# Patient Record
Sex: Male | Born: 1950
Health system: Southern US, Community
[De-identification: ages and names within clinical notes are randomized; demographics above are authoritative.]

## PROBLEM LIST (undated history)

## (undated) DIAGNOSIS — F418 Other specified anxiety disorders: Secondary | ICD-10-CM

## (undated) DIAGNOSIS — K760 Fatty (change of) liver, not elsewhere classified: Secondary | ICD-10-CM

## (undated) DIAGNOSIS — M1990 Unspecified osteoarthritis, unspecified site: Secondary | ICD-10-CM

## (undated) DIAGNOSIS — Z5189 Encounter for other specified aftercare: Secondary | ICD-10-CM

## (undated) DIAGNOSIS — G4730 Sleep apnea, unspecified: Secondary | ICD-10-CM

## (undated) DIAGNOSIS — Z8719 Personal history of other diseases of the digestive system: Secondary | ICD-10-CM

## (undated) DIAGNOSIS — Z86010 Personal history of colonic polyps: Secondary | ICD-10-CM

## (undated) DIAGNOSIS — E039 Hypothyroidism, unspecified: Secondary | ICD-10-CM

## (undated) DIAGNOSIS — K922 Gastrointestinal hemorrhage, unspecified: Secondary | ICD-10-CM

## (undated) DIAGNOSIS — I219 Acute myocardial infarction, unspecified: Secondary | ICD-10-CM

## (undated) DIAGNOSIS — J45909 Unspecified asthma, uncomplicated: Secondary | ICD-10-CM

## (undated) DIAGNOSIS — R748 Abnormal levels of other serum enzymes: Secondary | ICD-10-CM

## (undated) DIAGNOSIS — Z972 Presence of dental prosthetic device (complete) (partial): Secondary | ICD-10-CM

## (undated) DIAGNOSIS — I639 Cerebral infarction, unspecified: Secondary | ICD-10-CM

## (undated) DIAGNOSIS — T7840XA Allergy, unspecified, initial encounter: Secondary | ICD-10-CM

## (undated) DIAGNOSIS — K219 Gastro-esophageal reflux disease without esophagitis: Secondary | ICD-10-CM

## (undated) DIAGNOSIS — S02400A Malar fracture unspecified, initial encounter for closed fracture: Secondary | ICD-10-CM

## (undated) DIAGNOSIS — Z8711 Personal history of peptic ulcer disease: Secondary | ICD-10-CM

## (undated) DIAGNOSIS — K08109 Complete loss of teeth, unspecified cause, unspecified class: Secondary | ICD-10-CM

## (undated) DIAGNOSIS — I4891 Unspecified atrial fibrillation: Secondary | ICD-10-CM

## (undated) DIAGNOSIS — R569 Unspecified convulsions: Secondary | ICD-10-CM

## (undated) DIAGNOSIS — L259 Unspecified contact dermatitis, unspecified cause: Secondary | ICD-10-CM

## (undated) DIAGNOSIS — F419 Anxiety disorder, unspecified: Secondary | ICD-10-CM

## (undated) DIAGNOSIS — A4902 Methicillin resistant Staphylococcus aureus infection, unspecified site: Secondary | ICD-10-CM

## (undated) DIAGNOSIS — K2980 Duodenitis without bleeding: Secondary | ICD-10-CM

## (undated) DIAGNOSIS — Q2112 Patent foramen ovale: Secondary | ICD-10-CM

## (undated) DIAGNOSIS — I509 Heart failure, unspecified: Secondary | ICD-10-CM

## (undated) DIAGNOSIS — Q211 Atrial septal defect: Secondary | ICD-10-CM

## (undated) DIAGNOSIS — J189 Pneumonia, unspecified organism: Secondary | ICD-10-CM

## (undated) DIAGNOSIS — A0472 Enterocolitis due to Clostridium difficile, not specified as recurrent: Secondary | ICD-10-CM

## (undated) DIAGNOSIS — Z973 Presence of spectacles and contact lenses: Secondary | ICD-10-CM

## (undated) DIAGNOSIS — IMO0001 Reserved for inherently not codable concepts without codable children: Secondary | ICD-10-CM

## (undated) DIAGNOSIS — S02401A Maxillary fracture, unspecified, initial encounter for closed fracture: Secondary | ICD-10-CM

## (undated) HISTORY — DX: Unspecified convulsions: R56.9

## (undated) HISTORY — DX: Maxillary fracture, unspecified side, initial encounter for closed fracture: S02.401A

## (undated) HISTORY — PX: CATARACT EXTRACTION: SUR2

## (undated) HISTORY — DX: Enterocolitis due to Clostridium difficile, not specified as recurrent: A04.72

## (undated) HISTORY — DX: Unspecified asthma, uncomplicated: J45.909

## (undated) HISTORY — DX: Anxiety disorder, unspecified: F41.9

## (undated) HISTORY — DX: Hypothyroidism, unspecified: E03.9

## (undated) HISTORY — PX: OTHER SURGICAL HISTORY: SHX169

## (undated) HISTORY — DX: Unspecified contact dermatitis, unspecified cause: L25.9

## (undated) HISTORY — DX: Acute myocardial infarction, unspecified: I21.9

## (undated) HISTORY — PX: TONSILLECTOMY: SUR1361

## (undated) HISTORY — PX: REPLACEMENT TOTAL KNEE: SUR1224

## (undated) HISTORY — DX: Other specified anxiety disorders: F41.8

## (undated) HISTORY — DX: Cerebral infarction, unspecified: I63.9

## (undated) HISTORY — PX: MULTIPLE TOOTH EXTRACTIONS: SHX2053

## (undated) HISTORY — DX: Duodenitis without bleeding: K29.80

## (undated) HISTORY — DX: Malar fracture, unspecified side, initial encounter for closed fracture: S02.400A

## (undated) HISTORY — PX: JOINT REPLACEMENT: SHX530

## (undated) HISTORY — DX: Unspecified atrial fibrillation: I48.91

## (undated) HISTORY — DX: Allergy, unspecified, initial encounter: T78.40XA

## (undated) HISTORY — DX: Heart failure, unspecified: I50.9

## (undated) HISTORY — PX: INGUINAL HERNIA REPAIR: SUR1180

## (undated) HISTORY — DX: Personal history of colonic polyps: Z86.010

---

## 1998-06-22 ENCOUNTER — Ambulatory Visit (HOSPITAL_COMMUNITY): Admission: RE | Admit: 1998-06-22 | Discharge: 1998-06-22 | Payer: Self-pay | Admitting: Family Medicine

## 1999-10-30 ENCOUNTER — Encounter: Payer: Self-pay | Admitting: Family Medicine

## 1999-10-30 ENCOUNTER — Ambulatory Visit (HOSPITAL_COMMUNITY): Admission: RE | Admit: 1999-10-30 | Discharge: 1999-10-30 | Payer: Self-pay | Admitting: Family Medicine

## 1999-11-03 ENCOUNTER — Encounter: Payer: Self-pay | Admitting: Family Medicine

## 1999-11-03 ENCOUNTER — Ambulatory Visit (HOSPITAL_COMMUNITY): Admission: RE | Admit: 1999-11-03 | Discharge: 1999-11-03 | Payer: Self-pay | Admitting: Family Medicine

## 2001-09-18 ENCOUNTER — Inpatient Hospital Stay (HOSPITAL_COMMUNITY): Admission: RE | Admit: 2001-09-18 | Discharge: 2001-09-23 | Payer: Self-pay | Admitting: Orthopedic Surgery

## 2001-09-18 ENCOUNTER — Encounter: Payer: Self-pay | Admitting: Orthopedic Surgery

## 2001-10-15 HISTORY — PX: KNEE ARTHROSCOPY: SHX127

## 2001-11-04 ENCOUNTER — Inpatient Hospital Stay (HOSPITAL_COMMUNITY): Admission: RE | Admit: 2001-11-04 | Discharge: 2001-11-06 | Payer: Self-pay | Admitting: Orthopedic Surgery

## 2001-11-04 ENCOUNTER — Encounter: Payer: Self-pay | Admitting: Orthopedic Surgery

## 2001-11-07 ENCOUNTER — Encounter: Payer: Self-pay | Admitting: Emergency Medicine

## 2001-11-07 ENCOUNTER — Emergency Department (HOSPITAL_COMMUNITY): Admission: EM | Admit: 2001-11-07 | Discharge: 2001-11-07 | Payer: Self-pay | Admitting: Emergency Medicine

## 2001-11-07 ENCOUNTER — Encounter: Payer: Self-pay | Admitting: Orthopedic Surgery

## 2003-05-10 ENCOUNTER — Ambulatory Visit (HOSPITAL_COMMUNITY): Admission: RE | Admit: 2003-05-10 | Discharge: 2003-05-10 | Payer: Self-pay | Admitting: *Deleted

## 2003-05-10 ENCOUNTER — Encounter: Payer: Self-pay | Admitting: *Deleted

## 2003-06-01 ENCOUNTER — Ambulatory Visit (HOSPITAL_COMMUNITY): Admission: RE | Admit: 2003-06-01 | Discharge: 2003-06-01 | Payer: Self-pay | Admitting: Internal Medicine

## 2003-06-01 ENCOUNTER — Encounter (INDEPENDENT_AMBULATORY_CARE_PROVIDER_SITE_OTHER): Payer: Self-pay

## 2003-06-01 DIAGNOSIS — Z860100 Personal history of colon polyps, unspecified: Secondary | ICD-10-CM

## 2003-06-01 HISTORY — DX: Personal history of colonic polyps: Z86.010

## 2003-06-01 HISTORY — DX: Personal history of colon polyps, unspecified: Z86.0100

## 2004-12-07 ENCOUNTER — Emergency Department (HOSPITAL_COMMUNITY): Admission: EM | Admit: 2004-12-07 | Discharge: 2004-12-07 | Payer: Self-pay | Admitting: Family Medicine

## 2004-12-20 ENCOUNTER — Emergency Department (HOSPITAL_COMMUNITY): Admission: EM | Admit: 2004-12-20 | Discharge: 2004-12-20 | Payer: Self-pay | Admitting: Family Medicine

## 2005-02-28 ENCOUNTER — Emergency Department (HOSPITAL_COMMUNITY): Admission: EM | Admit: 2005-02-28 | Discharge: 2005-02-28 | Payer: Self-pay | Admitting: Family Medicine

## 2005-06-11 ENCOUNTER — Emergency Department (HOSPITAL_COMMUNITY): Admission: EM | Admit: 2005-06-11 | Discharge: 2005-06-11 | Payer: Self-pay | Admitting: Emergency Medicine

## 2005-06-15 HISTORY — PX: SYNOVECTOMY: SHX133

## 2005-06-24 ENCOUNTER — Emergency Department (HOSPITAL_COMMUNITY): Admission: EM | Admit: 2005-06-24 | Discharge: 2005-06-24 | Payer: Self-pay | Admitting: Emergency Medicine

## 2005-07-03 ENCOUNTER — Ambulatory Visit: Payer: Self-pay | Admitting: Infectious Diseases

## 2005-07-03 ENCOUNTER — Inpatient Hospital Stay (HOSPITAL_COMMUNITY): Admission: AD | Admit: 2005-07-03 | Discharge: 2005-07-09 | Payer: Self-pay | Admitting: Orthopaedic Surgery

## 2005-12-06 ENCOUNTER — Encounter: Admission: RE | Admit: 2005-12-06 | Discharge: 2006-03-06 | Payer: Self-pay | Admitting: Orthopedic Surgery

## 2006-01-30 ENCOUNTER — Ambulatory Visit: Payer: Self-pay | Admitting: Internal Medicine

## 2006-01-31 ENCOUNTER — Ambulatory Visit (HOSPITAL_COMMUNITY): Admission: RE | Admit: 2006-01-31 | Discharge: 2006-01-31 | Payer: Self-pay | Admitting: Internal Medicine

## 2006-02-28 ENCOUNTER — Ambulatory Visit: Payer: Self-pay | Admitting: Internal Medicine

## 2006-04-05 ENCOUNTER — Inpatient Hospital Stay (HOSPITAL_COMMUNITY): Admission: RE | Admit: 2006-04-05 | Discharge: 2006-04-11 | Payer: Self-pay | Admitting: Orthopedic Surgery

## 2006-04-08 ENCOUNTER — Ambulatory Visit: Payer: Self-pay | Admitting: Infectious Diseases

## 2006-04-10 ENCOUNTER — Ambulatory Visit: Payer: Self-pay | Admitting: Physical Medicine & Rehabilitation

## 2006-04-11 ENCOUNTER — Inpatient Hospital Stay (HOSPITAL_COMMUNITY)
Admission: RE | Admit: 2006-04-11 | Discharge: 2006-04-16 | Payer: Self-pay | Admitting: Physical Medicine & Rehabilitation

## 2006-04-14 HISTORY — PX: HARDWARE REMOVAL: SHX979

## 2006-05-30 ENCOUNTER — Ambulatory Visit: Payer: Self-pay | Admitting: Internal Medicine

## 2006-06-25 ENCOUNTER — Encounter (INDEPENDENT_AMBULATORY_CARE_PROVIDER_SITE_OTHER): Payer: Self-pay | Admitting: *Deleted

## 2006-06-25 ENCOUNTER — Inpatient Hospital Stay (HOSPITAL_COMMUNITY): Admission: RE | Admit: 2006-06-25 | Discharge: 2006-07-01 | Payer: Self-pay | Admitting: Orthopedic Surgery

## 2006-06-26 ENCOUNTER — Ambulatory Visit: Payer: Self-pay | Admitting: Physical Medicine & Rehabilitation

## 2006-07-22 ENCOUNTER — Encounter: Admission: RE | Admit: 2006-07-22 | Discharge: 2006-10-20 | Payer: Self-pay | Admitting: Orthopedic Surgery

## 2006-10-11 ENCOUNTER — Ambulatory Visit: Payer: Self-pay | Admitting: Internal Medicine

## 2006-10-16 ENCOUNTER — Ambulatory Visit: Payer: Self-pay | Admitting: *Deleted

## 2006-11-08 ENCOUNTER — Ambulatory Visit: Payer: Self-pay | Admitting: Internal Medicine

## 2006-11-19 ENCOUNTER — Ambulatory Visit: Payer: Self-pay | Admitting: Internal Medicine

## 2007-06-09 DIAGNOSIS — M1990 Unspecified osteoarthritis, unspecified site: Secondary | ICD-10-CM | POA: Insufficient documentation

## 2007-06-09 DIAGNOSIS — M19011 Primary osteoarthritis, right shoulder: Secondary | ICD-10-CM | POA: Insufficient documentation

## 2007-07-02 ENCOUNTER — Encounter (INDEPENDENT_AMBULATORY_CARE_PROVIDER_SITE_OTHER): Payer: Self-pay | Admitting: *Deleted

## 2007-08-15 ENCOUNTER — Encounter: Admission: RE | Admit: 2007-08-15 | Discharge: 2007-08-15 | Payer: Self-pay | Admitting: Orthopaedic Surgery

## 2007-10-15 ENCOUNTER — Ambulatory Visit: Payer: Self-pay | Admitting: Internal Medicine

## 2007-10-15 LAB — CONVERTED CEMR LAB
BUN: 11 mg/dL (ref 6–23)
Basophils Relative: 1 % (ref 0–1)
Calcium: 9.3 mg/dL (ref 8.4–10.5)
Cholesterol: 150 mg/dL (ref 0–200)
Eosinophils Absolute: 0.3 10*3/uL (ref 0.0–0.7)
HDL: 49 mg/dL (ref >39)
Hgb A1c MFr Bld: 5.3 % (ref 4.6–6.1)
LDL Cholesterol: 77 mg/dL (ref 0–99)
MCHC: 33.6 g/dL (ref 30.0–36.0)
MCV: 91.2 fL (ref 78.0–100.0)
Monocytes Relative: 13 % — ABNORMAL HIGH (ref 3–12)
Neutrophils Relative %: 68 % (ref 43–77)
Platelets: 297 10*3/uL (ref 150–400)
Potassium: 4.2 meq/L (ref 3.5–5.3)
Sodium: 140 meq/L (ref 135–145)
Total CHOL/HDL Ratio: 3.1
Triglycerides: 118 mg/dL (ref <150)

## 2007-10-16 DIAGNOSIS — R569 Unspecified convulsions: Secondary | ICD-10-CM

## 2007-10-16 HISTORY — DX: Unspecified convulsions: R56.9

## 2007-11-11 ENCOUNTER — Ambulatory Visit: Payer: Self-pay | Admitting: Internal Medicine

## 2008-02-10 ENCOUNTER — Ambulatory Visit: Payer: Self-pay | Admitting: Internal Medicine

## 2008-03-02 ENCOUNTER — Ambulatory Visit: Payer: Self-pay | Admitting: Internal Medicine

## 2008-03-19 ENCOUNTER — Ambulatory Visit: Payer: Self-pay | Admitting: Internal Medicine

## 2008-04-09 ENCOUNTER — Ambulatory Visit: Payer: Self-pay | Admitting: Internal Medicine

## 2008-04-09 LAB — CONVERTED CEMR LAB
BUN: 11 mg/dL (ref 6–23)
Calcium: 8.5 mg/dL (ref 8.4–10.5)
Glucose, Bld: 80 mg/dL (ref 70–99)
Sodium: 138 meq/L (ref 135–145)

## 2008-05-11 ENCOUNTER — Ambulatory Visit: Payer: Self-pay | Admitting: Internal Medicine

## 2008-08-15 DIAGNOSIS — J189 Pneumonia, unspecified organism: Secondary | ICD-10-CM

## 2008-08-15 HISTORY — PX: ARTHROTOMY: SHX134

## 2008-08-15 HISTORY — PX: TOE AMPUTATION: SHX809

## 2008-08-15 HISTORY — DX: Pneumonia, unspecified organism: J18.9

## 2008-08-29 ENCOUNTER — Ambulatory Visit: Payer: Self-pay | Admitting: Cardiology

## 2008-08-29 ENCOUNTER — Inpatient Hospital Stay (HOSPITAL_COMMUNITY): Admission: EM | Admit: 2008-08-29 | Discharge: 2008-09-29 | Payer: Self-pay | Admitting: Emergency Medicine

## 2008-08-29 ENCOUNTER — Ambulatory Visit: Payer: Self-pay | Admitting: Pulmonary Disease

## 2008-08-30 ENCOUNTER — Encounter (INDEPENDENT_AMBULATORY_CARE_PROVIDER_SITE_OTHER): Payer: Self-pay | Admitting: Internal Medicine

## 2008-08-30 ENCOUNTER — Ambulatory Visit: Payer: Self-pay | Admitting: *Deleted

## 2008-09-02 ENCOUNTER — Encounter: Payer: Self-pay | Admitting: Cardiology

## 2008-09-05 ENCOUNTER — Encounter (INDEPENDENT_AMBULATORY_CARE_PROVIDER_SITE_OTHER): Payer: Self-pay | Admitting: Internal Medicine

## 2008-09-06 ENCOUNTER — Ambulatory Visit: Payer: Self-pay | Admitting: Internal Medicine

## 2008-09-07 ENCOUNTER — Encounter (INDEPENDENT_AMBULATORY_CARE_PROVIDER_SITE_OTHER): Payer: Self-pay | Admitting: Orthopedic Surgery

## 2008-09-18 ENCOUNTER — Encounter (INDEPENDENT_AMBULATORY_CARE_PROVIDER_SITE_OTHER): Payer: Self-pay | Admitting: Internal Medicine

## 2008-09-27 ENCOUNTER — Ambulatory Visit: Payer: Self-pay | Admitting: Physical Medicine & Rehabilitation

## 2008-10-15 DIAGNOSIS — I639 Cerebral infarction, unspecified: Secondary | ICD-10-CM

## 2008-10-15 HISTORY — DX: Cerebral infarction, unspecified: I63.9

## 2008-10-20 ENCOUNTER — Ambulatory Visit (HOSPITAL_COMMUNITY): Admission: RE | Admit: 2008-10-20 | Discharge: 2008-10-20 | Payer: Self-pay | Admitting: Internal Medicine

## 2008-11-21 ENCOUNTER — Emergency Department (HOSPITAL_COMMUNITY): Admission: EM | Admit: 2008-11-21 | Discharge: 2008-11-21 | Payer: Self-pay | Admitting: Family Medicine

## 2008-11-23 ENCOUNTER — Inpatient Hospital Stay (HOSPITAL_COMMUNITY): Admission: EM | Admit: 2008-11-23 | Discharge: 2008-11-30 | Payer: Self-pay | Admitting: Emergency Medicine

## 2008-12-07 ENCOUNTER — Inpatient Hospital Stay (HOSPITAL_COMMUNITY): Admission: RE | Admit: 2008-12-07 | Discharge: 2008-12-14 | Payer: Self-pay | Admitting: Orthopedic Surgery

## 2008-12-09 ENCOUNTER — Encounter (INDEPENDENT_AMBULATORY_CARE_PROVIDER_SITE_OTHER): Payer: Self-pay | Admitting: Gastroenterology

## 2008-12-11 ENCOUNTER — Ambulatory Visit: Payer: Self-pay | Admitting: Infectious Diseases

## 2009-03-15 HISTORY — PX: OTHER SURGICAL HISTORY: SHX169

## 2009-03-15 HISTORY — PX: KNEE FUSION: SUR623

## 2009-03-22 ENCOUNTER — Ambulatory Visit: Payer: Self-pay | Admitting: Internal Medicine

## 2009-03-29 ENCOUNTER — Ambulatory Visit: Payer: Self-pay | Admitting: Pulmonary Disease

## 2009-03-29 ENCOUNTER — Inpatient Hospital Stay (HOSPITAL_COMMUNITY): Admission: RE | Admit: 2009-03-29 | Discharge: 2009-04-03 | Payer: Self-pay | Admitting: Orthopedic Surgery

## 2009-04-21 ENCOUNTER — Ambulatory Visit: Payer: Self-pay | Admitting: Internal Medicine

## 2009-04-21 LAB — CONVERTED CEMR LAB
ALT: 8 units/L (ref 0–53)
AST: 11 units/L (ref 0–37)
Albumin: 3.9 g/dL (ref 3.5–5.2)
Alkaline Phosphatase: 81 units/L (ref 39–117)
Lymphocytes Relative: 22 % (ref 12–46)
Lymphs Abs: 1.4 10*3/uL (ref 0.7–4.0)
Neutrophils Relative %: 64 % (ref 43–77)
Platelets: 422 10*3/uL — ABNORMAL HIGH (ref 150–400)
Potassium: 4.5 meq/L (ref 3.5–5.3)
RBC: 3.77 M/uL — ABNORMAL LOW (ref 4.22–5.81)
Sed Rate: 44 mm/hr — ABNORMAL HIGH (ref 0–16)
Sodium: 139 meq/L (ref 135–145)
Total Protein: 7.3 g/dL (ref 6.0–8.3)
WBC: 6.3 10*3/uL (ref 4.0–10.5)

## 2009-04-26 ENCOUNTER — Ambulatory Visit: Payer: Self-pay | Admitting: Internal Medicine

## 2009-07-12 ENCOUNTER — Telehealth (INDEPENDENT_AMBULATORY_CARE_PROVIDER_SITE_OTHER): Payer: Self-pay | Admitting: *Deleted

## 2009-07-12 ENCOUNTER — Ambulatory Visit: Payer: Self-pay | Admitting: Internal Medicine

## 2009-07-15 HISTORY — PX: OTHER SURGICAL HISTORY: SHX169

## 2009-07-19 ENCOUNTER — Ambulatory Visit (HOSPITAL_COMMUNITY): Admission: RE | Admit: 2009-07-19 | Discharge: 2009-07-19 | Payer: Self-pay | Admitting: Orthopedic Surgery

## 2009-10-18 ENCOUNTER — Ambulatory Visit: Payer: Self-pay | Admitting: Internal Medicine

## 2009-10-18 ENCOUNTER — Ambulatory Visit (HOSPITAL_COMMUNITY): Admission: RE | Admit: 2009-10-18 | Discharge: 2009-10-18 | Payer: Self-pay | Admitting: Internal Medicine

## 2009-10-18 LAB — CONVERTED CEMR LAB
Basophils Relative: 1 % (ref 0–1)
HCT: 42.4 % (ref 39.0–52.0)
Hemoglobin: 14.1 g/dL (ref 13.0–17.0)
MCHC: 33.3 g/dL (ref 30.0–36.0)
Monocytes Absolute: 0.9 10*3/uL (ref 0.1–1.0)
Monocytes Relative: 11 % (ref 3–12)
Neutro Abs: 6 10*3/uL (ref 1.7–7.7)
RBC: 4.62 M/uL (ref 4.22–5.81)
Retic Ct Pct: 1.3 % (ref 0.4–3.1)
TIBC: 279 ug/dL (ref 215–435)

## 2010-01-17 ENCOUNTER — Encounter: Admission: RE | Admit: 2010-01-17 | Discharge: 2010-01-17 | Payer: Self-pay | Admitting: Orthopaedic Surgery

## 2010-02-06 ENCOUNTER — Encounter: Admission: RE | Admit: 2010-02-06 | Discharge: 2010-03-20 | Payer: Self-pay | Admitting: Orthopaedic Surgery

## 2010-02-22 ENCOUNTER — Ambulatory Visit: Payer: Self-pay | Admitting: Internal Medicine

## 2010-02-28 ENCOUNTER — Encounter: Admission: RE | Admit: 2010-02-28 | Discharge: 2010-02-28 | Payer: Self-pay | Admitting: Internal Medicine

## 2010-06-26 ENCOUNTER — Encounter (INDEPENDENT_AMBULATORY_CARE_PROVIDER_SITE_OTHER): Payer: Self-pay | Admitting: *Deleted

## 2010-07-04 ENCOUNTER — Ambulatory Visit: Payer: Self-pay | Admitting: Internal Medicine

## 2010-07-04 DIAGNOSIS — IMO0002 Reserved for concepts with insufficient information to code with codable children: Secondary | ICD-10-CM | POA: Insufficient documentation

## 2010-07-04 DIAGNOSIS — Z8614 Personal history of Methicillin resistant Staphylococcus aureus infection: Secondary | ICD-10-CM

## 2010-07-04 DIAGNOSIS — A4901 Methicillin susceptible Staphylococcus aureus infection, unspecified site: Secondary | ICD-10-CM | POA: Insufficient documentation

## 2010-07-04 DIAGNOSIS — M7020 Olecranon bursitis, unspecified elbow: Secondary | ICD-10-CM | POA: Insufficient documentation

## 2010-07-04 DIAGNOSIS — M9979 Connective tissue and disc stenosis of intervertebral foramina of abdomen and other regions: Secondary | ICD-10-CM | POA: Insufficient documentation

## 2010-07-04 DIAGNOSIS — G5620 Lesion of ulnar nerve, unspecified upper limb: Secondary | ICD-10-CM | POA: Insufficient documentation

## 2010-07-04 DIAGNOSIS — L259 Unspecified contact dermatitis, unspecified cause: Secondary | ICD-10-CM | POA: Insufficient documentation

## 2010-07-04 DIAGNOSIS — S02400A Malar fracture unspecified, initial encounter for closed fracture: Secondary | ICD-10-CM | POA: Insufficient documentation

## 2010-07-04 DIAGNOSIS — A0472 Enterocolitis due to Clostridium difficile, not specified as recurrent: Secondary | ICD-10-CM

## 2010-07-04 DIAGNOSIS — Z96651 Presence of right artificial knee joint: Secondary | ICD-10-CM

## 2010-07-04 DIAGNOSIS — M5136 Other intervertebral disc degeneration, lumbar region: Secondary | ICD-10-CM | POA: Insufficient documentation

## 2010-07-04 DIAGNOSIS — Z9660 Presence of unspecified orthopedic joint implant: Secondary | ICD-10-CM | POA: Insufficient documentation

## 2010-07-04 DIAGNOSIS — S02401A Maxillary fracture, unspecified, initial encounter for closed fracture: Secondary | ICD-10-CM

## 2010-07-04 HISTORY — DX: Unspecified contact dermatitis, unspecified cause: L25.9

## 2010-07-04 HISTORY — DX: Enterocolitis due to Clostridium difficile, not specified as recurrent: A04.72

## 2010-07-04 HISTORY — DX: Maxillary fracture, unspecified side, initial encounter for closed fracture: S02.401A

## 2010-07-04 HISTORY — DX: Malar fracture, unspecified side, initial encounter for closed fracture: S02.400A

## 2010-07-13 ENCOUNTER — Encounter (INDEPENDENT_AMBULATORY_CARE_PROVIDER_SITE_OTHER): Payer: Self-pay | Admitting: Internal Medicine

## 2010-07-13 LAB — CONVERTED CEMR LAB
BUN: 12 mg/dL (ref 6–23)
CO2: 23 meq/L (ref 19–32)
Creatinine, Ser: 1.38 mg/dL (ref 0.40–1.50)
Eosinophils Relative: 7 % — ABNORMAL HIGH (ref 0–5)
Glucose, Bld: 91 mg/dL (ref 70–99)
HCT: 44.2 % (ref 39.0–52.0)
Hemoglobin: 14.5 g/dL (ref 13.0–17.0)
Hgb A1c MFr Bld: 5.6 % (ref <5.7)
Lymphocytes Relative: 18 % (ref 12–46)
Lymphs Abs: 1.4 10*3/uL (ref 0.7–4.0)
Monocytes Absolute: 1 10*3/uL (ref 0.1–1.0)
RDW: 14.3 % (ref 11.5–15.5)
Total Bilirubin: 0.9 mg/dL (ref 0.3–1.2)
WBC: 7.6 10*3/uL (ref 4.0–10.5)

## 2010-11-05 ENCOUNTER — Encounter: Payer: Self-pay | Admitting: Orthopaedic Surgery

## 2010-11-05 ENCOUNTER — Encounter: Payer: Self-pay | Admitting: Internal Medicine

## 2010-11-14 NOTE — Assessment & Plan Note (Signed)
Summary: new pt history of mrsa   History of Present Illness: [name] is a 60 year old who was referred to be by Dr. Freddrick March to discuss risk of postoperative infection given his history of previous staph infections in his right knee.  He has extensive degenerative arthritis and underwent right total knee arthroplasty in 2002.  He developed arthrofibrosis and underwent manipulation under anesthesia in 2003.  It appears he developed a methicillin sensitive staph aureus joint infection in 2006.  This was initially managed with synovectomy and poly-exchange but eventually required excision arthroplasty and spacer placement.  At some point he underwent a redo arthroplasty but had persistent infection again requiring removal of the prosthesis.  In June of last year he underwent right leg effusion.  He's had no evidence of infection since that time.  He says that he is very unhappy not been able to bend his leg and has even considered above the knee amputation so that he could be fitted with a prosthesis.  He is also considering the lower back and neck surgery but is concerned about his risk of infection.   Updated Prior Medication List: No Medications Current Allergies (reviewed today): No known allergies  Vital Signs:  Patient profile:   60 year old male Height:      74 inches (187.96 cm) Weight:      255.8 pounds (116.27 kg) BMI:     32.96 Temp:     97.6 degrees F (36.44 degrees C) oral Pulse rate:   90 / minute BP sitting:   139 / 87  (left arm) Cuff size:   large  Vitals Entered By: Jennet Maduro RN (July 04, 2010 2:07 PM)  Physical Exam  General:  alert and overweight-appearing.   Mouth:  pharynx pink and moist, no erythema, and no exudates.   Lungs:  normal breath sounds, no crackles, and no wheezes.   Heart:  normal rate, regular rhythm, and no murmur.   Abdomen:  soft, non-tender, normal bowel sounds, no hepatomegaly, and no splenomegaly.   Extremities:  he has an  extensive healed surgical incision on his anterior right leg.  His knee is fused.  There is no sign of active inflammation or infection. Skin:  he has a chronic red scaly rash on his for head and nose which he says is typical of his eczema.   Impression & Recommendations:  Problem # 1:  METHICILLIN SUSCEPTIBLE STAPH INF CCE & UNS SITE (ICD-041.11) I do not think that he has any underlying immunosuppressive disorder that puts him at unusual risk for infection.  Any surgery that he might consider in the future will carry some small risk of infection.  It may be while trying to manage his eczema more aggressively as this may predispose him to staph infections.  Otherwise I would manage him just as any other perioperative patient with standard prophylaxis, skin care and surgical technique. Orders: Consultation Level III ([id number])  Patient Instructions: 1)  Please schedule a follow-up appointment as needed.

## 2010-11-14 NOTE — Miscellaneous (Signed)
Summary: Problems updated  Clinical Lists Changes  Problems: Added new problem of MRSA (ICD-041.12) - HX of

## 2010-11-14 NOTE — Consult Note (Signed)
Summary: New Pt. Referral: Dr Laqueta Linden  New Pt. Referral: Dr Laqueta Linden   Imported By: Florinda Marker 07/10/2010 11:21:37  _____________________________________________________________________  External Attachment:    Type:   Image     Comment:   External Document

## 2011-01-18 LAB — GLUCOSE, CAPILLARY: Glucose-Capillary: 77 mg/dL (ref 70–99)

## 2011-01-19 LAB — CBC
MCHC: 34.4 g/dL (ref 30.0–36.0)
Platelets: 304 10*3/uL (ref 150–400)
RBC: 4.44 MIL/uL (ref 4.22–5.81)
WBC: 8.9 10*3/uL (ref 4.0–10.5)

## 2011-01-19 LAB — URINALYSIS, ROUTINE W REFLEX MICROSCOPIC
Glucose, UA: NEGATIVE mg/dL
Nitrite: NEGATIVE
Protein, ur: NEGATIVE mg/dL
pH: 5 (ref 5.0–8.0)

## 2011-01-19 LAB — BASIC METABOLIC PANEL
Calcium: 9.8 mg/dL (ref 8.4–10.5)
Creatinine, Ser: 1.39 mg/dL (ref 0.4–1.5)
GFR calc Af Amer: >60 mL/min (ref >60)

## 2011-01-19 LAB — URINE MICROSCOPIC-ADD ON

## 2011-01-22 LAB — TYPE AND SCREEN

## 2011-01-22 LAB — D-DIMER, QUANTITATIVE: D-Dimer, Quant: 19.06 ug/mL-FEU — ABNORMAL HIGH (ref 0.00–0.48)

## 2011-01-22 LAB — GLUCOSE, CAPILLARY
Glucose-Capillary: 103 mg/dL — ABNORMAL HIGH (ref 70–99)
Glucose-Capillary: 105 mg/dL — ABNORMAL HIGH (ref 70–99)
Glucose-Capillary: 118 mg/dL — ABNORMAL HIGH (ref 70–99)
Glucose-Capillary: 201 mg/dL — ABNORMAL HIGH (ref 70–99)
Glucose-Capillary: 69 mg/dL — ABNORMAL LOW (ref 70–99)
Glucose-Capillary: 84 mg/dL (ref 70–99)
Glucose-Capillary: 88 mg/dL (ref 70–99)
Glucose-Capillary: 89 mg/dL (ref 70–99)
Glucose-Capillary: 89 mg/dL (ref 70–99)
Glucose-Capillary: 90 mg/dL (ref 70–99)
Glucose-Capillary: 94 mg/dL (ref 70–99)
Glucose-Capillary: 95 mg/dL (ref 70–99)
Glucose-Capillary: 96 mg/dL (ref 70–99)
Glucose-Capillary: 98 mg/dL (ref 70–99)
Glucose-Capillary: 99 mg/dL (ref 70–99)

## 2011-01-22 LAB — CBC
HCT: 21.1 % — ABNORMAL LOW (ref 39.0–52.0)
HCT: 21.2 % — ABNORMAL LOW (ref 39.0–52.0)
Hemoglobin: 7.2 g/dL — CL (ref 13.0–17.0)
Hemoglobin: 7.2 g/dL — CL (ref 13.0–17.0)
Hemoglobin: 7.9 g/dL — CL (ref 13.0–17.0)
MCHC: 34 g/dL (ref 30.0–36.0)
MCHC: 34.5 g/dL (ref 30.0–36.0)
MCHC: 34.5 g/dL (ref 30.0–36.0)
MCV: 90.7 fL (ref 78.0–100.0)
MCV: 91 fL (ref 78.0–100.0)
MCV: 89.8 fL (ref 78.0–100.0)
Platelets: 198 10*3/uL (ref 150–400)
Platelets: 217 10*3/uL (ref 150–400)
Platelets: 335 10*3/uL (ref 150–400)
RBC: 2.32 MIL/uL — ABNORMAL LOW (ref 4.22–5.81)
RBC: 2.55 MIL/uL — ABNORMAL LOW (ref 4.22–5.81)
RBC: 2.57 MIL/uL — ABNORMAL LOW (ref 4.22–5.81)
RDW: 15.1 % (ref 11.5–15.5)
RDW: 15.5 % (ref 11.5–15.5)
RDW: 14.9 % (ref 11.5–15.5)
RDW: 15.3 % (ref 11.5–15.5)
WBC: 6.9 10*3/uL (ref 4.0–10.5)
WBC: 7.2 10*3/uL (ref 4.0–10.5)

## 2011-01-22 LAB — BASIC METABOLIC PANEL
BUN: 24 mg/dL — ABNORMAL HIGH (ref 6–23)
CO2: 26 mEq/L (ref 19–32)
CO2: 26 mEq/L (ref 19–32)
Calcium: 7.6 mg/dL — ABNORMAL LOW (ref 8.4–10.5)
Calcium: 7.9 mg/dL — ABNORMAL LOW (ref 8.4–10.5)
Calcium: 8 mg/dL — ABNORMAL LOW (ref 8.4–10.5)
Chloride: 109 mEq/L (ref 96–112)
Chloride: 111 mEq/L (ref 96–112)
Creatinine, Ser: 1.13 mg/dL (ref 0.4–1.5)
Creatinine, Ser: 1.33 mg/dL (ref 0.4–1.5)
GFR calc Af Amer: >60 mL/min (ref >60)
GFR calc Af Amer: >60 mL/min (ref >60)
GFR calc non Af Amer: >60 mL/min (ref >60)
GFR calc non Af Amer: >60 mL/min (ref >60)
Glucose, Bld: 111 mg/dL — ABNORMAL HIGH (ref 70–99)
Glucose, Bld: 96 mg/dL (ref 70–99)
Potassium: 3.7 mEq/L (ref 3.5–5.1)
Potassium: 4.1 mEq/L (ref 3.5–5.1)
Sodium: 139 mEq/L (ref 135–145)
Sodium: 141 mEq/L (ref 135–145)

## 2011-01-22 LAB — COMPREHENSIVE METABOLIC PANEL
ALT: 12 U/L (ref 0–53)
AST: 14 U/L (ref 0–37)
Albumin: 3.5 g/dL (ref 3.5–5.2)
Alkaline Phosphatase: 61 U/L (ref 39–117)
BUN: 24 mg/dL — ABNORMAL HIGH (ref 6–23)
CO2: 27 mEq/L (ref 19–32)
Calcium: 8.4 mg/dL (ref 8.4–10.5)
Chloride: 106 mEq/L (ref 96–112)
Creatinine, Ser: 1.14 mg/dL (ref 0.4–1.5)
GFR calc Af Amer: >60 mL/min (ref >60)
GFR calc non Af Amer: >60 mL/min (ref >60)
Glucose, Bld: 163 mg/dL — ABNORMAL HIGH (ref 70–99)
Potassium: 3.9 mEq/L (ref 3.5–5.1)
Total Bilirubin: 0.8 mg/dL (ref 0.3–1.2)
Total Protein: 7 g/dL (ref 6.0–8.3)

## 2011-01-22 LAB — URINALYSIS, ROUTINE W REFLEX MICROSCOPIC
Glucose, UA: NEGATIVE mg/dL
Hgb urine dipstick: NEGATIVE
Protein, ur: NEGATIVE mg/dL
pH: 5 (ref 5.0–8.0)

## 2011-01-22 LAB — URINE CULTURE
Colony Count: NO GROWTH
Culture: NO GROWTH

## 2011-01-22 LAB — MAGNESIUM: Magnesium: 1.8 mg/dL (ref 1.5–2.5)

## 2011-01-22 LAB — PREPARE FRESH FROZEN PLASMA

## 2011-01-22 LAB — BODY FLUID CULTURE: Culture: NO GROWTH

## 2011-01-22 LAB — PREPARE PLATELETS

## 2011-01-22 LAB — HEMOGLOBIN AND HEMATOCRIT, BLOOD: HCT: 25.5 % — ABNORMAL LOW (ref 39.0–52.0)

## 2011-01-22 LAB — DIFFERENTIAL
Basophils Absolute: 0 10*3/uL (ref 0.0–0.1)
Basophils Relative: 1 % (ref 0–1)
Eosinophils Relative: 3 % (ref 0–5)
Lymphocytes Relative: 17 % (ref 12–46)
Monocytes Absolute: 0.9 10*3/uL (ref 0.1–1.0)
Monocytes Relative: 12 % (ref 3–12)

## 2011-01-22 LAB — PHOSPHORUS
Phosphorus: 3.9 mg/dL (ref 2.3–4.6)
Phosphorus: 5.2 mg/dL — ABNORMAL HIGH (ref 2.3–4.6)

## 2011-01-22 LAB — PROTIME-INR
INR: 2.6 — ABNORMAL HIGH (ref 0.00–1.49)
INR: 3.1 — ABNORMAL HIGH (ref 0.00–1.49)

## 2011-01-22 LAB — TROPONIN I: Troponin I: <0.01 ng/mL (ref 0.00–0.06)

## 2011-01-22 LAB — POCT I-STAT 4, (NA,K, GLUC, HGB,HCT)
Glucose, Bld: 122 mg/dL — ABNORMAL HIGH (ref 70–99)
Hemoglobin: 8.2 g/dL — ABNORMAL LOW (ref 13.0–17.0)
Potassium: 4.3 mEq/L (ref 3.5–5.1)
Sodium: 139 mEq/L (ref 135–145)

## 2011-01-22 LAB — BRAIN NATRIURETIC PEPTIDE: Pro B Natriuretic peptide (BNP): 44 pg/mL (ref 0.0–100.0)

## 2011-01-22 LAB — CK TOTAL AND CKMB (NOT AT ARMC): Total CK: 110 U/L (ref 7–232)

## 2011-01-22 LAB — ANAEROBIC CULTURE

## 2011-01-22 LAB — APTT: aPTT: 39 seconds — ABNORMAL HIGH (ref 24–37)

## 2011-01-25 LAB — GLUCOSE, CAPILLARY
Glucose-Capillary: 102 mg/dL — ABNORMAL HIGH (ref 70–99)
Glucose-Capillary: 76 mg/dL (ref 70–99)
Glucose-Capillary: 81 mg/dL (ref 70–99)
Glucose-Capillary: 85 mg/dL (ref 70–99)
Glucose-Capillary: 95 mg/dL (ref 70–99)

## 2011-01-25 LAB — PROTIME-INR: Prothrombin Time: 30.5 seconds — ABNORMAL HIGH (ref 11.6–15.2)

## 2011-01-30 LAB — CBC
HCT: 28.8 % — ABNORMAL LOW (ref 39.0–52.0)
HCT: 30.9 % — ABNORMAL LOW (ref 39.0–52.0)
HCT: 24.9 % — ABNORMAL LOW (ref 39.0–52.0)
Hemoglobin: 10 g/dL — ABNORMAL LOW (ref 13.0–17.0)
Hemoglobin: 9.3 g/dL — ABNORMAL LOW (ref 13.0–17.0)
Hemoglobin: 8 g/dL — ABNORMAL LOW (ref 13.0–17.0)
Hemoglobin: 9.2 g/dL — ABNORMAL LOW (ref 13.0–17.0)
Hemoglobin: 9.7 g/dL — ABNORMAL LOW (ref 13.0–17.0)
MCHC: 32.8 g/dL (ref 30.0–36.0)
MCHC: 33.2 g/dL (ref 30.0–36.0)
MCHC: 33.3 g/dL (ref 30.0–36.0)
MCHC: 33.5 g/dL (ref 30.0–36.0)
MCHC: 34 g/dL (ref 30.0–36.0)
MCV: 89.5 fL (ref 78.0–100.0)
MCV: 89.7 fL (ref 78.0–100.0)
MCV: 89.9 fL (ref 78.0–100.0)
MCV: 90.4 fL (ref 78.0–100.0)
Platelets: 570 10*3/uL — ABNORMAL HIGH (ref 150–400)
Platelets: 720 10*3/uL — ABNORMAL HIGH (ref 150–400)
Platelets: 563 10*3/uL — ABNORMAL HIGH (ref 150–400)
RBC: 3.22 MIL/uL — ABNORMAL LOW (ref 4.22–5.81)
RBC: 3.39 MIL/uL — ABNORMAL LOW (ref 4.22–5.81)
RBC: 2.54 MIL/uL — ABNORMAL LOW (ref 4.22–5.81)
RBC: 2.77 MIL/uL — ABNORMAL LOW (ref 4.22–5.81)
RBC: 3.05 MIL/uL — ABNORMAL LOW (ref 4.22–5.81)
RBC: 3.2 MIL/uL — ABNORMAL LOW (ref 4.22–5.81)
RDW: 16.4 % — ABNORMAL HIGH (ref 11.5–15.5)
RDW: 16.7 % — ABNORMAL HIGH (ref 11.5–15.5)
RDW: 16.8 % — ABNORMAL HIGH (ref 11.5–15.5)
WBC: 10.7 10*3/uL — ABNORMAL HIGH (ref 4.0–10.5)
WBC: 6.5 10*3/uL (ref 4.0–10.5)
WBC: 6.8 10*3/uL (ref 4.0–10.5)
WBC: 7.9 10*3/uL (ref 4.0–10.5)
WBC: 8.9 10*3/uL (ref 4.0–10.5)
WBC: 9.5 10*3/uL (ref 4.0–10.5)

## 2011-01-30 LAB — BASIC METABOLIC PANEL
BUN: 18 mg/dL (ref 6–23)
BUN: 20 mg/dL (ref 6–23)
BUN: 5 mg/dL — ABNORMAL LOW (ref 6–23)
CO2: 20 mEq/L (ref 19–32)
CO2: 22 mEq/L (ref 19–32)
CO2: 24 mEq/L (ref 19–32)
CO2: 22 mEq/L (ref 19–32)
CO2: 23 mEq/L (ref 19–32)
CO2: 23 mEq/L (ref 19–32)
CO2: 25 mEq/L (ref 19–32)
Calcium: 7.4 mg/dL — ABNORMAL LOW (ref 8.4–10.5)
Calcium: 7.8 mg/dL — ABNORMAL LOW (ref 8.4–10.5)
Calcium: 7.9 mg/dL — ABNORMAL LOW (ref 8.4–10.5)
Calcium: 8.2 mg/dL — ABNORMAL LOW (ref 8.4–10.5)
Chloride: 101 mEq/L (ref 96–112)
Chloride: 111 mEq/L (ref 96–112)
Chloride: 113 mEq/L — ABNORMAL HIGH (ref 96–112)
Chloride: 113 mEq/L — ABNORMAL HIGH (ref 96–112)
Chloride: 106 mEq/L (ref 96–112)
Chloride: 108 mEq/L (ref 96–112)
Chloride: 112 mEq/L (ref 96–112)
Chloride: 113 mEq/L — ABNORMAL HIGH (ref 96–112)
Creatinine, Ser: 1.19 mg/dL (ref 0.4–1.5)
Creatinine, Ser: 1.4 mg/dL (ref 0.4–1.5)
Creatinine, Ser: 1.57 mg/dL — ABNORMAL HIGH (ref 0.4–1.5)
Creatinine, Ser: 1.6 mg/dL — ABNORMAL HIGH (ref 0.4–1.5)
Creatinine, Ser: 1.1 mg/dL (ref 0.4–1.5)
Creatinine, Ser: 1.15 mg/dL (ref 0.4–1.5)
Creatinine, Ser: 1.25 mg/dL (ref 0.4–1.5)
Creatinine, Ser: 1.31 mg/dL (ref 0.4–1.5)
GFR calc Af Amer: 54 mL/min — ABNORMAL LOW (ref >60)
GFR calc Af Amer: >60 mL/min (ref >60)
GFR calc Af Amer: >60 mL/min (ref >60)
GFR calc Af Amer: >60 mL/min (ref >60)
GFR calc Af Amer: >60 mL/min (ref >60)
GFR calc Af Amer: >60 mL/min (ref >60)
GFR calc Af Amer: >60 mL/min (ref >60)
GFR calc Af Amer: >60 mL/min (ref >60)
GFR calc non Af Amer: 45 mL/min — ABNORMAL LOW (ref >60)
GFR calc non Af Amer: 45 mL/min — ABNORMAL LOW (ref >60)
GFR calc non Af Amer: 45 mL/min — ABNORMAL LOW (ref >60)
GFR calc non Af Amer: 59 mL/min — ABNORMAL LOW (ref >60)
GFR calc non Af Amer: >60 mL/min (ref >60)
Glucose, Bld: 125 mg/dL — ABNORMAL HIGH (ref 70–99)
Glucose, Bld: 150 mg/dL — ABNORMAL HIGH (ref 70–99)
Glucose, Bld: 87 mg/dL (ref 70–99)
Potassium: 3.6 mEq/L (ref 3.5–5.1)
Potassium: 3.8 mEq/L (ref 3.5–5.1)
Potassium: 4.1 mEq/L (ref 3.5–5.1)
Potassium: 4.2 mEq/L (ref 3.5–5.1)
Potassium: 4.2 mEq/L (ref 3.5–5.1)
Sodium: 134 mEq/L — ABNORMAL LOW (ref 135–145)
Sodium: 137 mEq/L (ref 135–145)
Sodium: 138 mEq/L (ref 135–145)
Sodium: 139 mEq/L (ref 135–145)
Sodium: 139 mEq/L (ref 135–145)
Sodium: 136 mEq/L (ref 135–145)
Sodium: 136 mEq/L (ref 135–145)
Sodium: 141 mEq/L (ref 135–145)

## 2011-01-30 LAB — GLUCOSE, CAPILLARY
Glucose-Capillary: 102 mg/dL — ABNORMAL HIGH (ref 70–99)
Glucose-Capillary: 111 mg/dL — ABNORMAL HIGH (ref 70–99)
Glucose-Capillary: 115 mg/dL — ABNORMAL HIGH (ref 70–99)
Glucose-Capillary: 172 mg/dL — ABNORMAL HIGH (ref 70–99)
Glucose-Capillary: 72 mg/dL (ref 70–99)
Glucose-Capillary: 73 mg/dL (ref 70–99)
Glucose-Capillary: 75 mg/dL (ref 70–99)
Glucose-Capillary: 79 mg/dL (ref 70–99)
Glucose-Capillary: 80 mg/dL (ref 70–99)
Glucose-Capillary: 84 mg/dL (ref 70–99)
Glucose-Capillary: 87 mg/dL (ref 70–99)
Glucose-Capillary: 89 mg/dL (ref 70–99)
Glucose-Capillary: 89 mg/dL (ref 70–99)
Glucose-Capillary: 92 mg/dL (ref 70–99)
Glucose-Capillary: 94 mg/dL (ref 70–99)
Glucose-Capillary: 97 mg/dL (ref 70–99)
Glucose-Capillary: 97 mg/dL (ref 70–99)
Glucose-Capillary: 99 mg/dL (ref 70–99)
Glucose-Capillary: 102 mg/dL — ABNORMAL HIGH (ref 70–99)
Glucose-Capillary: 108 mg/dL — ABNORMAL HIGH (ref 70–99)
Glucose-Capillary: 109 mg/dL — ABNORMAL HIGH (ref 70–99)
Glucose-Capillary: 109 mg/dL — ABNORMAL HIGH (ref 70–99)
Glucose-Capillary: 116 mg/dL — ABNORMAL HIGH (ref 70–99)
Glucose-Capillary: 120 mg/dL — ABNORMAL HIGH (ref 70–99)
Glucose-Capillary: 84 mg/dL (ref 70–99)
Glucose-Capillary: 85 mg/dL (ref 70–99)
Glucose-Capillary: 86 mg/dL (ref 70–99)
Glucose-Capillary: 89 mg/dL (ref 70–99)
Glucose-Capillary: 92 mg/dL (ref 70–99)
Glucose-Capillary: 93 mg/dL (ref 70–99)
Glucose-Capillary: 94 mg/dL (ref 70–99)
Glucose-Capillary: 99 mg/dL (ref 70–99)

## 2011-01-30 LAB — DIFFERENTIAL
Basophils Absolute: 0 10*3/uL (ref 0.0–0.1)
Basophils Absolute: 0 10*3/uL (ref 0.0–0.1)
Basophils Absolute: 0 10*3/uL (ref 0.0–0.1)
Basophils Relative: 0 % (ref 0–1)
Basophils Relative: 0 % (ref 0–1)
Basophils Relative: 0 % (ref 0–1)
Eosinophils Absolute: 0 10*3/uL (ref 0.0–0.7)
Eosinophils Absolute: 0 10*3/uL (ref 0.0–0.7)
Eosinophils Absolute: 0.1 10*3/uL (ref 0.0–0.7)
Eosinophils Relative: 0 % (ref 0–5)
Eosinophils Relative: 0 % (ref 0–5)
Eosinophils Relative: 2 % (ref 0–5)
Lymphocytes Relative: 5 % — ABNORMAL LOW (ref 12–46)
Lymphocytes Relative: 15 % (ref 12–46)
Lymphs Abs: 0.5 10*3/uL — ABNORMAL LOW (ref 0.7–4.0)
Monocytes Absolute: 1.7 10*3/uL — ABNORMAL HIGH (ref 0.1–1.0)
Monocytes Absolute: 0.7 10*3/uL (ref 0.1–1.0)
Monocytes Absolute: 1.4 10*3/uL — ABNORMAL HIGH (ref 0.1–1.0)
Monocytes Relative: 8 % (ref 3–12)
Monocytes Relative: 15 % — ABNORMAL HIGH (ref 3–12)
Monocytes Relative: 9 % (ref 3–12)
Neutro Abs: 12.3 10*3/uL — ABNORMAL HIGH (ref 1.7–7.7)
Neutro Abs: 17.1 10*3/uL — ABNORMAL HIGH (ref 1.7–7.7)
Neutrophils Relative %: 87 % — ABNORMAL HIGH (ref 43–77)
Neutrophils Relative %: 90 % — ABNORMAL HIGH (ref 43–77)
Neutrophils Relative %: 77 % (ref 43–77)

## 2011-01-30 LAB — URINE MICROSCOPIC-ADD ON

## 2011-01-30 LAB — COMPREHENSIVE METABOLIC PANEL
AST: 12 U/L (ref 0–37)
Albumin: 2.2 g/dL — ABNORMAL LOW (ref 3.5–5.2)
Alkaline Phosphatase: 76 U/L (ref 39–117)
Chloride: 106 mEq/L (ref 96–112)
GFR calc Af Amer: >60 mL/min (ref >60)
Potassium: 2.7 mEq/L — CL (ref 3.5–5.1)
Total Bilirubin: 0.6 mg/dL (ref 0.3–1.2)
Total Protein: 6.3 g/dL (ref 6.0–8.3)

## 2011-01-30 LAB — URINALYSIS, ROUTINE W REFLEX MICROSCOPIC
Glucose, UA: NEGATIVE mg/dL
Glucose, UA: NEGATIVE mg/dL
Ketones, ur: NEGATIVE mg/dL
Leukocytes, UA: NEGATIVE
Nitrite: NEGATIVE
Protein, ur: 30 mg/dL — AB
Specific Gravity, Urine: 1.011 (ref 1.005–1.030)
pH: 5.5 (ref 5.0–8.0)

## 2011-01-30 LAB — URINE CULTURE: Colony Count: 25000

## 2011-01-30 LAB — VANCOMYCIN, TROUGH
Vancomycin Tr: 22.5 ug/mL — ABNORMAL HIGH (ref 10.0–20.0)
Vancomycin Tr: 38.6 ug/mL (ref 10.0–20.0)

## 2011-01-30 LAB — CLOSTRIDIUM DIFFICILE EIA

## 2011-01-30 LAB — IRON AND TIBC
Iron: <10 ug/dL — ABNORMAL LOW (ref 42–135)
UIBC: 77 ug/dL

## 2011-01-30 LAB — HEMOGLOBIN A1C
Hgb A1c MFr Bld: 5.1 % (ref 4.6–6.1)
Hgb A1c MFr Bld: 5.2 % (ref 4.6–6.1)
Mean Plasma Glucose: 100 mg/dL
Mean Plasma Glucose: 103 mg/dL

## 2011-01-30 LAB — ANAEROBIC CULTURE

## 2011-01-30 LAB — TISSUE CULTURE: Culture: NO GROWTH

## 2011-01-30 LAB — CROSSMATCH: Antibody Screen: NEGATIVE

## 2011-01-30 LAB — PROTIME-INR
INR: 2.3 — ABNORMAL HIGH (ref 0.00–1.49)
Prothrombin Time: 16.8 seconds — ABNORMAL HIGH (ref 11.6–15.2)

## 2011-01-30 LAB — FERRITIN: Ferritin: 905 ng/mL — ABNORMAL HIGH (ref 22–322)

## 2011-01-30 LAB — MAGNESIUM
Magnesium: 1.7 mg/dL (ref 1.5–2.5)
Magnesium: 1.7 mg/dL (ref 1.5–2.5)
Magnesium: 2 mg/dL (ref 1.5–2.5)

## 2011-01-30 LAB — RETICULOCYTES: Retic Ct Pct: 1.2 % (ref 0.4–3.1)

## 2011-01-30 LAB — FOLATE: Folate: 4.6 ng/mL

## 2011-01-30 LAB — PHOSPHORUS: Phosphorus: 3.4 mg/dL (ref 2.3–4.6)

## 2011-01-30 LAB — WOUND CULTURE

## 2011-02-27 NOTE — Consult Note (Signed)
Nicholas [name], TORRANCE NO.:  [id number]   MEDICAL RECORD NO.:  [id number]          PATIENT TYPE:  INP   LOCATION:  2107                         FACILITY:  [facility]   PHYSICIAN:  Fayrene Fearing L. Deterding, M.D.DATE OF BIRTH:  [birth year]   DATE OF CONSULTATION:  DATE OF DISCHARGE:                                 CONSULTATION   CONSULTING PHYSICIAN:  Dr. Sherrie Mustache, [facility] team.   REASON FOR CONSULT:  Acute kidney injury.   HISTORY OF PRESENT ILLNESS:  This 60 year old gentleman with a history  of orthopedic procedures that were fairly extensive in his right knee,  at least 5 over and the last being in 2007 with an artificial knee.  He  has had right shoulder surgery, we do not know the dates of that.  Apparently he fell about three weeks ago and has not moved very much  since.  He was seen by orthopedist and  evaluated.  He was given  meloxicam and hydrocodone.  He came to the emergency room 2 days ago  with weakness, shortness of breath, and found to have pneumonia.  No  right lower lobe.  His admission creatinine was 2.7, last one we have is  0.9 in 2007.  His creatinine has steadily  risen to 4.4.  He is not  oliguric.  He has MSSA in his urine.  Family notes there is no history  of chronic kidney disease, but that his significant problem.  His only  other  medications are the Mobic and Vicodin at home.  There may be some  other medicines that he takes for headaches.   PAST MEDICAL HISTORY:  Migraines.  Initially CKD is listed in his  admission history and physical, but the family is not able to find any  evidence of this, history of osteoarthritis.  He also had knee infection  at one time in the past.   ALLERGIES:  He has an allergy to [facility].  His allergy is listed on the  front of the chart to OXYCODONE and VANCOMYCIN.   SOCIAL HISTORY:  He lives with his former son-in-law.  He is nonsmoker  and nondrinker.   FAMILY HISTORY:  Mother is alive in her 45s.   Father died of toxicity in  9 and heart disease.  He has 1 sister with breast cancer and another  one with diabetes and his sibling is healthy.  He has 2 children who are  healthy.   OBJECTIVE:  VITAL SIGNS:  On physical exam, blood pressure 102/56,  temperature 101.4.  GENERAL:  He is oriented x1 and is very confused.  HEENT:  Not cooperative to exam.  NECK:  Without masses or thyromegaly.  LUNGS:  Crackles in both bases right greater than left.  CARDIOVASCULAR:  Regular rate and rhythm grade 1-2/6 systolic ejection  murmur best heard in the upper sternal border.  PMI is 10 cm lateral to  the midsternal line, fifth intercostal space.  No significant edema.  Pulses are 2+ overall.  BREAST:  Abdomen active bowel sounds.  Soft and nontender.  It was down  3-4  cm. Abdomen is mildly distended.  SKIN:  Rash on the lower extremities with furuncles on the legs and  erosions on the right leg with evidence of some eczematoid changes.  The  right knee is severely deformed.  It has a large effusion medially.  It  is very hot, and it goes down the leg.  He screams with movement of his  toes on that side.  He is severely tender on that area.  Left has some  tenderness, but not like that.   LABORATORY DATA:  His urine output now is 1116.  His output was 2260 and  2715.  Ultrasound shows his right kidney at 12.9 cm and left kidney  being 13.7 cm.  A laboratory hepatitis B surface antigen negative.  Hepatitis C antibody negative.  Ammonia is normal at 21.  Urine sodium  24, urine creatinine is 79, urine positive for MSSA, creatinine has gone  from 2.7 to 3.2 to 3.8 to 4.4 today.  Sodium 142, potassium 4.2,  chloride 118, bicarbonate 21, glucose 134, hemoglobin 11.6, white count  of 16.1, and platelets 333,000.  Alk phos elevated at 121, SGOT elevated  at 135, bilirubin 2.8, albumin 1.5, calcium is 7.4, and CK 1530.   ASSESSMENT:  1. Acute kidney injury.  Question of chronic kidney disease.  I do  not      see that we have got evidence for that.  He has had hypotension,      sepsis, and evidence of a nonsteroidal consistent __________ which      inhibits the kidneys because of his active sepsis.  He is      nonoliguric and volume is okay.  He is mildly acidemic secondary to      his low GFR.  He has had rapid revaluation of the acute kidney      injury.  Acute interstitial disease, acute tubular necrosis, rule      out metabolic disease or hypertension as a cause.  Hopefully,  will      turn this around but  he may need dialysis very soon.  Need to      control his infection to take care of his problems.  2. Septic syndrome.  We have questioned __________ a Staph infection,      I am not sure the urine is a primary source or whether the knee      could be the primary source.  He is on vancomycin.  He may need      drainage of the knee.  3. Confusion.  Needs imaging of his brain at this time.  Apparently      this is new and we will have an extensive discussion with family.  4. Increase LFTs.  5. Nutrition.  We will need tube feeds.  6. Anemia secondary to his acute illness.  7. Pneumonia.   PLAN:  As above.           ______________________________  Llana Aliment. Deterding, M.D.     JLD/MEDQ  D:  08/31/2008  T:  09/01/2008  Job:  [id number]

## 2011-02-27 NOTE — Op Note (Signed)
NAMEMarland Kitchen  BILLAL, [name], M.D.    DATE OF BIRTH:  Apr 29, 1951   DATE OF PROCEDURE:  03/29/2009  DATE OF DISCHARGE:                               OPERATIVE REPORT   PREOPERATIVE DIAGNOSES:  Right knee infection, [name]; [name], [name]; [name], antibiotic spacers, [name], [name], [name], [name], [name], [name], [name], the cement  was difficult to extract because of the bony defect, [name], the following cement removal and extraction, [name], were  controlled using electrocautery, [name], [name], [name], [name], [name], [name], [name], this was filled with patella, [name], the incision was  irrigated around the knee.  The proximal and distal incisions were  irrigated and closed using interrupted and inverted 0 Vicryl suture, 2-0  Vicryl suture, and skin staples.  The incision was closed using #1  Vicryl suture, 0 Vicryl suture, 2-0 Vicryl suture, [name], M.D.  Electronically Signed     GSD/MEDQ  D:  03/29/2009  T:  03/30/2009  Job:  [id number]

## 2011-02-27 NOTE — Group Therapy Note (Signed)
Nicholas [name], Nicholas [name] NO.:  [id number]   MEDICAL RECORD NO.:  [id number]          PATIENT TYPE:  INP   LOCATION:  3314                         FACILITY:  [facility]   PHYSICIAN:  Elliot Cousin, M.D.    DATE OF BIRTH:  [birth year]                                 PROGRESS NOTE   PROGRESS NOTE/INTERIM DISCHARGE SUMMARY:   INTERIM DISCHARGE DIAGNOSES:  1. Systemic inflammatory response syndrome versus sepsis.  2. Methicillin-sensitive Staphylococcus aureus pyelonephritis versus      seeding from right knee abscess.  3. Methicillin-sensitive Staphylococcus aureus knee abscess, status      post right knee arthrotomy with incision and drainage and      synovectomy by Dr. August Saucer on August 31, 2008.  4. Left great toe gangrene.  5. Suspected pneumonia at the time of the initial assessment.  The      patient continues to have tachypnea.  However, his oxygen      saturations are relatively within normal limits.  6. Acute renal failure, nonoliguric.  7  Hypernatremia.  1. Hepatic transaminitis with a history of chronically elevated liver      function tests and a remote history of alcohol abuse.  Viral      hepatitis panel negative.  2. Toxic metabolic encephalopathy with evidence of a small non-      hemorrhagic infarct of the anterior right periopercular region.  3. Dysphagia  thought to be secondary to encephalopathy.  4. Hyperglycemia secondary to dextrose in the IV fluids and tube      feedings  5. Anemia, probably multifactorial.   CONSULTATIONS:  1. Burnard Bunting, M.D.  2. Fayrene Fearing L. Deterding, M.D.  3. Marca Ancona, MD.   PROCEDURE PERFORMED:  1. MRI of the brain on September 05, 2008.  The results revealed a      limited motion degraded exam.  Small non-hemorrhagic infarct in the      anterior right  periopercular region.  2. Acute abdominal series on September 04, 2008.  The results revealed      moderately limited evaluation of the abdomen without evidence of    obstruction or free intraperitoneal air.  Feeding tube at the      gastric body.  Low lung volumes with bibasilar atelectasis.  3. TEE (transesophageal echocardiogram).  Performed on September 02, 2008 by Dr. Shirlee Latch.  In essence, the results revealed an ejection      fraction of 60%, no regional wall motion abnormalities, small      patent foramen ovale, no evidence of vegetations.  Aortic root      dilated to 4.3 cm.  4. X-ray of the left great toe on September 01, 2008.  The results      revealed osteoarthritis of the left first MTP joint.  Diffuse soft      tissue swelling.  5. Status post right knee arthrotomy with incision and drainage and      synovectomy by Dr. August Saucer on August 31, 2008.  6. CT scan of the left lower extremity on August 31, 2008.  The      results revealed a large knee effusion likely with synovitis.      Large collection of fluid superficial to the medial patellar      retinaculum.  No fracture identified.  The polymer components of      the implant appear well positioned and normal in thickness.  Mild      lucency along the proximal femoral portion of the implant raising      possibility of early loosening or infection.  7. CT scan of the head on August 31, 2008.  The results revealed a      negative head CT.  8. VQ scan on August 30, 2008.  The results revealed essentially      normal perfusion lung scan.  No evidence for pulmonary emboli.  9. Bilateral lower extremity venous Doppler study on August 30, 2008.  The results revealed no evidence of DVT.  10.Renal ultrasound on August 29, 2008.  The results revealed an      unremarkable renal ultrasound.  Right kidney measures 12.9 cm and      the left kidney measures 13.7 cm.  No evidence of hydronephrosis.   HISTORY OF PRESENT ILLNESS:  The patient is a 60 year old man with a past medical history significant  for osteoarthritis and status post total right knee arthroplasty in  early 2000  with revision, who presented to the emergency department on  August 28, 2008 with a chief complaint of generalized weakness,  shortness of breath, poor appetite and right knee swelling.  Also the  patient had fallen at home.  When he was evaluated in the emergency  department, he was noted to be febrile with a temperature of 101.5.  His  blood pressure was within normal limits.  However, he was tachycardiac  with a heart rate of 127 beats per minute and mildly tachypneic with  respiratory rate of 22.[name]  His chest x-ray in the emergency department  revealed a right perihilar and lower lobe opacity concerning for  pneumonia.  The x-ray of his right knee revealed soft tissue swelling  but no acute bony abnormality.  His lab data were significant for a  urinalysis that revealed 11-20 WBCs and 7-10 RBCs, a D-dimer that was  elevated at 7.01, a WBC of 22.1, a BNP of 340, and a creatinine that was  elevated at 2.7.  The patient was admitted for further evaluation and  management.   For additional details please see the dictated history and physical.   HOSPITAL COURSE.:  1. METHICILLIN-SENSITIVE STAPH AUREUS, PYELONEPHRITIS, METHICILLIN-      SENSITIVE STAPH AREUS RIGHT KNEE ABSCESS AND SUSPECTED PNEUMONIA.      Blood cultures were ordered at the time of the initial assessment.      A urine culture was ordered as well.  The patient was started on      empiric antibiotic treatment with Rocephin and azithromycin.      Shortly after hospital admission, the antibiotic therapy was      broadened to Zosyn and vancomycin;  Rocephin was discontinued.      There was a suspicion for aspiration pneumonia.  The speech      therapist evaluated the patient and recommended that the patient be      kept n.p.o. until he was more alert.  The patient's EKG revealed      sinus tachycardia with a heart rate of 102 beats per minute and  nonspecific ST abnormalities.  Additional lab studies revealed an       ammonia level of 45 only marginally elevated, a lipase of 56, CK of      725, a troponin-I of 1.01 and a TSH of 2.204.  His troponin-I      decreased dramatically the following day to 0.14.  Because of his      respiratory symptoms, an ABG was ordered on 3 liters of nasal      cannula oxygen and it revealed a pH of 7.45, pCO2 of 29 and a pO2      of 69.  Obviously the patient was hyperventilating.  A VQ scan was      ordered to rule out PE as the patient's D-dimer was significantly      elevated.  The VQ scan was essentially normal revealing no evidence      of PE.  Because of the significantly swollen right knee, a CT scan      was ordered for further evaluation.  It revealed a very large knee      effusion likely secondary to synovitis.  Orthopedic surgeon, Dr.      August Saucer was consulted.  He evaluated the patient and performed an      incision and drainage shortly thereafter.  Over the course of the      hospitalization, the patient's blood cultures remained negative.      However, his urine culture grew out greater than 100,000 colonies      of staph aureus.  Specimens from the right knee also grew out staph      aureus.  The Staphylococcus is essentially methicillin-sensitive.      It is unclear whether or not the patient had a true staph aureus      pyelonephritis or if there was seeding from the right knee.  As of      today, the patient has completed 8 days of antibiotic therapy with      vancomycin, Zosyn, and azithromycin.  He is currently afebrile.      However, his white count is still mildly to moderately elevated at      16,000.  At times he becomes tachypneic although it does not appear      that he is in any respiratory distress.  Multiple chest x-rays have      been ordered and in essence they reveal low lung volumes with      bibasilar atelectasis.  Dr. August Saucer evaluated the patient again last      night and recommended that the implant in his right knee be      removed.   However, he wanted to remove the implant after the      patient's kidney function improves and following adequate treatment      with antibiotics.  2. LEFT GREAT TOE GANGRENE.  The patient's left great toe shows      evidence of dry gangrene.  An x-ray was ordered and it revealed      osteoarthritis of the left first MTP joint along with diffuse soft      tissue swelling.  Pedal pulses are palpable.  It is unclear why the      patient developed gangrene of the left great toe although there was      a suspicion of some type of embolic event.  ABIs for evaluation      have been ordered.  Dr. August Saucer evaluated the toe.  An amputation  may      be needed.  3. RULE OUT ENDOCARDITIS.  Nephrologist Dr. Darrick Penna was consulted      for evaluation of the patient's acute renal failure.  Because of      the staph aureus present in the urine and in the right knee, he was      concerned about endocarditis.  Therefore cardiologist, Dr. Shirlee Latch      was consulted to perform a TEE.  Prior to the TEE, a 2-D      echocardiogram was ordered and it revealed an ejection fraction of      55% and no obvious evidence of vegetations.  Dr. Shirlee Latch performed      the TEE on September 02, 2008 and in essence, it did not reveal any      obvious vegetations.  4. ACUTE RENAL FAILURE AND HYPERNATREMIA.  As indicated above, the      patient's initial creatinine was 2.7 and his BUN was 58 on      admission.  He was started on volume repletion with normal saline.      A renal ultrasound was ordered and the results were unremarkable.      His urine output has been adequate during the hospitalization.  For      further evaluation and management, nephrologist, Dr. Darrick Penna was      consulted.  He made changes in the IV fluids.  He ordered a number      of laboratory tests.  The results were essentially unremarkable      with the complement 3 being within normal limits at 145 and the      compliment 4 being within normal limits at  28.  Over the course of      the hospitalization, the patient's creatinine reached a high of      4.11.  During this time, his serum sodium increased to a high of      159.  The IV fluids were changed to D5W.  Over the past 48 hours,      his serum sodium has improved to 151.  His BUN is still mildly to      moderately elevated at 58 and his creatinine has improved only      marginally to 3.11.  His renal function will continue to be      monitored.  5. HEPATIC TRANSAMINITIS.  The patient's hepatic transaminases were      moderately elevated initially.  At time of the initial hospital      assessment, his total bilirubin was 3.6, direct bilirubin 2.2, SGOT      219, and SGPT was 66.  The family was questioned about alcohol use      and apparently the patient did have a remote history of alcohol      abuse several years ago but lately, he had not been drinking very      much alcohol at all.  His ammonia level was assessed and was only      marginally elevated at 45.  An acute viral hepatitis panel was      ordered and it was essentially unremarkable.  The renal ultrasound      did reveal that the patient had evidence of a fatty liver.  I      discussed the patient's history with his son who stated that      historically both he and his father (he patient) had a history of  elevated liver transaminases.  As of today, his total bilirubin has      been stable at 3.1.  However, his SGOT has improved to 72 and his      SGPT is currently within normal limits at 31.  6. TOXIC METABOLIC ENCEPHALOPATHY AND RECENT EVIDENCE OF A SMALL NON-      HEMORRHAGIC INFARCT IN THE RIGHT PERIOPERCULAR REGION.  For the      majority of the hospitalization, the patient was somewhat lethargic      and confused.  According to his family, his mentation during the      majority of the hospitalization was very different from his      baseline.  A CT scan of the head was ordered and it revealed no      acute  findings.  It was felt that the patient's confusion was      simply the consequence of significant infections along with      metabolic derangements including hypernatremia and acute renal      insufficiency.  Today however, an MRI of the brain was ordered and      it revealed a small non hemorrhagic infarct on the right.  It is      unclear whether or not thisl finding is contributing to the      patient's mental status changes.  I believe this is less likely.      As his infections have been treated and as his serum sodium has      improved, so has his mental status.  Over the past 24 hours, he has      become much more alert and recognizes his family.  We will continue      to monitor his mental status throughout the hospitalization.  An      aspirin at 81 mg daily has been added.  7. DYSPHAGIA.  As indicated above, the speech therapist evaluated the      patient at the time that he was very lethargic.  For nutrition, a      Panda tube was inserted and he was started on tube feedings.      Because of his improved alertness, the speech therapist was asked      to come back by to reevaluate the patient's swallowing.  8. NORMOCYTIC ANEMIA.  At the time of the initial hospital assessment,      the patient's hemoglobin was 14.1.  With IV fluid volume repletion      and with multiple venipunctures, his hemoglobin has fallen to 8.7.      Iron studies have been ordered and revealed a total ferritin of      634, vitamin B12 of 957, total iron of 12, and TIBC of 156.  More      than likely the etiology of the patient's anemia is multifactorial,      including multiple venipunctures, hemodilution with IV fluids,      acute renal failure and cannot rule out mild stress associated GI      bleeding.  9. HYPERGLYCEMIA.  After D5W and tube feedings were started, the      patient's capillary blood glucose increased.  He was therefore      started on sliding scale NovoLog along with Lantus insulin.   With      the addition of NovoLog and Lantus, the patient's capillary blood      glucose has been reasonably controlled.   DISCHARGE DISPOSITION:  The  patient is not quite ready for hospital discharge yet.  His mental  status is improving.  His renal function is stabilizing without any  significant improvement over the past 48 hours.  His hemoglobin has fallen to 8.7 and this will be watched as the patient may require packed  red blood cell transfusions.  The speech therapist reevaluation is  pending to assess ongoing dysphasia.  Aspirin therapy has been added for  the newly diagnosed right brain infarct.  Another urinalysis and culture  have been ordered.  ABIs have been ordered to assess for peripheral  vascular disease.  The tentative plan is for Dr. August Saucer to remove the  right knee implant when the patient's status has stabilized.      Elliot Cousin, M.D.     DF/MEDQ  D:  09/05/2008  T:  09/05/2008  Job:  [id number]   cc:   Fayrene Fearing L. Deterding, M.D.  Fax: [phone]   G. Dorene Grebe, M.D.  Fax: [phone]   Dineen Kid. Reche Dixon, M.D.  Fax: [phone]   Marca Ancona, MD  [address]  [place] [address]
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
## 2011-02-27 NOTE — Discharge Summary (Signed)
NAMESOU, NOHR NO.:  [id number]   MEDICAL RECORD NO.:  [id number]          PATIENT TYPE:  INP   LOCATION:  5021                         FACILITY:  [facility]   PHYSICIAN:  Burnard Bunting, M.D.    DATE OF BIRTH:  [birth year]   DATE OF ADMISSION:  12/07/2008  DATE OF DISCHARGE:  12/14/2008                               DISCHARGE SUMMARY   DISCHARGE DIAGNOSIS:  Right knee infection.   SECONDARY DIAGNOSES:  1. Clostridium difficile colitis.  2. History of Staphylococcus sepsis.  3. History of pyelonephritis.  4. History of chronic renal insufficiency.   OPERATIONS/PROCEDURES:  1. Right knee arthroplasty, antibiotic spacer exchange, December 10, 2008.  2. Flexible sigmoidoscopy performed December 09, 2008 which was      negative for pseudomembranous colitis.   HOSPITAL COURSE:  Jathan Balling is a 60 year old patient who has infected  right TKA.  He was actually admitted back in November with staph sepsis.  He had component removal and antibiotic spacer placed at that time.  He  completed a 6-week course of IV vancomycin.  During that the course of  therapy he developed C. diff colitis and is in the middle of a treatment  course for that including vancomycin and Flagyl.  He presents now for  antibiotic spacer exchange.  On the day of his proposed surgery he was  noted to be hypokalemic.  He was subsequently admitted to the hospital.  A nephro consultation was obtained and electrolyte balance was achieved.   HOSPITAL COURSE:  The patient was admitted to the orthopedic service,  December 07, 2008.  The patient had a potassium of 2.7 that was  corrected with supplementation.  Medical management was also obtained.  The patient underwent multiple C. diff stools samples which were  negative.  Flexible sigmoidoscopy on December 09, 2008, also showed no  pseudomembranes.  He was subsequently taken to surgery after  stabilization of his metabolic parameters and  underwent antibiotic liner  exchange.  Cultures from that time were negative.  The patient tolerated  the procedure well without immediate complication.  He was started on  Coumadin for DVT prophylaxis.  GI recommended a 2-week course of oral  vancomycin  following conclusion of his hospital stay.  The patient did  receive 1 unit of packed red blood cell transfusion for hemoglobin of 8  postop.  His Coumadin was therapeutic at the time of discharge of 2.4.  Incision was also intact at that time.  He was mobilized with physical  therapy, weightbearing as tolerated only in the knee immobilizer.  He is  going to be discharged back to the nursing home in good condition.  He  will be weightbearing as tolerated with the knee immobilizer on the  right-hand side.  He will continue a 2-week course of oral vancomycin to  treat any potential refractory C. diff colitis.  Sutures will be removed  in 2 weeks and we will plan for further definitive surgery in 6 weeks  time with antibiotic spacer removal.  A nutrition consult was also  obtained in order to maximize his nutritional balance and the importance  of this was also discussed with the patient.  The patient is discharged  in good condition.   DISCHARGE MEDICATIONS:  Include:  1. __________ one p.o. b.i.d.  2. Celexa 10 mg p.o. daily.  3. Protonix 40 mg p.o. q.24h.  4. Coumadin 5 mg p.o. daily until INR of 2-2.5.  5. Lantus insulin 5 units subcutaneous nightly.  6. Florastor 250 mg p.o. b.i.d.  7. Vancomycin 250 mg p.o. b.i.d. times 1 week then 125 mg p.o. b.i.d.      for 1 week then discontinue oral vancomycin.  8. Ensure 1 can p.o. t.i.d.  9. Extra-strength Vicodin one p.o. q.3-4h. p.r.n. pain.  10.Restoril 50 mg p.o. nightly p.r.n..   The patient will follow up me in 2 weeks for suture removal.      Burnard Bunting, M.D.  Electronically Signed     GSD/MEDQ  D:  12/14/2008  T:  12/14/2008  Job:  [id number]

## 2011-02-27 NOTE — Consult Note (Signed)
NAMEMarland Kitchen  RAJESH, WYSS NO.:  [id number]   MEDICAL RECORD NO.:  [id number]          PATIENT TYPE:  INP   LOCATION:  5021                         FACILITY:  [facility]   PHYSICIAN:  Graylin Shiver, M.D.   DATE OF BIRTH:  12/02/1950   DATE OF CONSULTATION:  12/08/2008  DATE OF DISCHARGE:                                 CONSULTATION   We were asked to see Mr. Doerner today in consultation for C. difficile  colitis by Dr. Brien Few of [facility].   HISTORY OF PRESENT ILLNESS:  This patient was admitted for an antibiotic  spacer change in his right knee.  However, it was noted that he was  hypokalemic and he was found to be continuing to have diarrhea.  He  initially developed C. diff in December 2009 and was again positive in  November 25, 2007.  He was started on Flagyl p.o. and vancomycin on  November 23, 2008, and is about to finish his 2-week course.  The patient  reports that his stools are greatly improved from 2 weeks ago when they  were continuous.  Now he is only having about 6 stools a day.  He denies  any vomiting.  Does admit to tender abdomen with some cramping.  Mostly  his biggest complaint is that his bottom is very irritated.   The patient reports colonoscopy about 6 years ago with [facility].  He  reports that was positive for polyps and ulcers and that he is due for  another one.  I will try to get an operative report on this colonoscopy.  PCP is Baltazar Najjar at [facility].   PAST MEDICAL HISTORY:  Significant for right total knee arthroplasty in  2002 secondary to osteoarthritis since then she is having a series of  infections including history of septic arthritis in his right knee from  which he develops MSSA and eventually SIRS with shock liver and acute  renal failure in December 2009.  He also had pyelonephritis and gangrene  in his great left toe.  He had septic embolization in his left thumb and  hand.  He has had chronic renal insufficiency and  GERD.   CURRENT MEDICATIONS:  Lantus, Protonix, Florastor, magnesium oxide,  Trinsicon, vancomycin, Flagyl, Celexa, and cholestyramine.   ALLERGIES:  He has an allergy to PERCOCET.   REVIEW OF SYSTEMS:  Per HPI.   SOCIAL HISTORY:  He lives at [facility].  Denies tobacco and  alcohol use.   FAMILY HISTORY:  Noncontributory.   PHYSICAL EXAMINATION:  GENERAL:  He is alert and oriented, in no  apparent distress.  VITAL SIGNS:  Temperature 97.9, pulse 88, respirations 16, blood  pressure is 100/66.  HEART:  Has a regular rate and rhythm.  LUNGS:  Clear to auscultation anteriorly.  ABDOMEN:  Soft, nondistended, diffusely tender particularly in the lower  quadrants.   LABORATORY DATA:  A white count 7.9, hemoglobin 9.8, platelets 463,000,  hematocrit 28.6.  PT is 17.1, INR is 1.3.  BMET is significant for  potassium of 2.7.   RADIOLOGICAL EXAM:  CT  of abdomen and pelvis done on December 07, 2008,  which shows pancolitis.   ASSESSMENT:  Dr. Herbert Moors has seen and examined the patient,  collected a history and reviewed his chart.  His impression is as  follows.  The patient likely has chronic Clostridium difficile colitis.  He is slowly improving.  He is also hypokalemic and has multiple other  medical problems surrounding his orthopedic issues.   PLAN:  Agree with medications that he has been on as an outpatient  including oral vancomycin, oral Flagyl, Questran, and a probiotic.  We  will proceed with a flexible sigmoidoscopy on December 09, 2008, to  evaluate his pseudomembranes to see if his C. diff is clearing up or if  it indeed needs a long-term treatment with vancomycin.      Stephani Police, PA    ______________________________  Graylin Shiver, M.D.    MLY/MEDQ  D:  12/08/2008  T:  12/09/2008  Job:  [id number]   cc:   Graylin Shiver, M.D.  Graylin Shiver. August Saucer, MD

## 2011-02-27 NOTE — Discharge Summary (Signed)
[name], [name] NO.:  [id number]   MEDICAL RECORD NO.:  [id number]          PATIENT TYPE:  INP   LOCATION:  1513                         FACILITY:  [facility]   PHYSICIAN:  Charlestine Massed, MDDATE OF BIRTH:  11-05-1950   DATE OF ADMISSION:  11/22/2008  DATE OF DISCHARGE:                               DISCHARGE SUMMARY   PRIMARY CARE PHYSICIAN:  Dr. Baltazar Najjar at [facility]   REASON FOR ADMISSION:  Recurrent nausea, vomiting and diarrhea.   [name] is a 60 year old male who came from [facility]  nursing home facility secondary to worsening nausea, vomiting and  diarrhea.  He had crampy abdominal bowel pain for one week.  He was seen  in the emergency department 1-day before and was diagnosed with C. diff  colitis and was started on Flagyl treatment in the nursing home.  There,  he was having recurrent nausea, vomiting and diarrhea and was  dehydrated, and so sent to the hospital for admission.  He did not have  any hematemesis, hematochezia, melena or any fevers or chills.   The patient was admitted and started on IV fluids, put on n.p.o. because  of recurrent nausea and vomiting.  Nausea and vomiting controlled with  medications.  He was started on Flagyl and vancomycin p.o.  The patient  improved gradually, though the patient initially had diffuse abdominal  tenderness due to colitis, which improved gradually with medications.  A  CT scan done on November 21, 2008, before which was showing evidence of  diffuse colonic inflammatory process, so continued antibiotics.   The patient's clinical condition improved.  Pain decreased considerably,  so the patient was continued on the medications, started on Florastor  and cholestyramine.   The patient has improved, now the diarrheal episodes are close to 1-2  per day and there is no nausea or vomiting now.  The pain has  considerably come down, so the patient is discharged back to the  nursing  home.   DISCHARGE DIAGNOSES:  1. Clostridium difficile colitis.  2. Prior history of Methicillin sensitive Staphylococcus aureus      pyelonephritis and prior history of septic arthritis, currently      stable.  3. Decreased mobility.  4. Prior history of renal insufficiency, currently stable.  Currently,      the acute renal insufficiency has stabilized.   DISCHARGE MEDICATIONS:  1. Flagyl 500 mg p.o. t.i.d. is day #6, to be continued for another 10      days.  2. Vancomycin 250 mg p.o. four times a day q.6 h., to be continued for      another 10 days.  3. Florastor 250 mg p.o. t.i.d.  4. Cholestyramine 4 grams p.o. four times daily.  5. Protonix 40 mg p.o. daily.  6. Magnesium oxide 400 mg p.o. b.i.d.  7. Potassium chloride 10 mEq p.o. daily.   ALLERGIES:  The patient has been previously labeled as allergic to  oxycodone and vancomycin due to nausea and vomiting intolerance, but  currently the patient is on p.o. vancomycin and he was not  experiencing  any nausea, vomiting or any rash, so the fact of allergy to vancomycin  needs to be removed.   FOLLOWUP:  The MD at the nursing home is to follow up the patient for  further hydration, as well as antibiotic continuation.   DISCHARGE INSTRUCTIONS:  Contact precautions to be followed in the  nursing home until patient's stool becomes C diff negative.  After a  full course of antibiotic if the diarrhea has subsided, then stools can  be checked after 1 week and it is negative, then contact precautions can  be discontinued.      Charlestine Massed, MD  Electronically Signed     UT/MEDQ  D:  11/29/2008  T:  11/29/2008  Job:  [id number]   cc:   Maxwell Caul, M.D.

## 2011-02-27 NOTE — H&P (Signed)
NAMEFREDERIC, TONES NO.:  [id number]   MEDICAL RECORD NO.:  [id number]          PATIENT TYPE:  INP   LOCATION:  2107                         FACILITY:  [facility]   PHYSICIAN:  Della Goo, M.D. DATE OF BIRTH:  27-Nov-1950   DATE OF ADMISSION:  08/29/2008  DATE OF DISCHARGE:                              HISTORY & PHYSICAL   PRIMARY CARE PHYSICIAN:  Dr. Reche Dixon of [facility]   CHIEF COMPLAINT:  Weakness, short of breath.   HISTORY OF PRESENT ILLNESS:  This is a 60 year old male who presents to  the emergency department with complaints of worsening weakness and  shortness of breath along with decreased appetite over the past 3 weeks.  The patient reports falling 3 weeks ago and being unable to move.  He  reports injuring his right knee which previously had a right total knee  replacement surgery and has been bed-bound since.  The patient reports  having cough but denies having any cough production.  He denies having  any fevers, chills.  The patient was evaluated in the emergency  department and a chest x-ray was performed which did reveal a right  perihilar and left lower lobe opacity consistent with pneumonia.  Also,  a small left-sided effusion and cardiomegaly were seen on the x-ray.  The patient was found to have multiple abnormalities and was referred  for admission.   PAST MEDICAL HISTORY:  1. History of chronic renal insufficiency.  2. History of osteoarthritis.   SURGICAL HISTORY:  Positive for total knee replacement, followed by a  right total knee revision, June 25, 2008 secondary to infection.   MEDICATIONS:  1. Vicodin.  2. Mobic.   ALLERGIES:  Percocet.   SOCIAL HISTORY:  The patient reports being a nonsmoker, nondrinker.   FAMILY HISTORY:  Noncontributory.   REVIEW OF SYSTEMS:  Pertinents are mentioned above.   PHYSICAL EXAMINATION FINDINGS:  GENERAL:  This is a 60 year old well-  nourished, well-developed male who tachypneic and  in acute distress.  VITAL SIGNS:  Temperature 101.5, blood pressure 116/70, heart rate 127,  respirations 22 and O2 saturations 93-94%.  HEENT:  Normocephalic, atraumatic.  Pupils equally round reactive to  light.  Extraocular movements are intact.  There is no scleral icterus.  Oropharynx is clear.  NECK:  Supple, full range of motion.  No thyromegaly, adenopathy,  jugular venous distention.  CARDIOVASCULAR:  Tachycardiac rate and rhythm.  No murmurs, gallops or  rubs.  LUNGS:  Decreased breath sounds bilaterally.  Positive rhonchi  diffusely.  No wheezes.  ABDOMEN:  Positive bowel sounds, soft, nontender, nondistended.  EXTREMITIES:  The right lower extremity has a midline surgical scar  across the patellar consistent with the total knee replacement surgery.  There is edema of the patellar area and tenderness along the lateral  aspects of the patella.  The patient has limited range of motion in the  right knee joint.  NEUROLOGIC:  Examination the patient has generalized weakness but there  are no focal deficits.   LABORATORY STUDIES:  Chemistry with a sodium of 136, potassium 3.9,  chloride 107, bicarb  21, BUN 58, creatinine 2.7 and glucose 163, albumin  2.0, AST 219, ALT 66, alkaline phosphatase 210, lipase 56, ammonia 45.  Urinalysis positive nitrites, small leukocyte esterase.  Chest x-ray  findings mentioned above.  The patient had x-rays of the right knee  which were negative for fracture.   ASSESSMENT:  A 60 year old male being admitted with:  1. Pneumonia.  2. Respiratory distress.  3. Urinary tract infection.  4. Sepsis.  5. Hypoalbuminemia.  6. Acute renal failure with chronic renal insufficiency.  7. Elevated liver function test.   PLAN:  The patient will be admitted to a step-down ICU area and has been  placed on IV antibiotic therapy of Rocephin and azithromycin for now.  Supplemental oxygen has been ordered and the patient will be placed on  nebulizer  treatments.  A D-dimer has been ordered along with cardiac  enzymes and an ABG.  A beta natriuretic peptide has also been ordered.  The patient's medications will be further verified and DVT and GI  prophylaxis will be ordered as well.  Pending results of his studies and  his clinical course further workup and treatments will be rendered.      Della Goo, M.D.  Electronically Signed     HJ/MEDQ  D:  08/30/2008  T:  08/30/2008  Job:  [id number]

## 2011-02-27 NOTE — Discharge Summary (Signed)
NAMEHEROLD, SALGUERO                ACCOUNT NO.:  [id number]   MEDICAL RECORD NO.:  [id number]          PATIENT TYPE:  INP   LOCATION:  3314                         FACILITY:  [facility]   PHYSICIAN:  Herbie Saxon, MDDATE OF BIRTH:  Aug 25, 1951   DATE OF ADMISSION:  08/28/2008  DATE OF DISCHARGE:                               DISCHARGE SUMMARY   ADDENDUM   This is an interim discharge summary detailing the events between  September 15, 2008, to September 20, 2008.  Please refer to the previous  discharge summary dictated by Dr. Sharon Seller on September 13, 2008.   DISCHARGE DIAGNOSES:  Remains the same, with addition of hypernatremia  and hypokalemia.   The patient did have workup for possible pulmonary embolism, but this  was negative.  The V/Q scan was done, performed on August 19, 2008,  showing low probability for PE and only some complaint of obstructive  pulmonary disease, same.  Repeat chest x-ray on August 19, 2008, showed  elevated right hemidiaphragm and bibasilar atelectasis.  His modified  barium swallow showed a history of severe dysphagia and for now speech  therapy is recommended, nil per oral and NG-tube feeding, which he is  tolerating.  Abdominal x-ray on September 16, 2008, showed successful  feeding placement using fluoroscopy.  However, his azotemia continues to  be on a gentle rise.  His diarrhea has been resolving on p.o. Flagyl.  Infectious Disease physician, Dr. Ninetta Lights, is recommending discontinuing  the Zosyn, Ancef, and Flagyl today.  Hyponatremia has been addressed  with infusion of D5 water and the hyponatremia seems to be onadownward  trend.  Leukocytosis also trending down.  Hypokalemia is gently being  repleted in view of his chronic dysfunction.   Final discharge date to be determined.  Final discharge medications will  be dictated on final discharge.   RECOMMENDATIONS:  Recommendation will be to reconsult Renal, Dr. Caryn Section and  Dr. Darrick Penna, if kidney  function continues to worsen on hydration.   PHYSICAL EXAMINATION:  GENERAL:  Today, he is an elderly man not in  acute distress.  Clinically pale, not jaundiced.  VITAL SIGNS:  Temperature 98, pulse 97, respiratory rate is 27, and  blood pressure 100/67.  NECK:  Supple.  HEART:  Heart sounds 1 and 2, regular.  CHEST:  He has reduced breath sounds bibasilarly.  ABDOMEN:  Benign.  Bowel sounds present.  NEUROLOGIC:  He is alert and oriented x3.  EXTREMITIES:  Right leg is immobilized.  Peripheral pulses present.  No  edema.   The chemistry today, sodium is 158, potassium 3.4, chloride 121,  bicarbonate 21, BUN 54, creatinine 2.2, and glucose 126.   The patient will be downgraded from step-down in the next 24-48 hours  depending on clinical improvement.      Herbie Saxon, MD  Electronically Signed     MIO/MEDQ  D:  09/20/2008  T:  09/21/2008  Job:  [id number]

## 2011-02-27 NOTE — Consult Note (Signed)
NAME:  Nicholas [name], Nicholas [name] NO.:  [id number]   MEDICAL RECORD NO.:  [id number]          PATIENT TYPE:  INP   LOCATION:                               FACILITY:  [facility]   PHYSICIAN:  Burnard Bunting, M.D.    DATE OF BIRTH:  1951-05-06   DATE OF CONSULTATION:  DATE OF DISCHARGE:                                 CONSULTATION   REQUESTING PHYSICIAN:  Della Goo, MD   CHIEF COMPLAINT:  Right knee pain.   HISTORY OF PRESENT ILLNESS:  Nicholas [name] is a 60 year old patient,  currently admitted to the hospital for weakness, shortness of breath,  pneumonia, and Staph sepsis.  The patient reports weakness and shortness  of breath along with decreased appetite over the past 3 weeks.  He fell  about 3 weeks ago and has been unable to move since that time.  Actually, the patient had right total knee replacement about 10 years  ago that became infected about 5 years ago.  He again underwent 2-stage  revision and was doing well until he fell 3 weeks.  The patient denies  having any fever and chills.  He was seen in the emergency room and  diagnosed with pneumonia.  He has since admitted to the intensive care  unit.  He has developed acute on chronic kidney failure during his  hospitalization.   Past medical history is notable for:  1. History of chronic renal insufficiency.  2. Osteoarthritis.  3. Status post total knee arthroplasty in early 2000, revised several      years ago due to infection.   CURRENT MEDICATIONS:  Vicodin and Mobic.   He is allergic to [facility].   The patient does not smoke or drink.   FAMILY HISTORY:  He does have a son who lives here in [place].  He  also has an ex-wife.   All systems reviewed, are negative.   On exam, the patient's heart rate is 101, respirations were 37, and  blood pressure is 114/74.  He is in no distress and he does respond to  questions.  His right knee demonstrates a palpable pedal pulses.  He  does have a large effusion  in the right knee.  Extensor mechanism is  intact.  Range of motion is painful.  Pedal pulses are palpable.  He  does have some rash on the distal calf.  His compartments were soft.  There is no crepitus to palpation of the tissues.  No groin pain with  internal rotation of the leg.   Plain radiographs are unremarkable.  CT scan to my review shows no  fracture in the right knee.   IMPRESSION:  Staphylococcus sepsis with right knee effusion.  The  patient has methicillin-resistant Staphylococcus in his urine.  He is  currently on IV antibiotics.  Aspiration of the knee earlier today  demonstrated a purulent gross to purulent fluid.   The plan at this time is to take the patient to the operative room for  irrigation and debridement of the right knee return with an arthrotomy.  There is a grave  situation for Nicholas [name] in terms of his overall health  but in particular for that this in addition to being a life-threatening  illness, it is certainly a limb threatening problem with recurrent  infections status post revision.  Plan at this time is for him to  debride the knee to decrease bacterial load.  He will need to have a  component removal with implant spacer placement versus a component  removal in the effusion versus amputation.  Plan tonight is for incision  and drainage.  All questions answered.  Plan was discussed with the  family.      Burnard Bunting, M.D.  Electronically Signed     GSD/MEDQ  D:  08/31/2008  T:  09/01/2008  Job:  [id number]

## 2011-02-27 NOTE — Consult Note (Signed)
NAMEMarland Kitchen  Nicholas [name], Nicholas [name] NO.:  [id number]   MEDICAL RECORD NO.:  [id number]          PATIENT TYPE:  INP   LOCATION:  5021                         FACILITY:  [facility]   PHYSICIAN:  Loma Sender, MD         DATE OF BIRTH:  21-May-1951   DATE OF CONSULTATION:  DATE OF DISCHARGE:                                 CONSULTATION   ADMITTING DIAGNOSIS:  Hypokalemia.   REASON FOR CONSULTATION:  C. diff colitis.   HISTORY OF PRESENT ILLNESS:  Nicholas [name] is a 60 year old gentleman with  past medical history significant for right total knee arthroplasty in  2002, complicated by infection with numerous operations for washout over  the last 8 years, as well as prolonged hospitalization in November 2009  for MSSA sepsis, and recent hospital admission in February 2010 for  nausea, vomiting, and diarrhea diagnosed with C. diff colitis, who  presents to the hospital for scheduled antibiotic spacer, replacement of  the right knee and was found to be hypokalemic.  The patient reports  that he was having severe abdominal pain and diarrhea in November 15, 2008, at which time, he was admitted to [facility].  During  that hospitalization, he was diagnosed with C. diff colitis and was  initiated on Flagyl 500 mg t.i.d. and vancomycin 250 mg 4 times a day.  The patient reports that with induction of the antibiotics, his diarrhea  had significantly decreased.  Despite this, he still reports significant  abdominal pain, but has not been relieved with antibiotic therapy.  The  patient denies any fevers, chills, or night sweats.  He has had  significant weight loss over the last 6 months and currently resides in  a skilled nursing facility.  He denies any pain in the right knee and  reports that he has been able to stand on it recently.   PAST MEDICAL HISTORY:  1. Osteoarthritis of the right knee, status post right knee      replacement.  2. History of multiple incision and drainages of the  right knee,      status post infected hardware.  3. History of chronic renal insufficiency.  4. Gastroesophageal reflux disease.  5. Questionable history of diabetes mellitus.  6. History of MSSA bacteremia with sepsis syndrome.  7. Recent history of septic arthritis of the right knee.   CURRENT MEDICATIONS:  1. Flagyl 500 mg p.o. t.i.d. for 2 more days.  2. Vancomycin 250 mg 4 tablets daily for another 3 days.  3. Florastor 250 mg t.i.d.  4. Cholestyramine 4 g by mouth 4 times daily.  5. Protonix 40 mg daily.  6. Mag-Ox 400 mg p.o. b.i.d.  7. Potassium chloride 10 mEq p.o. daily.   ALLERGIES:  OXYCODONE, causes itching.   FAMILY HISTORY:  The patient denies any disease running in his family.   SOCIAL HISTORY:  The patient currently resides in skilled nursing  facility.  He is not married, but has 2 children.  He does not smoke,  drink, or use illicit drugs.   PHYSICAL EXAMINATION:  VITAL  SIGNS:  Temperature 97, pulse 89,  respiratory rate 18, blood pressure 117/75, and sating 98% on room air.  GENERAL:  This is a Caucasian male, in no acute distress.  HEENT:  The patient is normocephalic and atraumatic.  Extraocular  movements are intact.  Pupils are equal and reactive.  Oropharynx is  clear without erythema or exudate.  Nares are patent bilaterally.  Hearing is grossly intact.  NECK: Supple without lymphadenopathy, jugular venous distention, or  carotid bruits.  LUNGS:  Clear to auscultation bilaterally without wheezes or rhonchi.  HEART:  Regular rate and rhythm without rubs, gallops, or murmurs.  ABDOMEN:  Soft, tender to deep palpation with rebound tenderness.  Bowel  sounds are active.  NEUROLOGIC:  Cranial nerves II through XII are intact.  Muscle strength  is symmetric.  Sensation is grossly normal.  SKIN:  No rashes or lesions were appreciated.  MUSCULOSKELETAL:  There is warmth to the right knee without any erythema  or tenderness.  There is limited range of  motion, otherwise there is no  bony abnormalities, muscle tenderness, or active synovitis.  MENTAL STATUS:  The patient is alert and oriented x4.  Memory is intact  to recent or distant events.  Mood is appropriate.  Affect is full.   LABORATORY DATA:  Sodium 137, potassium 2.7, chloride 106, bicarb 24,  glucose 91, BUN 5, and creatinine 1.10.  PT, PTT shows 35 and INR 1.3 .  UA negative for leukocytes and nitrites.  CBC:  White blood cell count  7.9, hemoglobin 9.8, and platelet count 563,000.   ASSESSMENT AND PLAN:  This is a 60 year old gentleman with a past  medical history of right knee septic arthritis, who presents for  antibiotic spacer change.  He was found to be hypokalemic with a recent  history of Clostridium difficile colitis.  1. Hypokalemia.  I agree with replacement with oral regimen at this      time.  I would recommend checking the magnesium to ensure the      patient is not hypomagnesemic since this will also lead to      hypokalemia.  2. Clostridium difficile colitis.  The patient has appropriately      responded to current regimen of Flagyl and vancomycin by mouth and      scheduled to stop this treatment in 2 days.  His white count has      improved substantially since his last hospital admission going from      the 19,000 range down to 7.9.  His diarrhea has also improved      substantially per the patient report.  My only concern is that he      still is quite tender and has peritoneal signs of rebound      tenderness.  I would recommend repeat CT scan of the abdomen to      evaluate for improvement and inflammatory changes within the colon.      If inflammatory changes persist, I might consider prolonging      antibiotic dose versus discussing possible colonoscopy with      Gastroenterology.  3. Chronic renal insufficiency.  The patient's renal function appears      stable at this point.  We will monitor at this time.  4. Septic arthritis per Orthopedic  Surgery.  5. Deep venous thrombosis prophylaxis.  Recommend heparin 5000 units      subcu q.8 h. versus SCDs if the patient will be on the surgery in  the next 12 hours.      Loma Sender, MD  Electronically Signed     TJ/MEDQ  D:  12/07/2008  T:  12/08/2008  Job:  [id number]

## 2011-02-27 NOTE — Op Note (Signed)
NAMEMarland [name]  KRISTION, HOLIFIELD NO.:  [id number]   MEDICAL RECORD NO.:  [id number]          PATIENT TYPE:  INP   LOCATION:  5021                         FACILITY:  [facility]   PHYSICIAN:  Burnard Bunting, M.D.    DATE OF BIRTH:  September 25, 1951   DATE OF PROCEDURE:  12/10/2008  DATE OF DISCHARGE:  12/14/2008                               OPERATIVE REPORT   PREOPERATIVE DIAGNOSIS:  Right knee infection.   POSTOPERATIVE DIAGNOSIS:  Right knee infection.   PROCEDURE:  Right knee antibiotic spacer exchange.   SURGEON:  Burnard Bunting, M.D.   ASSISTANT:  Wende Neighbors, P.A.   ANESTHESIA:  General endotracheal.   ESTIMATED BLOOD LOSS:  Minimal.   TOURNIQUET TIME:  1 hour 25 minutes with 300 mmHg.   INDICATIONS:  Amarii is a 60 year old patient with right total knee  arthroplasty and infection.  The patient presents for antibiotic spacer  exchange.   PROCEDURE IN DETAIL:  The patient was brought to the operating room  where general endotracheal anesthesia was induced.  Preoperative IV  antibiotics were maintained.  Right leg was prepped with DuraPrep  solution including the foot and draped in sterile manner.  Collier Flowers was  used to cover the operative field.  Anterior approach to the knee was  utilized.  Medial parapatellar arthrotomy was made.  Patella was  everted.  Polyethylene spacer was removed.  Cultures were obtained.  The  tissue in general did not appear to have repeated ventilation.  There  were no over pus pockets present but in general, the tissue appeared to  be continuing ongoing infection.  Synovectomy and debridement was  performed.  Antibiotic cement was removed.  New batch of cement with 2 g  of vancomycin and 2.4 g of tobramycin was made.  Cement was then allowed  to harden and there was fashioned with femoral mold, tibial mold, and  tibial spacer.  The patient had good stability of extension, varus to  valgus stress later placed.  The cement was allowed to  harden.  Tourniquet was released.  Bleeding points were encountered and  controlled using electrocautery.  The incision was then closed using  interrupted inverted running 0 Vicryl suture, 2-0 Vicryl suture, and  skin with staples.  The patient tolerated the procedure without  immediate complications.      Burnard Bunting, M.D.  Electronically Signed    GSD/MEDQ  D:  01/20/2009  T:  01/21/2009  Job:  [id number]

## 2011-02-27 NOTE — Discharge Summary (Signed)
NAMEVALLEN, CALABRESE                ACCOUNT NO.:  [id number]   MEDICAL RECORD NO.:  [id number]          PATIENT TYPE:  INP   LOCATION:  3314                         FACILITY:  [facility]   PHYSICIAN:  Lonia Blood, M.D.DATE OF BIRTH:  1951/06/06   DATE OF ADMISSION:  08/29/2008  DATE OF DISCHARGE:                               DISCHARGE SUMMARY   DATE OF DISCHARGE:  To be determined.   INTERIM DISCHARGE DIAGNOSES:  1. Methicillin-susceptible Staphylococcus aureus sepsis/systemic      inflammatory response syndrome (SIRS).      a.     Acute pyelonephritis.      b.     Right knee septic arthritis.      c.     Left great toe septic embolization with gangrene and       subsequent amputation.      d.     Possible pneumonia.      e.     Left thumb and hand findings consistent with septic       embolization.      f.     A splinter hemorrhage of the left great toenail consistent       with septic embolization.      g.     Ongoing antibiotic therapy with Ancef per infectious disease       service.  2. Status post right total knee arthroplasty removal with placement of      antibiotic spacer September 07, 2008.  3. Status post left toe amputation just proximal to the      interphalangeal joint September 07, 2008.  4. Acute renal failure - secondary to septic shock - actively      resolving.  5. Hepatic transaminitis - felt to be secondary to shock liver -      improving.  6. Toxic metabolic encephalopathy with evidence of small      nonhemorrhagic infarct (questionable septic emboli) with anterior      right periopercular region.  7. Subacute persistent dysphagia.  8. Hyperglycemia.  9. Hypernatremia - resolved.  10.Multifactorial anemia.   DISCHARGE MEDICATIONS:  To be determined at the actual time of  discharge.   CONSULTATIONS:  1. Dr. Dorene Grebe.  2. Dr. Deterding/Dr. Caryn Section with [facility].  3. Dr. Marca Ancona with cardiology.   ADDITIONAL PROCEDURES:  1.  Right total knee arthroplasty removal with placement of antibiotic      spacer September 07, 2008.  2. Left great toe amputation just proximal to interphalangeal joint      September 07, 2008.   ADDENDUM TO HOSPITAL COURSE:  1. Disseminated methicillin-sensitive staph aureus sepsis,      pyelonephritis, cutaneous embolization, possible CNS embolization,      septic arthritis.  The patient continues to display evidence of      clinical resolution/improvement.  Antibiotics continued to be      directed by the ID service.  Since the last interim summary, the      patient's right knee hardware has been completely removed with      placement of an antibiotic  spacer.  The wound appears to be stable.      Additionally, the patient has undergone a left great toe amputation      due to gangrene which is now clinically felt to have been the      result of probable septic embolization.  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      There is no evidence of an active vegetation at the present time as      noted previously via TEE.  Antibiotic therapy is to continue as      directed by the ID service, and wound care is being attended to by      the orthopedic service.  2. Left great toe gangrene - the patient is now status post amputation      as discussed above.  The wound is healing nicely, and wound care is      being provided by the orthopedic service.  3. Possible endocarditis - as discussed above.  Clinically there is      high suspicion that the patient was actually suffering with      endocarditis prior to his admission.  At this time, however, the      patient is clinically much improved, and follow-up TEE has failed      to reveal any evidence of active agitation presently.  Antibiotic      course is as  per the ID service.  4. Acute renal failure - the patient's creatinine has continued to      slowly improve.  We continue to assure that the patient is hydrated      and avoids nephrotoxins.  At the time of this dictation creatinine      has decreased to 1.94, and there is every indication that it will      continue to improve.  5. Hepatic transaminitis - the patient's transaminitis continues to      improve with hydration as well.  We will recheck transaminases in      the morning.  The patient's elevated transaminases are felt to be      likely the result of shock liver and sepsis related to his      methicillin-susceptible staph aureus.  We will continue to hydrate      the patient, and avoid serious hepatotoxins while we follow the      trend of his LFTs.  6. Toxic metabolic encephalopathy with recent small nonhemorrhagic      infarct - unfortunately the patient's mental status has not      recovered.  There is concern that the patient suffered multiple      showering emboli to the brain as evidenced by the small right      periopercular region CVA noted on MRI.  Other metabolic etiologies      for his altered mental status have been ruled out.  We will      continue to follow the patient's mental status, but we are hopeful      at this point that he will significantly improve from this      standpoint.  7. Dysphagia - the patient continues to exhibit significant dysphagia.      Ongoing speech and language pathology evaluation is occurring.  We  will happily advance his diet at such time that he has proven to be      safe for such, but unfortunately at this point he has not yet been      able to tolerate advancement of his diet.  8. Normocytic anemia - this is most likely related to both his severe      systemic infection as well as his acute renal insufficiency.      Lonia Blood, M.D.  Electronically Signed     JTM/MEDQ  D:  09/13/2008  T:  09/13/2008  Job:   [id number]

## 2011-02-27 NOTE — Discharge Summary (Signed)
NAMEMarland Kitchen  Nicholas, [name] NO.:  [id number]   MEDICAL RECORD NO.:  [id number]          PATIENT TYPE:  INP   LOCATION:  5034                         FACILITY:  [facility]   PHYSICIAN:  Burnard Bunting, M.D.    DATE OF BIRTH:  25-Apr-1951   DATE OF ADMISSION:  03/29/2009  DATE OF DISCHARGE:  04/03/2009                               DISCHARGE SUMMARY   ADMISSION DIAGNOSES:  1. Status post right total knee arthroplasty, treated x2 for      infection, now with chronic infection requiring right knee      effusion.  2. History of Clostridium difficile colitis, on chronic vancomycin and      Flagyl.  3. History of previous methicillin-resistant staph aureus infection,      right knee.  4. History of septic arthritis.  5. Pyelonephritis.  6. Chronic renal insufficiency.  7. History of depression.  8. Diabetes mellitus.   DISCHARGE DIAGNOSES:  1. Status post right total knee arthroplasty, treated x2 for      infection, now with chronic infection requiring right knee      effusion.  2. History of Clostridium difficile colitis, on chronic vancomycin and      Flagyl.  3. History of previous methicillin-resistant staph aureus infection,      right knee.  4. History of septic arthritis.  5. Pyelonephritis.  6. Chronic renal insufficiency.  7. History of depression.  8. Diabetes mellitus.  9. Posthemorrhagic anemia requiring multiple blood transfusions.   PROCEDURE:  On March 29, 2009, the patient underwent removal of  antibiotic spacers to the right knee and placed in right knee effusion,  utilizing intermedullary nail and allograft to one graft.  This was  performed by Dr. August Saucer assisted by Maud Deed, [facility] under general  anesthesia.   CONSULTATIONS:  Charlcie Cradle. Delford Field, MD, FCCP   BRIEF HISTORY:  The patient is a 60 year old male with recurring  infections of the right knee treated with removal of hardware and  antibiotic spacers for methicillin-resistant staph aureus.  He  is on  chronic antibiotic treatment.  It was felt he would require a right knee  effusion secondary to significant bone loss and incompetency of the  extensor mechanism and only one collateral ligament.  The patient was  admitted for this procedure.   BRIEF HOSPITAL COURSE:  The patient underwent the procedure under  general anesthesia.  He had as much as 1000 mL of blood loss.  Consult  was obtained through Dr. Delford Field to rule out coagulopathy.  He received  multiple blood products including packed red blood cells x7,  pheresed  platelets x1 unit and fresh frozen plasma x2 units.  At discharge,  hemoglobin was noted to be 7.9 with hematocrit of 23.4.  The patient was  asymptomatic from this value.  The patient was in the Step-Down Unit for  a couple of nights and then was transferred to the Orthopedic Unit where  he was started on physical therapy for ambulation and gait training.  He  was also started on Coumadin for DVT prophylaxis.  The patient was  allowed 50% partial weightbearing to the right lower extremity and  utilized a walker for ambulation.  He was able to stand and pivot and  eventually able to ambulate 30 feet prior to discharge.  His wounds were  healing during the hospital stay without signs of infection.  The  patient was comfortable on p.o. analgesics.  He was taking a regular  diet.  At discharge, INR was 2.6.  Chemistry studies were within normal  limits.  Calcium as low as 7.6 during the hospitalization.  Total  protein of 4.9, albumin 2.4, total bilirubin 2.1, and phosphorus 5.2.  Urine cultures showed no growth.  Synovial culture showed no growth.   PLAN:  The patient was discharged to his home.  Arrangements were made  for Home Health Services to assist with physical therapy, Coumadin  management, and a nurses aide.  He will continue to receive physical  therapy for ambulation and gait training and be allowed 50% partial  weightbearing on the operative extremity.   He is instructed to keep his  incision dry and clean at all times.  He is instructed to follow up with  Dr. August Saucer 2 weeks from surgery.  He will continue home medications as  taken prior to admission and is given prescription for Coumadin 5 mg  daily, Robaxin 500 mg p.o. q.8 h. p.r.n. spasm, and iron supplement  daily.  The patient was advised to call the office should he have  questions or concerns prior to his return office visit.   CONDITION ON DISCHARGE:  Stable.      Wende Neighbors, P.A.      Burnard Bunting, M.D.  Electronically Signed    SMV/MEDQ  D:  05/12/2009  T:  05/13/2009  Job:  [id number]

## 2011-02-27 NOTE — Discharge Summary (Signed)
NAMELESHAWN, STRAKA NO.:  [id number]   MEDICAL RECORD NO.:  [id number]          PATIENT TYPE:  INP   LOCATION:  6705                         FACILITY:  [facility]   PHYSICIAN:  Theodosia Paling, MD    DATE OF BIRTH:  [birth year]   DATE OF ADMISSION:  08/28/2008  DATE OF DISCHARGE:  09/29/2008                               DISCHARGE SUMMARY   ADDENDUM:  Addendum to the discharge summary dictated by Dr. Darden Palmer on September 28, 2008.  Please refer to the extensive discharge  summary previously dictated on December 7 by Dr. Christella Noa and November  30 by Dr. Shirlee Latch and progress note by interim discharge summary of Dr.  Elliot Cousin on November 22.  The patient has following discharge  diagnoses:   DISCHARGE DIAGNOSES:  1. Disseminated MSSA (methicillin sensitive Staphylococcus aureus)      sepsis SIRS (systemic inflammatory response syndrome) with acute      pyelonephritis.  2. Right knee septic arthritis status post removal of the hardware.  3. Left great toe septic embolization with gangrene and subsequent      embolization.  4. Possible pneumonia.  5. Left thumb and hand finding consistent with septic embolization.  6. Splinter hemorrhage of the left great toenail consistent with      septic embolization.  7. Ongoing antibiotic therapy with Ancef to be completed on December      13 to complete 14 more days and in total 42 days of IV antibiotics      for disseminated MSSA infection.  8. Status post right total knee arthroplasty with placement of      antibiotic spacer on September 07, 2008.  9. Status post left toe amputation proximal interphalangeal joint      September 07, 2008.  10.Acute renal failure secondary to shock which is resolved now.  11.Hepatic transaminitis again secondary to shocked liver now      resolved.  12.Toxic metabolic encephalopathy with evidence of small      nonhemorrhagic infarct questionable septic emboli.  13.Subacute dysphagia  which is now resolved.  14.Hyperglycemia.  15.Hypernatremia which is now resolved.  16.Multifactorial anemia with stable H and H.   DISCHARGE MEDICATIONS:  1. Lantus insulin 10 units subcu q.12 hours.  2. Sliding scale insulin for fingerstick glucose 101-150 one unit, 151-      200 two units, 201-250 three units, 251-300 five units, 301-350      seven units, 351-400 nine units.  3. Senokot 1 tablet p.o. q.h.s. p.r.n.  4. Lactobacillus acidophilus bulgaricus one packet p.o. q.6 hours.  5. Aspirin 81 mg enteric coated p.o. daily.  6. Flagyl 500 mg p.o. q.8 hours.  7. Protonix 40 mg p.o. q.12 hours.  8. Ensure Plus 1 can t.i.d.  9. Albuterol 2.5 mg nebs q.6 hours p.r.n.  10.Dulcolax 10 mg suppository as needed every day.  11.Daptomycin 650 mg IV q.24 hours for 14 more days, the last day will      be October 13, 2008.  12.Metronidazole 500 mg p.o. q.8 hours for 8 days, the last day will  be October 07, 2008.   DISPOSITION:  The patient is getting transferred to skilled nursing  facility for physical therapy and completion of IV antibiotics.  The  patient's CBC needs to be closely followed for anemia and  thrombocytopenia at least twice a week until the duration of  antibiotics.  Total time spent in discharge of the patient was 45  minutes.      Theodosia Paling, MD  Electronically Signed     NP/MEDQ  D:  09/29/2008  T:  09/29/2008  Job:  [id number]   cc:   Melvern Banker  Lacretia Leigh Ninetta Lights, M.D.  Dineen Kid Reche Dixon, M.D.

## 2011-02-27 NOTE — Op Note (Signed)
NAMEMarland Kitchen  Nicholas [name], Nicholas [name] NO.:  [id number]   MEDICAL RECORD NO.:  [id number]          PATIENT TYPE:  INP   LOCATION:  5021                         FACILITY:  [facility]   PHYSICIAN:  Graylin Shiver, M.D.   DATE OF BIRTH:  [birth year]   DATE OF PROCEDURE:  12/09/2008  DATE OF DISCHARGE:                               OPERATIVE REPORT   PROCEDURE:  Flexible sigmoidoscopy with biopsy.   INDICATIONS FOR PROCEDURE:  History of Clostridium difficile, recent CT  scan showed evidence of colitis.  The patient has been on therapy for  his Clostridium difficile and we are looking to see if there is evidence  of colitis or pseudomembranes on this examination.   Informed consent was obtained after explanation of the risks of  bleeding, infection, and perforation.   PREMEDICATIONS:  Fentanyl 50 mcg IV and Versed 5 mg IV.   PROCEDURE IN DETAIL:  With the patient in the left lateral decubitus  position, the Pentax colonoscope was inserted into the rectum and  advanced into the sigmoid colon.  It was advanced up into the descending  colon.  The descending colon, sigmoid, and rectum all looked possibly a  little edematous as far as the mucosa was concerned, but there was no  evidence of active colitis.  No erosions or pseudomembranes and some  biopsies were obtained for histology.  He tolerated the procedure well  without complications.   IMPRESSION:  No evidence of active colitis or pseudomembranes on this  examination.   PLAN:  We will await the Clostridium difficile toxin.           ______________________________  Graylin Shiver, M.D.     SFG/MEDQ  D:  12/09/2008  T:  12/10/2008  Job:  [id number]

## 2011-02-27 NOTE — H&P (Signed)
NAMEMarland Kitchen  [name], [name] NO.:  [id number]   MEDICAL RECORD NO.:  [id number]          PATIENT TYPE:  INP   LOCATION:  5021                         FACILITY:  [facility]   PHYSICIAN:  Burnard Bunting, M.D.    DATE OF BIRTH:  05-30-1951   DATE OF ADMISSION:  12/07/2008  DATE OF DISCHARGE:  12/14/2008                              HISTORY & PHYSICAL   CHIEF COMPLAINT:  Right knee infection.   HISTORY OF PRESENT ILLNESS:  Nicholas [name] is a 60 year old patient with  right knee infection.  He has had a long arduous postoperative course  following his knee replacement originally in early 2000s.  He had a knee  infection originally, became infected in 2005.  He had 2-stage exchange  and did well until a fall recently.  He was hospitalized with  overwhelming sepsis and had multiple organ system involvement.  He was  taken to the OR at that time for total knee arthroplasty removal and  placement of antibiotic spacer.  He presents now for second antibiotic  spacer placement after failure of infection eradication after the first  stage.  The patient's potassium was noted to be on the day of  anticipated surgery at 2.7.  Surgery was, therefore, cancelled and he is  admitted now to the Orthopedic Service for medical management and  treatment prior to surgery.   ALLERGIES:  PERCOCET.   MEDICAL HISTORY:  1. History of C. diff colitis.  He is nursing home resident.  2. Prior MRSA.  3. Septic arthritis.  4. Pyelonephritis.  5. He had a right TKA revision secondary to infection in September      2009.  6. History of left great toe amputation at that time as well.   MEDICATIONS:  Questran, Lantus insulin, Protonix, Celexa, __________.  he is currently on p.o. vancomycin and Flagyl at the facility.   PHYSICAL EXAMINATION:  GENERAL:  He is well developed.  He has lost some  weight since I have seen him last.  He is alert and oriented.  CHEST:  Clear to auscultation.  abdomen__________:   Benign.  EXTREMITIES:  Right knee demonstrates well-healed surgical incision with  no effusion.  Pedal pulses are palpable.   IMPRESSION:  Right total knee arthroplasty infection who needs  antibiotic spacer exchange.   PLAN:  For admission and medical management and later surgery.  Once his  medical condition improves, Medical Team was called on the day of  admission.      Burnard Bunting, M.D.  Electronically Signed     GSD/MEDQ  D:  01/20/2009  T:  01/21/2009  Job:  [id number]

## 2011-02-27 NOTE — Discharge Summary (Signed)
[name], Nicholas                ACCOUNT NO.:  [id number]   MEDICAL RECORD NO.:  [id number]          PATIENT TYPE:  INP   LOCATION:  6705                         FACILITY:  [facility]   PHYSICIAN:  Beckey Rutter, MD  DATE OF BIRTH:  22-Nov-1950   DATE OF ADMISSION:  08/28/2008  DATE OF DISCHARGE:                               DISCHARGE SUMMARY   ADDENDUM   PRIMARY CARE PHYSICIAN:  Unassigned.   This is an addendum to the previously dictated discharge summation on  December 7 by Dr. Christella Noa and on November 30 by Dr. Sharon Seller as well as  the discharge summary dictated as progress note by Dr. Elliot Cousin on  November 22.  For the last week I was attending to Nicholas [name] who showed  improvement in terms of feeding.  The patient was taking his NG tube for  feeding, but after discontinuation of the scopolamine the patient was  progressed to regular food currently and the patient had a modified  barium swallow which was almost normal.  Currently he is able to  tolerate pureed food because the patient does not have his dentures, but  otherwise he would be able to eat regular food.   PROBLEM:  1. Severe deconditioning which also improved.  The patient is able to      use both his arms and generally he showed more animation.  The      patient is followed by physical therapy and occupational therapy      and rehab consultation was obtained.  The patient could go to the      rehab or to the skilled nursing facility depending on his      condition.  The patient has staples removed by orthopedic service      from the right knee.  2. The patient was diagnosed with Clostridium difficile colitis and he      was started on Flagyl.  He would need to continue Flagyl for 8 more      days.   This is an interim discharge summary for the medical issue for the last  week.  This will be followed by final discharge summary once the patient  is stable enough for discharge.   The patient's condition was  discussed several times in several days with  his son Nicholas [name] at the number [phone].      Beckey Rutter, MD  Electronically Signed     EME/MEDQ  D:  09/28/2008  T:  09/28/2008  Job:  [id number]

## 2011-02-27 NOTE — Consult Note (Signed)
NAMEKALUM, MINNER NO.:  [id number]   MEDICAL RECORD NO.:  [id number]          PATIENT TYPE:  INP   LOCATION:  2309                         FACILITY:  [facility]   PHYSICIAN:  Charlcie Cradle. Delford Field, MD, FCCPDATE OF BIRTH:  1951-02-22   DATE OF CONSULTATION:  03/29/2009  DATE OF DISCHARGE:                                 CONSULTATION   CHIEF COMPLAINT:  Postop hemorrhage with chronic right knee infection.   HISTORY OF PRESENT ILLNESS:  This is a complex 60 year old male who has  had preexisting history of right total knee replacement that originated  in December 2002.  He then had removal of right knee arthroplasty with  antibiotic spacer placement in June 2007.  He had a right total knee  arthroplasty revision in September 2007.  He then had right knee  arthrotomy with I&D and synovectomy on August 31, 2008, and finally  the second right knee arthroplasty was removed with antibiotic spacer  placement and subsequent revision in November 2009.  He had an  antibiotic spacer exchange in February 2010 and now comes in today with  recurrent infection and effusion in the right knee and had a yet again  drainage of the effusion and replacement of the antibiotic spacer  device.  Intraop and postop, he has had significant drainage and  bleeding from this area.  He has required 4 units of packed cells 1 unit  platelet pack, and 2 units of fresh frozen plasma.  There is still  significant drainage exuding from the knee, previously had lost 1600 mL  estimated blood loss intraop.  This patient is now admitted to the ICU  for postoperative monitoring.  Critical Care is asked to assist in this  monitoring process.   PAST MEDICAL HISTORY:  1. History of depression.  2. History of C. diff colitis.  He is a chronic nursing home resident      and he is on chronic vancomycin and Flagyl.  3. History of previous MRSA infection of the right knee.  4. History of septic arthritis.  5.  Pyelonephritis.  6. Chronic renal insufficiency.  7. Left great toe amputation with previous total knee arthroplasty      revision in the past.  8. Tendency towards hyperglycemia and diabetes type 2 was noted.   ALLERGIES:  PERCOCET.   MEDICATIONS PRIOR TO ADMISSION:  1. Sliding scale insulin.  He is off the Lantus.  2. He also has Protonix 40 mg b.i.d.  3. Potassium daily.  4. Celexa 10 mg daily.  5. Florastor 250 mcg t.i.d.  6. Magnesium oxide 400 mg b.i.d.  7. Flagyl and vancomycin orally.  8. Cholestyramine 4 times daily.   SOCIAL HISTORY:  Otherwise, noncontributory.  He does live in a nursing  home setting.  His primary care physician had been Dr. Reche Dixon of  [facility].  He is a nonsmoker and nondrinker.   FAMILY HISTORY:  Noncontributory.   REVIEW OF SYSTEMS:  Twelve-point review of systems is taken in details,  otherwise noncontributory.   PHYSICAL EXAMINATION:  VITAL SIGNS:  Currently blood pressure 110/80,  respirations 12, saturation 100%, pulse 80 sinus rhythm.  CHEST:  Distant breath sounds.  No wheeze, rale, or rhonchi.  CARDIAC:  Regular rate and rhythm without S3.  Normal S1 and S2.  ABDOMEN:  Soft, nontender.  EXTREMITIES:  There is a dressing over the right knee.  There is a  drainage device coming out of the knee with blood coming from this.  Left extremity is unremarkable.  NEUROLOGIC:  The patient is awake and alert.  He is on nasal cannula in  no acute distress.   LABORATORY DATA:  Hemoglobin was 9.2 from i-stat in the OR.  No other  lab values are available as of yet.   IMPRESSION:  Recurrent right knee infection status post previous total  knee replacement x2 with resection of the arthroplasties and antibiotic  spacer placement on this admission with effusion and drainage, postop  hemorrhage and intraop hemorrhage, rule out coagulopathy, previous  history of pneumonia and sepsis, depression, and then type 2 diabetes as  noted.    RECOMMENDATIONS:  We will give antibiotics intravenously per Orthopedics  have been already ordered to include Ancef IV.  The patient will also  receive pain control per Orthopedics.  Consider Infectious Disease  consult.  We will check coagulopathy, routine labs, check chest x-ray,  and place on ICU hyperglycemia protocol, admit to the ICU and assist  with monitoring.      Charlcie Cradle Delford Field, MD, [facility]  Electronically Signed     PEW/MEDQ  D:  03/29/2009  T:  03/30/2009  Job:  [id number]   cc:   Rise Paganini, MD  [facility]

## 2011-02-27 NOTE — Op Note (Signed)
NAMEZECHARIAH, Nicholas [name]                ACCOUNT NO.:  [id number]   MEDICAL RECORD NO.:  [id number]          PATIENT TYPE:  INP   LOCATION:  3314                         FACILITY:  [facility]   PHYSICIAN:  Burnard Bunting, M.D.    DATE OF BIRTH:  1951-08-21   DATE OF PROCEDURE:  09/07/2008  DATE OF DISCHARGE:                               OPERATIVE REPORT   PREOPERATIVE DIAGNOSES:  1. Right knee infection.  2. Left great toe infection.   POSTOPERATIVE DIAGNOSES:  1. Right knee infection.  2. Left great toe infection.   PROCEDURE:  1. Right total knee arthroplasty removal with placement of antibiotic      spacer.  2. Left toe amputation just proximal to the interphalangeal joint.   SURGEON:  Burnard Bunting, M.D.   ASSISTANT:  None.   ANESTHESIA:  General endotracheal.   ESTIMATED BLOOD LOSS:  10 mL for the toe and 100 mL for the right knee.   SPECIMENS:  Culture x1 sent from the knee.  Toe specimen was sent from  the left great toe.   PROCEDURE IN DETAIL:  The patient was brought to the operating room  where general endotracheal anesthesia was induced.  Preoperative  antibiotic was administered.  A time-out was called.  The right leg was  prepped with yellow Hibiclens type prep and draped in the sterile  manner.  Anterior leg was elevated, but not exsanguinated.  Tourniquet  was inflated.  Total tourniquet time was approximately an hour and a  half.  The anterior approach of the knee was utilized.  Skin and  subcutaneous tissue was sharply divided.  A median parapatellar  arthrotomy was made.  Patellar tendon was partially peeled off the  tibial tubercle.  The infection still remained present.  Cultures were  obtained.  Poly was removed with collaterals and posterior neurovascular  structures protected.  The femoral component was extracted.  The cement  mantle was extracted with the prosthesis.  The tibial component was then  extracted with a little bit more difficulty.  Thorough  incisional  debridement was then performed with the synovectomy and debridement of  the medial pocket where his prior surgery was many years ago.  Following  meticulous debridement and removal of all visible cement, the knee joint  was thoroughly irrigated with 9 liters of pulsatile lavage solution.  Thrombin-coated sponge was then placed into the canal and the tourniquet  was released.  Bleeding points were encountered and controlled with  electrocautery.  Antibiotic cement was then fashioned on the femur and  tibia with a 20-mm spacer with four bags of vancomycin per four bags of  cement.  The knee had reasonable alignment and stability following  antibiotic spacer placement.  Knee was then closed over Hemovac drain,  which will be removed in 12 hours.  The incision was then closed using  interrupted inverted #1 Vicryl suture, 0 Vicryl suture, 2-0 Vicryl  suture, and skin staples.  Bulky dressing and knee immobilizer were  placed.  At this time, attention was then directed towards the toe.  Prepping was then performed  with the yellow Hibiclens type prepping  solution.  There was a dead tissue on the lateral side of the great toe.  This tissue was debrided back to bone to the side of the stump to  amputate through the proximal phalanx.  Skeletonization of the tissue  off the bone was performed and the medial flap was then turned over the  tibia lateral side.  Skin was cut back to help the healthy viable-  appearing tissue.  The specimen was sent to pathology.  At this time,  the incision was irrigated and closed using 2-0 Vicryl suture and 3-0  nylon.  Bulky dressing was applied.  The patient tolerated the procedure  well without immediate complications.  He was transferred to the  recovery room in stable condition.  It should be noted that Velna Hatchet  Vernon's assistance was required all the time during the knee removal  portion of the case.  Her assistance was a medical necessity.       Burnard Bunting, M.D.  Electronically Signed     GSD/MEDQ  D:  09/07/2008  T:  09/08/2008  Job:  [id number]

## 2011-02-27 NOTE — Op Note (Signed)
NAMEJAUAN, WOHL                ACCOUNT NO.:  [id number]   MEDICAL RECORD NO.:  [id number]          PATIENT TYPE:  INP   LOCATION:  2107                         FACILITY:  [facility]   PHYSICIAN:  Burnard Bunting, M.D.    DATE OF BIRTH:  [birth year]   DATE OF PROCEDURE:  08/31/2008  DATE OF DISCHARGE:                               OPERATIVE REPORT   PREOPERATIVE DIAGNOSIS:  Right knee infection.   POSTOPERATIVE DIAGNOSIS:  Right knee infection.   PROCEDURE:  1. Right knee arthrotomy with I and D.  2. Synovectomy.   SURGEON:  Burnard Bunting, MD   ASSISTANT:  None.   ANESTHESIA:  General endotracheal.   ESTIMATED BLOOD LOSS:  75 mL.   INDICATIONS:  Nicholas [name] is a 60 year old patient with right knee pain  and infection status post fall 3 weeks ago.  He has a history of  previously infected and treated total knee replacement.  He presents now  for proper management of overwhelming infection in the right knee after  aspiration.   PROCEDURE IN DETAIL:  The patient was brought to the operating room  where general anesthesia was induced.  Time out was called.  Right leg  was prepped with [employer], draped in a sterile manner, fluids  included.  Anterior approach to the knee was utilized through the prior  incision.  Skin and subcutaneous tissue were sharply divided.  Medial  parapatellar arthrotomy was made.  Abundant purulence was noted within  the knee joint.  Did actually expanded into the medial tissues through a  vent in the capsule on the medial side near an area of a prior incision.  This fluid was sent for culture and analysis.  A synovectomy was  performed, 9 liters of pulsatile irrigation was then irrigated through  the knee.  Although, the patient's coags were normal.  He had bleeding  following synovectomy was poorly controlled with electrocautery and  therefore Proderm spray was utilized.  Hemovac drain was placed.  Knee  was then placed using interrupted #1 Vicryl  sutures and 0 Vicryl suture  and 3-0 nylon.  The patient tolerated the procedure without immediate  complications.  It should be noted that prior to the procedure, his left  knee was noted to have significant effusion.  This was aspirated and  that fluid was clear yellow, but also sent for Gram-stain, crystal  analysis, culture, and cell count.     Burnard Bunting, M.D.  Electronically Signed    GSD/MEDQ  D:  08/31/2008  T:  09/01/2008  Job:  [id number]

## 2011-02-27 NOTE — H&P (Signed)
Nicholas [name], DESMITH NO.:  [id number]   MEDICAL RECORD NO.:  [id number]          PATIENT TYPE:  INP   LOCATION:  1513                         FACILITY:  [facility]   PHYSICIAN:  Della Goo, M.D. DATE OF BIRTH:  1951-02-21   DATE OF ADMISSION:  11/22/2008  DATE OF DISCHARGE:                              HISTORY & PHYSICAL   PRIMARY CARE PHYSICIAN:  [facility], Dr. Baltazar Najjar.   CHIEF COMPLAINT:  Nausea, vomiting and diarrhea.   HISTORY OF PRESENT ILLNESS:  This is a 60 year old male who was brought  to the emergency department from the [facility] nursing home facility  secondary to worsening nausea, vomiting, diarrhea and abdominal pain.  The patient has had symptoms of nausea, vomiting, diarrhea and crampy  abdominal pain for 1 week.  He was seen in the emergency department 1  day before, evaluated and diagnosed with C difficile colitis and started  on Flagyl treatment and returned to the nursing home.  The patient was  sent back to the emergency department the next day for further  evaluation, treatment and admission secondary to continued and worsening  symptoms.  There was no report of the patient having fevers or chills.  The patient denies having any hematemesis, hematochezia or melena  passage.   PAST MEDICAL HISTORY:  1. Right total knee replacement followed by a right total knee      revision secondary to infection on June 25, 2008.  2. Right knee arthrotomy with incision and drainage and synovectomy on      August 31, 2008.  3. Right total knee arthroplasty removal with placement of antibiotic      spacer.  4. Left great toe amputation below the PIP joint.  5. Chronic renal insufficiency/end-stage renal disease.   ALLERGIES:  PERCOCET which caused itching; however, the patient is able  to tolerate oxycodone and is currently on OxyIR.   MEDICATIONS:  1. Celexa 20 mg 1 p.o. daily.  2. Lactinex 1 tablet p.o. t.i.d.  3.  OxyIR 5 mg 1-2 tablets p.o. q.4 h., p.r.n. pain.  4. Lantus insulin 10 units subcu b.i.d.  5. Aspirin 81 mg 1 p.o. daily.  6. Mechanical soft diet with thin liquids.   SOCIAL HISTORY:  The patient is a nursing home resident.  He is in  rehabilitation therapy.  The patient is a nonsmoker, nondrinker and  denies any illicit drug usage.   FAMILY HISTORY:  Noncontributory.   PHYSICAL EXAMINATION:  GENERAL:  This is a thin 60 year old male in  discomfort but no acute distress.  VITAL SIGNS:  Temperature 98.9, blood pressure 112/76, heart rate 111,  respirations 24.  O2 saturations 96% on room air.  HEENT:  Normocephalic, atraumatic.  Pupils are reactive to light  bilaterally.  Extraocular movements are intact.  Funduscopic benign.  Oropharynx is clear.  No exudates or erythema.  NECK:  Supple.  Full range of motion.  No thyromegaly, adenopathy or  jugulovenous distention.  CARDIOVASCULAR:  Regular rate and rhythm with  mild tachycardia.  No murmurs, gallops or rubs.  LUNGS:  Clear to  auscultation bilaterally.  ABDOMEN:  Positive bowel sounds.  Soft, nontender, nondistended.  EXTREMITIES:  Without cyanosis, clubbing or edema.  NEUROLOGIC:  Alert and oriented x3.  There are no focal deficits.   LABORATORY STUDIES:  White blood cell count 19.7, hemoglobin 10.0,  hematocrit 30.5, neutrophils 87%, lymphocytes 5% and platelets 638.  Sodium 134, potassium 3.8, chloride 101, bicarb 21, BUN 21, creatinine  1.60 and glucose 125.  C difficile toxin was performed on November 21, 2008, results of which were positive.   ASSESSMENT:  A 60 year old male being admitted with;  1. Clostridium difficile colitis.  2. Nausea, vomiting and diarrhea.  3. Tachycardia.  4. Anemia.  5. Decubitus ulcers.   PLAN:  The patient will be admitted and placed on IV metronidazole  therapy.  IV fluids have been ordered for maintenance therapy and fluid  resuscitation.  The patient's regular medications will be  continued and  DVT and GI prophylaxis have been ordered.  A 3-way abdomen will also be  ordered.      Della Goo, M.D.  Electronically Signed     HJ/MEDQ  D:  11/24/2008  T:  11/24/2008  Job:  [id number]   cc:   Maxwell Caul, M.D.

## 2011-03-02 NOTE — Procedures (Signed)
. [facility]  Patient:    Nicholas [name], Nicholas [name] Visit Number: [id number] MRN: [id number]          Service Type: SUR Location: [id number] Attending Physician:  Burnard Bunting Dictated by:   Edwin Cap. Zoila Shutter, M.D. Proc. Date: 11/04/01 Admit Date:  11/04/2001   CC:         [name], M.D.  Department of Anesthesia   Procedure Report  HISTORY:  [name] is a 60 year old white male with a diagnosis of postoperative arthrofibrosis, who is scheduled for an arthroscopic debridement of his knee and for whom epidural analgesia in the postoperative period has been requested, explained, understood, and accepted by the patient in the preoperative period.  DESCRIPTION OF PROCEDURE:  At the termination of surgery while still under general anesthesia, [name] is turned to the right lateral decubitus position.  His back is prepped with Betadine and draped in the usual sterile fashion.  Using midline approach and loss of resistance technique, a peridural tap is accomplished at the L1-2 interspace with a 17-gauge Tuohy needle. After aspiration of blood and CSF was negative, a total of 10 cc of 0.5% lidocaine plus 100 mcg of fentanyl was infused with no problems.  This was followed by the passage of a peridural catheter 15 cm cephalad.  The catheter was then checked for patency and affixed to the patients back.  The patient was then returned to the supine position, awakened, and brought to the postanesthesia care unit, where his peridural catheter will be connected to an infusion with a mixture of fentanyl 5 mcg/cc and Marcaine 1/16 percent at an initial rate of 12 cc/hr. to be adjusted as needed.  There were no complications.  He did well and will be followed in the usual fashion. Dictated by:   Edwin Cap. Zoila Shutter, M.D. Attending Physician:  Burnard Bunting DD:  11/04/01 TD:  11/06/01 Job: [id number]

## 2011-03-02 NOTE — Op Note (Signed)
NAMEMarland Kitchen  DEKARI, BURES NO.:  [id number]   MEDICAL RECORD NO.:  [id number]          PATIENT TYPE:  INP   LOCATION:  5032                         FACILITY:  [facility]   PHYSICIAN:  Burnard Bunting, M.D.    DATE OF BIRTH:  [birth year]   DATE OF PROCEDURE:  DATE OF DISCHARGE:  04/11/2006                                 OPERATIVE REPORT   PREOPERATIVE DIAGNOSIS:  Right infected total knee replacement.   POSTOPERATIVE DIAGNOSIS:  Right infected total knee replacement.   PROCEDURE:  Removal of components right infected total knee with placement  of antibiotic spacer.   SURGEON:  Burnard Bunting, M.D.   ASSISTANT:  Jerolyn Shin. Tresa Res, M.D.   ANESTHESIA:  General anesthesia.   ESTIMATED BLOOD LOSS:  300 mL.   SPECIMENS:  Cultures obtained x4.   PROCEDURE INDICATIONS:  Nicholas [name] is a 60 year old patient with infected  right total knee replacement who presents now for removal.   PROCEDURE IN DETAIL:  The patient was brought to the operating room, where  general endotracheal anesthesia was induced and after proper anesthesia was  induced, the patient's leg was elevated, but not exsanguinated, and the  tourniquet was inflated.   The skin and subcutaneous tissue were sharply divided.  The patient had a  significant stiffness in the total knee.  Medial parapatellar arthrotomy was  made.  Lateral release was required.  Significant scar tissue was removed  from the total knee replacement.  Patella was inverted.  Using a combination  of oscillating saw and osteotomes, the polyethylene spacer tibia and femur  were removed.  In a similar fashion, the patella was removed.  Bony surfaces  were irrigated and debridement was performed of devitalized tissue.  A  complete synovectomy was performed with protection of the neurovascular  structures, performed by Dr. Tresa Res.  Following total synovectomy, then  the removal of all components, the knee was thoroughly irrigated using 9  liters of pulsatile lavage.  Tourniquet was released.  Bleeding points were  closed with electrocautery.  Cement spacer was fashion for the tibia and  femur with  a polyethylene articulating spacer placed in between.  Incision was closed  over a drain using running monofilament suture followed by skin staples and  reapproximated skin edges.  The patient tolerated the procedure well,  without any immediate complications.           ______________________________  G. Dorene Grebe, M.D.     GSD/MEDQ  D:  05/12/2006  T:  05/12/2006  Job:  [id number]

## 2011-03-02 NOTE — Discharge Summary (Signed)
[place]. [facility]  Patient:    Nicholas [name], Nicholas [name] Visit Number: [id number] MRN: [id number]          Service Type: SUR Location: 5000 [id number] 01 Attending Physician:  Burnard Bunting Dictated by:   [name], M.D. Admit Date:  09/18/2001 Discharge Date: 09/23/2001                             Discharge Summary  DISCHARGE DIAGNOSIS:  Right knee arthritis.  OPERATIONS:  Right total knee arthroplasty on 09/18/01.  HISTORY OF PRESENT ILLNESS:  The patient is a 60 year old patient with right knee arthritis refractory to conservative management who presents now for total knee arthroplasty.  HOSPITAL COURSE:  The patient was admitted to the orthopedic service on 09/18/01.  At that time, he underwent right total knee arthroplasty.  The patient tolerated the procedure well without complications.  Postoperatively, he was noted to have intact pedal pulses, intact dorsiflexion and plantar flexion.  The patients Hemovac was removed on postoperative day #2. Hematocrit at that time was 32.  CPM was started.  Coumadin was started for deep venous thrombosis prophylaxis.  Incision was intact at the time of discharge.  He is discharged home in good condition on postoperative day #5. DISCHARGE MEDICATIONS: 1. Preadmission medications. 2. Percocet one or two p.o. q.3-4h. p.r.n. pain. 3. Coumadin.  FOLLOWUP:  He will follow up in one week for suture removal. Dictated by:   [name], M.D. Attending Physician:  Burnard Bunting DD:  10/02/01 TD:  10/03/01 Job: [id number]

## 2011-03-02 NOTE — Op Note (Signed)
NAMEMarland Kitchen  RYNELL, CIOTTI                ACCOUNT NO.:  [id number]   MEDICAL RECORD NO.:  [id number]          PATIENT TYPE:  INP   LOCATION:  5007                         FACILITY:  [facility]   PHYSICIAN:  Burnard Bunting, M.D.    DATE OF BIRTH:  07/30/1951   DATE OF PROCEDURE:  07/02/2005  DATE OF DISCHARGE:                                 OPERATIVE REPORT   PREOPERATIVE DIAGNOSIS:  Right total knee infection.   POSTOPERATIVE DIAGNOSIS:  Right total knee infection.   OPERATION PERFORMED:  Right total knee complete synovectomy, debridement and  liner exchange.   SURGEON:  Burnard Bunting, M.D.   ASSISTANT:  None.   ANESTHESIA:  General endotracheal.   ESTIMATED BLOOD LOSS:  Minimal.   DRAINS:  Hemovac times one.   CULTURES:  Obtained times two.   OPERATIVE FINDINGS:  Serosanguineous fluid within the knee compartment with  minimal wear on the polyethylene insert.   DESCRIPTION OF PROCEDURE:  The patient was brought to the operating room  where general endotracheal anesthesia was introduced.  Preoperative  antibiotics were administered.  Right leg was elevated but not  exsanguinated.  The tourniquet was inflated to 300 mmHg.  Total tourniquet  time was approximately an hour.  A prior anterior approach to the knee was  utilized.  Skin and subcutaneous tissue were sharply divided.  Median  parapatellar arthrotomy was used.  Patella was everted, complete synovectomy  was performed in the anterior and posterior compartments.  Liner was  removed.  Cultures were obtained following a complete synovectomy, 6 L of  pulsatile irrigating solution were placed through the knee.  All inflamed  synovium which was visible was removed including down the posterior  compartment which was removed carefully with a curet and rongeur.  Care was  taken to avoid scratching the metal surface.  At this time a 12 mm liner was  then placed.  The knee was taken through a range of motion and found to be  stable.  At  this time Hemovac drain was placed.  Incision was closed over a  bolster  using a #1 Ethibond suture.  Skin was closed using interrupted inverted 2-0  Vicryl suture.  Tourniquet was released at this time.  Staples were applied.  Bulky dressing was applied.  The patient tolerated the procedure well  without immediate complications.  Foot was perfused following tourniquet  release.           ______________________________  G. Dorene Grebe, M.D.     GSD/MEDQ  D:  07/08/2005  T:  07/09/2005  Job:  [id number]

## 2011-03-02 NOTE — Op Note (Signed)
NAMEWENDALL, Nicholas [name]                ACCOUNT NO.:  [id number]   MEDICAL RECORD NO.:  [id number]          PATIENT TYPE:  INP   LOCATION:  5016                         FACILITY:  [facility]   PHYSICIAN:  Burnard Bunting, M.D.    DATE OF BIRTH:  07-09-1951   DATE OF PROCEDURE:  06/25/2006  DATE OF DISCHARGE:  07/01/2006                               OPERATIVE REPORT   PREOPERATIVE DIAGNOSIS:  Right total knee arthroplasty infection status  post antibiotic spacer placement.   POSTOPERATIVE DIAGNOSIS:  Right total knee infection status post  antibiotic spacer placement.   PROCEDURES:  Right total knee antibiotic spacer removal with  debridement, intraoperative cultures, and revision total knee  replacement using both patellar, femoral, and tibial components.   SURGEON:  Burnard Bunting, M.D.   ASSISTANT:  Wende Neighbors, P.A.   ANESTHESIA:  General endotracheal.   ESTIMATED BLOOD LOSS:  500 mL.   TOURNIQUET TIME:  2 hours at 300 mmHg with a 30-minute let down time,  followed by 15 minutes at 300 mmHg.   SPECIMENS:  Culture specimens x5 were sent with intraoperative frozen  section analysis negative for acute inflammation.   INDICATIONS:  Nicholas [name] is a 60 year old patient with right TKA  infection who presents now for antibiotic spacer removal and possible  revision.   PROCEDURE IN DETAIL:  The patient was brought to the operating room  where general endotracheal anesthesia was induced.  Preoperative  antibiotics were administered.  The right leg was prepped using DuraPrep  solution and draped in a sterile manner.  Preoperative range of motion  was about 0-30 degrees.  The leg was elevated, exsanguinated with the  Esmarch wrap, and tourniquet was inflated.  A parapatellar approach to  the knee was utilized.  A quad snip was required in order to facilitate  exposure of the knee.  The antibiotic spacers were removed.  There was  no evidence of acute inflammation or gross purulence.   Tissue was sent  and cultures were obtained.  The debridement of bone, soft tissue, and  complete synovectomy was then performed.  Significant scar tissue had  formed.  The guide pin was placed at the level of the tibial tubercle to  prevent patellar tendon avulsion.  At this time, the tibia was prepared  with reaming of the tibial shaft followed by re-cutting of the tibia.  Central defect was noted but there was no un-contained peripheral  defects in the bone.  The femur was then prepared with the wedges  required.  Intramedullary reaming was first performed by hand and then  anterior, posterior, and chamfer cuts were then performed.  The patella  was then also prepared.  About a 10-mm thickness was left after  debridement.  Holes were drilled and good patellar buttons then were  achieved.  Trial components were placed.  Pathologic analysis of the  initial tissue specimens shows no acute inflammation.  The decision was  made at that time to proceed with total knee revision.  Trial components  were removed and true components were placed using antibiotic-  impregnated  cement with Tobramycin.  The components were then cemented  into position.  Patient had excellent patellar stability and good range  of motion.   It should be noted that the tourniquet was initially released before  implantation of the components.  Bleeding points encountered were  controlled with electrocautery.  Thirty minutes elapsed while continued  debridement was performed.  Bony surfaces were irrigated.  The  tourniquet was then placed up again for a total of 15 minutes and the  components were cemented into position.  The Hemovac drain was then  placed.  Extensor mechanism was repaired using a #1 Vicryl suture.  Quad  lengthening was performed by about 1.5 cm proximally with continuity  maintained of the extensor mechanism.  The skin was then closed using  interrupted inverted 0 Vicryl suture and skin staples.  The  patient was  placed in bulky knee dressing and knee immobilizer.  He tolerated the  procedure well without immediate complication.      Burnard Bunting, M.D.  Electronically Signed     GSD/MEDQ  D:  07/08/2006  T:  07/09/2006  Job:  [id number]

## 2011-03-02 NOTE — Discharge Summary (Signed)
[name], [name]                ACCOUNT NO.:  [id number]   MEDICAL RECORD NO.:  [id number]          PATIENT TYPE:  INP   LOCATION:  5016                         FACILITY:  [facility]   PHYSICIAN:  Burnard Bunting, M.D.    DATE OF BIRTH:  12-Oct-1951   DATE OF ADMISSION:  06/25/2006  DATE OF DISCHARGE:  07/01/2006                               DISCHARGE SUMMARY   DISCHARGE DIAGNOSIS:  Right infected TKA status post revision and  implantation.   SECONDARY DIAGNOSES:  None.   OPERATIONS, PROCEDURES:  Removal of antibiotic spacers and implantation  of right total knee replacement, revision performed June 25, 2006.   HOSPITAL COURSE:  Nicholas [name] is a 60 year old patient with an infected  right total knee placement.  He is 8 weeks out from the removal of  components and placement of antibiotic spacer.  Presents now for further  management.  Patient was admitted to the orthopedic service June 25, 2006.  At that time, patient underwent removal of antibiotic spacer  and reimplantation of components.  All cultures from that surgery were  negative.  One culture Gram stain did show a rare gram-positive cocci.  Patient was maintained on IV clindamycin throughout his hospitalization.  He was immobilized with physical therapy and started CPM machine for  range of motion.  Hemoglobin was 10.2 on postop day number 1.  He was  transfused 2 units of packed red blood cells for hemoglobin of 7.8 on  postop day number 2.  Incision was intact on postop day number 3.  He  achieved about  60 degrees range of motion with ambulating in the hall  by postop day number 5.  His ultrasound was negative for DVT at that  time.  INR was 1.4 at time of discharge.  He was discharged home in good  condition on Percocet number 10 and Robaxin and doxycycline 100 mg p.o.  b.i.d. for 3 months.  He will follow up with me in 7 days for suture  removal.      Burnard Bunting, M.D.  Electronically Signed     GSD/MEDQ  D:  07/18/2006  T:  07/18/2006  Job:  [id number]
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
## 2011-03-02 NOTE — Procedures (Signed)
[place]. [facility]  Patient:    Nicholas [name], Nicholas [name] Visit Number: [id number] MRN: [id number]          Service Type: SUR Location: RCRM 2550 03 Attending Physician:  Burnard Bunting Dictated by:   Kaylyn Layer Michelle Piper, M.D. Proc. Date: 09/18/01 Admit Date:  09/18/2001                             Procedure Report  PROCEDURE:  Epidural catheter placement for postoperative pain relief.  SURGEON:  Kaylyn Layer. Michelle Piper, M.D.  INDICATIONS:  I was consulted by Dr. Pollyann Kennedy and I was consulted by Dr. Dorene Grebe to place an epidural catheter into the patient for postoperative pain relief.  Dr. Pollyann Kennedy had the opportunity to discuss the procedure with the patient of treatment, risks, and benefits, and the patient agreed to proceed.  DESCRIPTION OF PROCEDURE:  At the end of the procedure, prior to emergence from anesthesia, due to Dr. Zoila Shutter being tied up, I elected to place the epidural catheter.  The patient was turned to the right lateral decubitus position.  His back was prepped with Betadine.  A #17 Tuohy needle was injected sterilely in the L2-3 interspace in the midline using loss of resistance technique.  The catheter was inserted proximally and admitted into the epidural space and the needle removed.  The catheter was firmly affixed to the patients back.  After negative aspiration, _________ 100 mcg of fentanyl and 10 cc of 0.5% Xylocaine was injected.  The patient was then turned supine, extubated, and taken to the recovery room in stable condition.  In the PACU, a fentanyl/Marcaine infusion was begun, and he will be followed on the floor by the anesthesiology service. Dictated by:   Kaylyn Layer Michelle Piper, M.D. Attending Physician:  Burnard Bunting DD:  09/18/01 TD:  09/18/01 Job: [id number]

## 2011-03-02 NOTE — Discharge Summary (Signed)
NAMEMarland Kitchen  COVEY, BALLER NO.:  [id number]   MEDICAL RECORD NO.:  [id number]          PATIENT TYPE:  IPS   LOCATION:  4147                         FACILITY:  [facility]   PHYSICIAN:  Ellwood Dense, M.D.   DATE OF BIRTH:  02/01/1951   DATE OF ADMISSION:  04/11/2006  DATE OF DISCHARGE:  04/16/2006                                 DISCHARGE SUMMARY   DISCHARGE DIAGNOSES:  1.  Right knee infection/Methicillin-sensitive Staphylococcus aureus status      post antibiotic spacer with hardware removal on April 05, 2006.  2.  Intravenous vancomycin x6 weeks.  3.  Pain management.  4.  Coumadin for deep venous thrombosis prophylaxis.   A 60 year old male with history of right total knee replacement in 2002,  subsequent right knee infection with synovectomy, debridement and liner  exchange September 2006 for Staph aureus infection.  He completed  antibiotics and was monitored.  Synovial fluid with positive few Staph knee  infection.  Underwent removal of hardware, insertion of antibiotic spacer  June 22 per Dr. August Saucer.  Touchdown weightbearing right lower extremity with  knee immobilizer.  Coumadin for deep venous thrombosis prophylaxis.  Initially placed on intravenous Ancef for 6 weeks.   PAST MEDICAL HISTORY:  See discharge diagnoses.  No alcohol or tobacco.   ALLERGIES:  PERCOCET.   SOCIAL HISTORY:  Lives alone in a mobile home. Is divorced.   MEDICATIONS PRIOR TO ADMISSION:  None.   REHABILITATION HOSPITAL COURSE:  The patient was admitted to inpatient rehab  services with therapies initiated on a b.i.d. basis consisting of physical  therapy, occupational therapy and rehabilitation nursing.  The following  issues were addressed during the patient's rehabilitation stay.  Pertaining  to Mr. Gabor's right knee infection/MSSA, status post antibiotic spacer with  hardware removal June 22, knee immobilizer remained in place, touchdown  weightbearing.  He would follow up with Dr.  August Saucer.  He was initially on  intravenous Ancef; however, he developed a diffuse rash to the lower  extremity thus with followup per infectious disease he was placed on  vancomycin.  His Ancef was discontinued April 15, 2006.  He will continue his  vancomycin until the first week of August of 2007.  He remained on Coumadin  for deep venous thrombosis prophylaxis.  Latest INR of 1.7.  He had been on  subcutaneous Lovenox until INR greater than 2.  He will complete his  Coumadin therapy May 05, 2006.  A home health nurse had been arranged.  His  blood pressures remained well controlled without the use of antihypertensive  medication.  He was independent in his room.  Home health therapies had been  arranged.   Latest labs showed an INR of 1.7, hemoglobin 10.9, hematocrit 31.9.  Sodium  133, potassium 3.7, BUN 9, creatinine 0.8.   Discharge medications at the time of dictation included Coumadin 4 mg daily  until May 05, 2006 then stop; Vicodin 1 or 2 tablets every 4 hours as  needed for pain; Tylenol as needed; intravenous vancomycin per pharmacy  protocol to be completed May 19, 2006.  Special instructions were home health nurse for IV antibiotic care,  touchdown weightbearing right lower extremity with knee immobilizer, home  health nurse per advanced home care for Coumadin therapy.      Mariam Dollar, P.A.    ______________________________  Ellwood Dense, M.D.    DA/MEDQ  D:  04/16/2006  T:  04/16/2006  Job:  [id number]   cc:   G. Dorene Grebe, M.D.  Fax: [phone]   Rockey Situ. Flavia Shipper., M.D.  Fax: [phone]   [facility]

## 2011-03-02 NOTE — Op Note (Signed)
. [facility]  Patient:    Nicholas [name], Nicholas [name] Visit Number: [id number] MRN: [id number]          Service Type: SUR Location: 5000 5028 01 Attending Physician:  Burnard Bunting Dictated by:   [name], M.D. Proc. Date: 09/18/01 Admit Date:  09/18/2001                             Operative Report  PREOPERATIVE DIAGNOSIS:  Right knee arthritis.  POSTOPERATIVE DIAGNOSIS:  Right knee arthritis.  PROCEDURE:  Right total knee arthroplasty utilizing Osteonics posterior-stabilized cemented femur, tibia, and 12 mm polyethylene insert, size 11 patella.  SURGEON:  [name], M.D.  ASSISTANT:  Reynolds Bowl, M.D.  ANESTHESIA:  General endotracheal plus postop epidural.  ESTIMATED BLOOD LOSS:  300 cc.  DRAINS:  Hemovac x 1.  TOURNIQUET TIME:  2 hours 10 minutes at 300 mmHg, followed by release for 15 minutes, followed by reapplication for cementing for 13 more minutes at 300 mmHg.  DESCRIPTION OF PROCEDURE:  The patient was brought to the OR, where general endotracheal anesthesia was induced.  Preoperative IV antibiotics were administered.  The right leg was prepped with Duraprep solution and draped in a sterile manner.  The leg was elevated and exsanguinated with the Esmarch. The tourniquet was inflated.  The operative field was covered with Betadine-impregnated Ioban sheet.  The skin and subcutaneous tissue were sharply divided.  The median parapatellar approach to the knee was utilized. The lateral patellofemoral ligament was released.  Osteophytes on the medial side were also released.  The knee was severely arthritic and degenerative. The patellar osteophytes were removed to facilitate the patellar eversion. Periosteal sleeve was stripped posteriorly back to the semimembranosus bursa. The patella was everted and the knee was flexed.  A 5 degree valgus cut was then made with the internal alignment rods.  A 10 mm distal cut was  performed. The sizing guide was then placed on the distal end of the cut femur and aligned rotationally with the epicondylar axis, which was about 3-4 degrees. The size 11 cutting block was applied and the anterior, posterior, and chamfer cuts were then made with the collateral ligaments protected.  At this time the tibial cut was made.  The internal alignment rod was utilized with a size 11 base plate.  A 10 mm cut was made off of the least affected lateral side.  The appearance of the tibial cut was consistent with preoperative templating to achieve optimal alignment within the mechanical axis of the tibia.  A cyst was encountered within the tibia from the prior ACL tunnel.  The cyst was removed and prepared for later bone grafting.  At this time trial femur and tibia were placed and rotation was marked.  The patient achieved a full range of motion with no lift-off with good stability to varus-valgus stress at 0, 30, and 90 degrees of flexion.  This was achieved with a size 11 tibia, femur, and 12 mm insert.  At this time the patella was prepared.  The patella itself measured 24 mm.  A 10 mm resection was made with the oscillating saw.  The flatness of the cut was checked with a flat cutting block.  The trial tibial button was placed, and the patella had excellent tracking with no-thumbs technique.  At this time the cyst was filled with bone graft and the tibial tower was placed and prepared  for a cemented keel.  The bone grafting was performed with excess bone removed from the other bony _____.  The cystic cavity was well-contained in the location of the prior tibial tunnel.  At this time components were removed and cut bony surfaces were irrigated.  The tourniquet was released. Soft tissue releases also had to be performed in the back and also to remove posterior tibial osteophytes.  After the tourniquet was released, the middle geniculate artery was noted to be bleeding, and this was  controlled with electrocautery.  The tourniquet was then placed back up for cementing of the components.  The femur, tibia, and patellar components were cemented into position.  A 12 insert was placed, was tapped into position.  All range of motion parameters and stability parameters remained the same.  The patient did have full range of motion with gravity flexion on the table.  The tourniquet was re-released after 13 additional minutes at 300 mmHg.  Bleeding points encountered were controlled using electrocautery.  A Hemovac drain was placed. The excess cement was removed.  The incision was closed using 1-0 Vicryl to reapproximate the parapatellar arthrotomy, skin and subcutaneous tissue were closed using inverted 2-0 Vicryl sutures, followed by skin staples.  The patient tolerated the procedure well and had palpable pedal pulse at the conclusion of the case.  The leg was placed in a bulky dressing and a knee immobilizer.  She tolerated the procedure well without immediate complications. Dictated by:   [name], M.D. Attending Physician:  Burnard Bunting DD:  09/23/01 TD:  09/23/01 Job: [phone] [id number]

## 2011-03-02 NOTE — Op Note (Signed)
[place]. [facility]  Patient:    [name], [name] Visit Number: [id number] MRN: [id number]          Service Type: SUR Location: 5000 1027 01 Attending Physician:  Burnard Bunting Dictated by:   [name], M.D. Proc. Date: 11/04/01 Admit Date:  11/04/2001                             Operative Report  PREOPERATIVE DIAGNOSIS:  Right knee arthrofibrosis, status post total knee arthroplasty.  POSTOPERATIVE DIAGNOSIS:  Right knee arthrofibrosis, status post total knee arthroplasty.  PROCEDURE:  Right knee manipulation under anesthesia with diagnostic and operative arthroscopy and lysis of adhesions.  SURGEON:  [name], M.D.  ANESTHESIA:  General endotracheal.  ESTIMATED BLOOD LOSS:  15 cc.  DRAINS:  Hemovac x 1.  INDICATION:  Nicholas [name] is a 60 year old patient who is now six weeks out from right total knee arthroplasty.  He has been unable to gain flexion beyond 60-70 degrees and presents now for further management.  DESCRIPTION OF PROCEDURE:  Patient brought to the operating room, where general endotracheal anesthesia was induced, preoperative IV antibiotics were administered.  The right leg was prepped with Duraprep solution and draped in a sterile manner.  The topographic anatomy of the right knee was identified, including the medial and lateral margins of the patellar tendon, the inferior margin of the patella, the tibial tubercle, and the joint line both medially and laterally.  The knee was gently manipulated under anesthesia. Preoperative range of motion was from about 0-60 degrees.  Following manipulation, range of motion was about 0-90 degrees.  At this time arthroscopy was performed.  The suprapatellar pouch was first entered through superior lateral portal.  Anterior inferior lateral portal was then created, anterior inferomedial portal was created under direct visualization.  Fibrous tissue from within the  intercondylar notch was removed.  Fibrous tissue between the post and the notch was also removed with the shaver.  The suprapatellar pouch was then re-established and the scar tissue between the proximal aspect of the extensor mechanism and the anterior femur was removed using the large shaver.  No evidence of infection or synovitis was seen. Bleeding points encountered were controlled using electrocautery.  At this time the instruments were removed and an additional 20-25 degrees of flexion was achieved, giving the patient a range of motion from about 5-115 degrees of flexion.  Stability to varus and valgus stress was maintained.  Total tourniquet time was 59 minutes at 300 mmHg.  The Hemovac drain was placed through the superior lateral portal.  Portals were then closed in two layers using 3-0 Vicryl and 3-0 nylon suture.  The patient was placed in a bulky Jones-type dressing.  Postoperative epidural was then placed.  The patient tolerated the procedure well without immediate complications. Dictated by:   [name], M.D. Attending Physician:  Burnard Bunting DD:  11/04/01 TD:  11/06/01 Job: [id number]

## 2011-03-02 NOTE — Discharge Summary (Signed)
NAMEBRYKER, FLETCHALL NO.:  [id number]   MEDICAL RECORD NO.:  [id number]          PATIENT TYPE:  INP   LOCATION:  5032                         FACILITY:  [facility]   PHYSICIAN:  Burnard Bunting, M.D.    DATE OF BIRTH:  12/19/1950   DATE OF ADMISSION:  04/05/2006  DATE OF DISCHARGE:  04/11/2006                                 DISCHARGE SUMMARY   DATE OF ADMISSION:  April 05, 2006   DATE OF DISCHARGE:  April 11, 2006   DISCHARGE DIAGNOSIS:  Right total knee replacement infection.   SECONDARY DIAGNOSIS:  None.   OPERATIONS/PROCEDURES:  Right total knee arthroplasty, pin removal with  antibiotic spacer insertion, April 05, 2006.   HOSPITAL COURSE:  Nicholas [name] is a 60 year old patient who underwent right  total knee arthroplasty about 5 years ago.  He sustained spontaneous  infection 9 months ago, which we have attempted to treat with polyethylene  exchange and antibiotic suppression.  That regimen has failed once he came  off antibiotics, and he presents now with recurrent infection in the right  knee.  The patient was taken to the operating room on April 05, 2006.  At  that time pin removal was performed.  Infectious disease consultation was  obtained.  The patient was started on Coumadin for DVT prophylaxis and CPM  for range of motion, touchdown weightbearing on the right lower extremity.  He had an otherwise unremarkable recovery.  Infectious disease consultation  recommended IV antibiotics, which was arranged before his discharge.  The  patient had an otherwise unremarkable recovery.  He was started on Coumadin  for DVT prophylaxis and was therapeutic at the time of discharge.   DISCHARGE MEDICATIONS:  Include:  Vancomycin IV per pharmacy dosing,  Coumadin for DVT prophylaxis, Percocet for pain.   Will follow up with me in 7 days for suture removal.           ______________________________  G. Dorene Grebe, M.D.     GSD/MEDQ  D:  05/12/2006  T:  05/12/2006   Job:  [id number]

## 2011-03-02 NOTE — Discharge Summary (Signed)
NAMECORDERRO, KOLOSKI                ACCOUNT NO.:  [id number]   MEDICAL RECORD NO.:  [id number]          PATIENT TYPE:  INP   LOCATION:  5007                         FACILITY:  [facility]   PHYSICIAN:  Burnard Bunting, M.D.    DATE OF BIRTH:  1950/11/02   DATE OF ADMISSION:  07/03/2005  DATE OF DISCHARGE:  07/09/2005                                 DISCHARGE SUMMARY   DISCHARGE DIAGNOSIS:  Right total knee infection.   SECONDARY DIAGNOSES:  None.   OPERATION/PROCEDURE:  Right knee synovectomy and liner exchange performed  July 04, 2005.   HOSPITAL COURSE:  Nicholas [name] is a 60 year old patient who is four years  status post right total knee arthroplasty.  He presented to the clinic with  one to two-day history of malaise and knee pain with no __________ injury.  He does have a history of tooth and gum disease and several loose teeth.  Aspiration in the clinic was positive for infection.  He was admitted to the  orthopedic service, started on IV antibiotics and underwent a right knee  synovectomy and liner exchange on July 04, 2005.  He tolerated the  procedure well without immediate complications.  Infectious disease  consultation was obtained.  IV antibiotics were started via PICC line.  Organism was Staph sensitive to Ancef.  Patient had an otherwise  unremarkable recovery.  Recommendation was for Ancef and Rifampin.  Patient  had an otherwise unremarkable recovery and was discharged home on July 09, 2005.  He was afebrile at that time.  He will follow up with me in one  week for suture removal.   DISCHARGE MEDICATIONS:  1.  IV Ancef.  2.  Oral Rifampin.  3.  Percocet for pain.  4.  Robaxin as muscle relaxer.  5.  Coumadin for DVT prophylaxis.   He will follow up with me in seven days for suture removal.  He is  weightbearing as tolerated.           ______________________________  G. Dorene Grebe, M.D.     GSD/MEDQ  D:  10/11/2005  T:  10/12/2005  Job:   [id number]

## 2011-07-17 LAB — CK TOTAL AND CKMB (NOT AT ARMC)
CK, MB: 15.1 ng/mL — ABNORMAL HIGH (ref 0.3–4.0)
Relative Index: 0.4 (ref 0.0–2.5)
Relative Index: 0.9 (ref 0.0–2.5)
Total CK: 3411 U/L — ABNORMAL HIGH (ref 7–232)
Total CK: 725 U/L — ABNORMAL HIGH (ref 7–232)

## 2011-07-17 LAB — URINE CULTURE
Colony Count: >=100000
Culture: NO GROWTH
Culture: NO GROWTH
Special Requests: NEGATIVE

## 2011-07-17 LAB — CBC
HCT: 23.5 % — ABNORMAL LOW (ref 39.0–52.0)
HCT: 25.6 % — ABNORMAL LOW (ref 39.0–52.0)
HCT: 26.9 % — ABNORMAL LOW (ref 39.0–52.0)
HCT: 27.2 % — ABNORMAL LOW (ref 39.0–52.0)
HCT: 29.3 % — ABNORMAL LOW (ref 39.0–52.0)
HCT: 29.8 % — ABNORMAL LOW (ref 39.0–52.0)
HCT: 30.2 % — ABNORMAL LOW (ref 39.0–52.0)
HCT: 40.2 % (ref 39.0–52.0)
Hemoglobin: 14.1 g/dL (ref 13.0–17.0)
Hemoglobin: 10 g/dL — ABNORMAL LOW (ref 13.0–17.0)
Hemoglobin: 10.1 g/dL — ABNORMAL LOW (ref 13.0–17.0)
Hemoglobin: 10.3 g/dL — ABNORMAL LOW (ref 13.0–17.0)
Hemoglobin: 11.7 g/dL — ABNORMAL LOW (ref 13.0–17.0)
Hemoglobin: 8.4 g/dL — ABNORMAL LOW (ref 13.0–17.0)
Hemoglobin: 8.7 g/dL — ABNORMAL LOW (ref 13.0–17.0)
Hemoglobin: 8.8 g/dL — ABNORMAL LOW (ref 13.0–17.0)
Hemoglobin: 8.9 g/dL — ABNORMAL LOW (ref 13.0–17.0)
Hemoglobin: 9.1 g/dL — ABNORMAL LOW (ref 13.0–17.0)
MCHC: 33.4 g/dL (ref 30.0–36.0)
MCHC: 33.4 g/dL (ref 30.0–36.0)
MCHC: 33.6 g/dL (ref 30.0–36.0)
MCHC: 33.8 g/dL (ref 30.0–36.0)
MCHC: 34 g/dL (ref 30.0–36.0)
MCHC: 34 g/dL (ref 30.0–36.0)
MCHC: 34.1 g/dL (ref 30.0–36.0)
MCHC: 34.3 g/dL (ref 30.0–36.0)
MCHC: 34.3 g/dL (ref 30.0–36.0)
MCHC: 34.8 g/dL (ref 30.0–36.0)
MCV: 89.7 fL (ref 78.0–100.0)
MCV: 90.4 fL (ref 78.0–100.0)
MCV: 90.6 fL (ref 78.0–100.0)
MCV: 90.7 fL (ref 78.0–100.0)
MCV: 92.1 fL (ref 78.0–100.0)
MCV: 92.7 fL (ref 78.0–100.0)
Platelets: 175 10*3/uL (ref 150–400)
Platelets: 195 10*3/uL (ref 150–400)
Platelets: 333 10*3/uL (ref 150–400)
Platelets: 388 10*3/uL (ref 150–400)
Platelets: 395 10*3/uL (ref 150–400)
Platelets: 413 10*3/uL — ABNORMAL HIGH (ref 150–400)
Platelets: 415 10*3/uL — ABNORMAL HIGH (ref 150–400)
Platelets: 463 10*3/uL — ABNORMAL HIGH (ref 150–400)
Platelets: 474 10*3/uL — ABNORMAL HIGH (ref 150–400)
Platelets: 494 10*3/uL — ABNORMAL HIGH (ref 150–400)
Platelets: 523 10*3/uL — ABNORMAL HIGH (ref 150–400)
RBC: 4.68 MIL/uL (ref 4.22–5.81)
RBC: 2.67 MIL/uL — ABNORMAL LOW (ref 4.22–5.81)
RBC: 2.77 MIL/uL — ABNORMAL LOW (ref 4.22–5.81)
RBC: 2.82 MIL/uL — ABNORMAL LOW (ref 4.22–5.81)
RBC: 2.87 MIL/uL — ABNORMAL LOW (ref 4.22–5.81)
RBC: 2.96 MIL/uL — ABNORMAL LOW (ref 4.22–5.81)
RBC: 3.23 MIL/uL — ABNORMAL LOW (ref 4.22–5.81)
RBC: 3.85 MIL/uL — ABNORMAL LOW (ref 4.22–5.81)
RDW: 14.8 % (ref 11.5–15.5)
RDW: 15 % (ref 11.5–15.5)
RDW: 15.1 % (ref 11.5–15.5)
RDW: 15.2 % (ref 11.5–15.5)
RDW: 15.2 % (ref 11.5–15.5)
RDW: 15.2 % (ref 11.5–15.5)
RDW: 15.5 % (ref 11.5–15.5)
RDW: 15.6 % — ABNORMAL HIGH (ref 11.5–15.5)
WBC: 22.1 10*3/uL — ABNORMAL HIGH (ref 4.0–10.5)
WBC: 10.7 10*3/uL — ABNORMAL HIGH (ref 4.0–10.5)
WBC: 13.2 10*3/uL — ABNORMAL HIGH (ref 4.0–10.5)
WBC: 14.2 10*3/uL — ABNORMAL HIGH (ref 4.0–10.5)
WBC: 15.3 10*3/uL — ABNORMAL HIGH (ref 4.0–10.5)
WBC: 15.9 10*3/uL — ABNORMAL HIGH (ref 4.0–10.5)
WBC: 16.2 10*3/uL — ABNORMAL HIGH (ref 4.0–10.5)
WBC: 16.4 10*3/uL — ABNORMAL HIGH (ref 4.0–10.5)

## 2011-07-17 LAB — GLUCOSE, CAPILLARY
Glucose-Capillary: 102 mg/dL — ABNORMAL HIGH (ref 70–99)
Glucose-Capillary: 108 mg/dL — ABNORMAL HIGH (ref 70–99)
Glucose-Capillary: 109 mg/dL — ABNORMAL HIGH (ref 70–99)
Glucose-Capillary: 110 mg/dL — ABNORMAL HIGH (ref 70–99)
Glucose-Capillary: 112 mg/dL — ABNORMAL HIGH (ref 70–99)
Glucose-Capillary: 112 mg/dL — ABNORMAL HIGH (ref 70–99)
Glucose-Capillary: 119 mg/dL — ABNORMAL HIGH (ref 70–99)
Glucose-Capillary: 120 mg/dL — ABNORMAL HIGH (ref 70–99)
Glucose-Capillary: 124 mg/dL — ABNORMAL HIGH (ref 70–99)
Glucose-Capillary: 126 mg/dL — ABNORMAL HIGH (ref 70–99)
Glucose-Capillary: 127 mg/dL — ABNORMAL HIGH (ref 70–99)
Glucose-Capillary: 127 mg/dL — ABNORMAL HIGH (ref 70–99)
Glucose-Capillary: 129 mg/dL — ABNORMAL HIGH (ref 70–99)
Glucose-Capillary: 130 mg/dL — ABNORMAL HIGH (ref 70–99)
Glucose-Capillary: 130 mg/dL — ABNORMAL HIGH (ref 70–99)
Glucose-Capillary: 136 mg/dL — ABNORMAL HIGH (ref 70–99)
Glucose-Capillary: 136 mg/dL — ABNORMAL HIGH (ref 70–99)
Glucose-Capillary: 136 mg/dL — ABNORMAL HIGH (ref 70–99)
Glucose-Capillary: 137 mg/dL — ABNORMAL HIGH (ref 70–99)
Glucose-Capillary: 139 mg/dL — ABNORMAL HIGH (ref 70–99)
Glucose-Capillary: 140 mg/dL — ABNORMAL HIGH (ref 70–99)
Glucose-Capillary: 141 mg/dL — ABNORMAL HIGH (ref 70–99)
Glucose-Capillary: 144 mg/dL — ABNORMAL HIGH (ref 70–99)
Glucose-Capillary: 147 mg/dL — ABNORMAL HIGH (ref 70–99)
Glucose-Capillary: 149 mg/dL — ABNORMAL HIGH (ref 70–99)
Glucose-Capillary: 152 mg/dL — ABNORMAL HIGH (ref 70–99)
Glucose-Capillary: 152 mg/dL — ABNORMAL HIGH (ref 70–99)
Glucose-Capillary: 156 mg/dL — ABNORMAL HIGH (ref 70–99)
Glucose-Capillary: 157 mg/dL — ABNORMAL HIGH (ref 70–99)
Glucose-Capillary: 171 mg/dL — ABNORMAL HIGH (ref 70–99)
Glucose-Capillary: 191 mg/dL — ABNORMAL HIGH (ref 70–99)
Glucose-Capillary: 196 mg/dL — ABNORMAL HIGH (ref 70–99)
Glucose-Capillary: 200 mg/dL — ABNORMAL HIGH (ref 70–99)
Glucose-Capillary: 228 mg/dL — ABNORMAL HIGH (ref 70–99)
Glucose-Capillary: 98 mg/dL (ref 70–99)

## 2011-07-17 LAB — COMPREHENSIVE METABOLIC PANEL
ALT: 31 U/L (ref 0–53)
ALT: 31 U/L (ref 0–53)
ALT: 42 U/L (ref 0–53)
ALT: 46 U/L (ref 0–53)
ALT: 55 U/L — ABNORMAL HIGH (ref 0–53)
ALT: 57 U/L — ABNORMAL HIGH (ref 0–53)
ALT: 64 U/L — ABNORMAL HIGH (ref 0–53)
ALT: 75 U/L — ABNORMAL HIGH (ref 0–53)
AST: 107 U/L — ABNORMAL HIGH (ref 0–37)
AST: 135 U/L — ABNORMAL HIGH (ref 0–37)
AST: 192 U/L — ABNORMAL HIGH (ref 0–37)
AST: 213 U/L — ABNORMAL HIGH (ref 0–37)
AST: 72 U/L — ABNORMAL HIGH (ref 0–37)
AST: 85 U/L — ABNORMAL HIGH (ref 0–37)
Albumin: 1.4 g/dL — ABNORMAL LOW (ref 3.5–5.2)
Albumin: 1.5 g/dL — ABNORMAL LOW (ref 3.5–5.2)
Albumin: 1.7 g/dL — ABNORMAL LOW (ref 3.5–5.2)
Albumin: 1.8 g/dL — ABNORMAL LOW (ref 3.5–5.2)
Albumin: 1.8 g/dL — ABNORMAL LOW (ref 3.5–5.2)
Alkaline Phosphatase: 143 U/L — ABNORMAL HIGH (ref 39–117)
Alkaline Phosphatase: 145 U/L — ABNORMAL HIGH (ref 39–117)
Alkaline Phosphatase: 150 U/L — ABNORMAL HIGH (ref 39–117)
Alkaline Phosphatase: 158 U/L — ABNORMAL HIGH (ref 39–117)
Alkaline Phosphatase: 94 U/L (ref 39–117)
BUN: 108 mg/dL — ABNORMAL HIGH (ref 6–23)
BUN: 60 mg/dL — ABNORMAL HIGH (ref 6–23)
BUN: 70 mg/dL — ABNORMAL HIGH (ref 6–23)
BUN: 94 mg/dL — ABNORMAL HIGH (ref 6–23)
CO2: 19 mEq/L (ref 19–32)
CO2: 20 mEq/L (ref 19–32)
CO2: 21 mEq/L (ref 19–32)
CO2: 24 mEq/L (ref 19–32)
CO2: 24 mEq/L (ref 19–32)
Calcium: 5.4 mg/dL — CL (ref 8.4–10.5)
Calcium: 7.1 mg/dL — ABNORMAL LOW (ref 8.4–10.5)
Calcium: 7.1 mg/dL — ABNORMAL LOW (ref 8.4–10.5)
Calcium: 7.6 mg/dL — ABNORMAL LOW (ref 8.4–10.5)
Calcium: 8 mg/dL — ABNORMAL LOW (ref 8.4–10.5)
Calcium: 8 mg/dL — ABNORMAL LOW (ref 8.4–10.5)
Chloride: 115 mEq/L — ABNORMAL HIGH (ref 96–112)
Chloride: 118 mEq/L — ABNORMAL HIGH (ref 96–112)
Chloride: 121 mEq/L — ABNORMAL HIGH (ref 96–112)
Chloride: 123 mEq/L — ABNORMAL HIGH (ref 96–112)
Chloride: 124 mEq/L — ABNORMAL HIGH (ref 96–112)
Creatinine, Ser: 3.11 mg/dL — ABNORMAL HIGH (ref 0.4–1.5)
Creatinine, Ser: 3.43 mg/dL — ABNORMAL HIGH (ref 0.4–1.5)
Creatinine, Ser: 4.11 mg/dL — ABNORMAL HIGH (ref 0.4–1.5)
Creatinine, Ser: 4.35 mg/dL — ABNORMAL HIGH (ref 0.4–1.5)
GFR calc Af Amer: 17 mL/min — ABNORMAL LOW (ref >60)
GFR calc Af Amer: 20 mL/min — ABNORMAL LOW (ref >60)
GFR calc Af Amer: 22 mL/min — ABNORMAL LOW (ref >60)
GFR calc Af Amer: 25 mL/min — ABNORMAL LOW (ref >60)
GFR calc Af Amer: 27 mL/min — ABNORMAL LOW (ref >60)
GFR calc Af Amer: 31 mL/min — ABNORMAL LOW (ref >60)
GFR calc non Af Amer: 14 mL/min — ABNORMAL LOW (ref >60)
GFR calc non Af Amer: 14 mL/min — ABNORMAL LOW (ref >60)
GFR calc non Af Amer: 19 mL/min — ABNORMAL LOW (ref >60)
GFR calc non Af Amer: 20 mL/min — ABNORMAL LOW (ref >60)
GFR calc non Af Amer: 21 mL/min — ABNORMAL LOW (ref >60)
GFR calc non Af Amer: 21 mL/min — ABNORMAL LOW (ref >60)
Glucose, Bld: 121 mg/dL — ABNORMAL HIGH (ref 70–99)
Glucose, Bld: 130 mg/dL — ABNORMAL HIGH (ref 70–99)
Glucose, Bld: 143 mg/dL — ABNORMAL HIGH (ref 70–99)
Glucose, Bld: 178 mg/dL — ABNORMAL HIGH (ref 70–99)
Glucose, Bld: 182 mg/dL — ABNORMAL HIGH (ref 70–99)
Glucose, Bld: 212 mg/dL — ABNORMAL HIGH (ref 70–99)
Potassium: 3.2 mEq/L — ABNORMAL LOW (ref 3.5–5.1)
Potassium: 3.6 mEq/L (ref 3.5–5.1)
Potassium: 3.9 mEq/L (ref 3.5–5.1)
Potassium: 4 mEq/L (ref 3.5–5.1)
Potassium: 4.1 mEq/L (ref 3.5–5.1)
Sodium: 142 mEq/L (ref 135–145)
Sodium: 144 mEq/L (ref 135–145)
Sodium: 146 mEq/L — ABNORMAL HIGH (ref 135–145)
Sodium: 151 mEq/L — ABNORMAL HIGH (ref 135–145)
Sodium: 155 mEq/L — ABNORMAL HIGH (ref 135–145)
Sodium: 158 mEq/L — ABNORMAL HIGH (ref 135–145)
Total Bilirubin: 1.2 mg/dL (ref 0.3–1.2)
Total Bilirubin: 1.5 mg/dL — ABNORMAL HIGH (ref 0.3–1.2)
Total Bilirubin: 2.8 mg/dL — ABNORMAL HIGH (ref 0.3–1.2)
Total Bilirubin: 3.1 mg/dL — ABNORMAL HIGH (ref 0.3–1.2)
Total Bilirubin: 3.6 mg/dL — ABNORMAL HIGH (ref 0.3–1.2)
Total Protein: 5.6 g/dL — ABNORMAL LOW (ref 6.0–8.3)
Total Protein: 5.7 g/dL — ABNORMAL LOW (ref 6.0–8.3)
Total Protein: 5.9 g/dL — ABNORMAL LOW (ref 6.0–8.3)
Total Protein: 5.9 g/dL — ABNORMAL LOW (ref 6.0–8.3)
Total Protein: 6.9 g/dL (ref 6.0–8.3)

## 2011-07-17 LAB — POCT I-STAT 3, ART BLOOD GAS (G3+)
Acid-base deficit: 2 mmol/L (ref 0.0–2.0)
Bicarbonate: 21.6 mEq/L (ref 20.0–24.0)
Bicarbonate: 23.9 mEq/L (ref 20.0–24.0)
O2 Saturation: 100 %
Patient temperature: 101.2
Patient temperature: 98.4
TCO2: 23 mmol/L (ref 0–100)
TCO2: 25 mmol/L (ref 0–100)
pH, Arterial: 7.425 (ref 7.350–7.450)

## 2011-07-17 LAB — DIFFERENTIAL
Basophils Absolute: 0 10*3/uL (ref 0.0–0.1)
Basophils Absolute: 0 10*3/uL (ref 0.0–0.1)
Basophils Relative: 0 % (ref 0–1)
Basophils Relative: 1 % (ref 0–1)
Blasts: 0 %
Eosinophils Absolute: 0 10*3/uL (ref 0.0–0.7)
Eosinophils Absolute: 0 10*3/uL (ref 0.0–0.7)
Eosinophils Absolute: 0.1 10*3/uL (ref 0.0–0.7)
Eosinophils Relative: 0 % (ref 0–5)
Eosinophils Relative: 1 % (ref 0–5)
Lymphocytes Relative: 3 % — ABNORMAL LOW (ref 12–46)
Lymphocytes Relative: 4 % — ABNORMAL LOW (ref 12–46)
Lymphocytes Relative: 8 % — ABNORMAL LOW (ref 12–46)
Lymphs Abs: 0.8 10*3/uL (ref 0.7–4.0)
Lymphs Abs: 1 10*3/uL (ref 0.7–4.0)
Monocytes Absolute: 0.7 10*3/uL (ref 0.1–1.0)
Monocytes Absolute: 0.7 10*3/uL (ref 0.1–1.0)
Monocytes Absolute: 1.9 10*3/uL — ABNORMAL HIGH (ref 0.1–1.0)
Monocytes Relative: 13 % — ABNORMAL HIGH (ref 3–12)
Neutro Abs: 20 10*3/uL — ABNORMAL HIGH (ref 1.7–7.7)
Neutro Abs: 10.6 10*3/uL — ABNORMAL HIGH (ref 1.7–7.7)
Neutrophils Relative %: 90 % — ABNORMAL HIGH (ref 43–77)
Neutrophils Relative %: 88 % — ABNORMAL HIGH (ref 43–77)
Promyelocytes Absolute: 0 %
nRBC: 0 /100 WBC

## 2011-07-17 LAB — CULTURE, BLOOD (ROUTINE X 2)
Culture: NO GROWTH
Culture: NO GROWTH
Culture: NO GROWTH

## 2011-07-17 LAB — BODY FLUID CULTURE: Culture: NO GROWTH

## 2011-07-17 LAB — CROSSMATCH
ABO/RH(D): O POS
ABO/RH(D): O POS
Antibody Screen: NEGATIVE

## 2011-07-17 LAB — HEPATIC FUNCTION PANEL
ALT: 35 U/L (ref 0–53)
ALT: 66 U/L — ABNORMAL HIGH (ref 0–53)
AST: 55 U/L — ABNORMAL HIGH (ref 0–37)
Alkaline Phosphatase: 210 U/L — ABNORMAL HIGH (ref 39–117)
Bilirubin, Direct: 2.2 mg/dL — ABNORMAL HIGH (ref 0.0–0.3)
Indirect Bilirubin: 1.4 mg/dL — ABNORMAL HIGH (ref 0.3–0.9)
Total Protein: 6.8 g/dL (ref 6.0–8.3)

## 2011-07-17 LAB — RENAL FUNCTION PANEL
Albumin: 1.4 g/dL — ABNORMAL LOW (ref 3.5–5.2)
BUN: 79 mg/dL — ABNORMAL HIGH (ref 6–23)
CO2: 19 mEq/L (ref 19–32)
CO2: 19 mEq/L (ref 19–32)
CO2: 21 mEq/L (ref 19–32)
Calcium: 7.7 mg/dL — ABNORMAL LOW (ref 8.4–10.5)
Chloride: 130 mEq/L — ABNORMAL HIGH (ref 96–112)
Creatinine, Ser: 2.81 mg/dL — ABNORMAL HIGH (ref 0.4–1.5)
Creatinine, Ser: 3.72 mg/dL — ABNORMAL HIGH (ref 0.4–1.5)
GFR calc Af Amer: 20 mL/min — ABNORMAL LOW (ref >60)
GFR calc Af Amer: 28 mL/min — ABNORMAL LOW (ref >60)
GFR calc non Af Amer: 17 mL/min — ABNORMAL LOW (ref >60)
GFR calc non Af Amer: 23 mL/min — ABNORMAL LOW (ref >60)
Glucose, Bld: 150 mg/dL — ABNORMAL HIGH (ref 70–99)
Glucose, Bld: 164 mg/dL — ABNORMAL HIGH (ref 70–99)
Phosphorus: 3.8 mg/dL (ref 2.3–4.6)
Potassium: 4 mEq/L (ref 3.5–5.1)
Sodium: 149 mEq/L — ABNORMAL HIGH (ref 135–145)
Sodium: 150 mEq/L — ABNORMAL HIGH (ref 135–145)

## 2011-07-17 LAB — URINALYSIS, ROUTINE W REFLEX MICROSCOPIC
Ketones, ur: NEGATIVE mg/dL
Ketones, ur: NEGATIVE mg/dL
Leukocytes, UA: NEGATIVE
Nitrite: POSITIVE — AB
Nitrite: NEGATIVE
Protein, ur: 100 mg/dL — AB
Protein, ur: 30 mg/dL — AB
Specific Gravity, Urine: 1.02 (ref 1.005–1.030)
Urobilinogen, UA: 1 mg/dL (ref 0.0–1.0)
Urobilinogen, UA: 4 mg/dL — ABNORMAL HIGH (ref 0.0–1.0)
Urobilinogen, UA: >8 mg/dL — ABNORMAL HIGH (ref 0.0–1.0)
pH: 5.5 (ref 5.0–8.0)

## 2011-07-17 LAB — ANAEROBIC CULTURE

## 2011-07-17 LAB — MAGNESIUM
Magnesium: 2.7 mg/dL — ABNORMAL HIGH (ref 1.5–2.5)
Magnesium: 3.2 mg/dL — ABNORMAL HIGH (ref 1.5–2.5)

## 2011-07-17 LAB — BASIC METABOLIC PANEL
BUN: 50 mg/dL — ABNORMAL HIGH (ref 6–23)
CO2: 19 mEq/L (ref 19–32)
Calcium: 7.3 mg/dL — ABNORMAL LOW (ref 8.4–10.5)
Chloride: 114 mEq/L — ABNORMAL HIGH (ref 96–112)
Chloride: 116 mEq/L — ABNORMAL HIGH (ref 96–112)
Creatinine, Ser: 1.94 mg/dL — ABNORMAL HIGH (ref 0.4–1.5)
Creatinine, Ser: 2.29 mg/dL — ABNORMAL HIGH (ref 0.4–1.5)
Creatinine, Ser: 2.86 mg/dL — ABNORMAL HIGH (ref 0.4–1.5)
GFR calc Af Amer: 24 mL/min — ABNORMAL LOW (ref >60)
GFR calc Af Amer: 28 mL/min — ABNORMAL LOW (ref >60)
GFR calc Af Amer: 36 mL/min — ABNORMAL LOW (ref >60)
GFR calc Af Amer: 43 mL/min — ABNORMAL LOW (ref >60)
GFR calc non Af Amer: 20 mL/min — ABNORMAL LOW (ref >60)
GFR calc non Af Amer: 30 mL/min — ABNORMAL LOW (ref >60)
Potassium: 3.3 mEq/L — ABNORMAL LOW (ref 3.5–5.1)
Potassium: 3.7 mEq/L (ref 3.5–5.1)
Sodium: 135 mEq/L (ref 135–145)
Sodium: 144 mEq/L (ref 135–145)

## 2011-07-17 LAB — WOUND CULTURE: Gram Stain: NONE SEEN

## 2011-07-17 LAB — PHOSPHORUS
Phosphorus: 3.8 mg/dL (ref 2.3–4.6)
Phosphorus: 4.2 mg/dL (ref 2.3–4.6)
Phosphorus: 4.7 mg/dL — ABNORMAL HIGH (ref 2.3–4.6)
Phosphorus: 5.1 mg/dL — ABNORMAL HIGH (ref 2.3–4.6)
Phosphorus: 6.1 mg/dL — ABNORMAL HIGH (ref 2.3–4.6)
Phosphorus: 6.2 mg/dL — ABNORMAL HIGH (ref 2.3–4.6)

## 2011-07-17 LAB — SYNOVIAL CELL COUNT + DIFF, W/ CRYSTALS
Crystals, Fluid: NONE SEEN
Lymphocytes-Synovial Fld: 6 % (ref 0–20)
Monocyte-Macrophage-Synovial Fluid: 6 % — ABNORMAL LOW (ref 50–90)
Monocyte-Macrophage-Synovial Fluid: 86 % (ref 50–90)
Neutrophil, Synovial: 8 % (ref 0–25)
WBC, Synovial: 8495 /mm3 — ABNORMAL HIGH (ref 0–200)

## 2011-07-17 LAB — BLOOD GAS, ARTERIAL
Acid-Base Excess: 0.5 mmol/L (ref 0.0–2.0)
Bicarbonate: 17.6 mEq/L — ABNORMAL LOW (ref 20.0–24.0)
Bicarbonate: 19.8 mEq/L — ABNORMAL LOW (ref 20.0–24.0)
Bicarbonate: 23.2 mEq/L (ref 20.0–24.0)
Drawn by: 276051
FIO2: 0.21 %
FIO2: 0.32 %
O2 Content: 2 L/min
O2 Saturation: 94 %
O2 Saturation: 94.5 %
O2 Saturation: 96.8 %
Patient temperature: 100.6
Patient temperature: 98.6
Patient temperature: 98.6
Patient temperature: 98.6
TCO2: 20.7 mmol/L (ref 0–100)
pH, Arterial: 7.498 — ABNORMAL HIGH (ref 7.350–7.450)
pO2, Arterial: 88.6 mmHg (ref 80.0–100.0)

## 2011-07-17 LAB — CULTURE, ROUTINE-ABSCESS

## 2011-07-17 LAB — POCT I-STAT, CHEM 8
Calcium, Ion: 0.93 mmol/L — ABNORMAL LOW (ref 1.12–1.32)
Chloride: 107 mEq/L (ref 96–112)
Glucose, Bld: 163 mg/dL — ABNORMAL HIGH (ref 70–99)
HCT: 44 % (ref 39.0–52.0)

## 2011-07-17 LAB — APTT: aPTT: 28 seconds (ref 24–37)

## 2011-07-17 LAB — CREATININE, URINE, RANDOM: Creatinine, Urine: 79.2 mg/dL

## 2011-07-17 LAB — PROTIME-INR
Prothrombin Time: 16.3 seconds — ABNORMAL HIGH (ref 11.6–15.2)
Prothrombin Time: 18.6 seconds — ABNORMAL HIGH (ref 11.6–15.2)

## 2011-07-17 LAB — VITAMIN B12: Vitamin B-12: 957 pg/mL — ABNORMAL HIGH (ref 211–911)

## 2011-07-17 LAB — URINE MICROSCOPIC-ADD ON

## 2011-07-17 LAB — HEPATITIS PANEL, ACUTE
Hep B C IgM: NEGATIVE
Hepatitis B Surface Ag: NEGATIVE

## 2011-07-17 LAB — CARDIAC PANEL(CRET KIN+CKTOT+MB+TROPI): Troponin I: 0.07 ng/mL — ABNORMAL HIGH (ref 0.00–0.06)

## 2011-07-17 LAB — IRON AND TIBC: Iron: 12 ug/dL — ABNORMAL LOW (ref 42–135)

## 2011-07-17 LAB — TROPONIN I: Troponin I: 0.17 ng/mL — ABNORMAL HIGH (ref 0.00–0.06)

## 2011-07-19 LAB — BASIC METABOLIC PANEL
BUN: 13 mg/dL (ref 6–23)
BUN: 16 mg/dL (ref 6–23)
BUN: 25 mg/dL — ABNORMAL HIGH (ref 6–23)
BUN: 43 mg/dL — ABNORMAL HIGH (ref 6–23)
BUN: 53 mg/dL — ABNORMAL HIGH (ref 6–23)
BUN: 54 mg/dL — ABNORMAL HIGH (ref 6–23)
BUN: 54 mg/dL — ABNORMAL HIGH (ref 6–23)
CO2: 19 mEq/L (ref 19–32)
CO2: 24 mEq/L (ref 19–32)
CO2: 25 mEq/L (ref 19–32)
Calcium: 7.3 mg/dL — ABNORMAL LOW (ref 8.4–10.5)
Calcium: 7.5 mg/dL — ABNORMAL LOW (ref 8.4–10.5)
Calcium: 7.7 mg/dL — ABNORMAL LOW (ref 8.4–10.5)
Calcium: 8 mg/dL — ABNORMAL LOW (ref 8.4–10.5)
Chloride: 105 mEq/L (ref 96–112)
Chloride: 108 mEq/L (ref 96–112)
Chloride: 110 mEq/L (ref 96–112)
Chloride: 114 mEq/L — ABNORMAL HIGH (ref 96–112)
Chloride: 117 mEq/L — ABNORMAL HIGH (ref 96–112)
Chloride: 118 mEq/L — ABNORMAL HIGH (ref 96–112)
Creatinine, Ser: 1.4 mg/dL (ref 0.4–1.5)
Creatinine, Ser: 1.83 mg/dL — ABNORMAL HIGH (ref 0.4–1.5)
Creatinine, Ser: 2.19 mg/dL — ABNORMAL HIGH (ref 0.4–1.5)
Creatinine, Ser: 2.25 mg/dL — ABNORMAL HIGH (ref 0.4–1.5)
GFR calc Af Amer: 48 mL/min — ABNORMAL LOW (ref >60)
GFR calc Af Amer: 58 mL/min — ABNORMAL LOW (ref >60)
GFR calc non Af Amer: 29 mL/min — ABNORMAL LOW (ref >60)
GFR calc non Af Amer: 30 mL/min — ABNORMAL LOW (ref >60)
GFR calc non Af Amer: 31 mL/min — ABNORMAL LOW (ref >60)
GFR calc non Af Amer: 40 mL/min — ABNORMAL LOW (ref >60)
GFR calc non Af Amer: 50 mL/min — ABNORMAL LOW (ref >60)
GFR calc non Af Amer: 52 mL/min — ABNORMAL LOW (ref >60)
Glucose, Bld: 102 mg/dL — ABNORMAL HIGH (ref 70–99)
Glucose, Bld: 106 mg/dL — ABNORMAL HIGH (ref 70–99)
Glucose, Bld: 126 mg/dL — ABNORMAL HIGH (ref 70–99)
Glucose, Bld: 142 mg/dL — ABNORMAL HIGH (ref 70–99)
Glucose, Bld: 57 mg/dL — ABNORMAL LOW (ref 70–99)
Glucose, Bld: 78 mg/dL (ref 70–99)
Potassium: 2.9 mEq/L — ABNORMAL LOW (ref 3.5–5.1)
Potassium: 3.3 mEq/L — ABNORMAL LOW (ref 3.5–5.1)
Potassium: 3.9 mEq/L (ref 3.5–5.1)
Potassium: 4.2 mEq/L (ref 3.5–5.1)
Sodium: 137 mEq/L (ref 135–145)
Sodium: 138 mEq/L (ref 135–145)
Sodium: 144 mEq/L (ref 135–145)
Sodium: 145 mEq/L (ref 135–145)

## 2011-07-19 LAB — BLOOD GAS, ARTERIAL
Bicarbonate: 16.5 mEq/L — ABNORMAL LOW (ref 20.0–24.0)
Bicarbonate: 19.1 mEq/L — ABNORMAL LOW (ref 20.0–24.0)
O2 Saturation: 95.5 %
O2 Saturation: 97.4 %
Patient temperature: 98.6
Patient temperature: 98.9
TCO2: 17.2 mmol/L (ref 0–100)
TCO2: 19.9 mmol/L (ref 0–100)
pO2, Arterial: 72.1 mmHg — ABNORMAL LOW (ref 80.0–100.0)
pO2, Arterial: 87.2 mmHg (ref 80.0–100.0)

## 2011-07-19 LAB — GLUCOSE, CAPILLARY
Glucose-Capillary: 100 mg/dL — ABNORMAL HIGH (ref 70–99)
Glucose-Capillary: 100 mg/dL — ABNORMAL HIGH (ref 70–99)
Glucose-Capillary: 102 mg/dL — ABNORMAL HIGH (ref 70–99)
Glucose-Capillary: 103 mg/dL — ABNORMAL HIGH (ref 70–99)
Glucose-Capillary: 105 mg/dL — ABNORMAL HIGH (ref 70–99)
Glucose-Capillary: 106 mg/dL — ABNORMAL HIGH (ref 70–99)
Glucose-Capillary: 106 mg/dL — ABNORMAL HIGH (ref 70–99)
Glucose-Capillary: 107 mg/dL — ABNORMAL HIGH (ref 70–99)
Glucose-Capillary: 109 mg/dL — ABNORMAL HIGH (ref 70–99)
Glucose-Capillary: 111 mg/dL — ABNORMAL HIGH (ref 70–99)
Glucose-Capillary: 114 mg/dL — ABNORMAL HIGH (ref 70–99)
Glucose-Capillary: 114 mg/dL — ABNORMAL HIGH (ref 70–99)
Glucose-Capillary: 115 mg/dL — ABNORMAL HIGH (ref 70–99)
Glucose-Capillary: 116 mg/dL — ABNORMAL HIGH (ref 70–99)
Glucose-Capillary: 117 mg/dL — ABNORMAL HIGH (ref 70–99)
Glucose-Capillary: 122 mg/dL — ABNORMAL HIGH (ref 70–99)
Glucose-Capillary: 122 mg/dL — ABNORMAL HIGH (ref 70–99)
Glucose-Capillary: 123 mg/dL — ABNORMAL HIGH (ref 70–99)
Glucose-Capillary: 123 mg/dL — ABNORMAL HIGH (ref 70–99)
Glucose-Capillary: 123 mg/dL — ABNORMAL HIGH (ref 70–99)
Glucose-Capillary: 125 mg/dL — ABNORMAL HIGH (ref 70–99)
Glucose-Capillary: 127 mg/dL — ABNORMAL HIGH (ref 70–99)
Glucose-Capillary: 129 mg/dL — ABNORMAL HIGH (ref 70–99)
Glucose-Capillary: 129 mg/dL — ABNORMAL HIGH (ref 70–99)
Glucose-Capillary: 131 mg/dL — ABNORMAL HIGH (ref 70–99)
Glucose-Capillary: 132 mg/dL — ABNORMAL HIGH (ref 70–99)
Glucose-Capillary: 133 mg/dL — ABNORMAL HIGH (ref 70–99)
Glucose-Capillary: 136 mg/dL — ABNORMAL HIGH (ref 70–99)
Glucose-Capillary: 138 mg/dL — ABNORMAL HIGH (ref 70–99)
Glucose-Capillary: 138 mg/dL — ABNORMAL HIGH (ref 70–99)
Glucose-Capillary: 139 mg/dL — ABNORMAL HIGH (ref 70–99)
Glucose-Capillary: 141 mg/dL — ABNORMAL HIGH (ref 70–99)
Glucose-Capillary: 141 mg/dL — ABNORMAL HIGH (ref 70–99)
Glucose-Capillary: 145 mg/dL — ABNORMAL HIGH (ref 70–99)
Glucose-Capillary: 146 mg/dL — ABNORMAL HIGH (ref 70–99)
Glucose-Capillary: 148 mg/dL — ABNORMAL HIGH (ref 70–99)
Glucose-Capillary: 149 mg/dL — ABNORMAL HIGH (ref 70–99)
Glucose-Capillary: 150 mg/dL — ABNORMAL HIGH (ref 70–99)
Glucose-Capillary: 155 mg/dL — ABNORMAL HIGH (ref 70–99)
Glucose-Capillary: 245 mg/dL — ABNORMAL HIGH (ref 70–99)
Glucose-Capillary: 70 mg/dL (ref 70–99)
Glucose-Capillary: 79 mg/dL (ref 70–99)
Glucose-Capillary: 80 mg/dL (ref 70–99)
Glucose-Capillary: 80 mg/dL (ref 70–99)
Glucose-Capillary: 86 mg/dL (ref 70–99)
Glucose-Capillary: 87 mg/dL (ref 70–99)
Glucose-Capillary: 92 mg/dL (ref 70–99)

## 2011-07-19 LAB — CARDIAC PANEL(CRET KIN+CKTOT+MB+TROPI)
CK, MB: 2.4 ng/mL (ref 0.3–4.0)
CK, MB: 2.9 ng/mL (ref 0.3–4.0)
CK, MB: 3.2 ng/mL (ref 0.3–4.0)
Relative Index: INVALID (ref 0.0–2.5)
Relative Index: INVALID (ref 0.0–2.5)
Total CK: 57 U/L (ref 7–232)
Total CK: 64 U/L (ref 7–232)
Total CK: 70 U/L (ref 7–232)
Troponin I: 0.01 ng/mL (ref 0.00–0.06)
Troponin I: 0.01 ng/mL (ref 0.00–0.06)

## 2011-07-19 LAB — CBC
HCT: 24.8 % — ABNORMAL LOW (ref 39.0–52.0)
HCT: 26.3 % — ABNORMAL LOW (ref 39.0–52.0)
HCT: 27.5 % — ABNORMAL LOW (ref 39.0–52.0)
HCT: 29.8 % — ABNORMAL LOW (ref 39.0–52.0)
HCT: 30.3 % — ABNORMAL LOW (ref 39.0–52.0)
Hemoglobin: 10.4 g/dL — ABNORMAL LOW (ref 13.0–17.0)
Hemoglobin: 10.4 g/dL — ABNORMAL LOW (ref 13.0–17.0)
Hemoglobin: 8.4 g/dL — ABNORMAL LOW (ref 13.0–17.0)
Hemoglobin: 8.4 g/dL — ABNORMAL LOW (ref 13.0–17.0)
Hemoglobin: 8.8 g/dL — ABNORMAL LOW (ref 13.0–17.0)
Hemoglobin: 9.6 g/dL — ABNORMAL LOW (ref 13.0–17.0)
Hemoglobin: 9.6 g/dL — ABNORMAL LOW (ref 13.0–17.0)
MCHC: 33.3 g/dL (ref 30.0–36.0)
MCHC: 33.3 g/dL (ref 30.0–36.0)
MCHC: 33.5 g/dL (ref 30.0–36.0)
MCHC: 34.5 g/dL (ref 30.0–36.0)
MCHC: 34.5 g/dL (ref 30.0–36.0)
MCV: 90.2 fL (ref 78.0–100.0)
MCV: 90.9 fL (ref 78.0–100.0)
MCV: 90.9 fL (ref 78.0–100.0)
MCV: 91.4 fL (ref 78.0–100.0)
MCV: 91.6 fL (ref 78.0–100.0)
MCV: 92.2 fL (ref 78.0–100.0)
MCV: 92.2 fL (ref 78.0–100.0)
Platelets: 298 10*3/uL (ref 150–400)
Platelets: 313 10*3/uL (ref 150–400)
Platelets: 315 10*3/uL (ref 150–400)
Platelets: 334 10*3/uL (ref 150–400)
Platelets: 339 10*3/uL (ref 150–400)
Platelets: 352 10*3/uL (ref 150–400)
RBC: 2.7 MIL/uL — ABNORMAL LOW (ref 4.22–5.81)
RBC: 3.05 MIL/uL — ABNORMAL LOW (ref 4.22–5.81)
RBC: 3.11 MIL/uL — ABNORMAL LOW (ref 4.22–5.81)
RBC: 3.12 MIL/uL — ABNORMAL LOW (ref 4.22–5.81)
RBC: 3.3 MIL/uL — ABNORMAL LOW (ref 4.22–5.81)
RBC: 3.3 MIL/uL — ABNORMAL LOW (ref 4.22–5.81)
RBC: 3.35 MIL/uL — ABNORMAL LOW (ref 4.22–5.81)
RDW: 14.1 % (ref 11.5–15.5)
RDW: 14.4 % (ref 11.5–15.5)
RDW: 14.5 % (ref 11.5–15.5)
RDW: 14.6 % (ref 11.5–15.5)
RDW: 15.2 % (ref 11.5–15.5)
WBC: 12.6 10*3/uL — ABNORMAL HIGH (ref 4.0–10.5)
WBC: 12.8 10*3/uL — ABNORMAL HIGH (ref 4.0–10.5)
WBC: 13.4 10*3/uL — ABNORMAL HIGH (ref 4.0–10.5)
WBC: 13.7 10*3/uL — ABNORMAL HIGH (ref 4.0–10.5)
WBC: 15.4 10*3/uL — ABNORMAL HIGH (ref 4.0–10.5)
WBC: 9.3 10*3/uL (ref 4.0–10.5)
WBC: 9.4 10*3/uL (ref 4.0–10.5)

## 2011-07-19 LAB — COMPREHENSIVE METABOLIC PANEL
ALT: 15 U/L (ref 0–53)
ALT: 17 U/L (ref 0–53)
AST: 56 U/L — ABNORMAL HIGH (ref 0–37)
AST: 76 U/L — ABNORMAL HIGH (ref 0–37)
Albumin: 1.5 g/dL — ABNORMAL LOW (ref 3.5–5.2)
Albumin: 1.6 g/dL — ABNORMAL LOW (ref 3.5–5.2)
Alkaline Phosphatase: 148 U/L — ABNORMAL HIGH (ref 39–117)
Alkaline Phosphatase: 88 U/L (ref 39–117)
Alkaline Phosphatase: 98 U/L (ref 39–117)
BUN: 47 mg/dL — ABNORMAL HIGH (ref 6–23)
BUN: 50 mg/dL — ABNORMAL HIGH (ref 6–23)
CO2: 17 mEq/L — ABNORMAL LOW (ref 19–32)
CO2: 21 mEq/L (ref 19–32)
CO2: 25 mEq/L (ref 19–32)
Calcium: 7.5 mg/dL — ABNORMAL LOW (ref 8.4–10.5)
Calcium: 7.9 mg/dL — ABNORMAL LOW (ref 8.4–10.5)
Chloride: 112 mEq/L (ref 96–112)
Chloride: 114 mEq/L — ABNORMAL HIGH (ref 96–112)
Chloride: 117 mEq/L — ABNORMAL HIGH (ref 96–112)
Chloride: 120 mEq/L — ABNORMAL HIGH (ref 96–112)
Chloride: 121 mEq/L — ABNORMAL HIGH (ref 96–112)
Creatinine, Ser: 1.74 mg/dL — ABNORMAL HIGH (ref 0.4–1.5)
Creatinine, Ser: 1.87 mg/dL — ABNORMAL HIGH (ref 0.4–1.5)
GFR calc Af Amer: 45 mL/min — ABNORMAL LOW (ref >60)
GFR calc non Af Amer: 41 mL/min — ABNORMAL LOW (ref >60)
Glucose, Bld: 112 mg/dL — ABNORMAL HIGH (ref 70–99)
Glucose, Bld: 115 mg/dL — ABNORMAL HIGH (ref 70–99)
Potassium: 3.3 mEq/L — ABNORMAL LOW (ref 3.5–5.1)
Potassium: 4.4 mEq/L (ref 3.5–5.1)
Sodium: 139 mEq/L (ref 135–145)
Sodium: 153 mEq/L — ABNORMAL HIGH (ref 135–145)
Total Bilirubin: 0.6 mg/dL (ref 0.3–1.2)
Total Bilirubin: 0.7 mg/dL (ref 0.3–1.2)
Total Bilirubin: 1.1 mg/dL (ref 0.3–1.2)
Total Protein: 7.1 g/dL (ref 6.0–8.3)

## 2011-07-19 LAB — FECAL LACTOFERRIN, QUANT

## 2011-07-19 LAB — RENAL FUNCTION PANEL
Albumin: 1.6 g/dL — ABNORMAL LOW (ref 3.5–5.2)
BUN: 45 mg/dL — ABNORMAL HIGH (ref 6–23)
CO2: 22 mEq/L (ref 19–32)
Chloride: 123 mEq/L — ABNORMAL HIGH (ref 96–112)
Creatinine, Ser: 1.63 mg/dL — ABNORMAL HIGH (ref 0.4–1.5)
Creatinine, Ser: 2.32 mg/dL — ABNORMAL HIGH (ref 0.4–1.5)
GFR calc Af Amer: 35 mL/min — ABNORMAL LOW (ref >60)
GFR calc non Af Amer: 29 mL/min — ABNORMAL LOW (ref >60)
Glucose, Bld: 103 mg/dL — ABNORMAL HIGH (ref 70–99)
Phosphorus: 4.2 mg/dL (ref 2.3–4.6)
Potassium: 3.7 mEq/L (ref 3.5–5.1)
Potassium: 3.8 mEq/L (ref 3.5–5.1)
Sodium: 153 mEq/L — ABNORMAL HIGH (ref 135–145)

## 2011-07-19 LAB — STOOL CULTURE

## 2011-07-19 LAB — CROSSMATCH: ABO/RH(D): O POS

## 2011-07-19 LAB — CLOSTRIDIUM DIFFICILE EIA

## 2011-07-19 LAB — URINALYSIS, ROUTINE W REFLEX MICROSCOPIC
Glucose, UA: NEGATIVE mg/dL
Ketones, ur: NEGATIVE mg/dL
Nitrite: NEGATIVE
pH: 5.5 (ref 5.0–8.0)

## 2011-07-19 LAB — DIFFERENTIAL
Basophils Absolute: 0 10*3/uL (ref 0.0–0.1)
Basophils Relative: 0 % (ref 0–1)
Eosinophils Relative: 7 % — ABNORMAL HIGH (ref 0–5)
Monocytes Absolute: 0.6 10*3/uL (ref 0.1–1.0)
Neutro Abs: 6.8 10*3/uL (ref 1.7–7.7)

## 2011-07-19 LAB — CK TOTAL AND CKMB (NOT AT ARMC): Total CK: 46 U/L (ref 7–232)

## 2011-07-19 LAB — URINE MICROSCOPIC-ADD ON

## 2011-10-16 HISTORY — PX: CARDIOVASCULAR STRESS TEST: SHX262

## 2012-01-10 ENCOUNTER — Encounter (HOSPITAL_COMMUNITY): Payer: Self-pay | Admitting: *Deleted

## 2012-01-10 ENCOUNTER — Emergency Department (HOSPITAL_COMMUNITY): Payer: Medicare Other

## 2012-01-10 ENCOUNTER — Observation Stay (HOSPITAL_COMMUNITY)
Admission: EM | Admit: 2012-01-10 | Discharge: 2012-01-11 | Disposition: A | Discharge status: Home / Self Care | Payer: Medicare Other | Attending: Internal Medicine | Admitting: Internal Medicine

## 2012-01-10 ENCOUNTER — Other Ambulatory Visit: Payer: Self-pay

## 2012-01-10 DIAGNOSIS — R51 Headache: Secondary | ICD-10-CM | POA: Insufficient documentation

## 2012-01-10 DIAGNOSIS — I200 Unstable angina: Secondary | ICD-10-CM

## 2012-01-10 DIAGNOSIS — G43909 Migraine, unspecified, not intractable, without status migrainosus: Secondary | ICD-10-CM | POA: Diagnosis present

## 2012-01-10 DIAGNOSIS — R0602 Shortness of breath: Secondary | ICD-10-CM | POA: Insufficient documentation

## 2012-01-10 DIAGNOSIS — R079 Chest pain, unspecified: Secondary | ICD-10-CM | POA: Diagnosis present

## 2012-01-10 DIAGNOSIS — R0789 Other chest pain: Principal | ICD-10-CM | POA: Insufficient documentation

## 2012-01-10 HISTORY — DX: Gastrointestinal hemorrhage, unspecified: K92.2

## 2012-01-10 HISTORY — DX: Encounter for other specified aftercare: Z51.89

## 2012-01-10 HISTORY — DX: Reserved for inherently not codable concepts without codable children: IMO0001

## 2012-01-10 HISTORY — DX: Pneumonia, unspecified organism: J18.9

## 2012-01-10 HISTORY — DX: Personal history of peptic ulcer disease: Z87.11

## 2012-01-10 HISTORY — DX: Gastro-esophageal reflux disease without esophagitis: K21.9

## 2012-01-10 HISTORY — DX: Personal history of other diseases of the digestive system: Z87.19

## 2012-01-10 LAB — POCT I-STAT, CHEM 8
BUN: 20 mg/dL (ref 6–23)
Chloride: 105 mEq/L (ref 96–112)
HCT: 48 % (ref 39.0–52.0)
Potassium: 4.2 mEq/L (ref 3.5–5.1)
Sodium: 139 mEq/L (ref 135–145)

## 2012-01-10 LAB — CBC
Platelets: 228 10*3/uL (ref 150–400)
RBC: 5.03 MIL/uL (ref 4.22–5.81)
RDW: 13.4 % (ref 11.5–15.5)
WBC: 11.4 10*3/uL — ABNORMAL HIGH (ref 4.0–10.5)

## 2012-01-10 MED ORDER — NITROGLYCERIN IN D5W 200-5 MCG/ML-% IV SOLN
2.0000 ug/min | Freq: Once | INTRAVENOUS | Status: AC
  Administered 2012-01-10: 5 ug/min via INTRAVENOUS
  Filled 2012-01-10: qty 250

## 2012-01-10 MED ORDER — SODIUM CHLORIDE 0.9 % IV SOLN
INTRAVENOUS | Status: DC
  Administered 2012-01-11: via INTRAVENOUS

## 2012-01-10 MED ORDER — ASPIRIN 81 MG PO CHEW
324.0000 mg | CHEWABLE_TABLET | Freq: Once | ORAL | Status: AC
  Administered 2012-01-10: 324 mg via ORAL
  Filled 2012-01-10: qty 4

## 2012-01-10 MED ORDER — ZOLPIDEM TARTRATE 5 MG PO TABS
5.0000 mg | ORAL_TABLET | Freq: Once | ORAL | Status: AC
  Administered 2012-01-11: 5 mg via ORAL
  Filled 2012-01-10: qty 1

## 2012-01-10 MED ORDER — MORPHINE SULFATE 4 MG/ML IJ SOLN
4.0000 mg | Freq: Once | INTRAMUSCULAR | Status: AC
  Administered 2012-01-10: 4 mg via INTRAVENOUS
  Filled 2012-01-10: qty 1

## 2012-01-10 MED ORDER — ONDANSETRON HCL 4 MG/2ML IJ SOLN
4.0000 mg | Freq: Once | INTRAMUSCULAR | Status: AC
  Administered 2012-01-10: 4 mg via INTRAVENOUS
  Filled 2012-01-10: qty 2

## 2012-01-10 NOTE — ED Provider Notes (Addendum)
History     CSN: [id number]  Arrival date & time 01/10/12  1836   First MD Initiated Contact with Patient 01/10/12 2145      Chief Complaint  Patient presents with  . Chest Pain    (Consider location/radiation/quality/duration/timing/severity/associated sxs/prior treatment) HPI Comments: Patient is a 61 year old man who started with chest pain this afternoon. Felt a pain right of center of his chest. His arms felt numb. He is some radiation of the pain to the lateral chest. There is also some headache. He's had no prior episode of chest pain. He took no medicine for this pain. He has no history of heart disease, though) severe coronary artery disease his mother has had peripheral artery disease with iliac artery stenting. He has a past history of a severe staph infection, requiring surgery on his right leg, for which he has a intramedullary rod to stabilize the area that was infected. He does not smoke or drink.  Patient is a 61 y.o. male presenting with chest pain. The history is provided by the patient and medical records. No language interpreter was used.  Chest Pain The chest pain began 6 - 12 hours ago. Episode Length: Pain is a constant pain felt in the precordial region. At its most intense, the pain is at 8/10. The pain is currently at 6/10. The severity of the pain is moderate. The quality of the pain is described as pressure-like. Radiates to: Both arms felt numb. He has some radiation of the pain from the center of his chest to the sides of his chest. Exacerbated by: Nothing. Primary symptoms include shortness of breath and nausea. Pertinent negatives for primary symptoms include no fever and no dizziness.  Associated symptoms include diaphoresis. He tried nothing for the symptoms. Risk factors include being elderly, lack of exercise, male gender, obesity and sedentary lifestyle. Past medical history comments: MRSA sepsis.  His family medical history is significant for CAD in family.       History reviewed. No pertinent past medical history.  History reviewed. No pertinent past surgical history.  History reviewed. No pertinent family history.  History  Substance Use Topics  . Smoking status: Never Smoker   . Smokeless tobacco: Not on file  . Alcohol Use: No      Review of Systems  Constitutional: Positive for diaphoresis. Negative for fever and chills.  HENT: Negative.   Eyes: Negative.   Respiratory: Positive for shortness of breath.   Cardiovascular: Positive for chest pain. Negative for leg swelling.  Gastrointestinal: Positive for nausea.  Genitourinary: Negative.   Musculoskeletal: Negative.   Neurological: Negative.  Negative for dizziness.  Psychiatric/Behavioral: Negative.     Allergies  Peanut-containing drug products; Nsaids; and Percocet  Home Medications   Current Outpatient Rx  Name Route Sig Dispense Refill  . LORATADINE 10 MG PO TABS Oral Take 10 mg by mouth daily as needed. For allergies    . NAPROXEN SODIUM 220 MG PO TABS Oral Take 220 mg by mouth 2 (two) times daily as needed. For  pain      BP 144/79  Pulse 84  Temp(Src) 97.4 F (36.3 C) (Oral)  Resp 20  SpO2 95%  Physical Exam  Nursing note and vitals reviewed. Constitutional: He is oriented to person, place, and time. He appears well-developed and well-nourished.       In mild to moderate distress complaining of chest pain.  HENT:  Head: Normocephalic and atraumatic.  Right Ear: External ear normal.  Left Ear:  External ear normal.  Mouth/Throat: Oropharynx is clear and moist.  Eyes: Conjunctivae and EOM are normal. Pupils are equal, round, and reactive to light.  Neck: Normal range of motion. Neck supple.  Cardiovascular: Normal rate, regular rhythm and normal heart sounds.   Pulmonary/Chest: Effort normal and breath sounds normal.  Abdominal: Soft. Bowel sounds are normal.  Musculoskeletal: Normal range of motion.  Neurological: He is alert and oriented to  person, place, and time.       No sensory or motor deficits.  Skin: Skin is warm and dry.  Psychiatric: He has a normal mood and affect. His behavior is normal.    ED Course  CRITICAL CARE Performed by: Osvaldo Human Authorized by: Osvaldo Human Total critical care time: 30 minutes Critical care was necessary to treat or prevent imminent or life-threatening deterioration of the following conditions: evaluation of pt with persistent chest pain, Dx unstable angina.  Critical care was time spent personally by me on the following activities: discussions with consultants, evaluation of patient's response to treatment, examination of patient, obtaining history from patient or surrogate, ordering and review of laboratory studies, ordering and review of radiographic studies, re-evaluation of patient's condition and review of old charts.   (including critical care time)  Labs Reviewed  CBC - Abnormal; Notable for the following:    WBC 11.4 (*)    All other components within normal limits  POCT I-STAT, CHEM 8 - Abnormal; Notable for the following:    Creatinine, Ser 1.50 (*)    All other components within normal limits  POCT I-STAT TROPONIN I   Dg Chest 2 View  01/10/2012  *RADIOLOGY REPORT*  Clinical Data: Chest pain.  Right arm pain.  Cough.  Shortness of breath.  Ex-smoker.  CHEST - 2 VIEW  Comparison: 03/29/2009.  Findings: Normal sized heart with a mild decrease in size. Improved inspiration.  Interval small amount of increased density at the right lateral lung base.  Clear left lung.  Right shoulder degenerative changes are again demonstrated as well as thoracic spine degenerative changes.  IMPRESSION: Interval small amount of atelectasis or scarring at the right lateral lung base.  Original Report Authenticated By: Darrol Angel, M.D.    Date: 01/10/2012  Rate:84  Rhythm: normal sinus rhythm  QRS Axis: normal  Intervals: normal QRS:  Poor R wave progression in precordial  leads suggests possible old anterior myocardial infarction.  ST/T Wave abnormalities: normal  Conduction Disutrbances:none  Narrative Interpretation: Normal EKG  Old EKG Reviewed: unchanged  Results for orders placed during the hospital encounter of 01/10/12  CBC      Component Value Range   WBC 11.4 (*) 4.0 - 10.5 (K/uL)   RBC 5.03  4.22 - 5.81 (MIL/uL)   Hemoglobin 15.5  13.0 - 17.0 (g/dL)   HCT 42.7  06.2 - 37.6 (%)   MCV 88.3  78.0 - 100.0 (fL)   MCH 30.8  26.0 - 34.0 (pg)   MCHC 34.9  30.0 - 36.0 (g/dL)   RDW 28.3  15.1 - 76.1 (%)   Platelets 228  150 - 400 (K/uL)  POCT I-STAT, CHEM 8      Component Value Range   Sodium 139  135 - 145 (mEq/L)   Potassium 4.2  3.5 - 5.1 (mEq/L)   Chloride 105  96 - 112 (mEq/L)   BUN 20  6 - 23 (mg/dL)   Creatinine, Ser 6.07 (*) 0.50 - 1.35 (mg/dL)   Glucose, Bld 87  70 - 99 (mg/dL)   Calcium, Ion 1.61  0.96 - 1.32 (mmol/L)   TCO2 24  0 - 100 (mmol/L)   Hemoglobin 16.3  13.0 - 17.0 (g/dL)   HCT 04.5  40.9 - 81.1 (%)  POCT I-STAT TROPONIN I      Component Value Range   Troponin i, poc 0.00  0.00 - 0.08 (ng/mL)   Comment 3            Dg Chest 2 View  01/10/2012  *RADIOLOGY REPORT*  Clinical Data: Chest pain.  Right arm pain.  Cough.  Shortness of breath.  Ex-smoker.  CHEST - 2 VIEW  Comparison: 03/29/2009.  Findings: Normal sized heart with a mild decrease in size. Improved inspiration.  Interval small amount of increased density at the right lateral lung base.  Clear left lung.  Right shoulder degenerative changes are again demonstrated as well as thoracic spine degenerative changes.  IMPRESSION: Interval small amount of atelectasis or scarring at the right lateral lung base.  Original Report Authenticated By: Darrol Angel, M.D.    Pt seen --> physical exam performed.  Lab workup showed a negative TNI, EKG non-acute, chest x-ray essentially negative.  He continues to have substernal chest pain, rated now at a 4.  Rx with IV NTG, IV morphine  and Zofran.  Will need admission for chest pain ruleout.   1. Unstable angina      11:09 PM Pt's case discussed with Dr. Toniann Fail.  Admit to stepdown unit to [facility], Team 1, Dr. Sharon Seller.       Carleene Cooper III, MD 01/10/12 2310  Carleene Cooper III, MD 01/10/12 [phone]

## 2012-01-10 NOTE — ED Notes (Signed)
Pt reports central generalized chest discomfort that started this afternoon associated with headache, nausea, diaphoresis, and felling like he was going to pass out. Rates pain 7/10.

## 2012-01-11 ENCOUNTER — Encounter (HOSPITAL_COMMUNITY): Payer: Self-pay | Admitting: Internal Medicine

## 2012-01-11 ENCOUNTER — Emergency Department (HOSPITAL_COMMUNITY): Payer: Medicare Other

## 2012-01-11 DIAGNOSIS — R079 Chest pain, unspecified: Secondary | ICD-10-CM | POA: Diagnosis present

## 2012-01-11 DIAGNOSIS — G43909 Migraine, unspecified, not intractable, without status migrainosus: Secondary | ICD-10-CM | POA: Diagnosis present

## 2012-01-11 LAB — COMPREHENSIVE METABOLIC PANEL
AST: 15 U/L (ref 0–37)
Albumin: 3.5 g/dL (ref 3.5–5.2)
Alkaline Phosphatase: 77 U/L (ref 39–117)
BUN: 20 mg/dL (ref 6–23)
CO2: 26 mEq/L (ref 19–32)
Chloride: 100 mEq/L (ref 96–112)
Creatinine, Ser: 1.28 mg/dL (ref 0.50–1.35)
GFR calc non Af Amer: 59 mL/min — ABNORMAL LOW (ref >90)
Potassium: 3.9 mEq/L (ref 3.5–5.1)
Total Bilirubin: 1.7 mg/dL — ABNORMAL HIGH (ref 0.3–1.2)

## 2012-01-11 LAB — TSH: TSH: 11.258 u[IU]/mL — ABNORMAL HIGH (ref 0.350–4.500)

## 2012-01-11 LAB — CARDIAC PANEL(CRET KIN+CKTOT+MB+TROPI)
Relative Index: INVALID (ref 0.0–2.5)
Total CK: 98 U/L (ref 7–232)
Troponin I: <0.3 ng/mL (ref <0.30)
Troponin I: <0.3 ng/mL (ref <0.30)

## 2012-01-11 LAB — CBC
MCH: 29.8 pg (ref 26.0–34.0)
MCV: 89.2 fL (ref 78.0–100.0)
Platelets: 205 10*3/uL (ref 150–400)
RDW: 13.3 % (ref 11.5–15.5)
WBC: 9.3 10*3/uL (ref 4.0–10.5)

## 2012-01-11 LAB — GLUCOSE, CAPILLARY: Glucose-Capillary: 106 mg/dL — ABNORMAL HIGH (ref 70–99)

## 2012-01-11 LAB — MRSA PCR SCREENING: MRSA by PCR: NEGATIVE

## 2012-01-11 LAB — LIPID PANEL
LDL Cholesterol: 79 mg/dL (ref 0–99)
Total CHOL/HDL Ratio: 3.3 RATIO
VLDL: 19 mg/dL (ref 0–40)

## 2012-01-11 MED ORDER — ACETAMINOPHEN 325 MG PO TABS
650.0000 mg | ORAL_TABLET | Freq: Four times a day (QID) | ORAL | Status: DC | PRN
  Administered 2012-01-11: 650 mg via ORAL
  Filled 2012-01-11: qty 2

## 2012-01-11 MED ORDER — ONDANSETRON HCL 4 MG/2ML IJ SOLN: 4.0000 mg | Freq: Four times a day (QID) | INTRAMUSCULAR | Status: DC | PRN

## 2012-01-11 MED ORDER — NITROGLYCERIN IN D5W 200-5 MCG/ML-% IV SOLN: 2.0000 ug/min | INTRAVENOUS | Status: DC

## 2012-01-11 MED ORDER — ENOXAPARIN SODIUM 40 MG/0.4ML ~~LOC~~ SOLN
40.0000 mg | SUBCUTANEOUS | Status: DC
  Administered 2012-01-11: 40 mg via SUBCUTANEOUS
  Filled 2012-01-11: qty 0.4

## 2012-01-11 MED ORDER — MORPHINE SULFATE 2 MG/ML IJ SOLN: 1.0000 mg | INTRAMUSCULAR | Status: DC | PRN

## 2012-01-11 MED ORDER — PROMETHAZINE HCL 25 MG/ML IJ SOLN
12.5000 mg | Freq: Four times a day (QID) | INTRAMUSCULAR | Status: DC | PRN
  Administered 2012-01-11: 12.5 mg via INTRAVENOUS
  Filled 2012-01-11 (×2): qty 1

## 2012-01-11 MED ORDER — ONDANSETRON HCL 4 MG PO TABS: 4.0000 mg | ORAL_TABLET | Freq: Four times a day (QID) | ORAL | Status: DC | PRN

## 2012-01-11 MED ORDER — SODIUM CHLORIDE 0.9 % IV SOLN
INTRAVENOUS | Status: DC
  Administered 2012-01-11: 02:00:00 via INTRAVENOUS

## 2012-01-11 MED ORDER — ASPIRIN EC 325 MG PO TBEC
325.0000 mg | DELAYED_RELEASE_TABLET | Freq: Every day | ORAL | Status: DC
  Administered 2012-01-11: 325 mg via ORAL
  Filled 2012-01-11: qty 1

## 2012-01-11 MED ORDER — SODIUM CHLORIDE 0.9 % IJ SOLN
3.0000 mL | Freq: Two times a day (BID) | INTRAMUSCULAR | Status: DC
  Administered 2012-01-11 (×2): 3 mL via INTRAVENOUS

## 2012-01-11 NOTE — Progress Notes (Signed)
INITIAL ADULT NUTRITION ASSESSMENT Date: 01/11/2012   Time: 9:22 AM Reason for Assessment: Patient screened at nutrition risk for dysphagia  ASSESSMENT: Male 61 y.o.  Dx: Chest pain  Hx:  Past Medical History  Diagnosis Date  . Blood transfusion   . GERD (gastroesophageal reflux disease)   . History of stomach ulcers   . Lower GI bleeding   . Migraine     "definitely"  . Depression   . Acute kidney failure 08/2008  . Pneumonia 08/2008    "while in ICU"    Related Meds:  Scheduled Meds:   . aspirin  324 mg Oral Once  . aspirin EC  325 mg Oral Daily  . enoxaparin  40 mg Subcutaneous Q24H  .  morphine injection  4 mg Intravenous Once  . nitroGLYCERIN  2-200 mcg/min Intravenous Once  . ondansetron (ZOFRAN) IV  4 mg Intravenous Once  . sodium chloride  3 mL Intravenous Q12H  . zolpidem  5 mg Oral Once  . DISCONTD: sodium chloride   Intravenous STAT   Continuous Infusions:   . sodium chloride 50 mL/hr at 01/11/12 0145  . DISCONTD: nitroGLYCERIN Stopped (01/11/12 0700)   PRN Meds:.acetaminophen, morphine, ondansetron (ZOFRAN) IV, ondansetron, promethazine   Ht: 6' (182.9 cm)  Wt: 251 lb 1.7 oz (113.9 kg)  Ideal Wt: 80.9 kg % Ideal Wt: 140.4%  Usual Wt: 251 lb.  % Usual Wt: 100%   Body mass index is 34.06 kg/(m^2). (Obesity class 1)  Food/Nutrition Related Hx: Patient reported good appetite. PO intake documented 100% at meals. Patient stated he ate all of breakfast meal. Patient stated he is without chewing or swallowing problems but he did break his top dentures. Patient denies need to receive softer foods. Patient's son present at time of RD visit. His son expressed his concern that his father skips meals sometimes and is alone. Patient is a good candidate for some form of congregate meal program.   Labs:  CMP     Component Value Date/Time   NA 135 01/11/2012 0200   K 3.9 01/11/2012 0200   CL 100 01/11/2012 0200   CO2 26 01/11/2012 0200   GLUCOSE 96 01/11/2012  0200   BUN 20 01/11/2012 0200   CREATININE 1.28 01/11/2012 0200   CALCIUM 8.7 01/11/2012 0200   PROT 6.7 01/11/2012 0200   ALBUMIN 3.5 01/11/2012 0200   AST 15 01/11/2012 0200   ALT 11 01/11/2012 0200   ALKPHOS 77 01/11/2012 0200   BILITOT 1.7* 01/11/2012 0200   GFRNONAA 59* 01/11/2012 0200   GFRAA 68* 01/11/2012 0200    Intake/Output Summary (Last 24 hours) at 01/11/12 0924 Last data filed at 01/11/12 0800  Gross per 24 hour  Intake 350.35 ml  Output      0 ml  Net 350.35 ml     Diet Order: Cardiac  Supplements/Tube Feeding: none at this time  IVF:    sodium chloride Last Rate: 50 mL/hr at 01/11/12 0145  DISCONTD: nitroGLYCERIN Last Rate: Stopped (01/11/12 0700)    Estimated Nutritional Needs:   Kcal: 1610-9604 Protein: 91.2-136.9 grams Fluid: 500cc plus urine out-put  NUTRITION DIAGNOSIS: No nutrition diagnosis at this time   EDUCATION NEEDS: -No education needs identified at this time  INTERVENTION: 1. Recommend social work consult to set up meal assistance program for patient.  2. RD to monitor for nutrition needs PRN.   Dietitian [phone]  DOCUMENTATION CODES Per approved criteria  -Obesity Unspecified    Iven Finn [facility] 01/11/2012,  9:22 AM

## 2012-01-11 NOTE — Progress Notes (Signed)
01/11/2012 Kylan Liberati SPARKS Case Management Note [phone]  Utilization review completed.  

## 2012-01-11 NOTE — H&P (Signed)
Nicholas [name] is an 61 y.o. male.   PCP - Used to be [employer]. Chief Complaint: Chest pain. HPI: 61 year-old male with history of migraines and previous history of MRSA septic shock presented to the ER because of chest pain. Patient started to develop chest pain last afternoon which was retrosternal present even at rest, pressure-like sometimes radiating the back and the neck which progressively got worse and was also associated with exertion and there was shortness of breath. Denies any nausea vomiting abdominal pain diaphoresis palpitation or dizziness. Patient had headache which is bitemporal along with this chest pain. Patient came to the ER he had EKG cardiac enzymes chest x-rays which all did not show any acute. Since the pain was persistent he was started on IV nitroglycerin and that helped improve his chest pain. Patient states he usually gets headaches off and on but this is different from his chronic headaches. Denies any focal deficit.  Past Medical History  Diagnosis Date  . Migraine     Past Surgical History  Procedure Date  . Joint replacement     Family History  Problem Relation Age of Onset  . Coronary artery disease Father    Social History:  reports that he has never smoked. He does not have any smokeless tobacco history on file. He reports that he does not drink alcohol or use illicit drugs.  Allergies:  Allergies  Allergen Reactions  . Peanut-Containing Drug Products Anaphylaxis  . Nsaids     Disagrees with system  . Percocet (Oxycodone-Acetaminophen) Hives    Medications Prior to Admission  Medication Dose Route Frequency Provider Last Rate Last Dose  . 0.9 %  sodium chloride infusion   Intravenous STAT Nicholas Cooper III, MD 125 mL/hr at 01/11/12 0005    . aspirin chewable tablet 324 mg  324 mg Oral Once Nicholas Cooper III, MD   324 mg at 01/10/12 2243  . morphine 4 MG/ML injection 4 mg  4 mg Intravenous Once Nicholas Cooper III, MD   4 mg at 01/10/12 2246  .  nitroGLYCERIN 0.2 mg/mL in dextrose 5 % infusion  2-200 mcg/min Intravenous Once Nicholas Cooper III, MD 4.5 mL/hr at 01/11/12 0003 15 mcg/min at 01/11/12 0003  . ondansetron (ZOFRAN) injection 4 mg  4 mg Intravenous Once Nicholas Cooper III, MD   4 mg at 01/10/12 2244  . zolpidem (AMBIEN) tablet 5 mg  5 mg Oral Once Nicholas Clos, MD       No current outpatient prescriptions on file as of 01/10/2012.    Results for orders placed during the hospital encounter of 01/10/12 (from the past 48 hour(s))  CBC     Status: Abnormal   Collection Time   01/10/12  7:12 PM      Component Value Range Comment   WBC 11.4 (*) 4.0 - 10.5 (K/uL)    RBC 5.03  4.22 - 5.81 (MIL/uL)    Hemoglobin 15.5  13.0 - 17.0 (g/dL)    HCT 16.1  09.6 - 04.5 (%)    MCV 88.3  78.0 - 100.0 (fL)    MCH 30.8  26.0 - 34.0 (pg)    MCHC 34.9  30.0 - 36.0 (g/dL)    RDW 40.9  81.1 - 91.4 (%)    Platelets 228  150 - 400 (K/uL)   POCT I-STAT TROPONIN I     Status: Normal   Collection Time   01/10/12  7:38 PM      Component Value  Range Comment   Troponin i, poc 0.00  0.00 - 0.08 (ng/mL)    Comment 3            POCT I-STAT, CHEM 8     Status: Abnormal   Collection Time   01/10/12  7:39 PM      Component Value Range Comment   Sodium 139  135 - 145 (mEq/L)    Potassium 4.2  3.5 - 5.1 (mEq/L)    Chloride 105  96 - 112 (mEq/L)    BUN 20  6 - 23 (mg/dL)    Creatinine, Ser 9.56 (*) 0.50 - 1.35 (mg/dL)    Glucose, Bld 87  70 - 99 (mg/dL)    Calcium, Ion 2.13  1.12 - 1.32 (mmol/L)    TCO2 24  0 - 100 (mmol/L)    Hemoglobin 16.3  13.0 - 17.0 (g/dL)    HCT 08.6  57.8 - 46.9 (%)    Dg Chest 2 View  01/10/2012  *RADIOLOGY REPORT*  Clinical Data: Chest pain.  Right arm pain.  Cough.  Shortness of breath.  Ex-smoker.  CHEST - 2 VIEW  Comparison: 03/29/2009.  Findings: Normal sized heart with a mild decrease in size. Improved inspiration.  Interval small amount of increased density at the right lateral lung base.  Clear left lung.  Right  shoulder degenerative changes are again demonstrated as well as thoracic spine degenerative changes.  IMPRESSION: Interval small amount of atelectasis or scarring at the right lateral lung base.  Original Report Authenticated By: Nicholas [name], M.D.    Review of Systems  Constitutional: Negative.   Eyes: Negative.   Respiratory: Positive for shortness of breath.   Cardiovascular: Positive for chest pain.  Gastrointestinal: Negative.   Genitourinary: Negative.   Musculoskeletal: Negative.   Skin: Negative.   Neurological: Positive for headaches.  Endo/Heme/Allergies: Negative.   Psychiatric/Behavioral: Negative.     Blood pressure 131/86, pulse 93, temperature 97.4 F (36.3 C), temperature source Oral, resp. rate 18, SpO2 96.00%. Physical Exam  Constitutional: He appears well-developed and well-nourished. No distress.  HENT:  Head: Normocephalic and atraumatic.  Eyes: Conjunctivae are normal. Pupils are equal, round, and reactive to light. Right eye exhibits no discharge. Left eye exhibits no discharge. No scleral icterus.  Neck: Normal range of motion. Neck supple.  Cardiovascular: Normal rate and regular rhythm.   Respiratory: Effort normal and breath sounds normal. No respiratory distress. He has no wheezes.  GI: Soft. Bowel sounds are normal. He exhibits no distension. There is no tenderness.  Skin: He is not diaphoretic.     Assessment/Plan #1. Chest pain to rule out ACS - we will continue to cycle cardiac markers, continue IV nitroglycerin, aspirin and we will keep patient n.p.o. for now. Patient would like Dr. Jacinto Halim cardiologist as consult. Check d-dimer. #2. Headache - patient usually has chronic headaches but feels this is different. Get CT head and check sedimentation rate.  CODE STATUS - full code.  Nicholas [name] N. 01/11/2012, 12:11 AM

## 2012-01-11 NOTE — Progress Notes (Signed)
Pt education complete. Discharge instructions provided and understood by patient. IV removed, pt escorted out by RN.

## 2012-01-11 NOTE — Discharge Summary (Signed)
DISCHARGE SUMMARY  Nicholas [name]  MR#: [id number]  DOB:06-20-1951  Date of Admission: 01/10/2012 Date of Discharge: 01/11/2012  Attending Physician:Isadora Delorey  Patient's PCP: He has a new PCP - cannot recall name.   Consults:Treatment Team:  Pamella Pert, MD- cardiology  Presenting Complaint: Chest pain  Discharge Diagnoses: Principal Problem:  *Chest pain- musculoskeletal Active Problems:  Chronic headache    Discharge Medications: Medication List  As of 01/11/2012  3:52 PM   STOP taking these medications         naproxen sodium 220 MG tablet         TAKE these medications         loratadine 10 MG tablet   Commonly known as: CLARITIN   Take 10 mg by mouth daily as needed. For allergies             Procedures: Dg Chest 2 View  01/10/2012  *RADIOLOGY REPORT*  Clinical Data: Chest pain.  Right arm pain.  Cough.  Shortness of breath.  Ex-smoker.  CHEST - 2 VIEW  Comparison: 03/29/2009.  Findings: Normal sized heart with a mild decrease in size. Improved inspiration.  Interval small amount of increased density at the right lateral lung base.  Clear left lung.  Right shoulder degenerative changes are again demonstrated as well as thoracic spine degenerative changes.  IMPRESSION: Interval small amount of atelectasis or scarring at the right lateral lung base.  Original Report Authenticated By: Darrol Angel, M.D.   Ct Head Wo Contrast  01/11/2012  *RADIOLOGY REPORT*  Clinical Data: 61 year old male with headache  CT HEAD WITHOUT CONTRAST  Technique:  Contiguous axial images were obtained from the base of the skull through the vertex without contrast.  Comparison: 09/09/2008  Findings: No acute intracranial abnormalities are identified, including mass lesion or mass effect, hydrocephalus, extra-axial fluid collection, midline shift, hemorrhage, or acute infarction.  The visualized bony calvarium is unremarkable.  IMPRESSION: No evidence of acute intracranial  abnormality.  Original Report Authenticated By: Rosendo Gros, M.D.    echocardiogram  Hospital Course: Principal Problem:  *Chest pain 61 year-old male with history of headachess and previous history of MRSA septic shock presented to the ER because of chest pain. Further eval with 3 sets of cardiac enzymes reveals negative enzymes. A cardiology consult with Dr Jacinto Halim has been obtained and based on his judgement, the pain is likely musculoskeletal. I agree with this as it is reproducible on exam.   Elevated TSH Will need further Thyroid function tests as outpt which I have discussed with him.    Chronic headache He states he has a daily headache and points to his frontal area. He was taking Naprosyn daily for this which I have advised him not to do. I suspect he may either have sinus disease or allergies and recommend further outpt work up. CT of the head is negative.    Day of Discharge Physical Exam: BP 117/47  Pulse 68  Temp(Src) 97.4 F (36.3 C) (Oral)  Resp 21  Ht 6' (1.829 m)  Wt 113.9 kg (251 lb 1.7 oz)  BMI 34.06 kg/m2  SpO2 96%   Head: Normocephalic and atraumatic.  Eyes: Conjunctivae are normal. Pupils are equal, round, and reactive to light. Neck: Normal range of motion.  Cardiovascular: Normal rate and regular rhythm.  Respiratory: Effort normal and breath sounds normal. No respiratory distress. He has no wheezes.  GI: Soft. Bowel sounds are normal. He exhibits no distension. There is no tenderness Chest-  tenderness present on left lower chest Ext- No cyanosis, clubbing or edema  Results for orders placed during the hospital encounter of 01/10/12 (from the past 24 hour(s))  CBC     Status: Abnormal   Collection Time   01/10/12  7:12 PM      Component Value Range   WBC 11.4 (*) 4.0 - 10.5 (K/uL)   RBC 5.03  4.22 - 5.81 (MIL/uL)   Hemoglobin 15.5  13.0 - 17.0 (g/dL)   HCT 96.0  45.4 - 09.8 (%)   MCV 88.3  78.0 - 100.0 (fL)   MCH 30.8  26.0 - 34.0 (pg)   MCHC  34.9  30.0 - 36.0 (g/dL)   RDW 11.9  14.7 - 82.9 (%)   Platelets 228  150 - 400 (K/uL)  POCT I-STAT TROPONIN I     Status: Normal   Collection Time   01/10/12  7:38 PM      Component Value Range   Troponin i, poc 0.00  0.00 - 0.08 (ng/mL)   Comment 3           POCT I-STAT, CHEM 8     Status: Abnormal   Collection Time   01/10/12  7:39 PM      Component Value Range   Sodium 139  135 - 145 (mEq/L)   Potassium 4.2  3.5 - 5.1 (mEq/L)   Chloride 105  96 - 112 (mEq/L)   BUN 20  6 - 23 (mg/dL)   Creatinine, Ser 5.62 (*) 0.50 - 1.35 (mg/dL)   Glucose, Bld 87  70 - 99 (mg/dL)   Calcium, Ion 1.30  8.65 - 1.32 (mmol/L)   TCO2 24  0 - 100 (mmol/L)   Hemoglobin 16.3  13.0 - 17.0 (g/dL)   HCT 78.4  69.6 - 29.5 (%)  MRSA PCR SCREENING     Status: Normal   Collection Time   01/11/12  1:37 AM      Component Value Range   MRSA by PCR NEGATIVE  NEGATIVE   CARDIAC PANEL(CRET KIN+CKTOT+MB+TROPI)     Status: Normal   Collection Time   01/11/12  1:59 AM      Component Value Range   Total CK 98  7 - 232 (U/L)   CK, MB 1.8  0.3 - 4.0 (ng/mL)   Troponin I <0.30  <0.30 (ng/mL)   Relative Index RELATIVE INDEX IS INVALID  0.0 - 2.5   COMPREHENSIVE METABOLIC PANEL     Status: Abnormal   Collection Time   01/11/12  2:00 AM      Component Value Range   Sodium 135  135 - 145 (mEq/L)   Potassium 3.9  3.5 - 5.1 (mEq/L)   Chloride 100  96 - 112 (mEq/L)   CO2 26  19 - 32 (mEq/L)   Glucose, Bld 96  70 - 99 (mg/dL)   BUN 20  6 - 23 (mg/dL)   Creatinine, Ser 2.84  0.50 - 1.35 (mg/dL)   Calcium 8.7  8.4 - 13.2 (mg/dL)   Total Protein 6.7  6.0 - 8.3 (g/dL)   Albumin 3.5  3.5 - 5.2 (g/dL)   AST 15  0 - 37 (U/L)   ALT 11  0 - 53 (U/L)   Alkaline Phosphatase 77  39 - 117 (U/L)   Total Bilirubin 1.7 (*) 0.3 - 1.2 (mg/dL)   GFR calc non Af Amer 59 (*) >90 (mL/min)   GFR calc Af Amer 68 (*) >90 (mL/min)  CBC  Status: Normal   Collection Time   01/11/12  2:00 AM      Component Value Range   WBC 9.3  4.0 -  10.5 (K/uL)   RBC 4.80  4.22 - 5.81 (MIL/uL)   Hemoglobin 14.3  13.0 - 17.0 (g/dL)   HCT 65.7  84.6 - 96.2 (%)   MCV 89.2  78.0 - 100.0 (fL)   MCH 29.8  26.0 - 34.0 (pg)   MCHC 33.4  30.0 - 36.0 (g/dL)   RDW 95.2  84.1 - 32.4 (%)   Platelets 205  150 - 400 (K/uL)  TSH     Status: Abnormal   Collection Time   01/11/12  2:00 AM      Component Value Range   TSH 11.258 (*) 0.350 - 4.500 (uIU/mL)  SEDIMENTATION RATE     Status: Abnormal   Collection Time   01/11/12  2:00 AM      Component Value Range   Sed Rate 22 (*) 0 - 16 (mm/hr)  D-DIMER, QUANTITATIVE     Status: Abnormal   Collection Time   01/11/12  2:00 AM      Component Value Range   D-Dimer, Quant 0.55 (*) 0.00 - 0.48 (ug/mL-FEU)  LIPID PANEL     Status: Normal   Collection Time   01/11/12  2:00 AM      Component Value Range   Cholesterol 140  0 - 200 (mg/dL)   Triglycerides 93  <401 (mg/dL)   HDL 42  >02 (mg/dL)   Total CHOL/HDL Ratio 3.3     VLDL 19  0 - 40 (mg/dL)   LDL Cholesterol 79  0 - 99 (mg/dL)  GLUCOSE, CAPILLARY     Status: Abnormal   Collection Time   01/11/12  7:47 AM      Component Value Range   Glucose-Capillary 106 (*) 70 - 99 (mg/dL)  CARDIAC PANEL(CRET KIN+CKTOT+MB+TROPI)     Status: Normal   Collection Time   01/11/12  9:53 AM      Component Value Range   Total CK 96  7 - 232 (U/L)   CK, MB 1.8  0.3 - 4.0 (ng/mL)   Troponin I <0.30  <0.30 (ng/mL)   Relative Index RELATIVE INDEX IS INVALID  0.0 - 2.5     Disposition: stable   Follow-up Appts: Discharge Orders    Future Orders Please Complete By Expires   Diet - low sodium heart healthy      Increase activity slowly      Discharge instructions      Comments:   TSH- 11.258. You need thyroid function tests.      Follow-up with you PCP next week   Time on Discharge: >45 min  Signed: Barnard Sharps 01/11/2012, 3:52 PM

## 2012-01-11 NOTE — Consult Note (Addendum)
CARDIOLOGY CONSULT NOTE  Patient ID: KOHEN REITHER MRN: [id number] DOB/AGE: 1951/09/09 61 y.o.  Admit date: 01/10/2012 Referring Physician: Archie Balboa MD Primary Physician: Lillia Carmel, MD, MD Reason for Consultation Chest pain.  HPI: Patient is a 61 year old male with a history of MRSA infection, anemia following right knee replacement in 2009 and status post left big toe patient due to spread of infection to his great toe. He has otherwise no other significant past medical history. He was admitted to the hospital with chest pain that started yesterday afternoon 1 shopping at the grocery store. He points his finger to the left lower chest area and state that it is continuous worse with deep inspiration and he also felt some tingling numbness and mild diaphoresis. He rested whole afternoon get better. Over because of persistence he presented to the emergency department. He was sublingual nitroglycerin without any significant change in the intensity of the pain however states that after receiving morphine sulfate he felt much better and states that his pain is now pretty much gone. He did not have any other associated shortness of breath although taking deep breath would hurt on the left discharged. He denied any dizziness, syncope, palpitations, PND or orthopnea. No prior episode prior to this. Past Medical History  Diagnosis Date  . Blood transfusion   . GERD (gastroesophageal reflux disease)   . History of stomach ulcers   . Lower GI bleeding   . Migraine     "definitely"  . Depression   . Acute kidney failure 08/2008  . Pneumonia 08/2008    "while in ICU"     Past Surgical History  Procedure Date  . Joint replacement   . Knee fusion 03/2009    right knee removal; antibiotic spacers;   . Anterior  nerve transposition 07/2009    left ulnar nerve  . Toe amputation 08/2008    left great toe  . Antibiotic spacer exchange 11/2008; 08/2006    right knee  . Replacement total knee  08/2008; 09/2001    right  . Arthrotomy 08/2008    right knee w/I&D  . Hardware removal 04/2006    right knee w/antibiotic spacers placed  . Synovectomy 06/2005    debridement, liner exchange right knee  . Knee arthroscopy 10/2001    right  . Inguinal hernia repair early 1990's    bilateral     Family History  Problem Relation Age of Onset  . Coronary artery disease Father     Social History: History   Social History  . Marital Status: Divorced    Spouse Name: N/A    Number of Children: N/A  . Years of Education: N/A   Occupational History  . Not on file.   Social History Main Topics  . Smoking status: Never Smoker   . Smokeless tobacco: Never Used  . Alcohol Use: Yes     "I abused alcohol; last drink  ~ 2003"  . Drug Use: No  . Sexually Active: No   Other Topics Concern  . Not on file   Social History Narrative  . No narrative on file     Prescriptions prior to admission  Medication Sig Dispense Refill  . loratadine (CLARITIN) 10 MG tablet Take 10 mg by mouth daily as needed. For allergies      . naproxen sodium (ANAPROX) 220 MG tablet Take 220 mg by mouth 2 (two) times daily as needed. For  pain        ROS: General:  no fevers/chills/night sweats Eyes: no blurry vision, diplopia, or amaurosis ENT: no sore throat or hearing loss Resp: no cough, wheezing, or hemoptysis CV: no edema or palpitations GI: no abdominal pain, nausea, vomiting, diarrhea, or constipation GU: no dysuria, frequency, or hematuria Skin: no rash Neuro: no headache, numbness, tingling, or weakness of extremities Musculoskeletal: no joint pain or swelling Heme: no bleeding, DVT, or easy bruising Endo: no polydipsia or polyuria    Physical Exam: Blood pressure 83/43, pulse 55, temperature 97.4 F (36.3 C), temperature source Oral, resp. rate 17, height 6' (1.829 m), weight 113.9 kg (251 lb 1.7 oz), SpO2 94.00%.  Pt is alert and oriented, WD, WN, in no distress. HEENT: normal Neck:  JVP normal. Carotid upstrokes normal without bruits. No thyromegaly. Lungs: equal expansion, clear bilaterally CV: Apex is discrete and nondisplaced, RRR without murmur or gallop. There is tenderness present in the left seventh eighth ninth costochondral junction. The chest pain is easily reproducible. Abd: soft, NT, +BS, no bruit, no hepatosplenomegaly Back: no CVA tenderness Ext: no C/C/E. left big toe amputed previously        Femoral pulses 2+= without bruits        DP/PT pulses intact and = Skin: warm and dry without rash Neuro: CNII-XII intact             Strength intact = bilaterally  Labs:   Lab Results  Component Value Date   WBC 9.3 01/11/2012   HGB 14.3 01/11/2012   HCT 42.8 01/11/2012   MCV 89.2 01/11/2012   PLT 205 01/11/2012    Lab 01/11/12 0200  NA 135  K 3.9  CL 100  CO2 26  BUN 20  CREATININE 1.28  CALCIUM 8.7  PROT 6.7  BILITOT 1.7*  ALKPHOS 77  ALT 11  AST 15  GLUCOSE 96   Lab Results  Component Value Date   CKTOTAL 98 01/11/2012   CKMB 1.8 01/11/2012   TROPONINI <0.30 01/11/2012    Lab Results  Component Value Date   CHOL 140 01/11/2012   CHOL 150 10/15/2007   Lab Results  Component Value Date   HDL 42 01/11/2012   HDL 49 16/07/9603   Lab Results  Component Value Date   LDLCALC 79 01/11/2012   LDLCALC 77 10/15/2007   Lab Results  Component Value Date   TRIG 93 01/11/2012   TRIG 118 10/15/2007   Lab Results  Component Value Date   CHOLHDL 3.3 01/11/2012   CHOLHDL 3.1 Ratio 10/15/2007   No results found for this basename: LDLDIRECT      Radiology: Dg Chest 2 View  01/10/2012  *RADIOLOGY REPORT*  Clinical Data: Chest pain.  Right arm pain.  Cough.  Shortness of breath.  Ex-smoker.  CHEST - 2 VIEW  Comparison: 03/29/2009.  Findings: Normal sized heart with a mild decrease in size. Improved inspiration.  Interval small amount of increased density at the right lateral lung base.  Clear left lung.  Right shoulder degenerative changes are again  demonstrated as well as thoracic spine degenerative changes.  IMPRESSION: Interval small amount of atelectasis or scarring at the right lateral lung base.  Original Report Authenticated By: Darrol Angel, M.D.   Ct Head Wo Contrast  01/11/2012  *RADIOLOGY REPORT*  Clinical Data: 61 year old male with headache  CT HEAD WITHOUT CONTRAST  Technique:  Contiguous axial images were obtained from the base of the skull through the vertex without contrast.  Comparison: 09/09/2008  Findings: No acute intracranial abnormalities are  identified, including mass lesion or mass effect, hydrocephalus, extra-axial fluid collection, midline shift, hemorrhage, or acute infarction.  The visualized bony calvarium is unremarkable.  IMPRESSION: No evidence of acute intracranial abnormality.  Original Report Authenticated By: Rosendo Gros, M.D.    EKG: Normal sinus rhythm, normal intervals, no ischemia.  ASSESSMENT AND PLAN:  1. Chest pain most probably musculoskeletal. Tietze's disease.  normal echocardiogram with normal LV systolic function in 2009. I have canceled his echocardiogram.  2. Overweight Recommendation: I will stop his IV nitroglycerin and also amplification. We'll check one more set of cardiac markers and if they're negative he can be discharged home with outpatient evaluation. I will see him back in the office in 3-4 days or sooner if problems. His chest pain was continuous and by this time he had enzyme leak or EKG changes to suggest acute oral syndrome. Also he does not have any significant or lasting this factors. His lipid is also optimal. Would discharge him home on a TRAMADOL aspirin 81 mg by mouth daily.  Pamella Pert, MD 01/11/2012, 8:34 AM

## 2012-01-11 NOTE — ED Notes (Signed)
Patient transported to CT 

## 2012-01-11 NOTE — Progress Notes (Signed)
Clinical Social Worker received referral.  CSW attempted to meet with pt.  Pt currently with RN.  CSW will attempt to meet with pt again when pt available.   Angelia Mould, MSW, [place] [phone]

## 2012-08-26 ENCOUNTER — Emergency Department (HOSPITAL_COMMUNITY): Payer: Medicare Other

## 2012-08-26 ENCOUNTER — Emergency Department (HOSPITAL_COMMUNITY)
Admission: EM | Admit: 2012-08-26 | Discharge: 2012-08-27 | Disposition: A | Discharge status: Home / Self Care | Payer: Medicare Other | Attending: Emergency Medicine | Admitting: Emergency Medicine

## 2012-08-26 DIAGNOSIS — F329 Major depressive disorder, single episode, unspecified: Secondary | ICD-10-CM | POA: Insufficient documentation

## 2012-08-26 DIAGNOSIS — M549 Dorsalgia, unspecified: Secondary | ICD-10-CM

## 2012-08-26 DIAGNOSIS — Z8701 Personal history of pneumonia (recurrent): Secondary | ICD-10-CM | POA: Insufficient documentation

## 2012-08-26 DIAGNOSIS — R0789 Other chest pain: Secondary | ICD-10-CM | POA: Insufficient documentation

## 2012-08-26 DIAGNOSIS — Z87448 Personal history of other diseases of urinary system: Secondary | ICD-10-CM | POA: Insufficient documentation

## 2012-08-26 DIAGNOSIS — Z8669 Personal history of other diseases of the nervous system and sense organs: Secondary | ICD-10-CM | POA: Insufficient documentation

## 2012-08-26 DIAGNOSIS — Z8719 Personal history of other diseases of the digestive system: Secondary | ICD-10-CM | POA: Insufficient documentation

## 2012-08-26 DIAGNOSIS — F3289 Other specified depressive episodes: Secondary | ICD-10-CM | POA: Insufficient documentation

## 2012-08-26 LAB — CBC WITH DIFFERENTIAL/PLATELET
Basophils Absolute: 0.1 10*3/uL (ref 0.0–0.1)
Basophils Relative: 1 % (ref 0–1)
HCT: 41.4 % (ref 39.0–52.0)
Lymphocytes Relative: 19 % (ref 12–46)
MCHC: 34.8 g/dL (ref 30.0–36.0)
Monocytes Absolute: 1.1 10*3/uL — ABNORMAL HIGH (ref 0.1–1.0)
Neutro Abs: 6.9 10*3/uL (ref 1.7–7.7)
Neutrophils Relative %: 65 % (ref 43–77)
RDW: 13.7 % (ref 11.5–15.5)
WBC: 10.5 10*3/uL (ref 4.0–10.5)

## 2012-08-26 MED ORDER — FENTANYL CITRATE 0.05 MG/ML IJ SOLN
50.0000 ug | Freq: Once | INTRAMUSCULAR | Status: AC
  Administered 2012-08-26: 50 ug via INTRAVENOUS
  Filled 2012-08-26: qty 2

## 2012-08-26 NOTE — ED Notes (Signed)
Chest pain started about an hr ago shortly after eating; pt describes pain as sharp and radiating to back; worse with deep breathing and exertion; EMS gave pt 2 nitro; pain initially 9/10, then 2/10; pain better when lying flat; EKG clear; allergic to ASA; 20 g to upper left forearm;

## 2012-08-26 NOTE — ED Provider Notes (Addendum)
History     CSN: [id number]  Arrival date & time 08/26/12  2310   First MD Initiated Contact with Patient 08/26/12 2315      Chief Complaint  Patient presents with  . Back Pain    (Consider location/radiation/quality/duration/timing/severity/associated sxs/prior treatment) HPI Pt reports he has been feeling 'swimmy headed' during the day today. About 2 hours prior to arrival he reports onset of severe sharp mid back pain, worse with movement and deep breath, radiating to bilateral axilla. He denies having had any chest pain to me. Reports 'shallow breathing', but no shortness of breath, diaphoresis or nausea. He called EMS who gave him 2 NTG and laid him flat which helped his pain. Cannot take NSAIDs including ASA due to prior GI bleed and renal failure. Has had similar symptoms before, worked up by Dr. Jacinto Halim, including stress test about 8 months ago which was normal, per patient.   Past Medical History  Diagnosis Date  . Blood transfusion   . GERD (gastroesophageal reflux disease)   . History of stomach ulcers   . Lower GI bleeding   . Migraine     "definitely"  . Depression   . Acute kidney failure 08/2008  . Pneumonia 08/2008    "while in ICU"    Past Surgical History  Procedure Date  . Joint replacement   . Knee fusion 03/2009    right knee removal; antibiotic spacers;   . Anterior  nerve transposition 07/2009    left ulnar nerve  . Toe amputation 08/2008    left great toe  . Antibiotic spacer exchange 11/2008; 08/2006    right knee  . Replacement total knee 08/2008; 09/2001    right  . Arthrotomy 08/2008    right knee w/I&D  . Hardware removal 04/2006    right knee w/antibiotic spacers placed  . Synovectomy 06/2005    debridement, liner exchange right knee  . Knee arthroscopy 10/2001    right  . Inguinal hernia repair early 1990's    bilateral    Family History  Problem Relation Age of Onset  . Coronary artery disease Father     History  Substance Use  Topics  . Smoking status: Never Smoker   . Smokeless tobacco: Never Used  . Alcohol Use: Yes     Comment: "I abused alcohol; last drink  ~ 2003"      Review of Systems All other systems reviewed and are negative except as noted in HPI.   Allergies  Peanut-containing drug products; Percocet; and Nsaids  Home Medications   Current Outpatient Rx  Name  Route  Sig  Dispense  Refill  . LORATADINE 10 MG PO TABS   Oral   Take 10 mg by mouth daily as needed. For allergies           SpO2 94%  Physical Exam  Nursing note and vitals reviewed. Constitutional: He is oriented to person, place, and time. He appears well-developed and well-nourished.  HENT:  Head: Normocephalic and atraumatic.  Eyes: EOM are normal. Pupils are equal, round, and reactive to light.  Neck: Normal range of motion. Neck supple.  Cardiovascular: Normal rate, normal heart sounds and intact distal pulses.   Pulmonary/Chest: Effort normal and breath sounds normal. He exhibits tenderness.  Abdominal: Bowel sounds are normal. He exhibits no distension. There is no tenderness.  Musculoskeletal: Normal range of motion. He exhibits tenderness (mid thoracic tenderness mostly on R paraspinal muscles). He exhibits no edema.  Neurological: He is  alert and oriented to person, place, and time. He has normal strength. No cranial nerve deficit or sensory deficit.  Skin: Skin is warm and dry. No rash noted.  Psychiatric: He has a normal mood and affect.    ED Course  Procedures (including critical care time)  Labs Reviewed  CBC WITH DIFFERENTIAL - Abnormal; Notable for the following:    Monocytes Absolute 1.1 (*)     All other components within normal limits  BASIC METABOLIC PANEL - Abnormal; Notable for the following:    Glucose, Bld 106 (*)     BUN 26 (*)     GFR calc non Af Amer 61 (*)     GFR calc Af Amer 71 (*)     All other components within normal limits  TROPONIN I   Dg Chest 2 View  08/27/2012   *RADIOLOGY REPORT*  Clinical Data: Chest and back pain  CHEST - 2 VIEW  Comparison: 01/10/2012  Findings: Normal heart size.  No pleural effusion or edema.  No airspace consolidation.  The review of the visualized osseous structures is significant for mild spondylosis.  IMPRESSION:  1.  No acute cardiopulmonary abnormalities.   Original Report Authenticated By: Signa Kell, M.D.      No diagnosis found.    MDM  Doubt this is cardiac in etiology, will check labs, imaging, fentanyl for pain and reassess. Anticipate second cardiac markers.    Date: 08/27/2012  Rate: 63  Rhythm: normal sinus rhythm  QRS Axis: normal  Intervals: normal  ST/T Wave abnormalities: normal  Conduction Disutrbances: none  Narrative Interpretation: unremarkable   1:09 AM Pain improved. Will check second troponin.   2:19 AM Second troponin neg. Back pain with atypical musculoskeletal chest pain and recent negative cardiac workup. Doubt ACS. Will d/c with pain meds and PCP followup.   Kennedie Pardoe B. Bernette Mayers, MD 08/27/12 1610

## 2012-08-26 NOTE — ED Notes (Signed)
Patient transported to X-ray 

## 2012-08-27 LAB — TROPONIN I
Troponin I: <0.3 ng/mL (ref <0.30)
Troponin I: <0.3 ng/mL (ref <0.30)

## 2012-08-27 LAB — BASIC METABOLIC PANEL
CO2: 26 mEq/L (ref 19–32)
Chloride: 105 mEq/L (ref 96–112)
GFR calc Af Amer: 71 mL/min — ABNORMAL LOW (ref >90)
Potassium: 4.8 mEq/L (ref 3.5–5.1)
Sodium: 140 mEq/L (ref 135–145)

## 2012-08-27 MED ORDER — HYDROCODONE-ACETAMINOPHEN 5-325 MG PO TABS
2.0000 | ORAL_TABLET | Freq: Four times a day (QID) | ORAL | Status: DC | PRN
Start: 2012-08-27 — End: 2014-01-26

## 2012-08-27 NOTE — ED Notes (Signed)
Will inform MD that patient reports itching now/at discharge.

## 2012-10-12 ENCOUNTER — Ambulatory Visit (INDEPENDENT_AMBULATORY_CARE_PROVIDER_SITE_OTHER): Payer: Medicare Other | Admitting: Emergency Medicine

## 2012-10-12 VITALS — BP 133/84 | HR 79 | Temp 97.9°F | Resp 20 | Ht 70.0 in | Wt 237.6 lb

## 2012-10-12 DIAGNOSIS — J209 Acute bronchitis, unspecified: Secondary | ICD-10-CM

## 2012-10-12 DIAGNOSIS — J018 Other acute sinusitis: Secondary | ICD-10-CM

## 2012-10-12 MED ORDER — HYDROCOD POLST-CHLORPHEN POLST 10-8 MG/5ML PO LQCR: 5.0000 mL | Freq: Two times a day (BID) | ORAL | Status: DC | PRN

## 2012-10-12 MED ORDER — PSEUDOEPHEDRINE-GUAIFENESIN ER 60-600 MG PO TB12: 1.0000 | ORAL_TABLET | Freq: Two times a day (BID) | ORAL | Status: DC

## 2012-10-12 MED ORDER — AMOXICILLIN-POT CLAVULANATE 875-125 MG PO TABS: 1.0000 | ORAL_TABLET | Freq: Two times a day (BID) | ORAL | Status: DC

## 2012-10-12 NOTE — Patient Instructions (Signed)

## 2012-10-12 NOTE — Progress Notes (Signed)
[facility] [address], [place] [address] [phone]  Date:  10/12/2012   Name:  Nicholas [name]   DOB:  [birth year]   MRN:  [id number]  PCP:  Jackie Plum, MD    Chief Complaint: Headache, Cough and Nasal Congestion   History of Present Illness:  Nicholas [name] is a 61 y.o. very pleasant male patient who presents with the following:  Ill with sinus congestion and drainage.  Says that it started 10 days ago worse over past week.  No improvement with OTC medication. Green nasal drainage and post nasal drip.  Cough productive green sputum.  No fever or chills. No wheezing or shortness of breath.  Pain and pressure in maxillary and frontal region.  Headache.  Patient Active Problem List  Diagnosis  . CLOSTRIDIUM DIFFICILE COLITIS  . METHICILLIN SUSCEPTIBLE STAPH INF CCE & UNS SITE  . ULNAR NERVE ENTRAPMENT, LEFT  . ECZEMA  . OSTEOARTHRITIS  . DEGENERATIVE DISC DISEASE  . OLECRANON BURSITIS, LEFT  . MALAR AND MAXILLARY BONES CLOSED FRACTURE  . KNEE REPLACEMENT, HX OF  . Chest pain  . Chronic headache    Past Medical History  Diagnosis Date  . Blood transfusion   . GERD (gastroesophageal reflux disease)   . History of stomach ulcers   . Lower GI bleeding   . Migraine     "definitely"  . Depression   . Acute kidney failure 08/2008  . Pneumonia 08/2008    "while in ICU"    Past Surgical History  Procedure Date  . Joint replacement   . Knee fusion 03/2009    right knee removal; antibiotic spacers;   . Anterior  nerve transposition 07/2009    left ulnar nerve  . Toe amputation 08/2008    left great toe  . Antibiotic spacer exchange 11/2008; 08/2006    right knee  . Replacement total knee 08/2008; 09/2001    right  . Arthrotomy 08/2008    right knee w/I&D  . Hardware removal 04/2006    right knee w/antibiotic spacers placed  . Synovectomy 06/2005    debridement, liner exchange right knee  . Knee arthroscopy 10/2001    right  .  Inguinal hernia repair early 1990's    bilateral    History  Substance Use Topics  . Smoking status: Never Smoker   . Smokeless tobacco: Never Used  . Alcohol Use: Yes     Comment: "I abused alcohol; last drink  ~ 2003"    Family History  Problem Relation Age of Onset  . Coronary artery disease Father   . Heart disease Mother     Allergies  Allergen Reactions  . Peanut-Containing Drug Products Anaphylaxis and Dermatitis  . Percocet (Oxycodone-Acetaminophen) Hives  . Nsaids     Disagrees with system    Medication list has been reviewed and updated.  Current Outpatient Prescriptions on File Prior to Visit  Medication Sig Dispense Refill  . diphenhydrAMINE (SOMINEX) 25 MG tablet Take 25 mg by mouth at bedtime as needed.      [name] HYDROcodone-acetaminophen (NORCO/VICODIN) 5-325 MG per tablet Take 2 tablets by mouth every 6 (six) hours as needed for pain.  30 tablet  0  . loratadine (CLARITIN) 10 MG tablet Take 10 mg by mouth daily as needed. For allergies        Review of Systems:  As per HPI, otherwise negative.    Physical Examination: Filed Vitals:   10/12/12 1514  BP:  133/84  Pulse: 79  Temp: 97.9 F (36.6 C)  Resp: 20   Filed Vitals:   10/12/12 1514  Height: 5\' 10"  (1.778 m)  Weight: 237 lb 9.6 oz (107.775 kg)   Body mass index is 34.09 kg/(m^2). Ideal Body Weight: Weight in (lb) to have BMI = 25: 173.9   GEN: WDWN, NAD, Non-toxic, A & O x 3 HEENT: Atraumatic, Normocephalic. Neck supple. No masses, No LAD. Ears and Nose: No external deformity. CV: RRR, No M/G/R. No JVD. No thrill. No extra heart sounds. PULM: CTA B, no wheezes, crackles, rhonchi. No retractions. No resp. distress. No accessory muscle use. ABD: S, NT, ND, +BS. No rebound. No HSM. EXTR: No c/c/e NEURO  Abnormal gait due to fused right knee.  PSYCH: Normally interactive. Conversant. Not depressed or anxious appearing.  Calm demeanor.    Assessment and  Plan: Bronchitis Sinusitis augmentin mucinex d tussionex  Carmelina Dane, MD

## 2012-11-15 HISTORY — PX: COLONOSCOPY: SHX174

## 2012-11-19 ENCOUNTER — Encounter: Payer: Self-pay | Admitting: Internal Medicine

## 2012-12-03 ENCOUNTER — Ambulatory Visit (AMBULATORY_SURGERY_CENTER): Payer: Medicare Other | Admitting: *Deleted

## 2012-12-03 VITALS — Ht 72.0 in | Wt 245.0 lb

## 2012-12-03 DIAGNOSIS — Z1211 Encounter for screening for malignant neoplasm of colon: Secondary | ICD-10-CM

## 2012-12-03 MED ORDER — NA SULFATE-K SULFATE-MG SULF 17.5-3.13-1.6 GM/177ML PO SOLN: ORAL | Status: DC

## 2012-12-11 ENCOUNTER — Encounter: Payer: Self-pay | Admitting: Internal Medicine

## 2012-12-11 ENCOUNTER — Ambulatory Visit (AMBULATORY_SURGERY_CENTER): Payer: Medicare Other | Admitting: Internal Medicine

## 2012-12-11 VITALS — BP 120/74 | HR 65 | Temp 97.9°F | Resp 20 | Ht 72.0 in | Wt 245.0 lb

## 2012-12-11 DIAGNOSIS — Z86010 Personal history of colonic polyps: Secondary | ICD-10-CM

## 2012-12-11 MED ORDER — SODIUM CHLORIDE 0.9 % IV SOLN: 500.0000 mL | INTRAVENOUS | Status: DC

## 2012-12-11 NOTE — Patient Instructions (Addendum)
You have diverticulosis but no signs of polyps or cancer. The prep was excellent!  There was a very mild rash in the anal area - likely from this prep and should resolve soon. You did describe some chronic intermittent itching and i recommend finding a cream or lotion with pramoxine in it. This is available in pharmacies, over the counter.  Next routine colonoscopy in 2024.  Thank you for choosing me and [facility].  Iva Boop, MD, FACG  YOU HAD AN ENDOSCOPIC PROCEDURE TODAY AT THE [facility]: Refer to the procedure report that was given to you for any specific questions about what was found during the examination.  If the procedure report does not answer your questions, please call your gastroenterologist to clarify.  If you requested that your care partner not be given the details of your procedure findings, then the procedure report has been included in a sealed envelope for you to review at your convenience later.  YOU SHOULD EXPECT: Some feelings of bloating in the abdomen. Passage of more gas than usual.  Walking can help get rid of the air that was put into your GI tract during the procedure and reduce the bloating. If you had a lower endoscopy (such as a colonoscopy or flexible sigmoidoscopy) you may notice spotting of blood in your stool or on the toilet paper. If you underwent a bowel prep for your procedure, then you may not have a normal bowel movement for a few days.  DIET: Your first meal following the procedure should be a light meal and then it is ok to progress to your normal diet.  A half-sandwich or bowl of soup is an example of a good first meal.  Heavy or fried foods are harder to digest and may make you feel nauseous or bloated.  Likewise meals heavy in dairy and vegetables can cause extra gas to form and this can also increase the bloating.  Drink plenty of fluids but you should avoid alcoholic beverages for 24 hours.  ACTIVITY: Your care  partner should take you home directly after the procedure.  You should plan to take it easy, moving slowly for the rest of the day.  You can resume normal activity the day after the procedure however you should NOT DRIVE or use heavy machinery for 24 hours (because of the sedation medicines used during the test).    SYMPTOMS TO REPORT IMMEDIATELY: A gastroenterologist can be reached at any hour.  During normal business hours, 8:30 AM to 5:00 PM Monday through Friday, call [phone].  After hours and on weekends, please call the GI answering service at [phone] who will take a message and have the physician on call contact you.   Following lower endoscopy (colonoscopy or flexible sigmoidoscopy):  Excessive amounts of blood in the stool  Significant tenderness or worsening of abdominal pains  Swelling of the abdomen that is new, acute  Fever of 100F or higher  FOLLOW UP: If any biopsies were taken you will be contacted by phone or by letter within the next 1-3 weeks.  Call your gastroenterologist if you have not heard about the biopsies in 3 weeks.  Our staff will call the home number listed on your records the next business day following your procedure to check on you and address any questions or concerns that you may have at that time regarding the information given to you following your procedure. This is a courtesy call and so if there is  no answer at the home number and we have not heard from you through the emergency physician on call, we will assume that you have returned to your regular daily activities without incident.  SIGNATURES/CONFIDENTIALITY: You and/or your care partner have signed paperwork which will be entered into your electronic medical record.  These signatures attest to the fact that that the information above on your After Visit Summary has been reviewed and is understood.  Full responsibility of the confidentiality of this discharge information lies with you and/or  your care-partner.  Diverticulosis-handout given  Repeat colonoscopy in 10 years

## 2012-12-11 NOTE — Op Note (Signed)
[facility] [address] [place], [address]   COLONOSCOPY PROCEDURE REPORT  PATIENT: Nicholas, [name]  MR#: [id number] BIRTHDATE: 12/19/1950 , 62  yrs. old GENDER: Male ENDOSCOPIST: Iva Boop, MD, [facility] PROCEDURE DATE:  12/11/2012 PROCEDURE:   Colonoscopy, screening/ surveillance ASA CLASS:   Class II INDICATIONS:Screening and surveillance,personal history of colonic polyps.   adenoma removed 2004 MEDICATIONS: propofol (Diprivan) 300mg  IV, MAC sedation, administered by CRNA, and These medications were titrated to patient response per physician's verbal order  DESCRIPTION OF PROCEDURE:   After the risks benefits and alternatives of the procedure were thoroughly explained, informed consent was obtained.  A digital rectal exam revealed no abnormalities of the rectum, A digital rectal exam revealed no prostatic nodules, and A digital rectal exam revealed the prostate was not enlarged.   The LB [id number]  endoscope was introduced through the anus and advanced to the cecum, which was identified by both the appendix and ileocecal valve. No adverse events experienced.   The quality of the prep was Suprep excellent The instrument was then slowly withdrawn as the colon was fully examined.      COLON FINDINGS: Moderate diverticulosis was noted in the sigmoid colon.   The colon mucosa was otherwise normal.   A right colon retroflexion was performed.  Retroflexed views revealed no abnormalities. The time to cecum=2 minutes 21 seconds.  Withdrawal time=9 minutes 33 seconds.  The scope was withdrawn and the procedure completed. COMPLICATIONS: There were no complications.  ENDOSCOPIC IMPRESSION: 1.   Moderate diverticulosis was noted in the sigmoid colon 2.   The colon mucosa was otherwise normal w/ excellent prep in patient w/ hx of adenoma in 2004 - none since.  RECOMMENDATIONS: Repeat Colonscopy in 10 years.   eSigned:  Iva Boop, MD, [facility]  12/11/2012 11:55 AM   cc: Jackie Plum MD and The Patient

## 2012-12-11 NOTE — Progress Notes (Signed)
Patient did not experience any of the following events: a burn prior to discharge; a fall within the facility; wrong site/side/patient/procedure/implant event; or a hospital transfer or hospital admission upon discharge from the facility. (G8907) Patient did not have preoperative order for IV antibiotic SSI prophylaxis. (G8918)  

## 2012-12-12 ENCOUNTER — Telehealth: Payer: Self-pay | Admitting: *Deleted

## 2012-12-12 NOTE — Telephone Encounter (Signed)
  Follow up Call-  Call back number 12/11/2012  Post procedure Call Back phone  # [phone]  Permission to leave phone message Yes     Patient questions:  Do you have a fever, pain , or abdominal swelling? no Pain Score  0 *  Have you tolerated food without any problems? yes  Have you been able to return to your normal activities? yes  Do you have any questions about your discharge instructions: Diet   no Medications  no Follow up visit  no  Do you have questions or concerns about your Care? no  Actions: * If pain score is 4 or above: No action needed, pain <4.

## 2013-04-26 ENCOUNTER — Emergency Department (HOSPITAL_COMMUNITY)
Admission: EM | Admit: 2013-04-26 | Discharge: 2013-04-26 | Disposition: A | Discharge status: Home / Self Care | Payer: Medicare Other | Attending: Emergency Medicine | Admitting: Emergency Medicine

## 2013-04-26 ENCOUNTER — Emergency Department (HOSPITAL_COMMUNITY): Payer: Medicare Other

## 2013-04-26 ENCOUNTER — Encounter (HOSPITAL_COMMUNITY): Payer: Self-pay | Admitting: *Deleted

## 2013-04-26 ENCOUNTER — Ambulatory Visit (INDEPENDENT_AMBULATORY_CARE_PROVIDER_SITE_OTHER): Payer: Medicare Other | Admitting: Emergency Medicine

## 2013-04-26 ENCOUNTER — Other Ambulatory Visit (HOSPITAL_COMMUNITY): Payer: Medicare Other

## 2013-04-26 VITALS — BP 138/78 | HR 59 | Temp 97.9°F | Resp 18 | Ht 69.75 in | Wt 240.0 lb

## 2013-04-26 DIAGNOSIS — Y929 Unspecified place or not applicable: Secondary | ICD-10-CM | POA: Insufficient documentation

## 2013-04-26 DIAGNOSIS — R42 Dizziness and giddiness: Secondary | ICD-10-CM

## 2013-04-26 DIAGNOSIS — Z8619 Personal history of other infectious and parasitic diseases: Secondary | ICD-10-CM | POA: Insufficient documentation

## 2013-04-26 DIAGNOSIS — H53149 Visual discomfort, unspecified: Secondary | ICD-10-CM | POA: Insufficient documentation

## 2013-04-26 DIAGNOSIS — Z8701 Personal history of pneumonia (recurrent): Secondary | ICD-10-CM | POA: Insufficient documentation

## 2013-04-26 DIAGNOSIS — Z8673 Personal history of transient ischemic attack (TIA), and cerebral infarction without residual deficits: Secondary | ICD-10-CM | POA: Insufficient documentation

## 2013-04-26 DIAGNOSIS — W1809XA Striking against other object with subsequent fall, initial encounter: Secondary | ICD-10-CM | POA: Insufficient documentation

## 2013-04-26 DIAGNOSIS — R51 Headache: Secondary | ICD-10-CM

## 2013-04-26 DIAGNOSIS — H538 Other visual disturbances: Secondary | ICD-10-CM | POA: Insufficient documentation

## 2013-04-26 DIAGNOSIS — J45909 Unspecified asthma, uncomplicated: Secondary | ICD-10-CM | POA: Insufficient documentation

## 2013-04-26 DIAGNOSIS — Z8659 Personal history of other mental and behavioral disorders: Secondary | ICD-10-CM | POA: Insufficient documentation

## 2013-04-26 DIAGNOSIS — S0990XA Unspecified injury of head, initial encounter: Secondary | ICD-10-CM

## 2013-04-26 DIAGNOSIS — Y939 Activity, unspecified: Secondary | ICD-10-CM | POA: Insufficient documentation

## 2013-04-26 DIAGNOSIS — Z87448 Personal history of other diseases of urinary system: Secondary | ICD-10-CM | POA: Insufficient documentation

## 2013-04-26 DIAGNOSIS — S8990XA Unspecified injury of unspecified lower leg, initial encounter: Secondary | ICD-10-CM | POA: Insufficient documentation

## 2013-04-26 DIAGNOSIS — Z8679 Personal history of other diseases of the circulatory system: Secondary | ICD-10-CM | POA: Insufficient documentation

## 2013-04-26 DIAGNOSIS — I509 Heart failure, unspecified: Secondary | ICD-10-CM | POA: Insufficient documentation

## 2013-04-26 DIAGNOSIS — Z8719 Personal history of other diseases of the digestive system: Secondary | ICD-10-CM | POA: Insufficient documentation

## 2013-04-26 MED ORDER — HYDROCODONE-ACETAMINOPHEN 5-325 MG PO TABS: 2.0000 | ORAL_TABLET | Freq: Four times a day (QID) | ORAL | Status: DC | PRN

## 2013-04-26 NOTE — ED Provider Notes (Signed)
History    CSN: [id number] Arrival date & time 04/26/13  1552  First MD Initiated Contact with Patient 04/26/13 1611     Chief Complaint  Patient presents with  . Dizziness   (Consider location/radiation/quality/duration/timing/severity/associated sxs/prior Treatment) HPI Comments: Patient presents today with a chief complaint of dizziness, headache and photophobia.  He reports that he has been feeling dizzy after falling and hitting his head yesterday.  He reports that he tripped over a stepping stone and hit his head on concrete.  He denies LOC.  He denies nausea or vomiting.  He does report that his vision has been blurry occasionally and he has also had some photophobia since the fall.  He denies any focal weakness, new numbness or tingling, visual field defects, facial drooping, or difficulty speaking.  He is also complaining of pain in the right knee extending down into the tibia and up into the femur area.  He has had surgery of his right leg in the past and has a rod in place.  He has taken Tylenol for the pain without relief.  He is currently not on any blood thinning medications.   The history is provided by the patient.   Past Medical History  Diagnosis Date  . Blood transfusion   . GERD (gastroesophageal reflux disease)   . History of stomach ulcers   . Lower GI bleeding   . Migraine     "definitely"  . Depression   . Acute kidney failure 08/2008  . Pneumonia 08/2008    "while in ICU"  . CLOSTRIDIUM DIFFICILE COLITIS 07/04/2010    Annotation: 12/09, 2/10 Qualifier: Diagnosis of  By: [name], [name]    . Personal history of colonic adenoma 06/01/2003  . CHF (congestive heart failure)   . Seizures   . Asthma   . Heart attack     2009 personal history of 3  . Stroke    Past Surgical History  Procedure Laterality Date  . Joint replacement    . Knee fusion  03/2009    right knee removal; antibiotic spacers;   . Anterior  nerve transposition  07/2009    left ulnar  nerve  . Toe amputation  08/2008    left great toe  . Antibiotic spacer exchange  11/2008; 08/2006    right knee  . Replacement total knee  08/2008; 09/2001    right  . Arthrotomy  08/2008    right knee w/I&D  . Hardware removal  04/2006    right knee w/antibiotic spacers placed  . Synovectomy  06/2005    debridement, liner exchange right knee  . Knee arthroscopy  10/2001    right  . Inguinal hernia repair  early 1990's    bilateral   Family History  Problem Relation Age of Onset  . Coronary artery disease Father   . Heart disease Mother   . Colon cancer Neg Hx   . Stomach cancer Neg Hx   . Cancer Sister     breast  . Diabetes Brother   . Diabetes Sister    History  Substance Use Topics  . Smoking status: Never Smoker   . Smokeless tobacco: Never Used  . Alcohol Use: Yes     Comment: "I abused alcohol; last drink  ~ 2005"    Review of Systems  Eyes: Positive for photophobia.  Neurological: Positive for dizziness and headaches.  All other systems reviewed and are negative.    Allergies  Peanut-containing drug products; Percocet;  and Nsaids  Home Medications   Current Outpatient Rx  Name  Route  Sig  Dispense  Refill  . diphenhydrAMINE (BENADRYL) 25 mg capsule   Oral   Take 25 mg by mouth every 6 (six) hours as needed for itching.         [name] HYDROcodone-acetaminophen (NORCO/VICODIN) 5-325 MG per tablet   Oral   Take 2 tablets by mouth every 6 (six) hours as needed for pain.   30 tablet   0    BP 142/83  Pulse 59  Temp(Src) 98.2 F (36.8 C) (Oral)  Resp 18  SpO2 97% Physical Exam  Nursing note and vitals reviewed. Constitutional: He appears well-developed and well-nourished.  HENT:  Head: Normocephalic and atraumatic.  Eyes: EOM are normal. Pupils are equal, round, and reactive to light.  Neck: Normal range of motion. Neck supple. No spinous process tenderness present.  Cardiovascular: Normal rate, regular rhythm and normal heart sounds.    Pulses:      Dorsalis pedis pulses are 2+ on the right side, and 2+ on the left side.  Pulmonary/Chest: Effort normal and breath sounds normal.  Musculoskeletal: Normal range of motion.  Neurological: He is alert. He has normal strength. No cranial nerve deficit. Coordination and gait normal.  Decreased sensation of the right foot, which patient reports is baseline  Skin: Skin is warm and dry.  Psychiatric: He has a normal mood and affect.    ED Course  Procedures (including critical care time) Labs Reviewed - No data to display Dg Femur Right  04/26/2013   *RADIOLOGY REPORT*  Clinical Data: Fall, pain.  RIGHT FEMUR - 2 VIEW  Comparison: Plain films 03/29/2009.  Findings: Again seen is an IM nail with a single proximal interlocking screw for fusion of the right knee.  Hardware is intact.  There is no fracture or dislocation.  Bridging bone is seen across the knee.  IMPRESSION: No acute finding.  Status post right knee fusion.   Original Report Authenticated By: Holley Dexter, M.D.   Dg Tibia/fibula Right  04/26/2013   *RADIOLOGY REPORT*  Clinical Data: Status post fall.  Pain.  Right leg pain.  RIGHT TIBIA AND FIBULA - 2 VIEW  Comparison: Plain films right leg 03/29/2009.  Findings: Again seen is an IM nail for fusion of the knee.  There is solid bridging bone across the joint.  Hardware is intact.  No fracture is identified.  IMPRESSION: No acute finding.  Status post fusion of the right knee.   Original Report Authenticated By: Holley Dexter, M.D.   Ct Head Wo Contrast  04/26/2013   *RADIOLOGY REPORT*  Clinical Data: Fall yesterday with dizziness.  Right hand numbness and tingling.  Headache and blurred vision.  CT HEAD WITHOUT CONTRAST  Technique:  Contiguous axial images were obtained from the base of the skull through the vertex without contrast.  Comparison: 01/11/2012.  Findings:  Calvarium: No acute osseous abnormality.  No lytic or blastic lesion.  Orbits: No acute abnormality.   Brain: No evidence of acute abnormality, such as acute infarction, hemorrhage, hydrocephalus, or mass lesion/mass effect.  IMPRESSION: No evidence of acute intracranial abnormality.   Original Report Authenticated By: Tiburcio Pea   No diagnosis found.  MDM  Patient presents with dizziness, HA, and photophobia that has been present since falling and hitting his head yesterday.  He denies any LOC with the fall.  He has a normal neurological exam.  Head CT negative.  Patient also complaining of pain  of his right leg.  Xrays negative.  Patient is ambulatory.  Feel that the patient is stable for discharge.  Return precautions given.  Pascal Lux [place], PA-C 04/27/13 [phone]

## 2013-04-26 NOTE — ED Notes (Signed)
Pt [facility].  Pt states that he fell yesterday afternoon on his right side of his head, no LOC, and now with dizziness and lightheadedness.  Pt states the fall was accidental.

## 2013-04-26 NOTE — ED Provider Notes (Signed)
4:34 PM  Date: 04/26/2013  Rate: 64  Rhythm: normal sinus rhythm  QRS Axis: left  Intervals: normal QRS:  Low voltage QRS;  Poor R wave progression in precordial leads suggests old anterior myocardial infarction.  ST/T Wave abnormalities: normal  Conduction Disutrbances:left anterior fascicular block  Narrative Interpretation: Abnormal EKG  Old EKG Reviewed: changes noted--QRS axis was normal on tracing done on 08/27/2012.    Carleene Cooper III, MD 04/26/13 [phone]

## 2013-04-26 NOTE — Progress Notes (Addendum)
[facility] [address], [place] [address] [phone]  Date:  04/26/2013   Name:  [name]   DOB:  04/16/1951   MRN:  [id number]  PCP:  Jackie Plum, MD    Chief Complaint: Patient fell in yard yesterday.   History of Present Illness:  Nicholas [name] is a 62 y.o. very pleasant male patient who presents with the following:  Out in yard yesterday using a weed wacker and tripped on a cobblestone and fell, injuring his right side.  He struck his right cheek on the ground.  Had no LOC.  Has no visual symptoms.  Now has persistent headache and some dizziness.  No nausea or vomiting. No neck pain.  Denies other injury.  Denies other complaint or health concern today.   Patient Active Problem List   Diagnosis Date Noted  . Chest pain 01/11/2012  . Chronic headache 01/11/2012  . METHICILLIN SUSCEPTIBLE STAPH INF CCE & UNS SITE 07/04/2010  . ULNAR NERVE ENTRAPMENT, LEFT 07/04/2010  . ECZEMA 07/04/2010  . DEGENERATIVE DISC DISEASE 07/04/2010  . OLECRANON BURSITIS, LEFT 07/04/2010  . MALAR AND MAXILLARY BONES CLOSED FRACTURE 07/04/2010  . KNEE REPLACEMENT, HX OF 07/04/2010  . OSTEOARTHRITIS 06/09/2007  . Personal history of colonic adenoma 06/01/2003    Past Medical History  Diagnosis Date  . Blood transfusion   . GERD (gastroesophageal reflux disease)   . History of stomach ulcers   . Lower GI bleeding   . Migraine     "definitely"  . Depression   . Acute kidney failure 08/2008  . Pneumonia 08/2008    "while in ICU"  . CLOSTRIDIUM DIFFICILE COLITIS 07/04/2010    Annotation: 12/09, 2/10 Qualifier: Diagnosis of  By: [name], [name]    . Personal history of colonic adenoma 06/01/2003  . CHF (congestive heart failure)   . Seizures   . Asthma   . Heart attack     2009 personal history of 3  . Stroke     Past Surgical History  Procedure Laterality Date  . Joint replacement    . Knee fusion  03/2009    right knee removal; antibiotic  spacers;   . Anterior  nerve transposition  07/2009    left ulnar nerve  . Toe amputation  08/2008    left great toe  . Antibiotic spacer exchange  11/2008; 08/2006    right knee  . Replacement total knee  08/2008; 09/2001    right  . Arthrotomy  08/2008    right knee w/I&D  . Hardware removal  04/2006    right knee w/antibiotic spacers placed  . Synovectomy  06/2005    debridement, liner exchange right knee  . Knee arthroscopy  10/2001    right  . Inguinal hernia repair  early 1990's    bilateral    History  Substance Use Topics  . Smoking status: Never Smoker   . Smokeless tobacco: Never Used  . Alcohol Use: Yes     Comment: "I abused alcohol; last drink  ~ 2005"    Family History  Problem Relation Age of Onset  . Coronary artery disease Father   . Heart disease Mother   . Colon cancer Neg Hx   . Stomach cancer Neg Hx   . Cancer Sister     breast  . Diabetes Brother   . Diabetes Sister     Allergies  Allergen Reactions  . Peanut-Containing Drug Products Anaphylaxis and Dermatitis  .  Percocet (Oxycodone-Acetaminophen) Hives  . Nsaids     Disagrees with system    Medication list has been reviewed and updated.  Current Outpatient Prescriptions on File Prior to Visit  Medication Sig Dispense Refill  . diphenhydrAMINE (BENADRYL) 25 mg capsule Take 25 mg by mouth every 6 (six) hours as needed for itching.      [name] HYDROcodone-acetaminophen (NORCO/VICODIN) 5-325 MG per tablet Take 2 tablets by mouth every 6 (six) hours as needed for pain.  30 tablet  0   No current facility-administered medications on file prior to visit.    Review of Systems:  As per HPI, otherwise negative.    Physical Examination: Filed Vitals:   04/26/13 1440  BP: 138/78  Pulse: 59  Temp: 97.9 F (36.6 C)  Resp: 18   Filed Vitals:   04/26/13 1440  Height: 5' 9.75" (1.772 m)  Weight: 240 lb (108.863 kg)   Body mass index is 34.67 kg/(m^2). Ideal Body Weight: Weight in (lb) to  have BMI = 25: 172.6  GEN: WDWN, NAD, Non-toxic, A & O x 3 HEENT: Atraumatic, Normocephalic. Neck supple. No masses, No LAD. Ears and Nose: No external deformity. CV: RRR, No M/G/R. No JVD. No thrill. No extra heart sounds. PULM: CTA B, no wheezes, crackles, rhonchi. No retractions. No resp. distress. No accessory muscle use. ABD: S, NT, ND, +BS. No rebound. No HSM. EXTR: No c/c/e NEURO Normal gait. PRRERLA EOMI CN 2-12 intact. PSYCH: Normally interactive. Conversant. Not depressed or anxious appearing.  Calm demeanor.    Assessment and Plan: Closed head injury Vertigo CT HEAD   Signed,  Phillips Odor, MD   Patient failed to report for CT scan as he was instructed, instead he went to the ER to be re examined.

## 2013-04-26 NOTE — ED Notes (Signed)
Pt states he was sent here by Dr Dareen Piano for a CT of his head. Pt was suppose to tell registration that he was here for only CT and was no to be and ER pt. Pt asked if he still wanted to stay and be seen by ER MD. Pt states he does want to stay and wants to be on the safe side and be evaluated by ER physician to make sure that everything is ok. Pt states that he fell yesterday and became dizzy today. Pt states his right hand is little numb and tingling. Pt states he has a headache with occasional blurred vision. Pt denies n/v/d. Pt denies chest pain. Pt alert and mentating appropriately. NAD noted at this time.

## 2013-04-26 NOTE — ED Notes (Signed)
Pt alert and mentating appropriately. Pt given d/c teaching and follow up care instructions. NAD noted upon d/c. Pt leaving with significant other. Pt verbalizes understanding of d/c teaching and has no further questions upon d/c. Pt ambulatory leaving.

## 2013-04-27 ENCOUNTER — Telehealth: Payer: Self-pay

## 2013-04-27 NOTE — Telephone Encounter (Signed)
Noted, to you FYI

## 2013-04-27 NOTE — ED Provider Notes (Signed)
Medical screening examination/treatment/procedure(s) were performed by non-physician practitioner and as supervising physician I was immediately available for consultation/collaboration.   Carleene Cooper III, MD 04/27/13 [phone]

## 2013-04-27 NOTE — Telephone Encounter (Signed)
Pt wanted [place] to know that he did have a concussion, was prescribed Vicodin and plans to return next week to recheck with Dr. Dareen Piano.  [phone]

## 2013-05-05 ENCOUNTER — Ambulatory Visit (HOSPITAL_BASED_OUTPATIENT_CLINIC_OR_DEPARTMENT_OTHER)
Admission: RE | Admit: 2013-05-05 | Discharge: 2013-05-05 | Disposition: A | Discharge status: Home / Self Care | Payer: Medicare Other | Source: Ambulatory Visit | Attending: Emergency Medicine | Admitting: Emergency Medicine

## 2013-05-05 ENCOUNTER — Ambulatory Visit (INDEPENDENT_AMBULATORY_CARE_PROVIDER_SITE_OTHER): Payer: Medicare Other | Admitting: Emergency Medicine

## 2013-05-05 VITALS — BP 106/68 | HR 65 | Temp 97.8°F | Resp 18 | Ht 71.0 in | Wt 242.0 lb

## 2013-05-05 DIAGNOSIS — R209 Unspecified disturbances of skin sensation: Secondary | ICD-10-CM | POA: Insufficient documentation

## 2013-05-05 DIAGNOSIS — M25569 Pain in unspecified knee: Secondary | ICD-10-CM

## 2013-05-05 DIAGNOSIS — M79609 Pain in unspecified limb: Secondary | ICD-10-CM | POA: Insufficient documentation

## 2013-05-05 DIAGNOSIS — M25561 Pain in right knee: Secondary | ICD-10-CM

## 2013-05-05 NOTE — Progress Notes (Signed)
[facility] [address], [place] [address] [phone]  Date:  05/05/2013   Name:  Nicholas [name]   DOB:  02-26-1951   MRN:  [id number]  PCP:  Jackie Plum, MD    Chief Complaint: Follow-up   History of Present Illness:  Nicholas [name] is a 62 y.o. very pleasant male patient who presents with the following:  Seen in office and referred for a CT scan and was instead seen in the ER.  Now says that he is nearly pain free with vicodin and wants to have ir prescribed chronically.  He is having persistent pain in his leg following a knee joint fusion.    Patient Active Problem List   Diagnosis Date Noted  . Chest pain 01/11/2012  . Chronic headache 01/11/2012  . METHICILLIN SUSCEPTIBLE STAPH INF CCE & UNS SITE 07/04/2010  . ULNAR NERVE ENTRAPMENT, LEFT 07/04/2010  . ECZEMA 07/04/2010  . DEGENERATIVE DISC DISEASE 07/04/2010  . OLECRANON BURSITIS, LEFT 07/04/2010  . MALAR AND MAXILLARY BONES CLOSED FRACTURE 07/04/2010  . KNEE REPLACEMENT, HX OF 07/04/2010  . OSTEOARTHRITIS 06/09/2007  . Personal history of colonic adenoma 06/01/2003    Past Medical History  Diagnosis Date  . Blood transfusion   . GERD (gastroesophageal reflux disease)   . History of stomach ulcers   . Lower GI bleeding   . Migraine     "definitely"  . Depression   . Acute kidney failure 08/2008  . Pneumonia 08/2008    "while in ICU"  . CLOSTRIDIUM DIFFICILE COLITIS 07/04/2010    Annotation: 12/09, 2/10 Qualifier: Diagnosis of  By: [name], [name]    . Personal history of colonic adenoma 06/01/2003  . CHF (congestive heart failure)   . Seizures   . Asthma   . Heart attack     2009 personal history of 3  . Stroke     Past Surgical History  Procedure Laterality Date  . Joint replacement    . Knee fusion  03/2009    right knee removal; antibiotic spacers;   . Anterior  nerve transposition  07/2009    left ulnar nerve  . Toe amputation  08/2008    left great toe  .  Antibiotic spacer exchange  11/2008; 08/2006    right knee  . Replacement total knee  08/2008; 09/2001    right  . Arthrotomy  08/2008    right knee w/I&D  . Hardware removal  04/2006    right knee w/antibiotic spacers placed  . Synovectomy  06/2005    debridement, liner exchange right knee  . Knee arthroscopy  10/2001    right  . Inguinal hernia repair  early 1990's    bilateral    History  Substance Use Topics  . Smoking status: Never Smoker   . Smokeless tobacco: Never Used  . Alcohol Use: Yes     Comment: "I abused alcohol; last drink  ~ 2005"    Family History  Problem Relation Age of Onset  . Coronary artery disease Father   . Heart disease Mother   . Colon cancer Neg Hx   . Stomach cancer Neg Hx   . Cancer Sister     breast  . Diabetes Brother   . Diabetes Sister     Allergies  Allergen Reactions  . Peanut-Containing Drug Products Anaphylaxis and Dermatitis  . Percocet (Oxycodone-Acetaminophen) Hives  . Nsaids     Disagrees with system    Medication list  has been reviewed and updated.  Current Outpatient Prescriptions on File Prior to Visit  Medication Sig Dispense Refill  . HYDROcodone-acetaminophen (NORCO/VICODIN) 5-325 MG per tablet Take 2 tablets by mouth every 6 (six) hours as needed for pain.  30 tablet  0  . HYDROcodone-acetaminophen (NORCO/VICODIN) 5-325 MG per tablet Take 2 tablets by mouth every 6 (six) hours as needed for pain.  30 tablet  0  . naproxen (NAPROSYN) 250 MG tablet Take 250 mg by mouth daily as needed.      . diphenhydrAMINE (BENADRYL) 25 mg capsule Take 25 mg by mouth every 6 (six) hours as needed for itching.       No current facility-administered medications on file prior to visit.    Review of Systems:  As per HPI, otherwise negative.    Physical Examination: Filed Vitals:   05/05/13 1613  BP: 106/68  Pulse: 65  Temp: 97.8 F (36.6 C)  Resp: 18   Filed Vitals:   05/05/13 1613  Height: 5\' 11"  (1.803 m)  Weight: 242  lb (109.77 kg)   Body mass index is 33.77 kg/(m^2). Ideal Body Weight: Weight in (lb) to have BMI = 25: 178.9   GEN: WDWN, NAD, Non-toxic, Alert & Oriented x 3 HEENT: Atraumatic, Normocephalic.  Ears and Nose: No external deformity. EXTR: No clubbing/cyanosis/edema NEURO: Normal gait.  PSYCH: Normally interactive. Conversant. Not depressed or anxious appearing.  Calm demeanor.  No change in his lower extremity examination.  Assessment and Plan: Pain in right leg. Pain referral   Signed,  Phillips Odor, MD

## 2013-05-19 ENCOUNTER — Ambulatory Visit (INDEPENDENT_AMBULATORY_CARE_PROVIDER_SITE_OTHER): Payer: Medicare Other | Admitting: Emergency Medicine

## 2013-05-19 VITALS — BP 132/86 | HR 80 | Temp 98.2°F | Resp 18 | Ht 71.0 in | Wt 241.0 lb

## 2013-05-19 DIAGNOSIS — L259 Unspecified contact dermatitis, unspecified cause: Secondary | ICD-10-CM

## 2013-05-19 MED ORDER — TRIAMCINOLONE ACETONIDE 0.1 % EX CREA: TOPICAL_CREAM | Freq: Three times a day (TID) | CUTANEOUS | Status: DC

## 2013-05-19 NOTE — Progress Notes (Signed)
[facility] [address] [place] [address] [phone]  Date:  05/19/2013   Name:  Nicholas [name]   DOB:  1951/07/02   MRN:  [id number]  PCP:  Jackie Plum, MD    Chief Complaint: Rash   History of Present Illness:  Nicholas [name] is a 62 y.o. very pleasant male patient who presents with the following:  Doing yardwork and eating peanuts.  Now has a rash on his left elbow, behind the right knee and across the shoulders and neck. He says the rash is very pruritic and he has been scratching it extensively.  No fever or chills.  No known allergen exposure.  No improvement with topical benadryl.  Denies other complaint or health concern today.   Patient Active Problem List   Diagnosis Date Noted  . Chest pain 01/11/2012  . Chronic headache 01/11/2012  . METHICILLIN SUSCEPTIBLE STAPH INF CCE & UNS SITE 07/04/2010  . ULNAR NERVE ENTRAPMENT, LEFT 07/04/2010  . ECZEMA 07/04/2010  . DEGENERATIVE DISC DISEASE 07/04/2010  . OLECRANON BURSITIS, LEFT 07/04/2010  . MALAR AND MAXILLARY BONES CLOSED FRACTURE 07/04/2010  . KNEE REPLACEMENT, HX OF 07/04/2010  . OSTEOARTHRITIS 06/09/2007  . Personal history of colonic adenoma 06/01/2003    Past Medical History  Diagnosis Date  . Blood transfusion   . GERD (gastroesophageal reflux disease)   . History of stomach ulcers   . Lower GI bleeding   . Migraine     "definitely"  . Depression   . Acute kidney failure 08/2008  . Pneumonia 08/2008    "while in ICU"  . CLOSTRIDIUM DIFFICILE COLITIS 07/04/2010    Annotation: 12/09, 2/10 Qualifier: Diagnosis of  By: [name], [name]    . Personal history of colonic adenoma 06/01/2003  . CHF (congestive heart failure)   . Seizures   . Asthma   . Heart attack     2009 personal history of 3  . Stroke     Past Surgical History  Procedure Laterality Date  . Joint replacement    . Knee fusion  03/2009    right knee removal; antibiotic spacers;   . Anterior  nerve  transposition  07/2009    left ulnar nerve  . Toe amputation  08/2008    left great toe  . Antibiotic spacer exchange  11/2008; 08/2006    right knee  . Replacement total knee  08/2008; 09/2001    right  . Arthrotomy  08/2008    right knee w/I&D  . Hardware removal  04/2006    right knee w/antibiotic spacers placed  . Synovectomy  06/2005    debridement, liner exchange right knee  . Knee arthroscopy  10/2001    right  . Inguinal hernia repair  early 1990's    bilateral    History  Substance Use Topics  . Smoking status: Never Smoker   . Smokeless tobacco: Never Used  . Alcohol Use: Yes     Comment: "I abused alcohol; last drink  ~ 2005"    Family History  Problem Relation Age of Onset  . Coronary artery disease Father   . Heart disease Mother   . Colon cancer Neg Hx   . Stomach cancer Neg Hx   . Cancer Sister     breast  . Diabetes Brother   . Diabetes Sister     Allergies  Allergen Reactions  . Peanut-Containing Drug Products Anaphylaxis and Dermatitis  . Percocet (Oxycodone-Acetaminophen) Hives  .  Nsaids     Disagrees with system    Medication list has been reviewed and updated.  Current Outpatient Prescriptions on File Prior to Visit  Medication Sig Dispense Refill  . diphenhydrAMINE (BENADRYL) 25 mg capsule Take 25 mg by mouth every 6 (six) hours as needed for itching.      [name] HYDROcodone-acetaminophen (NORCO/VICODIN) 5-325 MG per tablet Take 2 tablets by mouth every 6 (six) hours as needed for pain.  30 tablet  0  . HYDROcodone-acetaminophen (NORCO/VICODIN) 5-325 MG per tablet Take 2 tablets by mouth every 6 (six) hours as needed for pain.  30 tablet  0  . naproxen (NAPROSYN) 250 MG tablet Take 250 mg by mouth daily as needed.       No current facility-administered medications on file prior to visit.    Review of Systems:  As per HPI, otherwise negative.    Physical Examination: Filed Vitals:   05/19/13 1152  BP: 132/86  Pulse: 80  Temp: 98.2 F  (36.8 C)  Resp: 18   Filed Vitals:   05/19/13 1152  Height: 5\' 11"  (1.803 m)  Weight: 241 lb (109.317 kg)   Body mass index is 33.63 kg/(m^2). Ideal Body Weight: Weight in (lb) to have BMI = 25: 178.9   GEN: WDWN, NAD, Non-toxic, Alert & Oriented x 3 HEENT: Atraumatic, Normocephalic.  Ears and Nose: No external deformity. EXTR: No clubbing/cyanosis/edema NEURO: Normal gait.  PSYCH: Normally interactive. Conversant. Not depressed or anxious appearing.  Calm demeanor.  SKIN:  Excoriated erythematous eruption.    Assessment and Plan: Contact dermatitis NO PEANUTS TAC Benadryl   Signed,  Phillips Odor, MD

## 2013-05-19 NOTE — Patient Instructions (Addendum)

## 2013-05-21 ENCOUNTER — Encounter: Payer: Self-pay | Admitting: Physical Medicine & Rehabilitation

## 2013-06-23 ENCOUNTER — Ambulatory Visit: Payer: Medicare Other | Admitting: Physical Medicine & Rehabilitation

## 2013-07-01 ENCOUNTER — Ambulatory Visit (INDEPENDENT_AMBULATORY_CARE_PROVIDER_SITE_OTHER): Payer: Medicare Other | Admitting: Radiology

## 2013-07-01 DIAGNOSIS — Z23 Encounter for immunization: Secondary | ICD-10-CM

## 2013-08-02 ENCOUNTER — Ambulatory Visit (INDEPENDENT_AMBULATORY_CARE_PROVIDER_SITE_OTHER): Payer: Medicare Other | Admitting: Emergency Medicine

## 2013-08-02 VITALS — BP 118/72 | HR 69 | Temp 97.8°F | Resp 18 | Ht 71.0 in | Wt 239.0 lb

## 2013-08-02 DIAGNOSIS — J209 Acute bronchitis, unspecified: Secondary | ICD-10-CM

## 2013-08-02 DIAGNOSIS — J018 Other acute sinusitis: Secondary | ICD-10-CM

## 2013-08-02 MED ORDER — PSEUDOEPHEDRINE-GUAIFENESIN ER 60-600 MG PO TB12: 1.0000 | ORAL_TABLET | Freq: Two times a day (BID) | ORAL | Status: DC

## 2013-08-02 MED ORDER — FLUTICASONE PROPIONATE 50 MCG/ACT NA SUSP: 2.0000 | Freq: Every day | NASAL | Status: DC

## 2013-08-02 MED ORDER — HYDROCOD POLST-CHLORPHEN POLST 10-8 MG/5ML PO LQCR: 5.0000 mL | Freq: Two times a day (BID) | ORAL | Status: DC | PRN

## 2013-08-02 MED ORDER — AMOXICILLIN-POT CLAVULANATE 875-125 MG PO TABS: 1.0000 | ORAL_TABLET | Freq: Two times a day (BID) | ORAL | Status: DC

## 2013-08-02 NOTE — Patient Instructions (Signed)

## 2013-08-02 NOTE — Progress Notes (Signed)
[facility] [address] [place] [address] [phone]  Date:  08/02/2013   Name:  Nicholas [name]   DOB:  Sep 15, 1951   MRN:  [id number]  PCP:  Jackie Plum, MD    Chief Complaint: Sore Throat, Cough and Headache   History of Present Illness:  Nicholas [name] is a 62 y.o. very pleasant male patient who presents with the following:  Ill 3 days with purulent nasal drainage and post nasal drip.  Sore throat, cough productive green sputum with no wheezing or shortness of breath.  Thinks he may have had a fever.  No nausea or vomiting. Has a frontal headache and fatigue.  No improvement with over the counter medications or other home remedies. Denies other complaint or health concern today.   Patient Active Problem List   Diagnosis Date Noted  . Chest pain 01/11/2012  . Chronic headache 01/11/2012  . METHICILLIN SUSCEPTIBLE STAPH INF CCE & UNS SITE 07/04/2010  . ULNAR NERVE ENTRAPMENT, LEFT 07/04/2010  . ECZEMA 07/04/2010  . DEGENERATIVE DISC DISEASE 07/04/2010  . OLECRANON BURSITIS, LEFT 07/04/2010  . MALAR AND MAXILLARY BONES CLOSED FRACTURE 07/04/2010  . KNEE REPLACEMENT, HX OF 07/04/2010  . OSTEOARTHRITIS 06/09/2007  . Personal history of colonic adenoma 06/01/2003    Past Medical History  Diagnosis Date  . Blood transfusion   . GERD (gastroesophageal reflux disease)   . History of stomach ulcers   . Lower GI bleeding   . Migraine     "definitely"  . Depression   . Acute kidney failure 08/2008  . Pneumonia 08/2008    "while in ICU"  . CLOSTRIDIUM DIFFICILE COLITIS 07/04/2010    Annotation: 12/09, 2/10 Qualifier: Diagnosis of  By: [name], [name]    . Personal history of colonic adenoma 06/01/2003  . CHF (congestive heart failure)   . Seizures   . Asthma   . Heart attack     2009 personal history of 3  . Stroke     Past Surgical History  Procedure Laterality Date  . Joint replacement    . Knee fusion  03/2009    right knee  removal; antibiotic spacers;   . Anterior  nerve transposition  07/2009    left ulnar nerve  . Toe amputation  08/2008    left great toe  . Antibiotic spacer exchange  11/2008; 08/2006    right knee  . Replacement total knee  08/2008; 09/2001    right  . Arthrotomy  08/2008    right knee w/I&D  . Hardware removal  04/2006    right knee w/antibiotic spacers placed  . Synovectomy  06/2005    debridement, liner exchange right knee  . Knee arthroscopy  10/2001    right  . Inguinal hernia repair  early 1990's    bilateral    History  Substance Use Topics  . Smoking status: Never Smoker   . Smokeless tobacco: Never Used  . Alcohol Use: Yes     Comment: "I abused alcohol; last drink  ~ 2005"    Family History  Problem Relation Age of Onset  . Coronary artery disease Father   . Heart disease Mother   . Colon cancer Neg Hx   . Stomach cancer Neg Hx   . Cancer Sister     breast  . Diabetes Brother   . Diabetes Sister     Allergies  Allergen Reactions  . Peanut-Containing Drug Products Anaphylaxis and Dermatitis  .  Percocet [Oxycodone-Acetaminophen] Hives  . Nsaids     Disagrees with system    Medication list has been reviewed and updated.  Current Outpatient Prescriptions on File Prior to Visit  Medication Sig Dispense Refill  . naproxen (NAPROSYN) 250 MG tablet Take 250 mg by mouth daily as needed.      . triamcinolone cream (KENALOG) 0.1 % Apply topically 3 (three) times daily.  45 g  0  . HYDROcodone-acetaminophen (NORCO/VICODIN) 5-325 MG per tablet Take 2 tablets by mouth every 6 (six) hours as needed for pain.  30 tablet  0  . HYDROcodone-acetaminophen (NORCO/VICODIN) 5-325 MG per tablet Take 2 tablets by mouth every 6 (six) hours as needed for pain.  30 tablet  0   No current facility-administered medications on file prior to visit.    Review of Systems:  As per HPI, otherwise negative.    Physical Examination: Filed Vitals:   08/02/13 1120  BP: 118/72   Pulse: 69  Temp: 97.8 F (36.6 C)  Resp: 18   Filed Vitals:   08/02/13 1120  Height: 5\' 11"  (1.803 m)  Weight: 239 lb (108.41 kg)   Body mass index is 33.35 kg/(m^2). Ideal Body Weight: Weight in (lb) to have BMI = 25: 178.9  GEN: WDWN, NAD, Non-toxic, A & O x 3 HEENT: Atraumatic, Normocephalic. Neck supple. No masses, No LAD. Ears and Nose: No external deformity. CV: RRR, No M/G/R. No JVD. No thrill. No extra heart sounds. PULM: CTA B, no wheezes, crackles, rhonchi. No retractions. No resp. distress. No accessory muscle use. ABD: S, NT, ND, +BS. No rebound. No HSM. EXTR: No c/c/e NEURO Normal gait.  PSYCH: Normally interactive. Conversant. Not depressed or anxious appearing.  Calm demeanor.    Assessment and Plan: Sinusitis Bronchitis augmentin mucinex tussionex   Signed,  Phillips Odor, MD

## 2014-01-26 ENCOUNTER — Ambulatory Visit (INDEPENDENT_AMBULATORY_CARE_PROVIDER_SITE_OTHER): Payer: Medicare Other | Admitting: Family Medicine

## 2014-01-26 ENCOUNTER — Emergency Department (HOSPITAL_COMMUNITY)
Admission: EM | Admit: 2014-01-26 | Discharge: 2014-01-26 | Disposition: A | Discharge status: Home / Self Care | Payer: Medicare Other | Attending: Emergency Medicine | Admitting: Emergency Medicine

## 2014-01-26 ENCOUNTER — Emergency Department (HOSPITAL_COMMUNITY): Payer: Medicare Other

## 2014-01-26 ENCOUNTER — Encounter: Payer: Self-pay | Admitting: Family Medicine

## 2014-01-26 ENCOUNTER — Encounter (HOSPITAL_COMMUNITY): Payer: Self-pay | Admitting: Emergency Medicine

## 2014-01-26 VITALS — BP 128/80 | HR 66 | Temp 97.6°F | Resp 18

## 2014-01-26 DIAGNOSIS — M79609 Pain in unspecified limb: Secondary | ICD-10-CM | POA: Insufficient documentation

## 2014-01-26 DIAGNOSIS — G43909 Migraine, unspecified, not intractable, without status migrainosus: Secondary | ICD-10-CM | POA: Insufficient documentation

## 2014-01-26 DIAGNOSIS — Z8701 Personal history of pneumonia (recurrent): Secondary | ICD-10-CM | POA: Insufficient documentation

## 2014-01-26 DIAGNOSIS — I509 Heart failure, unspecified: Secondary | ICD-10-CM | POA: Insufficient documentation

## 2014-01-26 DIAGNOSIS — Z9889 Other specified postprocedural states: Secondary | ICD-10-CM | POA: Insufficient documentation

## 2014-01-26 DIAGNOSIS — Z8711 Personal history of peptic ulcer disease: Secondary | ICD-10-CM | POA: Insufficient documentation

## 2014-01-26 DIAGNOSIS — M5412 Radiculopathy, cervical region: Secondary | ICD-10-CM | POA: Insufficient documentation

## 2014-01-26 DIAGNOSIS — M25529 Pain in unspecified elbow: Secondary | ICD-10-CM

## 2014-01-26 DIAGNOSIS — R609 Edema, unspecified: Secondary | ICD-10-CM | POA: Insufficient documentation

## 2014-01-26 DIAGNOSIS — F3289 Other specified depressive episodes: Secondary | ICD-10-CM | POA: Insufficient documentation

## 2014-01-26 DIAGNOSIS — Z860100 Personal history of colon polyps, unspecified: Secondary | ICD-10-CM | POA: Insufficient documentation

## 2014-01-26 DIAGNOSIS — Z791 Long term (current) use of non-steroidal anti-inflammatories (NSAID): Secondary | ICD-10-CM | POA: Insufficient documentation

## 2014-01-26 DIAGNOSIS — Z8619 Personal history of other infectious and parasitic diseases: Secondary | ICD-10-CM | POA: Insufficient documentation

## 2014-01-26 DIAGNOSIS — R0789 Other chest pain: Secondary | ICD-10-CM

## 2014-01-26 DIAGNOSIS — Z8719 Personal history of other diseases of the digestive system: Secondary | ICD-10-CM | POA: Insufficient documentation

## 2014-01-26 DIAGNOSIS — I252 Old myocardial infarction: Secondary | ICD-10-CM | POA: Insufficient documentation

## 2014-01-26 DIAGNOSIS — J45909 Unspecified asthma, uncomplicated: Secondary | ICD-10-CM | POA: Insufficient documentation

## 2014-01-26 DIAGNOSIS — Z8673 Personal history of transient ischemic attack (TIA), and cerebral infarction without residual deficits: Secondary | ICD-10-CM | POA: Insufficient documentation

## 2014-01-26 DIAGNOSIS — Z79899 Other long term (current) drug therapy: Secondary | ICD-10-CM | POA: Insufficient documentation

## 2014-01-26 DIAGNOSIS — IMO0002 Reserved for concepts with insufficient information to code with codable children: Secondary | ICD-10-CM | POA: Insufficient documentation

## 2014-01-26 DIAGNOSIS — F329 Major depressive disorder, single episode, unspecified: Secondary | ICD-10-CM | POA: Insufficient documentation

## 2014-01-26 DIAGNOSIS — Z87448 Personal history of other diseases of urinary system: Secondary | ICD-10-CM | POA: Insufficient documentation

## 2014-01-26 DIAGNOSIS — M79603 Pain in arm, unspecified: Secondary | ICD-10-CM

## 2014-01-26 DIAGNOSIS — R079 Chest pain, unspecified: Secondary | ICD-10-CM

## 2014-01-26 DIAGNOSIS — Z96659 Presence of unspecified artificial knee joint: Secondary | ICD-10-CM | POA: Insufficient documentation

## 2014-01-26 DIAGNOSIS — G40909 Epilepsy, unspecified, not intractable, without status epilepticus: Secondary | ICD-10-CM | POA: Insufficient documentation

## 2014-01-26 LAB — I-STAT TROPONIN, ED
Troponin i, poc: 0.01 ng/mL (ref 0.00–0.08)
Troponin i, poc: 0 ng/mL (ref 0.00–0.08)

## 2014-01-26 LAB — CBC
HEMATOCRIT: 44.2 % (ref 39.0–52.0)
Hemoglobin: 15.3 g/dL (ref 13.0–17.0)
MCH: 31.6 pg (ref 26.0–34.0)
MCHC: 34.6 g/dL (ref 30.0–36.0)
MCV: 91.3 fL (ref 78.0–100.0)
Platelets: 185 10*3/uL (ref 150–400)
RBC: 4.84 MIL/uL (ref 4.22–5.81)
RDW: 13.3 % (ref 11.5–15.5)
WBC: 8.8 10*3/uL (ref 4.0–10.5)

## 2014-01-26 LAB — BASIC METABOLIC PANEL
BUN: 20 mg/dL (ref 6–23)
CHLORIDE: 104 meq/L (ref 96–112)
CO2: 19 meq/L (ref 19–32)
CREATININE: 1.12 mg/dL (ref 0.50–1.35)
Calcium: 8.5 mg/dL (ref 8.4–10.5)
GFR calc Af Amer: 79 mL/min — ABNORMAL LOW (ref >90)
GFR calc non Af Amer: 68 mL/min — ABNORMAL LOW (ref >90)
Glucose, Bld: 88 mg/dL (ref 70–99)
Potassium: 4.2 mEq/L (ref 3.7–5.3)
Sodium: 138 mEq/L (ref 137–147)

## 2014-01-26 LAB — PRO B NATRIURETIC PEPTIDE: Pro B Natriuretic peptide (BNP): 65.6 pg/mL (ref 0–125)

## 2014-01-26 MED ORDER — MORPHINE SULFATE 4 MG/ML IJ SOLN
4.0000 mg | Freq: Once | INTRAMUSCULAR | Status: AC
  Administered 2014-01-26: 4 mg via INTRAVENOUS
  Filled 2014-01-26: qty 1

## 2014-01-26 MED ORDER — ONDANSETRON HCL 4 MG/2ML IJ SOLN
4.0000 mg | Freq: Once | INTRAMUSCULAR | Status: AC
  Administered 2014-01-26: 4 mg via INTRAVENOUS
  Filled 2014-01-26: qty 2

## 2014-01-26 NOTE — ED Provider Notes (Signed)
CSN: [id number]     Arrival date & time 01/26/14  1301 History   First MD Initiated Contact with Patient 01/26/14 1309     Chief Complaint  Patient presents with  . Chest Pain     (Consider location/radiation/quality/duration/timing/severity/associated sxs/prior Treatment) HPI Comments: Patient with ?h/o MI, CHF -- presents with complaint of chest pain. Patient awoke at approximately 3 AM with right arm pain that was worse with movement. This is different than his normal extremity pain. Patient went to the urgent care this morning. Approximately 10 AM patient developed mid sternal chest pain described as a tightness. He was sent to the emergency department for further evaluation. Patient denies shortness of breath, diaphoresis, palpitations, nausea. Nothing made the chest pain better or worse. Patient was given baby aspirin and one sublingual NTG without improvement. He denies lower extremity swelling. Onset of symptoms acute. Course is constant.  The history is provided by the patient and medical records.    Past Medical History  Diagnosis Date  . Blood transfusion   . GERD (gastroesophageal reflux disease)   . History of stomach ulcers   . Lower GI bleeding   . Migraine     "definitely"  . Depression   . Acute kidney failure 08/2008  . Pneumonia 08/2008    "while in ICU"  . CLOSTRIDIUM DIFFICILE COLITIS 07/04/2010    Annotation: 12/09, 2/10 Qualifier: Diagnosis of  By: [name], [name]    . Personal history of colonic adenoma 06/01/2003  . CHF (congestive heart failure)   . Seizures   . Asthma   . Heart attack     2009 personal history of 3  . Stroke    Past Surgical History  Procedure Laterality Date  . Joint replacement    . Knee fusion  03/2009    right knee removal; antibiotic spacers;   . Anterior  nerve transposition  07/2009    left ulnar nerve  . Toe amputation  08/2008    left great toe  . Antibiotic spacer exchange  11/2008; 08/2006    right knee  .  Replacement total knee  08/2008; 09/2001    right  . Arthrotomy  08/2008    right knee w/I&D  . Hardware removal  04/2006    right knee w/antibiotic spacers placed  . Synovectomy  06/2005    debridement, liner exchange right knee  . Knee arthroscopy  10/2001    right  . Inguinal hernia repair  early 1990's    bilateral   Family History  Problem Relation Age of Onset  . Coronary artery disease Father   . Heart disease Mother   . Colon cancer Neg Hx   . Stomach cancer Neg Hx   . Cancer Sister     breast  . Diabetes Brother   . Diabetes Sister    History  Substance Use Topics  . Smoking status: Never Smoker   . Smokeless tobacco: Never Used  . Alcohol Use: Yes     Comment: "I abused alcohol; last drink  ~ 2005"    Review of Systems  Constitutional: Negative for fever and diaphoresis.  Eyes: Negative for redness.  Respiratory: Negative for cough and shortness of breath.   Cardiovascular: Positive for chest pain. Negative for palpitations and leg swelling.  Gastrointestinal: Negative for nausea, vomiting and abdominal pain.  Genitourinary: Negative for dysuria.  Musculoskeletal: Positive for myalgias. Negative for back pain and neck pain.  Skin: Negative for rash.  Neurological: Negative for syncope  and light-headedness.    Allergies  Peanut-containing drug products; Percocet; and Nsaids  Home Medications   Prior to Admission medications   Medication Sig Start Date End Date Taking? Authorizing Provider  Celecoxib (CELEBREX PO) Take 1 capsule by mouth daily.    Historical Provider, MD  fluticasone (FLONASE) 50 MCG/ACT nasal spray Place 2 sprays into the nose daily. 08/02/13   Ellison Carwin, MD  GABAPENTIN, PHN, PO Take 1 capsule by mouth daily.    Historical Provider, MD  Topiramate (TOPAMAX PO) Take 1 tablet by mouth daily.    Historical Provider, MD   BP 121/69  Pulse 66  Temp(Src) 98.3 F (36.8 C) (Oral)  SpO2 97%  Physical Exam  Nursing note and vitals  reviewed. Constitutional: He appears well-developed and well-nourished.  HENT:  Head: Normocephalic and atraumatic.  Mouth/Throat: Mucous membranes are normal. Mucous membranes are not dry.  Eyes: Conjunctivae are normal.  Neck: Trachea normal and normal range of motion. Neck supple. Normal carotid pulses and no JVD present. No muscular tenderness present. Carotid bruit is not present. No tracheal deviation present.  Cardiovascular: Normal rate, regular rhythm, S1 normal, S2 normal, normal heart sounds and intact distal pulses.  Exam reveals no distant heart sounds and no decreased pulses.   No murmur heard. Pulses:      Radial pulses are 2+ on the right side, and 2+ on the left side.  Pulmonary/Chest: Effort normal and breath sounds normal. No respiratory distress. He has no wheezes. He exhibits no tenderness.  Abdominal: Soft. Normal aorta and bowel sounds are normal. There is no tenderness. There is no rebound and no guarding.  Musculoskeletal: He exhibits edema.       Right shoulder: He exhibits decreased range of motion (pain with movement) and tenderness. He exhibits no spasm, normal pulse and normal strength.       Cervical back: Normal.  1+ edema bilateral lower extremities  Neurological: He is alert.  Skin: Skin is warm and dry. He is not diaphoretic. No cyanosis. No pallor.  Psychiatric: He has a normal mood and affect.    ED Course  Procedures (including critical care time) Labs Review Labs Reviewed  BASIC METABOLIC PANEL - Abnormal; Notable for the following:    GFR calc non Af Amer 68 (*)    GFR calc Af Amer 79 (*)    All other components within normal limits  CBC  PRO B NATRIURETIC PEPTIDE  I-STAT TROPOININ, ED  I-STAT TROPOININ, ED    Imaging Review Dg Chest Port 1 View  01/26/2014   CLINICAL DATA:  Data chest pain and chest tightness with no known cardiopulmonary disease  EXAM: PORTABLE CHEST - 1 VIEW  COMPARISON:  DG CHEST 2 VIEW dated 08/26/2012  FINDINGS: The  lungs are adequately inflated. There is no focal infiltrate. The cardiac silhouette is top-normal in size. The pulmonary vascularity is not engorged. The mediastinum is normal in width. There is no significant pleural effusion. There is no pneumothorax. The observed portions of the bony thorax appear normal.  IMPRESSION: There is no evidence of pneumonia nor CHF. Minimal prominence of lung markings on the right just above the hemidiaphragm is noted but these are stable.   Electronically Signed   By: David  Martinique   On: 01/26/2014 14:20     EKG Interpretation   Date/Time:  Tuesday January 26 2014 13:11:40 EDT Ventricular Rate:  57 PR Interval:  154 QRS Duration: 92 QT Interval:  425 QTC Calculation: 414 R Axis:  80 Text Interpretation:  Sinus rhythm Low voltage, precordial leads Consider  anterior infarct      Patient seen and examined. Work-up initiated. Medications ordered.   Vital signs reviewed and are as follows: Filed Vitals:   01/26/14 1315  BP: 121/69  Pulse: 66  Temp: 98.3 F (36.8 C)   4:08 PM D/w Dr. Eulis Foster. Pain worsening. Additional morphine ordered. 2nd marker pending 4:45PM.   2nd marker was neg. Pt informed. Pain controlled at discharge. Patient seen by Dr. Eulis Foster. Exam and history is atypical for ACS. Pt is low risk for ACS. Patient has PCP appt tomorrow. Feel safe for d/c to home.   Patient was counseled to return with severe chest pain, especially if the pain is crushing or pressure-like and spreads to the arms, back, neck, or jaw, or if they have sweating, nausea, or shortness of breath with the pain. They were encouraged to call 911 with these symptoms.   They were also told to return if their chest pain gets worse and does not go away with rest, they have an attack of chest pain lasting longer than usual despite rest and treatment with the medications their caregiver has prescribed, if they wake from sleep with chest pain or shortness of breath, if they feel dizzy  or faint, if they have chest pain not typical of their usual pain, or if they have any other emergent concerns regarding their health.  The patient verbalized understanding and agreed.   MDM   Final diagnoses:  Chest pain  Arm pain   Patient with chest tightness. Feel patient is low risk for ACS given history (poor story for ACS/MI), negative troponin(s), normal/unchanged EKG. No history concerning for PE and do not feel chest CT or d-dimer is indicated. Do not suspect aortic aneurysm or dissection given this presentation. Equal UE pulses. Patient appears well, comfortable. Pain controlled.   Arm pain: right pain is worse with movement. He has history of cervical radiculopathy. This is likely flare of same. Arm is neurovascularly intact.   No dangerous or life-threatening conditions suspected or identified by history, physical exam, and by work-up. No indications for hospitalization identified.    Carlisle Cater, PA-C 01/26/14 [phone]

## 2014-01-26 NOTE — ED Notes (Signed)
Pt presents to ED from [facility] via [facility] with central chest pain radiating down Right arm/hand starting this am. Pt states he may have slept on his Right arm wrong last night and was moving heavy boxes all day yesterday. States he has a hx of nerve damage to bilateral arms. Pt denies SOB, diaphoresis, cough, congestion, abd/back pain

## 2014-01-26 NOTE — ED Provider Notes (Addendum)
  Face-to-face evaluation   History: He has had right neck, right arm, and right upper chest pain, today. No recent trauma. He has a history of degenerative joint disease of the cervical spine  Physical exam: Alert, calm, cooperative. He is limited, right arm motion, secondary to pain in the shoulder with movement. Respiratory, normal chest region with inspiration   EKG Interpretation  Date/Time:  Tuesday January 26 2014 16:19:35 EDT Ventricular Rate:  71 PR Interval:  146 QRS Duration: 93 QT Interval:  404 QTC Calculation: 439 R Axis:   116 Text Interpretation:  Sinus rhythm Left posterior fascicular block Low voltage, precordial leads Consider anterior infarct since last tracing no significant change Confirmed by Eulis Foster  MD, Vira Agar ([id number]) on 01/26/2014 8:56:33 PM        Medical screening examination/treatment/procedure(s) were conducted as a shared visit with non-physician practitioner(s) and myself.  I personally evaluated the patient during the encounter  Richarda Blade, MD 01/26/14 [place], MD 01/26/14 [phone]

## 2014-01-26 NOTE — ED Notes (Signed)
Awoke at 3am with chest discomfort went to Urgent care and transported her per [facility].

## 2014-01-26 NOTE — Progress Notes (Signed)
° °  Subjective:  This chart was scribed for Nicholas Haber, MD  by Stacy Gardner, [facility] Scribe. The patient was seen in room and the patient's care was started at 12:08 PM.   Patient ID: Nicholas [name], male    DOB: [birth year], 63 y.o.   MRN: [id number] Chief Complaint  Patient presents with   Chest Pain    chest tightness, tingling down right arm and hand    Chest Pain  Pertinent negatives include no diaphoresis, nausea, shortness of breath or vomiting.   HPI Comments: [name] is a 63 y.o. male who arrives to the [facility] complaining of severe arm pain onset this morning after waking.He thinks he may have slept on his right arm abnormally. Pt has associated chest tightness. Denies new VI  (visual impairment), nausea, diaphoresis and SOB. Denies abdominal pain. He has a R leg swelling but reports this is due to having a steel rod.  Pt has a hx of VI from a prior injury. Pt explains having a prior nerve damage and having arm pain in the past. Denies smoking tobacco. Denies hx of HTN, hypercholesteremia, and DM. Pt was working outside and carrying heavy boxes yesterday. Denies doing any strenuous work today. Pt has minor ecchymosis  to his R forearm. He does not take Asprin.  Pt's father passed from a massive MI.   Review of Systems  Constitutional: Negative for diaphoresis.  Eyes: Positive for visual disturbance.  Respiratory: Negative for shortness of breath.   Cardiovascular: Positive for chest pain and leg swelling.  Gastrointestinal: Negative for nausea and vomiting.       Objective:   Physical Exam  Constitutional: He is oriented to person, place, and time. He appears well-developed and well-nourished. No distress.  HENT:  Head: Normocephalic and atraumatic.  Neck: Neck supple. No JVD present. No thyromegaly present.  Cardiovascular: Normal rate, regular rhythm and normal heart sounds.   No murmur heard. Pulmonary/Chest:  Effort normal and breath sounds normal. He has no wheezes. He has no rales.  Chest line oscillation    Abdominal: Soft. There is no tenderness.  Musculoskeletal: He exhibits edema (trace) and tenderness.  Tenderness to right shoulder joint line Unable to raise R arm due to pain  Neurological: He is alert and oriented to person, place, and time.  Skin: Skin is warm. No rash noted. He is not diaphoretic.  ecchymosis of right forearm Clear of rash or suspicious lesion   Psychiatric: He has a normal mood and affect. His behavior is normal. Thought content normal.        Assessment & Plan:  Pt was given three asprins to chew and was put on a oxygen around 3:20.   IV started.  Emergency transfer to cardiology at [facility]  Chest tightness - Plan: EKG 12-Lead  Pain in joint, upper arm  Signed, Nicholas Haber, MD

## 2014-01-26 NOTE — ED Notes (Signed)
EDPA Gieple at bedside.

## 2014-01-26 NOTE — Discharge Instructions (Signed)
Please read and follow all provided instructions.  Your diagnoses today include:  1. Chest pain   2. Arm pain     Tests performed today include:  An EKG of your heart  A chest x-ray  Cardiac enzymes (2) - a blood test for heart muscle damage, both were normal  Blood counts and electrolytes  Vital signs. See below for your results today.   Medications prescribed:   None  Take any prescribed medications only as directed.  Follow-up instructions: Please follow-up with your primary care provider as soon as you can for further evaluation of your symptoms. If you do not have a primary care doctor -- see below for referral information.   Return instructions:  SEEK IMMEDIATE MEDICAL ATTENTION IF:  You have severe chest pain, especially if the pain is crushing or pressure-like and spreads to the arms, back, neck, or jaw, or if you have sweating, nausea (feeling sick to your stomach), or shortness of breath. THIS IS AN EMERGENCY. Don't wait to see if the pain will go away. Get medical help at once. Call 911 or 0 (operator). DO NOT drive yourself to the hospital.   Your chest pain gets worse and does not go away with rest.   You have an attack of chest pain lasting longer than usual, despite rest and treatment with the medications your caregiver has prescribed.   You wake from sleep with chest pain or shortness of breath.  You feel dizzy or faint.  You have chest pain not typical of your usual pain for which you originally saw your caregiver.   You have any other emergent concerns regarding your health.  Additional Information: Chest pain comes from many different causes. Your caregiver has diagnosed you as having chest pain that is not specific for one problem, but does not require admission.  You are at low risk for an acute heart condition or other serious illness.   Your vital signs today were: BP 119/67   Pulse 70   Temp(Src) 98.3 F (36.8 C) (Oral)   Resp 18   SpO2  98% If your blood pressure (BP) was elevated above 135/85 this visit, please have this repeated by your doctor within one month. --------------

## 2014-01-26 NOTE — ED Notes (Signed)
Received 4 baby aspirin and one sl nitro

## 2015-04-29 ENCOUNTER — Emergency Department (HOSPITAL_COMMUNITY)
Admission: EM | Admit: 2015-04-29 | Discharge: 2015-04-29 | Disposition: A | Discharge status: Home / Self Care | Payer: Medicare Other | Attending: Emergency Medicine | Admitting: Emergency Medicine

## 2015-04-29 ENCOUNTER — Encounter (HOSPITAL_COMMUNITY): Payer: Self-pay | Admitting: Emergency Medicine

## 2015-04-29 DIAGNOSIS — Z8619 Personal history of other infectious and parasitic diseases: Secondary | ICD-10-CM | POA: Diagnosis not present

## 2015-04-29 DIAGNOSIS — R51 Headache: Secondary | ICD-10-CM | POA: Diagnosis present

## 2015-04-29 DIAGNOSIS — J45909 Unspecified asthma, uncomplicated: Secondary | ICD-10-CM | POA: Insufficient documentation

## 2015-04-29 DIAGNOSIS — F329 Major depressive disorder, single episode, unspecified: Secondary | ICD-10-CM | POA: Diagnosis not present

## 2015-04-29 DIAGNOSIS — G40909 Epilepsy, unspecified, not intractable, without status epilepticus: Secondary | ICD-10-CM | POA: Diagnosis not present

## 2015-04-29 DIAGNOSIS — H53149 Visual discomfort, unspecified: Secondary | ICD-10-CM | POA: Diagnosis not present

## 2015-04-29 DIAGNOSIS — I252 Old myocardial infarction: Secondary | ICD-10-CM | POA: Diagnosis not present

## 2015-04-29 DIAGNOSIS — Z79899 Other long term (current) drug therapy: Secondary | ICD-10-CM | POA: Diagnosis not present

## 2015-04-29 DIAGNOSIS — Z8701 Personal history of pneumonia (recurrent): Secondary | ICD-10-CM | POA: Diagnosis not present

## 2015-04-29 DIAGNOSIS — Z87448 Personal history of other diseases of urinary system: Secondary | ICD-10-CM | POA: Diagnosis not present

## 2015-04-29 DIAGNOSIS — G43109 Migraine with aura, not intractable, without status migrainosus: Secondary | ICD-10-CM | POA: Insufficient documentation

## 2015-04-29 DIAGNOSIS — Z86010 Personal history of colonic polyps: Secondary | ICD-10-CM | POA: Diagnosis not present

## 2015-04-29 DIAGNOSIS — F439 Reaction to severe stress, unspecified: Secondary | ICD-10-CM

## 2015-04-29 DIAGNOSIS — G43009 Migraine without aura, not intractable, without status migrainosus: Secondary | ICD-10-CM

## 2015-04-29 DIAGNOSIS — Z8719 Personal history of other diseases of the digestive system: Secondary | ICD-10-CM | POA: Diagnosis not present

## 2015-04-29 DIAGNOSIS — I509 Heart failure, unspecified: Secondary | ICD-10-CM | POA: Diagnosis not present

## 2015-04-29 DIAGNOSIS — Z8673 Personal history of transient ischemic attack (TIA), and cerebral infarction without residual deficits: Secondary | ICD-10-CM | POA: Diagnosis not present

## 2015-04-29 MED ORDER — DIPHENHYDRAMINE HCL 50 MG/ML IJ SOLN
25.0000 mg | Freq: Once | INTRAMUSCULAR | Status: AC
  Administered 2015-04-29: 25 mg via INTRAVENOUS
  Filled 2015-04-29: qty 1

## 2015-04-29 MED ORDER — KETOROLAC TROMETHAMINE 30 MG/ML IJ SOLN
30.0000 mg | Freq: Once | INTRAMUSCULAR | Status: AC
  Administered 2015-04-29: 30 mg via INTRAVENOUS
  Filled 2015-04-29: qty 1

## 2015-04-29 MED ORDER — METOCLOPRAMIDE HCL 5 MG/ML IJ SOLN
10.0000 mg | Freq: Once | INTRAMUSCULAR | Status: AC
  Administered 2015-04-29: 10 mg via INTRAVENOUS
  Filled 2015-04-29: qty 2

## 2015-04-29 MED ORDER — SODIUM CHLORIDE 0.9 % IV BOLUS (SEPSIS)
1000.0000 mL | Freq: Once | INTRAVENOUS | Status: AC
  Administered 2015-04-29: 1000 mL via INTRAVENOUS

## 2015-04-29 NOTE — Discharge Instructions (Signed)

## 2015-04-29 NOTE — ED Notes (Signed)
Pt. reports migraine ( left temporal) headache for 2 days , denies nausea or vomitting / no photophobia or fever .

## 2015-04-29 NOTE — ED Provider Notes (Signed)
CSN: [id number]     Arrival date & time 04/29/15  1837 History   First MD Initiated Contact with Patient 04/29/15 2002     Chief Complaint  Patient presents with  . Headache   (Consider location/radiation/quality/duration/timing/severity/associated sxs/prior Treatment) Patient is a 64 y.o. male presenting with headaches. The history is provided by the patient. No language interpreter was used.  Headache Pain location:  Frontal Quality:  Stabbing Radiates to:  Does not radiate Severity currently:  7/10 Onset quality:  Gradual Timing:  Constant Progression:  Unchanged Chronicity:  Recurrent Similar to prior headaches: yes   Context: emotional stress   Relieved by:  Nothing Worsened by:  Nothing Ineffective treatments:  NSAIDs Associated symptoms: photophobia   Associated symptoms: no abdominal pain, no back pain, no blurred vision, no congestion, no cough, no diarrhea, no fatigue, no fever, no focal weakness, no loss of balance, no myalgias, no nausea, no near-syncope, no neck pain, no neck stiffness, no numbness, no syncope, no URI, no visual change, no vomiting and no weakness     Past Medical History  Diagnosis Date  . Blood transfusion   . GERD (gastroesophageal reflux disease)   . History of stomach ulcers   . Lower GI bleeding   . Migraine     "definitely"  . Depression   . Acute kidney failure 08/2008  . Pneumonia 08/2008    "while in ICU"  . CLOSTRIDIUM DIFFICILE COLITIS 07/04/2010    Annotation: 12/09, 2/10 Qualifier: Diagnosis of  By: [name], [name]    . Personal history of colonic adenoma 06/01/2003  . CHF (congestive heart failure)   . Seizures   . Asthma   . Heart attack     2009 personal history of 3  . Stroke    Past Surgical History  Procedure Laterality Date  . Joint replacement    . Knee fusion  03/2009    right knee removal; antibiotic spacers;   . Anterior  nerve transposition  07/2009    left ulnar nerve  . Toe amputation  08/2008    left  great toe  . Antibiotic spacer exchange  11/2008; 08/2006    right knee  . Replacement total knee  08/2008; 09/2001    right  . Arthrotomy  08/2008    right knee w/I&D  . Hardware removal  04/2006    right knee w/antibiotic spacers placed  . Synovectomy  06/2005    debridement, liner exchange right knee  . Knee arthroscopy  10/2001    right  . Inguinal hernia repair  early 1990's    bilateral   Family History  Problem Relation Age of Onset  . Coronary artery disease Father   . Heart disease Mother   . Colon cancer Neg Hx   . Stomach cancer Neg Hx   . Cancer Sister     breast  . Diabetes Brother   . Diabetes Sister    History  Substance Use Topics  . Smoking status: Never Smoker   . Smokeless tobacco: Never Used  . Alcohol Use: Yes     Comment: "I abused alcohol; last drink  ~ 2005"    Review of Systems  Constitutional: Negative for fever and fatigue.  HENT: Negative for congestion.   Eyes: Positive for photophobia. Negative for blurred vision.  Respiratory: Negative for cough, chest tightness and shortness of breath.   Cardiovascular: Negative for chest pain, syncope and near-syncope.  Gastrointestinal: Negative for nausea, vomiting, abdominal pain and diarrhea.  Musculoskeletal: Negative for myalgias, back pain, neck pain and neck stiffness.  Neurological: Positive for headaches. Negative for focal weakness, facial asymmetry, speech difficulty, weakness, light-headedness, numbness and loss of balance.  Psychiatric/Behavioral: Negative for confusion.  All other systems reviewed and are negative.     Allergies  Peanut-containing drug products; Percocet; and Nsaids  Home Medications   Prior to Admission medications   Medication Sig Start Date End Date Taking? Authorizing Provider  celecoxib (CELEBREX) 100 MG capsule Take 100 mg by mouth daily.    Historical Provider, MD  furosemide (LASIX) 20 MG tablet Take 20 mg by mouth daily.    Historical Provider, MD   gabapentin (NEURONTIN) 300 MG capsule Take 300 mg by mouth daily.    Historical Provider, MD  naproxen sodium (ANAPROX) 220 MG tablet Take 220 mg by mouth 2 (two) times daily as needed (for pain).     Historical Provider, MD  topiramate (TOPAMAX) 25 MG tablet Take 25 mg by mouth daily.    Historical Provider, MD   BP 125/78 mmHg  Pulse 66  Temp(Src) 98.1 F (36.7 C) (Oral)  Resp 16  Ht 6' (1.829 m)  Wt 218 lb (98.884 kg)  BMI 29.56 kg/m2  SpO2 95%   Physical Exam  Constitutional: He is oriented to person, place, and time. He appears well-developed and well-nourished. No distress.  HENT:  Head: Normocephalic and atraumatic.  Nose: Nose normal.  Mouth/Throat: Oropharynx is clear and moist. No oropharyngeal exudate.  Tender to palpation at left temporal area, right frontal sinus, and over right eyebrow.  No gross deformities, no skin changes  Eyes: EOM are normal. Pupils are equal, round, and reactive to light.  EOMI  Neck: Normal range of motion. Neck supple.  Cardiovascular: Normal rate, regular rhythm, normal heart sounds and intact distal pulses.   No murmur heard. Pulmonary/Chest: Effort normal and breath sounds normal. No respiratory distress. He has no wheezes. He exhibits no tenderness.  Abdominal: Soft. He exhibits no distension. There is no tenderness. There is no guarding.  Musculoskeletal: Normal range of motion. He exhibits no tenderness.  Neurological: He is alert and oriented to person, place, and time. No cranial nerve deficit. Coordination normal.  Skin: Skin is warm and dry. He is not diaphoretic. No pallor.  Psychiatric: He has a normal mood and affect. His behavior is normal. Judgment and thought content normal.  Nursing note and vitals reviewed.   ED Course  Procedures (including critical care time) Labs Review Labs Reviewed - No data to display  Imaging Review No results found.   EKG Interpretation None      MDM   Final diagnoses:  Migraine  without aura and without status migrainosus, not intractable  Stress at home   Pt is a 64 yo M with hx of chronic pain and migraines who presents with a headache.  Complains of 2 days of gradual throbbing headache behind bilateral eyes.  Has a hx of right face trauma several years ago with a maxillary plate, with chronic pain at that area.  Also has chronic migraines.  Previously took triptans, now just on topamax and advil.  Has had a bilateral frontal headache that radiates down behind both eyes for 2 days now, not improved with advil this morning.  Reports that his headaches usually increase with stress and he has been very emotionally stressed recently with her wife being diagnosed with ESRD and starting dialysis recently.   Denies fever, neck pain, or other illness sx.  No recent falls.  No weakness, numbness, confusion, or speech deficits.   Likely migraine, so will treat with cocktail.  Given toradol, reglan, benadryl, and NS bolus.    Pain improved significantly with pain control.  Considered appropriate for dc home.  All questions were answered and ED return precautions were discussed prior to dc home.    Patient was seen with ED Attending, Dr. Elisabeth Most, MD    Tori Milks, MD 04/30/15 0001  Malvin Johns, MD 04/30/15 [phone]

## 2015-04-29 NOTE — ED Notes (Addendum)
Patient reports that he is experiencing sharp, constant pain behind both eyes for which he has taken Tylenol 1000 mg without relief. Reports pain began 2 days ago, and also reports that he experiences migraine headaches. Denies any recent injury or LOC. Patient speaks of stress that he is undergoing in his life, and believes it is related.

## 2015-11-14 DIAGNOSIS — F3341 Major depressive disorder, recurrent, in partial remission: Secondary | ICD-10-CM | POA: Diagnosis not present

## 2015-12-05 DIAGNOSIS — F339 Major depressive disorder, recurrent, unspecified: Secondary | ICD-10-CM | POA: Diagnosis not present

## 2015-12-05 DIAGNOSIS — M25551 Pain in right hip: Secondary | ICD-10-CM | POA: Diagnosis not present

## 2015-12-05 DIAGNOSIS — R42 Dizziness and giddiness: Secondary | ICD-10-CM | POA: Diagnosis not present

## 2015-12-05 DIAGNOSIS — D649 Anemia, unspecified: Secondary | ICD-10-CM | POA: Diagnosis not present

## 2015-12-05 DIAGNOSIS — F4320 Adjustment disorder, unspecified: Secondary | ICD-10-CM | POA: Diagnosis not present

## 2015-12-05 DIAGNOSIS — E785 Hyperlipidemia, unspecified: Secondary | ICD-10-CM | POA: Diagnosis not present

## 2015-12-06 DIAGNOSIS — D649 Anemia, unspecified: Secondary | ICD-10-CM | POA: Diagnosis not present

## 2015-12-08 DIAGNOSIS — G5783 Other specified mononeuropathies of bilateral lower limbs: Secondary | ICD-10-CM | POA: Diagnosis not present

## 2015-12-08 DIAGNOSIS — G43919 Migraine, unspecified, intractable, without status migrainosus: Secondary | ICD-10-CM | POA: Diagnosis not present

## 2015-12-08 DIAGNOSIS — Z0000 Encounter for general adult medical examination without abnormal findings: Secondary | ICD-10-CM | POA: Diagnosis not present

## 2015-12-08 DIAGNOSIS — Z6829 Body mass index (BMI) 29.0-29.9, adult: Secondary | ICD-10-CM | POA: Diagnosis not present

## 2015-12-08 DIAGNOSIS — F3341 Major depressive disorder, recurrent, in partial remission: Secondary | ICD-10-CM | POA: Diagnosis not present

## 2015-12-08 DIAGNOSIS — G4089 Other seizures: Secondary | ICD-10-CM | POA: Diagnosis not present

## 2015-12-13 DIAGNOSIS — R21 Rash and other nonspecific skin eruption: Secondary | ICD-10-CM | POA: Diagnosis not present

## 2015-12-13 DIAGNOSIS — M79604 Pain in right leg: Secondary | ICD-10-CM | POA: Diagnosis not present

## 2015-12-13 DIAGNOSIS — F4321 Adjustment disorder with depressed mood: Secondary | ICD-10-CM | POA: Diagnosis not present

## 2016-01-10 DIAGNOSIS — F4321 Adjustment disorder with depressed mood: Secondary | ICD-10-CM | POA: Insufficient documentation

## 2016-01-10 DIAGNOSIS — M1611 Unilateral primary osteoarthritis, right hip: Secondary | ICD-10-CM | POA: Diagnosis not present

## 2016-01-10 DIAGNOSIS — F4320 Adjustment disorder, unspecified: Secondary | ICD-10-CM | POA: Diagnosis not present

## 2016-01-29 ENCOUNTER — Emergency Department (HOSPITAL_COMMUNITY)
Admission: EM | Admit: 2016-01-29 | Discharge: 2016-01-29 | Disposition: A | Discharge status: Home / Self Care | Payer: PPO | Attending: Emergency Medicine | Admitting: Emergency Medicine

## 2016-01-29 ENCOUNTER — Encounter (HOSPITAL_COMMUNITY): Payer: Self-pay | Admitting: Emergency Medicine

## 2016-01-29 ENCOUNTER — Emergency Department (HOSPITAL_COMMUNITY): Payer: PPO

## 2016-01-29 DIAGNOSIS — Z79899 Other long term (current) drug therapy: Secondary | ICD-10-CM | POA: Diagnosis not present

## 2016-01-29 DIAGNOSIS — K921 Melena: Secondary | ICD-10-CM | POA: Insufficient documentation

## 2016-01-29 DIAGNOSIS — Z8619 Personal history of other infectious and parasitic diseases: Secondary | ICD-10-CM | POA: Insufficient documentation

## 2016-01-29 DIAGNOSIS — I252 Old myocardial infarction: Secondary | ICD-10-CM | POA: Diagnosis not present

## 2016-01-29 DIAGNOSIS — Z8701 Personal history of pneumonia (recurrent): Secondary | ICD-10-CM | POA: Diagnosis not present

## 2016-01-29 DIAGNOSIS — Z91040 Latex allergy status: Secondary | ICD-10-CM | POA: Diagnosis not present

## 2016-01-29 DIAGNOSIS — R1011 Right upper quadrant pain: Secondary | ICD-10-CM | POA: Insufficient documentation

## 2016-01-29 DIAGNOSIS — Z86010 Personal history of colonic polyps: Secondary | ICD-10-CM | POA: Insufficient documentation

## 2016-01-29 DIAGNOSIS — I509 Heart failure, unspecified: Secondary | ICD-10-CM | POA: Diagnosis not present

## 2016-01-29 DIAGNOSIS — Z8673 Personal history of transient ischemic attack (TIA), and cerebral infarction without residual deficits: Secondary | ICD-10-CM | POA: Insufficient documentation

## 2016-01-29 DIAGNOSIS — F329 Major depressive disorder, single episode, unspecified: Secondary | ICD-10-CM | POA: Insufficient documentation

## 2016-01-29 DIAGNOSIS — J45909 Unspecified asthma, uncomplicated: Secondary | ICD-10-CM | POA: Diagnosis not present

## 2016-01-29 DIAGNOSIS — R109 Unspecified abdominal pain: Secondary | ICD-10-CM | POA: Diagnosis not present

## 2016-01-29 LAB — COMPREHENSIVE METABOLIC PANEL
ALT: 11 U/L — AB (ref 17–63)
AST: 20 U/L (ref 15–41)
Albumin: 3.7 g/dL (ref 3.5–5.0)
Alkaline Phosphatase: 72 U/L (ref 38–126)
Anion gap: 11 (ref 5–15)
BUN: 26 mg/dL — ABNORMAL HIGH (ref 6–20)
CO2: 20 mmol/L — AB (ref 22–32)
Calcium: 8.6 mg/dL — ABNORMAL LOW (ref 8.9–10.3)
Chloride: 106 mmol/L (ref 101–111)
Creatinine, Ser: 1.32 mg/dL — ABNORMAL HIGH (ref 0.61–1.24)
GFR, EST NON AFRICAN AMERICAN: 55 mL/min — AB (ref >60)
Glucose, Bld: 116 mg/dL — ABNORMAL HIGH (ref 65–99)
POTASSIUM: 3.6 mmol/L (ref 3.5–5.1)
SODIUM: 137 mmol/L (ref 135–145)
Total Bilirubin: 1.3 mg/dL — ABNORMAL HIGH (ref 0.3–1.2)
Total Protein: 6.8 g/dL (ref 6.5–8.1)

## 2016-01-29 LAB — URINALYSIS, ROUTINE W REFLEX MICROSCOPIC
Bilirubin Urine: NEGATIVE
Glucose, UA: NEGATIVE mg/dL
Hgb urine dipstick: NEGATIVE
Ketones, ur: NEGATIVE mg/dL
LEUKOCYTES UA: NEGATIVE
Nitrite: NEGATIVE
PROTEIN: NEGATIVE mg/dL
Specific Gravity, Urine: 1.02 (ref 1.005–1.030)
pH: 6 (ref 5.0–8.0)

## 2016-01-29 LAB — CBC WITH DIFFERENTIAL/PLATELET
BASOS ABS: 0 10*3/uL (ref 0.0–0.1)
Basophils Relative: 0 %
EOS ABS: 0.3 10*3/uL (ref 0.0–0.7)
Eosinophils Relative: 3 %
HCT: 37.7 % — ABNORMAL LOW (ref 39.0–52.0)
Hemoglobin: 12.4 g/dL — ABNORMAL LOW (ref 13.0–17.0)
Lymphocytes Relative: 9 %
Lymphs Abs: 1 10*3/uL (ref 0.7–4.0)
MCH: 29.3 pg (ref 26.0–34.0)
MCHC: 32.9 g/dL (ref 30.0–36.0)
MCV: 89.1 fL (ref 78.0–100.0)
MONO ABS: 1.6 10*3/uL — AB (ref 0.1–1.0)
Monocytes Relative: 15 %
Neutro Abs: 8 10*3/uL — ABNORMAL HIGH (ref 1.7–7.7)
Neutrophils Relative %: 73 %
Platelets: 288 10*3/uL (ref 150–400)
RBC: 4.23 MIL/uL (ref 4.22–5.81)
RDW: 13.5 % (ref 11.5–15.5)
WBC: 10.9 10*3/uL — AB (ref 4.0–10.5)

## 2016-01-29 LAB — LIPASE, BLOOD: LIPASE: 47 U/L (ref 11–51)

## 2016-01-29 LAB — POC OCCULT BLOOD, ED: FECAL OCCULT BLD: NEGATIVE

## 2016-01-29 MED ORDER — HYOSCYAMINE SULFATE 0.125 MG PO TABS
0.1250 mg | ORAL_TABLET | Freq: Once | ORAL | Status: AC
  Administered 2016-01-29: 0.125 mg via ORAL
  Filled 2016-01-29: qty 1

## 2016-01-29 MED ORDER — PANTOPRAZOLE SODIUM 20 MG PO TBEC: 20.0000 mg | DELAYED_RELEASE_TABLET | Freq: Two times a day (BID) | ORAL | Status: DC

## 2016-01-29 MED ORDER — TRAMADOL HCL 50 MG PO TABS: 50.0000 mg | ORAL_TABLET | Freq: Four times a day (QID) | ORAL | Status: DC | PRN

## 2016-01-29 MED ORDER — ONDANSETRON HCL 4 MG/2ML IJ SOLN
4.0000 mg | Freq: Once | INTRAMUSCULAR | Status: AC
  Administered 2016-01-29: 4 mg via INTRAVENOUS
  Filled 2016-01-29: qty 2

## 2016-01-29 MED ORDER — HYDROMORPHONE HCL 1 MG/ML IJ SOLN
1.0000 mg | Freq: Once | INTRAMUSCULAR | Status: AC
  Administered 2016-01-29: 1 mg via INTRAVENOUS
  Filled 2016-01-29: qty 1

## 2016-01-29 MED ORDER — IOPAMIDOL (ISOVUE-300) INJECTION 61%
INTRAVENOUS | Status: AC
Start: 2016-01-29 — End: 2016-01-29
  Administered 2016-01-29: 100 mL
  Filled 2016-01-29: qty 100

## 2016-01-29 MED ORDER — SODIUM CHLORIDE 0.9 % IV BOLUS (SEPSIS)
1000.0000 mL | Freq: Once | INTRAVENOUS | Status: AC
  Administered 2016-01-29: 1000 mL via INTRAVENOUS

## 2016-01-29 NOTE — ED Notes (Signed)
Pt taken to CT.

## 2016-01-29 NOTE — ED Notes (Signed)
Pt taken to US

## 2016-01-29 NOTE — ED Notes (Signed)
Pt in via [facility] with c/o sharp R sided abd pain and black, tarry stools x3 days. Pt began same s/s 1 mo ago and stopped taking Celebrex, bleeding stopped until this past Wednesday.

## 2016-01-29 NOTE — Discharge Instructions (Signed)

## 2016-01-29 NOTE — ED Provider Notes (Signed)
CSN: [id number]     Arrival date & time 01/29/16  [id number] History   First MD Initiated Contact with Patient 01/29/16 [phone]     Chief Complaint  Patient presents with  . Melena  . Abdominal Pain     Patient is a 65 y.o. male presenting with abdominal pain. The history is provided by the patient. No language interpreter was used.  Abdominal Pain  Nicholas [name] is a 65 y.o. male who presents to the Emergency Department complaining of abdominal pain.  He reports 3 days of severe right lower quadrant pain. Pain is sharp and stabbing in nature. He has associated nausea, no vomiting. No fevers or chills. He has black and tarry stools, last BM was 3 days ago. He has intermittent black and tarry stools since being on Celebrex. He also reports dysuria with orange colored urine. Symptoms are severe, constant, worsening.  Past Medical History  Diagnosis Date  . Blood transfusion   . GERD (gastroesophageal reflux disease)   . History of stomach ulcers   . Lower GI bleeding   . Migraine     "definitely"  . Depression   . Acute kidney failure 08/2008  . Pneumonia 08/2008    "while in ICU"  . CLOSTRIDIUM DIFFICILE COLITIS 07/04/2010    Annotation: 12/09, 2/10 Qualifier: Diagnosis of  By: [name], [name]    . Personal history of colonic adenoma 06/01/2003  . CHF (congestive heart failure) ([place])   . Seizures ([place])   . Asthma   . Heart attack [facility])     2009 personal history of 3  . Stroke [facility])    Past Surgical History  Procedure Laterality Date  . Joint replacement    . Knee fusion  03/2009    right knee removal; antibiotic spacers;   . Anterior  nerve transposition  07/2009    left ulnar nerve  . Toe amputation  08/2008    left great toe  . Antibiotic spacer exchange  11/2008; 08/2006    right knee  . Replacement total knee  08/2008; 09/2001    right  . Arthrotomy  08/2008    right knee w/I&D  . Hardware removal  04/2006    right knee w/antibiotic spacers placed  . Synovectomy  06/2005     debridement, liner exchange right knee  . Knee arthroscopy  10/2001    right  . Inguinal hernia repair  early 1990's    bilateral   Family History  Problem Relation Age of Onset  . Coronary artery disease Father   . Heart disease Mother   . Colon cancer Neg Hx   . Stomach cancer Neg Hx   . Cancer Sister     breast  . Diabetes Brother   . Diabetes Sister    Social History  Substance Use Topics  . Smoking status: Never Smoker   . Smokeless tobacco: Never Used  . Alcohol Use: Yes     Comment: "I abused alcohol; last drink  ~ 2005"    Review of Systems  Gastrointestinal: Positive for abdominal pain.  All other systems reviewed and are negative.     Allergies  Bee venom; Oxycodone-acetaminophen; Peanut-containing drug products; Percocet; Celebrex; Latex; and Nsaids  Home Medications   Prior to Admission medications   Medication Sig Start Date End Date Taking? Authorizing Provider  EPINEPHrine (EPIPEN JR) 0.15 MG/0.3ML injection Inject 0.15 mg into the muscle. 01/10/16 02/09/16 Yes Historical Provider, MD  FLUoxetine (PROZAC) 40 MG capsule Take 40  mg by mouth daily. 04/14/15  Yes Historical Provider, MD  gabapentin (NEURONTIN) 300 MG capsule Take 300 mg by mouth 2 (two) times daily.    Yes Historical Provider, MD  hydrocortisone 2.5 % cream Apply 1 application topically at bedtime as needed (for rash).  12/13/15  Yes Historical Provider, MD  naproxen sodium (ANAPROX) 220 MG tablet Take 220 mg by mouth 2 (two) times daily as needed (for pain).    Yes Historical Provider, MD  Probiotic Product (PROBIOTIC PO) Take 1 tablet by mouth daily.   Yes Historical Provider, MD  topiramate (TOPAMAX) 25 MG tablet Take 25 mg by mouth 2 (two) times daily.    Yes Historical Provider, MD  pantoprazole (PROTONIX) 20 MG tablet Take 1 tablet (20 mg total) by mouth 2 (two) times daily before a meal. 01/29/16   Quintella Reichert, MD  traMADol (ULTRAM) 50 MG tablet Take 1 tablet (50 mg total) by mouth  every 6 (six) hours as needed. 01/29/16   Quintella Reichert, MD   BP 121/75 mmHg  Pulse 58  Temp(Src) 97.7 F (36.5 C) (Oral)  Resp 22  SpO2 97% Physical Exam  Constitutional: He is oriented to person, place, and time. He appears well-developed and well-nourished.  HENT:  Head: Normocephalic and atraumatic.  Cardiovascular: Normal rate and regular rhythm.   No murmur heard. Pulmonary/Chest: Effort normal and breath sounds normal. No respiratory distress.  Abdominal: Soft.  Moderate right upper quadrant tenderness with voluntary guarding  Genitourinary:  Nontender rectal exam with empty rectal vault  Musculoskeletal: He exhibits no edema or tenderness.  Neurological: He is alert and oriented to person, place, and time.  Skin: Skin is warm and dry.  Psychiatric: He has a normal mood and affect. His behavior is normal.  Nursing note and vitals reviewed.   ED Course  Procedures (including critical care time) Labs Review Labs Reviewed  CBC WITH DIFFERENTIAL/PLATELET - Abnormal; Notable for the following:    WBC 10.9 (*)    Hemoglobin 12.4 (*)    HCT 37.7 (*)    Neutro Abs 8.0 (*)    Monocytes Absolute 1.6 (*)    All other components within normal limits  COMPREHENSIVE METABOLIC PANEL - Abnormal; Notable for the following:    CO2 20 (*)    Glucose, Bld 116 (*)    BUN 26 (*)    Creatinine, Ser 1.32 (*)    Calcium 8.6 (*)    ALT 11 (*)    Total Bilirubin 1.3 (*)    GFR calc non Af Amer 55 (*)    All other components within normal limits  URINE CULTURE  LIPASE, BLOOD  URINALYSIS, ROUTINE W REFLEX MICROSCOPIC (NOT AT [facility])  POC OCCULT BLOOD, ED    Imaging Review Ct Abdomen Pelvis W Contrast  01/29/2016  CLINICAL DATA:  Right abdominal pain and black, tarry stools for the past 3 days. EXAM: CT ABDOMEN AND PELVIS WITH CONTRAST TECHNIQUE: Multidetector CT imaging of the abdomen and pelvis was performed using the standard protocol following bolus administration of intravenous  contrast. CONTRAST:  185mL ISOVUE-300 IOPAMIDOL (ISOVUE-300) INJECTION 61% COMPARISON:  12/07/2008. FINDINGS: Lower chest: Mild left basilar atelectasis or scarring and minimal right basilar atelectasis or scarring with improvement. Hepatobiliary: Stable small calcification at the anterior aspect of the liver on the right. Interval visualization of a 4 mm oval area of low density in the superior aspect of the liver on the right, on image 16. The remainder the liver has a normal appearance.  Normal appearing gallbladder. Pancreas: No mass, inflammatory changes, or other significant abnormality. Spleen: Within normal limits in size and appearance. Adrenals/Urinary Tract: Bilateral renal cysts. Normal appearing urinary bladder and ureters. No urinary tract calculi or hydronephrosis. Normal appearing adrenal glands. Stomach/Bowel: No gastrointestinal abnormalities. Normal appearing appendix. Vascular/Lymphatic: Mild atheromatous arterial calcifications. No enlarged lymph nodes. Reproductive: Minimally enlarged prostate gland containing a small amount of coarse calcification. Other: Tiny umbilical hernia containing fat. Musculoskeletal: Lumbar and lower thoracic spine degenerative changes. Mild left hip degenerative changes. Right femur fixation hardware. IMPRESSION: 1. Interval 4 mm probable cyst in the liver. 2. No acute abnormality. Electronically Signed   By: Claudie Revering M.D.   On: 01/29/2016 11:16   US Abdomen Limited Ruq  01/29/2016  CLINICAL DATA:  Right upper quadrant abdominal pain. EXAM: US ABDOMEN LIMITED - RIGHT UPPER QUADRANT COMPARISON:  Abdomen CT obtained earlier today. FINDINGS: Gallbladder: No gallstones or wall thickening visualized. No sonographic Murphy sign noted by sonographer. Common bile duct: Diameter: 5.9 mm Liver: No focal lesion identified. Within normal limits in parenchymal echogenicity. IMPRESSION: Normal examination. Electronically Signed   By: Claudie Revering M.D.   On: 01/29/2016 15:31    I have personally reviewed and evaluated these images and lab results as part of my medical decision-making.   EKG Interpretation None      MDM   Final diagnoses:  RUQ abdominal pain   Pt here for evaluation of abdominal pain and black stools.  No evidence of GI bleed on exam, last BM three days ago - major GI bleed unlikely.  CT abd obtained given his significant tenderness - no acute abnormalities.  RUQ [place] also negative for acute abnormalities.  Presentation is not c/w acute appendicitis, cholecystitis, diverticulitis, ischemic colitis.  D/w pt home care for pain, outpatient follow up, return precautions.        Quintella Reichert, MD 01/29/16 1731

## 2016-01-30 ENCOUNTER — Telehealth: Payer: Self-pay | Admitting: Internal Medicine

## 2016-01-30 LAB — URINE CULTURE: CULTURE: NO GROWTH

## 2016-01-30 NOTE — Telephone Encounter (Signed)
Patient with black stools and upper abdominal pain that doubles him over for the last few days.  He was taking celebrex, but has stopped it recently.  He had a negative [place] and CT yesterday in the ED.  He will come in and see Nicoletta Ba PA tomorrow at 2:00

## 2016-01-31 ENCOUNTER — Ambulatory Visit (INDEPENDENT_AMBULATORY_CARE_PROVIDER_SITE_OTHER): Payer: PPO | Admitting: Physician Assistant

## 2016-01-31 ENCOUNTER — Other Ambulatory Visit (INDEPENDENT_AMBULATORY_CARE_PROVIDER_SITE_OTHER): Payer: PPO

## 2016-01-31 ENCOUNTER — Encounter: Payer: Self-pay | Admitting: Physician Assistant

## 2016-01-31 VITALS — BP 110/70 | HR 80 | Ht 71.5 in | Wt 223.4 lb

## 2016-01-31 DIAGNOSIS — K5730 Diverticulosis of large intestine without perforation or abscess without bleeding: Secondary | ICD-10-CM

## 2016-01-31 DIAGNOSIS — R1011 Right upper quadrant pain: Secondary | ICD-10-CM

## 2016-01-31 DIAGNOSIS — R195 Other fecal abnormalities: Secondary | ICD-10-CM

## 2016-01-31 LAB — CBC WITH DIFFERENTIAL/PLATELET
Basophils Absolute: 0 10*3/uL (ref 0.0–0.1)
Basophils Relative: 0.4 % (ref 0.0–3.0)
EOS ABS: 0.3 10*3/uL (ref 0.0–0.7)
EOS PCT: 2.4 % (ref 0.0–5.0)
HEMATOCRIT: 37.6 % — AB (ref 39.0–52.0)
HEMOGLOBIN: 12.6 g/dL — AB (ref 13.0–17.0)
LYMPHS PCT: 7.4 % — AB (ref 12.0–46.0)
Lymphs Abs: 0.8 10*3/uL (ref 0.7–4.0)
MCHC: 33.5 g/dL (ref 30.0–36.0)
MCV: 90.5 fl (ref 78.0–100.0)
MONO ABS: 1.5 10*3/uL — AB (ref 0.1–1.0)
Monocytes Relative: 13 % — ABNORMAL HIGH (ref 3.0–12.0)
Neutro Abs: 8.9 10*3/uL — ABNORMAL HIGH (ref 1.4–7.7)
Neutrophils Relative %: 76.9 % (ref 43.0–77.0)
Platelets: 379 10*3/uL (ref 150.0–400.0)
RBC: 4.15 Mil/uL — AB (ref 4.22–5.81)
RDW: 14.6 % (ref 11.5–15.5)
WBC: 11.5 10*3/uL — ABNORMAL HIGH (ref 4.0–10.5)

## 2016-01-31 NOTE — Progress Notes (Signed)
Patient ID: Nicholas [name], male   DOB: Aug 02, 1951, 65 y.o.   MRN: [id number]   Subjective:    Patient ID: Nicholas [name], male    DOB: 1951-05-06, 65 y.o.   MRN: [id number]  HPI Nicholas [name] is a pleasant 65 year old white male known to Dr. [name]. He has history of diverticulosis and adenomatous colon polyps. He last underwent colonoscopy in 2014 at which time no polyps were found and he was noted to have moderate left-sided diverticulosis. He is to have 10 year interval follow-up. Patient has problems with degenerative disc disease, osteoarthritis, chronic headaches and chronic left lower extremity pain for which she has been on Ultram and Topamax as well as Neurontin. Patient says he had been taking Celebrex regularly for the past year or so. He said he had an episode of black stool about a month ago, stopped taking Celebrex and the black stool resolved then recurred asked week. He says he noted a very dark blackish bowel movement for several days last week but has not had a bowel movement now over the past 5 days. He had not been taking any Pepto-Bismol but after he stopped the Celebrex started taking Aleve on a fairly regular basis. He developed right upper abdominal pain and pressure last weekend and then had an ER visit on 01/29/2016 for the abdominal pain. He describes it as a constant pressure type feeling with some nausea but no vomiting. Has been able to eat feels best after eating yogurt. Upper abdominal ultrasound on 01/29/2016 was normal and CT of the abdomen and pelvis same day showed a tiny umbilical hernia containing fat and a 4 mm probable cyst in the liver otherwise negative exam. Stool was documented to be Hemoccult negative WBC 10.9 hemoglobin 12.4 hematocrit of 37.7 creatinine slightly elevated at 1.3 to lipase normal. LFTs normal with the exception of a T bili of 1.3. He was started on Protonix 20 mg twice a day and asked to stop NSAIDs.  Review of Systems Pertinent positive and negative  review of systems were noted in the above HPI section.  All other review of systems was otherwise negative.  Outpatient Encounter Prescriptions as of 01/31/2016  Medication Sig  . EPINEPHrine (EPIPEN JR) 0.15 MG/0.3ML injection Inject 0.15 mg into the muscle.  [name] FLUoxetine (PROZAC) 40 MG capsule Take 40 mg by mouth daily.  [name] gabapentin (NEURONTIN) 300 MG capsule Take 300 mg by mouth 2 (two) times daily.   . hydrocortisone 2.5 % cream Apply 1 application topically at bedtime as needed (for rash).   . pantoprazole (PROTONIX) 20 MG tablet Take 1 tablet (20 mg total) by mouth 2 (two) times daily before a meal.  . Probiotic Product (PROBIOTIC PO) Take 1 tablet by mouth daily.  [name] topiramate (TOPAMAX) 25 MG tablet Take 25 mg by mouth 2 (two) times daily.   . traMADol (ULTRAM) 50 MG tablet Take 1 tablet (50 mg total) by mouth every 6 (six) hours as needed.  . naproxen sodium (ANAPROX) 220 MG tablet Take 220 mg by mouth 2 (two) times daily as needed (for pain).    No facility-administered encounter medications on file as of 01/31/2016.   Allergies  Allergen Reactions  . Bee Venom Anaphylaxis    Respiratory Distress  . Oxycodone-Acetaminophen Hives  . Peanut-Containing Drug Products Anaphylaxis and Dermatitis  . Percocet [Oxycodone-Acetaminophen] Hives  . Celebrex [Celecoxib] Other (See Comments)    Black stool  . Latex Rash  . Nsaids Other (See Comments)  Disagrees with system   Patient Active Problem List   Diagnosis Date Noted  . Diverticulosis of colon without hemorrhage 01/31/2016  . Chest pain 01/11/2012  . Chronic headache 01/11/2012  . METHICILLIN SUSCEPTIBLE STAPH INF CCE & UNS SITE 07/04/2010  . ULNAR NERVE ENTRAPMENT, LEFT 07/04/2010  . ECZEMA 07/04/2010  . DEGENERATIVE DISC DISEASE 07/04/2010  . OLECRANON BURSITIS, LEFT 07/04/2010  . MALAR AND MAXILLARY BONES CLOSED FRACTURE 07/04/2010  . KNEE REPLACEMENT, HX OF 07/04/2010  . OSTEOARTHRITIS 06/09/2007  . Personal history  of colonic adenoma 06/01/2003   Social History   Social History  . Marital Status: Married    Spouse Name: N/A  . Number of Children: 2  . Years of Education: 13   Occupational History  . retired    Social History Main Topics  . Smoking status: Never Smoker   . Smokeless tobacco: Never Used  . Alcohol Use: Yes     Comment: "I abused alcohol; last drink  ~ 2005"  . Drug Use: No  . Sexual Activity: No   Other Topics Concern  . Not on file   Social History Narrative   Exercise: Walking.    Nicholas [name] family history includes Cancer in his sister; Coronary artery disease in his father; Diabetes in his brother and sister; Heart disease in his mother. There is no history of Colon cancer or Stomach cancer.      Objective:    Filed Vitals:   01/31/16 1422  BP: 110/70  Pulse: 80    Physical Exam  well-developed older white male in no acute distress, pleasant blood pressure 110/70 pulse 80 height 5 foot 11 weight 223. HEENT; nontraumatic, cephalic EOMI PERRLA sclera anicteric, Cardiovascular; regular rate and rhythm with S1-S2 no murmur or gallop, Pulmonary; clear bilaterally, Abdomen ;soft he's tender in the right mid quadrant and epigastrium there is no guarding or rebound no palpable mass or hepatosplenomegaly bowel sounds are present, Rectal ;exam firm stool dark brown and heme positive, Extremities;, he has a brace on the left lower extremity, Neuropsych ;mood and affect appropriate     Assessment & Plan:   #51 65 year old white male male with complaints of black stool last week, no bowel movement over the past 4 days. He was documented Hemoccult negative in the ER but is heme positive today without overt melena. #2  4-5 day history of upper and right mid quadrant abdominal pain. Negative ultrasound and CT on 01/29/2016. I suspect he has an NSAID-induced peptic ulcer disease or gastropathy #3 chronic left lower extremity pain/neuropathy #4 generative this disease #5  diverticulosis  Plan; Will increase Protonix to 40 mg by mouth twice a day 1 month then once daily thereafter  Patient was asked to stop all NSAID use Soft bland diet Will repeat CBC today to assure stable Schedule for upper endoscopy with Dr. [name] next week GERD Procedure discussed in detail with patient and he is agreeable to proceed He is aware he should seek medical care should he have any more overt melena or bright red blood per rectum.  Makela Niehoff S Corran Lalone PA-C 01/31/2016   Cc: Mia Creek, MD

## 2016-01-31 NOTE — Patient Instructions (Addendum)
Please go to the basement level to have your labs drawn.   Take no Advil, Aleve or Motrin , or Celebrex.   Take the Pantoprazole sodium 40 mg , take 1 tablet twice daily for one month, then go to once daily.   You have been scheduled for an endoscopy. Please follow written instructions given to you at your visit today. If you use inhalers (even only as needed), please bring them with you on the day of your procedure. Your physician has requested that you go to www.startemmi.com and enter the access code given to you at your visit today. This web site gives a general overview about your procedure. However, you should still follow specific instructions given to you by our office regarding your preparation for the procedure.

## 2016-02-03 NOTE — Progress Notes (Signed)
Agree with Ms. Esterwood's assessment and plan. Dyamond Tolosa E. Xianna Siverling, MD, FACG   

## 2016-02-08 ENCOUNTER — Ambulatory Visit (INDEPENDENT_AMBULATORY_CARE_PROVIDER_SITE_OTHER): Payer: PPO

## 2016-02-08 ENCOUNTER — Ambulatory Visit (INDEPENDENT_AMBULATORY_CARE_PROVIDER_SITE_OTHER): Payer: PPO | Admitting: Emergency Medicine

## 2016-02-08 ENCOUNTER — Other Ambulatory Visit: Payer: Self-pay | Admitting: Orthopedic Surgery

## 2016-02-08 VITALS — BP 110/70 | HR 90 | Temp 98.0°F | Resp 18 | Ht 71.5 in | Wt 211.0 lb

## 2016-02-08 DIAGNOSIS — M542 Cervicalgia: Secondary | ICD-10-CM

## 2016-02-08 DIAGNOSIS — S6991XA Unspecified injury of right wrist, hand and finger(s), initial encounter: Secondary | ICD-10-CM | POA: Diagnosis not present

## 2016-02-08 DIAGNOSIS — M25531 Pain in right wrist: Secondary | ICD-10-CM

## 2016-02-08 LAB — POCT CBC
Granulocyte percent: 87.4 %G — AB (ref 37–80)
HCT, POC: 39 % — AB (ref 43.5–53.7)
Hemoglobin: 13.6 g/dL — AB (ref 14.1–18.1)
LYMPH, POC: 1.1 (ref 0.6–3.4)
MCH: 30.2 pg (ref 27–31.2)
MCHC: 34.9 g/dL (ref 31.8–35.4)
MCV: 86.6 fL (ref 80–97)
MID (CBC): 0.4 (ref 0–0.9)
MPV: 6.1 fL (ref 0–99.8)
PLATELET COUNT, POC: 484 10*3/uL — AB (ref 142–424)
POC Granulocyte: 10.1 — AB (ref 2–6.9)
POC LYMPH PERCENT: 9.4 %L — AB (ref 10–50)
POC MID %: 3.2 %M (ref 0–12)
RBC: 4.5 M/uL — AB (ref 4.69–6.13)
RDW, POC: 14.1 %
WBC: 11.6 10*3/uL — AB (ref 4.6–10.2)

## 2016-02-08 LAB — COMPREHENSIVE METABOLIC PANEL
ALK PHOS: 213 U/L — AB (ref 40–115)
ALT: 32 U/L (ref 9–46)
AST: 40 U/L — ABNORMAL HIGH (ref 10–35)
Albumin: 3.6 g/dL (ref 3.6–5.1)
BILIRUBIN TOTAL: 0.9 mg/dL (ref 0.2–1.2)
BUN: 33 mg/dL — ABNORMAL HIGH (ref 7–25)
CALCIUM: 9.4 mg/dL (ref 8.6–10.3)
CO2: 19 mmol/L — ABNORMAL LOW (ref 20–31)
Chloride: 104 mmol/L (ref 98–110)
Creat: 1.2 mg/dL (ref 0.70–1.25)
Glucose, Bld: 100 mg/dL — ABNORMAL HIGH (ref 65–99)
Potassium: 4.4 mmol/L (ref 3.5–5.3)
Sodium: 137 mmol/L (ref 135–146)
Total Protein: 7.3 g/dL (ref 6.1–8.1)

## 2016-02-08 LAB — URIC ACID: URIC ACID, SERUM: 5 mg/dL (ref 4.0–7.8)

## 2016-02-08 LAB — POCT SEDIMENTATION RATE: POCT SED RATE: 112 mm/hr — AB (ref 0–22)

## 2016-02-08 NOTE — Patient Instructions (Addendum)
[place], [place], [place] [address] [phone]    IF you received an x-ray today, you will receive an invoice from [facility]. Please contact [facility] at [phone] with questions or concerns regarding your invoice.   IF you received labwork today, you will receive an invoice from [employer]. Please contact [facility] at [phone] with questions or concerns regarding your invoice.   Our billing staff will not be able to assist you with questions regarding bills from these companies.  You will be contacted with the lab results as soon as they are available. The fastest way to get your results is to activate your My Chart account. Instructions are located on the last page of this paperwork. If you have not heard from [place] regarding the results in 2 weeks, please contact this office.

## 2016-02-08 NOTE — Progress Notes (Signed)
By signing my name below, I, Raven Small, attest that this documentation has been prepared under the direction and in the presence of Nena Jordan, MD.  Electronically Signed: Thea Alken, ED Scribe. 02/08/2016. 10:13 AM.  Chief Complaint:  Chief Complaint  Patient presents with  . Arm Pain    right arm and hand since last Sunday     HPI: Nicholas [name] is a 65 y.o. male who reports to [facility] today complaining of right arm pain that began 4 days ago. Pt reports pain starts at right wrist and goes up to right shoulder along with some neck pain. He\'s also noticed swelling of right wrist and has had associated. He initially suspected that he slept on right arm wrong but pain persisted. He has pain with movement of arm as well as some pain with movement of right fingers. Pt is having in rectum endoscopy at [facility] for suspected diverticulitis or ulcer colitis in 5 days. Pt has not noticed blood in stool but reports having very dark stools.   Past Medical History  Diagnosis Date  . Blood transfusion   . GERD (gastroesophageal reflux disease)   . History of stomach ulcers   . Lower GI bleeding   . Migraine     "definitely"  . Depression   . Acute kidney failure 08/2008  . Pneumonia 08/2008    "while in ICU"  . CLOSTRIDIUM DIFFICILE COLITIS 07/04/2010    Annotation: 12/09, 2/10 Qualifier: Diagnosis of  By: Campbell MD, [name]    . Personal history of colonic adenoma 06/01/2003  . CHF (congestive heart failure) (HCC)   . Seizures (HCC)   . Asthma   . Heart attack (HCC)     20 09 personal history of 3  . Stroke [facility])    Past Surgical History  Procedure Laterality Date  . Joint replacement    . Knee fusion  03/2009    right knee removal; antibiotic spacers;   . Anterior  nerve transposition  07/2009    left ulnar nerve  . Toe amputation  08/2008    left great toe  . Antibiotic spacer exchange  11/2008; 08/2006    right knee  . Replacement total knee  08/2008; 09/2001    right  .  Arthrotomy  08/2008    right knee w/I&D  . Hardware removal  04/2006    right knee w/antibiotic spacers placed  . Synovectomy  06/2005    debridement, liner exchange right knee  . Knee arthroscopy  10/2001    right  . Inguinal hernia repair  early 1990's    bilateral   Social History   Social History  . Marital Status: Married    Spouse Name: N/A  . Number of Children: 2  . Years of Education: 13   Occupational History  . retired    Social History Main Topics  . Smoking status: Never Smoker   . Smokeless tobacco: Never Used  . Alcohol Use: Yes     Comment: "I abused alcohol; last drink  ~ 2005"  . Drug Use: No  . Sexual Activity: No   Other Topics Concern  . None   Social History Narrative   Exercise: Walking.   Family History  Problem Relation Age of Onset  . Coronary artery disease Father   . Heart disease Mother   . Colon cancer Neg Hx   . Stomach cancer Neg Hx   . Cancer Sister     breast  .  Diabetes Brother   . Diabetes Sister    Allergies  Allergen Reactions  . Bee Venom Anaphylaxis    Respiratory Distress  . Oxycodone-Acetaminophen Hives  . Peanut-Containing Drug Products Anaphylaxis and Dermatitis  . Percocet [Oxycodone-Acetaminophen] Hives  . Celebrex [Celecoxib] Other (See Comments)    Black stool  . Latex Rash  . Nsaids Other (See Comments)    Disagrees with system   Prior to Admission medications   Medication Sig Start Date End Date Taking? Authorizing Provider  EPINEPHrine (EPIPEN JR) 0.15 MG/0.3ML injection Inject 0.15 mg into the muscle. 01/10/16 02/09/16 Yes Historical Provider, MD  FLUoxetine (PROZAC) 40 MG capsule Take 40 mg by mouth daily. 04/14/15  Yes Historical Provider, MD  gabapentin (NEURONTIN) 300 MG capsule Take 300 mg by mouth 2 (two) times daily.    Yes Historical Provider, MD  hydrocortisone 2.5 % cream Apply 1 application topically at bedtime as needed (for rash).  12/13/15  Yes Historical Provider, MD  naproxen sodium  (ANAPROX) 220 MG tablet Take 220 mg by mouth 2 (two) times daily as needed (for pain).    Yes Historical Provider, MD  pantoprazole (PROTONIX) 20 MG tablet Take 1 tablet (20 mg total) by mouth 2 (two) times daily before a meal. 01/29/16  Yes Quintella Reichert, MD  Probiotic Product (PROBIOTIC PO) Take 1 tablet by mouth daily.   Yes Historical Provider, MD  topiramate (TOPAMAX) 25 MG tablet Take 25 mg by mouth 2 (two) times daily.    Yes Historical Provider, MD  traMADol (ULTRAM) 50 MG tablet Take 1 tablet (50 mg total) by mouth every 6 (six) hours as needed. 01/29/16  Yes Quintella Reichert, MD     ROS: The patient denies fevers, chills, night sweats, unintentional weight loss, chest pain, palpitations, wheezing, dyspnea on exertion, nausea, vomiting, abdominal pain, dysuria, hematuria, melena, numbness, weakness, or tingling.   All other systems have been reviewed and were otherwise negative with the exception of those mentioned in the HPI and as above.    PHYSICAL EXAM: Filed Vitals:   02/08/16 1003  BP: 110/70  Pulse: 90  Temp: 98 F (36.7 C)  Resp: 18   Body mass index is 29.02 kg/(m^2).   General: Alert, no acute distress HEENT:  Normocephalic, atraumatic, oropharynx patent. Eye: Juliette Mangle [facility] Cardiovascular:  Regular rate and rhythm, no rubs murmurs or gallops.  No Carotid bruits, radial pulse intact. No pedal edema.  Respiratory: Clear to auscultation bilaterally.  No wheezes, rales, or rhonchi.  No cyanosis, no use of accessory musculature Abdominal: No organomegaly, abdomen is soft and non-tender, positive bowel sounds.  No masses. Musculoskeletal: . Good ROM of neck. No swelling of the right upper arm or forearm. Significant swelling over right wrist. Mild redness. Increased warmth and pain with flexion and extension.  Skin: No rashes. Neurologic: Facial musculature symmetric. Psychiatric: Patient acts appropriately throughout our interaction. Lymphatic: No cervical or  submandibular lymphadenopathy    LABS: Results for orders placed or performed in visit on 02/08/16  POCT CBC  Result Value Ref Range   WBC 11.6 (A) 4.6 - 10.2 K/uL   Lymph, poc 1.1 0.6 - 3.4   POC LYMPH PERCENT 9.4 (A) 10 - 50 %L   MID (cbc) 0.4 0 - 0.9   POC MID % 3.2 0 - 12 %M   POC Granulocyte 10.1 (A) 2 - 6.9   Granulocyte percent 87.4 (A) 37 - 80 %G   RBC 4.50 (A) 4.69 - 6.13 M/uL   Hemoglobin  13.6 (A) 14.1 - 18.1 g/dL   HCT, POC 39.0 (A) 43.5 - 53.7 %   MCV 86.6 80 - 97 fL   MCH, POC 30.2 27 - 31.2 pg   MCHC 34.9 31.8 - 35.4 g/dL   RDW, POC 14.1 %   Platelet Count, POC 484 (A) 142 - 424 K/uL   MPV 6.1 0 - 99.8 fL   XRAY: Dg Cervical Spine 2 Or 3 Views  02/08/2016  CLINICAL DATA:  Right-sided neck pain. EXAM: CERVICAL SPINE - 2-3 VIEW COMPARISON:  MRI of the cervical spine dated 01/17/2010 FINDINGS: There is no fracture or prevertebral soft tissue swelling. There is fairly severe degenerative disc disease at C5-6 and C6-7. There is auto fusion of the left lateral masses at C4-5. Slight narrowing of the C4-5 disc space. Moderate severe facet arthritis at C3-4. Slight cervicothoracic scoliosis. IMPRESSION: No acute abnormalities. Multilevel degenerative disc and joint disease as described. No significant change since the prior exam. Electronically Signed   By: Lorriane Shire M.D.   On: 02/08/2016 10:55   Dg Wrist Complete Right  02/08/2016  CLINICAL DATA:  Status post fall, follow-up study. EXAM: RIGHT WRIST - COMPLETE 3+ VIEW COMPARISON:  None in PACs FINDINGS: The bones of the wrist are adequately mineralized. The distal radius and ulna are intact. No healing fracture is observed. The radiocarpal and ulnocarpal joint spaces are preserved. The scaphoid is intact where visualized. The other carpal bones also appear intact. However, there is high-grade narrowing of the articulation of the distal pole of the scaphoid with the trapezium and trapezoid. There is moderate degenerative  change of the first carpometacarpal joint. The metacarpals are intact where visualized. There is soft tissue swelling over the dorsum of the wrist. IMPRESSION: No acute fracture or dislocation is observed. There is moderate to severe degenerative change involving the intercarpal joints and first carpometacarpal joint as described. There is soft tissue swelling dorsally. Electronically Signed   By: David  Martinique M.D.   On: 02/08/2016 10:54   ASSESSMENT/PLAN: Patient presents with an acute right wrist mono arthropathy. I have concerns whether this is infectious versus gout. His orthopedist will be called and we will send to their office for evaluation for joint aspiration.I personally performed the services described in this documentation, which was scribed in my presence. The recorded information has been reviewed and is accurate.   Gross sideeffects, risk and benefits, and alternatives of medications d/w patient. Patient is aware that all medications have potential sideeffects and we are unable to predict every sideeffect or drug-drug interaction that may occur.  Arlyss Queen MD 02/08/2016 10:13 AM

## 2016-02-09 ENCOUNTER — Ambulatory Visit
Admission: RE | Admit: 2016-02-09 | Discharge: 2016-02-09 | Disposition: A | Discharge status: Home / Self Care | Payer: PPO | Source: Ambulatory Visit | Attending: Orthopedic Surgery | Admitting: Orthopedic Surgery

## 2016-02-09 ENCOUNTER — Telehealth: Payer: Self-pay | Admitting: Emergency Medicine

## 2016-02-09 DIAGNOSIS — M25531 Pain in right wrist: Secondary | ICD-10-CM

## 2016-02-09 DIAGNOSIS — S63511A Sprain of carpal joint of right wrist, initial encounter: Secondary | ICD-10-CM | POA: Diagnosis not present

## 2016-02-09 LAB — ANA: ANA: NEGATIVE

## 2016-02-09 LAB — CYCLIC CITRUL PEPTIDE ANTIBODY, IGG

## 2016-02-09 MED ORDER — GADOBENATE DIMEGLUMINE 529 MG/ML IV SOLN
20.0000 mL | Freq: Once | INTRAVENOUS | Status: AC | PRN
Start: 2016-02-09 — End: 2016-02-09
  Administered 2016-02-09: 20 mL via INTRAVENOUS

## 2016-02-09 NOTE — Telephone Encounter (Signed)
I called and spoke with patient. He had an MRI today. He has not heard from Dr. [name] Sa yet. He is due to have endoscopy by Dr. [name] on Monday. I will attempt to call Dr. [name] tomorrow regarding his abnormal blood work. He will wait to hear from Dr. [name] Sa regarding treatment of his painful right upper extremity.

## 2016-02-09 NOTE — Telephone Encounter (Signed)
  Dr. Everlene Farrier spoke with pt please see previous message

## 2016-02-10 ENCOUNTER — Telehealth: Payer: Self-pay | Admitting: Internal Medicine

## 2016-02-10 NOTE — Telephone Encounter (Signed)
Discussed with Dr. [name] and the patient.  Will cancel the EGD for now. He will call back once his arm has improved.  He meets with Dr. [name] Sa from ortho next week.

## 2016-02-13 ENCOUNTER — Encounter: Payer: PPO | Admitting: Internal Medicine

## 2016-02-13 DIAGNOSIS — K2980 Duodenitis without bleeding: Secondary | ICD-10-CM

## 2016-02-13 HISTORY — PX: ESOPHAGOGASTRODUODENOSCOPY: SHX1529

## 2016-02-13 HISTORY — DX: Duodenitis without bleeding: K29.80

## 2016-02-15 DIAGNOSIS — M25531 Pain in right wrist: Secondary | ICD-10-CM | POA: Diagnosis not present

## 2016-02-20 ENCOUNTER — Other Ambulatory Visit: Payer: PPO

## 2016-02-21 ENCOUNTER — Telehealth: Payer: Self-pay

## 2016-02-21 NOTE — Telephone Encounter (Signed)
Please verify that this indeed topomax.  I do not see it anywhere that we have given him this medication, ever.  He will need to come in here

## 2016-02-21 NOTE — Telephone Encounter (Signed)
Patient needs his topiramate (TOPAMAX) 25 MG tablet refilled, stated his pharmacy told him he had to come back here to have them filled. Patient states that he is going to start going to [place] at [facility] but he can not get in there until august which it is scheduled in [place]. [facility] [address].  His call back number is [phone]

## 2016-02-21 NOTE — Telephone Encounter (Signed)
Can i refill until August?

## 2016-02-22 DIAGNOSIS — M25531 Pain in right wrist: Secondary | ICD-10-CM | POA: Diagnosis not present

## 2016-02-25 ENCOUNTER — Ambulatory Visit (INDEPENDENT_AMBULATORY_CARE_PROVIDER_SITE_OTHER): Payer: PPO | Admitting: Family Medicine

## 2016-02-25 VITALS — BP 122/80 | HR 71 | Temp 97.8°F | Resp 16 | Ht 71.5 in | Wt 216.2 lb

## 2016-02-25 DIAGNOSIS — G894 Chronic pain syndrome: Secondary | ICD-10-CM | POA: Diagnosis not present

## 2016-02-25 MED ORDER — GABAPENTIN 300 MG PO CAPS: 300.0000 mg | ORAL_CAPSULE | Freq: Four times a day (QID) | ORAL | Status: DC

## 2016-02-25 MED ORDER — FLUOXETINE HCL 40 MG PO CAPS
40.0000 mg | ORAL_CAPSULE | Freq: Every day | ORAL | Status: DC
Start: 2016-02-25 — End: 2016-10-05

## 2016-02-25 MED ORDER — TRAMADOL HCL 50 MG PO TABS: 50.0000 mg | ORAL_TABLET | Freq: Four times a day (QID) | ORAL | Status: DC | PRN

## 2016-02-25 MED ORDER — TOPIRAMATE 25 MG PO TABS: 25.0000 mg | ORAL_TABLET | Freq: Two times a day (BID) | ORAL | Status: DC

## 2016-02-25 NOTE — Patient Instructions (Addendum)
     IF you received an x-ray today, you will receive an invoice from [facility]. Please contact [facility] at [phone] with questions or concerns regarding your invoice.   IF you received labwork today, you will receive an invoice from [employer]. Please contact [facility] at [phone] with questions or concerns regarding your invoice.   Our billing staff will not be able to assist you with questions regarding bills from these companies.  You will be contacted with the lab results as soon as they are available. The fastest way to get your results is to activate your My Chart account. Instructions are located on the last page of this paperwork. If you have not heard from [place] regarding the results in 2 weeks, please contact this office.    I have referred you to Dr. Marciano Sequin at [place] for Prescribing Controlled Substances (Revised 08/2012) 1. Prescriptions for controlled substances will be filled by ONE provider at [facility] with whom you have established and developed a plan for your care, including follow-up. 2. You are encouraged to schedule an appointment with your prescriber at our appointment center for follow-up visits whenever possible. 3. If you request a prescription for the controlled substance while at [facility] for an acute problem (with someone other than your regular prescriber), you MAY be given a ONE-TIME prescription for a 30-day supply of the controlled substance, to allow time for you to return to see your regular prescriber for additional prescriptions.

## 2016-02-25 NOTE — Telephone Encounter (Signed)
Pt in office now for refill

## 2016-02-25 NOTE — Progress Notes (Addendum)
Subjective:  By signing my name below, I, Nicholas [name], attest that this documentation has been prepared under the direction and in the presence of Delman Cheadle, MD. Electronically Signed: Moises [name], [place]. 02/25/2016 , 4:07 PM .  Patient was seen in Room 5 .   Patient ID: Nicholas [name], male    DOB: 1951-03-27, 65 y.o.   MRN: [id number] Chief Complaint  Patient presents with  . Medication Refill    gabapentin, tramadol, and topamax   HPI Nicholas [name] is a 65 y.o. male who presents to [facility] for medication refill.  He takes topamax and tramadol for pain. He almost lost his right leg due to staph infection and has a steel rod placed in 2010.  He's been taking prozac due to loss of his wife (married for 3 years) and seeing 2 counselors. He denies any suicidal ideations.    He's scheduled to have hemorrhoid surgery soon.   His PCP was previously Dr. Jobe Igo at [facility] in [facility]. However, he has retired.  His new PCP won't pick up until August at [place].   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   Past Medical History  Diagnosis Date  . [name] transfusion   . GERD (gastroesophageal reflux disease)   . History of stomach ulcers   . Lower GI bleeding   . Migraine     "definitely"  . Depression   . Acute kidney failure 08/2008  . Pneumonia 08/2008    "while in ICU"  . CLOSTRIDIUM DIFFICILE COLITIS 07/04/2010    Annotation: 12/09, 2/10 Qualifier: Diagnosis of  By: [name], [name]    . Personal history of colonic adenoma 06/01/2003  . CHF (congestive heart failure) ([place])   . Seizures ([place])   . Asthma   . Heart attack [facility])     2009 personal history of 3  . Stroke [facility])    Prior to Admission medications   Medication Sig Start Date End Date Taking? Authorizing Provider  FLUoxetine (PROZAC) 40 MG capsule Take 40 mg by mouth daily. 04/14/15  Yes Historical Provider, MD  gabapentin (NEURONTIN) 300 MG capsule Take 300 mg  by mouth 2 (two) times daily.    Yes Historical Provider, MD  hydrocortisone 2.5 % cream Apply 1 application topically at bedtime as needed (for rash).  12/13/15  Yes Historical Provider, MD  Probiotic Product (PROBIOTIC PO) Take 1 tablet by mouth daily.   Yes Historical Provider, MD  topiramate (TOPAMAX) 25 MG tablet Take 25 mg by mouth 2 (two) times daily.    Yes Historical Provider, MD  traMADol (ULTRAM) 50 MG tablet Take 1 tablet (50 mg total) by mouth every 6 (six) hours as needed. 01/29/16  Yes Quintella Reichert, MD  naproxen sodium (ANAPROX) 220 MG tablet Take 220 mg by mouth 2 (two) times daily as needed (for pain). Reported on 02/25/2016    Historical Provider, MD  pantoprazole (PROTONIX) 20 MG tablet Take 1 tablet (20 mg total) by mouth 2 (two) times daily before a meal. Patient not taking: Reported on 02/25/2016 01/29/16   Quintella Reichert, MD   Allergies  Allergen Reactions  . Bee Venom Anaphylaxis    Respiratory Distress  . Oxycodone-Acetaminophen Hives  . Peanut-Containing Drug Products Anaphylaxis and Dermatitis  . Percocet [Oxycodone-Acetaminophen] Hives  . Celebrex [Celecoxib] Other (See Comments)    Black stool  . Latex Rash  . Nsaids Other (See Comments)    Disagrees with system  Review of Systems  Constitutional: Negative for fatigue and unexpected weight change.  Respiratory: Negative for cough, chest tightness and shortness of breath.   Cardiovascular: Negative for chest pain, palpitations and leg swelling.  Gastrointestinal: Negative for abdominal pain and [name] in stool.  Musculoskeletal: Positive for myalgias and gait problem.  Neurological: Negative for dizziness, light-headedness and headaches.  Psychiatric/Behavioral: Positive for dysphoric mood.       Objective:   Physical Exam  Constitutional: He is oriented to person, place, and time. He appears well-developed and well-nourished. No distress.  HENT:  Head: Normocephalic and atraumatic.  Eyes: EOM are  normal. Pupils are equal, round, and reactive to light.  Neck: Neck supple. Thyromegaly (question enlargement of right thyroid) present.  Cardiovascular: Normal rate, regular rhythm and normal heart sounds.   Pulmonary/Chest: Effort normal and breath sounds normal. No respiratory distress.  Musculoskeletal: Normal range of motion.  Neurological: He is alert and oriented to person, place, and time.  Skin: Skin is warm and dry.  Psychiatric: He has a normal mood and affect. His behavior is normal.  Nursing note and vitals reviewed.   BP 122/80 mmHg  Pulse 71  Temp(Src) 97.8 F (36.6 C) (Oral)  Resp 16  Ht 5' 11.5" (1.816 m)  Wt 216 lb 3.2 oz (98.068 kg)  BMI 29.74 kg/m2  SpO2 96%     Assessment & Plan:   1. Chronic pain syndrome   Pt's PCP retired and his appt w/ new PCP is not for 3 mos so is here today for refills to bridge.  Pt has not tolerated hydrocodone and oxycodone prior but tramadol is providing minimal relief so pt requests referral to pain management clinic - he did respond very well to demerol prior so would like to try other medication. Needs f/u OV if he needs any additional refills.  Today I have utilized the [place] Controlled Substance Registry's online query to confirm compliance regarding the patient's narcotic pain medications. My review reveals appropriate prescription fills and that [facility] is the sole provider of these medications. Rechecks will occur regularly and the patient is aware of our use of the system.   Orders Placed This Encounter  Procedures  . Ambulatory referral to Pain Clinic    Referral Priority:  Routine    Referral Type:  Consultation    Referral Reason:  Specialty Services Required    Requested Specialty:  Pain Medicine    Number of Visits Requested:  1    Meds ordered this encounter  Medications  . FLUoxetine (PROZAC) 40 MG capsule    Sig: Take 1 capsule (40 mg total) by mouth daily.    Dispense:  90 capsule     Refill:  1  . gabapentin (NEURONTIN) 300 MG capsule    Sig: Take 1 capsule (300 mg total) by mouth 4 (four) times daily.    Dispense:  360 capsule    Refill:  0  . topiramate (TOPAMAX) 25 MG tablet    Sig: Take 1 tablet (25 mg total) by mouth 2 (two) times daily.    Dispense:  180 tablet    Refill:  0  . traMADol (ULTRAM) 50 MG tablet    Sig: Take 1 tablet (50 mg total) by mouth every 6 (six) hours as needed.    Dispense:  120 tablet    Refill:  2    May fill no sooner than every 25 days    I personally performed the services described  in this documentation, which was scribed in my presence. The recorded information has been reviewed and considered, and addended by me as needed.  Delman Cheadle, MD MPH

## 2016-02-27 ENCOUNTER — Ambulatory Visit (AMBULATORY_SURGERY_CENTER): Payer: PPO | Admitting: Internal Medicine

## 2016-02-27 ENCOUNTER — Encounter: Payer: Self-pay | Admitting: Internal Medicine

## 2016-02-27 VITALS — BP 113/70 | HR 68 | Temp 98.0°F | Resp 11 | Ht 71.0 in | Wt 223.0 lb

## 2016-02-27 DIAGNOSIS — K2900 Acute gastritis without bleeding: Secondary | ICD-10-CM

## 2016-02-27 DIAGNOSIS — Z8673 Personal history of transient ischemic attack (TIA), and cerebral infarction without residual deficits: Secondary | ICD-10-CM | POA: Diagnosis not present

## 2016-02-27 DIAGNOSIS — K269 Duodenal ulcer, unspecified as acute or chronic, without hemorrhage or perforation: Secondary | ICD-10-CM

## 2016-02-27 DIAGNOSIS — R1011 Right upper quadrant pain: Secondary | ICD-10-CM | POA: Diagnosis not present

## 2016-02-27 DIAGNOSIS — I509 Heart failure, unspecified: Secondary | ICD-10-CM | POA: Diagnosis not present

## 2016-02-27 DIAGNOSIS — R109 Unspecified abdominal pain: Secondary | ICD-10-CM | POA: Diagnosis not present

## 2016-02-27 DIAGNOSIS — I252 Old myocardial infarction: Secondary | ICD-10-CM | POA: Diagnosis not present

## 2016-02-27 DIAGNOSIS — K922 Gastrointestinal hemorrhage, unspecified: Secondary | ICD-10-CM | POA: Diagnosis not present

## 2016-02-27 DIAGNOSIS — K2960 Other gastritis without bleeding: Secondary | ICD-10-CM

## 2016-02-27 DIAGNOSIS — N179 Acute kidney failure, unspecified: Secondary | ICD-10-CM | POA: Diagnosis not present

## 2016-02-27 MED ORDER — PANTOPRAZOLE SODIUM 40 MG PO TBEC: 40.0000 mg | DELAYED_RELEASE_TABLET | Freq: Every day | ORAL | Status: DC

## 2016-02-27 MED ORDER — SODIUM CHLORIDE 0.9 % IV SOLN: 500.0000 mL | INTRAVENOUS | Status: DC

## 2016-02-27 NOTE — Op Note (Signed)
[place] Patient Name: Nicholas [name] Procedure Date: 02/27/2016 12:58 PM MRN: [id number] Endoscopist: Gatha Mayer , MD Age: 65 Referring MD:  Date of Birth: [birth year] Gender: Male Procedure:                Upper GI endoscopy Indications:              Abdominal pain in the right upper quadrant, Heme                            positive stool, Melena Medicines:                Propofol per Anesthesia, Monitored Anesthesia Care Procedure:                Pre-Anesthesia Assessment:                           - Prior to the procedure, a History and Physical                            was performed, and patient medications and                            allergies were reviewed. The patient's tolerance of                            previous anesthesia was also reviewed. The risks                            and benefits of the procedure and the sedation                            options and risks were discussed with the patient.                            All questions were answered, and informed consent                            was obtained. Prior Anticoagulants: The patient                            last took naproxen 1 day prior to the procedure.                            ASA Grade Assessment: III - A patient with severe                            systemic disease. After reviewing the risks and                            benefits, the patient was deemed in satisfactory                            condition to undergo the procedure.  After obtaining informed consent, the endoscope was                            passed under direct vision. Throughout the                            procedure, the patient's blood pressure, pulse, and                            oxygen saturations were monitored continuously. The                            Model GIF-HQ190 [id number]) scope was introduced                            through the mouth, and advanced to the  second part                            of duodenum. The upper GI endoscopy was                            accomplished without difficulty. The patient                            tolerated the procedure well. Scope In: Scope Out: Findings:                 A few dispersed, non-bleeding erosions were found                            in the gastric antrum. There were no stigmata of                            recent bleeding. Biopsies were taken with a cold                            forceps for histology. Verification of patient                            identification for the specimen was done. Estimated                            blood loss was minimal.                           Multiple diffuse erosions without bleeding were                            found in the duodenal bulb and in the second                            portion of the duodenum. Biopsies were taken with a  cold forceps for histology. Verification of patient                            identification for the specimen was done. Estimated                            blood loss was minimal.                           A mild [Appearance] deformity was found in the                            duodenal bulb and in the second portion of the                            duodenum.                           The exam was otherwise without abnormality.                           The cardia and gastric fundus were normal on                            retroflexion. Complications:            No immediate complications. Estimated Blood Loss:     Estimated blood loss: none. Impression:               - Non-bleeding erosive gastropathy. Biopsied.                           - Duodenal erosions without bleeding. Biopsied.                           - Duodenal deformity. Recommendation:           - Patient has a contact number available for                            emergencies. The signs and symptoms of potential                             delayed complications were discussed with the                            patient. Return to normal activities tomorrow.                            Written discharge instructions were provided to the                            patient.                           - Resume previous diet.                           -  Continue present medications. Stay on PPI                           - Discontinue NSAIDs. Naproxen - ALEVE Gatha Mayer, MD 02/27/2016 2:01:28 PM This report has been signed electronically.

## 2016-02-27 NOTE — Progress Notes (Signed)
Called to room to assist during endoscopic procedure.  Patient ID and intended procedure confirmed with present staff. Received instructions for my participation in the procedure from the performing physician.  

## 2016-02-27 NOTE — Patient Instructions (Addendum)
I found inflammation in the stomach (minimal) and duodenum (upper intestine). Not sure why but wonder about aleve (naproxen) causing this.  It would be best to stop that for now and be sure to take your pantoprazole.  We will call results and plans  I appreciate the opportunity to care for you. Gatha Mayer, MD, FACG  YOU HAD AN ENDOSCOPIC PROCEDURE TODAY AT [facility]:   Refer to the procedure report that was given to you for any specific questions about what was found during the examination.  If the procedure report does not answer your questions, please call your gastroenterologist to clarify.  If you requested that your care partner not be given the details of your procedure findings, then the procedure report has been included in a sealed envelope for you to review at your convenience later.  YOU SHOULD EXPECT: Some feelings of bloating in the abdomen. Passage of more gas than usual.  Walking can help get rid of the air that was put into your GI tract during the procedure and reduce the bloating. If you had a lower endoscopy (such as a colonoscopy or flexible sigmoidoscopy) you may notice spotting of blood in your stool or on the toilet paper. If you underwent a bowel prep for your procedure, you may not have a normal bowel movement for a few days.  Please Note:  You might notice some irritation and congestion in your nose or some drainage.  This is from the oxygen used during your procedure.  There is no need for concern and it should clear up in a day or so.  SYMPTOMS TO REPORT IMMEDIATELY:    Following upper endoscopy (EGD)  Vomiting of blood or coffee ground material  New chest pain or pain under the shoulder blades  Painful or persistently difficult swallowing  New shortness of breath  Fever of 100F or higher  Black, tarry-looking stools  For urgent or emergent issues, a gastroenterologist can be reached at any hour by calling [phone]  [phone].   DIET: Your first meal following the procedure should be a small meal and then it is ok to progress to your normal diet. Heavy or fried foods are harder to digest and may make you feel nauseous or bloated.  Likewise, meals heavy in dairy and vegetables can increase bloating.  Drink plenty of fluids but you should avoid alcoholic beverages for 24 hours.  ACTIVITY:  You should plan to take it easy for the rest of today and you should NOT DRIVE or use heavy machinery until tomorrow (because of the sedation medicines used during the test).    FOLLOW UP: Our staff will call the number listed on your records the next business day following your procedure to check on you and address any questions or concerns that you may have regarding the information given to you following your procedure. If we do not reach you, we will leave a message.  However, if you are feeling well and you are not experiencing any problems, there is no need to return our call.  We will assume that you have returned to your regular daily activities without incident.  If any biopsies were taken you will be contacted by phone or by letter within the next 1-3 weeks.  Please call us at [phone] if you have not heard about the biopsies in 3 weeks.    SIGNATURES/CONFIDENTIALITY: You and/or your care partner have signed paperwork which will be entered into your electronic  medical record.  These signatures attest to the fact that that the information above on your After Visit Summary has been reviewed and is understood.  Full responsibility of the confidentiality of this discharge information lies with you and/or your care-partner.  No NSAIDs, naproxen, aleve, ibuprofen, motrin  Continue protonix daily, 30 mins before breakfast.

## 2016-02-28 ENCOUNTER — Encounter: Payer: Self-pay | Admitting: *Deleted

## 2016-02-28 ENCOUNTER — Telehealth: Payer: Self-pay | Admitting: *Deleted

## 2016-02-28 NOTE — Telephone Encounter (Signed)
Erroneous

## 2016-02-28 NOTE — Telephone Encounter (Signed)
  Follow up Call-  Call back number 02/27/2016  Post procedure Call Back phone  # [phone]  Permission to leave phone message Yes     Patient questions:  Do you have a fever, pain , or abdominal swelling? No. Pain Score  0 *  Have you tolerated food without any problems? Yes.    Have you been able to return to your normal activities? Yes.    Do you have any questions about your discharge instructions: Diet   No. Medications  No. Follow up visit  No.  Do you have questions or concerns about your Care? No.  Actions: * If pain score is 4 or above: No action needed, pain <4.

## 2016-02-29 ENCOUNTER — Encounter: Payer: Self-pay | Admitting: Family Medicine

## 2016-02-29 DIAGNOSIS — M25531 Pain in right wrist: Secondary | ICD-10-CM | POA: Diagnosis not present

## 2016-03-02 NOTE — Progress Notes (Signed)
Quick Note:  Biopsies show inflammation likely due to Aleve  Needs to avoid that and stay on acid-blocking medication (PPI)  He can see me if not better by July  [facility] no letter/recall ______

## 2016-03-06 DIAGNOSIS — M25531 Pain in right wrist: Secondary | ICD-10-CM | POA: Diagnosis not present

## 2016-03-08 ENCOUNTER — Encounter: Payer: Self-pay | Admitting: Family Medicine

## 2016-03-12 DIAGNOSIS — G8929 Other chronic pain: Secondary | ICD-10-CM | POA: Diagnosis not present

## 2016-03-12 DIAGNOSIS — M79609 Pain in unspecified limb: Secondary | ICD-10-CM | POA: Diagnosis not present

## 2016-03-12 DIAGNOSIS — M791 Myalgia: Secondary | ICD-10-CM | POA: Diagnosis not present

## 2016-03-20 ENCOUNTER — Telehealth: Payer: Self-pay | Admitting: Internal Medicine

## 2016-03-20 NOTE — Telephone Encounter (Signed)
Patient misspoke.  He is asking about protonix.  He is advised that he should stay on protonix until he sees Dr. [name] in [place]

## 2016-03-26 DIAGNOSIS — M2042 Other hammer toe(s) (acquired), left foot: Secondary | ICD-10-CM | POA: Diagnosis not present

## 2016-03-26 DIAGNOSIS — L97521 Non-pressure chronic ulcer of other part of left foot limited to breakdown of skin: Secondary | ICD-10-CM | POA: Diagnosis not present

## 2016-03-26 DIAGNOSIS — L84 Corns and callosities: Secondary | ICD-10-CM | POA: Diagnosis not present

## 2016-03-26 DIAGNOSIS — L97529 Non-pressure chronic ulcer of other part of left foot with unspecified severity: Secondary | ICD-10-CM | POA: Diagnosis not present

## 2016-04-11 DIAGNOSIS — R5381 Other malaise: Secondary | ICD-10-CM | POA: Diagnosis not present

## 2016-04-11 DIAGNOSIS — M25561 Pain in right knee: Secondary | ICD-10-CM | POA: Diagnosis not present

## 2016-04-11 DIAGNOSIS — Z79899 Other long term (current) drug therapy: Secondary | ICD-10-CM | POA: Diagnosis not present

## 2016-04-11 DIAGNOSIS — M79642 Pain in left hand: Secondary | ICD-10-CM | POA: Diagnosis not present

## 2016-04-11 DIAGNOSIS — M79641 Pain in right hand: Secondary | ICD-10-CM | POA: Diagnosis not present

## 2016-04-11 DIAGNOSIS — M25511 Pain in right shoulder: Secondary | ICD-10-CM | POA: Diagnosis not present

## 2016-04-11 DIAGNOSIS — M2550 Pain in unspecified joint: Secondary | ICD-10-CM | POA: Diagnosis not present

## 2016-04-11 LAB — COMPLETE METABOLIC PANEL WITH GFR
ALK PHOS: 88 U/L
ALT: 13
AST: 16 U/L
Bilirubin, Total: 0.6

## 2016-04-11 LAB — CREATININE: Creat: 1.2

## 2016-04-12 LAB — FERRITIN: FERRITIN: 27

## 2016-04-12 LAB — HEPATITIS PANEL, ACUTE
HEP A IGM: NEGATIVE
Hep B C IgM: NEGATIVE
Hepatitis B Surface Ag: NEGATIVE
Hepatitis C Ab: NEGATIVE

## 2016-04-12 LAB — CYCLIC CITRUL PEPTIDE ANTIBODY, IGG

## 2016-04-12 LAB — MAGNESIUM: MAGNESIUM: 2.1

## 2016-04-12 LAB — IRON AND TIBC
%SAT: 8
Iron: 27

## 2016-04-12 LAB — RHEUMATOID FACTOR

## 2016-04-14 HISTORY — PX: TOE AMPUTATION: SHX809

## 2016-04-18 ENCOUNTER — Encounter: Payer: Self-pay | Admitting: Family Medicine

## 2016-04-18 ENCOUNTER — Telehealth: Payer: Self-pay | Admitting: Family Medicine

## 2016-04-18 NOTE — Telephone Encounter (Signed)
Pt called stating he saw his orthopedic dr  Last week they stated he labs were abnormal and they wanted him to be see before his new patient appointment 8/1.  He said they were going to fax over the lab results to you.  His iron  Was 22.  He wanted to know if he could schedule an appointment sooner than 8/1

## 2016-04-18 NOTE — Telephone Encounter (Signed)
May schedule in earlier 15 min slot.  I also see he has f/u with Dr [name] next week - keep that appointment. Low iron could be from blood loss from known gastritis/duodenitis on recent EGD.

## 2016-04-19 DIAGNOSIS — M25531 Pain in right wrist: Secondary | ICD-10-CM | POA: Diagnosis not present

## 2016-04-19 NOTE — Telephone Encounter (Signed)
Message left for patient to return my call.  

## 2016-04-20 NOTE — Telephone Encounter (Signed)
Pt cb, I scheduled 15 min appt for Mon 7/10 at 2pm.

## 2016-04-23 ENCOUNTER — Encounter: Payer: Self-pay | Admitting: Family Medicine

## 2016-04-23 ENCOUNTER — Ambulatory Visit (INDEPENDENT_AMBULATORY_CARE_PROVIDER_SITE_OTHER): Payer: PPO | Admitting: Family Medicine

## 2016-04-23 VITALS — BP 112/64 | HR 66 | Temp 98.4°F | Ht 71.5 in | Wt 218.5 lb

## 2016-04-23 DIAGNOSIS — K219 Gastro-esophageal reflux disease without esophagitis: Secondary | ICD-10-CM | POA: Insufficient documentation

## 2016-04-23 DIAGNOSIS — M1990 Unspecified osteoarthritis, unspecified site: Secondary | ICD-10-CM

## 2016-04-23 DIAGNOSIS — M79606 Pain in leg, unspecified: Secondary | ICD-10-CM

## 2016-04-23 DIAGNOSIS — M150 Primary generalized (osteo)arthritis: Secondary | ICD-10-CM | POA: Diagnosis not present

## 2016-04-23 DIAGNOSIS — R519 Headache, unspecified: Secondary | ICD-10-CM

## 2016-04-23 DIAGNOSIS — M159 Polyosteoarthritis, unspecified: Secondary | ICD-10-CM

## 2016-04-23 DIAGNOSIS — K2100 Gastro-esophageal reflux disease with esophagitis, without bleeding: Secondary | ICD-10-CM

## 2016-04-23 DIAGNOSIS — R51 Headache: Secondary | ICD-10-CM

## 2016-04-23 DIAGNOSIS — Z96651 Presence of right artificial knee joint: Secondary | ICD-10-CM | POA: Diagnosis not present

## 2016-04-23 DIAGNOSIS — D509 Iron deficiency anemia, unspecified: Secondary | ICD-10-CM

## 2016-04-23 DIAGNOSIS — G894 Chronic pain syndrome: Secondary | ICD-10-CM

## 2016-04-23 DIAGNOSIS — F4321 Adjustment disorder with depressed mood: Secondary | ICD-10-CM

## 2016-04-23 DIAGNOSIS — G8929 Other chronic pain: Secondary | ICD-10-CM | POA: Insufficient documentation

## 2016-04-23 DIAGNOSIS — M79604 Pain in right leg: Secondary | ICD-10-CM

## 2016-04-23 DIAGNOSIS — M79601 Pain in right arm: Secondary | ICD-10-CM

## 2016-04-23 DIAGNOSIS — E611 Iron deficiency: Secondary | ICD-10-CM

## 2016-04-23 DIAGNOSIS — K1379 Other lesions of oral mucosa: Secondary | ICD-10-CM

## 2016-04-23 DIAGNOSIS — K2980 Duodenitis without bleeding: Secondary | ICD-10-CM

## 2016-04-23 NOTE — Assessment & Plan Note (Signed)
Complicated by multiple surgeries due to infection, hardware removal, finally knee fusion and rod placement

## 2016-04-23 NOTE — Assessment & Plan Note (Signed)
No records yet available. Discussed with patient anticipated source of low iron - anticipate slow GI loss in setting of recent NSAID induced gastropathy. Will mail iron rich foods in diet handout.

## 2016-04-23 NOTE — Progress Notes (Signed)
Pre visit review using our clinic review tool, if applicable. No additional management support is needed unless otherwise documented below in the visit note. 

## 2016-04-23 NOTE — Assessment & Plan Note (Addendum)
H/o/ remote trauma s/p multiple knee surgeries, infections and revisions latest knee fusion and rod placement.  Poor pain control. States has not tolerated oxycodone before, demerol worked well. requests  Pain clinic referral to help manage chronic conditions.

## 2016-04-23 NOTE — Patient Instructions (Addendum)
Iron rich diet handout provided today.  I will review ortho records when they arrive.  We will recheck spot in back of mouth in August - if unchanged may refer you to ENT. We will refer you to new pain clinic.  Keep follow up appointment next month.

## 2016-04-23 NOTE — Assessment & Plan Note (Signed)
Prolonged grieving. Appreciate support provided by counselors. Continue prozac, no SI/HI.

## 2016-04-23 NOTE — Progress Notes (Signed)
BP 112/64 mmHg  Pulse 66  Temp(Src) 98.4 F (36.9 C) (Oral)  Ht 5' 11.5" (1.816 m)  Wt 218 lb 8 oz (99.111 kg)  BMI 30.05 kg/m2   CC: new pt - discuss iron levels  Subjective:    Patient ID: Nicholas [name], male    DOB: Mar 14, 1951, 65 y.o.   MRN: [id number]  HPI: Nicholas [name] is a 65 y.o. 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 Comes in today as acute visit to discuss iron levels - recently found to have low levels by ortho (iron 25). Endorses some IDA. I have not received records of this yet.  He states he was told by ortho there is progression of degenerative arthritis - started at wrists, now affecting forearms/arms. Describes [facility] arthritis pain considering [place] surgery. ?gout and lupus. Saw Dr Estanislado Pandy 7/5. Has seen Dr Grandville Silos Copy at [facility]. Also sees Dr [name] Sa orthopedist.   Has not taken iron pill. Endorses some constipation trouble.   Known gastritis/duodenitis on recent EGD. On protonix daily for this, has f/u with GI later this week. He has stopped anti inflammatories, does not drink alcohol (>70yrs).   On tramadol for chronic diffuse pain. H/o R leg staph infection 2009, steel rod 2010 throughout leg - from hip to ankle. Referred by [facility] to pain management 02/2016. Saw [facility] - prefers not to return. Bad experience. H/o intolerance to oxycodone, hydrocodone.   On topamax for headaches.   Some mild sore throat present for months. Mild mucous drainage as well. Complete dentures.   Lost wife (Dr Marliss Coots patient) 10/2015 ESRD - pt now on prozac, sees grief counselors. No SI/HI.  EGD 02/2016 [name]): - Non-bleeding erosive gastropathy. Biopsied. - Duodenal erosions without bleeding. Biopsied. - Duodenal deformity.  Relevant past medical, surgical, family and social history reviewed and updated as indicated. Interim medical history since our last visit reviewed. Allergies and medications  reviewed and updated. Current Outpatient Prescriptions on File Prior to Visit  Medication Sig  . EPINEPHrine (EPIPEN JR) 0.15 MG/0.3ML injection Inject 0.15 mg into the muscle.  [name] FLUoxetine (PROZAC) 40 MG capsule Take 1 capsule (40 mg total) by mouth daily.  [name] gabapentin (NEURONTIN) 300 MG capsule Take 1 capsule (300 mg total) by mouth 4 (four) times daily.  . hydrocortisone 2.5 % cream Apply 1 application topically at bedtime as needed (for rash).   . pantoprazole (PROTONIX) 40 MG tablet Take 1 tablet (40 mg total) by mouth daily before breakfast.  . Probiotic Product (PROBIOTIC PO) Take 1 tablet by mouth daily.  [name] topiramate (TOPAMAX) 25 MG tablet Take 1 tablet (25 mg total) by mouth 2 (two) times daily.  . Colchicine 0.6 MG CAPS Reported on 04/23/2016  . traMADol (ULTRAM) 50 MG tablet Take 1 tablet (50 mg total) by mouth every 6 (six) hours as needed. (Patient not taking: Reported on 04/23/2016)   No current facility-administered medications on file prior to visit.    Review of Systems Per HPI unless specifically indicated in ROS section     Objective:    BP 112/64 mmHg  Pulse 66  Temp(Src) 98.4 F (36.9 C) (Oral)  Ht 5' 11.5" (1.816 m)  Wt 218 lb 8 oz (99.111 kg)  BMI 30.05 kg/m2  Wt Readings from Last 3 Encounters:  04/23/16 218 lb 8 oz (99.111 kg)  02/27/16 223 lb (101.152 kg)  02/25/16 216 lb 3.2 oz (98.068 kg)  Physical Exam  Constitutional: He appears well-developed and well-nourished. No distress.  HENT:  Mouth/Throat: Uvula is midline and oropharynx is clear and moist. No oropharyngeal exudate, posterior oropharyngeal edema or posterior oropharyngeal erythema.  Nodule noted L tonsillar pillar without significant erythema  Eyes: Conjunctivae and EOM are normal. Pupils are equal, round, and reactive to light.  Neck: Normal range of motion. Neck supple. No thyromegaly present.  Cardiovascular: Normal rate, regular rhythm, normal heart sounds and intact distal pulses.    No murmur heard. Pulmonary/Chest: Effort normal and breath sounds normal. No respiratory distress. He has no wheezes. He has no rales.  Musculoskeletal: He exhibits no edema.  Completely limited ROM R knee due to fusion and rod present. Walks with antalgic cait  Lymphadenopathy:    He has no cervical adenopathy.  Skin: Skin is warm and dry. No rash noted.  Nursing note and vitals reviewed.  Results for orders placed or performed in visit on 02/08/16  Uric acid  Result Value Ref Range   Uric Acid, Serum 5.0 4.0 - 7.8 mg/dL  Cyclic citrul peptide antibody, IgG  Result Value Ref Range   Cyclic Citrullin Peptide Ab <16 Units  ANA  Result Value Ref Range   Anit Nuclear Antibody(ANA) NEG NEGATIVE  Comprehensive metabolic panel  Result Value Ref Range   Sodium 137 135 - 146 mmol/L   Potassium 4.4 3.5 - 5.3 mmol/L   Chloride 104 98 - 110 mmol/L   CO2 19 (L) 20 - 31 mmol/L   Glucose, Bld 100 (H) 65 - 99 mg/dL   BUN 33 (H) 7 - 25 mg/dL   Creat 1.20 0.70 - 1.25 mg/dL   Total Bilirubin 0.9 0.2 - 1.2 mg/dL   Alkaline Phosphatase 213 (H) 40 - 115 U/L   AST 40 (H) 10 - 35 U/L   ALT 32 9 - 46 U/L   Total Protein 7.3 6.1 - 8.1 g/dL   Albumin 3.6 3.6 - 5.1 g/dL   Calcium 9.4 8.6 - 10.3 mg/dL  POCT CBC  Result Value Ref Range   WBC 11.6 (A) 4.6 - 10.2 K/uL   Lymph, poc 1.1 0.6 - 3.4   POC LYMPH PERCENT 9.4 (A) 10 - 50 %L   MID (cbc) 0.4 0 - 0.9   POC MID % 3.2 0 - 12 %M   POC Granulocyte 10.1 (A) 2 - 6.9   Granulocyte percent 87.4 (A) 37 - 80 %G   RBC 4.50 (A) 4.69 - 6.13 M/uL   Hemoglobin 13.6 (A) 14.1 - 18.1 g/dL   HCT, POC 39.0 (A) 43.5 - 53.7 %   MCV 86.6 80 - 97 fL   MCH, POC 30.2 27 - 31.2 pg   MCHC 34.9 31.8 - 35.4 g/dL   RDW, POC 14.1 %   Platelet Count, POC 484 (A) 142 - 424 K/uL   MPV 6.1 0 - 99.8 fL  POCT SEDIMENTATION RATE  Result Value Ref Range   POCT SED RATE 112 (A) 0 - 22 mm/hr      Assessment & Plan:   Problem List Items Addressed This Visit     Osteoarthritis    Followed by ortho and rheum ([facility]).       Relevant Orders   Ambulatory referral to Pain Clinic   History of total right knee replacement    Complicated by multiple surgeries due to infection, hardware removal, finally knee fusion and rod placement      Relevant Orders   Ambulatory referral to  Pain Clinic   Chronic headache    On topamax for chronic pain and headaches. Prior saw [facility] wellness center.      GERD (gastroesophageal reflux disease)   Duodenitis determined by biopsy    Aware to avoid EtOH, NSAIDs, spicy foods.  Continue protonix until f/u with GI.       Low serum iron    No records yet available. Discussed with patient anticipated source of low iron - anticipate slow GI loss in setting of recent NSAID induced gastropathy. Will mail iron rich foods in diet handout.       Chronic pain syndrome    [facility] to Dr [facility] Blanco at [facility]. Pt agrees with plan.       Relevant Orders   Ambulatory referral to Pain Clinic   Inflammatory arthritis [facility])    Will await records from Dr [facility] rheumatology.       Chronic leg pain - Primary    H/o/ remote trauma s/p multiple knee surgeries, infections and revisions latest knee fusion and rod placement.  Poor pain control. States has not tolerated oxycodone before, demerol worked well. requests  Pain clinic referral to help manage chronic conditions.      Relevant Orders   Ambulatory referral to Pain Clinic   Lesion of palate    New - of unsure duration. ?large nodule L soft palate at tonsillar pillars without inflammation/rythema.. Pt states present for last few months at least, endorses mild sore throat but no localizing pain. I asked him to monitor lesion, in enlarging or growing, low threshold to refer to ENT for evaluation. Wears dentures throughout.       Grieving    Prolonged grieving. Appreciate support provided by counselors. Continue prozac, no SI/HI.           Follow up  plan: Return for follow up visit.  [name], MD

## 2016-04-23 NOTE — Assessment & Plan Note (Signed)
refer to Dr [facility] Blanco at [facility]. Pt agrees with plan.

## 2016-04-23 NOTE — Assessment & Plan Note (Signed)
New - of unsure duration. ?large nodule L soft palate at tonsillar pillars without inflammation/rythema.. Pt states present for last few months at least, endorses mild sore throat but no localizing pain. I asked him to monitor lesion, in enlarging or growing, low threshold to refer to ENT for evaluation. Wears dentures throughout.

## 2016-04-23 NOTE — Assessment & Plan Note (Signed)
On topamax for chronic pain and headaches. Prior saw [facility] center.

## 2016-04-23 NOTE — Assessment & Plan Note (Signed)
Aware to avoid EtOH, NSAIDs, spicy foods.  Continue protonix until f/u with GI.

## 2016-04-23 NOTE — Assessment & Plan Note (Signed)
Will await records from Dr [facility] rheumatology.

## 2016-04-23 NOTE — Assessment & Plan Note (Signed)
Followed by ortho and rheum ([facility]).

## 2016-04-24 ENCOUNTER — Encounter: Payer: Self-pay | Admitting: Family Medicine

## 2016-04-27 ENCOUNTER — Encounter: Payer: Self-pay | Admitting: Internal Medicine

## 2016-04-27 ENCOUNTER — Ambulatory Visit (INDEPENDENT_AMBULATORY_CARE_PROVIDER_SITE_OTHER): Payer: PPO | Admitting: Internal Medicine

## 2016-04-27 ENCOUNTER — Other Ambulatory Visit (INDEPENDENT_AMBULATORY_CARE_PROVIDER_SITE_OTHER): Payer: PPO

## 2016-04-27 VITALS — BP 110/62 | HR 73 | Ht 69.0 in | Wt 224.2 lb

## 2016-04-27 DIAGNOSIS — K2981 Duodenitis with bleeding: Secondary | ICD-10-CM | POA: Diagnosis not present

## 2016-04-27 DIAGNOSIS — D62 Acute posthemorrhagic anemia: Secondary | ICD-10-CM

## 2016-04-27 LAB — CBC WITH DIFFERENTIAL/PLATELET
BASOS PCT: 0.4 % (ref 0.0–3.0)
Basophils Absolute: 0 10*3/uL (ref 0.0–0.1)
EOS PCT: 5.2 % — AB (ref 0.0–5.0)
Eosinophils Absolute: 0.4 10*3/uL (ref 0.0–0.7)
HCT: 39.3 % (ref 39.0–52.0)
HEMOGLOBIN: 12.6 g/dL — AB (ref 13.0–17.0)
LYMPHS ABS: 1.3 10*3/uL (ref 0.7–4.0)
Lymphocytes Relative: 15.3 % (ref 12.0–46.0)
MCHC: 32.2 g/dL (ref 30.0–36.0)
MCV: 82.4 fl (ref 78.0–100.0)
MONOS PCT: 12.5 % — AB (ref 3.0–12.0)
Monocytes Absolute: 1 10*3/uL (ref 0.1–1.0)
NEUTROS PCT: 66.6 % (ref 43.0–77.0)
Neutro Abs: 5.5 10*3/uL (ref 1.4–7.7)
Platelets: 262 10*3/uL (ref 150.0–400.0)
RBC: 4.77 Mil/uL (ref 4.22–5.81)
RDW: 15.8 % — AB (ref 11.5–15.5)
WBC: 8.2 10*3/uL (ref 4.0–10.5)

## 2016-04-27 LAB — FERRITIN: Ferritin: 14.8 ng/mL — ABNORMAL LOW (ref 22.0–322.0)

## 2016-04-27 NOTE — Patient Instructions (Signed)
   Please go to the basement and have a complete blood count and a ferritin level checked. We will call results and plans.  Stay on pantoprazole - Rx sent to pharmacy for refills.  I appreciate the opportunity to care for you. Gatha Mayer, MD, [facility]

## 2016-04-27 NOTE — Progress Notes (Signed)
   Subjective:    Patient ID: Nicholas [name], male    DOB: [birth year], 65 y.o.   MRN: [id number] Cc: f/u melena/GOI bleed duodenitis by EGD HPI Doing well from GI standpoint  - no melena - no NSAID Having RLE knee pains -  Has some hoarseness - ?'s GERD Medications, allergies, past medical history, past surgical history, family history and social history are reviewed and updated in the EMR.  Review of Systems As above    Objective:   Physical Exam BP 110/62 mmHg  Pulse 73  Ht 5\' 9"  (1.753 m)  Wt 224 lb 4 oz (101.719 kg)  BMI 33.10 kg/m2     Assessment & Plan:   Encounter Diagnoses  Name Primary?  . Duodenitis with bleeding Yes  . Acute blood loss anemia    Lab Results  Component Value Date   WBC 8.2 04/27/2016   HGB 12.6* 04/27/2016   HCT 39.3 04/27/2016   MCV 82.4 04/27/2016   PLT 262.0 04/27/2016   Lab Results  Component Value Date   FERRITIN 14.8* 04/27/2016   Hoarseness could be allergies - doubt GERD  Labs checked as above  Needs to take ferrous sulfate qd x 6 mos I think F/u PCP re: addl lab tests and stopping ferrous sulfate Stay on PPI See me prn  15 minutes time spent with patient > half in counseling coordination of care  I appreciate the opportunity to care for this patient. [id number] Danise Mina, MD

## 2016-05-02 ENCOUNTER — Encounter: Payer: Self-pay | Admitting: *Deleted

## 2016-05-02 NOTE — Progress Notes (Signed)
Quick Note:  Iron is low Ferrous sulfate 325 mg qd x 6 months F/u pcp as planned See me prn ______

## 2016-05-08 ENCOUNTER — Encounter: Payer: Self-pay | Admitting: Rheumatology

## 2016-05-08 DIAGNOSIS — D509 Iron deficiency anemia, unspecified: Secondary | ICD-10-CM | POA: Diagnosis not present

## 2016-05-08 DIAGNOSIS — Z125 Encounter for screening for malignant neoplasm of prostate: Secondary | ICD-10-CM | POA: Diagnosis not present

## 2016-05-08 DIAGNOSIS — K219 Gastro-esophageal reflux disease without esophagitis: Secondary | ICD-10-CM | POA: Diagnosis not present

## 2016-05-08 DIAGNOSIS — F329 Major depressive disorder, single episode, unspecified: Secondary | ICD-10-CM | POA: Diagnosis not present

## 2016-05-14 DIAGNOSIS — M25512 Pain in left shoulder: Secondary | ICD-10-CM | POA: Diagnosis not present

## 2016-05-14 DIAGNOSIS — M79641 Pain in right hand: Secondary | ICD-10-CM | POA: Diagnosis not present

## 2016-05-14 DIAGNOSIS — M25511 Pain in right shoulder: Secondary | ICD-10-CM | POA: Diagnosis not present

## 2016-05-15 ENCOUNTER — Encounter: Payer: Self-pay | Admitting: *Deleted

## 2016-05-15 ENCOUNTER — Encounter: Payer: Self-pay | Admitting: Family Medicine

## 2016-05-15 ENCOUNTER — Ambulatory Visit (INDEPENDENT_AMBULATORY_CARE_PROVIDER_SITE_OTHER): Payer: PPO | Admitting: Family Medicine

## 2016-05-15 DIAGNOSIS — M1990 Unspecified osteoarthritis, unspecified site: Secondary | ICD-10-CM | POA: Diagnosis not present

## 2016-05-15 DIAGNOSIS — M79601 Pain in right arm: Secondary | ICD-10-CM

## 2016-05-15 DIAGNOSIS — G894 Chronic pain syndrome: Secondary | ICD-10-CM

## 2016-05-15 DIAGNOSIS — K1379 Other lesions of oral mucosa: Secondary | ICD-10-CM

## 2016-05-15 DIAGNOSIS — D509 Iron deficiency anemia, unspecified: Secondary | ICD-10-CM | POA: Diagnosis not present

## 2016-05-15 DIAGNOSIS — G8929 Other chronic pain: Secondary | ICD-10-CM

## 2016-05-15 DIAGNOSIS — M79604 Pain in right leg: Secondary | ICD-10-CM

## 2016-05-15 NOTE — Patient Instructions (Addendum)
Try to find probiotic with 5 billion colony forming units.  I recommend starting iron tablet (ferrous sulfate 325mg  65FE) once daily. Watch for constipation. You are doing well today.  Return as needed or in 3 months for physical.

## 2016-05-15 NOTE — Assessment & Plan Note (Addendum)
Anticipated from h/o duodenitis - will finish current 27mg  ferrous gluconate, then start ferrous sulfate 325mg  (65FE) daily. Recheck levels in 3 months.

## 2016-05-15 NOTE — Progress Notes (Signed)
BP 140/80   Pulse 76   Temp 98.2 F (36.8 C) (Oral)   Ht 5' 11.5" (1.816 m)   Wt 225 lb 4 oz (102.2 kg)   BMI 30.98 kg/m    CC: new pt to establish Subjective:    Patient ID: Nicholas [name], male    DOB: 01/02/1951, 65 y.o.   MRN: [id number]  HPI: Nicholas [name] is a 65 y.o. male presenting on 05/15/2016 for Establish Care   Seen earlier in the month, [facility]. Pending appointment. She also sees Dr Estanislado Pandy who suggested avoiding DMARD at this time. He continues to see Dr Grandville Silos and Dr [name] Sa (Ortho).   Saw GI - recommended iron tablet daily x 6 months then reassess iron levels. Pt to continue PPI daily. He also takes probiotic daily.   Palatine lesion - persistent lesion, mild throat discomfort present as well. Never smoker. Last alcoholic drink was [id number] yrs ago. No fmhx throat cancer.   Recent reassuring stress test.  Relevant past medical, surgical, family and social history reviewed and updated as indicated. Interim medical history since our last visit reviewed. Allergies and medications reviewed and updated. Current Outpatient Prescriptions on File Prior to Visit  Medication Sig  . FLUoxetine (PROZAC) 40 MG capsule Take 1 capsule (40 mg total) by mouth daily.  [name] gabapentin (NEURONTIN) 300 MG capsule Take 1 capsule (300 mg total) by mouth 4 (four) times daily.  . hydrocortisone 2.5 % cream Apply 1 application topically at bedtime as needed (for rash).   . pantoprazole (PROTONIX) 40 MG tablet Take 1 tablet (40 mg total) by mouth daily before breakfast.  . Probiotic Product (PROBIOTIC PO) Take 1 tablet by mouth daily.  [name] topiramate (TOPAMAX) 25 MG tablet Take 1 tablet (25 mg total) by mouth 2 (two) times daily.  . traMADol (ULTRAM) 50 MG tablet Take 1 tablet (50 mg total) by mouth every 6 (six) hours as needed.  . Colchicine 0.6 MG CAPS Reported on 04/23/2016  . EPINEPHrine (EPIPEN JR) 0.15 MG/0.3ML injection Inject 0.15 mg into the  muscle.   No current facility-administered medications on file prior to visit.     Review of Systems Per HPI unless specifically indicated in ROS section     Objective:    BP 140/80   Pulse 76   Temp 98.2 F (36.8 C) (Oral)   Ht 5' 11.5" (1.816 m)   Wt 225 lb 4 oz (102.2 kg)   BMI 30.98 kg/m   Wt Readings from Last 3 Encounters:  05/15/16 225 lb 4 oz (102.2 kg)  04/27/16 224 lb 4 oz (101.7 kg)  04/23/16 218 lb 8 oz (99.1 kg)    Physical Exam  Constitutional: He appears well-developed and well-nourished. No distress.  HENT:  Mouth/Throat: Oropharynx is clear and moist. No oropharyngeal exudate.  Prior nodule at L tonsillar pillar seems to be resolving  Cardiovascular: Normal rate, regular rhythm, normal heart sounds and intact distal pulses.   No murmur heard. Pulmonary/Chest: Effort normal and breath sounds normal. No respiratory distress. He has no wheezes. He has no rales.  Skin: Skin is warm and dry.  Psychiatric: He has a normal mood and affect.  Nursing note and vitals reviewed.  Results for orders placed or performed in visit on 05/02/16  Iron and TIBC  Result Value Ref Range   Iron 27    %SAT 8   Hepatitis panel, acute  Result Value Ref Range  Hepatitis B Surface Ag Negative    Hepatitis C Ab Negative    Hep B C IgM Negative    Hep A IgM Negative   Magnesium  Result Value Ref Range   Magnesium 2.1   Ferritin  Result Value Ref Range   Ferritin 27   Rheumatoid factor  Result Value Ref Range   Rheumatoid Factor (IgA) [id number]   Cyclic citrul peptide antibody, IgG  Result Value Ref Range   CCP Antibodies IgG/IgA <16    Lab Results  Component Value Date   WBC 8.2 04/27/2016   HGB 12.6 (L) 04/27/2016   HCT 39.3 04/27/2016   MCV 82.4 04/27/2016   PLT 262.0 04/27/2016       Assessment & Plan:   Problem List Items Addressed This Visit    Chronic leg pain   Chronic pain syndrome    P[facility] app[facility].       IDA (iron  deficiency anemia)    Anticipated from h/o duodenitis - will finish current 27mg  ferrous gluconate, then start ferrous sulfate 325mg  (65FE) daily. Recheck levels in 3 months.       Relevant Medications   ferrous sulfate 325 (65 FE) MG tablet   Inflammatory arthritis ([place])    Continue f/u with rheum, ortho.       Lesion of palate    Seems to be resolving. Ok to continue to monitor for now.        Other Visit Diagnoses   None.      Follow up plan: Return in about 3 months (around 08/15/2016) for medicare wellness visit.  Nicholas Bush, MD

## 2016-05-15 NOTE — Assessment & Plan Note (Addendum)
Seems to be resolving. Ok to continue to monitor for now.

## 2016-05-15 NOTE — Assessment & Plan Note (Signed)
Pending appt with [facility].

## 2016-05-15 NOTE — Assessment & Plan Note (Signed)
Continue f/u with rheum, ortho.

## 2016-05-17 ENCOUNTER — Telehealth: Payer: Self-pay | Admitting: Family Medicine

## 2016-05-17 NOTE — Telephone Encounter (Signed)
Will await [facility] eval.  

## 2016-05-17 NOTE — Telephone Encounter (Signed)
Patient Name: Nicholas [name]  DOB: Mar 22, 1951    Initial Comment caller states he has vomiting and has urinated   Nurse Assessment  Nurse: Mallie Mussel, RN, Alveta Heimlich Date/Time Eilene Ghazi Time): 05/17/2016 2:37:24 PM  Confirm and document reason for call. If symptomatic, describe symptoms. You must click the next button to save text entered. ---Caller states that he began vomiting this morning. He has vomited x 4-5. He has a headache which began today. He rates his headache pain as 8 on 0-10 scale. It worked its way up to 8. Denies fever and diarrhea. He has been urinating.  Has the patient traveled out of the country within the last 30 days? ---No  Does the patient have any new or worsening symptoms? ---Yes  Will a triage be completed? ---Yes  Related visit to physician within the last 2 weeks? ---No  Does the PT have any chronic conditions? (i.e. diabetes, asthma, etc.) ---Yes  List chronic conditions. ---Neuropathy, Migraines,  Is this a behavioral health or substance abuse call? ---No     Guidelines    Guideline Title Affirmed Question Affirmed Notes  Headache [1] Vomiting AND [2] 2 or more times (Exception: similar to previous migraines)    Final Disposition User   See Physician within 4 Hours (or PCP triage) Mallie Mussel, RN, Alveta Heimlich    Comments  This is different from his previous migraines.   Referrals  [facility]   Disagree/Comply: Comply

## 2016-05-18 NOTE — Telephone Encounter (Signed)
I don't see where he went to [place].  Would offer appt today if not feeling better.

## 2016-05-18 NOTE — Telephone Encounter (Signed)
Lm on pts vm and advised to contact office and schedule an OV if openings are still available

## 2016-05-21 ENCOUNTER — Telehealth: Payer: Self-pay

## 2016-05-21 NOTE — Telephone Encounter (Signed)
Pt left v/m; pharmacist advised pt that diclofenac might help pts pain; pain is all over body and rt leg pain is worse now. Pt wants to know if can take diclofenac and tramadol. [facility] [address]

## 2016-05-22 ENCOUNTER — Other Ambulatory Visit: Payer: Self-pay | Admitting: Family Medicine

## 2016-05-22 NOTE — Telephone Encounter (Signed)
Diclofenac is an NSAID.  Unfortunately with his history of NSAID induced gastropathy I dont' recommend NSAID at this time.  Is he due for tramadol refill?  May add tylenol 500mg  TID scheduled.

## 2016-05-23 NOTE — Telephone Encounter (Signed)
Pt left VM on triage phone requesting call back from clinical staff on Tramadol.  Previously prescibed on 02/25/16 with 2 refills.  Should have tramadol until 05/27/16.  Best number to call pt is [phone]

## 2016-05-24 ENCOUNTER — Other Ambulatory Visit: Payer: Self-pay

## 2016-05-24 ENCOUNTER — Other Ambulatory Visit: Payer: Self-pay | Admitting: Family Medicine

## 2016-05-24 DIAGNOSIS — K269 Duodenal ulcer, unspecified as acute or chronic, without hemorrhage or perforation: Secondary | ICD-10-CM

## 2016-05-24 DIAGNOSIS — K2960 Other gastritis without bleeding: Secondary | ICD-10-CM

## 2016-05-24 MED ORDER — TOPIRAMATE 25 MG PO TABS: 25.0000 mg | ORAL_TABLET | Freq: Two times a day (BID) | ORAL | 0 refills | Status: DC

## 2016-05-24 MED ORDER — GABAPENTIN 300 MG PO CAPS: 300.0000 mg | ORAL_CAPSULE | Freq: Four times a day (QID) | ORAL | 0 refills | Status: DC

## 2016-05-24 MED ORDER — PANTOPRAZOLE SODIUM 40 MG PO TBEC
40.0000 mg | DELAYED_RELEASE_TABLET | Freq: Every day | ORAL | 11 refills | Status: DC
Start: 2016-05-24 — End: 2017-07-12

## 2016-05-24 NOTE — Telephone Encounter (Signed)
Spoke with patient. He needed RF on gabapentin and topamax. Sent in. Also notified him about diclofenac.

## 2016-05-24 NOTE — Telephone Encounter (Signed)
Pt left note requesting status of gabapentin and topamax; spoke with Olivia at [facility] and  They are working on meds now. Pt voiced understanding and will ck with pharmacy later today for pick up.

## 2016-06-07 DIAGNOSIS — M79671 Pain in right foot: Secondary | ICD-10-CM | POA: Diagnosis not present

## 2016-06-19 DIAGNOSIS — D509 Iron deficiency anemia, unspecified: Secondary | ICD-10-CM | POA: Diagnosis not present

## 2016-06-19 DIAGNOSIS — E039 Hypothyroidism, unspecified: Secondary | ICD-10-CM | POA: Diagnosis not present

## 2016-07-11 ENCOUNTER — Ambulatory Visit (INDEPENDENT_AMBULATORY_CARE_PROVIDER_SITE_OTHER): Payer: PPO

## 2016-07-11 ENCOUNTER — Ambulatory Visit (HOSPITAL_COMMUNITY)
Admission: EM | Admit: 2016-07-11 | Discharge: 2016-07-11 | Disposition: A | Discharge status: Home / Self Care | Payer: PPO | Attending: Internal Medicine | Admitting: Internal Medicine

## 2016-07-11 ENCOUNTER — Encounter (HOSPITAL_COMMUNITY): Payer: Self-pay | Admitting: Emergency Medicine

## 2016-07-11 DIAGNOSIS — M7532 Calcific tendinitis of left shoulder: Secondary | ICD-10-CM

## 2016-07-11 DIAGNOSIS — M19012 Primary osteoarthritis, left shoulder: Secondary | ICD-10-CM | POA: Diagnosis not present

## 2016-07-11 MED ORDER — PREDNISONE 10 MG PO TABS: 20.0000 mg | ORAL_TABLET | Freq: Every day | ORAL | 0 refills | Status: DC

## 2016-07-11 NOTE — ED Notes (Signed)
Report from [place], [place]

## 2016-07-11 NOTE — ED Provider Notes (Signed)
CSN: [id number]     Arrival date & time 07/11/16  1518 History   First MD Initiated Contact with Patient 07/11/16 1644     Chief Complaint  Patient presents with  . Arm Pain   (Consider location/radiation/quality/duration/timing/severity/associated sxs/prior Treatment) HPI 65 y/o male with pain in the left arm, states any movement worsens pain, currently sees Dr. [name] Sa. Has been told he needs a "fusion" of his arm. Pt states he is afraid of an operation. States Dr. [name] Sa has been treating his arm pain.  Past Medical History:  Diagnosis Date  . Acute kidney failure 08/2008  . Allergy   . Asthma    ?of this  . Blood transfusion   . CHF (congestive heart failure) ([place])    ?of this  . CLOSTRIDIUM DIFFICILE COLITIS 07/04/2010   Annotation: 12/09, 2/10 Qualifier: Diagnosis of  By: [name], [name]    . Depression with anxiety   . Duodenitis determined by biopsy 02/2016   peptic likely due to aleve (erosive gastropathy with duodenal erosions)  . ECZEMA 07/04/2010   Qualifier: Diagnosis of  By: [name], [name]    . GERD (gastroesophageal reflux disease)   . Heart attack [facility])    2009 personal history of 3  . History of stomach ulcers   . Lower GI bleeding   . MALAR AND MAXILLARY BONES CLOSED FRACTURE 07/04/2010   Annotation: ORIF Qualifier: Diagnosis of  By: [name], [name]    . Migraine    "definitely"  . Personal history of colonic adenoma 06/01/2003  . Pneumonia 08/2008   "while in ICU"  . Seizures ([place])    ?of this  . Stroke [facility])    Past Surgical History:  Procedure Laterality Date  . anterior  nerve transposition  07/2009   left ulnar nerve  . antibiotic spacer exchange  11/2008; 08/2006   right knee  . ARTHROTOMY  08/2008   right knee w/I&D  . COLONOSCOPY  11/2012   diverticulosis, rpt 10 yrs [name])  . ESOPHAGOGASTRODUODENOSCOPY  02/2016   erosive gastropathy with duodenal erosions [name])  . HARDWARE REMOVAL  04/2006   right knee w/antibiotic spacers placed  .  INGUINAL HERNIA REPAIR  early 1990's   bilateral  . JOINT REPLACEMENT    . KNEE ARTHROSCOPY  10/2001   right  . KNEE FUSION  03/2009   right knee removal; antibiotic spacers;   . REPLACEMENT TOTAL KNEE  08/2008; 09/2001   right  . SYNOVECTOMY  06/2005   debridement, liner exchange right knee  . TOE AMPUTATION Left 08/2008   great toe  . TOE AMPUTATION Left 04/2016   2nd toe [name]   Family History  Problem Relation Age of Onset  . Coronary artery disease Father   . Heart disease Mother   . Breast cancer Sister   . Diabetes Brother   . Diabetes Sister   . Colon cancer Neg Hx   . Stomach cancer Neg Hx    Social History  Substance Use Topics  . Smoking status: Never Smoker  . Smokeless tobacco: Never Used  . Alcohol use No     Comment: "I abused alcohol; last drink  ~ 2005"    Review of Systems  Denies: HEADACHE, NAUSEA, ABDOMINAL PAIN, CHEST PAIN, CONGESTION, DYSURIA, SHORTNESS OF BREATH  Allergies  Bee venom; Peanut-containing drug products; Percocet [oxycodone-acetaminophen]; Celebrex [celecoxib]; Latex; and Nsaids  Home Medications   Prior to Admission medications   Medication Sig Start Date End Date Taking? Authorizing Provider  Colchicine 0.6 MG CAPS Reported on 04/23/2016 02/15/16   Historical Provider, MD  doxycycline (VIBRA-TABS) 100 MG tablet Take 100 mg by mouth 2 (two) times daily.    Historical Provider, MD  EPINEPHrine (EPIPEN JR) 0.15 MG/0.3ML injection Inject 0.15 mg into the muscle. 01/10/16   Historical Provider, MD  ferrous sulfate 325 (65 FE) MG tablet Take 325 mg by mouth daily with breakfast.    Historical Provider, MD  FLUoxetine (PROZAC) 40 MG capsule Take 1 capsule (40 mg total) by mouth daily. 02/25/16   Shawnee Knapp, MD  gabapentin (NEURONTIN) 300 MG capsule Take 1 capsule (300 mg total) by mouth 4 (four) times daily. 05/24/16   [name], MD  hydrocortisone 2.5 % cream Apply 1 application topically at bedtime as needed (for rash).  12/13/15    Historical Provider, MD  Multiple Vitamin (MULTIVITAMIN) tablet Take 1 tablet by mouth daily.    Historical Provider, MD  pantoprazole (PROTONIX) 40 MG tablet Take 1 tablet (40 mg total) by mouth daily before breakfast. 05/24/16   Gatha Mayer, MD  Probiotic Product (PROBIOTIC PO) Take 1 tablet by mouth daily.    Historical Provider, MD  topiramate (TOPAMAX) 25 MG tablet Take 1 tablet (25 mg total) by mouth 2 (two) times daily. 05/24/16   [name], MD  traMADol (ULTRAM) 50 MG tablet Take 1 tablet (50 mg total) by mouth every 6 (six) hours as needed. 02/25/16   Shawnee Knapp, MD   Meds Ordered and Administered this Visit  Medications - No data to display  BP 124/75 (BP Location: Right Arm)   Pulse 70   Temp 98.7 F (37.1 C) (Oral)   Resp 18   SpO2 98%  No data found.   Physical Exam NURSES NOTES AND VITAL SIGNS REVIEWED. CONSTITUTIONAL: Well developed, well nourished, no acute distress HEENT: normocephalic, atraumatic EYES: Conjunctiva normal NECK:normal ROM, supple, no adenopathy PULMONARY:No respiratory distress, normal effort ABDOMINAL: Soft, ND, NT BS+, No CVAT MUSCULOSKELETAL: Normal ROM of all extremities, left shoulder has anterior shoulder tenderness. No palpable dislocation.   SKIN: warm and dry without rash PSYCHIATRIC: Mood and affect, behavior are normal  Urgent Care Course   Clinical Course    Procedures (including critical care time)  Labs Review Labs Reviewed - No data to display  Imaging Review Dg Shoulder Left  Result Date: 07/11/2016 CLINICAL DATA:  65 year old who was unable to raise the left arm earlier today. No known injuries. Anterior shoulder pain. EXAM: LEFT SHOULDER - 2+ VIEW COMPARISON:  None. FINDINGS: No evidence of acute or subacute fracture or dislocation. Severe narrowing of the glenohumeral joint space with a large spur arising from the inferior glenoid and the inferior humeral head. Narrowed subacromial space with a subacromial spur.  Acromioclavicular joint intact. IMPRESSION: 1. Severe osteoarthritis involving the glenohumeral joint. 2. Narrowed subacromial space and subacromial spur may indicate chronic supraspinatus tendon disease. Electronically Signed   By: Evangeline Dakin M.D.   On: 07/11/2016 18:25    Discussed with patient prior to discharge.  Visual Acuity Review  Right Eye Distance:   Left Eye Distance:   Bilateral Distance:    Right Eye Near:   Left Eye Near:    Bilateral Near:         MDM   1. Tendonitis, calcific, shoulder, left     Patient is reassured that there are no issues that require transfer to higher level of care at this time or additional tests. Patient is advised to  continue home symptomatic treatment. Patient is advised that if there are new or worsening symptoms to attend the emergency department, contact primary care provider, or return to UC. Instructions of care provided discharged home in stable condition.    THIS NOTE WAS GENERATED USING A VOICE RECOGNITION SOFTWARE PROGRAM. ALL REASONABLE EFFORTS  WERE MADE TO PROOFREAD THIS DOCUMENT FOR ACCURACY.  I have verbally reviewed the discharge instructions with the patient. A printed AVS was given to the patient.  All questions were answered prior to discharge.      Konrad Felix, [place] 07/11/16 [phone]

## 2016-07-11 NOTE — ED Triage Notes (Signed)
The patient presented to the [facility] with a complaint of left arm pain and loss of mobility that started this morning. The patient denied any known injury.

## 2016-07-12 DIAGNOSIS — M25512 Pain in left shoulder: Secondary | ICD-10-CM | POA: Diagnosis not present

## 2016-07-13 DIAGNOSIS — M25512 Pain in left shoulder: Secondary | ICD-10-CM | POA: Diagnosis not present

## 2016-07-17 ENCOUNTER — Other Ambulatory Visit: Payer: Self-pay | Admitting: Family Medicine

## 2016-07-17 NOTE — Telephone Encounter (Signed)
Ok to refill? Last filled 02/25/16 #120 2 RF

## 2016-07-19 ENCOUNTER — Ambulatory Visit (INDEPENDENT_AMBULATORY_CARE_PROVIDER_SITE_OTHER): Payer: PPO | Admitting: Family Medicine

## 2016-07-19 ENCOUNTER — Encounter: Payer: Self-pay | Admitting: Family Medicine

## 2016-07-19 VITALS — BP 136/74 | HR 66 | Temp 98.3°F | Wt 226.5 lb

## 2016-07-19 DIAGNOSIS — M150 Primary generalized (osteo)arthritis: Secondary | ICD-10-CM | POA: Diagnosis not present

## 2016-07-19 DIAGNOSIS — Z96651 Presence of right artificial knee joint: Secondary | ICD-10-CM | POA: Diagnosis not present

## 2016-07-19 DIAGNOSIS — M79604 Pain in right leg: Secondary | ICD-10-CM

## 2016-07-19 DIAGNOSIS — M1990 Unspecified osteoarthritis, unspecified site: Secondary | ICD-10-CM

## 2016-07-19 DIAGNOSIS — E039 Hypothyroidism, unspecified: Secondary | ICD-10-CM

## 2016-07-19 DIAGNOSIS — G894 Chronic pain syndrome: Secondary | ICD-10-CM | POA: Diagnosis not present

## 2016-07-19 DIAGNOSIS — G8929 Other chronic pain: Secondary | ICD-10-CM

## 2016-07-19 DIAGNOSIS — E669 Obesity, unspecified: Secondary | ICD-10-CM | POA: Insufficient documentation

## 2016-07-19 DIAGNOSIS — M159 Polyosteoarthritis, unspecified: Secondary | ICD-10-CM

## 2016-07-19 DIAGNOSIS — D509 Iron deficiency anemia, unspecified: Secondary | ICD-10-CM

## 2016-07-19 LAB — LIPID PANEL
CHOLESTEROL: 109 mg/dL (ref 0–200)
HDL: 42.7 mg/dL (ref >39.00)
LDL Cholesterol: 59 mg/dL (ref 0–99)
NONHDL: 66.5
Total CHOL/HDL Ratio: 3
Triglycerides: 38 mg/dL (ref 0.0–149.0)
VLDL: 7.6 mg/dL (ref 0.0–40.0)

## 2016-07-19 LAB — BASIC METABOLIC PANEL
BUN: 21 mg/dL (ref 6–23)
CHLORIDE: 108 meq/L (ref 96–112)
CO2: 25 meq/L (ref 19–32)
Calcium: 8.3 mg/dL — ABNORMAL LOW (ref 8.4–10.5)
Creatinine, Ser: 1.07 mg/dL (ref 0.40–1.50)
GFR: 73.57 mL/min (ref >60.00)
Glucose, Bld: 96 mg/dL (ref 70–99)
POTASSIUM: 4.1 meq/L (ref 3.5–5.1)
Sodium: 139 mEq/L (ref 135–145)

## 2016-07-19 LAB — TSH: TSH: 0.09 u[IU]/mL — AB (ref 0.35–4.50)

## 2016-07-19 MED ORDER — TRAMADOL HCL 50 MG PO TABS: 50.0000 mg | ORAL_TABLET | Freq: Four times a day (QID) | ORAL | 0 refills | Status: DC | PRN

## 2016-07-19 NOTE — Assessment & Plan Note (Signed)
Regular with iron daily for last 2 months. Will recheck iron panel at CPE.

## 2016-07-19 NOTE — Assessment & Plan Note (Signed)
Update TSH today. 

## 2016-07-19 NOTE — Progress Notes (Signed)
BP 136/74   Pulse 66   Temp 98.3 F (36.8 C) (Oral)   Wt 226 lb 8 oz (102.7 kg)   BMI 31.15 kg/m    CC: med refill visit Subjective:    Patient ID: Nicholas [name], male    DOB: 1951/06/16, 65 y.o.   MRN: [id number]  HPI: Nicholas [name] is a 65 y.o. male presenting on 07/19/2016 for Medication Refill   IDA - on ferrous sulfate 325mg  daily.   Chronic pain syndrome - referred to preferred pain. Saw [facility] last week with calcific tendonitis of L shoulder. Followed by Dr [name] Sa ortho. Pending L shoulder surgery.   Saw Dr Katheren Shams over last few weeks (another PCP) - has appt with new pain clinic 07/30/2016. States he did not receive pain meds from Dr Katheren Shams.   Has been taking tamadol 50mg  QID PRN.   Relevant past medical, surgical, family and social history reviewed and updated as indicated. Interim medical history since our last visit reviewed. Allergies and medications reviewed and updated. Current Outpatient Prescriptions on File Prior to Visit  Medication Sig  . EPINEPHrine (EPIPEN JR) 0.15 MG/0.3ML injection Inject 0.15 mg into the muscle.  . ferrous sulfate 325 (65 FE) MG tablet Take 325 mg by mouth daily with breakfast.  . FLUoxetine (PROZAC) 40 MG capsule Take 1 capsule (40 mg total) by mouth daily.  Nicholas [name] Kitchen gabapentin (NEURONTIN) 300 MG capsule Take 1 capsule (300 mg total) by mouth 4 (four) times daily.  . hydrocortisone 2.5 % cream Apply 1 application topically at bedtime as needed (for rash).   . Multiple Vitamin (MULTIVITAMIN) tablet Take 1 tablet by mouth daily.  . pantoprazole (PROTONIX) 40 MG tablet Take 1 tablet (40 mg total) by mouth daily before breakfast.  . Probiotic Product (PROBIOTIC PO) Take 1 tablet by mouth daily.  Nicholas [name] Kitchen topiramate (TOPAMAX) 25 MG tablet Take 1 tablet (25 mg total) by mouth 2 (two) times daily.   No current facility-administered medications on file prior to visit.     Review of Systems Per HPI unless specifically indicated in ROS section       Objective:    BP 136/74   Pulse 66   Temp 98.3 F (36.8 C) (Oral)   Wt 226 lb 8 oz (102.7 kg)   BMI 31.15 kg/m   Wt Readings from Last 3 Encounters:  07/19/16 226 lb 8 oz (102.7 kg)  05/15/16 225 lb 4 oz (102.2 kg)  04/27/16 224 lb 4 oz (101.7 kg)    Physical Exam  Constitutional: He appears well-developed and well-nourished. No distress.  HENT:  Mouth/Throat: Oropharynx is clear and moist. No oropharyngeal exudate.  Eyes: Conjunctivae and EOM are normal. Pupils are equal, round, and reactive to light. No scleral icterus.  Neck: Normal range of motion. Neck supple.  Cardiovascular: Normal rate, regular rhythm, normal heart sounds and intact distal pulses.   No murmur heard. Pulmonary/Chest: Effort normal and breath sounds normal. No respiratory distress. He has no wheezes. He has no rales.  Musculoskeletal: He exhibits no edema.  Stiff movements  Skin: Skin is warm and dry.  Nursing note and vitals reviewed.  Results for orders placed or performed in visit on 05/15/16  Creatinine  Result Value Ref Range   Creat 1.20   COMPLETE METABOLIC PANEL WITH GFR  Result Value Ref Range   Bilirubin, Total 0.6    Alkaline Phosphatase 88 U/L   AST 16 U/L   ALT 13    Lab Results  Component Value Date   WBC 8.2 04/27/2016   HGB 12.6 (L) 04/27/2016   HCT 39.3 04/27/2016   MCV 82.4 04/27/2016   PLT 262.0 04/27/2016    Lab Results  Component Value Date   TSH 11.258 (H) 01/11/2012       Assessment & Plan:  Pt states he has been seeing 2 PCPs. Advised he needs to choose one - will let us know who he decides to continue seeing.  Problem List Items Addressed This Visit    Chronic leg pain    Awaiting pain clinic referral. He states he has appt with [place] office mid 07/2016.       Chronic pain syndrome - Primary    NCCSRS reviewed - pt not in system.  Tramadol refilled.  Pt states he has been referred to [facility] and has appt mid October.       History of  total right knee replacement   Hypothyroidism    Update TSH today.       Relevant Medications   levothyroxine (SYNTHROID, LEVOTHROID) 75 MCG tablet   Other Relevant Orders   TSH   IDA (iron deficiency anemia)    Regular with iron daily for last 2 months. Will recheck iron panel at CPE.       Inflammatory arthritis    Continue f/u with rheum/ortho      Relevant Medications   traMADol (ULTRAM) 50 MG tablet   Obesity, Class I, BMI 30-34.9    Check labs today.       Relevant Orders   Lipid panel   Basic metabolic panel   Osteoarthritis   Relevant Medications   traMADol (ULTRAM) 50 MG tablet    Other Visit Diagnoses   None.      Follow up plan: No Follow-up on file.  [name], MD

## 2016-07-19 NOTE — Assessment & Plan Note (Signed)
Continue f/u with rheum/ortho

## 2016-07-19 NOTE — Assessment & Plan Note (Signed)
Awaiting pain clinic referral. He states he has appt with [place] office mid 07/2016.

## 2016-07-19 NOTE — Progress Notes (Signed)
Pre visit review using our clinic review tool, if applicable. No additional management support is needed unless otherwise documented below in the visit note. 

## 2016-07-19 NOTE — Assessment & Plan Note (Signed)
Check labs today.

## 2016-07-19 NOTE — Telephone Encounter (Signed)
Discussed at OV today.

## 2016-07-19 NOTE — Assessment & Plan Note (Addendum)
NCCSRS reviewed - pt not in system.  Tramadol refilled.  Pt states he has been referred to [facility] pain clinic and has appt mid October.

## 2016-07-19 NOTE — Patient Instructions (Addendum)
Last hemoglobin was 12.6. Continue iron for now.  I have refilled tramadol. Labs today.  I recommend choosing one primary care doctor - let us know who you decide.  Keep appointment in November.

## 2016-07-21 ENCOUNTER — Other Ambulatory Visit: Payer: Self-pay | Admitting: Family Medicine

## 2016-07-21 MED ORDER — LEVOTHYROXINE SODIUM 50 MCG PO TABS: 50.0000 ug | ORAL_TABLET | Freq: Every day | ORAL | 6 refills | Status: DC

## 2016-07-23 ENCOUNTER — Ambulatory Visit (INDEPENDENT_AMBULATORY_CARE_PROVIDER_SITE_OTHER): Payer: PPO | Admitting: Orthopedic Surgery

## 2016-07-23 DIAGNOSIS — M25512 Pain in left shoulder: Secondary | ICD-10-CM

## 2016-07-23 DIAGNOSIS — M19012 Primary osteoarthritis, left shoulder: Secondary | ICD-10-CM | POA: Diagnosis not present

## 2016-07-24 ENCOUNTER — Other Ambulatory Visit (INDEPENDENT_AMBULATORY_CARE_PROVIDER_SITE_OTHER): Payer: Self-pay | Admitting: Orthopedic Surgery

## 2016-07-24 DIAGNOSIS — M25512 Pain in left shoulder: Secondary | ICD-10-CM

## 2016-07-24 DIAGNOSIS — M19012 Primary osteoarthritis, left shoulder: Secondary | ICD-10-CM

## 2016-07-24 DIAGNOSIS — E039 Hypothyroidism, unspecified: Secondary | ICD-10-CM | POA: Diagnosis not present

## 2016-07-27 ENCOUNTER — Ambulatory Visit
Admission: RE | Admit: 2016-07-27 | Discharge: 2016-07-27 | Disposition: A | Discharge status: Home / Self Care | Payer: PPO | Source: Ambulatory Visit | Attending: Orthopedic Surgery | Admitting: Orthopedic Surgery

## 2016-07-27 DIAGNOSIS — M25512 Pain in left shoulder: Secondary | ICD-10-CM

## 2016-07-27 DIAGNOSIS — M19012 Primary osteoarthritis, left shoulder: Secondary | ICD-10-CM | POA: Diagnosis not present

## 2016-07-30 ENCOUNTER — Other Ambulatory Visit: Payer: Self-pay | Admitting: Orthopedic Surgery

## 2016-07-30 DIAGNOSIS — G8928 Other chronic postprocedural pain: Secondary | ICD-10-CM | POA: Diagnosis not present

## 2016-07-30 DIAGNOSIS — G8929 Other chronic pain: Secondary | ICD-10-CM | POA: Diagnosis not present

## 2016-07-30 DIAGNOSIS — R51 Headache: Secondary | ICD-10-CM | POA: Diagnosis not present

## 2016-07-30 DIAGNOSIS — G894 Chronic pain syndrome: Secondary | ICD-10-CM | POA: Diagnosis not present

## 2016-07-30 DIAGNOSIS — M25511 Pain in right shoulder: Secondary | ICD-10-CM | POA: Diagnosis not present

## 2016-07-30 DIAGNOSIS — F1120 Opioid dependence, uncomplicated: Secondary | ICD-10-CM | POA: Diagnosis not present

## 2016-07-30 DIAGNOSIS — M25569 Pain in unspecified knee: Secondary | ICD-10-CM | POA: Diagnosis not present

## 2016-07-30 DIAGNOSIS — M25512 Pain in left shoulder: Secondary | ICD-10-CM | POA: Diagnosis not present

## 2016-08-12 ENCOUNTER — Other Ambulatory Visit: Payer: Self-pay | Admitting: Family Medicine

## 2016-08-12 DIAGNOSIS — D509 Iron deficiency anemia, unspecified: Secondary | ICD-10-CM

## 2016-08-12 DIAGNOSIS — Z125 Encounter for screening for malignant neoplasm of prostate: Secondary | ICD-10-CM

## 2016-08-13 DIAGNOSIS — R51 Headache: Secondary | ICD-10-CM | POA: Diagnosis not present

## 2016-08-13 DIAGNOSIS — G894 Chronic pain syndrome: Secondary | ICD-10-CM | POA: Diagnosis not present

## 2016-08-13 DIAGNOSIS — F1120 Opioid dependence, uncomplicated: Secondary | ICD-10-CM | POA: Diagnosis not present

## 2016-08-13 DIAGNOSIS — M25511 Pain in right shoulder: Secondary | ICD-10-CM | POA: Diagnosis not present

## 2016-08-13 DIAGNOSIS — G8929 Other chronic pain: Secondary | ICD-10-CM | POA: Diagnosis not present

## 2016-08-13 DIAGNOSIS — M25569 Pain in unspecified knee: Secondary | ICD-10-CM | POA: Diagnosis not present

## 2016-08-13 DIAGNOSIS — G8928 Other chronic postprocedural pain: Secondary | ICD-10-CM | POA: Diagnosis not present

## 2016-08-13 DIAGNOSIS — M25512 Pain in left shoulder: Secondary | ICD-10-CM | POA: Diagnosis not present

## 2016-08-14 NOTE — Pre-Procedure Instructions (Addendum)
MEET DAMIAN  08/14/2016      [id number] - [place], [place] - [address] [address] Phone: [phone] Fax: [phone]  [id number] [place], [address] [place] [address] Phone: [phone] Fax: [phone]    Your procedure is scheduled on Tuesday November 7.  Report to [facility] Admitting at 5:30 A.M.  Call this number if you have problems the morning of surgery:  [phone]   Remember:  Do not eat food or drink liquids after midnight.  Take these medicines the morning of surgery with A SIP OF WATER: fluoxetine (Prozac), gabapentin (neurontin), levothyroxine (Synthroid), morphine (MSIR), Benadryl if needed, pantoprazole (protonix), tramadol (ultram) if needed, topiramate (Topamax)  7 days prior to surgery STOP taking any Aspirin, Aleve, Naproxen, Ibuprofen, Motrin, Advil, Goody's, BC's, all herbal medications, fish oil, and all vitamins    Do not wear jewelry  Do not wear lotions, powders, or colognes, or deoderant.  Men may shave face and neck.  Do not bring valuables to the hospital.  [facility] is not responsible for any belongings or valuables.  Contacts, dentures or bridgework may not be worn into surgery.  Leave your suitcase in the car.  After surgery it may be brought to your room.  For patients admitted to the hospital, discharge time will be determined by your treatment team.  Patients discharged the day of surgery will not be allowed to drive home.    Special instructions:     [place]- Preparing For Surgery  Before surgery, you can play an important role. Because skin is not sterile, your skin needs to be as free of germs as possible. You can reduce the number of germs on your skin by washing with CHG (chlorahexidine gluconate) Soap before surgery.  CHG is an antiseptic cleaner which kills  germs and bonds with the skin to continue killing germs even after washing.  Please do not use if you have an allergy to CHG or antibacterial soaps. If your skin becomes reddened/irritated stop using the CHG.  Do not shave (including legs and underarms) for at least 48 hours prior to first CHG shower. It is OK to shave your face.  Please follow these instructions carefully.   1. Shower the NIGHT BEFORE SURGERY and the MORNING OF SURGERY with CHG.   2. If you chose to wash your hair, wash your hair first as usual with your normal shampoo.  3. After you shampoo, rinse your hair and body thoroughly to remove the shampoo.  4. Use CHG as you would any other liquid soap. You can apply CHG directly to the skin and wash gently with a scrungie or a clean washcloth.   5. Apply the CHG Soap to your body ONLY FROM THE NECK DOWN.  Do not use on open wounds or open sores. Avoid contact with your eyes, ears, mouth and genitals (private parts). Wash genitals (private parts) with your normal soap.  6. Wash thoroughly, paying special attention to the area where your surgery will be performed.  7. Thoroughly rinse your body with warm water from the neck down.  8. DO NOT shower/wash with your normal soap after using and rinsing off the CHG Soap.  9. Pat yourself dry with a CLEAN TOWEL.   10. Wear CLEAN PAJAMAS   11. Place CLEAN SHEETS on your bed the night of  your first shower and DO NOT SLEEP WITH PETS.    Day of Surgery: Do not apply any deodorants/lotions. Please wear clean clothes to the hospital/surgery center.      Please read over the following fact sheets that you were given. MRSA Information

## 2016-08-15 ENCOUNTER — Ambulatory Visit (HOSPITAL_COMMUNITY)
Admission: RE | Admit: 2016-08-15 | Discharge: 2016-08-15 | Disposition: A | Discharge status: Home / Self Care | Payer: PPO | Source: Ambulatory Visit | Attending: Orthopedic Surgery | Admitting: Orthopedic Surgery

## 2016-08-15 ENCOUNTER — Encounter (HOSPITAL_COMMUNITY): Payer: Self-pay

## 2016-08-15 ENCOUNTER — Encounter (HOSPITAL_COMMUNITY)
Admission: RE | Admit: 2016-08-15 | Discharge: 2016-08-15 | Disposition: A | Discharge status: Home / Self Care | Payer: PPO | Source: Ambulatory Visit | Attending: Orthopedic Surgery | Admitting: Orthopedic Surgery

## 2016-08-15 DIAGNOSIS — R918 Other nonspecific abnormal finding of lung field: Secondary | ICD-10-CM | POA: Diagnosis not present

## 2016-08-15 DIAGNOSIS — M19012 Primary osteoarthritis, left shoulder: Secondary | ICD-10-CM | POA: Insufficient documentation

## 2016-08-15 DIAGNOSIS — Z01818 Encounter for other preprocedural examination: Secondary | ICD-10-CM | POA: Diagnosis not present

## 2016-08-15 HISTORY — DX: Methicillin resistant Staphylococcus aureus infection, unspecified site: A49.02

## 2016-08-15 HISTORY — DX: Patent foramen ovale: Q21.12

## 2016-08-15 HISTORY — DX: Atrial septal defect: Q21.1

## 2016-08-15 LAB — CBC
HEMATOCRIT: 41 % (ref 39.0–52.0)
Hemoglobin: 13.4 g/dL (ref 13.0–17.0)
MCH: 28.2 pg (ref 26.0–34.0)
MCHC: 32.7 g/dL (ref 30.0–36.0)
MCV: 86.1 fL (ref 78.0–100.0)
Platelets: 186 10*3/uL (ref 150–400)
RBC: 4.76 MIL/uL (ref 4.22–5.81)
RDW: 14.3 % (ref 11.5–15.5)
WBC: 7 10*3/uL (ref 4.0–10.5)

## 2016-08-15 LAB — BASIC METABOLIC PANEL
Anion gap: 6 (ref 5–15)
BUN: 19 mg/dL (ref 6–20)
CHLORIDE: 110 mmol/L (ref 101–111)
CO2: 23 mmol/L (ref 22–32)
Calcium: 8.8 mg/dL — ABNORMAL LOW (ref 8.9–10.3)
Creatinine, Ser: 1.19 mg/dL (ref 0.61–1.24)
GFR calc Af Amer: >60 mL/min (ref >60)
GFR calc non Af Amer: >60 mL/min (ref >60)
GLUCOSE: 87 mg/dL (ref 65–99)
POTASSIUM: 4 mmol/L (ref 3.5–5.1)
Sodium: 139 mmol/L (ref 135–145)

## 2016-08-15 LAB — TYPE AND SCREEN
ABO/RH(D): O POS
Antibody Screen: NEGATIVE

## 2016-08-15 LAB — SURGICAL PCR SCREEN
MRSA, PCR: NEGATIVE
STAPHYLOCOCCUS AUREUS: NEGATIVE

## 2016-08-15 LAB — URINALYSIS, ROUTINE W REFLEX MICROSCOPIC
Bilirubin Urine: NEGATIVE
Glucose, UA: NEGATIVE mg/dL
Hgb urine dipstick: NEGATIVE
KETONES UR: NEGATIVE mg/dL
LEUKOCYTES UA: NEGATIVE
NITRITE: NEGATIVE
PH: 6 (ref 5.0–8.0)
Protein, ur: NEGATIVE mg/dL
SPECIFIC GRAVITY, URINE: 1.018 (ref 1.005–1.030)

## 2016-08-15 NOTE — Progress Notes (Signed)
PCP: Dr. Danise Mina Cardiologist Dr. Einar Gip Last echo and stress test done at [facility] per pt in 2009  Pt unsure whether he has had a heart cath but states he did have a heart attack in 2009.   Pt also reports having a stroke in the past with speech problems, "unable to finish sentence"  Pt with no complaints of chest pain or SOB at this time at PAT appointment.

## 2016-08-16 ENCOUNTER — Other Ambulatory Visit (INDEPENDENT_AMBULATORY_CARE_PROVIDER_SITE_OTHER): Payer: PPO

## 2016-08-16 ENCOUNTER — Encounter (HOSPITAL_COMMUNITY): Payer: Self-pay | Admitting: Vascular Surgery

## 2016-08-16 ENCOUNTER — Encounter (HOSPITAL_COMMUNITY): Payer: Self-pay

## 2016-08-16 DIAGNOSIS — Z125 Encounter for screening for malignant neoplasm of prostate: Secondary | ICD-10-CM

## 2016-08-16 DIAGNOSIS — D509 Iron deficiency anemia, unspecified: Secondary | ICD-10-CM

## 2016-08-16 LAB — PSA: PSA: 1.61 ng/mL (ref 0.10–4.00)

## 2016-08-16 LAB — URINE CULTURE

## 2016-08-16 LAB — IBC PANEL
IRON: 35 ug/dL — AB (ref 42–165)
Saturation Ratios: 12.2 % — ABNORMAL LOW (ref 20.0–50.0)
TRANSFERRIN: 205 mg/dL — AB (ref 212.0–360.0)

## 2016-08-16 LAB — CBC WITH DIFFERENTIAL/PLATELET
Basophils Absolute: 0 10*3/uL (ref 0.0–0.1)
Basophils Relative: 0.5 % (ref 0.0–3.0)
EOS ABS: 0.4 10*3/uL (ref 0.0–0.7)
Eosinophils Relative: 5.4 % — ABNORMAL HIGH (ref 0.0–5.0)
HCT: 40.5 % (ref 39.0–52.0)
HEMOGLOBIN: 13.4 g/dL (ref 13.0–17.0)
Lymphocytes Relative: 24 % (ref 12.0–46.0)
Lymphs Abs: 1.6 10*3/uL (ref 0.7–4.0)
MCHC: 33 g/dL (ref 30.0–36.0)
MCV: 85.7 fl (ref 78.0–100.0)
MONO ABS: 1 10*3/uL (ref 0.1–1.0)
Monocytes Relative: 14.5 % — ABNORMAL HIGH (ref 3.0–12.0)
Neutro Abs: 3.7 10*3/uL (ref 1.4–7.7)
Neutrophils Relative %: 55.6 % (ref 43.0–77.0)
Platelets: 236 10*3/uL (ref 150.0–400.0)
RBC: 4.73 Mil/uL (ref 4.22–5.81)
RDW: 14.9 % (ref 11.5–15.5)
WBC: 6.7 10*3/uL (ref 4.0–10.5)

## 2016-08-16 LAB — FERRITIN: FERRITIN: 27.3 ng/mL (ref 22.0–322.0)

## 2016-08-16 NOTE — Progress Notes (Signed)
Anesthesia chart review: Patient is a 65 year old male scheduled for left total shoulder arthroplasty on 08/21/2016 by Dr. [name] Sa.  History includes nonsmoker, MI 08/2008 (possible demand ischemia), CHF (patient denied; I couldn't find definitive evidence in Epic to support this either and also not listed in Dr. Irven Shelling records), GERD, depression, anxiety, migraines, asthma, hypothyroidism, C. difficile colitis '09 and '10, acute kidney failure (in the setting of right knee infection/septic syndrome) 08/2008, GI bleed/peptic ulcer, asthma (?), CVA (anterior right periopercular region) 08/2008, seizure (remote), right TKA 09/18/01 s/p removal 04/11/06 (MSSA infecftion) with revision 06/25/06 s/p removal and left great toe amputation 09/07/08 s/p right knee removal and knee fusion 03/29/09.   PCP is Dr. [name], last visit 07/19/16. He was aware of patient's upcoming shoulder surgery.  Cardiologist is Dr. Einar Gip with [facility]. First seen 01/10/12 for inpatient evaluation for chest pain that was felt likely musculoskeletal. Out-patient follow-up planned. Last visit 06/01/15 with Neldon Labella, AGNP-C for evaluation of chest pain felt "clearly suggestive of musculoskeletal etiology". PRN cardiology follow-up recommended. (He also saw Dr. Loralie Champagne on 09/01/08 as inpatient consult for elevated troponin (in the setting of MSSA sepsis), likely demand ischemia and for TEE to evaluate for endocarditis [no vegetations, small PFO]).   Meds include ferrous sulfate, Prozac, gabapentin, levothyroxine, MSIR, Protonix, probiotic, Topamax.  BP 137/68   Pulse 61   Temp 36.7 C (Oral)   Resp 18   Ht 5\' 9"  (1.753 m)   Wt 225 lb 4 oz (102.2 kg)   SpO2 99%   BMI 33.26 kg/m   08/15/16 EKG: SB at 51 bpm, low voltage QRS. He denied chest pain and SOB at PAT.  According to [facility] records: - 03/05/12 (outside echo): "normal LVEF. Grade 1 diastolic dysfunction. Moderate LVH."  -  02/19/12 Stress EKG: "Negative for ischemia. 4 min 7.3 METs. Normal BP."  09/18/08 Echo (done during admission for disseminated MSSA sepsis with acute pyelonephritis, septic emboli): SUMMARY - Overall left ventricular systolic function was normal. Left    ventricular ejection fraction was estimated to be 55 %. There    was possible hypokinesis of the basal-mid septal wall.    Regional myocardial contractile function was otherwise    normal. - There was mild aortic root dilatation (40 mm).  09/02/08 TEE (in the setting of right knee infection/septic syndrome): SUMMARY - Overall left ventricular systolic function was normal. Left    ventricular ejection fraction was estimated , range being 60    % to 65 %. There were no left ventricular regional wall    motion abnormalities. - There was trivial aortic valvular regurgitation. There was no    evidence for aortic valve vegetation. - There was mild aortic root dilatation. - There was mild mitral valvular regurgitation. There was no    evidence for mitral valvular vegetation. - There was no left atrial appendage thrombus identified. There was    a patent foramen ovale. - There was no evidence for pulmonic valve vegetation. - There was no evidence for tricuspid valve vegetation. - The central venous catheter tip does not enter the right atrium. - There was mild right-to-left shunt with cough by contrast study    with agitated saline. IMPRESSIONS - Normal LV and RV size and systolic function. No valvular    vegetations are noted. The central venous catheter tip was    not visualized (it does not enter the right atrium). - There is a very small PFO.  08/15/16 CXR: IMPRESSION: 1.  Low lung volumes with mild bibasilar infiltrates. 2. Cardiomegaly.  No pulmonary venous congestion.  Preoperative labs noted. Urine culture showed insignificant growth (less than 10,000  colonies).  Reviewed above with anesthesiologist Dr. Conrad [place]. Patient had cardiology follow-up in 2016 with PRN follow-up recommended. [facility] records indicate that he had unremarkable stress and echo in 2013. Patient denied CV symptoms at PAT. If no acute changes then it is anticipated that he can proceed as planned. Of note, he had a small PFO by TEE in 2009.  George Hugh [facility]/Anesthesiology Phone [phone] 08/16/2016 3:25 PM

## 2016-08-20 ENCOUNTER — Telehealth (INDEPENDENT_AMBULATORY_CARE_PROVIDER_SITE_OTHER): Payer: Self-pay | Admitting: Orthopedic Surgery

## 2016-08-20 ENCOUNTER — Other Ambulatory Visit: Payer: Self-pay | Admitting: Family Medicine

## 2016-08-20 NOTE — Telephone Encounter (Signed)
Pt. Called about message below again.

## 2016-08-20 NOTE — Telephone Encounter (Signed)
Patient is having diarrhea and is concerned about having surgery tomorrow.  He is ok right now, but had bad issues this morning.  His toilet is also backed up at his home and he has someone coming to look at it.  He was having to go downstairs in his basement to use the toilet.  He wants to know if you think it's ok to wait for the morning and see if he is better and can do this surgery tomorrow?

## 2016-08-20 NOTE — Telephone Encounter (Signed)
Patient left message requesting a call back regarding a medical matter asap.

## 2016-08-20 NOTE — Telephone Encounter (Signed)
Can you call pt and cancel surgery?

## 2016-08-20 NOTE — Telephone Encounter (Signed)
Cancel surgery for now.

## 2016-08-21 ENCOUNTER — Encounter: Payer: Self-pay | Admitting: Family Medicine

## 2016-08-21 ENCOUNTER — Emergency Department (HOSPITAL_COMMUNITY): Payer: PPO

## 2016-08-21 ENCOUNTER — Inpatient Hospital Stay (HOSPITAL_COMMUNITY): Admission: RE | Admit: 2016-08-21 | Payer: PPO | Source: Ambulatory Visit | Admitting: Orthopedic Surgery

## 2016-08-21 ENCOUNTER — Ambulatory Visit (INDEPENDENT_AMBULATORY_CARE_PROVIDER_SITE_OTHER): Payer: PPO | Admitting: Family Medicine

## 2016-08-21 ENCOUNTER — Encounter (HOSPITAL_COMMUNITY): Admission: RE | Payer: Self-pay | Source: Ambulatory Visit

## 2016-08-21 ENCOUNTER — Telehealth: Payer: Self-pay | Admitting: Family Medicine

## 2016-08-21 ENCOUNTER — Encounter: Payer: PPO | Admitting: Family Medicine

## 2016-08-21 ENCOUNTER — Ambulatory Visit: Payer: PPO | Admitting: Primary Care

## 2016-08-21 ENCOUNTER — Encounter (HOSPITAL_COMMUNITY): Payer: Self-pay | Admitting: Emergency Medicine

## 2016-08-21 ENCOUNTER — Emergency Department (HOSPITAL_COMMUNITY)
Admission: EM | Admit: 2016-08-21 | Discharge: 2016-08-21 | Disposition: A | Discharge status: Home / Self Care | Payer: PPO | Attending: Emergency Medicine | Admitting: Emergency Medicine

## 2016-08-21 DIAGNOSIS — R1084 Generalized abdominal pain: Secondary | ICD-10-CM | POA: Diagnosis not present

## 2016-08-21 DIAGNOSIS — Z96651 Presence of right artificial knee joint: Secondary | ICD-10-CM | POA: Insufficient documentation

## 2016-08-21 DIAGNOSIS — Z91040 Latex allergy status: Secondary | ICD-10-CM | POA: Diagnosis not present

## 2016-08-21 DIAGNOSIS — J45909 Unspecified asthma, uncomplicated: Secondary | ICD-10-CM | POA: Diagnosis not present

## 2016-08-21 DIAGNOSIS — Z8673 Personal history of transient ischemic attack (TIA), and cerebral infarction without residual deficits: Secondary | ICD-10-CM | POA: Diagnosis not present

## 2016-08-21 DIAGNOSIS — E039 Hypothyroidism, unspecified: Secondary | ICD-10-CM | POA: Insufficient documentation

## 2016-08-21 DIAGNOSIS — K5730 Diverticulosis of large intestine without perforation or abscess without bleeding: Secondary | ICD-10-CM | POA: Diagnosis not present

## 2016-08-21 DIAGNOSIS — A09 Infectious gastroenteritis and colitis, unspecified: Secondary | ICD-10-CM

## 2016-08-21 DIAGNOSIS — R1011 Right upper quadrant pain: Secondary | ICD-10-CM | POA: Diagnosis not present

## 2016-08-21 DIAGNOSIS — Z91010 Allergy to peanuts: Secondary | ICD-10-CM | POA: Insufficient documentation

## 2016-08-21 DIAGNOSIS — R197 Diarrhea, unspecified: Secondary | ICD-10-CM | POA: Insufficient documentation

## 2016-08-21 DIAGNOSIS — I509 Heart failure, unspecified: Secondary | ICD-10-CM | POA: Insufficient documentation

## 2016-08-21 LAB — URINALYSIS, ROUTINE W REFLEX MICROSCOPIC
Bilirubin Urine: NEGATIVE
GLUCOSE, UA: NEGATIVE mg/dL
HGB URINE DIPSTICK: NEGATIVE
Ketones, ur: 15 mg/dL — AB
Leukocytes, UA: NEGATIVE
Nitrite: NEGATIVE
Protein, ur: NEGATIVE mg/dL
SPECIFIC GRAVITY, URINE: 1.024 (ref 1.005–1.030)
pH: 6 (ref 5.0–8.0)

## 2016-08-21 LAB — COMPREHENSIVE METABOLIC PANEL
ALBUMIN: 4.1 g/dL (ref 3.5–5.0)
ALT: 15 U/L — AB (ref 17–63)
AST: 22 U/L (ref 15–41)
Alkaline Phosphatase: 90 U/L (ref 38–126)
Anion gap: 6 (ref 5–15)
BUN: 18 mg/dL (ref 6–20)
CHLORIDE: 112 mmol/L — AB (ref 101–111)
CO2: 22 mmol/L (ref 22–32)
CREATININE: 1.16 mg/dL (ref 0.61–1.24)
Calcium: 9.5 mg/dL (ref 8.9–10.3)
GFR calc non Af Amer: >60 mL/min (ref >60)
GLUCOSE: 103 mg/dL — AB (ref 65–99)
Potassium: 4.3 mmol/L (ref 3.5–5.1)
SODIUM: 140 mmol/L (ref 135–145)
Total Bilirubin: 2.2 mg/dL — ABNORMAL HIGH (ref 0.3–1.2)
Total Protein: 7.5 g/dL (ref 6.5–8.1)

## 2016-08-21 LAB — C DIFFICILE QUICK SCREEN W PCR REFLEX
C Diff antigen: NEGATIVE
C Diff interpretation: NOT DETECTED
C Diff toxin: NEGATIVE

## 2016-08-21 LAB — CBC
HCT: 43.2 % (ref 39.0–52.0)
HEMOGLOBIN: 14.6 g/dL (ref 13.0–17.0)
MCH: 28.6 pg (ref 26.0–34.0)
MCHC: 33.8 g/dL (ref 30.0–36.0)
MCV: 84.7 fL (ref 78.0–100.0)
PLATELETS: 246 10*3/uL (ref 150–400)
RBC: 5.1 MIL/uL (ref 4.22–5.81)
RDW: 14 % (ref 11.5–15.5)
WBC: 8.8 10*3/uL (ref 4.0–10.5)

## 2016-08-21 LAB — LIPASE, BLOOD: LIPASE: 60 U/L — AB (ref 11–51)

## 2016-08-21 SURGERY — ARTHROPLASTY, SHOULDER, TOTAL
Anesthesia: General | Site: Shoulder | Laterality: Left

## 2016-08-21 MED ORDER — IOPAMIDOL (ISOVUE-300) INJECTION 61%
INTRAVENOUS | Status: AC
  Administered 2016-08-21: 100 mL
  Filled 2016-08-21: qty 100

## 2016-08-21 MED ORDER — MORPHINE SULFATE (PF) 4 MG/ML IV SOLN
4.0000 mg | Freq: Once | INTRAVENOUS | Status: AC
  Administered 2016-08-21: 4 mg via INTRAVENOUS
  Filled 2016-08-21: qty 1

## 2016-08-21 MED ORDER — SODIUM CHLORIDE 0.9 % IV BOLUS (SEPSIS)
1000.0000 mL | Freq: Once | INTRAVENOUS | Status: AC
  Administered 2016-08-21: 1000 mL via INTRAVENOUS

## 2016-08-21 MED ORDER — DICYCLOMINE HCL 10 MG PO CAPS
10.0000 mg | ORAL_CAPSULE | Freq: Once | ORAL | Status: AC
  Administered 2016-08-21: 10 mg via ORAL
  Filled 2016-08-21: qty 1

## 2016-08-21 MED ORDER — MORPHINE SULFATE (PF) 4 MG/ML IV SOLN
4.0000 mg | Freq: Once | INTRAVENOUS | Status: AC
Start: 2016-08-21 — End: 2016-08-21
  Administered 2016-08-21: 4 mg via INTRAVENOUS
  Filled 2016-08-21: qty 1

## 2016-08-21 NOTE — ED Notes (Addendum)
Pt has a Hx of CDiff. Pt has had diarrhea since Sunday  11-5, Stool at first was dark brown and well formed. But since yesterday the stool has been yellow and watery. Bedside commode in the room.

## 2016-08-21 NOTE — Progress Notes (Signed)
Subjective:  Patient ID: Nicholas [name], male    DOB: 1951-06-21  Age: 65 y.o. MRN: [id number]  CC: Diarrhea  HPI:  65 year old male presents with complaints of diarrhea.  Patient states that he developed diarrhea on Sunday. He states that he's having yellow/loose, and foul-smelling stool. Diarrhea has been worsening. No new exposures or new foods. He has visited someone in the hospital recently. Patient is felt feverish but did not have a fever at home. He reports that he feels poorly and weak. He's been drinking water and ginger ale to try and increase his volume intake. He reports associated nausea and lower abdominal pain. No recent antibiotics. Patient states that he does have a history of C. difficile and this feels similar to that. No vomiting. No other associated symptoms.  Social Hx   Social History   Social History  . Marital status: Married    Spouse name: N/A  . Number of children: 2  . Years of education: 13   Occupational History  . retired    Social History Main Topics  . Smoking status: Never Smoker  . Smokeless tobacco: Never Used  . Alcohol use No     Comment: "I abused alcohol; last drink  ~ 2005"  . Drug use: No  . Sexual activity: No   Other Topics Concern  . None   Social History Narrative   Widower 2016 - wife passed from ESRD related illness   Occ: landscaping   Edu: HS    Review of Systems  Constitutional: Negative for fever.  Gastrointestinal: Positive for abdominal pain, diarrhea and nausea. Negative for vomiting.  Neurological: Positive for weakness.   Objective:  BP 116/80 (BP Location: Right Arm, Patient Position: Sitting, Cuff Size: Normal)   Pulse 75   Temp 98.1 F (36.7 C) (Oral)   Resp 14   Wt 218 lb 2 oz (98.9 kg)   SpO2 95%   BMI 32.21 kg/m   BP/Weight 08/21/2016 08/15/2016 [id number]  Systolic BP [id number]  Diastolic BP 80 68 74  Wt. (Lbs) 218.13 225.25 226.5  BMI 32.21 33.26 31.15   Physical Exam  Constitutional:    Appears ill.   Cardiovascular: Normal rate and regular rhythm.   Pulmonary/Chest: Effort normal and breath sounds normal.  Abdominal: Soft.  Markedly tender to palpation in the lower abdomen, particularly the right lower quadrant.  Neurological: He is alert.  Psychiatric: He has a normal mood and affect.  Vitals reviewed.  Lab Results  Component Value Date   WBC 6.7 08/16/2016   HGB 13.4 08/16/2016   HCT 40.5 08/16/2016   PLT 236.0 08/16/2016   GLUCOSE 87 08/15/2016   CHOL 109 07/19/2016   TRIG 38.0 07/19/2016   HDL 42.70 07/19/2016   LDLCALC 59 07/19/2016   ALT 13 04/11/2016   AST 16 04/11/2016   NA 139 08/15/2016   K 4.0 08/15/2016   CL 110 08/15/2016   CREATININE 1.19 08/15/2016   BUN 19 08/15/2016   CO2 23 08/15/2016   TSH 0.09 (L) 07/19/2016   PSA 1.61 08/16/2016   INR 2.6 (H) 04/03/2009   HGBA1C 5.6 07/13/2010    Assessment & Plan:   Problem List Items Addressed This Visit    Diarrhea    New acute problem. Has a history of C diff. Appears ill and dry/dehydrated.  Additionally, is having significant lower abdominal pain. We discussed outpatient workup versus going to the hospital for a quicker evaluation including C. difficile PCR. Patient  elected the latter. Patient going by private vehicle to the hospital [facility])        Follow-up: PRN  [place]

## 2016-08-21 NOTE — ED Notes (Signed)
Patient transported to US 

## 2016-08-21 NOTE — ED Notes (Signed)
Patient returned from CT and placed back on monitor.  

## 2016-08-21 NOTE — Assessment & Plan Note (Signed)
New acute problem. Has a history of C diff. Appears ill and dry/dehydrated.  Additionally, is having significant lower abdominal pain. We discussed outpatient workup versus going to the hospital for a quicker evaluation including C. difficile PCR. Patient elected the latter. Patient going by private vehicle to the hospital [facility])

## 2016-08-21 NOTE — ED Notes (Signed)
MD at bedside. 

## 2016-08-21 NOTE — Telephone Encounter (Signed)
[facility]  Patient Name: Nicholas [name]  DOB: Jan 16, 1951    Initial Comment pt is going to need a script called in for dehydration-- he has been having diarhea   Nurse Assessment  Nurse: Wayne Sever, RN, Tillie Rung Date/Time (Eastern Time): 08/21/2016 10:56:19 AM  Confirm and document reason for call. If symptomatic, describe symptoms. You must click the next button to save text entered. ---Caller states he has been having diarrhea. Caller states he feels dehydrated. Caller states he was suppose to have shoulder reconstruction this morning and he had to cancel it because of the diarrhea. Caller is c/o dizziness.  Has the patient traveled out of the country within the last 30 days? ---No  Does the patient have any new or worsening symptoms? ---Yes  Will a triage be completed? ---Yes  Related visit to physician within the last 2 weeks? ---No  Does the PT have any chronic conditions? (i.e. diabetes, asthma, etc.) ---Yes  List chronic conditions. ---Pain Issues  Is this a behavioral health or substance abuse call? ---No     Guidelines    Guideline Title Affirmed Question Affirmed Notes  Diarrhea [1] SEVERE diarrhea (e.g., 7 or more times / day more than normal) AND [2] age > 49 years    Final Disposition User   See Physician within 4 Hours (or PCP triage) Wayne Sever, RN, Tillie Rung    Referrals  [place] UNDECIDED   Disagree/Comply: Comply

## 2016-08-21 NOTE — Telephone Encounter (Signed)
Pt scheduled appt with Allie Bossier NP 08/21/16 at 12:30.

## 2016-08-21 NOTE — Telephone Encounter (Signed)
I talked to the pt. And cancelled his surgery for now.  He wants to wait until after the first of the year to reschedule his surgery.  He wants to make sure his sickness is gone.  He will call us when he is ready to reschedule.

## 2016-08-21 NOTE — Patient Instructions (Signed)
Patient with lower abdominal pain, diarrhea and history of C diff.  Does not appear well and is dry.  Needs labs, IV fluids, C diff PCR and possible CT.  Thank you   Dr. Lacinda Axon

## 2016-08-21 NOTE — Progress Notes (Signed)
Pre visit review using our clinic review tool, if applicable. No additional management support is needed unless otherwise documented below in the visit note. 

## 2016-08-21 NOTE — ED Triage Notes (Signed)
Pt states he was supposed to have shoulder surgery today but they canceled it due to pt being sick. Pt c/o diarrhea twice, states hes had C diff before. Pt states its very watery. Also endorses abdominal pain since Sunday. Denies N/V. Pt in NAD.

## 2016-08-21 NOTE — Telephone Encounter (Signed)
Noted. Patient did not show up for appointment.

## 2016-08-21 NOTE — ED Provider Notes (Signed)
[facility] DEPT Provider Note   CSN: [id number] Arrival date & time: 08/21/16  1453     History   Chief Complaint Chief Complaint  Patient presents with  . Abdominal Pain    HPI Nicholas [name] is a 65 y.o. male.  Pateint presents with generalized dull cramping abdominal pain that has been constant since Saturday. Since yesterday it has been associated with near-hourly non-bloody diarrhea, reports it is yellow in color. He has had nausea but no vomiting. He denies fevers. No recent travel, no known sick contacts. No recent antibiotic use. Reports he had c diff several years ago while hospitalized in the ICU (that was associated with antibiotic use) and that this feels similar.   The history is provided by the patient and medical records. No language interpreter was used.  Abdominal Pain   This is a new problem. The current episode started more than 2 days ago. The problem occurs constantly. The problem has not changed since onset.Associated with: Unknown. The pain is located in the generalized abdominal region. The quality of the pain is dull and cramping. The pain is at a severity of 8/10. The pain is moderate. Associated symptoms include diarrhea and nausea. Pertinent negatives include fever, flatus, hematochezia, melena, vomiting and dysuria.    Past Medical History:  Diagnosis Date  . Acute kidney failure 08/2008  . Allergy   . Asthma    ?of this  . Blood transfusion   . CHF (congestive heart failure) ([place])    ?of this, pt denies  . CLOSTRIDIUM DIFFICILE COLITIS 07/04/2010   Annotation: 12/09, 2/10 Qualifier: Diagnosis of  By: [name], [name]    . Depression with anxiety   . Duodenitis determined by biopsy 02/2016   peptic likely due to aleve (erosive gastropathy with duodenal erosions)  . ECZEMA 07/04/2010   Qualifier: Diagnosis of  By: [name], [name]    . GERD (gastroesophageal reflux disease)   . Heart attack    08/2008 (likely demand ischemia in the setting of  MSSA sepsis/R TKA infection)  . History of stomach ulcers   . Hypothyroidism   . Lower GI bleeding   . MALAR AND MAXILLARY BONES CLOSED FRACTURE 07/04/2010   Annotation: ORIF Qualifier: Diagnosis of  By: [name], [name]    . Migraine    "definitely"  . MRSA (methicillin resistant Staphylococcus aureus)    in leg, had to place steel rod in leg  . Personal history of colonic adenoma 06/01/2003  . PFO (patent foramen ovale)    small PFO by 08/2008 TEE  . Pneumonia 08/2008   "while in ICU"  . Seizures ([place])    "long time ago"  . Stroke [facility])    unable to complete sentences at times    Patient Active Problem List   Diagnosis Date Noted  . Diarrhea 08/21/2016  . Obesity, Class I, BMI 30-34.9 07/19/2016  . Hypothyroidism   . IDA (iron deficiency anemia) 04/23/2016  . Chronic pain syndrome 04/23/2016  . Inflammatory arthritis 04/23/2016  . Chronic leg pain 04/23/2016  . Lesion of palate 04/23/2016  . Grieving 04/23/2016  . GERD (gastroesophageal reflux disease)   . Duodenitis determined by biopsy 02/13/2016  . Diverticulosis of colon without hemorrhage 01/31/2016  . Chronic headache 01/11/2012  . MRSA (methicillin resistant Staphylococcus aureus) infection 07/04/2010  . DEGENERATIVE DISC DISEASE 07/04/2010  . History of total right knee replacement 07/04/2010  . Osteoarthritis 06/09/2007  . Personal history of colonic adenoma 06/01/2003  Past Surgical History:  Procedure Laterality Date  . anterior  nerve transposition  07/2009   left ulnar nerve  . antibiotic spacer exchange  11/2008; 08/2006   right knee  . ARTHROTOMY  08/2008   right knee w/I&D  . CATARACT EXTRACTION    . COLONOSCOPY  11/2012   diverticulosis, rpt 10 yrs [name])  . ESOPHAGOGASTRODUODENOSCOPY  02/2016   erosive gastropathy with duodenal erosions [name])  . HARDWARE REMOVAL  04/2006   right knee w/antibiotic spacers placed  . INGUINAL HERNIA REPAIR  early 1990's   bilateral  . JOINT REPLACEMENT     . KNEE ARTHROSCOPY  10/2001   right  . KNEE FUSION  03/2009   right knee removal; antibiotic spacers;   . REPLACEMENT TOTAL KNEE  08/2008; 09/2001   right  . SYNOVECTOMY  06/2005   debridement, liner exchange right knee  . TOE AMPUTATION Left 08/2008   great toe  . TOE AMPUTATION Left 04/2016   2nd toe [name]  . TONSILLECTOMY         Home Medications    Prior to Admission medications   Medication Sig Start Date End Date Taking? Authorizing Provider  EPINEPHrine (EPIPEN 2-PAK) 0.3 mg/0.3 mL IJ SOAJ injection Inject 0.3 mg into the muscle once.   Yes Historical Provider, MD  ferrous sulfate 325 (65 FE) MG tablet Take 325 mg by mouth daily with breakfast.   Yes Historical Provider, MD  FLUoxetine (PROZAC) 40 MG capsule Take 1 capsule (40 mg total) by mouth daily. 02/25/16  Yes Shawnee Knapp, MD  gabapentin (NEURONTIN) 300 MG capsule TAKE ONE CAPSULE BY MOUTH 4 TIMES A DAY 08/20/16  Yes [name], MD  hydrocortisone 2.5 % cream Apply 1 application topically at bedtime as needed (for rash).  12/13/15  Yes Historical Provider, MD  levothyroxine (SYNTHROID, LEVOTHROID) 50 MCG tablet Take 1 tablet (50 mcg total) by mouth daily before breakfast. 07/21/16  Yes [name], MD  Multiple Vitamin (MULTIVITAMIN WITH MINERALS) TABS tablet Take 1 tablet by mouth daily. Centrum silver.   Yes Historical Provider, MD  pantoprazole (PROTONIX) 40 MG tablet Take 1 tablet (40 mg total) by mouth daily before breakfast. 05/24/16  Yes Gatha Mayer, MD  Probiotic Product (PROBIOTIC PO) Take 1 capsule by mouth daily.    Yes Historical Provider, MD  topiramate (TOPAMAX) 25 MG tablet TAKE 1 TABLET BY MOUTH TWICE A DAY 08/20/16  Yes [name], MD  traMADol (ULTRAM) 50 MG tablet Take 1 tablet (50 mg total) by mouth every 6 (six) hours as needed. Patient taking differently: Take 50 mg by mouth every 6 (six) hours as needed for moderate pain.  07/19/16   [name], MD    Family History Family  History  Problem Relation Age of Onset  . Coronary artery disease Father   . Heart disease Mother   . Breast cancer Sister   . Diabetes Brother   . Diabetes Sister   . Colon cancer Neg Hx   . Stomach cancer Neg Hx     Social History Social History  Substance Use Topics  . Smoking status: Never Smoker  . Smokeless tobacco: Never Used  . Alcohol use No     Comment: "I abused alcohol; last drink  ~ 2005"     Allergies   Bee venom; Peanut-containing drug products; Celebrex [celecoxib]; Percocet [oxycodone-acetaminophen]; Latex; and Nsaids   Review of Systems Review of Systems  Constitutional: Negative for fever.  HENT: Negative.   Respiratory:  Negative for shortness of breath.   Cardiovascular: Negative for chest pain.  Gastrointestinal: Positive for abdominal pain, diarrhea and nausea. Negative for blood in stool, flatus, hematochezia, melena and vomiting.  Genitourinary: Negative for dysuria.  Musculoskeletal: Negative.   Skin: Negative.   Neurological: Negative.   Hematological: Does not bruise/bleed easily.  Psychiatric/Behavioral: Negative.      Physical Exam Updated Vital Signs BP 132/76   Pulse (!) 59   Temp 98.4 F (36.9 C) (Oral)   Resp 16   SpO2 98%   Physical Exam  Constitutional: He is oriented to person, place, and time. He appears well-developed and well-nourished. No distress.  HENT:  Head: Normocephalic and atraumatic.  Eyes: Conjunctivae and EOM are normal. No scleral icterus.  Neck: Normal range of motion. Neck supple.  Cardiovascular: Normal rate, regular rhythm and normal heart sounds.  Exam reveals no gallop and no friction rub.   No murmur heard. Pulmonary/Chest: Effort normal and breath sounds normal. No respiratory distress. He has no wheezes. He has no rales.  Abdominal: Normal appearance. There is generalized tenderness. There is no rigidity, no rebound, no guarding, no CVA tenderness, no tenderness at McBurney's point and negative  Murphy's sign.  Neurological: He is alert and oriented to person, place, and time.  Skin: Skin is warm and dry. He is not diaphoretic.  Psychiatric: He has a normal mood and affect. His behavior is normal. Judgment and thought content normal.     ED Treatments / Results  Labs (all labs ordered are listed, but only abnormal results are displayed) Labs Reviewed  LIPASE, BLOOD - Abnormal; Notable for the following:       Result Value   Lipase 60 (*)    All other components within normal limits  COMPREHENSIVE METABOLIC PANEL - Abnormal; Notable for the following:    Chloride 112 (*)    Glucose, Bld 103 (*)    ALT 15 (*)    Total Bilirubin 2.2 (*)    All other components within normal limits  URINALYSIS, ROUTINE W REFLEX MICROSCOPIC (NOT AT [facility]) - Abnormal; Notable for the following:    Color, Urine AMBER (*)    Ketones, ur 15 (*)    All other components within normal limits  C DIFFICILE QUICK SCREEN W PCR REFLEX  CBC    EKG  EKG Interpretation None       Radiology Ct Abdomen Pelvis W Contrast  Result Date: 08/21/2016 CLINICAL DATA:  Bilateral lower abdominal pain since Sunday diarrhea EXAM: CT ABDOMEN AND PELVIS WITH CONTRAST TECHNIQUE: Multidetector CT imaging of the abdomen and pelvis was performed using the standard protocol following bolus administration of intravenous contrast. CONTRAST:  119mL ISOVUE-300 IOPAMIDOL (ISOVUE-300) INJECTION 61% COMPARISON:  01/29/2016 FINDINGS: Lower chest: Lung bases demonstrate no acute consolidation or pleural effusion. Mild dependent atelectasis. Borderline enlargement of the heart size. Hepatobiliary: No focal hepatic abnormalities. Previously noted small hypodensity within the anterior liver is not clearly identified on the current exam. No calcified gallstones. No biliary dilatation. Pancreas: Unremarkable. No pancreatic ductal dilatation or surrounding inflammatory changes. Spleen: Normal in size without focal abnormality.  Adrenals/Urinary Tract: Adrenal glands within normal limits. Stable bilateral renal cysts. Other sub cm hypodense lesions within both kidneys, too small to further characterize. Bladder is normal Stomach/Bowel: Stomach is within normal limits. Appendix appears normal. No evidence of bowel wall thickening, distention, or inflammatory changes. Diverticular disease of the sigmoid colon without bowel inflammation. Vascular/Lymphatic: Atherosclerosis of the aorta. Ectatic common iliac artery is left  greater than right. No pathologically enlarged abdominal or pelvic lymph nodes. Reproductive: Prostate is unremarkable. Other: No free air or free fluid Musculoskeletal: No acute osseous abnormality. Multilevel degenerative changes of the spine. IMPRESSION: 1. No CT evidence for acute intra-abdominal or pelvic pathology 2. Sigmoid diverticulosis without evidence for diverticulitis. Electronically Signed   By: Donavan Foil M.D.   On: 08/21/2016 20:19   US Abdomen Limited Ruq  Result Date: 08/21/2016 CLINICAL DATA:  Initial evaluation for acute right upper quadrant pain. EXAM: US ABDOMEN LIMITED - RIGHT UPPER QUADRANT COMPARISON:  Prior CT from earlier the same day. FINDINGS: Gallbladder: No gallstones or wall thickening visualized. No sonographic Murphy sign noted by sonographer. Common bile duct: Diameter: 3.9 mm. Liver: No focal lesion identified. Within normal limits in parenchymal echogenicity. IMPRESSION: Normal right upper quadrant ultrasound. Electronically Signed   By: Jeannine Boga M.D.   On: 08/21/2016 22:44    Procedures Procedures (including critical care time)  Medications Ordered in ED Medications  dicyclomine (BENTYL) capsule 10 mg (10 mg Oral Given 08/21/16 1809)  sodium chloride 0.9 % bolus 1,000 mL (0 mLs Intravenous Stopped 08/21/16 1926)  morphine 4 MG/ML injection 4 mg (4 mg Intravenous Given 08/21/16 1809)  iopamidol (ISOVUE-300) 61 % injection (100 mLs  Contrast Given 08/21/16 1938)    morphine 4 MG/ML injection 4 mg (4 mg Intravenous Given 08/21/16 2107)     Initial Impression / Assessment and Plan / ED Course  I have reviewed the triage vital signs and the nursing notes.  Pertinent labs & imaging results that were available during my care of the patient were reviewed by me and considered in my medical decision making (see chart for details).  Clinical Course     Patient presents with several days of generalized abdominal pain associated with multiple daily episodes of watery diarrhea. He is not immunosuppressed and has not recently been on antibiotics. No recent travel or known sick contacts. He is overall well-appearing with normal vital signs. Does have generalized abdominal tenderness. He is afebrile with no leukocytosis. Labs reveal elevated bilirubin but normal LFTs and no biliary obstruction pattern. UA negative for UTI/pyelonephritis. Stool sample negative for acute C. Diff infection. CT scan revealed diverticulosis but no signs of obstruction, appendicitis, or diverticulitis. Given his elevated bilirubin and RUQ tenderness, ultrasound also obtained which was negative for cholecystitis or cholelithiasis. Suspect symptoms due to nonspecific infectious colitis. His diarrhea had improved while in the ED, symptoms improved after pain control and fluid resuscitation. Was given return precautions for worsening symptoms and expressed understanding. He is in good condition for discharge home.  Final Clinical Impressions(s) / ED Diagnoses   Final diagnoses:  RUQ pain  Generalized abdominal pain  Diarrhea, unspecified type    New Prescriptions Discharge Medication List as of 08/21/2016 10:56 PM       Harlin Heys, MD 08/22/16 1622    Varney Biles, MD 08/23/16 1300

## 2016-08-21 NOTE — ED Notes (Signed)
Patient transported to CT 

## 2016-08-21 NOTE — ED Notes (Signed)
Pt verbalized understanding discharge instructions and denies any further needs or questions at this time. VS stable, ambulatory and steady gait.   

## 2016-08-23 ENCOUNTER — Encounter: Payer: Self-pay | Admitting: *Deleted

## 2016-08-24 ENCOUNTER — Telehealth: Payer: Self-pay

## 2016-08-24 NOTE — Telephone Encounter (Signed)
Recommend tylenol 500-1000mg  BID with meals PRN pain.

## 2016-08-24 NOTE — Telephone Encounter (Signed)
Patient notified. He asked if there was anything he could take for pain. I advised him that he was given #180 tramadol on 07/19/16, which should leave him ~40 tablets. He said he has been so "compromised" that he didn't know where they were in his house. I told him I would check with you about what you recommend.

## 2016-08-24 NOTE — Telephone Encounter (Signed)
Pt was seen at [facility] ED on 08/21/16 with abdominal pain, diarrhea. Pt was checked for c diff which was neg. Pt has been drinking water,gatorade,gingerale and eating bread and applesauce. Pt not having diarrhea now but still has lower abd pain all the way across abd. After the ED visit pt stopped all of his meds (doctor did not tell pt to stop meds pt did this on his own). Pt wants to know since diarrhea has stopped  But still having abd pain should pt resume his routine meds. Pt request cb.

## 2016-08-24 NOTE — Telephone Encounter (Signed)
Recommend restart all meds except for iron tablet for now.  Once abd pain has improved may restart iron tablet.

## 2016-08-27 DIAGNOSIS — M25511 Pain in right shoulder: Secondary | ICD-10-CM | POA: Diagnosis not present

## 2016-08-27 DIAGNOSIS — R51 Headache: Secondary | ICD-10-CM | POA: Diagnosis not present

## 2016-08-27 DIAGNOSIS — G8929 Other chronic pain: Secondary | ICD-10-CM | POA: Diagnosis not present

## 2016-08-27 DIAGNOSIS — M25569 Pain in unspecified knee: Secondary | ICD-10-CM | POA: Diagnosis not present

## 2016-08-27 DIAGNOSIS — F1120 Opioid dependence, uncomplicated: Secondary | ICD-10-CM | POA: Diagnosis not present

## 2016-08-27 DIAGNOSIS — G894 Chronic pain syndrome: Secondary | ICD-10-CM | POA: Diagnosis not present

## 2016-08-27 DIAGNOSIS — G8928 Other chronic postprocedural pain: Secondary | ICD-10-CM | POA: Diagnosis not present

## 2016-08-27 DIAGNOSIS — M25512 Pain in left shoulder: Secondary | ICD-10-CM | POA: Diagnosis not present

## 2016-08-27 NOTE — Telephone Encounter (Signed)
Pt was notified of nstructions, pt verbalized understanding.

## 2016-09-05 ENCOUNTER — Ambulatory Visit (INDEPENDENT_AMBULATORY_CARE_PROVIDER_SITE_OTHER): Payer: PPO | Admitting: Orthopedic Surgery

## 2016-09-05 ENCOUNTER — Encounter (INDEPENDENT_AMBULATORY_CARE_PROVIDER_SITE_OTHER): Payer: Self-pay | Admitting: Orthopedic Surgery

## 2016-09-05 DIAGNOSIS — M19011 Primary osteoarthritis, right shoulder: Secondary | ICD-10-CM

## 2016-09-05 DIAGNOSIS — M19012 Primary osteoarthritis, left shoulder: Secondary | ICD-10-CM

## 2016-09-05 NOTE — Progress Notes (Signed)
I saw Nicholas [name] today today.  He wants to reschedule his shoulder replacement.  Surgery had been canceled because of diarrhea.  Went to the emergency room C. difficile was negative he's doing better from this GI issue    shoulders been continually painful.  Plan at this time is to reschedule total shoulder no charge visit today

## 2016-09-10 DIAGNOSIS — M25569 Pain in unspecified knee: Secondary | ICD-10-CM | POA: Diagnosis not present

## 2016-09-10 DIAGNOSIS — G894 Chronic pain syndrome: Secondary | ICD-10-CM | POA: Diagnosis not present

## 2016-09-10 DIAGNOSIS — M25512 Pain in left shoulder: Secondary | ICD-10-CM | POA: Diagnosis not present

## 2016-09-10 DIAGNOSIS — F1120 Opioid dependence, uncomplicated: Secondary | ICD-10-CM | POA: Diagnosis not present

## 2016-09-10 DIAGNOSIS — R51 Headache: Secondary | ICD-10-CM | POA: Diagnosis not present

## 2016-09-10 DIAGNOSIS — E032 Hypothyroidism due to medicaments and other exogenous substances: Secondary | ICD-10-CM | POA: Diagnosis not present

## 2016-09-10 DIAGNOSIS — M25511 Pain in right shoulder: Secondary | ICD-10-CM | POA: Diagnosis not present

## 2016-09-10 DIAGNOSIS — M199 Osteoarthritis, unspecified site: Secondary | ICD-10-CM | POA: Diagnosis not present

## 2016-09-10 DIAGNOSIS — G8928 Other chronic postprocedural pain: Secondary | ICD-10-CM | POA: Diagnosis not present

## 2016-09-10 DIAGNOSIS — G8929 Other chronic pain: Secondary | ICD-10-CM | POA: Diagnosis not present

## 2016-09-11 DIAGNOSIS — E039 Hypothyroidism, unspecified: Secondary | ICD-10-CM | POA: Diagnosis not present

## 2016-09-11 DIAGNOSIS — K219 Gastro-esophageal reflux disease without esophagitis: Secondary | ICD-10-CM | POA: Diagnosis not present

## 2016-09-11 DIAGNOSIS — F329 Major depressive disorder, single episode, unspecified: Secondary | ICD-10-CM | POA: Diagnosis not present

## 2016-09-11 DIAGNOSIS — D509 Iron deficiency anemia, unspecified: Secondary | ICD-10-CM | POA: Diagnosis not present

## 2016-09-12 ENCOUNTER — Other Ambulatory Visit: Payer: Self-pay | Admitting: Family Medicine

## 2016-09-14 ENCOUNTER — Other Ambulatory Visit: Payer: Self-pay

## 2016-09-14 NOTE — Telephone Encounter (Signed)
Last ov 02/2016 Seeing mds at [place] since then. I called pharmacy to let them know he needs to establish with a pcp for refills.  [facility] 90 day supply of fluoxetine

## 2016-09-18 ENCOUNTER — Other Ambulatory Visit: Payer: Self-pay | Admitting: Family Medicine

## 2016-09-18 DIAGNOSIS — E039 Hypothyroidism, unspecified: Secondary | ICD-10-CM

## 2016-09-19 DIAGNOSIS — E032 Hypothyroidism due to medicaments and other exogenous substances: Secondary | ICD-10-CM | POA: Diagnosis not present

## 2016-09-19 DIAGNOSIS — K219 Gastro-esophageal reflux disease without esophagitis: Secondary | ICD-10-CM | POA: Diagnosis not present

## 2016-09-19 DIAGNOSIS — D509 Iron deficiency anemia, unspecified: Secondary | ICD-10-CM | POA: Diagnosis not present

## 2016-09-19 DIAGNOSIS — F329 Major depressive disorder, single episode, unspecified: Secondary | ICD-10-CM | POA: Diagnosis not present

## 2016-09-26 ENCOUNTER — Other Ambulatory Visit (INDEPENDENT_AMBULATORY_CARE_PROVIDER_SITE_OTHER): Payer: PPO

## 2016-09-26 ENCOUNTER — Telehealth (INDEPENDENT_AMBULATORY_CARE_PROVIDER_SITE_OTHER): Payer: Self-pay | Admitting: Orthopedic Surgery

## 2016-09-26 DIAGNOSIS — E039 Hypothyroidism, unspecified: Secondary | ICD-10-CM

## 2016-09-26 LAB — TSH: TSH: 0.04 u[IU]/mL — AB (ref 0.35–4.50)

## 2016-09-26 LAB — T4, FREE: Free T4: 1 ng/dL (ref 0.60–1.60)

## 2016-09-26 NOTE — Telephone Encounter (Signed)
FYI, patient came into office today to speak with Taren and I. He stated that he wants to cancel everything when it comes to surgery due to his poor health. He is concerned that it will make him worse than he already is feeling now. States his body aches everyday. Does not want to reschedule surgery.

## 2016-09-27 LAB — T3: T3, Total: 141 ng/dL (ref 76–181)

## 2016-10-05 ENCOUNTER — Ambulatory Visit (INDEPENDENT_AMBULATORY_CARE_PROVIDER_SITE_OTHER): Payer: PPO | Admitting: Family Medicine

## 2016-10-05 ENCOUNTER — Encounter: Payer: Self-pay | Admitting: Family Medicine

## 2016-10-05 VITALS — BP 118/78 | HR 87 | Ht 69.5 in | Wt 229.0 lb

## 2016-10-05 DIAGNOSIS — Z7189 Other specified counseling: Secondary | ICD-10-CM | POA: Diagnosis not present

## 2016-10-05 DIAGNOSIS — E039 Hypothyroidism, unspecified: Secondary | ICD-10-CM | POA: Diagnosis not present

## 2016-10-05 DIAGNOSIS — K1379 Other lesions of oral mucosa: Secondary | ICD-10-CM

## 2016-10-05 DIAGNOSIS — G43009 Migraine without aura, not intractable, without status migrainosus: Secondary | ICD-10-CM

## 2016-10-05 DIAGNOSIS — G894 Chronic pain syndrome: Secondary | ICD-10-CM

## 2016-10-05 DIAGNOSIS — Z0000 Encounter for general adult medical examination without abnormal findings: Secondary | ICD-10-CM | POA: Insufficient documentation

## 2016-10-05 DIAGNOSIS — Z23 Encounter for immunization: Secondary | ICD-10-CM

## 2016-10-05 DIAGNOSIS — D509 Iron deficiency anemia, unspecified: Secondary | ICD-10-CM

## 2016-10-05 DIAGNOSIS — J3489 Other specified disorders of nose and nasal sinuses: Secondary | ICD-10-CM

## 2016-10-05 DIAGNOSIS — K2100 Gastro-esophageal reflux disease with esophagitis, without bleeding: Secondary | ICD-10-CM

## 2016-10-05 MED ORDER — SUMATRIPTAN SUCCINATE 6 MG/0.5ML ~~LOC~~ SOLN
6.0000 mg | Freq: Once | SUBCUTANEOUS | Status: AC
  Administered 2016-10-05: 6 mg via SUBCUTANEOUS

## 2016-10-05 MED ORDER — SUMATRIPTAN SUCCINATE 50 MG PO TABS: 50.0000 mg | ORAL_TABLET | Freq: Once | ORAL | 1 refills | Status: DC

## 2016-10-05 MED ORDER — MUPIROCIN 2 % EX OINT: 1.0000 "application " | TOPICAL_OINTMENT | Freq: Two times a day (BID) | CUTANEOUS | 0 refills | Status: DC

## 2016-10-05 MED ORDER — AMITRIPTYLINE HCL 25 MG PO TABS: 25.0000 mg | ORAL_TABLET | Freq: Every day | ORAL | 3 refills | Status: DC

## 2016-10-05 MED ORDER — LEVOTHYROXINE SODIUM 25 MCG PO TABS: 25.0000 ug | ORAL_TABLET | Freq: Every day | ORAL | 6 refills | Status: DC

## 2016-10-05 NOTE — Assessment & Plan Note (Signed)
Pt desires to come off pain medication at this time.  Taking hydrocodone by pain management.

## 2016-10-05 NOTE — Assessment & Plan Note (Addendum)
Acute migraine today, similar to prior episodes, nonfocal neurological exam today, treated successfully with imitrex injection today. Discussed imitrex PRN - sent to pharmacy. If recurrent will increase topamax dosing.

## 2016-10-05 NOTE — Patient Instructions (Addendum)
Imitrex shot today.  prevnar today.  Try topical antibiotic ointment for nose - let me know if persistent.  Advanced directive packet provided today Decrease levothyroxine to 40mcg daily.  Sign release for records from bone density scan at [facility].  Return in 2 months for follow up visit - we will recheck labs at that time.  Health Maintenance, Male A healthy lifestyle and preventative care can promote health and wellness.  Maintain regular health, dental, and eye exams.  Eat a healthy diet. Foods like vegetables, fruits, whole grains, low-fat dairy products, and lean protein foods contain the nutrients you need and are low in calories. Decrease your intake of foods high in solid fats, added sugars, and salt. Get information about a proper diet from your health care provider, if necessary.  Regular physical exercise is one of the most important things you can do for your health. Most adults should get at least 150 minutes of moderate-intensity exercise (any activity that increases your heart rate and causes you to sweat) each week. In addition, most adults need muscle-strengthening exercises on 2 or more days a week.   Maintain a healthy weight. The body mass index (BMI) is a screening tool to identify possible weight problems. It provides an estimate of body fat based on height and weight. Your health care provider can find your BMI and can help you achieve or maintain a healthy weight. For males 20 years and older:  A BMI below 18.5 is considered underweight.  A BMI of 18.5 to 24.9 is normal.  A BMI of 25 to 29.9 is considered overweight.  A BMI of 30 and above is considered obese.  Maintain normal blood lipids and cholesterol by exercising and minimizing your intake of saturated fat. Eat a balanced diet with plenty of fruits and vegetables. Blood tests for lipids and cholesterol should begin at age 31 and be repeated every 5 years. If your lipid or cholesterol levels are high,  you are over age 53, or you are at high risk for heart disease, you may need your cholesterol levels checked more frequently.Ongoing high lipid and cholesterol levels should be treated with medicines if diet and exercise are not working.  If you smoke, find out from your health care provider how to quit. If you do not use tobacco, do not start.  Lung cancer screening is recommended for adults aged 55-80 years who are at high risk for developing lung cancer because of a history of smoking. A yearly low-dose CT scan of the lungs is recommended for people who have at least a 30-pack-year history of smoking and are current smokers or have quit within the past 15 years. A pack year of smoking is smoking an average of 1 pack of cigarettes a day for 1 year (for example, a 30-pack-year history of smoking could mean smoking 1 pack a day for 30 years or 2 packs a day for 15 years). Yearly screening should continue until the smoker has stopped smoking for at least 15 years. Yearly screening should be stopped for people who develop a health problem that would prevent them from having lung cancer treatment.  If you choose to drink alcohol, do not have more than 2 drinks per day. One drink is considered to be 12 oz (360 mL) of beer, 5 oz (150 mL) of wine, or 1.5 oz (45 mL) of liquor.  Avoid the use of street drugs. Do not share needles with anyone. Ask for help if you need support  or instructions about stopping the use of drugs.  High blood pressure causes heart disease and increases the risk of stroke. High blood pressure is more likely to develop in:  People who have blood pressure in the end of the normal range (100-139/85-89 mm Hg).  People who are overweight or obese.  People who are African American.  If you are 48-45 years of age, have your blood pressure checked every 3-5 years. If you are 53 years of age or older, have your blood pressure checked every year. You should have your blood pressure measured  twice-once when you are at a hospital or clinic, and once when you are not at a hospital or clinic. Record the average of the two measurements. To check your blood pressure when you are not at a hospital or clinic, you can use:  An automated blood pressure machine at a pharmacy.  A home blood pressure monitor.  If you are 78-65 years old, ask your health care provider if you should take aspirin to prevent heart disease.  Diabetes screening involves taking a blood sample to check your fasting blood sugar level. This should be done once every 3 years after age 40 if you are at a normal weight and without risk factors for diabetes. Testing should be considered at a younger age or be carried out more frequently if you are overweight and have at least 1 risk factor for diabetes.  Colorectal cancer can be detected and often prevented. Most routine colorectal cancer screening begins at the age of 59 and continues through age 62. However, your health care provider may recommend screening at an earlier age if you have risk factors for colon cancer. On a yearly basis, your health care provider may provide home test kits to check for hidden blood in the stool. A small camera at the end of a tube may be used to directly examine the colon (sigmoidoscopy or colonoscopy) to detect the earliest forms of colorectal cancer. Talk to your health care provider about this at age 73 when routine screening begins. A direct exam of the colon should be repeated every 5-10 years through age 65, unless early forms of precancerous polyps or small growths are found.  People who are at an increased risk for hepatitis B should be screened for this virus. You are considered at high risk for hepatitis B if:  You were born in a country where hepatitis B occurs often. Talk with your health care provider about which countries are considered high risk.  Your parents were born in a high-risk country and you have not received a shot to  protect against hepatitis B (hepatitis B vaccine).  You have HIV or AIDS.  You use needles to inject street drugs.  You live with, or have sex with, someone who has hepatitis B.  You are a man who has sex with other men (MSM).  You get hemodialysis treatment.  You take certain medicines for conditions like cancer, organ transplantation, and autoimmune conditions.  Hepatitis C blood testing is recommended for all people born from [birth year] through 1965 and any individual with known risk factors for hepatitis C.  Healthy men should no longer receive prostate-specific antigen (PSA) blood tests as part of routine cancer screening. Talk to your health care provider about prostate cancer screening.  Testicular cancer screening is not recommended for adolescents or adult males who have no symptoms. Screening includes self-exam, a health care provider exam, and other screening tests. Consult with your health  care provider about any symptoms you have or any concerns you have about testicular cancer.  Practice safe sex. Use condoms and avoid high-risk sexual practices to reduce the spread of sexually transmitted infections (STIs).  You should be screened for STIs, including gonorrhea and chlamydia if:  You are sexually active and are younger than 24 years.  You are older than 24 years, and your health care provider tells you that you are at risk for this type of infection.  Your sexual activity has changed since you were last screened, and you are at an increased risk for chlamydia or gonorrhea. Ask your health care provider if you are at risk.  If you are at risk of being infected with HIV, it is recommended that you take a prescription medicine daily to prevent HIV infection. This is called pre-exposure prophylaxis (PrEP). You are considered at risk if:  You are a man who has sex with other men (MSM).  You are a heterosexual man who is sexually active with multiple partners.  You take drugs by  injection.  You are sexually active with a partner who has HIV.  Talk with your health care provider about whether you are at high risk of being infected with HIV. If you choose to begin PrEP, you should first be tested for HIV. You should then be tested every 3 months for as long as you are taking PrEP.  Use sunscreen. Apply sunscreen liberally and repeatedly throughout the day. You should seek shade when your shadow is shorter than you. Protect yourself by wearing long sleeves, pants, a wide-brimmed hat, and sunglasses year round whenever you are outdoors.  Tell your health care provider of new moles or changes in moles, especially if there is a change in shape or color. Also, tell your health care provider if a mole is larger than the size of a pencil eraser.  A one-time screening for abdominal aortic aneurysm (AAA) and surgical repair of large AAAs by ultrasound is recommended for men aged 8-75 years who are current or former smokers.  Stay current with your vaccines (immunizations). This information is not intended to replace advice given to you by your health care provider. Make sure you discuss any questions you have with your health care provider. Document Released: 03/29/2008 Document Revised: 10/22/2014 Document Reviewed: 07/05/2015 Elsevier Interactive Patient Education  2017 [employer].

## 2016-10-05 NOTE — Progress Notes (Signed)
BP 118/78   Pulse 87   Ht 5' 9.5" (1.765 m)   Wt 229 lb (103.9 kg)   SpO2 97%   BMI 33.33 kg/m    CC: medicare wellness visit Subjective:    Patient ID: Nicholas [name], male    DOB: 05-Dec-1950, 65 y.o.   MRN: [id number]  HPI: Nicholas [name] is a 65 y.o. male presenting on 10/05/2016 for Medicare Wellness (has had a HA since yesterday about 5pm, "nose sore has been using a hot towel")   Recent ER visit for abd pain with diarrhea - found to have elevated LFTs. C diff negative. Ultrasound was normal.   1d h/o severe pain behind eyes that feels like previous migraine. This is despite topamax 25mg  bid and gabapentin 300mg  QID. Photo/phonophobia. No nausea. Increasing migraines attributed to increased stress as approaching 1 yr anniversary of wife's death.   Tired dealing with severe chronic pain  Has decided he doesn't want any more orthopedic surgeries.  Has decided he wants to wean off all his pain medications.  He's also stopped prozac.  Tried morphine - didn't help. Currently on hydrocodone - not helping. Tramadol not helping.   New bed recently - sleeping better.  Sore in nose over last few days.   Hearing screen - passed Vision screen - passed Fall risk screen - passed Depression screen - PQH9 = 8  Preventative: COLONOSCOPY 11/2012 diverticulosis, rpt 10 yrs [name]) Prostate cancer screening - declines  Lung cancer screening - not eligible DEXA - pt states he had this done at [place] Flu shot yearly Td 2007 Pneumovax 2012, prevnar today.  Shingles shot - endorses he had this ~2014.  Advanced directive discussion - has not set up. Son is HCPOA. Packet provided today. Seat belt use discussed  Sunscreen use discussed. No changing moles on skin.  Non smoker  Alcohol - none   Widower 2016 - wife passed from ESRD related illness Occ: landscaping Edu: HS  Relevant past medical, surgical, family and social history reviewed and updated as indicated. Interim  medical history since our last visit reviewed. Allergies and medications reviewed and updated. Current Outpatient Prescriptions on File Prior to Visit  Medication Sig  . EPINEPHrine (EPIPEN 2-PAK) 0.3 mg/0.3 mL IJ SOAJ injection Inject 0.3 mg into the muscle once.  . ferrous sulfate 325 (65 FE) MG tablet Take 325 mg by mouth daily with breakfast.  . gabapentin (NEURONTIN) 300 MG capsule TAKE ONE CAPSULE BY MOUTH 4 TIMES A DAY  . hydrocortisone 2.5 % cream Apply 1 application topically at bedtime as needed (for rash).   . Multiple Vitamin (MULTIVITAMIN WITH MINERALS) TABS tablet Take 1 tablet by mouth daily. Centrum silver.  . pantoprazole (PROTONIX) 40 MG tablet Take 1 tablet (40 mg total) by mouth daily before breakfast.  . Probiotic Product (PROBIOTIC PO) Take 1 capsule by mouth daily.   [name] topiramate (TOPAMAX) 25 MG tablet TAKE 1 TABLET BY MOUTH TWICE A DAY   No current facility-administered medications on file prior to visit.     Review of Systems Per HPI unless specifically indicated in ROS section     Objective:    BP 118/78   Pulse 87   Ht 5' 9.5" (1.765 m)   Wt 229 lb (103.9 kg)   SpO2 97%   BMI 33.33 kg/m   Wt Readings from Last 3 Encounters:  10/05/16 229 lb (103.9 kg)  08/21/16 218 lb 2 oz (98.9 kg)  08/15/16 225 lb 4  oz (102.2 kg)    Physical Exam  Constitutional: He is oriented to person, place, and time. He appears well-developed and well-nourished. No distress.  HENT:  Head: Normocephalic and atraumatic.  Right Ear: Hearing, tympanic membrane, external ear and ear canal normal.  Left Ear: Hearing, tympanic membrane, external ear and ear canal normal.  Nose: Mucosal edema present.  Mouth/Throat: Uvula is midline, oropharynx is clear and moist and mucous membranes are normal. No oropharyngeal exudate, posterior oropharyngeal edema or posterior oropharyngeal erythema.  Dry sore anterior lateral left inner nare Nodule at L tonsillar pillar  Eyes: Conjunctivae and  EOM are normal. Pupils are equal, round, and reactive to light. No scleral icterus.  Neck: Normal range of motion. Neck supple. Carotid bruit is not present. No thyromegaly present.  Cardiovascular: Normal rate, regular rhythm, normal heart sounds and intact distal pulses.   No murmur heard. Pulses:      Radial pulses are 2+ on the right side, and 2+ on the left side.  Pulmonary/Chest: Effort normal and breath sounds normal. No respiratory distress. He has no wheezes. He has no rales.  Abdominal: Soft. Bowel sounds are normal. He exhibits no distension and no mass. There is no tenderness. There is no rebound and no guarding.  Musculoskeletal: Normal range of motion. He exhibits no edema.  Lymphadenopathy:    He has no cervical adenopathy.  Neurological: He is alert and oriented to person, place, and time.  CN grossly intact, station and gait intact Recall 3/3 Calculation 5/5 serial 3s  Skin: Skin is warm and dry. No rash noted.  Psychiatric: He has a normal mood and affect. His behavior is normal. Judgment and thought content normal.  Nursing note and vitals reviewed.  Results for orders placed or performed in visit on 09/26/16  TSH  Result Value Ref Range   TSH 0.04 (L) 0.35 - 4.50 uIU/mL  T3  Result Value Ref Range   T3, Total 141.0 76 - 181 ng/dL  T4, free  Result Value Ref Range   Free T4 1.00 0.60 - 1.60 ng/dL   Lab Results  Component Value Date   PSA 1.61 08/16/2016   PSA 1.11 10/15/2007       Assessment & Plan:   Problem List Items Addressed This Visit    Advanced care planning/counseling discussion    Advanced directive discussion - has not set up. Son is HCPOA. Packet provided today.      Chronic pain syndrome    Pt desires to come off pain medication at this time.  Taking hydrocodone by pain management.       GERD (gastroesophageal reflux disease)    Doing well on protonix daily.       Hypothyroidism    TSH low today - will decrease levothyroxine to  76mcg daily. Reassess at f/u visit in 2 months.       Relevant Medications   levothyroxine (SYNTHROID, LEVOTHROID) 25 MCG tablet   IDA (iron deficiency anemia)    Continues ferrous sulfate daily, with concommitant darkening of stool.      Lesion of palate    L tonsillar pillar nodule noted today - reassess next visit and if persistent, refer to ENT.       Medicare annual wellness visit, initial - Primary    I have personally reviewed the Medicare Annual Wellness questionnaire and have noted 1. The patient's medical and social history 2. Their use of alcohol, tobacco or illicit drugs 3. Their current medications and supplements 4.  The patient's functional ability including ADL's, fall risks, home safety risks and hearing or visual impairment. Cognitive function has been assessed and addressed as indicated.  5. Diet and physical activity 6. Evidence for depression or mood disorders The patients weight, height, BMI have been recorded in the chart. I have made referrals, counseling and provided education to the patient based on review of the above and I have provided the pt with a written personalized care plan for preventive services. Provider list updated.. See scanned questionairre as needed for further documentation. Reviewed preventative protocols and updated unless pt declined.       Migraine    Acute migraine today, similar to prior episodes, nonfocal neurological exam today, treated successfully with imitrex injection today. Discussed imitrex PRN - sent to pharmacy. If recurrent will increase topamax dosing.       Relevant Medications   HYDROcodone-acetaminophen (NORCO) 10-325 MG tablet   SUMAtriptan (IMITREX) injection 6 mg (Completed)   amitriptyline (ELAVIL) 25 MG tablet   SUMAtriptan (IMITREX) 50 MG tablet   Nasal sore    In h/o MRSA, sent mupirocin ointment to pharmacy.        Other Visit Diagnoses    Need for prophylactic vaccination against Streptococcus  pneumoniae (pneumococcus)       Relevant Orders   Pneumococcal conjugate vaccine 13-valent (Completed)       Follow up plan: Return in about 2 months (around 12/06/2016) for follow up visit.  [name], MD

## 2016-10-05 NOTE — Assessment & Plan Note (Signed)
Advanced directive discussion - has not set up. Son is HCPOA. Packet provided today.

## 2016-10-05 NOTE — Assessment & Plan Note (Signed)

## 2016-10-05 NOTE — Assessment & Plan Note (Signed)
In h/o MRSA, sent mupirocin ointment to pharmacy.

## 2016-10-05 NOTE — Assessment & Plan Note (Signed)
Doing well on protonix daily.

## 2016-10-05 NOTE — Assessment & Plan Note (Signed)
TSH low today - will decrease levothyroxine to 11mcg daily. Reassess at f/u visit in 2 months.

## 2016-10-05 NOTE — Assessment & Plan Note (Addendum)
Continues ferrous sulfate daily, with concommitant darkening of stool.

## 2016-10-05 NOTE — Assessment & Plan Note (Signed)
L tonsillar pillar nodule noted today - reassess next visit and if persistent, refer to ENT.

## 2016-10-16 DIAGNOSIS — G894 Chronic pain syndrome: Secondary | ICD-10-CM | POA: Diagnosis not present

## 2016-10-16 DIAGNOSIS — M25512 Pain in left shoulder: Secondary | ICD-10-CM | POA: Diagnosis not present

## 2016-10-16 DIAGNOSIS — G8929 Other chronic pain: Secondary | ICD-10-CM | POA: Diagnosis not present

## 2016-10-16 DIAGNOSIS — M25511 Pain in right shoulder: Secondary | ICD-10-CM | POA: Diagnosis not present

## 2016-10-16 DIAGNOSIS — R51 Headache: Secondary | ICD-10-CM | POA: Diagnosis not present

## 2016-10-16 DIAGNOSIS — F1120 Opioid dependence, uncomplicated: Secondary | ICD-10-CM | POA: Diagnosis not present

## 2016-10-16 DIAGNOSIS — M25569 Pain in unspecified knee: Secondary | ICD-10-CM | POA: Diagnosis not present

## 2016-10-16 DIAGNOSIS — G8928 Other chronic postprocedural pain: Secondary | ICD-10-CM | POA: Diagnosis not present

## 2016-10-16 DIAGNOSIS — M2556 Pain in knee: Secondary | ICD-10-CM | POA: Diagnosis not present

## 2016-11-01 ENCOUNTER — Encounter (HOSPITAL_COMMUNITY): Payer: Self-pay | Admitting: Radiology

## 2016-11-01 ENCOUNTER — Emergency Department (HOSPITAL_COMMUNITY): Payer: PPO

## 2016-11-01 ENCOUNTER — Emergency Department (HOSPITAL_COMMUNITY)
Admission: EM | Admit: 2016-11-01 | Discharge: 2016-11-02 | Disposition: A | Discharge status: Home / Self Care | Payer: PPO | Attending: Emergency Medicine | Admitting: Emergency Medicine

## 2016-11-01 DIAGNOSIS — Z96651 Presence of right artificial knee joint: Secondary | ICD-10-CM | POA: Insufficient documentation

## 2016-11-01 DIAGNOSIS — Z79899 Other long term (current) drug therapy: Secondary | ICD-10-CM | POA: Diagnosis not present

## 2016-11-01 DIAGNOSIS — I509 Heart failure, unspecified: Secondary | ICD-10-CM | POA: Insufficient documentation

## 2016-11-01 DIAGNOSIS — R509 Fever, unspecified: Secondary | ICD-10-CM | POA: Diagnosis not present

## 2016-11-01 DIAGNOSIS — E039 Hypothyroidism, unspecified: Secondary | ICD-10-CM | POA: Diagnosis not present

## 2016-11-01 DIAGNOSIS — I110 Hypertensive heart disease with heart failure: Secondary | ICD-10-CM | POA: Diagnosis not present

## 2016-11-01 DIAGNOSIS — R05 Cough: Secondary | ICD-10-CM | POA: Diagnosis not present

## 2016-11-01 DIAGNOSIS — Z8673 Personal history of transient ischemic attack (TIA), and cerebral infarction without residual deficits: Secondary | ICD-10-CM | POA: Diagnosis not present

## 2016-11-01 DIAGNOSIS — R059 Cough, unspecified: Secondary | ICD-10-CM

## 2016-11-01 DIAGNOSIS — Z91010 Allergy to peanuts: Secondary | ICD-10-CM | POA: Diagnosis not present

## 2016-11-01 DIAGNOSIS — Z91040 Latex allergy status: Secondary | ICD-10-CM | POA: Insufficient documentation

## 2016-11-01 DIAGNOSIS — J40 Bronchitis, not specified as acute or chronic: Secondary | ICD-10-CM | POA: Diagnosis not present

## 2016-11-01 DIAGNOSIS — R079 Chest pain, unspecified: Secondary | ICD-10-CM | POA: Diagnosis not present

## 2016-11-01 LAB — CBC WITH DIFFERENTIAL/PLATELET
BASOS PCT: 1 %
Basophils Absolute: 0.1 10*3/uL (ref 0.0–0.1)
EOS ABS: 0.4 10*3/uL (ref 0.0–0.7)
EOS PCT: 4 %
HCT: 42.1 % (ref 39.0–52.0)
HEMOGLOBIN: 13.9 g/dL (ref 13.0–17.0)
LYMPHS ABS: 1.3 10*3/uL (ref 0.7–4.0)
Lymphocytes Relative: 15 %
MCH: 29.5 pg (ref 26.0–34.0)
MCHC: 33 g/dL (ref 30.0–36.0)
MCV: 89.4 fL (ref 78.0–100.0)
MONO ABS: 1.6 10*3/uL — AB (ref 0.1–1.0)
Monocytes Relative: 18 %
NEUTROS PCT: 62 %
Neutro Abs: 5.7 10*3/uL (ref 1.7–7.7)
PLATELETS: 191 10*3/uL (ref 150–400)
RBC: 4.71 MIL/uL (ref 4.22–5.81)
RDW: 15.8 % — AB (ref 11.5–15.5)
WBC: 9.1 10*3/uL (ref 4.0–10.5)

## 2016-11-01 LAB — I-STAT TROPONIN, ED: TROPONIN I, POC: 0 ng/mL (ref 0.00–0.08)

## 2016-11-01 LAB — COMPREHENSIVE METABOLIC PANEL
ALBUMIN: 3.9 g/dL (ref 3.5–5.0)
ALK PHOS: 75 U/L (ref 38–126)
ALT: 17 U/L (ref 17–63)
ANION GAP: 13 (ref 5–15)
AST: 22 U/L (ref 15–41)
BUN: 16 mg/dL (ref 6–20)
CALCIUM: 9 mg/dL (ref 8.9–10.3)
CHLORIDE: 105 mmol/L (ref 101–111)
CO2: 21 mmol/L — ABNORMAL LOW (ref 22–32)
Creatinine, Ser: 1.22 mg/dL (ref 0.61–1.24)
GFR calc non Af Amer: >60 mL/min (ref >60)
Glucose, Bld: 94 mg/dL (ref 65–99)
POTASSIUM: 4.4 mmol/L (ref 3.5–5.1)
SODIUM: 139 mmol/L (ref 135–145)
Total Bilirubin: 1.7 mg/dL — ABNORMAL HIGH (ref 0.3–1.2)
Total Protein: 6.9 g/dL (ref 6.5–8.1)

## 2016-11-01 LAB — D-DIMER, QUANTITATIVE (NOT AT ARMC): D DIMER QUANT: 1 ug{FEU}/mL — AB (ref 0.00–0.50)

## 2016-11-01 LAB — I-STAT CG4 LACTIC ACID, ED: Lactic Acid, Venous: 1.66 mmol/L (ref 0.5–1.9)

## 2016-11-01 LAB — INFLUENZA PANEL BY PCR (TYPE A & B)
INFLAPCR: NEGATIVE
Influenza B By PCR: NEGATIVE

## 2016-11-01 MED ORDER — IOPAMIDOL (ISOVUE-370) INJECTION 76%
INTRAVENOUS | Status: AC
  Administered 2016-11-01: 100 mL
  Filled 2016-11-01: qty 100

## 2016-11-01 MED ORDER — LIDOCAINE 5 % EX PTCH
1.0000 | MEDICATED_PATCH | Freq: Once | CUTANEOUS | Status: DC
  Administered 2016-11-01: 1 via TRANSDERMAL
  Filled 2016-11-01: qty 1

## 2016-11-01 MED ORDER — ACETAMINOPHEN 500 MG PO TABS
1000.0000 mg | ORAL_TABLET | Freq: Once | ORAL | Status: AC
  Administered 2016-11-01: 1000 mg via ORAL
  Filled 2016-11-01: qty 2

## 2016-11-01 MED ORDER — BENZONATATE 100 MG PO CAPS
100.0000 mg | ORAL_CAPSULE | Freq: Once | ORAL | Status: AC
  Administered 2016-11-01: 100 mg via ORAL
  Filled 2016-11-01: qty 1

## 2016-11-01 MED ORDER — ALBUTEROL SULFATE HFA 108 (90 BASE) MCG/ACT IN AERS
2.0000 | INHALATION_SPRAY | Freq: Once | RESPIRATORY_TRACT | Status: AC
  Administered 2016-11-01: 2 via RESPIRATORY_TRACT
  Filled 2016-11-01: qty 6.7

## 2016-11-01 NOTE — ED Provider Notes (Signed)
[facility] DEPT Provider Note  CSN: [id number] Arrival date & time: 11/01/16  1424  History   Chief Complaint Chief Complaint  Patient presents with  . URI   HPI Nicholas [name] is a 66 y.o. male.  The history is provided by the patient and medical records. No language interpreter was used.  Illness  This is a new problem. The current episode started more than 1 week ago. The problem occurs constantly. The problem has been gradually worsening. Associated symptoms include chest pain (left lateral) and shortness of breath. Exacerbated by: Movement, palpation, cough. Nothing relieves the symptoms.    Past Medical History:  Diagnosis Date  . Acute kidney failure 08/2008  . Allergy   . Asthma    ?of this  . Blood transfusion   . CHF (congestive heart failure) ([place])    ?of this, pt denies  . CLOSTRIDIUM DIFFICILE COLITIS 07/04/2010   Annotation: 12/09, 2/10 Qualifier: Diagnosis of  By: [name], [name]    . Depression with anxiety   . Duodenitis determined by biopsy 02/2016   peptic likely due to aleve (erosive gastropathy with duodenal erosions)  . ECZEMA 07/04/2010   Qualifier: Diagnosis of  By: [name], [name]    . GERD (gastroesophageal reflux disease)   . Heart attack    08/2008 (likely demand ischemia in the setting of MSSA sepsis/R TKA infection)  . History of stomach ulcers   . Hypothyroidism   . Lower GI bleeding   . MALAR AND MAXILLARY BONES CLOSED FRACTURE 07/04/2010   Annotation: ORIF Qualifier: Diagnosis of  By: [name], [name]    . Migraine    "definitely"  . MRSA (methicillin resistant Staphylococcus aureus)    in leg, had to place steel rod in leg  . Personal history of colonic adenoma 06/01/2003  . PFO (patent foramen ovale)    small PFO by 08/2008 TEE  . Pneumonia 08/2008   "while in ICU"  . Seizures ([place])    "long time ago"  . Stroke [facility])    unable to complete sentences at times   Patient Active Problem List   Diagnosis Date Noted  . Medicare  annual wellness visit, initial 10/05/2016  . Advanced care planning/counseling discussion 10/05/2016  . Nasal sore 10/05/2016  . Diarrhea 08/21/2016  . Obesity, Class I, BMI 30-34.9 07/19/2016  . Hypothyroidism   . IDA (iron deficiency anemia) 04/23/2016  . Chronic pain syndrome 04/23/2016  . Inflammatory arthritis 04/23/2016  . Chronic leg pain 04/23/2016  . Lesion of palate 04/23/2016  . Grieving 04/23/2016  . GERD (gastroesophageal reflux disease)   . Duodenitis determined by biopsy 02/13/2016  . Diverticulosis of colon without hemorrhage 01/31/2016  . Migraine 01/11/2012  . MRSA (methicillin resistant Staphylococcus aureus) infection 07/04/2010  . DEGENERATIVE DISC DISEASE 07/04/2010  . History of total right knee replacement 07/04/2010  . Osteoarthritis 06/09/2007  . Personal history of colonic adenoma 06/01/2003   Past Surgical History:  Procedure Laterality Date  . anterior  nerve transposition  07/2009   left ulnar nerve  . antibiotic spacer exchange  11/2008; 08/2006   right knee  . ARTHROTOMY  08/2008   right knee w/I&D  . CATARACT EXTRACTION    . COLONOSCOPY  11/2012   diverticulosis, rpt 10 yrs [name])  . ESOPHAGOGASTRODUODENOSCOPY  02/2016   erosive gastropathy with duodenal erosions [name])  . HARDWARE REMOVAL  04/2006   right knee w/antibiotic spacers placed  . INGUINAL HERNIA REPAIR  early 1990's   bilateral  .  JOINT REPLACEMENT    . KNEE ARTHROSCOPY  10/2001   right  . KNEE FUSION  03/2009   right knee removal; antibiotic spacers;   . REPLACEMENT TOTAL KNEE  08/2008; 09/2001   right  . SYNOVECTOMY  06/2005   debridement, liner exchange right knee  . TOE AMPUTATION Left 08/2008   great toe  . TOE AMPUTATION Left 04/2016   2nd toe [name]  . TONSILLECTOMY      Home Medications    Prior to Admission medications   Medication Sig Start Date End Date Taking? Authorizing Provider  amitriptyline (ELAVIL) 25 MG tablet Take 1 tablet (25 mg total) by mouth  at bedtime. 10/05/16   [name], MD  azithromycin (ZITHROMAX Z-PAK) 250 MG tablet Please take 2 tablets tonight then 1 tablet per day for 4 days 11/02/16   Mayer Camel, MD  benzonatate (TESSALON PERLES) 100 MG capsule Take 1 capsule (100 mg total) by mouth 3 (three) times daily as needed for cough. 11/02/16   Mayer Camel, MD  EPINEPHrine (EPIPEN 2-PAK) 0.3 mg/0.3 mL IJ SOAJ injection Inject 0.3 mg into the muscle once.    Historical Provider, MD  ferrous sulfate 325 (65 FE) MG tablet Take 325 mg by mouth daily with breakfast.    Historical Provider, MD  gabapentin (NEURONTIN) 300 MG capsule TAKE ONE CAPSULE BY MOUTH 4 TIMES A DAY 08/20/16   [name], MD  HYDROcodone-acetaminophen [facility] 10-325 MG tablet Take 1 tablet by mouth as needed. Takes 1 in am and 1/2 at night 10/05/16   [name], MD  hydrocortisone 2.5 % cream Apply 1 application topically at bedtime as needed (for rash).  12/13/15   Historical Provider, MD  levothyroxine (SYNTHROID, LEVOTHROID) 25 MCG tablet Take 1 tablet (25 mcg total) by mouth daily before breakfast. 10/05/16   [name], MD  Multiple Vitamin (MULTIVITAMIN WITH MINERALS) TABS tablet Take 1 tablet by mouth daily. Centrum silver.    Historical Provider, MD  mupirocin ointment (BACTROBAN) 2 % Place 1 application into the nose 2 (two) times daily. 10/05/16   [name], MD  oxymetazoline (AFRIN NASAL SPRAY) 0.05 % nasal spray Place 1 spray into both nostrils 2 (two) times daily. Do NOT use more than 3 days 11/02/16   Mayer Camel, MD  pantoprazole (PROTONIX) 40 MG tablet Take 1 tablet (40 mg total) by mouth daily before breakfast. 05/24/16   Gatha Mayer, MD  Probiotic Product (PROBIOTIC PO) Take 1 capsule by mouth daily.     Historical Provider, MD  SUMAtriptan (IMITREX) 50 MG tablet Take 1 tablet (50 mg total) by mouth once. May repeat in 2 hours if headache persists or recurs. 10/05/16 10/05/16  [name], MD  topiramate  (TOPAMAX) 25 MG tablet TAKE 1 TABLET BY MOUTH TWICE A DAY 08/20/16   [name], MD   Family History Family History  Problem Relation Age of Onset  . Coronary artery disease Father   . Heart disease Mother   . Breast cancer Sister   . Diabetes Brother   . Diabetes Sister   . Colon cancer Neg Hx   . Stomach cancer Neg Hx    Social History Social History  Substance Use Topics  . Smoking status: Never Smoker  . Smokeless tobacco: Never Used  . Alcohol use No     Comment: "I abused alcohol; last drink  ~ 2005"    Allergies   Bee venom; Peanut-containing drug products; Celebrex [celecoxib]; Percocet [oxycodone-acetaminophen]; Latex; and Nsaids  Review of Systems Review of Systems  Constitutional: Positive for chills and fever.  Respiratory: Positive for cough and shortness of breath.   Cardiovascular: Positive for chest pain (left lateral).  All other systems reviewed and are negative.   Physical Exam Updated Vital Signs BP 132/71   Pulse 68   Temp 98.1 F (36.7 C) (Oral)   Resp 16   SpO2 96%   Physical Exam  Constitutional: He is oriented to person, place, and time. No distress.  Elderly Caucasian male  HENT:  Head: Normocephalic and atraumatic.  Eyes: EOM are normal. Pupils are equal, round, and reactive to light.  Neck: Normal range of motion. Neck supple.  Cardiovascular: Normal rate, regular rhythm and normal heart sounds.   Pulmonary/Chest: Effort normal and breath sounds normal. He exhibits tenderness (left lateral chest).  No tachypnea, no increased work of breathing, maintaining sats on room air  Abdominal: Soft. Bowel sounds are normal. He exhibits no distension. There is no tenderness.  Musculoskeletal: Normal range of motion.  Neurological: He is alert and oriented to person, place, and time.  Skin: Skin is warm and dry. Capillary refill takes less than 2 seconds. He is not diaphoretic.  Nursing note and vitals reviewed.   ED Treatments / Results   Labs (all labs ordered are listed, but only abnormal results are displayed) Labs Reviewed  CBC WITH DIFFERENTIAL/PLATELET - Abnormal; Notable for the following:       Result Value   RDW 15.8 (*)    Monocytes Absolute 1.6 (*)    All other components within normal limits  COMPREHENSIVE METABOLIC PANEL - Abnormal; Notable for the following:    CO2 21 (*)    Total Bilirubin 1.7 (*)    All other components within normal limits  D-DIMER, QUANTITATIVE (NOT AT [facility]) - Abnormal; Notable for the following:    D-Dimer, Quant 1.00 (*)    All other components within normal limits  INFLUENZA PANEL BY PCR (TYPE A & B)  I-STAT CG4 LACTIC ACID, ED  I-STAT TROPOININ, ED  I-STAT CG4 LACTIC ACID, ED   EKG  EKG Interpretation  Date/Time:  Thursday November 01 2016 14:36:05 EST Ventricular Rate:  79 PR Interval:  146 QRS Duration: 80 QT Interval:  382 QTC Calculation: 438 R Axis:   6 Text Interpretation:  Normal sinus rhythm Normal ECG No significant change since last tracing Confirmed by [facility] MD, ERIN ([id number]) on 11/01/2016 6:53:19 PM      Radiology Dg Chest 2 View  Result Date: 11/01/2016 CLINICAL DATA:  Productive cough congestion and wheezing EXAM: CHEST  2 VIEW COMPARISON:  08/15/2016 FINDINGS: Coarse interstitial opacities within the bilateral lung bases could reflect mild inflammation on chronic change. No consolidation. No pleural effusion. Stable borderline enlarged heart size. No pneumothorax. IMPRESSION: Coarse interstitial opacities in the bilateral lung bases could reflect mild interstitial inflammation on chronic change. There is no focal infiltrate identified. Electronically Signed   By: Donavan Foil M.D.   On: 11/01/2016 19:04   Ct Angio Chest Pe W And/or Wo Contrast  Result Date: 11/02/2016 CLINICAL DATA:  Productive cough for 5 days. Congestion and headache. Body aches. Small volume hemoptysis. Left pleuritic chest pain. EXAM: CT ANGIOGRAPHY CHEST WITH CONTRAST TECHNIQUE:  Multidetector CT imaging of the chest was performed using the standard protocol during bolus administration of intravenous contrast. Multiplanar CT image reconstructions and MIPs were obtained to evaluate the vascular anatomy. CONTRAST:  100 cc Isovue 370 IV COMPARISON:  Chest radiographs earlier this  day FINDINGS: Cardiovascular: There are no filling defects within the pulmonary arteries to the segmental level to suggest pulmonary embolus. Subsegmental branches are suboptimally assessed due to contrast bolus timing. No atherosclerosis of normal caliber thoracic aorta. No dissection. The left vertebral artery arises directly from the thoracic aorta, a normal variant. Heart size is upper normal. No pericardial effusion. Mediastinum/Nodes: No mediastinal or hilar adenopathy. No axillary adenopathy. Visualized thyroid gland is normal. The esophagus is decompressed. Lungs/Pleura: No dependent lower lobe opacities suggesting atelectasis. No evidence of pneumonia. No pulmonary edema. Mild motion artifact limits evaluation. No pleural fluid. Upper Abdomen: No acute abnormality. Right renal cyst is again seen, unchanged from CT abdomen 08/21/2016. Musculoskeletal: Multilevel degenerative change throughout spine. Advanced degenerative change in the right greater than left shoulder. Review of the MIP images confirms the above findings. IMPRESSION: 1. No pulmonary embolus.  No evidence of pneumonia. 2. Mild thoracic aortic atherosclerosis. 3. Dependent densities consistent with hypoventilatory atelectasis. Electronically Signed   By: Jeb Levering M.D.   On: 11/02/2016 00:04   Procedures Procedures (including critical care time)  Medications Ordered in ED Medications  lidocaine (LIDODERM) 5 % 1 patch (1 patch Transdermal Patch Applied 11/01/16 2010)  acetaminophen (TYLENOL) tablet 1,000 mg (1,000 mg Oral Given 11/01/16 1931)  benzonatate (TESSALON) capsule 100 mg (100 mg Oral Given 11/01/16 1931)  albuterol  (PROVENTIL HFA;VENTOLIN HFA) 108 (90 Base) MCG/ACT inhaler 2 puff (2 puffs Inhalation Given 11/01/16 2114)  iopamidol (ISOVUE-370) 76 % injection (100 mLs  Contrast Given 11/01/16 2209)    Initial Impression / Assessment and Plan / ED Course  I have reviewed the triage vital signs and the nursing notes.  66 y.o. male with above stated PMHx, HPI, and physical. PMHx of CAD, CHF, & hypothyroidism. Recent cough x1 week. Now with small volume hemoptysis and left lateral pinpoint pleuritic chest pain.  EKG showing no ST elevation/depression or T wave inversions. Troponin undetectable. LA wnl. Flu (-). CXR showing interstitial opacities but no consolidation. Dimer elevated. CTA chest showing no PE or PNA. Pt given Tessalon Perles, tylenol, afrin, and z-pak for presumed bronchitis.  Laboratory and imaging results were personally reviewed by myself and used in the medical decision making of this patient's treatment and disposition.  Pt discharged home in stable condition. Strict ED return precautions dicussed. Pt understands and agrees with the plan and has no further questions or concerns.   Pt care discussed with and followed by my attending, Dr. Dollene Cleveland, MD Pager [phone]  Final Clinical Impressions(s) / ED Diagnoses   Final diagnoses:  Bronchitis  Fever in adult  Cough   New Prescriptions New Prescriptions   AZITHROMYCIN (ZITHROMAX Z-PAK) 250 MG TABLET    Please take 2 tablets tonight then 1 tablet per day for 4 days   BENZONATATE (TESSALON PERLES) 100 MG CAPSULE    Take 1 capsule (100 mg total) by mouth 3 (three) times daily as needed for cough.   OXYMETAZOLINE (AFRIN NASAL SPRAY) 0.05 % NASAL SPRAY    Place 1 spray into both nostrils 2 (two) times daily. Do NOT use more than 3 days     Mayer Camel, MD 11/02/16 [id number]    Gareth Morgan, MD 11/06/16 [phone]

## 2016-11-01 NOTE — ED Triage Notes (Signed)
Cough X3 days, chest wall pain. Believes he may have caught a cold here. Pain is left anterior rib cage. Pain is reproducible only when coughing. Pt ambulatory with EMS.

## 2016-11-02 MED ORDER — OXYMETAZOLINE HCL 0.05 % NA SOLN: 1.0000 | Freq: Two times a day (BID) | NASAL | 0 refills | Status: DC

## 2016-11-02 MED ORDER — AZITHROMYCIN 250 MG PO TABS: ORAL_TABLET | ORAL | 0 refills | Status: DC

## 2016-11-02 MED ORDER — BENZONATATE 100 MG PO CAPS: 100.0000 mg | ORAL_CAPSULE | Freq: Three times a day (TID) | ORAL | 0 refills | Status: DC | PRN

## 2016-11-02 NOTE — ED Notes (Signed)
Pt stable, ambulatory, states understanding of discharge instructions 

## 2016-11-07 ENCOUNTER — Ambulatory Visit (INDEPENDENT_AMBULATORY_CARE_PROVIDER_SITE_OTHER): Payer: PPO | Admitting: Family Medicine

## 2016-11-07 ENCOUNTER — Encounter (INDEPENDENT_AMBULATORY_CARE_PROVIDER_SITE_OTHER): Payer: Self-pay

## 2016-11-07 ENCOUNTER — Encounter: Payer: Self-pay | Admitting: Family Medicine

## 2016-11-07 VITALS — BP 116/70 | HR 66 | Temp 98.3°F | Wt 243.2 lb

## 2016-11-07 DIAGNOSIS — J209 Acute bronchitis, unspecified: Secondary | ICD-10-CM | POA: Diagnosis not present

## 2016-11-07 DIAGNOSIS — G43109 Migraine with aura, not intractable, without status migrainosus: Secondary | ICD-10-CM | POA: Diagnosis not present

## 2016-11-07 DIAGNOSIS — M79605 Pain in left leg: Secondary | ICD-10-CM | POA: Diagnosis not present

## 2016-11-07 DIAGNOSIS — M7122 Synovial cyst of popliteal space [Baker], left knee: Secondary | ICD-10-CM | POA: Insufficient documentation

## 2016-11-07 MED ORDER — BENZONATATE 100 MG PO CAPS: 100.0000 mg | ORAL_CAPSULE | Freq: Three times a day (TID) | ORAL | 0 refills | Status: DC | PRN

## 2016-11-07 MED ORDER — SUMATRIPTAN SUCCINATE 50 MG PO TABS: 50.0000 mg | ORAL_TABLET | Freq: Once | ORAL | 3 refills | Status: DC

## 2016-11-07 MED ORDER — AMITRIPTYLINE HCL 25 MG PO TABS: 25.0000 mg | ORAL_TABLET | Freq: Every day | ORAL | 1 refills | Status: DC

## 2016-11-07 NOTE — Progress Notes (Signed)
BP 116/70   Pulse 66   Temp 98.3 F (36.8 C) (Oral)   Wt 243 lb 4 oz (110.3 kg)   SpO2 95%   BMI 35.41 kg/m    CC: ER f/u visit Subjective:    Patient ID: Nicholas [name], male    DOB: [birth year], 66 y.o.   MRN: [id number]  HPI: Nicholas [name] is a 66 y.o. male presenting on 11/07/2016 for Follow-up (ER)   Recent ER evaluation 11/01/2016 for productive cough, pleuritic chest pain, diagnosed with acute bronchitis. CTA negative for PE (d dimer was elevated). EKG and cardiac enzymes non acute. Treated with zpack, tessalon perls, tylenol, afrin. Tessalon perls are helping.   No fevers. Cough improved. Persistent nasal congestion.  Would like imitrex refill and tessalon refill.   Ongoing migraines - 3 migraines per week. Typical migraine with aura, phono/photophobia, nausea. Headaches described as sharp and throbbing frontal pain bilaterally. Unsure of triggers. Doesn't remember what preventative med he's taken in the past. Abortive therapy imitrex works well.   Ongoing L knee pain posterior knee worse with getting into and out of car, ongoing for the past month.   Relevant past medical, surgical, family and social history reviewed and updated as indicated. Interim medical history since our last visit reviewed. Allergies and medications reviewed and updated. Current Outpatient Prescriptions on File Prior to Visit  Medication Sig  . EPINEPHrine (EPIPEN 2-PAK) 0.3 mg/0.3 mL IJ SOAJ injection Inject 0.3 mg into the muscle once.  . ferrous sulfate 325 (65 FE) MG tablet Take 325 mg by mouth daily with breakfast.  . gabapentin (NEURONTIN) 300 MG capsule TAKE ONE CAPSULE BY MOUTH 4 TIMES A DAY  . HYDROcodone-acetaminophen (NORCO) 10-325 MG tablet Take 1 tablet by mouth as needed. Takes 1 in am and 1/2 at night  . hydrocortisone 2.5 % cream Apply 1 application topically at bedtime as needed (for rash).   [name] levothyroxine (SYNTHROID, LEVOTHROID) 25 MCG tablet Take 1 tablet (25 mcg total) by mouth  daily before breakfast.  . Multiple Vitamin (MULTIVITAMIN WITH MINERALS) TABS tablet Take 1 tablet by mouth daily. Centrum silver.  . mupirocin ointment (BACTROBAN) 2 % Place 1 application into the nose 2 (two) times daily.  [name] oxymetazoline (AFRIN NASAL SPRAY) 0.05 % nasal spray Place 1 spray into both nostrils 2 (two) times daily. Do NOT use more than 3 days  . pantoprazole (PROTONIX) 40 MG tablet Take 1 tablet (40 mg total) by mouth daily before breakfast.  . Probiotic Product (PROBIOTIC PO) Take 1 capsule by mouth daily.    No current facility-administered medications on file prior to visit.     Review of Systems Per HPI unless specifically indicated in ROS section     Objective:    BP 116/70   Pulse 66   Temp 98.3 F (36.8 C) (Oral)   Wt 243 lb 4 oz (110.3 kg)   SpO2 95%   BMI 35.41 kg/m   Wt Readings from Last 3 Encounters:  11/07/16 243 lb 4 oz (110.3 kg)  10/05/16 229 lb (103.9 kg)  08/21/16 218 lb 2 oz (98.9 kg)    Physical Exam  Constitutional: He appears well-developed and well-nourished. No distress.  HENT:  Head: Normocephalic and atraumatic.  Right Ear: Hearing normal.  Left Ear: Hearing normal.  Nose: No mucosal edema (nasal mucosal injection) or rhinorrhea. Right sinus exhibits no maxillary sinus tenderness and no frontal sinus tenderness. Left sinus exhibits no maxillary sinus tenderness and no frontal sinus  tenderness.  Mouth/Throat: Uvula is midline, oropharynx is clear and moist and mucous membranes are normal. No oropharyngeal exudate, posterior oropharyngeal edema, posterior oropharyngeal erythema or tonsillar abscesses.  Eyes: Conjunctivae and EOM are normal. Pupils are equal, round, and reactive to light. No scleral icterus.  Neck: Normal range of motion. Neck supple.  Cardiovascular: Normal rate, regular rhythm, normal heart sounds and intact distal pulses.   No murmur heard. Pulmonary/Chest: Effort normal and breath sounds normal. No respiratory  distress. He has no wheezes. He has no rales.  Musculoskeletal: He exhibits no edema.  No appreciable fullness of L popliteal area or palpable cords Tender to palpation at L lateral anterior knee  Lymphadenopathy:    He has no cervical adenopathy.  Skin: Skin is warm and dry. No rash noted.  Nursing note and vitals reviewed.  Results for orders placed or performed during the hospital encounter of 11/01/16  Influenza panel by PCR (type A & B)  Result Value Ref Range   Influenza A By PCR NEGATIVE NEGATIVE   Influenza B By PCR NEGATIVE NEGATIVE  CBC with Differential  Result Value Ref Range   WBC 9.1 4.0 - 10.5 K/uL   RBC 4.71 4.22 - 5.81 MIL/uL   Hemoglobin 13.9 13.0 - 17.0 g/dL   HCT 42.1 39.0 - 52.0 %   MCV 89.4 78.0 - 100.0 fL   MCH 29.5 26.0 - 34.0 pg   MCHC 33.0 30.0 - 36.0 g/dL   RDW 15.8 (H) 11.5 - 15.5 %   Platelets 191 150 - 400 K/uL   Neutrophils Relative % 62 %   Neutro Abs 5.7 1.7 - 7.7 K/uL   Lymphocytes Relative 15 %   Lymphs Abs 1.3 0.7 - 4.0 K/uL   Monocytes Relative 18 %   Monocytes Absolute 1.6 (H) 0.1 - 1.0 K/uL   Eosinophils Relative 4 %   Eosinophils Absolute 0.4 0.0 - 0.7 K/uL   Basophils Relative 1 %   Basophils Absolute 0.1 0.0 - 0.1 K/uL  Comprehensive metabolic panel  Result Value Ref Range   Sodium 139 135 - 145 mmol/L   Potassium 4.4 3.5 - 5.1 mmol/L   Chloride 105 101 - 111 mmol/L   CO2 21 (L) 22 - 32 mmol/L   Glucose, Bld 94 65 - 99 mg/dL   BUN 16 6 - 20 mg/dL   Creatinine, Ser 1.22 0.61 - 1.24 mg/dL   Calcium 9.0 8.9 - 10.3 mg/dL   Total Protein 6.9 6.5 - 8.1 g/dL   Albumin 3.9 3.5 - 5.0 g/dL   AST 22 15 - 41 U/L   ALT 17 17 - 63 U/L   Alkaline Phosphatase 75 38 - 126 U/L   Total Bilirubin 1.7 (H) 0.3 - 1.2 mg/dL   GFR calc non Af Amer >60 >60 mL/min   GFR calc Af Amer >60 >60 mL/min   Anion gap 13 5 - 15  D-dimer, quantitative (not at [facility])  Result Value Ref Range   D-Dimer, Quant 1.00 (H) 0.00 - 0.50 ug/mL-FEU  I-Stat CG4 Lactic  Acid, ED  Result Value Ref Range   Lactic Acid, Venous 1.66 0.5 - 1.9 mmol/L  I-stat troponin, ED  Result Value Ref Range   Troponin i, poc 0.00 0.00 - 0.08 ng/mL   Comment 3           Lab Results  Component Value Date   TSH 0.04 (L) 09/26/2016       Assessment & Plan:   Problem List  Items Addressed This Visit    Acute bronchitis - Primary    Recent ER evaluation for this - treated with zpack. Overall improving, ongoing nasal congestion.  Discussed further supportive care at home as per instructions - stop afrin, start plain mucinex. Refilled tessalon perls.        Left leg pain    Acute on chronic L knee pain of unclear cause. No palpable baker's cyst noted today, not consistent with DVT - no asymmetric swelling.  rec knee sleeve.  Update if no better for further evaluation.  In h/o recent elevated D dimer, will check venous US to r/o DVT.       Relevant Orders   US Venous Img Lower Unilateral Left   Migraine    Ongoing, not well controlled on current regimen of topamax 25mg  bid as evidenced by multiple migraines per week. Has not been taking amitriptyline. Will start 25mg  amitriptyline nightly, decrease topamax to 25mg  nightly (taper off). Refilled imitrex. RTC 1 mo f/u visit.       Relevant Medications   SUMAtriptan (IMITREX) 50 MG tablet   amitriptyline (ELAVIL) 25 MG tablet   topiramate (TOPAMAX) 25 MG tablet       Follow up plan: Return in about 4 weeks (around 12/05/2016) for follow up visit.  [name], MD

## 2016-11-07 NOTE — Assessment & Plan Note (Signed)
Ongoing, not well controlled on current regimen of topamax 25mg  bid as evidenced by multiple migraines per week. Has not been taking amitriptyline. Will start 25mg  amitriptyline nightly, decrease topamax to 25mg  nightly (taper off). Refilled imitrex. RTC 1 mo f/u visit.

## 2016-11-07 NOTE — Assessment & Plan Note (Addendum)
Acute on chronic L knee pain of unclear cause. No palpable baker's cyst noted today, not consistent with DVT - no asymmetric swelling.  rec knee sleeve.  Update if no better for further evaluation.  In h/o recent elevated D dimer, will check venous US to r/o DVT.

## 2016-11-07 NOTE — Progress Notes (Signed)
Pre visit review using our clinic review tool, if applicable. No additional management support is needed unless otherwise documented below in the visit note. 

## 2016-11-07 NOTE — Assessment & Plan Note (Signed)
Recent ER evaluation for this - treated with zpack. Overall improving, ongoing nasal congestion.  Discussed further supportive care at home as per instructions - stop afrin, start plain mucinex. Refilled tessalon perls.

## 2016-11-07 NOTE — Patient Instructions (Addendum)
Stop afrin. Take plain mucinex over the counter (no mucinex D) with glass of water to help with congestion. Tessalon and imitrex refilled.  Decrease topamax to 25mg  at bedtime only. Start amitriptyline 25mg  at bedtime - sent to pharmacy. This should help headache, sleep, and pain.  Return in 1 month for follow up visit.

## 2016-11-08 ENCOUNTER — Ambulatory Visit
Admission: RE | Admit: 2016-11-08 | Discharge: 2016-11-08 | Disposition: A | Discharge status: Home / Self Care | Payer: PPO | Source: Ambulatory Visit | Attending: Family Medicine | Admitting: Family Medicine

## 2016-11-08 ENCOUNTER — Telehealth: Payer: Self-pay

## 2016-11-08 DIAGNOSIS — M79605 Pain in left leg: Secondary | ICD-10-CM

## 2016-11-08 NOTE — Telephone Encounter (Signed)
Amber with [facility] imaging calls to report that patient's ultrasound to r/o DVT to left leg was negative; however, they do note a 5.7cm Baker's cyst behind the left knee.  Dr. Darnell Level made aware and okay for patient to leave.  Amber will communicate results with patient and that we will be following up.

## 2016-11-08 NOTE — Telephone Encounter (Signed)
Patient notified

## 2016-11-08 NOTE — Telephone Encounter (Signed)
plz notify patient - ultrasound showed large baker's cyst, no blood clot. rec treatment as discussed - use knee brace, elevate leg.

## 2016-11-12 DIAGNOSIS — F1120 Opioid dependence, uncomplicated: Secondary | ICD-10-CM | POA: Diagnosis not present

## 2016-11-12 DIAGNOSIS — M25561 Pain in right knee: Secondary | ICD-10-CM | POA: Diagnosis not present

## 2016-11-12 DIAGNOSIS — R51 Headache: Secondary | ICD-10-CM | POA: Diagnosis not present

## 2016-11-12 DIAGNOSIS — G8929 Other chronic pain: Secondary | ICD-10-CM | POA: Diagnosis not present

## 2016-11-12 DIAGNOSIS — M25511 Pain in right shoulder: Secondary | ICD-10-CM | POA: Diagnosis not present

## 2016-11-12 DIAGNOSIS — M25512 Pain in left shoulder: Secondary | ICD-10-CM | POA: Diagnosis not present

## 2016-11-12 DIAGNOSIS — G8928 Other chronic postprocedural pain: Secondary | ICD-10-CM | POA: Diagnosis not present

## 2016-11-12 DIAGNOSIS — G894 Chronic pain syndrome: Secondary | ICD-10-CM | POA: Diagnosis not present

## 2016-11-18 ENCOUNTER — Other Ambulatory Visit: Payer: Self-pay | Admitting: Family Medicine

## 2016-12-10 ENCOUNTER — Encounter: Payer: Self-pay | Admitting: Family Medicine

## 2016-12-10 ENCOUNTER — Ambulatory Visit (INDEPENDENT_AMBULATORY_CARE_PROVIDER_SITE_OTHER): Payer: PPO | Admitting: Family Medicine

## 2016-12-10 VITALS — BP 126/80 | HR 80 | Temp 98.3°F | Wt 257.8 lb

## 2016-12-10 DIAGNOSIS — M7122 Synovial cyst of popliteal space [Baker], left knee: Secondary | ICD-10-CM

## 2016-12-10 DIAGNOSIS — G8929 Other chronic pain: Secondary | ICD-10-CM | POA: Diagnosis not present

## 2016-12-10 DIAGNOSIS — R21 Rash and other nonspecific skin eruption: Secondary | ICD-10-CM | POA: Diagnosis not present

## 2016-12-10 DIAGNOSIS — E039 Hypothyroidism, unspecified: Secondary | ICD-10-CM | POA: Diagnosis not present

## 2016-12-10 DIAGNOSIS — G43109 Migraine with aura, not intractable, without status migrainosus: Secondary | ICD-10-CM | POA: Diagnosis not present

## 2016-12-10 DIAGNOSIS — G894 Chronic pain syndrome: Secondary | ICD-10-CM

## 2016-12-10 DIAGNOSIS — IMO0001 Reserved for inherently not codable concepts without codable children: Secondary | ICD-10-CM

## 2016-12-10 DIAGNOSIS — Z96651 Presence of right artificial knee joint: Secondary | ICD-10-CM

## 2016-12-10 DIAGNOSIS — E669 Obesity, unspecified: Secondary | ICD-10-CM

## 2016-12-10 DIAGNOSIS — M79604 Pain in right leg: Secondary | ICD-10-CM

## 2016-12-10 MED ORDER — AMITRIPTYLINE HCL 50 MG PO TABS: 50.0000 mg | ORAL_TABLET | Freq: Every day | ORAL | 1 refills | Status: DC

## 2016-12-10 MED ORDER — TOPIRAMATE 25 MG PO TABS
25.0000 mg | ORAL_TABLET | Freq: Every day | ORAL | 3 refills | Status: DC
Start: 2016-12-10 — End: 2017-01-07

## 2016-12-10 NOTE — Assessment & Plan Note (Signed)
Ongoing, not well controlled. Will continue cross taper from topamax to amitriptyline - decrease topamax to 25mg  daily, increase amitriptyline to 50mg  nightly. Pt agrees with plan. RTC 1 mo f/u visit.

## 2016-12-10 NOTE — Progress Notes (Signed)
Pre visit review using our clinic review tool, if applicable. No additional management support is needed unless otherwise documented below in the visit note. 

## 2016-12-10 NOTE — Assessment & Plan Note (Addendum)
Vasculitic vs petechial rash - ?med related. CBC WNL last month. Will continue to monitor.

## 2016-12-10 NOTE — Assessment & Plan Note (Signed)
Discussed fall 2 wks ago. Pt will look into life alert system through his home alert system.

## 2016-12-10 NOTE — Assessment & Plan Note (Addendum)
Tries to avoid narcotics, treating with PRN hydrocodone.

## 2016-12-10 NOTE — Assessment & Plan Note (Signed)
Doing well on 43mcg daily - will need TSH repeated at next visit.

## 2016-12-10 NOTE — Assessment & Plan Note (Addendum)
Acute on chronic L knee pain not improving despite brace and hydrocodone chronic pain regimen. Presumed due to baker's cyst found by [place].  Will refer back to ortho per pt request.

## 2016-12-10 NOTE — Patient Instructions (Addendum)
Decrease topamax to 25mg  in the morning.  Increase amitriptyline to 50mg  at bedtime.  We will monitor effect on migraines. imitrex refilled today.  We will refer you back to Dr [name] Sa to discuss left knee pain and baker's cyst.

## 2016-12-10 NOTE — Assessment & Plan Note (Signed)
Weight gain noted today 

## 2016-12-10 NOTE — Progress Notes (Signed)
BP 126/80   Pulse 80   Temp 98.3 F (36.8 C) (Oral)   Wt 257 lb 12 oz (116.9 kg)   BMI 37.52 kg/m    CC: 1 mo f/u visit Subjective:    Patient ID: Nicholas [name], male    DOB: Dec 07, 1950, 66 y.o.   MRN: [id number]  HPI: Nicholas [name] is a 66 y.o. male presenting on 12/10/2016 for Follow-up and Check leg   See prior note for details.   Seen last month with ongoing L knee pain and swelling, venous US negative for DVT but did show large baker's cyst.  Ongoing leg pains - chronic but acutely worsening.  Now with increased swelling of legs over the past month.   Several month history of rash on legs. Not itchy or tender. Not related to any new med. Denies change in detergent/soap/shampoo/lotions.  He had a fall 2 weeks ago where he was unable to get up. Deputy sherriff had to come to house to help him get up.   Migraines - last visit we started cross taper off topamax onto amitriptyline. He continued topamax 25mg  bid. He has tolerated amitriptyline 25mg  nightly. Continues imitrex abortively - has used 9 in the past month.   Hypothyroidism - compliant with levothyroxine 1mcg daily in am on empty stomach.   Relevant past medical, surgical, family and social history reviewed and updated as indicated. Interim medical history since our last visit reviewed. Allergies and medications reviewed and updated. Outpatient Medications Prior to Visit  Medication Sig Dispense Refill  . EPINEPHrine (EPIPEN 2-PAK) 0.3 mg/0.3 mL IJ SOAJ injection Inject 0.3 mg into the muscle once.    . ferrous sulfate 325 (65 FE) MG tablet Take 325 mg by mouth daily with breakfast.    . gabapentin (NEURONTIN) 300 MG capsule TAKE ONE CAPSULE BY MOUTH 4 TIMES A DAY 360 capsule 0  . HYDROcodone-acetaminophen (NORCO) 10-325 MG tablet Take 1 tablet by mouth as needed. Takes 1 in am and 1/2 at night    . hydrocortisone 2.5 % cream Apply 1 application topically at bedtime as needed (for rash).     [name] levothyroxine  (SYNTHROID, LEVOTHROID) 25 MCG tablet Take 1 tablet (25 mcg total) by mouth daily before breakfast. 30 tablet 6  . Multiple Vitamin (MULTIVITAMIN WITH MINERALS) TABS tablet Take 1 tablet by mouth daily. Centrum silver.    . mupirocin ointment (BACTROBAN) 2 % Place 1 application into the nose 2 (two) times daily. 22 g 0  . pantoprazole (PROTONIX) 40 MG tablet Take 1 tablet (40 mg total) by mouth daily before breakfast. 30 tablet 11  . Probiotic Product (PROBIOTIC PO) Take 1 capsule by mouth daily.     . SUMAtriptan (IMITREX) 50 MG tablet Take 1 tablet (50 mg total) by mouth once. May repeat in 2 hours if headache persists or recurs. 10 tablet 3  . amitriptyline (ELAVIL) 25 MG tablet Take 1 tablet (25 mg total) by mouth at bedtime. 90 tablet 1  . topiramate (TOPAMAX) 25 MG tablet TAKE 1 TABLET BY MOUTH TWICE A DAY (Patient taking differently: take one tablet daily) 180 tablet 0  . benzonatate (TESSALON PERLES) 100 MG capsule Take 1 capsule (100 mg total) by mouth 3 (three) times daily as needed for cough. 30 capsule 0  . oxymetazoline (AFRIN NASAL SPRAY) 0.05 % nasal spray Place 1 spray into both nostrils 2 (two) times daily. Do NOT use more than 3 days 30 mL 0  . topiramate (TOPAMAX) 25  MG tablet Take 1 tablet (25 mg total) by mouth at bedtime.     No facility-administered medications prior to visit.     Past Medical History:  Diagnosis Date  . Acute kidney failure 08/2008  . Allergy   . Asthma    ?of this  . Blood transfusion   . CHF (congestive heart failure) ([place])    ?of this, pt denies  . CLOSTRIDIUM DIFFICILE COLITIS 07/04/2010   Annotation: 12/09, 2/10 Qualifier: Diagnosis of  By: [name], [name]    . Depression with anxiety   . Duodenitis determined by biopsy 02/2016   peptic likely due to aleve (erosive gastropathy with duodenal erosions)  . ECZEMA 07/04/2010   Qualifier: Diagnosis of  By: [name], [name]    . GERD (gastroesophageal reflux disease)   . Heart attack    08/2008  (likely demand ischemia in the setting of MSSA sepsis/R TKA infection)  . History of stomach ulcers   . Hypothyroidism   . Lower GI bleeding   . MALAR AND MAXILLARY BONES CLOSED FRACTURE 07/04/2010   Annotation: ORIF Qualifier: Diagnosis of  By: [name], [name]    . Migraine    "definitely"  . MRSA (methicillin resistant Staphylococcus aureus)    in leg, had to place steel rod in leg  . Personal history of colonic adenoma 06/01/2003  . PFO (patent foramen ovale)    small PFO by 08/2008 TEE  . Pneumonia 08/2008   "while in ICU"  . Seizures ([place])    "long time ago"  . Stroke [facility])    unable to complete sentences at times    Past Surgical History:  Procedure Laterality Date  . anterior  nerve transposition  07/2009   left ulnar nerve  . antibiotic spacer exchange  11/2008; 08/2006   right knee  . ARTHROTOMY  08/2008   right knee w/I&D  . CATARACT EXTRACTION    . COLONOSCOPY  11/2012   diverticulosis, rpt 10 yrs [name])  . ESOPHAGOGASTRODUODENOSCOPY  02/2016   erosive gastropathy with duodenal erosions [name])  . HARDWARE REMOVAL  04/2006   right knee w/antibiotic spacers placed  . INGUINAL HERNIA REPAIR  early 1990's   bilateral  . JOINT REPLACEMENT    . KNEE ARTHROSCOPY  10/2001   right  . KNEE FUSION  03/2009   right knee removal; antibiotic spacers;   . REPLACEMENT TOTAL KNEE  08/2008; 09/2001   right  . SYNOVECTOMY  06/2005   debridement, liner exchange right knee  . TOE AMPUTATION Left 08/2008   great toe  . TOE AMPUTATION Left 04/2016   2nd toe [name]  . TONSILLECTOMY      Per HPI unless specifically indicated in ROS section below Review of Systems     Objective:    BP 126/80   Pulse 80   Temp 98.3 F (36.8 C) (Oral)   Wt 257 lb 12 oz (116.9 kg)   BMI 37.52 kg/m   Wt Readings from Last 3 Encounters:  12/10/16 257 lb 12 oz (116.9 kg)  11/07/16 243 lb 4 oz (110.3 kg)  10/05/16 229 lb (103.9 kg)    Physical Exam  Constitutional: He appears  well-developed and well-nourished. No distress.  Musculoskeletal: He exhibits no edema.  Diminished DP bilaterally S/p R knee fusion L knee tender to palpation with popliteal fullness  Ankle deformity present, tender to palpation medial ankle, with chronic swelling present  Skin: Skin is warm and dry. Rash noted.  nonblanching petechial rash  on legs  Nursing note and vitals reviewed.  Results for orders placed or performed during the hospital encounter of 11/01/16  Influenza panel by PCR (type A & B)  Result Value Ref Range   Influenza A By PCR NEGATIVE NEGATIVE   Influenza B By PCR NEGATIVE NEGATIVE  CBC with Differential  Result Value Ref Range   WBC 9.1 4.0 - 10.5 K/uL   RBC 4.71 4.22 - 5.81 MIL/uL   Hemoglobin 13.9 13.0 - 17.0 g/dL   HCT 42.1 39.0 - 52.0 %   MCV 89.4 78.0 - 100.0 fL   MCH 29.5 26.0 - 34.0 pg   MCHC 33.0 30.0 - 36.0 g/dL   RDW 15.8 (H) 11.5 - 15.5 %   Platelets 191 150 - 400 K/uL   Neutrophils Relative % 62 %   Neutro Abs 5.7 1.7 - 7.7 K/uL   Lymphocytes Relative 15 %   Lymphs Abs 1.3 0.7 - 4.0 K/uL   Monocytes Relative 18 %   Monocytes Absolute 1.6 (H) 0.1 - 1.0 K/uL   Eosinophils Relative 4 %   Eosinophils Absolute 0.4 0.0 - 0.7 K/uL   Basophils Relative 1 %   Basophils Absolute 0.1 0.0 - 0.1 K/uL  Comprehensive metabolic panel  Result Value Ref Range   Sodium 139 135 - 145 mmol/L   Potassium 4.4 3.5 - 5.1 mmol/L   Chloride 105 101 - 111 mmol/L   CO2 21 (L) 22 - 32 mmol/L   Glucose, Bld 94 65 - 99 mg/dL   BUN 16 6 - 20 mg/dL   Creatinine, Ser 1.22 0.61 - 1.24 mg/dL   Calcium 9.0 8.9 - 10.3 mg/dL   Total Protein 6.9 6.5 - 8.1 g/dL   Albumin 3.9 3.5 - 5.0 g/dL   AST 22 15 - 41 U/L   ALT 17 17 - 63 U/L   Alkaline Phosphatase 75 38 - 126 U/L   Total Bilirubin 1.7 (H) 0.3 - 1.2 mg/dL   GFR calc non Af Amer >60 >60 mL/min   GFR calc Af Amer >60 >60 mL/min   Anion gap 13 5 - 15  D-dimer, quantitative (not at [facility])  Result Value Ref Range    D-Dimer, Quant 1.00 (H) 0.00 - 0.50 ug/mL-FEU  I-Stat CG4 Lactic Acid, ED  Result Value Ref Range   Lactic Acid, Venous 1.66 0.5 - 1.9 mmol/L  I-stat troponin, ED  Result Value Ref Range   Troponin i, poc 0.00 0.00 - 0.08 ng/mL   Comment 3           Lab Results  Component Value Date   TSH 0.04 (L) 09/26/2016    LEFT LOWER EXTREMITY VENOUS DOPPLER ULTRASOUND Other Findings: Anechoic structure in the left popliteal region measures 5.7 cm x 1.6 cm x 4.1 cm. IMPRESSION: Sonographic survey of the left lower extremity negative for DVT. Likely left-sided Baker cyst. Signed, Dulcy Fanny. Earleen Newport, DO [facility] Electronically Signed   By: Corrie Mckusick D.O.   On: 11/08/2016 11:22    Assessment & Plan:   Problem List Items Addressed This Visit    Baker's cyst of knee, left - Primary    Acute on chronic L knee pain not improving despite brace and hydrocodone chronic pain regimen. Presumed due to baker's cyst found by [place].  Will refer back to ortho per pt request.       Relevant Orders   Ambulatory referral to Orthopedic Surgery   Chronic leg pain  Discussed fall 2 wks ago. Pt will look into life alert system through his home alert system.      Chronic pain syndrome    Tries to avoid narcotics, treating with PRN hydrocodone.       History of total right knee replacement   Hypothyroidism    Doing well on 81mcg daily - will need TSH repeated at next visit.      Migraine    Ongoing, not well controlled. Will continue cross taper from topamax to amitriptyline - decrease topamax to 25mg  daily, increase amitriptyline to 50mg  nightly. Pt agrees with plan. RTC 1 mo f/u visit.       Relevant Medications   topiramate (TOPAMAX) 25 MG tablet   amitriptyline (ELAVIL) 50 MG tablet   Obesity, Class II, BMI 35-39.9, with comorbidity    Weight gain noted today.       Skin rash    Vasculitic vs petechial rash - ?med related.  CBC WNL last month. Will continue to monitor.           Follow up plan: Return in about 4 weeks (around 01/07/2017), or if symptoms worsen or fail to improve, for follow up visit.  [name], MD

## 2016-12-11 ENCOUNTER — Ambulatory Visit (INDEPENDENT_AMBULATORY_CARE_PROVIDER_SITE_OTHER): Payer: PPO | Admitting: Orthopedic Surgery

## 2016-12-11 ENCOUNTER — Encounter (INDEPENDENT_AMBULATORY_CARE_PROVIDER_SITE_OTHER): Payer: Self-pay | Admitting: Orthopedic Surgery

## 2016-12-11 ENCOUNTER — Ambulatory Visit (INDEPENDENT_AMBULATORY_CARE_PROVIDER_SITE_OTHER): Payer: PPO

## 2016-12-11 DIAGNOSIS — M1712 Unilateral primary osteoarthritis, left knee: Secondary | ICD-10-CM

## 2016-12-11 NOTE — Progress Notes (Signed)
Office Visit Note   Patient: Nicholas [name]           Date of Birth: Mar 28, 1951           MRN: [id number] Visit Date: 12/11/2016 Requested by: [name], MD [place], [place] [address] PCP: [name], MD  Subjective: No chief complaint on file.   HPI Nicholas [name] is a 66 year old patient with left knee pain.  Denies any history of injury.  Has a right knee fusion from infected total knee replacement.  Denies any history of injury to the left knee.  Reports a pain and tearing sensation in the back of the knee with some radiation down to the calf.  He's doing well from his second toe amputation on the left-hand side.              Review of Systems All systems reviewed are negative as they relate to the chief complaint within the history of present illness.  Patient denies  fevers or chills.    Assessment & Plan: Visit Diagnoses: No diagnosis found.  Plan: Impression is left knee pain with component of arthritis present.  Mild effusion is present but there is no warmth.  His range of motion is reasonable collateral crucial ligaments are stable.  He also has some issues with the left ankle with posterior tib tendon deficiency.  He also has shoulder arthritis.  All of these ailments arc and she being too generally decreased quality of life.  I think [name] needs to decide when he wants to start getting some injections and/or surgery on these multiply affected joints.  I'll see him back as needed  Follow-Up Instructions: No Follow-up on file.   Orders:  No orders of the defined types were placed in this encounter.  No orders of the defined types were placed in this encounter.     Procedures: No procedures performed   Clinical Data: No additional findings.  Objective: Vital Signs: There were no vitals taken for this visit.  Physical Exam   Constitutional: Patient appears well-developed HEENT:  Head: Normocephalic Eyes:EOM are normal Neck:  Normal range of motion Cardiovascular: Normal rate Pulmonary/chest: Effort normal Neurologic: Patient is alert Skin: Skin is warm Psychiatric: Patient has normal mood and affect    Ortho Exam orthopedic exam demonstrates slightly antalgic gait to the left.  Both feet are perfused.  Left knee has an effusion but no warmth.  Extensor mechanism is intact on the left.  Stable collateral and cruciate ligaments.  No groin pain with internal rotation leg.  The paresthesias L1 S1 bilaterally.  No other masses lymph adenopathy or skin changes in the left knee region.  There is some planovalgus collapse of the left foot with standing.  Specialty Comments:  No specialty comments available.  Imaging: No results found.   PMFS History: Patient Active Problem List   Diagnosis Date Noted  . Skin rash 12/10/2016  . Acute bronchitis 11/07/2016  . Baker's cyst of knee, left 11/07/2016  . Medicare annual wellness visit, initial 10/05/2016  . Advanced care planning/counseling discussion 10/05/2016  . Nasal sore 10/05/2016  . Diarrhea 08/21/2016  . Obesity, Class II, BMI 35-39.9, with comorbidity 07/19/2016  . Hypothyroidism   . IDA (iron deficiency anemia) 04/23/2016  . Chronic pain syndrome 04/23/2016  . Inflammatory arthritis 04/23/2016  . Chronic leg pain 04/23/2016  . Lesion of palate 04/23/2016  . Grieving 04/23/2016  . GERD (gastroesophageal reflux disease)   . Duodenitis determined  by biopsy 02/13/2016  . Diverticulosis of colon without hemorrhage 01/31/2016  . Migraine 01/11/2012  . MRSA (methicillin resistant Staphylococcus aureus) infection 07/04/2010  . DEGENERATIVE DISC DISEASE 07/04/2010  . History of total right knee replacement 07/04/2010  . Osteoarthritis 06/09/2007  . Personal history of colonic adenoma 06/01/2003   Past Medical History:  Diagnosis Date  . Acute kidney failure 08/2008  . Allergy   . Asthma    ?of this  . Blood transfusion   . CHF (congestive heart  failure) ([place])    ?of this, pt denies  . CLOSTRIDIUM DIFFICILE COLITIS 07/04/2010   Annotation: 12/09, 2/10 Qualifier: Diagnosis of  By: [name], [name]    . Depression with anxiety   . Duodenitis determined by biopsy 02/2016   peptic likely due to aleve (erosive gastropathy with duodenal erosions)  . ECZEMA 07/04/2010   Qualifier: Diagnosis of  By: [name], [name]    . GERD (gastroesophageal reflux disease)   . Heart attack    08/2008 (likely demand ischemia in the setting of MSSA sepsis/R TKA infection)  . History of stomach ulcers   . Hypothyroidism   . Lower GI bleeding   . MALAR AND MAXILLARY BONES CLOSED FRACTURE 07/04/2010   Annotation: ORIF Qualifier: Diagnosis of  By: [name], [name]    . Migraine    "definitely"  . MRSA (methicillin resistant Staphylococcus aureus)    in leg, had to place steel rod in leg  . Personal history of colonic adenoma 06/01/2003  . PFO (patent foramen ovale)    small PFO by 08/2008 TEE  . Pneumonia 08/2008   "while in ICU"  . Seizures ([place])    "long time ago"  . Stroke [facility])    unable to complete sentences at times    Family History  Problem Relation Age of Onset  . Coronary artery disease Father   . Heart disease Mother   . Breast cancer Sister   . Diabetes Brother   . Diabetes Sister   . Colon cancer Neg Hx   . Stomach cancer Neg Hx     Past Surgical History:  Procedure Laterality Date  . anterior  nerve transposition  07/2009   left ulnar nerve  . antibiotic spacer exchange  11/2008; 08/2006   right knee  . ARTHROTOMY  08/2008   right knee w/I&D  . CATARACT EXTRACTION    . COLONOSCOPY  11/2012   diverticulosis, rpt 10 yrs [name])  . ESOPHAGOGASTRODUODENOSCOPY  02/2016   erosive gastropathy with duodenal erosions [name])  . HARDWARE REMOVAL  04/2006   right knee w/antibiotic spacers placed  . INGUINAL HERNIA REPAIR  early 1990's   bilateral  . JOINT REPLACEMENT    . KNEE ARTHROSCOPY  10/2001   right  . KNEE FUSION   03/2009   right knee removal; antibiotic spacers;   . REPLACEMENT TOTAL KNEE  08/2008; 09/2001   right  . SYNOVECTOMY  06/2005   debridement, liner exchange right knee  . TOE AMPUTATION Left 08/2008   great toe  . TOE AMPUTATION Left 04/2016   2nd toe [name]  . TONSILLECTOMY     Social History   Occupational History  . retired    Social History Main Topics  . Smoking status: Never Smoker  . Smokeless tobacco: Never Used  . Alcohol use No     Comment: "I abused alcohol; last drink  ~ 2005"  . Drug use: No  . Sexual activity: No

## 2016-12-14 ENCOUNTER — Telehealth: Payer: Self-pay | Admitting: *Deleted

## 2016-12-14 NOTE — Telephone Encounter (Signed)
Amitriptyline required PA. Completed and approved via Cover my meds. Pharmacy notified.

## 2016-12-17 DIAGNOSIS — G894 Chronic pain syndrome: Secondary | ICD-10-CM | POA: Diagnosis not present

## 2016-12-17 DIAGNOSIS — R51 Headache: Secondary | ICD-10-CM | POA: Diagnosis not present

## 2016-12-17 DIAGNOSIS — G8928 Other chronic postprocedural pain: Secondary | ICD-10-CM | POA: Diagnosis not present

## 2016-12-17 DIAGNOSIS — G8929 Other chronic pain: Secondary | ICD-10-CM | POA: Diagnosis not present

## 2016-12-17 DIAGNOSIS — F1120 Opioid dependence, uncomplicated: Secondary | ICD-10-CM | POA: Diagnosis not present

## 2016-12-17 DIAGNOSIS — M25561 Pain in right knee: Secondary | ICD-10-CM | POA: Diagnosis not present

## 2016-12-17 DIAGNOSIS — M25511 Pain in right shoulder: Secondary | ICD-10-CM | POA: Diagnosis not present

## 2016-12-17 DIAGNOSIS — M25512 Pain in left shoulder: Secondary | ICD-10-CM | POA: Diagnosis not present

## 2016-12-20 ENCOUNTER — Other Ambulatory Visit (INDEPENDENT_AMBULATORY_CARE_PROVIDER_SITE_OTHER): Payer: Self-pay | Admitting: Orthopedic Surgery

## 2016-12-20 DIAGNOSIS — M1712 Unilateral primary osteoarthritis, left knee: Secondary | ICD-10-CM

## 2016-12-21 DIAGNOSIS — D509 Iron deficiency anemia, unspecified: Secondary | ICD-10-CM | POA: Diagnosis not present

## 2016-12-21 DIAGNOSIS — F329 Major depressive disorder, single episode, unspecified: Secondary | ICD-10-CM | POA: Diagnosis not present

## 2016-12-21 DIAGNOSIS — K219 Gastro-esophageal reflux disease without esophagitis: Secondary | ICD-10-CM | POA: Diagnosis not present

## 2016-12-21 DIAGNOSIS — E032 Hypothyroidism due to medicaments and other exogenous substances: Secondary | ICD-10-CM | POA: Diagnosis not present

## 2016-12-21 DIAGNOSIS — E039 Hypothyroidism, unspecified: Secondary | ICD-10-CM | POA: Diagnosis not present

## 2016-12-26 DIAGNOSIS — E032 Hypothyroidism due to medicaments and other exogenous substances: Secondary | ICD-10-CM | POA: Diagnosis not present

## 2017-01-01 ENCOUNTER — Telehealth (INDEPENDENT_AMBULATORY_CARE_PROVIDER_SITE_OTHER): Payer: Self-pay | Admitting: Orthopedic Surgery

## 2017-01-01 NOTE — Telephone Encounter (Signed)
Baker Janus from [facility] called stating pts orders needed to be signed. Has pre-op with them on the 23rd. Thank you!

## 2017-01-04 ENCOUNTER — Ambulatory Visit (HOSPITAL_COMMUNITY)
Admission: RE | Admit: 2017-01-04 | Discharge: 2017-01-04 | Disposition: A | Discharge status: Home / Self Care | Payer: PPO | Source: Ambulatory Visit | Attending: Orthopedic Surgery | Admitting: Orthopedic Surgery

## 2017-01-04 ENCOUNTER — Telehealth (INDEPENDENT_AMBULATORY_CARE_PROVIDER_SITE_OTHER): Payer: Self-pay | Admitting: Orthopedic Surgery

## 2017-01-04 ENCOUNTER — Encounter (HOSPITAL_COMMUNITY): Payer: Self-pay

## 2017-01-04 ENCOUNTER — Encounter (HOSPITAL_COMMUNITY)
Admission: RE | Admit: 2017-01-04 | Discharge: 2017-01-04 | Disposition: A | Discharge status: Home / Self Care | Payer: PPO | Source: Ambulatory Visit | Attending: Orthopedic Surgery | Admitting: Orthopedic Surgery

## 2017-01-04 DIAGNOSIS — M1712 Unilateral primary osteoarthritis, left knee: Secondary | ICD-10-CM | POA: Insufficient documentation

## 2017-01-04 DIAGNOSIS — Z01818 Encounter for other preprocedural examination: Secondary | ICD-10-CM | POA: Insufficient documentation

## 2017-01-04 DIAGNOSIS — Z419 Encounter for procedure for purposes other than remedying health state, unspecified: Secondary | ICD-10-CM

## 2017-01-04 HISTORY — DX: Unspecified osteoarthritis, unspecified site: M19.90

## 2017-01-04 HISTORY — DX: Personal history of other diseases of the digestive system: Z87.19

## 2017-01-04 LAB — CBC
HCT: 44.3 % (ref 39.0–52.0)
Hemoglobin: 14.6 g/dL (ref 13.0–17.0)
MCH: 30 pg (ref 26.0–34.0)
MCHC: 33 g/dL (ref 30.0–36.0)
MCV: 91.2 fL (ref 78.0–100.0)
PLATELETS: 178 10*3/uL (ref 150–400)
RBC: 4.86 MIL/uL (ref 4.22–5.81)
RDW: 14.1 % (ref 11.5–15.5)
WBC: 8.2 10*3/uL (ref 4.0–10.5)

## 2017-01-04 LAB — BASIC METABOLIC PANEL
Anion gap: 9 (ref 5–15)
BUN: 16 mg/dL (ref 6–20)
CALCIUM: 8.8 mg/dL — AB (ref 8.9–10.3)
CO2: 25 mmol/L (ref 22–32)
CREATININE: 1.2 mg/dL (ref 0.61–1.24)
Chloride: 104 mmol/L (ref 101–111)
Glucose, Bld: 101 mg/dL — ABNORMAL HIGH (ref 65–99)
Potassium: 4.3 mmol/L (ref 3.5–5.1)
SODIUM: 138 mmol/L (ref 135–145)

## 2017-01-04 LAB — SURGICAL PCR SCREEN
MRSA, PCR: NEGATIVE
STAPHYLOCOCCUS AUREUS: NEGATIVE

## 2017-01-04 NOTE — Telephone Encounter (Signed)
Please review note from [facility] Pre-op admissions.

## 2017-01-04 NOTE — Telephone Encounter (Signed)
Pre-admissions called from [facility] today and stated that the pt was unable to give a urine sample at pre op visit. Wanted to know if the pt needs to come back in and have this done prior to surgery? Also they did send the pt home with a urine specimen cup to give a sample and bring with him day of surgery from my understanding. If pt needs to come back in to give a UA prior to surgery the hospital stated for [place] to call the pt.

## 2017-01-04 NOTE — Telephone Encounter (Signed)
Notified patient.

## 2017-01-04 NOTE — Progress Notes (Signed)
Patient unable to obtain ua/culture while at preadm visit. Called office and taren stated would inform dt dean

## 2017-01-04 NOTE — Telephone Encounter (Signed)
Need ua sample pls clal htx

## 2017-01-04 NOTE — Pre-Procedure Instructions (Addendum)
Nicholas [name]  01/04/2017      [id number] - [place], [place] - [address] [place] [address] Phone: [phone] Fax: [phone]  [id number] - , [address] [place] [address] Phone: [phone] Fax: [phone]    Your procedure is scheduled on 01/15/17.  Report to [facility] at [phone].  Call this number if you have problems the morning of surgery:  [phone]   Remember:  Do not eat food or drink liquids after midnight.  Take these medicines the morning of surgery with A SIP OF WATER     Gabapentin, levothyroxine,pantoprazole(protonix),hydrocodone if needed imitrex if needed  STOP all herbel meds, nsaids (aleve,naproxen,advil,ibuprofen) 7 days prior to surgery starting 01/08/17 including all vitamins/supplements, aspirin, probiotic    Do not wear jewelry, make-up or nail polish.  Do not wear lotions, powders, or perfumes, or deoderant.  Do not shave 48 hours prior to surgery.  Men may shave face and neck.  Do not bring valuables to the hospital.  [facility] is not responsible for any belongings or valuables.  Contacts, dentures or bridgework may not be worn into surgery.  Leave your suitcase in the car.  After surgery it may be brought to your room.  For patients admitted to the hospital, discharge time will be determined by your treatment team.  Patients discharged the day of surgery will not be allowed to drive home.   Special instructions:   Special Instructions: [facility] - Preparing for Surgery  Before surgery, you can play an important role.  Because skin is not sterile, your skin needs to be as free of germs as possible.  You can reduce the number of germs on you skin by washing with CHG (chlorahexidine gluconate) soap before surgery.  CHG is an antiseptic cleaner which kills germs and bonds with the  skin to continue killing germs even after washing.  Please DO NOT use if you have an allergy to CHG or antibacterial soaps.  If your skin becomes reddened/irritated stop using the CHG and inform your nurse when you arrive at Short Stay.  Do not shave (including legs and underarms) for at least 48 hours prior to the first CHG shower.  You may shave your face.  Please follow these instructions carefully:   1.  Shower with CHG Soap the night before surgery and the morning of Surgery.  2.  If you choose to wash your hair, wash your hair first as usual with your normal shampoo.  3.  After you shampoo, rinse your hair and body thoroughly to remove the Shampoo.  4.  Use CHG as you would any other liquid soap.  You can apply chg directly  to the skin and wash gently with scrungie or a clean washcloth.  5.  Apply the CHG Soap to your body ONLY FROM THE NECK DOWN.  Do not use on open wounds or open sores.  Avoid contact with your eyes ears, mouth and genitals (private parts).  Wash genitals (private parts)       with your normal soap.  6.  Wash thoroughly, paying special attention to the area where your surgery will be performed.  7.  Thoroughly rinse your body with warm water from the neck down.  8.  DO NOT shower/wash with your normal soap after using and rinsing off the CHG Soap.  9.  Pat yourself dry with a clean towel.            10.  Wear clean pajamas.            11.  Place clean sheets on your bed the night of your first shower and do not sleep with pets.  Day of Surgery  Do not apply any lotions/deodorants the morning of surgery.  Please wear clean clothes to the hospital/surgery center.  Please read over the  fact sheets that you were given.

## 2017-01-07 ENCOUNTER — Ambulatory Visit (INDEPENDENT_AMBULATORY_CARE_PROVIDER_SITE_OTHER): Payer: PPO | Admitting: Family Medicine

## 2017-01-07 ENCOUNTER — Encounter: Payer: Self-pay | Admitting: Family Medicine

## 2017-01-07 ENCOUNTER — Encounter (HOSPITAL_COMMUNITY)
Admission: RE | Admit: 2017-01-07 | Discharge: 2017-01-07 | Disposition: A | Discharge status: Home / Self Care | Payer: PPO | Source: Ambulatory Visit | Attending: Orthopedic Surgery | Admitting: Orthopedic Surgery

## 2017-01-07 VITALS — BP 122/78 | HR 96 | Temp 98.3°F | Wt 266.2 lb

## 2017-01-07 DIAGNOSIS — M150 Primary generalized (osteo)arthritis: Secondary | ICD-10-CM

## 2017-01-07 DIAGNOSIS — M79604 Pain in right leg: Secondary | ICD-10-CM | POA: Diagnosis not present

## 2017-01-07 DIAGNOSIS — Z01812 Encounter for preprocedural laboratory examination: Secondary | ICD-10-CM | POA: Diagnosis not present

## 2017-01-07 DIAGNOSIS — G894 Chronic pain syndrome: Secondary | ICD-10-CM | POA: Diagnosis not present

## 2017-01-07 DIAGNOSIS — E039 Hypothyroidism, unspecified: Secondary | ICD-10-CM

## 2017-01-07 DIAGNOSIS — M1990 Unspecified osteoarthritis, unspecified site: Secondary | ICD-10-CM | POA: Diagnosis not present

## 2017-01-07 DIAGNOSIS — E669 Obesity, unspecified: Secondary | ICD-10-CM | POA: Diagnosis not present

## 2017-01-07 DIAGNOSIS — IMO0001 Reserved for inherently not codable concepts without codable children: Secondary | ICD-10-CM

## 2017-01-07 DIAGNOSIS — M159 Polyosteoarthritis, unspecified: Secondary | ICD-10-CM

## 2017-01-07 DIAGNOSIS — G43109 Migraine with aura, not intractable, without status migrainosus: Secondary | ICD-10-CM | POA: Diagnosis not present

## 2017-01-07 DIAGNOSIS — G8929 Other chronic pain: Secondary | ICD-10-CM

## 2017-01-07 LAB — URINALYSIS, ROUTINE W REFLEX MICROSCOPIC
BILIRUBIN URINE: NEGATIVE
Glucose, UA: NEGATIVE mg/dL
Hgb urine dipstick: NEGATIVE
Ketones, ur: NEGATIVE mg/dL
Leukocytes, UA: NEGATIVE
Nitrite: NEGATIVE
PROTEIN: NEGATIVE mg/dL
SPECIFIC GRAVITY, URINE: 1.023 (ref 1.005–1.030)
pH: 6 (ref 5.0–8.0)

## 2017-01-07 MED ORDER — SUMATRIPTAN SUCCINATE 50 MG PO TABS: 50.0000 mg | ORAL_TABLET | Freq: Once | ORAL | 3 refills | Status: DC

## 2017-01-07 MED ORDER — AMITRIPTYLINE HCL 25 MG PO TABS: 75.0000 mg | ORAL_TABLET | Freq: Every day | ORAL | 1 refills | Status: DC

## 2017-01-07 NOTE — Patient Instructions (Addendum)
Increase amitriptyline to 75mg  at bedtime. Sign release for latest thyroid from ortho (Dr [name] Continue levothyroxine 27mcg daily.  Return in 2 months for follow up Good luck with upcoming surgery!

## 2017-01-07 NOTE — Assessment & Plan Note (Signed)
Persistent. Now off topamax without deterioration in control. Continues 10+ imitrex per month. Will increase amitriptyline to 75mg  nightly.

## 2017-01-07 NOTE — Progress Notes (Signed)
Pre visit review using our clinic review tool, if applicable. No additional management support is needed unless otherwise documented below in the visit note. 

## 2017-01-07 NOTE — Assessment & Plan Note (Signed)
Chronic, severe. Sees ortho Therapist, occupational) and rheum ([place])

## 2017-01-07 NOTE — Progress Notes (Signed)
BP 122/78   Pulse 96   Temp 98.3 F (36.8 C) (Oral)   Wt 266 lb 4 oz (120.8 kg)   BMI 39.32 kg/m    CC: f/u visit Subjective:    Patient ID: Nicholas [name], male    DOB: [birth year], 66 y.o.   MRN: [id number]  HPI: MEDHANSH BRINKMEIER is a 66 y.o. male presenting on 01/07/2017 for Follow-up   See prior note for details. We referred back to ortho for L baker's cyst with worsening pain. saw Dr [name] Sa - upcoming surgery next Tuesday with Dr [name] Sa for L knee replacement. Predominant concern is repeat of what happened with R knee - infection then multiple revisions resulting in complete loss ROM.   Migraines - last visit we started cross taper off topamax onto amitriptyline. Current dose is topamax 25mg  nightly, amitriptyline 50mg  nightly. Continues imitrex abortively - has used 9 in the past month.   Hypothyroidism - compliant with levothyroxine 47mcg daily - was told TSH was 84 with ortho ? so he increased levothyroxine to 49mcg daily - taking for last 3 weeks.   Relevant past medical, surgical, family and social history reviewed and updated as indicated. Interim medical history since our last visit reviewed. Allergies and medications reviewed and updated. Outpatient Medications Prior to Visit  Medication Sig Dispense Refill  . ferrous gluconate (IRON 27) 240 (27 FE) MG tablet Take 240 mg by mouth daily.    [name] gabapentin (NEURONTIN) 300 MG capsule TAKE ONE CAPSULE BY MOUTH 4 TIMES A DAY 360 capsule 0  . HYDROcodone-acetaminophen (NORCO) 10-325 MG tablet Take 0.5 tablet in the morning and 1 tablet at bedtime    . Multiple Vitamin (MULTIVITAMIN WITH MINERALS) TABS tablet Take 1 tablet by mouth daily. Centrum silver.    . pantoprazole (PROTONIX) 40 MG tablet Take 1 tablet (40 mg total) by mouth daily before breakfast. 30 tablet 11  . Probiotic Product (PROBIOTIC PO) Take 1 capsule by mouth daily.     [name] amitriptyline (ELAVIL) 25 MG tablet Take 50 mg by mouth at bedtime.  1  . levothyroxine  (SYNTHROID, LEVOTHROID) 25 MCG tablet Take 1 tablet (25 mcg total) by mouth daily before breakfast. (Patient taking differently: Take 50 mcg by mouth daily before breakfast. ) 30 tablet 6  . SUMAtriptan (IMITREX) 50 MG tablet Take 1 tablet (50 mg total) by mouth once. May repeat in 2 hours if headache persists or recurs. 10 tablet 3  . EPINEPHrine (EPIPEN 2-PAK) 0.3 mg/0.3 mL IJ SOAJ injection Inject 0.3 mg into the muscle as directed.     . hydrocortisone 2.5 % cream Apply 1 application topically at bedtime as needed (for rash).     . mupirocin ointment (BACTROBAN) 2 % Place 1 application into the nose 2 (two) times daily. (Patient not taking: Reported on 01/01/2017) 22 g 0  . amitriptyline (ELAVIL) 50 MG tablet Take 1 tablet (50 mg total) by mouth at bedtime. (Patient not taking: Reported on 01/01/2017) 90 tablet 1  . topiramate (TOPAMAX) 25 MG tablet Take 1 tablet (25 mg total) by mouth daily. (Patient not taking: Reported on 01/01/2017) 30 tablet 3   No facility-administered medications prior to visit.      Per HPI unless specifically indicated in ROS section below Review of Systems     Objective:    BP 122/78   Pulse 96   Temp 98.3 F (36.8 C) (Oral)   Wt 266 lb 4 oz (120.8 kg)   BMI 39.32  kg/m   Wt Readings from Last 3 Encounters:  01/07/17 266 lb 4 oz (120.8 kg)  01/04/17 261 lb 12.8 oz (118.8 kg)  12/10/16 257 lb 12 oz (116.9 kg)    Physical Exam  Constitutional: He appears well-developed and well-nourished. No distress.  HENT:  Mouth/Throat: Oropharynx is clear and moist. No oropharyngeal exudate.  Cardiovascular: Normal rate, regular rhythm, normal heart sounds and intact distal pulses.   No murmur heard. Pulmonary/Chest: Effort normal and breath sounds normal. No respiratory distress. He has no wheezes. He has no rales.  Musculoskeletal: He exhibits no edema.  Skin: Skin is warm and dry. No rash noted.  Psychiatric: He has a normal mood and affect.  Nursing note and  vitals reviewed.  Results for orders placed or performed during the hospital encounter of 01/07/17  Urinalysis, Routine w reflex microscopic  Result Value Ref Range   Color, Urine YELLOW YELLOW   APPearance CLEAR CLEAR   Specific Gravity, Urine 1.023 1.005 - 1.030   pH 6.0 5.0 - 8.0   Glucose, UA NEGATIVE NEGATIVE mg/dL   Hgb urine dipstick NEGATIVE NEGATIVE   Bilirubin Urine NEGATIVE NEGATIVE   Ketones, ur NEGATIVE NEGATIVE mg/dL   Protein, ur NEGATIVE NEGATIVE mg/dL   Nitrite NEGATIVE NEGATIVE   Leukocytes, UA NEGATIVE NEGATIVE   Lab Results  Component Value Date   TSH 0.04 (L) 09/26/2016      Assessment & Plan:   Problem List Items Addressed This Visit    Chronic leg pain   Chronic pain syndrome   Hypothyroidism    Pt endorses elevated TSH reading to 84 at recent ortho check. He has increased levothyroxine to 30mcg since then. My last check, TSH 0.04 (09/2016). I have requested latest labs from ortho. For now, continue 33mcg dose.       Relevant Medications   levothyroxine (SYNTHROID, LEVOTHROID) 25 MCG tablet   Inflammatory arthritis   Migraine - Primary    Persistent. Now off topamax without deterioration in control. Continues 10+ imitrex per month. Will increase amitriptyline to 75mg  nightly.       Relevant Medications   SUMAtriptan (IMITREX) 50 MG tablet   amitriptyline (ELAVIL) 25 MG tablet   Obesity, Class II, BMI 35-39.9, with comorbidity    Discussed diet changes to affect sustainable weight loss, esp in setting of upcoming surgery.       Osteoarthritis    Chronic, severe. Sees ortho [name] and rheum ([place])          Follow up plan: Return in about 2 months (around 03/09/2017).  [name], MD

## 2017-01-07 NOTE — Assessment & Plan Note (Signed)
Pt endorses elevated TSH reading to 84 at recent ortho check. He has increased levothyroxine to 25mcg since then. My last check, TSH 0.04 (09/2016). I have requested latest labs from ortho. For now, continue 67mcg dose.

## 2017-01-07 NOTE — Assessment & Plan Note (Signed)
Discussed diet changes to affect sustainable weight loss, esp in setting of upcoming surgery.

## 2017-01-08 DIAGNOSIS — E032 Hypothyroidism due to medicaments and other exogenous substances: Secondary | ICD-10-CM | POA: Diagnosis not present

## 2017-01-08 DIAGNOSIS — E039 Hypothyroidism, unspecified: Secondary | ICD-10-CM | POA: Diagnosis not present

## 2017-01-08 LAB — URINE CULTURE: Culture: <10000 — AB

## 2017-01-08 NOTE — Progress Notes (Signed)
Anesthesia Chart Review:  Pt is a 66 year old male scheduled for L total knee arthroplasty on 01/15/2017 with Meredith Pel, M.D.  - PCP is [name], M.D. who is aware of upcoming surgery. - Has been seen at [facility] on and off over last several years for atypical chest pain, most recently on 06/01/15, f/u prn recommended.   PMH includes: CHF, PFO (seen on TEE 09/02/08, documented as "very small"), asthma, hypothyroidism, remote hx seizures, GERD. Never smoker. BMI 39. S/p R TKA 09/18/01.   Medications include: iron, protonix.   Preoperative labs reviewed.    CXR 01/04/17: No active cardiopulmonary disease. Mild degenerative changes mid thoracic spine.  EKG 11/01/16: NSR.   If no changes, I anticipate pt can proceed with surgery as scheduled.   Willeen Cass, FNP-BC [facility]/Anesthesiology Phone: [phone] 01/08/2017 2:38 PM

## 2017-01-14 DIAGNOSIS — F1120 Opioid dependence, uncomplicated: Secondary | ICD-10-CM | POA: Diagnosis not present

## 2017-01-14 DIAGNOSIS — G8929 Other chronic pain: Secondary | ICD-10-CM | POA: Diagnosis not present

## 2017-01-14 DIAGNOSIS — M25512 Pain in left shoulder: Secondary | ICD-10-CM | POA: Diagnosis not present

## 2017-01-14 DIAGNOSIS — M25569 Pain in unspecified knee: Secondary | ICD-10-CM | POA: Diagnosis not present

## 2017-01-14 DIAGNOSIS — M25511 Pain in right shoulder: Secondary | ICD-10-CM | POA: Diagnosis not present

## 2017-01-14 DIAGNOSIS — R51 Headache: Secondary | ICD-10-CM | POA: Diagnosis not present

## 2017-01-14 DIAGNOSIS — G894 Chronic pain syndrome: Secondary | ICD-10-CM | POA: Diagnosis not present

## 2017-01-14 DIAGNOSIS — G8928 Other chronic postprocedural pain: Secondary | ICD-10-CM | POA: Diagnosis not present

## 2017-01-14 DIAGNOSIS — Z79899 Other long term (current) drug therapy: Secondary | ICD-10-CM | POA: Diagnosis not present

## 2017-01-14 MED ORDER — DEXTROSE 5 % IV SOLN
3.0000 g | INTRAVENOUS | Status: DC
  Filled 2017-01-14: qty 3000

## 2017-01-14 NOTE — H&P (Signed)
TOTAL KNEE ADMISSION H&P  Patient is being admitted for left total knee arthroplasty.  Subjective:  Chief Complaint:left knee pain.  HPI: Nicholas [name], 66 y.o. male, has a history of pain and functional disability in the left knee due to arthritis and has failed non-surgical conservative treatments for greater than 12 weeks to includeNSAID's and/or analgesics, corticosteriod injections, viscosupplementation injections and activity modification.  Onset of symptoms was gradual, starting >10 years ago with gradually worsening course since that time. The patient noted no past surgery on the left knee(s).  Patient currently rates pain in the left knee(s) at 9 out of 10 with activity. Patient has night pain, worsening of pain with activity and weight bearing, pain that interferes with activities of daily living, pain with passive range of motion, crepitus and joint swelling.  Patient has evidence of subchondral cysts, subchondral sclerosis and joint space narrowing by imaging studies. This patient has had a knee fusion on the right-hand side but currently has no evidence of active infection.. There is no active infection.  Patient Active Problem List   Diagnosis Date Noted  . Skin rash 12/10/2016  . Acute bronchitis 11/07/2016  . Baker's cyst of knee, left 11/07/2016  . Medicare annual wellness visit, initial 10/05/2016  . Advanced care planning/counseling discussion 10/05/2016  . Nasal sore 10/05/2016  . Diarrhea 08/21/2016  . Obesity, Class II, BMI 35-39.9, with comorbidity 07/19/2016  . Hypothyroidism   . IDA (iron deficiency anemia) 04/23/2016  . Chronic pain syndrome 04/23/2016  . Inflammatory arthritis 04/23/2016  . Chronic leg pain 04/23/2016  . Lesion of palate 04/23/2016  . Grieving 04/23/2016  . GERD (gastroesophageal reflux disease)   . Duodenitis determined by biopsy 02/13/2016  . Diverticulosis of colon without hemorrhage 01/31/2016  . Migraine 01/11/2012  . MRSA (methicillin  resistant Staphylococcus aureus) infection 07/04/2010  . DEGENERATIVE DISC DISEASE 07/04/2010  . History of total right knee replacement 07/04/2010  . Osteoarthritis 06/09/2007  . Personal history of colonic adenoma 06/01/2003   Past Medical History:  Diagnosis Date  . Acute kidney failure 08/2008   "cleared up"  no problems since  . Allergy   . Arthritis   . Asthma    ?of this no inhaler  . Blood transfusion   . CHF (congestive heart failure) ([place])    ?of this, pt denies  . CLOSTRIDIUM DIFFICILE COLITIS 07/04/2010   Annotation: 12/09, 2/10 Qualifier: Diagnosis of  By: [name], [name]    . Depression with anxiety   . Duodenitis determined by biopsy 02/2016   peptic likely due to aleve (erosive gastropathy with duodenal erosions)  . ECZEMA 07/04/2010   Qualifier: Diagnosis of  By: [name], [name]    . GERD (gastroesophageal reflux disease)   . Heart attack    08/2008 (likely demand ischemia in the setting of MSSA sepsis/R TKA infection)  . History of hiatal hernia   . History of stomach ulcers   . Hypothyroidism   . Lower GI bleeding   . MALAR AND MAXILLARY BONES CLOSED FRACTURE 07/04/2010   Annotation: ORIF Qualifier: Diagnosis of  By: [name], [name]    . Migraine    "definitely"  . MRSA (methicillin resistant Staphylococcus aureus)    in leg, had to place steel rod in leg  . Personal history of colonic adenoma 06/01/2003  . PFO (patent foramen ovale)    small PFO by 08/2008 TEE  . Pneumonia 08/2008   "while in ICU"  . Seizures ([place]) 2009   "  long time ago"    . Stroke [facility] 2010   unable to complete sentences at times    Past Surgical History:  Procedure Laterality Date  . anterior  nerve transposition  07/2009   left ulnar nerve  . antibiotic spacer exchange  11/2008; 08/2006   right knee  . ARTHROTOMY  08/2008   right knee w/I&D  . CATARACT EXTRACTION    . COLONOSCOPY  11/2012   diverticulosis, rpt 10 yrs [name])  . ESOPHAGOGASTRODUODENOSCOPY  02/2016    erosive gastropathy with duodenal erosions [name])  . HARDWARE REMOVAL  04/2006   right knee w/antibiotic spacers placed  . INGUINAL HERNIA REPAIR  early 1990's   bilateral  . JOINT REPLACEMENT Right    rt knee  . KNEE ARTHROSCOPY  10/2001   right  . KNEE FUSION  03/2009   right knee removal; antibiotic spacers;   . REPLACEMENT TOTAL KNEE  08/2008; 09/2001   right  . rod placement Right   . SYNOVECTOMY  06/2005   debridement, liner exchange right knee  . TOE AMPUTATION Left 08/2008   great toe  . TOE AMPUTATION Left 04/2016   2nd toe [name]  . TONSILLECTOMY      No prescriptions prior to admission.   Allergies  Allergen Reactions  . Bee Venom Anaphylaxis    Respiratory Distress  . Peanut-Containing Drug Products Anaphylaxis and Dermatitis  . Celebrex [Celecoxib] Other (See Comments)    BLACK STOOL?MELENA?  [name] Percocet [Oxycodone-Acetaminophen] Hives and Swelling  . Latex Rash  . Nsaids Other (See Comments)    Disagrees with system    Social History  Substance Use Topics  . Smoking status: Never Smoker  . Smokeless tobacco: Never Used  . Alcohol use No     Comment: "I abused alcohol; last drink  ~ 2005"    Family History  Problem Relation Age of Onset  . Coronary artery disease Father   . Heart disease Mother   . Breast cancer Sister   . Diabetes Brother   . Diabetes Sister   . Colon cancer Neg Hx   . Stomach cancer Neg Hx      Review of Systems  Constitutional: Negative.   HENT: Negative.   Eyes: Negative.   Respiratory: Negative.   Cardiovascular: Negative.   Gastrointestinal: Negative.   Genitourinary: Negative.   Musculoskeletal: Positive for joint pain.  Skin: Negative.   Neurological: Negative.   Endo/Heme/Allergies: Negative.   Psychiatric/Behavioral: Negative.     Objective:  Physical Exam  Constitutional: He appears well-developed.  HENT:  Head: Normocephalic.  Eyes: Pupils are equal, round, and reactive to light.  Neck: Normal range of  motion.  Cardiovascular: Normal rate.   Respiratory: Effort normal.  Neurological: He is alert.  Skin: Skin is warm.  Psychiatric: He has a normal mood and affect.   left knee demonstrates medial and lateral joint line tenderness intact extensor mechanism trace effusion palpable pedal pulses about 5-10 flexion contracture with flexion past 90 collaterals are stable.  No groin pain or lymphadenopathy on the left-hand side.  Vital signs in last 24 hours:    Labs:   Estimated body mass index is 39.32 kg/m as calculated from the following:   Height as of 01/04/17: 5\' 9"  (1.753 m).   Weight as of 01/07/17: 266 lb 4 oz (120.8 kg).   Imaging Review Plain radiographs demonstrate moderate degenerative joint disease of the left knee(s). The overall alignment ismild varus. The bone quality appears to be good  for age and reported activity level.  Assessment/Plan:  End stage arthritis, left knee   The patient history, physical examination, clinical judgment of the provider and imaging studies are consistent with end stage degenerative joint disease of the left knee(s) and total knee arthroplasty is deemed medically necessary. The treatment options including medical management, injection therapy arthroscopy and arthroplasty were discussed at length. The risks and benefits of total knee arthroplasty were presented and reviewed. The risks due to aseptic loosening, infection, stiffness, patella tracking problems, thromboembolic complications and other imponderables were discussed. The patient acknowledged the explanation, agreed to proceed with the plan and consent was signed. Patient is being admitted for inpatient treatment for surgery, pain control, PT, OT, prophylactic antibiotics, VTE prophylaxis, progressive ambulation and ADL's and discharge planning. The patient is planning to be discharged to skilled nursing facility patient understands the risks and benefits of knee replacement on the left-hand  side.  His knee pain is pretty severe and he wants to get some relief.  His quality of life is diminished to the point that he is willing to undergo the risk of the knee replacement in order to achieve a better quality of life.

## 2017-01-15 ENCOUNTER — Inpatient Hospital Stay (HOSPITAL_COMMUNITY): Payer: PPO | Admitting: Anesthesiology

## 2017-01-15 ENCOUNTER — Encounter (HOSPITAL_COMMUNITY): Payer: Self-pay | Admitting: *Deleted

## 2017-01-15 ENCOUNTER — Encounter (HOSPITAL_COMMUNITY)
Admission: RE | Disposition: A | Discharge status: Skilled Nursing Facility | Payer: Self-pay | Source: Ambulatory Visit | Attending: Orthopedic Surgery

## 2017-01-15 ENCOUNTER — Inpatient Hospital Stay (HOSPITAL_COMMUNITY): Payer: PPO | Admitting: Emergency Medicine

## 2017-01-15 ENCOUNTER — Inpatient Hospital Stay (HOSPITAL_COMMUNITY)
Admission: RE | Admit: 2017-01-15 | Discharge: 2017-01-21 | DRG: 470 | Disposition: A | Discharge status: Skilled Nursing Facility | Payer: PPO | Source: Ambulatory Visit | Attending: Orthopedic Surgery | Admitting: Orthopedic Surgery

## 2017-01-15 DIAGNOSIS — Z803 Family history of malignant neoplasm of breast: Secondary | ICD-10-CM

## 2017-01-15 DIAGNOSIS — K219 Gastro-esophageal reflux disease without esophagitis: Secondary | ICD-10-CM | POA: Diagnosis present

## 2017-01-15 DIAGNOSIS — D62 Acute posthemorrhagic anemia: Secondary | ICD-10-CM | POA: Diagnosis not present

## 2017-01-15 DIAGNOSIS — S8990XA Unspecified injury of unspecified lower leg, initial encounter: Secondary | ICD-10-CM | POA: Diagnosis not present

## 2017-01-15 DIAGNOSIS — Z96651 Presence of right artificial knee joint: Secondary | ICD-10-CM | POA: Diagnosis not present

## 2017-01-15 DIAGNOSIS — M25762 Osteophyte, left knee: Secondary | ICD-10-CM | POA: Diagnosis present

## 2017-01-15 DIAGNOSIS — Z8249 Family history of ischemic heart disease and other diseases of the circulatory system: Secondary | ICD-10-CM | POA: Diagnosis not present

## 2017-01-15 DIAGNOSIS — Z89412 Acquired absence of left great toe: Secondary | ICD-10-CM | POA: Diagnosis not present

## 2017-01-15 DIAGNOSIS — Z91040 Latex allergy status: Secondary | ICD-10-CM

## 2017-01-15 DIAGNOSIS — I252 Old myocardial infarction: Secondary | ICD-10-CM

## 2017-01-15 DIAGNOSIS — M1710 Unilateral primary osteoarthritis, unspecified knee: Secondary | ICD-10-CM | POA: Diagnosis present

## 2017-01-15 DIAGNOSIS — M1711 Unilateral primary osteoarthritis, right knee: Secondary | ICD-10-CM | POA: Diagnosis not present

## 2017-01-15 DIAGNOSIS — G8929 Other chronic pain: Secondary | ICD-10-CM | POA: Diagnosis not present

## 2017-01-15 DIAGNOSIS — Z888 Allergy status to other drugs, medicaments and biological substances status: Secondary | ICD-10-CM

## 2017-01-15 DIAGNOSIS — R2689 Other abnormalities of gait and mobility: Secondary | ICD-10-CM | POA: Diagnosis not present

## 2017-01-15 DIAGNOSIS — E669 Obesity, unspecified: Secondary | ICD-10-CM | POA: Diagnosis not present

## 2017-01-15 DIAGNOSIS — I69398 Other sequelae of cerebral infarction: Secondary | ICD-10-CM | POA: Diagnosis not present

## 2017-01-15 DIAGNOSIS — Z885 Allergy status to narcotic agent status: Secondary | ICD-10-CM | POA: Diagnosis not present

## 2017-01-15 DIAGNOSIS — Z886 Allergy status to analgesic agent status: Secondary | ICD-10-CM | POA: Diagnosis not present

## 2017-01-15 DIAGNOSIS — Z8711 Personal history of peptic ulcer disease: Secondary | ICD-10-CM | POA: Diagnosis not present

## 2017-01-15 DIAGNOSIS — M7120 Synovial cyst of popliteal space [Baker], unspecified knee: Secondary | ICD-10-CM | POA: Diagnosis not present

## 2017-01-15 DIAGNOSIS — E039 Hypothyroidism, unspecified: Secondary | ICD-10-CM | POA: Diagnosis present

## 2017-01-15 DIAGNOSIS — Z8614 Personal history of Methicillin resistant Staphylococcus aureus infection: Secondary | ICD-10-CM | POA: Diagnosis not present

## 2017-01-15 DIAGNOSIS — Z6839 Body mass index (BMI) 39.0-39.9, adult: Secondary | ICD-10-CM

## 2017-01-15 DIAGNOSIS — Z4789 Encounter for other orthopedic aftercare: Secondary | ICD-10-CM | POA: Diagnosis not present

## 2017-01-15 DIAGNOSIS — Z89422 Acquired absence of other left toe(s): Secondary | ICD-10-CM | POA: Diagnosis not present

## 2017-01-15 DIAGNOSIS — Z972 Presence of dental prosthetic device (complete) (partial): Secondary | ICD-10-CM

## 2017-01-15 DIAGNOSIS — Z833 Family history of diabetes mellitus: Secondary | ICD-10-CM

## 2017-01-15 DIAGNOSIS — Z9849 Cataract extraction status, unspecified eye: Secondary | ICD-10-CM | POA: Diagnosis not present

## 2017-01-15 DIAGNOSIS — Z91030 Bee allergy status: Secondary | ICD-10-CM

## 2017-01-15 DIAGNOSIS — R569 Unspecified convulsions: Secondary | ICD-10-CM | POA: Diagnosis present

## 2017-01-15 DIAGNOSIS — M170 Bilateral primary osteoarthritis of knee: Secondary | ICD-10-CM | POA: Diagnosis present

## 2017-01-15 DIAGNOSIS — G8918 Other acute postprocedural pain: Secondary | ICD-10-CM | POA: Diagnosis not present

## 2017-01-15 DIAGNOSIS — M1990 Unspecified osteoarthritis, unspecified site: Secondary | ICD-10-CM | POA: Diagnosis not present

## 2017-01-15 DIAGNOSIS — M1712 Unilateral primary osteoarthritis, left knee: Secondary | ICD-10-CM | POA: Diagnosis not present

## 2017-01-15 DIAGNOSIS — Z91018 Allergy to other foods: Secondary | ICD-10-CM

## 2017-01-15 DIAGNOSIS — M6281 Muscle weakness (generalized): Secondary | ICD-10-CM | POA: Diagnosis not present

## 2017-01-15 DIAGNOSIS — I2510 Atherosclerotic heart disease of native coronary artery without angina pectoris: Secondary | ICD-10-CM | POA: Diagnosis present

## 2017-01-15 DIAGNOSIS — G8911 Acute pain due to trauma: Secondary | ICD-10-CM | POA: Diagnosis not present

## 2017-01-15 HISTORY — PX: TOTAL KNEE ARTHROPLASTY: SHX125

## 2017-01-15 SURGERY — ARTHROPLASTY, KNEE, TOTAL
Anesthesia: General | Site: Knee | Laterality: Left

## 2017-01-15 MED ORDER — VANCOMYCIN HCL IN DEXTROSE 1-5 GM/200ML-% IV SOLN
1000.0000 mg | Freq: Once | INTRAVENOUS | Status: DC
Start: 2017-01-15 — End: 2017-01-15

## 2017-01-15 MED ORDER — ONDANSETRON HCL 4 MG PO TABS: 4.0000 mg | ORAL_TABLET | Freq: Four times a day (QID) | ORAL | Status: DC | PRN

## 2017-01-15 MED ORDER — ROCURONIUM BROMIDE 50 MG/5ML IV SOSY
PREFILLED_SYRINGE | INTRAVENOUS | Status: AC
  Filled 2017-01-15: qty 5

## 2017-01-15 MED ORDER — FENTANYL CITRATE (PF) 100 MCG/2ML IJ SOLN
INTRAMUSCULAR | Status: DC | PRN
  Administered 2017-01-15 (×2): 50 ug via INTRAVENOUS
  Administered 2017-01-15: 100 ug via INTRAVENOUS
  Administered 2017-01-15: 50 ug via INTRAVENOUS
  Administered 2017-01-15: 150 ug via INTRAVENOUS

## 2017-01-15 MED ORDER — PHENOL 1.4 % MT LIQD: 1.0000 | OROMUCOSAL | Status: DC | PRN

## 2017-01-15 MED ORDER — SODIUM CHLORIDE 0.9 % IR SOLN
Status: DC | PRN
  Administered 2017-01-15: 3000 mL

## 2017-01-15 MED ORDER — PROPOFOL 10 MG/ML IV BOLUS
INTRAVENOUS | Status: DC | PRN
  Administered 2017-01-15: 50 mg via INTRAVENOUS
  Administered 2017-01-15: 150 mg via INTRAVENOUS

## 2017-01-15 MED ORDER — TRANEXAMIC ACID 1000 MG/10ML IV SOLN
1000.0000 mg | INTRAVENOUS | Status: AC
  Administered 2017-01-15: 1000 mg via INTRAVENOUS
  Filled 2017-01-15: qty 10

## 2017-01-15 MED ORDER — CLONIDINE HCL (ANALGESIA) 100 MCG/ML EP SOLN
150.0000 ug | Freq: Once | EPIDURAL | Status: AC
  Administered 2017-01-15: 100 ug via INTRA_ARTICULAR
  Filled 2017-01-15: qty 1.5

## 2017-01-15 MED ORDER — VANCOMYCIN HCL 10 G IV SOLR
1500.0000 mg | INTRAVENOUS | Status: AC
  Administered 2017-01-15: 1.5 g via INTRAVENOUS
  Filled 2017-01-15: qty 1500

## 2017-01-15 MED ORDER — VANCOMYCIN HCL IN DEXTROSE 1-5 GM/200ML-% IV SOLN
1000.0000 mg | Freq: Two times a day (BID) | INTRAVENOUS | Status: AC
  Administered 2017-01-15: 1000 mg via INTRAVENOUS
  Filled 2017-01-15: qty 200

## 2017-01-15 MED ORDER — METHOCARBAMOL 1000 MG/10ML IJ SOLN
500.0000 mg | Freq: Four times a day (QID) | INTRAVENOUS | Status: DC | PRN
  Filled 2017-01-15: qty 5

## 2017-01-15 MED ORDER — ONDANSETRON HCL 4 MG/2ML IJ SOLN: 4.0000 mg | Freq: Four times a day (QID) | INTRAMUSCULAR | Status: DC | PRN

## 2017-01-15 MED ORDER — PANTOPRAZOLE SODIUM 40 MG PO TBEC
40.0000 mg | DELAYED_RELEASE_TABLET | Freq: Every day | ORAL | Status: DC
  Administered 2017-01-16 – 2017-01-21 (×6): 40 mg via ORAL
  Filled 2017-01-15 (×6): qty 1

## 2017-01-15 MED ORDER — CHLORHEXIDINE GLUCONATE 4 % EX LIQD: 60.0000 mL | Freq: Once | CUTANEOUS | Status: DC

## 2017-01-15 MED ORDER — EPINEPHRINE 0.3 MG/0.3ML IJ SOAJ: 0.3000 mg | INTRAMUSCULAR | Status: DC

## 2017-01-15 MED ORDER — TRANEXAMIC ACID 1000 MG/10ML IV SOLN
2000.0000 mg | INTRAVENOUS | Status: AC
  Administered 2017-01-15: 2000 mg via TOPICAL
  Filled 2017-01-15: qty 20

## 2017-01-15 MED ORDER — LACTATED RINGERS IV SOLN
INTRAVENOUS | Status: DC
  Administered 2017-01-15 (×2): via INTRAVENOUS

## 2017-01-15 MED ORDER — ACETAMINOPHEN 650 MG RE SUPP: 650.0000 mg | Freq: Four times a day (QID) | RECTAL | Status: DC | PRN

## 2017-01-15 MED ORDER — SODIUM CHLORIDE 0.9% FLUSH
INTRAVENOUS | Status: DC | PRN
  Administered 2017-01-15 (×2): 10 mL

## 2017-01-15 MED ORDER — PHENYLEPHRINE HCL 10 MG/ML IJ SOLN
INTRAMUSCULAR | Status: DC | PRN
  Administered 2017-01-15 (×5): 80 ug via INTRAVENOUS
  Administered 2017-01-15: 40 ug via INTRAVENOUS
  Administered 2017-01-15 (×6): 80 ug via INTRAVENOUS

## 2017-01-15 MED ORDER — RIVAROXABAN 10 MG PO TABS
10.0000 mg | ORAL_TABLET | Freq: Every day | ORAL | Status: DC
  Administered 2017-01-16 – 2017-01-21 (×6): 10 mg via ORAL
  Filled 2017-01-15 (×6): qty 1

## 2017-01-15 MED ORDER — BUPIVACAINE-EPINEPHRINE (PF) 0.5% -1:200000 IJ SOLN
INTRAMUSCULAR | Status: DC | PRN
  Administered 2017-01-15: 20 mL via PERINEURAL

## 2017-01-15 MED ORDER — AMITRIPTYLINE HCL 50 MG PO TABS
75.0000 mg | ORAL_TABLET | Freq: Every day | ORAL | Status: DC
  Administered 2017-01-15 – 2017-01-21 (×7): 75 mg via ORAL
  Filled 2017-01-15 (×7): qty 1

## 2017-01-15 MED ORDER — TRANEXAMIC ACID 1000 MG/10ML IV SOLN: 1000.0000 mg | INTRAVENOUS | Status: DC

## 2017-01-15 MED ORDER — LEVOTHYROXINE SODIUM 50 MCG PO TABS
50.0000 ug | ORAL_TABLET | Freq: Every day | ORAL | Status: DC
  Administered 2017-01-16 – 2017-01-21 (×6): 50 ug via ORAL
  Filled 2017-01-15 (×6): qty 1

## 2017-01-15 MED ORDER — METOCLOPRAMIDE HCL 5 MG PO TABS: 5.0000 mg | ORAL_TABLET | Freq: Three times a day (TID) | ORAL | Status: DC | PRN

## 2017-01-15 MED ORDER — MIDAZOLAM HCL 2 MG/2ML IJ SOLN
INTRAMUSCULAR | Status: DC | PRN
  Administered 2017-01-15 (×2): 1 mg via INTRAVENOUS

## 2017-01-15 MED ORDER — MIDAZOLAM HCL 2 MG/2ML IJ SOLN
INTRAMUSCULAR | Status: AC
  Filled 2017-01-15: qty 2

## 2017-01-15 MED ORDER — LIDOCAINE HCL (CARDIAC) 20 MG/ML IV SOLN
INTRAVENOUS | Status: DC | PRN
Start: 2017-01-15 — End: 2017-01-15
  Administered 2017-01-15: 100 mg via INTRAVENOUS

## 2017-01-15 MED ORDER — VANCOMYCIN HCL 500 MG IV SOLR
INTRAVENOUS | Status: AC
  Filled 2017-01-15: qty 500

## 2017-01-15 MED ORDER — HYDROMORPHONE HCL 1 MG/ML IJ SOLN
0.5000 mg | INTRAMUSCULAR | Status: DC | PRN
  Administered 2017-01-15 – 2017-01-17 (×2): 0.5 mg via INTRAVENOUS
  Filled 2017-01-15 (×3): qty 1

## 2017-01-15 MED ORDER — METHOCARBAMOL 500 MG PO TABS
500.0000 mg | ORAL_TABLET | Freq: Four times a day (QID) | ORAL | Status: DC | PRN
  Administered 2017-01-15 – 2017-01-18 (×8): 500 mg via ORAL
  Filled 2017-01-15 (×9): qty 1

## 2017-01-15 MED ORDER — METOCLOPRAMIDE HCL 5 MG/ML IJ SOLN: 5.0000 mg | Freq: Three times a day (TID) | INTRAMUSCULAR | Status: DC | PRN

## 2017-01-15 MED ORDER — BUPIVACAINE HCL (PF) 0.25 % IJ SOLN
INTRAMUSCULAR | Status: DC | PRN
  Administered 2017-01-15 (×2): 20 mL

## 2017-01-15 MED ORDER — ALBUMIN HUMAN 5 % IV SOLN
INTRAVENOUS | Status: DC | PRN
  Administered 2017-01-15: 10:00:00 via INTRAVENOUS

## 2017-01-15 MED ORDER — BUPIVACAINE HCL (PF) 0.25 % IJ SOLN
INTRAMUSCULAR | Status: AC
  Filled 2017-01-15: qty 30

## 2017-01-15 MED ORDER — EPINEPHRINE PF 1 MG/ML IJ SOLN
INTRAMUSCULAR | Status: AC
  Filled 2017-01-15: qty 1

## 2017-01-15 MED ORDER — MORPHINE SULFATE (PF) 4 MG/ML IV SOLN
INTRAVENOUS | Status: AC
  Filled 2017-01-15: qty 2

## 2017-01-15 MED ORDER — BUPIVACAINE LIPOSOME 1.3 % IJ SUSP
20.0000 mL | INTRAMUSCULAR | Status: AC
  Administered 2017-01-15: 20 mL
  Filled 2017-01-15: qty 20

## 2017-01-15 MED ORDER — FENTANYL CITRATE (PF) 250 MCG/5ML IJ SOLN
INTRAMUSCULAR | Status: AC
  Filled 2017-01-15: qty 5

## 2017-01-15 MED ORDER — ACETAMINOPHEN 325 MG PO TABS: 650.0000 mg | ORAL_TABLET | Freq: Four times a day (QID) | ORAL | Status: DC | PRN

## 2017-01-15 MED ORDER — GABAPENTIN 300 MG PO CAPS
300.0000 mg | ORAL_CAPSULE | Freq: Three times a day (TID) | ORAL | Status: DC
  Administered 2017-01-15 – 2017-01-21 (×20): 300 mg via ORAL
  Filled 2017-01-15 (×20): qty 1

## 2017-01-15 MED ORDER — HYDROCODONE-ACETAMINOPHEN 10-325 MG PO TABS
1.0000 | ORAL_TABLET | ORAL | Status: DC | PRN
  Administered 2017-01-15 (×2): 1 via ORAL
  Administered 2017-01-16 – 2017-01-17 (×9): 2 via ORAL
  Administered 2017-01-18: 1 via ORAL
  Administered 2017-01-18: 2 via ORAL
  Administered 2017-01-18: 1 via ORAL
  Administered 2017-01-18 – 2017-01-21 (×10): 2 via ORAL
  Filled 2017-01-15 (×4): qty 2
  Filled 2017-01-15: qty 1
  Filled 2017-01-15 (×3): qty 2
  Filled 2017-01-15: qty 1
  Filled 2017-01-15 (×6): qty 2
  Filled 2017-01-15: qty 1
  Filled 2017-01-15 (×2): qty 2
  Filled 2017-01-15: qty 1
  Filled 2017-01-15 (×8): qty 2

## 2017-01-15 MED ORDER — MENTHOL 3 MG MT LOZG
1.0000 | LOZENGE | OROMUCOSAL | Status: DC | PRN
  Administered 2017-01-20: 3 mg via ORAL
  Filled 2017-01-15: qty 9

## 2017-01-15 MED ORDER — CEFAZOLIN SODIUM 10 G IJ SOLR
3.0000 g | INTRAMUSCULAR | Status: DC
  Filled 2017-01-15: qty 3000

## 2017-01-15 MED ORDER — 0.9 % SODIUM CHLORIDE (POUR BTL) OPTIME
TOPICAL | Status: DC | PRN
  Administered 2017-01-15 (×3): 1000 mL

## 2017-01-15 MED ORDER — VANCOMYCIN HCL 500 MG IV SOLR
INTRAVENOUS | Status: DC | PRN
  Administered 2017-01-15: 500 mg

## 2017-01-15 MED ORDER — ONDANSETRON HCL 4 MG/2ML IJ SOLN
INTRAMUSCULAR | Status: DC | PRN
  Administered 2017-01-15: 4 mg via INTRAVENOUS

## 2017-01-15 MED ORDER — PROPOFOL 10 MG/ML IV BOLUS
INTRAVENOUS | Status: AC
  Filled 2017-01-15: qty 20

## 2017-01-15 MED ORDER — ROCURONIUM BROMIDE 100 MG/10ML IV SOLN
INTRAVENOUS | Status: DC | PRN
  Administered 2017-01-15: 50 mg via INTRAVENOUS

## 2017-01-15 MED ORDER — FERROUS GLUCONATE 324 (38 FE) MG PO TABS
324.0000 mg | ORAL_TABLET | Freq: Every day | ORAL | Status: DC
  Administered 2017-01-15 – 2017-01-21 (×7): 324 mg via ORAL
  Filled 2017-01-15 (×7): qty 1

## 2017-01-15 MED ORDER — LIDOCAINE 2% (20 MG/ML) 5 ML SYRINGE
INTRAMUSCULAR | Status: AC
  Filled 2017-01-15: qty 10

## 2017-01-15 MED ORDER — SUGAMMADEX SODIUM 200 MG/2ML IV SOLN
INTRAVENOUS | Status: DC | PRN
  Administered 2017-01-15: 350 mg via INTRAVENOUS

## 2017-01-15 MED ORDER — BUPIVACAINE HCL (PF) 0.5 % IJ SOLN
INTRAMUSCULAR | Status: AC
  Filled 2017-01-15: qty 30

## 2017-01-15 MED ORDER — POTASSIUM CHLORIDE IN NACL 20-0.9 MEQ/L-% IV SOLN
INTRAVENOUS | Status: AC
  Administered 2017-01-15: 13:00:00 via INTRAVENOUS
  Filled 2017-01-15: qty 1000

## 2017-01-15 MED ORDER — ADULT MULTIVITAMIN W/MINERALS CH
1.0000 | ORAL_TABLET | Freq: Every day | ORAL | Status: DC
  Administered 2017-01-15 – 2017-01-21 (×7): 1 via ORAL
  Filled 2017-01-15 (×7): qty 1

## 2017-01-15 SURGICAL SUPPLY — 79 items
BANDAGE ACE 4X5 VEL STRL LF (GAUZE/BANDAGES/DRESSINGS) ×3 IMPLANT
BANDAGE ELASTIC 6 VELCRO ST LF (GAUZE/BANDAGES/DRESSINGS) ×2 IMPLANT
BANDAGE ESMARK 6X9 LF (GAUZE/BANDAGES/DRESSINGS) ×1 IMPLANT
BLADE SAG 18X100X1.27 (BLADE) ×3 IMPLANT
BLADE SAW SGTL 13.0X1.19X90.0M (BLADE) ×2 IMPLANT
BLADE SURG 10 STRL SS (BLADE) ×2 IMPLANT
BNDG CMPR 9X6 STRL LF SNTH (GAUZE/BANDAGES/DRESSINGS) ×1
BNDG CMPR MED 15X6 ELC VLCR LF (GAUZE/BANDAGES/DRESSINGS) ×3
BNDG COHESIVE 6X5 TAN STRL LF (GAUZE/BANDAGES/DRESSINGS) ×3 IMPLANT
BNDG ELASTIC 6X15 VLCR STRL LF (GAUZE/BANDAGES/DRESSINGS) ×9 IMPLANT
BNDG ESMARK 6X9 LF (GAUZE/BANDAGES/DRESSINGS) ×3
BOWL SMART MIX CTS (DISPOSABLE) IMPLANT
CAPT KNEE TRIATH TK-4 ×2 IMPLANT
CLOSURE WOUND 1/2 X4 (GAUZE/BANDAGES/DRESSINGS) ×2
COVER SURGICAL LIGHT HANDLE (MISCELLANEOUS) ×3 IMPLANT
CUFF TOURNIQUET SINGLE 34IN LL (TOURNIQUET CUFF) ×3 IMPLANT
DECANTER SPIKE VIAL GLASS SM (MISCELLANEOUS) ×3 IMPLANT
DRAPE INCISE IOBAN 66X45 STRL (DRAPES) ×2 IMPLANT
DRAPE ORTHO SPLIT 77X108 STRL (DRAPES) ×9
DRAPE SURG ORHT 6 SPLT 77X108 (DRAPES) ×3 IMPLANT
DRAPE U-SHAPE 47X51 STRL (DRAPES) ×3 IMPLANT
DRSG AQUACEL AG ADV 3.5X10 (GAUZE/BANDAGES/DRESSINGS) ×3 IMPLANT
DRSG AQUACEL AG ADV 3.5X14 (GAUZE/BANDAGES/DRESSINGS) IMPLANT
DRSG PAD ABDOMINAL 8X10 ST (GAUZE/BANDAGES/DRESSINGS) ×6 IMPLANT
DURAPREP 26ML APPLICATOR (WOUND CARE) ×6 IMPLANT
ELECT CAUTERY BLADE 6.4 (BLADE) ×3 IMPLANT
ELECT REM PT RETURN 9FT ADLT (ELECTROSURGICAL) ×3
ELECTRODE REM PT RTRN 9FT ADLT (ELECTROSURGICAL) ×1 IMPLANT
GAUZE SPONGE 4X4 12PLY STRL (GAUZE/BANDAGES/DRESSINGS) ×3 IMPLANT
GAUZE XEROFORM 1X8 LF (GAUZE/BANDAGES/DRESSINGS) ×2 IMPLANT
GLOVE BIO SURGEON STRL SZ7 (GLOVE) ×2 IMPLANT
GLOVE BIOGEL PI IND STRL 6.5 (GLOVE) IMPLANT
GLOVE BIOGEL PI IND STRL 7.5 (GLOVE) ×1 IMPLANT
GLOVE BIOGEL PI IND STRL 8 (GLOVE) ×1 IMPLANT
GLOVE BIOGEL PI INDICATOR 6.5 (GLOVE) ×4
GLOVE BIOGEL PI INDICATOR 7.5 (GLOVE) ×4
GLOVE BIOGEL PI INDICATOR 8 (GLOVE) ×2
GLOVE ECLIPSE 7.0 STRL STRAW (GLOVE) ×3 IMPLANT
GLOVE SURG ORTHO 8.0 STRL STRW (GLOVE) ×3 IMPLANT
GLOVE SURG SS PI 6.5 STRL IVOR (GLOVE) ×6 IMPLANT
GOWN STRL REUS W/ TWL LRG LVL3 (GOWN DISPOSABLE) ×3 IMPLANT
GOWN STRL REUS W/TWL LRG LVL3 (GOWN DISPOSABLE) ×9
HANDPIECE INTERPULSE COAX TIP (DISPOSABLE) ×3
HOOD PEEL AWAY FLYTE STAYCOOL (MISCELLANEOUS) ×9 IMPLANT
IMMOBILIZER KNEE 20 (SOFTGOODS) IMPLANT
IMMOBILIZER KNEE 22 (SOFTGOODS) ×2 IMPLANT
IMMOBILIZER KNEE 22 UNIV (SOFTGOODS) ×2 IMPLANT
KIT BASIN OR (CUSTOM PROCEDURE TRAY) ×3 IMPLANT
KIT ROOM TURNOVER OR (KITS) ×3 IMPLANT
MANIFOLD NEPTUNE II (INSTRUMENTS) ×3 IMPLANT
NDL SAFETY ECLIPSE 18X1.5 (NEEDLE) ×1 IMPLANT
NDL SPNL 18GX3.5 QUINCKE PK (NEEDLE) ×1 IMPLANT
NEEDLE HYPO 18GX1.5 SHARP (NEEDLE) ×3
NEEDLE HYPO 22GX1.5 SAFETY (NEEDLE) ×3 IMPLANT
NEEDLE SPNL 18GX3.5 QUINCKE PK (NEEDLE) ×3 IMPLANT
NS IRRIG 1000ML POUR BTL (IV SOLUTION) ×8 IMPLANT
PACK TOTAL JOINT (CUSTOM PROCEDURE TRAY) ×3 IMPLANT
PAD ARMBOARD 7.5X6 YLW CONV (MISCELLANEOUS) ×6 IMPLANT
PAD CAST 4YDX4 CTTN HI CHSV (CAST SUPPLIES) ×1 IMPLANT
PADDING CAST COTTON 4X4 STRL (CAST SUPPLIES) ×3
PADDING CAST COTTON 6X4 STRL (CAST SUPPLIES) ×3 IMPLANT
SET HNDPC FAN SPRY TIP SCT (DISPOSABLE) IMPLANT
SPONGE LAP 18X18 X RAY DECT (DISPOSABLE) IMPLANT
STRIP CLOSURE SKIN 1/2X4 (GAUZE/BANDAGES/DRESSINGS) ×4 IMPLANT
SUCTION FRAZIER HANDLE 10FR (MISCELLANEOUS) ×2
SUCTION TUBE FRAZIER 10FR DISP (MISCELLANEOUS) ×1 IMPLANT
SUT MNCRL AB 3-0 PS2 18 (SUTURE) ×3 IMPLANT
SUT VIC AB 0 CT1 27 (SUTURE) ×9
SUT VIC AB 0 CT1 27XBRD ANBCTR (SUTURE) ×3 IMPLANT
SUT VIC AB 1 CT1 27 (SUTURE) ×15
SUT VIC AB 1 CT1 27XBRD ANBCTR (SUTURE) ×5 IMPLANT
SUT VIC AB 2-0 CT1 27 (SUTURE) ×12
SUT VIC AB 2-0 CT1 TAPERPNT 27 (SUTURE) ×4 IMPLANT
SYR 30ML LL (SYRINGE) ×9 IMPLANT
SYR TB 1ML LUER SLIP (SYRINGE) ×3 IMPLANT
TOWEL OR 17X24 6PK STRL BLUE (TOWEL DISPOSABLE) ×6 IMPLANT
TOWEL OR 17X26 10 PK STRL BLUE (TOWEL DISPOSABLE) ×6 IMPLANT
TRAY CATH 16FR W/PLASTIC CATH (SET/KITS/TRAYS/PACK) IMPLANT
WATER STERILE IRR 1000ML POUR (IV SOLUTION) IMPLANT

## 2017-01-15 NOTE — Evaluation (Signed)
Physical Therapy Evaluation Patient Details Name: Nicholas [name] MRN: [id number] DOB: [birth year] Today's Date: 01/15/2017   History of Present Illness  pt is a 66 y/o male with h/o DDD OA, past R TKA with complication admitted with arthritic L knee which has failed conservative management, s/p LTKA.  Clinical Impression  Pt admitted with/for L TKA. Given pt's fused R knee, pt having difficulty with sit to stand without elevated seating surfaces with armrests.  Otherwise, pt is at a min guard level.  Pt currently limited functionally due to the problems listed below.  (see problems list.)  Pt will benefit from PT to maximize function and safety to be able to get home safely with available assist .     Follow Up Recommendations Home health PT;Supervision - Intermittent    Equipment Recommendations  3in1 (PT)    Recommendations for Other Services       Precautions / Restrictions Precautions Precautions: Fall      Mobility  Bed Mobility Overal bed mobility: Needs Assistance Bed Mobility: Supine to Sit     Supine to sit: Min guard     General bed mobility comments: with both knee in full extension, bridging was more effortful.  minor assist to EOB  Transfers Overall transfer level: Needs assistance Equipment used: Rolling walker (2 wheeled) Transfers: Sit to/from Stand Sit to Stand: From elevated surface;Mod assist;+2 physical assistance (from bed and min guard from elevated recliner with armrests)         General transfer comment: took immobilizer off L LE due to pt having difficulty sit to stand with both knee in extension.  Ambulation/Gait Ambulation/Gait assistance: Min guard Ambulation Distance (Feet): 190 Feet Assistive device: Rolling walker (2 wheeled) Gait Pattern/deviations: Step-to pattern Gait velocity: slower Gait velocity interpretation: Below normal speed for age/gender General Gait Details: step to with progression toward step through with stable Left  knee without immobilizer.  SpO2 on RA 95% and HR 110's  Stairs            Wheelchair Mobility    Modified Rankin (Stroke Patients Only)       Balance                                             Pertinent Vitals/Pain Pain Assessment: 0-10 Pain Score: 8  Pain Location: knee Pain Descriptors / Indicators: Sore;Guarding;Grimacing Pain Intervention(s): Monitored during session;Premedicated before session    Home Living Family/patient expects to be discharged to:: Private residence Living Arrangements: Alone Available Help at Discharge: Available PRN/intermittently (sister taking care of his Mom, which takes all her time) Type of Home: House Home Access: Stairs to enter     Home Layout: Two level Home Equipment: Environmental consultant - 2 wheels;Cane - single point;Wheelchair - manual Additional Comments: toilet space is very narrow, will not accept a BSC    Prior Function Level of Independence: Independent               Hand Dominance        Extremity/Trunk Assessment        Lower Extremity Assessment Lower Extremity Assessment: RLE deficits/detail;LLE deficits/detail RLE Deficits / Details: knee fused in full extension RLE Coordination: decreased fine motor LLE Deficits / Details: mildly weak with pain, aa knee flexion to 75*, -5* ext LLE Coordination: decreased fine motor       Communication   Communication:  No difficulties  Cognition Arousal/Alertness: Awake/alert Behavior During Therapy: WFL for tasks assessed/performed Overall Cognitive Status: Within Functional Limits for tasks assessed                                        General Comments      Exercises Total Joint Exercises Ankle Circles/Pumps: AROM;Both;20 reps;Supine Quad Sets: AROM;Both;10 reps;Supine Heel Slides: AAROM;AROM;Left;10 reps;Supine Knee Flexion: AROM;5 reps;Seated Goniometric ROM: 75*   Assessment/Plan    PT Assessment Patient needs  continued PT services  PT Problem List Decreased strength;Decreased activity tolerance;Decreased range of motion;Decreased mobility;Decreased knowledge of use of DME;Pain       PT Treatment Interventions DME instruction;Gait training;Stair training;Functional mobility training;Therapeutic activities;Patient/family education    PT Goals (Current goals can be found in the Care Plan section)  Acute Rehab PT Goals Patient Stated Goal: Home independently PT Goal Formulation: With patient Time For Goal Achievement: 2017-01-18 Potential to Achieve Goals: Good    Frequency 7X/week   Barriers to discharge Decreased caregiver support      Co-evaluation               End of Session   Activity Tolerance: Patient tolerated treatment well Patient left: in chair;with call bell/phone within reach Nurse Communication: Mobility status PT Visit Diagnosis: Pain;Difficulty in walking, not elsewhere classified (R26.2) Pain - Right/Left: Left Pain - part of body: Knee    Time: 4098-1191 PT Time Calculation (min) (ACUTE ONLY): 35 min   Charges:   PT Evaluation $PT Eval Moderate Complexity: 1 Procedure PT Treatments $Gait Training: 8-22 mins   PT G Codes:        January 18, 2017  Soledad Bing, PT [phone]  (pager)   Eliseo Gum Caison Hearn 2017-01-18, 2:44 PM 01/18/2017  Gooding Bing, PT [phone]  (pager)

## 2017-01-15 NOTE — Op Note (Deleted)
  The note originally documented on this encounter has been moved the the encounter in which it belongs.  

## 2017-01-15 NOTE — Anesthesia Procedure Notes (Signed)
Procedure Name: Intubation Date/Time: 01/15/2017 7:52 AM Performed by: Ollen Bowl Pre-anesthesia Checklist: Patient identified, Emergency Drugs available, Suction available, Patient being monitored and Timeout performed Patient Re-evaluated:Patient Re-evaluated prior to inductionOxygen Delivery Method: Circle system utilized and Simple face mask Preoxygenation: Pre-oxygenation with 100% oxygen Intubation Type: IV induction Ventilation: Mask ventilation with difficulty and Oral airway inserted - appropriate to patient size Laryngoscope Size: Sabra Heck and 3 Grade View: Grade I Tube type: Oral Tube size: 7.5 mm Number of attempts: 1 Airway Equipment and Method: Patient positioned with wedge pillow and Stylet Placement Confirmation: ETT inserted through vocal cords under direct vision,  positive ETCO2 and breath sounds checked- equal and bilateral Secured at: 22 cm Tube secured with: Tape Dental Injury: Teeth and Oropharynx as per pre-operative assessment

## 2017-01-15 NOTE — Op Note (Signed)
NAMEMarland Kitchen  [name], Nicholas NO.:  [name]  MEDICAL RECORD NO.:  [id number]  LOCATION:                               FACILITY:  [facility]  PHYSICIAN:  Anderson Malta, M.D.    DATE OF BIRTH:  10/28/1950  DATE OF PROCEDURE: DATE OF DISCHARGE:                              OPERATIVE REPORT   PREOPERATIVE DIAGNOSIS:  Left knee arthritis.  POSTOPERATIVE DIAGNOSIS:  Left knee arthritis.  PROCEDURE:  Left total knee replacement using Stryker components 38 mm press-fit patella, 3-peg with size 7 cruciate retaining femur, size 7 tibia with 11 mm polyethylene insert.  SURGEON:  Anderson Malta, M.D.  ASSISTANT:  April Green, RNFA.  INDICATIONS:  Nicholas [name] is a 66 year old patient with left knee arthritis refractory to nonoperative management, who presents for operative management after explanation of risks and benefits.  PROCEDURE IN DETAIL:  The patient was brought to the operating room where general endotracheal anesthesia was induced.  Preoperative IV antibiotics were administered.  Tranexamic acid was administered.  Time- out was called.  Left leg was prescrubbed with alcohol and Betadine, allowed to air dry, prepped with DuraPrep solution, and draped in sterile manner.  Nicholas [name] was used to cover the operative field.  Leg was elevated and exsanguinated with the Esmarch wrap.  Tourniquet time 81 minutes at 300 mmHg.  Anterior approach to knee was made.  Skin and subcutaneous tissue were sharply divided.  Median parapatellar approach was made, marked with a #1 Vicryl suture.  Fat pad was partially excised.  Soft tissue from the anterior distal femur was excised.  The lateral patellofemoral ligament was released.  Minimal soft tissue dissection medially was performed but enough in order to place retractors.  The patella was everted.  Osteophytes were removed.  The tibial cut was then made and an 11 mm resection off the least affected lateral tibial plateau using  intramedullary alignment.  This was done in order to achieve nonsclerotic bony surface for the press-fit tibia.  In a similar manner, a 10 mm cut was made off the distal femur 5 degrees of valgus.  The anterior, posterior, and chamber cuts were made.  Tibia was keel punched.  Patella was trimmed from a 28 down to a 16 and a 38 trial patella was placed.  With trial components in position, the patient achieved full extension, full flexion, excellent patellar tracking using no thumbs technique.  Trial components were removed.  Tibia was keel punched and prepared for the press-fit.  A small cyst in the posterior aspect of the tibia was bone grafted.  Bone plug placed in the femur. Thorough irrigation with 3 liters of irrigating solution was performed. Exparel and Marcaine saline solution then injected into the capsule. Tranexamic acid was then allowed to sit for 3 minutes within the knee. This was removed and then the true components were placed.  Thorough irrigation again performed.  Tourniquet was released.  Bleeding points encountered and controlled using electrocautery.  Thorough irrigation was again performed.  Vancomycin powder was placed into the knee.  The incision was then closed using #1 Vicryl suture, 0 Vicryl suture, 2-0 Vicryl suture, and 3-0 Monocryl. Steri-Strips and  Aquacel dressing applied.  Ace wrap and knee immobilizer applied.  The patient tolerated the procedure well without immediate complication.  Transferred to the recovery room in stable condition.     Anderson Malta, M.D.   ______________________________ Nicholas [name], M.D.    GSD/MEDQ  D:  01/15/2017  T:  01/15/2017  Job:  [id number]

## 2017-01-15 NOTE — Interval H&P Note (Signed)
History and Physical Interval Note:  01/15/2017 7:32 AM  Nicholas [name]  has presented today for surgery, with the diagnosis of LEFT KNEE OSTEOARTHRITIS  The various methods of treatment have been discussed with the patient and family. After consideration of risks, benefits and other options for treatment, the patient has consented to  Procedure(s): TOTAL KNEE ARTHROPLASTY (Left) as a surgical intervention .  The patient's history has been reviewed, patient examined, no change in status, stable for surgery.  I have reviewed the patient's chart and labs.  Questions were answered to the patient's satisfaction.     Anderson Malta

## 2017-01-15 NOTE — Brief Op Note (Signed)
01/15/2017  10:29 AM  PATIENT:  [name]  66 y.o. male  PRE-OPERATIVE DIAGNOSIS:  LEFT KNEE OSTEOARTHRITIS  POST-OPERATIVE DIAGNOSIS:  LEFT KNEE OSTEOARTHRITIS  PROCEDURE:  Procedure(s): TOTAL KNEE ARTHROPLASTY  SURGEON:  Surgeon(s): Meredith Pel, MD  ASSISTANT: April Green rnfa  ANESTHESIA:   general  EBL: 250 ml    Total I/O In: 1250 [I.V.:1000; IV Piggyback:250] Out: 300 [Blood:300]  BLOOD ADMINISTERED: none  DRAINS: none   LOCAL MEDICATIONS USED:  Marcaine clonidine exparel  SPECIMEN:  No Specimen  COUNTS:  YES  TOURNIQUET:   Total Tourniquet Time Documented: Thigh (Left) - 81 minutes Total: Thigh (Left) - 81 minutes   DICTATION: .Other Dictation: Dictation Number [id number]  PLAN OF CARE: Admit to inpatient   PATIENT DISPOSITION:  PACU - hemodynamically stable

## 2017-01-15 NOTE — Anesthesia Preprocedure Evaluation (Addendum)
Anesthesia Evaluation  Patient identified by MRN, date of birth, ID band Patient awake    Reviewed: Allergy & Precautions, NPO status , Patient's Chart, lab work & pertinent test results  Airway Mallampati: II  TM Distance: >3 FB Neck ROM: Full    Dental  (+) Dental Advisory Given, Lower Dentures, Upper Dentures   Pulmonary asthma ,    Pulmonary exam normal breath sounds clear to auscultation       Cardiovascular + CAD, + Past MI and +CHF  Normal cardiovascular exam Rhythm:Regular Rate:Normal     Neuro/Psych  Headaches, Seizures -, Well Controlled,  PSYCHIATRIC DISORDERS Anxiety Depression CVA, Residual Symptoms    GI/Hepatic Neg liver ROS, PUD, GERD  Medicated and Controlled,  Endo/Other  Hypothyroidism Obesity   Renal/GU negative Renal ROS     Musculoskeletal  (+) Arthritis , Osteoarthritis,    Abdominal   Peds  Hematology  (+) Blood dyscrasia, anemia , Plt 178k   Anesthesia Other Findings Day of surgery medications reviewed with the patient.  Reproductive/Obstetrics                            Anesthesia Physical Anesthesia Plan  ASA: III  Anesthesia Plan: General   Post-op Pain Management:  Regional for Post-op pain   Induction: Intravenous  Airway Management Planned: Oral ETT  Additional Equipment:   Intra-op Plan:   Post-operative Plan: Extubation in OR  Informed Consent: I have reviewed the patients History and Physical, chart, labs and discussed the procedure including the risks, benefits and alternatives for the proposed anesthesia with the patient or authorized representative who has indicated his/her understanding and acceptance.   Dental advisory given  Plan Discussed with: CRNA, Anesthesiologist and Surgeon  Anesthesia Plan Comments: (Risks/benefits of general anesthesia discussed with patient including risk of damage to teeth, lips, gum, and tongue,  nausea/vomiting, allergic reactions to medications, and the possibility of heart attack, stroke and death.  All patient questions answered.  Patient wishes to proceed.)       Anesthesia Quick Evaluation

## 2017-01-15 NOTE — Transfer of Care (Signed)
Immediate Anesthesia Transfer of Care Note  Patient: Nicholas [name]  Procedure(s) Performed: Procedure(s): TOTAL KNEE ARTHROPLASTY (Left)  Patient Location: PACU  Anesthesia Type:GA combined with regional for post-op pain  Level of Consciousness: awake and alert   Airway & Oxygen Therapy: Patient Spontanous Breathing and Patient connected to nasal cannula oxygen  Post-op Assessment: Report given to RN and Post -op Vital signs reviewed and stable  Post vital signs: Reviewed and stable  Last Vitals:  Vitals:   01/15/17 0552 01/15/17 1035  BP: (!) 179/95   Pulse: 92   Resp: 20   Temp: 36.9 C (P) 36.9 C    Last Pain:  Vitals:   01/15/17 0558  PainSc: 6       Patients Stated Pain Goal: 4 ([birth year] 8841)  Complications: No apparent anesthesia complications

## 2017-01-15 NOTE — Anesthesia Procedure Notes (Signed)
Anesthesia Regional Block: Adductor canal block   Pre-Anesthetic Checklist: ,, timeout performed, Correct Patient, Correct Site, Correct Laterality, Correct Procedure, Correct Position, site marked, Risks and benefits discussed,  Surgical consent,  Pre-op evaluation,  At surgeon's request and post-op pain management  Laterality: Left  Prep: chloraprep       Needles:  Injection technique: Single-shot  Needle Type: Echogenic Needle     Needle Length: 9cm  Needle Gauge: 21     Additional Needles:   Procedures: ultrasound guided,,,,,,,,  Narrative:  Start time: 01/15/2017 7:00 AM End time: 01/15/2017 7:05 AM Injection made incrementally with aspirations every 5 mL.  Performed by: Personally  Anesthesiologist: Catalina Gravel  Additional Notes: No pain on injection. No increased resistance to injection. Injection made in 5cc increments.  Good needle visualization.  Patient tolerated procedure well.

## 2017-01-16 ENCOUNTER — Encounter (HOSPITAL_COMMUNITY): Payer: Self-pay | Admitting: Orthopedic Surgery

## 2017-01-16 LAB — CBC
HCT: 39.1 % (ref 39.0–52.0)
Hemoglobin: 12.6 g/dL — ABNORMAL LOW (ref 13.0–17.0)
MCH: 29.1 pg (ref 26.0–34.0)
MCHC: 32.2 g/dL (ref 30.0–36.0)
MCV: 90.3 fL (ref 78.0–100.0)
PLATELETS: 181 10*3/uL (ref 150–400)
RBC: 4.33 MIL/uL (ref 4.22–5.81)
RDW: 13.8 % (ref 11.5–15.5)
WBC: 16 10*3/uL — ABNORMAL HIGH (ref 4.0–10.5)

## 2017-01-16 MED ORDER — SUMATRIPTAN SUCCINATE 25 MG PO TABS: 25.0000 mg | ORAL_TABLET | Freq: Two times a day (BID) | ORAL | Status: DC | PRN

## 2017-01-16 NOTE — Progress Notes (Signed)
Subjective:    Objective: Vital signs in last 24 hours: Temp:  [98.2 F (36.8 C)-99.8 F (37.7 C)] 98.7 F (37.1 C) (04/04 0600) Pulse Rate:  [95-115] 95 (04/04 0600) Resp:  [18] 18 (04/04 0600) BP: (129-146)/(64-93) 129/64 (04/04 0600) SpO2:  [95 %-96 %] 96 % (04/04 0600)  Intake/Output from previous day: 04/03 0701 - 04/04 0700 In: 2500 [P.O.:480; I.V.:1770; IV Piggyback:250] Out: 1650 [Urine:1350; Blood:300] Intake/Output this shift: Total I/O In: 240 [P.O.:240] Out: -   Exam:  Dorsiflexion/Plantar flexion intact  Labs:  Recent Labs  01/16/17 0329  HGB 12.6*    Recent Labs  01/16/17 0329  WBC 16.0*  RBC 4.33  HCT 39.1  PLT 181   No results for input(s): NA, K, CL, CO2, BUN, CREATININE, GLUCOSE, CALCIUM in the last 72 hours. No results for input(s): LABPT, INR in the last 72 hours.  Assessment/Plan: Plan at this time is for continued therapy.  We will need to see if he is going to be more of the patient and he can go home or if he will need skilled nursing facility temporary stay.   Landry Dyke Leyla Soliz 01/16/2017, 12:37 PM

## 2017-01-16 NOTE — Progress Notes (Signed)
Orthopedic Tech Progress Note Patient Details:  Nicholas [name] [birth year] [id number]  CPM Left Knee CPM Left Knee: On Left Knee Flexion (Degrees): 40 Left Knee Extension (Degrees): 0  Ortho Devices Ortho Device/Splint Location: footsie roll Ortho Device/Splint Interventions: Ordered, Application, Adjustment   Braulio Bosch 01/16/2017, 3:28 PM

## 2017-01-16 NOTE — Progress Notes (Signed)
   01/16/17 1320  Clinical Encounter Type  Visited With Patient  Visit Type Other (Comment) ([place] consult)  Spiritual Encounters  Spiritual Needs Literature  Stress Factors  Patient Stress Factors Health changes  Introduction to Pt. Offered emotional support and Bible.

## 2017-01-16 NOTE — Anesthesia Postprocedure Evaluation (Signed)
Anesthesia Post Note  Patient: Nicholas [name]  Procedure(s) Performed: Procedure(s) (LRB): TOTAL KNEE ARTHROPLASTY (Left)  Patient location during evaluation: PACU Anesthesia Type: General Level of consciousness: awake and alert Pain management: pain level controlled Vital Signs Assessment: post-procedure vital signs reviewed and stable Respiratory status: spontaneous breathing, nonlabored ventilation, respiratory function stable and patient connected to nasal cannula oxygen Cardiovascular status: blood pressure returned to baseline and stable Postop Assessment: no signs of nausea or vomiting Anesthetic complications: no       Last Vitals:  Vitals:   01/15/17 2033 01/16/17 0600  BP: (!) 137/93 129/64  Pulse: (!) 115 95  Resp: 18 18  Temp: 37.7 C 37.1 C    Last Pain:  Vitals:   01/16/17 1020  TempSrc:   PainSc: [name]

## 2017-01-16 NOTE — Discharge Instructions (Signed)

## 2017-01-16 NOTE — Progress Notes (Signed)
qPhysical Therapy Treatment Patient Details Name: Nicholas [name] MRN: [id number] DOB: Feb 25, 1951 Today's Date: 01/16/2017    History of Present Illness pt is a 66 y/o male with h/o DDD OA, past R TKA with complication admitted with arthritic L knee which has failed conservative management, s/p LTKA.    PT Comments    Patient is progressing toward mobility goals.  Patient needs to practice stairs next session.   Continue to progress as tolerated.   Follow Up Recommendations  Home health PT;Supervision - Intermittent     Equipment Recommendations  3in1 (PT)    Recommendations for Other Services       Precautions / Restrictions Precautions Precautions: Fall Restrictions Weight Bearing Restrictions: Yes LLE Weight Bearing: Weight bearing as tolerated    Mobility  Bed Mobility Overal bed mobility: Needs Assistance Bed Mobility: Supine to Sit     Supine to sit: Min assist (To mobilize LLE)     General bed mobility comments: pt OOB in chair upon arrival  Transfers Overall transfer level: Needs assistance Equipment used: Rolling walker (2 wheeled) Transfers: Sit to/from Stand Sit to Stand: Mod assist         General transfer comment: assist for balance when standing and to power up into standing fully upright; cues for sequencing and technique; pt with difficulty standing and descending slowly back to sitting due to R knee fusion in extension   Ambulation/Gait Ambulation/Gait assistance: Min guard Ambulation Distance (Feet): 175 Feet Assistive device: Rolling walker (2 wheeled) Gait Pattern/deviations: Step-through pattern;Decreased stance time - left;Decreased step length - right;Decreased weight shift to left;Trunk flexed Gait velocity: decreased   General Gait Details: cues for posture, sequencing, and proximity of RW   Stairs            Wheelchair Mobility    Modified Rankin (Stroke Patients Only)       Balance Overall balance assessment: Needs  assistance Sitting-balance support: Feet supported;No upper extremity supported Sitting balance-Leahy Scale: Good Sitting balance - Comments: Maintained good sitting posture at EOB   Standing balance support: Bilateral upper extremity supported;During functional activity Standing balance-Leahy Scale: Fair Standing balance comment: Able to perform groomign and toileting with min guard for safety                            Cognition Arousal/Alertness: Awake/alert Behavior During Therapy: WFL for tasks assessed/performed Overall Cognitive Status: Within Functional Limits for tasks assessed                                        Exercises Total Joint Exercises Quad Sets: AROM;10 reps;Left Short Arc Quad: AROM;Left;10 reps Heel Slides: AAROM;AROM;Left;10 reps Long Arc Quad: AROM;Left;10 reps;Seated Goniometric ROM: approx 5-80    General Comments        Pertinent Vitals/Pain Pain Assessment: 0-10 Pain Score: 7  Pain Location: knee Pain Descriptors / Indicators: Sore;Guarding;Grimacing;Tightness Pain Intervention(s): Limited activity within patient's tolerance;Monitored during session;Premedicated before session;Repositioned    Home Living Family/patient expects to be discharged to:: Private residence Living Arrangements: Alone Available Help at Discharge: Available PRN/intermittently Type of Home: House Home Access: Level entry   Home Layout: Two level;Able to live on main level with bedroom/bathroom Home Equipment: Gilford Rile - 2 wheels;Cane - single point;Shower seat - built in;Wheelchair - manual Additional Comments: toilet space is very narrow, will not accept a BSC  Prior Function Level of Independence: Independent          PT Goals (current goals can now be found in the care plan section) Acute Rehab PT Goals Patient Stated Goal: Home independently PT Goal Formulation: With patient Time For Goal Achievement: 01/15/17 Potential to  Achieve Goals: Good Progress towards PT goals: Progressing toward goals    Frequency    7X/week      PT Plan Current plan remains appropriate    Co-evaluation             End of Session Equipment Utilized During Treatment: Gait belt Activity Tolerance: Patient tolerated treatment well Patient left: in chair;with call bell/phone within reach Nurse Communication: Mobility status PT Visit Diagnosis: Pain;Difficulty in walking, not elsewhere classified (R26.2) Pain - Right/Left: Left Pain - part of body: Knee     Time: 1019-1101 PT Time Calculation (min) (ACUTE ONLY): 42 min  Charges:  $Gait Training: 8-22 mins $Therapeutic Exercise: 8-22 mins $Therapeutic Activity: 8-22 mins                    G Codes:       Earney Navy, PTA Pager: [phone]     Darliss Cheney 01/16/2017, 12:25 PM

## 2017-01-16 NOTE — Plan of Care (Signed)
Problem: Physical Regulation: Goal: Postoperative complications will be avoided or minimized Outcome: Progressing No complications noted  Problem: Pain Management: Goal: Pain level will decrease with appropriate interventions Outcome: Progressing Medicated once for pain tonight

## 2017-01-16 NOTE — Evaluation (Signed)
Occupational Therapy Evaluation Patient Details Name: Nicholas [name] MRN: [id number] DOB: 11/30/1950 Today's Date: 01/16/2017    History of Present Illness pt is a 66 y/o male with h/o DDD OA, past R TKA with complication admitted with arthritic L knee which has failed conservative management, s/p LTKA.   Clinical Impression   PTA, pt was living alone and independent. Pt had previous knee surgery with rod placed and knee positioned in extension. Pt currently Max A for LB ADLs and Min guard for UB ADLs and grooming at sink. Pt required Mod-Max A +2 for sit<>stand due to limited ROM in B knees. Feel pt will progress with transfer once he can tolerance increased weight in surgical knee.  Pt would benefit from skilled OT for LB ADLs and functional transfers. Recommend dc home with HHOT to increase safety and independence in ADLs.     Follow Up Recommendations  Home health OT;Supervision - Intermittent (Pending pt progress to decide if SNF rehab is needed)    Equipment Recommendations  3 in 1 bedside commode    Recommendations for Other Services       Precautions / Restrictions Precautions Precautions: Fall Restrictions Weight Bearing Restrictions: Yes LLE Weight Bearing: Weight bearing as tolerated      Mobility Bed Mobility Overal bed mobility: Needs Assistance Bed Mobility: Supine to Sit     Supine to sit: Min assist (To mobilize LLE)        Transfers Overall transfer level: Needs assistance Equipment used: Rolling walker (2 wheeled) Transfers: Sit to/from Stand Sit to Stand: From elevated surface;Max assist;+2 physical assistance;Mod assist         General transfer comment: Pt Max A +2 for sit>stand due to limited ROM in B knees. Mod A to descend to recliner seat    Balance Overall balance assessment: Needs assistance Sitting-balance support: Feet supported;No upper extremity supported Sitting balance-Leahy Scale: Fair Sitting balance - Comments: Maintained good  sitting posture at EOB   Standing balance support: Bilateral upper extremity supported;During functional activity Standing balance-Leahy Scale: Fair Standing balance comment: Able to perform groomign and toileting with min guard for safety                           ADL either performed or assessed with clinical judgement   ADL Overall ADL's : Needs assistance/impaired Eating/Feeding: Set up;Sitting   Grooming: Oral care;Min guard;Standing   Upper Body Bathing: Minimal assistance;Sitting   Lower Body Bathing: Maximal assistance;Sit to/from stand   Upper Body Dressing : Minimal assistance;Sitting   Lower Body Dressing: Maximal assistance;Sit to/from stand Lower Body Dressing Details (indicate cue type and reason): Pt would benefit from AE education for LB ADLs. Toilet Transfer: Ambulation;RW;Moderate assistance (Sumilated at [employer]) Armed forces technical officer Details (indicate cue type and reason): Simulated to recliner. Required Mod A to lower down to seat.  Toileting- Water quality scientist and Hygiene: Min guard Toileting - Clothing Manipulation Details (indicate cue type and reason): Standing at toilet for urination with Min guard A     Functional mobility during ADLs: Minimal assistance;Rolling walker General ADL Comments: Pt maintained good standing posture and balance to perform grooming and toileting (did not urinate but tried). However, pt required Mod-Max+2 A for transfers due to limited ROM in non-surgical knee     Vision         Perception     Praxis      Pertinent Vitals/Pain Pain Assessment: 0-10 Pain Score: 5  Pain  Location: knee Pain Descriptors / Indicators: Sore;Guarding;Grimacing Pain Intervention(s): Monitored during session     Hand Dominance Right   Extremity/Trunk Assessment Upper Extremity Assessment Upper Extremity Assessment: Overall WFL for tasks assessed   Lower Extremity Assessment Lower Extremity Assessment: RLE deficits/detail;LLE  deficits/detail;Defer to PT evaluation RLE Deficits / Details: knee fused in full extension LLE Deficits / Details: limited flexion due to pain   Cervical / Trunk Assessment Cervical / Trunk Assessment: Normal   Communication Communication Communication: No difficulties   Cognition Arousal/Alertness: Awake/alert Behavior During Therapy: WFL for tasks assessed/performed Overall Cognitive Status: Within Functional Limits for tasks assessed                                     General Comments   Educated pt on donning/doffing knee immobilizer; pt verbalized understanding.     Exercises     Shoulder Instructions      Home Living Family/patient expects to be discharged to:: Private residence Living Arrangements: Alone Available Help at Discharge: Available PRN/intermittently Type of Home: House Home Access: Level entry     Home Layout: Two level;Able to live on main level with bedroom/bathroom     Bathroom Shower/Tub: Occupational psychologist: Standard     Home Equipment: Environmental consultant - 2 wheels;Cane - single point;Shower seat - built in;Wheelchair - manual   Additional Comments: toilet space is very narrow, will not accept a BSC      Prior Functioning/Environment Level of Independence: Independent                 OT Problem List: Decreased strength;Decreased activity tolerance;Impaired balance (sitting and/or standing);Pain;Decreased knowledge of use of DME or AE      OT Treatment/Interventions: Self-care/ADL training;Therapeutic exercise;Energy conservation;DME and/or AE instruction;Therapeutic activities;Patient/family education    OT Goals(Current goals can be found in the care plan section) Acute Rehab OT Goals Patient Stated Goal: Home independently OT Goal Formulation: With patient Time For Goal Achievement: 01/30/17 Potential to Achieve Goals: Good ADL Goals Pt Will Perform Grooming: with supervision;with set-up;standing Pt Will  Perform Lower Body Dressing: with adaptive equipment;sit to/from stand;with min assist Pt Will Transfer to Toilet: with min assist;bedside commode;ambulating  OT Frequency: Min 2X/week   Barriers to D/C: Decreased caregiver support          Co-evaluation              End of Session Equipment Utilized During Treatment: Gait belt;Rolling walker;Left knee immobilizer Nurse Communication: Mobility status  Activity Tolerance: Patient tolerated treatment well Patient left: in chair;with call bell/phone within reach  OT Visit Diagnosis: Unsteadiness on feet (R26.81);Muscle weakness (generalized) (M62.81);Pain Pain - Right/Left: Left Pain - part of body: Knee                Time: 2778-2423 OT Time Calculation (min): 39 min Charges:  OT General Charges $OT Visit: 1 Procedure OT Evaluation $OT Eval Moderate Complexity: 1 Procedure OT Treatments $Self Care/Home Management : 8-22 mins G-Codes:     [employer], OTR/L Levy 01/16/2017, 12:02 PM

## 2017-01-16 NOTE — Progress Notes (Signed)
qPhysical Therapy Treatment Patient Details Name: Nicholas [name] MRN: [id number] DOB: Jun 15, 1951 Today's Date: 01/16/2017    History of Present Illness pt is a 66 y/o male with h/o DDD OA, past R TKA with complication admitted with arthritic L knee which has failed conservative management, s/p LTKA.    PT Comments    This session focused on stair negotiation and transfers. Patient continues to require mod A for sit to stand transfers and mod cues for technique. Pt also required mod A and max cues for negotiation of 2 steps with bilat hand rails (R hand rail only on 17 stairs at home). Pt is unable to use downstairs bathroom due to inability to fit RW in room and upstairs toilet is currently not functioning as reported by pt this session. Given pt's current mobility level and limited assistance available recommending [facility] for further skilled PT services to maximize independence and safety with mobility prior to d/c home.    Follow Up Recommendations  SNF;Supervision/Assistance - 24 hour     Equipment Recommendations  3in1 (PT)    Recommendations for Other Services       Precautions / Restrictions Precautions Precautions: Fall Restrictions Weight Bearing Restrictions: Yes LLE Weight Bearing: Weight bearing as tolerated    Mobility  Bed Mobility               General bed mobility comments: pt OOB in chair upon arrival  Transfers Overall transfer level: Needs assistance Equipment used: Rolling walker (2 wheeled) Transfers: Sit to/from Stand Sit to Stand: Mod assist         General transfer comment: assist for balance when standing and to power up into standing fully upright; cues for sequencing and technique; pt with difficulty standing and descending slowly back to sitting due to R knee fusion in extension   Ambulation/Gait Ambulation/Gait assistance: Min guard Ambulation Distance (Feet): 20 Feet Assistive device: Rolling walker (2 wheeled) Gait  Pattern/deviations: Step-through pattern;Decreased stance time - left;Decreased step length - right;Decreased weight shift to left;Trunk flexed Gait velocity: decreased   General Gait Details: cues for posture, sequencing, and proximity of RW   Stairs Stairs: Yes   Stair Management: Two rails;Step to pattern;Forwards;With cane Number of Stairs: 2 General stair comments: cues for sequencing and technique; pt unable to ascend leading with R LE and unable to descend leading with L LE due to R knee fusion; SPC and R hand rail used to ascend and pt unable to descend without bilat handrails; pt with only R hand rail at home; mod A required  Wheelchair Mobility    Modified Rankin (Stroke Patients Only)       Balance     Sitting balance-Leahy Scale: Good       Standing balance-Leahy Scale: Poor                              Cognition Arousal/Alertness: Awake/alert Behavior During Therapy: WFL for tasks assessed/performed Overall Cognitive Status: Within Functional Limits for tasks assessed                                        Exercises Total Joint Exercises Quad Sets: AROM;10 reps;Left Short Arc Quad: AROM;Left;10 reps Heel Slides: AAROM;AROM;Left;10 reps Long Arc Quad: AROM;Left;10 reps;Seated Goniometric ROM: approx 5-80    General Comments  Pertinent Vitals/Pain Pain Assessment: Faces Pain Score: 7  Faces Pain Scale: Hurts whole lot Pain Location: L knee with stair negotiation Pain Descriptors / Indicators: Sore;Guarding;Grimacing;Moaning;Sharp Pain Intervention(s): Limited activity within patient's tolerance;Monitored during session;Premedicated before session;Repositioned    Home Living                      Prior Function            PT Goals (current goals can now be found in the care plan section) Acute Rehab PT Goals Patient Stated Goal: Home independently PT Goal Formulation: With patient Time For Goal  Achievement: 01/15/17 Potential to Achieve Goals: Good Progress towards PT goals: Progressing toward goals    Frequency    7X/week      PT Plan Discharge plan needs to be updated    Co-evaluation             End of Session Equipment Utilized During Treatment: Gait belt Activity Tolerance: Patient tolerated treatment well Patient left: in chair;with call bell/phone within reach Nurse Communication: Mobility status PT Visit Diagnosis: Pain;Difficulty in walking, not elsewhere classified (R26.2) Pain - Right/Left: Left Pain - part of body: Knee     Time: 1340-1417 PT Time Calculation (min) (ACUTE ONLY): 37 min  Charges:  $Gait Training: 8-22 mins $Therapeutic Activity: 8-22 mins                    G Codes:       Earney Navy, PTA Pager: [phone]     Darliss Cheney 01/16/2017, 2:48 PM

## 2017-01-16 NOTE — Progress Notes (Signed)
Orthopedic Tech Progress Note Patient Details:  [name] 11/12/1950 [id number] On cpm at [facility] Patient ID: Nicholas [name], male   DOB: 19-Oct-1950, 66 y.o.   MRN: [id number]   Braulio Bosch 01/16/2017, 6:30 PM

## 2017-01-17 NOTE — Progress Notes (Addendum)
OT Treatment 01/17/2017   Provided pt with education on LB ADLs with AE. Pt demonstrated understanding by donning socks and underwear with supervision. Pt reports severe pain with LLE movement. Pt continues to have difficulty with transfers due to pain in surgical leg and extension of right leg. Pt would benefit from further OT at SNF to facilitate safe transition home and return to PLOF. Will continue to follow acutely.     01/17/17 1200  OT Visit Information  Last OT Received On 01/17/17  Assistance Needed +1  History of Present Illness pt is a 67 y/o male with h/o DDD OA, past R TKA with complication admitted with arthritic L knee which has failed conservative management, s/p LTKA.  Precautions  Precautions Fall  Pain Assessment  Pain Assessment 0-10  Pain Score 8  Faces Pain Scale 8  Pain Location L knee during movement  Pain Descriptors / Indicators Sore;Guarding;Grimacing;Moaning;Sharp  Pain Intervention(s) Monitored during session;Limited activity within patient's tolerance  Cognition  Arousal/Alertness Awake/alert  Behavior During Therapy WFL for tasks assessed/performed  Overall Cognitive Status Within Functional Limits for tasks assessed  ADL  Overall ADL's  Needs assistance/impaired  Lower Body Bathing Set up;Supervison/ safety;Sitting/lateral leans;With adaptive equipment  Lower Body Dressing Supervision/safety;Set up;Cueing for compensatory techniques;Sitting/lateral leans;With adaptive equipment  Lower Body Dressing Details (indicate cue type and reason) Pt demosntrated understanding of AE for LB ADLs.   General ADL Comments Pt continues to have difficulty with sit<>Stand transfer and declined to transfer at this time since he just completed PT.   Bed Mobility  General bed mobility comments In recliner upon arrival  OT - End of Session  Equipment Utilized During Treatment Other (comment) (AE)  Activity Tolerance Patient limited by pain  Patient left in chair;with call  bell/phone within reach  Nurse Communication Mobility status  OT Assessment/Plan  OT Plan Discharge plan needs to be updated  OT Visit Diagnosis Unsteadiness on feet (R26.81);Muscle weakness (generalized) (M62.81);Pain  Pain - Right/Left Left  Pain - part of body Knee  OT Frequency (ACUTE ONLY) Min 2X/week  Recommendations for Other Services PT consult  Follow Up Recommendations SNF;Supervision/Assistance - 24 hour  OT Equipment Other (comment) (Defer to next venue)  AM-PAC OT "6 Clicks" Daily Activity Outcome Measure  Help from another person eating meals? 4  Help from another person taking care of personal grooming? 3  Help from another person toileting, which includes using toliet, bedpan, or urinal? 2  Help from another person bathing (including washing, rinsing, drying)? 2  Help from another person to put on and taking off regular upper body clothing? 3  Help from another person to put on and taking off regular lower body clothing? 2  6 Click Score 16  ADL G Code Conversion CK  OT Goal Progression  Progress towards OT goals Progressing toward goals  Acute Rehab OT Goals  Patient Stated Goal Home independently  OT Goal Formulation With patient  Time For Goal Achievement 01/30/17  Potential to Achieve Goals Good  ADL Goals  Pt Will Perform Grooming with supervision;with set-up;standing  Pt Will Perform Lower Body Dressing with adaptive equipment;sit to/from stand;with min assist  Pt Will Transfer to Toilet with min assist;bedside commode;ambulating  OT Time Calculation  OT Start Time (ACUTE ONLY) 1202  OT Stop Time (ACUTE ONLY) 1220  OT Time Calculation (min) 18 min  OT Treatments  $Self Care/Home Management  8-22 mins    [place], OTR/L [phone] 01/16/2017

## 2017-01-17 NOTE — Progress Notes (Signed)
Orthopedic Tech Progress Note Patient Details:  Nicholas [name] 1951-07-04 [id number]  CPM Left Knee CPM Left Knee: On Left Knee Flexion (Degrees): 65 Left Knee Extension (Degrees): 0   [place] Pink 01/17/2017, 9:10 PM

## 2017-01-17 NOTE — Progress Notes (Addendum)
qPhysical Therapy Treatment Patient Details Name: Nicholas [name] MRN: [id number] DOB: November 24, 1950 Today's Date: 01/17/2017    History of Present Illness pt is a 66 y/o male with h/o DDD OA, past R TKA with complication admitted with arthritic L knee which has failed conservative management, s/p LTKA.    PT Comments    Continue to recommend SNF for further skilled PT services as pt requires max/total A +2 to get EOB and sit/stand from EOB and +2 to sit/stand from recliner with armrests. HR up to 131 and SpO2 91% on RA with mobility. Continue to progress as tolerated with anticipated d/c to SNF for further skilled PT services.      Follow Up Recommendations  SNF;Supervision/Assistance - 24 hour     Equipment Recommendations  3in1 (PT)    Recommendations for Other Services       Precautions / Restrictions Precautions Precautions: Fall Restrictions Weight Bearing Restrictions: Yes LLE Weight Bearing: Weight bearing as tolerated    Mobility  Bed Mobility Overal bed mobility: Needs Assistance Bed Mobility: Supine to Sit     Supine to sit: Max assist     General bed mobility comments: assist to bring bilat LE to EOB and to elevate trunk into sitting while bringing hips toward EOB with use of bed pad; HOB flat; pt is unable to sit up without max assist  Transfers Overall transfer level: Needs assistance Equipment used: Rolling walker (2 wheeled) Transfers: Sit to/from Stand Sit to Stand: Max assist;Total assist;+2 physical assistance;From elevated surface         General transfer comment: from EOB; first trial unsuccessful with bed at height simulating home; pt required max to total A +2 with use of gait belt and bed pad to come into standing with cues for technique and hand placement; pt also required assist to safely descend to recliner   Ambulation/Gait Ambulation/Gait assistance: Min assist;+2 safety/equipment Ambulation Distance (Feet): 90 Feet Assistive device:  Rolling walker (2 wheeled) Gait Pattern/deviations: Decreased stance time - left;Decreased weight shift to left;Trunk flexed;Step-through pattern;Decreased step length - right;Decreased stride length Gait velocity: decreased   General Gait Details: cues for posture, sequencing, and proximity of RW; pt with increased HR up to 131 and SpO2 down to 91% on RA with mobility   Stairs            Wheelchair Mobility    Modified Rankin (Stroke Patients Only)       Balance   Sitting-balance support: Bilateral upper extremity supported Sitting balance-Leahy Scale: Fair       Standing balance-Leahy Scale: Poor                              Cognition Arousal/Alertness: Awake/alert Behavior During Therapy: WFL for tasks assessed/performed Overall Cognitive Status: Within Functional Limits for tasks assessed                                        Exercises      General Comments        Pertinent Vitals/Pain Pain Assessment: Faces Pain Score: 8  Faces Pain Scale: Hurts even more Pain Location: L knee with mobility--especially transitional movements Pain Descriptors / Indicators: Sore;Guarding;Grimacing;Moaning;Sharp Pain Intervention(s): Limited activity within patient's tolerance;Monitored during session;Premedicated before session;Repositioned    Home Living  Prior Function            PT Goals (current goals can now be found in the care plan section) Acute Rehab PT Goals PT Goal Formulation: With patient Time For Goal Achievement: 01/15/17 Potential to Achieve Goals: Good Progress towards PT goals: Progressing toward goals    Frequency    7X/week      PT Plan Current plan remains appropriate    Co-evaluation             End of Session Equipment Utilized During Treatment: Gait belt Activity Tolerance: Patient tolerated treatment well Patient left: in chair;with call bell/phone within  reach Nurse Communication: Mobility status PT Visit Diagnosis: Pain;Difficulty in walking, not elsewhere classified (R26.2) Pain - Right/Left: Left Pain - part of body: Knee     Time: 1135-1205 PT Time Calculation (min) (ACUTE ONLY): 30 min  Charges:  $Gait Training: 8-22 mins $Therapeutic Activity: 8-22 mins                    G Codes:       Earney Navy, PTA Pager: [phone]     Darliss Cheney 01/17/2017, 3:25 PM

## 2017-01-17 NOTE — Progress Notes (Signed)
qPhysical Therapy Treatment Patient Details Name: Nicholas [name] MRN: [id number] DOB: Apr 09, 1951 Today's Date: 01/17/2017    History of Present Illness pt is a 66 y/o male with h/o DDD OA, past R TKA with complication admitted with arthritic L knee which has failed conservative management, s/p LTKA.    PT Comments    Patient able to complete most of HEP this session with assistance. Pt with improved ability to perform SLR. Continue to progress as tolerated.    Follow Up Recommendations  SNF;Supervision/Assistance - 24 hour     Equipment Recommendations  3in1 (PT)    Recommendations for Other Services       Precautions / Restrictions Precautions Precautions: Fall Restrictions Weight Bearing Restrictions: Yes LLE Weight Bearing: Weight bearing as tolerated    Mobility  Bed Mobility Overal bed mobility: Needs Assistance Bed Mobility: Supine to Sit     Supine to sit: Max assist     General bed mobility comments: assist to bring bilat LE to EOB and to elevate trunk into sitting while bringing hips toward EOB with use of bed pad; HOB flat; pt is unable to sit up without max assist  Transfers Overall transfer level: Needs assistance Equipment used: Rolling walker (2 wheeled) Transfers: Sit to/from Stand Sit to Stand: Max assist;Total assist;+2 physical assistance;From elevated surface         General transfer comment: from EOB; first trial unsuccessful with bed at height simulating home; pt required max to total A +2 with use of gait belt and bed pad to come into standing with cues for technique and hand placement; pt also required assist to safely descend to recliner   Ambulation/Gait Ambulation/Gait assistance: Min assist;+2 safety/equipment Ambulation Distance (Feet): 90 Feet Assistive device: Rolling walker (2 wheeled) Gait Pattern/deviations: Decreased stance time - left;Decreased weight shift to left;Trunk flexed;Step-through pattern;Decreased step length -  right;Decreased stride length Gait velocity: decreased   General Gait Details: cues for posture, sequencing, and proximity of RW; pt with increased HR up to 131 and SpO2 down to 91% on RA with mobility   Stairs            Wheelchair Mobility    Modified Rankin (Stroke Patients Only)       Balance   Sitting-balance support: Bilateral upper extremity supported Sitting balance-Leahy Scale: Fair       Standing balance-Leahy Scale: Poor                              Cognition Arousal/Alertness: Awake/alert Behavior During Therapy: WFL for tasks assessed/performed Overall Cognitive Status: Within Functional Limits for tasks assessed                                        Exercises Total Joint Exercises Quad Sets: AROM;Both;10 reps Towel Squeeze: AROM;10 reps Short Arc Quad: AAROM;Left;10 reps Heel Slides: AAROM;Left;10 reps Hip ABduction/ADduction: AAROM;Left;10 reps Straight Leg Raises: AAROM;Left;5 reps Goniometric ROM: approx 5-75     General Comments        Pertinent Vitals/Pain Pain Assessment: Faces Faces Pain Scale: Hurts even more Pain Location: L knee  Pain Descriptors / Indicators: Sore;Guarding;Grimacing;Moaning Pain Intervention(s): Limited activity within patient's tolerance;Monitored during session;Premedicated before session;Repositioned    Home Living  Prior Function            PT Goals (current goals can now be found in the care plan section) Acute Rehab PT Goals PT Goal Formulation: With patient Time For Goal Achievement: 01/15/17 Potential to Achieve Goals: Good Progress towards PT goals: Progressing toward goals    Frequency    7X/week      PT Plan Current plan remains appropriate    Co-evaluation             End of Session Equipment Utilized During Treatment: Gait belt Activity Tolerance: Patient tolerated treatment well Patient left: with call bell/phone  within reach;in bed;in CPM Nurse Communication: Mobility status PT Visit Diagnosis: Pain;Difficulty in walking, not elsewhere classified (R26.2) Pain - Right/Left: Left Pain - part of body: Knee     Time: 3220-2542 PT Time Calculation (min) (ACUTE ONLY): 40 min  Charges:   $Therapeutic Exercise: 38-52 mins                     G Codes:       Earney Navy, PTA Pager: [phone]     Darliss Cheney 01/17/2017, 4:29 PM

## 2017-01-17 NOTE — Plan of Care (Signed)
Problem: Safety: Goal: Ability to remain free from injury will improve Outcome: Progressing Safety precautions maintained, no fall or injury noted  Problem: Tissue Perfusion: Goal: Risk factors for ineffective tissue perfusion will decrease Outcome: Progressing Denies signs of DVT  Problem: Bowel/Gastric: Goal: Will not experience complications related to bowel motility Outcome: Progressing Denies gastric and bowel issues  Problem: Physical Regulation: Goal: Postoperative complications will be avoided or minimized Outcome: Progressing No post op complications noted  Problem: Pain Management: Goal: Pain level will decrease with appropriate interventions Outcome: Progressing Denied needs for pain medications

## 2017-01-18 MED ORDER — MAGNESIUM CITRATE PO SOLN
1.0000 | Freq: Once | ORAL | Status: AC
  Administered 2017-01-18: 1 via ORAL
  Filled 2017-01-18: qty 296

## 2017-01-18 NOTE — Progress Notes (Signed)
Physical Therapy Treatment Patient Details Name: Nicholas [name] MRN: [id number] DOB: 06-Mar-1951 Today's Date: 01/18/2017    History of Present Illness pt is a 66 y/o male with h/o DDD OA, past R TKA with complication admitted with arthritic L knee which has failed conservative management, s/p LTKA.    PT Comments    Patient very limited by pain this session. AP transfer performed from recliner to bed with +3 assist after several attempts to stand from recliner with +2 assist. Continue to progress as tolerated with anticipated d/c to SNF for further skilled PT services.     Follow Up Recommendations  SNF;Supervision/Assistance - 24 hour     Equipment Recommendations  3in1 (PT)    Recommendations for Other Services       Precautions / Restrictions Precautions Precautions: Fall Restrictions Weight Bearing Restrictions: Yes LLE Weight Bearing: Weight bearing as tolerated    Mobility  Bed Mobility Overal bed mobility: Needs Assistance Bed Mobility: Rolling;Sit to Supine Rolling: Min guard;Min assist   Supine to sit: Max assist;+2 for physical assistance;HOB elevated Sit to supine: Mod assist;+2 for physical assistance   General bed mobility comments: AP transfer back to bed; mod A +2 to position bilat LE and trunk into supine; min guard to roll toward R side and min A to roll toward L side; cues for sequencing and technique to use bilat UE   Transfers Overall transfer level: Needs assistance Equipment used: Rolling walker (2 wheeled) Transfers: Radiographer, therapeutic Sit to Stand: Max assist;Total assist;+2 physical assistance;From elevated surface     Anterior-Posterior transfers: Max assist (+3)   General transfer comment: several attempts of sit to stand from recliner with use of gait belt and bed pad with +2 total assist; pt unable to flex L knee and weightbear on L LE enough to get buttocks from surface due to pain; AP transfer performed from recliner to bed with  +3 assist and cues for sequencing/technique   Ambulation/Gait Ambulation/Gait assistance: Min assist Ambulation Distance (Feet): 100 Feet Assistive device: Rolling walker (2 wheeled) Gait Pattern/deviations: Decreased stance time - left;Decreased step length - right;Decreased stride length;Antalgic;Trunk flexed Gait velocity: decreased   General Gait Details: cues for posture and sequencing; worked on step Education officer, museum Rankin (Stroke Patients Only)       Balance                                            Cognition Arousal/Alertness: Awake/alert Behavior During Therapy: WFL for tasks assessed/performed Overall Cognitive Status: Within Functional Limits for tasks assessed                                        Exercises      General Comments        Pertinent Vitals/Pain Pain Assessment: Faces Pain Score: 6  Faces Pain Scale: Hurts whole lot Pain Location: L knee with mobility  Pain Descriptors / Indicators: Sore;Guarding;Grimacing;Moaning;Sharp Pain Intervention(s): Limited activity within patient's tolerance;Monitored during session;Repositioned;Patient requesting pain meds-RN notified    Home Living                      Prior Function  PT Goals (current goals can now be found in the care plan section) Acute Rehab PT Goals PT Goal Formulation: With patient Time For Goal Achievement: 01/15/17 Potential to Achieve Goals: Good Progress towards PT goals: Not progressing toward goals - comment    Frequency    7X/week      PT Plan Current plan remains appropriate    Co-evaluation             End of Session Equipment Utilized During Treatment: Gait belt Activity Tolerance: Patient limited by pain Patient left: with call bell/phone within reach;in bed Nurse Communication: Mobility status;Patient requests pain meds PT Visit Diagnosis:  Pain;Difficulty in walking, not elsewhere classified (R26.2) Pain - Right/Left: Left Pain - part of body: Knee     Time: 1450-1515 PT Time Calculation (min) (ACUTE ONLY): 25 min  Charges:  $Gait Training: 8-22 mins $Therapeutic Activity: 23-37 mins                    G Codes:       Earney Navy, PTA Pager: [phone]     Darliss Cheney 01/18/2017, 4:35 PM

## 2017-01-18 NOTE — Progress Notes (Signed)
qPhysical Therapy Treatment Patient Details Name: Nicholas [name] MRN: [id number] DOB: 07-Aug-1951 Today's Date: 01/18/2017    History of Present Illness pt is a 66 y/o male with h/o DDD OA, past R TKA with complication admitted with arthritic L knee which has failed conservative management, s/p LTKA.    PT Comments    Patient continues to require max/total A +2 for bed mobility and OOB transfers. Pt is improving with gait. Continue to progress as tolerated with anticipated d/c to SNF for further skilled PT services.     Follow Up Recommendations  SNF;Supervision/Assistance - 24 hour     Equipment Recommendations  3in1 (PT)    Recommendations for Other Services       Precautions / Restrictions Precautions Precautions: Fall Restrictions Weight Bearing Restrictions: Yes LLE Weight Bearing: Weight bearing as tolerated    Mobility  Bed Mobility Overal bed mobility: Needs Assistance Bed Mobility: Supine to Sit     Supine to sit: Max assist;+2 for physical assistance;HOB elevated     General bed mobility comments: assist for all aspects of bed mobility; HOB elevated, use of rails and bed pad; cues for sequencing  Transfers Overall transfer level: Needs assistance Equipment used: Rolling walker (2 wheeled) Transfers: Sit to/from Stand Sit to Stand: Max assist;Total assist;+2 physical assistance;From elevated surface         General transfer comment: from EOB with use of gait belt and bed pad with +2 assist; cues for hand placement and technique; mod A +2 to sit in recliner with cues for hand/foot positioning and technique; pt with less difficulty descending to surface if legs are not against edge of chair, bed, etc...  Ambulation/Gait Ambulation/Gait assistance: Min assist Ambulation Distance (Feet): 100 Feet Assistive device: Rolling walker (2 wheeled) Gait Pattern/deviations: Decreased stance time - left;Decreased step length - right;Decreased stride  length;Antalgic;Trunk flexed Gait velocity: decreased   General Gait Details: cues for posture and sequencing; worked on step Education officer, museum Rankin (Stroke Patients Only)       Balance                                            Cognition Arousal/Alertness: Awake/alert Behavior During Therapy: WFL for tasks assessed/performed Overall Cognitive Status: Within Functional Limits for tasks assessed                                        Exercises      General Comments        Pertinent Vitals/Pain Pain Assessment: 0-10 Pain Score: 6  Pain Location: L knee with mobility  Pain Descriptors / Indicators: Sore;Guarding;Grimacing Pain Intervention(s): Limited activity within patient's tolerance;Monitored during session;Premedicated before session;Repositioned    Home Living                      Prior Function            PT Goals (current goals can now be found in the care plan section) Acute Rehab PT Goals PT Goal Formulation: With patient Time For Goal Achievement: 01/15/17 Potential to Achieve Goals: Good Progress towards PT goals: Progressing toward goals    Frequency  7X/week      PT Plan Current plan remains appropriate    Co-evaluation             End of Session Equipment Utilized During Treatment: Gait belt Activity Tolerance: Patient tolerated treatment well Patient left: with call bell/phone within reach;in chair;with nursing/sitter in room Nurse Communication: Mobility status PT Visit Diagnosis: Pain;Difficulty in walking, not elsewhere classified (R26.2) Pain - Right/Left: Left Pain - part of body: Knee     Time: 8016-5537 PT Time Calculation (min) (ACUTE ONLY): 49 min  Charges:  $Gait Training: 8-22 mins $Therapeutic Activity: 23-37 mins                    G Codes:       Earney Navy, PTA Pager: [phone]     Darliss Cheney 01/18/2017, 1:28 PM

## 2017-01-18 NOTE — Care Management Note (Signed)
Case Management Note  Patient Details  Name: Nicholas [name] MRN: [id number] Date of Birth: [birth year]  Subjective/Objective:    66 yr old gentleman s/p left total knee arthroplasty                Action/Plan: Patient was preoperatively setup with [facility] for therapy. CM is waiting for definitive discharge plan. Dr. [name] Sa has said patient needs shortterm rehab at [facility] and therapist feel that he does as well. CM will continue to monitor. Patient has all DME should he discharge home. Social worker is also aware of SNF need.     Expected Discharge Date:                  Expected Discharge Plan:  Skilled Nursing Facility  In-House Referral:  NA, Clinical Social Work  Discharge planning Services  CM Consult  Post Acute Care Choice:  Home Health, Durable Medical Equipment Choice offered to:  Patient  DME Arranged:  CPM DME Agency:  [employer]  HH Arranged:  PT [place] Agency:  [facility] [employer]  Status of Service:  In process, will continue to follow  If discussed at Long Length of Stay Meetings, dates discussed:    Additional Comments:  Ninfa Meeker, RN 01/18/2017, 8:46 AM

## 2017-01-18 NOTE — Care Management Important Message (Signed)
Important Message  Patient Details  Name: Nicholas [name] MRN: [id number] Date of Birth: Sep 23, 1951   Medicare Important Message Given:  Yes    [name] Montine Circle 01/18/2017, 3:54 PM

## 2017-01-18 NOTE — Progress Notes (Signed)
Subjective: Patient stable.  Pain control.  CPM at 64.  He is requiring maximal assistance to ambulate.  It's looking like skilled nursing is his best option.   Objective: Vital signs in last 24 hours: Temp:  [98.1 F (36.7 C)-98.8 F (37.1 C)] 98.8 F (37.1 C) (04/06 1405) Pulse Rate:  [100-115] 109 (04/06 1405) Resp:  [16] 16 (04/06 1405) BP: (122-143)/(66-71) 129/66 (04/06 1405) SpO2:  [94 %-96 %] 96 % (04/06 1405)  Intake/Output from previous day: 04/05 0701 - 04/06 0700 In: 960 [P.O.:960] Out: 3200 [Urine:3200] Intake/Output this shift: Total I/O In: 720 [P.O.:720] Out: 600 [Urine:600]  Exam:  Dorsiflexion/Plantar flexion intact  Labs:  Recent Labs  01/16/17 0329  HGB 12.6*    Recent Labs  01/16/17 0329  WBC 16.0*  RBC 4.33  HCT 39.1  PLT 181   No results for input(s): NA, K, CL, CO2, BUN, CREATININE, GLUCOSE, CALCIUM in the last 72 hours. No results for input(s): LABPT, INR in the last 72 hours.  Assessment/Plan: Plan at this time is to continue with CPM machine 0 bone foam and physical therapy.  Anticipate discharge to skilled nursing facility likely Monday when a bed is available.  I think it's a good idea to start that process today   Anderson Malta 01/18/2017, 3:50 PM

## 2017-01-19 NOTE — Progress Notes (Signed)
Pt stable cpm 80 Ready for snf monday

## 2017-01-19 NOTE — Progress Notes (Signed)
qPhysical Therapy Treatment Patient Details Name: Nicholas [name] MRN: [id number] DOB: [birth year] Today's Date: 01/19/2017    History of Present Illness pt is a 66 y/o male with h/o DDD OA, past R TKA with complication admitted with arthritic L knee which has failed conservative management, s/p LTKA.    PT Comments    Patient continues to have difficulty standing. His left leg and shoulders are weakend and he has difficulty using his right leg for the transfer 2nd to the fusion. Plan remain appropriate for continued rehab at a SNF.  He increased his gait distance today.   Follow Up Recommendations  SNF;Supervision/Assistance - 24 hour     Equipment Recommendations  3in1 (PT)    Recommendations for Other Services       Precautions / Restrictions Precautions Precautions: Fall Restrictions Weight Bearing Restrictions: Yes LLE Weight Bearing: Weight bearing as tolerated    Mobility  Bed Mobility Overal bed mobility: Needs Assistance Bed Mobility: Supine to Sit Rolling: Min guard;Min assist   Supine to sit: Max assist;+2 for physical assistance     General bed mobility comments: Max a to move LE out of bed and Max a to sit up at the edge. Once to the edge of the bed the patient was able to maintain seated position   Transfers Overall transfer level: Needs assistance Equipment used: Rolling walker (2 wheeled) Transfers: Sit to/from Stand Sit to Stand: Max assist;+2 physical assistance;From elevated surface         General transfer comment: MAx a to stand. Once standing Min guard for balance. Diffuclty getting right LE on the ground in a poistion he can use it 2nd to dfusion   Ambulation/Gait Ambulation/Gait assistance: Min assist Ambulation Distance (Feet): 110 Feet Assistive device: Rolling walker (2 wheeled) Gait Pattern/deviations: Decreased stance time - left;Decreased step length - right;Decreased stride length;Antalgic;Trunk flexed Gait velocity: decreased Gait  velocity interpretation: Below normal speed for age/gender General Gait Details: cues to stand tall. Leans forward onto his arms. Advised that he will have less stress on his shoulders if he stands straight.    Stairs            Wheelchair Mobility    Modified Rankin (Stroke Patients Only)       Balance Overall balance assessment: Needs assistance Sitting-balance support: Feet supported;Bilateral upper extremity supported Sitting balance-Leahy Scale: Fair     Standing balance support: Bilateral upper extremity supported Standing balance-Leahy Scale: Poor Standing balance comment: RW for support                            Cognition Arousal/Alertness: Awake/alert Behavior During Therapy: WFL for tasks assessed/performed Overall Cognitive Status: Within Functional Limits for tasks assessed                                        Exercises Total Joint Exercises Goniometric ROM: PROM perfomed for 3 miniutes with cuing for fguarding. PROM 10-64    General Comments        Pertinent Vitals/Pain Pain Assessment: Faces Faces Pain Scale: Hurts little more Pain Location: L knee Pain Descriptors / Indicators: Aching;Sore Pain Intervention(s): Monitored during session;Limited activity within patient's tolerance;Premedicated before session;Repositioned;Utilized relaxation techniques    Home Living  Prior Function            PT Goals (current goals can now be found in the care plan section) Acute Rehab PT Goals Patient Stated Goal: rehab then home PT Goal Formulation: With patient Time For Goal Achievement: 01/15/17 Potential to Achieve Goals: Good Progress towards PT goals: Progressing toward goals    Frequency    7X/week      PT Plan Current plan remains appropriate    Co-evaluation PT/OT/SLP Co-Evaluation/Treatment: Yes Reason for Co-Treatment: For patient/therapist safety PT goals addressed  during session: Mobility/safety with mobility;Proper use of DME;Strengthening/ROM OT goals addressed during session: ADL's and self-care     End of Session Equipment Utilized During Treatment: Gait belt Activity Tolerance: Patient limited by pain Patient left: with call bell/phone within reach;in chair Nurse Communication: Mobility status PT Visit Diagnosis: Pain;Difficulty in walking, not elsewhere classified (R26.2) Pain - Right/Left: Left Pain - part of body: Knee     Time: 1287-8676 PT Time Calculation (min) (ACUTE ONLY): 24 min  Charges:  $Gait Training: 8-22 mins                    G Codes:        Carney Living PT DPT  01/19/2017, 3:13 PM

## 2017-01-19 NOTE — Progress Notes (Signed)
Orthopedic Tech Progress Note Patient Details:  Nicholas [name] 01-30-1951 [id number]  CPM Left Knee CPM Left Knee: On Left Knee Flexion (Degrees): 65 Left Knee Extension (Degrees): 0   [place] Pink 01/19/2017, 5:13 PM

## 2017-01-19 NOTE — Progress Notes (Signed)
Occupational Therapy Treatment Patient Details Name: Nicholas [name] MRN: [id number] DOB: 07-04-1951 Today's Date: 01/19/2017    History of present illness pt is a 66 y/o male with h/o DDD OA, past R TKA with complication admitted with arthritic L knee which has failed conservative management, s/p LTKA.   OT comments  Pt required max assist +2 for bed mobility and sit to stand from elevated EOB. Once in standing pt able to perform functional mobility with min assist, close to min guard. Pt required max assist for donning sock to L foot. Continue to recommend SNF prior to return home. Will continue to follow acutely.   Follow Up Recommendations  SNF;Supervision/Assistance - 24 hour    Equipment Recommendations  Other (comment) (TBD at next venue)    Recommendations for Other Services      Precautions / Restrictions Precautions Precautions: Fall Restrictions Weight Bearing Restrictions: Yes LLE Weight Bearing: Weight bearing as tolerated       Mobility Bed Mobility Overal bed mobility: Needs Assistance Bed Mobility: Supine to Sit     Supine to sit: Max assist;+2 for physical assistance     General bed mobility comments: Assist for LEs to EOB, trunk elevation to sitting, and scooting hips out to EOB.   Transfers Overall transfer level: Needs assistance Equipment used: Rolling walker (2 wheeled) Transfers: Sit to/from Stand Sit to Stand: Max assist;+2 physical assistance;From elevated surface         General transfer comment: Elevated HOB; increased time and max assist to boost up to standing. RLE blocked and cues throughout for technique    Balance Overall balance assessment: Needs assistance Sitting-balance support: Feet supported;Bilateral upper extremity supported Sitting balance-Leahy Scale: Fair     Standing balance support: Bilateral upper extremity supported Standing balance-Leahy Scale: Poor Standing balance comment: RW for support                           ADL either performed or assessed with clinical judgement   ADL Overall ADL's : Needs assistance/impaired                     Lower Body Dressing: Maximal assistance Lower Body Dressing Details (indicate cue type and reason): to adjust socks Toilet Transfer: Minimal assistance;+2 for safety/equipment;Ambulation;RW Toilet Transfer Details (indicate cue type and reason): Max +2 for sit to stand, min assist for mobility. Simulated by sit to stand from elevated EOB with functional mobility.         Functional mobility during ADLs: Minimal assistance;Rolling walker       Vision       Perception     Praxis      Cognition Arousal/Alertness: Awake/alert Behavior During Therapy: WFL for tasks assessed/performed Overall Cognitive Status: Within Functional Limits for tasks assessed                                          Exercises     Shoulder Instructions       General Comments      Pertinent Vitals/ Pain       Pain Assessment: Faces Faces Pain Scale: Hurts little more Pain Location: L knee Pain Descriptors / Indicators: Aching;Sore Pain Intervention(s): Monitored during session;Repositioned  Home Living  Prior Functioning/Environment              Frequency  Min 2X/week        Progress Toward Goals  OT Goals(current goals can now be found in the care plan section)  Progress towards OT goals: Progressing toward goals  Acute Rehab OT Goals Patient Stated Goal: rehab then home OT Goal Formulation: With patient  Plan Discharge plan remains appropriate    Co-evaluation    PT/OT/SLP Co-Evaluation/Treatment: Yes Reason for Co-Treatment: For patient/therapist safety   OT goals addressed during session: ADL's and self-care      End of Session Equipment Utilized During Treatment: Gait belt;Rolling walker  OT Visit Diagnosis: Unsteadiness on feet  (R26.81);Muscle weakness (generalized) (M62.81);Pain Pain - Right/Left: Left Pain - part of body: Knee   Activity Tolerance Patient tolerated treatment well   Patient Left in chair;with call bell/phone within reach   Nurse Communication Mobility status        Time: 1610-9604 OT Time Calculation (min): 24 min  Charges: OT General Charges $OT Visit: 1 Procedure OT Treatments $Therapeutic Activity: 8-22 mins  Dominque Marlin A. Brett Albino, M.S., OTR/L Pager: [phone]   Gaye Alken 01/19/2017, 1:29 PM

## 2017-01-19 NOTE — NC FL2 (Signed)
[place] LEVEL OF CARE SCREENING TOOL     IDENTIFICATION  Patient Name: Nicholas [name] Birthdate: [birth year] Sex: male Admission Date (Current Location): 01/15/2017  [facility] and [place] Number:  Herbalist and Address:  [facility], [place] [address] [place], [place] [address]      Provider Number: [id number]  Attending Physician Name and Address:  Meredith Pel, MD  Relative Name and Phone Number:       Current Level of Care: Hospital Recommended Level of Care: [place] Prior Approval Number:    Date Approved/Denied:   PASRR Number: [id number]  Discharge Plan: SNF    Current Diagnoses: Patient Active Problem List   Diagnosis Date Noted  . Arthritis of knee 01/15/2017  . Skin rash 12/10/2016  . Acute bronchitis 11/07/2016  . Baker's cyst of knee, left 11/07/2016  . Medicare annual wellness visit, initial 10/05/2016  . Advanced care planning/counseling discussion 10/05/2016  . Nasal sore 10/05/2016  . Diarrhea 08/21/2016  . Obesity, Class II, BMI 35-39.9, with comorbidity 07/19/2016  . Hypothyroidism   . IDA (iron deficiency anemia) 04/23/2016  . Chronic pain syndrome 04/23/2016  . Inflammatory arthritis 04/23/2016  . Chronic leg pain 04/23/2016  . Lesion of palate 04/23/2016  . Grieving 04/23/2016  . GERD (gastroesophageal reflux disease)   . Duodenitis determined by biopsy 02/13/2016  . Diverticulosis of colon without hemorrhage 01/31/2016  . Migraine 01/11/2012  . MRSA (methicillin resistant Staphylococcus aureus) infection 07/04/2010  . DEGENERATIVE DISC DISEASE 07/04/2010  . History of total right knee replacement 07/04/2010  . Osteoarthritis 06/09/2007  . Personal history of colonic adenoma 06/01/2003    Orientation RESPIRATION BLADDER Height & Weight     Self, Time, Situation, Place  Normal Continent Weight:   Height:     BEHAVIORAL SYMPTOMS/MOOD NEUROLOGICAL BOWEL NUTRITION STATUS    (None)  (None) Continent Diet (Regular)  AMBULATORY STATUS COMMUNICATION OF NEEDS Skin   Limited Assist Verbally Surgical wounds, Other (Comment) (Amputation)                       Personal Care Assistance Level of Assistance  Bathing, Feeding, Dressing Bathing Assistance: Limited assistance Feeding assistance: Independent Dressing Assistance: Limited assistance     Functional Limitations Info  Sight, Hearing, Speech Sight Info: Adequate Hearing Info: Adequate Speech Info: Adequate    SPECIAL CARE FACTORS FREQUENCY  PT (By licensed PT), OT (By licensed OT)     PT Frequency: 5 x week OT Frequency: 5 x week            Contractures Contractures Info: Not present    Additional Factors Info  Code Status, Allergies Code Status Info: Full Allergies Info: Bee venom, Peanut-containing drug products, Celebrex (Celecoxib), Percoset (Oxycodone-Acetaminophen), Latex, Nsaids           Current Medications (01/19/2017):  This is the current hospital active medication list Current Facility-Administered Medications  Medication Dose Route Frequency Provider Last Rate Last Dose  . acetaminophen (TYLENOL) tablet 650 mg  650 mg Oral Q6H PRN Meredith Pel, MD       Or  . acetaminophen (TYLENOL) suppository 650 mg  650 mg Rectal Q6H PRN Meredith Pel, MD      . amitriptyline (ELAVIL) tablet 75 mg  75 mg Oral QHS Meredith Pel, MD   75 mg at 01/18/17 2205  . EPINEPHrine (EPI-PEN) injection 0.3 mg  0.3 mg Intramuscular UD [place]  [name] Sa, MD      . ferrous gluconate [facility]) tablet 324 mg  324 mg Oral Daily Meredith Pel, MD   324 mg at 01/18/17 1020  . gabapentin (NEURONTIN) capsule 300 mg  300 mg Oral TID Meredith Pel, MD   300 mg at 01/19/17 1056  . HYDROcodone-acetaminophen (NORCO) 10-325 MG per tablet 1-2 tablet  1-2 tablet Oral Q4H PRN Meredith Pel, MD   2 tablet at 01/19/17 1055  . HYDROmorphone (DILAUDID) injection 0.5 mg  0.5 mg Intravenous  Q2H PRN Meredith Pel, MD   0.5 mg at 01/17/17 1316  . levothyroxine (SYNTHROID, LEVOTHROID) tablet 50 mcg  50 mcg Oral QAC breakfast Meredith Pel, MD   50 mcg at 01/19/17 1056  . menthol-cetylpyridinium (CEPACOL) lozenge 3 mg  1 lozenge Oral PRN Meredith Pel, MD       Or  . phenol (CHLORASEPTIC) mouth spray 1 spray  1 spray Mouth/Throat PRN Meredith Pel, MD      . methocarbamol (ROBAXIN) tablet 500 mg  500 mg Oral Q6H PRN Meredith Pel, MD   500 mg at 01/18/17 1732   Or  . methocarbamol (ROBAXIN) 500 mg in dextrose 5 % 50 mL IVPB  500 mg Intravenous Q6H PRN Meredith Pel, MD      . metoCLOPramide (REGLAN) tablet 5-10 mg  5-10 mg Oral Q8H PRN Meredith Pel, MD       Or  . metoCLOPramide (REGLAN) injection 5-10 mg  5-10 mg Intravenous Q8H PRN Meredith Pel, MD      . multivitamin with minerals tablet 1 tablet  1 tablet Oral Daily Meredith Pel, MD   1 tablet at 01/19/17 1055  . ondansetron (ZOFRAN) tablet 4 mg  4 mg Oral Q6H PRN Meredith Pel, MD       Or  . ondansetron [facility]) injection 4 mg  4 mg Intravenous Q6H PRN Meredith Pel, MD      . pantoprazole (PROTONIX) EC tablet 40 mg  40 mg Oral QAC breakfast Meredith Pel, MD   40 mg at 01/19/17 1055  . rivaroxaban (XARELTO) tablet 10 mg  10 mg Oral Q breakfast Meredith Pel, MD   10 mg at 01/19/17 1056  . SUMAtriptan (IMITREX) tablet 25 mg  25 mg Oral Q12H PRN Meredith Pel, MD         Discharge Medications: Please see discharge summary for a list of discharge medications.  Relevant Imaging Results:  Relevant Lab Results:   Additional Information SS#: [id number]  Candie Chroman, LCSW

## 2017-01-19 NOTE — Clinical Social Work Placement (Addendum)
   CLINICAL SOCIAL WORK PLACEMENT  NOTE  Date:  01/19/2017  Patient Details  Name: Nicholas [name] MRN: [id number] Date of Birth: July 21, 1951  Clinical Social Work is seeking post-discharge placement for this patient at the [place] level of care (*CSW will initial, date and re-position this form in  chart as items are completed):  Yes   Patient/family provided with [place] Work Department's list of facilities offering this level of care within the geographic area requested by the patient (or if unable, by the patient's family).  Yes   Patient/family informed of their freedom to choose among providers that offer the needed level of care, that participate in Medicare, Medicaid or managed care program needed by the patient, have an available bed and are willing to accept the patient.  Yes   Patient/family informed of [facility]'s ownership interest in [facility] and [facility], as well as of the fact that they are under no obligation to receive care at these facilities.  PASRR submitted to EDS on 01/19/17     PASRR number received on       Existing PASRR number confirmed on 01/19/17     FL2 transmitted to all facilities in geographic area requested by pt/family on 01/19/17     FL2 transmitted to all facilities within larger geographic area on       Patient informed that his/her managed care company has contracts with or will negotiate with certain facilities, including the following:            Patient/family informed of bed offers received.  Patient [facility] bed [facility]     Physician recommends and patient chooses bed at      Patient to be transferred to [facility] on 01/21/17.  Patient to be transferred to facility by [facility]     Patient family notified on   of transfer.  Name of family member notified:        PHYSICIAN Please sign FL2     Additional Comment:     _______________________________________________ Candie Chroman, LCSW 01/19/2017, 12:55 PM   Loletha Grayer, [place]

## 2017-01-19 NOTE — Clinical Social Work Note (Signed)
Clinical Social Work Assessment  Patient Details  Name: Nicholas [name] MRN: [id number] Date of Birth: [birth year]  Date of referral:  01/19/17               Reason for consult:  Facility Placement, Discharge Planning                Permission sought to share information with:  Chartered certified accountant granted to share information::  Yes, Verbal Permission Granted  Name::        Agency::  SNF's  Relationship::     Contact Information:     Housing/Transportation Living arrangements for the past 2 months:  Single Family Home Source of Information:  Patient, Medical Team Patient Interpreter Needed:  None Criminal Activity/Legal Involvement Pertinent to Current Situation/Hospitalization:  No - Comment as needed Significant Relationships:  Adult Children, Siblings, Parents Lives with:  Self Do you feel safe going back to the place where you live?  Yes Need for family participation in patient care:  Yes (Comment)  Care giving concerns:  PT recommending SNF once medically stable.   Social Worker assessment / plan:  CSW met with patient. No supports at bedside. CSW introduced role and explained that PT recommendations would be discussed. Patient agreeable to SNF placement. First preference is [facility] because that is where his wife was prior to her death. No further concerns. CSW encouraged patient to contact CSW as needed. CSW will continue to follow patient for support and facilitate discharge to SNF once medically stable.  Employment status:  Retired Forensic scientist:    PT Recommendations:  [place] / Referral to community resources:  [place]  Patient/Family's Response to care:  Patient agreeable to SNF placement. Patient's mother and son supportive and involved in patient's care. Patient appreciated social work intervention.  Patient/Family's Understanding of and Emotional Response to Diagnosis, Current  Treatment, and Prognosis:  Patient has a good understanding of the reason for admission and PT recommendations. Patient appears happy with hospital care.  Emotional Assessment Appearance:  Appears stated age Attitude/Demeanor/Rapport:  Other (Pleasant) Affect (typically observed):  Accepting, Appropriate, Calm, Pleasant Orientation:  Oriented to Self, Oriented to Place, Oriented to  Time, Oriented to Situation Alcohol / Substance use:  Never Used Psych involvement (Current and /or in the community):  No (Comment)  Discharge Needs  Concerns to be addressed:  Care Coordination Readmission within the last 30 days:  No Current discharge risk:  Dependent with Mobility, Lives alone Barriers to Discharge:  Continued Medical Work up, [name], LCSW 01/19/2017, 12:53 PM

## 2017-01-19 NOTE — Progress Notes (Signed)
   Subjective:  Patient reports pain as moderate.  No events.  Objective:   VITALS:   Vitals:   01/18/17 0503 01/18/17 1405 01/18/17 2100 01/19/17 0536  BP: 122/66 129/66 117/71 119/70  Pulse: 100 (!) 109 (!) 105 (!) 106  Resp: 16 16 16 16   Temp: 98.1 F (36.7 C) 98.8 F (37.1 C) 99.6 F (37.6 C) 99.7 F (37.6 C)  TempSrc: Oral Oral Oral Oral  SpO2: 94% 96% 95% 95%    Neurologically intact Neurovascular intact Sensation intact distally Intact pulses distally Dorsiflexion/Plantar flexion intact Incision: dressing C/D/I and no drainage No cellulitis present Compartment soft   Lab Results  Component Value Date   WBC 16.0 (H) 01/16/2017   HGB 12.6 (L) 01/16/2017   HCT 39.1 01/16/2017   MCV 90.3 01/16/2017   PLT 181 01/16/2017     Assessment/Plan:  4 Days Post-Op   - Expected postop acute blood loss anemia - will monitor for symptoms - Up with PT/OT - DVT ppx - SCDs, ambulation, xarelto - WBAT operative extremity - Pain control - Discharge planning - to SNF Kelli Hope 01/19/2017, 8:37 AM [phone]

## 2017-01-19 NOTE — Progress Notes (Signed)
PT Cancellation Note  Patient Details Name: Nicholas [name] MRN: [id number] DOB: [birth year]   Cancelled Treatment:      Attempted to see patient in the afternoon for BID treatment. Patient had recently gotten back to bed and had CPM placed. Patient declined 2nd visit. Therapy to follow up on 01/19/2017 in the morning.    Carney Living 01/19/2017, 5:06 PM

## 2017-01-20 MED ORDER — ARTIFICIAL TEARS OP OINT
TOPICAL_OINTMENT | Freq: Once | OPHTHALMIC | Status: AC
  Administered 2017-01-20: 18:00:00 via OPHTHALMIC
  Filled 2017-01-20: qty 3.5

## 2017-01-20 MED ORDER — BISACODYL 10 MG RE SUPP
10.0000 mg | Freq: Every day | RECTAL | Status: DC | PRN
  Administered 2017-01-21: 10 mg via RECTAL
  Filled 2017-01-20: qty 1

## 2017-01-20 MED ORDER — EPINEPHRINE 0.3 MG/0.3ML IJ SOAJ: 0.3000 mg | INTRAMUSCULAR | Status: DC | PRN

## 2017-01-20 MED ORDER — MAGNESIUM CITRATE PO SOLN
1.0000 | Freq: Once | ORAL | Status: AC
  Administered 2017-01-20: 1 via ORAL

## 2017-01-20 NOTE — Progress Notes (Signed)
Physical Therapy Treatment Patient Details Name: Nicholas [name] MRN: [id number] DOB: 1951-02-16 Today's Date: 01/20/2017    History of Present Illness pt is a 66 y/o male with h/o DDD OA, past R TKA with complication admitted with arthritic L knee which has failed conservative management, s/p LTKA.    PT Comments    Pt presents with modest improvements in mobility but still requires inordinate amount of assistance and is appropriate for SNF level care at d/c.    Follow Up Recommendations  SNF;Supervision/Assistance - 24 hour     Equipment Recommendations  3in1 (PT)    Recommendations for Other Services       Precautions / Restrictions Precautions Precautions: Fall Precaution Comments: pt's RIGHT (non-op) knee: extension contracture/fusion, does not flex Restrictions LLE Weight Bearing: Weight bearing as tolerated    Mobility  Bed Mobility Overal bed mobility: Needs Assistance Bed Mobility: Supine to Sit     Supine to sit: Mod assist     General bed mobility comments: cues and encouragement to move to Left EOB; assist to operated leg with assist to trunk and instructional cues to use rails to lift torso; draw pad to assist hips to EOB  Transfers Overall transfer level: Needs assistance Equipment used: Rolling walker (2 wheeled) Transfers: Sit to/from Stand Sit to Stand: Max assist;From elevated surface         General transfer comment: very effortful (need +2) to come to stand as non-op right leg remains extended and left operated leg is painful to bend or bear weight; required bed surface elevation and physical assist to achieve full standing with some adjustment of foot positioning for max support with RW  Ambulation/Gait Ambulation/Gait assistance: Min guard Ambulation Distance (Feet): 175 Feet Assistive device: Rolling walker (2 wheeled) Gait Pattern/deviations: Step-to pattern;Decreased stride length;Antalgic;Trunk flexed;Narrow base of support Gait  velocity: 0.34 ft per second...  Gait velocity interpretation: <1.8 ft/sec, indicative of risk for recurrent falls General Gait Details: once up pt moves relatively well, needs only standby or hands on security and encouragement.  slow stride (see velocity) and every shortening stride with minimal step through on right.  heavily dependent of RW for support and can't/won't stand upright   Stairs            Wheelchair Mobility    Modified Rankin (Stroke Patients Only)       Balance Overall balance assessment: Needs assistance Sitting-balance support: No upper extremity supported Sitting balance-Leahy Scale: Fair     Standing balance support: Bilateral upper extremity supported;During functional activity Standing balance-Leahy Scale: Fair Standing balance comment: RW for support and able to stand with minimal support to attempt using urinal                            Cognition Arousal/Alertness: Awake/alert Behavior During Therapy: WFL for tasks assessed/performed Overall Cognitive Status: Within Functional Limits for tasks assessed                                 General Comments: almost child-like conversationalist, with limited answers to questions but friendly and cooperative      Exercises      General Comments        Pertinent Vitals/Pain Pain Assessment: 0-10 Pain Score: 4  Pain Location: L knee (4/10 pre-mob; 8/10 during mob) Pain Intervention(s): Limited activity within patient's tolerance;Monitored during session;Repositioned;Ice applied  Home Living                      Prior Function            PT Goals (current goals can now be found in the care plan section) Acute Rehab PT Goals Patient Stated Goal: rehab then home PT Goal Formulation: With patient Time For Goal Achievement: 01/26/17 Potential to Achieve Goals: Good Progress towards PT goals: Progressing toward goals;Goals met and updated - see care plan  (goals expired 4/3, updated 4/8)    Frequency    7X/week      PT Plan Current plan remains appropriate    Co-evaluation             End of Session Equipment Utilized During Treatment: Gait belt Activity Tolerance: Patient tolerated treatment well;Patient limited by fatigue Patient left: in chair;with call bell/phone within reach Nurse Communication: Mobility status PT Visit Diagnosis: Pain;Difficulty in walking, not elsewhere classified (R26.2) Pain - Right/Left: Left Pain - part of body: Knee     Time: 0211-1552 PT Time Calculation (min) (ACUTE ONLY): 45 min  Charges:  $Gait Training: 8-22 mins $Therapeutic Activity: 23-37 mins                    G Codes:          Herbie Drape 01/20/2017, 1:28 PM

## 2017-01-20 NOTE — Progress Notes (Signed)
Tolerating CPM Plan to dc to SNF Monday. Stable from ortho stand point

## 2017-01-20 NOTE — Progress Notes (Signed)
Orthopedic Tech Progress Note Patient Details:  Nicholas [name] [birth year] [id number]  Patient ID: [name], male   DOB: 09-Jul-1951, 66 y.o.   MRN: [id number] Pt wants to wait till after breakfast to get on cpm.  Karolee Stamps 01/20/2017, 6:23 AM

## 2017-01-21 DIAGNOSIS — R2689 Other abnormalities of gait and mobility: Secondary | ICD-10-CM | POA: Diagnosis not present

## 2017-01-21 DIAGNOSIS — Z4789 Encounter for other orthopedic aftercare: Secondary | ICD-10-CM | POA: Diagnosis not present

## 2017-01-21 DIAGNOSIS — M25562 Pain in left knee: Secondary | ICD-10-CM | POA: Diagnosis not present

## 2017-01-21 DIAGNOSIS — K219 Gastro-esophageal reflux disease without esophagitis: Secondary | ICD-10-CM | POA: Diagnosis not present

## 2017-01-21 DIAGNOSIS — G4700 Insomnia, unspecified: Secondary | ICD-10-CM | POA: Diagnosis not present

## 2017-01-21 DIAGNOSIS — E039 Hypothyroidism, unspecified: Secondary | ICD-10-CM | POA: Diagnosis not present

## 2017-01-21 DIAGNOSIS — Z96652 Presence of left artificial knee joint: Secondary | ICD-10-CM | POA: Diagnosis not present

## 2017-01-21 DIAGNOSIS — M1990 Unspecified osteoarthritis, unspecified site: Secondary | ICD-10-CM | POA: Diagnosis not present

## 2017-01-21 DIAGNOSIS — S8990XA Unspecified injury of unspecified lower leg, initial encounter: Secondary | ICD-10-CM | POA: Diagnosis not present

## 2017-01-21 DIAGNOSIS — M6281 Muscle weakness (generalized): Secondary | ICD-10-CM | POA: Diagnosis not present

## 2017-01-21 DIAGNOSIS — G8911 Acute pain due to trauma: Secondary | ICD-10-CM | POA: Diagnosis not present

## 2017-01-21 DIAGNOSIS — F339 Major depressive disorder, recurrent, unspecified: Secondary | ICD-10-CM | POA: Diagnosis not present

## 2017-01-21 DIAGNOSIS — M545 Low back pain: Secondary | ICD-10-CM | POA: Diagnosis not present

## 2017-01-21 DIAGNOSIS — R262 Difficulty in walking, not elsewhere classified: Secondary | ICD-10-CM | POA: Diagnosis not present

## 2017-01-21 DIAGNOSIS — G8929 Other chronic pain: Secondary | ICD-10-CM | POA: Diagnosis not present

## 2017-01-21 MED ORDER — HYDROCODONE-ACETAMINOPHEN 10-325 MG PO TABS: 1.0000 | ORAL_TABLET | ORAL | 0 refills | Status: DC | PRN

## 2017-01-21 MED ORDER — METHOCARBAMOL 500 MG PO TABS: 500.0000 mg | ORAL_TABLET | Freq: Four times a day (QID) | ORAL | 0 refills | Status: DC | PRN

## 2017-01-21 MED ORDER — RIVAROXABAN 10 MG PO TABS: 10.0000 mg | ORAL_TABLET | Freq: Every day | ORAL | 0 refills | Status: DC

## 2017-01-21 NOTE — Discharge Summary (Signed)
Physician Discharge Summary  Patient ID: Nicholas [name] MRN: [id number] DOB/AGE: 1951/10/14 66 y.o.  Admit date: 01/15/2017 Discharge date: 01/21/2017  Admission Diagnoses:  Active Problems:   Arthritis of knee   Discharge Diagnoses:  Same  Surgeries: Procedure(s): TOTAL KNEE ARTHROPLASTY on 01/15/2017   Consultants:   Discharged Condition: Stable  Hospital Course: Nicholas [name] is an 66 y.o. male who was admitted 01/15/2017 with a chief complaint of Left knee pain, and found to have a diagnosis of left knee arthritis.  They were brought to the operating room on 01/15/2017 and underwent the above named procedures.  Patient was started on CPM machine and physical therapy on postop day #1.  He was somewhat slow to mobilize possibly due to his previous right knee surgery.  He made good progress on the CPM machine.  He was placed on Xarelto for DVT prophylaxis.  Discharge to skilled nursing for continued rehabilitation.  He is discharged in good condition.  He will take Norco for pain and Robaxin for muscle relaxation  Antibiotics given:  Anti-infectives    Start     Dose/Rate Route Frequency Ordered Stop   01/15/17 1900  vancomycin (VANCOCIN) IVPB 1000 mg/200 mL premix     1,000 mg 200 mL/hr over 60 Minutes Intravenous Every 12 hours 01/15/17 1210 01/15/17 1946   01/15/17 0820  vancomycin (VANCOCIN) powder  Status:  Discontinued       As needed 01/15/17 0821 01/15/17 1030   01/15/17 0645  vancomycin (VANCOCIN) IVPB 1000 mg/200 mL premix  Status:  Discontinued     1,000 mg 200 mL/hr over 60 Minutes Intravenous  Once 01/15/17 0635 01/15/17 0643   01/15/17 0645  vancomycin (VANCOCIN) 1,500 mg in sodium chloride 0.9 % 500 mL IVPB     1,500 mg 250 mL/hr over 120 Minutes Intravenous To [facility] 01/15/17 0640 01/15/17 0700   01/15/17 0600  ceFAZolin (ANCEF) 3 g in dextrose 5 % 50 mL IVPB  Status:  Discontinued     3 g 130 mL/hr over 30 Minutes Intravenous On call to O.R. 01/14/17 1342  01/15/17 0635   01/15/17 0548  ceFAZolin (ANCEF) 3 g in dextrose 5 % 50 mL IVPB  Status:  Discontinued     3 g 130 mL/hr over 30 Minutes Intravenous On call to O.R. 01/15/17 6160 01/15/17 0550    .  Recent vital signs:  Vitals:   01/20/17 1500 01/20/17 2150  BP: (!) 141/86 (!) 150/73  Pulse: 99 98  Resp: 16   Temp: 98 F (36.7 C) 98.9 F (37.2 C)    Recent laboratory studies:  Results for orders placed or performed during the hospital encounter of 01/15/17  CBC  Result Value Ref Range   WBC 16.0 (H) 4.0 - 10.5 K/uL   RBC 4.33 4.22 - 5.81 MIL/uL   Hemoglobin 12.6 (L) 13.0 - 17.0 g/dL   HCT 39.1 39.0 - 52.0 %   MCV 90.3 78.0 - 100.0 fL   MCH 29.1 26.0 - 34.0 pg   MCHC 32.2 30.0 - 36.0 g/dL   RDW 13.8 11.5 - 15.5 %   Platelets 181 150 - 400 K/uL    Discharge Medications:   Allergies as of 01/21/2017      Reactions   Bee Venom Anaphylaxis   Respiratory Distress   Peanut-containing Drug Products Anaphylaxis, Dermatitis   Celebrex [celecoxib] Other (See Comments)   BLACK STOOL?MELENA?   Percocet [oxycodone-acetaminophen] Hives, Swelling   Latex Rash   Nsaids Other (  See Comments)   Disagrees with system      Medication List    STOP taking these medications   mupirocin ointment 2 % Commonly known as:  BACTROBAN   PROBIOTIC PO     TAKE these medications   amitriptyline 25 MG tablet Commonly known as:  ELAVIL Take 3 tablets (75 mg total) by mouth at bedtime.   EPIPEN 2-PAK 0.3 mg/0.3 mL Soaj injection Generic drug:  EPINEPHrine Inject 0.3 mg into the muscle as directed.   gabapentin 300 MG capsule Commonly known as:  NEURONTIN TAKE ONE CAPSULE BY MOUTH 4 TIMES A DAY   HYDROcodone-acetaminophen 10-325 MG tablet Commonly known as:  NORCO Take 1 tablet by mouth every 4 (four) hours as needed (breakthrough pain). What changed:  how much to take  how to take this  when to take this  reasons to take this  additional instructions   hydrocortisone 2.5 %  cream Apply 1 application topically at bedtime as needed (for rash).   IRON 27 240 (27 FE) MG tablet Generic drug:  ferrous gluconate Take 240 mg by mouth daily.   levothyroxine 25 MCG tablet Commonly known as:  SYNTHROID, LEVOTHROID Take 2 tablets (50 mcg total) by mouth daily before breakfast.   methocarbamol 500 MG tablet Commonly known as:  ROBAXIN Take 1 tablet (500 mg total) by mouth every 6 (six) hours as needed for muscle spasms.   multivitamin with minerals Tabs tablet Take 1 tablet by mouth daily. Centrum silver.   pantoprazole 40 MG tablet Commonly known as:  PROTONIX Take 1 tablet (40 mg total) by mouth daily before breakfast.   rivaroxaban 10 MG Tabs tablet Commonly known as:  XARELTO Take 1 tablet (10 mg total) by mouth daily with breakfast. Start taking on:  01/22/2017   SUMAtriptan 50 MG tablet Commonly known as:  IMITREX Take 1 tablet (50 mg total) by mouth once. May repeat in 2 hours if headache persists or recurs. What changed:  Another medication with the same name was removed. Continue taking this medication, and follow the directions you see here.       Diagnostic Studies: Dg Chest 2 View  Result Date: 01/04/2017 CLINICAL DATA:  Pre-admission for lefttotal knee replacement EXAM: CHEST  2 VIEW COMPARISON:  11/01/16 FINDINGS: Cardiomediastinal silhouette is stable. No infiltrate or pleural effusion. Stable mild streaky atelectasis or scarring lung bases. Mild degenerative changes mid thoracic spine. IMPRESSION: No active cardiopulmonary disease. Mild degenerative changes mid thoracic spine. Electronically Signed   By: Lahoma Crocker M.D.   On: 01/04/2017 10:37    Disposition: 01-Home or Self Care  Discharge Instructions    Call MD / Call 911    Complete by:  As directed    If you experience chest pain or shortness of breath, CALL 911 and be transported to the hospital emergency room.  If you develope a fever above 101 F, pus (white drainage) or increased  drainage or redness at the wound, or calf pain, call your surgeon's office.   Constipation Prevention    Complete by:  As directed    Drink plenty of fluids.  Prune juice may be helpful.  You may use a stool softener, such as Colace (over the counter) 100 mg twice a day.  Use MiraLax (over the counter) for constipation as needed.   Diet - low sodium heart healthy    Complete by:  As directed    Discharge instructions    Complete by:  As directed  CPM and physical therapy daily Okay to shower with dressing in place I will remove dressing on his clinic visit appointment next week   Increase activity slowly as tolerated    Complete by:  As directed       Follow-up Information    [facility] Follow up.   Specialty:  [place] Why:  A representative from [facility] will contact you to arrange start date and time for your therapy. Contact information: [address] [place] [address] [phone]            Signed: Anderson Malta 01/21/2017, 12:50 PM

## 2017-01-21 NOTE — Progress Notes (Signed)
qPhysical Therapy Treatment Patient Details Name: Nicholas [name] MRN: [id number] DOB: 28-Aug-1951 Today's Date: 01/21/2017    History of Present Illness pt is a 66 y/o male with h/o DDD OA, past R TKA with complication admitted with arthritic L knee which has failed conservative management, s/p LTKA.    PT Comments    Patient continues to require +2 assist for transitional movements. Pt reported feeling particularly fatigued today. Continue to progress as tolerated with anticipated d/c to SNF for further skilled PT services.     Follow Up Recommendations  SNF;Supervision/Assistance - 24 hour     Equipment Recommendations  3in1 (PT)    Recommendations for Other Services       Precautions / Restrictions Precautions Precautions: Fall Precaution Comments: extension fusion R knee Restrictions Weight Bearing Restrictions: Yes LLE Weight Bearing: Weight bearing as tolerated    Mobility  Bed Mobility Overal bed mobility: Needs Assistance Bed Mobility: Supine to Sit     Supine to sit: Mod assist;HOB elevated;Max assist     General bed mobility comments: max A to bring hips and bilat LE toward L side of bed and mod A to elevate trunk into sitting and bring hips to EOB;  use of bed rails and bed pad  Transfers Overall transfer level: Needs assistance Equipment used: Rolling walker (2 wheeled) Transfers: Sit to/from Stand Sit to Stand: Max assist;From elevated surface;+2 physical assistance         General transfer comment: assist to power up into standing, to stabilize RW, and for balance upon standing for transition of hand placement; cues for technique and hand placement as well as breathing technique to catch breath when returned to sitting  Ambulation/Gait Ambulation/Gait assistance: Min guard Ambulation Distance (Feet): 100 Feet Assistive device: Rolling walker (2 wheeled) Gait Pattern/deviations: Step-to pattern;Decreased stride length;Antalgic     General Gait  Details: pt reported feeling very fatigued this session and with pallor however no c/o dizziness/nausea; cues for posture and breating as pt tends to take very shallow breaths with mobility   Stairs            Wheelchair Mobility    Modified Rankin (Stroke Patients Only)       Balance Overall balance assessment: Needs assistance Sitting-balance support: No upper extremity supported Sitting balance-Leahy Scale: Fair     Standing balance support: Bilateral upper extremity supported;During functional activity Standing balance-Leahy Scale: Fair Standing balance comment: RW for support and able to stand with minimal support to attempt using urinal                            Cognition Arousal/Alertness: Awake/alert Behavior During Therapy: WFL for tasks assessed/performed Overall Cognitive Status: Within Functional Limits for tasks assessed                                        Exercises      General Comments General comments (skin integrity, edema, etc.): BP end of session 150/88, SpO2 96% on RA and HR 126      Pertinent Vitals/Pain Pain Assessment: Faces Faces Pain Scale: Hurts whole lot Pain Location: L knee with flexion (4/10 pre-mob; 8/10 during mob) Pain Descriptors / Indicators: Grimacing;Guarding;Moaning;Sharp Pain Intervention(s): Limited activity within patient's tolerance;Monitored during session;Repositioned;Patient requesting pain meds-RN notified    Home Living  Prior Function            PT Goals (current goals can now be found in the care plan section) Acute Rehab PT Goals PT Goal Formulation: With patient Time For Goal Achievement: 01/26/17 Potential to Achieve Goals: Good Progress towards PT goals: Progressing toward goals    Frequency    7X/week      PT Plan Current plan remains appropriate    Co-evaluation             End of Session Equipment Utilized During Treatment:  Gait belt Activity Tolerance: Patient limited by fatigue;Patient limited by pain Patient left: in chair;with call bell/phone within reach Nurse Communication: Mobility status PT Visit Diagnosis: Pain;Difficulty in walking, not elsewhere classified (R26.2) Pain - Right/Left: Left Pain - part of body: Knee     Time: 1050-1131 PT Time Calculation (min) (ACUTE ONLY): 41 min  Charges:  $Gait Training: 8-22 mins $Therapeutic Activity: 23-37 mins                    G Codes:       Earney Navy, PTA Pager: [phone]     Darliss Cheney 01/21/2017, 2:05 PM

## 2017-01-21 NOTE — Progress Notes (Signed)
Stopped by to visit w/ pt, but he was involved in medical ministrations. Chaplain available for f/u.   01/21/17 1600  Clinical Encounter Type  Visited With Patient not available  Visit Type Initial;Psychological support;Spiritual support;Social support  Referral From [place] [name], Chaplain

## 2017-01-21 NOTE — Progress Notes (Signed)
Subjective: Pt stable - pain ok   Objective: Vital signs in last 24 hours: Temp:  [98 F (36.7 C)-98.9 F (37.2 C)] 98.9 F (37.2 C) (04/08 2150) Pulse Rate:  [98-99] 98 (04/08 2150) Resp:  [16] 16 (04/08 1500) BP: (141-150)/(73-86) 150/73 (04/08 2150) SpO2:  [97 %] 97 % (04/08 2150)  Intake/Output from previous day: 04/08 0701 - 04/09 0700 In: 1180 [P.O.:1180] Out: 2100 [Urine:2100] Intake/Output this shift: Total I/O In: 240 [P.O.:240] Out: 400 [Urine:400]  Exam:  Dorsiflexion/Plantar flexion intact  Labs: No results for input(s): HGB in the last 72 hours. No results for input(s): WBC, RBC, HCT, PLT in the last 72 hours. No results for input(s): NA, K, CL, CO2, BUN, CREATININE, GLUCOSE, CALCIUM in the last 72 hours. No results for input(s): LABPT, INR in the last 72 hours.  Assessment/Plan: Plan snf summary done - ready   Anderson Malta 01/21/2017, 12:31 PM

## 2017-01-21 NOTE — Clinical Social Work Note (Signed)
Clinical Social Worker facilitated patient discharge including contacting patient family and facility to confirm patient discharge plans.  Clinical information faxed to facility and family agreeable with plan.  CSW arranged ambulance transport via [facility] to [facility] .  RN to call [phone]  for report prior to discharge. Patient will go to room 125A.   Clinical Social Worker will sign off for now as social work intervention is no longer needed. Please consult [place] again if new need arises.  [address], [place]

## 2017-01-21 NOTE — Progress Notes (Signed)
Patient to be discharged to [facility]. IV removed. Patient is waiting for [facility] to pick him up. Nurse called and give report to Nat Christen

## 2017-01-22 DIAGNOSIS — M6281 Muscle weakness (generalized): Secondary | ICD-10-CM | POA: Diagnosis not present

## 2017-01-22 DIAGNOSIS — R262 Difficulty in walking, not elsewhere classified: Secondary | ICD-10-CM | POA: Diagnosis not present

## 2017-01-22 DIAGNOSIS — M25562 Pain in left knee: Secondary | ICD-10-CM | POA: Diagnosis not present

## 2017-01-28 ENCOUNTER — Inpatient Hospital Stay (INDEPENDENT_AMBULATORY_CARE_PROVIDER_SITE_OTHER): Payer: PPO | Admitting: Orthopedic Surgery

## 2017-01-30 ENCOUNTER — Ambulatory Visit (INDEPENDENT_AMBULATORY_CARE_PROVIDER_SITE_OTHER): Payer: PPO | Admitting: Orthopedic Surgery

## 2017-01-30 ENCOUNTER — Encounter (INDEPENDENT_AMBULATORY_CARE_PROVIDER_SITE_OTHER): Payer: Self-pay | Admitting: Orthopedic Surgery

## 2017-01-30 ENCOUNTER — Ambulatory Visit (INDEPENDENT_AMBULATORY_CARE_PROVIDER_SITE_OTHER): Payer: PPO

## 2017-01-30 DIAGNOSIS — Z96652 Presence of left artificial knee joint: Secondary | ICD-10-CM | POA: Diagnosis not present

## 2017-02-01 DIAGNOSIS — F339 Major depressive disorder, recurrent, unspecified: Secondary | ICD-10-CM | POA: Diagnosis not present

## 2017-02-01 DIAGNOSIS — M545 Low back pain: Secondary | ICD-10-CM | POA: Diagnosis not present

## 2017-02-01 DIAGNOSIS — G8929 Other chronic pain: Secondary | ICD-10-CM | POA: Diagnosis not present

## 2017-02-01 DIAGNOSIS — G4700 Insomnia, unspecified: Secondary | ICD-10-CM | POA: Diagnosis not present

## 2017-02-01 NOTE — Progress Notes (Signed)
Post-Op Visit Note   Patient: Nicholas [name]           Date of Birth: 10-29-1950           MRN: [id number] Visit Date: 01/30/2017 PCP: [name], MD   Assessment & Plan:  Chief Complaint:  Chief Complaint  Patient presents with  . Left Knee - Routine Post Op   Visit Diagnoses:  1. S/P total knee arthroplasty, left     Plan is for Nicholas [name] to continue with physical therapy.  He is doing well with his left total knee replacement.  Flexion is 0-93.  He is at physical therapy in a skilled nursing facility.  Underwriter prescription for outpatient physical therapy 2 times a week for 6 weeks for right knee range of motion and strengthening.  I'll see him back in 4 weeks for clinical recheck  Follow-Up Instructions: No Follow-up on file.   Orders:  Orders Placed This Encounter  Procedures  . XR Knee 1-2 Views Left  . Ambulatory referral to Physical Therapy   No orders of the defined types were placed in this encounter.   Imaging: No results found.  PMFS History: Patient Active Problem List   Diagnosis Date Noted  . S/P total knee arthroplasty, left 01/30/2017  . Arthritis of knee 01/15/2017  . Skin rash 12/10/2016  . Acute bronchitis 11/07/2016  . Baker's cyst of knee, left 11/07/2016  . Medicare annual wellness visit, initial 10/05/2016  . Advanced care planning/counseling discussion 10/05/2016  . Nasal sore 10/05/2016  . Diarrhea 08/21/2016  . Obesity, Class II, BMI 35-39.9, with comorbidity 07/19/2016  . Hypothyroidism   . IDA (iron deficiency anemia) 04/23/2016  . Chronic pain syndrome 04/23/2016  . Inflammatory arthritis 04/23/2016  . Chronic leg pain 04/23/2016  . Lesion of palate 04/23/2016  . Grieving 04/23/2016  . GERD (gastroesophageal reflux disease)   . Duodenitis determined by biopsy 02/13/2016  . Diverticulosis of colon without hemorrhage 01/31/2016  . Migraine 01/11/2012  . MRSA (methicillin resistant Staphylococcus aureus) infection  07/04/2010  . DEGENERATIVE DISC DISEASE 07/04/2010  . History of total right knee replacement 07/04/2010  . Osteoarthritis 06/09/2007  . Personal history of colonic adenoma 06/01/2003   Past Medical History:  Diagnosis Date  . Acute kidney failure 08/2008   "cleared up"  no problems since  . Allergy   . Anxiety   . Arthritis   . Asthma    ?of this no inhaler  . Blood transfusion   . CHF (congestive heart failure) ([place])    ?of this, pt denies  . CLOSTRIDIUM DIFFICILE COLITIS 07/04/2010   Annotation: 12/09, 2/10 Qualifier: Diagnosis of  By: [name], [name]    . Depression   . Depression with anxiety   . Duodenitis determined by biopsy 02/2016   peptic likely due to aleve (erosive gastropathy with duodenal erosions)  . ECZEMA 07/04/2010   Qualifier: Diagnosis of  By: [name], [name]    . GERD (gastroesophageal reflux disease)   . Heart attack ([place])    08/2008 (likely demand ischemia in the setting of MSSA sepsis/R TKA infection)  . History of hiatal hernia   . History of stomach ulcers   . Hypothyroidism   . Lower GI bleeding   . MALAR AND MAXILLARY BONES CLOSED FRACTURE 07/04/2010   Annotation: ORIF Qualifier: Diagnosis of  By: [name], [name]    . Migraine    "definitely"  . MRSA (methicillin resistant Staphylococcus aureus)    in leg,  had to place steel rod in leg  . Personal history of colonic adenoma 06/01/2003  . PFO (patent foramen ovale)    small PFO by 08/2008 TEE  . Pneumonia 08/2008   "while in ICU"  . Seizures ([place]) 2009   "long time ago"    . Stroke [facility]) 2010   unable to complete sentences at times    Family History  Problem Relation Age of Onset  . Coronary artery disease Father   . Heart disease Mother   . Breast cancer Sister   . Diabetes Brother   . Diabetes Sister   . Colon cancer Neg Hx   . Stomach cancer Neg Hx     Past Surgical History:  Procedure Laterality Date  . anterior  nerve transposition  07/2009   left ulnar nerve  .  antibiotic spacer exchange  11/2008; 08/2006   right knee  . ARTHROTOMY  08/2008   right knee w/I&D  . CATARACT EXTRACTION    . COLONOSCOPY  11/2012   diverticulosis, rpt 10 yrs [name])  . ESOPHAGOGASTRODUODENOSCOPY  02/2016   erosive gastropathy with duodenal erosions [name])  . HARDWARE REMOVAL  04/2006   right knee w/antibiotic spacers placed  . INGUINAL HERNIA REPAIR  early 1990's   bilateral  . JOINT REPLACEMENT Right    rt knee  . KNEE ARTHROSCOPY  10/2001   right  . KNEE FUSION  03/2009   right knee removal; antibiotic spacers;   . REPLACEMENT TOTAL KNEE  08/2008; 09/2001   right  . rod placement Right   . SYNOVECTOMY  06/2005   debridement, liner exchange right knee  . TOE AMPUTATION Left 08/2008   great toe  . TOE AMPUTATION Left 04/2016   2nd toe [name]  . TONSILLECTOMY    . TOTAL KNEE ARTHROPLASTY Left 01/15/2017  . TOTAL KNEE ARTHROPLASTY Left 01/15/2017   Procedure: TOTAL KNEE ARTHROPLASTY;  Surgeon: Meredith Pel, MD;  Location: [facility];  Service: Orthopedics;  Laterality: Left;   Social History   Occupational History  . retired    Social History [name] Topics  . Smoking status: Never Smoker  . Smokeless tobacco: Never Used  . Alcohol use No     Comment: "I abused alcohol; last drink  ~ 2005"  . Drug use: No  . Sexual activity: No

## 2017-02-08 ENCOUNTER — Telehealth (INDEPENDENT_AMBULATORY_CARE_PROVIDER_SITE_OTHER): Payer: Self-pay | Admitting: Orthopedic Surgery

## 2017-02-08 DIAGNOSIS — M545 Low back pain: Secondary | ICD-10-CM | POA: Diagnosis not present

## 2017-02-08 DIAGNOSIS — M1990 Unspecified osteoarthritis, unspecified site: Secondary | ICD-10-CM | POA: Diagnosis not present

## 2017-02-08 DIAGNOSIS — G8929 Other chronic pain: Secondary | ICD-10-CM | POA: Diagnosis not present

## 2017-02-08 DIAGNOSIS — Z4789 Encounter for other orthopedic aftercare: Secondary | ICD-10-CM | POA: Diagnosis not present

## 2017-02-08 NOTE — Telephone Encounter (Signed)
Pt requested a call back. He stated he has a couple of questions to ask you please.  [phone]

## 2017-02-11 NOTE — Telephone Encounter (Signed)
IC pt. He was questioning when he should begin outpatient physical therapy. I advised as soon as he left facility ok to begin. He asked if he could begin driving. I advised not a good idea until he is completely off pain medications.

## 2017-02-11 NOTE — Telephone Encounter (Signed)
Pt called again requesting a call back as soon as possible please.

## 2017-02-14 ENCOUNTER — Ambulatory Visit: Payer: PPO | Attending: Orthopedic Surgery | Admitting: Physical Therapy

## 2017-02-14 DIAGNOSIS — M25562 Pain in left knee: Secondary | ICD-10-CM | POA: Insufficient documentation

## 2017-02-14 DIAGNOSIS — M6281 Muscle weakness (generalized): Secondary | ICD-10-CM | POA: Diagnosis not present

## 2017-02-14 DIAGNOSIS — M25661 Stiffness of right knee, not elsewhere classified: Secondary | ICD-10-CM | POA: Insufficient documentation

## 2017-02-14 DIAGNOSIS — R262 Difficulty in walking, not elsewhere classified: Secondary | ICD-10-CM | POA: Diagnosis not present

## 2017-02-14 NOTE — Patient Instructions (Signed)
  Sent to pt via text message due to printer being offline.

## 2017-02-14 NOTE — Therapy (Signed)
[place], [place], [address] Phone: [phone]   Fax:  [phone]  Physical Therapy Evaluation  Patient Details  Name: Nicholas [name] MRN: [id number] Date of Birth: 05-29-1951 Referring Provider: Meredith Pel, MD  Encounter Date: 02/14/2017      PT End of Session - 02/14/17 1011    Visit Number 1   Number of Visits 9   Date for PT Re-Evaluation 03/15/17   Authorization Type MCR advantage   PT Start Time 1010   PT Stop Time 1059   PT Time Calculation (min) 49 min   Activity Tolerance Patient tolerated treatment well   Behavior During Therapy [facility] for tasks assessed/performed      Past Medical History:  Diagnosis Date  . Acute kidney failure 08/2008   "cleared up"  no problems since  . Allergy   . Anxiety   . Arthritis   . Asthma    ?of this no inhaler  . Blood transfusion   . CHF (congestive heart failure) ([place])    ?of this, pt denies  . CLOSTRIDIUM DIFFICILE COLITIS 07/04/2010   Annotation: 12/09, 2/10 Qualifier: Diagnosis of  By: [name], [name]    . Depression   . Depression with anxiety   . Duodenitis determined by biopsy 02/2016   peptic likely due to aleve (erosive gastropathy with duodenal erosions)  . ECZEMA 07/04/2010   Qualifier: Diagnosis of  By: [name], [name]    . GERD (gastroesophageal reflux disease)   . Heart attack ([place])    08/2008 (likely demand ischemia in the setting of MSSA sepsis/R TKA infection)  . History of hiatal hernia   . History of stomach ulcers   . Hypothyroidism   . Lower GI bleeding   . MALAR AND MAXILLARY BONES CLOSED FRACTURE 07/04/2010   Annotation: ORIF Qualifier: Diagnosis of  By: [name], [name]    . Migraine    "definitely"  . MRSA (methicillin resistant Staphylococcus aureus)    in leg, had to place steel rod in leg  . Personal history of colonic adenoma 06/01/2003  . PFO (patent foramen ovale)    small PFO by 08/2008 TEE  .  Pneumonia 08/2008   "while in ICU"  . Seizures ([place]) 2009   "long time ago"    . Stroke [facility]) 2010   unable to complete sentences at times    Past Surgical History:  Procedure Laterality Date  . anterior  nerve transposition  07/2009   left ulnar nerve  . antibiotic spacer exchange  11/2008; 08/2006   right knee  . ARTHROTOMY  08/2008   right knee w/I&D  . CATARACT EXTRACTION    . COLONOSCOPY  11/2012   diverticulosis, rpt 10 yrs [name])  . ESOPHAGOGASTRODUODENOSCOPY  02/2016   erosive gastropathy with duodenal erosions [name])  . HARDWARE REMOVAL  04/2006   right knee w/antibiotic spacers placed  . INGUINAL HERNIA REPAIR  early 1990's   bilateral  . JOINT REPLACEMENT Right    rt knee  . KNEE ARTHROSCOPY  10/2001   right  . KNEE FUSION  03/2009   right knee removal; antibiotic spacers;   . REPLACEMENT TOTAL KNEE  08/2008; 09/2001   right  . rod placement Right   . SYNOVECTOMY  06/2005   debridement, liner exchange right knee  . TOE AMPUTATION Left 08/2008   great toe  . TOE AMPUTATION Left 04/2016   2nd toe [name]  . TONSILLECTOMY    .  [place], [place], [address] Phone: [phone]   Fax:  [phone]  Physical Therapy Evaluation  Patient Details  Name: Nicholas [name] MRN: [id number] Date of Birth: 05-29-1951 Referring Provider: Meredith Pel, MD  Encounter Date: 02/14/2017      PT End of Session - 02/14/17 1011    Visit Number 1   Number of Visits 9   Date for PT Re-Evaluation 03/15/17   Authorization Type MCR advantage   PT Start Time 1010   PT Stop Time 1059   PT Time Calculation (min) 49 min   Activity Tolerance Patient tolerated treatment well   Behavior During Therapy [facility] for tasks assessed/performed      Past Medical History:  Diagnosis Date  . Acute kidney failure 08/2008   "cleared up"  no problems since  . Allergy   . Anxiety   . Arthritis   . Asthma    ?of this no inhaler  . Blood transfusion   . CHF (congestive heart failure) ([place])    ?of this, pt denies  . CLOSTRIDIUM DIFFICILE COLITIS 07/04/2010   Annotation: 12/09, 2/10 Qualifier: Diagnosis of  By: [name], [name]    . Depression   . Depression with anxiety   . Duodenitis determined by biopsy 02/2016   peptic likely due to aleve (erosive gastropathy with duodenal erosions)  . ECZEMA 07/04/2010   Qualifier: Diagnosis of  By: [name], [name]    . GERD (gastroesophageal reflux disease)   . Heart attack ([place])    08/2008 (likely demand ischemia in the setting of MSSA sepsis/R TKA infection)  . History of hiatal hernia   . History of stomach ulcers   . Hypothyroidism   . Lower GI bleeding   . MALAR AND MAXILLARY BONES CLOSED FRACTURE 07/04/2010   Annotation: ORIF Qualifier: Diagnosis of  By: [name], [name]    . Migraine    "definitely"  . MRSA (methicillin resistant Staphylococcus aureus)    in leg, had to place steel rod in leg  . Personal history of colonic adenoma 06/01/2003  . PFO (patent foramen ovale)    small PFO by 08/2008 TEE  .  Pneumonia 08/2008   "while in ICU"  . Seizures ([place]) 2009   "long time ago"    . Stroke [facility]) 2010   unable to complete sentences at times    Past Surgical History:  Procedure Laterality Date  . anterior  nerve transposition  07/2009   left ulnar nerve  . antibiotic spacer exchange  11/2008; 08/2006   right knee  . ARTHROTOMY  08/2008   right knee w/I&D  . CATARACT EXTRACTION    . COLONOSCOPY  11/2012   diverticulosis, rpt 10 yrs [name])  . ESOPHAGOGASTRODUODENOSCOPY  02/2016   erosive gastropathy with duodenal erosions [name])  . HARDWARE REMOVAL  04/2006   right knee w/antibiotic spacers placed  . INGUINAL HERNIA REPAIR  early 1990's   bilateral  . JOINT REPLACEMENT Right    rt knee  . KNEE ARTHROSCOPY  10/2001   right  . KNEE FUSION  03/2009   right knee removal; antibiotic spacers;   . REPLACEMENT TOTAL KNEE  08/2008; 09/2001   right  . rod placement Right   . SYNOVECTOMY  06/2005   debridement, liner exchange right knee  . TOE AMPUTATION Left 08/2008   great toe  . TOE AMPUTATION Left 04/2016   2nd toe [name]  . TONSILLECTOMY    .  Muscle weakness (generalized)  Stiffness of right knee, not elsewhere classified      G-Codes - 03-08-2017 1311    Functional Assessment Tool Used (Outpatient Only) FOTO 46% ability (goal 60%), clinical judgement   Functional Limitation Mobility: Walking and moving around   Mobility: Walking and Moving Around Current Status (T6226) At least 40 percent but less than 60 percent impaired, limited or restricted   Mobility: Walking and Moving Around Goal Status [phone]) At least 60 percent but less than 80 percent impaired,  limited or restricted       Problem List Patient Active Problem List   Diagnosis Date Noted  . S/P total knee arthroplasty, left 01/30/2017  . Arthritis of knee 01/15/2017  . Skin rash 12/10/2016  . Acute bronchitis 11/07/2016  . Baker's cyst of knee, left 11/07/2016  . Medicare annual wellness visit, initial 10/05/2016  . Advanced care planning/counseling discussion 10/05/2016  . Nasal sore 10/05/2016  . Diarrhea 08/21/2016  . Obesity, Class II, BMI 35-39.9, with comorbidity 07/19/2016  . Hypothyroidism   . IDA (iron deficiency anemia) 04/23/2016  . Chronic pain syndrome 04/23/2016  . Inflammatory arthritis 04/23/2016  . Chronic leg pain 04/23/2016  . Lesion of palate 04/23/2016  . Grieving 04/23/2016  . GERD (gastroesophageal reflux disease)   . Duodenitis determined by biopsy 02/13/2016  . Diverticulosis of colon without hemorrhage 01/31/2016  . Migraine 01/11/2012  . MRSA (methicillin resistant Staphylococcus aureus) infection 07/04/2010  . DEGENERATIVE DISC DISEASE 07/04/2010  . History of total right knee replacement 07/04/2010  . Osteoarthritis 06/09/2007  . Personal history of colonic adenoma 06/01/2003   Emilyanne Mcgough C. Reka Wist PT, DPT 03/08/17 1:13 PM   [facility] [address] [place], [place], [address] Phone: [phone]   Fax:  [phone]  Name: Nicholas [name] MRN: [id number] Date of Birth: 31-Dec-1950  Muscle weakness (generalized)  Stiffness of right knee, not elsewhere classified      G-Codes - 03-08-2017 1311    Functional Assessment Tool Used (Outpatient Only) FOTO 46% ability (goal 60%), clinical judgement   Functional Limitation Mobility: Walking and moving around   Mobility: Walking and Moving Around Current Status (T6226) At least 40 percent but less than 60 percent impaired, limited or restricted   Mobility: Walking and Moving Around Goal Status [phone]) At least 60 percent but less than 80 percent impaired,  limited or restricted       Problem List Patient Active Problem List   Diagnosis Date Noted  . S/P total knee arthroplasty, left 01/30/2017  . Arthritis of knee 01/15/2017  . Skin rash 12/10/2016  . Acute bronchitis 11/07/2016  . Baker's cyst of knee, left 11/07/2016  . Medicare annual wellness visit, initial 10/05/2016  . Advanced care planning/counseling discussion 10/05/2016  . Nasal sore 10/05/2016  . Diarrhea 08/21/2016  . Obesity, Class II, BMI 35-39.9, with comorbidity 07/19/2016  . Hypothyroidism   . IDA (iron deficiency anemia) 04/23/2016  . Chronic pain syndrome 04/23/2016  . Inflammatory arthritis 04/23/2016  . Chronic leg pain 04/23/2016  . Lesion of palate 04/23/2016  . Grieving 04/23/2016  . GERD (gastroesophageal reflux disease)   . Duodenitis determined by biopsy 02/13/2016  . Diverticulosis of colon without hemorrhage 01/31/2016  . Migraine 01/11/2012  . MRSA (methicillin resistant Staphylococcus aureus) infection 07/04/2010  . DEGENERATIVE DISC DISEASE 07/04/2010  . History of total right knee replacement 07/04/2010  . Osteoarthritis 06/09/2007  . Personal history of colonic adenoma 06/01/2003   Emilyanne Mcgough C. Reka Wist PT, DPT 03/08/17 1:13 PM   [facility] [address] [place], [place], [address] Phone: [phone]   Fax:  [phone]  Name: Nicholas [name] MRN: [id number] Date of Birth: 31-Dec-1950

## 2017-02-15 ENCOUNTER — Other Ambulatory Visit (INDEPENDENT_AMBULATORY_CARE_PROVIDER_SITE_OTHER): Payer: Self-pay | Admitting: Orthopedic Surgery

## 2017-02-19 ENCOUNTER — Ambulatory Visit: Payer: PPO

## 2017-02-19 DIAGNOSIS — M25562 Pain in left knee: Secondary | ICD-10-CM

## 2017-02-19 DIAGNOSIS — M6281 Muscle weakness (generalized): Secondary | ICD-10-CM

## 2017-02-19 DIAGNOSIS — M25661 Stiffness of right knee, not elsewhere classified: Secondary | ICD-10-CM

## 2017-02-19 DIAGNOSIS — R262 Difficulty in walking, not elsewhere classified: Secondary | ICD-10-CM

## 2017-02-19 NOTE — Therapy (Signed)
[place], [place], [address] Phone: [phone]   Fax:  [phone]  Physical Therapy Treatment  Patient Details  Name: Nicholas [name] MRN: [id number] Date of Birth: Jan 31, 1951 Referring Provider: Meredith Pel, MD  Encounter Date: 02/19/2017      PT End of Session - 02/19/17 1016    Visit Number 2   Number of Visits 9   Date for PT Re-Evaluation 03/15/17   Authorization Type MCR advantage   PT Start Time 1015   PT Stop Time 1108   PT Time Calculation (min) 53 min   Activity Tolerance Patient tolerated treatment well   Behavior During Therapy [facility] for tasks assessed/performed      Past Medical History:  Diagnosis Date  . Acute kidney failure 08/2008   "cleared up"  no problems since  . Allergy   . Anxiety   . Arthritis   . Asthma    ?of this no inhaler  . Blood transfusion   . CHF (congestive heart failure) ([place])    ?of this, pt denies  . CLOSTRIDIUM DIFFICILE COLITIS 07/04/2010   Annotation: 12/09, 2/10 Qualifier: Diagnosis of  By: [name], [name]    . Depression   . Depression with anxiety   . Duodenitis determined by biopsy 02/2016   peptic likely due to aleve (erosive gastropathy with duodenal erosions)  . ECZEMA 07/04/2010   Qualifier: Diagnosis of  By: [name], [name]    . GERD (gastroesophageal reflux disease)   . Heart attack ([place])    08/2008 (likely demand ischemia in the setting of MSSA sepsis/R TKA infection)  . History of hiatal hernia   . History of stomach ulcers   . Hypothyroidism   . Lower GI bleeding   . MALAR AND MAXILLARY BONES CLOSED FRACTURE 07/04/2010   Annotation: ORIF Qualifier: Diagnosis of  By: [name], [name]    . Migraine    "definitely"  . MRSA (methicillin resistant Staphylococcus aureus)    in leg, had to place steel rod in leg  . Personal history of colonic adenoma 06/01/2003  . PFO (patent foramen ovale)    small PFO by 08/2008 TEE  . Pneumonia  08/2008   "while in ICU"  . Seizures ([place]) 2009   "long time ago"    . Stroke [facility]) 2010   unable to complete sentences at times    Past Surgical History:  Procedure Laterality Date  . anterior  nerve transposition  07/2009   left ulnar nerve  . antibiotic spacer exchange  11/2008; 08/2006   right knee  . ARTHROTOMY  08/2008   right knee w/I&D  . CATARACT EXTRACTION    . COLONOSCOPY  11/2012   diverticulosis, rpt 10 yrs [name])  . ESOPHAGOGASTRODUODENOSCOPY  02/2016   erosive gastropathy with duodenal erosions [name])  . HARDWARE REMOVAL  04/2006   right knee w/antibiotic spacers placed  . INGUINAL HERNIA REPAIR  early 1990's   bilateral  . JOINT REPLACEMENT Right    rt knee  . KNEE ARTHROSCOPY  10/2001   right  . KNEE FUSION  03/2009   right knee removal; antibiotic spacers;   . REPLACEMENT TOTAL KNEE  08/2008; 09/2001   right  . rod placement Right   . SYNOVECTOMY  06/2005   debridement, liner exchange right knee  . TOE AMPUTATION Left 08/2008   great toe  . TOE AMPUTATION Left 04/2016   2nd toe [name]  . TONSILLECTOMY    .  TOTAL KNEE ARTHROPLASTY Left 01/15/2017  . TOTAL KNEE ARTHROPLASTY Left 01/15/2017   Procedure: TOTAL KNEE ARTHROPLASTY;  Surgeon: Meredith Pel, MD;  Location: [place];  Service: Orthopedics;  Laterality: Left;    There were no vitals filed for this visit.      Subjective Assessment - 02/19/17 1015    Subjective Medial LT knee pain    Currently in Pain? Yes   Pain Score 3    Pain Location Knee   Pain Orientation Left;Medial   Pain Descriptors / Indicators Aching   Pain Type Surgical pain   Pain Onset More than a month ago   Pain Frequency Intermittent   Aggravating Factors  activity   Pain Relieving Factors meds , cold   Multiple Pain Sites No            [facility] PT Assessment - 02/19/17 0001      ROM / Strength   AROM / PROM / Strength AROM     AROM   AROM Assessment Site Knee   Right/Left Knee Left   Left Knee Extension  0   Left Knee Flexion 112                     [facility] Adult PT Treatment/Exercise - 02/19/17 0001      Ambulation/Gait   Ambulation Distance (Feet) 300 Feet   Assistive device Straight cane   Gait Pattern Step-through pattern   Ambulation Surface Level;Indoor   Stairs Yes   Stairs Assistance 6: Modified independent (Device/Increase time)   Stair Management Technique One rail Right   Number of Stairs 12   Height of Stairs 6   Gait Comments descends sideway due to decreased ROM LT knee     Knee/Hip Exercises: Stretches   Knee: Self-Stretch to increase Flexion Left;60 seconds;5 reps   Knee: Self-Stretch Limitations supine with knee to chest gravity assisted     Knee/Hip Exercises: Standing   Other Standing Knee Exercises standing glut sets with reach as able t at wall . Shoulder stiffness limits range so did  reps with hands on wall with RT leg lift and PF on Lt  single leg standing .  x 10-12 rps each     Knee/Hip Exercises: Seated   Long Arc Quad Left;20 reps   Long Arc Quad Weight 5 lbs.     Knee/Hip Exercises: Supine   Single Leg Bridge Left  8 reps RT leg 80 degrees at hip   Straight Leg Raises Left;20 reps     Knee/Hip Exercises: Sidelying   Hip ABduction Left;15 reps                PT Education - 02/19/17 1050    Education provided Yes   Education Details flexion stretching wiht gravity in supine , benefits of stretching at home regular basis   Person(s) Educated Patient   Methods Explanation;Demonstration;Handout;Verbal cues   Comprehension Verbalized understanding;Returned demonstration          PT Short Term Goals - 02/14/17 1308      PT SHORT TERM GOAL #1   Title 0-120 deg ROM to allow for necessary ROM to stand from std height chairs and toilets by 6/1   Baseline see flowsheet   Time 4   Period Weeks   Status New     PT SHORT TERM GOAL #2   Title hip abduction and extension to 4+/5 to indicate proper proximal support for distal  control of knee joint  Baseline see flowsheet   Time 4   Period Weeks   Status New     PT SHORT TERM GOAL #3   Title FOTO to 70% ability to indicate significant improvement in functional ability   Baseline 46% at eval   Time 4   Period Weeks   Status New     PT SHORT TERM GOAL #4   Title Pt will be able to safely and confidently ambulate community distances without use of walker   Baseline uses walker outside of home at eval   Time 4   Period Weeks   Status New                  Plan - 02/19/17 1053    Clinical Impression Statement Improved ROM flexion , no incr pain. Gait with SPC with good balance and stability ,needs to practice at thome.      PT Treatment/Interventions ADLs/Self Care Home Management;Cryotherapy;Electrical Stimulation;Functional mobility training;Stair training;Gait training;Ultrasound;Moist Heat;Therapeutic activities;Therapeutic exercise;Balance training;Neuromuscular re-education;Patient/family education;Passive range of motion;Scar mobilization;Manual techniques;Dry needling;Taping;Vasopneumatic Device   PT Next Visit Plan flexion & extension stretch, gait without AD/SPC as appropriate, Strength   PT Home Exercise Plan standing glut sets, sidelying hip abduction, heel slide, gravity knee flexion   Consulted and Agree with Plan of Care Patient      Patient will benefit from skilled therapeutic intervention in order to improve the following deficits and impairments:  Abnormal gait, Decreased range of motion, Difficulty walking, Decreased activity tolerance, Pain, Improper body mechanics, Decreased balance, Decreased strength  Visit Diagnosis: Acute pain of left knee  Difficulty in walking, not elsewhere classified  Muscle weakness (generalized)  Stiffness of right knee, not elsewhere classified     Problem List Patient Active Problem List   Diagnosis Date Noted  . S/P total knee arthroplasty, left 01/30/2017  . Arthritis of knee  01/15/2017  . Skin rash 12/10/2016  . Acute bronchitis 11/07/2016  . Baker's cyst of knee, left 11/07/2016  . Medicare annual wellness visit, initial 10/05/2016  . Advanced care planning/counseling discussion 10/05/2016  . Nasal sore 10/05/2016  . Diarrhea 08/21/2016  . Obesity, Class II, BMI 35-39.9, with comorbidity 07/19/2016  . Hypothyroidism   . IDA (iron deficiency anemia) 04/23/2016  . Chronic pain syndrome 04/23/2016  . Inflammatory arthritis 04/23/2016  . Chronic leg pain 04/23/2016  . Lesion of palate 04/23/2016  . Grieving 04/23/2016  . GERD (gastroesophageal reflux disease)   . Duodenitis determined by biopsy 02/13/2016  . Diverticulosis of colon without hemorrhage 01/31/2016  . Migraine 01/11/2012  . MRSA (methicillin resistant Staphylococcus aureus) infection 07/04/2010  . DEGENERATIVE DISC DISEASE 07/04/2010  . History of total right knee replacement 07/04/2010  . Osteoarthritis 06/09/2007  . Personal history of colonic adenoma 06/01/2003    Darrel Hoover  PT 02/19/2017, 11:02 AM  [facility] [address] [place], [place], [address] Phone: [phone]   Fax:  [phone]  Name: Nicholas [name] MRN: [id number] Date of Birth: 07/23/1951

## 2017-02-19 NOTE — Patient Instructions (Signed)
Knee to chest with gravity flexion stretching 30-60 sec 2-3 reps 3x /day

## 2017-02-20 ENCOUNTER — Ambulatory Visit: Payer: PPO | Admitting: Physical Therapy

## 2017-02-20 ENCOUNTER — Encounter: Payer: Self-pay | Admitting: Physical Therapy

## 2017-02-20 DIAGNOSIS — M6281 Muscle weakness (generalized): Secondary | ICD-10-CM

## 2017-02-20 DIAGNOSIS — M25562 Pain in left knee: Secondary | ICD-10-CM

## 2017-02-20 DIAGNOSIS — R262 Difficulty in walking, not elsewhere classified: Secondary | ICD-10-CM

## 2017-02-20 NOTE — Therapy (Signed)
rports he has an appointment with his PCP tomorrow and would like to wait until then. I asked that he call PCP today to let them know.    PT Next Visit Plan flexion & extension stretch, gait without AD/SPC as appropriate, Strength   PT Home Exercise Plan standing glut sets, sidelying hip abduction, heel slide, gravity knee flexion; active hamstring stretch, gastroc stretch, SLR   Consulted and Agree with Plan of Care Patient      Patient will benefit from skilled therapeutic intervention in order to improve the following deficits and impairments:     Visit Diagnosis: Acute pain of left knee  Difficulty in walking, not elsewhere classified  Muscle weakness (generalized)     Problem List Patient Active Problem List   Diagnosis Date Noted  . S/P total knee arthroplasty, left 01/30/2017  . Arthritis of knee 01/15/2017  . Skin rash 12/10/2016  . Acute bronchitis 11/07/2016  . Baker's cyst of knee, left 11/07/2016  . Medicare annual wellness visit, initial 10/05/2016  . Advanced care planning/counseling discussion 10/05/2016  . Nasal sore 10/05/2016  . Diarrhea 08/21/2016  . Obesity, Class II, BMI 35-39.9, with comorbidity 07/19/2016  . Hypothyroidism   . IDA (iron deficiency anemia) 04/23/2016  . Chronic pain syndrome 04/23/2016  . Inflammatory arthritis 04/23/2016  . Chronic leg pain 04/23/2016  . Lesion of palate 04/23/2016  . Grieving 04/23/2016  . GERD (gastroesophageal reflux disease)   . Duodenitis determined by biopsy 02/13/2016  . Diverticulosis of colon without hemorrhage 01/31/2016  . Migraine 01/11/2012  . MRSA (methicillin resistant Staphylococcus aureus) infection 07/04/2010  . DEGENERATIVE DISC DISEASE 07/04/2010  . History of total right knee replacement 07/04/2010  . Osteoarthritis 06/09/2007  . Personal history of colonic adenoma 06/01/2003    Nicholas [name] C. Nicholas [name] PT, DPT 02/20/17 3:01  PM   [place], [place], [address] Phone: [phone]   Fax:  [phone]  Name: Nicholas [name] MRN: [id number] Date of Birth: Feb 02, 1951  rports he has an appointment with his PCP tomorrow and would like to wait until then. I asked that he call PCP today to let them know.    PT Next Visit Plan flexion & extension stretch, gait without AD/SPC as appropriate, Strength   PT Home Exercise Plan standing glut sets, sidelying hip abduction, heel slide, gravity knee flexion; active hamstring stretch, gastroc stretch, SLR   Consulted and Agree with Plan of Care Patient      Patient will benefit from skilled therapeutic intervention in order to improve the following deficits and impairments:     Visit Diagnosis: Acute pain of left knee  Difficulty in walking, not elsewhere classified  Muscle weakness (generalized)     Problem List Patient Active Problem List   Diagnosis Date Noted  . S/P total knee arthroplasty, left 01/30/2017  . Arthritis of knee 01/15/2017  . Skin rash 12/10/2016  . Acute bronchitis 11/07/2016  . Baker's cyst of knee, left 11/07/2016  . Medicare annual wellness visit, initial 10/05/2016  . Advanced care planning/counseling discussion 10/05/2016  . Nasal sore 10/05/2016  . Diarrhea 08/21/2016  . Obesity, Class II, BMI 35-39.9, with comorbidity 07/19/2016  . Hypothyroidism   . IDA (iron deficiency anemia) 04/23/2016  . Chronic pain syndrome 04/23/2016  . Inflammatory arthritis 04/23/2016  . Chronic leg pain 04/23/2016  . Lesion of palate 04/23/2016  . Grieving 04/23/2016  . GERD (gastroesophageal reflux disease)   . Duodenitis determined by biopsy 02/13/2016  . Diverticulosis of colon without hemorrhage 01/31/2016  . Migraine 01/11/2012  . MRSA (methicillin resistant Staphylococcus aureus) infection 07/04/2010  . DEGENERATIVE DISC DISEASE 07/04/2010  . History of total right knee replacement 07/04/2010  . Osteoarthritis 06/09/2007  . Personal history of colonic adenoma 06/01/2003    Nicholas [name] C. Nicholas [name] PT, DPT 02/20/17 3:01  PM   [place], [place], [address] Phone: [phone]   Fax:  [phone]  Name: Nicholas [name] MRN: [id number] Date of Birth: Feb 02, 1951  [place], [place], [address] Phone: [phone]   Fax:  [phone]  Physical Therapy Treatment  Patient Details  Name: Nicholas [name] MRN: [id number] Date of Birth: [birth year] Referring Provider: Meredith Pel, MD  Encounter Date: 02/20/2017      PT End of Session - 02/20/17 1419    Visit Number 3   Number of Visits 9   Date for PT Re-Evaluation 03/15/17   Authorization Type MCR advantage   PT Start Time 1419   PT Stop Time 1455   PT Time Calculation (min) 36 min   Activity Tolerance Patient limited by pain   Behavior During Therapy [facility] for tasks assessed/performed      Past Medical History:  Diagnosis Date  . Acute kidney failure 08/2008   "cleared up"  no problems since  . Allergy   . Anxiety   . Arthritis   . Asthma    ?of this no inhaler  . Blood transfusion   . CHF (congestive heart failure) ([place])    ?of this, pt denies  . CLOSTRIDIUM DIFFICILE COLITIS 07/04/2010   Annotation: 12/09, 2/10 Qualifier: Diagnosis of  By: [name], [name]    . Depression   . Depression with anxiety   . Duodenitis determined by biopsy 02/2016   peptic likely due to aleve (erosive gastropathy with duodenal erosions)  . ECZEMA 07/04/2010   Qualifier: Diagnosis of  By: [name], [name]    . GERD (gastroesophageal reflux disease)   . Heart attack ([place])    08/2008 (likely demand ischemia in the setting of MSSA sepsis/R TKA infection)  . History of hiatal hernia   . History of stomach ulcers   . Hypothyroidism   . Lower GI bleeding   . MALAR AND MAXILLARY BONES CLOSED FRACTURE 07/04/2010   Annotation: ORIF Qualifier: Diagnosis of  By: [name], [name]    . Migraine    "definitely"  . MRSA (methicillin resistant Staphylococcus aureus)    in leg, had to place steel rod in leg  . Personal history of colonic adenoma 06/01/2003  . PFO (patent foramen ovale)    small PFO by 08/2008 TEE  . Pneumonia 08/2008    "while in ICU"  . Seizures ([place]) 2009   "long time ago"    . Stroke [facility]) 2010   unable to complete sentences at times    Past Surgical History:  Procedure Laterality Date  . anterior  nerve transposition  07/2009   left ulnar nerve  . antibiotic spacer exchange  11/2008; 08/2006   right knee  . ARTHROTOMY  08/2008   right knee w/I&D  . CATARACT EXTRACTION    . COLONOSCOPY  11/2012   diverticulosis, rpt 10 yrs [name])  . ESOPHAGOGASTRODUODENOSCOPY  02/2016   erosive gastropathy with duodenal erosions [name])  . HARDWARE REMOVAL  04/2006   right knee w/antibiotic spacers placed  . INGUINAL HERNIA REPAIR  early 1990's   bilateral  . JOINT REPLACEMENT Right    rt knee  . KNEE ARTHROSCOPY  10/2001   right  . KNEE FUSION  03/2009   right knee removal; antibiotic spacers;   . REPLACEMENT TOTAL KNEE  08/2008; 09/2001   right  . rod placement Right   . SYNOVECTOMY  06/2005   debridement, liner exchange right knee  . TOE AMPUTATION Left 08/2008   great toe  . TOE AMPUTATION Left 04/2016   2nd toe [name]  . TONSILLECTOMY    .

## 2017-02-21 ENCOUNTER — Ambulatory Visit (INDEPENDENT_AMBULATORY_CARE_PROVIDER_SITE_OTHER): Payer: PPO | Admitting: Family Medicine

## 2017-02-21 ENCOUNTER — Encounter: Payer: Self-pay | Admitting: Family Medicine

## 2017-02-21 VITALS — BP 120/82 | HR 90 | Temp 98.1°F | Ht 69.0 in | Wt 248.8 lb

## 2017-02-21 DIAGNOSIS — Z96651 Presence of right artificial knee joint: Secondary | ICD-10-CM | POA: Diagnosis not present

## 2017-02-21 DIAGNOSIS — E669 Obesity, unspecified: Secondary | ICD-10-CM

## 2017-02-21 DIAGNOSIS — M150 Primary generalized (osteo)arthritis: Secondary | ICD-10-CM | POA: Diagnosis not present

## 2017-02-21 DIAGNOSIS — M1990 Unspecified osteoarthritis, unspecified site: Secondary | ICD-10-CM

## 2017-02-21 DIAGNOSIS — G8929 Other chronic pain: Secondary | ICD-10-CM

## 2017-02-21 DIAGNOSIS — G894 Chronic pain syndrome: Secondary | ICD-10-CM

## 2017-02-21 DIAGNOSIS — M159 Polyosteoarthritis, unspecified: Secondary | ICD-10-CM

## 2017-02-21 DIAGNOSIS — IMO0001 Reserved for inherently not codable concepts without codable children: Secondary | ICD-10-CM

## 2017-02-21 DIAGNOSIS — M79604 Pain in right leg: Secondary | ICD-10-CM

## 2017-02-21 DIAGNOSIS — Z96652 Presence of left artificial knee joint: Secondary | ICD-10-CM | POA: Diagnosis not present

## 2017-02-21 NOTE — Progress Notes (Signed)
BP 120/82   Pulse 90   Temp 98.1 F (36.7 C) (Oral)   Ht 5\' 9"  (1.753 m)   Wt 248 lb 12.8 oz (112.9 kg)   SpO2 94%   BMI 36.74 kg/m    CC: f/u visit Subjective:    Patient ID: Nicholas [name], male    DOB: February 01, 1951, 66 y.o.   MRN: [id number]  HPI: Nicholas [name] is a 66 y.o. male presenting on 02/21/2017 for Follow-up and Leg Pain (right x couple weeks)   Recent L total knee arthroplasty by Dr [name] Sa last month, recovered well from this. Hospitalized 4/3-06/2017. Placed on xarelto short term for DVT ppx - pt unsure if he received this at SNF. Now off any blood thinner. Discharged to [facility] 4/9-02/16/2017. He is currently undergoing outpatient physical therapy.   He currently uses hydrocodone 10/325mg , received #60 upon discharge from hospital, received #10 from [facility] 02/16/2017. Currently out. He is also taking gabapentin and acetaminophen.   Chronic R leg pain, acutely worsened over last several week. Denies fevers/chills, streaking redness. Followed by Dr Humphrey Rolls. Currently under pain contract with Dr Humphrey Rolls.   Avoids NSAIDs due to GI upset.   Relevant past medical, surgical, family and social history reviewed and updated as indicated. Interim medical history since our last visit reviewed. Allergies and medications reviewed and updated. Outpatient Medications Prior to Visit  Medication Sig Dispense Refill  . amitriptyline (ELAVIL) 25 MG tablet Take 3 tablets (75 mg total) by mouth at bedtime. 90 tablet 1  . EPINEPHrine (EPIPEN 2-PAK) 0.3 mg/0.3 mL IJ SOAJ injection Inject 0.3 mg into the muscle as directed.     . ferrous gluconate (IRON 27) 240 (27 FE) MG tablet Take 240 mg by mouth daily.    [name] gabapentin (NEURONTIN) 300 MG capsule TAKE ONE CAPSULE BY MOUTH 4 TIMES A DAY 360 capsule 0  . HYDROcodone-acetaminophen (NORCO) 10-325 MG tablet Take 1 tablet by mouth every 4 (four) hours as needed (breakthrough pain). 60 tablet 0  . hydrocortisone 2.5 % cream Apply 1 application  topically at bedtime as needed (for rash).     [name] levothyroxine (SYNTHROID, LEVOTHROID) 25 MCG tablet Take 2 tablets (50 mcg total) by mouth daily before breakfast.    . Multiple Vitamin (MULTIVITAMIN WITH MINERALS) TABS tablet Take 1 tablet by mouth daily. Centrum silver.    . pantoprazole (PROTONIX) 40 MG tablet Take 1 tablet (40 mg total) by mouth daily before breakfast. 30 tablet 11  . methocarbamol (ROBAXIN) 500 MG tablet Take 1 tablet (500 mg total) by mouth every 6 (six) hours as needed for muscle spasms. 30 tablet 0  . rivaroxaban (XARELTO) 10 MG TABS tablet Take 1 tablet (10 mg total) by mouth daily with breakfast. 30 tablet 0  . SUMAtriptan (IMITREX) 50 MG tablet Take 1 tablet (50 mg total) by mouth once. May repeat in 2 hours if headache persists or recurs. 12 tablet 3   No facility-administered medications prior to visit.      Per HPI unless specifically indicated in ROS section below Review of Systems     Objective:    BP 120/82   Pulse 90   Temp 98.1 F (36.7 C) (Oral)   Ht 5\' 9"  (1.753 m)   Wt 248 lb 12.8 oz (112.9 kg)   SpO2 94%   BMI 36.74 kg/m   Wt Readings from Last 3 Encounters:  02/21/17 248 lb 12.8 oz (112.9 kg)  01/07/17 266 lb 4 oz (120.8  kg)  01/04/17 261 lb 12.8 oz (118.8 kg)    Physical Exam  Constitutional: He appears well-developed and well-nourished. No distress.  Musculoskeletal: He exhibits edema (expected postop).  R knee incision without induration, erythema No erythema or induration L knee chronically fused in extension without significant edema  Skin: Skin is warm and dry. No rash noted. No erythema.  Psychiatric: He has a normal mood and affect.  Nursing note and vitals reviewed.     Assessment & Plan:   Problem List Items Addressed This Visit    Chronic leg pain    No signs of DVT. He did not take xarelto post op. I suggested start aspirin 81mg  daily. I recommended f/u with ortho as scheduled.       Relevant Medications   aspirin  EC 81 MG tablet   Chronic pain syndrome    States he is followed by pain clinic Dr Humphrey Rolls. Recent narcotics have been prescribed by ortho in setting of recent knee replacement. I suggested he contact ortho for refill hydrocodone vs schedule f/u with pain management. Pt agrees with plan.      History of total right knee replacement   Inflammatory arthritis   Relevant Medications   aspirin EC 81 MG tablet   Obesity, Class II, BMI 35-39.9, with comorbidity    Weight loss noted.       Osteoarthritis    Discussed tylenol dosing.       Relevant Medications   aspirin EC 81 MG tablet   S/P total knee arthroplasty, left - Primary    Recovering wonderfully after recent L knee replacement surgery last month [name]. Now attending outpatient physical therapy 2-3 times per week. Reviewed clinical course to date. He has f/u planned with ortho next week.           Follow up plan: Return in about 3 months (around 05/24/2017) for follow up visit.  [name], MD

## 2017-02-21 NOTE — Patient Instructions (Addendum)
You are doing great after knee surgery!  i'm glad recovery is going so well.  Start aspirin 81mg  daily, enteric coated.  Keep follow up with Dr [name] Sa. Touch base with Dr Humphrey Rolls about narcotic plan. Continue tylenol and gabapentin for now.  Return in 2-3 months for follow up visit.

## 2017-02-21 NOTE — Assessment & Plan Note (Signed)
Weight loss noted  

## 2017-02-21 NOTE — Assessment & Plan Note (Signed)
Recovering wonderfully after recent L knee replacement surgery last month [name]. Now attending outpatient physical therapy 2-3 times per week. Reviewed clinical course to date. He has f/u planned with ortho next week.

## 2017-02-21 NOTE — Assessment & Plan Note (Signed)
No signs of DVT. He did not take xarelto post op. I suggested start aspirin 81mg  daily. I recommended f/u with ortho as scheduled.

## 2017-02-21 NOTE — Assessment & Plan Note (Signed)
States he is followed by pain clinic Dr Humphrey Rolls. Recent narcotics have been prescribed by ortho in setting of recent knee replacement. I suggested he contact ortho for refill hydrocodone vs schedule f/u with pain management. Pt agrees with plan.

## 2017-02-21 NOTE — Assessment & Plan Note (Signed)
Discussed tylenol dosing.

## 2017-02-25 ENCOUNTER — Ambulatory Visit: Payer: PPO | Admitting: Physical Therapy

## 2017-02-25 DIAGNOSIS — M25562 Pain in left knee: Secondary | ICD-10-CM

## 2017-02-25 DIAGNOSIS — M6281 Muscle weakness (generalized): Secondary | ICD-10-CM

## 2017-02-25 DIAGNOSIS — R262 Difficulty in walking, not elsewhere classified: Secondary | ICD-10-CM

## 2017-02-25 NOTE — Therapy (Signed)
[facility] [address] [place], [place], [address] Phone: [phone]   Fax:  [phone]  Physical Therapy Treatment  Patient Details  Name: Nicholas [name] MRN: [id number] Date of Birth: 1951/06/20 Referring Provider: [name], MD  Encounter Date: 02/25/2017      PT End of Session - 02/25/17 1101    Visit Number 4   Number of Visits 9   Date for PT Re-Evaluation 03/15/17   Authorization Type MCR advantage   PT Start Time 1102   PT Stop Time 1152   PT Time Calculation (min) 50 min   Activity Tolerance Patient tolerated treatment well   Behavior During Therapy [facility] for tasks assessed/performed      Past Medical History:  Diagnosis Date  . Acute kidney failure 08/2008   "cleared up"  no problems since  . Allergy   . Anxiety   . Arthritis   . Asthma    ?of this no inhaler  . Blood transfusion   . CHF (congestive heart failure) (HCC)    ?of this, pt denies  . CLOSTRIDIUM DIFFICILE COLITIS 07/04/2010   Annotation: 12/09, 2/10 Qualifier: Diagnosis of  By: [name], [name]    . Depression   . Depression with anxiety   . Duodenitis determined by biopsy 02/2016   peptic likely due to aleve (erosive gastropathy with duodenal erosions)  . ECZEMA 07/04/2010   Qualifier: Diagnosis of  By: [name], [name]    . GERD (gastroesophageal reflux disease)   . Heart attack (HCC)    08/2008 (likely demand ischemia in the setting of MSSA sepsis/R TKA infection)  . History of hiatal hernia   . History of stomach ulcers   . Hypothyroidism   . Lower GI bleeding   . MALAR AND MAXILLARY BONES CLOSED FRACTURE 07/04/2010   Annotation: ORIF Qualifier: Diagnosis of  By: [name], [name]    . Migraine    "definitely"  . MRSA (methicillin resistant Staphylococcus aureus)    in leg, had to place steel rod in leg  . Personal history of colonic adenoma 06/01/2003  . PFO (patent foramen ovale)    small PFO by 08/2008 TEE  .  Pneumonia 08/2008   "while in ICU"  . Seizures (HCC) 2009   "long time ago"    . Stroke [facility]) 2010   unable to complete sentences at times    Past Surgical History:  Procedure Laterality Date  . anterior  nerve transposition  07/2009   left ulnar nerve  . antibiotic spacer exchange  11/2008; 08/2006   right knee  . ARTHROTOMY  08/2008   right knee w/I&D  . CATARACT EXTRACTION    . COLONOSCOPY  11/2012   diverticulosis, rpt 10 yrs [name])  . ESOPHAGOGASTRODUODENOSCOPY  02/2016   erosive gastropathy with duodenal erosions [name])  . HARDWARE REMOVAL  04/2006   right knee w/antibiotic spacers placed  . INGUINAL HERNIA REPAIR  early 1990's   bilateral  . JOINT REPLACEMENT Right    rt knee  . KNEE ARTHROSCOPY  10/2001   right  . KNEE FUSION  03/2009   right knee removal; antibiotic spacers;   . REPLACEMENT TOTAL KNEE  08/2008; 09/2001   right  . rod placement Right   . SYNOVECTOMY  06/2005   debridement, liner exchange right knee  . TOE AMPUTATION Left 08/2008   great toe  . TOE AMPUTATION Left 04/2016   2nd toe August Saucer)  . TONSILLECTOMY    .  TOTAL KNEE ARTHROPLASTY Left 01/15/2017  . TOTAL KNEE ARTHROPLASTY Left 01/15/2017   Procedure: TOTAL KNEE ARTHROPLASTY;  Surgeon: [name], MD;  Location: [facility] OR;  Service: Orthopedics;  Laterality: Left;    There were no vitals filed for this visit.      Subjective Assessment - 02/25/17 1117    Subjective Feels sore today, was walking around a lot this weekend   Patient Stated Goals 0-120, decrease pain, likes to do yard work, ambulate without AD   Currently in Pain? Yes   Pain Score 8    Pain Location Knee   Pain Orientation Left   Pain Descriptors / Indicators Sore                         [facility] Adult PT Treatment/Exercise - 02/25/17 0001      Knee/Hip Exercises: Stretches   Gastroc Stretch 2 reps;30 seconds   Gastroc Stretch Limitations slant board     Knee/Hip Exercises: Standing   Heel Raises  2 sets;10 reps     Knee/Hip Exercises: Supine   Quad Sets 10 reps   Quad Sets Limitations with leg elevated following manual   Short Arc [employer] 10 reps   Short Arc Quad Sets Limitations 3s holds   Straight Leg Raises Left;10 reps     Knee/Hip Exercises: Sidelying   Hip ABduction 20 reps;Both     Modalities   Modalities Vasopneumatic     Vasopneumatic   Number Minutes Vasopneumatic  15 minutes   Vasopnuematic Location  Knee   Vasopneumatic Pressure Low   Vasopneumatic Temperature  34      Manual Therapy   Manual Therapy Edema management   Edema Management lower leg to above knee                  PT Short Term Goals - 02/14/17 1308      PT SHORT TERM GOAL #1   Title 0-120 deg ROM to allow for necessary ROM to stand from std height chairs and toilets by 6/1   Baseline see flowsheet   Time 4   Period Weeks   Status New     PT SHORT TERM GOAL #2   Title hip abduction and extension to 4+/5 to indicate proper proximal support for distal control of knee joint   Baseline see flowsheet   Time 4   Period Weeks   Status New     PT SHORT TERM GOAL #3   Title FOTO to 70% ability to indicate significant improvement in functional ability   Baseline 46% at eval   Time 4   Period Weeks   Status New     PT SHORT TERM GOAL #4   Title Pt will be able to safely and confidently ambulate community distances without use of walker   Baseline uses walker outside of home at eval   Time 4   Period Weeks   Status New                  Plan - 02/25/17 1120    Clinical Impression Statement Significant edema noted in leg from knee down after warm weekend and a lot of walking. Decreased soreness after manual edema mobilization, knee ROM increased from 107 prior to treatment to 116 post. Utilized vaso with leg elevated following treatment to continue decreasing LE edema.    PT Next Visit Plan consider edema taping prn, ROM, hip strengthening   PT  Home Exercise Plan  standing glut sets, sidelying hip abduction, heel slide, gravity knee flexion; active hamstring stretch, gastroc stretch, SLR   Consulted and Agree with Plan of Care Patient      Patient will benefit from skilled therapeutic intervention in order to improve the following deficits and impairments:     Visit Diagnosis: Acute pain of left knee  Difficulty in walking, not elsewhere classified  Muscle weakness (generalized)     Problem List Patient Active Problem List   Diagnosis Date Noted  . S/P total knee arthroplasty, left 01/30/2017  . Arthritis of knee 01/15/2017  . Skin rash 12/10/2016  . Medicare annual wellness visit, initial 10/05/2016  . Advanced care planning/counseling discussion 10/05/2016  . Diarrhea 08/21/2016  . Obesity, Class II, BMI 35-39.9, with comorbidity 07/19/2016  . Hypothyroidism   . IDA (iron deficiency anemia) 04/23/2016  . Chronic pain syndrome 04/23/2016  . Inflammatory arthritis 04/23/2016  . Chronic leg pain 04/23/2016  . Lesion of palate 04/23/2016  . Grieving 04/23/2016  . GERD (gastroesophageal reflux disease)   . Duodenitis determined by biopsy 02/13/2016  . Diverticulosis of colon without hemorrhage 01/31/2016  . Migraine 01/11/2012  . History of methicillin resistant staphylococcus aureus (MRSA) 07/04/2010  . DEGENERATIVE DISC DISEASE 07/04/2010  . History of total right knee replacement 07/04/2010  . Osteoarthritis 06/09/2007  . Personal history of colonic adenoma 06/01/2003   Franciso Dierks C. Juwana Thoreson PT, DPT 02/25/17 11:46 AM   [facility] [address] [place], [place], [address] Phone: [phone]   Fax:  [phone]  Name: Nicholas [name] MRN: [id number] Date of Birth: 12-Apr-1951

## 2017-02-27 ENCOUNTER — Encounter (INDEPENDENT_AMBULATORY_CARE_PROVIDER_SITE_OTHER): Payer: Self-pay | Admitting: Orthopedic Surgery

## 2017-02-27 ENCOUNTER — Telehealth: Payer: Self-pay

## 2017-02-27 ENCOUNTER — Ambulatory Visit (INDEPENDENT_AMBULATORY_CARE_PROVIDER_SITE_OTHER): Payer: PPO | Admitting: Orthopedic Surgery

## 2017-02-27 DIAGNOSIS — Z96652 Presence of left artificial knee joint: Secondary | ICD-10-CM

## 2017-02-27 MED ORDER — HYDROCODONE-ACETAMINOPHEN 5-325 MG PO TABS: 1.0000 | ORAL_TABLET | Freq: Two times a day (BID) | ORAL | 0 refills | Status: DC | PRN

## 2017-02-27 NOTE — Telephone Encounter (Signed)
Nicholas [name] with [facility] left VM to let you know that physical therapist will be going to see pt today to start home health services.

## 2017-02-27 NOTE — Progress Notes (Signed)
Post-Op Visit Note   Patient: Nicholas [name]           Date of Birth: 1951-08-11           MRN: [id number] Visit Date: 02/27/2017 PCP: [name], MD   Assessment & Plan:  Chief Complaint:  Chief Complaint  Patient presents with  . Left Knee - Follow-up, Routine Post Op   Visit Diagnoses:  1. Presence of left artificial knee joint     Plan: Nicholas [name] is a 66 year old patient who is now about a month out from left total knee replacement doing well on exam he has flexion 116 full extension trace effusion appropriate swelling no erythema plan is to refill Norco 1   8 week return for clinical recheck at that time.  Follow-Up Instructions: Return in about 8 weeks (around 04/24/2017).   Orders:  No orders of the defined types were placed in this encounter.  Meds ordered this encounter  Medications  . HYDROcodone-acetaminophen (NORCO/VICODIN) 5-325 MG tablet    Sig: Take 1 tablet by mouth every 12 (twelve) hours as needed for moderate pain.    Dispense:  30 tablet    Refill:  0    Imaging: No results found.  PMFS History: Patient Active Problem List   Diagnosis Date Noted  . S/P total knee arthroplasty, left 01/30/2017  . Arthritis of knee 01/15/2017  . Skin rash 12/10/2016  . Medicare annual wellness visit, initial 10/05/2016  . Advanced care planning/counseling discussion 10/05/2016  . Diarrhea 08/21/2016  . Obesity, Class II, BMI 35-39.9, with comorbidity 07/19/2016  . Hypothyroidism   . IDA (iron deficiency anemia) 04/23/2016  . Chronic pain syndrome 04/23/2016  . Inflammatory arthritis 04/23/2016  . Chronic leg pain 04/23/2016  . Lesion of palate 04/23/2016  . Grieving 04/23/2016  . GERD (gastroesophageal reflux disease)   . Duodenitis determined by biopsy 02/13/2016  . Diverticulosis of colon without hemorrhage 01/31/2016  . Migraine 01/11/2012  . History of methicillin resistant staphylococcus aureus (MRSA) 07/04/2010  . DEGENERATIVE DISC DISEASE  07/04/2010  . History of total right knee replacement 07/04/2010  . Osteoarthritis 06/09/2007  . Personal history of colonic adenoma 06/01/2003   Past Medical History:  Diagnosis Date  . Acute kidney failure 08/2008   "cleared up"  no problems since  . Allergy   . Anxiety   . Arthritis   . Asthma    ?of this no inhaler  . Blood transfusion   . CHF (congestive heart failure) ([place])    ?of this, pt denies  . CLOSTRIDIUM DIFFICILE COLITIS 07/04/2010   Annotation: 12/09, 2/10 Qualifier: Diagnosis of  By: [name], [name]    . Depression   . Depression with anxiety   . Duodenitis determined by biopsy 02/2016   peptic likely due to aleve (erosive gastropathy with duodenal erosions)  . ECZEMA 07/04/2010   Qualifier: Diagnosis of  By: [name], [name]    . GERD (gastroesophageal reflux disease)   . Heart attack ([place])    08/2008 (likely demand ischemia in the setting of MSSA sepsis/R TKA infection)  . History of hiatal hernia   . History of stomach ulcers   . Hypothyroidism   . Lower GI bleeding   . MALAR AND MAXILLARY BONES CLOSED FRACTURE 07/04/2010   Annotation: ORIF Qualifier: Diagnosis of  By: [name], [name]    . Migraine    "definitely"  . MRSA (methicillin resistant Staphylococcus aureus)    in leg, had to place steel rod  in leg  . Personal history of colonic adenoma 06/01/2003  . PFO (patent foramen ovale)    small PFO by 08/2008 TEE  . Pneumonia 08/2008   "while in ICU"  . Seizures ([place]) 2009   "long time ago"    . Stroke [facility]) 2010   unable to complete sentences at times    Family History  Problem Relation Age of Onset  . Coronary artery disease Father   . Heart disease Mother   . Breast cancer Sister   . Diabetes Brother   . Diabetes Sister   . Colon cancer Neg Hx   . Stomach cancer Neg Hx     Past Surgical History:  Procedure Laterality Date  . anterior  nerve transposition  07/2009   left ulnar nerve  . antibiotic spacer exchange  11/2008; 08/2006    right knee  . ARTHROTOMY  08/2008   right knee w/I&D  . CATARACT EXTRACTION    . COLONOSCOPY  11/2012   diverticulosis, rpt 10 yrs [name])  . ESOPHAGOGASTRODUODENOSCOPY  02/2016   erosive gastropathy with duodenal erosions [name])  . HARDWARE REMOVAL  04/2006   right knee w/antibiotic spacers placed  . INGUINAL HERNIA REPAIR  early 1990's   bilateral  . JOINT REPLACEMENT Right    rt knee  . KNEE ARTHROSCOPY  10/2001   right  . KNEE FUSION  03/2009   right knee removal; antibiotic spacers;   . REPLACEMENT TOTAL KNEE  08/2008; 09/2001   right  . rod placement Right   . SYNOVECTOMY  06/2005   debridement, liner exchange right knee  . TOE AMPUTATION Left 08/2008   great toe  . TOE AMPUTATION Left 04/2016   2nd toe [name]  . TONSILLECTOMY    . TOTAL KNEE ARTHROPLASTY Left 01/15/2017  . TOTAL KNEE ARTHROPLASTY Left 01/15/2017   Procedure: TOTAL KNEE ARTHROPLASTY;  Surgeon: Meredith Pel, MD;  Location: [place];  Service: Orthopedics;  Laterality: Left;   Social History   Occupational History  . retired    Social History Main Topics  . Smoking status: Never Smoker  . Smokeless tobacco: Never Used  . Alcohol use No     Comment: "I abused alcohol; last drink  ~ 2005"  . Drug use: No  . Sexual activity: No

## 2017-02-27 NOTE — Telephone Encounter (Signed)
Noted  

## 2017-02-28 ENCOUNTER — Ambulatory Visit: Payer: PPO

## 2017-02-28 DIAGNOSIS — R262 Difficulty in walking, not elsewhere classified: Secondary | ICD-10-CM

## 2017-02-28 DIAGNOSIS — M25661 Stiffness of right knee, not elsewhere classified: Secondary | ICD-10-CM

## 2017-02-28 DIAGNOSIS — M25562 Pain in left knee: Secondary | ICD-10-CM

## 2017-02-28 DIAGNOSIS — M6281 Muscle weakness (generalized): Secondary | ICD-10-CM

## 2017-02-28 NOTE — Therapy (Signed)
Nicholas [name], [place], [address] Phone: [phone]   Fax:  [phone]  Physical Therapy Treatment  Patient Details  Name: Nicholas [name] MRN: [id number] Date of Birth: 10/08/1951 Referring Provider: Meredith Pel, MD  Encounter Date: 02/28/2017      PT End of Session - 02/28/17 1056    Visit Number 5   Number of Visits 9   Date for PT Re-Evaluation 03/15/17   Authorization Type MCR advantage   PT Start Time 1055   PT Stop Time 1150   PT Time Calculation (min) 55 min   Activity Tolerance Patient tolerated treatment well   Behavior During Therapy [facility] for tasks assessed/performed      Past Medical History:  Diagnosis Date  . Acute kidney failure 08/2008   "cleared up"  no problems since  . Allergy   . Anxiety   . Arthritis   . Asthma    ?of this no inhaler  . Blood transfusion   . CHF (congestive heart failure) ([place])    ?of this, pt denies  . CLOSTRIDIUM DIFFICILE COLITIS 07/04/2010   Annotation: 12/09, 2/10 Qualifier: Diagnosis of  By: [name], [name]    . Depression   . Depression with anxiety   . Duodenitis determined by biopsy 02/2016   peptic likely due to aleve (erosive gastropathy with duodenal erosions)  . ECZEMA 07/04/2010   Qualifier: Diagnosis of  By: [name], [name]    . GERD (gastroesophageal reflux disease)   . Heart attack ([place])    08/2008 (likely demand ischemia in the setting of MSSA sepsis/R TKA infection)  . History of hiatal hernia   . History of stomach ulcers   . Hypothyroidism   . Lower GI bleeding   . MALAR AND MAXILLARY BONES CLOSED FRACTURE 07/04/2010   Annotation: ORIF Qualifier: Diagnosis of  By: [name], [name]    . Migraine    "definitely"  . MRSA (methicillin resistant Staphylococcus aureus)    in leg, had to place steel rod in leg  . Personal history of colonic adenoma 06/01/2003  . PFO (patent foramen ovale)    small PFO by 08/2008 TEE  .  Pneumonia 08/2008   "while in ICU"  . Seizures ([place]) 2009   "long time ago"    . Stroke [facility]) 2010   unable to complete sentences at times    Past Surgical History:  Procedure Laterality Date  . anterior  nerve transposition  07/2009   left ulnar nerve  . antibiotic spacer exchange  11/2008; 08/2006   right knee  . ARTHROTOMY  08/2008   right knee w/I&D  . CATARACT EXTRACTION    . COLONOSCOPY  11/2012   diverticulosis, rpt 10 yrs [name])  . ESOPHAGOGASTRODUODENOSCOPY  02/2016   erosive gastropathy with duodenal erosions [name])  . HARDWARE REMOVAL  04/2006   right knee w/antibiotic spacers placed  . INGUINAL HERNIA REPAIR  early 1990's   bilateral  . JOINT REPLACEMENT Right    rt knee  . KNEE ARTHROSCOPY  10/2001   right  . KNEE FUSION  03/2009   right knee removal; antibiotic spacers;   . REPLACEMENT TOTAL KNEE  08/2008; 09/2001   right  . rod placement Right   . SYNOVECTOMY  06/2005   debridement, liner exchange right knee  . TOE AMPUTATION Left 08/2008   great toe  . TOE AMPUTATION Left 04/2016   2nd toe [name]  . TONSILLECTOMY    .  TOTAL KNEE ARTHROPLASTY Left 01/15/2017  . TOTAL KNEE ARTHROPLASTY Left 01/15/2017   Procedure: TOTAL KNEE ARTHROPLASTY;  Surgeon: Meredith Pel, MD;  Location: [place];  Service: Orthopedics;  Laterality: Left;    There were no vitals filed for this visit.      Subjective Assessment - 02/28/17 1058    Subjective Pain better today , MD pleased , does not walk with device now, had questions about time frame of care   Currently in Pain? Yes   Pain Score 2    Pain Location Knee   Pain Orientation Left   Pain Descriptors / Indicators Sore   Pain Type Surgical pain   Pain Onset More than a month ago   Pain Frequency Intermittent   Aggravating Factors  activity   Pain Relieving Factors meds, cold   Multiple Pain Sites No                         [facility] Adult PT Treatment/Exercise - 02/28/17 0001      Knee/Hip  Exercises: Supine   Short Arc Quad Sets Left   Short Arc Quad Sets Limitations 30 reps 3 sec hold 5 pounds   Straight Leg Raises Right;Left;2 sets;10 reps     Knee/Hip Exercises: Sidelying   Hip ABduction Right;Left   Hip ABduction Limitations 12 reps x 2     Knee/Hip Exercises: Prone   Hip Extension Right;Left   Hip Extension Limitations 12 reps x 2     Cryotherapy   Number Minutes Cryotherapy 12 Minutes   Cryotherapy Location Knee  L   Type of Cryotherapy Ice pack     Manual Therapy   Manual Therapy Joint mobilization;Soft tissue mobilization;Passive ROM   Joint Mobilization AP glides gr 34-   Soft tissue mobilization to LT quad and scar   Passive ROM 30-60 sec left knee flexion multiple reps with active LT knee flexion to 114 degrees post                   PT Short Term Goals - 02/28/17 1059      PT SHORT TERM GOAL #1   Title 0-120 deg ROM to allow for necessary ROM to stand from std height chairs and toilets by 6/1   Baseline 108 today at start of session   Status On-going     PT SHORT TERM GOAL #2   Title hip abduction and extension to 4+/5 to indicate proper proximal support for distal control of knee joint   Baseline 4/5 or better butr , 4+/5 needing verbal cues for effort   Status On-going     PT SHORT TERM GOAL #3   Title FOTO to 70% ability to indicate significant improvement in functional ability   Status Unable to assess     PT SHORT TERM GOAL #4   Title Pt will be able to safely and confidently ambulate community distances without use of walker   Baseline does not use device now   Status Achieved                  Plan - 02/28/17 1149    Clinical Impression Statement PAin under bette control today though sore post session with request for cold pack befor going home  and will ice at home as needed. Range improved post stretching.  Need to go to closed chain exercise if pain low.    PT Treatment/Interventions ADLs/Self Care Home  Management;Cryotherapy;Electrical Stimulation;Functional mobility  training;Stair training;Gait training;Ultrasound;Moist Heat;Therapeutic activities;Therapeutic exercise;Balance training;Neuromuscular re-education;Patient/family education;Passive range of motion;Scar mobilization;Manual techniques;Dry needling;Taping;Vasopneumatic Device   PT Next Visit Plan Closed chain exercise . manual for ROM   PT Home Exercise Plan standing glut sets, sidelying hip abduction, heel slide, gravity knee flexion; active hamstring stretch, gastroc stretch, SLR   Consulted and Agree with Plan of Care Patient      Patient will benefit from skilled therapeutic intervention in order to improve the following deficits and impairments:  Abnormal gait, Decreased range of motion, Difficulty walking, Decreased activity tolerance, Pain, Improper body mechanics, Decreased balance, Decreased strength  Visit Diagnosis: Acute pain of left knee  Difficulty in walking, not elsewhere classified  Muscle weakness (generalized)  Stiffness of right knee, not elsewhere classified     Problem List Patient Active Problem List   Diagnosis Date Noted  . S/P total knee arthroplasty, left 01/30/2017  . Arthritis of knee 01/15/2017  . Skin rash 12/10/2016  . Medicare annual wellness visit, initial 10/05/2016  . Advanced care planning/counseling discussion 10/05/2016  . Diarrhea 08/21/2016  . Obesity, Class II, BMI 35-39.9, with comorbidity 07/19/2016  . Hypothyroidism   . IDA (iron deficiency anemia) 04/23/2016  . Chronic pain syndrome 04/23/2016  . Inflammatory arthritis 04/23/2016  . Chronic leg pain 04/23/2016  . Lesion of palate 04/23/2016  . Grieving 04/23/2016  . GERD (gastroesophageal reflux disease)   . Duodenitis determined by biopsy 02/13/2016  . Diverticulosis of colon without hemorrhage 01/31/2016  . Migraine 01/11/2012  . History of methicillin resistant staphylococcus aureus (MRSA) 07/04/2010  .  DEGENERATIVE DISC DISEASE 07/04/2010  . History of total right knee replacement 07/04/2010  . Osteoarthritis 06/09/2007  . Personal history of colonic adenoma 06/01/2003    Darrel Hoover PT 02/28/2017, 11:52 AM  [facility] [address] [place], [place], [address] Phone: [phone]   Fax:  [phone]  Name: ASTOR GENTLE MRN: [id number] Date of Birth: [birth year]

## 2017-03-04 ENCOUNTER — Ambulatory Visit: Payer: PPO

## 2017-03-04 DIAGNOSIS — M25562 Pain in left knee: Secondary | ICD-10-CM

## 2017-03-04 DIAGNOSIS — R262 Difficulty in walking, not elsewhere classified: Secondary | ICD-10-CM

## 2017-03-04 DIAGNOSIS — M25661 Stiffness of right knee, not elsewhere classified: Secondary | ICD-10-CM

## 2017-03-04 DIAGNOSIS — M6281 Muscle weakness (generalized): Secondary | ICD-10-CM

## 2017-03-04 NOTE — Therapy (Signed)
[place], [place], [address] Phone: [phone]   Fax:  [phone]  Physical Therapy Treatment  Patient Details  Name: Nicholas [name] MRN: [id number] Date of Birth: Jul 15, 1951 Referring Provider: Meredith Pel, MD  Encounter Date: 03/04/2017      PT End of Session - 03/04/17 1144    Visit Number 6   Number of Visits 9   Date for PT Re-Evaluation 03/15/17   Authorization Type MCR advantage   PT Start Time 1100   PT Stop Time 1142   PT Time Calculation (min) 42 min   Activity Tolerance Patient tolerated treatment well;No increased pain   Behavior During Therapy WFL for tasks assessed/performed      Past Medical History:  Diagnosis Date  . Acute kidney failure 08/2008   "cleared up"  no problems since  . Allergy   . Anxiety   . Arthritis   . Asthma    ?of this no inhaler  . Blood transfusion   . CHF (congestive heart failure) ([place])    ?of this, pt denies  . CLOSTRIDIUM DIFFICILE COLITIS 07/04/2010   Annotation: 12/09, 2/10 Qualifier: Diagnosis of  By: [name], [name]    . Depression   . Depression with anxiety   . Duodenitis determined by biopsy 02/2016   peptic likely due to aleve (erosive gastropathy with duodenal erosions)  . ECZEMA 07/04/2010   Qualifier: Diagnosis of  By: [name], [name]    . GERD (gastroesophageal reflux disease)   . Heart attack ([place])    08/2008 (likely demand ischemia in the setting of MSSA sepsis/R TKA infection)  . History of hiatal hernia   . History of stomach ulcers   . Hypothyroidism   . Lower GI bleeding   . MALAR AND MAXILLARY BONES CLOSED FRACTURE 07/04/2010   Annotation: ORIF Qualifier: Diagnosis of  By: [name], [name]    . Migraine    "definitely"  . MRSA (methicillin resistant Staphylococcus aureus)    in leg, had to place steel rod in leg  . Personal history of colonic adenoma 06/01/2003  . PFO (patent foramen ovale)    small PFO by  08/2008 TEE  . Pneumonia 08/2008   "while in ICU"  . Seizures ([place]) 2009   "long time ago"    . Stroke [facility]) 2010   unable to complete sentences at times    Past Surgical History:  Procedure Laterality Date  . anterior  nerve transposition  07/2009   left ulnar nerve  . antibiotic spacer exchange  11/2008; 08/2006   right knee  . ARTHROTOMY  08/2008   right knee w/I&D  . CATARACT EXTRACTION    . COLONOSCOPY  11/2012   diverticulosis, rpt 10 yrs [name])  . ESOPHAGOGASTRODUODENOSCOPY  02/2016   erosive gastropathy with duodenal erosions [name])  . HARDWARE REMOVAL  04/2006   right knee w/antibiotic spacers placed  . INGUINAL HERNIA REPAIR  early 1990's   bilateral  . JOINT REPLACEMENT Right    rt knee  . KNEE ARTHROSCOPY  10/2001   right  . KNEE FUSION  03/2009   right knee removal; antibiotic spacers;   . REPLACEMENT TOTAL KNEE  08/2008; 09/2001   right  . rod placement Right   . SYNOVECTOMY  06/2005   debridement, liner exchange right knee  . TOE AMPUTATION Left 08/2008   great toe  . TOE AMPUTATION Left 04/2016   2nd toe [name]  . TONSILLECTOMY    .  TOTAL KNEE ARTHROPLASTY Left 01/15/2017  . TOTAL KNEE ARTHROPLASTY Left 01/15/2017   Procedure: TOTAL KNEE ARTHROPLASTY;  Surgeon: Meredith Pel, MD;  Location: [facility];  Service: Orthopedics;  Laterality: Left;    There were no vitals filed for this visit.                       [facility] Adult PT Treatment/Exercise - 03/04/17 0001      Knee/Hip Exercises: Seated   Long Arc Quad 3 sets;10 reps   Long Arc Quad Weight 5 lbs.     Knee/Hip Exercises: Supine   Straight Leg Raises Right;Left;2 sets;10 reps     Knee/Hip Exercises: Sidelying   Hip ABduction Right;Left   Hip ABduction Limitations 12 reps x 2     Knee/Hip Exercises: Prone   Hip Extension Right;Left   Hip Extension Limitations 12 reps x 2     Cryotherapy   Number Minutes Cryotherapy 12 Minutes   Cryotherapy Location Knee  left   Type  of Cryotherapy Ice pack     Manual Therapy   Joint Mobilization AP glides gr 3-4   Soft tissue mobilization to LT quad and scar   Passive ROM 30-60 sec left knee flexion multiple reps with active LT knee flexion to 114 degrees post       Active flexion between sets of stretching            PT Short Term Goals - 02/28/17 1059      PT SHORT TERM GOAL #1   Title 0-120 deg ROM to allow for necessary ROM to stand from std height chairs and toilets by 6/1   Baseline 108 today at start of session   Status On-going     PT SHORT TERM GOAL #2   Title hip abduction and extension to 4+/5 to indicate proper proximal support for distal control of knee joint   Baseline 4/5 or better butr , 4+/5 needing verbal cues for effort   Status On-going     PT SHORT TERM GOAL #3   Title FOTO to 70% ability to indicate significant improvement in functional ability   Status Unable to assess     PT SHORT TERM GOAL #4   Title Pt will be able to safely and confidently ambulate community distances without use of walker   Baseline does not use device now   Status Achieved                  Plan - 03/04/17 1145    Clinical Impression Statement Declined cold at end . active flexion  120 degrees at end of session.  Tolerated 5 pounds with exercises.   Should be ready for discharge end of month . Hopefully he can gain flexion to 125 degrees and encouraged him to push ROm at home to improve ROM   PT Treatment/Interventions ADLs/Self Care Home Management;Cryotherapy;Electrical Stimulation;Functional mobility training;Stair training;Gait training;Ultrasound;Moist Heat;Therapeutic activities;Therapeutic exercise;Balance training;Neuromuscular re-education;Patient/family education;Passive range of motion;Scar mobilization;Manual techniques;Dry needling;Taping;Vasopneumatic Device   PT Next Visit Plan Closed chain exercise . manual for ROM   PT Home Exercise Plan standing glut sets, sidelying hip  abduction, heel slide, gravity knee flexion; active hamstring stretch, gastroc stretch, SLR   Consulted and Agree with Plan of Care Patient      Patient will benefit from skilled therapeutic intervention in order to improve the following deficits and impairments:  Abnormal gait, Decreased range of motion, Difficulty walking, Decreased activity tolerance, Pain, Improper  body mechanics, Decreased balance, Decreased strength  Visit Diagnosis: Acute pain of left knee  Difficulty in walking, not elsewhere classified  Muscle weakness (generalized)  Stiffness of right knee, not elsewhere classified     Problem List Patient Active Problem List   Diagnosis Date Noted  . S/P total knee arthroplasty, left 01/30/2017  . Arthritis of knee 01/15/2017  . Skin rash 12/10/2016  . Medicare annual wellness visit, initial 10/05/2016  . Advanced care planning/counseling discussion 10/05/2016  . Diarrhea 08/21/2016  . Obesity, Class II, BMI 35-39.9, with comorbidity 07/19/2016  . Hypothyroidism   . IDA (iron deficiency anemia) 04/23/2016  . Chronic pain syndrome 04/23/2016  . Inflammatory arthritis 04/23/2016  . Chronic leg pain 04/23/2016  . Lesion of palate 04/23/2016  . Grieving 04/23/2016  . GERD (gastroesophageal reflux disease)   . Duodenitis determined by biopsy 02/13/2016  . Diverticulosis of colon without hemorrhage 01/31/2016  . Migraine 01/11/2012  . History of methicillin resistant staphylococcus aureus (MRSA) 07/04/2010  . DEGENERATIVE DISC DISEASE 07/04/2010  . History of total right knee replacement 07/04/2010  . Osteoarthritis 06/09/2007  . Personal history of colonic adenoma 06/01/2003    Darrel Hoover  PT 03/04/2017, 11:47 AM  [facility] [address] [place], [place], [address] Phone: [phone]   Fax:  [phone]  Name: DAVONTA STROOT MRN: [id number] Date of Birth: 01/19/1951

## 2017-03-06 ENCOUNTER — Ambulatory Visit: Payer: PPO | Admitting: Physical Therapy

## 2017-03-06 ENCOUNTER — Encounter: Payer: Self-pay | Admitting: Physical Therapy

## 2017-03-06 DIAGNOSIS — M25661 Stiffness of right knee, not elsewhere classified: Secondary | ICD-10-CM

## 2017-03-06 DIAGNOSIS — R262 Difficulty in walking, not elsewhere classified: Secondary | ICD-10-CM

## 2017-03-06 DIAGNOSIS — M6281 Muscle weakness (generalized): Secondary | ICD-10-CM

## 2017-03-06 DIAGNOSIS — M25562 Pain in left knee: Secondary | ICD-10-CM

## 2017-03-06 NOTE — Therapy (Signed)
[place], [place], [address] Phone: [phone]   Fax:  [phone]  Physical Therapy Treatment/Discharge Summary  Patient Details  Name: Nicholas [name] MRN: [id number] Date of Birth: [birth year] Referring Provider: Meredith Pel, MD  Encounter Date: 03/06/2017      PT End of Session - 03/06/17 1055    Visit Number 7   Number of Visits 9   Date for PT Re-Evaluation 03/15/17   Authorization Type MCR advantage   PT Start Time 1053   PT Stop Time 1132   PT Time Calculation (min) 39 min   Activity Tolerance Patient tolerated treatment well   Behavior During Therapy Anxious      Past Medical History:  Diagnosis Date  . Acute kidney failure 08/2008   "cleared up"  no problems since  . Allergy   . Anxiety   . Arthritis   . Asthma    ?of this no inhaler  . Blood transfusion   . CHF (congestive heart failure) ([place])    ?of this, pt denies  . CLOSTRIDIUM DIFFICILE COLITIS 07/04/2010   Annotation: 12/09, 2/10 Qualifier: Diagnosis of  By: [name], [name]    . Depression   . Depression with anxiety   . Duodenitis determined by biopsy 02/2016   peptic likely due to aleve (erosive gastropathy with duodenal erosions)  . ECZEMA 07/04/2010   Qualifier: Diagnosis of  By: [name], [name]    . GERD (gastroesophageal reflux disease)   . Heart attack ([place])    08/2008 (likely demand ischemia in the setting of MSSA sepsis/R TKA infection)  . History of hiatal hernia   . History of stomach ulcers   . Hypothyroidism   . Lower GI bleeding   . MALAR AND MAXILLARY BONES CLOSED FRACTURE 07/04/2010   Annotation: ORIF Qualifier: Diagnosis of  By: [name], [name]    . Migraine    "definitely"  . MRSA (methicillin resistant Staphylococcus aureus)    in leg, had to place steel rod in leg  . Personal history of colonic adenoma 06/01/2003  . PFO (patent foramen ovale)    small PFO by 08/2008 TEE  . Pneumonia  08/2008   "while in ICU"  . Seizures ([place]) 2009   "long time ago"    . Stroke [facility]) 2010   unable to complete sentences at times    Past Surgical History:  Procedure Laterality Date  . anterior  nerve transposition  07/2009   left ulnar nerve  . antibiotic spacer exchange  11/2008; 08/2006   right knee  . ARTHROTOMY  08/2008   right knee w/I&D  . CATARACT EXTRACTION    . COLONOSCOPY  11/2012   diverticulosis, rpt 10 yrs [name])  . ESOPHAGOGASTRODUODENOSCOPY  02/2016   erosive gastropathy with duodenal erosions [name])  . HARDWARE REMOVAL  04/2006   right knee w/antibiotic spacers placed  . INGUINAL HERNIA REPAIR  early 1990's   bilateral  . JOINT REPLACEMENT Right    rt knee  . KNEE ARTHROSCOPY  10/2001   right  . KNEE FUSION  03/2009   right knee removal; antibiotic spacers;   . REPLACEMENT TOTAL KNEE  08/2008; 09/2001   right  . rod placement Right   . SYNOVECTOMY  06/2005   debridement, liner exchange right knee  . TOE AMPUTATION Left 08/2008   great toe  . TOE AMPUTATION Left 04/2016   2nd toe [name]  . TONSILLECTOMY    . TOTAL  Tool Used (Outpatient Only) FOTO 51% ability (goal 60%), clinical judgement   Functional Limitation Mobility: Walking and moving around   Mobility: Walking and Moving Around Goal Status [phone]) At least 60 percent but less than 80 percent impaired, limited or restricted   Mobility: Walking and Moving Around Discharge Status [phone]) At least 40 percent but less than 60 percent impaired, limited or restricted      Problem List Patient Active Problem List   Diagnosis Date Noted  . S/P total knee arthroplasty, left 01/30/2017  . Arthritis of knee 01/15/2017  . Skin rash 12/10/2016  . Medicare annual wellness visit, initial 10/05/2016  . Advanced care planning/counseling discussion 10/05/2016  . Diarrhea 08/21/2016  . Obesity, Class II, BMI 35-39.9, with comorbidity 07/19/2016  . Hypothyroidism   . IDA (iron deficiency anemia) 04/23/2016  . Chronic pain syndrome 04/23/2016  . Inflammatory arthritis 04/23/2016  . Chronic leg pain 04/23/2016  . Lesion of palate 04/23/2016  . Grieving 04/23/2016  . GERD (gastroesophageal reflux disease)   . Duodenitis determined by biopsy 02/13/2016  . Diverticulosis of colon without hemorrhage 01/31/2016  . Migraine 01/11/2012  . History of methicillin resistant staphylococcus aureus (MRSA) 07/04/2010  . DEGENERATIVE DISC DISEASE 07/04/2010  . History of total right knee replacement 07/04/2010  . Osteoarthritis 06/09/2007  . Personal history of colonic adenoma 06/01/2003   PHYSICAL THERAPY DISCHARGE SUMMARY  Visits from Start of Care: 7  Current functional level related to goals / functional outcomes: See above   Remaining deficits: See above   Education / Equipment: Anatomy of condition, POC, HEP, exercise form/rationale  Plan: Patient agrees to discharge.  Patient goals were partially met. Patient is being  discharged due to meeting the stated rehab goals.  ?????    Corydon Schweiss C. Journey Castonguay PT, DPT 03/06/17 11:40 AM    [facility] [address] [place], [place], [address] Phone: [phone]   Fax:  [phone]  Name: Nicholas [name] MRN: [id number] Date of Birth: May 16, 1951  Tool Used (Outpatient Only) FOTO 51% ability (goal 60%), clinical judgement   Functional Limitation Mobility: Walking and moving around   Mobility: Walking and Moving Around Goal Status [phone]) At least 60 percent but less than 80 percent impaired, limited or restricted   Mobility: Walking and Moving Around Discharge Status [phone]) At least 40 percent but less than 60 percent impaired, limited or restricted      Problem List Patient Active Problem List   Diagnosis Date Noted  . S/P total knee arthroplasty, left 01/30/2017  . Arthritis of knee 01/15/2017  . Skin rash 12/10/2016  . Medicare annual wellness visit, initial 10/05/2016  . Advanced care planning/counseling discussion 10/05/2016  . Diarrhea 08/21/2016  . Obesity, Class II, BMI 35-39.9, with comorbidity 07/19/2016  . Hypothyroidism   . IDA (iron deficiency anemia) 04/23/2016  . Chronic pain syndrome 04/23/2016  . Inflammatory arthritis 04/23/2016  . Chronic leg pain 04/23/2016  . Lesion of palate 04/23/2016  . Grieving 04/23/2016  . GERD (gastroesophageal reflux disease)   . Duodenitis determined by biopsy 02/13/2016  . Diverticulosis of colon without hemorrhage 01/31/2016  . Migraine 01/11/2012  . History of methicillin resistant staphylococcus aureus (MRSA) 07/04/2010  . DEGENERATIVE DISC DISEASE 07/04/2010  . History of total right knee replacement 07/04/2010  . Osteoarthritis 06/09/2007  . Personal history of colonic adenoma 06/01/2003   PHYSICAL THERAPY DISCHARGE SUMMARY  Visits from Start of Care: 7  Current functional level related to goals / functional outcomes: See above   Remaining deficits: See above   Education / Equipment: Anatomy of condition, POC, HEP, exercise form/rationale  Plan: Patient agrees to discharge.  Patient goals were partially met. Patient is being  discharged due to meeting the stated rehab goals.  ?????    Corydon Schweiss C. Journey Castonguay PT, DPT 03/06/17 11:40 AM    [facility] [address] [place], [place], [address] Phone: [phone]   Fax:  [phone]  Name: Nicholas [name] MRN: [id number] Date of Birth: May 16, 1951

## 2017-03-07 ENCOUNTER — Other Ambulatory Visit: Payer: Self-pay | Admitting: Family Medicine

## 2017-03-08 ENCOUNTER — Ambulatory Visit (INDEPENDENT_AMBULATORY_CARE_PROVIDER_SITE_OTHER): Payer: PPO | Admitting: Family Medicine

## 2017-03-08 ENCOUNTER — Encounter: Payer: Self-pay | Admitting: Family Medicine

## 2017-03-08 VITALS — BP 136/88 | HR 91 | Temp 98.4°F | Resp 16 | Ht 69.0 in | Wt 251.5 lb

## 2017-03-08 DIAGNOSIS — Z8673 Personal history of transient ischemic attack (TIA), and cerebral infarction without residual deficits: Secondary | ICD-10-CM

## 2017-03-08 DIAGNOSIS — G894 Chronic pain syndrome: Secondary | ICD-10-CM

## 2017-03-08 DIAGNOSIS — Z96652 Presence of left artificial knee joint: Secondary | ICD-10-CM | POA: Diagnosis not present

## 2017-03-08 MED ORDER — LEVOTHYROXINE SODIUM 50 MCG PO TABS
50.0000 ug | ORAL_TABLET | Freq: Every day | ORAL | 3 refills | Status: DC
Start: 2017-03-08 — End: 2017-05-24

## 2017-03-08 MED ORDER — ASPIRIN EC 81 MG PO TBEC: 81.0000 mg | DELAYED_RELEASE_TABLET | Freq: Every day | ORAL | Status: DC

## 2017-03-08 NOTE — Progress Notes (Signed)
BP 136/88   Pulse 91   Temp 98.4 F (36.9 C) (Oral)   Resp 16   Ht 5\' 9"  (1.753 m)   Wt 251 lb 8 oz (114.1 kg)   SpO2 96%   BMI 37.14 kg/m    CC: 2 mo f/u visit  Subjective:    Patient ID: Nicholas [name], male    DOB: Jul 01, 1951, 66 y.o.   MRN: [id number]  HPI: Nicholas [name] is a 66 y.o. male presenting on 03/08/2017 for Knee Pain (bilateral) and Shoulder Pain (bilateral )   Recent L TKR by Dr [name] Sa, recovering well. Receiving hydrocodone from ortho x1 - last #30 on 02/27/2017. Planned 8 wk f/u.  Has not returned to see pain management Dr Humphrey Rolls.   He is worried about amitriptyline causing dementia and would like to stop - it hasn't helped with pain or with migraines.   H/o CVA. Has not been taking aspirin. Advised restart.   Relevant past medical, surgical, family and social history reviewed and updated as indicated. Interim medical history since our last visit reviewed. Allergies and medications reviewed and updated. Outpatient Medications Prior to Visit  Medication Sig Dispense Refill  . amitriptyline (ELAVIL) 25 MG tablet TAKE 3 TABLETS (75 MG TOTAL) BY MOUTH AT BEDTIME. 90 tablet 1  . EPINEPHrine (EPIPEN 2-PAK) 0.3 mg/0.3 mL IJ SOAJ injection Inject 0.3 mg into the muscle as directed.     . ferrous gluconate (IRON 27) 240 (27 FE) MG tablet Take 240 mg by mouth daily.    [name] gabapentin (NEURONTIN) 300 MG capsule TAKE ONE CAPSULE BY MOUTH 4 TIMES A DAY 360 capsule 0  . HYDROcodone-acetaminophen (NORCO/VICODIN) 5-325 MG tablet Take 1 tablet by mouth every 12 (twelve) hours as needed for moderate pain. 30 tablet 0  . hydrocortisone 2.5 % cream Apply 1 application topically at bedtime as needed (for rash).     . Multiple Vitamin (MULTIVITAMIN WITH MINERALS) TABS tablet Take 1 tablet by mouth daily. Centrum silver.    . pantoprazole (PROTONIX) 40 MG tablet Take 1 tablet (40 mg total) by mouth daily before breakfast. 30 tablet 11  . SUMAtriptan (IMITREX) 50 MG tablet Take 1 tablet (50  mg total) by mouth once. May repeat in 2 hours if headache persists or recurs. 12 tablet 3  . HYDROcodone-acetaminophen (NORCO) 10-325 MG tablet Take 1 tablet by mouth every 4 (four) hours as needed (breakthrough pain). 60 tablet 0  . levothyroxine (SYNTHROID, LEVOTHROID) 25 MCG tablet Take 2 tablets (50 mcg total) by mouth daily before breakfast.    . aspirin EC 81 MG tablet Take 81 mg by mouth daily.     No facility-administered medications prior to visit.      Per HPI unless specifically indicated in ROS section below Review of Systems     Objective:    BP 136/88   Pulse 91   Temp 98.4 F (36.9 C) (Oral)   Resp 16   Ht 5\' 9"  (1.753 m)   Wt 251 lb 8 oz (114.1 kg)   SpO2 96%   BMI 37.14 kg/m   Wt Readings from Last 3 Encounters:  03/08/17 251 lb 8 oz (114.1 kg)  02/21/17 248 lb 12.8 oz (112.9 kg)  01/07/17 266 lb 4 oz (120.8 kg)    Physical Exam  Constitutional: He appears well-developed and well-nourished. No distress.  Musculoskeletal: He exhibits no edema.  L knee incision healing well Chronic extension R knee prior fusion  Nursing note and vitals  reviewed.      Assessment & Plan:   Problem List Items Addressed This Visit    Chronic pain syndrome - Primary    Amitriptyline not really helping with chronic pain or with migraines - interested in taper - will decrease as per instructions. Encouraged return to pain management Dr Humphrey Rolls for f/u.       History of CVA in adulthood    Encouraged he restart aspirin 81mg  daily      S/P total knee arthroplasty, left    Recovering well           Follow up plan: No Follow-up on file.  [name], MD

## 2017-03-08 NOTE — Assessment & Plan Note (Signed)
Encouraged he restart aspirin 81mg  daily

## 2017-03-08 NOTE — Assessment & Plan Note (Addendum)
Amitriptyline not really helping with chronic pain or with migraines - interested in taper - will decrease as per instructions. Encouraged return to pain management Dr Humphrey Rolls for f/u.

## 2017-03-08 NOTE — Assessment & Plan Note (Signed)
Recovering well 

## 2017-03-08 NOTE — Patient Instructions (Addendum)
Ok to taper off amitriptyline - decrease to 50mg  nightly for 2 weeks then down to 25mg  nightly for 2 weeks then ok to stop.  Schedule follow up with Dr Humphrey Rolls.  Restart aspirin 81mg  daily.

## 2017-03-14 ENCOUNTER — Encounter: Payer: PPO | Admitting: Physical Therapy

## 2017-03-18 DIAGNOSIS — L218 Other seborrheic dermatitis: Secondary | ICD-10-CM | POA: Diagnosis not present

## 2017-03-21 DIAGNOSIS — D509 Iron deficiency anemia, unspecified: Secondary | ICD-10-CM | POA: Diagnosis not present

## 2017-03-21 DIAGNOSIS — E559 Vitamin D deficiency, unspecified: Secondary | ICD-10-CM | POA: Diagnosis not present

## 2017-03-21 DIAGNOSIS — E032 Hypothyroidism due to medicaments and other exogenous substances: Secondary | ICD-10-CM | POA: Diagnosis not present

## 2017-03-21 DIAGNOSIS — M129 Arthropathy, unspecified: Secondary | ICD-10-CM | POA: Diagnosis not present

## 2017-03-24 ENCOUNTER — Other Ambulatory Visit: Payer: Self-pay | Admitting: Family Medicine

## 2017-03-25 NOTE — Telephone Encounter (Signed)
Rx written 11/19/16 w/ 0 refills.  02/21/17 note states con't for now, not mentioned in most recent note. Please advise.

## 2017-04-01 DIAGNOSIS — G894 Chronic pain syndrome: Secondary | ICD-10-CM | POA: Diagnosis not present

## 2017-04-01 DIAGNOSIS — R51 Headache: Secondary | ICD-10-CM | POA: Diagnosis not present

## 2017-04-01 DIAGNOSIS — M25569 Pain in unspecified knee: Secondary | ICD-10-CM | POA: Diagnosis not present

## 2017-04-01 DIAGNOSIS — F1120 Opioid dependence, uncomplicated: Secondary | ICD-10-CM | POA: Diagnosis not present

## 2017-04-01 DIAGNOSIS — Z79899 Other long term (current) drug therapy: Secondary | ICD-10-CM | POA: Diagnosis not present

## 2017-04-01 DIAGNOSIS — M25512 Pain in left shoulder: Secondary | ICD-10-CM | POA: Diagnosis not present

## 2017-04-01 DIAGNOSIS — G8929 Other chronic pain: Secondary | ICD-10-CM | POA: Diagnosis not present

## 2017-04-01 DIAGNOSIS — M25511 Pain in right shoulder: Secondary | ICD-10-CM | POA: Diagnosis not present

## 2017-04-01 DIAGNOSIS — G8928 Other chronic postprocedural pain: Secondary | ICD-10-CM | POA: Diagnosis not present

## 2017-04-29 ENCOUNTER — Ambulatory Visit (INDEPENDENT_AMBULATORY_CARE_PROVIDER_SITE_OTHER): Payer: PPO | Admitting: Orthopedic Surgery

## 2017-04-29 ENCOUNTER — Encounter (INDEPENDENT_AMBULATORY_CARE_PROVIDER_SITE_OTHER): Payer: Self-pay | Admitting: Orthopedic Surgery

## 2017-04-29 DIAGNOSIS — Z96652 Presence of left artificial knee joint: Secondary | ICD-10-CM

## 2017-04-29 DIAGNOSIS — F1120 Opioid dependence, uncomplicated: Secondary | ICD-10-CM | POA: Diagnosis not present

## 2017-04-29 DIAGNOSIS — M25569 Pain in unspecified knee: Secondary | ICD-10-CM | POA: Diagnosis not present

## 2017-04-29 DIAGNOSIS — M25512 Pain in left shoulder: Secondary | ICD-10-CM | POA: Diagnosis not present

## 2017-04-29 DIAGNOSIS — M25511 Pain in right shoulder: Secondary | ICD-10-CM | POA: Diagnosis not present

## 2017-04-29 DIAGNOSIS — Z79899 Other long term (current) drug therapy: Secondary | ICD-10-CM | POA: Diagnosis not present

## 2017-04-29 DIAGNOSIS — G8928 Other chronic postprocedural pain: Secondary | ICD-10-CM | POA: Diagnosis not present

## 2017-04-29 DIAGNOSIS — G894 Chronic pain syndrome: Secondary | ICD-10-CM | POA: Diagnosis not present

## 2017-04-29 DIAGNOSIS — G8929 Other chronic pain: Secondary | ICD-10-CM | POA: Diagnosis not present

## 2017-04-29 DIAGNOSIS — R51 Headache: Secondary | ICD-10-CM | POA: Diagnosis not present

## 2017-04-29 NOTE — Progress Notes (Signed)
Post-Op Visit Note   Patient: Nicholas [name]           Date of Birth: [birth year]           MRN: [id number] Visit Date: 04/29/2017 PCP: Nicholas Bush, MD   Assessment & Plan:  Chief Complaint:  Chief Complaint  Patient presents with  . Left Knee - Follow-up   Visit Diagnoses: No diagnosis found.  Plan: Nicholas [name] is about 3 months out left total knee replacement.  He's doing well.  Not having any problems.  Still has some right knee pain from his fusion which is understandable.  Also has bilateral shoulder arthritis.  He is in pain management.  No history of rotator cuff surgery.  On exam he has full extension and 95-100 of flexion easily with the left knee.  No effusion is present.  Plan at this time is for him to continue with his treatment and rehabilitation for his left knee.  He'll let me know if he wants to get his shoulders replaced.  He has severe arthritis in both shoulders.  Follow-Up Instructions: No Follow-up on file.   Orders:  No orders of the defined types were placed in this encounter.  No orders of the defined types were placed in this encounter.   Imaging: No results found.  PMFS History: Patient Active Problem List   Diagnosis Date Noted  . History of CVA in adulthood 03/08/2017  . S/P total knee arthroplasty, left 01/30/2017  . Arthritis of knee 01/15/2017  . Skin rash 12/10/2016  . Medicare annual wellness visit, initial 10/05/2016  . Advanced care planning/counseling discussion 10/05/2016  . Diarrhea 08/21/2016  . Obesity, Class II, BMI 35-39.9, with comorbidity 07/19/2016  . Hypothyroidism   . IDA (iron deficiency anemia) 04/23/2016  . Chronic pain syndrome 04/23/2016  . Inflammatory arthritis 04/23/2016  . Chronic leg pain 04/23/2016  . Lesion of palate 04/23/2016  . Grieving 04/23/2016  . GERD (gastroesophageal reflux disease)   . Duodenitis determined by biopsy 02/13/2016  . Diverticulosis of colon without hemorrhage 01/31/2016  .  Migraine 01/11/2012  . History of methicillin resistant staphylococcus aureus (MRSA) 07/04/2010  . DEGENERATIVE DISC DISEASE 07/04/2010  . History of total right knee replacement 07/04/2010  . Osteoarthritis 06/09/2007  . Personal history of colonic adenoma 06/01/2003   Past Medical History:  Diagnosis Date  . Acute kidney failure 08/2008   "cleared up"  no problems since  . Allergy   . Anxiety   . Arthritis   . Asthma    ?of this no inhaler  . Blood transfusion   . CHF (congestive heart failure) ([place])    ?of this, pt denies  . CLOSTRIDIUM DIFFICILE COLITIS 07/04/2010   Annotation: 12/09, 2/10 Qualifier: Diagnosis of  By: Nicholas Salon MD, Nicholas [name]    . Depression   . Depression with anxiety   . Duodenitis determined by biopsy 02/2016   peptic likely due to aleve (erosive gastropathy with duodenal erosions)  . ECZEMA 07/04/2010   Qualifier: Diagnosis of  By: Nicholas Salon MD, Nicholas [name]    . GERD (gastroesophageal reflux disease)   . Heart attack ([place])    08/2008 (likely demand ischemia in the setting of MSSA sepsis/R TKA infection)  . History of hiatal hernia   . History of stomach ulcers   . Hypothyroidism   . Lower GI bleeding   . MALAR AND MAXILLARY BONES CLOSED FRACTURE 07/04/2010   Annotation: ORIF Qualifier: Diagnosis of  By: Nicholas Salon MD, Nicholas [name]    .  Migraine    "definitely"  . MRSA (methicillin resistant Staphylococcus aureus)    in leg, had to place steel rod in leg  . Personal history of colonic adenoma 06/01/2003  . PFO (patent foramen ovale)    small PFO by 08/2008 TEE  . Pneumonia 08/2008   "while in ICU"  . Seizures ([place]) 2009   "long time ago"    . Stroke [facility]) 2010   unable to complete sentences at times    Family History  Problem Relation Age of Onset  . Coronary artery disease Father   . Heart disease Mother   . Breast cancer Sister   . Diabetes Brother   . Diabetes Sister   . Colon cancer Neg Hx   . Stomach cancer Neg Hx     Past Surgical History:  Procedure  Laterality Date  . anterior  nerve transposition  07/2009   left ulnar nerve  . antibiotic spacer exchange  11/2008; 08/2006   right knee  . ARTHROTOMY  08/2008   right knee w/I&D  . CATARACT EXTRACTION    . COLONOSCOPY  11/2012   diverticulosis, rpt 10 yrs Nicholas [name])  . ESOPHAGOGASTRODUODENOSCOPY  02/2016   erosive gastropathy with duodenal erosions Nicholas [name])  . HARDWARE REMOVAL  04/2006   right knee w/antibiotic spacers placed  . INGUINAL HERNIA REPAIR  early 1990's   bilateral  . JOINT REPLACEMENT Right    rt knee  . KNEE ARTHROSCOPY  10/2001   right  . KNEE FUSION  03/2009   right knee removal; antibiotic spacers;   . REPLACEMENT TOTAL KNEE  08/2008; 09/2001   right  . rod placement Right   . SYNOVECTOMY  06/2005   debridement, liner exchange right knee  . TOE AMPUTATION Left 08/2008   great toe  . TOE AMPUTATION Left 04/2016   2nd toe Nicholas [name])  . TONSILLECTOMY    . TOTAL KNEE ARTHROPLASTY Left 01/15/2017  . TOTAL KNEE ARTHROPLASTY Left 01/15/2017   Procedure: TOTAL KNEE ARTHROPLASTY;  Surgeon: Nicholas Pel, MD;  Location: [place];  Service: Orthopedics;  Laterality: Left;   Social History   Occupational History  . retired    Social History Main Topics  . Smoking status: Never Smoker  . Smokeless tobacco: Never Used  . Alcohol use No     Comment: "I abused alcohol; last drink  ~ 2005"  . Drug use: No  . Sexual activity: No

## 2017-05-12 ENCOUNTER — Other Ambulatory Visit: Payer: Self-pay | Admitting: Family Medicine

## 2017-05-24 ENCOUNTER — Ambulatory Visit (INDEPENDENT_AMBULATORY_CARE_PROVIDER_SITE_OTHER): Payer: PPO | Admitting: Orthopaedic Surgery

## 2017-05-24 ENCOUNTER — Ambulatory Visit (INDEPENDENT_AMBULATORY_CARE_PROVIDER_SITE_OTHER): Payer: PPO

## 2017-05-24 ENCOUNTER — Encounter (INDEPENDENT_AMBULATORY_CARE_PROVIDER_SITE_OTHER): Payer: Self-pay | Admitting: Orthopaedic Surgery

## 2017-05-24 ENCOUNTER — Encounter: Payer: Self-pay | Admitting: Family Medicine

## 2017-05-24 ENCOUNTER — Ambulatory Visit (INDEPENDENT_AMBULATORY_CARE_PROVIDER_SITE_OTHER): Payer: PPO | Admitting: Family Medicine

## 2017-05-24 VITALS — BP 114/60 | HR 81 | Temp 98.0°F | Resp 16 | Ht 69.0 in | Wt 262.0 lb

## 2017-05-24 DIAGNOSIS — M25512 Pain in left shoulder: Secondary | ICD-10-CM

## 2017-05-24 DIAGNOSIS — G8929 Other chronic pain: Secondary | ICD-10-CM | POA: Diagnosis not present

## 2017-05-24 DIAGNOSIS — M19012 Primary osteoarthritis, left shoulder: Secondary | ICD-10-CM

## 2017-05-24 DIAGNOSIS — M150 Primary generalized (osteo)arthritis: Secondary | ICD-10-CM

## 2017-05-24 DIAGNOSIS — E039 Hypothyroidism, unspecified: Secondary | ICD-10-CM | POA: Diagnosis not present

## 2017-05-24 DIAGNOSIS — M1990 Unspecified osteoarthritis, unspecified site: Secondary | ICD-10-CM | POA: Diagnosis not present

## 2017-05-24 DIAGNOSIS — M25511 Pain in right shoulder: Secondary | ICD-10-CM

## 2017-05-24 DIAGNOSIS — M159 Polyosteoarthritis, unspecified: Secondary | ICD-10-CM

## 2017-05-24 DIAGNOSIS — G894 Chronic pain syndrome: Secondary | ICD-10-CM

## 2017-05-24 DIAGNOSIS — F4320 Adjustment disorder, unspecified: Secondary | ICD-10-CM

## 2017-05-24 DIAGNOSIS — M19011 Primary osteoarthritis, right shoulder: Secondary | ICD-10-CM

## 2017-05-24 DIAGNOSIS — F4321 Adjustment disorder with depressed mood: Secondary | ICD-10-CM

## 2017-05-24 LAB — TSH: TSH: 9.84 mIU/L — ABNORMAL HIGH (ref 0.40–4.50)

## 2017-05-24 LAB — T4, FREE: FREE T4: 1 ng/dL (ref 0.8–1.8)

## 2017-05-24 NOTE — Assessment & Plan Note (Signed)
Upcoming f/u with Dr Humphrey Rolls. Tried and failed cymbalta, amitriptyline.

## 2017-05-24 NOTE — Assessment & Plan Note (Signed)
Recurrent grieving - poor social support network. Pt interested in referral back to counselor - will refer.

## 2017-05-24 NOTE — Assessment & Plan Note (Signed)
Update TFTs today. Currently on 73mcg levothyroxine daily.

## 2017-05-24 NOTE — Patient Instructions (Addendum)
Labs today.  Continue current medicines, updated list provided today.  Return as needed or in 4 months for medicare wellness visit with Katha Cabal and physical with me.  We will refer you to counseling

## 2017-05-24 NOTE — Progress Notes (Signed)
Office Visit Note   Patient: Nicholas [name]           Date of Birth: [birth year]           MRN: [id number] Visit Date: 05/24/2017              Requested by: [name], MD [address] [place], [place] [address] PCP: [name], MD   Assessment & Plan: Visit Diagnoses:  1. Chronic pain of both shoulders   2. Arthritis of right shoulder region   3. Arthritis of left shoulder region     Plan: Patient has end-stage bilateral shoulder osteoarthritis. At this point he is going to make an appointment with Dr. [name] Sa and discussed shoulder arthroplasty. Questions encouraged and answered. Follow-up as needed.  Follow-Up Instructions: Return if symptoms worsen or fail to improve.   Orders:  Orders Placed This Encounter  Procedures  . XR Shoulder Left  . XR Shoulder Right   No orders of the defined types were placed in this encounter.     Procedures: No procedures performed   Clinical Data: No additional findings.   Subjective: Chief Complaint  Patient presents with  . Left Shoulder - Pain  . Right Shoulder - Pain    [name] comes back today for bilateral shoulder pain. He has known degenerative joint disease of bilateral shoulders. He's leaning towards having them replaced. He takes hydrocodone which helps temporarily. He is currently in a pain management clinic. Denies any injuries    Review of Systems  Constitutional: Negative.   All other systems reviewed and are negative.    Objective: Vital Signs: There were no vitals taken for this visit.  Physical Exam  Constitutional: He is oriented to person, place, and time. He appears well-developed and well-nourished.  Pulmonary/Chest: Effort normal.  Abdominal: Soft.  Neurological: He is alert and oriented to person, place, and time.  Skin: Skin is warm.  Psychiatric: He has a normal mood and affect. His behavior is normal. Judgment and thought content normal.  Nursing note and vitals  reviewed.   Ortho Exam Bilateral shoulder exam shows very limited motion with significant grinding and crepitus and pain. Specialty Comments:  No specialty comments available.  Imaging: No results found.   PMFS History: Patient Active Problem List   Diagnosis Date Noted  . History of CVA in adulthood 03/08/2017  . S/P total knee arthroplasty, left 01/30/2017  . Arthritis of knee 01/15/2017  . Skin rash 12/10/2016  . Medicare annual wellness visit, initial 10/05/2016  . Advanced care planning/counseling discussion 10/05/2016  . Diarrhea 08/21/2016  . Obesity, Class II, BMI 35-39.9, with comorbidity 07/19/2016  . Hypothyroidism   . IDA (iron deficiency anemia) 04/23/2016  . Chronic pain syndrome 04/23/2016  . Inflammatory arthritis 04/23/2016  . Chronic leg pain 04/23/2016  . Lesion of palate 04/23/2016  . Grieving 04/23/2016  . GERD (gastroesophageal reflux disease)   . Duodenitis determined by biopsy 02/13/2016  . Diverticulosis of colon without hemorrhage 01/31/2016  . Migraine 01/11/2012  . History of methicillin resistant staphylococcus aureus (MRSA) 07/04/2010  . DEGENERATIVE DISC DISEASE 07/04/2010  . History of total right knee replacement 07/04/2010  . Osteoarthritis 06/09/2007  . Personal history of colonic adenoma 06/01/2003   Past Medical History:  Diagnosis Date  . Acute kidney failure 08/2008   "cleared up"  no problems since  . Allergy   . Anxiety   . Arthritis   . Asthma    ?of this no inhaler  .  Blood transfusion   . CHF (congestive heart failure) ([place])    ?of this, pt denies  . CLOSTRIDIUM DIFFICILE COLITIS 07/04/2010   Annotation: 12/09, 2/10 Qualifier: Diagnosis of  By: [name], [name]    . Depression   . Depression with anxiety   . Duodenitis determined by biopsy 02/2016   peptic likely due to aleve (erosive gastropathy with duodenal erosions)  . ECZEMA 07/04/2010   Qualifier: Diagnosis of  By: [name], [name]    . GERD (gastroesophageal  reflux disease)   . Heart attack ([place])    08/2008 (likely demand ischemia in the setting of MSSA sepsis/R TKA infection)  . History of hiatal hernia   . History of stomach ulcers   . Hypothyroidism   . Lower GI bleeding   . MALAR AND MAXILLARY BONES CLOSED FRACTURE 07/04/2010   Annotation: ORIF Qualifier: Diagnosis of  By: [name], [name]    . Migraine    "definitely"  . MRSA (methicillin resistant Staphylococcus aureus)    in leg, had to place steel rod in leg  . Personal history of colonic adenoma 06/01/2003  . PFO (patent foramen ovale)    small PFO by 08/2008 TEE  . Pneumonia 08/2008   "while in ICU"  . Seizures ([place]) 2009   "long time ago"    . Stroke [facility]) 2010   unable to complete sentences at times    Family History  Problem Relation Age of Onset  . Coronary artery disease Father   . Heart disease Mother   . Breast cancer Sister   . Diabetes Brother   . Diabetes Sister   . Colon cancer Neg Hx   . Stomach cancer Neg Hx     Past Surgical History:  Procedure Laterality Date  . anterior  nerve transposition  07/2009   left ulnar nerve  . antibiotic spacer exchange  11/2008; 08/2006   right knee  . ARTHROTOMY  08/2008   right knee w/I&D  . CATARACT EXTRACTION    . COLONOSCOPY  11/2012   diverticulosis, rpt 10 yrs [name])  . ESOPHAGOGASTRODUODENOSCOPY  02/2016   erosive gastropathy with duodenal erosions [name])  . HARDWARE REMOVAL  04/2006   right knee w/antibiotic spacers placed  . INGUINAL HERNIA REPAIR  early 1990's   bilateral  . JOINT REPLACEMENT Right    rt knee  . KNEE ARTHROSCOPY  10/2001   right  . KNEE FUSION  03/2009   right knee removal; antibiotic spacers;   . REPLACEMENT TOTAL KNEE  08/2008; 09/2001   right  . rod placement Right   . SYNOVECTOMY  06/2005   debridement, liner exchange right knee  . TOE AMPUTATION Left 08/2008   great toe  . TOE AMPUTATION Left 04/2016   2nd toe [name]  . TONSILLECTOMY    . TOTAL KNEE ARTHROPLASTY Left  01/15/2017  . TOTAL KNEE ARTHROPLASTY Left 01/15/2017   Procedure: TOTAL KNEE ARTHROPLASTY;  Surgeon: Meredith Pel, MD;  Location: [place];  Service: Orthopedics;  Laterality: Left;   Social History   Occupational History  . retired    Social History Main Topics  . Smoking status: Never Smoker  . Smokeless tobacco: Never Used  . Alcohol use No     Comment: "I abused alcohol; last drink  ~ 2005"  . Drug use: No  . Sexual activity: No

## 2017-05-24 NOTE — Assessment & Plan Note (Signed)
Chronic, severe.  

## 2017-05-24 NOTE — Progress Notes (Signed)
BP 114/60   Pulse 81   Temp 98 F (36.7 C) (Oral)   Resp 16   Ht 5\' 9"  (1.753 m)   Wt 262 lb (118.8 kg)   SpO2 96%   BMI 38.69 kg/m    CC: shoulder pain Subjective:    Patient ID: Nicholas [name], male    DOB: [birth year], 66 y.o.   MRN: [id number]  HPI: Nicholas [name] is a 66 y.o. male presenting on 05/24/2017 for Follow-up (Shoulder pain- bilateral radiating down hands causing a dull and ache pain, Knee- left side seems to be improving )   Struggling with wife's death 11-17-2015. Increased financial stressors. He would like referral to counselor. He does go to church but doesn't have good support network.   Last visit we discussed amitriptyline taper - he is down to 25mg  daily.  Continues gabapentin at lower dose (300mg  bid) and hydrocodone 1 in am and 1/2 at night. He is also using imitrex PRN for pain. Has f/u later this month with Dr Humphrey Rolls.   Bilateral shoulder pain "bone on bone" with radiation down arms into hands. R thumb also very tender, known osteoarthritis. Thinking about surgery in September.   He has been taking levothyroxine 83mcg daily for the last few months.  Lab Results  Component Value Date   TSH 0.04 (L) 09/26/2016     Relevant past medical, surgical, family and social history reviewed and updated as indicated. Interim medical history since our last visit reviewed. Allergies and medications reviewed and updated. Outpatient Medications Prior to Visit  Medication Sig Dispense Refill  . aspirin EC 81 MG tablet Take 1 tablet (81 mg total) by mouth daily.    [name] EPINEPHrine (EPIPEN 2-PAK) 0.3 mg/0.3 mL IJ SOAJ injection Inject 0.3 mg into the muscle as directed.     . ferrous gluconate (IRON 27) 240 (27 FE) MG tablet Take 240 mg by mouth daily.    [name] HYDROcodone-acetaminophen (NORCO/VICODIN) 5-325 MG tablet Take 1 tablet by mouth every 12 (twelve) hours as needed for moderate pain. 30 tablet 0  . hydrocortisone 2.5 % cream Apply 1 application topically at bedtime as  needed (for rash).     [name] levothyroxine (SYNTHROID, LEVOTHROID) 25 MCG tablet TAKE 1 TABLET (25 MCG TOTAL) BY MOUTH DAILY BEFORE BREAKFAST. 90 tablet 1  . Multiple Vitamin (MULTIVITAMIN WITH MINERALS) TABS tablet Take 1 tablet by mouth daily. Centrum silver.    . pantoprazole (PROTONIX) 40 MG tablet Take 1 tablet (40 mg total) by mouth daily before breakfast. 30 tablet 11  . amitriptyline (ELAVIL) 25 MG tablet TAKE 3 TABLETS (75 MG TOTAL) BY MOUTH AT BEDTIME. (Patient taking differently: Take 25 mg by mouth at bedtime. ) 270 tablet 1  . gabapentin (NEURONTIN) 300 MG capsule TAKE ONE CAPSULE BY MOUTH 4 TIMES A DAY 360 capsule 1  . SUMAtriptan (IMITREX) 50 MG tablet Take 1 tablet (50 mg total) by mouth once. May repeat in 2 hours if headache persists or recurs. 12 tablet 3  . levothyroxine (SYNTHROID, LEVOTHROID) 50 MCG tablet Take 1 tablet (50 mcg total) by mouth daily before breakfast. 30 tablet 3   No facility-administered medications prior to visit.      Per HPI unless specifically indicated in ROS section below Review of Systems     Objective:    BP 114/60   Pulse 81   Temp 98 F (36.7 C) (Oral)   Resp 16   Ht 5\' 9"  (1.753 m)   Wt  262 lb (118.8 kg)   SpO2 96%   BMI 38.69 kg/m   Wt Readings from Last 3 Encounters:  05/24/17 262 lb (118.8 kg)  03/08/17 251 lb 8 oz (114.1 kg)  02/21/17 248 lb 12.8 oz (112.9 kg)    Physical Exam  Constitutional: He appears well-developed and well-nourished. No distress.  HENT:  Mouth/Throat: Oropharynx is clear and moist. No oropharyngeal exudate.  Neck: No thyromegaly present.  Cardiovascular: Normal rate, regular rhythm, normal heart sounds and intact distal pulses.   No murmur heard. Pulmonary/Chest: Effort normal and breath sounds normal. No respiratory distress. He has no wheezes. He has no rales.  Musculoskeletal: He exhibits no edema.  Lymphadenopathy:    He has no cervical adenopathy.  Skin: Skin is warm and dry. No rash noted.    Psychiatric: He has a normal mood and affect. His behavior is normal.  Nursing note and vitals reviewed.  Results for orders placed or performed during the hospital encounter of 01/15/17  CBC  Result Value Ref Range   WBC 16.0 (H) 4.0 - 10.5 K/uL   RBC 4.33 4.22 - 5.81 MIL/uL   Hemoglobin 12.6 (L) 13.0 - 17.0 g/dL   HCT 39.1 39.0 - 52.0 %   MCV 90.3 78.0 - 100.0 fL   MCH 29.1 26.0 - 34.0 pg   MCHC 32.2 30.0 - 36.0 g/dL   RDW 13.8 11.5 - 15.5 %   Platelets 181 150 - 400 K/uL      Assessment & Plan:   Problem List Items Addressed This Visit    Bilateral shoulder pain    Presumed due to osteoarthritis/inflammatory arthritis. Consider surgery next month.       Chronic pain syndrome - Primary    Upcoming f/u with Dr Humphrey Rolls. Tried and failed cymbalta, amitriptyline.       Grieving    Recurrent grieving - poor social support network. Pt interested in referral back to counselor - will refer.       Relevant Orders   Ambulatory referral to Psychology   Hypothyroidism    Update TFTs today. Currently on 24mcg levothyroxine daily.       Relevant Orders   TSH   T4, free   Inflammatory arthritis    Chronic, severe. Sees ortho/rheum      Osteoarthritis    Chronic, severe.           Follow up plan: Return in about 4 months (around 09/23/2017) for annual exam, prior fasting for blood work, medicare wellness visit.  [name], MD

## 2017-05-24 NOTE — Assessment & Plan Note (Signed)
Presumed due to osteoarthritis/inflammatory arthritis. Consider surgery next month.

## 2017-05-24 NOTE — Assessment & Plan Note (Addendum)
Chronic, severe. Sees ortho/rheum

## 2017-05-25 ENCOUNTER — Other Ambulatory Visit: Payer: Self-pay | Admitting: Family Medicine

## 2017-05-25 MED ORDER — LEVOTHYROXINE SODIUM 25 MCG PO TABS: 25.0000 ug | ORAL_TABLET | Freq: Every day | ORAL | 1 refills | Status: DC

## 2017-05-27 ENCOUNTER — Telehealth: Payer: Self-pay | Admitting: Family Medicine

## 2017-05-27 NOTE — Telephone Encounter (Signed)
Caller Name:Steve Zaragoza Relationship to Patient:self Best [phone] Pharmacy:  Reason for call:  Returning call about labs

## 2017-06-03 ENCOUNTER — Ambulatory Visit (INDEPENDENT_AMBULATORY_CARE_PROVIDER_SITE_OTHER): Payer: PPO

## 2017-06-03 ENCOUNTER — Encounter (INDEPENDENT_AMBULATORY_CARE_PROVIDER_SITE_OTHER): Payer: Self-pay | Admitting: Orthopaedic Surgery

## 2017-06-03 ENCOUNTER — Ambulatory Visit (INDEPENDENT_AMBULATORY_CARE_PROVIDER_SITE_OTHER): Payer: PPO | Admitting: Orthopaedic Surgery

## 2017-06-03 VITALS — BP 151/98 | HR 86 | Ht 69.0 in | Wt 264.0 lb

## 2017-06-03 DIAGNOSIS — M545 Low back pain: Secondary | ICD-10-CM

## 2017-06-03 MED ORDER — PREDNISONE 10 MG PO TABS: 10.0000 mg | ORAL_TABLET | Freq: Every day | ORAL | 0 refills | Status: DC

## 2017-06-03 NOTE — Progress Notes (Signed)
Office Visit Note   Patient: Nicholas [name]           Date of Birth: Jun 07, 1951           MRN: [id number] Visit Date: 06/03/2017              Requested by: [name], MD [address] [place], [place] [address] PCP: [name], MD   Assessment & Plan: Visit Diagnoses:  1. Acute bilateral low back pain, with sciatica presence unspecified     Plan: We'll place him on a prednisone Dosepak 10 mg. He'll return if he has persistent symptoms. He likely has some lateral recess narrowing consistent with the symptoms. He had a prescription given on 05/29/2017 for hydrocodone 10 which he has for pain. We discussed a walking program. X-rays demonstrated multilevel disc degeneration in the lumbar region. He has no claudication symptoms.  Follow-Up Instructions: Return if symptoms worsen or fail to improve.   Orders:  Orders Placed This Encounter  Procedures  . XR Lumbar Spine 2-3 Views   Meds ordered this encounter  Medications  . predniSONE (DELTASONE) 10 MG tablet    Sig: Take 1 tablet (10 mg total) by mouth daily with breakfast.    Dispense:  21 tablet    Refill:  0      Procedures: No procedures performed   Clinical Data: No additional findings.   Subjective: Chief Complaint  Patient presents with  . Lower Back - Pain    HPI 66 year old males had problems with back pain and left leg pain that started on Friday. He states it radiates down to his mid thigh he has history of disc degeneration. Has osteoarthritis of the shoulder and is seeing Dr. [name] Sa about possible total shoulder arthroplasty. Previous total knee arthroplasty doing well. He's used Aleve, but gabapentin, amitriptyline, hydrocodone 10/325 without relief. He denies fever chills no associated bowel or bladder symptoms. Has increased BMI with height 59 and weight 264. He denies symptoms that radiate down past the knee.  Review of Systems U systems positive for history of MRSA. History of total  knee arthroplasty. Diverticulitis. He's had chronic pain. Obesity, hypothyroidism. Lumbar degenerative disc disease mild scoliosis. Positive for shoulder osteoarthritis bilaterally.   Objective: Vital Signs: BP (!) 151/98   Pulse 86   Ht 5\' 9"  (1.753 m)   Wt 264 lb (119.7 kg)   BMI 38.99 kg/m   Physical Exam  Constitutional: He is oriented to person, place, and time. He appears well-developed and well-nourished.  HENT:  Head: Normocephalic and atraumatic.  Eyes: Pupils are equal, round, and reactive to light. EOM are normal.  Neck: No tracheal deviation present. No thyromegaly present.  Cardiovascular: Normal rate.   Pulmonary/Chest: Effort normal. He has no wheezes.  Abdominal: Soft. Bowel sounds are normal.  Musculoskeletal:  Patient has well-healed total knee arthroplasty. Reflexes are 2+ and symmetrical no hip flexion weakness no pain with logrolling of the hips. He has some sciatic notch tenderness more on the left than right. Anterior tib EHL gastrocsoleus is strong. Distal pulses palpable. Sensory to the foot is intact.  Neurological: He is alert and oriented to person, place, and time.  Skin: Skin is warm and dry. Capillary refill takes less than 2 seconds.  Psychiatric: He has a normal mood and affect. His behavior is normal. Judgment and thought content normal.    Ortho Exam  Specialty Comments:  No specialty comments available.  Imaging: AP lateral lumbar x-rays obtained show some  mild lumbar curvature. Facet degenerative changes with narrowing at L3-4, L4-5, and L5-S1 with no more than 1 mm listhesis. Previous right femoral nail noted with proximal interlock.   PMFS History: Patient Active Problem List   Diagnosis Date Noted  . Bilateral shoulder pain 05/24/2017  . History of CVA in adulthood 03/08/2017  . S/P total knee arthroplasty, left 01/30/2017  . Arthritis of knee 01/15/2017  . Skin rash 12/10/2016  . Medicare annual wellness visit, initial 10/05/2016    . Advanced care planning/counseling discussion 10/05/2016  . Diarrhea 08/21/2016  . Obesity, Class II, BMI 35-39.9, with comorbidity 07/19/2016  . Hypothyroidism   . IDA (iron deficiency anemia) 04/23/2016  . Chronic pain syndrome 04/23/2016  . Inflammatory arthritis 04/23/2016  . Chronic leg pain 04/23/2016  . Lesion of palate 04/23/2016  . Grieving 04/23/2016  . GERD (gastroesophageal reflux disease)   . Duodenitis determined by biopsy 02/13/2016  . Diverticulosis of colon without hemorrhage 01/31/2016  . Migraine 01/11/2012  . History of methicillin resistant staphylococcus aureus (MRSA) 07/04/2010  . DEGENERATIVE DISC DISEASE 07/04/2010  . History of total right knee replacement 07/04/2010  . Osteoarthritis 06/09/2007  . Personal history of colonic adenoma 06/01/2003   Past Medical History:  Diagnosis Date  . Acute kidney failure 08/2008   "cleared up"  no problems since  . Allergy   . Anxiety   . Arthritis   . Asthma    ?of this no inhaler  . Blood transfusion   . CHF (congestive heart failure) ([place])    ?of this, pt denies  . CLOSTRIDIUM DIFFICILE COLITIS 07/04/2010   Annotation: 12/09, 2/10 Qualifier: Diagnosis of  By: [name], [name]    . Depression   . Depression with anxiety   . Duodenitis determined by biopsy 02/2016   peptic likely due to aleve (erosive gastropathy with duodenal erosions)  . ECZEMA 07/04/2010   Qualifier: Diagnosis of  By: [name], [name]    . GERD (gastroesophageal reflux disease)   . Heart attack ([facility])    08/2008 (likely demand ischemia in the setting of MSSA sepsis/R TKA infection)  . History of hiatal hernia   . History of stomach ulcers   . Hypothyroidism   . Lower GI bleeding   . MALAR AND MAXILLARY BONES CLOSED FRACTURE 07/04/2010   Annotation: ORIF Qualifier: Diagnosis of  By: [name], [name]    . Migraine    "definitely"  . MRSA (methicillin resistant Staphylococcus aureus)    in leg, had to place steel rod in leg  .  Personal history of colonic adenoma 06/01/2003  . PFO (patent foramen ovale)    small PFO by 08/2008 TEE  . Pneumonia 08/2008   "while in ICU"  . Seizures ([place]) 2009   "long time ago"    . Stroke [facility]) 2010   unable to complete sentences at times    Family History  Problem Relation Age of Onset  . Coronary artery disease Father   . Heart disease Mother   . Breast cancer Sister   . Diabetes Brother   . Diabetes Sister   . Colon cancer Neg Hx   . Stomach cancer Neg Hx     Past Surgical History:  Procedure Laterality Date  . anterior  nerve transposition  07/2009   left ulnar nerve  . antibiotic spacer exchange  11/2008; 08/2006   right knee  . ARTHROTOMY  08/2008   right knee w/I&D  . CATARACT EXTRACTION    . COLONOSCOPY  11/2012   diverticulosis, rpt 10 yrs [name])  . ESOPHAGOGASTRODUODENOSCOPY  02/2016   erosive gastropathy with duodenal erosions [name])  . HARDWARE REMOVAL  04/2006   right knee w/antibiotic spacers placed  . INGUINAL HERNIA REPAIR  early 1990's   bilateral  . JOINT REPLACEMENT Right    rt knee  . KNEE ARTHROSCOPY  10/2001   right  . KNEE FUSION  03/2009   right knee removal; antibiotic spacers;   . REPLACEMENT TOTAL KNEE  08/2008; 09/2001   right  . rod placement Right   . SYNOVECTOMY  06/2005   debridement, liner exchange right knee  . TOE AMPUTATION Left 08/2008   great toe  . TOE AMPUTATION Left 04/2016   2nd toe [name]  . TONSILLECTOMY    . TOTAL KNEE ARTHROPLASTY Left 01/15/2017  . TOTAL KNEE ARTHROPLASTY Left 01/15/2017   Procedure: TOTAL KNEE ARTHROPLASTY;  Surgeon: Meredith Pel, MD;  Location: [facility];  Service: Orthopedics;  Laterality: Left;   Social History   Occupational History  . retired    Social History Main Topics  . Smoking status: Never Smoker  . Smokeless tobacco: Never Used  . Alcohol use No     Comment: "I abused alcohol; last drink  ~ 2005"  . Drug use: No  . Sexual activity: No

## 2017-06-12 ENCOUNTER — Ambulatory Visit (INDEPENDENT_AMBULATORY_CARE_PROVIDER_SITE_OTHER): Payer: PPO | Admitting: Orthopedic Surgery

## 2017-06-12 ENCOUNTER — Encounter (INDEPENDENT_AMBULATORY_CARE_PROVIDER_SITE_OTHER): Payer: Self-pay | Admitting: Orthopedic Surgery

## 2017-06-12 DIAGNOSIS — G8929 Other chronic pain: Secondary | ICD-10-CM

## 2017-06-12 DIAGNOSIS — M19019 Primary osteoarthritis, unspecified shoulder: Secondary | ICD-10-CM | POA: Diagnosis not present

## 2017-06-12 DIAGNOSIS — M545 Low back pain: Secondary | ICD-10-CM | POA: Diagnosis not present

## 2017-06-13 NOTE — Progress Notes (Signed)
Office Visit Note   Patient: Nicholas [name]           Date of Birth: April 24, 1951           MRN: [id number] Visit Date: 06/12/2017 Requested by: [name], MD [place], [place] [address] PCP: [name], MD  Subjective: Chief Complaint  Patient presents with  . Shoulder Pain    bilateral pain L>R    HPI: Nicholas [name] is a 66 year old patient with bilateral shoulder pain left worse than right.  He has known end-stage glenohumeral arthritis.  Reports constant pain.  He is right-hand-dominant.  He is doing well at his left total knee replacement.  The pain radiates down to his hand.  Patient also reports having low back pain.  He saw Dr. [name] 2 weeks ago.  No history of back surgery.  Radiographs fairly unremarkable by patient's report at that time.  He denies any neck pain.              ROS: All systems reviewed are negative as they relate to the chief complaint within the history of present illness.  Patient denies  fevers or chills.   Assessment & Plan: Visit Diagnoses:  1. Chronic low back pain, unspecified back pain laterality, with sciatica presence unspecified   2. Shoulder arthritis     Plan: Impression is endstage bilateral shoulder arthritis left worse than right.  Has about 10-15 of external rotation on the left-hand side.  Plan is anatomic shoulder replacement as the patient's rotator cuff appears to be intact.  Risks and benefits are discussed with [name] including not limited to infection or vessel damage incomplete pain relief as well as dislocation.  Patient understands risks and benefits and wished to proceed.  All questions answered.  In regards to his back pain I think he needs MRI of his lumbar spine with possible epidural or surgery injections to follow.  I'll call him with results of that lumbar spine MRI.  Follow-Up Instructions: No Follow-up on file.   Orders:  Orders Placed This Encounter  Procedures  . MR Lumbar Spine w/o contrast   . CT SHOULDER LEFT WO CONTRAST   No orders of the defined types were placed in this encounter.     Procedures: No procedures performed   Clinical Data: No additional findings.  Objective: Vital Signs: There were no vitals taken for this visit.  Physical Exam:   Constitutional: Patient appears well-developed HEENT:  Head: Normocephalic Eyes:EOM are normal Neck: Normal range of motion Cardiovascular: Normal rate Pulmonary/chest: Effort normal Neurologic: Patient is alert Skin: Skin is warm Psychiatric: Patient has normal mood and affect    Ortho Exam: Orthopedic exam demonstrates on the left-hand side good rotator cuff strength to isolated infraspinatus super space and subscap muscle testing.  He has 5 out of 5 grip EPL FPL interosseous wrist flexion-extension biceps triceps and deltoid strength.  Range of motion on the left shoulder is about 15 of external rotation about 75 of forward flexion and 70 of abduction.  Coarseness is present with passive range of motion of the left and right shoulder but the range of motion is a little better on the right-hand side  Patient does have nerve retention signs on the left compared to the right.  He has good ankle dorsiflexion plantar flexion strength.  Has a fused right knee.  Left knee bends to 110.  No paresthesias in either leg.  Specialty Comments:  No specialty comments available.  Imaging:  No results found.   PMFS History: Patient Active Problem List   Diagnosis Date Noted  . Bilateral shoulder pain 05/24/2017  . History of CVA in adulthood 03/08/2017  . S/P total knee arthroplasty, left 01/30/2017  . Arthritis of knee 01/15/2017  . Skin rash 12/10/2016  . Medicare annual wellness visit, initial 10/05/2016  . Advanced care planning/counseling discussion 10/05/2016  . Diarrhea 08/21/2016  . Obesity, Class II, BMI 35-39.9, with comorbidity 07/19/2016  . Hypothyroidism   . IDA (iron deficiency anemia) 04/23/2016  .  Chronic pain syndrome 04/23/2016  . Inflammatory arthritis 04/23/2016  . Chronic leg pain 04/23/2016  . Lesion of palate 04/23/2016  . Grieving 04/23/2016  . GERD (gastroesophageal reflux disease)   . Duodenitis determined by biopsy 02/13/2016  . Diverticulosis of colon without hemorrhage 01/31/2016  . Migraine 01/11/2012  . History of methicillin resistant staphylococcus aureus (MRSA) 07/04/2010  . DEGENERATIVE DISC DISEASE 07/04/2010  . History of total right knee replacement 07/04/2010  . Osteoarthritis 06/09/2007  . Personal history of colonic adenoma 06/01/2003   Past Medical History:  Diagnosis Date  . Acute kidney failure 08/2008   "cleared up"  no problems since  . Allergy   . Anxiety   . Arthritis   . Asthma    ?of this no inhaler  . Blood transfusion   . CHF (congestive heart failure) ([place])    ?of this, pt denies  . CLOSTRIDIUM DIFFICILE COLITIS 07/04/2010   Annotation: 12/09, 2/10 Qualifier: Diagnosis of  By: [name], [name]    . Depression   . Depression with anxiety   . Duodenitis determined by biopsy 02/2016   peptic likely due to aleve (erosive gastropathy with duodenal erosions)  . ECZEMA 07/04/2010   Qualifier: Diagnosis of  By: [name], [name]    . GERD (gastroesophageal reflux disease)   . Heart attack ([place])    08/2008 (likely demand ischemia in the setting of MSSA sepsis/R TKA infection)  . History of hiatal hernia   . History of stomach ulcers   . Hypothyroidism   . Lower GI bleeding   . MALAR AND MAXILLARY BONES CLOSED FRACTURE 07/04/2010   Annotation: ORIF Qualifier: Diagnosis of  By: [name], [name]    . Migraine    "definitely"  . MRSA (methicillin resistant Staphylococcus aureus)    in leg, had to place steel rod in leg  . Personal history of colonic adenoma 06/01/2003  . PFO (patent foramen ovale)    small PFO by 08/2008 TEE  . Pneumonia 08/2008   "while in ICU"  . Seizures ([place]) 2009   "long time ago"    . Stroke [facility]) 2010   unable  to complete sentences at times    Family History  Problem Relation Age of Onset  . Coronary artery disease Father   . Heart disease Mother   . Breast cancer Sister   . Diabetes Brother   . Diabetes Sister   . Colon cancer Neg Hx   . Stomach cancer Neg Hx     Past Surgical History:  Procedure Laterality Date  . anterior  nerve transposition  07/2009   left ulnar nerve  . antibiotic spacer exchange  11/2008; 08/2006   right knee  . ARTHROTOMY  08/2008   right knee w/I&D  . CATARACT EXTRACTION    . COLONOSCOPY  11/2012   diverticulosis, rpt 10 yrs [name])  . ESOPHAGOGASTRODUODENOSCOPY  02/2016   erosive gastropathy with duodenal erosions [name])  . HARDWARE REMOVAL  04/2006   right knee w/antibiotic spacers placed  . INGUINAL HERNIA REPAIR  early 1990's   bilateral  . JOINT REPLACEMENT Right    rt knee  . KNEE ARTHROSCOPY  10/2001   right  . KNEE FUSION  03/2009   right knee removal; antibiotic spacers;   . REPLACEMENT TOTAL KNEE  08/2008; 09/2001   right  . rod placement Right   . SYNOVECTOMY  06/2005   debridement, liner exchange right knee  . TOE AMPUTATION Left 08/2008   great toe  . TOE AMPUTATION Left 04/2016   2nd toe [name]  . TONSILLECTOMY    . TOTAL KNEE ARTHROPLASTY Left 01/15/2017  . TOTAL KNEE ARTHROPLASTY Left 01/15/2017   Procedure: TOTAL KNEE ARTHROPLASTY;  Surgeon: Meredith Pel, MD;  Location: [facility];  Service: Orthopedics;  Laterality: Left;   Social History   Occupational History  . retired    Social History Main Topics  . Smoking status: Never Smoker  . Smokeless tobacco: Never Used  . Alcohol use No     Comment: "I abused alcohol; last drink  ~ 2005"  . Drug use: No  . Sexual activity: No

## 2017-06-14 ENCOUNTER — Other Ambulatory Visit: Payer: Self-pay | Admitting: Family Medicine

## 2017-06-24 DIAGNOSIS — G894 Chronic pain syndrome: Secondary | ICD-10-CM | POA: Diagnosis not present

## 2017-06-24 DIAGNOSIS — G8929 Other chronic pain: Secondary | ICD-10-CM | POA: Diagnosis not present

## 2017-06-24 DIAGNOSIS — M25569 Pain in unspecified knee: Secondary | ICD-10-CM | POA: Diagnosis not present

## 2017-06-24 DIAGNOSIS — F1120 Opioid dependence, uncomplicated: Secondary | ICD-10-CM | POA: Diagnosis not present

## 2017-06-24 DIAGNOSIS — G8928 Other chronic postprocedural pain: Secondary | ICD-10-CM | POA: Diagnosis not present

## 2017-06-24 DIAGNOSIS — R51 Headache: Secondary | ICD-10-CM | POA: Diagnosis not present

## 2017-06-24 DIAGNOSIS — M25512 Pain in left shoulder: Secondary | ICD-10-CM | POA: Diagnosis not present

## 2017-06-24 DIAGNOSIS — Z79899 Other long term (current) drug therapy: Secondary | ICD-10-CM | POA: Diagnosis not present

## 2017-06-24 DIAGNOSIS — M25511 Pain in right shoulder: Secondary | ICD-10-CM | POA: Diagnosis not present

## 2017-06-25 DIAGNOSIS — M129 Arthropathy, unspecified: Secondary | ICD-10-CM | POA: Diagnosis not present

## 2017-06-25 DIAGNOSIS — Z0000 Encounter for general adult medical examination without abnormal findings: Secondary | ICD-10-CM | POA: Diagnosis not present

## 2017-06-25 DIAGNOSIS — K219 Gastro-esophageal reflux disease without esophagitis: Secondary | ICD-10-CM | POA: Diagnosis not present

## 2017-06-25 DIAGNOSIS — E781 Pure hyperglyceridemia: Secondary | ICD-10-CM | POA: Diagnosis not present

## 2017-06-25 DIAGNOSIS — G43909 Migraine, unspecified, not intractable, without status migrainosus: Secondary | ICD-10-CM | POA: Diagnosis not present

## 2017-06-25 DIAGNOSIS — E039 Hypothyroidism, unspecified: Secondary | ICD-10-CM | POA: Diagnosis not present

## 2017-06-25 DIAGNOSIS — E559 Vitamin D deficiency, unspecified: Secondary | ICD-10-CM | POA: Diagnosis not present

## 2017-06-27 ENCOUNTER — Ambulatory Visit
Admission: RE | Admit: 2017-06-27 | Discharge: 2017-06-27 | Disposition: A | Discharge status: Home / Self Care | Payer: PPO | Source: Ambulatory Visit | Attending: Orthopedic Surgery | Admitting: Orthopedic Surgery

## 2017-06-27 ENCOUNTER — Ambulatory Visit (INDEPENDENT_AMBULATORY_CARE_PROVIDER_SITE_OTHER): Payer: PPO | Admitting: Licensed Clinical Social Worker

## 2017-06-27 DIAGNOSIS — M545 Low back pain: Principal | ICD-10-CM

## 2017-06-27 DIAGNOSIS — M19019 Primary osteoarthritis, unspecified shoulder: Secondary | ICD-10-CM

## 2017-06-27 DIAGNOSIS — G8929 Other chronic pain: Secondary | ICD-10-CM

## 2017-06-27 DIAGNOSIS — F331 Major depressive disorder, recurrent, moderate: Secondary | ICD-10-CM

## 2017-06-27 DIAGNOSIS — M4805 Spinal stenosis, thoracolumbar region: Secondary | ICD-10-CM | POA: Diagnosis not present

## 2017-06-27 DIAGNOSIS — M19012 Primary osteoarthritis, left shoulder: Secondary | ICD-10-CM | POA: Diagnosis not present

## 2017-07-04 ENCOUNTER — Other Ambulatory Visit (INDEPENDENT_AMBULATORY_CARE_PROVIDER_SITE_OTHER): Payer: Self-pay | Admitting: Orthopedic Surgery

## 2017-07-04 ENCOUNTER — Encounter (INDEPENDENT_AMBULATORY_CARE_PROVIDER_SITE_OTHER): Payer: Self-pay | Admitting: Orthopedic Surgery

## 2017-07-04 DIAGNOSIS — M19012 Primary osteoarthritis, left shoulder: Secondary | ICD-10-CM

## 2017-07-11 ENCOUNTER — Other Ambulatory Visit (INDEPENDENT_AMBULATORY_CARE_PROVIDER_SITE_OTHER): Payer: Self-pay | Admitting: Orthopedic Surgery

## 2017-07-12 ENCOUNTER — Other Ambulatory Visit: Payer: Self-pay | Admitting: Internal Medicine

## 2017-07-12 DIAGNOSIS — K269 Duodenal ulcer, unspecified as acute or chronic, without hemorrhage or perforation: Secondary | ICD-10-CM

## 2017-07-12 DIAGNOSIS — K2960 Other gastritis without bleeding: Secondary | ICD-10-CM

## 2017-07-15 DIAGNOSIS — Z23 Encounter for immunization: Secondary | ICD-10-CM | POA: Diagnosis not present

## 2017-07-15 DIAGNOSIS — N289 Disorder of kidney and ureter, unspecified: Secondary | ICD-10-CM | POA: Diagnosis not present

## 2017-07-15 DIAGNOSIS — K760 Fatty (change of) liver, not elsewhere classified: Secondary | ICD-10-CM | POA: Diagnosis not present

## 2017-07-22 DIAGNOSIS — L218 Other seborrheic dermatitis: Secondary | ICD-10-CM | POA: Diagnosis not present

## 2017-07-23 ENCOUNTER — Ambulatory Visit (INDEPENDENT_AMBULATORY_CARE_PROVIDER_SITE_OTHER): Payer: PPO | Admitting: Licensed Clinical Social Worker

## 2017-07-23 ENCOUNTER — Ambulatory Visit: Payer: PPO | Admitting: Licensed Clinical Social Worker

## 2017-07-23 DIAGNOSIS — N289 Disorder of kidney and ureter, unspecified: Secondary | ICD-10-CM | POA: Diagnosis not present

## 2017-07-23 DIAGNOSIS — F3341 Major depressive disorder, recurrent, in partial remission: Secondary | ICD-10-CM | POA: Diagnosis not present

## 2017-07-23 DIAGNOSIS — K7689 Other specified diseases of liver: Secondary | ICD-10-CM | POA: Diagnosis not present

## 2017-07-29 ENCOUNTER — Telehealth (INDEPENDENT_AMBULATORY_CARE_PROVIDER_SITE_OTHER): Payer: Self-pay | Admitting: Orthopedic Surgery

## 2017-07-29 NOTE — Telephone Encounter (Signed)
Please advise. Shouldn't this come from patients PCP??

## 2017-07-29 NOTE — Telephone Encounter (Signed)
Patient called asking for a refill on Imitrex. CB # [phone]

## 2017-07-29 NOTE — Telephone Encounter (Signed)
Called and discussed with patient

## 2017-07-29 NOTE — Telephone Encounter (Signed)
Not my deal

## 2017-08-01 ENCOUNTER — Other Ambulatory Visit (INDEPENDENT_AMBULATORY_CARE_PROVIDER_SITE_OTHER): Payer: Self-pay | Admitting: Orthopedic Surgery

## 2017-08-01 ENCOUNTER — Encounter (INDEPENDENT_AMBULATORY_CARE_PROVIDER_SITE_OTHER): Payer: Self-pay | Admitting: Orthopedic Surgery

## 2017-08-01 ENCOUNTER — Ambulatory Visit (INDEPENDENT_AMBULATORY_CARE_PROVIDER_SITE_OTHER): Payer: PPO | Admitting: Orthopedic Surgery

## 2017-08-01 DIAGNOSIS — M19019 Primary osteoarthritis, unspecified shoulder: Secondary | ICD-10-CM

## 2017-08-01 DIAGNOSIS — G8929 Other chronic pain: Secondary | ICD-10-CM | POA: Diagnosis not present

## 2017-08-01 DIAGNOSIS — M5442 Lumbago with sciatica, left side: Secondary | ICD-10-CM

## 2017-08-03 NOTE — Progress Notes (Signed)
Office Visit Note   Patient: Nicholas [name]           Date of Birth: September 19, 1951           MRN: [id number] Visit Date: 08/01/2017 Requested by: [name], MD [place], [place] [address] PCP: [name], MD  Subjective: Chief Complaint  Patient presents with  . Lower Back - Follow-up  . Left Shoulder - Follow-up    EVO:[id number] is a patient here for review of lumbar spine MRI. MRI scan shows moderate spinal stenosis at T12-L1.moderate spinal stenosis at L4-5  Severe leftforaminal encroachment at L5S1.  Patient also has liver issues which will be resolved at the end of this month.  CT scan of left shoulder demonstrates B2 glenoid     ROS: All systems reviewed are negative as they relate to the chief complaint within the history of present illness.  Patient denies  fevers or chills.   Assessment & Plan: Visit Diagnoses: No diagnosis found.  Plan: impression is degenerative problem in lumbar spine.  Would do well with injections.  Refer to Dr. Ernestina Patches for these injections in January. He will call me before surgery on the shoulder with results of liver workup.  Follow-Up Instructions: No Follow-up on file.   Orders:  No orders of the defined types were placed in this encounter.  No orders of the defined types were placed in this encounter.     Procedures: No procedures performed   Clinical Data: No additional findings.  Objective: Vital Signs: There were no vitals taken for this visit.  Physical Exam:   Constitutional: Patient appears well-developed HEENT:  Head: Normocephalic Eyes:EOM are normal Neck: Normal range of motion Cardiovascular: Normal rate Pulmonary/chest: Effort normal Neurologic: Patient is alert Skin: Skin is warm Psychiatric: Patient has normal mood and affect    Ortho Exam: orthopedic exam demonstrates no nerve root tension signs on the left.  No groin pain with internal and external rotation on the left.  Good  ankle dorsiflexion and plantar Flexion strength is present on the left.  Shoulder examination unchanged  Specialty Comments:  No specialty comments available.  Imaging: No results found.   PMFS History: Patient Active Problem List   Diagnosis Date Noted  . Bilateral shoulder pain 05/24/2017  . History of CVA in adulthood 03/08/2017  . S/P total knee arthroplasty, left 01/30/2017  . Arthritis of knee 01/15/2017  . Skin rash 12/10/2016  . Medicare annual wellness visit, initial 10/05/2016  . Advanced care planning/counseling discussion 10/05/2016  . Diarrhea 08/21/2016  . Obesity, Class II, BMI 35-39.9, with comorbidity 07/19/2016  . Hypothyroidism   . IDA (iron deficiency anemia) 04/23/2016  . Chronic pain syndrome 04/23/2016  . Inflammatory arthritis 04/23/2016  . Chronic leg pain 04/23/2016  . Lesion of palate 04/23/2016  . Grieving 04/23/2016  . GERD (gastroesophageal reflux disease)   . Duodenitis determined by biopsy 02/13/2016  . Diverticulosis of colon without hemorrhage 01/31/2016  . Migraine 01/11/2012  . History of methicillin resistant staphylococcus aureus (MRSA) 07/04/2010  . DEGENERATIVE DISC DISEASE 07/04/2010  . History of total right knee replacement 07/04/2010  . Osteoarthritis 06/09/2007  . Personal history of colonic adenoma 06/01/2003   Past Medical History:  Diagnosis Date  . Acute kidney failure 08/2008   "cleared up"  no problems since  . Allergy   . Anxiety   . Arthritis   . Asthma    ?of this no inhaler  . Blood transfusion   .  CHF (congestive heart failure) ([place])    ?of this, pt denies  . CLOSTRIDIUM DIFFICILE COLITIS 07/04/2010   Annotation: 12/09, 2/10 Qualifier: Diagnosis of  By: [name], [name]    . Depression   . Depression with anxiety   . Duodenitis determined by biopsy 02/2016   peptic likely due to aleve (erosive gastropathy with duodenal erosions)  . ECZEMA 07/04/2010   Qualifier: Diagnosis of  By: [name], [name]    .  GERD (gastroesophageal reflux disease)   . Heart attack ([place])    08/2008 (likely demand ischemia in the setting of MSSA sepsis/R TKA infection)  . History of hiatal hernia   . History of stomach ulcers   . Hypothyroidism   . Lower GI bleeding   . MALAR AND MAXILLARY BONES CLOSED FRACTURE 07/04/2010   Annotation: ORIF Qualifier: Diagnosis of  By: [name], [name]    . Migraine    "definitely"  . MRSA (methicillin resistant Staphylococcus aureus)    in leg, had to place steel rod in leg  . Personal history of colonic adenoma 06/01/2003  . PFO (patent foramen ovale)    small PFO by 08/2008 TEE  . Pneumonia 08/2008   "while in ICU"  . Seizures ([place]) 2009   "long time ago"    . Stroke [facility]) 2010   unable to complete sentences at times    Family History  Problem Relation Age of Onset  . Coronary artery disease Father   . Heart disease Mother   . Breast cancer Sister   . Diabetes Brother   . Diabetes Sister   . Colon cancer Neg Hx   . Stomach cancer Neg Hx     Past Surgical History:  Procedure Laterality Date  . anterior  nerve transposition  07/2009   left ulnar nerve  . antibiotic spacer exchange  11/2008; 08/2006   right knee  . ARTHROTOMY  08/2008   right knee w/I&D  . CATARACT EXTRACTION    . COLONOSCOPY  11/2012   diverticulosis, rpt 10 yrs [name])  . ESOPHAGOGASTRODUODENOSCOPY  02/2016   erosive gastropathy with duodenal erosions [name])  . HARDWARE REMOVAL  04/2006   right knee w/antibiotic spacers placed  . INGUINAL HERNIA REPAIR  early 1990's   bilateral  . JOINT REPLACEMENT Right    rt knee  . KNEE ARTHROSCOPY  10/2001   right  . KNEE FUSION  03/2009   right knee removal; antibiotic spacers;   . REPLACEMENT TOTAL KNEE  08/2008; 09/2001   right  . rod placement Right   . SYNOVECTOMY  06/2005   debridement, liner exchange right knee  . TOE AMPUTATION Left 08/2008   great toe  . TOE AMPUTATION Left 04/2016   2nd toe [name]  . TONSILLECTOMY    . TOTAL  KNEE ARTHROPLASTY Left 01/15/2017  . TOTAL KNEE ARTHROPLASTY Left 01/15/2017   Procedure: TOTAL KNEE ARTHROPLASTY;  Surgeon: Meredith Pel, MD;  Location: [facility];  Service: Orthopedics;  Laterality: Left;   Social History   Occupational History  . retired    Social History Main Topics  . Smoking status: Never Smoker  . Smokeless tobacco: Never Used  . Alcohol use No     Comment: "I abused alcohol; last drink  ~ 2005"  . Drug use: No  . Sexual activity: No

## 2017-08-07 ENCOUNTER — Ambulatory Visit
Admission: RE | Admit: 2017-08-07 | Discharge: 2017-08-07 | Disposition: A | Discharge status: Home / Self Care | Payer: PPO | Source: Ambulatory Visit | Attending: Orthopedic Surgery | Admitting: Orthopedic Surgery

## 2017-08-07 DIAGNOSIS — M19019 Primary osteoarthritis, unspecified shoulder: Secondary | ICD-10-CM

## 2017-08-12 DIAGNOSIS — Q6102 Congenital multiple renal cysts: Secondary | ICD-10-CM | POA: Diagnosis not present

## 2017-08-12 DIAGNOSIS — R933 Abnormal findings on diagnostic imaging of other parts of digestive tract: Secondary | ICD-10-CM | POA: Diagnosis not present

## 2017-08-12 DIAGNOSIS — R932 Abnormal findings on diagnostic imaging of liver and biliary tract: Secondary | ICD-10-CM | POA: Diagnosis not present

## 2017-08-12 DIAGNOSIS — R93421 Abnormal radiologic findings on diagnostic imaging of right kidney: Secondary | ICD-10-CM | POA: Diagnosis not present

## 2017-08-12 DIAGNOSIS — R748 Abnormal levels of other serum enzymes: Secondary | ICD-10-CM | POA: Diagnosis not present

## 2017-08-12 DIAGNOSIS — R93422 Abnormal radiologic findings on diagnostic imaging of left kidney: Secondary | ICD-10-CM | POA: Diagnosis not present

## 2017-08-12 DIAGNOSIS — N281 Cyst of kidney, acquired: Secondary | ICD-10-CM | POA: Diagnosis not present

## 2017-08-19 DIAGNOSIS — G894 Chronic pain syndrome: Secondary | ICD-10-CM | POA: Diagnosis not present

## 2017-08-19 DIAGNOSIS — Z79899 Other long term (current) drug therapy: Secondary | ICD-10-CM | POA: Diagnosis not present

## 2017-08-19 DIAGNOSIS — F1120 Opioid dependence, uncomplicated: Secondary | ICD-10-CM | POA: Diagnosis not present

## 2017-08-19 DIAGNOSIS — M25569 Pain in unspecified knee: Secondary | ICD-10-CM | POA: Diagnosis not present

## 2017-08-19 DIAGNOSIS — R51 Headache: Secondary | ICD-10-CM | POA: Diagnosis not present

## 2017-08-19 DIAGNOSIS — M25512 Pain in left shoulder: Secondary | ICD-10-CM | POA: Diagnosis not present

## 2017-08-19 DIAGNOSIS — M25511 Pain in right shoulder: Secondary | ICD-10-CM | POA: Diagnosis not present

## 2017-08-20 NOTE — Pre-Procedure Instructions (Signed)
Nicholas [name]  08/20/2017      [id number] [address], [place] [address] [address] Phone: [phone] Fax: [phone]    Your procedure is scheduled on 08/27/2017.  Report to [facility] Admitting at 1:00 P.M.  Call this number if you have problems the morning of surgery:  [phone]   Remember:  Do not eat food or drink liquids after midnight.  Continue all other medications as directed by your physician except follow these medication instructions before surgery   Take these medicines the morning of surgery with A SIP OF WATER: Acetaminophen (Tylenol) - if needed Gabapentin (Neurontin) Hydrocodone- acetaminophen (Norco) Levothyroxine (Synthroid) Pantoprazole (Protonix) Sumatriptan (Imitrex) - if needed  7 days prior to surgery STOP taking any Aspirin (unless otherwise instructed by your surgeon), Aleve, Naproxen, Ibuprofen, Motrin, Advil, Goody's, BC's, all herbal medications, fish oil, and all vitamins    Do not wear jewelry  Do not wear lotions, powders, or colonges, or deodorant.  Men may shave face and neck.  Do not bring valuables to the hospital.  [facility] is not responsible for any belongings or valuables.  Contacts, eyeglasses, dentures or bridgework may not be worn into surgery.  Leave your suitcase in the car.  After surgery it may be brought to your room.  For patients admitted to the hospital, discharge time will be determined by your treatment team.  Patients discharged the day of surgery will not be allowed to drive home.   Name and phone number of your driver:    Special instructions:   [place]- Preparing For Surgery  Before surgery, you can play an important role. Because skin is not sterile, your skin needs to be as free of germs as possible. You can reduce the number of germs on your skin by washing with CHG (chlorahexidine gluconate) Soap before surgery.  CHG is an antiseptic  cleaner which kills germs and bonds with the skin to continue killing germs even after washing.  Please do not use if you have an allergy to CHG or antibacterial soaps. If your skin becomes reddened/irritated stop using the CHG.  Do not shave (including legs and underarms) for at least 48 hours prior to first CHG shower. It is OK to shave your face.  Please follow these instructions carefully.   1. Shower the NIGHT BEFORE SURGERY and the MORNING OF SURGERY with CHG.   2. If you chose to wash your hair, wash your hair first as usual with your normal shampoo.  3. After you shampoo, rinse your hair and body thoroughly to remove the shampoo.  4. Use CHG as you would any other liquid soap. You can apply CHG directly to the skin and wash gently with a scrungie or a clean washcloth.   5. Apply the CHG Soap to your body ONLY FROM THE NECK DOWN.  Do not use on open wounds or open sores. Avoid contact with your eyes, ears, mouth and genitals (private parts). Wash Face and genitals (private parts)  with your normal soap.  6. Wash thoroughly, paying special attention to the area where your surgery will be performed.  7. Thoroughly rinse your body with warm water from the neck down.  8. DO NOT shower/wash with your normal soap after using and rinsing off the CHG Soap.  9. Pat yourself dry with a CLEAN TOWEL.  10. Wear CLEAN PAJAMAS to bed the night before surgery, wear comfortable clothes the morning  of surgery  11. Place CLEAN SHEETS on your bed the night of your first shower and DO NOT SLEEP WITH PETS.    Day of Surgery: Shower as stated above. Do not apply any deodorants/lotions.  Please wear clean clothes to the hospital/surgery center.      Please read over the following fact sheets that you were given. Coughing and Deep Breathing, Total Joint Packet, MRSA Information and Surgical Site Infection Prevention

## 2017-08-21 ENCOUNTER — Encounter (HOSPITAL_COMMUNITY): Payer: Self-pay

## 2017-08-21 ENCOUNTER — Other Ambulatory Visit: Payer: Self-pay

## 2017-08-21 ENCOUNTER — Encounter (HOSPITAL_COMMUNITY)
Admission: RE | Admit: 2017-08-21 | Discharge: 2017-08-21 | Disposition: A | Discharge status: Home / Self Care | Payer: PPO | Source: Ambulatory Visit | Attending: Orthopedic Surgery | Admitting: Orthopedic Surgery

## 2017-08-21 DIAGNOSIS — Z01812 Encounter for preprocedural laboratory examination: Secondary | ICD-10-CM | POA: Insufficient documentation

## 2017-08-21 DIAGNOSIS — K219 Gastro-esophageal reflux disease without esophagitis: Secondary | ICD-10-CM | POA: Insufficient documentation

## 2017-08-21 DIAGNOSIS — G43909 Migraine, unspecified, not intractable, without status migrainosus: Secondary | ICD-10-CM | POA: Diagnosis not present

## 2017-08-21 DIAGNOSIS — K449 Diaphragmatic hernia without obstruction or gangrene: Secondary | ICD-10-CM | POA: Insufficient documentation

## 2017-08-21 DIAGNOSIS — M1711 Unilateral primary osteoarthritis, right knee: Secondary | ICD-10-CM | POA: Diagnosis not present

## 2017-08-21 DIAGNOSIS — F418 Other specified anxiety disorders: Secondary | ICD-10-CM | POA: Diagnosis not present

## 2017-08-21 DIAGNOSIS — Q211 Atrial septal defect: Secondary | ICD-10-CM | POA: Insufficient documentation

## 2017-08-21 DIAGNOSIS — E039 Hypothyroidism, unspecified: Secondary | ICD-10-CM | POA: Diagnosis not present

## 2017-08-21 DIAGNOSIS — Z8673 Personal history of transient ischemic attack (TIA), and cerebral infarction without residual deficits: Secondary | ICD-10-CM | POA: Insufficient documentation

## 2017-08-21 DIAGNOSIS — Z79899 Other long term (current) drug therapy: Secondary | ICD-10-CM | POA: Insufficient documentation

## 2017-08-21 DIAGNOSIS — I252 Old myocardial infarction: Secondary | ICD-10-CM | POA: Insufficient documentation

## 2017-08-21 DIAGNOSIS — J45909 Unspecified asthma, uncomplicated: Secondary | ICD-10-CM | POA: Insufficient documentation

## 2017-08-21 HISTORY — DX: Abnormal levels of other serum enzymes: R74.8

## 2017-08-21 LAB — CBC
HCT: 46 % (ref 39.0–52.0)
Hemoglobin: 15.5 g/dL (ref 13.0–17.0)
MCH: 30.4 pg (ref 26.0–34.0)
MCHC: 33.7 g/dL (ref 30.0–36.0)
MCV: 90.2 fL (ref 78.0–100.0)
PLATELETS: 220 10*3/uL (ref 150–400)
RBC: 5.1 MIL/uL (ref 4.22–5.81)
RDW: 14.8 % (ref 11.5–15.5)
WBC: 11.7 10*3/uL — AB (ref 4.0–10.5)

## 2017-08-21 LAB — COMPREHENSIVE METABOLIC PANEL
ALBUMIN: 4.1 g/dL (ref 3.5–5.0)
ALT: 31 U/L (ref 17–63)
AST: 35 U/L (ref 15–41)
Alkaline Phosphatase: 86 U/L (ref 38–126)
Anion gap: 7 (ref 5–15)
BUN: 12 mg/dL (ref 6–20)
CHLORIDE: 105 mmol/L (ref 101–111)
CO2: 26 mmol/L (ref 22–32)
CREATININE: 1.47 mg/dL — AB (ref 0.61–1.24)
Calcium: 9.2 mg/dL (ref 8.9–10.3)
GFR calc non Af Amer: 48 mL/min — ABNORMAL LOW (ref >60)
GFR, EST AFRICAN AMERICAN: 56 mL/min — AB (ref >60)
GLUCOSE: 121 mg/dL — AB (ref 65–99)
Potassium: 4.2 mmol/L (ref 3.5–5.1)
SODIUM: 138 mmol/L (ref 135–145)
Total Bilirubin: 1.4 mg/dL — ABNORMAL HIGH (ref 0.3–1.2)
Total Protein: 7.9 g/dL (ref 6.5–8.1)

## 2017-08-21 LAB — SURGICAL PCR SCREEN
MRSA, PCR: NEGATIVE
STAPHYLOCOCCUS AUREUS: NEGATIVE

## 2017-08-21 NOTE — Progress Notes (Signed)
   08/21/17 1133  OBSTRUCTIVE SLEEP APNEA  Score 5 or greater  Results sent to PCP

## 2017-08-21 NOTE — Progress Notes (Signed)
Unable to obtain UA today was instructed by lab to bring in speci  Before surgery. Voices understanding.

## 2017-08-21 NOTE — Progress Notes (Addendum)
PCP is Dr. [name] Cardiologist is Dr Einar Gip Denies chest pain, fever, but does report a productive cough at times.  Echo noted 2009 States he had a stress test about 2 years ago? Dr Rosario Jacks in [place]. -request sent for results Denies ever having a card cath.  States that his liver enzymes have been elevated in the past, ordered changed to a CMET.

## 2017-08-21 NOTE — Progress Notes (Signed)
   08/21/17 1132  OBSTRUCTIVE SLEEP APNEA  Have you ever been diagnosed with sleep apnea through a sleep study? No  Do you snore loudly (loud enough to be heard through closed doors)?  0  Do you often feel tired, fatigued, or sleepy during the daytime (such as falling asleep during driving or talking to someone)? 1  Has anyone observed you stop breathing during your sleep? 0  Do you have, or are you being treated for high blood pressure? 1  BMI more than 35 kg/m2? 1  Age > 50 (1-yes) 1  Neck circumference greater than:Male 16 inches or larger, Male 17inches or larger? 1  Male Gender (Yes=1) 1  Obstructive Sleep Apnea Score 6  Score 5 or greater  Results sent to PCP

## 2017-08-22 DIAGNOSIS — N289 Disorder of kidney and ureter, unspecified: Secondary | ICD-10-CM | POA: Diagnosis not present

## 2017-08-22 DIAGNOSIS — K7689 Other specified diseases of liver: Secondary | ICD-10-CM | POA: Diagnosis not present

## 2017-08-22 LAB — URINALYSIS, ROUTINE W REFLEX MICROSCOPIC
BILIRUBIN URINE: NEGATIVE
Bacteria, UA: NONE SEEN
Glucose, UA: NEGATIVE mg/dL
Hgb urine dipstick: NEGATIVE
Ketones, ur: NEGATIVE mg/dL
Leukocytes, UA: NEGATIVE
NITRITE: NEGATIVE
PH: 5 (ref 5.0–8.0)
Protein, ur: 30 mg/dL — AB
SPECIFIC GRAVITY, URINE: 1.027 (ref 1.005–1.030)

## 2017-08-22 NOTE — Progress Notes (Signed)
Anesthesia chart review: Patient is a 66 year old male scheduled for left total shoulder arthroplasty on 08/27/17 by Dr. Meredith Pel.  History includes nonsmoker, MI 08/2008 (possible demand ischemia in the setting of MSSA sepsis), CHF (patient denied; I couldn't find definitive evidence in Epic to support this either and also not listed in Dr. Irven Shelling records), small PFO (by 08/2008 TEE), GERD, hiatal hernia, depression, anxiety, migraines, asthma, hypothyroidism, elevated LFTs, C. difficile colitis '09 and '10, acute kidney failure (in the setting of right knee infection/septic syndrome) 08/2008, GI bleed/peptic ulcer, CVA (anterior right periopercular region) 08/2008, seizure (remote), right TKA 09/18/01 s/p removal 04/11/06 (MSSA infection) with revision 06/25/06 s/p removal and left great toe amputation 09/07/08 (in the setting of MSSA sepsis with right knee septic arthritis and acute pyelonephritis with septic emboli), s/p right knee removal and knee fusion 03/29/09, left TKA 01/15/17. OSA screening score of 6.   PCP is Dr. [name], last visit 05/24/17. He also sees Dr. Casilda Carls with [facility], last visit 3/27/128 for follow-up hypothyroidism.   Cardiologist is Dr. Adrian Prows with [facility] (records scanned under Media tab, Correspondence 01/15/17). First seen 01/10/12 for inpatient evaluation for chest pain that was felt likely musculoskeletal. Out-patient follow-up planned. Last visit 06/01/15 with Neldon Labella, AGNP-C for evaluation of chest pain felt "clearly suggestive of musculoskeletal etiology". PRN cardiology follow-up recommended. He also saw Dr. Loralie Champagne on 09/01/08 as inpatient consult for elevated troponin (in the setting of MSSA sepsis), likely demand ischemia and for TEE to evaluate for endocarditis (no vegetations, small PFO).   Meds include Elavil, gabapentin, Norco, levothyroxine, Protonix, probiotic, Imitrex.  BP  134/86   Temp 36.6 C   Resp 20   Ht 5' 9.25" (1.759 m)   Wt 263 lb 8 oz (119.5 kg)   SpO2 95%   BMI 38.63 kg/m   EKG 11/01/16: NSR.   Nuclear stress test 05/10/16 [facility], Dr. Rosario Jacks): Impression: Negative study. Resting LVEF 69%. No definite evidence of ischemia.   Echo 03/05/12 (according to [facility] records): Outside echo showed "normal LVEF. Grade 1 diastolic dysfunction. Moderate LVH."   CXR 01/04/17: IMPRESSION: No active cardiopulmonary disease. Mild degenerative changes mid thoracic spine.  Preoperative labs noted. BUN 12, Cr 1.47 (up from 1.32 on 07/23/17 at [facility]). Glucose 121. Total bilirubin 1.4, stable when compared to labs since 08/21/16. AST/ALT WNL. UA was negative for leukocytes and nitrites. Urine culture is in process.   If no acute changes then I would anticipate that he can proceed as planned. Of note, he had a small PFO by TEE in 2009.   George Hugh [facility] Stay Center/Anesthesiology Phone [phone] 08/22/2017 1:11 PM

## 2017-08-23 ENCOUNTER — Telehealth (INDEPENDENT_AMBULATORY_CARE_PROVIDER_SITE_OTHER): Payer: Self-pay

## 2017-08-23 DIAGNOSIS — N289 Disorder of kidney and ureter, unspecified: Secondary | ICD-10-CM | POA: Diagnosis not present

## 2017-08-23 DIAGNOSIS — E039 Hypothyroidism, unspecified: Secondary | ICD-10-CM | POA: Diagnosis not present

## 2017-08-23 DIAGNOSIS — K7689 Other specified diseases of liver: Secondary | ICD-10-CM | POA: Diagnosis not present

## 2017-08-23 LAB — URINE CULTURE
Culture: NO GROWTH
SPECIAL REQUESTS: NORMAL

## 2017-08-23 NOTE — Telephone Encounter (Signed)
Pls call him we need the ordering physicians clearance on this regarding his liver - he will also need pre op pt and ptt before surgery thx

## 2017-08-23 NOTE — Telephone Encounter (Signed)
Rescheduled patient to 09-03-17.  He asked if you received results from liver U/S.  Other physician was supposed to send them over as FYI.

## 2017-08-23 NOTE — Telephone Encounter (Signed)
See note from Dr [name] Sa. I have called patient and made him aware that this clearance will be needed. He stated he saw them yesterday and the doctor advised him that his liver should not prevent him from having surgery. I asked him to call that office and have them to fax a letter stating patient ok to proceed with surgery. The patient also had questions about arrival time for surgyer.

## 2017-08-23 NOTE — Telephone Encounter (Signed)
Laid results on your computer. Please advise. Thanks.

## 2017-08-26 ENCOUNTER — Ambulatory Visit: Payer: PPO | Admitting: Licensed Clinical Social Worker

## 2017-09-02 MED ORDER — DEXTROSE 5 % IV SOLN
3.0000 g | INTRAVENOUS | Status: AC
  Administered 2017-09-03 (×2): 3 g via INTRAVENOUS
  Filled 2017-09-02: qty 3000

## 2017-09-02 NOTE — Anesthesia Preprocedure Evaluation (Addendum)
Anesthesia Evaluation  Patient identified by MRN, date of birth, ID band Patient awake    Reviewed: Allergy & Precautions, NPO status , Patient's Chart, lab work & pertinent test results  Airway Mallampati: I  TM Distance: >3 FB Neck ROM: Full   Comment: Full beard Dental  (+) Lower Dentures, Upper Dentures   Pulmonary    Pulmonary exam normal breath sounds clear to auscultation       Cardiovascular + CAD, + Past MI and +CHF  Normal cardiovascular exam Rhythm:Regular Rate:Normal  PFO   Neuro/Psych  Headaches, Seizures -, Well Controlled,  PSYCHIATRIC DISORDERS Anxiety Depression CVA, No Residual Symptoms    GI/Hepatic Neg liver ROS, hiatal hernia, PUD, GERD  Medicated and Controlled,  Endo/Other  Hypothyroidism Obesity   Renal/GU negative Renal ROS     Musculoskeletal  (+) Arthritis , Osteoarthritis,    Abdominal   Peds  Hematology  (+) Blood dyscrasia, anemia , Plt 178k   Anesthesia Other Findings   Reproductive/Obstetrics                            Anesthesia Physical  Anesthesia Plan  ASA: III  Anesthesia Plan: General   Post-op Pain Management:  Regional for Post-op pain   Induction: Intravenous  PONV Risk Score and Plan: 3 and Midazolam, Dexamethasone, Ondansetron and Treatment may vary due to age or medical condition  Airway Management Planned: Oral ETT  Additional Equipment: None  Intra-op Plan:   Post-operative Plan: Extubation in OR  Informed Consent: I have reviewed the patients History and Physical, chart, labs and discussed the procedure including the risks, benefits and alternatives for the proposed anesthesia with the patient or authorized representative who has indicated his/her understanding and acceptance.   Dental advisory given  Plan Discussed with: CRNA  Anesthesia Plan Comments:         Anesthesia Quick Evaluation

## 2017-09-02 NOTE — H&P (Signed)
Nicholas [name] is an 66 y.o. male.   Chief Complaint: Left shoulder pain HPI: Nicholas [name] is a 66 year old patient with left shoulder pain.  He has a long history of left shoulder pain without discrete injury.  He has failed nonoperative management including injections and activity modification.  He presents now for shoulder replacement after explanation of risks and benefits.  Preoperative CT scanning demonstrates significant posterior glenoid wear.  Rotator cuff is intact.  Operative plan is patient's specific instrumentation and augmented baseplate with reverse shoulder replacement.  We should be able to accommodate some of this posterior deformity with an augmented baseplate and still achieve good fixation.  Past Medical History:  Diagnosis Date  . Acute kidney failure 08/2008   "cleared up"  no problems since  . Allergy   . Anxiety   . Arthritis   . Asthma    ?of this no inhaler  . Blood transfusion   . CHF (congestive heart failure) ([place])    ?of this, pt denies  . CLOSTRIDIUM DIFFICILE COLITIS 07/04/2010   Annotation: 12/09, 2/10 Qualifier: Diagnosis of  By: [name], [name]    . Depression   . Depression with anxiety   . Duodenitis determined by biopsy 02/2016   peptic likely due to aleve (erosive gastropathy with duodenal erosions)  . ECZEMA 07/04/2010   Qualifier: Diagnosis of  By: [name], [name]    . Elevated liver enzymes   . GERD (gastroesophageal reflux disease)   . Heart attack ([place])    08/2008 (likely demand ischemia in the setting of MSSA sepsis/R TKA  infection)10-2008  . History of hiatal hernia   . History of stomach ulcers   . Hypothyroidism   . Lower GI bleeding   . MALAR AND MAXILLARY BONES CLOSED FRACTURE 07/04/2010   Annotation: ORIF Qualifier: Diagnosis of  By: [name], [name]    . Migraine    "definitely"  . MRSA (methicillin resistant Staphylococcus aureus)    in leg, had to place steel rod in leg  . Personal history of colonic adenoma 06/01/2003  . PFO  (patent foramen ovale)    small PFO by 08/2008 TEE  . Pneumonia 08/2008   "while in ICU"  . Seizures ([place]) 2009   "long time ago"    . Stroke [facility]) 2010   unable to complete sentences at times    Past Surgical History:  Procedure Laterality Date  . anterior  nerve transposition  07/2009   left ulnar nerve  . antibiotic spacer exchange  11/2008; 08/2006   right knee  . ARTHROTOMY  08/2008   right knee w/I&D  . CATARACT EXTRACTION    . COLONOSCOPY  11/2012   diverticulosis, rpt 10 yrs [name])  . ESOPHAGOGASTRODUODENOSCOPY  02/2016   erosive gastropathy with duodenal erosions [name])  . HARDWARE REMOVAL  04/2006   right knee w/antibiotic spacers placed  . INGUINAL HERNIA REPAIR  early 1990's   bilateral  . JOINT REPLACEMENT Right    rt knee  . KNEE ARTHROSCOPY  10/2001   right  . KNEE FUSION  03/2009   right knee removal; antibiotic spacers;   . REPLACEMENT TOTAL KNEE  08/2008; 09/2001   right  . rod placement Right   . SYNOVECTOMY  06/2005   debridement, liner exchange right knee  . TOE AMPUTATION Left 08/2008   great toe  . TOE AMPUTATION Left 04/2016   2nd toe [name]  . TONSILLECTOMY    . TOTAL KNEE ARTHROPLASTY Left 01/15/2017  .  TOTAL KNEE ARTHROPLASTY Left 01/15/2017   Performed by Meredith Pel, MD at [facility] OR    Family History  Problem Relation Age of Onset  . Coronary artery disease Father   . Heart disease Mother   . Breast cancer Sister   . Diabetes Brother   . Diabetes Sister   . Colon cancer Neg Hx   . Stomach cancer Neg Hx    Social History:  reports that  has never smoked. he has never used smokeless tobacco. He reports that he does not drink alcohol or use drugs.  Allergies:  Allergies  Allergen Reactions  . Bee Venom Anaphylaxis and Other (See Comments)    Respiratory Distress  . Peanut-Containing Drug Products Anaphylaxis and Dermatitis  . Celebrex [Celecoxib] Other (See Comments)    BLACK STOOL?MELENA?  [name] Percocet  [Oxycodone-Acetaminophen] Hives and Swelling    SWELLING REACTION UNSPECIFIED   . Latex Rash  . Nsaids Other (See Comments)    Disagrees with system    No medications prior to admission.    No results found for this or any previous visit (from the past 48 hour(s)). No results found.  Review of Systems  Musculoskeletal: Positive for joint pain.  All other systems reviewed and are negative.   There were no vitals taken for this visit. Physical Exam  Constitutional: He appears well-developed.  HENT:  Head: Normocephalic.  Eyes: Pupils are equal, round, and reactive to light.  Neck: Normal range of motion.  Cardiovascular: Normal rate.  Respiratory: Effort normal.  Neurological: He is alert.  Skin: Skin is warm.  Psychiatric: He has a normal mood and affect.   Examination of the left shoulder demonstrates external rotation to 10 maximum.  Rotator cuff strength is intact to infraspinatus supraspinatus and subscapularis muscle testing.  No acromioclavicular joint tenderness.  He has less than 90 of forward flexion and abduction due to shoulder stiffness and capsular contracture  Assessment/Plan Impression is end stage left shoulder glenohumeral arthritis.  Plan is for reverse shoulder replacement.  Although the patient's rotator cuff is intact he has severe posterior glenoid wear.  Best option for concerning bone is augmented baseplate and reverse shoulder replacement.  Native glenoid placement in appropriate version would be likely unstable and have short expand due to the excessive posterior wear.  I think the augmented baseplate for the reverse shoulder replacement offers a more stable and reliable implant than anatomic shoulder replacement with an graft.  Patient understands the risks and benefits including but not limited to infection nerve and vessel damage shoulder stiffness as well as incomplete pain relief.  All questions answered.  Nicholas Malta, MD 09/02/2017, 11:24 PM

## 2017-09-03 ENCOUNTER — Inpatient Hospital Stay (HOSPITAL_COMMUNITY): Payer: PPO | Admitting: Anesthesiology

## 2017-09-03 ENCOUNTER — Encounter (HOSPITAL_COMMUNITY)
Admission: RE | Disposition: A | Discharge status: Skilled Nursing Facility | Payer: Self-pay | Source: Ambulatory Visit | Attending: Orthopedic Surgery

## 2017-09-03 ENCOUNTER — Inpatient Hospital Stay (HOSPITAL_COMMUNITY): Payer: PPO

## 2017-09-03 ENCOUNTER — Inpatient Hospital Stay (HOSPITAL_COMMUNITY): Payer: PPO | Admitting: Vascular Surgery

## 2017-09-03 ENCOUNTER — Encounter (HOSPITAL_COMMUNITY): Payer: Self-pay

## 2017-09-03 ENCOUNTER — Inpatient Hospital Stay (HOSPITAL_COMMUNITY)
Admission: RE | Admit: 2017-09-03 | Discharge: 2017-09-06 | DRG: 483 | Disposition: A | Discharge status: Skilled Nursing Facility | Payer: PPO | Source: Ambulatory Visit | Attending: Orthopedic Surgery | Admitting: Orthopedic Surgery

## 2017-09-03 DIAGNOSIS — Z89422 Acquired absence of other left toe(s): Secondary | ICD-10-CM

## 2017-09-03 DIAGNOSIS — F418 Other specified anxiety disorders: Secondary | ICD-10-CM | POA: Diagnosis present

## 2017-09-03 DIAGNOSIS — M19012 Primary osteoarthritis, left shoulder: Principal | ICD-10-CM | POA: Diagnosis present

## 2017-09-03 DIAGNOSIS — Z8711 Personal history of peptic ulcer disease: Secondary | ICD-10-CM

## 2017-09-03 DIAGNOSIS — Z86010 Personal history of colonic polyps: Secondary | ICD-10-CM | POA: Diagnosis not present

## 2017-09-03 DIAGNOSIS — Z885 Allergy status to narcotic agent status: Secondary | ICD-10-CM

## 2017-09-03 DIAGNOSIS — Z91010 Allergy to peanuts: Secondary | ICD-10-CM

## 2017-09-03 DIAGNOSIS — Z471 Aftercare following joint replacement surgery: Secondary | ICD-10-CM | POA: Diagnosis not present

## 2017-09-03 DIAGNOSIS — Z833 Family history of diabetes mellitus: Secondary | ICD-10-CM | POA: Diagnosis not present

## 2017-09-03 DIAGNOSIS — G894 Chronic pain syndrome: Secondary | ICD-10-CM | POA: Diagnosis not present

## 2017-09-03 DIAGNOSIS — I252 Old myocardial infarction: Secondary | ICD-10-CM | POA: Diagnosis not present

## 2017-09-03 DIAGNOSIS — Z91030 Bee allergy status: Secondary | ICD-10-CM

## 2017-09-03 DIAGNOSIS — Z803 Family history of malignant neoplasm of breast: Secondary | ICD-10-CM | POA: Diagnosis not present

## 2017-09-03 DIAGNOSIS — Z8673 Personal history of transient ischemic attack (TIA), and cerebral infarction without residual deficits: Secondary | ICD-10-CM | POA: Diagnosis not present

## 2017-09-03 DIAGNOSIS — Z8614 Personal history of Methicillin resistant Staphylococcus aureus infection: Secondary | ICD-10-CM

## 2017-09-03 DIAGNOSIS — Z89412 Acquired absence of left great toe: Secondary | ICD-10-CM | POA: Diagnosis not present

## 2017-09-03 DIAGNOSIS — Z886 Allergy status to analgesic agent status: Secondary | ICD-10-CM | POA: Diagnosis not present

## 2017-09-03 DIAGNOSIS — E039 Hypothyroidism, unspecified: Secondary | ICD-10-CM | POA: Diagnosis not present

## 2017-09-03 DIAGNOSIS — G43909 Migraine, unspecified, not intractable, without status migrainosus: Secondary | ICD-10-CM | POA: Diagnosis not present

## 2017-09-03 DIAGNOSIS — M6281 Muscle weakness (generalized): Secondary | ICD-10-CM | POA: Diagnosis not present

## 2017-09-03 DIAGNOSIS — M170 Bilateral primary osteoarthritis of knee: Secondary | ICD-10-CM | POA: Diagnosis not present

## 2017-09-03 DIAGNOSIS — M19019 Primary osteoarthritis, unspecified shoulder: Secondary | ICD-10-CM

## 2017-09-03 DIAGNOSIS — R2681 Unsteadiness on feet: Secondary | ICD-10-CM | POA: Diagnosis not present

## 2017-09-03 DIAGNOSIS — K219 Gastro-esophageal reflux disease without esophagitis: Secondary | ICD-10-CM | POA: Diagnosis present

## 2017-09-03 DIAGNOSIS — Z91040 Latex allergy status: Secondary | ICD-10-CM

## 2017-09-03 DIAGNOSIS — Z8249 Family history of ischemic heart disease and other diseases of the circulatory system: Secondary | ICD-10-CM | POA: Diagnosis not present

## 2017-09-03 DIAGNOSIS — F339 Major depressive disorder, recurrent, unspecified: Secondary | ICD-10-CM | POA: Diagnosis not present

## 2017-09-03 DIAGNOSIS — E669 Obesity, unspecified: Secondary | ICD-10-CM | POA: Diagnosis not present

## 2017-09-03 DIAGNOSIS — G8918 Other acute postprocedural pain: Secondary | ICD-10-CM | POA: Diagnosis not present

## 2017-09-03 DIAGNOSIS — Z96653 Presence of artificial knee joint, bilateral: Secondary | ICD-10-CM | POA: Diagnosis present

## 2017-09-03 DIAGNOSIS — M25511 Pain in right shoulder: Secondary | ICD-10-CM | POA: Diagnosis not present

## 2017-09-03 DIAGNOSIS — M25512 Pain in left shoulder: Secondary | ICD-10-CM | POA: Diagnosis not present

## 2017-09-03 DIAGNOSIS — Z888 Allergy status to other drugs, medicaments and biological substances status: Secondary | ICD-10-CM | POA: Diagnosis not present

## 2017-09-03 DIAGNOSIS — Z96612 Presence of left artificial shoulder joint: Secondary | ICD-10-CM | POA: Diagnosis not present

## 2017-09-03 DIAGNOSIS — F419 Anxiety disorder, unspecified: Secondary | ICD-10-CM | POA: Diagnosis not present

## 2017-09-03 DIAGNOSIS — Z96652 Presence of left artificial knee joint: Secondary | ICD-10-CM | POA: Diagnosis not present

## 2017-09-03 DIAGNOSIS — Z6839 Body mass index (BMI) 39.0-39.9, adult: Secondary | ICD-10-CM | POA: Diagnosis not present

## 2017-09-03 DIAGNOSIS — D509 Iron deficiency anemia, unspecified: Secondary | ICD-10-CM | POA: Diagnosis not present

## 2017-09-03 DIAGNOSIS — Z96651 Presence of right artificial knee joint: Secondary | ICD-10-CM | POA: Diagnosis not present

## 2017-09-03 HISTORY — PX: REVERSE SHOULDER ARTHROPLASTY: SHX5054

## 2017-09-03 SURGERY — ARTHROPLASTY, SHOULDER, TOTAL, REVERSE
Anesthesia: General | Site: Shoulder | Laterality: Left

## 2017-09-03 MED ORDER — ONDANSETRON HCL 4 MG/2ML IJ SOLN
INTRAMUSCULAR | Status: AC
  Filled 2017-09-03: qty 2

## 2017-09-03 MED ORDER — PHENYLEPHRINE 40 MCG/ML (10ML) SYRINGE FOR IV PUSH (FOR BLOOD PRESSURE SUPPORT)
PREFILLED_SYRINGE | INTRAVENOUS | Status: AC
  Filled 2017-09-03: qty 10

## 2017-09-03 MED ORDER — LEVOTHYROXINE SODIUM 25 MCG PO TABS
25.0000 ug | ORAL_TABLET | Freq: Once | ORAL | Status: AC
  Administered 2017-09-03: 25 ug via ORAL
  Filled 2017-09-03: qty 1

## 2017-09-03 MED ORDER — PANTOPRAZOLE SODIUM 40 MG PO TBEC
40.0000 mg | DELAYED_RELEASE_TABLET | Freq: Every day | ORAL | Status: DC
  Administered 2017-09-04 – 2017-09-06 (×3): 40 mg via ORAL
  Filled 2017-09-03 (×3): qty 1

## 2017-09-03 MED ORDER — BUPIVACAINE-EPINEPHRINE (PF) 0.5% -1:200000 IJ SOLN
INTRAMUSCULAR | Status: DC | PRN
  Administered 2017-09-03: 30 mL via PERINEURAL

## 2017-09-03 MED ORDER — FENTANYL CITRATE (PF) 100 MCG/2ML IJ SOLN
INTRAMUSCULAR | Status: DC | PRN
  Administered 2017-09-03 (×3): 50 ug via INTRAVENOUS

## 2017-09-03 MED ORDER — HYDROMORPHONE HCL 1 MG/ML IJ SOLN
0.5000 mg | INTRAMUSCULAR | Status: DC | PRN
  Administered 2017-09-05 – 2017-09-06 (×3): 0.5 mg via INTRAVENOUS
  Filled 2017-09-03 (×4): qty 1

## 2017-09-03 MED ORDER — LIDOCAINE 2% (20 MG/ML) 5 ML SYRINGE
INTRAMUSCULAR | Status: AC
  Filled 2017-09-03: qty 5

## 2017-09-03 MED ORDER — 0.9 % SODIUM CHLORIDE (POUR BTL) OPTIME
TOPICAL | Status: DC | PRN
  Administered 2017-09-03: 3000 mL
  Administered 2017-09-03: 1000 mL

## 2017-09-03 MED ORDER — GABAPENTIN 300 MG PO CAPS
300.0000 mg | ORAL_CAPSULE | Freq: Four times a day (QID) | ORAL | Status: DC
  Administered 2017-09-03 – 2017-09-06 (×12): 300 mg via ORAL
  Filled 2017-09-03 (×14): qty 1

## 2017-09-03 MED ORDER — GLYCOPYRROLATE 0.2 MG/ML IJ SOLN
INTRAMUSCULAR | Status: DC | PRN
  Administered 2017-09-03: 0.2 mg via INTRAVENOUS

## 2017-09-03 MED ORDER — ASPIRIN EC 325 MG PO TBEC
325.0000 mg | DELAYED_RELEASE_TABLET | Freq: Every day | ORAL | Status: DC
  Administered 2017-09-04 – 2017-09-06 (×3): 325 mg via ORAL
  Filled 2017-09-03 (×3): qty 1

## 2017-09-03 MED ORDER — MIDAZOLAM HCL 5 MG/5ML IJ SOLN
INTRAMUSCULAR | Status: DC | PRN
  Administered 2017-09-03 (×2): 1 mg via INTRAVENOUS

## 2017-09-03 MED ORDER — FENTANYL CITRATE (PF) 100 MCG/2ML IJ SOLN
25.0000 ug | INTRAMUSCULAR | Status: DC | PRN
  Administered 2017-09-03: 50 ug via INTRAVENOUS

## 2017-09-03 MED ORDER — CHLORHEXIDINE GLUCONATE 4 % EX LIQD: 60.0000 mL | Freq: Once | CUTANEOUS | Status: DC

## 2017-09-03 MED ORDER — PHENYLEPHRINE 40 MCG/ML (10ML) SYRINGE FOR IV PUSH (FOR BLOOD PRESSURE SUPPORT)
PREFILLED_SYRINGE | INTRAVENOUS | Status: DC | PRN
  Administered 2017-09-03: 80 ug via INTRAVENOUS
  Administered 2017-09-03: 40 ug via INTRAVENOUS
  Administered 2017-09-03 (×2): 80 ug via INTRAVENOUS
  Administered 2017-09-03: 40 ug via INTRAVENOUS
  Administered 2017-09-03 (×3): 80 ug via INTRAVENOUS

## 2017-09-03 MED ORDER — PROPOFOL 10 MG/ML IV BOLUS
INTRAVENOUS | Status: AC
  Filled 2017-09-03: qty 20

## 2017-09-03 MED ORDER — CEFAZOLIN SODIUM 1 G IJ SOLR
INTRAMUSCULAR | Status: AC
  Filled 2017-09-03: qty 30

## 2017-09-03 MED ORDER — ONDANSETRON HCL 4 MG PO TABS: 4.0000 mg | ORAL_TABLET | Freq: Four times a day (QID) | ORAL | Status: DC | PRN

## 2017-09-03 MED ORDER — ONDANSETRON HCL 4 MG/2ML IJ SOLN: 4.0000 mg | Freq: Once | INTRAMUSCULAR | Status: DC | PRN

## 2017-09-03 MED ORDER — FENTANYL CITRATE (PF) 250 MCG/5ML IJ SOLN
INTRAMUSCULAR | Status: AC
  Filled 2017-09-03: qty 5

## 2017-09-03 MED ORDER — TRANEXAMIC ACID 1000 MG/10ML IV SOLN
1000.0000 mg | INTRAVENOUS | Status: AC
  Administered 2017-09-03: 1000 mg via INTRAVENOUS
  Filled 2017-09-03: qty 10

## 2017-09-03 MED ORDER — ROCURONIUM BROMIDE 10 MG/ML (PF) SYRINGE
PREFILLED_SYRINGE | INTRAVENOUS | Status: DC | PRN
  Administered 2017-09-03 (×2): 10 mg via INTRAVENOUS
  Administered 2017-09-03: 20 mg via INTRAVENOUS
  Administered 2017-09-03: 60 mg via INTRAVENOUS

## 2017-09-03 MED ORDER — SUGAMMADEX SODIUM 200 MG/2ML IV SOLN
INTRAVENOUS | Status: DC | PRN
  Administered 2017-09-03: 100 mg via INTRAVENOUS

## 2017-09-03 MED ORDER — POTASSIUM CHLORIDE IN NACL 20-0.9 MEQ/L-% IV SOLN
INTRAVENOUS | Status: AC
  Administered 2017-09-03: 19:00:00 via INTRAVENOUS
  Filled 2017-09-03: qty 1000

## 2017-09-03 MED ORDER — DEXAMETHASONE SODIUM PHOSPHATE 10 MG/ML IJ SOLN
INTRAMUSCULAR | Status: DC | PRN
  Administered 2017-09-03: 10 mg via INTRAVENOUS

## 2017-09-03 MED ORDER — METOCLOPRAMIDE HCL 5 MG PO TABS: 5.0000 mg | ORAL_TABLET | Freq: Three times a day (TID) | ORAL | Status: DC | PRN

## 2017-09-03 MED ORDER — MENTHOL 3 MG MT LOZG: 1.0000 | LOZENGE | OROMUCOSAL | Status: DC | PRN

## 2017-09-03 MED ORDER — SUGAMMADEX SODIUM 200 MG/2ML IV SOLN
INTRAVENOUS | Status: AC
  Filled 2017-09-03: qty 2

## 2017-09-03 MED ORDER — MIDAZOLAM HCL 2 MG/2ML IJ SOLN
INTRAMUSCULAR | Status: AC
  Filled 2017-09-03: qty 2

## 2017-09-03 MED ORDER — PROPOFOL 10 MG/ML IV BOLUS
INTRAVENOUS | Status: DC | PRN
  Administered 2017-09-03: 160 mg via INTRAVENOUS
  Administered 2017-09-03: 40 mg via INTRAVENOUS

## 2017-09-03 MED ORDER — FENTANYL CITRATE (PF) 100 MCG/2ML IJ SOLN
INTRAMUSCULAR | Status: AC
  Filled 2017-09-03: qty 2

## 2017-09-03 MED ORDER — VANCOMYCIN HCL 1000 MG IV SOLR
INTRAVENOUS | Status: DC | PRN
  Administered 2017-09-03: 1000 mg

## 2017-09-03 MED ORDER — ONDANSETRON HCL 4 MG/2ML IJ SOLN: 4.0000 mg | Freq: Four times a day (QID) | INTRAMUSCULAR | Status: DC | PRN

## 2017-09-03 MED ORDER — PHENYLEPHRINE HCL 10 MG/ML IJ SOLN
INTRAVENOUS | Status: DC | PRN
  Administered 2017-09-03: 50 ug/min via INTRAVENOUS

## 2017-09-03 MED ORDER — PHENOL 1.4 % MT LIQD: 1.0000 | OROMUCOSAL | Status: DC | PRN

## 2017-09-03 MED ORDER — ONDANSETRON HCL 4 MG/2ML IJ SOLN
INTRAMUSCULAR | Status: DC | PRN
  Administered 2017-09-03: 4 mg via INTRAVENOUS

## 2017-09-03 MED ORDER — HYDROCODONE-ACETAMINOPHEN 5-325 MG PO TABS
ORAL_TABLET | ORAL | Status: AC
  Administered 2017-09-03: 1 via ORAL
  Filled 2017-09-03: qty 1

## 2017-09-03 MED ORDER — VANCOMYCIN HCL 1000 MG IV SOLR
INTRAVENOUS | Status: AC
  Filled 2017-09-03: qty 1000

## 2017-09-03 MED ORDER — HYDROCODONE-ACETAMINOPHEN 10-325 MG PO TABS
1.0000 | ORAL_TABLET | ORAL | Status: DC | PRN
  Administered 2017-09-03 – 2017-09-06 (×12): 1 via ORAL
  Filled 2017-09-03 (×12): qty 1

## 2017-09-03 MED ORDER — LACTATED RINGERS IV SOLN
INTRAVENOUS | Status: DC | PRN
  Administered 2017-09-03 (×2): via INTRAVENOUS

## 2017-09-03 MED ORDER — SUMATRIPTAN SUCCINATE 50 MG PO TABS: 50.0000 mg | ORAL_TABLET | Freq: Once | ORAL | Status: DC

## 2017-09-03 MED ORDER — METOCLOPRAMIDE HCL 5 MG/ML IJ SOLN: 5.0000 mg | Freq: Three times a day (TID) | INTRAMUSCULAR | Status: DC | PRN

## 2017-09-03 MED ORDER — CEFAZOLIN SODIUM-DEXTROSE 2-4 GM/100ML-% IV SOLN
2.0000 g | Freq: Four times a day (QID) | INTRAVENOUS | Status: AC
  Administered 2017-09-03 – 2017-09-04 (×2): 2 g via INTRAVENOUS
  Filled 2017-09-03 (×2): qty 100

## 2017-09-03 MED ORDER — LIDOCAINE 2% (20 MG/ML) 5 ML SYRINGE
INTRAMUSCULAR | Status: DC | PRN
  Administered 2017-09-03: 40 mg via INTRAVENOUS

## 2017-09-03 MED ORDER — DEXAMETHASONE SODIUM PHOSPHATE 10 MG/ML IJ SOLN
INTRAMUSCULAR | Status: AC
  Filled 2017-09-03: qty 1

## 2017-09-03 MED ORDER — ALBUMIN HUMAN 5 % IV SOLN
INTRAVENOUS | Status: DC | PRN
  Administered 2017-09-03: 13:00:00 via INTRAVENOUS

## 2017-09-03 MED ORDER — AMITRIPTYLINE HCL 75 MG PO TABS
75.0000 mg | ORAL_TABLET | Freq: Every day | ORAL | Status: DC
  Administered 2017-09-03 – 2017-09-05 (×3): 75 mg via ORAL
  Filled 2017-09-03: qty 1
  Filled 2017-09-03: qty 3
  Filled 2017-09-03 (×2): qty 1
  Filled 2017-09-03: qty 3

## 2017-09-03 MED ORDER — GABAPENTIN 300 MG PO CAPS
ORAL_CAPSULE | ORAL | Status: AC
  Administered 2017-09-03: 300 mg via ORAL
  Filled 2017-09-03: qty 1

## 2017-09-03 SURGICAL SUPPLY — 77 items
AID PSTN UNV HD RSTRNT DISP (MISCELLANEOUS) ×1
BIT DRILL 2.7 W/STOP DISP (BIT) ×1 IMPLANT
BIT DRILL 2.7MM W/STOP DISP (BIT) ×1
BIT DRILL F/CENTRAL SCRW 3.2 (BIT) ×1
BIT DRILL F/CENTRAL SCRW 3.2MM (BIT) IMPLANT
BIT DRILL TWIST 2.7 (BIT) ×1 IMPLANT
BIT DRILL TWIST 2.7MM (BIT) ×1
BLADE SAW SGTL 13X75X1.27 (BLADE) ×3 IMPLANT
BSPLAT GLND LRG AUG TPR ADPR (Joint) ×1 IMPLANT
CANISTER SUCT 3000ML PPV (MISCELLANEOUS) ×2 IMPLANT
CAPT SHLDR REVTOTAL 2 ×2 IMPLANT
CEMENT HV SMART SET (Cement) ×2 IMPLANT
CLOSURE STERI-STRIP 1/4X4 (GAUZE/BANDAGES/DRESSINGS) ×4 IMPLANT
CLOSURE WOUND 1/2 X4 (GAUZE/BANDAGES/DRESSINGS) ×1
COMP REV AUG LG W/TAPER/GLENOI (Joint) ×3 IMPLANT
COMPONENT RV AUG LG W/TAPR/GLN (Joint) IMPLANT
COVER SURGICAL LIGHT HANDLE (MISCELLANEOUS) ×3 IMPLANT
DRAPE INCISE IOBAN 66X45 STRL (DRAPES) ×6 IMPLANT
DRAPE U-SHAPE 47X51 STRL (DRAPES) ×6 IMPLANT
DRILL BIT F/CENTRAL SCRW 3.2MM (BIT) ×3
DRSG AQUACEL AG ADV 3.5X10 (GAUZE/BANDAGES/DRESSINGS) ×3 IMPLANT
DURAPREP 26ML APPLICATOR (WOUND CARE) ×6 IMPLANT
ELECT BLADE 4.0 EZ CLEAN MEGAD (MISCELLANEOUS)
ELECT REM PT RETURN 9FT ADLT (ELECTROSURGICAL) ×3
ELECTRODE BLDE 4.0 EZ CLN MEGD (MISCELLANEOUS) IMPLANT
ELECTRODE REM PT RTRN 9FT ADLT (ELECTROSURGICAL) ×1 IMPLANT
GLOVE BIOGEL PI IND STRL 7.5 (GLOVE) ×1 IMPLANT
GLOVE BIOGEL PI IND STRL 8 (GLOVE) ×1 IMPLANT
GLOVE BIOGEL PI INDICATOR 7.5 (GLOVE) ×2
GLOVE BIOGEL PI INDICATOR 8 (GLOVE) ×2
GLOVE ECLIPSE 7.0 STRL STRAW (GLOVE) ×3 IMPLANT
GLOVE SURG ORTHO 8.0 STRL STRW (GLOVE) ×3 IMPLANT
GOWN STRL REUS W/ TWL LRG LVL3 (GOWN DISPOSABLE) ×2 IMPLANT
GOWN STRL REUS W/ TWL XL LVL3 (GOWN DISPOSABLE) ×1 IMPLANT
GOWN STRL REUS W/TWL LRG LVL3 (GOWN DISPOSABLE) ×6
GOWN STRL REUS W/TWL XL LVL3 (GOWN DISPOSABLE) ×3
KIT BASIN OR (CUSTOM PROCEDURE TRAY) ×3 IMPLANT
KIT BEACH CHAIR TRIMANO (MISCELLANEOUS) ×3 IMPLANT
KIT ROOM TURNOVER OR (KITS) ×3 IMPLANT
MANIFOLD NEPTUNE II (INSTRUMENTS) ×1 IMPLANT
MODEL GLENOID TOTAL SIGNATURE (SYSTAGENIX WOUND MANAGEMENT) ×2 IMPLANT
NDL HYPO 25GX1X1/2 BEV (NEEDLE) IMPLANT
NDL SUT 6 .5 CRC .975X.05 MAYO (NEEDLE) ×1 IMPLANT
NEEDLE HYPO 25GX1X1/2 BEV (NEEDLE) IMPLANT
NEEDLE MAYO TAPER (NEEDLE) ×3
NS IRRIG 1000ML POUR BTL (IV SOLUTION) ×5 IMPLANT
PACK SHOULDER (CUSTOM PROCEDURE TRAY) ×3 IMPLANT
PAD ARMBOARD 7.5X6 YLW CONV (MISCELLANEOUS) ×6 IMPLANT
PASSER SUT SWANSON 36MM LOOP (INSTRUMENTS) ×2 IMPLANT
PIN THREADED REVERSE (PIN) ×2 IMPLANT
REAMER GUIDE BUSHING SURG DISP (MISCELLANEOUS) ×2 IMPLANT
REAMER GUIDE W/SCREW AUG (MISCELLANEOUS) ×2 IMPLANT
RESTRAINT HEAD UNIVERSAL NS (MISCELLANEOUS) ×3 IMPLANT
SLING ARM IMMOBILIZER XL (CAST SUPPLIES) ×2 IMPLANT
SPONGE LAP 18X18 X RAY DECT (DISPOSABLE) ×3 IMPLANT
SPONGE LAP 4X18 X RAY DECT (DISPOSABLE) ×2 IMPLANT
STRIP CLOSURE SKIN 1/2X4 (GAUZE/BANDAGES/DRESSINGS) ×2 IMPLANT
SUCTION FRAZIER HANDLE 10FR (MISCELLANEOUS) ×2
SUCTION TUBE FRAZIER 10FR DISP (MISCELLANEOUS) ×1 IMPLANT
SUT FIBERWIRE #2 38 T-5 BLUE (SUTURE) ×3
SUT MAXBRAID (SUTURE) IMPLANT
SUT MNCRL AB 3-0 PS2 18 (SUTURE) ×3 IMPLANT
SUT VIC AB 0 CT1 27 (SUTURE) ×6
SUT VIC AB 0 CT1 27XBRD ANBCTR (SUTURE) ×2 IMPLANT
SUT VIC AB 1 CT1 27 (SUTURE) ×15
SUT VIC AB 1 CT1 27XBRD ANBCTR (SUTURE) ×1 IMPLANT
SUT VIC AB 2-0 CT1 27 (SUTURE) ×6
SUT VIC AB 2-0 CT1 TAPERPNT 27 (SUTURE) ×2 IMPLANT
SUT VICRYL 0 UR6 27IN ABS (SUTURE) ×14 IMPLANT
SUTURE FIBERWR #2 38 T-5 BLUE (SUTURE) ×1 IMPLANT
SYR CONTROL 10ML LL (SYRINGE) IMPLANT
TAPE FIBER 2MM 7IN #2 BLUE (SUTURE) ×6 IMPLANT
TOWEL OR 17X24 6PK STRL BLUE (TOWEL DISPOSABLE) ×3 IMPLANT
TOWEL OR 17X26 10 PK STRL BLUE (TOWEL DISPOSABLE) ×3 IMPLANT
TRAY FOLEY BAG SILVER LF 16FR (CATHETERS) IMPLANT
TRAY FOLEY BAG SILVER LF 16FR (SET/KITS/TRAYS/PACK) ×2 IMPLANT
WATER STERILE IRR 1000ML POUR (IV SOLUTION) ×1 IMPLANT

## 2017-09-03 NOTE — Brief Op Note (Signed)
09/03/2017  1:36 PM  PATIENT:  [name]  66 y.o. male  PRE-OPERATIVE DIAGNOSIS:  LEFT SHOULDER OSTEOARTHRITIS  POST-OPERATIVE DIAGNOSIS:  LEFT SHOULDER OSTEOARTHRITIS  PROCEDURE:  Procedure(s): REVERSE SHOULDER ARTHROPLASTY  SURGEON:  Surgeon(s): [name] Sa, Tonna Corner, MD  ASSISTANT: Laure Kidney RNFA  ANESTHESIA:   general  EBL: 450 ml    Total I/O In: 1750 [I.V.:1500; IV Piggyback:250] Out: 2081 [Urine:900; Blood:550]  BLOOD ADMINISTERED: none  DRAINS: none   LOCAL MEDICATIONS USED:  none  SPECIMEN:  No Specimen  COUNTS:  YES  TOURNIQUET:  * No tourniquets in log *  DICTATION: .Other Dictation: Dictation Number [id number]  PLAN OF CARE: Admit to inpatient   PATIENT DISPOSITION:  PACU - hemodynamically stable

## 2017-09-03 NOTE — Anesthesia Procedure Notes (Signed)
Anesthesia Regional Block: Interscalene brachial plexus block   Pre-Anesthetic Checklist: ,, timeout performed, Correct Patient, Correct Site, Correct Laterality, Correct Procedure, Correct Position, site marked, Risks and benefits discussed,  Surgical consent,  Pre-op evaluation,  At surgeon's request and post-op pain management  Laterality: Left  Prep: chloraprep       Needles:  Injection technique: Single-shot  Needle Type: Echogenic Needle     Needle Length: 9cm  Needle Gauge: 21     Additional Needles:   Narrative:  Start time: 09/03/2017 6:59 AM End time: 09/03/2017 7:03 AM Injection made incrementally with aspirations every 5 mL.  Performed by: Personally  Anesthesiologist: Audry Pili, MD  Additional Notes: No pain on injection. No increased resistance to injection. Injection made in 5cc increments. Good needle visualization. Patient tolerated the procedure well.

## 2017-09-03 NOTE — Interval H&P Note (Signed)
History and Physical Interval Note:  09/03/2017 7:30 AM  Nicholas [name]  has presented today for surgery, with the diagnosis of LEFT SHOULDER OSTEOARTHRITIS  The various methods of treatment have been discussed with the patient and family. After consideration of risks, benefits and other options for treatment, the patient has consented to  Procedure(s): LEFT TOTAL SHOULDER ARTHROPLASTY (Left) as a surgical intervention .  The patient's history has been reviewed, patient examined, no change in status, stable for surgery.  I have reviewed the patient's chart and labs.  Questions were answered to the patient's satisfaction.     Anderson Malta

## 2017-09-03 NOTE — Anesthesia Postprocedure Evaluation (Signed)
Anesthesia Post Note  Patient: Nicholas [name]  Procedure(s) Performed: REVERSE SHOULDER ARTHROPLASTY (Left Shoulder)     Patient location during evaluation: PACU Anesthesia Type: General Level of consciousness: awake and alert Pain management: pain level controlled Vital Signs Assessment: post-procedure vital signs reviewed and stable Respiratory status: spontaneous breathing, nonlabored ventilation, respiratory function stable and patient connected to nasal cannula oxygen Cardiovascular status: blood pressure returned to baseline and stable Postop Assessment: no apparent nausea or vomiting Anesthetic complications: no    Last Vitals:  Vitals:   09/03/17 1408 09/03/17 1423  BP: 130/76 131/79  Pulse: (!) 104 100  Resp: (!) 23 20  Temp:    SpO2: 92% 92%    Last Pain:  Vitals:   09/03/17 1408  TempSrc:   PainSc: [place]

## 2017-09-03 NOTE — Transfer of Care (Signed)
Immediate Anesthesia Transfer of Care Note  Patient: TRELYN VANDERLINDE  Procedure(s) Performed: REVERSE SHOULDER ARTHROPLASTY (Left Shoulder)  Patient Location: PACU  Anesthesia Type:GA combined with regional for post-op pain  Level of Consciousness: awake, oriented and patient cooperative  Airway & Oxygen Therapy: Patient Spontanous Breathing and Patient connected to nasal cannula oxygen  Post-op Assessment: Report given to RN, Post -op Vital signs reviewed and stable and Patient moving all extremities  Post vital signs: Reviewed and stable  Last Vitals:  Vitals:   09/03/17 0616  BP: (!) 166/81  Pulse: 87  Resp: 18  Temp: 36.7 C  SpO2: 95%    Last Pain:  Vitals:   09/03/17 0616  TempSrc: Oral         Complications: No apparent anesthesia complications

## 2017-09-03 NOTE — Anesthesia Procedure Notes (Signed)
Procedure Name: Intubation Date/Time: 09/03/2017 7:46 AM Performed by: Moshe Salisbury, CRNA Pre-anesthesia Checklist: Patient identified, Emergency Drugs available, Suction available and Patient being monitored Patient Re-evaluated:Patient Re-evaluated prior to induction Oxygen Delivery Method: Circle System Utilized Preoxygenation: Pre-oxygenation with 100% oxygen Induction Type: IV induction Ventilation: Mask ventilation without difficulty Laryngoscope Size: Mac Grade View: Grade I Tube type: Oral Tube size: 8.0 mm Number of attempts: 1 Airway Equipment and Method: Stylet Placement Confirmation: ETT inserted through vocal cords under direct vision,  positive ETCO2 and breath sounds checked- equal and bilateral Secured at: 22 cm Tube secured with: Tape Dental Injury: Teeth and Oropharynx as per pre-operative assessment

## 2017-09-04 ENCOUNTER — Other Ambulatory Visit: Payer: Self-pay

## 2017-09-04 MED ORDER — LEVOTHYROXINE SODIUM 25 MCG PO TABS
25.0000 ug | ORAL_TABLET | Freq: Every day | ORAL | Status: DC
  Administered 2017-09-04 – 2017-09-06 (×3): 25 ug via ORAL
  Filled 2017-09-04 (×3): qty 1

## 2017-09-04 NOTE — Progress Notes (Signed)
Subjective: Pt stable - pain ok - block just starting to wear off   Objective: Vital signs in last 24 hours: Temp:  [97.9 F (36.6 C)-99.7 F (37.6 C)] 97.9 F (36.6 C) (11/21 0534) Pulse Rate:  [90-108] 90 (11/21 0534) Resp:  [12-23] 18 (11/21 0534) BP: (126-142)/(67-94) 133/94 (11/21 0534) SpO2:  [92 %-96 %] 92 % (11/21 0534)  Intake/Output from previous day: 11/20 0701 - 11/21 0700 In: 3009.4 [P.O.:240; I.V.:2159.4; IV Piggyback:460] Out: 2450 [Urine:1900; Blood:550] Intake/Output this shift: Total I/O In: -  Out: 800 [Urine:800]  Exam:  Intact pulses distally  Labs: No results for input(s): HGB in the last 72 hours. No results for input(s): WBC, RBC, HCT, PLT in the last 72 hours. No results for input(s): NA, K, CL, CO2, BUN, CREATININE, GLUCOSE, CALCIUM in the last 72 hours. No results for input(s): LABPT, INR in the last 72 hours.  Assessment/Plan: Plan OT today - need sw consult for placement   G Alphonzo Severance 09/04/2017, 7:39 AM

## 2017-09-04 NOTE — Evaluation (Signed)
Occupational Therapy Evaluation Patient Details Name: Nicholas [name] MRN: [id number] DOB: 1951/08/22 Today's Date: 09/04/2017    History of Present Illness 66 y.o. Male s/p L shoulder replacement. PHM including arthritis, CHF, depression, and Bil TKA with L knee in extension.    Clinical Impression   PTA, pt was living alone and was independent. Pt currently requiring Mod-Max A for UB and LB ADLs and Min A for functional mobility. Provided education and handout on shoulder precautions, exercises, edema management, UB ADLs, and sling management. Pt would benefit form further acute OT to facilitate safe dc. Recommend dc to SNF for further OT to increase safety and independence with ADLs and functional mobility before returning home alone.     Follow Up Recommendations  SNF    Equipment Recommendations  Other (comment)(Defer to next venue)    Recommendations for Other Services       Precautions / Restrictions Precautions Precautions: Shoulder Type of Shoulder Precautions: Passive protocal: FF 0-90, AB 0-60, and ER 0-30; no active shoulder ROM Shoulder Interventions: Shoulder sling/immobilizer;At all times;Off for dressing/bathing/exercises Precaution Booklet Issued: Yes (comment) Precaution Comments: Reviewed all information on shoulder handout Required Braces or Orthoses: Sling Restrictions Weight Bearing Restrictions: Yes LUE Weight Bearing: Non weight bearing      Mobility Bed Mobility Overal bed mobility: Needs Assistance Bed Mobility: Supine to Sit     Supine to sit: HOB elevated;Min guard     General bed mobility comments: Min Guard for safety. Exiting bed to R  Transfers Overall transfer level: Needs assistance Equipment used: 1 person hand held assist Transfers: Sit to/from Stand Sit to Stand: Min assist;From elevated surface         General transfer comment: Min A for stability in standing.    Balance Overall balance assessment: Needs  assistance Sitting-balance support: No upper extremity supported;Feet supported Sitting balance-Leahy Scale: Good     Standing balance support: Single extremity supported;During functional activity Standing balance-Leahy Scale: Fair Standing balance comment: Able to maintain static standing balance without UE support                           ADL either performed or assessed with clinical judgement   ADL Overall ADL's : Needs assistance/impaired Eating/Feeding: Minimal assistance;Sitting   Grooming: Minimal assistance;Sitting   Upper Body Bathing: Moderate assistance;Sitting;Cueing for compensatory techniques;Cueing for UE precautions   Lower Body Bathing: Moderate assistance;Sit to/from stand   Upper Body Dressing : Maximal assistance;Sitting Upper Body Dressing Details (indicate cue type and reason): Max A for managing sling. Providing education for donning/doffing sling. Educated pt on compensatory techniques for donning shirts and appropriate shirts to optimzie independence. Lower Body Dressing: Maximal assistance;Sit to/from stand   Toilet Transfer: Minimal assistance;Ambulation(Simulated to recliner) [employer] forces technical officer Details (indicate cue type and reason): Min A for careful descent and balance          Functional mobility during ADLs: Minimal assistance General ADL Comments: Pt demonstrating decreased funcitonal performane due to ROM limitations and pain. Pt highly motivated to participate in therapy. Reviewing all information on shoulder handout including bathing, dressing, sling managment, excercises, and sleep positioning.     Vision         Perception     Praxis      Pertinent Vitals/Pain Pain Assessment: Faces Faces Pain Scale: Hurts even more Pain Location: L shoulder Pain Descriptors / Indicators: Constant;Discomfort;Grimacing;Guarding;Operative site guarding Pain Intervention(s): Monitored during session;Limited activity within patient's  tolerance;Repositioned  Hand Dominance Right   Extremity/Trunk Assessment Upper Extremity Assessment Upper Extremity Assessment: LUE deficits/detail LUE Deficits / Details: s/p L shoulder replacement LUE: Unable to fully assess due to immobilization LUE Coordination: decreased fine motor;decreased gross motor   Lower Extremity Assessment Lower Extremity Assessment: RLE deficits/detail;LLE deficits/detail RLE Deficits / Details: Prior TKA LLE Deficits / Details: L leg placed in extension with rod at baseline LLE Coordination: decreased gross motor   Cervical / Trunk Assessment Cervical / Trunk Assessment: Other exceptions Cervical / Trunk Exceptions: Increased body habitus   Communication Communication Communication: No difficulties   Cognition Arousal/Alertness: Awake/alert Behavior During Therapy: WFL for tasks assessed/performed Overall Cognitive Status: Within Functional Limits for tasks assessed                                     General Comments       Exercises Exercises: Shoulder Shoulder Exercises Pendulum Exercise: (NA) Shoulder Flexion: Left;10 reps;Seated;Other (comment);PROM(0-90; educating on no active shoulder ROM) Shoulder Extension: Left;10 reps;Seated;PROM(0-90; educating on no active shoulder ROM) Shoulder ABduction: PROM;Left;10 reps;Seated(0-60; educating on no active shoulder ROM) Shoulder External Rotation: PROM;Left;Seated;10 reps(0-60; educating on no active shoulder ROM) Elbow Flexion: AROM;Left;15 reps;Seated Elbow Extension: AROM;Left;15 reps;Seated Wrist Flexion: AROM;Left;15 reps;Seated Wrist Extension: AROM;Left;15 reps;Seated Digit Composite Flexion: AROM;Left;15 reps;Seated Composite Extension: AROM;Left;15 reps;Seated Neck Flexion: AROM;5 reps;Seated Neck Extension: AROM;5 reps;Seated Neck Lateral Flexion - Right: AROM;5 reps;Supine;Seated Neck Lateral Flexion - Left: AROM;5 reps;Seated   Shoulder Instructions  Shoulder Instructions Donning/doffing shirt without moving shoulder: Maximal assistance Method for sponge bathing under operated UE: Moderate assistance Donning/doffing sling/immobilizer: Maximal assistance Correct positioning of sling/immobilizer: Maximal assistance ROM for elbow, wrist and digits of operated UE: Supervision/safety Sling wearing schedule (on at all times/off for ADL's): Supervision/safety Proper positioning of operated UE when showering: Min-guard Positioning of UE while sleeping: Minimal assistance    Home Living Family/patient expects to be discharged to:: Skilled nursing facility Living Arrangements: Alone                                      Prior Functioning/Environment Level of Independence: Independent                 OT Problem List: Decreased range of motion;Decreased activity tolerance;Impaired balance (sitting and/or standing);Decreased knowledge of use of DME or AE;Decreased knowledge of precautions;Pain;Impaired UE functional use      OT Treatment/Interventions: Self-care/ADL training;Therapeutic exercise;Energy conservation;DME and/or AE instruction;Therapeutic activities;Patient/family education    OT Goals(Current goals can be found in the care plan section) Acute Rehab OT Goals Patient Stated Goal: Go home OT Goal Formulation: With patient Time For Goal Achievement: 09/18/17 Potential to Achieve Goals: Good ADL Goals Pt Will Perform Upper Body Bathing: with set-up;with supervision;with adaptive equipment;sitting Pt Will Perform Upper Body Dressing: with set-up;with supervision;sitting Pt Will Perform Lower Body Dressing: with set-up;with supervision;sit to/from stand;with adaptive equipment Pt/caregiver will Perform Home Exercise Program: Left upper extremity;Independently;With written HEP provided  OT Frequency: Min 2X/week   Barriers to D/C:            Co-evaluation              AM-PAC PT "6 Clicks" Daily  Activity     Outcome Measure Help from another person eating meals?: A Little Help from another person taking care of personal grooming?: A Little Help  from another person toileting, which includes using toliet, bedpan, or urinal?: A Lot Help from another person bathing (including washing, rinsing, drying)?: A Lot Help from another person to put on and taking off regular upper body clothing?: A Lot Help from another person to put on and taking off regular lower body clothing?: A Lot 6 Click Score: 14   End of Session Equipment Utilized During Treatment: Other (comment)(Sling) Nurse Communication: Mobility status  Activity Tolerance: Patient tolerated treatment well;Patient limited by pain Patient left: in chair;with call bell/phone within reach;with family/visitor present  OT Visit Diagnosis: Unsteadiness on feet (R26.81);Other abnormalities of gait and mobility (R26.89);Muscle weakness (generalized) (M62.81);Pain Pain - Right/Left: Left Pain - part of body: Shoulder                Time: 1610-9604 OT Time Calculation (min): 39 min Charges:  OT General Charges $OT Visit: 1 Visit OT Evaluation $OT Eval Moderate Complexity: 1 Mod OT Treatments $Self Care/Home Management : 23-37 mins G-Codes:     [place], OTR/L Acute Rehab Pager: [phone] Office: [place] 09/04/2017, 4:49 PM

## 2017-09-04 NOTE — Progress Notes (Signed)
Bladder scanned patient, 374mL noted. Patient is resting comfortably with no urge to void. Will continue to monitor. Will recheck in 2 hours per protocol.

## 2017-09-04 NOTE — Evaluation (Signed)
Physical Therapy Evaluation Patient Details Name: Nicholas [name] MRN: [id number] DOB: 1951/10/13 Today's Date: 09/04/2017   History of Present Illness  66 y.o. Male s/p L shoulder replacement. PHM including arthritis, CHF, depression, and Bil TKA with L knee fused.  Clinical Impression  Pt with significant difficulty with transition to stand with only one good arm and one good leg.  He would benefit from SNF placement for rehab until he gets stronger and better able to manage in his two story home without assistance.  PT to follow acutely for deficits listed below.       Follow Up Recommendations SNF    Equipment Recommendations  None recommended by PT    Recommendations for Other Services   NA    Precautions / Restrictions Precautions Precautions: Shoulder Type of Shoulder Precautions: Passive protocal: FF 0-90, AB 0-60, and ER 0-30 Shoulder Interventions: Shoulder sling/immobilizer;At all times;Off for dressing/bathing/exercises Precaution Booklet Issued: Yes (comment) Precaution Comments: Reviewed all information on shoulder handout Required Braces or Orthoses: Sling Restrictions Weight Bearing Restrictions: Yes LUE Weight Bearing: Non weight bearing      Mobility  Bed Mobility Overal bed mobility: Needs Assistance Bed Mobility: Supine to Sit     Supine to sit: HOB elevated;Min guard     General bed mobility comments: Pt is OOB in the recliner chair.   Transfers Overall transfer level: Needs assistance Equipment used: None Transfers: Sit to/from Stand Sit to Stand: Mod assist         General transfer comment: Mod assist to stand from the recliner chair using momentum and it took three tries to get up.    Ambulation/Gait Ambulation/Gait assistance: Min assist Ambulation Distance (Feet): 200 Feet Assistive device: 1 person hand held assist Gait Pattern/deviations: Step-through pattern;Staggering left;Staggering right     General Gait Details: Min hand  held assist for balance.        Balance Overall balance assessment: Needs assistance Sitting-balance support: Feet supported;No upper extremity supported Sitting balance-Leahy Scale: Good Sitting balance - Comments: Reliant on right arm to get to sitting with back off of chair, but once there he is supervision   Standing balance support: Single extremity supported Standing balance-Leahy Scale: Fair Standing balance comment: pt has significant sway with no hand support needing close supervision for balance.                              Pertinent Vitals/Pain Pain Assessment: 0-10 Pain Score: 5  Faces Pain Scale: Hurts even more Pain Location: L shoulder Pain Descriptors / Indicators: Discomfort;Grimacing;Guarding Pain Intervention(s): Limited activity within patient's tolerance;Monitored during session;Repositioned;Ice applied    Home Living Family/patient expects to be discharged to:: Skilled nursing facility Living Arrangements: Alone               Additional Comments: Pt does not have 24/7 assist at discharge.     Prior Function Level of Independence: Independent         Comments: drives     Hand Dominance   Dominant Hand: Right    Extremity/Trunk Assessment   Upper Extremity Assessment Upper Extremity Assessment: Defer to OT evaluation LUE Deficits / Details: s/p L shoulder replacement LUE: Unable to fully assess due to immobilization LUE Coordination: decreased fine motor;decreased gross motor    Lower Extremity Assessment Lower Extremity Assessment: RLE deficits/detail RLE Deficits / Details: right leg is fused in extension with no flexion ROM and 0 degrees of extension.  Ankle at least 3/5, and hip flexion at least 3/5.   LLE Deficits / Details: Left leg with recent (this year) TKA and it is not strong enough to push his entire body weight up from sitting with only one arm and the opposite leg.   LLE Coordination: decreased gross motor     Cervical / Trunk Assessment Cervical / Trunk Assessment: Other exceptions Cervical / Trunk Exceptions: Pt reports chronic low back pain  Communication   Communication: No difficulties  Cognition Arousal/Alertness: Awake/alert Behavior During Therapy: WFL for tasks assessed/performed Overall Cognitive Status: Within Functional Limits for tasks assessed                                           Exercises Shoulder Exercises Pendulum Exercise: (NA) Shoulder Flexion: Left;10 reps;Seated;Other (comment);PROM(0-90; educating on no active shoulder ROM) Shoulder Extension: Left;10 reps;Seated;PROM(0-90; educating on no active shoulder ROM) Shoulder ABduction: PROM;Left;10 reps;Seated(0-60; educating on no active shoulder ROM) Shoulder External Rotation: PROM;Left;Seated;10 reps(0-60; educating on no active shoulder ROM) Elbow Flexion: AROM;Left;15 reps;Seated Elbow Extension: AROM;Left;15 reps;Seated Wrist Flexion: AROM;Left;15 reps;Seated Wrist Extension: AROM;Left;15 reps;Seated Digit Composite Flexion: AROM;Left;15 reps;Seated Composite Extension: AROM;Left;15 reps;Seated Neck Flexion: AROM;5 reps;Seated Neck Extension: AROM;5 reps;Seated Neck Lateral Flexion - Right: AROM;5 reps;Supine;Seated Neck Lateral Flexion - Left: AROM;5 reps;Seated Donning/doffing shirt without moving shoulder: Maximal assistance Method for sponge bathing under operated UE: Moderate assistance Donning/doffing sling/immobilizer: Maximal assistance Correct positioning of sling/immobilizer: Maximal assistance ROM for elbow, wrist and digits of operated UE: Supervision/safety Sling wearing schedule (on at all times/off for ADL's): Supervision/safety Proper positioning of operated UE when showering: Min-guard Positioning of UE while sleeping: Minimal assistance   Assessment/Plan    PT Assessment Patient needs continued PT services  PT Problem List Decreased strength;Decreased range of  motion;Decreased activity tolerance;Decreased balance;Decreased mobility;Decreased knowledge of use of DME;Pain;Obesity       PT Treatment Interventions DME instruction;Gait training;Stair training;Functional mobility training;Therapeutic activities;Therapeutic exercise;Balance training;Neuromuscular re-education;Patient/family education;Modalities    PT Goals (Current goals can be found in the Care Plan section)  Acute Rehab PT Goals Patient Stated Goal: to get well enough to go home PT Goal Formulation: With patient Time For Goal Achievement: 09/11/17 Potential to Achieve Goals: Good    Frequency Min 3X/week   Barriers to discharge Decreased caregiver support;Inaccessible home environment 17 steps to get up to his kitchen       AM-PAC PT "6 Clicks" Daily Activity  Outcome Measure Difficulty turning over in bed (including adjusting bedclothes, sheets and blankets)?: Unable Difficulty moving from lying on back to sitting on the side of the bed? : Unable Difficulty sitting down on and standing up from a chair with arms (e.g., wheelchair, bedside commode, etc,.)?: Unable Help needed moving to and from a bed to chair (including a wheelchair)?: A Little Help needed walking in hospital room?: A Little Help needed climbing 3-5 steps with a railing? : A Lot 6 Click Score: 11    End of Session Equipment Utilized During Treatment: Gait belt;Other (comment)(left arm sling) Activity Tolerance: Patient limited by pain Patient left: in chair;with call bell/phone within reach Nurse Communication: Mobility status PT Visit Diagnosis: Difficulty in walking, not elsewhere classified (R26.2);Pain Pain - Right/Left: Left Pain - part of body: Shoulder    Time: 1610-9604 PT Time Calculation (min) (ACUTE ONLY): 32 min   Charges:        Lurena Joiner B.  Tyjay Galindo, PT, DPT [phone]   PT Evaluation $PT Eval Moderate Complexity: 1 Mod PT Treatments $Gait Training: 8-22 mins   09/04/2017,  5:54 PM

## 2017-09-04 NOTE — Care Management Note (Signed)
Case Management Note  Patient Details  Name: RAHM MINIX MRN: [id number] Date of Birth: 1950-12-20  Subjective/Objective:                    Action/Plan:   Expected Discharge Date:                  Expected Discharge Plan:  Skilled Nursing Facility  In-House Referral:  Clinical Social Work  Discharge planning Services     Post Acute Care Choice:    Choice offered to:     DME Arranged:    DME Agency:     HH Arranged:    [place] Agency:     Status of Service:  In process, will continue to follow  If discussed at Long Length of Stay Meetings, dates discussed:    Additional Comments:  Marilu Favre, RN 09/04/2017, 11:01 AM

## 2017-09-04 NOTE — Op Note (Signed)
NAME:  Nicholas [name], AL NO.:  [id number]  MEDICAL RECORD NO.:  [id number]  LOCATION:                                 FACILITY:  PHYSICIAN:  Anderson Malta, M.D.         DATE OF BIRTH:  DATE OF PROCEDURE:  09/03/2017 DATE OF DISCHARGE:                              OPERATIVE REPORT   PREOPERATIVE DIAGNOSIS:  Left shoulder arthritis with severe preoperative glenoid deformity.  POSTOPERATIVE DIAGNOSIS:  Left shoulder arthritis with severe preoperative glenoid deformity.  PROCEDURE:  Left reverse shoulder replacement using Biomet augmented glenoid with the maximal offset, small glenosphere, size 15 press-fit mini stem, and 42 mm cup.  SURGEON:  Anderson Malta, M.D.  ASSISTANT:  Laure Kidney, RNFA.  INDICATIONS:  [name] is a 66 year old patient with left shoulder arthritis, presents for operative management after explanation of risks and benefits.  He had nearly 30 degrees of posterior wear and patient specific instrumentation was utilized in order to optimize pin placement and component position placement.  PROCEDURE IN DETAIL:  The patient was brought to the operating room, where general anesthetic was induced, preoperative antibiotics were administered.  Time-out was called.  Left arm, shoulder, and hand region were prescrubbed with first 3% hydrogen peroxide, then alcohol which was allowed to air dry, then Betadine which was allowed to air dry.  Then, the entire area was prepped with DuraPrep solution and draped in a sterile manner using Ioban.  Time-out was called.  The patient had external rotation to neutral only.  At this time, the deltopectoral approach was made.  Skin and subcutaneous tissue were sharply divided. Cephalic vein was mobilized medially.  The Kolbel retractor was placed between the deltoid laterally and the conjoint tendon medially.  Care was taken to avoid undue pressure on the undersurface of the long head of the conjoint tendon.  At  this time, the biceps tendon was identified. The deltoid was released from the adhesions in the subacromial space and Cobb was used to release the anterior third of the deltoid to allow for relaxation of the deltoid during exposure.  The biceps tendon was identified and tenodesed to the pectoralis major, which was also partially released upper half in order to facilitate extension.  The biceps tendon was tenodesed to the remaining pectoralis tendon using #1 Vicryl suture.  Biceps tendon was then used to guide full release of the rotator interval as well as the coracohumeral ligament.  This was done under direct visualization with the cautery.  At this time, the circumflex vessels were tied using silk ligature.  The axillary artery was then palpated, visualized, tagged with a vessel loop, and protected at all times during the case.  The subscapularis was then detached from the lesser tuberosity.  This was done in a peel type fashion. Dissection was taken around to the 7 o'clock position on the left humeral head up to the teres minor attachment.  At this time, subscapularis was tagged.  A full release was performed.  The capsule was then released from the 5 o'clock position on the glenoid side to the released inferior capsule off the medial aspect of the proximal humerus.  My finger was on the axillary nerve while this cauterization was being performed.  The patient was not paralyzed during this portion of the exposure.  A 360 release was then performed with additional release of the triceps on the undersurface of the scapula.  The capsule and labrum were then released circumferentially around the glenoid.  Significant glenoid deformity was present.  It should be noted that the head was also cut prior to doing the glenoid exposure.  Head was cut in 20 degrees of retroversion down to the rotator cuff attachment site on the greater tuberosity.  The head was recut to facilitate exposure.   Metal cap was placed.  Then, the exposure steps were taken on the glenoid. The glenoid was exposed and did take time in order to achieve exposure to place the components.  At this time, the guide pin was placed with the patient's specific guide.  Reaming was then performed and the small glenosphere was prepared.  Most of the offset, which was the largest amount of offset possible was required.  This was primarily posterior, superior in the 2:30, 3 o'clock position on the glenoid.  Trial glenoid was placed.  Good contact was achieved with the implant.  Following trial placement, the true implant was placed.  Good bleeding bone was encountered around the contact surface area for the offset glenosphere base plate.  True base plate was then placed with about 2 mm offset superiorly due to the general inferior position of the base plate to begin with.  Good press-fit was obtained.  At this time, attention was then redirected towards the humerus.  In about 20 to 30 degrees of retroversion, the stem was broached.  Size 16 broach was placed, but it was difficult to extract.  Therefore, size 15 which matched the 16 broach size was placed with excellent press-fit obtained.  The glenosphere placed was standard glenosphere.  A small tray was placed and it was difficult to reduce.  Restored good tension on the conjoint tendon.  There was no inferior impingement.  Good coverage of the resected humeral surface.  The patient had forward flexion and adduction, both about 90 degrees with good stability.  With some difficulty, the humerus was dislocated.  The supraspinatus was intact and maintained.  Following this, true components were placed with same stability parameters maintained.  Inferior shuck and lateral shuck less than 2 mm.  Good reduction was again performed.  Thorough irrigation was then performed.  Axillary nerve palpated and intact.  At this time, vancomycin powder placed within the arthrotomy  incision.  In 30 degrees of external rotation, the subscapularis was reattached through drill holes using 3 FiberTape sutures through the muscle tendon junction. Thorough irrigation again performed and the deltopectoral split was closed using #1 Vicryl suture followed by interrupted inverted 0 Vicryl suture, 2-0 Vicryl suture, and a 3-0 Monocryl.  Aquacel dressing and shoulder immobilizer placed.  The patient tolerated the procedure well without immediate complications, transferred to the recovery room in stable condition.     Anderson Malta, M.D.   ______________________________ Darnell Level. Alphonzo Severance, M.D.    GSD/MEDQ  D:  09/03/2017  T:  09/04/2017  Job:  [id number]

## 2017-09-05 NOTE — Progress Notes (Signed)
Patient is today status post left total shoulder replacement.  He is stable today.  Recommendations are for skilled nursing facility placement.  Patient wishes to go home with hospice he does understand that he needs to do a little bit better with therapy.  Dressing is clean dry and intact.  Follow-up in 2 weeks with Dr. [name] Sa

## 2017-09-06 ENCOUNTER — Encounter (HOSPITAL_COMMUNITY): Payer: Self-pay | Admitting: Orthopedic Surgery

## 2017-09-06 DIAGNOSIS — Z89412 Acquired absence of left great toe: Secondary | ICD-10-CM | POA: Diagnosis not present

## 2017-09-06 DIAGNOSIS — Z6839 Body mass index (BMI) 39.0-39.9, adult: Secondary | ICD-10-CM | POA: Diagnosis not present

## 2017-09-06 DIAGNOSIS — F419 Anxiety disorder, unspecified: Secondary | ICD-10-CM | POA: Diagnosis not present

## 2017-09-06 DIAGNOSIS — Z96651 Presence of right artificial knee joint: Secondary | ICD-10-CM | POA: Diagnosis not present

## 2017-09-06 DIAGNOSIS — M79602 Pain in left arm: Secondary | ICD-10-CM | POA: Diagnosis not present

## 2017-09-06 DIAGNOSIS — M25511 Pain in right shoulder: Secondary | ICD-10-CM | POA: Diagnosis not present

## 2017-09-06 DIAGNOSIS — M6281 Muscle weakness (generalized): Secondary | ICD-10-CM | POA: Diagnosis not present

## 2017-09-06 DIAGNOSIS — M25512 Pain in left shoulder: Secondary | ICD-10-CM | POA: Diagnosis not present

## 2017-09-06 DIAGNOSIS — Z8673 Personal history of transient ischemic attack (TIA), and cerebral infarction without residual deficits: Secondary | ICD-10-CM | POA: Diagnosis not present

## 2017-09-06 DIAGNOSIS — Z471 Aftercare following joint replacement surgery: Secondary | ICD-10-CM | POA: Diagnosis not present

## 2017-09-06 DIAGNOSIS — Z9889 Other specified postprocedural states: Secondary | ICD-10-CM | POA: Diagnosis not present

## 2017-09-06 DIAGNOSIS — R2681 Unsteadiness on feet: Secondary | ICD-10-CM | POA: Diagnosis not present

## 2017-09-06 DIAGNOSIS — M170 Bilateral primary osteoarthritis of knee: Secondary | ICD-10-CM | POA: Diagnosis not present

## 2017-09-06 DIAGNOSIS — M19012 Primary osteoarthritis, left shoulder: Secondary | ICD-10-CM | POA: Diagnosis not present

## 2017-09-06 DIAGNOSIS — D509 Iron deficiency anemia, unspecified: Secondary | ICD-10-CM | POA: Diagnosis not present

## 2017-09-06 DIAGNOSIS — Z96652 Presence of left artificial knee joint: Secondary | ICD-10-CM | POA: Diagnosis not present

## 2017-09-06 DIAGNOSIS — I252 Old myocardial infarction: Secondary | ICD-10-CM | POA: Diagnosis not present

## 2017-09-06 DIAGNOSIS — E669 Obesity, unspecified: Secondary | ICD-10-CM | POA: Diagnosis not present

## 2017-09-06 DIAGNOSIS — G894 Chronic pain syndrome: Secondary | ICD-10-CM | POA: Diagnosis not present

## 2017-09-06 DIAGNOSIS — R262 Difficulty in walking, not elsewhere classified: Secondary | ICD-10-CM | POA: Diagnosis not present

## 2017-09-06 DIAGNOSIS — F339 Major depressive disorder, recurrent, unspecified: Secondary | ICD-10-CM | POA: Diagnosis not present

## 2017-09-06 DIAGNOSIS — R5381 Other malaise: Secondary | ICD-10-CM | POA: Diagnosis not present

## 2017-09-06 MED ORDER — HYDROCODONE-ACETAMINOPHEN 10-325 MG PO TABS: 1.0000 | ORAL_TABLET | ORAL | 0 refills | Status: DC | PRN

## 2017-09-06 NOTE — NC FL2 (Signed)
[place] LEVEL OF CARE SCREENING TOOL     IDENTIFICATION  Patient Name: Nicholas [name] Birthdate: 01/18/1951 Sex: male Admission Date (Current Location): 09/03/2017  [facility] and [place] Number:  Herbalist and Address:  [facility] [place], [place] [address], [place], [place] [address]      Provider Number: [id number]  Attending Physician Name and Address:  Meredith Pel, MD  Relative Name and Phone Number:       Current Level of Care: Hospital Recommended Level of Care: [place] Prior Approval Number:    Date Approved/Denied:   PASRR Number: [id number]  Discharge Plan: SNF    Current Diagnoses: Patient Active Problem List   Diagnosis Date Noted  . Shoulder arthritis 09/03/2017  . Bilateral shoulder pain 05/24/2017  . History of CVA in adulthood 03/08/2017  . S/P total knee arthroplasty, left 01/30/2017  . Arthritis of knee 01/15/2017  . Skin rash 12/10/2016  . Medicare annual wellness visit, initial 10/05/2016  . Advanced care planning/counseling discussion 10/05/2016  . Diarrhea 08/21/2016  . Obesity, Class II, BMI 35-39.9, with comorbidity 07/19/2016  . Hypothyroidism   . IDA (iron deficiency anemia) 04/23/2016  . Chronic pain syndrome 04/23/2016  . Inflammatory arthritis 04/23/2016  . Chronic leg pain 04/23/2016  . Lesion of palate 04/23/2016  . Grieving 04/23/2016  . GERD (gastroesophageal reflux disease)   . Duodenitis determined by biopsy 02/13/2016  . Diverticulosis of colon without hemorrhage 01/31/2016  . Migraine 01/11/2012  . History of methicillin resistant staphylococcus aureus (MRSA) 07/04/2010  . DEGENERATIVE DISC DISEASE 07/04/2010  . History of total right knee replacement 07/04/2010  . Osteoarthritis 06/09/2007  . Personal history of colonic adenoma 06/01/2003    Orientation RESPIRATION BLADDER Height & Weight     Self, Time, Situation, Place  Normal Continent Weight: 263 lb  7.2 oz (119.5 kg) Height:  5\' 9"  (175.3 cm)  BEHAVIORAL SYMPTOMS/MOOD NEUROLOGICAL BOWEL NUTRITION STATUS      Continent Diet(regular)  AMBULATORY STATUS COMMUNICATION OF NEEDS Skin   Supervision Verbally Surgical wounds(on shoulder- silver hydrofiber dressing)                       Personal Care Assistance Level of Assistance  Bathing, Dressing Bathing Assistance: Limited assistance   Dressing Assistance: Limited assistance     Functional Limitations Info             SPECIAL CARE FACTORS FREQUENCY  PT (By licensed PT), OT (By licensed OT)     PT Frequency: 5/wk OT Frequency: 5/wk            Contractures      Additional Factors Info  Code Status, Allergies Code Status Info: FULL Allergies Info: Bee Venom, Peanut-containing Drug Products, Celebrex Celecoxib, Percocet Oxycodone-acetaminophen, Latex, Nsaids           Current Medications (09/06/2017):  This is the current hospital active medication list Current Facility-Administered Medications  Medication Dose Route Frequency Provider Last Rate Last Dose  . amitriptyline (ELAVIL) tablet 75 mg  75 mg Oral QHS Meredith Pel, MD   75 mg at 09/05/17 2251  . aspirin EC tablet 325 mg  325 mg Oral Daily Meredith Pel, MD   325 mg at 09/05/17 4854  . gabapentin (NEURONTIN) capsule 300 mg  300 mg Oral QID Meredith Pel, MD   300 mg at 09/05/17 2251  . HYDROcodone-acetaminophen (NORCO) 10-325 MG per tablet 1 tablet  1 tablet Oral Q4H PRN Meredith Pel, MD   1 tablet at 09/05/17 2250  . HYDROmorphone (DILAUDID) injection 0.5 mg  0.5 mg Intravenous Q3H PRN Meredith Pel, MD   0.5 mg at 09/06/17 0240  . levothyroxine (SYNTHROID, LEVOTHROID) tablet 25 mcg  25 mcg Oral QAC breakfast Meredith Pel, MD   25 mcg at 09/06/17 0750  . menthol-cetylpyridinium (CEPACOL) lozenge 3 mg  1 lozenge Oral PRN Meredith Pel, MD       Or  . phenol (CHLORASEPTIC) mouth spray 1 spray  1 spray  Mouth/Throat PRN Meredith Pel, MD      . metoCLOPramide (REGLAN) tablet 5-10 mg  5-10 mg Oral Q8H PRN Meredith Pel, MD       Or  . metoCLOPramide (REGLAN) injection 5-10 mg  5-10 mg Intravenous Q8H PRN Meredith Pel, MD      . ondansetron [facility]) tablet 4 mg  4 mg Oral Q6H PRN Meredith Pel, MD       Or  . ondansetron [facility]) injection 4 mg  4 mg Intravenous Q6H PRN Meredith Pel, MD      . pantoprazole (PROTONIX) EC tablet 40 mg  40 mg Oral QAC breakfast Meredith Pel, MD   40 mg at 09/06/17 0750     Discharge Medications: Please see discharge summary for a list of discharge medications.  Relevant Imaging Results:  Relevant Lab Results:   Additional Information SS#: [id number]  Jorge Ny, LCSW

## 2017-09-06 NOTE — Progress Notes (Signed)
Physical Therapy Treatment Patient Details Name: Nicholas [name] MRN: [id number] DOB: 1951-04-19 Today's Date: 09/06/2017    History of Present Illness 66 y.o. Male s/p L shoulder replacement. PHM including arthritis, CHF, depression, and Bil TKA with L knee fused.    PT Comments    Session focused on progressing OOB activity. Pt demonstrating increased activity tolerance and independence with ambulation, now able to walk further distances without physical assistance. Pt still requires assistance with bed mobility and transfers. Patient will benefit from SNF placement to regain more independence with functional mobility before returning home.    Follow Up Recommendations  SNF     Equipment Recommendations  None recommended by PT    Recommendations for Other Services       Precautions / Restrictions Precautions Precautions: Shoulder Shoulder Interventions: Shoulder sling/immobilizer;At all times;Off for dressing/bathing/exercises Required Braces or Orthoses: Sling Restrictions Weight Bearing Restrictions: Yes LUE Weight Bearing: Non weight bearing    Mobility  Bed Mobility Overal bed mobility: Needs Assistance Bed Mobility: Supine to Sit     Supine to sit: HOB elevated;Min guard     General bed mobility comments: Pt able to rise supine to sit with HOB elevated and using RUE support on bed rail. slow and painful in LUE.  Transfers Overall transfer level: Needs assistance Equipment used: None Transfers: Sit to/from Stand Sit to Stand: Min assist         General transfer comment: Min A to help power up and add stability, once standing pt is supervision with good static/dynamic balance.    Ambulation/Gait Ambulation/Gait assistance: Supervision Ambulation Distance (Feet): 505 Feet Assistive device: None Gait Pattern/deviations: Step-through pattern;Step-to pattern;Decreased step length - right;Decreased step length - left Gait velocity: decreased   General Gait  Details: Pt improving gait with less assistance. short but smyetrical step lengths with flat footed gait.    Stairs            Wheelchair Mobility    Modified Rankin (Stroke Patients Only)       Balance Overall balance assessment: Needs assistance Sitting-balance support: Feet supported;No upper extremity supported Sitting balance-Leahy Scale: Good Sitting balance - Comments: Reliant on RUE to assist in standing from sitting   Standing balance support: During functional activity;No upper extremity supported Standing balance-Leahy Scale: Good Standing balance comment: able to ambulate long distances without path deviation or LOB without UE support.                             Cognition Arousal/Alertness: Awake/alert Behavior During Therapy: WFL for tasks assessed/performed Overall Cognitive Status: Within Functional Limits for tasks assessed                                        Exercises      General Comments General comments (skin integrity, edema, etc.): VSS throughout session.       Pertinent Vitals/Pain Pain Assessment: Faces Faces Pain Scale: Hurts even more Pain Location: L shoulder Pain Descriptors / Indicators: Discomfort;Grimacing;Guarding    Home Living                      Prior Function            PT Goals (current goals can now be found in the care plan section) Acute Rehab PT Goals Patient Stated Goal: to get  well enough to go home PT Goal Formulation: With patient Time For Goal Achievement: 09/11/17 Potential to Achieve Goals: Good Progress towards PT goals: Progressing toward goals    Frequency    Min 3X/week      PT Plan Current plan remains appropriate    Co-evaluation              AM-PAC PT "6 Clicks" Daily Activity  Outcome Measure  Difficulty turning over in bed (including adjusting bedclothes, sheets and blankets)?: Unable Difficulty moving from lying on back to sitting on  the side of the bed? : Unable Difficulty sitting down on and standing up from a chair with arms (e.g., wheelchair, bedside commode, etc,.)?: Unable Help needed moving to and from a bed to chair (including a wheelchair)?: A Little Help needed walking in hospital room?: A Little Help needed climbing 3-5 steps with a railing? : A Little 6 Click Score: 12    End of Session Equipment Utilized During Treatment: Gait belt;Other (comment) Activity Tolerance: Patient tolerated treatment well Patient left: in bed;with call bell/phone within reach(OT entering room) Nurse Communication: Mobility status PT Visit Diagnosis: Difficulty in walking, not elsewhere classified (R26.2);Pain Pain - Right/Left: Left Pain - part of body: Shoulder     Time: 0300-9233 PT Time Calculation (min) (ACUTE ONLY): 20 min  Charges:  $Gait Training: 8-22 mins                    G Codes:       Reinaldo Berber, PT, DPT Acute Rehab Services Pager: [phone]     Reinaldo Berber 09/06/2017, 8:51 AM

## 2017-09-06 NOTE — Progress Notes (Signed)
Patient will discharge to [place] Anticipated discharge date: 11/23 Family notified: pt sister Transportation by sister- coming at 2pm Report #: [phone]  [place] signing off.  Jorge Ny, LCSW Clinical Social Worker [phone]

## 2017-09-06 NOTE — Progress Notes (Signed)
Report given to Mariann Barter, LPN at [facility]. All questions/concerns addressed. Discharge instructions and medications reviewed with patient, verbalized understanding. IV removed, catheter tip intact. No questions/concerns at this time. Awaiting sister to transport patient to SNF. VSS.

## 2017-09-06 NOTE — Progress Notes (Signed)
Occupational Therapy Treatment Patient Details Name: Nicholas [name] MRN: [id number] DOB: 1950-12-04 Today's Date: 09/06/2017    History of present illness 66 y.o. Male s/p L shoulder replacement. PHM including arthritis, CHF, depression, and Bil TKA with L knee fused.   OT comments  Pt completed sink level ADL and completely dressed for pending d/c. Pt requires mod cues for all exercises and tactile input at times for correct body positioning / form. Pt attempting to twist core to compensate for shoulder abduction movement. Pt reports minimal pain and tolerated task well.    Follow Up Recommendations  SNF    Equipment Recommendations  Other (comment)    Recommendations for Other Services      Precautions / Restrictions Precautions Precautions: Shoulder Type of Shoulder Precautions: Passive protocal: FF 0-90, AB 0-60, and ER 0-30 Shoulder Interventions: Shoulder sling/immobilizer;At all times;Off for dressing/bathing/exercises(pendulum okay) Precaution Comments: Reviewed all information on shoulder handout Required Braces or Orthoses: Sling Restrictions Weight Bearing Restrictions: Yes LUE Weight Bearing: Non weight bearing       Mobility Bed Mobility Overal bed mobility: Needs Assistance Bed Mobility: Supine to Sit     Supine to sit: HOB elevated;Min guard     General bed mobility comments: EOB s/p PT on arrival  Transfers Overall transfer level: Needs assistance Equipment used: None Transfers: Sit to/from Stand Sit to Stand: Min assist         General transfer comment: requires (A) to power up from sitting    Balance Overall balance assessment: No apparent balance deficits (not formally assessed) Sitting-balance support: Feet supported;Single extremity supported Sitting balance-Leahy Scale: Fair Sitting balance - Comments: Reliant on RUE to assist in standing from sitting   Standing balance support: During functional activity;No upper extremity  supported Standing balance-Leahy Scale: Good Standing balance comment: able to ambulate long distances without path deviation or LOB without UE support.                            ADL either performed or assessed with clinical judgement   ADL Overall ADL's : Needs assistance/impaired Eating/Feeding: Set up   Grooming: Set up Grooming Details (indicate cue type and reason): requires sitting and (A) to complete sit<>stand         Upper Body Dressing : Moderate assistance Upper Body Dressing Details (indicate cue type and reason): Requires education on dressing L UE first Lower Body Dressing: Maximal assistance Lower Body Dressing Details (indicate cue type and reason): pt unable to complete L side dressing due to body habitus Toilet Transfer: Minimal assistance;BSC Toilet Transfer Details (indicate cue type and reason): requires (A) to elevated from surface         Functional mobility during ADLs: Min guard General ADL Comments: pt requires (A) for all bathing/ dressing task at this time and poor recall of precautions. pt very much guarding L UE and needs tactile input to help with positioning. Pt attempting to guard core and rotation of trunk instead of L UE     Vision       Perception     Praxis      Cognition Arousal/Alertness: Awake/alert Behavior During Therapy: WFL for tasks assessed/performed Overall Cognitive Status: Within Functional Limits for tasks assessed  Exercises Shoulder Exercises Shoulder ABduction: PROM;Left;10 reps;Seated Elbow Flexion: 10 reps;AROM;Seated;Left Wrist Flexion: AAROM;10 reps;Left;Seated Digit Composite Flexion: AROM;Left;10 reps;Seated Neck Flexion: AROM;10 reps;Seated Neck Extension: AROM;10 reps;Seated Neck Lateral Flexion - Right: AROM;10 reps;Seated Neck Lateral Flexion - Left: AROM;10 reps;Seated   Shoulder Instructions Shoulder Instructions Donning/doffing  shirt without moving shoulder: Maximal assistance Method for sponge bathing under operated UE: Moderate assistance Donning/doffing sling/immobilizer: Maximal assistance Correct positioning of sling/immobilizer: Maximal assistance Pendulum exercises (written home exercise program): (not performed at this time- pt guarding L UE) ROM for elbow, wrist and digits of operated UE: Min-guard(requires mod Cues) Sling wearing schedule (on at all times/off for ADL's): Supervision/safety Proper positioning of operated UE when showering: Supervision/safety Dressing change: (educated on dressing )     General Comments VSS throughout session.     Pertinent Vitals/ Pain       Pain Assessment: Faces Faces Pain Scale: Hurts little more Pain Location: L shoulder Pain Descriptors / Indicators: Discomfort;Grimacing;Guarding  Home Living                                          Prior Functioning/Environment              Frequency  Min 2X/week        Progress Toward Goals  OT Goals(current goals can now be found in the care plan section)  Progress towards OT goals: Progressing toward goals  Acute Rehab OT Goals Patient Stated Goal: to get therapy OT Goal Formulation: With patient Time For Goal Achievement: 09/18/17 Potential to Achieve Goals: Good ADL Goals Pt Will Perform Upper Body Bathing: with set-up;with supervision;with adaptive equipment;sitting Pt Will Perform Upper Body Dressing: with set-up;with supervision;sitting Pt Will Perform Lower Body Dressing: with set-up;with supervision;sit to/from stand;with adaptive equipment Pt/caregiver will Perform Home Exercise Program: Left upper extremity;Independently;With written HEP provided  Plan Discharge plan remains appropriate    Co-evaluation                 AM-PAC PT "6 Clicks" Daily Activity     Outcome Measure   Help from another person eating meals?: A Little Help from another person taking care of  personal grooming?: A Little Help from another person toileting, which includes using toliet, bedpan, or urinal?: A Lot Help from another person bathing (including washing, rinsing, drying)?: A Lot Help from another person to put on and taking off regular upper body clothing?: A Lot Help from another person to put on and taking off regular lower body clothing?: A Lot 6 Click Score: 14    End of Session Equipment Utilized During Treatment: Gait belt  OT Visit Diagnosis: Unsteadiness on feet (R26.81);Other abnormalities of gait and mobility (R26.89);Muscle weakness (generalized) (M62.81);Pain Pain - Right/Left: Left Pain - part of body: Shoulder   Activity Tolerance Patient tolerated treatment well   Patient Left in chair;with call bell/phone within reach   Nurse Communication Mobility status;Precautions;Weight bearing status        Time: 9371-6967 OT Time Calculation (min): 33 min  Charges: OT General Charges $OT Visit: 1 Visit OT Treatments $Self Care/Home Management : 8-22 mins $Therapeutic Exercise: 8-22 mins   Jeri Modena   OTR/L Pager: [phone] Office: [phone] .    Parke Poisson B 09/06/2017, 9:25 AM

## 2017-09-06 NOTE — Progress Notes (Signed)
Subjective: Patient stable.  Having to misuse transitioning from bed to chair and standing up due to his left shoulder.  Pain reasonably well-controlled   Objective: Vital signs in last 24 hours: Temp:  [98 F (36.7 C)-99.4 F (37.4 C)] 98 F (36.7 C) (11/23 0525) Pulse Rate:  [104-112] 104 (11/23 0525) Resp:  [17-20] 18 (11/23 0525) BP: (120-132)/(70-76) 132/70 (11/23 0525) SpO2:  [94 %-96 %] 94 % (11/23 0525)  Intake/Output from previous day: 11/22 0701 - 11/23 0700 In: 600 [P.O.:600] Out: -  Intake/Output this shift: No intake/output data recorded.  Exam:  Intact pulses distally  Labs: No results for input(s): HGB in the last 72 hours. No results for input(s): WBC, RBC, HCT, PLT in the last 72 hours. No results for input(s): NA, K, CL, CO2, BUN, CREATININE, GLUCOSE, CALCIUM in the last 72 hours. No results for input(s): LABPT, INR in the last 72 hours.  Assessment/Plan: Plan is discharged to skilled nursing facility today.  Follow-up with me in 10 days   G Alphonzo Severance 09/06/2017, 7:22 AM

## 2017-09-06 NOTE — Discharge Summary (Signed)
Physician Discharge Summary  Patient ID: Nicholas [name] MRN: [id number] DOB/AGE: 66-13-1952 66 y.o.  Admit date: 09/03/2017 Discharge date: 09/06/2017  Admission Diagnoses:  Active Problems:   Shoulder arthritis   Discharge Diagnoses:  Same  Surgeries: Procedure(s): REVERSE SHOULDER ARTHROPLASTY on 09/03/2017   Consultants:   Discharged Condition: Stable  Hospital Course: Nicholas [name] is an 66 y.o. male who was admitted 09/03/2017 with a chief complaint of left shoulder arthritis, and found to have a diagnosis of eft shoulder arthritis.  They were brought to the operating room on 09/03/2017 and underwent the above named procedures.  atient did well with the procedure but will require additional physical therapy to mobilize.  He is discharged to skilled nursing in good condition on postoperative day #3.  He will follow-up with me in 10 days  Antibiotics given:  Anti-infectives (From admission, onward)   Start     Dose/Rate Route Frequency Ordered Stop   09/03/17 1800  ceFAZolin (ANCEF) IVPB 2g/100 mL premix     2 g 200 mL/hr over 30 Minutes Intravenous Every 6 hours 09/03/17 1745 09/04/17 0130   09/03/17 1235  vancomycin (VANCOCIN) powder  Status:  Discontinued       As needed 09/03/17 1235 09/03/17 1334   09/03/17 0700  ceFAZolin (ANCEF) 3 g in dextrose 5 % 50 mL IVPB     3 g 130 mL/hr over 30 Minutes Intravenous To [facility]Stay [facility] 09/02/17 1030 09/03/17 1135    .  Recent vital signs:  Vitals:   09/05/17 1939 09/06/17 0525  BP: 129/76 132/70  Pulse: (!) 108 (!) 104  Resp: 20 18  Temp: 99.4 F (37.4 C) 98 F (36.7 C)  SpO2: 95% 94%    Recent laboratory studies:  Results for orders placed or performed during the hospital encounter of 08/21/17  Surgical pcr screen  Result Value Ref Range   MRSA, PCR NEGATIVE NEGATIVE   Staphylococcus aureus NEGATIVE NEGATIVE  Urine culture  Result Value Ref Range   Specimen Description URINE, CLEAN CATCH    Special Requests  Normal    Culture NO GROWTH    Report Status 08/23/2017 FINAL   CBC  Result Value Ref Range   WBC 11.7 (H) 4.0 - 10.5 K/uL   RBC 5.10 4.22 - 5.81 MIL/uL   Hemoglobin 15.5 13.0 - 17.0 g/dL   HCT 46.0 39.0 - 52.0 %   MCV 90.2 78.0 - 100.0 fL   MCH 30.4 26.0 - 34.0 pg   MCHC 33.7 30.0 - 36.0 g/dL   RDW 14.8 11.5 - 15.5 %   Platelets 220 150 - 400 K/uL  Comprehensive metabolic panel  Result Value Ref Range   Sodium 138 135 - 145 mmol/L   Potassium 4.2 3.5 - 5.1 mmol/L   Chloride 105 101 - 111 mmol/L   CO2 26 22 - 32 mmol/L   Glucose, Bld 121 (H) 65 - 99 mg/dL   BUN 12 6 - 20 mg/dL   Creatinine, Ser 1.47 (H) 0.61 - 1.24 mg/dL   Calcium 9.2 8.9 - 10.3 mg/dL   Total Protein 7.9 6.5 - 8.1 g/dL   Albumin 4.1 3.5 - 5.0 g/dL   AST 35 15 - 41 U/L   ALT 31 17 - 63 U/L   Alkaline Phosphatase 86 38 - 126 U/L   Total Bilirubin 1.4 (H) 0.3 - 1.2 mg/dL   GFR calc non Af Amer 48 (L) >60 mL/min   GFR calc Af Amer 56 (L) >60 mL/min  Anion gap 7 5 - 15  Urinalysis, Routine w reflex microscopic  Result Value Ref Range   Color, Urine AMBER (A) YELLOW   APPearance HAZY (A) CLEAR   Specific Gravity, Urine 1.027 1.005 - 1.030   pH 5.0 5.0 - 8.0   Glucose, UA NEGATIVE NEGATIVE mg/dL   Hgb urine dipstick NEGATIVE NEGATIVE   Bilirubin Urine NEGATIVE NEGATIVE   Ketones, ur NEGATIVE NEGATIVE mg/dL   Protein, ur 30 (A) NEGATIVE mg/dL   Nitrite NEGATIVE NEGATIVE   Leukocytes, UA NEGATIVE NEGATIVE   RBC / HPF 6-30 0 - 5 RBC/hpf   WBC, UA 0-5 0 - 5 WBC/hpf   Bacteria, UA NONE SEEN NONE SEEN   Squamous Epithelial / LPF 0-5 (A) NONE SEEN   Mucus PRESENT    Hyaline Casts, UA PRESENT    Ca Oxalate Crys, UA PRESENT     Discharge Medications:   Allergies as of 09/06/2017      Reactions   Bee Venom Anaphylaxis, Other (See Comments)   Respiratory Distress   Peanut-containing Drug Products Anaphylaxis, Dermatitis   Celebrex [celecoxib] Other (See Comments)   BLACK STOOL?MELENA?   Percocet  [oxycodone-acetaminophen] Hives, Swelling   SWELLING REACTION UNSPECIFIED    Latex Rash   Nsaids Other (See Comments)   Disagrees with system      Medication List    TAKE these medications   acetaminophen 500 MG tablet Commonly known as:  TYLENOL Take 1,000 mg every 6 (six) hours as needed by mouth for moderate pain or headache.   amitriptyline 25 MG tablet Commonly known as:  ELAVIL Take 75 mg at bedtime by mouth.   CENTRUM SILVER PO Take 1 tablet daily by mouth.   EPIPEN 2-PAK 0.3 mg/0.3 mL Soaj injection Generic drug:  EPINEPHrine Inject 0.3 mg once into the muscle.   gabapentin 300 MG capsule Commonly known as:  NEURONTIN Take 300 mg 4 (four) times daily by mouth.   HYDROcodone-acetaminophen 10-325 MG tablet Commonly known as:  NORCO Take 1 tablet by mouth every 4 (four) hours as needed for moderate pain ((score 4 to 6)). What changed:    how much to take  when to take this  reasons to take this  additional instructions   hydrocortisone 2.5 % cream Apply 1 application topically at bedtime as needed (for rash).   levothyroxine 25 MCG tablet Commonly known as:  SYNTHROID, LEVOTHROID Take 1 tablet (25 mcg total) by mouth daily before breakfast. 2 days a week, take 2 tablets (62mcg) What changed:    how much to take  when to take this  additional instructions   pantoprazole 40 MG tablet Commonly known as:  PROTONIX TAKE 1 TABLET (40 MG TOTAL) BY MOUTH DAILY BEFORE BREAKFAST.   PROBIOTIC PO Take 1 capsule daily by mouth.   SUMAtriptan 50 MG tablet Commonly known as:  IMITREX TAKE 1 TABLET (50 MG TOTAL) BY MOUTH ONCE. MAY REPEAT IN 2 HOURS IF HEADACHE PERSISTS OR RECURS.       Diagnostic Studies: Ct Shoulder Left Wo Contrast  Result Date: 08/07/2017 CLINICAL DATA:  Preoperative examination. Patient for left shoulder surgery 06/27/2017. EXAM: CT OF THE UPPER LEFT EXTREMITY WITHOUT CONTRAST TECHNIQUE: Multidetector CT imaging of the upper left  extremity was performed according to the standard protocol. COMPARISON:  CT left shoulder 06/27/2017 FINDINGS: Bones/Joint/Cartilage Again seen is severe glenohumeral osteoarthritis with bone-on-bone joint space narrowing, remodeling and flattening of the glenoid and a large osteophyte off the humeral head. Loose body in  the superior aspect of the joint measuring 0.8 cm in diameter is identified. Several tiny subchondral cysts are identified in the glenoid. No acute bony or joint abnormality is identified. The patient has moderate to moderately severe acromioclavicular osteoarthritis. Os acromiale is noted. Ligaments Suboptimally assessed by CT. Muscles and Tendons The rotator cuff appears intact. Imaged lung parenchyma is clear. Aortic atherosclerosis is noted. Soft tissues Imaged lung parenchyma is clear. Heart size is upper normal. Aortic atherosclerosis is noted. IMPRESSION: Severe glenohumeral osteoarthritis. Moderate acromioclavicular osteoarthritis. Os acromiale. Intact appearing rotator cuff. Electronically Signed   By: Inge Rise M.D.   On: 08/07/2017 13:39   Dg Shoulder Left Port  Result Date: 09/03/2017 CLINICAL DATA:  Postop day 0 left shoulder arthroplasty due to severe osteoarthritis. EXAM: LEFT SHOULDER - 2 VIEW COMPARISON:  Left shoulder CT 08/07/2017, 06/27/2017, 07/27/2016. Left shoulder x-rays 07/11/2016. FINDINGS: Anatomic alignment post left shoulder arthroplasty. No acute complicating features. Acromioclavicular joint intact with mild degenerative changes. IMPRESSION: Anatomic alignment post left shoulder arthroplasty without acute complicating features. Electronically Signed   By: Evangeline Dakin M.D.   On: 09/03/2017 14:20    Disposition: 03-Skilled Nursing Facility  Discharge Instructions    Call MD / Call 911   Complete by:  As directed    If you experience chest pain or shortness of breath, CALL 911 and be transported to the hospital emergency room.  If you develope a  fever above 101 F, pus (white drainage) or increased drainage or redness at the wound, or calf pain, call your surgeon's office.   Call MD / Call 911   Complete by:  As directed    If you experience chest pain or shortness of breath, CALL 911 and be transported to the hospital emergency room.  If you develope a fever above 101 F, pus (white drainage) or increased drainage or redness at the wound, or calf pain, call your surgeon's office.   Constipation Prevention   Complete by:  As directed    Drink plenty of fluids.  Prune juice may be helpful.  You may use a stool softener, such as Colace (over the counter) 100 mg twice a day.  Use MiraLax (over the counter) for constipation as needed.   Constipation Prevention   Complete by:  As directed    Drink plenty of fluids.  Prune juice may be helpful.  You may use a stool softener, such as Colace (over the counter) 100 mg twice a day.  Use MiraLax (over the counter) for constipation as needed.   Diet - low sodium heart healthy   Complete by:  As directed    Diet - low sodium heart healthy   Complete by:  As directed    Discharge instructions   Complete by:  As directed    Okay for passive shoulder range of motion on the left Removed this dressing on the left shoulder in 7 days on 09/13/2017   Increase activity slowly as tolerated   Complete by:  As directed    Increase activity slowly as tolerated   Complete by:  As directed          Signed: Anderson Malta 09/06/2017, 7:30 AM

## 2017-09-09 ENCOUNTER — Inpatient Hospital Stay (INDEPENDENT_AMBULATORY_CARE_PROVIDER_SITE_OTHER): Payer: PPO | Admitting: Orthopedic Surgery

## 2017-09-09 ENCOUNTER — Telehealth (INDEPENDENT_AMBULATORY_CARE_PROVIDER_SITE_OTHER): Payer: Self-pay | Admitting: Radiology

## 2017-09-09 NOTE — Telephone Encounter (Signed)
We can change him to oxycodone 10 mg every 4 hours as needed for pain if he is not allergic.  I can call him on Wednesday

## 2017-09-09 NOTE — Telephone Encounter (Signed)
Please advise. Thanks.  

## 2017-09-09 NOTE — Telephone Encounter (Signed)
Patient called, and said that he is hurting a lot.  He is getting norco from SNF for pain.  His arm from elbow down just aches so bad.  The hand aches, he is trying to massage it some, I advised open/close hand and wiggle fingers.  Told him the swelling can cause increase pain, he is still in sling.  I told him to see if they could help him get some ice/cold on it.  He wants a call to discuss, see if something besides norco will help?

## 2017-09-10 DIAGNOSIS — M6281 Muscle weakness (generalized): Secondary | ICD-10-CM | POA: Diagnosis not present

## 2017-09-10 DIAGNOSIS — M79602 Pain in left arm: Secondary | ICD-10-CM | POA: Diagnosis not present

## 2017-09-10 DIAGNOSIS — R5381 Other malaise: Secondary | ICD-10-CM | POA: Diagnosis not present

## 2017-09-10 DIAGNOSIS — R262 Difficulty in walking, not elsewhere classified: Secondary | ICD-10-CM | POA: Diagnosis not present

## 2017-09-10 NOTE — Telephone Encounter (Signed)
IC s/w patient he does have allergy to oxycodone. His first PO appt was not until 12/5 so I moved him to Friday.

## 2017-09-10 NOTE — Telephone Encounter (Signed)
Ok thx.

## 2017-09-13 ENCOUNTER — Encounter (INDEPENDENT_AMBULATORY_CARE_PROVIDER_SITE_OTHER): Payer: Self-pay | Admitting: Orthopedic Surgery

## 2017-09-13 ENCOUNTER — Ambulatory Visit (INDEPENDENT_AMBULATORY_CARE_PROVIDER_SITE_OTHER): Payer: PPO

## 2017-09-13 ENCOUNTER — Ambulatory Visit (INDEPENDENT_AMBULATORY_CARE_PROVIDER_SITE_OTHER): Payer: PPO | Admitting: Orthopedic Surgery

## 2017-09-13 DIAGNOSIS — Z9889 Other specified postprocedural states: Secondary | ICD-10-CM | POA: Diagnosis not present

## 2017-09-14 ENCOUNTER — Encounter (INDEPENDENT_AMBULATORY_CARE_PROVIDER_SITE_OTHER): Payer: Self-pay | Admitting: Orthopedic Surgery

## 2017-09-14 NOTE — Progress Notes (Signed)
Post-Op Visit Note   Patient: Nicholas [name]           Date of Birth: Jul 12, 1951           MRN: [id number] Visit Date: 09/13/2017 PCP: [name], MD   Assessment & Plan:  Chief Complaint:  Chief Complaint  Patient presents with  . Left Shoulder - Follow-up   Visit Diagnoses:  1. S/P shoulder surgery     Plan: Nicholas [name] is a patient who is now 10 days out left total shoulder replacement.  He is having a lot of pain in his left wrist.  He has known arthritis in this area.  Taking 3 Norco a day for the pain and he is in a rehab facility.  Gets physical therapy daily.  On exam his shoulder range of motion is actually pretty reasonable and nontender.  Wrist has some swelling and pain with flexion and extension.  EPL FPL interosseous strength is intact.  Deltoid fires.  Plan at this time is to try topical anti-inflammatory for the wrist.  Cannot really take oral anti-inflammatories.  I will see him back in 2 weeks.  He needs to continue to work on shoulder range of motion.  Talked about a wrist splint but he wants to hold off on that for now.  In general he has fairly good pain-free range of motion of the left shoulder but the wrist is the main issue at this time which has some pre-existing arthritis which has likely been exacerbated to the surgery  Follow-Up Instructions: Return in about 2 weeks (around 09/27/2017).   Orders:  Orders Placed This Encounter  Procedures  . XR Shoulder Left   No orders of the defined types were placed in this encounter.   Imaging: No results found.  PMFS History: Patient Active Problem List   Diagnosis Date Noted  . Shoulder arthritis 09/03/2017  . Bilateral shoulder pain 05/24/2017  . History of CVA in adulthood 03/08/2017  . S/P total knee arthroplasty, left 01/30/2017  . Arthritis of knee 01/15/2017  . Skin rash 12/10/2016  . Medicare annual wellness visit, initial 10/05/2016  . Advanced care planning/counseling discussion 10/05/2016    . Diarrhea 08/21/2016  . Obesity, Class II, BMI 35-39.9, with comorbidity 07/19/2016  . Hypothyroidism   . IDA (iron deficiency anemia) 04/23/2016  . Chronic pain syndrome 04/23/2016  . Inflammatory arthritis 04/23/2016  . Chronic leg pain 04/23/2016  . Lesion of palate 04/23/2016  . Grieving 04/23/2016  . GERD (gastroesophageal reflux disease)   . Duodenitis determined by biopsy 02/13/2016  . Diverticulosis of colon without hemorrhage 01/31/2016  . Migraine 01/11/2012  . History of methicillin resistant staphylococcus aureus (MRSA) 07/04/2010  . DEGENERATIVE DISC DISEASE 07/04/2010  . History of total right knee replacement 07/04/2010  . Osteoarthritis 06/09/2007  . Personal history of colonic adenoma 06/01/2003   Past Medical History:  Diagnosis Date  . Acute kidney failure 08/2008   "cleared up"  no problems since  . Allergy   . Anxiety   . Arthritis   . Asthma    ?of this no inhaler  . Blood transfusion   . CHF (congestive heart failure) ([place])    ?of this, pt denies  . CLOSTRIDIUM DIFFICILE COLITIS 07/04/2010   Annotation: 12/09, 2/10 Qualifier: Diagnosis of  By: [name], [name]    . Depression   . Depression with anxiety   . Duodenitis determined by biopsy 02/2016   peptic likely due to aleve (erosive gastropathy with duodenal  erosions)  . ECZEMA 07/04/2010   Qualifier: Diagnosis of  By: [name], [name]    . Elevated liver enzymes   . GERD (gastroesophageal reflux disease)   . Heart attack ([place])    08/2008 (likely demand ischemia in the setting of MSSA sepsis/R TKA  infection)10-2008  . History of hiatal hernia   . History of stomach ulcers   . Hypothyroidism   . Lower GI bleeding   . MALAR AND MAXILLARY BONES CLOSED FRACTURE 07/04/2010   Annotation: ORIF Qualifier: Diagnosis of  By: [name], [name]    . Migraine    "definitely"  . MRSA (methicillin resistant Staphylococcus aureus)    in leg, had to place steel rod in leg  . Personal history of colonic  adenoma 06/01/2003  . PFO (patent foramen ovale)    small PFO by 08/2008 TEE  . Pneumonia 08/2008   "while in ICU"  . Seizures ([facility]) 2009   "long time ago"    . Stroke [facility]) 2010   unable to complete sentences at times    Family History  Problem Relation Age of Onset  . Coronary artery disease Father   . Heart disease Mother   . Breast cancer Sister   . Diabetes Brother   . Diabetes Sister   . Colon cancer Neg Hx   . Stomach cancer Neg Hx     Past Surgical History:  Procedure Laterality Date  . anterior  nerve transposition  07/2009   left ulnar nerve  . antibiotic spacer exchange  11/2008; 08/2006   right knee  . ARTHROTOMY  08/2008   right knee w/I&D  . CATARACT EXTRACTION    . COLONOSCOPY  11/2012   diverticulosis, rpt 10 yrs [name])  . ESOPHAGOGASTRODUODENOSCOPY  02/2016   erosive gastropathy with duodenal erosions [name])  . HARDWARE REMOVAL  04/2006   right knee w/antibiotic spacers placed  . INGUINAL HERNIA REPAIR  early 1990's   bilateral  . JOINT REPLACEMENT Right    rt knee  . KNEE ARTHROSCOPY  10/2001   right  . KNEE FUSION  03/2009   right knee removal; antibiotic spacers;   . REPLACEMENT TOTAL KNEE  08/2008; 09/2001   right  . REVERSE SHOULDER ARTHROPLASTY Left 09/03/2017   Procedure: REVERSE SHOULDER ARTHROPLASTY;  Surgeon: Meredith Pel, MD;  Location: [facility];  Service: Orthopedics;  Laterality: Left;  . rod placement Right   . SYNOVECTOMY  06/2005   debridement, liner exchange right knee  . TOE AMPUTATION Left 08/2008   great toe  . TOE AMPUTATION Left 04/2016   2nd toe [name]  . TONSILLECTOMY    . TOTAL KNEE ARTHROPLASTY Left 01/15/2017  . TOTAL KNEE ARTHROPLASTY Left 01/15/2017   Procedure: TOTAL KNEE ARTHROPLASTY;  Surgeon: Meredith Pel, MD;  Location: [facility];  Service: Orthopedics;  Laterality: Left;   Social History   Occupational History  . Occupation: retired  Tobacco Use  . Smoking status: Never Smoker  . Smokeless tobacco:  Never Used  Substance and Sexual Activity  . Alcohol use: No    Alcohol/week: 0.0 oz    Comment: "I abused alcohol; last drink  ~ 2005"  . Drug use: No  . Sexual activity: No

## 2017-09-16 ENCOUNTER — Telehealth (INDEPENDENT_AMBULATORY_CARE_PROVIDER_SITE_OTHER): Payer: Self-pay | Admitting: Orthopedic Surgery

## 2017-09-16 NOTE — Telephone Encounter (Signed)
Pt has two appt sched one say Hospital F/u and the other appt say 2 week f/u. Pt would like to know If he needs to come to the appt on the 5th. Pt app desk states a post op visit. I wasn't sure what to tell pt

## 2017-09-16 NOTE — Telephone Encounter (Signed)
2 w from last week thx

## 2017-09-16 NOTE — Telephone Encounter (Signed)
I advised patient per Dr [name] Sa

## 2017-09-16 NOTE — Telephone Encounter (Signed)
Patient was seen last week. Did you want him to come back in another week or 2 weeks from last week.

## 2017-09-18 ENCOUNTER — Inpatient Hospital Stay (INDEPENDENT_AMBULATORY_CARE_PROVIDER_SITE_OTHER): Payer: PPO | Admitting: Orthopedic Surgery

## 2017-09-19 ENCOUNTER — Telehealth: Payer: Self-pay

## 2017-09-19 DIAGNOSIS — M25511 Pain in right shoulder: Principal | ICD-10-CM

## 2017-09-19 DIAGNOSIS — M25512 Pain in left shoulder: Principal | ICD-10-CM

## 2017-09-19 DIAGNOSIS — Z96652 Presence of left artificial knee joint: Secondary | ICD-10-CM

## 2017-09-19 DIAGNOSIS — G8929 Other chronic pain: Secondary | ICD-10-CM

## 2017-09-19 DIAGNOSIS — M19019 Primary osteoarthritis, unspecified shoulder: Secondary | ICD-10-CM

## 2017-09-19 NOTE — Telephone Encounter (Signed)
Copied from [place]. Topic: Referral - Request >> Sep 19, 2017  3:02 PM Arletha Grippe wrote: Reason for CRM: Gomez Cleverly with [facility] called - she is needing to have pt referred to [facility] outpatient rehab ( [address]) [phone] He will being discharged Saturday from rehab with shoulder surgery.  [facility] needs to have referral from primary care.  They would not accept referral from [place] [facility] Cb number is [phone] for [facility]

## 2017-09-20 ENCOUNTER — Other Ambulatory Visit: Payer: Self-pay | Admitting: Family Medicine

## 2017-09-20 NOTE — Telephone Encounter (Signed)
I have placed referral. This could also come from orthopedist as I don't have latest records. Thanks.

## 2017-09-20 NOTE — Telephone Encounter (Signed)
Placed our referral on [facility] rehab workque. Faxed cover sheet to [facility] for them to have the patient call [facility]  them directly to get scheduled for his therapy. Patient has a followup appt set up with Dr [name] Sa on 09/27/17 if anything needs to come from the surgeon.

## 2017-09-20 NOTE — Telephone Encounter (Signed)
Last filled:  06/25/17, #360 Last OV:  05/24/17 Next OV:  10/01/17

## 2017-09-24 ENCOUNTER — Telehealth (INDEPENDENT_AMBULATORY_CARE_PROVIDER_SITE_OTHER): Payer: Self-pay

## 2017-09-24 NOTE — Telephone Encounter (Signed)
IC s/w patient and advised. He verbalized understanding. He stated he would like to try aspercream and will get some to try.

## 2017-09-24 NOTE — Telephone Encounter (Signed)
Patient has an appt to come in on Friday for follow up from surgery but he wanted you to know that he was d/c from nursing facility on Saturday and when they sent him home would not provide him with rx for pennsaid that you had given him samples of because they stated that there was an ingredient in it that would cause a rash on him. He stated he used both boxes you gave without an problem but he is afraid to keep using it b/c of what he was told by facility. He wanted to know if you can recommend anything else for him. It did help the swelling in his wrist/hand but now that he has been without it, the swelling has returned and it is very painful. Please advise. Thanks Contact for patient is [phone].

## 2017-09-24 NOTE — Telephone Encounter (Signed)
Go with pensaid or aspercream pls call thx

## 2017-09-27 ENCOUNTER — Ambulatory Visit (INDEPENDENT_AMBULATORY_CARE_PROVIDER_SITE_OTHER): Payer: PPO | Admitting: Orthopedic Surgery

## 2017-09-27 ENCOUNTER — Ambulatory Visit (INDEPENDENT_AMBULATORY_CARE_PROVIDER_SITE_OTHER): Payer: PPO

## 2017-09-27 ENCOUNTER — Encounter (INDEPENDENT_AMBULATORY_CARE_PROVIDER_SITE_OTHER): Payer: Self-pay | Admitting: Orthopedic Surgery

## 2017-09-27 DIAGNOSIS — M79642 Pain in left hand: Secondary | ICD-10-CM

## 2017-09-27 DIAGNOSIS — Z96612 Presence of left artificial shoulder joint: Secondary | ICD-10-CM

## 2017-09-28 NOTE — Progress Notes (Signed)
Office Visit Note   Patient: Nicholas [name]           Date of Birth: 01/02/1951           MRN: [id number] Visit Date: 09/27/2017 Requested by: [name], MD [place], [place] [id number] PCP: [name], MD  Subjective: Chief Complaint  Patient presents with  . Left Hand - Pain  . Left Shoulder - Routine Post Op    HPI: Nicholas [name] is a patient who is now about 3 weeks out left reverse shoulder replacement.  He is doing well with that shoulder.  States that he is having more pain now in his hand and wrist.  He does report having a fall in the house.  Denies severe area of pain at this time is the left wrist.  He has had a prior right wrist MRI scan which showed fairly severe erosive arthritis.  His left wrist is more symptomatic at this time.              ROS: All systems reviewed are negative as they relate to the chief complaint within the history of present illness.  Patient denies  fevers or chills.   Assessment & Plan: Visit Diagnoses:  1. Pain in left hand   2. Status post reverse total replacement of left shoulder     Plan: Impression is well functioning shoulder replacement with good pain relief on this front.  More symptomatic for Nicholas [name] now is this left hand and wrist.  He has severe erosive arthritis present as well as scapholunate widening and no discrete fracture.  Wrist range of motion generally maintained but I do not think this is something that we can help with an injection.  I think he may require some type of limited intracarpal arthrodesis in order to achieve pain relief.  I am going to refer him to Dr. Hurshel Keys orthopedics for consideration of that procedure.  In the meantime I will give him a wrist splint.  Also want to refer him to [facility] physical therapy for shoulder range of motion and strengthening exercises.  Follow-up with me in 4 weeks.  Follow-Up Instructions: Return in about 4 weeks (around 10/25/2017).   Orders:    Orders Placed This Encounter  Procedures  . XR Hand Complete Left  . Ambulatory referral to Physical Therapy  . Ambulatory referral to Orthopedic Surgery   No orders of the defined types were placed in this encounter.     Procedures: No procedures performed   Clinical Data: No additional findings.  Objective: Vital Signs: There were no vitals taken for this visit.  Physical Exam:   Constitutional: Patient appears well-developed HEENT:  Head: Normocephalic Eyes:EOM are normal Neck: Normal range of motion Cardiovascular: Normal rate Pulmonary/chest: Effort normal Neurologic: Patient is alert Skin: Skin is warm Psychiatric: Patient has normal mood and affect    Ortho Exam: Orthopedic exam demonstrates pretty reasonable passive range of motion of the left shoulder.  Still is a little bit stiff in external rotation.  Deltoid fires.  Left wrist has synovitis and swelling.  Grip strength is intact but wrist range of motion is about half of the right hand side.  Finger range of motion of the MCP PIP and DIP joints generally maintained.  Radial pulse is intact.  Specialty Comments:  No specialty comments available.  Imaging: No results found.   PMFS History: Patient Active Problem List   Diagnosis Date Noted  . Shoulder arthritis 09/03/2017  .  Bilateral shoulder pain 05/24/2017  . History of CVA in adulthood 03/08/2017  . S/P total knee arthroplasty, left 01/30/2017  . Arthritis of knee 01/15/2017  . Skin rash 12/10/2016  . Medicare annual wellness visit, initial 10/05/2016  . Advanced care planning/counseling discussion 10/05/2016  . Diarrhea 08/21/2016  . Obesity, Class II, BMI 35-39.9, with comorbidity 07/19/2016  . Hypothyroidism   . IDA (iron deficiency anemia) 04/23/2016  . Chronic pain syndrome 04/23/2016  . Inflammatory arthritis 04/23/2016  . Chronic leg pain 04/23/2016  . Lesion of palate 04/23/2016  . Grieving 04/23/2016  . GERD (gastroesophageal  reflux disease)   . Duodenitis determined by biopsy 02/13/2016  . Diverticulosis of colon without hemorrhage 01/31/2016  . Migraine 01/11/2012  . History of methicillin resistant staphylococcus aureus (MRSA) 07/04/2010  . DEGENERATIVE DISC DISEASE 07/04/2010  . History of total right knee replacement 07/04/2010  . Osteoarthritis 06/09/2007  . Personal history of colonic adenoma 06/01/2003   Past Medical History:  Diagnosis Date  . Acute kidney failure 08/2008   "cleared up"  no problems since  . Allergy   . Anxiety   . Arthritis   . Asthma    ?of this no inhaler  . Blood transfusion   . CHF (congestive heart failure) ([place])    ?of this, pt denies  . CLOSTRIDIUM DIFFICILE COLITIS 07/04/2010   Annotation: 12/09, 2/10 Qualifier: Diagnosis of  By: [name], [name]    . Depression   . Depression with anxiety   . Duodenitis determined by biopsy 02/2016   peptic likely due to aleve (erosive gastropathy with duodenal erosions)  . ECZEMA 07/04/2010   Qualifier: Diagnosis of  By: [name], [name]    . Elevated liver enzymes   . GERD (gastroesophageal reflux disease)   . Heart attack ([place])    08/2008 (likely demand ischemia in the setting of MSSA sepsis/R TKA  infection)10-2008  . History of hiatal hernia   . History of stomach ulcers   . Hypothyroidism   . Lower GI bleeding   . MALAR AND MAXILLARY BONES CLOSED FRACTURE 07/04/2010   Annotation: ORIF Qualifier: Diagnosis of  By: [name], [name]    . Migraine    "definitely"  . MRSA (methicillin resistant Staphylococcus aureus)    in leg, had to place steel rod in leg  . Personal history of colonic adenoma 06/01/2003  . PFO (patent foramen ovale)    small PFO by 08/2008 TEE  . Pneumonia 08/2008   "while in ICU"  . Seizures ([place]) 2009   "long time ago"    . Stroke [facility]) 2010   unable to complete sentences at times    Family History  Problem Relation Age of Onset  . Coronary artery disease Father   . Heart disease Mother   .  Breast cancer Sister   . Diabetes Brother   . Diabetes Sister   . Colon cancer Neg Hx   . Stomach cancer Neg Hx     Past Surgical History:  Procedure Laterality Date  . anterior  nerve transposition  07/2009   left ulnar nerve  . antibiotic spacer exchange  11/2008; 08/2006   right knee  . ARTHROTOMY  08/2008   right knee w/I&D  . CATARACT EXTRACTION    . COLONOSCOPY  11/2012   diverticulosis, rpt 10 yrs [name])  . ESOPHAGOGASTRODUODENOSCOPY  02/2016   erosive gastropathy with duodenal erosions [name])  . HARDWARE REMOVAL  04/2006   right knee w/antibiotic spacers placed  .  INGUINAL HERNIA REPAIR  early 1990's   bilateral  . JOINT REPLACEMENT Right    rt knee  . KNEE ARTHROSCOPY  10/2001   right  . KNEE FUSION  03/2009   right knee removal; antibiotic spacers;   . REPLACEMENT TOTAL KNEE  08/2008; 09/2001   right  . REVERSE SHOULDER ARTHROPLASTY Left 09/03/2017   Procedure: REVERSE SHOULDER ARTHROPLASTY;  Surgeon: Meredith Pel, MD;  Location: [place];  Service: Orthopedics;  Laterality: Left;  . rod placement Right   . SYNOVECTOMY  06/2005   debridement, liner exchange right knee  . TOE AMPUTATION Left 08/2008   great toe  . TOE AMPUTATION Left 04/2016   2nd toe [name]  . TONSILLECTOMY    . TOTAL KNEE ARTHROPLASTY Left 01/15/2017  . TOTAL KNEE ARTHROPLASTY Left 01/15/2017   Procedure: TOTAL KNEE ARTHROPLASTY;  Surgeon: Meredith Pel, MD;  Location: [place];  Service: Orthopedics;  Laterality: Left;   Social History   Occupational History  . Occupation: retired  Tobacco Use  . Smoking status: Never Smoker  . Smokeless tobacco: Never Used  Substance and Sexual Activity  . Alcohol use: No    Alcohol/week: 0.0 oz    Comment: "I abused alcohol; last drink  ~ 2005"  . Drug use: No  . Sexual activity: No

## 2017-10-01 ENCOUNTER — Encounter: Payer: Self-pay | Admitting: Family Medicine

## 2017-10-01 ENCOUNTER — Ambulatory Visit: Payer: PPO | Admitting: Family Medicine

## 2017-10-01 VITALS — BP 138/86 | HR 73 | Temp 98.2°F | Wt 263.0 lb

## 2017-10-01 DIAGNOSIS — Z96612 Presence of left artificial shoulder joint: Secondary | ICD-10-CM | POA: Diagnosis not present

## 2017-10-01 DIAGNOSIS — G43109 Migraine with aura, not intractable, without status migrainosus: Secondary | ICD-10-CM

## 2017-10-01 DIAGNOSIS — G894 Chronic pain syndrome: Secondary | ICD-10-CM

## 2017-10-01 DIAGNOSIS — N289 Disorder of kidney and ureter, unspecified: Secondary | ICD-10-CM

## 2017-10-01 DIAGNOSIS — M159 Polyosteoarthritis, unspecified: Secondary | ICD-10-CM

## 2017-10-01 DIAGNOSIS — M150 Primary generalized (osteo)arthritis: Secondary | ICD-10-CM

## 2017-10-01 DIAGNOSIS — K760 Fatty (change of) liver, not elsewhere classified: Secondary | ICD-10-CM | POA: Diagnosis not present

## 2017-10-01 DIAGNOSIS — N183 Chronic kidney disease, stage 3 unspecified: Secondary | ICD-10-CM | POA: Insufficient documentation

## 2017-10-01 DIAGNOSIS — E039 Hypothyroidism, unspecified: Secondary | ICD-10-CM | POA: Diagnosis not present

## 2017-10-01 LAB — COMPREHENSIVE METABOLIC PANEL
ALT: 16 U/L (ref 0–53)
AST: 21 U/L (ref 0–37)
Albumin: 3.6 g/dL (ref 3.5–5.2)
Alkaline Phosphatase: 93 U/L (ref 39–117)
BILIRUBIN TOTAL: 0.9 mg/dL (ref 0.2–1.2)
BUN: 16 mg/dL (ref 6–23)
CALCIUM: 8.6 mg/dL (ref 8.4–10.5)
CO2: 28 meq/L (ref 19–32)
Chloride: 104 mEq/L (ref 96–112)
Creatinine, Ser: 1.18 mg/dL (ref 0.40–1.50)
GFR: 65.47 mL/min (ref >60.00)
Glucose, Bld: 90 mg/dL (ref 70–99)
Potassium: 4.3 mEq/L (ref 3.5–5.1)
Sodium: 139 mEq/L (ref 135–145)
Total Protein: 7 g/dL (ref 6.0–8.3)

## 2017-10-01 LAB — TSH: TSH: 11.37 u[IU]/mL — AB (ref 0.35–4.50)

## 2017-10-01 LAB — T4, FREE: FREE T4: 0.65 ng/dL (ref 0.60–1.60)

## 2017-10-01 NOTE — Assessment & Plan Note (Signed)
Cr recently bumped from 1.2 --> 1.4 during recent surgery. Will recheck levels today.

## 2017-10-01 NOTE — Assessment & Plan Note (Signed)
Chronic, severe. Planned eval by hand surgeon for severe erosive wrist osteoarthritis.

## 2017-10-01 NOTE — Progress Notes (Signed)
BP 138/86 (BP Location: Left Arm, Patient Position: Sitting, Cuff Size: Normal)   Pulse 73   Temp 98.2 F (36.8 C) (Oral)   Wt 263 lb (119.3 kg)   SpO2 93%   BMI 38.84 kg/m    CC: SNF f/u visit Subjective:    Patient ID: Nicholas [name], male    DOB: 1950-11-16, 66 y.o.   MRN: [id number]  HPI: Nicholas [name] is a 66 y.o. male presenting on 10/01/2017 for Dicharged from facility (Recently d/c from [facility]. Was there due surgery on left shoulder)   Recently discharged from [facility] after L shoulder replacement (Dr [name]. Came home 09/21/2017. Planned eval by Dr Grandville Silos for severe erosive arthritis and scapholunate widening.   Currently taking hydrocodone 10/325mg  - mainly for L wrist pain. Takes 1.5 tablets a day. Also takes gabapentin 300mg  QID, amitriptyline 75mg  at bedtime.   Due for kidney and thyroid recheck.  Taking levothyroxine 45mcg daily.   Relevant past medical, surgical, family and social history reviewed and updated as indicated. Interim medical history since our last visit reviewed. Allergies and medications reviewed and updated. Outpatient Medications Prior to Visit  Medication Sig Dispense Refill  . acetaminophen (TYLENOL) 500 MG tablet Take 1,000 mg every 6 (six) hours as needed by mouth for moderate pain or headache.    [name] amitriptyline (ELAVIL) 25 MG tablet Take 75 mg at bedtime by mouth.     . EPINEPHrine (EPIPEN 2-PAK) 0.3 mg/0.3 mL IJ SOAJ injection Inject 0.3 mg once into the muscle.     . gabapentin (NEURONTIN) 300 MG capsule TAKE ONE CAPSULE BY MOUTH 4 TIMES A DAY 360 capsule 1  . HYDROcodone-acetaminophen (NORCO) 10-325 MG tablet Take 1 tablet by mouth every 4 (four) hours as needed for moderate pain ((score 4 to 6)). 60 tablet 0  . hydrocortisone 2.5 % cream Apply 1 application topically at bedtime as needed (for rash).     [name] levothyroxine (SYNTHROID, LEVOTHROID) 25 MCG tablet Take 1 tablet (25 mcg total) by mouth daily before  breakfast. 2 days a week, take 2 tablets (44mcg) (Patient taking differently: Take 25-50 mcg See admin instructions by mouth. Take 50 mcg by mouth daily on Sunday and Wednesday. Take 25 mcg by mouth daily on all other days.) 110 tablet 1  . Multiple Vitamins-Minerals (CENTRUM SILVER PO) Take 1 tablet daily by mouth.    . pantoprazole (PROTONIX) 40 MG tablet TAKE 1 TABLET (40 MG TOTAL) BY MOUTH DAILY BEFORE BREAKFAST. 30 tablet 8  . Probiotic Product (PROBIOTIC PO) Take 1 capsule daily by mouth.    . SUMAtriptan (IMITREX) 50 MG tablet TAKE 1 TABLET (50 MG TOTAL) BY MOUTH ONCE. MAY REPEAT IN 2 HOURS IF HEADACHE PERSISTS OR RECURS. 12 tablet 3   No facility-administered medications prior to visit.      Per HPI unless specifically indicated in ROS section below Review of Systems     Objective:    BP 138/86 (BP Location: Left Arm, Patient Position: Sitting, Cuff Size: Normal)   Pulse 73   Temp 98.2 F (36.8 C) (Oral)   Wt 263 lb (119.3 kg)   SpO2 93%   BMI 38.84 kg/m   Wt Readings from Last 3 Encounters:  10/01/17 263 lb (119.3 kg)  09/04/17 263 lb 7.2 oz (119.5 kg)  08/21/17 263 lb 8 oz (119.5 kg)    Physical Exam  Constitutional: He appears well-developed and well-nourished. No distress.  HENT:  Mouth/Throat: Oropharynx is  clear and moist. No oropharyngeal exudate.  Eyes: Conjunctivae and EOM are normal. Pupils are equal, round, and reactive to light. No scleral icterus.  Neck: Normal range of motion. Neck supple.  Cardiovascular: Normal rate, regular rhythm, normal heart sounds and intact distal pulses.  No murmur heard. Pulmonary/Chest: Effort normal and breath sounds normal. No respiratory distress. He has no wheezes. He has no rales.  Musculoskeletal: He exhibits no edema.  Skin: Skin is warm and dry. No rash noted.  Psychiatric: He has a normal mood and affect.  Nursing note and vitals reviewed.  Lab Results  Component Value Date   CREATININE 1.47 (H) 08/21/2017    Lab  Results  Component Value Date   TSH 9.84 (H) 05/24/2017       Assessment & Plan:   Problem List Items Addressed This Visit    Chronic pain syndrome    Pain meds currently prescribed by ortho. Taking 1.5 hydrocodone 10/325mg  daily.       Fatty liver    Pt was advised he may have fatty liver at recent SNF - will recheck LFTs today. I don't see any recent imaging.       Hypothyroidism - Primary    Update TFTs today. Pt states he's taking 29mcg levothyroxine daily.       Relevant Orders   TSH   T4, free   Migraine    Very well controlled with imitrex. Advised to check to see how many imitrex insurance will cover. If persistent frequent migraines, we will increase amitriptyline.       Osteoarthritis    Chronic, severe. Planned eval by hand surgeon for severe erosive wrist osteoarthritis.       Renal insufficiency    Cr recently bumped from 1.2 --> 1.4 during recent surgery. Will recheck levels today.       Relevant Orders   Comprehensive metabolic panel   Status post reverse total replacement of left shoulder    Appreciate ortho care. Pt very satisfied with recent surgery.           Follow up plan: Return in about 3 months (around 12/30/2017) for annual exam, prior fasting for blood work, medicare wellness visit.  [name], MD

## 2017-10-01 NOTE — Patient Instructions (Addendum)
Labs today to check thyroid and liver and kidneys - we will be in touch with thyroid dosing plan. You are doing well. Return as needed or in 3 months for medicare wellness visit with Katha Cabal and physical with me.

## 2017-10-01 NOTE — Assessment & Plan Note (Signed)
Appreciate ortho care. Pt very satisfied with recent surgery.

## 2017-10-01 NOTE — Assessment & Plan Note (Addendum)
Pt was advised he may have fatty liver at recent SNF - will recheck LFTs today. I don't see any recent imaging.

## 2017-10-01 NOTE — Assessment & Plan Note (Signed)
Update TFTs today. Pt states he's taking 55mcg levothyroxine daily.

## 2017-10-01 NOTE — Assessment & Plan Note (Signed)
Pain meds currently prescribed by ortho. Taking 1.5 hydrocodone 10/325mg  daily.

## 2017-10-01 NOTE — Assessment & Plan Note (Signed)
Very well controlled with imitrex. Advised to check to see how many imitrex insurance will cover. If persistent frequent migraines, we will increase amitriptyline.

## 2017-10-05 ENCOUNTER — Other Ambulatory Visit: Payer: Self-pay | Admitting: Family Medicine

## 2017-10-05 MED ORDER — LEVOTHYROXINE SODIUM 50 MCG PO TABS: 50.0000 ug | ORAL_TABLET | Freq: Every day | ORAL | 1 refills | Status: DC

## 2017-10-09 ENCOUNTER — Ambulatory Visit: Payer: PPO

## 2017-10-11 ENCOUNTER — Encounter: Payer: Self-pay | Admitting: Physical Therapy

## 2017-10-11 ENCOUNTER — Ambulatory Visit: Payer: PPO | Attending: Orthopedic Surgery | Admitting: Physical Therapy

## 2017-10-11 DIAGNOSIS — M25612 Stiffness of left shoulder, not elsewhere classified: Secondary | ICD-10-CM | POA: Diagnosis not present

## 2017-10-11 DIAGNOSIS — G8929 Other chronic pain: Secondary | ICD-10-CM | POA: Diagnosis not present

## 2017-10-11 DIAGNOSIS — M6281 Muscle weakness (generalized): Secondary | ICD-10-CM | POA: Diagnosis not present

## 2017-10-11 DIAGNOSIS — M25512 Pain in left shoulder: Secondary | ICD-10-CM | POA: Insufficient documentation

## 2017-10-11 NOTE — Therapy (Signed)
[place], [place], [address] Phone: [phone]   Fax:  [phone]  Physical Therapy Evaluation  Patient Details  Name: Nicholas [name] MRN: [id number] Date of Birth: 11/17/1950 Referring Provider: Tonna Corner dean MD   Encounter Date: 10/11/2017  PT End of Session - 10/11/17 1245    Visit Number  1    Number of Visits  13    Date for PT Re-Evaluation  11/22/17    Authorization Type  MCR: Kx mod by 15th visit, progress note by 10th visit    PT Start Time  1145    PT Stop Time  1232    PT Time Calculation (min)  47 min    Activity Tolerance  Patient tolerated treatment well    Behavior During Therapy  [facility] for tasks assessed/performed       Past Medical History:  Diagnosis Date  . Acute kidney failure 08/2008   "cleared up"  no problems since  . Allergy   . Anxiety   . Arthritis   . Asthma    ?of this no inhaler  . Blood transfusion   . CHF (congestive heart failure) ([place])    ?of this, pt denies  . CLOSTRIDIUM DIFFICILE COLITIS 07/04/2010   Annotation: 12/09, 2/10 Qualifier: Diagnosis of  By: [name], [name]    . Depression   . Depression with anxiety   . Duodenitis determined by biopsy 02/2016   peptic likely due to aleve (erosive gastropathy with duodenal erosions)  . ECZEMA 07/04/2010   Qualifier: Diagnosis of  By: [name], [name]    . Elevated liver enzymes   . GERD (gastroesophageal reflux disease)   . Heart attack ([place])    08/2008 (likely demand ischemia in the setting of MSSA sepsis/R TKA  infection)10-2008  . History of hiatal hernia   . History of stomach ulcers   . Hypothyroidism   . Lower GI bleeding   . MALAR AND MAXILLARY BONES CLOSED FRACTURE 07/04/2010   Annotation: ORIF Qualifier: Diagnosis of  By: [name], [name]    . Migraine    "definitely"  . MRSA (methicillin resistant Staphylococcus aureus)    in leg, had to place steel rod in leg  . Personal history of  colonic adenoma 06/01/2003  . PFO (patent foramen ovale)    small PFO by 08/2008 TEE  . Pneumonia 08/2008   "while in ICU"  . Seizures ([place]) 2009   "long time ago"    . Stroke [facility]) 2010   unable to complete sentences at times    Past Surgical History:  Procedure Laterality Date  . anterior  nerve transposition  07/2009   left ulnar nerve  . antibiotic spacer exchange  11/2008; 08/2006   right knee  . ARTHROTOMY  08/2008   right knee w/I&D  . CATARACT EXTRACTION    . COLONOSCOPY  11/2012   diverticulosis, rpt 10 yrs [name])  . ESOPHAGOGASTRODUODENOSCOPY  02/2016   erosive gastropathy with duodenal erosions [name])  . HARDWARE REMOVAL  04/2006   right knee w/antibiotic spacers placed  . INGUINAL HERNIA REPAIR  early 1990's   bilateral  . JOINT REPLACEMENT Right    rt knee  . KNEE ARTHROSCOPY  10/2001   right  . KNEE FUSION  03/2009   right knee removal; antibiotic spacers;   . REPLACEMENT TOTAL KNEE  08/2008; 09/2001   right  . REVERSE SHOULDER ARTHROPLASTY Left 09/03/2017   Procedure: REVERSE SHOULDER ARTHROPLASTY;  Surgeon: Meredith Pel, MD;  Location: [place];  Service: Orthopedics;  Laterality: Left;  . rod placement Right   . SYNOVECTOMY  06/2005   debridement, liner exchange right knee  . TOE AMPUTATION Left 08/2008   great toe  . TOE AMPUTATION Left 04/2016   2nd toe [name]  . TONSILLECTOMY    . TOTAL KNEE ARTHROPLASTY Left 01/15/2017  . TOTAL KNEE ARTHROPLASTY Left 01/15/2017   Procedure: TOTAL KNEE ARTHROPLASTY;  Surgeon: Meredith Pel, MD;  Location: [place];  Service: Orthopedics;  Laterality: Left;    There were no vitals filed for this visit.   Subjective Assessment - 10/11/17 1156    Subjective  pt is a 66 y.o M s/p L reverse total shoulder replacement performed on 09/03/2017. Since the surgery she reports he is doing very well and has had no pain in the shoulder or N/T reported. reports mostly having increased weakness and and pain inthe L hand.     Pertinent History  significant past surgical hx     Limitations  House hold activities;Lifting    How long can you sit comfortably?  unlimited    How long can you stand comfortably?  10 -15 min    How long can you walk comfortably?  10-15 min    Diagnostic tests  x-ray 11/30    Patient Stated Goals  to be able to get my hand above my head    Currently in Pain?  Yes    Pain Score  0-No pain    Pain Orientation  Left    Pain Type  Surgical pain    Pain Frequency  Rarely    Aggravating Factors   N/A    Pain Relieving Factors  N/A    Multiple Pain Sites  Yes    Pain Score  10    Pain Location  Hand    Pain Orientation  Left    Pain Descriptors / Indicators  Sharp;Stabbing    Pain Type  Chronic pain    Pain Onset  More than a month ago    Pain Frequency  Constant    Aggravating Factors   any activity/ movement    Pain Relieving Factors  medication, ice/ heat    Effect of Pain on Daily Activities  limited strength and use of the L hand         [facility] PT Assessment - 10/11/17 1157      Assessment   Medical Diagnosis  S/p L reverse total shoulder    Referring Provider  gregory scott dean MD    Onset Date/Surgical Date  09/03/17    Hand Dominance  Right    Next MD Visit  after the first of theyear    Prior Therapy  yes      Precautions   Precautions  None      Restrictions   Weight Bearing Restrictions  No      Balance Screen   Has the patient fallen in the past 6 months  Yes    How many times?  3    Has the patient had a decrease in activity level because of a fear of falling?   Yes    Is the patient reluctant to leave their home because of a fear of falling?   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   Overall Cognitive Status  Within Functional Limits for tasks assessed      Observation/Other Assessments   Focus on Therapeutic Outcomes (FOTO)   51% limited predicted 37% limited      Posture/Postural Control   Posture/Postural Control  Postural limitations      ROM / Strength   AROM / PROM / Strength  AROM;PROM;Strength      AROM   AROM Assessment Site  Shoulder    Right/Left Shoulder  Right;Left    Right Shoulder Extension  50 Degrees    Right Shoulder Flexion  80 Degrees ERP    Right Shoulder ABduction  55 Degrees with ERP and shoulder hiking    Right Shoulder Internal Rotation  60 Degrees in nuetral, to stomach    Right Shoulder External Rotation  8 Degrees in nuetral,     Left Shoulder Extension  42 Degrees    Left Shoulder Flexion  95 Degrees    Left Shoulder ABduction  88 Degrees    Left Shoulder Internal Rotation  60 Degrees in neutral, to stomach    Left Shoulder External Rotation  15 Degrees in neutral      PROM   PROM Assessment Site  Shoulder    Right/Left Shoulder  Left    Left Shoulder Flexion  105 Degrees    Left Shoulder ABduction  80 Degrees    Left Shoulder Internal Rotation  40 Degrees with shoulder abd to 45 degrees    Left Shoulder External Rotation  8 Degrees with shoulder abd to 45 degrees      Strength   Strength Assessment Site  Shoulder;Elbow;Hand    Right/Left Shoulder  Right;Left    Right Shoulder Flexion  4-/5    Right Shoulder Extension  4/5 in available ROME    Right Shoulder ABduction  3+/5 in available ROM    Right Shoulder Internal Rotation  4+/5    Right Shoulder External Rotation  4+/5    Left Shoulder Flexion  4-/5    Left Shoulder Extension  4/5 in available ROM    Left Shoulder ABduction  3+/5 in available ROM    Left Shoulder Internal Rotation  4/5    Left  Shoulder External Rotation  3+/5    Right Elbow Flexion  4/5    Right Elbow Extension  4/5    Left Elbow Flexion  4-/5    Left Elbow Extension  4-/5    Right Hand Grip (lbs)   46 45,48,45    Left Hand Grip (lbs)  12.3 14,13,10      Palpation   Palpation comment  TTP along distal ulna on the L wrist      Ambulation/Gait   Ambulation/Gait  Yes    Gait Pattern  Step-to pattern;Decreased stride length;Antalgic    Gait Comments  due to previous surgery unable to bend R knee             Objective measurements completed on examination: See above findings.  PT Education - 10/11/17 1243    Education provided  Yes    Education Details  evaluation findings, POC, goals, HEP with proper form/ rationale. progressing appropriately.     Person(s) Educated  Patient    Methods  Explanation;Verbal cues;Handout    Comprehension  Verbalized understanding;Verbal cues required       PT Short Term Goals - 10/11/17 1253      PT SHORT TERM GOAL #1   Title  pt to be I with inital HEP     Time  3    Period  Weeks    Status  New    Target Date  11/01/17      PT SHORT TERM GOAL #2   Title  pt to verbalize / demo proper posture with lifting/ carrying mechanics to prevent/ reduce shoulder/ wrist pain    Time  4    Period  Weeks    Status  New    Target Date  11/01/17        PT Long Term Goals - 10/11/17 1255      PT LONG TERM GOAL #1   Title  pt to increase shoulder flexion/ abduction by >/= 10 degreee and ER to >/= 30 degrees to promote functional mobility required for ADLs     Time  6    Period  Weeks    Status  New    Target Date  11/22/17      PT LONG TERM GOAL #2   Title  increase L shoulder strength to >/= 4/5 to assist with shoulder stability with liftin/carrying activities     Time  6    Period  Weeks    Status  New    Target Date  11/22/17      PT LONG TERM GOAL #3   Title  pt to be able to lift/lower >/= 8# to/from an overhead shelf and push/  pull >/= 10# for functional strength/ mobility     Time  6    Period  Weeks    Status  New    Target Date  11/22/17      PT LONG TERM GOAL #4   Title  increase FOTO score to >/= 37% limited to demo improvement in function     Time  6    Period  Weeks    Status  New    Target Date  11/22/17      PT LONG TERM GOAL #5   Title  pt to be I with all HEP given as of last visit     Time  6    Period  Weeks    Status  New    Target Date  11/22/17             Plan - 10/11/17 1247    Clinical Impression Statement  pt presents to OPPT s/p L reverse total shoulder on 09/03/2017. bil limited shoulder mobility comparing AROM to PROM in the [place] he is demontrates improving L shoulder ROM since surgery. pt reports most of the soreness is in the L wrist/ hand, with no pain inthe shoulder. pt would benefit from physical therapy to improve shoulder ROM, increase strength and maximize his function by addressing the deficits listed.     Clinical Presentation  Stable    Clinical Decision Making  Low    Rehab Potential  Good    PT Frequency  2x / week    PT Duration  6 weeks  PT Treatment/Interventions  ADLs/Self Care Home Management;Cryotherapy;Electrical Stimulation;Iontophoresis 4mg /ml Dexamethasone;Moist Heat;Ultrasound;Patient/family education;Therapeutic activities;Therapeutic exercise;Manual techniques;Functional mobility training;Neuromuscular re-education;Passive range of motion;Dry needling;Taping    PT Next Visit Plan  review and update HEP, shoulder A/AAROM, if isometrics go well progress gradually with isotonics, gripping exercises,     PT Home Exercise Plan  shoulder isometrics, wand flexion/ abduction, wall walks.     Consulted and Agree with Plan of Care  Patient       Patient will benefit from skilled therapeutic intervention in order to improve the following deficits and impairments:  Pain, Improper body mechanics, Postural dysfunction, Decreased strength, Decreased activity  tolerance, Decreased endurance, Decreased range of motion, Decreased mobility, Impaired UE functional use, Hypomobility  Visit Diagnosis: Chronic left shoulder pain  Muscle weakness (generalized)  Stiffness of left shoulder, not elsewhere classified  G-Codes - 03-Nov-2017 1259    Functional Assessment Tool Used (Outpatient Only)  ROM, strength, clinical judgement/ FOTO     Functional Limitation  Changing and maintaining body position    Changing and Maintaining Body Position Goal Status (Y6948)  At least 40 percent but less than 60 percent impaired, limited or restricted    Changing and Maintaining Body Position Discharge Status (N4627)  At least 20 percent but less than 40 percent impaired, limited or restricted        Problem List Patient Active Problem List   Diagnosis Date Noted  . Renal insufficiency 10/01/2017  . Status post reverse total replacement of left shoulder 10/01/2017  . Fatty liver 10/01/2017  . Shoulder arthritis 09/03/2017  . Bilateral shoulder pain 05/24/2017  . History of CVA in adulthood 03/08/2017  . S/P total knee arthroplasty, left 01/30/2017  . Arthritis of knee 01/15/2017  . Skin rash 12/10/2016  . Medicare annual wellness visit, initial 10/05/2016  . Advanced care planning/counseling discussion 10/05/2016  . Diarrhea 08/21/2016  . Obesity, Class II, BMI 35-39.9, with comorbidity 07/19/2016  . Hypothyroidism   . IDA (iron deficiency anemia) 04/23/2016  . Chronic pain syndrome 04/23/2016  . Inflammatory arthritis 04/23/2016  . Chronic leg pain 04/23/2016  . Lesion of palate 04/23/2016  . Grieving 04/23/2016  . GERD (gastroesophageal reflux disease)   . Duodenitis determined by biopsy 02/13/2016  . Diverticulosis of colon without hemorrhage 01/31/2016  . Migraine 01/11/2012  . History of methicillin resistant staphylococcus aureus (MRSA) 07/04/2010  . DEGENERATIVE DISC DISEASE 07/04/2010  . History of total right knee replacement 07/04/2010  .  Osteoarthritis 06/09/2007  . Personal history of colonic adenoma 06/01/2003   Starr Lake PT, DPT, LAT, ATC  2017-11-03  1:00 PM      [facility] [address] [place], [place], [address] Phone: [phone]   Fax:  [phone]  Name: Nicholas [name] MRN: [id number] Date of Birth: 1950-11-30

## 2017-10-15 ENCOUNTER — Other Ambulatory Visit: Payer: Self-pay | Admitting: Family Medicine

## 2017-10-15 DIAGNOSIS — K760 Fatty (change of) liver, not elsewhere classified: Secondary | ICD-10-CM

## 2017-10-15 DIAGNOSIS — N289 Disorder of kidney and ureter, unspecified: Secondary | ICD-10-CM

## 2017-10-15 DIAGNOSIS — D509 Iron deficiency anemia, unspecified: Secondary | ICD-10-CM

## 2017-10-15 DIAGNOSIS — E039 Hypothyroidism, unspecified: Secondary | ICD-10-CM

## 2017-10-16 ENCOUNTER — Ambulatory Visit: Payer: PPO

## 2017-10-17 ENCOUNTER — Ambulatory Visit: Payer: PPO | Attending: Orthopedic Surgery | Admitting: Physical Therapy

## 2017-10-17 DIAGNOSIS — M6281 Muscle weakness (generalized): Secondary | ICD-10-CM | POA: Diagnosis not present

## 2017-10-17 DIAGNOSIS — G8929 Other chronic pain: Secondary | ICD-10-CM

## 2017-10-17 DIAGNOSIS — M25612 Stiffness of left shoulder, not elsewhere classified: Secondary | ICD-10-CM | POA: Diagnosis not present

## 2017-10-17 DIAGNOSIS — M25512 Pain in left shoulder: Secondary | ICD-10-CM | POA: Diagnosis not present

## 2017-10-17 NOTE — Therapy (Signed)
[place], [place], [address] Phone: [phone]   Fax:  [phone]  Physical Therapy Treatment  Patient Details  Name: Nicholas [name] MRN: [id number] Date of Birth: 20-Jan-1951 Referring Provider: Tonna Corner dean MD   Encounter Date: 10/17/2017  PT End of Session - 10/17/17 1534    Visit Number  2    Number of Visits  13    Date for PT Re-Evaluation  11/22/17    Authorization Type  MCR: Kx mod by 15th visit, progress note by 10th visit    PT Start Time  1455    PT Stop Time  1536    PT Time Calculation (min)  41 min    Activity Tolerance  Patient tolerated treatment well    Behavior During Therapy  [facility] for tasks assessed/performed       Past Medical History:  Diagnosis Date  . Acute kidney failure 08/2008   "cleared up"  no problems since  . Allergy   . Anxiety   . Arthritis   . Asthma    ?of this no inhaler  . Blood transfusion   . CHF (congestive heart failure) ([place])    ?of this, pt denies  . CLOSTRIDIUM DIFFICILE COLITIS 07/04/2010   Annotation: 12/09, 2/10 Qualifier: Diagnosis of  By: [name], [name]    . Depression   . Depression with anxiety   . Duodenitis determined by biopsy 02/2016   peptic likely due to aleve (erosive gastropathy with duodenal erosions)  . ECZEMA 07/04/2010   Qualifier: Diagnosis of  By: [name], [name]    . Elevated liver enzymes   . GERD (gastroesophageal reflux disease)   . Heart attack ([place])    08/2008 (likely demand ischemia in the setting of MSSA sepsis/R TKA  infection)10-2008  . History of hiatal hernia   . History of stomach ulcers   . Hypothyroidism   . Lower GI bleeding   . MALAR AND MAXILLARY BONES CLOSED FRACTURE 07/04/2010   Annotation: ORIF Qualifier: Diagnosis of  By: [name], [name]    . Migraine    "definitely"  . MRSA (methicillin resistant Staphylococcus aureus)    in leg, had to place steel rod in leg  . Personal history of colonic  adenoma 06/01/2003  . PFO (patent foramen ovale)    small PFO by 08/2008 TEE  . Pneumonia 08/2008   "while in ICU"  . Seizures ([place]) 2009   "long time ago"    . Stroke [facility]) 2010   unable to complete sentences at times    Past Surgical History:  Procedure Laterality Date  . anterior  nerve transposition  07/2009   left ulnar nerve  . antibiotic spacer exchange  11/2008; 08/2006   right knee  . ARTHROTOMY  08/2008   right knee w/I&D  . CATARACT EXTRACTION    . COLONOSCOPY  11/2012   diverticulosis, rpt 10 yrs [name])  . ESOPHAGOGASTRODUODENOSCOPY  02/2016   erosive gastropathy with duodenal erosions [name])  . HARDWARE REMOVAL  04/2006   right knee w/antibiotic spacers placed  . INGUINAL HERNIA REPAIR  early 1990's   bilateral  . JOINT REPLACEMENT Right    rt knee  . KNEE ARTHROSCOPY  10/2001   right  . KNEE FUSION  03/2009   right knee removal; antibiotic spacers;   . REPLACEMENT TOTAL KNEE  08/2008; 09/2001   right  . REVERSE SHOULDER ARTHROPLASTY Left 09/03/2017   Procedure: REVERSE SHOULDER ARTHROPLASTY;  Surgeon: Meredith Pel, MD;  Location: [place];  Service: Orthopedics;  Laterality: Left;  . rod placement Right   . SYNOVECTOMY  06/2005   debridement, liner exchange right knee  . TOE AMPUTATION Left 08/2008   great toe  . TOE AMPUTATION Left 04/2016   2nd toe [name]  . TONSILLECTOMY    . TOTAL KNEE ARTHROPLASTY Left 01/15/2017  . TOTAL KNEE ARTHROPLASTY Left 01/15/2017   Procedure: TOTAL KNEE ARTHROPLASTY;  Surgeon: Meredith Pel, MD;  Location: [place];  Service: Orthopedics;  Laterality: Left;    There were no vitals filed for this visit.  Subjective Assessment - 10/17/17 1457    Subjective  shoulder is feeling pretty good, hand is still very painful    Currently in Pain?  No/denies    Pain Score  5    Pain Location  Hand    Pain Orientation  Left    Pain Descriptors / Indicators  Sharp;Stabbing    Pain Type  Chronic pain    Pain Onset  More than  a month ago    Pain Frequency  Constant    Aggravating Factors   any activity/movement    Pain Relieving Factors  medication, ice/heat                      [facility] Adult PT Treatment/Exercise - 10/17/17 1459      Exercises   Exercises  Shoulder      Shoulder Exercises: Supine   External Rotation  Left;15 reps;AAROM cane; mod cues for technique    Flexion  AAROM;15 reps cane    ABduction  AAROM;15 reps cane      Shoulder Exercises: Standing   Flexion  10 reps wall ladder    Row  Both;15 reps;Theraband    Theraband Level (Shoulder Row)  Level 4 (Blue)      Shoulder Exercises: Pulleys   Flexion  3 minutes      Manual Therapy   Manual Therapy  Passive ROM    Passive ROM  Lt shoulder flex/abdct/er/ir with end range holds               PT Short Term Goals - 10/11/17 1253      PT SHORT TERM GOAL #1   Title  pt to be I with inital HEP     Time  3    Period  Weeks    Status  New    Target Date  11/01/17      PT SHORT TERM GOAL #2   Title  pt to verbalize / demo proper posture with lifting/ carrying mechanics to prevent/ reduce shoulder/ wrist pain    Time  4    Period  Weeks    Status  New    Target Date  11/01/17        PT Long Term Goals - 10/11/17 1255      PT LONG TERM GOAL #1   Title  pt to increase shoulder flexion/ abduction by >/= 10 degreee and ER to >/= 30 degrees to promote functional mobility required for ADLs     Time  6    Period  Weeks    Status  New    Target Date  11/22/17      PT LONG TERM GOAL #2   Title  increase L shoulder strength to >/= 4/5 to assist with shoulder stability with liftin/carrying activities     Time  6  Period  Weeks    Status  New    Target Date  11/22/17      PT LONG TERM GOAL #3   Title  pt to be able to lift/lower >/= 8# to/from an overhead shelf and push/ pull >/= 10# for functional strength/ mobility     Time  6    Period  Weeks    Status  New    Target Date  11/22/17      PT LONG TERM  GOAL #4   Title  increase FOTO score to >/= 37% limited to demo improvement in function     Time  6    Period  Weeks    Status  New    Target Date  11/22/17      PT LONG TERM GOAL #5   Title  pt to be I with all HEP given as of last visit     Time  6    Period  Weeks    Status  New    Target Date  11/22/17            Plan - 10/17/17 1534    Clinical Impression Statement  Pt tolerated session well needing min cues for HEP reviewed today.  Mostly limited by pain in RUE and Lt wrist/hand.  No goals met as only 2nd visit, but doing well with PT.    PT Treatment/Interventions  ADLs/Self Care Home Management;Cryotherapy;Electrical Stimulation;Iontophoresis 64m/ml Dexamethasone;Moist Heat;Ultrasound;Patient/family education;Therapeutic activities;Therapeutic exercise;Manual techniques;Functional mobility training;Neuromuscular re-education;Passive range of motion;Dry needling;Taping    PT Next Visit Plan  review and update HEP (need to review isometrics), shoulder A/AAROM, if isometrics go well progress gradually with isotonics, gripping exercises,     PT Home Exercise Plan  shoulder isometrics, wand flexion/ abduction, wall walks.     Consulted and Agree with Plan of Care  Patient       Patient will benefit from skilled therapeutic intervention in order to improve the following deficits and impairments:  Pain, Improper body mechanics, Postural dysfunction, Decreased strength, Decreased activity tolerance, Decreased endurance, Decreased range of motion, Decreased mobility, Impaired UE functional use, Hypomobility  Visit Diagnosis: Chronic left shoulder pain  Muscle weakness (generalized)  Stiffness of left shoulder, not elsewhere classified     Problem List Patient Active Problem List   Diagnosis Date Noted  . Renal insufficiency 10/01/2017  . Status post reverse total replacement of left shoulder 10/01/2017  . Fatty liver 10/01/2017  . Shoulder arthritis 09/03/2017  .  Bilateral shoulder pain 05/24/2017  . History of CVA in adulthood 03/08/2017  . S/P total knee arthroplasty, left 01/30/2017  . Arthritis of knee 01/15/2017  . Skin rash 12/10/2016  . Medicare annual wellness visit, initial 10/05/2016  . Advanced care planning/counseling discussion 10/05/2016  . Diarrhea 08/21/2016  . Obesity, Class II, BMI 35-39.9, with comorbidity 07/19/2016  . Hypothyroidism   . IDA (iron deficiency anemia) 04/23/2016  . Chronic pain syndrome 04/23/2016  . Inflammatory arthritis 04/23/2016  . Chronic leg pain 04/23/2016  . Lesion of palate 04/23/2016  . Grieving 04/23/2016  . GERD (gastroesophageal reflux disease)   . Duodenitis determined by biopsy 02/13/2016  . Diverticulosis of colon without hemorrhage 01/31/2016  . Migraine 01/11/2012  . History of methicillin resistant staphylococcus aureus (MRSA) 07/04/2010  . DEGENERATIVE DISC DISEASE 07/04/2010  . History of total right knee replacement 07/04/2010  . Osteoarthritis 06/09/2007  . Personal history of colonic adenoma 06/01/2003      SLaureen Abrahams PT,  DPT 10/17/17 3:37 PM    [place], [place], [address] Phone: [phone]   Fax:  [phone]  Name: Nicholas [name] MRN: [id number] Date of Birth: Jul 26, 1951

## 2017-10-18 ENCOUNTER — Encounter: Payer: PPO | Admitting: Family Medicine

## 2017-10-18 ENCOUNTER — Encounter: Payer: Self-pay | Admitting: Physical Therapy

## 2017-10-18 DIAGNOSIS — Z0289 Encounter for other administrative examinations: Secondary | ICD-10-CM

## 2017-10-22 ENCOUNTER — Ambulatory Visit: Payer: PPO | Admitting: Physical Therapy

## 2017-10-22 ENCOUNTER — Encounter: Payer: Self-pay | Admitting: Physical Therapy

## 2017-10-22 DIAGNOSIS — M25612 Stiffness of left shoulder, not elsewhere classified: Secondary | ICD-10-CM

## 2017-10-22 DIAGNOSIS — M25512 Pain in left shoulder: Secondary | ICD-10-CM | POA: Diagnosis not present

## 2017-10-22 DIAGNOSIS — G8929 Other chronic pain: Secondary | ICD-10-CM

## 2017-10-22 DIAGNOSIS — M6281 Muscle weakness (generalized): Secondary | ICD-10-CM

## 2017-10-22 NOTE — Therapy (Signed)
[place], [place], [address] Phone: [phone]   Fax:  [phone]  Physical Therapy Treatment  Patient Details  Name: Nicholas [name] MRN: [id number] Date of Birth: 1951-01-16 Referring Provider: Tonna Corner dean MD   Encounter Date: 10/22/2017  PT End of Session - 10/22/17 1450    Visit Number  3    Number of Visits  13    Date for PT Re-Evaluation  11/22/17    Authorization Type  MCR: Kx mod by 15th visit, progress note by 10th visit    PT Start Time  1407    PT Stop Time  1450    PT Time Calculation (min)  43 min    Activity Tolerance  Patient tolerated treatment well    Behavior During Therapy  [facility] for tasks assessed/performed       Past Medical History:  Diagnosis Date  . Acute kidney failure 08/2008   "cleared up"  no problems since  . Allergy   . Anxiety   . Arthritis   . Asthma    ?of this no inhaler  . Blood transfusion   . CHF (congestive heart failure) ([place])    ?of this, pt denies  . CLOSTRIDIUM DIFFICILE COLITIS 07/04/2010   Annotation: 12/09, 2/10 Qualifier: Diagnosis of  By: [name], [name]    . Depression   . Depression with anxiety   . Duodenitis determined by biopsy 02/2016   peptic likely due to aleve (erosive gastropathy with duodenal erosions)  . ECZEMA 07/04/2010   Qualifier: Diagnosis of  By: [name], [name]    . Elevated liver enzymes   . GERD (gastroesophageal reflux disease)   . Heart attack ([place])    08/2008 (likely demand ischemia in the setting of MSSA sepsis/R TKA  infection)10-2008  . History of hiatal hernia   . History of stomach ulcers   . Hypothyroidism   . Lower GI bleeding   . MALAR AND MAXILLARY BONES CLOSED FRACTURE 07/04/2010   Annotation: ORIF Qualifier: Diagnosis of  By: [name], [name]    . Migraine    "definitely"  . MRSA (methicillin resistant Staphylococcus aureus)    in leg, had to place steel rod in leg  . Personal history of colonic  adenoma 06/01/2003  . PFO (patent foramen ovale)    small PFO by 08/2008 TEE  . Pneumonia 08/2008   "while in ICU"  . Seizures ([place]) 2009   "long time ago"    . Stroke [facility]) 2010   unable to complete sentences at times    Past Surgical History:  Procedure Laterality Date  . anterior  nerve transposition  07/2009   left ulnar nerve  . antibiotic spacer exchange  11/2008; 08/2006   right knee  . ARTHROTOMY  08/2008   right knee w/I&D  . CATARACT EXTRACTION    . COLONOSCOPY  11/2012   diverticulosis, rpt 10 yrs [name])  . ESOPHAGOGASTRODUODENOSCOPY  02/2016   erosive gastropathy with duodenal erosions [name])  . HARDWARE REMOVAL  04/2006   right knee w/antibiotic spacers placed  . INGUINAL HERNIA REPAIR  early 1990's   bilateral  . JOINT REPLACEMENT Right    rt knee  . KNEE ARTHROSCOPY  10/2001   right  . KNEE FUSION  03/2009   right knee removal; antibiotic spacers;   . REPLACEMENT TOTAL KNEE  08/2008; 09/2001   right  . REVERSE SHOULDER ARTHROPLASTY Left 09/03/2017   Procedure: REVERSE SHOULDER ARTHROPLASTY;  Surgeon: Meredith Pel, MD;  Location: [place];  Service: Orthopedics;  Laterality: Left;  . rod placement Right   . SYNOVECTOMY  06/2005   debridement, liner exchange right knee  . TOE AMPUTATION Left 08/2008   great toe  . TOE AMPUTATION Left 04/2016   2nd toe [name]  . TONSILLECTOMY    . TOTAL KNEE ARTHROPLASTY Left 01/15/2017  . TOTAL KNEE ARTHROPLASTY Left 01/15/2017   Procedure: TOTAL KNEE ARTHROPLASTY;  Surgeon: Meredith Pel, MD;  Location: [place];  Service: Orthopedics;  Laterality: Left;    There were no vitals filed for this visit.  Subjective Assessment - 10/22/17 1409    Subjective  tired today due to working on moving belongings downstairs in house.      Patient Stated Goals  to be able to get my hand above my head    Currently in Pain?  No/denies    Pain Score  0                      [facility] Adult PT Treatment/Exercise -  10/22/17 1410      Shoulder Exercises: Supine   Protraction  Strengthening;Left;10 reps;Weights    Protraction Weight (lbs)  2    Horizontal ABduction  Both;10 reps;Theraband    Theraband Level (Shoulder Horizontal ABduction)  Level 1 (Yellow)    Flexion  Strengthening;Weights;10 reps    Shoulder Flexion Weight (lbs)  2      Shoulder Exercises: Standing   Internal Rotation  Left;Theraband;15 reps    Theraband Level (Shoulder Internal Rotation)  Level 2 (Red)    Extension  Both;Theraband;10 reps stopped at 10 reps due to wrist pain    Theraband Level (Shoulder Extension)  Level 4 (Blue)    Row  Both;15 reps;Theraband    Theraband Level (Shoulder Row)  Level 4 (Blue)      Shoulder Exercises: Pulleys   Flexion  3 minutes    ABduction  3 minutes      Shoulder Exercises: Isometric Strengthening   Flexion  -- 10x5"    Extension  -- 10x5"    External Rotation  -- 10x5"; increased difficulty and substitution needed    Internal Rotation  -- 10x5"      Manual Therapy   Manual Therapy  Passive ROM    Passive ROM  Lt shoulder flex/abdct/er/ir with end range holds               PT Short Term Goals - 10/11/17 1253      PT SHORT TERM GOAL #1   Title  pt to be I with inital HEP     Time  3    Period  Weeks    Status  New    Target Date  11/01/17      PT SHORT TERM GOAL #2   Title  pt to verbalize / demo proper posture with lifting/ carrying mechanics to prevent/ reduce shoulder/ wrist pain    Time  4    Period  Weeks    Status  New    Target Date  11/01/17        PT Long Term Goals - 10/11/17 1255      PT LONG TERM GOAL #1   Title  pt to increase shoulder flexion/ abduction by >/= 10 degreee and ER to >/= 30 degrees to promote functional mobility required for ADLs     Time  6    Period  Weeks  Status  New    Target Date  11/22/17      PT LONG TERM GOAL #2   Title  increase L shoulder strength to >/= 4/5 to assist with shoulder stability with liftin/carrying  activities     Time  6    Period  Weeks    Status  New    Target Date  11/22/17      PT LONG TERM GOAL #3   Title  pt to be able to lift/lower >/= 8# to/from an overhead shelf and push/ pull >/= 10# for functional strength/ mobility     Time  6    Period  Weeks    Status  New    Target Date  11/22/17      PT LONG TERM GOAL #4   Title  increase FOTO score to >/= 37% limited to demo improvement in function     Time  6    Period  Weeks    Status  New    Target Date  11/22/17      PT LONG TERM GOAL #5   Title  pt to be I with all HEP given as of last visit     Time  6    Period  Weeks    Status  New    Target Date  11/22/17            Plan - 10/22/17 1451    Clinical Impression Statement  Pt tolerated strengthening exercises well today, with some pain in wrist with theraband exercises but otherwise no pain in shoulder.  Progressing well with PT.    PT Treatment/Interventions  ADLs/Self Care Home Management;Cryotherapy;Electrical Stimulation;Iontophoresis 4mg /ml Dexamethasone;Moist Heat;Ultrasound;Patient/family education;Therapeutic activities;Therapeutic exercise;Manual techniques;Functional mobility training;Neuromuscular re-education;Passive range of motion;Dry needling;Taping    PT Next Visit Plan  if pt not too sore, add strengthening to HEP; progress ROM/strength as tolerated    Consulted and Agree with Plan of Care  Patient       Patient will benefit from skilled therapeutic intervention in order to improve the following deficits and impairments:  Pain, Improper body mechanics, Postural dysfunction, Decreased strength, Decreased activity tolerance, Decreased endurance, Decreased range of motion, Decreased mobility, Impaired UE functional use, Hypomobility  Visit Diagnosis: Chronic left shoulder pain  Muscle weakness (generalized)  Stiffness of left shoulder, not elsewhere classified     Problem List Patient Active Problem List   Diagnosis Date Noted  .  Renal insufficiency 10/01/2017  . Status post reverse total replacement of left shoulder 10/01/2017  . Fatty liver 10/01/2017  . Shoulder arthritis 09/03/2017  . Bilateral shoulder pain 05/24/2017  . History of CVA in adulthood 03/08/2017  . S/P total knee arthroplasty, left 01/30/2017  . Arthritis of knee 01/15/2017  . Skin rash 12/10/2016  . Medicare annual wellness visit, initial 10/05/2016  . Advanced care planning/counseling discussion 10/05/2016  . Diarrhea 08/21/2016  . Obesity, Class II, BMI 35-39.9, with comorbidity 07/19/2016  . Hypothyroidism   . IDA (iron deficiency anemia) 04/23/2016  . Chronic pain syndrome 04/23/2016  . Inflammatory arthritis 04/23/2016  . Chronic leg pain 04/23/2016  . Lesion of palate 04/23/2016  . Grieving 04/23/2016  . GERD (gastroesophageal reflux disease)   . Duodenitis determined by biopsy 02/13/2016  . Diverticulosis of colon without hemorrhage 01/31/2016  . Migraine 01/11/2012  . History of methicillin resistant staphylococcus aureus (MRSA) 07/04/2010  . DEGENERATIVE DISC DISEASE 07/04/2010  . History of total right knee replacement 07/04/2010  . Osteoarthritis 06/09/2007  .  Personal history of colonic adenoma 06/01/2003      Laureen Abrahams, PT, DPT 10/22/17 2:52 PM    [facility], [facility], [facility] [address] [place], [place], [address] Phone: [phone]   Fax:  [phone]  Name: Nicholas [name] MRN: [id number] Date of Birth: June 04, 1951

## 2017-10-24 ENCOUNTER — Encounter: Payer: Self-pay | Admitting: Physical Therapy

## 2017-10-24 ENCOUNTER — Ambulatory Visit: Payer: PPO | Admitting: Physical Therapy

## 2017-10-24 DIAGNOSIS — M6281 Muscle weakness (generalized): Secondary | ICD-10-CM

## 2017-10-24 DIAGNOSIS — M25512 Pain in left shoulder: Principal | ICD-10-CM

## 2017-10-24 DIAGNOSIS — G8929 Other chronic pain: Secondary | ICD-10-CM

## 2017-10-24 DIAGNOSIS — M25612 Stiffness of left shoulder, not elsewhere classified: Secondary | ICD-10-CM

## 2017-10-24 NOTE — Patient Instructions (Signed)
Flexion - Supine (Dumbbell)    Lie with arms along body. Lift left arm and shoulders straight up, palms forward. Repeat _10__ times per set. Do __1-2__ sets per session. Do __1-2__ sessions per day. Use __2__ lb weights; or soup can.  Scapular: Protraction - 90 of Flexion    Holding __2__ pound weights, attempt to push left arm up toward ceiling, keeping elbows straight and back against floor. Repeat __10__ times per set. Do __1-2__ sets per session. Do _1-2___ sessions per day.  Resisted Horizontal Abduction: Bilateral    Lying on your back, tubing in both hands, arms out in front. Keeping arms straight, pinch shoulder blades together and stretch arms out. Repeat __10__ times per set. Do __1-2__ sets per session. Do __1-2__ sessions per day.    Abduction - Side-Lying (Dumbbell)    Lie with neck supported, left arm on hip. Lift straight arm toward ceiling. Repeat __20__ times per set. Do __1-2__ sets per session. Do _1-2___ sessions per day. Use __2__ lb weight.  External Rotation: Side-Lying (Dumbbell)    Lie with neck supported, left elbow bent to 90, forearm across stomach. Raise forearm, keeping elbow at side. Repeat __10__ times per set. Do __1-2__ sets per session. Do _1-2___ sessions per day. Use __2__ lb weight.

## 2017-10-24 NOTE — Therapy (Signed)
[place], [place], [address] Phone: [phone]   Fax:  [phone]  Physical Therapy Treatment  Patient Details  Name: Nicholas [name] MRN: [id number] Date of Birth: 05/30/1951 Referring Provider: Tonna Corner dean MD   Encounter Date: 10/24/2017  PT End of Session - 10/24/17 1411    Visit Number  4    Number of Visits  13    Date for PT Re-Evaluation  11/22/17    Authorization Type  MCR: Kx mod by 15th visit, progress note by 10th visit    PT Start Time  1328    PT Stop Time  1410    PT Time Calculation (min)  42 min    Activity Tolerance  Patient tolerated treatment well    Behavior During Therapy  [facility] for tasks assessed/performed       Past Medical History:  Diagnosis Date  . Acute kidney failure 08/2008   "cleared up"  no problems since  . Allergy   . Anxiety   . Arthritis   . Asthma    ?of this no inhaler  . Blood transfusion   . CHF (congestive heart failure) ([place])    ?of this, pt denies  . CLOSTRIDIUM DIFFICILE COLITIS 07/04/2010   Annotation: 12/09, 2/10 Qualifier: Diagnosis of  By: [name], [name]    . Depression   . Depression with anxiety   . Duodenitis determined by biopsy 02/2016   peptic likely due to aleve (erosive gastropathy with duodenal erosions)  . ECZEMA 07/04/2010   Qualifier: Diagnosis of  By: [name], [name]    . Elevated liver enzymes   . GERD (gastroesophageal reflux disease)   . Heart attack ([place])    08/2008 (likely demand ischemia in the setting of MSSA sepsis/R TKA  infection)10-2008  . History of hiatal hernia   . History of stomach ulcers   . Hypothyroidism   . Lower GI bleeding   . MALAR AND MAXILLARY BONES CLOSED FRACTURE 07/04/2010   Annotation: ORIF Qualifier: Diagnosis of  By: [name], [name]    . Migraine    "definitely"  . MRSA (methicillin resistant Staphylococcus aureus)    in leg, had to place steel rod in leg  . Personal history of colonic  adenoma 06/01/2003  . PFO (patent foramen ovale)    small PFO by 08/2008 TEE  . Pneumonia 08/2008   "while in ICU"  . Seizures ([place]) 2009   "long time ago"    . Stroke [facility]) 2010   unable to complete sentences at times    Past Surgical History:  Procedure Laterality Date  . anterior  nerve transposition  07/2009   left ulnar nerve  . antibiotic spacer exchange  11/2008; 08/2006   right knee  . ARTHROTOMY  08/2008   right knee w/I&D  . CATARACT EXTRACTION    . COLONOSCOPY  11/2012   diverticulosis, rpt 10 yrs [name])  . ESOPHAGOGASTRODUODENOSCOPY  02/2016   erosive gastropathy with duodenal erosions [name])  . HARDWARE REMOVAL  04/2006   right knee w/antibiotic spacers placed  . INGUINAL HERNIA REPAIR  early 1990's   bilateral  . JOINT REPLACEMENT Right    rt knee  . KNEE ARTHROSCOPY  10/2001   right  . KNEE FUSION  03/2009   right knee removal; antibiotic spacers;   . REPLACEMENT TOTAL KNEE  08/2008; 09/2001   right  . REVERSE SHOULDER ARTHROPLASTY Left 09/03/2017   Procedure: REVERSE SHOULDER ARTHROPLASTY;  Surgeon: Meredith Pel, MD;  Location: [place];  Service: Orthopedics;  Laterality: Left;  . rod placement Right   . SYNOVECTOMY  06/2005   debridement, liner exchange right knee  . TOE AMPUTATION Left 08/2008   great toe  . TOE AMPUTATION Left 04/2016   2nd toe [name]  . TONSILLECTOMY    . TOTAL KNEE ARTHROPLASTY Left 01/15/2017  . TOTAL KNEE ARTHROPLASTY Left 01/15/2017   Procedure: TOTAL KNEE ARTHROPLASTY;  Surgeon: Meredith Pel, MD;  Location: [place];  Service: Orthopedics;  Laterality: Left;    There were no vitals filed for this visit.  Subjective Assessment - 10/24/17 1332    Subjective  shoulder was a little sore after last session, but resolved now and no pain    Patient Stated Goals  to be able to get my hand above my head    Currently in Pain?  No/denies    Pain Score  0                      [facility] Adult PT Treatment/Exercise  - 10/24/17 1332      Shoulder Exercises: Supine   Protraction  Strengthening;Left;Weights;20 reps    Protraction Weight (lbs)  2    Horizontal ABduction  Both;20 reps;Theraband    Theraband Level (Shoulder Horizontal ABduction)  Level 1 (Yellow)    Flexion  Strengthening;Left;20 reps;Weights    Shoulder Flexion Weight (lbs)  2      Shoulder Exercises: Sidelying   External Rotation  Both;20 reps;Weights    External Rotation Weight (lbs)  2    ABduction  Left;20 reps;Weights    ABduction Weight (lbs)  2      Shoulder Exercises: Standing   Protraction  Left;20 reps;Weights    Protraction Weight (lbs)  Free Motion: 3#    Internal Rotation  Left;20 reps;Weights    Internal Rotation Weight (lbs)  Free Motiion: 3#    Flexion  Left;Limitations;Weights;20 reps    Shoulder Flexion Weight (lbs)  2#    Flexion Limitations  counter to cabinet height    ABduction  Left;20 reps;Weights;Limitations    Shoulder ABduction Weight (lbs)  2#    ABduction Limitations  counter to cabinet height      Shoulder Exercises: Pulleys   Flexion  3 minutes    ABduction  3 minutes      Shoulder Exercises: ROM/Strengthening   Cybex Row Limitations  Free Motion: row and extension 7# bil; 2x10 each             PT Education - 10/24/17 1411    Education provided  Yes    Education Details  strengthening HEP    Person(s) Educated  Patient    Methods  Explanation;Demonstration;Handout    Comprehension  Verbalized understanding;Returned demonstration;Need further instruction       PT Short Term Goals - 10/11/17 1253      PT SHORT TERM GOAL #1   Title  pt to be I with inital HEP     Time  3    Period  Weeks    Status  New    Target Date  11/01/17      PT SHORT TERM GOAL #2   Title  pt to verbalize / demo proper posture with lifting/ carrying mechanics to prevent/ reduce shoulder/ wrist pain    Time  4    Period  Weeks    Status  New    Target Date  11/01/17  PT Long Term Goals -  10/11/17 1255      PT LONG TERM GOAL #1   Title  pt to increase shoulder flexion/ abduction by >/= 10 degreee and ER to >/= 30 degrees to promote functional mobility required for ADLs     Time  6    Period  Weeks    Status  New    Target Date  11/22/17      PT LONG TERM GOAL #2   Title  increase L shoulder strength to >/= 4/5 to assist with shoulder stability with liftin/carrying activities     Time  6    Period  Weeks    Status  New    Target Date  11/22/17      PT LONG TERM GOAL #3   Title  pt to be able to lift/lower >/= 8# to/from an overhead shelf and push/ pull >/= 10# for functional strength/ mobility     Time  6    Period  Weeks    Status  New    Target Date  11/22/17      PT LONG TERM GOAL #4   Title  increase FOTO score to >/= 37% limited to demo improvement in function     Time  6    Period  Weeks    Status  New    Target Date  11/22/17      PT LONG TERM GOAL #5   Title  pt to be I with all HEP given as of last visit     Time  6    Period  Weeks    Status  New    Target Date  11/22/17            Plan - 10/24/17 1412    Clinical Impression Statement  Pt reported mild soreness after last session so progressed HEP to include strengthening exercises.  Pt c/o muscle fatigue following session today.  Progressing well with PT and will benefit from continued PT to maximize function.    PT Treatment/Interventions  ADLs/Self Care Home Management;Cryotherapy;Electrical Stimulation;Iontophoresis 4mg /ml Dexamethasone;Moist Heat;Ultrasound;Patient/family education;Therapeutic activities;Therapeutic exercise;Manual techniques;Functional mobility training;Neuromuscular re-education;Passive range of motion;Dry needling;Taping    PT Next Visit Plan  review strengthening to HEP; progress ROM/strength as tolerated; look at STGs       Patient will benefit from skilled therapeutic intervention in order to improve the following deficits and impairments:  Pain, Improper body  mechanics, Postural dysfunction, Decreased strength, Decreased activity tolerance, Decreased endurance, Decreased range of motion, Decreased mobility, Impaired UE functional use, Hypomobility  Visit Diagnosis: Chronic left shoulder pain  Muscle weakness (generalized)  Stiffness of left shoulder, not elsewhere classified     Problem List Patient Active Problem List   Diagnosis Date Noted  . Renal insufficiency 10/01/2017  . Status post reverse total replacement of left shoulder 10/01/2017  . Fatty liver 10/01/2017  . Shoulder arthritis 09/03/2017  . Bilateral shoulder pain 05/24/2017  . History of CVA in adulthood 03/08/2017  . S/P total knee arthroplasty, left 01/30/2017  . Arthritis of knee 01/15/2017  . Skin rash 12/10/2016  . Medicare annual wellness visit, initial 10/05/2016  . Advanced care planning/counseling discussion 10/05/2016  . Diarrhea 08/21/2016  . Obesity, Class II, BMI 35-39.9, with comorbidity 07/19/2016  . Hypothyroidism   . IDA (iron deficiency anemia) 04/23/2016  . Chronic pain syndrome 04/23/2016  . Inflammatory arthritis 04/23/2016  . Chronic leg pain 04/23/2016  . Lesion of palate 04/23/2016  . Grieving  04/23/2016  . GERD (gastroesophageal reflux disease)   . Duodenitis determined by biopsy 02/13/2016  . Diverticulosis of colon without hemorrhage 01/31/2016  . Migraine 01/11/2012  . History of methicillin resistant staphylococcus aureus (MRSA) 07/04/2010  . DEGENERATIVE DISC DISEASE 07/04/2010  . History of total right knee replacement 07/04/2010  . Osteoarthritis 06/09/2007  . Personal history of colonic adenoma 06/01/2003      Laureen Abrahams, PT, DPT 10/24/17 2:14 PM     [facility] [address] [place], [place], [address] Phone: [phone]   Fax:  [phone]  Name: BURNICE OESTREICHER MRN: [id number] Date of Birth: 1951/10/02

## 2017-10-29 ENCOUNTER — Ambulatory Visit: Payer: PPO | Admitting: Physical Therapy

## 2017-10-29 ENCOUNTER — Encounter: Payer: Self-pay | Admitting: Physical Therapy

## 2017-10-29 DIAGNOSIS — G8929 Other chronic pain: Secondary | ICD-10-CM

## 2017-10-29 DIAGNOSIS — M25512 Pain in left shoulder: Secondary | ICD-10-CM | POA: Diagnosis not present

## 2017-10-29 DIAGNOSIS — M25612 Stiffness of left shoulder, not elsewhere classified: Secondary | ICD-10-CM

## 2017-10-29 DIAGNOSIS — M6281 Muscle weakness (generalized): Secondary | ICD-10-CM

## 2017-10-29 NOTE — Therapy (Signed)
[place], [place], [address] Phone: [phone]   Fax:  [phone]  Physical Therapy Treatment  Patient Details  Name: Nicholas [name] MRN: [id number] Date of Birth: 1951/09/05 Referring Provider: Tonna Corner dean MD   Encounter Date: 10/29/2017  PT End of Session - 10/29/17 1418    Visit Number  5    Number of Visits  13    Date for PT Re-Evaluation  11/22/17    Authorization Type  MCR: Kx mod by 15th visit, progress note by 10th visit    PT Start Time  1329    PT Stop Time  1409    PT Time Calculation (min)  40 min    Activity Tolerance  Patient tolerated treatment well    Behavior During Therapy  [facility] for tasks assessed/performed       Past Medical History:  Diagnosis Date  . Acute kidney failure 08/2008   "cleared up"  no problems since  . Allergy   . Anxiety   . Arthritis   . Asthma    ?of this no inhaler  . Blood transfusion   . CHF (congestive heart failure) ([place])    ?of this, pt denies  . CLOSTRIDIUM DIFFICILE COLITIS 07/04/2010   Annotation: 12/09, 2/10 Qualifier: Diagnosis of  By: [name], [name]    . Depression   . Depression with anxiety   . Duodenitis determined by biopsy 02/2016   peptic likely due to aleve (erosive gastropathy with duodenal erosions)  . ECZEMA 07/04/2010   Qualifier: Diagnosis of  By: [name], [name]    . Elevated liver enzymes   . GERD (gastroesophageal reflux disease)   . Heart attack ([place])    08/2008 (likely demand ischemia in the setting of MSSA sepsis/R TKA  infection)10-2008  . History of hiatal hernia   . History of stomach ulcers   . Hypothyroidism   . Lower GI bleeding   . MALAR AND MAXILLARY BONES CLOSED FRACTURE 07/04/2010   Annotation: ORIF Qualifier: Diagnosis of  By: [name], [name]    . Migraine    "definitely"  . MRSA (methicillin resistant Staphylococcus aureus)    in leg, had to place steel rod in leg  . Personal history of colonic  adenoma 06/01/2003  . PFO (patent foramen ovale)    small PFO by 08/2008 TEE  . Pneumonia 08/2008   "while in ICU"  . Seizures ([place]) 2009   "long time ago"    . Stroke [facility]) 2010   unable to complete sentences at times    Past Surgical History:  Procedure Laterality Date  . anterior  nerve transposition  07/2009   left ulnar nerve  . antibiotic spacer exchange  11/2008; 08/2006   right knee  . ARTHROTOMY  08/2008   right knee w/I&D  . CATARACT EXTRACTION    . COLONOSCOPY  11/2012   diverticulosis, rpt 10 yrs [name])  . ESOPHAGOGASTRODUODENOSCOPY  02/2016   erosive gastropathy with duodenal erosions [name])  . HARDWARE REMOVAL  04/2006   right knee w/antibiotic spacers placed  . INGUINAL HERNIA REPAIR  early 1990's   bilateral  . JOINT REPLACEMENT Right    rt knee  . KNEE ARTHROSCOPY  10/2001   right  . KNEE FUSION  03/2009   right knee removal; antibiotic spacers;   . REPLACEMENT TOTAL KNEE  08/2008; 09/2001   right  . REVERSE SHOULDER ARTHROPLASTY Left 09/03/2017   Procedure: REVERSE SHOULDER ARTHROPLASTY;  Surgeon: Meredith Pel, MD;  Location: [place];  Service: Orthopedics;  Laterality: Left;  . rod placement Right   . SYNOVECTOMY  06/2005   debridement, liner exchange right knee  . TOE AMPUTATION Left 08/2008   great toe  . TOE AMPUTATION Left 04/2016   2nd toe [name]  . TONSILLECTOMY    . TOTAL KNEE ARTHROPLASTY Left 01/15/2017  . TOTAL KNEE ARTHROPLASTY Left 01/15/2017   Procedure: TOTAL KNEE ARTHROPLASTY;  Surgeon: Meredith Pel, MD;  Location: [place];  Service: Orthopedics;  Laterality: Left;    There were no vitals filed for this visit.  Subjective Assessment - 10/29/17 1329    Subjective  shoulder is doing well; hand feels much better than before    Patient Stated Goals  to be able to get my hand above my head    Currently in Pain?  No/denies                      [facility] Adult PT Treatment/Exercise - 10/29/17 1333      Shoulder  Exercises: Prone   Retraction  Left;20 reps;Weights performed standing with forward flexion    Retraction Weight (lbs)  3    Extension  Left;20 reps;Weights performed standing with forward flexion    Extension Weight (lbs)  3    Horizontal ABduction 1  Left;20 reps;Weights performed standing with forward flexion    Horizontal ABduction 1 Weight (lbs)  2      Shoulder Exercises: Standing   Flexion  Left;Limitations;Weights;20 reps    Shoulder Flexion Weight (lbs)  2#    Flexion Limitations  counter to cabinet height    ABduction  Left;20 reps;Weights;Limitations    Shoulder ABduction Weight (lbs)  2#    ABduction Limitations  counter to cabinet height      Shoulder Exercises: Pulleys   Flexion  3 minutes      Shoulder Exercises: ROM/Strengthening   UBE (Upper Arm Bike)  L 3.0 x 6 min (3' fwd/3' bwd)    Wall Wash  2# wrist weight; flexion; horizontal abdct/addct; circles each direction x 20 reps each      Manual Therapy   Manual Therapy  Passive ROM    Passive ROM  Lt shoulder flex/abdct/er/ir with end range holds               PT Short Term Goals - 10/11/17 1253      PT SHORT TERM GOAL #1   Title  pt to be I with inital HEP     Time  3    Period  Weeks    Status  New    Target Date  11/01/17      PT SHORT TERM GOAL #2   Title  pt to verbalize / demo proper posture with lifting/ carrying mechanics to prevent/ reduce shoulder/ wrist pain    Time  4    Period  Weeks    Status  New    Target Date  11/01/17        PT Long Term Goals - 10/11/17 1255      PT LONG TERM GOAL #1   Title  pt to increase shoulder flexion/ abduction by >/= 10 degreee and ER to >/= 30 degrees to promote functional mobility required for ADLs     Time  6    Period  Weeks    Status  New    Target Date  11/22/17  PT LONG TERM GOAL #2   Title  increase L shoulder strength to >/= 4/5 to assist with shoulder stability with liftin/carrying activities     Time  6    Period  Weeks     Status  New    Target Date  11/22/17      PT LONG TERM GOAL #3   Title  pt to be able to lift/lower >/= 8# to/from an overhead shelf and push/ pull >/= 10# for functional strength/ mobility     Time  6    Period  Weeks    Status  New    Target Date  11/22/17      PT LONG TERM GOAL #4   Title  increase FOTO score to >/= 37% limited to demo improvement in function     Time  6    Period  Weeks    Status  New    Target Date  11/22/17      PT LONG TERM GOAL #5   Title  pt to be I with all HEP given as of last visit     Time  6    Period  Weeks    Status  New    Target Date  11/22/17            Plan - 10/29/17 1418    Clinical Impression Statement  Pt tolerated strengthening well today with expected reports of muscle fatigue which settles quickly.  Anticipate pt is nearing d/c as he reports no significant functional limitations with ADLs and household responsibilities.    PT Treatment/Interventions  ADLs/Self Care Home Management;Cryotherapy;Electrical Stimulation;Iontophoresis 4mg /ml Dexamethasone;Moist Heat;Ultrasound;Patient/family education;Therapeutic activities;Therapeutic exercise;Manual techniques;Functional mobility training;Neuromuscular re-education;Passive range of motion;Dry needling;Taping    PT Next Visit Plan   progress ROM/strength as tolerated; look at STGs    Consulted and Agree with Plan of Care  Patient       Patient will benefit from skilled therapeutic intervention in order to improve the following deficits and impairments:  Pain, Improper body mechanics, Postural dysfunction, Decreased strength, Decreased activity tolerance, Decreased endurance, Decreased range of motion, Decreased mobility, Impaired UE functional use, Hypomobility  Visit Diagnosis: Chronic left shoulder pain  Muscle weakness (generalized)  Stiffness of left shoulder, not elsewhere classified     Problem List Patient Active Problem List   Diagnosis Date Noted  . Renal  insufficiency 10/01/2017  . Status post reverse total replacement of left shoulder 10/01/2017  . Fatty liver 10/01/2017  . Shoulder arthritis 09/03/2017  . Bilateral shoulder pain 05/24/2017  . History of CVA in adulthood 03/08/2017  . S/P total knee arthroplasty, left 01/30/2017  . Arthritis of knee 01/15/2017  . Skin rash 12/10/2016  . Medicare annual wellness visit, initial 10/05/2016  . Advanced care planning/counseling discussion 10/05/2016  . Diarrhea 08/21/2016  . Obesity, Class II, BMI 35-39.9, with comorbidity 07/19/2016  . Hypothyroidism   . IDA (iron deficiency anemia) 04/23/2016  . Chronic pain syndrome 04/23/2016  . Inflammatory arthritis 04/23/2016  . Chronic leg pain 04/23/2016  . Lesion of palate 04/23/2016  . Grieving 04/23/2016  . GERD (gastroesophageal reflux disease)   . Duodenitis determined by biopsy 02/13/2016  . Diverticulosis of colon without hemorrhage 01/31/2016  . Migraine 01/11/2012  . History of methicillin resistant staphylococcus aureus (MRSA) 07/04/2010  . DEGENERATIVE DISC DISEASE 07/04/2010  . History of total right knee replacement 07/04/2010  . Osteoarthritis 06/09/2007  . Personal history of colonic adenoma 06/01/2003      [place]ta [place]  Staci Righter, PT, DPT 10/29/17 2:20 PM    [facility] [address] [place], [place], [address] Phone: [phone]   Fax:  [phone]  Name: Nicholas [name] MRN: [id number] Date of Birth: 29-Jun-1951

## 2017-10-30 ENCOUNTER — Other Ambulatory Visit: Payer: Self-pay | Admitting: Family Medicine

## 2017-10-30 NOTE — Telephone Encounter (Signed)
Last filled:  05/20/17, #12 Last OV:  10/01/17 Next OV:  11/18/17

## 2017-10-31 ENCOUNTER — Encounter: Payer: Self-pay | Admitting: Physical Therapy

## 2017-10-31 ENCOUNTER — Ambulatory Visit: Payer: PPO | Admitting: Physical Therapy

## 2017-10-31 DIAGNOSIS — M6281 Muscle weakness (generalized): Secondary | ICD-10-CM

## 2017-10-31 DIAGNOSIS — M25612 Stiffness of left shoulder, not elsewhere classified: Secondary | ICD-10-CM

## 2017-10-31 DIAGNOSIS — M25512 Pain in left shoulder: Principal | ICD-10-CM

## 2017-10-31 DIAGNOSIS — G8929 Other chronic pain: Secondary | ICD-10-CM

## 2017-10-31 NOTE — Therapy (Signed)
[place], [place], [address] Phone: [phone]   Fax:  [phone]  Physical Therapy Treatment  Patient Details  Name: Nicholas [name] MRN: [id number] Date of Birth: May 26, 1951 Referring Provider: Tonna Corner dean MD   Encounter Date: 10/31/2017  PT End of Session - 10/31/17 1405    Visit Number  6    Number of Visits  13    Date for PT Re-Evaluation  11/22/17    Authorization Type  MCR: Kx mod by 15th visit, progress note by 10th visit    PT Start Time  1325    PT Stop Time  1405    PT Time Calculation (min)  40 min    Activity Tolerance  Patient tolerated treatment well    Behavior During Therapy  [facility] for tasks assessed/performed       Past Medical History:  Diagnosis Date  . Acute kidney failure 08/2008   "cleared up"  no problems since  . Allergy   . Anxiety   . Arthritis   . Asthma    ?of this no inhaler  . Blood transfusion   . CHF (congestive heart failure) ([place])    ?of this, pt denies  . CLOSTRIDIUM DIFFICILE COLITIS 07/04/2010   Annotation: 12/09, 2/10 Qualifier: Diagnosis of  By: [name], [name]    . Depression   . Depression with anxiety   . Duodenitis determined by biopsy 02/2016   peptic likely due to aleve (erosive gastropathy with duodenal erosions)  . ECZEMA 07/04/2010   Qualifier: Diagnosis of  By: [name], [name]    . Elevated liver enzymes   . GERD (gastroesophageal reflux disease)   . Heart attack ([place])    08/2008 (likely demand ischemia in the setting of MSSA sepsis/R TKA  infection)10-2008  . History of hiatal hernia   . History of stomach ulcers   . Hypothyroidism   . Lower GI bleeding   . MALAR AND MAXILLARY BONES CLOSED FRACTURE 07/04/2010   Annotation: ORIF Qualifier: Diagnosis of  By: [name], [name]    . Migraine    "definitely"  . MRSA (methicillin resistant Staphylococcus aureus)    in leg, had to place steel rod in leg  . Personal history of colonic  adenoma 06/01/2003  . PFO (patent foramen ovale)    small PFO by 08/2008 TEE  . Pneumonia 08/2008   "while in ICU"  . Seizures ([place]) 2009   "long time ago"    . Stroke [facility]) 2010   unable to complete sentences at times    Past Surgical History:  Procedure Laterality Date  . anterior  nerve transposition  07/2009   left ulnar nerve  . antibiotic spacer exchange  11/2008; 08/2006   right knee  . ARTHROTOMY  08/2008   right knee w/I&D  . CATARACT EXTRACTION    . COLONOSCOPY  11/2012   diverticulosis, rpt 10 yrs [name])  . ESOPHAGOGASTRODUODENOSCOPY  02/2016   erosive gastropathy with duodenal erosions [name])  . HARDWARE REMOVAL  04/2006   right knee w/antibiotic spacers placed  . INGUINAL HERNIA REPAIR  early 1990's   bilateral  . JOINT REPLACEMENT Right    rt knee  . KNEE ARTHROSCOPY  10/2001   right  . KNEE FUSION  03/2009   right knee removal; antibiotic spacers;   . REPLACEMENT TOTAL KNEE  08/2008; 09/2001   right  . REVERSE SHOULDER ARTHROPLASTY Left 09/03/2017   Procedure: REVERSE SHOULDER ARTHROPLASTY;  Surgeon: Meredith Pel, MD;  Location: [place];  Service: Orthopedics;  Laterality: Left;  . rod placement Right   . SYNOVECTOMY  06/2005   debridement, liner exchange right knee  . TOE AMPUTATION Left 08/2008   great toe  . TOE AMPUTATION Left 04/2016   2nd toe [name]  . TONSILLECTOMY    . TOTAL KNEE ARTHROPLASTY Left 01/15/2017  . TOTAL KNEE ARTHROPLASTY Left 01/15/2017   Procedure: TOTAL KNEE ARTHROPLASTY;  Surgeon: Meredith Pel, MD;  Location: [place];  Service: Orthopedics;  Laterality: Left;    There were no vitals filed for this visit.  Subjective Assessment - 10/31/17 1328    Subjective  doing well; has been doing a lot of prep work as he anticipates he will be moving soon    Patient Stated Goals  to be able to get my hand above my head    Currently in Pain?  No/denies         [facility] PT Assessment - 10/31/17 1338      AROM   Left Shoulder  Flexion  131 Degrees 120 standing    Left Shoulder ABduction  116 Degrees 109 standing    Left Shoulder Internal Rotation  63 Degrees    Left Shoulder External Rotation  35 Degrees                  [facility] Adult PT Treatment/Exercise - 10/31/17 1329      Self-Care   Self-Care  Posture    Posture  reviewed posture and body mechanics with carrying and lifting      Shoulder Exercises: Supine   Protraction  Strengthening;Left;10 reps;Weights    Protraction Weight (lbs)  2    Horizontal ABduction  Both;10 reps;Theraband    Theraband Level (Shoulder Horizontal ABduction)  Level 2 (Red)    Flexion  Strengthening;Left;10 reps;Weights    Shoulder Flexion Weight (lbs)  2    Other Supine Exercises  supine cane: horizontal abdct/addct; flexion, abduction x 10 reps each      Shoulder Exercises: Sidelying   External Rotation  Weights;Left;10 reps    ABduction  Left;10 reps;Weights    ABduction Weight (lbs)  2      Shoulder Exercises: Standing   Other Standing Exercises  wall walks x 10      Shoulder Exercises: ROM/Strengthening   UBE (Upper Arm Bike)  L 3.0 x 6 min (3' fwd/3' bwd)      Shoulder Exercises: Isometric Strengthening   Flexion  5X5"    Extension  5X5"    External Rotation  5X5"    Internal Rotation  5X5"             PT Education - 10/31/17 1405    Education provided  Yes    Education Details  lifting/carrying mechanics    Person(s) Educated  Patient    Methods  Explanation;Demonstration    Comprehension  Verbalized understanding;Returned demonstration       PT Short Term Goals - 10/31/17 1405      PT SHORT TERM GOAL #1   Title  pt to be I with inital HEP     Status  Achieved      PT SHORT TERM GOAL #2   Title  pt to verbalize / demo proper posture with lifting/ carrying mechanics to prevent/ reduce shoulder/ wrist pain    Status  Achieved        PT Long Term Goals - 10/31/17 1406  PT LONG TERM GOAL #1   Title  pt to increase shoulder  flexion/ abduction by >/= 10 degreee and ER to >/= 30 degrees to promote functional mobility required for ADLs     Status  Achieved      PT LONG TERM GOAL #2   Title  increase L shoulder strength to >/= 4/5 to assist with shoulder stability with liftin/carrying activities     Status  On-going      PT LONG TERM GOAL #3   Title  pt to be able to lift/lower >/= 8# to/from an overhead shelf and push/ pull >/= 10# for functional strength/ mobility     Status  On-going      PT LONG TERM GOAL #4   Title  increase FOTO score to >/= 37% limited to demo improvement in function     Status  On-going      PT LONG TERM GOAL #5   Title  pt to be I with all HEP given as of last visit     Status  On-going            Plan - 10/31/17 1406    Clinical Impression Statement  Pt demonstrated significant improvement in ROM and function.  Anticipate pt will be ready for d/c next week.    PT Treatment/Interventions  ADLs/Self Care Home Management;Cryotherapy;Electrical Stimulation;Iontophoresis 4mg /ml Dexamethasone;Moist Heat;Ultrasound;Patient/family education;Therapeutic activities;Therapeutic exercise;Manual techniques;Functional mobility training;Neuromuscular re-education;Passive range of motion;Dry needling;Taping    PT Next Visit Plan   progress ROM/strength as tolerated; look at LTGs and consider d/c    Consulted and Agree with Plan of Care  Patient       Patient will benefit from skilled therapeutic intervention in order to improve the following deficits and impairments:  Pain, Improper body mechanics, Postural dysfunction, Decreased strength, Decreased activity tolerance, Decreased endurance, Decreased range of motion, Decreased mobility, Impaired UE functional use, Hypomobility  Visit Diagnosis: Chronic left shoulder pain  Muscle weakness (generalized)  Stiffness of left shoulder, not elsewhere classified     Problem List Patient Active Problem List   Diagnosis Date Noted  . Renal  insufficiency 10/01/2017  . Status post reverse total replacement of left shoulder 10/01/2017  . Fatty liver 10/01/2017  . Shoulder arthritis 09/03/2017  . Bilateral shoulder pain 05/24/2017  . History of CVA in adulthood 03/08/2017  . S/P total knee arthroplasty, left 01/30/2017  . Arthritis of knee 01/15/2017  . Skin rash 12/10/2016  . Medicare annual wellness visit, initial 10/05/2016  . Advanced care planning/counseling discussion 10/05/2016  . Diarrhea 08/21/2016  . Obesity, Class II, BMI 35-39.9, with comorbidity 07/19/2016  . Hypothyroidism   . IDA (iron deficiency anemia) 04/23/2016  . Chronic pain syndrome 04/23/2016  . Inflammatory arthritis 04/23/2016  . Chronic leg pain 04/23/2016  . Lesion of palate 04/23/2016  . Grieving 04/23/2016  . GERD (gastroesophageal reflux disease)   . Duodenitis determined by biopsy 02/13/2016  . Diverticulosis of colon without hemorrhage 01/31/2016  . Migraine 01/11/2012  . History of methicillin resistant staphylococcus aureus (MRSA) 07/04/2010  . DEGENERATIVE DISC DISEASE 07/04/2010  . History of total right knee replacement 07/04/2010  . Osteoarthritis 06/09/2007  . Personal history of colonic adenoma 06/01/2003      Laureen Abrahams, PT, DPT 10/31/17 2:08 PM    [place] [name] [address] [place], [place], [address] Phone: [phone]   Fax:  [phone]  Name: Nicholas [name] MRN: [id number] Date of Birth: 09/15/1951

## 2017-11-05 ENCOUNTER — Encounter: Payer: Self-pay | Admitting: Physical Therapy

## 2017-11-05 ENCOUNTER — Ambulatory Visit: Payer: PPO | Admitting: Physical Therapy

## 2017-11-05 DIAGNOSIS — M25512 Pain in left shoulder: Secondary | ICD-10-CM | POA: Diagnosis not present

## 2017-11-05 DIAGNOSIS — G8929 Other chronic pain: Secondary | ICD-10-CM

## 2017-11-05 DIAGNOSIS — M6281 Muscle weakness (generalized): Secondary | ICD-10-CM

## 2017-11-05 DIAGNOSIS — M25612 Stiffness of left shoulder, not elsewhere classified: Secondary | ICD-10-CM

## 2017-11-05 NOTE — Therapy (Signed)
S/P total knee arthroplasty, left 01/30/2017  . Arthritis of knee 01/15/2017  . Skin rash 12/10/2016  . Medicare annual wellness visit, initial 10/05/2016  . Advanced care planning/counseling discussion 10/05/2016  . Diarrhea 08/21/2016  . Obesity, Class II, BMI 35-39.9, with comorbidity 07/19/2016  . Hypothyroidism   . IDA (iron deficiency anemia) 04/23/2016  . Chronic pain syndrome 04/23/2016  . Inflammatory arthritis 04/23/2016  . Chronic leg pain 04/23/2016  . Lesion of palate 04/23/2016  . Grieving 04/23/2016  . GERD (gastroesophageal reflux disease)   . Duodenitis determined by biopsy 02/13/2016  . Diverticulosis of colon without hemorrhage 01/31/2016  . Migraine 01/11/2012  . History of methicillin resistant staphylococcus aureus (MRSA) 07/04/2010  . DEGENERATIVE DISC DISEASE 07/04/2010  . History of total right knee replacement 07/04/2010  . Osteoarthritis 06/09/2007  . Personal history of colonic adenoma 06/01/2003    Starr Lake PT, DPT, LAT, ATC  11/05/17  4:37 PM      [facility] [address] [place], [place], [address] Phone: [phone]   Fax:  [phone]  Name: Nicholas [name] MRN: [id number] Date of Birth: 09-11-1951      PHYSICAL THERAPY DISCHARGE SUMMARY  Visits from Start of Care: 7  Current functional level related to goals / functional outcomes: See goals, FOTO 11% limited   Remaining deficits: See above assessment   Education / Equipment: HEP, theraband, posture,   Plan: Patient agrees to discharge.  Patient goals were met. Patient is being discharged due to meeting the stated rehab goals.  ?????          Grayton Lobo PT, DPT, LAT, ATC  11/05/17  4:38 PM  [place], [place], [address] Phone: [phone]   Fax:  [phone]  Physical Therapy Treatment / Discharge Summary  Patient Details  Name: Nicholas [name] MRN: [id number] Date of Birth: 07-22-1951 Referring Provider: Tonna Corner dean MD   Encounter Date: 11/05/2017  PT End of Session - 11/05/17 1552    Visit Number  7    Number of Visits  13    Date for PT Re-Evaluation  11/22/17    Authorization Type  MCR: Kx mod by 15th visit, progress note by 10th visit    PT Start Time  1545    PT Stop Time  1615    PT Time Calculation (min)  30 min    Activity Tolerance  Patient tolerated treatment well    Behavior During Therapy  [facility] for tasks assessed/performed       Past Medical History:  Diagnosis Date  . Acute kidney failure 08/2008   "cleared up"  no problems since  . Allergy   . Anxiety   . Arthritis   . Asthma    ?of this no inhaler  . Blood transfusion   . CHF (congestive heart failure) ([place])    ?of this, pt denies  . CLOSTRIDIUM DIFFICILE COLITIS 07/04/2010   Annotation: 12/09, 2/10 Qualifier: Diagnosis of  By: [name], [name]    . Depression   . Depression with anxiety   . Duodenitis determined by biopsy 02/2016   peptic likely due to aleve (erosive gastropathy with duodenal erosions)  . ECZEMA 07/04/2010   Qualifier: Diagnosis of  By: [name], [name]    . Elevated liver enzymes   . GERD (gastroesophageal reflux disease)   . Heart attack ([place])    08/2008 (likely demand ischemia in the setting of MSSA sepsis/R TKA  infection)10-2008  . History of hiatal hernia   . History of stomach ulcers   . Hypothyroidism   . Lower GI bleeding   . MALAR AND MAXILLARY BONES CLOSED FRACTURE 07/04/2010   Annotation: ORIF Qualifier: Diagnosis of  By: [name], [name]    . Migraine    "definitely"  . MRSA (methicillin resistant Staphylococcus aureus)    in leg, had to place steel rod in leg  . Personal  history of colonic adenoma 06/01/2003  . PFO (patent foramen ovale)    small PFO by 08/2008 TEE  . Pneumonia 08/2008   "while in ICU"  . Seizures ([place]) 2009   "long time ago"    . Stroke [facility]) 2010   unable to complete sentences at times    Past Surgical History:  Procedure Laterality Date  . anterior  nerve transposition  07/2009   left ulnar nerve  . antibiotic spacer exchange  11/2008; 08/2006   right knee  . ARTHROTOMY  08/2008   right knee w/I&D  . CATARACT EXTRACTION    . COLONOSCOPY  11/2012   diverticulosis, rpt 10 yrs [name])  . ESOPHAGOGASTRODUODENOSCOPY  02/2016   erosive gastropathy with duodenal erosions [name])  . HARDWARE REMOVAL  04/2006   right knee w/antibiotic spacers placed  . INGUINAL HERNIA REPAIR  early 1990's   bilateral  . JOINT REPLACEMENT Right    rt knee  . KNEE ARTHROSCOPY  10/2001   right  . KNEE FUSION  03/2009   right knee removal; antibiotic spacers;   . REPLACEMENT TOTAL KNEE  08/2008; 09/2001   right  . REVERSE SHOULDER ARTHROPLASTY Left 09/03/2017   Procedure: REVERSE  [place], [place], [address] Phone: [phone]   Fax:  [phone]  Physical Therapy Treatment / Discharge Summary  Patient Details  Name: Nicholas [name] MRN: [id number] Date of Birth: 07-22-1951 Referring Provider: Tonna Corner dean MD   Encounter Date: 11/05/2017  PT End of Session - 11/05/17 1552    Visit Number  7    Number of Visits  13    Date for PT Re-Evaluation  11/22/17    Authorization Type  MCR: Kx mod by 15th visit, progress note by 10th visit    PT Start Time  1545    PT Stop Time  1615    PT Time Calculation (min)  30 min    Activity Tolerance  Patient tolerated treatment well    Behavior During Therapy  [facility] for tasks assessed/performed       Past Medical History:  Diagnosis Date  . Acute kidney failure 08/2008   "cleared up"  no problems since  . Allergy   . Anxiety   . Arthritis   . Asthma    ?of this no inhaler  . Blood transfusion   . CHF (congestive heart failure) ([place])    ?of this, pt denies  . CLOSTRIDIUM DIFFICILE COLITIS 07/04/2010   Annotation: 12/09, 2/10 Qualifier: Diagnosis of  By: [name], [name]    . Depression   . Depression with anxiety   . Duodenitis determined by biopsy 02/2016   peptic likely due to aleve (erosive gastropathy with duodenal erosions)  . ECZEMA 07/04/2010   Qualifier: Diagnosis of  By: [name], [name]    . Elevated liver enzymes   . GERD (gastroesophageal reflux disease)   . Heart attack ([place])    08/2008 (likely demand ischemia in the setting of MSSA sepsis/R TKA  infection)10-2008  . History of hiatal hernia   . History of stomach ulcers   . Hypothyroidism   . Lower GI bleeding   . MALAR AND MAXILLARY BONES CLOSED FRACTURE 07/04/2010   Annotation: ORIF Qualifier: Diagnosis of  By: [name], [name]    . Migraine    "definitely"  . MRSA (methicillin resistant Staphylococcus aureus)    in leg, had to place steel rod in leg  . Personal  history of colonic adenoma 06/01/2003  . PFO (patent foramen ovale)    small PFO by 08/2008 TEE  . Pneumonia 08/2008   "while in ICU"  . Seizures ([place]) 2009   "long time ago"    . Stroke [facility]) 2010   unable to complete sentences at times    Past Surgical History:  Procedure Laterality Date  . anterior  nerve transposition  07/2009   left ulnar nerve  . antibiotic spacer exchange  11/2008; 08/2006   right knee  . ARTHROTOMY  08/2008   right knee w/I&D  . CATARACT EXTRACTION    . COLONOSCOPY  11/2012   diverticulosis, rpt 10 yrs [name])  . ESOPHAGOGASTRODUODENOSCOPY  02/2016   erosive gastropathy with duodenal erosions [name])  . HARDWARE REMOVAL  04/2006   right knee w/antibiotic spacers placed  . INGUINAL HERNIA REPAIR  early 1990's   bilateral  . JOINT REPLACEMENT Right    rt knee  . KNEE ARTHROSCOPY  10/2001   right  . KNEE FUSION  03/2009   right knee removal; antibiotic spacers;   . REPLACEMENT TOTAL KNEE  08/2008; 09/2001   right  . REVERSE SHOULDER ARTHROPLASTY Left 09/03/2017   Procedure: REVERSE  S/P total knee arthroplasty, left 01/30/2017  . Arthritis of knee 01/15/2017  . Skin rash 12/10/2016  . Medicare annual wellness visit, initial 10/05/2016  . Advanced care planning/counseling discussion 10/05/2016  . Diarrhea 08/21/2016  . Obesity, Class II, BMI 35-39.9, with comorbidity 07/19/2016  . Hypothyroidism   . IDA (iron deficiency anemia) 04/23/2016  . Chronic pain syndrome 04/23/2016  . Inflammatory arthritis 04/23/2016  . Chronic leg pain 04/23/2016  . Lesion of palate 04/23/2016  . Grieving 04/23/2016  . GERD (gastroesophageal reflux disease)   . Duodenitis determined by biopsy 02/13/2016  . Diverticulosis of colon without hemorrhage 01/31/2016  . Migraine 01/11/2012  . History of methicillin resistant staphylococcus aureus (MRSA) 07/04/2010  . DEGENERATIVE DISC DISEASE 07/04/2010  . History of total right knee replacement 07/04/2010  . Osteoarthritis 06/09/2007  . Personal history of colonic adenoma 06/01/2003    Starr Lake PT, DPT, LAT, ATC  11/05/17  4:37 PM      [facility] [address] [place], [place], [address] Phone: [phone]   Fax:  [phone]  Name: Nicholas [name] MRN: [id number] Date of Birth: 09-11-1951      PHYSICAL THERAPY DISCHARGE SUMMARY  Visits from Start of Care: 7  Current functional level related to goals / functional outcomes: See goals, FOTO 11% limited   Remaining deficits: See above assessment   Education / Equipment: HEP, theraband, posture,   Plan: Patient agrees to discharge.  Patient goals were met. Patient is being discharged due to meeting the stated rehab goals.  ?????          Grayton Lobo PT, DPT, LAT, ATC  11/05/17  4:38 PM

## 2017-11-07 ENCOUNTER — Encounter: Payer: Self-pay | Admitting: Physical Therapy

## 2017-11-10 ENCOUNTER — Other Ambulatory Visit: Payer: Self-pay | Admitting: Family Medicine

## 2017-11-12 ENCOUNTER — Encounter: Payer: Self-pay | Admitting: Physical Therapy

## 2017-11-14 ENCOUNTER — Encounter: Payer: Self-pay | Admitting: Physical Therapy

## 2017-11-14 ENCOUNTER — Other Ambulatory Visit: Payer: Self-pay | Admitting: Family Medicine

## 2017-11-14 DIAGNOSIS — E039 Hypothyroidism, unspecified: Secondary | ICD-10-CM

## 2017-11-14 DIAGNOSIS — K760 Fatty (change of) liver, not elsewhere classified: Secondary | ICD-10-CM

## 2017-11-14 DIAGNOSIS — N289 Disorder of kidney and ureter, unspecified: Secondary | ICD-10-CM

## 2017-11-14 DIAGNOSIS — D509 Iron deficiency anemia, unspecified: Secondary | ICD-10-CM

## 2017-11-15 ENCOUNTER — Other Ambulatory Visit: Payer: Self-pay | Admitting: Family Medicine

## 2017-11-15 DIAGNOSIS — D509 Iron deficiency anemia, unspecified: Secondary | ICD-10-CM

## 2017-11-18 ENCOUNTER — Ambulatory Visit (INDEPENDENT_AMBULATORY_CARE_PROVIDER_SITE_OTHER): Payer: PPO

## 2017-11-18 ENCOUNTER — Telehealth: Payer: Self-pay

## 2017-11-18 VITALS — BP 124/82 | HR 76 | Temp 98.2°F | Ht 69.25 in | Wt 264.8 lb

## 2017-11-18 DIAGNOSIS — D509 Iron deficiency anemia, unspecified: Secondary | ICD-10-CM | POA: Diagnosis not present

## 2017-11-18 DIAGNOSIS — E039 Hypothyroidism, unspecified: Secondary | ICD-10-CM | POA: Diagnosis not present

## 2017-11-18 DIAGNOSIS — Z0000 Encounter for general adult medical examination without abnormal findings: Secondary | ICD-10-CM | POA: Diagnosis not present

## 2017-11-18 LAB — IBC PANEL
Iron: 40 ug/dL — ABNORMAL LOW (ref 42–165)
Saturation Ratios: 10.5 % — ABNORMAL LOW (ref 20.0–50.0)
Transferrin: 273 mg/dL (ref 212.0–360.0)

## 2017-11-18 LAB — TSH: TSH: 8.58 u[IU]/mL — AB (ref 0.35–4.50)

## 2017-11-18 LAB — FERRITIN: Ferritin: 21.9 ng/mL — ABNORMAL LOW (ref 22.0–322.0)

## 2017-11-18 LAB — T4, FREE: Free T4: 0.89 ng/dL (ref 0.60–1.60)

## 2017-11-18 NOTE — Telephone Encounter (Signed)
Patient in today for AWV. Patient states he has not been given full prescription of Imitrex. States pharmacy has only given him 5 pills and then 12 pills at a time. Currently has 9 pills on hand.   Note: current prescription is expired.   [facility] on [address] is pharmacy of choice.

## 2017-11-18 NOTE — Telephone Encounter (Signed)
imitrex is as needed medication for migraines. Should not be taking more than 12 per month otherwise we need to talk about better migraine management. plz ensure patient is taking appropriately - PRN migraine headache and see how many imitrex he is needing per month.

## 2017-11-18 NOTE — Patient Instructions (Addendum)
Nicholas [name] , Thank you for taking time to come for your Medicare Wellness Visit. I appreciate your ongoing commitment to your health goals. Please review the following plan we discussed and let me know if I can assist you in the future.   These are the goals we discussed: Goals    . DIET - EAT MORE FRUITS AND VEGETABLES     Starting 11/18/2017, I will continue to eat at least 4 servings of fresh fruits and vegetables daily.        This is a list of the screening recommended for you and due dates:  Health Maintenance  Topic Date Due  . Pneumonia vaccines (2 of 2 - PPSV23) 11/25/2017*  . DTaP/Tdap/Td vaccine (1 - Tdap) 11/18/2018*  . Tetanus Vaccine  11/18/2018*  . Colon Cancer Screening  12/11/2022  . Flu Shot  Completed  .  Hepatitis C: One time screening is recommended by Center for Disease Control  (CDC) for  adults born from 50 through 1965.   Completed  *Topic was postponed. The date shown is not the original due date.   Preventive Care for Adults  A healthy lifestyle and preventive care can promote health and wellness. Preventive health guidelines for adults include the following key practices.  . A routine yearly physical is a good way to check with your health care provider about your health and preventive screening. It is a chance to share any concerns and updates on your health and to receive a thorough exam.  . Visit your dentist for a routine exam and preventive care every 6 months. Brush your teeth twice a day and floss once a day. Good oral hygiene prevents tooth decay and gum disease.  . The frequency of eye exams is based on your age, health, family medical history, use  of contact lenses, and other factors. Follow your health care provider's recommendations for frequency of eye exams.  . Eat a healthy diet. Foods like vegetables, fruits, whole grains, low-fat dairy products, and lean protein foods contain the nutrients you need without too many calories. Decrease your  intake of foods high in solid fats, added sugars, and salt. Eat the right amount of calories for you. Get information about a proper diet from your health care provider, if necessary.  . Regular physical exercise is one of the most important things you can do for your health. Most adults should get at least 150 minutes of moderate-intensity exercise (any activity that increases your heart rate and causes you to sweat) each week. In addition, most adults need muscle-strengthening exercises on 2 or more days a week.  Silver Sneakers may be a benefit available to you. To determine eligibility, you may visit the website: www.silversneakers.com or contact program at 620-634-7850 Mon-Fri between 8AM-8PM.   . Maintain a healthy weight. The body mass index (BMI) is a screening tool to identify possible weight problems. It provides an estimate of body fat based on height and weight. Your health care provider can find your BMI and can help you achieve or maintain a healthy weight.   For adults 20 years and older: ? A BMI below 18.5 is considered underweight. ? A BMI of 18.5 to 24.9 is normal. ? A BMI of 25 to 29.9 is considered overweight. ? A BMI of 30 and above is considered obese.   . Maintain normal blood lipids and cholesterol levels by exercising and minimizing your intake of saturated fat. Eat a balanced diet with plenty of fruit and  vegetables. Blood tests for lipids and cholesterol should begin at age 38 and be repeated every 5 years. If your lipid or cholesterol levels are high, you are over 50, or you are at high risk for heart disease, you may need your cholesterol levels checked more frequently. Ongoing high lipid and cholesterol levels should be treated with medicines if diet and exercise are not working.  . If you smoke, find out from your health care provider how to quit. If you do not use tobacco, please do not start.  . If you choose to drink alcohol, please do not consume more than 2  drinks per day. One drink is considered to be 12 ounces (355 mL) of beer, 5 ounces (148 mL) of wine, or 1.5 ounces (44 mL) of liquor.  . If you are 14-46 years old, ask your health care provider if you should take aspirin to prevent strokes.  . Use sunscreen. Apply sunscreen liberally and repeatedly throughout the day. You should seek shade when your shadow is shorter than you. Protect yourself by wearing long sleeves, pants, a wide-brimmed hat, and sunglasses year round, whenever you are outdoors.  . Once a month, do a whole body skin exam, using a mirror to look at the skin on your back. Tell your health care provider of new moles, moles that have irregular borders, moles that are larger than a pencil eraser, or moles that have changed in shape or color.

## 2017-11-18 NOTE — Progress Notes (Signed)
Pre visit review using our clinic review tool, if applicable. No additional management support is needed unless otherwise documented below in the visit note. 

## 2017-11-18 NOTE — Telephone Encounter (Signed)
Spoke with pt relaying message per Dr. Darnell Level. Pt verbalizes understanding.  Says he has OV next week so will talk about it then.

## 2017-11-18 NOTE — Progress Notes (Signed)
Subjective:   Nicholas [name] is a 67 y.o. male who presents for Medicare Annual/Subsequent preventive examination.  Review of Systems:  N/A Cardiac Risk Factors include: advanced age (>64men, >28 women);male gender;obesity (BMI >30kg/m2)     Objective:    Vitals: BP 124/82 (BP Location: Right Arm, Patient Position: Sitting, Cuff Size: Normal)   Pulse 76   Temp 98.2 F (36.8 C) (Oral)   Ht 5' 9.25" (1.759 m) Comment: no shoes  Wt 264 lb 12 oz (120.1 kg)   SpO2 92%   BMI 38.81 kg/m   Body mass index is 38.81 kg/m.  Advanced Directives 11/18/2017 10/11/2017 09/04/2017 09/03/2017 08/21/2017 02/14/2017 01/15/2017  Does Patient Have a Medical Advance Directive? No No No No No No No  Copy of Healthcare Power of Attorney in Chart? - - - - - - -  Would patient like information on creating a medical advance directive? No - Patient declined No - Patient declined No - Patient declined No - Patient declined - - No - Patient declined    Tobacco Social History   Tobacco Use  Smoking Status Never Smoker  Smokeless Tobacco Never Used     Counseling given: No   Clinical Intake:  Pre-visit preparation completed: Yes  Pain : 0-10 Pain Score: 3  Pain Type: Chronic pain Pain Location: Head        How often do you need to have someone help you when you read instructions, pamphlets, or other written materials from your doctor or pharmacy?: 1 - Never What is the last grade level you completed in school?: 12th grade  Interpreter Needed?: No  Comments: pt lives alone Information entered by :: LPinson, LPN  Past Medical History:  Diagnosis Date  . Acute kidney failure 08/2008   "cleared up"  no problems since  . Allergy   . Anxiety   . Arthritis   . Asthma    ?of this no inhaler  . Blood transfusion   . CHF (congestive heart failure) ([place])    ?of this, pt denies  . CLOSTRIDIUM DIFFICILE COLITIS 07/04/2010   Annotation: 12/09, 2/10 Qualifier: Diagnosis of  By: [name], [name]     . Depression   . Depression with anxiety   . Duodenitis determined by biopsy 02/2016   peptic likely due to aleve (erosive gastropathy with duodenal erosions)  . ECZEMA 07/04/2010   Qualifier: Diagnosis of  By: [name], [name]    . Elevated liver enzymes   . GERD (gastroesophageal reflux disease)   . Heart attack ([place])    08/2008 (likely demand ischemia in the setting of MSSA sepsis/R TKA  infection)10-2008  . History of hiatal hernia   . History of stomach ulcers   . Hypothyroidism   . Lower GI bleeding   . MALAR AND MAXILLARY BONES CLOSED FRACTURE 07/04/2010   Annotation: ORIF Qualifier: Diagnosis of  By: [name], [name]    . Migraine    "definitely"  . MRSA (methicillin resistant Staphylococcus aureus)    in leg, had to place steel rod in leg  . Personal history of colonic adenoma 06/01/2003  . PFO (patent foramen ovale)    small PFO by 08/2008 TEE  . Pneumonia 08/2008   "while in ICU"  . Seizures ([place]) 2009   "long time ago"    . Stroke [facility]) 2010   unable to complete sentences at times   Past Surgical History:  Procedure Laterality Date  . anterior  nerve transposition  07/2009  left ulnar nerve  . antibiotic spacer exchange  11/2008; 08/2006   right knee  . ARTHROTOMY  08/2008   right knee w/I&D  . CATARACT EXTRACTION    . COLONOSCOPY  11/2012   diverticulosis, rpt 10 yrs [name])  . ESOPHAGOGASTRODUODENOSCOPY  02/2016   erosive gastropathy with duodenal erosions [name])  . HARDWARE REMOVAL  04/2006   right knee w/antibiotic spacers placed  . INGUINAL HERNIA REPAIR  early 1990's   bilateral  . JOINT REPLACEMENT Right    rt knee  . KNEE ARTHROSCOPY  10/2001   right  . KNEE FUSION  03/2009   right knee removal; antibiotic spacers;   . REPLACEMENT TOTAL KNEE  08/2008; 09/2001   right  . REVERSE SHOULDER ARTHROPLASTY Left 09/03/2017   Procedure: REVERSE SHOULDER ARTHROPLASTY;  Surgeon: Meredith Pel, MD;  Location: [place];  Service: Orthopedics;   Laterality: Left;  . rod placement Right   . SYNOVECTOMY  06/2005   debridement, liner exchange right knee  . TOE AMPUTATION Left 08/2008   great toe  . TOE AMPUTATION Left 04/2016   2nd toe [name]  . TONSILLECTOMY    . TOTAL KNEE ARTHROPLASTY Left 01/15/2017  . TOTAL KNEE ARTHROPLASTY Left 01/15/2017   Procedure: TOTAL KNEE ARTHROPLASTY;  Surgeon: Meredith Pel, MD;  Location: [place];  Service: Orthopedics;  Laterality: Left;   Family History  Problem Relation Age of Onset  . Coronary artery disease Father   . Heart disease Mother   . Breast cancer Sister   . Diabetes Brother   . Diabetes Sister   . Colon cancer Neg Hx   . Stomach cancer Neg Hx    Social History   Socioeconomic History  . Marital status: Widowed    Spouse name: None  . Number of children: 2  . Years of education: 107  . Highest education level: None  Social Needs  . Financial resource strain: None  . Food insecurity - worry: None  . Food insecurity - inability: None  . Transportation needs - medical: None  . Transportation needs - non-medical: None  Occupational History  . Occupation: retired  Tobacco Use  . Smoking status: Never Smoker  . Smokeless tobacco: Never Used  Substance and Sexual Activity  . Alcohol use: No    Alcohol/week: 0.0 oz    Comment: "I abused alcohol; last drink  ~ 2005"  . Drug use: No  . Sexual activity: No  Other Topics Concern  . None  Social History Narrative   Widower 2016 - wife passed from ESRD related illness   Mormon   Occ: landscaping   Edu: HS    Outpatient Encounter Medications as of 11/18/2017  Medication Sig  . amitriptyline (ELAVIL) 25 MG tablet TAKE 3 TABLETS BY MOUTH AT BEDTIME  . EPINEPHrine (EPIPEN 2-PAK) 0.3 mg/0.3 mL IJ SOAJ injection Inject 0.3 mg once into the muscle.   . gabapentin (NEURONTIN) 300 MG capsule TAKE ONE CAPSULE BY MOUTH 4 TIMES A DAY  . HYDROcodone-acetaminophen (NORCO) 10-325 MG tablet Take 1 tablet by mouth every 4 (four) hours  as needed for moderate pain ((score 4 to 6)).  . hydrocortisone 2.5 % cream Apply 1 application topically at bedtime as needed (for rash).   . Ibuprofen 200 MG CAPS Take 2 capsules by mouth at bedtime.  [name] levothyroxine (SYNTHROID, LEVOTHROID) 50 MCG tablet Take 1 tablet (50 mcg total) by mouth daily before breakfast.  . pantoprazole (PROTONIX) 40 MG tablet TAKE 1 TABLET (  40 MG TOTAL) BY MOUTH DAILY BEFORE BREAKFAST.  . Multiple Vitamins-Minerals (CENTRUM SILVER PO) Take 1 tablet daily by mouth.  . Probiotic Product (PROBIOTIC PO) Take 1 capsule daily by mouth.  . SUMAtriptan (IMITREX) 50 MG tablet TAKE 1 TABLET (50 MG TOTAL) BY MOUTH ONCE. MAY REPEAT IN 2 HOURS IF HEADACHE PERSISTS OR RECURS.  . [DISCONTINUED] acetaminophen (TYLENOL) 500 MG tablet Take 1,000 mg every 6 (six) hours as needed by mouth for moderate pain or headache.  . [DISCONTINUED] amitriptyline (ELAVIL) 25 MG tablet Take 75 mg at bedtime by mouth.   . [DISCONTINUED] SUMAtriptan (IMITREX) 50 MG tablet TAKE 1 TABLET (50 MG TOTAL) BY MOUTH ONCE. MAY REPEAT IN 2 HOURS IF HEADACHE PERSISTS OR RECURS.   No facility-administered encounter medications on file as of 11/18/2017.     Activities of Daily Living In your present state of health, do you have any difficulty performing the following activities: 11/18/2017 09/04/2017  Hearing? N N  Vision? N N  Difficulty concentrating or making decisions? N N  Walking or climbing stairs? Y Y  Dressing or bathing? N N  Doing errands, shopping? N N  Preparing Food and eating ? N -  Using the Toilet? N -  In the past six months, have you accidently leaked urine? Y -  Do you have problems with loss of bowel control? N -  Managing your Medications? N -  Managing your Finances? N -  Housekeeping or managing your Housekeeping? N -  Some recent data might be hidden    Patient Care Team: [name], MD as PCP - General (Family Medicine) Adrian Prows, MD (Cardiology)   Assessment:   This  is a routine wellness examination for Horace.   Hearing Screening   125Hz  250Hz  500Hz  1000Hz  2000Hz  3000Hz  4000Hz  6000Hz  8000Hz   Right ear:   40 40 40  40    Left ear:   40 40 40  0      Visual Acuity Screening   Right eye Left eye Both eyes  Without correction:     With correction: 20/25 20/25 20/25     Exercise Activities and Dietary recommendations Current Exercise Habits: The patient does not participate in regular exercise at present, Exercise limited by: orthopedic condition(s)  Goals    . DIET - EAT MORE FRUITS AND VEGETABLES     Starting 11/18/2017, I will continue to eat at least 4 servings of fresh fruits and vegetables daily.        Fall Risk Fall Risk  11/18/2017 10/05/2016 02/08/2016  Falls in the past year? Yes No No  Comment lost balance and fell - -  Number falls in past yr: 1 - -  Injury with Fall? No - -   Depression Screen PHQ 2/9 Scores 11/18/2017 10/05/2016 02/08/2016  PHQ - 2 Score 0 3 0  PHQ- 9 Score 0 8 -    Cognitive Function MMSE - Mini Mental State Exam 11/18/2017  Orientation to time 5  Orientation to Place 5  Registration 3  Attention/ Calculation 0  Recall 2  Recall-comments unable to recall 1 of 3 words  Language- name 2 objects 0  Language- repeat 1  Language- follow 3 step command 3  Language- read & follow direction 0  Write a sentence 0  Copy design 0  Total score 19     PLEASE NOTE: A Mini-Cog screen was completed. Maximum score is 20. A value of 0 denotes this part of Folstein MMSE was not completed  or the patient failed this part of the Mini-Cog screening.   Mini-Cog Screening Orientation to Time - Max 5 pts Orientation to Place - Max 5 pts Registration - Max 3 pts Recall - Max 3 pts Language Repeat - Max 1 pts Language Follow 3 Step Command - Max 3 pts     Immunization History  Administered Date(s) Administered  . Influenza, High Dose Seasonal PF 08/15/2017  . Influenza,inj,Quad PF,6+ Mos 07/01/2013  .  Influenza-Unspecified 08/16/2011, 07/18/2016  . Pneumococcal Conjugate-13 10/05/2016  . Pneumococcal Polysaccharide-23 08/16/2011  . Td 01/30/2006  . Zoster 10/15/2012    Screening Tests Health Maintenance  Topic Date Due  . PNA vac Low Risk Adult (2 of 2 - PPSV23) 11/25/2017 (Originally 10/05/2017)  . DTaP/Tdap/Td (1 - Tdap) 11/18/2018 (Originally 01/31/2006)  . TETANUS/TDAP  11/18/2018 (Originally 01/31/2016)  . COLONOSCOPY  12/11/2022  . INFLUENZA VACCINE  Completed  . Hepatitis C Screening  Completed     Plan:   I have personally reviewed, addressed, and noted the following in the patient's chart:  A. Medical and social history B. Use of alcohol, tobacco or illicit drugs  C. Current medications and supplements D. Functional ability and status E.  Nutritional status F.  Physical activity G. Advance directives H. List of other physicians I.  Hospitalizations, surgeries, and ER visits in previous 12 months J.  [place] to include hearing, vision, cognitive, depression L. Referrals and appointments - none  In addition, I have reviewed and discussed with patient certain preventive protocols, quality metrics, and best practice recommendations. A written personalized care plan for preventive services as well as general preventive health recommendations were provided to patient.  See attached scanned questionnaire for additional information.   Signed,   Lindell Noe, MHA, BS, LPN Health Coach

## 2017-11-18 NOTE — Progress Notes (Signed)
PCP notes:   Health maintenance:  PPSV23 - PCP please address at next appt Tetanus vaccine - postponed/insurance  Abnormal screenings:    Fall risk -  Fall Risk  11/18/2017 10/05/2016 02/08/2016  Falls in the past year? Yes No No  Comment lost balance and fell - -  Number falls in past yr: 1 - -  Injury with Fall? No - -   Mini-Cog score: 19/20 MMSE - Mini Mental State Exam 11/18/2017  Orientation to time 5  Orientation to Place 5  Registration 3  Attention/ Calculation 0  Recall 2  Recall-comments unable to recall 1 of 3 words  Language- name 2 objects 0  Language- repeat 1  Language- follow 3 step command 3  Language- read & follow direction 0  Write a sentence 0  Copy design 0  Total score 19      Hearing - failed  Hearing Screening   125Hz  250Hz  500Hz  1000Hz  2000Hz  3000Hz  4000Hz  6000Hz  8000Hz   Right ear:   40 40 40  40    Left ear:   40 40 40  0      Patient concerns:   Rash to face, groin, and rectal region. PCP please address at next appt.  Nurse concerns:  None  Next PCP appt:   11/25/17 @ 1130

## 2017-11-21 NOTE — Progress Notes (Signed)
I reviewed health advisor's note, was available for consultation, and agree with documentation and plan.  

## 2017-11-25 ENCOUNTER — Encounter: Payer: Self-pay | Admitting: Family Medicine

## 2017-11-25 ENCOUNTER — Ambulatory Visit (INDEPENDENT_AMBULATORY_CARE_PROVIDER_SITE_OTHER): Payer: PPO | Admitting: Family Medicine

## 2017-11-25 VITALS — BP 136/84 | HR 76 | Temp 98.4°F | Ht 69.0 in | Wt 263.0 lb

## 2017-11-25 DIAGNOSIS — N289 Disorder of kidney and ureter, unspecified: Secondary | ICD-10-CM | POA: Diagnosis not present

## 2017-11-25 DIAGNOSIS — M25511 Pain in right shoulder: Secondary | ICD-10-CM

## 2017-11-25 DIAGNOSIS — Z0001 Encounter for general adult medical examination with abnormal findings: Secondary | ICD-10-CM

## 2017-11-25 DIAGNOSIS — Z7189 Other specified counseling: Secondary | ICD-10-CM

## 2017-11-25 DIAGNOSIS — M25512 Pain in left shoulder: Secondary | ICD-10-CM | POA: Diagnosis not present

## 2017-11-25 DIAGNOSIS — G8929 Other chronic pain: Secondary | ICD-10-CM

## 2017-11-25 DIAGNOSIS — D509 Iron deficiency anemia, unspecified: Secondary | ICD-10-CM | POA: Diagnosis not present

## 2017-11-25 DIAGNOSIS — G43109 Migraine with aura, not intractable, without status migrainosus: Secondary | ICD-10-CM

## 2017-11-25 DIAGNOSIS — Z8673 Personal history of transient ischemic attack (TIA), and cerebral infarction without residual deficits: Secondary | ICD-10-CM | POA: Diagnosis not present

## 2017-11-25 DIAGNOSIS — Z0000 Encounter for general adult medical examination without abnormal findings: Secondary | ICD-10-CM | POA: Insufficient documentation

## 2017-11-25 DIAGNOSIS — E039 Hypothyroidism, unspecified: Secondary | ICD-10-CM

## 2017-11-25 MED ORDER — EPINEPHRINE 0.3 MG/0.3ML IJ SOAJ: 0.3000 mg | Freq: Once | INTRAMUSCULAR | 0 refills | Status: AC

## 2017-11-25 MED ORDER — AMITRIPTYLINE HCL 50 MG PO TABS: 100.0000 mg | ORAL_TABLET | Freq: Every day | ORAL | 1 refills | Status: DC

## 2017-11-25 MED ORDER — SUMATRIPTAN SUCCINATE 100 MG PO TABS: 100.0000 mg | ORAL_TABLET | Freq: Once | ORAL | 3 refills | Status: DC

## 2017-11-25 MED ORDER — FERROUS SULFATE 325 (65 FE) MG PO TBEC: 325.0000 mg | DELAYED_RELEASE_TABLET | Freq: Every day | ORAL | 3 refills | Status: DC

## 2017-11-25 NOTE — Assessment & Plan Note (Addendum)
Typical migraines with aura that at this time are poorly controlled. Frequent >3/wk. At times takes 3 imitrex 50mg  to help control. Will increase dose to 100mg  daily PRN. Will also increase amitriptyline dose to 100mg  nightly. Update me with effect.

## 2017-11-25 NOTE — Assessment & Plan Note (Signed)
Will need ASA 81mg  daily if not already taking

## 2017-11-25 NOTE — Assessment & Plan Note (Addendum)
Restart iron tablet daily. Ferritin levels low.  UTD colonoscopy.

## 2017-11-25 NOTE — Assessment & Plan Note (Signed)
Improved on latest check. Continue to monitor.

## 2017-11-25 NOTE — Assessment & Plan Note (Signed)
Preventative protocols reviewed and updated unless pt declined. Discussed healthy diet and lifestyle.  

## 2017-11-25 NOTE — Assessment & Plan Note (Signed)
Discussed healthy diet and lifestyle changes to affect sustainable weight loss. Pt motivated to start regular walking routine - knows local circle he can walk near his new place of residence.

## 2017-11-25 NOTE — Patient Instructions (Addendum)
If interested, check with pharmacy about new 2 shot shingles series (shingrix).  Increase amitriptyline to 100mg  at bedtime.  Start iron tablets daily Continue current thyroid dose.  Good to see you today.  Work on Ecologist - provided today.  Work on staying active, healthy diet choices - goal weight loss over the next 6-12 months is 13 lbs (goal 250lbs). Work on incorporating walking regimen into routine.  Return in 6 months for follow up visit.   Health Maintenance, Male A healthy lifestyle and preventive care is important for your health and wellness. Ask your health care provider about what schedule of regular examinations is right for you. What should I know about weight and diet? Eat a Healthy Diet  Eat plenty of vegetables, fruits, whole grains, low-fat dairy products, and lean protein.  Do not eat a lot of foods high in solid fats, added sugars, or salt.  Maintain a Healthy Weight Regular exercise can help you achieve or maintain a healthy weight. You should:  Do at least 150 minutes of exercise each week. The exercise should increase your heart rate and make you sweat (moderate-intensity exercise).  Do strength-training exercises at least twice a week.  Watch Your Levels of Cholesterol and Blood Lipids  Have your blood tested for lipids and cholesterol every 5 years starting at 67 years of age. If you are at high risk for heart disease, you should start having your blood tested when you are 67 years old. You may need to have your cholesterol levels checked more often if: ? Your lipid or cholesterol levels are high. ? You are older than 67 years of age. ? You are at high risk for heart disease.  What should I know about cancer screening? Many types of cancers can be detected early and may often be prevented. Lung Cancer  You should be screened every year for lung cancer if: ? You are a current smoker who has smoked for at least 30 years. ? You are a  former smoker who has quit within the past 15 years.  Talk to your health care provider about your screening options, when you should start screening, and how often you should be screened.  Colorectal Cancer  Routine colorectal cancer screening usually begins at 67 years of age and should be repeated every 5-10 years until you are 67 years old. You may need to be screened more often if early forms of precancerous polyps or small growths are found. Your health care provider may recommend screening at an earlier age if you have risk factors for colon cancer.  Your health care provider may recommend using home test kits to check for hidden blood in the stool.  A small camera at the end of a tube can be used to examine your colon (sigmoidoscopy or colonoscopy). This checks for the earliest forms of colorectal cancer.  Prostate and Testicular Cancer  Depending on your age and overall health, your health care provider may do certain tests to screen for prostate and testicular cancer.  Talk to your health care provider about any symptoms or concerns you have about testicular or prostate cancer.  Skin Cancer  Check your skin from head to toe regularly.  Tell your health care provider about any new moles or changes in moles, especially if: ? There is a change in a mole's size, shape, or color. ? You have a mole that is larger than a pencil eraser.  Always use sunscreen. Apply sunscreen liberally and  repeat throughout the day.  Protect yourself by wearing long sleeves, pants, a wide-brimmed hat, and sunglasses when outside.  What should I know about heart disease, diabetes, and high blood pressure?  If you are 65-27 years of age, have your blood pressure checked every 3-5 years. If you are 48 years of age or older, have your blood pressure checked every year. You should have your blood pressure measured twice-once when you are at a hospital or clinic, and once when you are not at a hospital or  clinic. Record the average of the two measurements. To check your blood pressure when you are not at a hospital or clinic, you can use: ? An automated blood pressure machine at a pharmacy. ? A home blood pressure monitor.  Talk to your health care provider about your target blood pressure.  If you are between 56-73 years old, ask your health care provider if you should take aspirin to prevent heart disease.  Have regular diabetes screenings by checking your fasting blood sugar level. ? If you are at a normal weight and have a low risk for diabetes, have this test once every three years after the age of 25. ? If you are overweight and have a high risk for diabetes, consider being tested at a younger age or more often.  A one-time screening for abdominal aortic aneurysm (AAA) by ultrasound is recommended for men aged 39-75 years who are current or former smokers. What should I know about preventing infection? Hepatitis B If you have a higher risk for hepatitis B, you should be screened for this virus. Talk with your health care provider to find out if you are at risk for hepatitis B infection. Hepatitis C Blood testing is recommended for:  Everyone born from [birth year] through 1965.  Anyone with known risk factors for hepatitis C.  Sexually Transmitted Diseases (STDs)  You should be screened each year for STDs including gonorrhea and chlamydia if: ? You are sexually active and are younger than 67 years of age. ? You are older than 67 years of age and your health care provider tells you that you are at risk for this type of infection. ? Your sexual activity has changed since you were last screened and you are at an increased risk for chlamydia or gonorrhea. Ask your health care provider if you are at risk.  Talk with your health care provider about whether you are at high risk of being infected with HIV. Your health care provider may recommend a prescription medicine to help prevent HIV  infection.  What else can I do?  Schedule regular health, dental, and eye exams.  Stay current with your vaccines (immunizations).  Do not use any tobacco products, such as cigarettes, chewing tobacco, and e-cigarettes. If you need help quitting, ask your health care provider.  Limit alcohol intake to no more than 2 drinks per day. One drink equals 12 ounces of beer, 5 ounces of wine, or 1 ounces of hard liquor.  Do not use street drugs.  Do not share needles.  Ask your health care provider for help if you need support or information about quitting drugs.  Tell your health care provider if you often feel depressed.  Tell your health care provider if you have ever been abused or do not feel safe at home. This information is not intended to replace advice given to you by your health care provider. Make sure you discuss any questions you have with your health care  provider. Document Released: 03/29/2008 Document Revised: 05/30/2016 Document Reviewed: 07/05/2015 Elsevier Interactive Patient Education  [employer].

## 2017-11-25 NOTE — Progress Notes (Signed)
BP 136/84 (BP Location: Left Arm, Patient Position: Sitting, Cuff Size: Large)   Pulse 76   Temp 98.4 F (36.9 C) (Oral)   Ht 5\' 9"  (1.753 m)   Wt 263 lb (119.3 kg)   SpO2 95%   BMI 38.84 kg/m    CC: CPE Subjective:    Patient ID: Nicholas [name], male    DOB: [birth year], 67 y.o.   MRN: [id number]  HPI: Nicholas [name] is a 67 y.o. male presenting on 11/25/2017 for Annual Exam (Pt 2. Wants to discuss Imitrex. Has more migraines than insurance will allow meds for.)   Upcoming move to [place] in [place].   Saw Lesia last week for medicare wellness visit. Note reviewed. He did have a fall 1 month ago - slipped on carpet. Called 911 to have EMS help him get up.   Recently discharged from [facility] after L shoulder replacement (Dr [name]. Came home 09/21/2017.   Migraines - imitrex effective, however having >3/wk. Describes typical migraines with aura - associated with photo/phonophobia, nausea, activity limiting.   Preventative: COLONOSCOPY 11/2012 diverticulosis, rpt 10 yrs [name]) Prostate cancer screening - declines  Lung cancer screening - not eligible  DEXA - pt states he had this done at [facility]  Flu shot yearly Td 2007 Pneumovax 2012, prevnar 2017 zostavax - ~2014 Shingrix - discussed Advanced directive discussion - has not set up. Son would be HCPOA. Packet provided today. Seat belt use discussed  Sunscreen use discussed. No changing moles on skin.  Non smoker  Alcohol - none   Widower 2016 - wife passed from ESRD related illness Occ: landscaping Edu: HS Activity: no regular exercise  Diet: good water, fruits/vegetables daily  Relevant past medical, surgical, family and social history reviewed and updated as indicated. Interim medical history since our last visit reviewed. Allergies and medications reviewed and updated. Outpatient Medications Prior to Visit  Medication Sig Dispense Refill  . gabapentin (NEURONTIN) 300 MG capsule  TAKE ONE CAPSULE BY MOUTH 4 TIMES A DAY 360 capsule 1  . HYDROcodone-acetaminophen (NORCO) 10-325 MG tablet Take 1 tablet by mouth every 4 (four) hours as needed for moderate pain ((score 4 to 6)). 60 tablet 0  . hydrocortisone 2.5 % cream Apply 1 application topically at bedtime as needed (for rash).     . Ibuprofen 200 MG CAPS Take 2 capsules by mouth at bedtime.    [name] levothyroxine (SYNTHROID, LEVOTHROID) 50 MCG tablet Take 1 tablet (50 mcg total) by mouth daily before breakfast. 90 tablet 1  . Multiple Vitamins-Minerals (CENTRUM SILVER PO) Take 1 tablet daily by mouth.    . pantoprazole (PROTONIX) 40 MG tablet TAKE 1 TABLET (40 MG TOTAL) BY MOUTH DAILY BEFORE BREAKFAST. 30 tablet 8  . Probiotic Product (PROBIOTIC PO) Take 1 capsule daily by mouth.    [name] amitriptyline (ELAVIL) 25 MG tablet TAKE 3 TABLETS BY MOUTH AT BEDTIME 270 tablet 1  . EPINEPHrine (EPIPEN 2-PAK) 0.3 mg/0.3 mL IJ SOAJ injection Inject 0.3 mg once into the muscle.     . SUMAtriptan (IMITREX) 50 MG tablet TAKE 1 TABLET (50 MG TOTAL) BY MOUTH ONCE. MAY REPEAT IN 2 HOURS IF HEADACHE PERSISTS OR RECURS. 12 tablet 3   No facility-administered medications prior to visit.      Per HPI unless specifically indicated in ROS section below Review of Systems  Constitutional: Negative for activity change, appetite change, chills, fatigue, fever and unexpected weight change.  HENT: Negative for hearing  loss.   Eyes: Negative for visual disturbance.  Respiratory: Negative for cough, chest tightness, shortness of breath and wheezing.   Cardiovascular: Positive for leg swelling (right leg). Negative for chest pain and palpitations.  Gastrointestinal: Negative for abdominal distention, abdominal pain, blood in stool, constipation, diarrhea, nausea and vomiting.  Genitourinary: Negative for difficulty urinating and hematuria.  Musculoskeletal: Negative for arthralgias, myalgias and neck pain.  Skin: Negative for rash.  Neurological:  Positive for headaches (migraines). Negative for dizziness, seizures and syncope.  Hematological: Negative for adenopathy. Does not bruise/bleed easily.  Psychiatric/Behavioral: Positive for dysphoric mood. The patient is not nervous/anxious.        Objective:    BP 136/84 (BP Location: Left Arm, Patient Position: Sitting, Cuff Size: Large)   Pulse 76   Temp 98.4 F (36.9 C) (Oral)   Ht 5\' 9"  (1.753 m)   Wt 263 lb (119.3 kg)   SpO2 95%   BMI 38.84 kg/m   Wt Readings from Last 3 Encounters:  11/25/17 263 lb (119.3 kg)  11/18/17 264 lb 12 oz (120.1 kg)  10/01/17 263 lb (119.3 kg)    Physical Exam  Constitutional: He is oriented to person, place, and time. He appears well-developed and well-nourished. No distress.  HENT:  Head: Normocephalic and atraumatic.  Right Ear: Hearing, tympanic membrane, external ear and ear canal normal.  Left Ear: Hearing, tympanic membrane, external ear and ear canal normal.  Nose: Nose normal.  Mouth/Throat: Uvula is midline, oropharynx is clear and moist and mucous membranes are normal. No oropharyngeal exudate, posterior oropharyngeal edema or posterior oropharyngeal erythema.  Eyes: Conjunctivae and EOM are normal. Pupils are equal, round, and reactive to light. No scleral icterus.  Neck: Normal range of motion. Neck supple. No thyromegaly present.  Cardiovascular: Normal rate, regular rhythm, normal heart sounds and intact distal pulses.  No murmur heard. Pulses:      Radial pulses are 2+ on the right side, and 2+ on the left side.  Pulmonary/Chest: Effort normal and breath sounds normal. No respiratory distress. He has no wheezes. He has no rales.  Abdominal: Soft. Bowel sounds are normal. He exhibits no distension and no mass. There is no tenderness. There is no rebound and no guarding.  Musculoskeletal: Normal range of motion. He exhibits no edema.  Lymphadenopathy:    He has no cervical adenopathy.  Neurological: He is alert and oriented to  person, place, and time.  CN grossly intact, station and gait intact  Skin: Skin is warm and dry. No rash noted.  Psychiatric: He has a normal mood and affect. His behavior is normal. Judgment and thought content normal.  Nursing note and vitals reviewed.  Results for orders placed or performed in visit on 11/18/17  Ferritin  Result Value Ref Range   Ferritin 21.9 (L) 22.0 - 322.0 ng/mL  IBC panel  Result Value Ref Range   Iron 40 (L) 42 - 165 ug/dL   Transferrin 273.0 212.0 - 360.0 mg/dL   Saturation Ratios 10.5 (L) 20.0 - 50.0 %  T4, free  Result Value Ref Range   Free T4 0.89 0.60 - 1.60 ng/dL  TSH  Result Value Ref Range   TSH 8.58 (H) 0.35 - 4.50 uIU/mL   Lab Results  Component Value Date   CREATININE 1.18 10/01/2017       Assessment & Plan:   Problem List Items Addressed This Visit    Advanced care planning/counseling discussion    Advanced directive discussion - has not  set up. Son would be HCPOA. Packet provided today.      Bilateral shoulder pain    S/p L shoulder replacement      Health maintenance examination - Primary    Preventative protocols reviewed and updated unless pt declined. Discussed healthy diet and lifestyle.       History of CVA in adulthood    Will need ASA 81mg  daily if not already taking       Hypothyroidism    TSH remains elevated, T4 normal. Has only been on new regimen x 1 month - will continue this dose for now.  No DEXA records at [facility] - consider DEXA.       IDA (iron deficiency anemia)    Restart iron tablet daily. Ferritin levels low.  UTD colonoscopy.       Relevant Medications   ferrous sulfate 325 (65 FE) MG EC tablet   Migraine    Typical migraines with aura that at this time are poorly controlled. Frequent >3/wk. At times takes 3 imitrex 50mg  to help control. Will increase dose to 100mg  daily PRN. Will also increase amitriptyline dose to 100mg  nightly. Update me with effect.       Relevant Medications    EPINEPHrine (EPIPEN 2-PAK) 0.3 mg/0.3 mL IJ SOAJ injection   SUMAtriptan (IMITREX) 100 MG tablet   amitriptyline (ELAVIL) 50 MG tablet   Renal insufficiency    Improved on latest check. Continue to monitor.       Severe obesity (BMI 35.0-39.9) with comorbidity ([place])    Discussed healthy diet and lifestyle changes to affect sustainable weight loss. Pt motivated to start regular walking routine - knows local circle he can walk near his new place of residence.           Follow up plan: Return in about 6 months (around 05/25/2018) for follow up visit.  [name], MD

## 2017-11-25 NOTE — Assessment & Plan Note (Signed)
Advanced directive discussion - has not set up. Son would be HCPOA. Packet provided today.

## 2017-11-25 NOTE — Assessment & Plan Note (Signed)
S/p L shoulder replacement

## 2017-11-25 NOTE — Assessment & Plan Note (Addendum)
TSH remains elevated, T4 normal. Has only been on new regimen x 1 month - will continue this dose for now.  No DEXA records at [name] - consider DEXA.

## 2017-12-25 ENCOUNTER — Ambulatory Visit (INDEPENDENT_AMBULATORY_CARE_PROVIDER_SITE_OTHER): Payer: PPO | Admitting: Orthopedic Surgery

## 2017-12-25 ENCOUNTER — Encounter (INDEPENDENT_AMBULATORY_CARE_PROVIDER_SITE_OTHER): Payer: Self-pay | Admitting: Orthopedic Surgery

## 2017-12-25 DIAGNOSIS — R600 Localized edema: Secondary | ICD-10-CM | POA: Diagnosis not present

## 2017-12-25 NOTE — Progress Notes (Signed)
Office Visit Note   Patient: Nicholas [name]           Date of Birth: June 19, 1951           MRN: [id number] Visit Date: 12/25/2017 Requested by: [name], MD [address] [place], [place] [address] PCP: [name], MD  Subjective: Chief Complaint  Patient presents with  . leg swelling    patient complains of bilateral LE leg swelling for the last few weeks.     HPI: Nicholas [name] is a 67 year old patient with bilateral lower extremity swelling right worse than left.  He is noticed it for several weeks.  He had to move out of his house to an apartment.  This involved a lot of activity.  States that the swelling goes down when he sleeps but recurs when he is up and around.  He does have bilateral TED hose but he does not like wearing them because it hurts his feet.  He is doing well with his left total knee replacement.  Also doing well with his left shoulder replacement.              ROS: All systems reviewed are negative as they relate to the chief complaint within the history of present illness.  Patient denies  fevers or chills.   Assessment & Plan: Visit Diagnoses:  1. Bilateral leg edema     Plan: Impression is bilateral leg edema which may be cardiac or renal in nature.  Needs medical workup for this problem.  Does have bilateral pitting edema up to the superior tibial plateau.  Once he gets this issue worked out we can consider looking at his right shoulder for replacement.  Follow-Up Instructions: Return if symptoms worsen or fail to improve.   Orders:  Orders Placed This Encounter  Procedures  . Ambulatory referral to [facility]   No orders of the defined types were placed in this encounter.     Procedures: No procedures performed   Clinical Data: No additional findings.  Objective: Vital Signs: There were no vitals taken for this visit.  Physical Exam:   Constitutional: Patient appears well-developed HEENT:  Head:  Normocephalic Eyes:EOM are normal Neck: Normal range of motion Cardiovascular: Normal rate Pulmonary/chest: Effort normal Neurologic: Patient is alert Skin: Skin is warm Psychiatric: Patient has normal mood and affect    Ortho Exam: Orthopedic exam demonstrates good range of motion of the left knee.  Right knee has been fused.  Pedal pulses palpable.  There is pitting edema bilateral lower extremities up to the tibia plateau.  No warmth.  Knee range of motion on the left is excellent.  Left shoulder range of motion also good with over 90 degrees of forward flexion and abduction.  Right shoulder range of motion very limited.  Specialty Comments:  No specialty comments available.  Imaging: No results found.   PMFS History: Patient Active Problem List   Diagnosis Date Noted  . Health maintenance examination 11/25/2017  . Renal insufficiency 10/01/2017  . Status post reverse total replacement of left shoulder 10/01/2017  . Fatty liver 10/01/2017  . Bilateral shoulder pain 05/24/2017  . History of CVA in adulthood 03/08/2017  . S/P total knee arthroplasty, left 01/30/2017  . Arthritis of knee 01/15/2017  . Skin rash 12/10/2016  . Medicare annual wellness visit, initial 10/05/2016  . Advanced care planning/counseling discussion 10/05/2016  . Diarrhea 08/21/2016  . Severe obesity (BMI 35.0-39.9) with comorbidity ([place]) 07/19/2016  . Hypothyroidism   .  IDA (iron deficiency anemia) 04/23/2016  . Chronic pain syndrome 04/23/2016  . Inflammatory arthritis 04/23/2016  . Chronic leg pain 04/23/2016  . Lesion of palate 04/23/2016  . Grieving 04/23/2016  . GERD (gastroesophageal reflux disease)   . Duodenitis determined by biopsy 02/13/2016  . Diverticulosis of colon without hemorrhage 01/31/2016  . Migraine 01/11/2012  . History of methicillin resistant staphylococcus aureus (MRSA) 07/04/2010  . DEGENERATIVE DISC DISEASE 07/04/2010  . History of total right knee replacement  07/04/2010  . Osteoarthritis 06/09/2007  . Personal history of colonic adenoma 06/01/2003   Past Medical History:  Diagnosis Date  . Acute kidney failure 08/2008   "cleared up"  no problems since  . Allergy   . Anxiety   . Arthritis   . Asthma    ?of this no inhaler  . Blood transfusion   . CHF (congestive heart failure) ([place])    ?of this, pt denies  . CLOSTRIDIUM DIFFICILE COLITIS 07/04/2010   Annotation: 12/09, 2/10 Qualifier: Diagnosis of  By: [name], [name]    . Depression   . Depression with anxiety   . Duodenitis determined by biopsy 02/2016   peptic likely due to aleve (erosive gastropathy with duodenal erosions)  . ECZEMA 07/04/2010   Qualifier: Diagnosis of  By: [name], [name]    . Elevated liver enzymes   . GERD (gastroesophageal reflux disease)   . Heart attack ([place])    08/2008 (likely demand ischemia in the setting of MSSA sepsis/R TKA  infection)10-2008  . History of hiatal hernia   . History of stomach ulcers   . Hypothyroidism   . Lower GI bleeding   . MALAR AND MAXILLARY BONES CLOSED FRACTURE 07/04/2010   Annotation: ORIF Qualifier: Diagnosis of  By: [name], [name]    . Migraine    "definitely"  . MRSA (methicillin resistant Staphylococcus aureus)    in leg, had to place steel rod in leg  . Personal history of colonic adenoma 06/01/2003  . PFO (patent foramen ovale)    small PFO by 08/2008 TEE  . Pneumonia 08/2008   "while in ICU"  . Seizures ([place]) 2009   "long time ago"    . Stroke [facility]) 2010   unable to complete sentences at times    Family History  Problem Relation Age of Onset  . Coronary artery disease Father   . Heart disease Mother   . Breast cancer Sister   . Diabetes Brother   . Diabetes Sister   . Colon cancer Neg Hx   . Stomach cancer Neg Hx     Past Surgical History:  Procedure Laterality Date  . anterior  nerve transposition  07/2009   left ulnar nerve  . antibiotic spacer exchange  11/2008; 08/2006   right knee  .  ARTHROTOMY  08/2008   right knee w/I&D  . CATARACT EXTRACTION    . COLONOSCOPY  11/2012   diverticulosis, rpt 10 yrs [name])  . ESOPHAGOGASTRODUODENOSCOPY  02/2016   erosive gastropathy with duodenal erosions [name])  . HARDWARE REMOVAL  04/2006   right knee w/antibiotic spacers placed  . INGUINAL HERNIA REPAIR  early 1990's   bilateral  . JOINT REPLACEMENT Right    rt knee  . KNEE ARTHROSCOPY  10/2001   right  . KNEE FUSION  03/2009   right knee removal; antibiotic spacers;   . REPLACEMENT TOTAL KNEE  08/2008; 09/2001   right  . REVERSE SHOULDER ARTHROPLASTY Left 09/03/2017   Procedure: REVERSE SHOULDER ARTHROPLASTY;  Surgeon: Meredith Pel, MD;  Location: [facility];  Service: Orthopedics;  Laterality: Left;  . rod placement Right   . SYNOVECTOMY  06/2005   debridement, liner exchange right knee  . TOE AMPUTATION Left 08/2008   great toe  . TOE AMPUTATION Left 04/2016   2nd toe [name]  . TONSILLECTOMY    . TOTAL KNEE ARTHROPLASTY Left 01/15/2017  . TOTAL KNEE ARTHROPLASTY Left 01/15/2017   Procedure: TOTAL KNEE ARTHROPLASTY;  Surgeon: Meredith Pel, MD;  Location: [name];  Service: Orthopedics;  Laterality: Left;   Social History   Occupational History  . Occupation: retired  Tobacco Use  . Smoking status: Never Smoker  . Smokeless tobacco: Never Used  Substance and Sexual Activity  . Alcohol use: No    Alcohol/week: 0.0 oz    Comment: "I abused alcohol; last drink  ~ 2005"  . Drug use: No  . Sexual activity: No

## 2017-12-27 ENCOUNTER — Other Ambulatory Visit: Payer: Self-pay | Admitting: Family Medicine

## 2017-12-27 ENCOUNTER — Encounter: Payer: Self-pay | Admitting: Family Medicine

## 2017-12-27 ENCOUNTER — Ambulatory Visit (INDEPENDENT_AMBULATORY_CARE_PROVIDER_SITE_OTHER): Payer: PPO | Admitting: Family Medicine

## 2017-12-27 VITALS — BP 150/92 | HR 91 | Temp 97.9°F | Wt 274.0 lb

## 2017-12-27 DIAGNOSIS — I5032 Chronic diastolic (congestive) heart failure: Secondary | ICD-10-CM | POA: Insufficient documentation

## 2017-12-27 DIAGNOSIS — R079 Chest pain, unspecified: Secondary | ICD-10-CM | POA: Diagnosis not present

## 2017-12-27 DIAGNOSIS — I509 Heart failure, unspecified: Secondary | ICD-10-CM

## 2017-12-27 DIAGNOSIS — G4733 Obstructive sleep apnea (adult) (pediatric): Secondary | ICD-10-CM

## 2017-12-27 LAB — BRAIN NATRIURETIC PEPTIDE: Pro B Natriuretic peptide (BNP): 36 pg/mL (ref 0.0–100.0)

## 2017-12-27 LAB — COMPREHENSIVE METABOLIC PANEL
ALT: 14 U/L (ref 0–53)
AST: 17 U/L (ref 0–37)
Albumin: 4 g/dL (ref 3.5–5.2)
Alkaline Phosphatase: 90 U/L (ref 39–117)
BUN: 18 mg/dL (ref 6–23)
CHLORIDE: 104 meq/L (ref 96–112)
CO2: 29 meq/L (ref 19–32)
CREATININE: 1.26 mg/dL (ref 0.40–1.50)
Calcium: 9 mg/dL (ref 8.4–10.5)
GFR: 60.65 mL/min (ref >60.00)
GLUCOSE: 99 mg/dL (ref 70–99)
Potassium: 4.1 mEq/L (ref 3.5–5.1)
SODIUM: 138 meq/L (ref 135–145)
Total Bilirubin: 1.2 mg/dL (ref 0.2–1.2)
Total Protein: 6.8 g/dL (ref 6.0–8.3)

## 2017-12-27 LAB — CBC WITH DIFFERENTIAL/PLATELET
BASOS PCT: 1.3 % (ref 0.0–3.0)
Basophils Absolute: 0.1 10*3/uL (ref 0.0–0.1)
EOS PCT: 5.9 % — AB (ref 0.0–5.0)
Eosinophils Absolute: 0.5 10*3/uL (ref 0.0–0.7)
HCT: 40.3 % (ref 39.0–52.0)
Hemoglobin: 13.4 g/dL (ref 13.0–17.0)
LYMPHS ABS: 1.6 10*3/uL (ref 0.7–4.0)
Lymphocytes Relative: 19.6 % (ref 12.0–46.0)
MCHC: 33.3 g/dL (ref 30.0–36.0)
MCV: 85.7 fl (ref 78.0–100.0)
MONO ABS: 0.9 10*3/uL (ref 0.1–1.0)
Monocytes Relative: 11.7 % (ref 3.0–12.0)
NEUTROS ABS: 4.9 10*3/uL (ref 1.4–7.7)
NEUTROS PCT: 61.5 % (ref 43.0–77.0)
Platelets: 286 10*3/uL (ref 150.0–400.0)
RBC: 4.71 Mil/uL (ref 4.22–5.81)
RDW: 14.9 % (ref 11.5–15.5)
WBC: 8 10*3/uL (ref 4.0–10.5)

## 2017-12-27 LAB — TSH: TSH: 5.66 u[IU]/mL — ABNORMAL HIGH (ref 0.35–4.50)

## 2017-12-27 LAB — TROPONIN I: TNIDX: 0 ug/L (ref 0.00–0.06)

## 2017-12-27 MED ORDER — FUROSEMIDE 20 MG PO TABS: 20.0000 mg | ORAL_TABLET | Freq: Every day | ORAL | 3 refills | Status: DC | PRN

## 2017-12-27 MED ORDER — NITROGLYCERIN 0.4 MG SL SUBL
0.4000 mg | SUBLINGUAL_TABLET | Freq: Once | SUBLINGUAL | Status: AC
Start: 2017-12-27 — End: 2017-12-27
  Administered 2017-12-27: 0.4 mg via SUBLINGUAL

## 2017-12-27 MED ORDER — LEVOTHYROXINE SODIUM 75 MCG PO TABS: 75.0000 ug | ORAL_TABLET | Freq: Every day | ORAL | 1 refills | Status: DC

## 2017-12-27 MED ORDER — ASPIRIN 81 MG PO TABS: 81.0000 mg | ORAL_TABLET | Freq: Every day | ORAL | Status: DC

## 2017-12-27 MED ORDER — EPINEPHRINE 0.3 MG/0.3ML IJ SOAJ: 0.3000 mg | Freq: Once | INTRAMUSCULAR | 1 refills | Status: AC

## 2017-12-27 NOTE — Addendum Note (Signed)
Addended by: Brenton Grills on: 0/06/2329 07:62 PM   Modules accepted: Orders

## 2017-12-27 NOTE — Progress Notes (Signed)
BP (!) 150/92 (BP Location: Right Arm, Cuff Size: Large)   Pulse 91   Temp 97.9 F (36.6 C) (Oral)   Wt 274 lb (124.3 kg)   SpO2 93%   BMI 40.46 kg/m    CC: leg swelling Subjective:    Patient ID: Nicholas [name], male    DOB: 1951-01-13, 67 y.o.   MRN: [id number]  HPI: TOY EISEMANN is a 67 y.o. male presenting on 12/27/2017 for Leg Swelling (Bilateral lower LE swelling. Started about 2 wks ago. Was told by othro it may be due to heart or kidneys. ); Fatigue (Started about 2 days ago.); and Medication request (Requests rx for EpiPen)   Here with friend (deceased wife's prior dialysis nurse). She drove him here because he didn't feel able to drive. Endorses 2d h/o malaise, weakness and fatigue. Some chest tightness/soreness described as 5/10 pressure, along with both exertional and nonexertional dyspnea, mild headache. Also endorsing 2 wk h/o worsening leg swelling.   Cardiologist Dr Einar Gip - last seen >5 yrs ago.  Endorses h/o CHF, no other known CAD hx. I have no records of prior cardiac evaluation - records requested today. Doesn't think he's been on lasix before.   Saw ortho on Wednesday with bilateral pitting edema for several weeks - not thought ortho related and recommended PCP eval. Here for this today. No chest pain at that time.   More stress - staying with sister currently, out of house.   Relevant past medical, surgical, family and social history reviewed and updated as indicated. Interim medical history since our last visit reviewed. Allergies and medications reviewed and updated. Outpatient Medications Prior to Visit  Medication Sig Dispense Refill  . amitriptyline (ELAVIL) 50 MG tablet Take 2 tablets (100 mg total) by mouth at bedtime. 60 tablet 1  . ferrous sulfate 325 (65 FE) MG EC tablet Take 1 tablet (325 mg total) by mouth daily with breakfast.  3  . gabapentin (NEURONTIN) 300 MG capsule TAKE ONE CAPSULE BY MOUTH 4 TIMES A DAY 360 capsule 1  .  HYDROcodone-acetaminophen (NORCO) 10-325 MG tablet Take 1 tablet by mouth every 4 (four) hours as needed for moderate pain ((score 4 to 6)). 60 tablet 0  . hydrocortisone 2.5 % cream Apply 1 application topically at bedtime as needed (for rash).     . Ibuprofen 200 MG CAPS Take 2 capsules by mouth at bedtime.    [name] levothyroxine (SYNTHROID, LEVOTHROID) 50 MCG tablet Take 1 tablet (50 mcg total) by mouth daily before breakfast. 90 tablet 1  . Multiple Vitamins-Minerals (CENTRUM SILVER PO) Take 1 tablet daily by mouth.    . pantoprazole (PROTONIX) 40 MG tablet TAKE 1 TABLET (40 MG TOTAL) BY MOUTH DAILY BEFORE BREAKFAST. 30 tablet 8  . Probiotic Product (PROBIOTIC PO) Take 1 capsule daily by mouth.    . SUMAtriptan (IMITREX) 100 MG tablet Take 1 tablet (100 mg total) by mouth once for 1 dose. May repeat in 2 hours if headache persists or recurs. 12 tablet 3   No facility-administered medications prior to visit.      Per HPI unless specifically indicated in ROS section below Review of Systems     Objective:    BP (!) 150/92 (BP Location: Right Arm, Cuff Size: Large)   Pulse 91   Temp 97.9 F (36.6 C) (Oral)   Wt 274 lb (124.3 kg)   SpO2 93%   BMI 40.46 kg/m   Wt Readings from Last 3 Encounters:  12/27/17 274 lb (124.3 kg)  11/25/17 263 lb (119.3 kg)  11/18/17 264 lb 12 oz (120.1 kg)    Physical Exam  Constitutional: He appears well-developed and well-nourished. No distress.  HENT:  Head: Normocephalic and atraumatic.  Mouth/Throat: Oropharynx is clear and moist. No oropharyngeal exudate.  Eyes: Conjunctivae and EOM are normal. Pupils are equal, round, and reactive to light. No scleral icterus.  Neck: Normal range of motion. Neck supple.  Cardiovascular: Normal rate, regular rhythm, normal heart sounds and intact distal pulses.  No murmur heard. Pulmonary/Chest: Effort normal and breath sounds normal. No respiratory distress. He has no wheezes. He has no rales. He exhibits  tenderness (mild discomfort at xyphoid).  Lungs clear  Musculoskeletal: He exhibits edema (1+ pitting to mid shin).  Skin: Skin is warm and dry. No rash noted.  Psychiatric: He has a normal mood and affect.  Nursing note and vitals reviewed.  Results for orders placed or performed in visit on 11/18/17  Ferritin  Result Value Ref Range   Ferritin 21.9 (L) 22.0 - 322.0 ng/mL  IBC panel  Result Value Ref Range   Iron 40 (L) 42 - 165 ug/dL   Transferrin 273.0 212.0 - 360.0 mg/dL   Saturation Ratios 10.5 (L) 20.0 - 50.0 %  T4, free  Result Value Ref Range   Free T4 0.89 0.60 - 1.60 ng/dL  TSH  Result Value Ref Range   TSH 8.58 (H) 0.35 - 4.50 uIU/mL   Lab Results  Component Value Date   WBC 11.7 (H) 08/21/2017   HGB 15.5 08/21/2017   HCT 46.0 08/21/2017   MCV 90.2 08/21/2017   PLT 220 08/21/2017    Lab Results  Component Value Date   CREATININE 1.18 10/01/2017   BUN 16 10/01/2017   NA 139 10/01/2017   K 4.3 10/01/2017   CL 104 10/01/2017   CO2 28 10/01/2017    EKG - NSR rate 80, normal axis, intervals, no acute ST/T changes, poor R wave progression (chronic), no acute change since prior EKG 10/2016    Assessment & Plan:   Problem List Items Addressed This Visit    Acute on chronic congestive heart failure ([place])    Start lasix 20mg  daily. Await STAT labwork from today.       Relevant Medications   EPINEPHrine 0.3 mg/0.3 mL IJ SOAJ injection   furosemide (LASIX) 20 MG tablet   aspirin 81 MG tablet   Chest pain - Primary    10 lb weight gain over the past month now with 2d h/o substernal chest pressure at rest. Concern for acute CHF exacerbation leading to chest discomfort, leg swelling. EKG non-acute today. Will check STAT TnI and other labs today including Cr, TSH, BNP. No significant improvement after SL nitro.  Reviewed ER eval/management today for presumed acute CHF exacerbation vs trial management at home of CHF exacerbation with red flags to seek ER care. He would  like to try home management. Start lasix 20mg  daily, aspirin 81mg  daily.  Prior saw Dr Einar Gip but not in last several years. Records requested today. Will also refer back to cards.       Relevant Orders   EKG 12-Lead (Completed)   Comprehensive metabolic panel   TSH   CBC with Differential/Platelet   Troponin I   Brain natriuretic peptide       Meds ordered this encounter  Medications  . EPINEPHrine 0.3 mg/0.3 mL IJ SOAJ injection    Sig: Inject 0.3 mLs (0.3  mg total) into the muscle once for 1 dose.    Dispense:  1 Device    Refill:  1  . furosemide (LASIX) 20 MG tablet    Sig: Take 1 tablet (20 mg total) by mouth daily as needed for fluid or edema.    Dispense:  30 tablet    Refill:  3  . aspirin 81 MG tablet    Sig: Take 1 tablet (81 mg total) by mouth daily.   Orders Placed This Encounter  Procedures  . Comprehensive metabolic panel  . TSH  . CBC with Differential/Platelet  . Troponin I  . Brain natriuretic peptide  . EKG 12-Lead    Follow up plan: No Follow-up on file.  [name], MD

## 2017-12-27 NOTE — Assessment & Plan Note (Addendum)
10 lb weight gain over the past month now with 2d h/o substernal chest pressure at rest. Concern for acute CHF exacerbation leading to chest discomfort, leg swelling. EKG non-acute today. Will check STAT TnI and other labs today including Cr, TSH, BNP. No significant improvement after SL nitro.  Reviewed ER eval/management today for presumed acute CHF exacerbation vs trial management at home of CHF exacerbation with red flags to seek ER care. He would like to try home management. Start lasix 20mg  daily, aspirin 81mg  daily.  Prior saw Dr Einar Gip but not in last several years. Records requested today. Will also refer back to cards.

## 2017-12-27 NOTE — Patient Instructions (Addendum)
Sign release for latest records from Dr Irven Shelling office.  Epi pen Rx sent in. Sublingual nitro today. STAT labs today - we will be in touch if you need to go to the hospital.  I think you have a CHF exacerbation leading to shortness of breath and leg swelling. Start lasix 20mg  daily in the morning. Start baby aspirin daily.  Call me on Monday with an update and your weight that day.  If any worsening over weekend, go to ER or call 911.

## 2017-12-27 NOTE — Assessment & Plan Note (Signed)
Start lasix 20mg  daily. Await STAT labwork from today.

## 2017-12-30 ENCOUNTER — Ambulatory Visit: Payer: Self-pay | Admitting: *Deleted

## 2017-12-30 ENCOUNTER — Other Ambulatory Visit: Payer: Self-pay | Admitting: Family Medicine

## 2017-12-30 DIAGNOSIS — I509 Heart failure, unspecified: Secondary | ICD-10-CM

## 2017-12-30 DIAGNOSIS — R079 Chest pain, unspecified: Secondary | ICD-10-CM

## 2017-12-30 NOTE — Telephone Encounter (Signed)
Done

## 2017-12-30 NOTE — Telephone Encounter (Signed)
Patient seen on 12/27/17, patient was told to call back and let the dr know how is was feeling. Patient is feeling somewhat better, swelling in feet is gone, some swelling still in legs.    Call to patient: Patient reports he has moved in with his sister and he is waiting for placement at [facility].  Patient reports his weight this morning is 267 lb- swelling in feet is gone with some swelling still in legs. He still has discomfort in his lower part of the chest- but does report it has improved since Friday.  Told patient would let provider know how he is doing and would contact him about his follow up.

## 2017-12-30 NOTE — Telephone Encounter (Signed)
Referral was done through the [facility] b/c it was done while in the [place]. Please place Cardiology Referral in Epic. Patient will call directly b/c he has a balance at Dr Irven Shelling office so must take care of balance before Appt can be made.Patient will call me back after calling the office. Offered to set him up with [facility] office if necessary.

## 2017-12-30 NOTE — Telephone Encounter (Signed)
Thanks for the update.  Let's continue lasix 20mg  daily for next 3 days then if doing well may back down to 20mg  daily as needed for weight gain >2 lbs in 1 day or leg swelling.  I will also go ahead and refer to cards for further eval of recent chest pain.

## 2017-12-30 NOTE — Telephone Encounter (Signed)
Left message on vm per dpr relaying instructions and message per Dr. G.  

## 2017-12-31 ENCOUNTER — Other Ambulatory Visit: Payer: Self-pay | Admitting: Family Medicine

## 2017-12-31 DIAGNOSIS — K269 Duodenal ulcer, unspecified as acute or chronic, without hemorrhage or perforation: Secondary | ICD-10-CM

## 2017-12-31 DIAGNOSIS — K2960 Other gastritis without bleeding: Secondary | ICD-10-CM

## 2018-01-01 ENCOUNTER — Other Ambulatory Visit: Payer: Self-pay | Admitting: Family Medicine

## 2018-01-01 DIAGNOSIS — I10 Essential (primary) hypertension: Secondary | ICD-10-CM | POA: Diagnosis not present

## 2018-01-01 DIAGNOSIS — R0609 Other forms of dyspnea: Secondary | ICD-10-CM | POA: Diagnosis not present

## 2018-01-01 DIAGNOSIS — I209 Angina pectoris, unspecified: Secondary | ICD-10-CM | POA: Diagnosis not present

## 2018-01-01 DIAGNOSIS — R600 Localized edema: Secondary | ICD-10-CM | POA: Diagnosis not present

## 2018-01-01 NOTE — Telephone Encounter (Signed)
Pt also states he is going Friday for stress test and next week having an echocardiogram. To let Dr. Darnell Level know.  Copied from [place] [phone]. Topic: Quick Communication - Rx Refill/Question >> Jan 01, 2018  4:22 PM Margot Ables wrote: Medication: hydrocodone - pt requesting refill states he takes for general pain - advised him last ordered by Ortho Dr. [name] Sa - pt said he's gotten from PCP Has the patient contacted their pharmacy? No. controlled Preferred Pharmacy (with phone number or street name): [id number] [address], [place]. [phone] (Phone) [phone] (Fax)

## 2018-01-02 NOTE — Telephone Encounter (Signed)
Last rx (from Dr. [name] Sa- ortho):  09/06/17, #60 Last OV (w/ Dr. Darnell Level):  12/27/17 Next OV:  12/24/17

## 2018-01-03 DIAGNOSIS — I209 Angina pectoris, unspecified: Secondary | ICD-10-CM | POA: Diagnosis not present

## 2018-01-03 NOTE — Telephone Encounter (Signed)
Spoke with pt relaying message per Dr. G. Pt verbalizes understanding. 

## 2018-01-03 NOTE — Telephone Encounter (Signed)
I have not prescribed narcotics for patient before - this has come from ortho.  Would have him check with ortho office first.

## 2018-01-07 ENCOUNTER — Telehealth (INDEPENDENT_AMBULATORY_CARE_PROVIDER_SITE_OTHER): Payer: Self-pay | Admitting: Orthopedic Surgery

## 2018-01-07 NOTE — Telephone Encounter (Signed)
Ok to refill 

## 2018-01-07 NOTE — Telephone Encounter (Signed)
Patient requesting rx refill on hydrocodone-acetaminophen 10-325 mg. # [phone]

## 2018-01-08 NOTE — Telephone Encounter (Signed)
Where is his pain coming from -okay for 20 tablets if he is having postop shoulder pain.  If this is other pain he should not really be taking opioids for it.  1 p.o. daily as needed pain.

## 2018-01-09 ENCOUNTER — Encounter: Payer: Self-pay | Admitting: Family Medicine

## 2018-01-09 DIAGNOSIS — R0609 Other forms of dyspnea: Secondary | ICD-10-CM | POA: Diagnosis not present

## 2018-01-09 DIAGNOSIS — R600 Localized edema: Secondary | ICD-10-CM | POA: Diagnosis not present

## 2018-01-09 MED ORDER — HYDROCODONE-ACETAMINOPHEN 10-325 MG PO TABS: ORAL_TABLET | ORAL | 0 refills | Status: DC

## 2018-01-09 NOTE — Telephone Encounter (Signed)
Ok for 20 tabs beyond that he will need to go to primary care and explain his pains are from other than his operative locations 1 po q d pls call thx

## 2018-01-09 NOTE — Telephone Encounter (Signed)
IC advised could pick up in the morning after signed by Dr [name] Sa

## 2018-01-09 NOTE — Addendum Note (Signed)
Addended byLaurann Montana on: 01/09/2018 12:54 PM   Modules accepted: Orders

## 2018-01-09 NOTE — Telephone Encounter (Signed)
IC s/w patient and he stated that he used it for different pains that he he has. He said mainly for his headaches and occasionally for his arm that has not had the surgery yet. Do you still want to fill?

## 2018-01-16 ENCOUNTER — Ambulatory Visit (INDEPENDENT_AMBULATORY_CARE_PROVIDER_SITE_OTHER): Payer: PPO | Admitting: Orthopedic Surgery

## 2018-01-16 ENCOUNTER — Ambulatory Visit (INDEPENDENT_AMBULATORY_CARE_PROVIDER_SITE_OTHER): Payer: PPO

## 2018-01-16 ENCOUNTER — Encounter (INDEPENDENT_AMBULATORY_CARE_PROVIDER_SITE_OTHER): Payer: Self-pay | Admitting: Orthopedic Surgery

## 2018-01-16 DIAGNOSIS — I209 Angina pectoris, unspecified: Secondary | ICD-10-CM | POA: Diagnosis not present

## 2018-01-16 DIAGNOSIS — R0609 Other forms of dyspnea: Secondary | ICD-10-CM | POA: Diagnosis not present

## 2018-01-16 DIAGNOSIS — M79604 Pain in right leg: Secondary | ICD-10-CM | POA: Diagnosis not present

## 2018-01-16 DIAGNOSIS — R600 Localized edema: Secondary | ICD-10-CM | POA: Diagnosis not present

## 2018-01-16 DIAGNOSIS — I10 Essential (primary) hypertension: Secondary | ICD-10-CM | POA: Diagnosis not present

## 2018-01-19 ENCOUNTER — Encounter (INDEPENDENT_AMBULATORY_CARE_PROVIDER_SITE_OTHER): Payer: Self-pay | Admitting: Orthopedic Surgery

## 2018-01-19 NOTE — Progress Notes (Signed)
Office Visit Note   Patient: Nicholas [name]           Date of Birth: 03/23/1951           MRN: [id number] Visit Date: 01/16/2018 Requested by: [name], MD [place], [place] [id number] PCP: [name], MD  Subjective: Chief Complaint  Patient presents with  . Right Shoulder - Pain    HPI: Nicholas [name] is a patient with right shoulder pain.  He is done well with his left shoulder replacement.  He has severe deformity in that right shoulder.  He also reports right leg pain.  He has had a right knee fusion due to arthritis in the knee.  He had a total knee which was infected and underwent knee effusion.  Since I have seen him he has seen a cardiologist and he has to have some type of stents placed within the next month.  This will prevent him from having shoulder surgery in the near future.  He also reports right leg pain but no fevers and chills.              ROS: All systems reviewed are negative as they relate to the chief complaint within the history of present illness.  Patient denies  fevers or chills.   Assessment & Plan: Visit Diagnoses:  1. Pain in right leg     Plan: Impression is right leg pain following fusion.  No evidence of stress fracture or stress reaction or bony destruction.  I think that something we can watch for now.  We can fix his right shoulder once he is over his heart procedure.  He will need cardiac risk stratification prior to his shoulder replacement.  We will also need a thin cut CT to evaluate prior to his reverse shoulder replacement.  Follow-Up Instructions: No follow-ups on file.   Orders:  Orders Placed This Encounter  Procedures  . XR Tibia/Fibula Right   No orders of the defined types were placed in this encounter.     Procedures: No procedures performed   Clinical Data: No additional findings.  Objective: Vital Signs: There were no vitals taken for this visit.  Physical Exam:   Constitutional: Patient  appears well-developed HEENT:  Head: Normocephalic Eyes:EOM are normal Neck: Normal range of motion Cardiovascular: Normal rate Pulmonary/chest: Effort normal Neurologic: Patient is alert Skin: Skin is warm Psychiatric: Patient has normal mood and affect    Ortho Exam: Orthopedic exam demonstrates full active and passive range of motion of cervical spine.  Right shoulder has predictably less range of motion with external rotation forward flexion and abduction in the left shoulder.  Left shoulder actually has forward flexion abduction above 90.  This is pain-free range of motion on the left but painful range of motion on the right.  Right leg is examined.  No warmth erythema in the right leg region pedal pulses palpable.  No masses lymphadenopathy or skin changes noted in the right leg region.  Specialty Comments:  No specialty comments available.  Imaging: No results found.   PMFS History: Patient Active Problem List   Diagnosis Date Noted  . Acute on chronic congestive heart failure ([place]) 12/27/2017  . OSA (obstructive sleep apnea) 12/27/2017  . Health maintenance examination 11/25/2017  . Renal insufficiency 10/01/2017  . Status post reverse total replacement of left shoulder 10/01/2017  . Fatty liver 10/01/2017  . Bilateral shoulder pain 05/24/2017  . History of CVA in adulthood 03/08/2017  .  S/P total knee arthroplasty, left 01/30/2017  . Arthritis of knee 01/15/2017  . Skin rash 12/10/2016  . Medicare annual wellness visit, initial 10/05/2016  . Advanced care planning/counseling discussion 10/05/2016  . Diarrhea 08/21/2016  . Severe obesity (BMI 35.0-39.9) with comorbidity ([place]) 07/19/2016  . Hypothyroidism   . IDA (iron deficiency anemia) 04/23/2016  . Chronic pain syndrome 04/23/2016  . Inflammatory arthritis 04/23/2016  . Chronic leg pain 04/23/2016  . Lesion of palate 04/23/2016  . Grieving 04/23/2016  . GERD (gastroesophageal reflux disease)   . Duodenitis  determined by biopsy 02/13/2016  . Diverticulosis of colon without hemorrhage 01/31/2016  . Chest pain 01/11/2012  . Migraine 01/11/2012  . History of methicillin resistant staphylococcus aureus (MRSA) 07/04/2010  . DEGENERATIVE DISC DISEASE 07/04/2010  . History of total right knee replacement 07/04/2010  . Osteoarthritis 06/09/2007  . Personal history of colonic adenoma 06/01/2003   Past Medical History:  Diagnosis Date  . Acute kidney failure 08/2008   "cleared up"  no problems since  . Allergy   . Anxiety   . Arthritis   . Asthma    ?of this no inhaler  . Blood transfusion   . CHF (congestive heart failure) ([place])    ?of this, pt denies  . CLOSTRIDIUM DIFFICILE COLITIS 07/04/2010   Annotation: 12/09, 2/10 Qualifier: Diagnosis of  By: [name], [name]    . Depression   . Depression with anxiety   . Duodenitis determined by biopsy 02/2016   peptic likely due to aleve (erosive gastropathy with duodenal erosions)  . ECZEMA 07/04/2010   Qualifier: Diagnosis of  By: [name], [name]    . Elevated liver enzymes   . GERD (gastroesophageal reflux disease)   . Heart attack ([place])    08/2008 (likely demand ischemia in the setting of MSSA sepsis/R TKA  infection)10-2008  . History of hiatal hernia   . History of stomach ulcers   . Hypothyroidism   . Lower GI bleeding   . MALAR AND MAXILLARY BONES CLOSED FRACTURE 07/04/2010   Annotation: ORIF Qualifier: Diagnosis of  By: [name], [name]    . Migraine    "definitely"  . MRSA (methicillin resistant Staphylococcus aureus)    in leg, had to place steel rod in leg  . Personal history of colonic adenoma 06/01/2003  . PFO (patent foramen ovale)    small PFO by 08/2008 TEE  . Pneumonia 08/2008   "while in ICU"  . Seizures ([place]) 2009   "long time ago"    . Stroke [facility]) 2010   unable to complete sentences at times    Family History  Problem Relation Age of Onset  . Coronary artery disease Father 25  . Stroke Father   . CAD Mother  20       stents  . Breast cancer Sister   . Diabetes Brother   . Diabetes Sister   . Colon cancer Neg Hx   . Stomach cancer Neg Hx     Past Surgical History:  Procedure Laterality Date  . anterior  nerve transposition  07/2009   left ulnar nerve  . antibiotic spacer exchange  11/2008; 08/2006   right knee  . ARTHROTOMY  08/2008   right knee w/I&D  . CARDIOVASCULAR STRESS TEST  2013   stress EKG - negative for ischemia, 4 min 7.3 METs, normal blood pressure response  . CATARACT EXTRACTION    . COLONOSCOPY  11/2012   diverticulosis, rpt 10 yrs [name])  . ESOPHAGOGASTRODUODENOSCOPY  02/2016   erosive gastropathy with duodenal erosions [name])  . HARDWARE REMOVAL  04/2006   right knee w/antibiotic spacers placed  . INGUINAL HERNIA REPAIR  early 1990's   bilateral  . JOINT REPLACEMENT Right    rt knee  . KNEE ARTHROSCOPY  10/2001   right  . KNEE FUSION  03/2009   right knee removal; antibiotic spacers;   . REPLACEMENT TOTAL KNEE  08/2008; 09/2001   right  . REVERSE SHOULDER ARTHROPLASTY Left 09/03/2017   Procedure: REVERSE SHOULDER ARTHROPLASTY;  Surgeon: Meredith Pel, MD;  Location: [facility];  Service: Orthopedics;  Laterality: Left;  . rod placement Right   . SYNOVECTOMY  06/2005   debridement, liner exchange right knee  . TOE AMPUTATION Left 08/2008   great toe - osteomyelitis (staph infection)  . TOE AMPUTATION Left 04/2016   2nd toe [name]  . TONSILLECTOMY    . TOTAL KNEE ARTHROPLASTY Left 01/15/2017  . TOTAL KNEE ARTHROPLASTY Left 01/15/2017   Procedure: TOTAL KNEE ARTHROPLASTY;  Surgeon: Meredith Pel, MD;  Location: [facility];  Service: Orthopedics;  Laterality: Left;   Social History   Occupational History  . Occupation: retired  Tobacco Use  . Smoking status: Never Smoker  . Smokeless tobacco: Never Used  Substance and Sexual Activity  . Alcohol use: No    Alcohol/week: 0.0 oz    Comment: "I abused alcohol; last drink  ~ 2005"  . Drug use: No  .  Sexual activity: Never

## 2018-01-28 DIAGNOSIS — I209 Angina pectoris, unspecified: Secondary | ICD-10-CM | POA: Diagnosis not present

## 2018-01-30 ENCOUNTER — Telehealth (INDEPENDENT_AMBULATORY_CARE_PROVIDER_SITE_OTHER): Payer: Self-pay | Admitting: Orthopedic Surgery

## 2018-01-30 NOTE — Telephone Encounter (Signed)
Patient requesting rx refill on Norco. CB # [phone]

## 2018-01-30 NOTE — Telephone Encounter (Signed)
Ok to rF? 
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
## 2018-02-04 ENCOUNTER — Encounter (HOSPITAL_COMMUNITY)
Admission: RE | Disposition: A | Discharge status: Home / Self Care | Payer: Self-pay | Source: Ambulatory Visit | Attending: Cardiology

## 2018-02-04 ENCOUNTER — Ambulatory Visit (HOSPITAL_COMMUNITY)
Admission: RE | Admit: 2018-02-04 | Discharge: 2018-02-04 | Disposition: A | Discharge status: Home / Self Care | Payer: PPO | Source: Ambulatory Visit | Attending: Cardiology | Admitting: Cardiology

## 2018-02-04 DIAGNOSIS — I083 Combined rheumatic disorders of mitral, aortic and tricuspid valves: Secondary | ICD-10-CM | POA: Diagnosis not present

## 2018-02-04 DIAGNOSIS — Z87828 Personal history of other (healed) physical injury and trauma: Secondary | ICD-10-CM | POA: Insufficient documentation

## 2018-02-04 DIAGNOSIS — Z89422 Acquired absence of other left toe(s): Secondary | ICD-10-CM | POA: Diagnosis not present

## 2018-02-04 DIAGNOSIS — Z7982 Long term (current) use of aspirin: Secondary | ICD-10-CM | POA: Diagnosis not present

## 2018-02-04 DIAGNOSIS — Z8249 Family history of ischemic heart disease and other diseases of the circulatory system: Secondary | ICD-10-CM | POA: Insufficient documentation

## 2018-02-04 DIAGNOSIS — I10 Essential (primary) hypertension: Secondary | ICD-10-CM | POA: Diagnosis not present

## 2018-02-04 DIAGNOSIS — Z9841 Cataract extraction status, right eye: Secondary | ICD-10-CM | POA: Diagnosis not present

## 2018-02-04 DIAGNOSIS — Z885 Allergy status to narcotic agent status: Secondary | ICD-10-CM | POA: Insufficient documentation

## 2018-02-04 DIAGNOSIS — Z79890 Hormone replacement therapy (postmenopausal): Secondary | ICD-10-CM | POA: Insufficient documentation

## 2018-02-04 DIAGNOSIS — F1011 Alcohol abuse, in remission: Secondary | ICD-10-CM | POA: Insufficient documentation

## 2018-02-04 DIAGNOSIS — Z79899 Other long term (current) drug therapy: Secondary | ICD-10-CM | POA: Insufficient documentation

## 2018-02-04 DIAGNOSIS — Z91040 Latex allergy status: Secondary | ICD-10-CM | POA: Diagnosis not present

## 2018-02-04 DIAGNOSIS — Z9842 Cataract extraction status, left eye: Secondary | ICD-10-CM | POA: Insufficient documentation

## 2018-02-04 DIAGNOSIS — R0789 Other chest pain: Secondary | ICD-10-CM | POA: Insufficient documentation

## 2018-02-04 DIAGNOSIS — Z886 Allergy status to analgesic agent status: Secondary | ICD-10-CM | POA: Diagnosis not present

## 2018-02-04 DIAGNOSIS — R079 Chest pain, unspecified: Secondary | ICD-10-CM | POA: Diagnosis present

## 2018-02-04 DIAGNOSIS — Z96653 Presence of artificial knee joint, bilateral: Secondary | ICD-10-CM | POA: Insufficient documentation

## 2018-02-04 DIAGNOSIS — R600 Localized edema: Secondary | ICD-10-CM | POA: Diagnosis not present

## 2018-02-04 DIAGNOSIS — R9431 Abnormal electrocardiogram [ECG] [EKG]: Secondary | ICD-10-CM | POA: Diagnosis not present

## 2018-02-04 HISTORY — PX: LEFT HEART CATH AND CORONARY ANGIOGRAPHY: CATH118249

## 2018-02-04 SURGERY — LEFT HEART CATH AND CORONARY ANGIOGRAPHY
Anesthesia: LOCAL

## 2018-02-04 MED ORDER — VERAPAMIL HCL 2.5 MG/ML IV SOLN
INTRAVENOUS | Status: DC | PRN
  Administered 2018-02-04: 5 mL via INTRA_ARTERIAL

## 2018-02-04 MED ORDER — NITROGLYCERIN 1 MG/10 ML FOR IR/CATH LAB
INTRA_ARTERIAL | Status: AC
  Filled 2018-02-04: qty 10

## 2018-02-04 MED ORDER — SODIUM CHLORIDE 0.9% FLUSH: 3.0000 mL | INTRAVENOUS | Status: DC | PRN

## 2018-02-04 MED ORDER — HEPARIN SODIUM (PORCINE) 1000 UNIT/ML IJ SOLN
INTRAMUSCULAR | Status: AC
  Filled 2018-02-04: qty 1

## 2018-02-04 MED ORDER — SODIUM CHLORIDE 0.9 % IV SOLN: 250.0000 mL | INTRAVENOUS | Status: DC | PRN

## 2018-02-04 MED ORDER — IOHEXOL 350 MG/ML SOLN
INTRAVENOUS | Status: DC | PRN
  Administered 2018-02-04: 70 mL via INTRA_ARTERIAL

## 2018-02-04 MED ORDER — SODIUM CHLORIDE 0.9 % IV SOLN: INTRAVENOUS | Status: AC

## 2018-02-04 MED ORDER — SODIUM CHLORIDE 0.9 % WEIGHT BASED INFUSION
3.0000 mL/kg/h | INTRAVENOUS | Status: DC
  Administered 2018-02-04: 3 mL/kg/h via INTRAVENOUS

## 2018-02-04 MED ORDER — FENTANYL CITRATE (PF) 100 MCG/2ML IJ SOLN
INTRAMUSCULAR | Status: AC
  Filled 2018-02-04: qty 2

## 2018-02-04 MED ORDER — VERAPAMIL HCL 2.5 MG/ML IV SOLN
INTRAVENOUS | Status: AC
  Filled 2018-02-04: qty 2

## 2018-02-04 MED ORDER — ACETAMINOPHEN 325 MG PO TABS: 650.0000 mg | ORAL_TABLET | ORAL | Status: DC | PRN

## 2018-02-04 MED ORDER — SODIUM CHLORIDE 0.9 % WEIGHT BASED INFUSION: 1.0000 mL/kg/h | INTRAVENOUS | Status: DC

## 2018-02-04 MED ORDER — ASPIRIN 81 MG PO CHEW: 81.0000 mg | CHEWABLE_TABLET | ORAL | Status: DC

## 2018-02-04 MED ORDER — LIDOCAINE HCL (PF) 1 % IJ SOLN
INTRAMUSCULAR | Status: DC | PRN
  Administered 2018-02-04: 2 mL via INTRADERMAL

## 2018-02-04 MED ORDER — HEPARIN SODIUM (PORCINE) 1000 UNIT/ML IJ SOLN
INTRAMUSCULAR | Status: DC | PRN
  Administered 2018-02-04: 6000 [IU] via INTRAVENOUS

## 2018-02-04 MED ORDER — SODIUM CHLORIDE 0.9% FLUSH: 3.0000 mL | Freq: Two times a day (BID) | INTRAVENOUS | Status: DC

## 2018-02-04 MED ORDER — FENTANYL CITRATE (PF) 100 MCG/2ML IJ SOLN
INTRAMUSCULAR | Status: DC | PRN
  Administered 2018-02-04: 50 ug via INTRAVENOUS

## 2018-02-04 MED ORDER — ONDANSETRON HCL 4 MG/2ML IJ SOLN: 4.0000 mg | Freq: Four times a day (QID) | INTRAMUSCULAR | Status: DC | PRN

## 2018-02-04 MED ORDER — MIDAZOLAM HCL 2 MG/2ML IJ SOLN
INTRAMUSCULAR | Status: DC | PRN
  Administered 2018-02-04: 1 mg via INTRAVENOUS

## 2018-02-04 MED ORDER — MIDAZOLAM HCL 2 MG/2ML IJ SOLN
INTRAMUSCULAR | Status: AC
  Filled 2018-02-04: qty 2

## 2018-02-04 MED ORDER — HEPARIN (PORCINE) IN NACL 2-0.9 UNITS/ML
INTRAMUSCULAR | Status: AC | PRN
  Administered 2018-02-04 (×2): 500 mL

## 2018-02-04 MED ORDER — LIDOCAINE HCL (PF) 1 % IJ SOLN
INTRAMUSCULAR | Status: AC
  Filled 2018-02-04: qty 30

## 2018-02-04 MED ORDER — HEPARIN (PORCINE) IN NACL 1000-0.9 UT/500ML-% IV SOLN
INTRAVENOUS | Status: AC
  Filled 2018-02-04: qty 1000

## 2018-02-04 SURGICAL SUPPLY — 14 items
CATH INFINITI 5 FR JL3.5 (CATHETERS) ×1 IMPLANT
CATH INFINITI 5FR ANG PIGTAIL (CATHETERS) ×1 IMPLANT
CATH INFINITI JR4 5F (CATHETERS) ×1 IMPLANT
COVER PRB 48X5XTLSCP FOLD TPE (BAG) IMPLANT
COVER PROBE 5X48 (BAG) ×2
DEVICE RAD COMP TR BAND LRG (VASCULAR PRODUCTS) ×1 IMPLANT
GLIDESHEATH SLEND A-KIT 6F 20G (SHEATH) ×1 IMPLANT
GUIDEWIRE INQWIRE 1.5J.035X260 (WIRE) IMPLANT
INQWIRE 1.5J .035X260CM (WIRE) ×2
KIT HEART LEFT (KITS) ×2 IMPLANT
PACK CARDIAC CATHETERIZATION (CUSTOM PROCEDURE TRAY) ×2 IMPLANT
SYR MEDRAD MARK V 150ML (SYRINGE) ×1 IMPLANT
TRANSDUCER W/STOPCOCK (MISCELLANEOUS) ×2 IMPLANT
TUBING CIL FLEX 10 FLL-RA (TUBING) ×2 IMPLANT

## 2018-02-04 NOTE — Discharge Instructions (Signed)
**Note -identified via Obfuscation** Radial Site Care °Refer to this sheet in the next few weeks. These instructions provide you with information about caring for yourself after your procedure. Your health care provider may also give you more specific instructions. Your treatment has been planned according to current medical practices, but problems sometimes occur. Call your health care provider if you have any problems or questions after your procedure. °What can I expect after the procedure? °After your procedure, it is typical to have the following: °· Bruising at the radial site that usually fades within 1-2 weeks. °· Blood collecting in the tissue (hematoma) that may be painful to the touch. It should usually decrease in size and tenderness within 1-2 weeks. ° °Follow these instructions at home: °· Take medicines only as directed by your health care provider. °· You may shower 24-48 hours after the procedure or as directed by your health care provider. Remove the bandage (dressing) and gently wash the site with plain soap and water. Pat the area dry with a clean towel. Do not rub the site, because this may cause bleeding. °· Do not take baths, swim, or use a hot tub until your health care provider approves. °· Check your insertion site every day for redness, swelling, or drainage. °· Do not apply powder or lotion to the site. °· Do not flex or bend the affected arm for 24 hours or as directed by your health care provider. °· Do not push or pull heavy objects with the affected arm for 24 hours or as directed by your health care provider. °· Do not lift over 10 lb (4.5 kg) for 5 days after your procedure or as directed by your health care provider. °· Ask your health care provider when it is okay to: °? Return to work or school. °? Resume usual physical activities or sports. °? Resume sexual activity. °· Do not drive home if you are discharged the same day as the procedure. Have someone else drive you. °· You may drive 24 hours after the procedure  unless otherwise instructed by your health care provider. °· Do not operate machinery or power tools for 24 hours after the procedure. °· If your procedure was done as an outpatient procedure, which means that you went home the same day as your procedure, a responsible adult should be with you for the first 24 hours after you arrive home. °· Keep all follow-up visits as directed by your health care provider. This is important. °Contact a health care provider if: °· You have a fever. °· You have chills. °· You have increased bleeding from the radial site. Hold pressure on the site. °Get help right away if: °· You have unusual pain at the radial site. °· You have redness, warmth, or swelling at the radial site. °· You have drainage (other than a small amount of blood on the dressing) from the radial site. °· The radial site is bleeding, and the bleeding does not stop after 30 minutes of holding steady pressure on the site. °· Your arm or hand becomes pale, cool, tingly, or numb. °This information is not intended to replace advice given to you by your health care provider. Make sure you discuss any questions you have with your health care provider. °Document Released: 11/03/2010 Document Revised: 03/08/2016 Document Reviewed: 04/19/2014 °[employer] Interactive Patient Education © 2018 [employer] ° °

## 2018-02-04 NOTE — Interval H&P Note (Signed)
History and Physical Interval Note:  02/04/2018 11:09 AM  Nicholas [name]  has presented today for surgery, with the diagnosis of angina. cp  The various methods of treatment have been discussed with the patient and family. After consideration of risks, benefits and other options for treatment, the patient has consented to  Procedure(s): LEFT HEART CATH AND CORONARY ANGIOGRAPHY (N/A) and possible PCI  as a surgical intervention .  The patient's history has been reviewed, patient examined, no change in status, stable for surgery.  I have reviewed the patient's chart and labs.  Questions were answered to the patient's satisfaction.   Symptom Status: Ischemic Symptoms Non-invasive Testing: Indeterminate If no or indeterminate stress test, FFR/iFR results in all diseased vessels: Not done Diabetes Mellitus: No S/P CABG: No Antianginal therapy (number of long-acting drugs): 1 Patient undergoing renal transplant: No Patient undergoing percutaneous valve procedure: No   1 Vessel Disease No proximal LAD involvement, No proximal left dominant LCX involvement  PCI: Not rated  CABG: Not rated Proximal left dominant LCX involvement  PCI: Not rated  CABG: Not rated Proximal LAD involvement  PCI: Not rated  CABG: Not rated  2 Vessel Disease No proximal LAD involvement  PCI: Not rated  CABG: Not rated Proximal LAD involvement  PCI: Not rated  CABG: Not rated  3 Vessel Disease Low disease complexity (e.g., focal stenoses, SYNTAX <=22)  PCI: Not rated  CABG: Not rated Intermediate or high disease complexity (e.g., SYNTAX >=23)  PCI: Not rated  CABG: Not rated  Left Main Disease Isolated LMCA disease: ostial or midshaft  PCI: A (7);  Indication 24  CABG: A (9);  Indication 24 Isolated LMCA disease: bifurcation involvement  PCI: M (5);  Indication 25  CABG: A (9);  Indication 25 LMCA ostial or midshaft, concurrent low disease burden multivessel disease (e.g., 1-2 additional focal  stenoses, SYNTAX <=22)  PCI: A (7);  Indication 26  CABG: A (9);  Indication 26 LMCA ostial or midshaft, concurrent intermediate or high disease burden multivessel disease (e.g., 1-2 additional bifurcation stenoses, long stenoses, SYNTAX >=23)  PCI: M (4);  Indication 27  CABG: A (9);  Indication 27 LMCA bifurcation involvement, concurrent low disease burden multivessel disease (e.g., 1-2 additional focal stenoses, SYNTAX <=22)  PCI: M (5);  Indication 28  CABG: A (9);  Indication 28 LMCA bifurcation involvement, concurrent intermediate or high disease burden multivessel disease (e.g., 1-2 additional bifurcation stenoses, long stenoses, SYNTAX >=23)  PCI: R (3);  Indication 29  CABG: A (9);  Indication 29  Notes:  A indicates appropriate. M indicates may be appropriate. R indicates rarely appropriate. Number in parentheses is median score for that indication. Reclassify indicates number of functionally diseased vessels should be decreased given negative FFR/iFR. Re-evaluate the scenario interpreting any FFR/iFR negative vessel as being not significantly stenosed.  Disease means involved vessel provides flow to a sufficient amount of myocardium to be clinically important.  If FFR testing indicates a vessel is not significant, that vessel should not be considered diseased (and the patient should be reclassified with respect to extent of functionally significant disease).  Proximal LAD + proximal left dominant LCX is considered 3 vessel CAD  2 Vessel CAD with FFR/iFR abnormal in only 1 but not both is considered 1 vessel CAD  Disease complexity includes occlusion, bifurcation, trifurcation, ostial, >20 mm, tortuosity, calcification, thrombus  LMCA disease is >=50% by angiography, MLD <2.8 mm, MLA <6 mm2; MLA 6-7.5 mm2 requires further physiologic  See Table  B for risk stratification based on noninvasive testing  Journal of the [employer] of Cardiology Mar 2017, 23391; DOI:  10.1016/j.jacc.2017.02.001 [employer].de.[email or web address].full-text.pdf This App  2018 by the Society for Cardiovascular Angiography and Interventions  Adrian Prows

## 2018-02-04 NOTE — Telephone Encounter (Signed)
Lets wait.

## 2018-02-04 NOTE — Telephone Encounter (Signed)
IC advised.  

## 2018-02-05 ENCOUNTER — Encounter (HOSPITAL_COMMUNITY): Payer: Self-pay | Admitting: Cardiology

## 2018-02-05 MED FILL — Heparin Sod (Porcine)-NaCl IV Soln 1000 Unit/500ML-0.9%: INTRAVENOUS | Qty: 1000 | Status: AC

## 2018-02-06 ENCOUNTER — Encounter: Payer: Self-pay | Admitting: Family Medicine

## 2018-02-13 DIAGNOSIS — R600 Localized edema: Secondary | ICD-10-CM | POA: Diagnosis not present

## 2018-02-13 DIAGNOSIS — I10 Essential (primary) hypertension: Secondary | ICD-10-CM | POA: Diagnosis not present

## 2018-02-13 DIAGNOSIS — R0609 Other forms of dyspnea: Secondary | ICD-10-CM | POA: Diagnosis not present

## 2018-02-13 DIAGNOSIS — R0789 Other chest pain: Secondary | ICD-10-CM | POA: Diagnosis not present

## 2018-02-20 DIAGNOSIS — H52203 Unspecified astigmatism, bilateral: Secondary | ICD-10-CM | POA: Diagnosis not present

## 2018-02-20 DIAGNOSIS — H524 Presbyopia: Secondary | ICD-10-CM | POA: Diagnosis not present

## 2018-02-25 ENCOUNTER — Ambulatory Visit (INDEPENDENT_AMBULATORY_CARE_PROVIDER_SITE_OTHER): Payer: PPO | Admitting: Family Medicine

## 2018-02-25 ENCOUNTER — Encounter: Payer: Self-pay | Admitting: Family Medicine

## 2018-02-25 VITALS — BP 120/76 | HR 74 | Temp 97.8°F | Ht 69.0 in | Wt 261.8 lb

## 2018-02-25 DIAGNOSIS — K2100 Gastro-esophageal reflux disease with esophagitis, without bleeding: Secondary | ICD-10-CM

## 2018-02-25 DIAGNOSIS — K2980 Duodenitis without bleeding: Secondary | ICD-10-CM

## 2018-02-25 DIAGNOSIS — I509 Heart failure, unspecified: Secondary | ICD-10-CM

## 2018-02-25 DIAGNOSIS — G8929 Other chronic pain: Secondary | ICD-10-CM

## 2018-02-25 DIAGNOSIS — M25511 Pain in right shoulder: Secondary | ICD-10-CM

## 2018-02-25 DIAGNOSIS — R1013 Epigastric pain: Secondary | ICD-10-CM

## 2018-02-25 DIAGNOSIS — E039 Hypothyroidism, unspecified: Secondary | ICD-10-CM | POA: Diagnosis not present

## 2018-02-25 DIAGNOSIS — M25512 Pain in left shoulder: Secondary | ICD-10-CM

## 2018-02-25 DIAGNOSIS — R079 Chest pain, unspecified: Secondary | ICD-10-CM

## 2018-02-25 LAB — LIPASE: Lipase: 14 U/L (ref 11.0–59.0)

## 2018-02-25 LAB — TSH: TSH: 8.24 u[IU]/mL — ABNORMAL HIGH (ref 0.35–4.50)

## 2018-02-25 MED ORDER — RANITIDINE HCL 150 MG PO TABS: 150.0000 mg | ORAL_TABLET | Freq: Every day | ORAL | 3 refills | Status: DC

## 2018-02-25 NOTE — Progress Notes (Signed)
BP 120/76 (BP Location: Left Arm, Patient Position: Sitting, Cuff Size: Normal)   Pulse 74   Temp 97.8 F (36.6 C) (Oral)   Ht 5\' 9"  (1.753 m)   Wt 261 lb 12 oz (118.7 kg)   SpO2 96%   BMI 38.65 kg/m    CC: hosp f/u visit Subjective:    Patient ID: Nicholas [name], male    DOB: 09-Jan-1951, 67 y.o.   MRN: [id number]  HPI: Nicholas [name] is a 67 y.o. male presenting on 02/25/2018 for Hospitalization Follow-up (Here for follow-up after Dr. Einar Gip and procedure on 02/04/18.); Abdominal Pain (States he still has pain in epigastric area.); and Needs letter (Requests letter to be excused from jury duty.)   Recent cardiac evaluation with abnormal nuclear stress test. Subsequent L heart catheterization revealed normal coronary arteries without significant CAD, normal LVEF, cleared to proceed with upcoming shoulder surgery. Planned cards f/u in 6 months.   Ongoing dull epigastric pressure. Denies significant GERD symptoms or burning discomfort. No other abd discomfort, nausea/vomiting, not food related. No aggravating or alleviating factors. No boring pain to back. Pain tends to come on worse at night time.   Takes protonix 40mg  daily.   Has had 2 falls out of bed. Staying with sister.  Noticing muscle twitching even when relaxed. Requests jury duty excuse letter.   Relevant past medical, surgical, family and social history reviewed and updated as indicated. Interim medical history since our last visit reviewed. Allergies and medications reviewed and updated. Outpatient Medications Prior to Visit  Medication Sig Dispense Refill  . acetaminophen (TYLENOL) 500 MG tablet Take 1,000 mg by mouth 2 (two) times daily.    [name] amitriptyline (ELAVIL) 50 MG tablet Take 2 tablets (100 mg total) by mouth at bedtime. 60 tablet 1  . aspirin 81 MG tablet Take 1 tablet (81 mg total) by mouth daily.    . diphenhydrAMINE (BENADRYL) 25 mg capsule Take 50 mg by mouth at bedtime.    . ferrous sulfate 325 (65 FE) MG  EC tablet Take 1 tablet (325 mg total) by mouth daily with breakfast.  3  . furosemide (LASIX) 20 MG tablet Take 1 tablet (20 mg total) by mouth daily as needed for fluid or edema. (Patient taking differently: Take 20 mg by mouth daily. ) 30 tablet 3  . gabapentin (NEURONTIN) 300 MG capsule TAKE ONE CAPSULE BY MOUTH 4 TIMES A DAY (Patient taking differently: Take 300 mg by mouth in the morning, take 600 mg by mouth in the afternoon and take 300 mg by mouth at bedtime) 360 capsule 1  . HYDROcodone-acetaminophen (NORCO) 10-325 MG tablet 1 po q d prn pain 20 tablet 0  . hydrocortisone 2.5 % cream Apply 1 application topically at bedtime as needed (for rash).     [name] levothyroxine (SYNTHROID, LEVOTHROID) 75 MCG tablet Take 1 tablet (75 mcg total) by mouth daily before breakfast. 90 tablet 1  . metoprolol succinate (TOPROL-XL) 100 MG 24 hr tablet Take 100 mg by mouth daily. Take with or immediately following a meal.    . Multiple Vitamins-Minerals (CENTRUM SILVER PO) Take 1 tablet daily by mouth.    . nitroGLYCERIN (NITROSTAT) 0.4 MG SL tablet Place 0.4 mg under the tongue every 5 (five) minutes as needed for chest pain.    . pantoprazole (PROTONIX) 40 MG tablet TAKE 1 TABLET EVERY DAY (Patient taking differently: Take 40 mg by mouth daily) 30 tablet 11  . Probiotic Product (PROBIOTIC PO) Take 1  capsule daily by mouth.    . SUMAtriptan (IMITREX) 100 MG tablet Take 1 tablet (100 mg total) by mouth once for 1 dose. May repeat in 2 hours if headache persists or recurs. (Patient taking differently: Take 100 mg by mouth every 2 (two) hours as needed for migraine or headache. May repeat in 2 hours if headache persists or recurs.) 12 tablet 3   No facility-administered medications prior to visit.      Per HPI unless specifically indicated in ROS section below Review of Systems     Objective:    BP 120/76 (BP Location: Left Arm, Patient Position: Sitting, Cuff Size: Normal)   Pulse 74   Temp 97.8 F (36.6  C) (Oral)   Ht 5\' 9"  (1.753 m)   Wt 261 lb 12 oz (118.7 kg)   SpO2 96%   BMI 38.65 kg/m   Wt Readings from Last 3 Encounters:  02/25/18 261 lb 12 oz (118.7 kg)  02/04/18 262 lb (118.8 kg)  12/27/17 274 lb (124.3 kg)    Physical Exam  Constitutional: He appears well-developed and well-nourished. He does not appear ill.  HENT:  Mouth/Throat: Oropharynx is clear and moist. No oropharyngeal exudate.  Eyes: Pupils are equal, round, and reactive to light. EOM are normal.  Cardiovascular: Normal rate and normal heart sounds.  No murmur heard. Pulmonary/Chest: Effort normal and breath sounds normal. No respiratory distress. He has no wheezes. He has no rhonchi. He has no rales.  Abdominal: Soft. Normal appearance, normal aorta and bowel sounds are normal. He exhibits no distension and no mass. There is no hepatosplenomegaly. There is tenderness in the epigastric area. There is no rebound, no guarding and negative Murphy's sign.  Nursing note and vitals reviewed.  Results for orders placed or performed in visit on 02/25/18  TSH  Result Value Ref Range   TSH 8.24 (H) 0.35 - 4.50 uIU/mL  Lipase  Result Value Ref Range   Lipase 14.0 11.0 - 59.0 U/L   Lab Results  Component Value Date   CREATININE 1.26 12/27/2017   BUN 18 12/27/2017   NA 138 12/27/2017   K 4.1 12/27/2017   CL 104 12/27/2017   CO2 29 12/27/2017    Lab Results  Component Value Date   ALT 14 12/27/2017   AST 17 12/27/2017   ALKPHOS 90 12/27/2017   BILITOT 1.2 12/27/2017    Lab Results  Component Value Date   WBC 8.0 12/27/2017   HGB 13.4 12/27/2017   HCT 40.3 12/27/2017   MCV 85.7 12/27/2017   PLT 286.0 12/27/2017       Assessment & Plan:  [place] duty letter written and will mail to patient.  Problem List Items Addressed This Visit    Acute on chronic congestive heart failure (HCC)    Continue lasix 20mg  daily.       Bilateral shoulder pain    Discussing R shoulder replacement.       Chest pain     Recent cardiac evaluation reassuring. Has been cleared by cardiology for upcoming orthopedic procedure.      Duodenitis determined by biopsy    H/o this 2017.      Epigastric pain - Primary    Persistent dull epigastric discomfort despite daily PPI. Recent cardiac evaluation reassuring. ?GERD related - will add H2 antihistamine to daily PPI and I asked him to update [place] in 2 weeks. ?pancreatitis - check lipase.       Relevant Orders   Lipase (Completed)  GERD (gastroesophageal reflux disease)    Chronic, possibly contributing to epigastric discomfort despite daily PPI - add nightly zantac.       Relevant Medications   ranitidine (ZANTAC) 150 MG tablet   Hypothyroidism    Update TSH on higher levothyroxine dose 49mcg      Relevant Orders   TSH (Completed)       Meds ordered this encounter  Medications  . ranitidine (ZANTAC) 150 MG tablet    Sig: Take 1 tablet (150 mg total) by mouth at bedtime.    Dispense:  30 tablet    Refill:  3   Orders Placed This Encounter  Procedures  . TSH  . Lipase    Follow up plan: No follow-ups on file.  [name], MD

## 2018-02-25 NOTE — Patient Instructions (Signed)
Labs today. Pain may be coming from GI - GERD or gastritis.  Continue pantoprazole 40mg  daily, add ranitidine or zantac 150mg  nightly. Update me with effect after 2 weeks.

## 2018-02-26 ENCOUNTER — Encounter: Payer: Self-pay | Admitting: Family Medicine

## 2018-02-26 ENCOUNTER — Ambulatory Visit (INDEPENDENT_AMBULATORY_CARE_PROVIDER_SITE_OTHER): Payer: PPO | Admitting: Orthopedic Surgery

## 2018-02-26 ENCOUNTER — Telehealth: Payer: Self-pay

## 2018-02-26 ENCOUNTER — Other Ambulatory Visit: Payer: Self-pay | Admitting: Family Medicine

## 2018-02-26 ENCOUNTER — Encounter (INDEPENDENT_AMBULATORY_CARE_PROVIDER_SITE_OTHER): Payer: Self-pay | Admitting: Orthopedic Surgery

## 2018-02-26 DIAGNOSIS — M19019 Primary osteoarthritis, unspecified shoulder: Secondary | ICD-10-CM | POA: Diagnosis not present

## 2018-02-26 DIAGNOSIS — R1013 Epigastric pain: Secondary | ICD-10-CM | POA: Insufficient documentation

## 2018-02-26 MED ORDER — LEVOTHYROXINE SODIUM 88 MCG PO TABS: 88.0000 ug | ORAL_TABLET | Freq: Every day | ORAL | 1 refills | Status: DC

## 2018-02-26 NOTE — Assessment & Plan Note (Signed)
Update TSH on higher levothyroxine dose 50mcg

## 2018-02-26 NOTE — Assessment & Plan Note (Addendum)
Persistent dull epigastric discomfort despite daily PPI. Recent cardiac evaluation reassuring. ?GERD related - will add H2 antihistamine to daily PPI and I asked him to update [place] in 2 weeks. ?pancreatitis - check lipase.

## 2018-02-26 NOTE — Telephone Encounter (Signed)
Spoke with pt notifying him Dr. Darnell Level finished his letter to excuse him from jury duty.  Pt requests letter be mailed to him.  Mailed letter to pt as requested.

## 2018-02-26 NOTE — Progress Notes (Signed)
Office Visit Note   Patient: Nicholas [name]           Date of Birth: 1951/08/23           MRN: [id number] Visit Date: 02/26/2018 Requested by: [name], MD [place], [place] [id number] PCP: [name], MD  Subjective: No chief complaint on file.   HPI: Nicholas [name] is a 67 year old patient with right shoulder pain.  Had end-stage severe left shoulder arthritis treated with reverse shoulder replacement over a year ago.  He is done very well with his left shoulder does not report pain in the shoulder.  Right shoulder.  He was recently seen by his cardiologist who did not find any cardiac issues requiring further work-up.              ROS: All systems reviewed are negative as they relate to the chief complaint within the history of present illness.  Patient denies  fevers or chills.   Assessment & Plan: Visit Diagnoses:  1. Shoulder arthritis     Plan: Impression is right shoulder arthritis with favorable cardiac risk stratification.  Plan is right shoulder replacement utilizing comparable components to his left side.  That would include [employer] patient specific instrumentation for augmented glenoid baseplate reverse shoulder replacement.  Risk and benefits are discussed including but not limited to infection nerve vessel damage incomplete healing as well as potential for further surgery.  Patient understands risk benefits.  All questions answered.  Follow-Up Instructions: No follow-ups on file.   Orders:  No orders of the defined types were placed in this encounter.  No orders of the defined types were placed in this encounter.     Procedures: No procedures performed   Clinical Data: No additional findings.  Objective: Vital Signs: There were no vitals taken for this visit.  Physical Exam:   Constitutional: Patient appears well-developed HEENT:  Head: Normocephalic Eyes:EOM are normal Neck: Normal range of motion Cardiovascular: Normal  rate Pulmonary/chest: Effort normal Neurologic: Patient is alert Skin: Skin is warm Psychiatric: Patient has normal mood and affect    Ortho Exam: Orthopedic exam demonstrates external rotation of 15 degrees of abduction to about degrees on the right-hand side.  Isolated forward flexion and abduction both below 90 degrees.  Motor or sensory function to the hand is intact.  Skin in the shoulder girdle region is intact.  No other masses lymphadenopathy or skin changes noted in the shoulder region.  Radial pulse is intact.  Specialty Comments:  No specialty comments available.  Imaging: No results found.   PMFS History: Patient Active Problem List   Diagnosis Date Noted  . Epigastric pain 02/26/2018  . Acute on chronic congestive heart failure ([place]) 12/27/2017  . OSA (obstructive sleep apnea) 12/27/2017  . Health maintenance examination 11/25/2017  . Renal insufficiency 10/01/2017  . Status post reverse total replacement of left shoulder 10/01/2017  . Fatty liver 10/01/2017  . Bilateral shoulder pain 05/24/2017  . History of CVA in adulthood 03/08/2017  . S/P total knee arthroplasty, left 01/30/2017  . Arthritis of knee 01/15/2017  . Skin rash 12/10/2016  . Medicare annual wellness visit, initial 10/05/2016  . Advanced care planning/counseling discussion 10/05/2016  . Severe obesity (BMI 35.0-39.9) with comorbidity ([place]) 07/19/2016  . Hypothyroidism   . IDA (iron deficiency anemia) 04/23/2016  . Chronic pain syndrome 04/23/2016  . Inflammatory arthritis 04/23/2016  . Chronic leg pain 04/23/2016  . Lesion of palate 04/23/2016  . Grieving 04/23/2016  .  GERD (gastroesophageal reflux disease)   . Duodenitis determined by biopsy 02/13/2016  . Diverticulosis of colon without hemorrhage 01/31/2016  . Chest pain 01/11/2012  . Migraine 01/11/2012  . History of methicillin resistant staphylococcus aureus (MRSA) 07/04/2010  . DEGENERATIVE DISC DISEASE 07/04/2010  . History of total  right knee replacement 07/04/2010  . Osteoarthritis 06/09/2007  . Personal history of colonic adenoma 06/01/2003   Past Medical History:  Diagnosis Date  . Acute kidney failure 08/2008   "cleared up"  no problems since  . Allergy   . Anxiety   . Arthritis   . Asthma    ?of this no inhaler  . Blood transfusion   . CHF (congestive heart failure) ([place])    ?of this, pt denies  . CLOSTRIDIUM DIFFICILE COLITIS 07/04/2010   Annotation: 12/09, 2/10 Qualifier: Diagnosis of  By: [name], [name]    . Depression   . Depression with anxiety   . Duodenitis determined by biopsy 02/2016   peptic likely due to aleve (erosive gastropathy with duodenal erosions)  . ECZEMA 07/04/2010   Qualifier: Diagnosis of  By: [name], [name]    . Elevated liver enzymes   . GERD (gastroesophageal reflux disease)   . Heart attack ([place])    08/2008 (likely demand ischemia in the setting of MSSA sepsis/R TKA  infection)10-2008  . History of hiatal hernia   . History of stomach ulcers   . Hypothyroidism   . Lower GI bleeding   . MALAR AND MAXILLARY BONES CLOSED FRACTURE 07/04/2010   Annotation: ORIF Qualifier: Diagnosis of  By: [name], [name]    . Migraine    "definitely"  . MRSA (methicillin resistant Staphylococcus aureus)    in leg, had to place steel rod in leg  . Personal history of colonic adenoma 06/01/2003  . PFO (patent foramen ovale)    small PFO by 08/2008 TEE  . Pneumonia 08/2008   "while in ICU"  . Seizures ([place]) 2009   "long time ago"    . Stroke [facility]) 2010   unable to complete sentences at times    Family History  Problem Relation Age of Onset  . Coronary artery disease Father 59  . Stroke Father   . CAD Mother 21       stents  . Breast cancer Sister   . Diabetes Brother   . Diabetes Sister   . Colon cancer Neg Hx   . Stomach cancer Neg Hx     Past Surgical History:  Procedure Laterality Date  . anterior  nerve transposition  07/2009   left ulnar nerve  . antibiotic spacer  exchange  11/2008; 08/2006   right knee  . ARTHROTOMY  08/2008   right knee w/I&D  . CARDIOVASCULAR STRESS TEST  2013   stress EKG - negative for ischemia, 4 min 7.3 METs, normal blood pressure response  . CATARACT EXTRACTION    . COLONOSCOPY  11/2012   diverticulosis, rpt 10 yrs [name])  . ESOPHAGOGASTRODUODENOSCOPY  02/2016   erosive gastropathy with duodenal erosions [name])  . HARDWARE REMOVAL  04/2006   right knee w/antibiotic spacers placed  . INGUINAL HERNIA REPAIR  early 1990's   bilateral  . JOINT REPLACEMENT Right    rt knee  . KNEE ARTHROSCOPY  10/2001   right  . KNEE FUSION  03/2009   right knee removal; antibiotic spacers;   . LEFT HEART CATH AND CORONARY ANGIOGRAPHY N/A 02/04/2018    WNL (Patwardhan, Reynold Bowen, MD)  .  REPLACEMENT TOTAL KNEE  08/2008; 09/2001   right  . REVERSE SHOULDER ARTHROPLASTY Left 09/03/2017   Procedure: REVERSE SHOULDER ARTHROPLASTY;  Surgeon: Meredith Pel, MD;  Location: [facility];  Service: Orthopedics;  Laterality: Left;  . rod placement Right   . SYNOVECTOMY  06/2005   debridement, liner exchange right knee  . TOE AMPUTATION Left 08/2008   great toe - osteomyelitis (staph infection)  . TOE AMPUTATION Left 04/2016   2nd toe [name]  . TONSILLECTOMY    . TOTAL KNEE ARTHROPLASTY Left 01/15/2017  . TOTAL KNEE ARTHROPLASTY Left 01/15/2017   Procedure: TOTAL KNEE ARTHROPLASTY;  Surgeon: Meredith Pel, MD;  Location: [facility];  Service: Orthopedics;  Laterality: Left;   Social History   Occupational History  . Occupation: retired  Tobacco Use  . Smoking status: Never Smoker  . Smokeless tobacco: Never Used  Substance and Sexual Activity  . Alcohol use: No    Alcohol/week: 0.0 oz    Comment: "I abused alcohol; last drink  ~ 2005"  . Drug use: No  . Sexual activity: Never

## 2018-02-26 NOTE — Assessment & Plan Note (Signed)
Recent cardiac evaluation reassuring. Has been cleared by cardiology for upcoming orthopedic procedure.

## 2018-02-26 NOTE — Assessment & Plan Note (Addendum)
H/o this 2017.

## 2018-02-26 NOTE — Assessment & Plan Note (Signed)
Discussing R shoulder replacement.

## 2018-02-26 NOTE — Assessment & Plan Note (Signed)
Chronic, possibly contributing to epigastric discomfort despite daily PPI - add nightly zantac.

## 2018-02-26 NOTE — Assessment & Plan Note (Signed)
-   Continue lasix 20mg daily

## 2018-03-11 ENCOUNTER — Telehealth (INDEPENDENT_AMBULATORY_CARE_PROVIDER_SITE_OTHER): Payer: Self-pay | Admitting: Orthopedic Surgery

## 2018-03-11 DIAGNOSIS — M19019 Primary osteoarthritis, unspecified shoulder: Secondary | ICD-10-CM

## 2018-03-11 NOTE — Telephone Encounter (Signed)
Patient would like to schedule the Right Reverse Shoulder Replacement.  I have the surgery sheet with a note to call Josh for PSI guide, but did not see an order (referral) for the CT Scan?  He  had the left shoulder done in 2018 and that's last order that I could find.

## 2018-03-12 NOTE — Telephone Encounter (Signed)
I put in order for CT scan.

## 2018-03-12 NOTE — Telephone Encounter (Signed)
Do you want me to order thin cut for preop for his right shoulder?

## 2018-03-12 NOTE — Telephone Encounter (Signed)
Y thx

## 2018-03-20 ENCOUNTER — Encounter: Payer: Self-pay | Admitting: Family Medicine

## 2018-03-20 ENCOUNTER — Ambulatory Visit
Admission: RE | Admit: 2018-03-20 | Discharge: 2018-03-20 | Disposition: A | Discharge status: Home / Self Care | Payer: PPO | Source: Ambulatory Visit | Attending: Family Medicine | Admitting: Family Medicine

## 2018-03-20 ENCOUNTER — Ambulatory Visit (INDEPENDENT_AMBULATORY_CARE_PROVIDER_SITE_OTHER): Payer: PPO | Admitting: Family Medicine

## 2018-03-20 ENCOUNTER — Telehealth: Payer: Self-pay | Admitting: *Deleted

## 2018-03-20 VITALS — BP 102/58 | HR 66 | Temp 97.6°F | Ht 69.0 in | Wt 262.5 lb

## 2018-03-20 DIAGNOSIS — M79661 Pain in right lower leg: Secondary | ICD-10-CM | POA: Diagnosis not present

## 2018-03-20 DIAGNOSIS — R21 Rash and other nonspecific skin eruption: Secondary | ICD-10-CM

## 2018-03-20 DIAGNOSIS — M7989 Other specified soft tissue disorders: Secondary | ICD-10-CM | POA: Diagnosis not present

## 2018-03-20 MED ORDER — TRIAMCINOLONE ACETONIDE 0.5 % EX CREA: 1.0000 "application " | TOPICAL_CREAM | Freq: Two times a day (BID) | CUTANEOUS | 0 refills | Status: DC

## 2018-03-20 NOTE — Patient Instructions (Addendum)
Start topical steroid cream for eczema like rash.  Please stop at the front desk to set up referral.

## 2018-03-20 NOTE — Telephone Encounter (Signed)
Patient walked into the office complaining of leg pain with a red spot. Patient was scheduled with Dr. Diona Browner at 3:00. Talked to patient and was advised that he had knee replacement years ago and has had problems since the surgery. Patient stated that he has a sore on his leg that has been there for a while and has used creams on it to clear it up, but it keeps coming back. Patient stated that he started with pain in the area about a week ago and it has gotten worse in the last 2-3 days.  Spoke to Dr. Danise Mina and was advised if patient is in a lot of pain he should go to the ER if he does not feel that he can wait until 3:00. Patient stated that this has been going on for a while and does not feel that he needs to go the the ER and will come back for the appointment today. Patient stated that he is not having any SOB or any other symptoms. Advised patient that if he gets worse before the appointment this afternoon to go to the ER and he verbalized understanding.

## 2018-03-20 NOTE — Progress Notes (Signed)
   Subjective:    Patient ID: Nicholas [name], male    DOB: 14-Apr-1951, 67 y.o.   MRN: [id number]  HPI   67 year old  Male pot of Dr. Darnell Level with history of  chronic pain presents with new onset  Right leg pain.   He reports  1 month ago he fell after tripping over a tree. No open would. He had pain in lower leg.   He has had rash in right lower leg x months. Started light red patch, dry skin. No blisters Worse following fall.. No sure if fel into poison ivy. Rash is very itchy.  Has tried treating with hydrocortisone 2.5 %.. Does not help much.  [place] out of bed 4-5 days ago. Was sleeping in different bed, too small.   Does have chronic leg pain, but it is worse since recent falls. Pain worse in anterior leg, but calf also painful  X-ray at ortho of right leg.. nml 2 months ago. Now in last  Few days .   Right leg somewhat more swollen than left.  He has been less active in last few weeks. Sitting around more.  Nonsmoker. No recent long car trips plane flights.  He is unable to bend knee since right knee surgery in 2010 following MRSA complication.  Nonsmoker. Social History /Family History/Past Medical History reviewed in detail and updated in EMR if needed. Blood pressure (!) 102/58, pulse 66, temperature 97.6 F (36.4 C), temperature source Oral, height 5\' 9"  (1.753 m), weight 262 lb 8 oz (119.1 kg), SpO2 94 %.  Review of Systems  Constitutional: Negative for fatigue and fever.  HENT: Negative for ear pain.   Eyes: Negative for pain.  Respiratory: Negative for cough and shortness of breath.   Cardiovascular: Negative for chest pain, palpitations and leg swelling.  Gastrointestinal: Negative for abdominal pain.  Genitourinary: Negative for dysuria.  Musculoskeletal: Negative for arthralgias.  Neurological: Negative for syncope, light-headedness and headaches.  Psychiatric/Behavioral: Negative for dysphoric mood.       Objective:   Physical Exam  Constitutional: Vital signs are  normal. He appears well-developed and well-nourished.  obese  HENT:  Head: Normocephalic.  Right Ear: Hearing normal.  Left Ear: Hearing normal.  Nose: Nose normal.  Mouth/Throat: Oropharynx is clear and moist and mucous membranes are normal.  Neck: Trachea normal. Carotid bruit is not present. No thyroid mass and no thyromegaly present.  Cardiovascular: Normal rate, regular rhythm and normal pulses. Exam reveals no gallop, no distant heart sounds and no friction rub.  No murmur heard. No peripheral edema  Pulmonary/Chest: Effort normal and breath sounds normal. No respiratory distress.  Musculoskeletal:  bilateral leg edema.. Right > left.. 2 plus on left 1 plus on right.  Unable to straighten right lefg chronically  very ttp with calf swqueeze HOman's, also ttp in anterior shin over bone  Skin: Skin is warm, dry and intact. No rash noted.  Venous insufficiency changes   4 cm round but irregular erythematous pathc on right lower leg and erythematous papule on right posterior leg  No heat  no blister, no pustule, dry flaky skin at rash.  Psychiatric: He has a normal mood and affect. His speech is normal and behavior is normal. Thought content normal.          Assessment & Plan:

## 2018-03-20 NOTE — Assessment & Plan Note (Signed)
Treat with topical steroid as most consistent with eczema.

## 2018-03-20 NOTE — Assessment & Plan Note (Signed)
Difficult to tell if due to worsening chronic pain from past surgery complicaitons.  Recent X-ray nml, pt has folow up with ORTHO.   Will eval today for DVT given recent inactivity and increased pain and new swelling.

## 2018-03-21 ENCOUNTER — Ambulatory Visit
Admission: RE | Admit: 2018-03-21 | Discharge: 2018-03-21 | Disposition: A | Discharge status: Home / Self Care | Payer: PPO | Source: Ambulatory Visit | Attending: Orthopedic Surgery | Admitting: Orthopedic Surgery

## 2018-03-21 DIAGNOSIS — M19019 Primary osteoarthritis, unspecified shoulder: Secondary | ICD-10-CM

## 2018-03-21 DIAGNOSIS — M19011 Primary osteoarthritis, right shoulder: Secondary | ICD-10-CM | POA: Diagnosis not present

## 2018-03-25 SURGERY — Surgical Case
Anesthesia: *Unknown

## 2018-03-28 NOTE — Telephone Encounter (Signed)
Patient seen by Dr. Diona Browner same day.

## 2018-04-02 ENCOUNTER — Ambulatory Visit (INDEPENDENT_AMBULATORY_CARE_PROVIDER_SITE_OTHER): Payer: PPO | Admitting: Orthopedic Surgery

## 2018-04-02 ENCOUNTER — Encounter (INDEPENDENT_AMBULATORY_CARE_PROVIDER_SITE_OTHER): Payer: Self-pay | Admitting: Orthopedic Surgery

## 2018-04-02 DIAGNOSIS — M19019 Primary osteoarthritis, unspecified shoulder: Secondary | ICD-10-CM | POA: Diagnosis not present

## 2018-04-02 NOTE — Progress Notes (Signed)
Office Visit Note   Patient: Nicholas [name]           Date of Birth: 1951/01/27           MRN: [id number] Visit Date: 04/02/2018 Requested by: [name], MD [place], [place] [address] PCP: [name], MD  Subjective: Chief Complaint  Patient presents with  . Right Shoulder - Follow-up    HPI: Nicholas [name] is a patient with right shoulder arthritis.  Since I have seen him he has had a CT scan.  He is doing well with his augmented baseplate reverse shoulder replacement on the left-hand side.  CT scan on the right shoulder does show significant glenoid wear as well as diminished vault space on the glenoid side.  Posterior wear also present.              ROS: All systems reviewed are negative as they relate to the chief complaint within the history of present illness.  Patient denies  fevers or chills.   Assessment & Plan: Visit Diagnoses:  1. Shoulder arthritis     Plan: Impression is right shoulder pain from severe glenohumeral arthritis.  Patient is done well with his left reverse shoulder replacement.  Plan at this time is right reverse shoulder replacement.  Plan to obtain patient specific instrumentation from prior CT scanning.  We will need to use augmented baseplate due to diminished glenoid vault volume and type B2 glenoid.  Risk and benefits are discussed with the patient including but not limited to infection nerve vessel damage incomplete pain relief dislocation.  All questions answered  Follow-Up Instructions: No follow-ups on file.   Orders:  No orders of the defined types were placed in this encounter.  No orders of the defined types were placed in this encounter.     Procedures: No procedures performed   Clinical Data: No additional findings.  Objective: Vital Signs: There were no vitals taken for this visit.  Physical Exam:   Constitutional: Patient appears well-developed HEENT:  Head: Normocephalic Eyes:EOM are  normal Neck: Normal range of motion Cardiovascular: Normal rate Pulmonary/chest: Effort normal Neurologic: Patient is alert Skin: Skin is warm Psychiatric: Patient has normal mood and affect  Orthopedic exam demonstrates forward flexion and abduction on the left above 90.  On the right-hand side  Ortho Exam: He has external rotation to neutral only.  Deltoid fires.  Forward flexion abduction both about 60 degrees.  Specialty Comments:  No specialty comments available.  Imaging: No results found.   PMFS History: Patient Active Problem List   Diagnosis Date Noted  . Pain and swelling of lower leg, right 03/20/2018  . Epigastric pain 02/26/2018  . Acute on chronic congestive heart failure ([place]) 12/27/2017  . OSA (obstructive sleep apnea) 12/27/2017  . Health maintenance examination 11/25/2017  . Renal insufficiency 10/01/2017  . Status post reverse total replacement of left shoulder 10/01/2017  . Fatty liver 10/01/2017  . Bilateral shoulder pain 05/24/2017  . History of CVA in adulthood 03/08/2017  . S/P total knee arthroplasty, left 01/30/2017  . Arthritis of knee 01/15/2017  . Skin rash 12/10/2016  . Medicare annual wellness visit, initial 10/05/2016  . Advanced care planning/counseling discussion 10/05/2016  . Severe obesity (BMI 35.0-39.9) with comorbidity ([place]) 07/19/2016  . Hypothyroidism   . IDA (iron deficiency anemia) 04/23/2016  . Chronic pain syndrome 04/23/2016  . Inflammatory arthritis 04/23/2016  . Chronic leg pain 04/23/2016  . Lesion of palate 04/23/2016  . Grieving  04/23/2016  . GERD (gastroesophageal reflux disease)   . Duodenitis determined by biopsy 02/13/2016  . Diverticulosis of colon without hemorrhage 01/31/2016  . Chest pain 01/11/2012  . Migraine 01/11/2012  . History of methicillin resistant staphylococcus aureus (MRSA) 07/04/2010  . DEGENERATIVE DISC DISEASE 07/04/2010  . History of total right knee replacement 07/04/2010  . Osteoarthritis  06/09/2007  . Personal history of colonic adenoma 06/01/2003   Past Medical History:  Diagnosis Date  . Acute kidney failure 08/2008   "cleared up"  no problems since  . Allergy   . Anxiety   . Arthritis   . Asthma    ?of this no inhaler  . Blood transfusion   . CHF (congestive heart failure) ([place])    ?of this, pt denies  . CLOSTRIDIUM DIFFICILE COLITIS 07/04/2010   Annotation: 12/09, 2/10 Qualifier: Diagnosis of  By: [name], [name]    . Depression   . Depression with anxiety   . Duodenitis determined by biopsy 02/2016   peptic likely due to aleve (erosive gastropathy with duodenal erosions)  . ECZEMA 07/04/2010   Qualifier: Diagnosis of  By: [name], [name]    . Elevated liver enzymes   . GERD (gastroesophageal reflux disease)   . Heart attack ([place])    08/2008 (likely demand ischemia in the setting of MSSA sepsis/R TKA  infection)10-2008  . History of hiatal hernia   . History of stomach ulcers   . Hypothyroidism   . Lower GI bleeding   . MALAR AND MAXILLARY BONES CLOSED FRACTURE 07/04/2010   Annotation: ORIF Qualifier: Diagnosis of  By: [name], [name]    . Migraine    "definitely"  . MRSA (methicillin resistant Staphylococcus aureus)    in leg, had to place steel rod in leg  . Personal history of colonic adenoma 06/01/2003  . PFO (patent foramen ovale)    small PFO by 08/2008 TEE  . Pneumonia 08/2008   "while in ICU"  . Seizures ([place]) 2009   "long time ago"    . Stroke [facility]) 2010   unable to complete sentences at times    Family History  Problem Relation Age of Onset  . Coronary artery disease Father 55  . Stroke Father   . CAD Mother 67       stents  . Breast cancer Sister   . Diabetes Brother   . Diabetes Sister   . Colon cancer Neg Hx   . Stomach cancer Neg Hx     Past Surgical History:  Procedure Laterality Date  . anterior  nerve transposition  07/2009   left ulnar nerve  . antibiotic spacer exchange  11/2008; 08/2006   right knee  . ARTHROTOMY   08/2008   right knee w/I&D  . CARDIOVASCULAR STRESS TEST  2013   stress EKG - negative for ischemia, 4 min 7.3 METs, normal blood pressure response  . CATARACT EXTRACTION    . COLONOSCOPY  11/2012   diverticulosis, rpt 10 yrs [name])  . ESOPHAGOGASTRODUODENOSCOPY  02/2016   erosive gastropathy with duodenal erosions [name])  . HARDWARE REMOVAL  04/2006   right knee w/antibiotic spacers placed  . INGUINAL HERNIA REPAIR  early 1990's   bilateral  . JOINT REPLACEMENT Right    rt knee  . KNEE ARTHROSCOPY  10/2001   right  . KNEE FUSION  03/2009   right knee removal; antibiotic spacers;   . LEFT HEART CATH AND CORONARY ANGIOGRAPHY N/A 02/04/2018    WNL (Patwardhan, Manish  J, MD)  . REPLACEMENT TOTAL KNEE  08/2008; 09/2001   right  . REVERSE SHOULDER ARTHROPLASTY Left 09/03/2017   Procedure: REVERSE SHOULDER ARTHROPLASTY;  Surgeon: Meredith Pel, MD;  Location: [facility];  Service: Orthopedics;  Laterality: Left;  . rod placement Right   . SYNOVECTOMY  06/2005   debridement, liner exchange right knee  . TOE AMPUTATION Left 08/2008   great toe - osteomyelitis (staph infection)  . TOE AMPUTATION Left 04/2016   2nd toe [name]  . TONSILLECTOMY    . TOTAL KNEE ARTHROPLASTY Left 01/15/2017  . TOTAL KNEE ARTHROPLASTY Left 01/15/2017   Procedure: TOTAL KNEE ARTHROPLASTY;  Surgeon: Meredith Pel, MD;  Location: [facility];  Service: Orthopedics;  Laterality: Left;   Social History   Occupational History  . Occupation: retired  Tobacco Use  . Smoking status: Never Smoker  . Smokeless tobacco: Never Used  Substance and Sexual Activity  . Alcohol use: No    Alcohol/week: 0.0 oz    Comment: "I abused alcohol; last drink  ~ 2005"  . Drug use: No  . Sexual activity: Never

## 2018-04-11 ENCOUNTER — Other Ambulatory Visit (INDEPENDENT_AMBULATORY_CARE_PROVIDER_SITE_OTHER): Payer: Self-pay | Admitting: Orthopedic Surgery

## 2018-04-11 DIAGNOSIS — M19011 Primary osteoarthritis, right shoulder: Secondary | ICD-10-CM

## 2018-04-18 ENCOUNTER — Inpatient Hospital Stay (INDEPENDENT_AMBULATORY_CARE_PROVIDER_SITE_OTHER): Payer: PPO | Admitting: Orthopedic Surgery

## 2018-04-20 ENCOUNTER — Other Ambulatory Visit: Payer: Self-pay | Admitting: Family Medicine

## 2018-04-21 NOTE — Telephone Encounter (Signed)
Ok to refill? Electronically refill request   Last prescribed on 12/27/2017  Last office visit on with Dr Danise Mina on 02/25/2018

## 2018-04-22 ENCOUNTER — Other Ambulatory Visit: Payer: Self-pay | Admitting: Family Medicine

## 2018-04-23 ENCOUNTER — Telehealth (INDEPENDENT_AMBULATORY_CARE_PROVIDER_SITE_OTHER): Payer: Self-pay | Admitting: Orthopedic Surgery

## 2018-04-23 MED ORDER — SUMATRIPTAN SUCCINATE 100 MG PO TABS: 100.0000 mg | ORAL_TABLET | Freq: Every day | ORAL | 1 refills | Status: DC | PRN

## 2018-04-23 NOTE — Telephone Encounter (Signed)
Patient called to let Dr [name] Sa know that he is having pain in his upper back on the right side. Patient said he also have pain going down his right arm. Patient asked if Dr [name] Sa want him to come in sooner. Patient said he is having trouble sleeping a night. The number to contact patient is [phone]

## 2018-04-23 NOTE — Telephone Encounter (Signed)
pls advise

## 2018-04-23 NOTE — Telephone Encounter (Signed)
Imitrex Last filled:  03/20/18, #12 Last OV:  03/20/18 Next OV:  05/26/18

## 2018-04-24 NOTE — Telephone Encounter (Signed)
Ok for tramadol 1 po q 8 # 30 pls clal thx

## 2018-04-24 NOTE — Telephone Encounter (Signed)
Consistent with his shoulder oa - does he want something else?

## 2018-04-24 NOTE — Telephone Encounter (Signed)
IC advised. He said ultram does not work for him so he did not want rx.

## 2018-04-24 NOTE — Telephone Encounter (Signed)
IC s/w patient and advised. He wants to know if there is anything that he can take to help with pain? Please advise. Thanks.

## 2018-05-02 NOTE — Pre-Procedure Instructions (Signed)
[place] [facility]  05/02/2018      [id number] - [place], [address] [place] [address] Phone: [phone] Fax: [phone]    Your procedure is scheduled on Tuesday July 30.  Report to [facility] Admitting at 9:00 A.M.  Call this number if you have problems the morning of surgery:  [phone]   Remember:  Do not eat or drink after midnight.    Take these medicines the morning of surgery with A SIP OF WATER:   Metoprolol (Toprol-XL) Gabapentin (neurontin) Levothyroxine (synthroid) Pantoprazole (protonix)  7 days prior to surgery STOP taking any Aspirin(unless otherwise instructed by your surgeon), Aleve, Naproxen, Ibuprofen, Motrin, Advil, Goody's, BC's, all herbal medications, fish oil, and all vitamins    Do not wear jewelry, make-up or nail polish.  Do not wear lotions, powders, or perfumes, or deodorant.  Do not shave 48 hours prior to surgery.  Men may shave face and neck.  Do not bring valuables to the hospital.  [facility] is not responsible for any belongings or valuables.  Contacts, dentures or bridgework may not be worn into surgery.  Leave your suitcase in the car.  After surgery it may be brought to your room.  For patients admitted to the hospital, discharge time will be determined by your treatment team.  Patients discharged the day of surgery will not be allowed to drive home.   Special instructions:    [place]- Preparing For Surgery  Before surgery, you can play an important role. Because skin is not sterile, your skin needs to be as free of germs as possible. You can reduce the number of germs on your skin by washing with CHG (chlorahexidine gluconate) Soap before surgery.  CHG is an antiseptic cleaner which kills germs and bonds with the skin to continue killing germs even after washing.    Oral Hygiene is also important to reduce your risk of infection.  Remember - BRUSH YOUR TEETH THE  MORNING OF SURGERY WITH YOUR REGULAR TOOTHPASTE  Please do not use if you have an allergy to CHG or antibacterial soaps. If your skin becomes reddened/irritated stop using the CHG.  Do not shave (including legs and underarms) for at least 48 hours prior to first CHG shower. It is OK to shave your face.  Please follow these instructions carefully.   1. Shower the NIGHT BEFORE SURGERY and the MORNING OF SURGERY with CHG.   2. If you chose to wash your hair, wash your hair first as usual with your normal shampoo.  3. After you shampoo, rinse your hair and body thoroughly to remove the shampoo.  4. Use CHG as you would any other liquid soap. You can apply CHG directly to the skin and wash gently with a scrungie or a clean washcloth.   5. Apply the CHG Soap to your body ONLY FROM THE NECK DOWN.  Do not use on open wounds or open sores. Avoid contact with your eyes, ears, mouth and genitals (private parts). Wash Face and genitals (private parts)  with your normal soap.  6. Wash thoroughly, paying special attention to the area where your surgery will be performed.  7. Thoroughly rinse your body with warm water from the neck down.  8. DO NOT shower/wash with your normal soap after using and rinsing off the CHG Soap.  9. Pat yourself dry with a CLEAN TOWEL.  10. Wear CLEAN PAJAMAS to bed the night before surgery,  wear comfortable clothes the morning of surgery  11. Place CLEAN SHEETS on your bed the night of your first shower and DO NOT SLEEP WITH PETS.    Day of Surgery:  Do not apply any deodorants/lotions.  Please wear clean clothes to the hospital/surgery center.   Remember to brush your teeth WITH YOUR REGULAR TOOTHPASTE.    Please read over the following fact sheets that you were given. Coughing and Deep Breathing, MRSA Information and Surgical Site Infection Prevention

## 2018-05-05 ENCOUNTER — Encounter (HOSPITAL_COMMUNITY)
Admission: RE | Admit: 2018-05-05 | Discharge: 2018-05-05 | Disposition: A | Discharge status: Home / Self Care | Payer: PPO | Source: Ambulatory Visit | Attending: Orthopedic Surgery | Admitting: Orthopedic Surgery

## 2018-05-05 ENCOUNTER — Encounter (HOSPITAL_COMMUNITY): Payer: Self-pay

## 2018-05-05 ENCOUNTER — Other Ambulatory Visit: Payer: Self-pay

## 2018-05-05 DIAGNOSIS — M19011 Primary osteoarthritis, right shoulder: Secondary | ICD-10-CM | POA: Insufficient documentation

## 2018-05-05 DIAGNOSIS — K219 Gastro-esophageal reflux disease without esophagitis: Secondary | ICD-10-CM | POA: Insufficient documentation

## 2018-05-05 DIAGNOSIS — Z01812 Encounter for preprocedural laboratory examination: Secondary | ICD-10-CM | POA: Insufficient documentation

## 2018-05-05 DIAGNOSIS — I252 Old myocardial infarction: Secondary | ICD-10-CM | POA: Insufficient documentation

## 2018-05-05 DIAGNOSIS — Z8673 Personal history of transient ischemic attack (TIA), and cerebral infarction without residual deficits: Secondary | ICD-10-CM | POA: Insufficient documentation

## 2018-05-05 LAB — BASIC METABOLIC PANEL
Anion gap: 12 (ref 5–15)
BUN: 17 mg/dL (ref 8–23)
CO2: 21 mmol/L — ABNORMAL LOW (ref 22–32)
Calcium: 8.9 mg/dL (ref 8.9–10.3)
Chloride: 105 mmol/L (ref 98–111)
Creatinine, Ser: 1.2 mg/dL (ref 0.61–1.24)
GFR calc Af Amer: >60 mL/min (ref >60)
GLUCOSE: 147 mg/dL — AB (ref 70–99)
POTASSIUM: 4 mmol/L (ref 3.5–5.1)
Sodium: 138 mmol/L (ref 135–145)

## 2018-05-05 LAB — CBC
HEMATOCRIT: 44.3 % (ref 39.0–52.0)
HEMOGLOBIN: 14.4 g/dL (ref 13.0–17.0)
MCH: 30 pg (ref 26.0–34.0)
MCHC: 32.5 g/dL (ref 30.0–36.0)
MCV: 92.3 fL (ref 78.0–100.0)
Platelets: 215 10*3/uL (ref 150–400)
RBC: 4.8 MIL/uL (ref 4.22–5.81)
RDW: 14.7 % (ref 11.5–15.5)
WBC: 8.5 10*3/uL (ref 4.0–10.5)

## 2018-05-05 LAB — URINALYSIS, ROUTINE W REFLEX MICROSCOPIC
BILIRUBIN URINE: NEGATIVE
GLUCOSE, UA: NEGATIVE mg/dL
HGB URINE DIPSTICK: NEGATIVE
Ketones, ur: NEGATIVE mg/dL
Leukocytes, UA: NEGATIVE
NITRITE: NEGATIVE
PH: 5 (ref 5.0–8.0)
Protein, ur: NEGATIVE mg/dL
SPECIFIC GRAVITY, URINE: 1.014 (ref 1.005–1.030)

## 2018-05-05 LAB — SURGICAL PCR SCREEN
MRSA, PCR: NEGATIVE
STAPHYLOCOCCUS AUREUS: POSITIVE — AB

## 2018-05-05 NOTE — Progress Notes (Signed)
   05/05/18 1007  OBSTRUCTIVE SLEEP APNEA  Have you ever been diagnosed with sleep apnea through a sleep study? No  Do you snore loudly (loud enough to be heard through closed doors)?  0  Do you often feel tired, fatigued, or sleepy during the daytime (such as falling asleep during driving or talking to someone)? 0  Has anyone observed you stop breathing during your sleep? 0  Do you have, or are you being treated for high blood pressure? 1  BMI more than 35 kg/m2? 1  Age > 50 (1-yes) 1  Neck circumference greater than:Male 16 inches or larger, Male 17inches or larger? 1  Male Gender (Yes=1) 1  Obstructive Sleep Apnea Score 5  Score 5 or greater  Results sent to PCP

## 2018-05-05 NOTE — Progress Notes (Signed)
PCP - Dr. [name]  Cardiologist - Dr. Adrian Prows  Chest x-ray - N/A EKG - 02/04/18 Stress Test - 2019; at Dr. Irven Shelling office-results requested. ECHO - 01/09/18 Cardiac Cath - 02/04/18  Sleep Study - denies; stop bang positive. Results sent to PCP  Aspirin Instructions: Pt stated he has stopped taking ASA for about a month now. Not prescribed by MD but by personal discretion.   Anesthesia review: Yes, heart history and stress test results requested by Dr. Irven Shelling office.   Patient denies shortness of breath, fever, cough and chest pain at PAT appointment   Patient verbalized understanding of instructions that were given to them at the PAT appointment. Patient was also instructed that they will need to review over the PAT instructions again at home before surgery.

## 2018-05-06 NOTE — Progress Notes (Signed)
Anesthesia Chart Review:  Case:  [id number] Date/Time:  05/13/18 1050   Procedure:  RIGHT REVERSE SHOULDER ARTHROPLASTY (Right )   Anesthesia type:  General   Pre-op diagnosis:  RIGHT SHOULDER OA   Location:  [facility] OR ROOM 06 / [place] [facility]   Surgeon:  Meredith Pel, MD      DISCUSSION: 67 yo male never smoker for above procedure. Pertinent hx includes PUD, Migraine HA, small PFO (by 08/2008 TEE), Asthma, GERD, Hiatal Hernia, Arthritis, Depression, Anxiety, Anxiety, Seizures, Hypothyroid, MI 08/2008 (possible demand ischemia in the setting of MSSA sepsis), CVA (anterior right periopercular region) 08/2008, seizure (remote), right TKA 09/18/01 s/p removal 04/11/06 (MSSA infection) with revision 06/25/06 s/p removal and left great toe amputation 09/07/08 (in the setting of MSSA sepsis with right knee septic arthritis and acute pyelonephritis with septic emboli), s/p right knee removal and knee fusion 03/29/09, left TKA 01/15/17, left total shoulder 08/27/2017. OSA screening score of 5.   Pt was cleared by cardiologist Dr. Einar Gip 02/13/2018. Per his note "due to recurrent chest tightness, mildly abnormal nuclear stress test, underwent coronary angiography with essentially normal coronary arteries.  Suspect his chest pain is related to noncardiac etiology probably GERD.  Dyspnea is related to obesity and deconditioning.  He does have right knee arthrodesis from recurrent right knee infection and he now has a rod unable to bend his right knee.  He has chronic right leg edema.  He has responded to Lasix and suspect it is due to arthritides.  If his leg edema does not improve ot continue to persist, consider lower extremity venous insufficiency study.  He is scheduled for right shoulder repair, he can be taken up for the surgery with acceptable cardiovascular risk.  From cardiac standpoint remains stable, I will see him back on a prn basis."  Anticipate he can proceed with surgery as planned barring acute status  change.  VS: BP 134/71   Pulse 66   Temp 36.5 C   Resp 20   Ht 5\' 9"  (1.753 m)   Wt 272 lb 4.8 oz (123.5 kg)   SpO2 98%   BMI 40.21 kg/m   PROVIDERS: [name], MD is PCP last seen 02/25/2018  Kela Millin, MD is Cardiologist last sen 02/13/2018   LABS: Labs reviewed: Acceptable for surgery. (all labs ordered are listed, but only abnormal results are displayed)  Labs Reviewed  URINE CULTURE - Abnormal; Notable for the following components:      Result Value   Culture   (*)    Value: <10,000 COLONIES/mL INSIGNIFICANT GROWTH Performed at [facility], [address], [place], [place] [address]    All other components within normal limits  SURGICAL PCR SCREEN - Abnormal; Notable for the following components:   Staphylococcus aureus POSITIVE (*)    All other components within normal limits  BASIC METABOLIC PANEL - Abnormal; Notable for the following components:   CO2 21 (*)    Glucose, Bld 147 (*)    All other components within normal limits  CBC  URINALYSIS, ROUTINE W REFLEX MICROSCOPIC     IMAGES: CHEST  2 VIEW 01/04/2017  COMPARISON:  11/01/16  FINDINGS: Cardiomediastinal silhouette is stable. No infiltrate or pleural effusion. Stable mild streaky atelectasis or scarring lung bases. Mild degenerative changes mid thoracic spine.  IMPRESSION: No active cardiopulmonary disease. Mild degenerative changes mid thoracic spine.  EKG: 02/04/2018: Normal sinus rhythm. Left axis deviation. Pulmonary disease pattern. Abnormal ECG No significant change since last tracing  CV: Cath 02/04/2018: Normal coronary arteries without any significant coronary artery disease Normal LVEDP Normal LVEF  Chest pain likely noncardiac in origin. Proceed with shoulder surgery with low cardiac risk.  Nuclear Stress 01/03/2018 (outside record, cop in pt chart): The overall quality of the study is fair.  Left ventricular cavity is noted to be normal on the rest and  stress studies.  Gated SPECT images reveal normal myocardial thickening and wall motion.  LVEF 48%.  SPECT rest images show a large area of basal to apical inferior, inferior apical, basal inferoseptal and inferolateral perfusion defect of mild to moderate intensity.  These defects may be related to soft tissue attenuation of rest.  However, SPECT stress images only demonstrate a small area of mild intensity perfusion defect in mid to basal inferior myocardium.  This may suggest small area of mild ischemia in the mid to basal inferior myocardium.  Low to intermediate risk study due to reduced LVEF.  Findings may represent nonischemic cardiomyopathy.  Clinical correlation recommended.  Past Medical History:  Diagnosis Date  . Acute kidney failure 08/2008   "cleared up"  no problems since  . Allergy   . Anxiety   . Arthritis   . Asthma    ?of this no inhaler  . Blood transfusion   . CHF (congestive heart failure) ([place])    ?of this, pt denies  . CLOSTRIDIUM DIFFICILE COLITIS 07/04/2010   Annotation: 12/09, 2/10 Qualifier: Diagnosis of  By: [name], [name]    . Depression   . Depression with anxiety   . Duodenitis determined by biopsy 02/2016   peptic likely due to aleve (erosive gastropathy with duodenal erosions)  . ECZEMA 07/04/2010   Qualifier: Diagnosis of  By: [name], [name]    . Elevated liver enzymes   . GERD (gastroesophageal reflux disease)   . Heart attack ([place])    08/2008 (likely demand ischemia in the setting of MSSA sepsis/R TKA  infection)10-2008  . History of hiatal hernia   . History of stomach ulcers   . Hypothyroidism   . Lower GI bleeding   . MALAR AND MAXILLARY BONES CLOSED FRACTURE 07/04/2010   Annotation: ORIF Qualifier: Diagnosis of  By: [name], [name]    . Migraine    "definitely"  . MRSA (methicillin resistant Staphylococcus aureus)    in leg, had to place steel rod in leg  . Personal history of colonic adenoma 06/01/2003  . PFO (patent foramen ovale)     small PFO by 08/2008 TEE  . Pneumonia 08/2008   "while in ICU"  . Seizures ([place]) 2009   "long time ago"    . Stroke [facility]) 2010   unable to complete sentences at times    Past Surgical History:  Procedure Laterality Date  . anterior  nerve transposition  07/2009   left ulnar nerve  . antibiotic spacer exchange  11/2008; 08/2006   right knee  . ARTHROTOMY  08/2008   right knee w/I&D  . CARDIAC CATHETERIZATION  02/04/2018  . CARDIOVASCULAR STRESS TEST  2013   stress EKG - negative for ischemia, 4 min 7.3 METs, normal blood pressure response  . CATARACT EXTRACTION    . COLONOSCOPY  11/2012   diverticulosis, rpt 10 yrs [name])  . ESOPHAGOGASTRODUODENOSCOPY  02/2016   erosive gastropathy with duodenal erosions [name])  . HARDWARE REMOVAL  04/2006   right knee w/antibiotic spacers placed  . INGUINAL HERNIA REPAIR  early 1990's   bilateral  . JOINT REPLACEMENT Right  rt knee  . KNEE ARTHROSCOPY  10/2001   right  . KNEE FUSION  03/2009   right knee removal; antibiotic spacers;   . LEFT HEART CATH AND CORONARY ANGIOGRAPHY N/A 02/04/2018    WNL (Patwardhan, Reynold Bowen, MD)  . REPLACEMENT TOTAL KNEE  08/2008; 09/2001   right  . REVERSE SHOULDER ARTHROPLASTY Left 09/03/2017   Procedure: REVERSE SHOULDER ARTHROPLASTY;  Surgeon: Meredith Pel, MD;  Location: [place];  Service: Orthopedics;  Laterality: Left;  . rod placement Right   . SYNOVECTOMY  06/2005   debridement, liner exchange right knee  . TOE AMPUTATION Left 08/2008   great toe - osteomyelitis (staph infection)  . TOE AMPUTATION Left 04/2016   2nd toe [name]  . TONSILLECTOMY    . TOTAL KNEE ARTHROPLASTY Left 01/15/2017  . TOTAL KNEE ARTHROPLASTY Left 01/15/2017   Procedure: TOTAL KNEE ARTHROPLASTY;  Surgeon: Meredith Pel, MD;  Location: [place];  Service: Orthopedics;  Laterality: Left;    MEDICATIONS: . acetaminophen (TYLENOL) 500 MG tablet  . amitriptyline (ELAVIL) 25 MG tablet  . amitriptyline (ELAVIL) 50  MG tablet  . aspirin 81 MG tablet  . ferrous sulfate 325 (65 FE) MG EC tablet  . furosemide (LASIX) 20 MG tablet  . gabapentin (NEURONTIN) 300 MG capsule  . HYDROcodone-acetaminophen (NORCO) 10-325 MG tablet  . hydrocortisone 2.5 % cream  . levothyroxine (SYNTHROID, LEVOTHROID) 88 MCG tablet  . metoprolol succinate (TOPROL-XL) 100 MG 24 hr tablet  . Multiple Vitamin (MULTIVITAMIN WITH MINERALS) TABS tablet  . nitroGLYCERIN (NITROSTAT) 0.4 MG SL tablet  . pantoprazole (PROTONIX) 40 MG tablet  . Probiotic Product (PROBIOTIC PO)  . ranitidine (ZANTAC) 150 MG tablet  . SUMAtriptan (IMITREX) 100 MG tablet  . triamcinolone cream (KENALOG) 0.5 %   No current facility-administered medications for this encounter.      Wynonia Musty [facility] Short Stay Center/Anesthesiology Phone [phone] 05/06/2018 1:13 PM

## 2018-05-07 LAB — URINE CULTURE: Culture: <10000 — AB

## 2018-05-08 ENCOUNTER — Encounter: Payer: Self-pay | Admitting: Family Medicine

## 2018-05-12 MED ORDER — DEXTROSE 5 % IV SOLN
3.0000 g | INTRAVENOUS | Status: AC
  Administered 2018-05-13 (×2): 3 g via INTRAVENOUS
  Filled 2018-05-12: qty 3

## 2018-05-12 NOTE — H&P (Signed)
Nicholas [name] is an 67 y.o. male.   Chief Complaint: Right shoulder pain HPI: Nicholas [name] is a 67 year old patient with right shoulder pain.  Has known end-stage arthritis in the shoulder.  He has reasonably good rotator cuff function but severely restricted range of motion and significant pain.  He also has excessive wear on the glenoid.  Patient presents now for operative management after having a good result with his left reverse shoulder replacement.  He has failed all conservative measures including multiple injections and therapeutic exercise.  Plan is for patient specific instrumentation for this prosthesis.  Past Medical History:  Diagnosis Date  . Acute kidney failure 08/2008   "cleared up"  no problems since  . Allergy   . Anxiety   . Arthritis   . Asthma    ?of this no inhaler  . Blood transfusion   . CHF (congestive heart failure) ([place])    ?of this, pt denies  . CLOSTRIDIUM DIFFICILE COLITIS 07/04/2010   Annotation: 12/09, 2/10 Qualifier: Diagnosis of  By: [name], [name]    . Depression   . Depression with anxiety   . Duodenitis determined by biopsy 02/2016   peptic likely due to aleve (erosive gastropathy with duodenal erosions)  . ECZEMA 07/04/2010   Qualifier: Diagnosis of  By: [name], [name]    . Elevated liver enzymes   . GERD (gastroesophageal reflux disease)   . Heart attack ([place])    08/2008 (likely demand ischemia in the setting of MSSA sepsis/R TKA  infection)10-2008  . History of hiatal hernia   . History of stomach ulcers   . Hypothyroidism   . Lower GI bleeding   . MALAR AND MAXILLARY BONES CLOSED FRACTURE 07/04/2010   Annotation: ORIF Qualifier: Diagnosis of  By: [name], [name]    . Migraine    "definitely"  . MRSA (methicillin resistant Staphylococcus aureus)    in leg, had to place steel rod in leg  . Personal history of colonic adenoma 06/01/2003  . PFO (patent foramen ovale)    small PFO by 08/2008 TEE  . Pneumonia 08/2008   "while in ICU"  .  Seizures ([place]) 2009   "long time ago"    . Stroke [facility]) 2010   unable to complete sentences at times    Past Surgical History:  Procedure Laterality Date  . anterior  nerve transposition  07/2009   left ulnar nerve  . antibiotic spacer exchange  11/2008; 08/2006   right knee  . ARTHROTOMY  08/2008   right knee w/I&D  . CARDIAC CATHETERIZATION  02/04/2018  . CARDIOVASCULAR STRESS TEST  2013   stress EKG - negative for ischemia, 4 min 7.3 METs, normal blood pressure response  . CATARACT EXTRACTION    . COLONOSCOPY  11/2012   diverticulosis, rpt 10 yrs [name])  . ESOPHAGOGASTRODUODENOSCOPY  02/2016   erosive gastropathy with duodenal erosions [name])  . HARDWARE REMOVAL  04/2006   right knee w/antibiotic spacers placed  . INGUINAL HERNIA REPAIR  early 1990's   bilateral  . JOINT REPLACEMENT Right    rt knee  . KNEE ARTHROSCOPY  10/2001   right  . KNEE FUSION  03/2009   right knee removal; antibiotic spacers;   . LEFT HEART CATH AND CORONARY ANGIOGRAPHY N/A 02/04/2018    WNL (Patwardhan, Reynold Bowen, MD)  . REPLACEMENT TOTAL KNEE  08/2008; 09/2001   right  . REVERSE SHOULDER ARTHROPLASTY Left 09/03/2017   Procedure: REVERSE SHOULDER ARTHROPLASTY;  Surgeon: Marcene Duos  Nicki Reaper, MD;  Location: [place];  Service: Orthopedics;  Laterality: Left;  . rod placement Right   . SYNOVECTOMY  06/2005   debridement, liner exchange right knee  . TOE AMPUTATION Left 08/2008   great toe - osteomyelitis (staph infection)  . TOE AMPUTATION Left 04/2016   2nd toe [name]  . TONSILLECTOMY    . TOTAL KNEE ARTHROPLASTY Left 01/15/2017  . TOTAL KNEE ARTHROPLASTY Left 01/15/2017   Procedure: TOTAL KNEE ARTHROPLASTY;  Surgeon: Meredith Pel, MD;  Location: [place];  Service: Orthopedics;  Laterality: Left;    Family History  Problem Relation Age of Onset  . Coronary artery disease Father 99  . Stroke Father   . CAD Mother 14       stents  . Breast cancer Sister   . Diabetes Brother   . Diabetes  Sister   . Colon cancer Neg Hx   . Stomach cancer Neg Hx    Social History:  reports that he has never smoked. He has never used smokeless tobacco. He reports that he does not drink alcohol or use drugs.  Allergies:  Allergies  Allergen Reactions  . Bee Venom Anaphylaxis and Other (See Comments)    Respiratory Distress  . Peanut-Containing Drug Products Anaphylaxis and Dermatitis  . Celebrex [Celecoxib] Other (See Comments)    BLACK STOOL?MELENA?  [name] Percocet [Oxycodone-Acetaminophen] Hives, Swelling and Other (See Comments)    SWELLING REACTION UNSPECIFIED   . Latex Rash  . Nsaids Other (See Comments)    Disagrees with system    No medications prior to admission.    No results found for this or any previous visit (from the past 48 hour(s)). No results found.  Review of Systems  Musculoskeletal: Positive for joint pain.  All other systems reviewed and are negative.   There were no vitals taken for this visit. Physical Exam  Constitutional: He appears well-developed.  Eyes: Pupils are equal, round, and reactive to light.  Neck: Normal range of motion.  Cardiovascular: Normal rate.  Respiratory: Effort normal.  Neurological: He is alert.  Skin: Skin is warm.  Psychiatric: He has a normal mood and affect.  Examination of the right shoulder demonstrates forward flexion and abduction both below 90 degrees.  Deltoid is functional.  External rotation of 15 degrees of abduction is about 10 degrees on the right or less.  Motor or sensory function to the hand is intact.  Skin is intact in the right shoulder girdle region.  Assessment/Plan Impression is end-stage severe right shoulder arthritis with B2 glenoid.  Plan is reverse shoulder replacement with augmented baseplate.  Risk and benefits are discussed including but not limited to infection nerve vessel damage dislocation instability and potential need for more surgery.  The patient has had a good result with his left reverse  shoulder replacement.  He is a fairly low demand patient who does not do weightbearing on that right or left arm.  Patient understands the risk benefits and wishes to proceed.  All questions answered.  Anderson Malta, MD 05/12/2018, 5:26 PM

## 2018-05-13 ENCOUNTER — Other Ambulatory Visit: Payer: Self-pay

## 2018-05-13 ENCOUNTER — Inpatient Hospital Stay (HOSPITAL_COMMUNITY): Payer: PPO | Admitting: Physician Assistant

## 2018-05-13 ENCOUNTER — Inpatient Hospital Stay (HOSPITAL_COMMUNITY)
Admission: RE | Admit: 2018-05-13 | Discharge: 2018-05-16 | DRG: 483 | Disposition: A | Discharge status: Home / Self Care | Payer: PPO | Attending: Orthopedic Surgery | Admitting: Orthopedic Surgery

## 2018-05-13 ENCOUNTER — Encounter (HOSPITAL_COMMUNITY)
Admission: RE | Disposition: A | Discharge status: Home / Self Care | Payer: Self-pay | Source: Home / Self Care | Attending: Orthopedic Surgery

## 2018-05-13 ENCOUNTER — Encounter (HOSPITAL_COMMUNITY): Payer: Self-pay

## 2018-05-13 ENCOUNTER — Inpatient Hospital Stay (HOSPITAL_COMMUNITY): Payer: PPO

## 2018-05-13 ENCOUNTER — Inpatient Hospital Stay (HOSPITAL_COMMUNITY): Payer: PPO | Admitting: Certified Registered Nurse Anesthetist

## 2018-05-13 DIAGNOSIS — Z9849 Cataract extraction status, unspecified eye: Secondary | ICD-10-CM | POA: Diagnosis not present

## 2018-05-13 DIAGNOSIS — E039 Hypothyroidism, unspecified: Secondary | ICD-10-CM | POA: Diagnosis not present

## 2018-05-13 DIAGNOSIS — Z91010 Allergy to peanuts: Secondary | ICD-10-CM

## 2018-05-13 DIAGNOSIS — R0789 Other chest pain: Secondary | ICD-10-CM | POA: Diagnosis not present

## 2018-05-13 DIAGNOSIS — K219 Gastro-esophageal reflux disease without esophagitis: Secondary | ICD-10-CM | POA: Diagnosis present

## 2018-05-13 DIAGNOSIS — Q211 Atrial septal defect: Secondary | ICD-10-CM | POA: Diagnosis not present

## 2018-05-13 DIAGNOSIS — Z886 Allergy status to analgesic agent status: Secondary | ICD-10-CM | POA: Diagnosis not present

## 2018-05-13 DIAGNOSIS — M19011 Primary osteoarthritis, right shoulder: Secondary | ICD-10-CM | POA: Diagnosis present

## 2018-05-13 DIAGNOSIS — Z96612 Presence of left artificial shoulder joint: Secondary | ICD-10-CM | POA: Diagnosis not present

## 2018-05-13 DIAGNOSIS — I509 Heart failure, unspecified: Secondary | ICD-10-CM | POA: Diagnosis not present

## 2018-05-13 DIAGNOSIS — Z8711 Personal history of peptic ulcer disease: Secondary | ICD-10-CM | POA: Diagnosis not present

## 2018-05-13 DIAGNOSIS — Z8673 Personal history of transient ischemic attack (TIA), and cerebral infarction without residual deficits: Secondary | ICD-10-CM | POA: Diagnosis not present

## 2018-05-13 DIAGNOSIS — I10 Essential (primary) hypertension: Secondary | ICD-10-CM | POA: Diagnosis present

## 2018-05-13 DIAGNOSIS — I252 Old myocardial infarction: Secondary | ICD-10-CM | POA: Diagnosis not present

## 2018-05-13 DIAGNOSIS — G8918 Other acute postprocedural pain: Secondary | ICD-10-CM | POA: Diagnosis not present

## 2018-05-13 DIAGNOSIS — R072 Precordial pain: Secondary | ICD-10-CM | POA: Diagnosis not present

## 2018-05-13 DIAGNOSIS — Z96652 Presence of left artificial knee joint: Secondary | ICD-10-CM | POA: Diagnosis present

## 2018-05-13 DIAGNOSIS — I2510 Atherosclerotic heart disease of native coronary artery without angina pectoris: Secondary | ICD-10-CM | POA: Diagnosis present

## 2018-05-13 DIAGNOSIS — Z885 Allergy status to narcotic agent status: Secondary | ICD-10-CM

## 2018-05-13 DIAGNOSIS — M19019 Primary osteoarthritis, unspecified shoulder: Secondary | ICD-10-CM

## 2018-05-13 DIAGNOSIS — Z91030 Bee allergy status: Secondary | ICD-10-CM

## 2018-05-13 DIAGNOSIS — Z96611 Presence of right artificial shoulder joint: Secondary | ICD-10-CM | POA: Diagnosis not present

## 2018-05-13 DIAGNOSIS — Z471 Aftercare following joint replacement surgery: Secondary | ICD-10-CM | POA: Diagnosis not present

## 2018-05-13 DIAGNOSIS — Z91040 Latex allergy status: Secondary | ICD-10-CM

## 2018-05-13 DIAGNOSIS — G4733 Obstructive sleep apnea (adult) (pediatric): Secondary | ICD-10-CM | POA: Diagnosis not present

## 2018-05-13 HISTORY — PX: REVERSE SHOULDER ARTHROPLASTY: SHX5054

## 2018-05-13 SURGERY — ARTHROPLASTY, SHOULDER, TOTAL, REVERSE
Anesthesia: General | Site: Shoulder | Laterality: Right

## 2018-05-13 MED ORDER — PHENYLEPHRINE 40 MCG/ML (10ML) SYRINGE FOR IV PUSH (FOR BLOOD PRESSURE SUPPORT)
PREFILLED_SYRINGE | INTRAVENOUS | Status: AC
  Filled 2018-05-13: qty 20

## 2018-05-13 MED ORDER — FENTANYL CITRATE (PF) 100 MCG/2ML IJ SOLN
100.0000 ug | Freq: Once | INTRAMUSCULAR | Status: AC
  Administered 2018-05-13: 50 ug via INTRAVENOUS

## 2018-05-13 MED ORDER — METOCLOPRAMIDE HCL 5 MG/ML IJ SOLN: 5.0000 mg | Freq: Three times a day (TID) | INTRAMUSCULAR | Status: DC | PRN

## 2018-05-13 MED ORDER — ONDANSETRON HCL 4 MG/2ML IJ SOLN
INTRAMUSCULAR | Status: AC
  Filled 2018-05-13: qty 2

## 2018-05-13 MED ORDER — DOCUSATE SODIUM 100 MG PO CAPS
100.0000 mg | ORAL_CAPSULE | Freq: Two times a day (BID) | ORAL | Status: DC
  Administered 2018-05-13 – 2018-05-16 (×6): 100 mg via ORAL
  Filled 2018-05-13 (×6): qty 1

## 2018-05-13 MED ORDER — GLYCOPYRROLATE PF 0.2 MG/ML IJ SOSY
PREFILLED_SYRINGE | INTRAMUSCULAR | Status: DC | PRN
  Administered 2018-05-13: .2 mg via INTRAVENOUS

## 2018-05-13 MED ORDER — PHENYLEPHRINE HCL 10 MG/ML IJ SOLN
INTRAMUSCULAR | Status: DC | PRN
  Administered 2018-05-13: 85 ug/min via INTRAVENOUS
  Administered 2018-05-13: 25 ug/min via INTRAVENOUS

## 2018-05-13 MED ORDER — DEXAMETHASONE SODIUM PHOSPHATE 10 MG/ML IJ SOLN
INTRAMUSCULAR | Status: AC
  Filled 2018-05-13: qty 1

## 2018-05-13 MED ORDER — GLYCOPYRROLATE PF 0.2 MG/ML IJ SOSY
PREFILLED_SYRINGE | INTRAMUSCULAR | Status: AC
  Filled 2018-05-13: qty 1

## 2018-05-13 MED ORDER — CHLORHEXIDINE GLUCONATE 4 % EX LIQD: 60.0000 mL | Freq: Once | CUTANEOUS | Status: DC

## 2018-05-13 MED ORDER — ACETAMINOPHEN 325 MG PO TABS
325.0000 mg | ORAL_TABLET | Freq: Two times a day (BID) | ORAL | Status: AC
  Administered 2018-05-13 – 2018-05-15 (×4): 325 mg via ORAL
  Filled 2018-05-13 (×4): qty 1

## 2018-05-13 MED ORDER — METHOCARBAMOL 500 MG PO TABS
500.0000 mg | ORAL_TABLET | Freq: Four times a day (QID) | ORAL | Status: DC | PRN
  Administered 2018-05-15: 500 mg via ORAL
  Filled 2018-05-13: qty 1

## 2018-05-13 MED ORDER — ASPIRIN 81 MG PO CHEW
81.0000 mg | CHEWABLE_TABLET | Freq: Every day | ORAL | Status: DC
  Administered 2018-05-13 – 2018-05-16 (×4): 81 mg via ORAL
  Filled 2018-05-13 (×4): qty 1

## 2018-05-13 MED ORDER — CEFAZOLIN SODIUM-DEXTROSE 2-4 GM/100ML-% IV SOLN
2.0000 g | Freq: Four times a day (QID) | INTRAVENOUS | Status: AC
  Administered 2018-05-13 – 2018-05-14 (×2): 2 g via INTRAVENOUS
  Filled 2018-05-13 (×2): qty 100

## 2018-05-13 MED ORDER — MIDAZOLAM HCL 2 MG/2ML IJ SOLN
INTRAMUSCULAR | Status: AC
  Administered 2018-05-13: 1 mg via INTRAVENOUS
  Filled 2018-05-13: qty 2

## 2018-05-13 MED ORDER — EPHEDRINE SULFATE 50 MG/ML IJ SOLN
INTRAMUSCULAR | Status: AC
  Filled 2018-05-13: qty 1

## 2018-05-13 MED ORDER — METHOCARBAMOL 1000 MG/10ML IJ SOLN
500.0000 mg | Freq: Four times a day (QID) | INTRAVENOUS | Status: DC | PRN
  Filled 2018-05-13: qty 5

## 2018-05-13 MED ORDER — ONDANSETRON HCL 4 MG/2ML IJ SOLN
INTRAMUSCULAR | Status: DC | PRN
  Administered 2018-05-13 (×2): 4 mg via INTRAVENOUS

## 2018-05-13 MED ORDER — BUPIVACAINE LIPOSOME 1.3 % IJ SUSP
INTRAMUSCULAR | Status: DC | PRN
  Administered 2018-05-13: 10 mL via PERINEURAL

## 2018-05-13 MED ORDER — PHENOL 1.4 % MT LIQD: 1.0000 | OROMUCOSAL | Status: DC | PRN

## 2018-05-13 MED ORDER — ONDANSETRON HCL 4 MG PO TABS: 4.0000 mg | ORAL_TABLET | Freq: Four times a day (QID) | ORAL | Status: DC | PRN

## 2018-05-13 MED ORDER — FENTANYL CITRATE (PF) 100 MCG/2ML IJ SOLN: 25.0000 ug | INTRAMUSCULAR | Status: DC | PRN

## 2018-05-13 MED ORDER — ROCURONIUM BROMIDE 10 MG/ML (PF) SYRINGE
PREFILLED_SYRINGE | INTRAVENOUS | Status: AC
  Filled 2018-05-13: qty 20

## 2018-05-13 MED ORDER — MIDAZOLAM HCL 2 MG/2ML IJ SOLN
2.0000 mg | Freq: Once | INTRAMUSCULAR | Status: AC
  Administered 2018-05-13: 1 mg via INTRAVENOUS

## 2018-05-13 MED ORDER — AMITRIPTYLINE HCL 50 MG PO TABS
50.0000 mg | ORAL_TABLET | Freq: Every day | ORAL | Status: DC
  Administered 2018-05-13 – 2018-05-15 (×3): 50 mg via ORAL
  Filled 2018-05-13 (×3): qty 1

## 2018-05-13 MED ORDER — LIDOCAINE 2% (20 MG/ML) 5 ML SYRINGE
INTRAMUSCULAR | Status: AC
  Filled 2018-05-13: qty 10

## 2018-05-13 MED ORDER — BUPIVACAINE HCL (PF) 0.5 % IJ SOLN
INTRAMUSCULAR | Status: DC | PRN
  Administered 2018-05-13: 15 mL via PERINEURAL

## 2018-05-13 MED ORDER — FENTANYL CITRATE (PF) 250 MCG/5ML IJ SOLN
INTRAMUSCULAR | Status: AC
Start: 2018-05-13 — End: ?
  Filled 2018-05-13: qty 5

## 2018-05-13 MED ORDER — FAMOTIDINE 20 MG PO TABS
10.0000 mg | ORAL_TABLET | Freq: Every day | ORAL | Status: DC
  Administered 2018-05-13 – 2018-05-16 (×4): 10 mg via ORAL
  Filled 2018-05-13 (×4): qty 1

## 2018-05-13 MED ORDER — SUGAMMADEX SODIUM 200 MG/2ML IV SOLN
INTRAVENOUS | Status: AC
  Filled 2018-05-13: qty 2

## 2018-05-13 MED ORDER — FENTANYL CITRATE (PF) 250 MCG/5ML IJ SOLN
INTRAMUSCULAR | Status: DC | PRN
  Administered 2018-05-13: 50 ug via INTRAVENOUS
  Administered 2018-05-13: 100 ug via INTRAVENOUS
  Administered 2018-05-13 (×2): 50 ug via INTRAVENOUS

## 2018-05-13 MED ORDER — VANCOMYCIN HCL 1000 MG IV SOLR
INTRAVENOUS | Status: DC | PRN
  Administered 2018-05-13: 1000 mg via TOPICAL

## 2018-05-13 MED ORDER — FENTANYL CITRATE (PF) 100 MCG/2ML IJ SOLN
INTRAMUSCULAR | Status: AC
  Administered 2018-05-13: 50 ug via INTRAVENOUS
  Filled 2018-05-13: qty 2

## 2018-05-13 MED ORDER — ONDANSETRON HCL 4 MG/2ML IJ SOLN: 4.0000 mg | Freq: Four times a day (QID) | INTRAMUSCULAR | Status: DC | PRN

## 2018-05-13 MED ORDER — LEVOTHYROXINE SODIUM 88 MCG PO TABS
88.0000 ug | ORAL_TABLET | Freq: Every day | ORAL | Status: DC
  Administered 2018-05-14 – 2018-05-16 (×3): 88 ug via ORAL
  Filled 2018-05-13 (×3): qty 1

## 2018-05-13 MED ORDER — PROPOFOL 10 MG/ML IV BOLUS
INTRAVENOUS | Status: DC | PRN
  Administered 2018-05-13: 120 mg via INTRAVENOUS
  Administered 2018-05-13: 20 mg via INTRAVENOUS
  Administered 2018-05-13: 10 mg via INTRAVENOUS

## 2018-05-13 MED ORDER — ROCURONIUM BROMIDE 100 MG/10ML IV SOLN
INTRAVENOUS | Status: DC | PRN
  Administered 2018-05-13: 60 mg via INTRAVENOUS

## 2018-05-13 MED ORDER — LACTATED RINGERS IV SOLN
INTRAVENOUS | Status: AC
  Administered 2018-05-13: 20:00:00 via INTRAVENOUS

## 2018-05-13 MED ORDER — FUROSEMIDE 20 MG PO TABS
20.0000 mg | ORAL_TABLET | Freq: Every day | ORAL | Status: DC
  Administered 2018-05-13 – 2018-05-16 (×3): 20 mg via ORAL
  Filled 2018-05-13 (×4): qty 1

## 2018-05-13 MED ORDER — PROPOFOL 10 MG/ML IV BOLUS
INTRAVENOUS | Status: AC
  Filled 2018-05-13: qty 20

## 2018-05-13 MED ORDER — METOCLOPRAMIDE HCL 5 MG PO TABS: 5.0000 mg | ORAL_TABLET | Freq: Three times a day (TID) | ORAL | Status: DC | PRN

## 2018-05-13 MED ORDER — MENTHOL 3 MG MT LOZG: 1.0000 | LOZENGE | OROMUCOSAL | Status: DC | PRN

## 2018-05-13 MED ORDER — LACTATED RINGERS IV SOLN
INTRAVENOUS | Status: DC | PRN
  Administered 2018-05-13 (×3): via INTRAVENOUS

## 2018-05-13 MED ORDER — ROCURONIUM BROMIDE 10 MG/ML (PF) SYRINGE
PREFILLED_SYRINGE | INTRAVENOUS | Status: AC
  Filled 2018-05-13: qty 10

## 2018-05-13 MED ORDER — DEXAMETHASONE SODIUM PHOSPHATE 10 MG/ML IJ SOLN
INTRAMUSCULAR | Status: DC | PRN
  Administered 2018-05-13: 10 mg via INTRAVENOUS

## 2018-05-13 MED ORDER — EPHEDRINE SULFATE-NACL 50-0.9 MG/10ML-% IV SOSY
PREFILLED_SYRINGE | INTRAVENOUS | Status: DC | PRN
  Administered 2018-05-13 (×2): 5 mg via INTRAVENOUS
  Administered 2018-05-13: 10 mg via INTRAVENOUS

## 2018-05-13 MED ORDER — ADULT MULTIVITAMIN W/MINERALS CH
1.0000 | ORAL_TABLET | Freq: Every day | ORAL | Status: DC
  Administered 2018-05-13 – 2018-05-16 (×4): 1 via ORAL
  Filled 2018-05-13 (×4): qty 1

## 2018-05-13 MED ORDER — GABAPENTIN 100 MG PO CAPS
200.0000 mg | ORAL_CAPSULE | Freq: Three times a day (TID) | ORAL | Status: DC
  Administered 2018-05-13 – 2018-05-16 (×8): 200 mg via ORAL
  Filled 2018-05-13 (×8): qty 2

## 2018-05-13 MED ORDER — HYDROCODONE-ACETAMINOPHEN 7.5-325 MG PO TABS
1.0000 | ORAL_TABLET | ORAL | Status: DC | PRN
  Administered 2018-05-14 – 2018-05-16 (×5): 1 via ORAL
  Filled 2018-05-13 (×5): qty 1

## 2018-05-13 MED ORDER — SUGAMMADEX SODIUM 500 MG/5ML IV SOLN
INTRAVENOUS | Status: DC | PRN
  Administered 2018-05-13: 250 mg via INTRAVENOUS

## 2018-05-13 MED ORDER — HYDROCODONE-ACETAMINOPHEN 7.5-325 MG PO TABS: 1.0000 | ORAL_TABLET | Freq: Once | ORAL | Status: DC | PRN

## 2018-05-13 MED ORDER — 0.9 % SODIUM CHLORIDE (POUR BTL) OPTIME
TOPICAL | Status: DC | PRN
  Administered 2018-05-13: 1000 mL

## 2018-05-13 MED ORDER — SUMATRIPTAN SUCCINATE 100 MG PO TABS
100.0000 mg | ORAL_TABLET | Freq: Every day | ORAL | Status: DC | PRN
  Filled 2018-05-13: qty 1

## 2018-05-13 MED ORDER — MORPHINE SULFATE (PF) 2 MG/ML IV SOLN
0.5000 mg | INTRAVENOUS | Status: DC | PRN
  Administered 2018-05-14: 1 mg via INTRAVENOUS
  Filled 2018-05-13: qty 1

## 2018-05-13 MED ORDER — METOPROLOL SUCCINATE ER 100 MG PO TB24
100.0000 mg | ORAL_TABLET | Freq: Every day | ORAL | Status: DC
  Administered 2018-05-13 – 2018-05-16 (×3): 100 mg via ORAL
  Filled 2018-05-13 (×4): qty 1

## 2018-05-13 MED ORDER — VANCOMYCIN HCL 1000 MG IV SOLR
INTRAVENOUS | Status: AC
  Filled 2018-05-13: qty 1000

## 2018-05-13 MED ORDER — PHENYLEPHRINE HCL 10 MG/ML IJ SOLN
INTRAMUSCULAR | Status: DC | PRN
  Administered 2018-05-13: 80 ug via INTRAVENOUS
  Administered 2018-05-13: 120 ug via INTRAVENOUS
  Administered 2018-05-13: 80 ug via INTRAVENOUS

## 2018-05-13 MED ORDER — NITROGLYCERIN 0.4 MG SL SUBL
0.4000 mg | SUBLINGUAL_TABLET | SUBLINGUAL | Status: DC | PRN
Start: 2018-05-13 — End: 2018-05-16
  Administered 2018-05-14: 0.4 mg via SUBLINGUAL
  Filled 2018-05-13: qty 1

## 2018-05-13 MED ORDER — PANTOPRAZOLE SODIUM 40 MG PO TBEC
40.0000 mg | DELAYED_RELEASE_TABLET | Freq: Every day | ORAL | Status: DC
  Administered 2018-05-13 – 2018-05-16 (×4): 40 mg via ORAL
  Filled 2018-05-13 (×4): qty 1

## 2018-05-13 SURGICAL SUPPLY — 81 items
ALCOHOL 70% 16 OZ (MISCELLANEOUS) ×3 IMPLANT
BEARING HUMERAL SHLDER 36M STD (Shoulder) IMPLANT
BIT DRILL 2.7 W/STOP DISP (BIT) ×1 IMPLANT
BIT DRILL 2.7MM W/STOP DISP (BIT) ×1
BIT DRILL TWIST 2.7 (BIT) ×1 IMPLANT
BIT DRILL TWIST 2.7MM (BIT) ×1
BLADE SAW SGTL 13X75X1.27 (BLADE) ×3 IMPLANT
BRNG HUM STD 36 RVRS SHLDR (Shoulder) ×1 IMPLANT
BSPLAT GLND LRG AUG TPR ADPR (Joint) ×1 IMPLANT
CLOSURE STERI-STRIP 1/2X4 (GAUZE/BANDAGES/DRESSINGS) ×1
CLOSURE WOUND 1/2 X4 (GAUZE/BANDAGES/DRESSINGS) ×1
CLSR STERI-STRIP ANTIMIC 1/2X4 (GAUZE/BANDAGES/DRESSINGS) ×1 IMPLANT
COMP REV AUG LG W/TAPER/GLENOI (Joint) ×3 IMPLANT
COMPONENT RV AUG LG W/TAPR/GLN (Joint) IMPLANT
COVER SURGICAL LIGHT HANDLE (MISCELLANEOUS) ×3 IMPLANT
DRAPE INCISE IOBAN 66X45 STRL (DRAPES) ×6 IMPLANT
DRAPE U-SHAPE 47X51 STRL (DRAPES) ×6 IMPLANT
DRSG AQUACEL AG ADV 3.5X10 (GAUZE/BANDAGES/DRESSINGS) ×3 IMPLANT
DURAPREP 26ML APPLICATOR (WOUND CARE) ×3 IMPLANT
ELECT BLADE 4.0 EZ CLEAN MEGAD (MISCELLANEOUS) ×3
ELECT REM PT RETURN 9FT ADLT (ELECTROSURGICAL) ×3
ELECTRODE BLDE 4.0 EZ CLN MEGD (MISCELLANEOUS) ×1 IMPLANT
ELECTRODE REM PT RTRN 9FT ADLT (ELECTROSURGICAL) ×1 IMPLANT
GLENOID SPHERE STD STRL 36MM (Orthopedic Implant) ×2 IMPLANT
GLOVE BIOGEL PI IND STRL 7.5 (GLOVE) ×1 IMPLANT
GLOVE BIOGEL PI IND STRL 8 (GLOVE) ×1 IMPLANT
GLOVE BIOGEL PI INDICATOR 7.5 (GLOVE) ×2
GLOVE BIOGEL PI INDICATOR 8 (GLOVE) ×2
GLOVE ECLIPSE 7.0 STRL STRAW (GLOVE) ×3 IMPLANT
GLOVE SURG ORTHO 8.0 STRL STRW (GLOVE) ×3 IMPLANT
GOWN STRL REUS W/ TWL LRG LVL3 (GOWN DISPOSABLE) ×2 IMPLANT
GOWN STRL REUS W/ TWL XL LVL3 (GOWN DISPOSABLE) IMPLANT
GOWN STRL REUS W/TWL LRG LVL3 (GOWN DISPOSABLE) ×6
GOWN STRL REUS W/TWL XL LVL3 (GOWN DISPOSABLE)
HYDROGEN PEROXIDE 16OZ (MISCELLANEOUS) ×3 IMPLANT
KIT BASIN OR (CUSTOM PROCEDURE TRAY) ×3 IMPLANT
KIT TURNOVER KIT B (KITS) ×3 IMPLANT
LOOP VESSEL MAXI BLUE (MISCELLANEOUS) ×3 IMPLANT
MANIFOLD NEPTUNE II (INSTRUMENTS) ×3 IMPLANT
MARKER SKIN DUAL TIP RULER LAB (MISCELLANEOUS) ×2 IMPLANT
MODEL GLENOID TOTAL SIGNATURE (SYSTAGENIX WOUND MANAGEMENT) ×2 IMPLANT
NDL SUT 6 .5 CRC .975X.05 MAYO (NEEDLE) ×1 IMPLANT
NEEDLE MAYO TAPER (NEEDLE) ×3
NS IRRIG 1000ML POUR BTL (IV SOLUTION) ×11 IMPLANT
PACK SHOULDER (CUSTOM PROCEDURE TRAY) ×3 IMPLANT
PAD ARMBOARD 7.5X6 YLW CONV (MISCELLANEOUS) ×6 IMPLANT
PASSER SUT SWANSON 36MM LOOP (INSTRUMENTS) ×3 IMPLANT
PIN THREADED REVERSE (PIN) ×2 IMPLANT
REAMER GUIDE BUSHING SURG DISP (MISCELLANEOUS) ×2 IMPLANT
REAMER GUIDE W/SCREW AUG (MISCELLANEOUS) ×2 IMPLANT
SCREW BONE LOCKING 4.75X35X3.5 (Screw) ×2 IMPLANT
SCREW BONE STRL 6.5MMX25MM (Screw) ×2 IMPLANT
SCREW LOCKING 4.75MMX15MM (Screw) ×4 IMPLANT
SCREW LOCKING STRL 4.75X25X3.5 (Screw) ×2 IMPLANT
SCRUB BETADINE 4OZ XXX (MISCELLANEOUS) ×3 IMPLANT
SHOULDER HUMERAL BEAR 36M STD (Shoulder) ×3 IMPLANT
SLING ARM IMMOBILIZER LRG (SOFTGOODS) ×3 IMPLANT
SOLUTION BETADINE 4OZ (MISCELLANEOUS) ×3 IMPLANT
SPONGE LAP 18X18 X RAY DECT (DISPOSABLE) ×11 IMPLANT
STEM MINI LONG 15MM 83MM (Stem) ×2 IMPLANT
STRIP CLOSURE SKIN 1/2X4 (GAUZE/BANDAGES/DRESSINGS) ×2 IMPLANT
SUCTION FRAZIER HANDLE 10FR (MISCELLANEOUS) ×2
SUCTION TUBE FRAZIER 10FR DISP (MISCELLANEOUS) ×1 IMPLANT
SUT FIBERWIRE #2 38 T-5 BLUE (SUTURE) ×9
SUT MAXBRAID (SUTURE) IMPLANT
SUT MNCRL AB 3-0 PS2 18 (SUTURE) ×3 IMPLANT
SUT SILK 2 0 TIES 10X30 (SUTURE) ×3 IMPLANT
SUT VIC AB 0 CT1 27 (SUTURE) ×15
SUT VIC AB 0 CT1 27XBRD ANBCTR (SUTURE) ×4 IMPLANT
SUT VIC AB 1 CT1 27 (SUTURE) ×9
SUT VIC AB 1 CT1 27XBRD ANBCTR (SUTURE) ×2 IMPLANT
SUT VIC AB 2-0 CT1 27 (SUTURE) ×15
SUT VIC AB 2-0 CT1 TAPERPNT 27 (SUTURE) ×3 IMPLANT
SUT VICRYL 0 AB UR-6 (SUTURE) ×9 IMPLANT
SUT VICRYL 0 UR6 27IN ABS (SUTURE) ×4 IMPLANT
SUTURE FIBERWR #2 38 T-5 BLUE (SUTURE) ×3 IMPLANT
TOWEL OR 17X26 10 PK STRL BLUE (TOWEL DISPOSABLE) ×3 IMPLANT
TRAY FOLEY BAG SILVER LF 16FR (CATHETERS) ×2 IMPLANT
TRAY HUM REV SHOULDER STD +6 (Shoulder) ×2 IMPLANT
WATER STERILE IRR 1000ML POUR (IV SOLUTION) ×3 IMPLANT
YANKAUER SUCT BULB TIP NO VENT (SUCTIONS) ×2 IMPLANT

## 2018-05-13 NOTE — Transfer of Care (Signed)
Immediate Anesthesia Transfer of Care Note  Patient: Nicholas [name]  Procedure(s) Performed: RIGHT REVERSE SHOULDER ARTHROPLASTY (Right Shoulder)  Patient Location: PACU  Anesthesia Type:General  Level of Consciousness: drowsy  Airway & Oxygen Therapy: Patient Spontanous Breathing and Patient connected to face mask oxygen  Post-op Assessment: Report given to RN and Post -op Vital signs reviewed and stable  Post vital signs: Reviewed and stable  Last Vitals:  Vitals Value Taken Time  BP    Temp    Pulse 95 05/13/2018  4:24 PM  Resp 23 05/13/2018  4:25 PM  SpO2 94 % 05/13/2018  4:24 PM  Vitals shown include unvalidated device data.  Last Pain:  Vitals:   05/13/18 0923  PainSc: 6       Patients Stated Pain Goal: 3 (50/53/97 6734)  Complications: No apparent anesthesia complications

## 2018-05-13 NOTE — Plan of Care (Signed)
  Problem: Pain Managment: Goal: General experience of comfort will improve Outcome: Progressing   Problem: Safety: Goal: Ability to remain free from injury will improve Outcome: Progressing   Problem: Skin Integrity: Goal: Risk for impaired skin integrity will decrease Outcome: Progressing   

## 2018-05-13 NOTE — Anesthesia Preprocedure Evaluation (Addendum)
Anesthesia Evaluation  Patient identified by MRN, date of birth, ID band Patient awake    Reviewed: Allergy & Precautions, NPO status , Patient's Chart, lab work & pertinent test results  History of Anesthesia Complications Negative for: history of anesthetic complications  Airway Mallampati: I  TM Distance: >3 FB Neck ROM: Full    Dental  (+) Edentulous Upper, Edentulous Lower   Pulmonary asthma , sleep apnea , pneumonia,    breath sounds clear to auscultation       Cardiovascular (-) angina+ Past MI and +CHF   Rhythm:Regular     Neuro/Psych  Headaches, Seizures -, Well Controlled,  PSYCHIATRIC DISORDERS Anxiety Depression CVA, No Residual Symptoms    GI/Hepatic hiatal hernia, GERD  ,  Endo/Other  Hypothyroidism   Renal/GU Renal InsufficiencyRenal disease     Musculoskeletal  (+) Arthritis ,   Abdominal   Peds  Hematology   Anesthesia Other Findings   Reproductive/Obstetrics                             Anesthesia Physical Anesthesia Plan  ASA: III  Anesthesia Plan: General   Post-op Pain Management:    Induction: Intravenous  PONV Risk Score and Plan: 2 and Treatment may vary due to age or medical condition and Ondansetron  Airway Management Planned: Oral ETT  Additional Equipment:   Intra-op Plan:   Post-operative Plan: Extubation in OR  Informed Consent:   Plan Discussed with: CRNA, Anesthesiologist and Surgeon  Anesthesia Plan Comments: (  )        Anesthesia Quick Evaluation

## 2018-05-13 NOTE — Op Note (Signed)
NAME: Nicholas [name], GAUTHIER MEDICAL RECORD [id number] ACCOUNT [id number] DATE OF BIRTH:[birth year] FACILITY: [facility] LOCATION: [facility] PHYSICIAN:GREGORY Randel Pigg, MD  OPERATIVE REPORT  DATE OF PROCEDURE:  05/13/2018  PREOPERATIVE DIAGNOSIS:  Right shoulder arthritis.  POSTOPERATIVE DIAGNOSIS:  Right shoulder arthritis.  PROCEDURE:  Right reverse shoulder replacement utilizing [employer] comprehensive shoulder system, mini humeral stem size 15 with 36 standard glenosphere, 4 locking screws and one compression screw and large augmented baseplate with standard thickness 6 mm  taper offset 40 mm humeral tray and 36 mm standard bearing cross-linked polyethylene.  INDICATIONS:  The patient is a 67 year old patient with end-stage and severe right shoulder arthritis with type B2 glenoid who presents for operative management after explanation of risks and benefits.  DESCRIPTION OF PROCEDURE:  The patient was brought to the operating room where general anesthetic was induced.  Preoperative antibiotics administered.  Timeout was called.  Right shoulder prescrubbed with hydrogen peroxide followed by alcohol, which was  allowed to air dry and Betadine, which was allowed to air dry.  The arm and hand with DuraPrep and then prepped in a sterile manner.  Ioban used to cover the entire operative field.  Timeout was called.  Deltopectoral approach was made.  Cephalic vein  mobilized medially.  The patient had a very tight shoulder with about 65 degrees of abduction and about 80 of forward flexion and about 10 degrees of external rotation.  The biceps tendon was identified and tenodesed to the pectoralis tendon.  Rotator  interval was opened.  Subacromial and subdeltoid adhesions were released.  The circumflex vessels were ligated.  The axillary nerve was then identified and a vessel loop placed around it.  It was protected at all times during the remaining portion of the  case.  At this time, subscapularis was taken off  around the humeral neck around to the 5 o'clock position.  Capsule was also released, but there was a very large osteophyte protruding inferiorly.  This was carefully removed and the capsule was removed  from around it.  Once this was done, partial release of the supraspinatus was performed in an order for mobilization purposes.  The humerus was then cut in 30 degrees of retroversion.  Adequate cut for visualization was performed back to the attachment  site of the rotator cuff.  Broaching caps were placed.  Attention was then directed towards the glenoid.  The coracohumeral ligament was released and the rotator interval opened all the way to the base of the coracoid.  At this time, the labrum was  excised.  Loose bodies were removed posteriorly.  The patient's specific guide was then placed on the glenoid and the drill guide pin was then placed for the baseplate.  Because of the patient's severe wear, an augmented baseplate was chosen, which was  the maximum baseplate available.  Reaming was performed.  The baseplate was then secured into good position with a central screw and 4 locking screws.  At this time, a 36 glenosphere was placed and attention was directed towards the humerus.  The humerus  was broached and taken a little bit farther down in order to facilitate reduction.  This was done with a 15 broach.  Coplaning was then performed and trial reduction was performed with a +6 offset 40 mm humeral tray.  This gave excellent stability with  extension adduction and forward pressure.  It also allowed him to have about 60 degrees of external rotation and good internal and external rotation.  The shoulder was then  dislocated.  This did take some effort indicating good stability of the  prosthesis.  Trial components were removed.  Thorough irrigation performed.  True components placed with same stability parameters were maintained.  Axillary nerve intact.  Thorough irrigation performed.  Vancomycin powder  placed.  Subscapularis was not  repairable due to severe contraction.  At this time, the deltopectoral interval was closed using #1 Vicryl suture followed by interrupted inverted 0 Vicryl suture, 2-0 Vicryl suture and a 3-0 Monocryl.  The patient tolerated the procedure well without  immediate complications.  He was transferred to the recovery room in stable condition.  Aquacel dressing and shoulder immobilizer placed.  TN/NUANCE  D:05/13/2018 T:05/13/2018 JOB:[id number]

## 2018-05-13 NOTE — Anesthesia Procedure Notes (Signed)
Procedure Name: Intubation Date/Time: 05/13/2018 11:21 AM Performed by: Gayland Curry, CRNA Pre-anesthesia Checklist: Patient identified, Emergency Drugs available, Suction available and Patient being monitored Patient Re-evaluated:Patient Re-evaluated prior to induction Oxygen Delivery Method: Circle system utilized Preoxygenation: Pre-oxygenation with 100% oxygen Induction Type: IV induction Ventilation: Mask ventilation with difficulty, Oral airway inserted - appropriate to patient size and Two handed mask ventilation required Laryngoscope Size: Mac and 4 Grade View: Grade I Tube type: Oral Tube size: 7.0 mm Number of attempts: 1 Placement Confirmation: ETT inserted through vocal cords under direct vision,  positive ETCO2 and breath sounds checked- equal and bilateral Secured at: 23 cm Tube secured with: Tape Dental Injury: Teeth and Oropharynx as per pre-operative assessment

## 2018-05-13 NOTE — Brief Op Note (Signed)
05/13/2018  7:20 PM  PATIENT:  Nicholas [name]  67 y.o. male  PRE-OPERATIVE DIAGNOSIS:  RIGHT SHOULDER OA  POST-OPERATIVE DIAGNOSIS:  RIGHT SHOULDER OA  PROCEDURE:  Procedure(s): RIGHT REVERSE SHOULDER ARTHROPLASTY  SURGEON:  Surgeon(s): [name] Sa, Tonna Corner, MD  ASSISTANT: Darlen Round rnfa  ANESTHESIA:   general  EBL: 250 ml    No intake/output data recorded.  BLOOD ADMINISTERED: none  DRAINS: none   LOCAL MEDICATIONS USED:  none  SPECIMEN:  No Specimen  COUNTS:  YES  TOURNIQUET:  * No tourniquets in log *  DICTATION: .Other Dictation: Dictation Number [id number]  PLAN OF CARE: Admit to inpatient   PATIENT DISPOSITION:  PACU - hemodynamically stable

## 2018-05-13 NOTE — Anesthesia Procedure Notes (Signed)
Anesthesia Regional Block: Interscalene brachial plexus block   Pre-Anesthetic Checklist: ,, timeout performed, Correct Patient, Correct Site, Correct Laterality, Correct Procedure, Correct Position, site marked, Risks and benefits discussed,  Surgical consent,  Pre-op evaluation,  At surgeon's request and post-op pain management  Laterality: Right  Prep: chloraprep       Needles:  Injection technique: Single-shot  Needle Type: Echogenic Stimulator Needle     Needle Length: 5cm  Needle Gauge: 22     Additional Needles:   Procedures:,,,, ultrasound used (permanent image in chart),,,,  Narrative:  Start time: 05/13/2018 10:51 AM End time: 05/13/2018 10:53 AM Injection made incrementally with aspirations every 5 mL.  Performed by: Personally  Anesthesiologist: Oleta Mouse, MD  Additional Notes: Functioning IV was confirmed and monitors applied.  A 55mm 22ga echogenic arrow stimulator was used. Sterile prep and drape,hand hygiene and sterile gloves were used.Ultrasound guidance: relevant anatomy identified, needle position confirmed, local anesthetic spread visualized around nerve(s)., vascular puncture avoided.  Image printed for medical record.  Negative aspiration and negative test dose prior to incremental administration of local anesthetic. The patient tolerated the procedure well.

## 2018-05-13 NOTE — Anesthesia Preprocedure Evaluation (Deleted)
Anesthesia Evaluation  Patient identified by MRN, date of birth, ID band Patient awake    Reviewed: Allergy & Precautions, NPO status , Patient's Chart, lab work & pertinent test results  Airway Mallampati: I  TM Distance: >3 FB Neck ROM: Full   Comment: Full beard Dental  (+) Lower Dentures, Upper Dentures   Pulmonary    Pulmonary exam normal breath sounds clear to auscultation       Cardiovascular + CAD, + Past MI and +CHF  Normal cardiovascular exam Rhythm:Regular Rate:Normal  EKG: 02/04/2018: Normal sinus rhythm. Left axis deviation. Pulmonary disease pattern. Abnormal ECG No significant change since last tracing  CV: Cath 02/04/2018: Normal coronary arteries without any significant coronary artery disease Normal LVEDP Normal LVEF  Chest pain likely noncardiac in origin. Proceed with shoulder surgery with low cardiac risk.  Nuclear Stress 01/03/2018 (outside record, cop in pt chart): The overall quality of the study is fair.  Left ventricular cavity is noted to be normal on the rest and stress studies.  Gated SPECT images reveal normal myocardial thickening and wall motion.  LVEF 48%.  SPECT rest images show a large area of basal to apical inferior, inferior apical, basal inferoseptal and inferolateral perfusion defect of mild to moderate intensity.  These defects may be related to soft tissue attenuation of rest.  However, SPECT stress images only demonstrate a small area of mild intensity perfusion defect in mid to basal inferior myocardium.  This may suggest small area of mild ischemia in the mid to basal inferior myocardium.  Low to intermediate risk study due to reduced LVEF.  Findings may represent nonischemic cardiomyopathy.       Neuro/Psych  Headaches, Seizures -, Well Controlled,  PSYCHIATRIC DISORDERS Anxiety Depression CVA, No Residual Symptoms    GI/Hepatic Neg liver ROS, hiatal hernia, PUD, GERD  Medicated  and Controlled,  Endo/Other  Hypothyroidism Obesity   Renal/GU negative Renal ROS     Musculoskeletal  (+) Arthritis , Osteoarthritis,    Abdominal   Peds  Hematology  (+) Blood dyscrasia, anemia , Plt 178k   Anesthesia Other Findings   Reproductive/Obstetrics                             Anesthesia Physical  Anesthesia Plan  ASA: III  Anesthesia Plan: General   Post-op Pain Management:  Regional for Post-op pain   Induction: Intravenous  PONV Risk Score and Plan: 3 and Midazolam, Dexamethasone, Ondansetron and Treatment may vary due to age or medical condition  Airway Management Planned: Oral ETT  Additional Equipment: None  Intra-op Plan:   Post-operative Plan: Extubation in OR  Informed Consent: I have reviewed the patients History and Physical, chart, labs and discussed the procedure including the risks, benefits and alternatives for the proposed anesthesia with the patient or authorized representative who has indicated his/her understanding and acceptance.   Dental advisory given  Plan Discussed with: CRNA  Anesthesia Plan Comments:         Anesthesia Quick Evaluation

## 2018-05-14 ENCOUNTER — Encounter (HOSPITAL_COMMUNITY): Payer: Self-pay | Admitting: Orthopedic Surgery

## 2018-05-14 ENCOUNTER — Other Ambulatory Visit: Payer: Self-pay | Admitting: Family Medicine

## 2018-05-14 LAB — TROPONIN I

## 2018-05-14 MED ORDER — VANCOMYCIN HCL IN DEXTROSE 1-5 GM/200ML-% IV SOLN
1000.0000 mg | Freq: Once | INTRAVENOUS | Status: AC
  Administered 2018-05-14: 1000 mg via INTRAVENOUS
  Filled 2018-05-14: qty 200

## 2018-05-14 MED ORDER — ALUM & MAG HYDROXIDE-SIMETH 200-200-20 MG/5ML PO SUSP
15.0000 mL | Freq: Once | ORAL | Status: AC
  Administered 2018-05-14: 15 mL via ORAL
  Filled 2018-05-14: qty 30

## 2018-05-14 NOTE — Evaluation (Signed)
Occupational Therapy Evaluation Patient Details Name: Nicholas [name] MRN: [id number] DOB: 11-21-1950 Today's Date: 05/14/2018    History of Present Illness Pt is a 67 y.o. male s/p R reserve total shoulder arthroplasty. PMH significant for anxiety, asthma, CHF, depression, GERD, heart attack, hypothyroidism, MRSA, and migraine.    Clinical Impression   PTA, pt reports independence with adaptive equipment for ADL participation. He is currently limited by R UE pain and limitations per passive shoulder protocol. Pt currently requiring max assist for overall ADL participation and min guard assist for ADL transfers. Initiated education concerning passive protocol HEP as well as compensatory strategies and sling wear. Immobilizer waist strap too small and pt will need an extended one. Pt would benefit from continued OT services while admitted to improve independence and safety with ADL and functional mobility. He is adamant that he does not want to go to post-acute rehabilitation prior to returning home and reports that he will stay with his sister who can provide 24 hour assistance. If she is unable to provide necessary assistance, he may need short-term SNF but hopeful to return home with home health OT follow-up. OT will continue to follow while admitted.     Follow Up Recommendations  Home health OT;Supervision/Assistance - 24 hour    Equipment Recommendations  3 in 1 bedside commode    Recommendations for Other Services       Precautions / Restrictions Precautions Precautions: Shoulder Type of Shoulder Precautions: Passive protocol: Sling at all times except ADL/exercise, NWB RUE, AROM elbow/wrist/hand to tolerance, OK for pendulums, PROM R shoulder (forward flexion: 0-90, abduction: 0-60, external rotation: 0-30), NO AROM R SHOULDER.  Shoulder Interventions: Shoulder sling/immobilizer Precaution Booklet Issued: Yes (comment) Precaution Comments: Reviewed all precautions with pt with  handout provided.  Required Braces or Orthoses: Sling(R shoulder) Restrictions Weight Bearing Restrictions: Yes RLE Weight Bearing: Non weight bearing      Mobility Bed Mobility Overal bed mobility: Needs Assistance Bed Mobility: Supine to Sit     Supine to sit: Mod assist     General bed mobility comments: Mod assist to scoot hips toward EOB.   Transfers Overall transfer level: Needs assistance Equipment used: None Transfers: Sit to/from Stand Sit to Stand: Min guard         General transfer comment: Guarding assist to power up from elevated bed surface.     Balance Overall balance assessment: Needs assistance Sitting-balance support: No upper extremity supported;Feet supported Sitting balance-Leahy Scale: Fair     Standing balance support: No upper extremity supported;During functional activity Standing balance-Leahy Scale: Fair Standing balance comment: Instability noted requiring min guard assist.                            ADL either performed or assessed with clinical judgement   ADL Overall ADL's : Needs assistance/impaired Eating/Feeding: Set up;Sitting   Grooming: Minimal assistance;Sitting Grooming Details (indicate cue type and reason): for bimanual tasks Upper Body Bathing: Maximal assistance;Sitting   Lower Body Bathing: Maximal assistance;Sit to/from stand   Upper Body Dressing : Maximal assistance;Sitting   Lower Body Dressing: Maximal assistance;Sit to/from stand   Toilet Transfer: Min guard;Ambulation Toilet Transfer Details (indicate cue type and reason): simulated with sit<>stand from EOB and mobility in room to recliner Toileting- Clothing Manipulation and Hygiene: Moderate assistance;Sit to/from stand       Functional mobility during ADLs: Min guard General ADL Comments: Initiated education concerning compensatory ADL strategies to maximize  independence and adherance to shoulder precautions.      Vision Baseline  Vision/History: Wears glasses Wears Glasses: At all times Patient Visual Report: No change from baseline Vision Assessment?: No apparent visual deficits     Perception     Praxis      Pertinent Vitals/Pain Pain Assessment: Faces Faces Pain Scale: Hurts a little bit Pain Location: "pressure" in shoulder joint Pain Descriptors / Indicators: Pressure Pain Intervention(s): Limited activity within patient's tolerance;Monitored during session;Repositioned     Hand Dominance Right   Extremity/Trunk Assessment Upper Extremity Assessment Upper Extremity Assessment: RUE deficits/detail RUE Deficits / Details: Pain and pressure within shoulder GH joint per pt. Decreased light touch in digits. Shoulder PROM limited within parameters of protocol.    Lower Extremity Assessment Lower Extremity Assessment: Defer to PT evaluation;RLE deficits/detail RLE Deficits / Details: No flexion of R knee.  RLE Sensation: decreased light touch       Communication Communication Communication: No difficulties   Cognition Arousal/Alertness: Awake/alert Behavior During Therapy: Flat affect Overall Cognitive Status: Within Functional Limits for tasks assessed                                     General Comments       Exercises Exercises: Shoulder Shoulder Exercises Shoulder Flexion: PROM;Right;10 reps;Supine(OT supporting as pt unable due to body habitus) Shoulder ABduction: PROM;Right;10 reps;Seated(0-60 only; achieved ~0-30) Shoulder External Rotation: PROM;Right;10 reps;Seated(0-30 only; achieved ~0-neutral) Elbow Flexion: AROM;Right;10 reps;Seated Elbow Extension: AROM;Right;10 reps;Seated Wrist Flexion: AROM;Right;10 reps;Seated Wrist Extension: AROM;Right;10 reps;Seated Digit Composite Flexion: AROM;Right;10 reps;Seated Composite Extension: AROM;Right;10 reps;Seated   Shoulder Instructions Shoulder Instructions Donning/doffing shirt without moving shoulder: Maximal  assistance Method for sponge bathing under operated UE: Maximal assistance Donning/doffing sling/immobilizer: Maximal assistance Correct positioning of sling/immobilizer: Minimal assistance ROM for elbow, wrist and digits of operated UE: Supervision/safety Sling wearing schedule (on at all times/off for ADL's): Supervision/safety Proper positioning of operated UE when showering: Minimal assistance Positioning of UE while sleeping: Moderate assistance    Home Living Family/patient expects to be discharged to:: Private residence(retirement community) Living Arrangements: Alone(but staying with sister at d/c) Available Help at Discharge: Family;Available 24 hours/day Type of Home: House Home Access: Stairs to enter [employer] of Steps: 4 Entrance Stairs-Rails: Right;Left Home Layout: One level     Bathroom Shower/Tub: Chief Strategy Officer: Standard     Home Equipment: Mudlogger: Reacher;Sock aid Additional Comments: sock aide that sits on the floor      Prior Functioning/Environment Level of Independence: Independent        Comments: Difficult to complete due to pain.         OT Problem List: Decreased strength;Decreased range of motion;Decreased activity tolerance;Impaired balance (sitting and/or standing);Decreased safety awareness;Decreased knowledge of use of DME or AE;Decreased knowledge of precautions;Pain      OT Treatment/Interventions: Self-care/ADL training;Therapeutic exercise;Energy conservation;DME and/or AE instruction;Therapeutic activities;Patient/family education;Balance training    OT Goals(Current goals can be found in the care plan section) Acute Rehab OT Goals Patient Stated Goal: "to go to sister's home not SNF" OT Goal Formulation: With patient Time For Goal Achievement: 05/28/18 Potential to Achieve Goals: Good ADL Goals Pt Will Perform Grooming: with supervision;standing Pt Will Perform  Upper Body Dressing: with min assist;sitting Pt Will Perform Lower Body Dressing: with min assist;sit to/from stand;with adaptive equipment Pt Will Transfer to Toilet: with modified independence;ambulating;regular height toilet Pt  Will Perform Toileting - Clothing Manipulation and hygiene: with modified independence;sit to/from stand Pt/caregiver will Perform Home Exercise Program: Right Upper extremity;With written HEP provided;Independently(Active protocol HEP)  OT Frequency: Min 2X/week   Barriers to D/C:            Co-evaluation              AM-PAC PT "6 Clicks" Daily Activity     Outcome Measure Help from another person eating meals?: None Help from another person taking care of personal grooming?: A Little Help from another person toileting, which includes using toliet, bedpan, or urinal?: A Lot Help from another person bathing (including washing, rinsing, drying)?: A Lot Help from another person to put on and taking off regular upper body clothing?: A Lot Help from another person to put on and taking off regular lower body clothing?: A Lot 6 Click Score: 15   End of Session Equipment Utilized During Treatment: Gait belt;Rolling walker Nurse Communication: Mobility status;Other (comment)(IV leaking)  Activity Tolerance: Patient tolerated treatment well Patient left: in chair;with call bell/phone within reach  OT Visit Diagnosis: Other abnormalities of gait and mobility (R26.89);Muscle weakness (generalized) (M62.81);Pain Pain - Right/Left: Right Pain - part of body: Shoulder                Time: 1039-1105 OT Time Calculation (min): 26 min Charges:  OT General Charges $OT Visit: 1 Visit OT Evaluation $OT Eval Moderate Complexity: 1 Mod OT Treatments $Self Care/Home Management : 8-22 mins  Doristine Section, MS OTR/L  Pager: [phone]   Verdene Creson A Sharnetta Gielow 05/14/2018, 1:58 PM

## 2018-05-14 NOTE — Progress Notes (Signed)
Subjective: Pt stable - had chest pain last pm -it was relieved with nitroglycerin morphine and Maalox.  Patient had preoperative cardiology evaluation which was normal.  Patient is having some shoulder pain but moves his hands.   Objective: Vital signs in last 24 hours: Temp:  [97.9 F (36.6 C)-98.8 F (37.1 C)] 97.9 F (36.6 C) (07/31 3358) Pulse Rate:  [84-99] 85 (07/31 0614) Resp:  [16-22] 16 (07/31 0614) BP: (94-163)/(58-97) 102/64 (07/31 0614) SpO2:  [92 %-99 %] 96 % (07/31 0614) Weight:  [272 lb 4.8 oz (123.5 kg)] 272 lb 4.8 oz (123.5 kg) (07/30 0923)  Intake/Output from previous day: 07/30 0701 - 07/31 0700 In: 2705.1 [I.V.:2505.1; IV Piggyback:200] Out: 1150 [Urine:850; Blood:300] Intake/Output this shift: No intake/output data recorded.  Exam:  Intact pulses distally  Labs: No results for input(s): HGB in the last 72 hours. No results for input(s): WBC, RBC, HCT, PLT in the last 72 hours. No results for input(s): NA, K, CL, CO2, BUN, CREATININE, GLUCOSE, CALCIUM in the last 72 hours. No results for input(s): LABPT, INR in the last 72 hours.  Assessment/Plan: Plan at this time is cardiology evaluation.  First troponin is normal and EKG shows no changes.  Chest pain has resolved.  Continue with occupational therapy this morning for shoulder range of motion.  Patient is on telemetry.   G Scott Dean 05/14/2018, 8:16 AM

## 2018-05-14 NOTE — Consult Note (Signed)
Reason for Consult: Chest pain Referring Physician: Marcene Duos, MD  Nicholas [name] is an 67 y.o. male.  HPI:   67 y/o Caucasian male with normal coronary arteries without any significant CAD on cath in 01/2018- performed as part of pre op workup. Patient underwent reverse right shoulder replacement on 05/13/2018. Patient had an episode of central, retrosternal chest pain on 7/31 early morning, 10/10, nonradiating, associated with shortness of breath. It relieved over a period of one hour, during which he received SL NTG. This morning, he is chest pain free. Multiple EKG's performed during his chest pain show sinus rhythm/tachycardia, no ischemic changes. However, there is right axis deviation. Patient states his pain and shortness of breath is similar to what he has had in the past.   Past Medical History:  Diagnosis Date  . Acute kidney failure 08/2008   "cleared up"  no problems since  . Allergy   . Anxiety   . Arthritis   . Asthma    ?of this no inhaler  . Blood transfusion   . CHF (congestive heart failure) ([place])    ?of this, pt denies  . CLOSTRIDIUM DIFFICILE COLITIS 07/04/2010   Annotation: 12/09, 2/10 Qualifier: Diagnosis of  By: [name], [name]    . Depression   . Depression with anxiety   . Duodenitis determined by biopsy 02/2016   peptic likely due to aleve (erosive gastropathy with duodenal erosions)  . ECZEMA 07/04/2010   Qualifier: Diagnosis of  By: [name], [name]    . Elevated liver enzymes   . GERD (gastroesophageal reflux disease)   . Heart attack ([place])    08/2008 (likely demand ischemia in the setting of MSSA sepsis/R TKA  infection)10-2008  . History of hiatal hernia   . History of stomach ulcers   . Hypothyroidism   . Lower GI bleeding   . MALAR AND MAXILLARY BONES CLOSED FRACTURE 07/04/2010   Annotation: ORIF Qualifier: Diagnosis of  By: [name], [name]    . Migraine    "definitely"  . MRSA (methicillin resistant Staphylococcus aureus)    in leg, had to  place steel rod in leg  . Personal history of colonic adenoma 06/01/2003  . PFO (patent foramen ovale)    small PFO by 08/2008 TEE  . Pneumonia 08/2008   "while in ICU"  . Seizures ([place]) 2009   "long time ago"    . Stroke [facility]) 2010   unable to complete sentences at times    Past Surgical History:  Procedure Laterality Date  . anterior  nerve transposition  07/2009   left ulnar nerve  . antibiotic spacer exchange  11/2008; 08/2006   right knee  . ARTHROTOMY  08/2008   right knee w/I&D  . CARDIAC CATHETERIZATION  02/04/2018  . CARDIOVASCULAR STRESS TEST  2013   stress EKG - negative for ischemia, 4 min 7.3 METs, normal blood pressure response  . CATARACT EXTRACTION    . COLONOSCOPY  11/2012   diverticulosis, rpt 10 yrs [name])  . ESOPHAGOGASTRODUODENOSCOPY  02/2016   erosive gastropathy with duodenal erosions [name])  . HARDWARE REMOVAL  04/2006   right knee w/antibiotic spacers placed  . INGUINAL HERNIA REPAIR  early 1990's   bilateral  . JOINT REPLACEMENT Right    rt knee  . KNEE ARTHROSCOPY  10/2001   right  . KNEE FUSION  03/2009   right knee removal; antibiotic spacers;   . LEFT HEART CATH AND CORONARY ANGIOGRAPHY N/A 02/04/2018  WNL (Patwardhan, Reynold Bowen, MD)  . REPLACEMENT TOTAL KNEE  08/2008; 09/2001   right  . REVERSE SHOULDER ARTHROPLASTY Left 09/03/2017   Procedure: REVERSE SHOULDER ARTHROPLASTY;  Surgeon: Meredith Pel, MD;  Location: [place];  Service: Orthopedics;  Laterality: Left;  . rod placement Right   . SYNOVECTOMY  06/2005   debridement, liner exchange right knee  . TOE AMPUTATION Left 08/2008   great toe - osteomyelitis (staph infection)  . TOE AMPUTATION Left 04/2016   2nd toe [name]  . TONSILLECTOMY    . TOTAL KNEE ARTHROPLASTY Left 01/15/2017  . TOTAL KNEE ARTHROPLASTY Left 01/15/2017   Procedure: TOTAL KNEE ARTHROPLASTY;  Surgeon: Meredith Pel, MD;  Location: [place];  Service: Orthopedics;  Laterality: Left;    Family History   Problem Relation Age of Onset  . Coronary artery disease Father 27  . Stroke Father   . CAD Mother 4       stents  . Breast cancer Sister   . Diabetes Brother   . Diabetes Sister   . Colon cancer Neg Hx   . Stomach cancer Neg Hx     Social History:  reports that he has never smoked. He has never used smokeless tobacco. He reports that he does not drink alcohol or use drugs.  Allergies:  Allergies  Allergen Reactions  . Bee Venom Anaphylaxis and Other (See Comments)    Respiratory Distress  . Peanut-Containing Drug Products Anaphylaxis and Dermatitis  . Celebrex [Celecoxib] Other (See Comments)    BLACK STOOL?MELENA?  [name] Percocet [Oxycodone-Acetaminophen] Hives, Swelling and Other (See Comments)    SWELLING REACTION UNSPECIFIED   . Latex Rash  . Nsaids Other (See Comments)    Disagrees with system    Medications: I have reviewed the patient's current medications.  Results for orders placed or performed during the hospital encounter of 05/13/18 (from the past 48 hour(s))  Troponin I     Status: None   Collection Time: 05/14/18  4:24 AM  Result Value Ref Range   Troponin I <0.03 <0.03 ng/mL    Comment: Performed at [facility] [address], [place], [place] [address]    Dg Shoulder Right Port  Result Date: 05/13/2018 CLINICAL DATA:  Status surgery EXAM: PORTABLE RIGHT SHOULDER COMPARISON:  CT 03/21/2018 FINDINGS: Single AP view of the right shoulder. Right shoulder arthroplasty. No fracture. Gas within the soft tissues. IMPRESSION: Status post right shoulder arthroplasty with expected postoperative changes. Electronically Signed   By: Donavan Foil M.D.   On: 05/13/2018 17:07    Review of Systems  Constitutional: Negative for chills and fever.  HENT: Negative.   Respiratory: Positive for shortness of breath (Resolved).   Cardiovascular: Positive for chest pain (Resolved). Negative for leg swelling.  Genitourinary: Negative.   Musculoskeletal:       S/p  right shoulder replacement on 05/13/2018. No pain  Neurological: Negative for dizziness and loss of consciousness.  Endo/Heme/Allergies: Does not bruise/bleed easily.  All other systems reviewed and are negative.  Blood pressure 102/64, pulse 85, temperature 97.9 F (36.6 C), temperature source Oral, resp. rate 16, height 5\' 9"  (1.753 m), weight 123.5 kg (272 lb 4.8 oz), SpO2 96 %. Physical Exam  Nursing note and vitals reviewed. Constitutional: He is oriented to person, place, and time. He appears well-developed and well-nourished.  HENT:  Head: Normocephalic and atraumatic.  Eyes: Pupils are equal, round, and reactive to light.  Neck: Normal range of motion. No JVD present.  Cardiovascular: Normal rate and regular rhythm.  No murmur heard. Respiratory: Effort normal and breath sounds normal. He has no wheezes. He has no rales.  GI: Soft. Bowel sounds are normal. There is no tenderness.  Musculoskeletal: He exhibits no edema.  Right shoulder post-op  Lymphadenopathy:    He has no cervical adenopathy.  Neurological: He is alert and oriented to person, place, and time.  Skin: Skin is warm and dry.  Psychiatric: He has a normal mood and affect.     Cardiac studies: Echocardiogram 01/09/2018: Mild to mod LVH. LVEF 55%. Mild AI, MR, TR Aortic root 3.9 cm  Nuclear stress test 01/03/2018: Low to intermediate risk test with small area of mild ischemic in mid to basal inferior myocardium  Cath 02/04/2018: Normal coronary arteries without any significant coronary artery disease Normal LVEDP Normal LVEF  EKG 05/14/2018: Sinus tachycardia 103 bpm.. Rightward axis. Poor R wave progression. No ischemic changes. Compared to previous EKG's in 12/2017, and 01/2018, axis is different.  Assessment/Recommendations: 67 y/o Caucasian male, post-op right shoulder replacement, now with retrosternal chest pain  Chest pain: Now resolved. Partially responsive to NTG. EKG with no ischemic  changes. Trop -ve X1. Normal cath in 01/2018. Unlikely to be true unstable angina/NSTEMI. Recommend repeating EKG and trop. If right axis deviation is confirmed (ensuring correct lead placement), consider PE in the differential.  No indication for coronary angiography at this time.   Hypertension: Continue baseline antihypertensive therapy   Manish J Patwardhan 05/14/2018, 9:16 AM   [place], MD [facility] Pager: [phone] Office: [phone] If no answer Cell [phone]

## 2018-05-14 NOTE — Consult Note (Addendum)
[facility] CM Primary Care Navigator  05/14/2018  Nicholas [name] May 22, 1951 [id number]   Met withpatientat the bedsideto identify possible discharge needs. 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 [name] with [facility] as hisprimary care provider.    [facility] [address] [facility].   Patient reportsmanaginghis ownmedications at home, straight out of the containers.  Patient statesthathewasdriving prior to admission but his sister [name]) will beprovidingtransportation to hisdoctors'appointments post discharge.  He [name] himself but states that sister will be able to assist with his care needs when discharge. Patient mentioned that he will be going to his sister's home to recover.   Anticipateddischarge planishomewith home health services per therapy recommendation (refused skilled nursing facility).  Patientvoiced understanding to call primary care provider's officewhenhereturnshomefor a post discharge follow-up within1- 2weeksor sooner if needs arise.Patient letter (with PCP's contact number) was provided asareminder.   Discussed with patientaboutTHN CM services available for health management and resources at home butheindicated not feeling the need for services at this time. Patient verbalized awareness of managing health conditions at home and having a good result of previous surgery to the other shoulder (left).   Encouraged patientto seekreferral from primary care provider to [facility] care management if deemednecessaryand appropriate for services in the near future.  [facility] care management information was provided for future needs thathemay have.  Patient had only  opted and verbally agreed forEMMI calls to follow-upwithhis recovery at home.  Referral made for [facility] General calls after discharge.    For additional questions please contact:  [name], BSN, RN-BC [facility] PRIMARY CARE Navigator Cell: [phone]

## 2018-05-14 NOTE — Progress Notes (Signed)
Physical Therapy Evaluation Patient Details Name: Nicholas [name] MRN: [id number] DOB: [birth year] Today's Date: 05/14/2018   History of Present Illness  Pt is a 67 y.o. male s/p R reserve total shoulder arthroplasty. PMH significant for anxiety, asthma, CHF, depression, GERD, heart attack, hypothyroidism, MRSA, and migraine.     Clinical Impression  Patient is s/p above surgery resulting in functional limitations due to the deficits listed below (see PT Problem List). Pt required mod assist at the trunk in order to perform supine<>sit. Pt was able to perform transfers, ambulation, and stairs with min guard and verbal cues for safety and technique. RN administered pain medication during treatment, secondary to pt request.  Patient will benefit from skilled PT to increase their independence and safety with mobility to allow discharge to sisters home.      Follow Up Recommendations No PT follow up    Equipment Recommendations  None recommended by PT    Recommendations for Other Services       Precautions / Restrictions Precautions Precautions: Shoulder Type of Shoulder Precautions: Passive protocol: Sling at all times except ADL/exercise, NWB RUE, AROM elbow/wrist/hand to tolerance, OK for pendulums, PROM R shoulder (forward flexion: 0-90, abduction: 0-60, external rotation: 0-30), NO AROM R SHOULDER.  Shoulder Interventions: Shoulder sling/immobilizer Precaution Booklet Issued: Yes (comment) Required Braces or Orthoses: Sling(R shoulder) Restrictions Weight Bearing Restrictions: Yes RUE Weight Bearing: Non weight bearing      Mobility  Bed Mobility Overal bed mobility: Needs Assistance Bed Mobility: Supine to Sit;Sit to Supine     Supine to sit: Mod assist Sit to supine: Min guard   General bed mobility comments: Pt required mod A for trunk support in order to sit EOB.  Transfers Overall transfer level: Needs assistance Equipment used: None Transfers: Sit to/from Stand Sit  to Stand: Min guard         General transfer comment: Guarding assist to power up from elevated bed surface.   Ambulation/Gait Ambulation/Gait assistance: Min guard Gait Distance (Feet): 250 Feet Assistive device: None Gait Pattern/deviations: Decreased dorsiflexion - right;Step-through pattern;Antalgic Gait velocity: decreased Gait velocity interpretation: 1.31 - 2.62 ft/sec, indicative of limited community ambulator General Gait Details: Pt unable to flex R knee (baseline) when ambulating due to steel rod in it. Pt requires increased time to ambulate and min guard for safety. Decreased arm swing on the left as pt had a tendency to hold surgical arm with the left non operative arm.   Stairs Stairs: Yes Stairs assistance: Min guard Stair Management: One rail Right;One rail Left;Alternating pattern;Forwards Number of Stairs: 3(performed on stairs in gym ) General stair comments: Pt performed stairs with min guard for safety and technique.  Wheelchair Mobility    Modified Rankin (Stroke Patients Only)       Balance Overall balance assessment: Needs assistance Sitting-balance support: No upper extremity supported;Feet supported Sitting balance-Leahy Scale: Fair     Standing balance support: No upper extremity supported;During functional activity Standing balance-Leahy Scale: Fair Standing balance comment: No LOB noted this session                             Pertinent Vitals/Pain Pain Assessment: 0-10 Pain Score: 10-Worst pain ever Pain Location: "sharp" localized pain in shoulder joint Pain Descriptors / Indicators: Constant;Discomfort;Sharp Pain Intervention(s): Limited activity within patient's tolerance;Monitored during session;RN gave pain meds during session;Ice applied    Home Living Family/patient expects to be discharged to:: Private residence(retirement community) Living  Arrangements: Alone(but staying with sister at d/c) Available Help at  Discharge: Family;Available 24 hours/day Type of Home: House Home Access: Stairs to enter Entrance Stairs-Rails: Psychiatric nurse of Steps: 4 Home Layout: One level Home Equipment: Adaptive equipment Additional Comments: sock aide that sits on the floor    Prior Function Level of Independence: Independent         Comments: Pt lived alone prior to surgery and got around without any assistive device.      Hand Dominance   Dominant Hand: Right    Extremity/Trunk Assessment   Upper Extremity Assessment Upper Extremity Assessment: Defer to OT evaluation    Lower Extremity Assessment RLE Deficits / Details: No flexion of R knee due to steel rod placed in leg.  RLE Sensation: decreased light touch    Cervical / Trunk Assessment Cervical / Trunk Assessment: Normal  Communication   Communication: No difficulties  Cognition Arousal/Alertness: Awake/alert Behavior During Therapy: WFL for tasks assessed/performed Overall Cognitive Status: Within Functional Limits for tasks assessed                                        General Comments      Exercises     Assessment/Plan    PT Assessment Patient needs continued PT services  PT Problem List Decreased strength;Decreased range of motion;Decreased activity tolerance;Decreased balance;Decreased mobility;Decreased coordination;Pain       PT Treatment Interventions Gait training;Stair training;Functional mobility training;Patient/family education;Balance training;Therapeutic exercise;Therapeutic activities    PT Goals (Current goals can be found in the Care Plan section)  Acute Rehab PT Goals Patient Stated Goal: "to go to sister's home not SNF" PT Goal Formulation: With patient Time For Goal Achievement: 05/21/18 Potential to Achieve Goals: Good    Frequency Min 5X/week   Barriers to discharge        Co-evaluation               AM-PAC PT "6 Clicks" Daily Activity  Outcome  Measure Difficulty turning over in bed (including adjusting bedclothes, sheets and blankets)?: Unable Difficulty moving from lying on back to sitting on the side of the bed? : Unable Difficulty sitting down on and standing up from a chair with arms (e.g., wheelchair, bedside commode, etc,.)?: A Little Help needed moving to and from a bed to chair (including a wheelchair)?: A Little Help needed walking in hospital room?: A Little Help needed climbing 3-5 steps with a railing? : A Little 6 Click Score: 14    End of Session Equipment Utilized During Treatment: Gait belt(R arm sling) Activity Tolerance: Patient tolerated treatment well Patient left: in bed;with call bell/phone within reach Nurse Communication: Mobility status PT Visit Diagnosis: Other abnormalities of gait and mobility (R26.89);Pain Pain - Right/Left: Right Pain - part of body: Shoulder    Time: 3662-9476 PT Time Calculation (min) (ACUTE ONLY): 24 min   Charges:              Einar Crow, SPT  Student Physical Therapist Acute Rehab [phone]  Einar Crow 05/14/2018, 2:40 PM

## 2018-05-14 NOTE — Progress Notes (Signed)
Repeat EKG this morning reviewed. Right axis deviation seen on earlier EKG's likely due to incorrect lead placement. Follow trop. If negative, no further cardiopulmonary workup necessary at this time. Consider GERD.  Nigel Mormon, MD [facility] [place] Pager: [phone] Office: [phone] If no answer Cell [phone]

## 2018-05-14 NOTE — Progress Notes (Signed)
Pt complained of tightness, 10/10 pain on L chest area, pain local not radiating to shoulders and arms. Vital signs taken and recorded. Administered PRN nitroglycerin and morphine. MD notified. Received and carried out orders for Maalox once, EKG, cardiac monitoring, lab draw for troponin. Pain eased to 3/10 after 1st dose of nitroglycerin. Rechecked Pt after 10 mins, Pt stated he feels better, stated his pain is 0/10. Will continue to monitor.

## 2018-05-14 NOTE — Anesthesia Postprocedure Evaluation (Signed)
Anesthesia Post Note  Patient: Nicholas [name]  Procedure(s) Performed: RIGHT REVERSE SHOULDER ARTHROPLASTY (Right Shoulder)     Patient location during evaluation: PACU Anesthesia Type: General Level of consciousness: awake and alert Pain management: pain level controlled Vital Signs Assessment: post-procedure vital signs reviewed and stable Respiratory status: spontaneous breathing, nonlabored ventilation, respiratory function stable and patient connected to nasal cannula oxygen Cardiovascular status: blood pressure returned to baseline and stable Postop Assessment: no apparent nausea or vomiting Anesthetic complications: no    Last Vitals:  Vitals:   05/14/18 0336 05/14/18 0614  BP: (!) 94/58 102/64  Pulse: 97 85  Resp:  16  Temp:  36.6 C  SpO2:  96%    Last Pain:  Vitals:   05/14/18 0614  TempSrc: Oral  PainSc: 0-No pain                 Dereke Neumann

## 2018-05-15 NOTE — Progress Notes (Signed)
Subjective: Patient stable.  Pain controlled.   Objective: Vital signs in last 24 hours: Temp:  [97.9 F (36.6 C)-98.4 F (36.9 C)] 98.4 F (36.9 C) (08/01 2008) Pulse Rate:  [86-87] 86 (08/01 2008) Resp:  [18] 18 (08/01 2008) BP: (121-127)/(67-69) 121/69 (08/01 2008) SpO2:  [94 %-98 %] 98 % (08/01 2008)  Intake/Output from previous day: 07/31 0701 - 08/01 0700 In: 1680 [P.O.:1680] Out: 1400 [Urine:1400] Intake/Output this shift: No intake/output data recorded.  Exam:  Intact pulses distally  Labs: No results for input(s): HGB in the last 72 hours. No results for input(s): WBC, RBC, HCT, PLT in the last 72 hours. No results for input(s): NA, K, CL, CO2, BUN, CREATININE, GLUCOSE, CALCIUM in the last 72 hours. No results for input(s): LABPT, INR in the last 72 hours.  Assessment/Plan: Plan at this time is to discharge to home tomorrow.  Prescriptions on chart.   Nicholas [name] 05/15/2018, 10:03 PM

## 2018-05-15 NOTE — Progress Notes (Signed)
Occupational Therapy Treatment Patient Details Name: Nicholas [name] MRN: [id number] DOB: 11-May-1951 Today's Date: 05/15/2018    History of present illness Pt is a 67 y.o. male s/p R reserve total shoulder arthroplasty. PMH significant for anxiety, asthma, CHF, depression, GERD, heart attack, hypothyroidism, MRSA, and migraine.    OT comments  Focus of session on R UE exercises as per passive protocol. Reinforced compensatory strategies for ADL. Pt requiring max assist to stand from recliner this visit. Educated pt to elevate all surfaces at his sister's home. Continues to refuse SNF. Pt went to SNF with his previous shoulder surgery.   Follow Up Recommendations  Home health OT;Supervision/Assistance - 24 hour    Equipment Recommendations  3 in 1 bedside commode    Recommendations for Other Services      Precautions / Restrictions Precautions Precautions: Shoulder;Fall Type of Shoulder Precautions: Passive protocol: Sling at all times except ADL/exercise, NWB RUE, AROM elbow/wrist/hand to tolerance, OK for pendulums, PROM R shoulder (forward flexion: 0-90, abduction: 0-60, external rotation: 0-30), NO AROM R SHOULDER.  Shoulder Interventions: Shoulder sling/immobilizer Precaution Booklet Issued: Yes (comment) Precaution Comments: Reviewed all precautions with pt with handout provided.  Required Braces or Orthoses: Sling Restrictions Weight Bearing Restrictions: Yes RUE Weight Bearing: Non weight bearing       Mobility Bed Mobility               General bed mobility comments: pt received in chair, reminded pt to get up to L side of bed to avoid weight bearing on R UE  Transfers Overall transfer level: Needs assistance Equipment used: 1 person hand held assist Transfers: Sit to/from Stand Sit to Stand: Max assist         General transfer comment: max assist from chair, placed pillow under pt, educated pt to use gait belt with sister to avoid her pulling on his arms and  to elevate all sitting surfaces    Balance Overall balance assessment: Needs assistance   Sitting balance-Leahy Scale: Fair     Standing balance support: Single extremity supported Standing balance-Leahy Scale: Fair Standing balance comment: min guard during pendulums with pt holding on to the back of a chair                           ADL either performed or assessed with clinical judgement   ADL                                         General ADL Comments: Reinforced compensatory strategies for ADL.     Vision       Perception     Praxis      Cognition Arousal/Alertness: Awake/alert Behavior During Therapy: WFL for tasks assessed/performed Overall Cognitive Status: Impaired/Different from baseline Area of Impairment: Memory                     Memory: Decreased short-term memory                  Exercises     Shoulder Instructions       General Comments      Pertinent Vitals/ Pain       Pain Assessment: Faces Faces Pain Scale: Hurts even more Pain Location: R shoulder with exercises Pain Descriptors / Indicators: Sore;Operative site guarding Pain Intervention(s): Ice applied;Repositioned;Limited activity  within patient's tolerance;Monitored during session  Home Living                                          Prior Functioning/Environment              Frequency  Min 2X/week        Progress Toward Goals  OT Goals(current goals can now be found in the care plan section)  Progress towards OT goals: Progressing toward goals  Acute Rehab OT Goals Patient Stated Goal: "to go to sister's home not SNF" OT Goal Formulation: With patient Time For Goal Achievement: 05/28/18 Potential to Achieve Goals: Good  Plan Discharge plan remains appropriate    Co-evaluation                 AM-PAC PT "6 Clicks" Daily Activity     Outcome Measure   Help from another person eating meals?:  None Help from another person taking care of personal grooming?: A Little Help from another person toileting, which includes using toliet, bedpan, or urinal?: A Lot Help from another person bathing (including washing, rinsing, drying)?: A Lot Help from another person to put on and taking off regular upper body clothing?: A Lot Help from another person to put on and taking off regular lower body clothing?: A Lot 6 Click Score: 15    End of Session Equipment Utilized During Treatment: Gait belt  OT Visit Diagnosis: Other abnormalities of gait and mobility (R26.89);Muscle weakness (generalized) (M62.81);Pain Pain - Right/Left: Right Pain - part of body: Shoulder   Activity Tolerance Patient tolerated treatment well   Patient Left in chair;with call bell/phone within reach;with chair alarm set   Nurse Communication          Time: 7939-0300 OT Time Calculation (min): 37 min  Charges: OT General Charges $OT Visit: 1 Visit OT Treatments $Self Care/Home Management : 8-22 mins $Therapeutic Exercise: 8-22 mins  05/15/2018 Nestor Lewandowsky, OTR/L Pager: [phone]   Malka So 05/15/2018, 10:10 AM

## 2018-05-15 NOTE — Care Management Note (Signed)
Case Management Note  Patient Details  Name: Nicholas [name] MRN: [id number] Date of Birth: Feb 19, 1951  Subjective/Objective:  Pt is a 67 y.o. male s/p R reserve total shoulder arthroplasty.                  Action/Plan:  Case manager spoke with patient concerning discharge plan. Choice for [place] was offered, Referral for Home Health was called to [place], [facility] Liaison. Patient will be going to his sister's home to recover: [address] [place], [place] [address], his Cell#[phone].   Expected Discharge Date:  05/16/18               Expected Discharge Plan:  [place]  In-House Referral:  NA  Discharge planning Services  CM Consult  Post Acute Care Choice:  Home Health Choice offered to:  Patient  DME Arranged:  N/A DME Agency:  NA  HH Arranged:  OT, PT [place] Agency:     Status of Service:  Completed, signed off  If discussed at [place] of Stay Meetings, dates discussed:    Additional Comments:  Ninfa Meeker, RN 05/15/2018, 11:10 AM

## 2018-05-16 NOTE — Progress Notes (Signed)
Pt was up in the chair, feeling a lot better today. Right shoulder incision with Aquacel dressing on dry and intact. Pain is controlled. Discharge instructions given to pt, verbalized understanding. Discharged to home, accompanied by his sister.

## 2018-05-16 NOTE — Progress Notes (Signed)
Occupational Therapy Treatment Patient Details Name: Nicholas [name] MRN: [id number] DOB: June 28, 1951 Today's Date: 05/16/2018    History of present illness Pt is a 67 y.o. male s/p R reserve total shoulder arthroplasty. PMH significant for anxiety, asthma, CHF, depression, GERD, heart attack, hypothyroidism, MRSA, and migraine.    OT comments  Pt struggles with R shoulder exercises due to poor strength in his L shoulder. Performed pendulums and then PROM exercises in supine. Reinforced positioning R UE in bed and chair, sling donning and doffing and compensatory strategies for ADL. Pt continues to have difficulty standing, requiring min assist from elevated chair. Educated pt to have sister assist with gait belt and avoid pulling on his arms. Pt verbalizing understanding. Continues to decline SNF and plans to head to sister's home later today.  Follow Up Recommendations  Home health OT;Supervision/Assistance - 24 hour    Equipment Recommendations  3 in 1 bedside commode    Recommendations for Other Services      Precautions / Restrictions Precautions Precautions: Shoulder;Fall Type of Shoulder Precautions: Passive protocol: Sling at all times except ADL/exercise, NWB RUE, AROM elbow/wrist/hand to tolerance, OK for pendulums, PROM R shoulder (forward flexion: 0-90, abduction: 0-60, external rotation: 0-30), NO AROM R SHOULDER.  Shoulder Interventions: Shoulder sling/immobilizer Precaution Booklet Issued: Yes (comment) Required Braces or Orthoses: Sling Restrictions Weight Bearing Restrictions: Yes RUE Weight Bearing: Non weight bearing       Mobility Bed Mobility Overal bed mobility: Needs Assistance Bed Mobility: Sit to Supine       Sit to supine: Supervision   General bed mobility comments: no physical assist, increased time and effort  Transfers Overall transfer level: Needs assistance Equipment used: 1 person hand held assist Transfers: Sit to/from Stand Sit to Stand:  Min assist         General transfer comment: assist to rise and steady from elevated chair    Balance Overall balance assessment: Needs assistance   Sitting balance-Leahy Scale: Fair       Standing balance-Leahy Scale: Fair                             ADL either performed or assessed with clinical judgement   ADL Overall ADL's : Needs assistance/impaired     Grooming: Min guard;Standing;Wash/dry hands           Upper Body Dressing : Minimal assistance;Sitting       Toilet Transfer: Min guard;Ambulation   Toileting- Clothing Manipulation and Hygiene: Min guard(stood to urinate)       Functional mobility during ADLs: Min guard General ADL Comments: Reinforced compensatory strategies for ADL, positioning R LE     Vision       Perception     Praxis      Cognition Arousal/Alertness: Awake/alert Behavior During Therapy: WFL for tasks assessed/performed Overall Cognitive Status: Impaired/Different from baseline Area of Impairment: Memory                     Memory: Decreased short-term memory         General Comments: pt with difficulty recalling exercises        Exercises     Shoulder Instructions       General Comments      Pertinent Vitals/ Pain       Pain Assessment: Faces Faces Pain Scale: Hurts whole lot Pain Location: R shoulder with exercises Pain Descriptors / Indicators: Sore;Operative site guarding;Grimacing;Guarding  Pain Intervention(s): Monitored during session;Premedicated before session;Repositioned  Home Living                                          Prior Functioning/Environment              Frequency  Min 2X/week        Progress Toward Goals  OT Goals(current goals can now be found in the care plan section)  Progress towards OT goals: Progressing toward goals  Acute Rehab OT Goals Patient Stated Goal: "to go to sister's home not SNF" OT Goal Formulation: With  patient Time For Goal Achievement: 05/28/18 Potential to Achieve Goals: Good  Plan Discharge plan remains appropriate    Co-evaluation                 AM-PAC PT "6 Clicks" Daily Activity     Outcome Measure   Help from another person eating meals?: None Help from another person taking care of personal grooming?: A Little Help from another person toileting, which includes using toliet, bedpan, or urinal?: A Lot Help from another person bathing (including washing, rinsing, drying)?: A Lot Help from another person to put on and taking off regular upper body clothing?: A Little Help from another person to put on and taking off regular lower body clothing?: A Lot 6 Click Score: 16    End of Session Equipment Utilized During Treatment: Gait belt  OT Visit Diagnosis: Other abnormalities of gait and mobility (R26.89);Muscle weakness (generalized) (M62.81);Pain Pain - Right/Left: Right Pain - part of body: Shoulder   Activity Tolerance Patient tolerated treatment well   Patient Left in bed;with call bell/phone within reach   Nurse Communication          Time: 9480-1655 OT Time Calculation (min): 24 min  Charges: OT General Charges $OT Visit: 1 Visit OT Treatments $Self Care/Home Management : 8-22 mins $Therapeutic Exercise: 8-22 mins  05/16/2018 Nestor Lewandowsky, OTR/L Pager: [phone]  Malka So 05/16/2018, 10:55 AM

## 2018-05-18 DIAGNOSIS — I509 Heart failure, unspecified: Secondary | ICD-10-CM | POA: Diagnosis not present

## 2018-05-18 DIAGNOSIS — Z96653 Presence of artificial knee joint, bilateral: Secondary | ICD-10-CM | POA: Diagnosis not present

## 2018-05-18 DIAGNOSIS — G43909 Migraine, unspecified, not intractable, without status migrainosus: Secondary | ICD-10-CM | POA: Diagnosis not present

## 2018-05-18 DIAGNOSIS — K219 Gastro-esophageal reflux disease without esophagitis: Secondary | ICD-10-CM | POA: Diagnosis not present

## 2018-05-18 DIAGNOSIS — Z96612 Presence of left artificial shoulder joint: Secondary | ICD-10-CM | POA: Diagnosis not present

## 2018-05-18 DIAGNOSIS — Z96611 Presence of right artificial shoulder joint: Secondary | ICD-10-CM | POA: Diagnosis not present

## 2018-05-18 DIAGNOSIS — Z471 Aftercare following joint replacement surgery: Secondary | ICD-10-CM | POA: Diagnosis not present

## 2018-05-18 DIAGNOSIS — F418 Other specified anxiety disorders: Secondary | ICD-10-CM | POA: Diagnosis not present

## 2018-05-18 DIAGNOSIS — Z9181 History of falling: Secondary | ICD-10-CM | POA: Diagnosis not present

## 2018-05-18 DIAGNOSIS — E039 Hypothyroidism, unspecified: Secondary | ICD-10-CM | POA: Diagnosis not present

## 2018-05-18 DIAGNOSIS — Z86010 Personal history of colonic polyps: Secondary | ICD-10-CM | POA: Diagnosis not present

## 2018-05-18 DIAGNOSIS — J45909 Unspecified asthma, uncomplicated: Secondary | ICD-10-CM | POA: Diagnosis not present

## 2018-05-19 ENCOUNTER — Telehealth (INDEPENDENT_AMBULATORY_CARE_PROVIDER_SITE_OTHER): Payer: Self-pay | Admitting: Orthopedic Surgery

## 2018-05-19 NOTE — Telephone Encounter (Signed)
I called Nicholas [name] and advised. 

## 2018-05-19 NOTE — Telephone Encounter (Signed)
Nicholas [name] (PT) with  [facility] called needing verbal orders of HHPT 3 wk 1 and 2 wk 1   The number to contact Nicholas [name] is [phone]

## 2018-05-23 DIAGNOSIS — K219 Gastro-esophageal reflux disease without esophagitis: Secondary | ICD-10-CM | POA: Diagnosis not present

## 2018-05-23 DIAGNOSIS — J45909 Unspecified asthma, uncomplicated: Secondary | ICD-10-CM | POA: Diagnosis not present

## 2018-05-23 DIAGNOSIS — Z86010 Personal history of colonic polyps: Secondary | ICD-10-CM | POA: Diagnosis not present

## 2018-05-23 DIAGNOSIS — Z9181 History of falling: Secondary | ICD-10-CM | POA: Diagnosis not present

## 2018-05-23 DIAGNOSIS — G43909 Migraine, unspecified, not intractable, without status migrainosus: Secondary | ICD-10-CM | POA: Diagnosis not present

## 2018-05-23 DIAGNOSIS — Z96612 Presence of left artificial shoulder joint: Secondary | ICD-10-CM | POA: Diagnosis not present

## 2018-05-23 DIAGNOSIS — E039 Hypothyroidism, unspecified: Secondary | ICD-10-CM | POA: Diagnosis not present

## 2018-05-23 DIAGNOSIS — Z96653 Presence of artificial knee joint, bilateral: Secondary | ICD-10-CM | POA: Diagnosis not present

## 2018-05-23 DIAGNOSIS — Z471 Aftercare following joint replacement surgery: Secondary | ICD-10-CM | POA: Diagnosis not present

## 2018-05-23 DIAGNOSIS — I509 Heart failure, unspecified: Secondary | ICD-10-CM | POA: Diagnosis not present

## 2018-05-23 DIAGNOSIS — Z96611 Presence of right artificial shoulder joint: Secondary | ICD-10-CM | POA: Diagnosis not present

## 2018-05-23 DIAGNOSIS — F418 Other specified anxiety disorders: Secondary | ICD-10-CM | POA: Diagnosis not present

## 2018-05-24 DIAGNOSIS — J45909 Unspecified asthma, uncomplicated: Secondary | ICD-10-CM | POA: Diagnosis not present

## 2018-05-24 DIAGNOSIS — K219 Gastro-esophageal reflux disease without esophagitis: Secondary | ICD-10-CM | POA: Diagnosis not present

## 2018-05-24 DIAGNOSIS — G43909 Migraine, unspecified, not intractable, without status migrainosus: Secondary | ICD-10-CM | POA: Diagnosis not present

## 2018-05-24 DIAGNOSIS — I509 Heart failure, unspecified: Secondary | ICD-10-CM | POA: Diagnosis not present

## 2018-05-24 DIAGNOSIS — F418 Other specified anxiety disorders: Secondary | ICD-10-CM | POA: Diagnosis not present

## 2018-05-24 DIAGNOSIS — Z86010 Personal history of colonic polyps: Secondary | ICD-10-CM | POA: Diagnosis not present

## 2018-05-24 DIAGNOSIS — E039 Hypothyroidism, unspecified: Secondary | ICD-10-CM | POA: Diagnosis not present

## 2018-05-24 DIAGNOSIS — Z471 Aftercare following joint replacement surgery: Secondary | ICD-10-CM | POA: Diagnosis not present

## 2018-05-24 DIAGNOSIS — Z96653 Presence of artificial knee joint, bilateral: Secondary | ICD-10-CM | POA: Diagnosis not present

## 2018-05-24 DIAGNOSIS — Z9181 History of falling: Secondary | ICD-10-CM | POA: Diagnosis not present

## 2018-05-24 DIAGNOSIS — Z96612 Presence of left artificial shoulder joint: Secondary | ICD-10-CM | POA: Diagnosis not present

## 2018-05-24 DIAGNOSIS — Z96611 Presence of right artificial shoulder joint: Secondary | ICD-10-CM | POA: Diagnosis not present

## 2018-05-26 ENCOUNTER — Encounter: Payer: Self-pay | Admitting: Family Medicine

## 2018-05-26 ENCOUNTER — Ambulatory Visit (INDEPENDENT_AMBULATORY_CARE_PROVIDER_SITE_OTHER): Payer: PPO | Admitting: Family Medicine

## 2018-05-26 VITALS — BP 118/66 | HR 60 | Temp 98.2°F | Ht 69.0 in | Wt 264.5 lb

## 2018-05-26 DIAGNOSIS — E039 Hypothyroidism, unspecified: Secondary | ICD-10-CM

## 2018-05-26 DIAGNOSIS — R21 Rash and other nonspecific skin eruption: Secondary | ICD-10-CM | POA: Diagnosis not present

## 2018-05-26 DIAGNOSIS — Z96611 Presence of right artificial shoulder joint: Secondary | ICD-10-CM | POA: Diagnosis not present

## 2018-05-26 LAB — T4, FREE: FREE T4: 1 ng/dL (ref 0.60–1.60)

## 2018-05-26 LAB — TSH: TSH: 7.27 u[IU]/mL — ABNORMAL HIGH (ref 0.35–4.50)

## 2018-05-26 MED ORDER — DESONIDE 0.05 % EX CREA: TOPICAL_CREAM | Freq: Two times a day (BID) | CUTANEOUS | 0 refills | Status: DC

## 2018-05-26 MED ORDER — KETOCONAZOLE 2 % EX SHAM: 1.0000 "application " | MEDICATED_SHAMPOO | CUTANEOUS | 0 refills | Status: DC

## 2018-05-26 MED ORDER — CLOTRIMAZOLE 1 % EX CREA: 1.0000 "application " | TOPICAL_CREAM | Freq: Two times a day (BID) | CUTANEOUS | 1 refills | Status: DC

## 2018-05-26 NOTE — Assessment & Plan Note (Signed)
Anticipate moderate case of seborrheic dermatitis into beard.  Treat with desonide + nizoral shampoo.  If desonide unaffordable, rec use OTC cortisone-10.  Update if not improving with treatment. Pt agrees with plan.

## 2018-05-26 NOTE — Assessment & Plan Note (Signed)
Recovering remarkably well.

## 2018-05-26 NOTE — Progress Notes (Signed)
BP 118/66 (BP Location: Left Arm, Patient Position: Sitting, Cuff Size: Large)   Pulse 60   Temp 98.2 F (36.8 C) (Oral)   Ht 5\' 9"  (1.753 m)   Wt 264 lb 8 oz (120 kg)   SpO2 95%   BMI 39.06 kg/m    CC: 6 mo f/u visit Subjective:    Patient ID: Nicholas [name], male    DOB: 08-07-1951, 67 y.o.   MRN: [id number]  HPI: Nicholas [name] is a 67 y.o. male presenting on 05/26/2018 for 6 mo follow up (Wants to make sure he no longer has staph inf in nose.) and Rash (Has a rash on his face. States he has has used a prescribed shampoo in the past that helped. Not sure if given by Dr. Darnell Level. )   Hypothyroidism - regimen increased to 57mcg daily 02/2018 - due for recheck. Denies heat or cold intolerance, weight changes, constipation. Some diarrhea.   R reverse shoulder replacement 05/13/2018 [name]. Recovering wonderfully well. Has [facility].   Facial rash - around eyebrows, nose and into beard. Itchy and tender. Has tried nothing for this yet. Prior medicine tried worked well.   Also has itchy tender rash in groin, sometimes bleeds from scratching.   He did test positive for staph aureus in nasal swab, but MRSA PCR returned negative. He was treated with mupirocin.  Relevant past medical, surgical, family and social history reviewed and updated as indicated. Interim medical history since our last visit reviewed. Allergies and medications reviewed and updated. Outpatient Medications Prior to Visit  Medication Sig Dispense Refill  . acetaminophen (TYLENOL) 500 MG tablet Take 1,000 mg by mouth 2 (two) times daily. IN THE AFTERNOON & AT NIGHT    . amitriptyline (ELAVIL) 50 MG tablet Take 2 tablets (100 mg total) by mouth at bedtime. 60 tablet 1  . furosemide (LASIX) 20 MG tablet TAKE 1 TABLET (20 MG TOTAL) BY MOUTH DAILY AS NEEDED FOR FLUID OR EDEMA. (Patient taking differently: Take 20 mg by mouth daily. ) 30 tablet 3  . gabapentin (NEURONTIN) 300 MG capsule TAKE ONE CAPSULE BY MOUTH 4 TIMES A DAY  (Patient taking differently: TAKE 1 CAPSULE (300 MG) BY MOUTH IN THE MORNING, TAKE 2 CAPSULES (600 MG) BY MOUTH IN THE AFTERNOON, & TAKE 1 CAPSULE (300 MG) BY MOUTH AT BEDTIME.) 360 capsule 1  . HYDROcodone-acetaminophen (NORCO) 10-325 MG tablet Take 1 tablet by mouth every 4 (four) hours as needed. Take 1 tablet every 4-6 hours as needed for pain  0  . hydrocortisone 2.5 % cream Apply 1 application topically at bedtime as needed (for rash).     [name] levothyroxine (SYNTHROID, LEVOTHROID) 88 MCG tablet Take 1 tablet (88 mcg total) by mouth daily before breakfast. 90 tablet 1  . metoprolol succinate (TOPROL-XL) 100 MG 24 hr tablet Take 100 mg by mouth daily. Take with or immediately following a meal.    . Multiple Vitamin (MULTIVITAMIN WITH MINERALS) TABS tablet Take 1 tablet by mouth daily. Centrum Silver    . nitroGLYCERIN (NITROSTAT) 0.4 MG SL tablet Place 0.4 mg under the tongue every 5 (five) minutes as needed for chest pain.    . pantoprazole (PROTONIX) 40 MG tablet TAKE 1 TABLET EVERY DAY (Patient taking differently: Take 40 mg by mouth daily) 30 tablet 11  . Probiotic Product (PROBIOTIC PO) Take 1 capsule daily by mouth.    . ranitidine (ZANTAC) 150 MG tablet TAKE 1 TABLET (150 MG TOTAL) BY MOUTH AT  BEDTIME. 30 tablet 0  . SUMAtriptan (IMITREX) 100 MG tablet Take 1 tablet (100 mg total) by mouth daily as needed for migraine (repeat in 2 hours if needed). Limit to 2/24 hour period 12 tablet 1  . triamcinolone cream (KENALOG) 0.5 % Apply 1 application topically 2 (two) times daily. (Patient taking differently: Apply 1 application topically 2 (two) times daily as needed (for rash). ) 30 g 0  . aspirin 81 MG tablet Take 1 tablet (81 mg total) by mouth daily. (Patient not taking: Reported on 05/26/2018)    . amitriptyline (ELAVIL) 25 MG tablet Take 50 mg by mouth at bedtime.    . ferrous sulfate 325 (65 FE) MG EC tablet Take 1 tablet (325 mg total) by mouth daily with breakfast. (Patient not taking:  Reported on 04/29/2018)  3   No facility-administered medications prior to visit.      Per HPI unless specifically indicated in ROS section below Review of Systems     Objective:    BP 118/66 (BP Location: Left Arm, Patient Position: Sitting, Cuff Size: Large)   Pulse 60   Temp 98.2 F (36.8 C) (Oral)   Ht 5\' 9"  (1.753 m)   Wt 264 lb 8 oz (120 kg)   SpO2 95%   BMI 39.06 kg/m   Wt Readings from Last 3 Encounters:  05/26/18 264 lb 8 oz (120 kg)  05/13/18 272 lb 4.8 oz (123.5 kg)  05/05/18 272 lb 4.8 oz (123.5 kg)    Physical Exam  Constitutional: He appears well-developed and well-nourished. No distress.  HENT:  Mouth/Throat: Oropharynx is clear and moist. No oropharyngeal exudate.  Skin: Skin is warm and dry. Rash noted. There is erythema.  1. Erythematous scaly rash at eyebrows, nose, around mouth into beard. Scabbed areas on beard due to excoriations 2. Erythematous scaly rash bilateral inner thighs, scrotal sparing  Nursing note and vitals reviewed.  Results for orders placed or performed during the hospital encounter of 05/13/18  Troponin I  Result Value Ref Range   Troponin I <0.03 <0.03 ng/mL  Troponin I  Result Value Ref Range   Troponin I <0.03 <0.03 ng/mL   Lab Results  Component Value Date   TSH 8.24 (H) 02/25/2018   T3TOTAL 141.0 09/26/2016      Assessment & Plan:   Problem List Items Addressed This Visit    Status post reverse total replacement of right shoulder    Recovering remarkably well.      Skin rash    Groin rash - anticipate tinea cruris/intertrigo. Treat with clotrimazole BID x 2 wks. Update if not improving, consider powder vs oral med.       Hypothyroidism - Primary    Update TFTs on levothyroxine 71mcg daily. Occasional diarrhea, otherwise denies thyroid symptoms.       Relevant Orders   TSH   T4, free   Facial rash    Anticipate moderate case of seborrheic dermatitis into beard.  Treat with desonide + nizoral shampoo.  If  desonide unaffordable, rec use OTC cortisone-10.  Update if not improving with treatment. Pt agrees with plan.           Meds ordered this encounter  Medications  . desonide (DESOWEN) 0.05 % cream    Sig: Apply topically 2 (two) times daily. Apply to AA. Limit to 7-10 days at a time    Dispense:  30 g    Refill:  0  . ketoconazole (NIZORAL) 2 % shampoo  Sig: Apply 1 application topically 2 (two) times a week.    Dispense:  120 mL    Refill:  0  . clotrimazole (LOTRIMIN) 1 % cream    Sig: Apply 1 application topically 2 (two) times daily. To groin    Dispense:  60 g    Refill:  1   Orders Placed This Encounter  Procedures  . TSH    Standing Status:   Future    Standing Expiration Date:   05/27/2019  . T4, free    Standing Status:   Future    Standing Expiration Date:   05/27/2019    Follow up plan: Return in about 6 months (around 11/26/2018), or if symptoms worsen or fail to improve, for annual exam, prior fasting for blood work, medicare wellness visit.  [name], MD

## 2018-05-26 NOTE — Assessment & Plan Note (Signed)
Update TFTs on levothyroxine 53mcg daily. Occasional diarrhea, otherwise denies thyroid symptoms.

## 2018-05-26 NOTE — Assessment & Plan Note (Signed)
Groin rash - anticipate tinea cruris/intertrigo. Treat with clotrimazole BID x 2 wks. Update if not improving, consider powder vs oral med.

## 2018-05-26 NOTE — Addendum Note (Signed)
Addended by: Ellamae Sia on: 05/26/2018 12:14 PM   Modules accepted: Orders

## 2018-05-26 NOTE — Patient Instructions (Addendum)
I think this is seborrheic dermatitis - treat with antifungal shampoo (nizoral) to face (caution around eyes) and low potency steroid desonide (if unaffordable, may use OTC cortisone-10). Don't use steroid more than 7-10 days at a time.  Thyroid check today.  You are doing well today.  Return as needed or in 6 months for physical/wellness visit.  Use clotrimazole antifungal cream to groin rash - twice daily.   Intertrigo Intertrigo is skin irritation or inflammation (dermatitis) that occurs when folds of skin rub together. The irritation can cause a rash and make skin raw and itchy. This condition most commonly occurs in the skin folds of these areas:  Toes.  Armpits.  Groin.  Belly.  Breasts.  Buttocks.  Intertrigo is not passed from person to person (is not contagious). What are the causes? This condition is caused by heat, moisture, friction, and lack of air circulation. The condition can be made worse by:  Sweat.  Bacteria or a fungus, such as yeast.  What increases the risk? This condition is more likely to occur if you have moisture in your skin folds. It is also more likely to develop in people who:  Have diabetes.  Are overweight.  Are on bed rest.  Live in a warm and moist climate.  Wear splints, braces, or other medical devices.  Are not able to control their bowels or bladder (have incontinence).  What are the signs or symptoms? Symptoms of this condition include:  A pink or red skin rash.  Brown patches on the skin.  Raw or scaly skin.  Itchiness.  A burning feeling.  Bleeding.  Leaking fluid.  A bad smell.  How is this diagnosed? This condition is diagnosed with a medical history and physical exam. You may also have a skin swab to test for bacteria or a fungus, such as yeast. How is this treated? Treatment may include:  Cleaning and drying your skin.  An oral antibiotic medicine or antibiotic skin cream for a bacterial  infection.  Antifungal cream or pills for an infection that was caused by a fungus, such as yeast.  Steroid ointment to relieve itchiness and irritation.  Follow these instructions at home:  Keep the affected area clean and dry.  Do not scratch your skin.  Stay in a cool environment as much as possible. Use an air conditioner or fan, if available.  Apply over-the-counter and prescription medicines only as told by your health care provider.  If you were prescribed an antibiotic medicine, use it as told by your health care provider. Do not stop using the antibiotic even if your condition improves.  Keep all follow-up visits as told by your health care provider. This is important. How is this prevented?  Maintain a healthy weight.  Take care of your feet, especially if you have diabetes. Foot care includes: ? Wearing shoes that fit well. ? Keeping your feet dry. ? Wearing clean, breathable socks.  Protect the skin around your groin and buttocks, especially if you have incontinence. Skin protection includes: ? Following a regular cleaning routine. ? Using moisturizers and skin protectants. ? Changing protection pads frequently.  Do not wear tight clothes. Wear clothes that are loose and absorbent. Wear clothes that are made of cotton.  Wear a bra that gives good support, if needed.  Shower and dry yourself thoroughly after activity. Use a hair dryer on a cool setting to dry between skin folds, especially after you bathe.  If you have diabetes, keep your blood  sugar under control. Contact a health care provider if:  Your symptoms do not improve with treatment.  Your symptoms get worse or they spread.  You notice increased redness and warmth.  You have a fever. This information is not intended to replace advice given to you by your health care provider. Make sure you discuss any questions you have with your health care provider. Document Released: 10/01/2005 Document  Revised: 03/08/2016 Document Reviewed: 04/04/2015 Elsevier Interactive Patient Education  2018 [employer].

## 2018-05-28 ENCOUNTER — Ambulatory Visit (INDEPENDENT_AMBULATORY_CARE_PROVIDER_SITE_OTHER): Payer: PPO

## 2018-05-28 ENCOUNTER — Encounter (INDEPENDENT_AMBULATORY_CARE_PROVIDER_SITE_OTHER): Payer: Self-pay | Admitting: Orthopedic Surgery

## 2018-05-28 ENCOUNTER — Ambulatory Visit (INDEPENDENT_AMBULATORY_CARE_PROVIDER_SITE_OTHER): Payer: PPO | Admitting: Orthopedic Surgery

## 2018-05-28 DIAGNOSIS — Z96611 Presence of right artificial shoulder joint: Secondary | ICD-10-CM | POA: Diagnosis not present

## 2018-05-28 NOTE — Progress Notes (Signed)
Post-Op Visit Note   Patient: Nicholas [name]           Date of Birth: 01-23-1951           MRN: [id number] Visit Date: 05/28/2018 PCP: [name], MD   Assessment & Plan:  Chief Complaint:  Chief Complaint  Patient presents with  . Right Shoulder - Routine Post Op   Visit Diagnoses:  1. Status post reverse total arthroplasty of right shoulder     Plan: Nicholas [name] is a patient who is now 2 weeks out reverse shoulder replacement on the right.  He is doing well and is pleased with progress.  He is on a CPM machine at 107.  He had really has not having any pain.  He is finished his home health physical therapy.  On examination he is got improving passive and active range of motion approaching 90 degrees.  Shoulder moves well and axillary nerve is functional plan is to continue CPM machine and 6-week return for final check.  Radiographs look good today.  Follow-Up Instructions: Return in about 6 weeks (around 07/09/2018).   Orders:  Orders Placed This Encounter  Procedures  . XR Shoulder Right   No orders of the defined types were placed in this encounter.   Imaging: Xr Shoulder Right  Result Date: 05/28/2018 AP outlet lateral right shoulder reviewed.  Reverse shoulder replacement in good position and alignment.  No complicating features.  Appears generally symmetric with the left shoulder.   PMFS History: Patient Active Problem List   Diagnosis Date Noted  . Facial rash 05/26/2018  . Shoulder arthritis 05/13/2018  . Status post reverse total replacement of right shoulder 05/13/2018  . Pain and swelling of lower leg, right 03/20/2018  . Epigastric pain 02/26/2018  . Acute on chronic congestive heart failure (Nicholas [name]) 12/27/2017  . OSA (obstructive sleep apnea) 12/27/2017  . Health maintenance examination 11/25/2017  . Renal insufficiency 10/01/2017  . Fatty liver 10/01/2017  . Status post reverse total replacement of left shoulder 09/03/2017  . Bilateral shoulder pain  05/24/2017  . History of CVA in adulthood 03/08/2017  . S/P total knee arthroplasty, left 01/30/2017  . Arthritis of knee 01/15/2017  . Skin rash 12/10/2016  . Medicare annual wellness visit, initial 10/05/2016  . Advanced care planning/counseling discussion 10/05/2016  . Severe obesity (BMI 35.0-39.9) with comorbidity ([place]) 07/19/2016  . Hypothyroidism   . IDA (iron deficiency anemia) 04/23/2016  . Chronic pain syndrome 04/23/2016  . Inflammatory arthritis 04/23/2016  . Chronic leg pain 04/23/2016  . Lesion of palate 04/23/2016  . Grieving 04/23/2016  . GERD (gastroesophageal reflux disease)   . Duodenitis determined by biopsy 02/13/2016  . Diverticulosis of colon without hemorrhage 01/31/2016  . Chest pain 01/11/2012  . Migraine 01/11/2012  . History of methicillin resistant staphylococcus aureus (MRSA) 07/04/2010  . DEGENERATIVE DISC DISEASE 07/04/2010  . History of total right knee replacement 07/04/2010  . Primary osteoarthritis, right shoulder 06/09/2007  . Personal history of colonic adenoma 06/01/2003   Past Medical History:  Diagnosis Date  . Acute kidney failure 08/2008   "cleared up"  no problems since  . Allergy   . Anxiety   . Arthritis   . Asthma    ?of this no inhaler  . Blood transfusion   . CHF (congestive heart failure) ([place])    ?of this, pt denies  . CLOSTRIDIUM DIFFICILE COLITIS 07/04/2010   Annotation: 12/09, 2/10 Qualifier: Diagnosis of  By: Nicholas Salon MD, Nicholas [name]    .  Depression   . Depression with anxiety   . Duodenitis determined by biopsy 02/2016   peptic likely due to aleve (erosive gastropathy with duodenal erosions)  . ECZEMA 07/04/2010   Qualifier: Diagnosis of  By: Nicholas Salon MD, Nicholas [name]    . Elevated liver enzymes   . GERD (gastroesophageal reflux disease)   . Heart attack ([place])    08/2008 (likely demand ischemia in the setting of MSSA sepsis/R TKA  infection)10-2008  . History of hiatal hernia   . History of stomach ulcers   . Hypothyroidism   .  Lower GI bleeding   . MALAR AND MAXILLARY BONES CLOSED FRACTURE 07/04/2010   Annotation: ORIF Qualifier: Diagnosis of  By: Nicholas Salon MD, Nicholas [name]    . Migraine    "definitely"  . MRSA (methicillin resistant Staphylococcus aureus)    in leg, had to place steel rod in leg  . Personal history of colonic adenoma 06/01/2003  . PFO (patent foramen ovale)    small PFO by 08/2008 TEE  . Pneumonia 08/2008   "while in ICU"  . Seizures ([place]) 2009   "long time ago"    . Stroke [facility]) 2010   unable to complete sentences at times    Family History  Problem Relation Age of Onset  . Coronary artery disease Father 35  . Stroke Father   . CAD Mother 25       stents  . Breast cancer Sister   . Diabetes Brother   . Diabetes Sister   . Colon cancer Neg Hx   . Stomach cancer Neg Hx     Past Surgical History:  Procedure Laterality Date  . anterior  nerve transposition  07/2009   left ulnar nerve  . antibiotic spacer exchange  11/2008; 08/2006   right knee  . ARTHROTOMY  08/2008   right knee w/I&D  . CARDIAC CATHETERIZATION  02/04/2018  . CARDIOVASCULAR STRESS TEST  2013   stress EKG - negative for ischemia, 4 min 7.3 METs, normal blood pressure response  . CATARACT EXTRACTION    . COLONOSCOPY  11/2012   diverticulosis, rpt 10 yrs Nicholas [name])  . ESOPHAGOGASTRODUODENOSCOPY  02/2016   erosive gastropathy with duodenal erosions Nicholas [name])  . HARDWARE REMOVAL  04/2006   right knee w/antibiotic spacers placed  . INGUINAL HERNIA REPAIR  early 1990's   bilateral  . JOINT REPLACEMENT Right    rt knee  . KNEE ARTHROSCOPY  10/2001   right  . KNEE FUSION  03/2009   right knee removal; antibiotic spacers;   . LEFT HEART CATH AND CORONARY ANGIOGRAPHY N/A 02/04/2018    WNL (Nicholas [name], Nicholas Bowen, MD)  . REPLACEMENT TOTAL KNEE  08/2008; 09/2001   right  . REVERSE SHOULDER ARTHROPLASTY Left 09/03/2017   Procedure: REVERSE SHOULDER ARTHROPLASTY;  Surgeon: Nicholas Pel, MD;  Location: [place];  Service:  Orthopedics;  Laterality: Left;  . REVERSE SHOULDER ARTHROPLASTY Right 05/13/2018   Procedure: RIGHT REVERSE SHOULDER ARTHROPLASTY;  Surgeon: Nicholas Pel, MD;  Location: [place];  Service: Orthopedics;  Laterality: Right;  . rod placement Right   . SYNOVECTOMY  06/2005   debridement, liner exchange right knee  . TOE AMPUTATION Left 08/2008   great toe - osteomyelitis (staph infection)  . TOE AMPUTATION Left 04/2016   2nd toe [name]  . TONSILLECTOMY    . TOTAL KNEE ARTHROPLASTY Left 01/15/2017  . TOTAL KNEE ARTHROPLASTY Left 01/15/2017   Procedure: TOTAL KNEE ARTHROPLASTY;  Surgeon: Nicholas Pel, MD;  Location[facility]  OR;  Service: Orthopedics;  Laterality: Left;   Social History   Occupational History  . Occupation: retired  Tobacco Use  . Smoking status: Never Smoker  . Smokeless tobacco: Never Used  Substance and Sexual Activity  . Alcohol use: No    Alcohol/week: 0.0 standard drinks    Comment: "I abused alcohol; last drink  ~ 2005"  . Drug use: No  . Sexual activity: Never

## 2018-05-31 ENCOUNTER — Other Ambulatory Visit: Payer: Self-pay | Admitting: Family Medicine

## 2018-05-31 MED ORDER — LEVOTHYROXINE SODIUM 100 MCG PO TABS: 100.0000 ug | ORAL_TABLET | Freq: Every day | ORAL | 1 refills | Status: DC

## 2018-06-10 NOTE — Discharge Summary (Signed)
Physician Discharge Summary  Patient ID: Nicholas [name] MRN: [id number] DOB/AGE: [birth year] 67 y.o.  Admit date: 05/13/2018 Discharge date: 05/16/2018  Admission Diagnoses:  Active Problems:   Primary osteoarthritis, right shoulder   Shoulder arthritis   Discharge Diagnoses:  Same  Surgeries: Procedure(s): RIGHT REVERSE SHOULDER ARTHROPLASTY on 05/13/2018   Consultants:   Discharged Condition: Stable  Hospital Course: Nicholas [name] is an 67 y.o. male who was admitted 05/13/2018 with a chief complaint of right shoulder pain, and found to have a diagnosis of right shoulder arthritis.  They were brought to the operating room on 05/13/2018 and underwent the above named procedures.  Patient tolerated the procedure well without immediate complications.  He was started with occupational therapy and physical therapy for shoulder range of motion.  Pain controlled during the admission.  He was transferred to home in good condition where he will continue to recuperate with home health physical therapy.  He will follow-up with me in approximately 10 days.  Antibiotics given:  Anti-infectives (From admission, onward)   Start     Dose/Rate Route Frequency Ordered Stop   05/14/18 0830  vancomycin (VANCOCIN) IVPB 1000 mg/200 mL premix     1,000 mg 200 mL/hr over 60 Minutes Intravenous  Once 05/14/18 0820 05/14/18 1850   05/13/18 2015  ceFAZolin (ANCEF) IVPB 2g/100 mL premix     2 g 200 mL/hr over 30 Minutes Intravenous Every 6 hours 05/13/18 2005 05/14/18 0234   05/13/18 1537  vancomycin (VANCOCIN) powder  Status:  Discontinued       As needed 05/13/18 1538 05/13/18 1620   05/13/18 1045  ceFAZolin (ANCEF) 3 g in dextrose 5 % 50 mL IVPB     3 g 100 mL/hr over 30 Minutes Intravenous To [facility] 05/12/18 1118 05/13/18 1530    .  Recent vital signs:  Vitals:   05/15/18 2008 05/16/18 0336  BP: 121/69 109/64  Pulse: 86 87  Resp: 18 18  Temp: 98.4 F (36.9 C) 98.1 F (36.7 C)  SpO2:  98% 92%    Recent laboratory studies:  Results for orders placed or performed during the hospital encounter of 05/13/18  Troponin I  Result Value Ref Range   Troponin I <0.03 <0.03 ng/mL  Troponin I  Result Value Ref Range   Troponin I <0.03 <0.03 ng/mL    Discharge Medications:   Allergies as of 05/16/2018      Reactions   Bee Venom Anaphylaxis, Other (See Comments)   Respiratory Distress   Peanut-containing Drug Products Anaphylaxis, Dermatitis   Celebrex [celecoxib] Other (See Comments)   BLACK STOOL?MELENA?   Percocet [oxycodone-acetaminophen] Hives, Swelling, Other (See Comments)   SWELLING REACTION UNSPECIFIED    Latex Rash   Nsaids Other (See Comments)   Disagrees with system      Medication List    STOP taking these medications   HYDROcodone-acetaminophen 10-325 MG tablet Commonly known as:  NORCO     TAKE these medications   acetaminophen 500 MG tablet Commonly known as:  TYLENOL Take 1,000 mg by mouth 2 (two) times daily. IN THE AFTERNOON & AT NIGHT   amitriptyline 50 MG tablet Commonly known as:  ELAVIL Take 2 tablets (100 mg total) by mouth at bedtime.   aspirin 81 MG tablet Take 1 tablet (81 mg total) by mouth daily.   furosemide 20 MG tablet Commonly known as:  LASIX TAKE 1 TABLET (20 MG TOTAL) BY MOUTH DAILY AS NEEDED FOR FLUID OR EDEMA. What changed:  when to take this   gabapentin 300 MG capsule Commonly known as:  NEURONTIN TAKE ONE CAPSULE BY MOUTH 4 TIMES A DAY What changed:  See the new instructions.   hydrocortisone 2.5 % cream Apply 1 application topically at bedtime as needed (for rash).   metoprolol succinate 100 MG 24 hr tablet Commonly known as:  TOPROL-XL Take 100 mg by mouth daily. Take with or immediately following a meal.   multivitamin with minerals Tabs tablet Take 1 tablet by mouth daily. Centrum Silver   nitroGLYCERIN 0.4 MG SL tablet Commonly known as:  NITROSTAT Place 0.4 mg under the tongue every 5 (five) minutes  as needed for chest pain.   pantoprazole 40 MG tablet Commonly known as:  PROTONIX TAKE 1 TABLET EVERY DAY What changed:    how much to take  how to take this  when to take this   PROBIOTIC PO Take 1 capsule daily by mouth.   ranitidine 150 MG tablet Commonly known as:  ZANTAC TAKE 1 TABLET (150 MG TOTAL) BY MOUTH AT BEDTIME.   SUMAtriptan 100 MG tablet Commonly known as:  IMITREX Take 1 tablet (100 mg total) by mouth daily as needed for migraine (repeat in 2 hours if needed). Limit to 2/24 hour period   triamcinolone cream 0.5 % Commonly known as:  KENALOG Apply 1 application topically 2 (two) times daily. What changed:    when to take this  reasons to take this       Diagnostic Studies: Dg Shoulder Right Port  Result Date: 05/13/2018 CLINICAL DATA:  Status surgery EXAM: PORTABLE RIGHT SHOULDER COMPARISON:  CT 03/21/2018 FINDINGS: Single AP view of the right shoulder. Right shoulder arthroplasty. No fracture. Gas within the soft tissues. IMPRESSION: Status post right shoulder arthroplasty with expected postoperative changes. Electronically Signed   By: Donavan Foil M.D.   On: 05/13/2018 17:07   Xr Shoulder Right  Result Date: 05/28/2018 AP outlet lateral right shoulder reviewed.  Reverse shoulder replacement in good position and alignment.  No complicating features.  Appears generally symmetric with the left shoulder.   Disposition:   Discharge Instructions    Call MD / Call 911   Complete by:  As directed    If you experience chest pain or shortness of breath, CALL 911 and be transported to the hospital emergency room.  If you develope a fever above 101 F, pus (white drainage) or increased drainage or redness at the wound, or calf pain, call your surgeon's office.   Call MD / Call 911   Complete by:  As directed    If you experience chest pain or shortness of breath, CALL 911 and be transported to the hospital emergency room.  If you develope a fever above 101  F, pus (white drainage) or increased drainage or redness at the wound, or calf pain, call your surgeon's office.   Constipation Prevention   Complete by:  As directed    Drink plenty of fluids.  Prune juice may be helpful.  You may use a stool softener, such as Colace (over the counter) 100 mg twice a day.  Use MiraLax (over the counter) for constipation as needed.   Constipation Prevention   Complete by:  As directed    Drink plenty of fluids.  Prune juice may be helpful.  You may use a stool softener, such as Colace (over the counter) 100 mg twice a day.  Use MiraLax (over the counter) for constipation as needed.   Diet -  low sodium heart healthy   Complete by:  As directed    Diet - low sodium heart healthy   Complete by:  As directed    Discharge instructions   Complete by:  As directed    Okay to shower.  Dressings waterproof No lifting with right arm CPM machine 1 hour 3 times a day Okay to stay in sling for the rest of the time.   Increase activity slowly as tolerated   Complete by:  As directed    Increase activity slowly as tolerated   Complete by:  As directed       Follow-up Information    [place] [place] Follow up.   Specialty:  [place] Why:  A representative from [facility] will contact you to arrange start date and time for your therapy.  Contact information: [address] [place] [address] [phone]            Signed: Anderson Malta 06/10/2018, 5:58 PM

## 2018-06-11 ENCOUNTER — Other Ambulatory Visit: Payer: Self-pay | Admitting: Family Medicine

## 2018-06-20 ENCOUNTER — Other Ambulatory Visit: Payer: Self-pay | Admitting: Family Medicine

## 2018-06-20 NOTE — Telephone Encounter (Signed)
Gabapentin Last filled:  03/25/18, #360 Last OV:  05/26/18 Next OV:  11/28/18, CPE

## 2018-06-28 ENCOUNTER — Other Ambulatory Visit: Payer: Self-pay | Admitting: Family Medicine

## 2018-07-09 ENCOUNTER — Ambulatory Visit (INDEPENDENT_AMBULATORY_CARE_PROVIDER_SITE_OTHER): Payer: PPO | Admitting: Orthopedic Surgery

## 2018-07-09 ENCOUNTER — Encounter (INDEPENDENT_AMBULATORY_CARE_PROVIDER_SITE_OTHER): Payer: Self-pay | Admitting: Orthopedic Surgery

## 2018-07-09 DIAGNOSIS — Z96611 Presence of right artificial shoulder joint: Secondary | ICD-10-CM

## 2018-07-09 NOTE — Progress Notes (Signed)
Post-Op Visit Note   Patient: Nicholas [name]           Date of Birth: 1951-09-11           MRN: [id number] Visit Date: 07/09/2018 PCP: [name], MD   Assessment & Plan:  Chief Complaint:  Chief Complaint  Patient presents with  . Right Shoulder - Follow-up   Visit Diagnoses:  1. Status post reverse total arthroplasty of right shoulder     Plan: [name] is a patient is now almost 3 months out right reverse shoulder replacement.  He is doing well.  Is not having any pain in the shoulders.  He does report some bilateral leg pain with prolonged ambulation.  MRI scan from 2018 did show moderate stenosis at T12-L1 as well as at L4-5.  Had some severe left foraminal stenosis in the lower levels as well.  For this he takes Neurontin about 4/day.  On exam he is got forward flexion and abduction both shoulders above 90 degrees.  Good strength is well.  Plan at this time is that he is doing well from his shoulder replacements.  I think he has symptomatic spinal stenosis but he in no way shape or form wants to have any more surgery anytime in the near future.  He has to sit after about 30 minutes of walking and that does help his symptoms.  He wants to hold off on any type of injections.  I did tell him that losing some weight would help with about 20 to 30% of his back pain.  I will see him back as needed  Follow-Up Instructions: Return if symptoms worsen or fail to improve.   Orders:  No orders of the defined types were placed in this encounter.  No orders of the defined types were placed in this encounter.   Imaging: No results found.  PMFS History: Patient Active Problem List   Diagnosis Date Noted  . Facial rash 05/26/2018  . Shoulder arthritis 05/13/2018  . Status post reverse total replacement of right shoulder 05/13/2018  . Pain and swelling of lower leg, right 03/20/2018  . Epigastric pain 02/26/2018  . Acute on chronic congestive heart failure ([place]) 12/27/2017  .  OSA (obstructive sleep apnea) 12/27/2017  . Health maintenance examination 11/25/2017  . Renal insufficiency 10/01/2017  . Fatty liver 10/01/2017  . Status post reverse total replacement of left shoulder 09/03/2017  . Bilateral shoulder pain 05/24/2017  . History of CVA in adulthood 03/08/2017  . S/P total knee arthroplasty, left 01/30/2017  . Arthritis of knee 01/15/2017  . Skin rash 12/10/2016  . Medicare annual wellness visit, initial 10/05/2016  . Advanced care planning/counseling discussion 10/05/2016  . Severe obesity (BMI 35.0-39.9) with comorbidity ([place]) 07/19/2016  . Hypothyroidism   . IDA (iron deficiency anemia) 04/23/2016  . Chronic pain syndrome 04/23/2016  . Inflammatory arthritis 04/23/2016  . Chronic leg pain 04/23/2016  . Lesion of palate 04/23/2016  . Grieving 04/23/2016  . GERD (gastroesophageal reflux disease)   . Duodenitis determined by biopsy 02/13/2016  . Diverticulosis of colon without hemorrhage 01/31/2016  . Chest pain 01/11/2012  . Migraine 01/11/2012  . History of methicillin resistant staphylococcus aureus (MRSA) 07/04/2010  . DEGENERATIVE DISC DISEASE 07/04/2010  . History of total right knee replacement 07/04/2010  . Primary osteoarthritis, right shoulder 06/09/2007  . Personal history of colonic adenoma 06/01/2003   Past Medical History:  Diagnosis Date  . Acute kidney failure 08/2008   "cleared up"  no problems since  . Allergy   . Anxiety   . Arthritis   . Asthma    ?of this no inhaler  . Blood transfusion   . CHF (congestive heart failure) ([place])    ?of this, pt denies  . CLOSTRIDIUM DIFFICILE COLITIS 07/04/2010   Annotation: 12/09, 2/10 Qualifier: Diagnosis of  By: [name], [name]    . Depression   . Depression with anxiety   . Duodenitis determined by biopsy 02/2016   peptic likely due to aleve (erosive gastropathy with duodenal erosions)  . ECZEMA 07/04/2010   Qualifier: Diagnosis of  By: [name], [name]    . Elevated liver  enzymes   . GERD (gastroesophageal reflux disease)   . Heart attack ([place])    08/2008 (likely demand ischemia in the setting of MSSA sepsis/R TKA  infection)10-2008  . History of hiatal hernia   . History of stomach ulcers   . Hypothyroidism   . Lower GI bleeding   . MALAR AND MAXILLARY BONES CLOSED FRACTURE 07/04/2010   Annotation: ORIF Qualifier: Diagnosis of  By: [name], [name]    . Migraine    "definitely"  . MRSA (methicillin resistant Staphylococcus aureus)    in leg, had to place steel rod in leg  . Personal history of colonic adenoma 06/01/2003  . PFO (patent foramen ovale)    small PFO by 08/2008 TEE  . Pneumonia 08/2008   "while in ICU"  . Seizures ([place]) 2009   "long time ago"    . Stroke [facility]) 2010   unable to complete sentences at times    Family History  Problem Relation Age of Onset  . Coronary artery disease Father 57  . Stroke Father   . CAD Mother 1       stents  . Breast cancer Sister   . Diabetes Brother   . Diabetes Sister   . Colon cancer Neg Hx   . Stomach cancer Neg Hx     Past Surgical History:  Procedure Laterality Date  . anterior  nerve transposition  07/2009   left ulnar nerve  . antibiotic spacer exchange  11/2008; 08/2006   right knee  . ARTHROTOMY  08/2008   right knee w/I&D  . CARDIAC CATHETERIZATION  02/04/2018  . CARDIOVASCULAR STRESS TEST  2013   stress EKG - negative for ischemia, 4 min 7.3 METs, normal blood pressure response  . CATARACT EXTRACTION    . COLONOSCOPY  11/2012   diverticulosis, rpt 10 yrs [name])  . ESOPHAGOGASTRODUODENOSCOPY  02/2016   erosive gastropathy with duodenal erosions [name])  . HARDWARE REMOVAL  04/2006   right knee w/antibiotic spacers placed  . INGUINAL HERNIA REPAIR  early 1990's   bilateral  . JOINT REPLACEMENT Right    rt knee  . KNEE ARTHROSCOPY  10/2001   right  . KNEE FUSION  03/2009   right knee removal; antibiotic spacers;   . LEFT HEART CATH AND CORONARY ANGIOGRAPHY N/A 02/04/2018     WNL (Patwardhan, Reynold Bowen, MD)  . REPLACEMENT TOTAL KNEE  08/2008; 09/2001   right  . REVERSE SHOULDER ARTHROPLASTY Left 09/03/2017   Procedure: REVERSE SHOULDER ARTHROPLASTY;  Surgeon: Meredith Pel, MD;  Location: [place];  Service: Orthopedics;  Laterality: Left;  . REVERSE SHOULDER ARTHROPLASTY Right 05/13/2018   Procedure: RIGHT REVERSE SHOULDER ARTHROPLASTY;  Surgeon: Meredith Pel, MD;  Location: [place];  Service: Orthopedics;  Laterality: Right;  . rod placement Right   . SYNOVECTOMY  06/2005   debridement, liner exchange right knee  . TOE AMPUTATION Left 08/2008   great toe - osteomyelitis (staph infection)  . TOE AMPUTATION Left 04/2016   2nd toe [name]  . TONSILLECTOMY    . TOTAL KNEE ARTHROPLASTY Left 01/15/2017  . TOTAL KNEE ARTHROPLASTY Left 01/15/2017   Procedure: TOTAL KNEE ARTHROPLASTY;  Surgeon: Meredith Pel, MD;  Location: [place];  Service: Orthopedics;  Laterality: Left;   Social History   Occupational History  . Occupation: retired  Tobacco Use  . Smoking status: Never Smoker  . Smokeless tobacco: Never Used  Substance and Sexual Activity  . Alcohol use: No    Alcohol/week: 0.0 standard drinks    Comment: "I abused alcohol; last drink  ~ 2005"  . Drug use: No  . Sexual activity: Never

## 2018-07-21 DIAGNOSIS — D2271 Melanocytic nevi of right lower limb, including hip: Secondary | ICD-10-CM | POA: Diagnosis not present

## 2018-07-21 DIAGNOSIS — D2261 Melanocytic nevi of right upper limb, including shoulder: Secondary | ICD-10-CM | POA: Diagnosis not present

## 2018-07-21 DIAGNOSIS — D225 Melanocytic nevi of trunk: Secondary | ICD-10-CM | POA: Diagnosis not present

## 2018-07-21 DIAGNOSIS — S1080XA Unspecified superficial injury of other specified part of neck, initial encounter: Secondary | ICD-10-CM | POA: Diagnosis not present

## 2018-07-21 DIAGNOSIS — L218 Other seborrheic dermatitis: Secondary | ICD-10-CM | POA: Diagnosis not present

## 2018-07-21 DIAGNOSIS — D2272 Melanocytic nevi of left lower limb, including hip: Secondary | ICD-10-CM | POA: Diagnosis not present

## 2018-07-21 DIAGNOSIS — S20409A Unspecified superficial injuries of unspecified back wall of thorax, initial encounter: Secondary | ICD-10-CM | POA: Diagnosis not present

## 2018-07-21 DIAGNOSIS — D2262 Melanocytic nevi of left upper limb, including shoulder: Secondary | ICD-10-CM | POA: Diagnosis not present

## 2018-07-31 ENCOUNTER — Telehealth (INDEPENDENT_AMBULATORY_CARE_PROVIDER_SITE_OTHER): Payer: Self-pay | Admitting: Orthopedic Surgery

## 2018-07-31 NOTE — Telephone Encounter (Signed)
Ok for robaxin and norco 30 each 1 po q 12 thx

## 2018-07-31 NOTE — Telephone Encounter (Signed)
Patient came in stating needs some meds for his back pain.  Has seen [name] Sa in the past and has made appt for 08/13/18 to see him for lower back pain.

## 2018-07-31 NOTE — Telephone Encounter (Signed)
Please advise. Thanks.  

## 2018-08-01 MED ORDER — HYDROCODONE-ACETAMINOPHEN 5-325 MG PO TABS: ORAL_TABLET | ORAL | 0 refills | Status: DC

## 2018-08-01 MED ORDER — METHOCARBAMOL 500 MG PO TABS: ORAL_TABLET | ORAL | 0 refills | Status: DC

## 2018-08-01 NOTE — Telephone Encounter (Signed)
IC advised could pick up at front desk to take to pharmacy.  

## 2018-08-03 ENCOUNTER — Other Ambulatory Visit: Payer: Self-pay | Admitting: Family Medicine

## 2018-08-03 DIAGNOSIS — E039 Hypothyroidism, unspecified: Secondary | ICD-10-CM

## 2018-08-04 ENCOUNTER — Other Ambulatory Visit: Payer: PPO

## 2018-08-13 ENCOUNTER — Ambulatory Visit (INDEPENDENT_AMBULATORY_CARE_PROVIDER_SITE_OTHER): Payer: PPO | Admitting: Orthopedic Surgery

## 2018-08-13 ENCOUNTER — Encounter (INDEPENDENT_AMBULATORY_CARE_PROVIDER_SITE_OTHER): Payer: Self-pay | Admitting: Orthopedic Surgery

## 2018-08-13 ENCOUNTER — Telehealth: Payer: Self-pay

## 2018-08-13 ENCOUNTER — Other Ambulatory Visit (INDEPENDENT_AMBULATORY_CARE_PROVIDER_SITE_OTHER): Payer: PPO

## 2018-08-13 DIAGNOSIS — M5441 Lumbago with sciatica, right side: Secondary | ICD-10-CM | POA: Diagnosis not present

## 2018-08-13 DIAGNOSIS — M5442 Lumbago with sciatica, left side: Secondary | ICD-10-CM | POA: Diagnosis not present

## 2018-08-13 DIAGNOSIS — E039 Hypothyroidism, unspecified: Secondary | ICD-10-CM | POA: Diagnosis not present

## 2018-08-13 DIAGNOSIS — G8929 Other chronic pain: Secondary | ICD-10-CM | POA: Diagnosis not present

## 2018-08-13 LAB — T4, FREE: FREE T4: 0.68 ng/dL (ref 0.60–1.60)

## 2018-08-13 LAB — TSH: TSH: 5.05 u[IU]/mL — AB (ref 0.35–4.50)

## 2018-08-13 NOTE — Progress Notes (Signed)
Office Visit Note   Patient: Nicholas [name]           Date of Birth: [birth year]           MRN: [id number] Visit Date: 08/13/2018 Requested by: [name], MD [place], [place] [address] PCP: [name], MD  Subjective: Chief Complaint  Patient presents with  . Lower Back - Pain    HPI: [name] is a patient with back pain.  He reports increasing back pain and left leg pain.  Is radiating down the leg and up the thigh.  He is doing well with his left total knee replacement.  MRI scan from 2018 shows severe left-sided foraminal impingement at L5-S1 as well as moderate canal stenosis at L4-5.  He is taking Norco and muscle relaxer which helps him to tolerate the pain.  He is ready for surgical consultation.              ROS: All systems reviewed are negative as they relate to the chief complaint within the history of present illness.  Patient denies  fevers or chills.   Assessment & Plan: Visit Diagnoses:  1. Chronic low back pain with bilateral sciatica, unspecified back pain laterality   2. Chronic bilateral low back pain with left-sided sciatica     Plan: Impression is foraminal stenosis and moderate canal stenosis in a patient who has significant back pain.  I told him that I thought surgery would have a good chance that alleviating most of his leg pain but only part of his back pain.  He is had injections in the past and wishes to proceed with surgical intervention.  I will have him repeat the MRI scan and then see Dr. Inda Merlin in follow-up.  Follow-Up Instructions: No follow-ups on file.   Orders:  Orders Placed This Encounter  Procedures  . MR Lumbar Spine w/o contrast   No orders of the defined types were placed in this encounter.     Procedures: No procedures performed   Clinical Data: No additional findings.  Objective: Vital Signs: There were no vitals taken for this visit.  Physical Exam:   Constitutional: Patient appears  well-developed HEENT:  Head: Normocephalic Eyes:EOM are normal Neck: Normal range of motion Cardiovascular: Normal rate Pulmonary/chest: Effort normal Neurologic: Patient is alert Skin: Skin is warm Psychiatric: Patient has normal mood and affect    Ortho Exam: Ortho exam demonstrates no nerve retention signs on the left with good ankle dorsi flexion plantarflexion strength.  Right knee is fused.  He has no definite paresthesias L1 S1 bilaterally.  No groin pain with internal/external rotation of that left leg.  Specialty Comments:  No specialty comments available.  Imaging: No results found.   PMFS History: Patient Active Problem List   Diagnosis Date Noted  . Facial rash 05/26/2018  . Shoulder arthritis 05/13/2018  . Status post reverse total replacement of right shoulder 05/13/2018  . Pain and swelling of lower leg, right 03/20/2018  . Epigastric pain 02/26/2018  . Acute on chronic congestive heart failure ([place]) 12/27/2017  . OSA (obstructive sleep apnea) 12/27/2017  . Health maintenance examination 11/25/2017  . Renal insufficiency 10/01/2017  . Fatty liver 10/01/2017  . Status post reverse total replacement of left shoulder 09/03/2017  . Bilateral shoulder pain 05/24/2017  . History of CVA in adulthood 03/08/2017  . S/P total knee arthroplasty, left 01/30/2017  . Arthritis of knee 01/15/2017  . Skin rash 12/10/2016  . Medicare annual  wellness visit, initial 10/05/2016  . Advanced care planning/counseling discussion 10/05/2016  . Severe obesity (BMI 35.0-39.9) with comorbidity ([place]) 07/19/2016  . Hypothyroidism   . IDA (iron deficiency anemia) 04/23/2016  . Chronic pain syndrome 04/23/2016  . Inflammatory arthritis 04/23/2016  . Chronic leg pain 04/23/2016  . Lesion of palate 04/23/2016  . Grieving 04/23/2016  . GERD (gastroesophageal reflux disease)   . Duodenitis determined by biopsy 02/13/2016  . Diverticulosis of colon without hemorrhage 01/31/2016  .  Chest pain 01/11/2012  . Migraine 01/11/2012  . History of methicillin resistant staphylococcus aureus (MRSA) 07/04/2010  . DEGENERATIVE DISC DISEASE 07/04/2010  . History of total right knee replacement 07/04/2010  . Primary osteoarthritis, right shoulder 06/09/2007  . Personal history of colonic adenoma 06/01/2003   Past Medical History:  Diagnosis Date  . Acute kidney failure 08/2008   "cleared up"  no problems since  . Allergy   . Anxiety   . Arthritis   . Asthma    ?of this no inhaler  . Blood transfusion   . CHF (congestive heart failure) ([place])    ?of this, pt denies  . CLOSTRIDIUM DIFFICILE COLITIS 07/04/2010   Annotation: 12/09, 2/10 Qualifier: Diagnosis of  By: [name], [name]    . Depression   . Depression with anxiety   . Duodenitis determined by biopsy 02/2016   peptic likely due to aleve (erosive gastropathy with duodenal erosions)  . ECZEMA 07/04/2010   Qualifier: Diagnosis of  By: [name], [name]    . Elevated liver enzymes   . GERD (gastroesophageal reflux disease)   . Heart attack ([place])    08/2008 (likely demand ischemia in the setting of MSSA sepsis/R TKA  infection)10-2008  . History of hiatal hernia   . History of stomach ulcers   . Hypothyroidism   . Lower GI bleeding   . MALAR AND MAXILLARY BONES CLOSED FRACTURE 07/04/2010   Annotation: ORIF Qualifier: Diagnosis of  By: [name], [name]    . Migraine    "definitely"  . MRSA (methicillin resistant Staphylococcus aureus)    in leg, had to place steel rod in leg  . Personal history of colonic adenoma 06/01/2003  . PFO (patent foramen ovale)    small PFO by 08/2008 TEE  . Pneumonia 08/2008   "while in ICU"  . Seizures ([place]) 2009   "long time ago"    . Stroke [facility]) 2010   unable to complete sentences at times    Family History  Problem Relation Age of Onset  . Coronary artery disease Father 9  . Stroke Father   . CAD Mother 58       stents  . Breast cancer Sister   . Diabetes Brother   .  Diabetes Sister   . Colon cancer Neg Hx   . Stomach cancer Neg Hx     Past Surgical History:  Procedure Laterality Date  . anterior  nerve transposition  07/2009   left ulnar nerve  . antibiotic spacer exchange  11/2008; 08/2006   right knee  . ARTHROTOMY  08/2008   right knee w/I&D  . CARDIAC CATHETERIZATION  02/04/2018  . CARDIOVASCULAR STRESS TEST  2013   stress EKG - negative for ischemia, 4 min 7.3 METs, normal blood pressure response  . CATARACT EXTRACTION    . COLONOSCOPY  11/2012   diverticulosis, rpt 10 yrs [name])  . ESOPHAGOGASTRODUODENOSCOPY  02/2016   erosive gastropathy with duodenal erosions [name])  . HARDWARE REMOVAL  04/2006  right knee w/antibiotic spacers placed  . INGUINAL HERNIA REPAIR  early 1990's   bilateral  . JOINT REPLACEMENT Right    rt knee  . KNEE ARTHROSCOPY  10/2001   right  . KNEE FUSION  03/2009   right knee removal; antibiotic spacers;   . LEFT HEART CATH AND CORONARY ANGIOGRAPHY N/A 02/04/2018    WNL (Patwardhan, Reynold Bowen, MD)  . REPLACEMENT TOTAL KNEE  08/2008; 09/2001   right  . REVERSE SHOULDER ARTHROPLASTY Left 09/03/2017   Procedure: REVERSE SHOULDER ARTHROPLASTY;  Surgeon: Meredith Pel, MD;  Location: [name];  Service: Orthopedics;  Laterality: Left;  . REVERSE SHOULDER ARTHROPLASTY Right 05/13/2018   Procedure: RIGHT REVERSE SHOULDER ARTHROPLASTY;  Surgeon: Meredith Pel, MD;  Location: [place];  Service: Orthopedics;  Laterality: Right;  . rod placement Right   . SYNOVECTOMY  06/2005   debridement, liner exchange right knee  . TOE AMPUTATION Left 08/2008   great toe - osteomyelitis (staph infection)  . TOE AMPUTATION Left 04/2016   2nd toe [name]  . TONSILLECTOMY    . TOTAL KNEE ARTHROPLASTY Left 01/15/2017  . TOTAL KNEE ARTHROPLASTY Left 01/15/2017   Procedure: TOTAL KNEE ARTHROPLASTY;  Surgeon: Meredith Pel, MD;  Location: [facility];  Service: Orthopedics;  Laterality: Left;   Social History   Occupational  History  . Occupation: retired  Tobacco Use  . Smoking status: Never Smoker  . Smokeless tobacco: Never Used  Substance and Sexual Activity  . Alcohol use: No    Alcohol/week: 0.0 standard drinks    Comment: "I abused alcohol; last drink  ~ 2005"  . Drug use: No  . Sexual activity: Never

## 2018-08-13 NOTE — Telephone Encounter (Signed)
Pt was in office for lab visit today. Wants to make Dr. Darnell Level aware his doctor at [facility] informed pt he has bone spurs in lower back causing pain down left leg. Says they are leaving it up to pt if he wants surgery or not.

## 2018-08-13 NOTE — Telephone Encounter (Signed)
Noted. Thanks.

## 2018-08-16 ENCOUNTER — Other Ambulatory Visit: Payer: Self-pay | Admitting: Family Medicine

## 2018-08-16 MED ORDER — LEVOTHYROXINE SODIUM 112 MCG PO TABS: 112.0000 ug | ORAL_TABLET | Freq: Every day | ORAL | 1 refills | Status: DC

## 2018-08-17 ENCOUNTER — Other Ambulatory Visit: Payer: Self-pay | Admitting: Family Medicine

## 2018-08-18 NOTE — Telephone Encounter (Signed)
Electronic refill request Lasix Last refill 04/22/18 #30/3 Last office visit 07/09/18 See allergy/contraindication

## 2018-08-26 ENCOUNTER — Ambulatory Visit (INDEPENDENT_AMBULATORY_CARE_PROVIDER_SITE_OTHER): Payer: PPO | Admitting: Orthopaedic Surgery

## 2018-09-01 ENCOUNTER — Other Ambulatory Visit: Payer: Self-pay

## 2018-09-02 ENCOUNTER — Ambulatory Visit
Admission: RE | Admit: 2018-09-02 | Discharge: 2018-09-02 | Disposition: A | Discharge status: Home / Self Care | Payer: PPO | Source: Ambulatory Visit | Attending: Orthopedic Surgery | Admitting: Orthopedic Surgery

## 2018-09-02 DIAGNOSIS — M48061 Spinal stenosis, lumbar region without neurogenic claudication: Secondary | ICD-10-CM | POA: Diagnosis not present

## 2018-09-02 DIAGNOSIS — M5442 Lumbago with sciatica, left side: Principal | ICD-10-CM

## 2018-09-02 DIAGNOSIS — G8929 Other chronic pain: Secondary | ICD-10-CM

## 2018-09-02 DIAGNOSIS — M5441 Lumbago with sciatica, right side: Principal | ICD-10-CM

## 2018-09-03 ENCOUNTER — Encounter (INDEPENDENT_AMBULATORY_CARE_PROVIDER_SITE_OTHER): Payer: Self-pay | Admitting: Orthopaedic Surgery

## 2018-09-03 ENCOUNTER — Ambulatory Visit (INDEPENDENT_AMBULATORY_CARE_PROVIDER_SITE_OTHER): Payer: PPO | Admitting: Orthopaedic Surgery

## 2018-09-03 VITALS — BP 144/89 | HR 75 | Ht 69.0 in | Wt 280.0 lb

## 2018-09-03 DIAGNOSIS — M48061 Spinal stenosis, lumbar region without neurogenic claudication: Secondary | ICD-10-CM | POA: Diagnosis not present

## 2018-09-03 NOTE — Progress Notes (Signed)
Office Visit Note   Patient: Nicholas [name]           Date of Birth: [birth year]           MRN: [id number] Visit Date: 09/03/2018              Requested by: [name], MD [place], [place] [id number] PCP: [name], MD   Assessment & Plan: Visit Diagnoses:  1. Spinal stenosis of lumbar region without neurogenic claudication     Plan: We discussed possible options for surgery.  He has 2 levels that are mild to moderate has 2 levels L3-4 and L4-5 that are moderate to severe.  I will check him back in a month and will obtain a lumbar lateral maximal flexion extension lumbar lateral x-rays on return visit.  I discussed with him that if the surgeries performed as best that he wean off the Norco medication is been on for some period of time so that postoperative pain medication works better.  He has mild retrolisthesis at L1-L2 3.  3 4 and L4-5 have moderate to severe narrowing which has progressed to some degree in the last year.  Lumbar flexion-extension lateral x-rays on return in 1 month.  Follow-Up Instructions: Return in about 1 month (around 10/03/2018).   Orders:  No orders of the defined types were placed in this encounter.  No orders of the defined types were placed in this encounter.     Procedures: No procedures performed   Clinical Data: No additional findings.   Subjective: Chief Complaint  Patient presents with  . Lower Back - Pain    HPI 67 year old male referred by Dr. Alphonzo Severance for evaluation of low back pain.  He has had MRI scan lumbar 2018 and also recently repeated this month.  His foraminal stenosis and moderate central stenosis at 2 levels and mild to moderate changes at 2 other levels.  Taking Norco and muscle relaxants for this.  Previous left reverse shoulder November 2018 also previous total knee arthroplasty on the left with postoperative infection and eventual solid knee effusion with dyspnea near extension and  locking intramedullary rod.  He has had problems with obesity and chronic pain.  He has had some problems with urgency as well as urinary frequency without dysuria.  Some difficulty getting from sitting standing and walking with his shoulder arthroplasty as well as is fused right knee.  He has more pain radiating down his right and left knee.  Denies numbness or tingling in his feet.  Positive for history of MRSA postop  Right total knee arthroplasty.  Left total knee arthroplasty doing well.  Days he can walk several blocks and denies increased pain with standing.  Review of Systems positive for right knee effusion after total knee arthroplasty with MRSA infection.  Knee is solidly fused well-healed incision.  Left total knee arthroplasty left reverse total shoulder arthroplasty.  Chronic low back pain with changes of mild to moderate stenosis at multiple levels with some retrolisthesis.  Positive for obesity, hypothyroidism otherwise negative as pertains HPI.  Objective: Vital Signs: BP (!) 144/89   Pulse 75   Ht 5\' 9"  (1.753 m)   Wt 280 lb (127 kg)   BMI 41.35 kg/m   Physical Exam  Constitutional: He is oriented to person, place, and time. He appears well-developed and well-nourished.  HENT:  Head: Normocephalic and atraumatic.  Eyes: Pupils are equal, round, and reactive to light. EOM are normal.  Neck: No tracheal deviation present. No thyromegaly present.  Cardiovascular: Normal rate.  Pulmonary/Chest: Effort normal. He has no wheezes.  Abdominal: Soft. Bowel sounds are normal.  Neurological: He is alert and oriented to person, place, and time.  Skin: Skin is warm and dry. Capillary refill takes less than 2 seconds.  Psychiatric: He has a normal mood and affect. His behavior is normal. Judgment and thought content normal.    Ortho Exam fused right total knee arthroplasty.  Negative straight leg raising on the right negative on the left.  Some sciatic notch tenderness.  Anterior tib  gastrocsoleus is active without deficit.  Specialty Comments:  No specialty comments available.  Imaging:CLINICAL DATA:  Chronic low back pain with bilateral sciatica  EXAM: MRI LUMBAR SPINE WITHOUT CONTRAST  TECHNIQUE: Multiplanar, multisequence MR imaging of the lumbar spine was performed. No intravenous contrast was administered.  COMPARISON:  Lumbar MRI 06/27/2017  FINDINGS: Segmentation: L5 is a transitional vertebra. Level assignment is consistent with prior MRI report.  Alignment: Mild retrolisthesis L1-2, L2-3, L3-4. Mild levo scoliosis.  Vertebrae:  Negative for fracture or mass  Conus medullaris and cauda equina: Conus extends to the L1-2 level. Conus and cauda equina appear normal.  Paraspinal and other soft tissues: Small right renal cyst. No paraspinous mass.  Disc levels:  T10-11: Moderate disc and facet degeneration with spurring. Mild spinal stenosis  T11-12: Moderate disc and facet degeneration. Mild to moderate spinal stenosis  T12-L1: Negative  L1-2: Mild retrolisthesis. Disc and facet degeneration moderate spinal stenosis has progressed in the interval.  L2-3: Mild retrolisthesis. Moderate disc and facet degeneration. Moderate spinal stenosis with progression from the prior study. Moderate subarticular stenosis bilaterally.  L3-4: Disc degeneration with disc bulging and spurring. Moderate facet hypertrophy bilaterally. Moderate to severe spinal stenosis with progression. Moderate subarticular stenosis bilaterally.  L4-5: Disc degeneration. Diffuse endplate spurring. Severe facet degeneration with bony and ligament hypertrophy. Moderate to severe spinal stenosis with progression. Moderate subarticular stenosis bilaterally  L5-S1: Advanced disc degeneration on the left. Left-sided endplate spurring and facet hypertrophy with marked left-sided foraminal stenosis unchanged prior study.  IMPRESSION: Multilevel degenerative  change throughout the lumbar spine with progression since the prior study. Progressive spinal stenosis at L1-2, L2-3, L3-4, and L4-5.  Severe left foraminal encroachment due to spurring L5-S1 unchanged.   Electronically Signed   By: Franchot Gallo M.D.   On: 09/02/2018 09:19    PMFS History: Patient Active Problem List   Diagnosis Date Noted  . Facial rash 05/26/2018  . Shoulder arthritis 05/13/2018  . Status post reverse total replacement of right shoulder 05/13/2018  . Pain and swelling of lower leg, right 03/20/2018  . Epigastric pain 02/26/2018  . Acute on chronic congestive heart failure ([place]) 12/27/2017  . OSA (obstructive sleep apnea) 12/27/2017  . Health maintenance examination 11/25/2017  . Renal insufficiency 10/01/2017  . Fatty liver 10/01/2017  . Status post reverse total replacement of left shoulder 09/03/2017  . Bilateral shoulder pain 05/24/2017  . History of CVA in adulthood 03/08/2017  . S/P total knee arthroplasty, left 01/30/2017  . Arthritis of knee 01/15/2017  . Skin rash 12/10/2016  . Medicare annual wellness visit, initial 10/05/2016  . Advanced care planning/counseling discussion 10/05/2016  . Severe obesity (BMI 35.0-39.9) with comorbidity ([place]) 07/19/2016  . Hypothyroidism   . IDA (iron deficiency anemia) 04/23/2016  . Chronic pain syndrome 04/23/2016  . Inflammatory arthritis 04/23/2016  . Chronic leg pain 04/23/2016  . Lesion of palate 04/23/2016  .  Grieving 04/23/2016  . GERD (gastroesophageal reflux disease)   . Duodenitis determined by biopsy 02/13/2016  . Diverticulosis of colon without hemorrhage 01/31/2016  . Chest pain 01/11/2012  . Migraine 01/11/2012  . History of methicillin resistant staphylococcus aureus (MRSA) 07/04/2010  . DEGENERATIVE DISC DISEASE 07/04/2010  . History of total right knee replacement 07/04/2010  . Primary osteoarthritis, right shoulder 06/09/2007  . Personal history of colonic adenoma 06/01/2003   Past  Medical History:  Diagnosis Date  . Acute kidney failure 08/2008   "cleared up"  no problems since  . Allergy   . Anxiety   . Arthritis   . Asthma    ?of this no inhaler  . Blood transfusion   . CHF (congestive heart failure) ([place])    ?of this, pt denies  . CLOSTRIDIUM DIFFICILE COLITIS 07/04/2010   Annotation: 12/09, 2/10 Qualifier: Diagnosis of  By: [name], [name]    . Depression   . Depression with anxiety   . Duodenitis determined by biopsy 02/2016   peptic likely due to aleve (erosive gastropathy with duodenal erosions)  . ECZEMA 07/04/2010   Qualifier: Diagnosis of  By: [name], [name]    . Elevated liver enzymes   . GERD (gastroesophageal reflux disease)   . Heart attack ([place])    08/2008 (likely demand ischemia in the setting of MSSA sepsis/R TKA  infection)10-2008  . History of hiatal hernia   . History of stomach ulcers   . Hypothyroidism   . Lower GI bleeding   . MALAR AND MAXILLARY BONES CLOSED FRACTURE 07/04/2010   Annotation: ORIF Qualifier: Diagnosis of  By: [name], [name]    . Migraine    "definitely"  . MRSA (methicillin resistant Staphylococcus aureus)    in leg, had to place steel rod in leg  . Personal history of colonic adenoma 06/01/2003  . PFO (patent foramen ovale)    small PFO by 08/2008 TEE  . Pneumonia 08/2008   "while in ICU"  . Seizures ([place]) 2009   "long time ago"    . Stroke [facility]) 2010   unable to complete sentences at times    Family History  Problem Relation Age of Onset  . Coronary artery disease Father 44  . Stroke Father   . CAD Mother 61       stents  . Breast cancer Sister   . Diabetes Brother   . Diabetes Sister   . Colon cancer Neg Hx   . Stomach cancer Neg Hx     Past Surgical History:  Procedure Laterality Date  . anterior  nerve transposition  07/2009   left ulnar nerve  . antibiotic spacer exchange  11/2008; 08/2006   right knee  . ARTHROTOMY  08/2008   right knee w/I&D  . CARDIAC CATHETERIZATION  02/04/2018    . CARDIOVASCULAR STRESS TEST  2013   stress EKG - negative for ischemia, 4 min 7.3 METs, normal blood pressure response  . CATARACT EXTRACTION    . COLONOSCOPY  11/2012   diverticulosis, rpt 10 yrs [name])  . ESOPHAGOGASTRODUODENOSCOPY  02/2016   erosive gastropathy with duodenal erosions [name])  . HARDWARE REMOVAL  04/2006   right knee w/antibiotic spacers placed  . INGUINAL HERNIA REPAIR  early 1990's   bilateral  . JOINT REPLACEMENT Right    rt knee  . KNEE ARTHROSCOPY  10/2001   right  . KNEE FUSION  03/2009   right knee removal; antibiotic spacers;   . LEFT HEART CATH  AND CORONARY ANGIOGRAPHY N/A 02/04/2018    WNL (Patwardhan, Reynold Bowen, MD)  . REPLACEMENT TOTAL KNEE  08/2008; 09/2001   right  . REVERSE SHOULDER ARTHROPLASTY Left 09/03/2017   Procedure: REVERSE SHOULDER ARTHROPLASTY;  Surgeon: Meredith Pel, MD;  Location: [place];  Service: Orthopedics;  Laterality: Left;  . REVERSE SHOULDER ARTHROPLASTY Right 05/13/2018   Procedure: RIGHT REVERSE SHOULDER ARTHROPLASTY;  Surgeon: Meredith Pel, MD;  Location: [place];  Service: Orthopedics;  Laterality: Right;  . rod placement Right   . SYNOVECTOMY  06/2005   debridement, liner exchange right knee  . TOE AMPUTATION Left 08/2008   great toe - osteomyelitis (staph infection)  . TOE AMPUTATION Left 04/2016   2nd toe [name]  . TONSILLECTOMY    . TOTAL KNEE ARTHROPLASTY Left 01/15/2017  . TOTAL KNEE ARTHROPLASTY Left 01/15/2017   Procedure: TOTAL KNEE ARTHROPLASTY;  Surgeon: Meredith Pel, MD;  Location: [place];  Service: Orthopedics;  Laterality: Left;   Social History   Occupational History  . Occupation: retired  Tobacco Use  . Smoking status: Never Smoker  . Smokeless tobacco: Never Used  Substance and Sexual Activity  . Alcohol use: No    Alcohol/week: 0.0 standard drinks    Comment: "I abused alcohol; last drink  ~ 2005"  . Drug use: No  . Sexual activity: Never

## 2018-09-04 ENCOUNTER — Encounter (INDEPENDENT_AMBULATORY_CARE_PROVIDER_SITE_OTHER): Payer: Self-pay | Admitting: Orthopaedic Surgery

## 2018-10-01 ENCOUNTER — Encounter (INDEPENDENT_AMBULATORY_CARE_PROVIDER_SITE_OTHER): Payer: Self-pay | Admitting: Orthopaedic Surgery

## 2018-10-01 ENCOUNTER — Ambulatory Visit (INDEPENDENT_AMBULATORY_CARE_PROVIDER_SITE_OTHER): Payer: Self-pay

## 2018-10-01 ENCOUNTER — Ambulatory Visit (INDEPENDENT_AMBULATORY_CARE_PROVIDER_SITE_OTHER): Payer: PPO | Admitting: Orthopaedic Surgery

## 2018-10-01 DIAGNOSIS — M48061 Spinal stenosis, lumbar region without neurogenic claudication: Secondary | ICD-10-CM | POA: Insufficient documentation

## 2018-10-01 NOTE — Progress Notes (Signed)
Office Visit Note   Patient: Nicholas [name]           Date of Birth: 1951/05/17           MRN: [id number] Visit Date: 10/01/2018              Requested by: [name], MD [place], [place] [id number] PCP: [name], MD   Assessment & Plan: Visit Diagnoses:  1. Spinal stenosis of lumbar region without neurogenic claudication     Plan: MRI scan shows progressive spinal stenosis most severe at L3-4 and L4-5.  He has stable left foraminal stenosis L5-S1.  We discussed options with the patient and he is having neurogenic claudication symptoms without radiculopathy.  Plan to be decompression L3-4 L4-5 2 level decompression centrally.  He need to have medical clearance due to his past medical history.  He is tolerated shoulder arthroplasties as well as total knee arthroplasty and knee fusion in the past.  Procedure discussed questions were elicited and answered he understands and requests we proceed.  Follow-Up Instructions: No follow-ups on file.   Orders:  Orders Placed This Encounter  Procedures  . XR Lumbar Spine 2-3 Views   No orders of the defined types were placed in this encounter.     Procedures: No procedures performed   Clinical Data: No additional findings.   Subjective: Chief Complaint  Patient presents with  . Lower Back - Follow-up, Pain    HPI 67 year old male returns with ongoing problems with back pain claudication symptoms with standing and walking.  He gets relief leaning over a grocery cart.  He states he can stand for 5 minutes  Review of Systems 14 point review of system positive for previous total knee arthroplasty MRSA infection on the right with long rod and fused stable knee in near extension.  Lumbar spinal stenosis with neurogenic claudication symptoms.  Obesity BMI 40, hypothyroidism.  Previous left reverse total shoulder arthroplasty Dr. [name] Sa 2018 November.  Right reverse shoulder arthroplasty July 2019 Dr. [name] Sa.   Positive for GERD history of diverticulitis.  History of congestive heart failure, fatty liver.   Objective: Vital Signs: There were no vitals taken for this visit.  Physical Exam Constitutional:      Appearance: He is well-developed.  HENT:     Head: Normocephalic and atraumatic.  Eyes:     Pupils: Pupils are equal, round, and reactive to light.  Neck:     Thyroid: No thyromegaly.     Trachea: No tracheal deviation.  Cardiovascular:     Rate and Rhythm: Normal rate.  Pulmonary:     Effort: Pulmonary effort is normal.     Breath sounds: No wheezing.  Abdominal:     General: Bowel sounds are normal.     Palpations: Abdomen is soft.  Skin:    General: Skin is warm and dry.     Capillary Refill: Capillary refill takes less than 2 seconds.  Neurological:     Mental Status: He is alert and oriented to person, place, and time.  Psychiatric:        Behavior: Behavior normal.        Thought Content: Thought content normal.        Judgment: Judgment normal.     Ortho Exam fused right knee well-healed incision.  Negative straight leg raising 90 degrees.  Ambulates without use of a cane.  Knee jerk is intact on the left ankle jerks intact right and left.  Distal pulses palpable.  Specialty Comments:  No specialty comments available.  Imaging:CLINICAL DATA:  Chronic low back pain with bilateral sciatica  EXAM: MRI LUMBAR SPINE WITHOUT CONTRAST  TECHNIQUE: Multiplanar, multisequence MR imaging of the lumbar spine was performed. No intravenous contrast was administered.  COMPARISON:  Lumbar MRI 06/27/2017  FINDINGS: Segmentation: L5 is a transitional vertebra. Level assignment is consistent with prior MRI report.  Alignment: Mild retrolisthesis L1-2, L2-3, L3-4. Mild levo scoliosis.  Vertebrae:  Negative for fracture or mass  Conus medullaris and cauda equina: Conus extends to the L1-2 level. Conus and cauda equina appear normal.  Paraspinal and other soft  tissues: Small right renal cyst. No paraspinous mass.  Disc levels:  T10-11: Moderate disc and facet degeneration with spurring. Mild spinal stenosis  T11-12: Moderate disc and facet degeneration. Mild to moderate spinal stenosis  T12-L1: Negative  L1-2: Mild retrolisthesis. Disc and facet degeneration moderate spinal stenosis has progressed in the interval.  L2-3: Mild retrolisthesis. Moderate disc and facet degeneration. Moderate spinal stenosis with progression from the prior study. Moderate subarticular stenosis bilaterally.  L3-4: Disc degeneration with disc bulging and spurring. Moderate facet hypertrophy bilaterally. Moderate to severe spinal stenosis with progression. Moderate subarticular stenosis bilaterally.  L4-5: Disc degeneration. Diffuse endplate spurring. Severe facet degeneration with bony and ligament hypertrophy. Moderate to severe spinal stenosis with progression. Moderate subarticular stenosis bilaterally  L5-S1: Advanced disc degeneration on the left. Left-sided endplate spurring and facet hypertrophy with marked left-sided foraminal stenosis unchanged prior study.  IMPRESSION: Multilevel degenerative change throughout the lumbar spine with progression since the prior study. Progressive spinal stenosis at L1-2, L2-3, L3-4, and L4-5.  Severe left foraminal encroachment due to spurring L5-S1 unchanged.   Electronically Signed   By: Franchot Gallo M.D.   On: 09/02/2018 09:19     PMFS History: Patient Active Problem List   Diagnosis Date Noted  . Facial rash 05/26/2018  . Shoulder arthritis 05/13/2018  . Status post reverse total replacement of right shoulder 05/13/2018  . Pain and swelling of lower leg, right 03/20/2018  . Epigastric pain 02/26/2018  . Acute on chronic congestive heart failure ([place]) 12/27/2017  . OSA (obstructive sleep apnea) 12/27/2017  . Health maintenance examination 11/25/2017  . Renal insufficiency  10/01/2017  . Fatty liver 10/01/2017  . Status post reverse total replacement of left shoulder 09/03/2017  . Bilateral shoulder pain 05/24/2017  . History of CVA in adulthood 03/08/2017  . S/P total knee arthroplasty, left 01/30/2017  . Arthritis of knee 01/15/2017  . Skin rash 12/10/2016  . Medicare annual wellness visit, initial 10/05/2016  . Advanced care planning/counseling discussion 10/05/2016  . Severe obesity (BMI 35.0-39.9) with comorbidity ([place]) 07/19/2016  . Hypothyroidism   . IDA (iron deficiency anemia) 04/23/2016  . Chronic pain syndrome 04/23/2016  . Inflammatory arthritis 04/23/2016  . Chronic leg pain 04/23/2016  . Lesion of palate 04/23/2016  . Grieving 04/23/2016  . GERD (gastroesophageal reflux disease)   . Duodenitis determined by biopsy 02/13/2016  . Diverticulosis of colon without hemorrhage 01/31/2016  . Chest pain 01/11/2012  . Migraine 01/11/2012  . History of methicillin resistant staphylococcus aureus (MRSA) 07/04/2010  . DEGENERATIVE DISC DISEASE 07/04/2010  . History of total right knee replacement 07/04/2010  . Primary osteoarthritis, right shoulder 06/09/2007  . Personal history of colonic adenoma 06/01/2003   Past Medical History:  Diagnosis Date  . Acute kidney failure 08/2008   "cleared up"  no problems since  . Allergy   . Anxiety   .  Arthritis   . Asthma    ?of this no inhaler  . Blood transfusion   . CHF (congestive heart failure) ([place])    ?of this, pt denies  . CLOSTRIDIUM DIFFICILE COLITIS 07/04/2010   Annotation: 12/09, 2/10 Qualifier: Diagnosis of  By: [name], [name]    . Depression   . Depression with anxiety   . Duodenitis determined by biopsy 02/2016   peptic likely due to aleve (erosive gastropathy with duodenal erosions)  . ECZEMA 07/04/2010   Qualifier: Diagnosis of  By: [name], [name]    . Elevated liver enzymes   . GERD (gastroesophageal reflux disease)   . Heart attack ([place])    08/2008 (likely demand ischemia in  the setting of MSSA sepsis/R TKA  infection)10-2008  . History of hiatal hernia   . History of stomach ulcers   . Hypothyroidism   . Lower GI bleeding   . MALAR AND MAXILLARY BONES CLOSED FRACTURE 07/04/2010   Annotation: ORIF Qualifier: Diagnosis of  By: [name], [name]    . Migraine    "definitely"  . MRSA (methicillin resistant Staphylococcus aureus)    in leg, had to place steel rod in leg  . Personal history of colonic adenoma 06/01/2003  . PFO (patent foramen ovale)    small PFO by 08/2008 TEE  . Pneumonia 08/2008   "while in ICU"  . Seizures ([place]) 2009   "long time ago"    . Stroke [facility]) 2010   unable to complete sentences at times    Family History  Problem Relation Age of Onset  . Coronary artery disease Father 59  . Stroke Father   . CAD Mother 32       stents  . Breast cancer Sister   . Diabetes Brother   . Diabetes Sister   . Colon cancer Neg Hx   . Stomach cancer Neg Hx     Past Surgical History:  Procedure Laterality Date  . anterior  nerve transposition  07/2009   left ulnar nerve  . antibiotic spacer exchange  11/2008; 08/2006   right knee  . ARTHROTOMY  08/2008   right knee w/I&D  . CARDIAC CATHETERIZATION  02/04/2018  . CARDIOVASCULAR STRESS TEST  2013   stress EKG - negative for ischemia, 4 min 7.3 METs, normal blood pressure response  . CATARACT EXTRACTION    . COLONOSCOPY  11/2012   diverticulosis, rpt 10 yrs [name])  . ESOPHAGOGASTRODUODENOSCOPY  02/2016   erosive gastropathy with duodenal erosions [name])  . HARDWARE REMOVAL  04/2006   right knee w/antibiotic spacers placed  . INGUINAL HERNIA REPAIR  early 1990's   bilateral  . JOINT REPLACEMENT Right    rt knee  . KNEE ARTHROSCOPY  10/2001   right  . KNEE FUSION  03/2009   right knee removal; antibiotic spacers;   . LEFT HEART CATH AND CORONARY ANGIOGRAPHY N/A 02/04/2018    WNL (Patwardhan, Reynold Bowen, MD)  . REPLACEMENT TOTAL KNEE  08/2008; 09/2001   right  . REVERSE SHOULDER  ARTHROPLASTY Left 09/03/2017   Procedure: REVERSE SHOULDER ARTHROPLASTY;  Surgeon: Meredith Pel, MD;  Location: [place];  Service: Orthopedics;  Laterality: Left;  . REVERSE SHOULDER ARTHROPLASTY Right 05/13/2018   Procedure: RIGHT REVERSE SHOULDER ARTHROPLASTY;  Surgeon: Meredith Pel, MD;  Location: [place];  Service: Orthopedics;  Laterality: Right;  . rod placement Right   . SYNOVECTOMY  06/2005   debridement, liner exchange right knee  . TOE AMPUTATION Left  08/2008   great toe - osteomyelitis (staph infection)  . TOE AMPUTATION Left 04/2016   2nd toe [name]  . TONSILLECTOMY    . TOTAL KNEE ARTHROPLASTY Left 01/15/2017  . TOTAL KNEE ARTHROPLASTY Left 01/15/2017   Procedure: TOTAL KNEE ARTHROPLASTY;  Surgeon: Meredith Pel, MD;  Location: [place];  Service: Orthopedics;  Laterality: Left;   Social History   Occupational History  . Occupation: retired  Tobacco Use  . Smoking status: Never Smoker  . Smokeless tobacco: Never Used  Substance and Sexual Activity  . Alcohol use: No    Alcohol/week: 0.0 standard drinks    Comment: "I abused alcohol; last drink  ~ 2005"  . Drug use: No  . Sexual activity: Never

## 2018-10-16 ENCOUNTER — Encounter: Payer: Self-pay | Admitting: Family Medicine

## 2018-10-16 DIAGNOSIS — Z01818 Encounter for other preprocedural examination: Secondary | ICD-10-CM | POA: Insufficient documentation

## 2018-10-16 NOTE — Patient Instructions (Addendum)
Xray today EKG today Labs today We will refer you to podiatrist for evaluation.  I will send today's note to Dr [name].

## 2018-10-16 NOTE — Progress Notes (Addendum)
BP 116/68 (BP Location: Left Arm, Patient Position: Sitting, Cuff Size: Large)   Pulse 66   Temp 97.8 F (36.6 C) (Oral)   Ht 5\' 9"  (1.753 m)   Wt 280 lb 12 oz (127.3 kg)   SpO2 94%   BMI 41.46 kg/m    CC: preop eval Subjective:    Patient ID: Nicholas [name], male    DOB: Jul 05, 1951, 68 y.o.   MRN: [id number]  HPI: Nicholas [name] is a 68 y.o. male presenting on 10/17/2018 for Pre-op Exam (Here for surgical clearance for back surgery. ) and Medication Refill (Requests alternative for Zantac. )   Sees Dr [name] for chronic low back pain with neurogenic claudication, latest MRI showed severe progressive spinal stenosis at L3/4 and L4/5 and stable L foraminal stenosis L5/S1. Planning 2 level decompression L3/4 and L4/5. Medical evaluation prior to surgery is requested today.   He has previously tolerated GETA well including bilateral shoulder replacements and total knee replacement/fusion, latest R reverse shoulder arthroplasty done 04/2018. He did have MSSA sepsis from R TKA infection and MI presumed from demand ischemia complicating knee surgery 2009. Also has h/o MRSA.   He did have recent cardiac evaluation with reassuring L heart catheterization by Dr Virgina Jock (01/2018).   He has a previous history of stroke and seizures when he was ill with MRSA infection. H/o small infract along surface of R frontal gyrus 2009 - not seen on rpt MRI 2011.  Intermittent headaches, better than in the past. Denies current chest pain, dyspnea, dizziness, abd pain, nausea/vomiting, UTI symptoms.   GERD - stable on daily protonix 40mg .      Relevant past medical, surgical, family and social history reviewed and updated as indicated. Interim medical history since our last visit reviewed. Allergies and medications reviewed and updated. Outpatient Medications Prior to Visit  Medication Sig Dispense Refill  . acetaminophen (TYLENOL) 500 MG tablet Take 1,000 mg by mouth 2 (two) times daily. IN THE AFTERNOON  & AT NIGHT    . amitriptyline (ELAVIL) 50 MG tablet Take 2 tablets (100 mg total) by mouth at bedtime. 60 tablet 1  . clotrimazole (LOTRIMIN) 1 % cream Apply 1 application topically 2 (two) times daily. To groin 60 g 1  . desonide (DESOWEN) 0.05 % cream Apply topically 2 (two) times daily. Apply to AA. Limit to 7-10 days at a time 30 g 0  . furosemide (LASIX) 20 MG tablet TAKE 1 TABLET BY MOUTH EVERY DAY AS NEEDED FOR FLUID OR EDEMA 90 tablet 1  . gabapentin (NEURONTIN) 300 MG capsule TAKE 1 CAPSULE BY MOUTH FOUR TIMES A DAY 360 capsule 1  . HYDROcodone-acetaminophen (NORCO) 10-325 MG tablet Take 1 tablet by mouth every 4 (four) hours as needed. Take 1 tablet every 4-6 hours as needed for pain  0  . hydrocortisone 2.5 % cream Apply 1 application topically at bedtime as needed (for rash).     [name] ketoconazole (NIZORAL) 2 % shampoo Apply 1 application topically 2 (two) times a week. 120 mL 0  . methocarbamol (ROBAXIN) 500 MG tablet 1 po q 12 hrs (Patient not taking: Reported on 10/01/2018) 30 tablet 0  . metoprolol succinate (TOPROL-XL) 100 MG 24 hr tablet Take 100 mg by mouth daily. Take with or immediately following a meal.    . Multiple Vitamin (MULTIVITAMIN WITH MINERALS) TABS tablet Take 1 tablet by mouth daily. Centrum Silver    . nitroGLYCERIN (NITROSTAT) 0.4 MG SL tablet Place 0.4 mg under  the tongue every 5 (five) minutes as needed for chest pain.    . pantoprazole (PROTONIX) 40 MG tablet TAKE 1 TABLET EVERY DAY (Patient taking differently: Take 40 mg by mouth daily) 30 tablet 11  . Probiotic Product (PROBIOTIC PO) Take 1 capsule daily by mouth.    . SUMAtriptan (IMITREX) 100 MG tablet Take 1 tablet (100 mg total) by mouth daily as needed for migraine (repeat in 2 hours if needed). Limit to 2/24 hour period 12 tablet 1  . triamcinolone cream (KENALOG) 0.5 % Apply 1 application topically 2 (two) times daily. (Patient taking differently: Apply 1 application topically 2 (two) times daily as needed  (for rash). ) 30 g 0  . aspirin 81 MG tablet Take 1 tablet (81 mg total) by mouth daily.    [name] HYDROcodone-acetaminophen (NORCO/VICODIN) 5-325 MG tablet 1 po q 12hrs prn pain 30 tablet 0  . levothyroxine (SYNTHROID, LEVOTHROID) 112 MCG tablet Take 1 tablet (112 mcg total) by mouth daily before breakfast. 90 tablet 1  . ranitidine (ZANTAC) 150 MG tablet TAKE 1 TABLET BY MOUTH AT BEDTIME 30 tablet 0  . ranitidine (ZANTAC) 150 MG tablet TAKE 1 TABLET BY MOUTH AT BEDTIME (Patient not taking: Reported on 10/17/2018) 30 tablet 3   No facility-administered medications prior to visit.      Per HPI unless specifically indicated in ROS section below Review of Systems Objective:    BP 116/68 (BP Location: Left Arm, Patient Position: Sitting, Cuff Size: Large)   Pulse 66   Temp 97.8 F (36.6 C) (Oral)   Ht 5\' 9"  (1.753 m)   Wt 280 lb 12 oz (127.3 kg)   SpO2 94%   BMI 41.46 kg/m   Wt Readings from Last 3 Encounters:  10/17/18 280 lb 12 oz (127.3 kg)  09/03/18 280 lb (127 kg)  05/26/18 264 lb 8 oz (120 kg)    Physical Exam Vitals signs and nursing note reviewed.  Constitutional:      General: He is not in acute distress.    Appearance: Normal appearance.  HENT:     Mouth/Throat:     Mouth: Mucous membranes are moist.     Pharynx: Oropharynx is clear.  Cardiovascular:     Rate and Rhythm: Normal rate and regular rhythm.     Pulses: Normal pulses.     Heart sounds: Normal heart sounds. No murmur.  Pulmonary:     Effort: Pulmonary effort is normal. No respiratory distress.     Breath sounds: Normal breath sounds. No wheezing, rhonchi or rales.  Musculoskeletal: Normal range of motion.        General: Deformity (L foot) present.     Right lower leg: No edema.     Left lower leg: No edema.     Comments: 2+ DP/PT on right, difficult to palpate pulses on left L foot with chronic deformity and collapse of longitudinal arch L foot s/p 1st/2nd toe amputations from prior sepsis R leg fixed in  extension  Skin:    General: Skin is warm and dry.     Findings: No erythema or rash.  Neurological:     Mental Status: He is alert.  Psychiatric:        Mood and Affect: Mood normal.        Behavior: Behavior normal.       Results for orders placed or performed in visit on 10/17/18  Protime-INR  Result Value Ref Range   INR 1.0 0.8 - 1.0 ratio  Prothrombin Time 11.7 9.6 - 13.1 sec  TSH  Result Value Ref Range   TSH 0.10 (L) 0.35 - 4.50 uIU/mL  CBC with Differential/Platelet  Result Value Ref Range   WBC 8.2 4.0 - 10.5 K/uL   RBC 4.86 4.22 - 5.81 Mil/uL   Hemoglobin 13.6 13.0 - 17.0 g/dL   HCT 41.4 39.0 - 52.0 %   MCV 85.0 78.0 - 100.0 fl   MCHC 33.0 30.0 - 36.0 g/dL   RDW 17.0 (H) 11.5 - 15.5 %   Platelets 246.0 150.0 - 400.0 K/uL   Neutrophils Relative % 57.8 43.0 - 77.0 %   Lymphocytes Relative 23.5 12.0 - 46.0 %   Monocytes Relative 14.8 (H) 3.0 - 12.0 %   Eosinophils Relative 3.1 0.0 - 5.0 %   Basophils Relative 0.8 0.0 - 3.0 %   Neutro Abs 4.7 1.4 - 7.7 K/uL   Lymphs Abs 1.9 0.7 - 4.0 K/uL   Monocytes Absolute 1.2 (H) 0.1 - 1.0 K/uL   Eosinophils Absolute 0.2 0.0 - 0.7 K/uL   Basophils Absolute 0.1 0.0 - 0.1 K/uL  Basic metabolic panel  Result Value Ref Range   Sodium 140 135 - 145 mEq/L   Potassium 4.0 3.5 - 5.1 mEq/L   Chloride 105 96 - 112 mEq/L   CO2 26 19 - 32 mEq/L   Glucose, Bld 122 (H) 70 - 99 mg/dL   BUN 27 (H) 6 - 23 mg/dL   Creatinine, Ser 1.38 0.40 - 1.50 mg/dL   Calcium 8.8 8.4 - 10.5 mg/dL   GFR 54.48 (L) >60.00 mL/min  Lipid panel  Result Value Ref Range   Cholesterol 178 0 - 200 mg/dL   Triglycerides 231.0 (H) 0.0 - 149.0 mg/dL   HDL 41.10 >39.00 mg/dL   VLDL 46.2 (H) 0.0 - 40.0 mg/dL   Total CHOL/HDL Ratio 4    NonHDL 137.30   LDL cholesterol, direct  Result Value Ref Range   Direct LDL 109.0 mg/dL   DG Chest 2 View CLINICAL DATA:  Preoperative evaluation.  EXAM: CHEST - 2 VIEW  COMPARISON:  January 04, 2017  FINDINGS: There is scarring in the lung base regions. There is no appreciable edema or consolidation. Heart size and pulmonary vascularity are normal. No adenopathy. There is degenerative change in the thoracic spine. There are total shoulder replacements bilaterally.  IMPRESSION: Bibasilar scarring. No edema or consolidation. Stable cardiac silhouette.  Electronically Signed   By: Lowella Grip III M.D.   On: 10/17/2018 08:23   EKG - NSR 60, LAD, normal intervals, no acute ST/T changes, poor R wave progression no change from prior except for low voltage Assessment & Plan:   Problem List Items Addressed This Visit    Spinal stenosis of lumbar region without neurogenic claudication    Upcoming 2 level decompression should do well with surgery [name]).       Renal insufficiency    Update labs.       Relevant Orders   Lipid panel (Completed)   Pre-op evaluation - Primary    RCRI = 2 (?CHF and CVD hx) however he underwent thorough unrevealing cardiac evaluation including catheterization earlier this year. Will update EKG and check CXR and labs today. If all stable, cleared from cardiac and medical standpoint to proceed with surgery.       Relevant Orders   DG Chest 2 View (Completed)   EKG 12-Lead (Completed)   Protime-INR (Completed)   CBC with Differential/Platelet (Completed)  Basic metabolic panel (Completed)   Hypothyroidism    Update TSH after increasing levothyroxine to 140mcg earlier this month.  TSH now low - will decrease levothyroxine to 159mcg daily.       Relevant Medications   levothyroxine (SYNTHROID, LEVOTHROID) 100 MCG tablet   Other Relevant Orders   TSH (Completed)   History of methicillin resistant staphylococcus aureus (MRSA)   History of CVA in adulthood    Previous stroke occurred during critical illness with sepsis.       Foot deformity    Chronic left foot deformity. Consider eval for orthotics.       Dyslipidemia    Not on  statin or aspirin. Will discuss benefits of both and consider starting.  The 10-year ASCVD risk score Mikey Bussing DC Brooke Bonito., et al., 2013) is: 12.9%   Values used to calculate the score:     Age: 32 years     Sex: Male     Is Non-Hispanic African American: No     Diabetic: No     Tobacco smoker: No     Systolic Blood Pressure: 226 mmHg     Is BP treated: No     HDL Cholesterol: 41.1 mg/dL     Total Cholesterol: 178 mg/dL       Relevant Orders   LDL cholesterol, direct (Completed)       Meds ordered this encounter  Medications  . levothyroxine (SYNTHROID, LEVOTHROID) 100 MCG tablet    Sig: Take 1 tablet (100 mcg total) by mouth daily before breakfast.    Dispense:  90 tablet    Refill:  1    Note new sig   Orders Placed This Encounter  Procedures  . DG Chest 2 View    Standing Status:   Future    Number of Occurrences:   1    Standing Expiration Date:   12/15/2019    Order Specific Question:   Reason for Exam (SYMPTOM  OR DIAGNOSIS REQUIRED)    Answer:   preop eval    Order Specific Question:   Preferred imaging location?    Answer:   [facility]    Order Specific Question:   Radiology Contrast Protocol - do NOT remove file path    Answer:   [email or web address]  . Protime-INR  . TSH  . CBC with Differential/Platelet  . Basic metabolic panel  . Lipid panel  . LDL cholesterol, direct  . EKG 12-Lead    Follow up plan: No follow-ups on file.  [name], MD

## 2018-10-17 ENCOUNTER — Ambulatory Visit (INDEPENDENT_AMBULATORY_CARE_PROVIDER_SITE_OTHER): Payer: PPO | Admitting: Family Medicine

## 2018-10-17 ENCOUNTER — Encounter: Payer: Self-pay | Admitting: Family Medicine

## 2018-10-17 ENCOUNTER — Ambulatory Visit (INDEPENDENT_AMBULATORY_CARE_PROVIDER_SITE_OTHER)
Admission: RE | Admit: 2018-10-17 | Discharge: 2018-10-17 | Disposition: A | Discharge status: Home / Self Care | Payer: PPO | Source: Ambulatory Visit | Attending: Family Medicine | Admitting: Family Medicine

## 2018-10-17 VITALS — BP 116/68 | HR 66 | Temp 97.8°F | Ht 69.0 in | Wt 280.8 lb

## 2018-10-17 DIAGNOSIS — E785 Hyperlipidemia, unspecified: Secondary | ICD-10-CM

## 2018-10-17 DIAGNOSIS — Z8614 Personal history of Methicillin resistant Staphylococcus aureus infection: Secondary | ICD-10-CM

## 2018-10-17 DIAGNOSIS — E039 Hypothyroidism, unspecified: Secondary | ICD-10-CM | POA: Diagnosis not present

## 2018-10-17 DIAGNOSIS — Z8673 Personal history of transient ischemic attack (TIA), and cerebral infarction without residual deficits: Secondary | ICD-10-CM

## 2018-10-17 DIAGNOSIS — Z01818 Encounter for other preprocedural examination: Secondary | ICD-10-CM | POA: Diagnosis not present

## 2018-10-17 DIAGNOSIS — M21962 Unspecified acquired deformity of left lower leg: Secondary | ICD-10-CM | POA: Diagnosis not present

## 2018-10-17 DIAGNOSIS — M48061 Spinal stenosis, lumbar region without neurogenic claudication: Secondary | ICD-10-CM | POA: Diagnosis not present

## 2018-10-17 DIAGNOSIS — J984 Other disorders of lung: Secondary | ICD-10-CM | POA: Diagnosis not present

## 2018-10-17 DIAGNOSIS — N289 Disorder of kidney and ureter, unspecified: Secondary | ICD-10-CM | POA: Diagnosis not present

## 2018-10-17 LAB — CBC WITH DIFFERENTIAL/PLATELET
Basophils Absolute: 0.1 10*3/uL (ref 0.0–0.1)
Basophils Relative: 0.8 % (ref 0.0–3.0)
Eosinophils Absolute: 0.2 10*3/uL (ref 0.0–0.7)
Eosinophils Relative: 3.1 % (ref 0.0–5.0)
HCT: 41.4 % (ref 39.0–52.0)
Hemoglobin: 13.6 g/dL (ref 13.0–17.0)
Lymphocytes Relative: 23.5 % (ref 12.0–46.0)
Lymphs Abs: 1.9 10*3/uL (ref 0.7–4.0)
MCHC: 33 g/dL (ref 30.0–36.0)
MCV: 85 fl (ref 78.0–100.0)
MONOS PCT: 14.8 % — AB (ref 3.0–12.0)
Monocytes Absolute: 1.2 10*3/uL — ABNORMAL HIGH (ref 0.1–1.0)
Neutro Abs: 4.7 10*3/uL (ref 1.4–7.7)
Neutrophils Relative %: 57.8 % (ref 43.0–77.0)
Platelets: 246 10*3/uL (ref 150.0–400.0)
RBC: 4.86 Mil/uL (ref 4.22–5.81)
RDW: 17 % — ABNORMAL HIGH (ref 11.5–15.5)
WBC: 8.2 10*3/uL (ref 4.0–10.5)

## 2018-10-17 LAB — LDL CHOLESTEROL, DIRECT: Direct LDL: 109 mg/dL

## 2018-10-17 LAB — BASIC METABOLIC PANEL WITH GFR
BUN: 27 mg/dL — ABNORMAL HIGH (ref 6–23)
CO2: 26 meq/L (ref 19–32)
Calcium: 8.8 mg/dL (ref 8.4–10.5)
Chloride: 105 meq/L (ref 96–112)
Creatinine, Ser: 1.38 mg/dL (ref 0.40–1.50)
GFR: 54.48 mL/min — ABNORMAL LOW
Glucose, Bld: 122 mg/dL — ABNORMAL HIGH (ref 70–99)
Potassium: 4 meq/L (ref 3.5–5.1)
Sodium: 140 meq/L (ref 135–145)

## 2018-10-17 LAB — LIPID PANEL
Cholesterol: 178 mg/dL (ref 0–200)
HDL: 41.1 mg/dL
NonHDL: 137.3
Total CHOL/HDL Ratio: 4
Triglycerides: 231 mg/dL — ABNORMAL HIGH (ref 0.0–149.0)
VLDL: 46.2 mg/dL — ABNORMAL HIGH (ref 0.0–40.0)

## 2018-10-17 LAB — PROTIME-INR
INR: 1 ratio (ref 0.8–1.0)
PROTHROMBIN TIME: 11.7 s (ref 9.6–13.1)

## 2018-10-17 LAB — TSH: TSH: 0.1 u[IU]/mL — ABNORMAL LOW (ref 0.35–4.50)

## 2018-10-19 DIAGNOSIS — M21969 Unspecified acquired deformity of unspecified lower leg: Secondary | ICD-10-CM | POA: Insufficient documentation

## 2018-10-19 DIAGNOSIS — E785 Hyperlipidemia, unspecified: Secondary | ICD-10-CM | POA: Insufficient documentation

## 2018-10-19 NOTE — Assessment & Plan Note (Addendum)
Update TSH after increasing levothyroxine to 160mcg earlier this month.  TSH now low - will decrease levothyroxine to 149mcg daily.

## 2018-10-19 NOTE — Assessment & Plan Note (Signed)
Chronic left foot deformity. Consider eval for orthotics.

## 2018-10-19 NOTE — Assessment & Plan Note (Signed)
Previous stroke occurred during critical illness with sepsis.

## 2018-10-19 NOTE — Assessment & Plan Note (Signed)
Update labs.  

## 2018-10-19 NOTE — Assessment & Plan Note (Signed)
RCRI = 2 (?CHF and CVD hx) however he underwent thorough unrevealing cardiac evaluation including catheterization earlier this year. Will update EKG and check CXR and labs today. If all stable, cleared from cardiac and medical standpoint to proceed with surgery.

## 2018-10-19 NOTE — Assessment & Plan Note (Signed)
Upcoming 2 level decompression should do well with surgery [name]).

## 2018-10-19 NOTE — Assessment & Plan Note (Signed)
Not on statin or aspirin. Will discuss benefits of both and consider starting.  The 10-year ASCVD risk score Nicholas [name] Nicholas [name]., et al., 2013) is: 12.9%   Values used to calculate the score:     Age: 68 years     Sex: Male     Is Non-Hispanic African American: No     Diabetic: No     Tobacco smoker: No     Systolic Blood Pressure: 814 mmHg     Is BP treated: No     HDL Cholesterol: 41.1 mg/dL     Total Cholesterol: 178 mg/dL

## 2018-10-20 MED ORDER — LEVOTHYROXINE SODIUM 100 MCG PO TABS: 100.0000 ug | ORAL_TABLET | Freq: Every day | ORAL | 1 refills | Status: DC

## 2018-10-20 NOTE — Addendum Note (Signed)
Addended by: [name] on: 10/20/2018 06:24 PM   Modules accepted: Orders

## 2018-10-21 ENCOUNTER — Other Ambulatory Visit: Payer: Self-pay | Admitting: Family Medicine

## 2018-10-21 MED ORDER — ATORVASTATIN CALCIUM 20 MG PO TABS: 20.0000 mg | ORAL_TABLET | Freq: Every day | ORAL | 3 refills | Status: DC

## 2018-10-22 ENCOUNTER — Telehealth (INDEPENDENT_AMBULATORY_CARE_PROVIDER_SITE_OTHER): Payer: Self-pay | Admitting: Radiology

## 2018-10-22 NOTE — Telephone Encounter (Signed)
Patient had his preop eval, can you call him about scheduling surgery?  I was not sure if you sent actual surgery clearance request on him.  He is coming in to the office Friday AM to see me for something- maybe we can update him then?

## 2018-10-31 ENCOUNTER — Ambulatory Visit (INDEPENDENT_AMBULATORY_CARE_PROVIDER_SITE_OTHER): Payer: PPO | Admitting: Orthopaedic Surgery

## 2018-11-04 ENCOUNTER — Ambulatory Visit (INDEPENDENT_AMBULATORY_CARE_PROVIDER_SITE_OTHER): Payer: PPO | Admitting: Orthopaedic Surgery

## 2018-11-04 ENCOUNTER — Encounter (INDEPENDENT_AMBULATORY_CARE_PROVIDER_SITE_OTHER): Payer: Self-pay | Admitting: Orthopaedic Surgery

## 2018-11-04 ENCOUNTER — Telehealth: Payer: Self-pay | Admitting: *Deleted

## 2018-11-04 VITALS — BP 124/71 | HR 56 | Ht 69.0 in | Wt 275.0 lb

## 2018-11-04 DIAGNOSIS — M48061 Spinal stenosis, lumbar region without neurogenic claudication: Secondary | ICD-10-CM

## 2018-11-04 NOTE — Progress Notes (Signed)
68 year old white male returns.  He has known history of severe L3-4 and L4-5 stenosis with chronic low back pain, neurogenic claudication.  We are awaiting preop clearance from Dr. [name].  Patient states that symptoms unchanged.  We will await preop medical/cardiac clearance from Dr. Danise Mina.  Once we receive this patient will follow-up with me next week for detailed office visit.  All questions answered

## 2018-11-04 NOTE — Telephone Encounter (Signed)
Spoke to surgery scheduling center for orth[facility]edics to states pt is in office for past and present history for upcoming surgery. Advised pt was seen for surgical clearance and given an EKG. They are needing a form completed indicating pt is cleared; Direct fax provided. Requesting it be completed asap, as pt is in office.Advised Dr Darnell Level currently in clinic and may not be able to complete expeditiously.

## 2018-11-04 NOTE — Telephone Encounter (Signed)
Received faxed Pre-op Consult form from [place].   Form was completed and signed by Dr. Granville Lewis form to ATTN:  Debbie at [phone].

## 2018-11-06 ENCOUNTER — Other Ambulatory Visit: Payer: Self-pay | Admitting: Family Medicine

## 2018-11-06 DIAGNOSIS — E039 Hypothyroidism, unspecified: Secondary | ICD-10-CM

## 2018-11-11 ENCOUNTER — Other Ambulatory Visit: Payer: PPO

## 2018-11-12 ENCOUNTER — Encounter (INDEPENDENT_AMBULATORY_CARE_PROVIDER_SITE_OTHER): Payer: Self-pay | Admitting: Surgery

## 2018-11-12 ENCOUNTER — Other Ambulatory Visit: Payer: Self-pay | Admitting: Family Medicine

## 2018-11-12 ENCOUNTER — Ambulatory Visit (INDEPENDENT_AMBULATORY_CARE_PROVIDER_SITE_OTHER): Payer: PPO | Admitting: Surgery

## 2018-11-12 VITALS — BP 118/72 | HR 53 | Temp 98.3°F | Ht 69.0 in | Wt 275.0 lb

## 2018-11-12 DIAGNOSIS — K269 Duodenal ulcer, unspecified as acute or chronic, without hemorrhage or perforation: Secondary | ICD-10-CM

## 2018-11-12 DIAGNOSIS — M48061 Spinal stenosis, lumbar region without neurogenic claudication: Secondary | ICD-10-CM

## 2018-11-12 DIAGNOSIS — K2960 Other gastritis without bleeding: Secondary | ICD-10-CM

## 2018-11-12 DIAGNOSIS — L97521 Non-pressure chronic ulcer of other part of left foot limited to breakdown of skin: Secondary | ICD-10-CM | POA: Diagnosis not present

## 2018-11-12 NOTE — Progress Notes (Signed)
68 year old white male history of L3-4 and L4-5 small stenosis comes in for preop evaluation.  Symptoms unchanged from last office visit.  He is wanting to proceed with L3-4 and L4-5 decompression  as scheduled.  We have received preop medical and cardiac clearance.  Surgical procedure discussed.  All questions answered.  Patient described having a painful callus on the left plantar MTP joint.  On exam there are no signs infection.  He has had previous great toe and second toe amputation several years ago.  Thickened callus at the first MTP joint that is moderate to markedly tender.  I asked Dr. Jess Barters practitioner to see patient today for possible debridement of the callus.

## 2018-11-12 NOTE — Progress Notes (Signed)
Office Visit Note   Patient: Nicholas [name]           Date of Birth: [birth year]           MRN: [id number] Visit Date: 11/12/2018              Requested by: [name], MD [address] [place], [place] [address] PCP: [name], MD  Chief Complaint  Patient presents with  . Spine - Follow-up      HPI: The patient is a 68 year old gentleman seen today for initial evaluation of ulceration to left foot plantar aspect. Is painful. Been present for many months. No injury. No drainage or subjective fevers. Does have history of great and second toe amputations on left. In regular shoe wear.  Assessment & Plan: Visit Diagnoses:  1. Skin ulcer of plantar aspect of foot, left, limited to breakdown of skin ([place])   2. Spinal stenosis of lumbar region without neurogenic claudication     Plan: discussed importance of heel cord stretching. Follow up as needed. Will monitor feet closely.  Follow-Up Instructions: Return for Needs return office visit 1 week postop.   Ortho Exam  Patient is alert, oriented, no adenopathy, well-dressed, normal affect, normal respiratory effort. On examination of LLE has heel cord tightness with dorsiflexion to neutral. palable dp pulse. Has 15 mm in diameter calllused ulceration beneath first metatarsal head. This is 3 mm deep. Pared with a 10 blade knife back to viable tissue. Patient voiced relief. No erythema, no drainage. No edema no sign of infection.  Imaging: No results found. No images are attached to the encounter.  Labs: Lab Results  Component Value Date   HGBA1C 5.6 07/13/2010   HGBA1C  12/08/2008    5.2 (NOTE)   The ADA recommends the following therapeutic goal for glycemic   control related to Hgb A1C measurement:   Goal of Therapy:   < 7.0% Hgb A1C   Reference: American Diabetes Association: Clinical Practice   Recommendations 2008, Diabetes Care,  2008, 31:(Suppl 1).   HGBA1C  11/23/2008    5.1 (NOTE)   The ADA  recommends the following therapeutic goal for glycemic   control related to Hgb A1C measurement:   Goal of Therapy:   < 7.0% Hgb A1C   Reference: American Diabetes Association: Clinical Practice   Recommendations 2008, Diabetes Care,  2008, 31:(Suppl 1).   ESRSEDRATE 22 (H) 01/11/2012   ESRSEDRATE 44 (H) 04/21/2009   LABURIC 5.0 02/08/2016   REPTSTATUS 05/07/2018 FINAL 05/05/2018   GRAMSTAIN  03/29/2009    NO WBC SEEN NO SQUAMOUS EPITHELIAL CELLS SEEN NO ORGANISMS SEEN   GRAMSTAIN  03/29/2009    NO WBC SEEN NO SQUAMOUS EPITHELIAL CELLS SEEN NO ORGANISMS SEEN   CULT (A) 05/05/2018    <10,000 COLONIES/mL INSIGNIFICANT GROWTH Performed at [facility], [address], [place], [place] [address]    LABORGA STAPHYLOCOCCUS SPECIES (COAGULASE NEGATIVE) 11/21/2008     Lab Results  Component Value Date   ALBUMIN 4.0 12/27/2017   ALBUMIN 3.6 10/01/2017   ALBUMIN 4.1 08/21/2017   LABURIC 5.0 02/08/2016    Body mass index is 40.61 kg/m.  Orders:  No orders of the defined types were placed in this encounter.  No orders of the defined types were placed in this encounter.    Procedures: No procedures performed  Clinical Data: No additional findings.  ROS:  All other systems negative, except as noted in the HPI. Review  of Systems  Constitutional: Negative for chills and fever.  Cardiovascular: Negative for leg swelling.  Musculoskeletal: Negative for gait problem.  Skin: Negative for color change.    Objective: Vital Signs: BP 118/72 (BP Location: Left Arm)   Pulse (!) 53   Temp 98.3 F (36.8 C) (Oral)   Ht 5\' 9"  (1.753 m)   Wt 275 lb (124.7 kg)   BMI 40.61 kg/m   Specialty Comments:  No specialty comments available.  PMFS History: Patient Active Problem List   Diagnosis Date Noted  . Dyslipidemia 10/19/2018  . Foot deformity 10/19/2018  . Pre-op evaluation 10/16/2018  . Spinal stenosis of lumbar region without neurogenic claudication 10/01/2018  .  Facial rash 05/26/2018  . Shoulder arthritis 05/13/2018  . Status post reverse total replacement of right shoulder 05/13/2018  . Pain and swelling of lower leg, right 03/20/2018  . Epigastric pain 02/26/2018  . CHF (congestive heart failure) ([place]) 12/27/2017  . OSA (obstructive sleep apnea) 12/27/2017  . Health maintenance examination 11/25/2017  . Renal insufficiency 10/01/2017  . Fatty liver 10/01/2017  . Status post reverse total replacement of left shoulder 09/03/2017  . Bilateral shoulder pain 05/24/2017  . History of CVA in adulthood 03/08/2017  . S/P total knee arthroplasty, left 01/30/2017  . Arthritis of knee 01/15/2017  . Skin rash 12/10/2016  . Medicare annual wellness visit, initial 10/05/2016  . Advanced care planning/counseling discussion 10/05/2016  . Severe obesity (BMI 35.0-39.9) with comorbidity ([place]) 07/19/2016  . Hypothyroidism   . IDA (iron deficiency anemia) 04/23/2016  . Chronic pain syndrome 04/23/2016  . Inflammatory arthritis 04/23/2016  . Chronic leg pain 04/23/2016  . Lesion of palate 04/23/2016  . Grieving 04/23/2016  . GERD (gastroesophageal reflux disease)   . Duodenitis determined by biopsy 02/13/2016  . Diverticulosis of colon without hemorrhage 01/31/2016  . Chest pain 01/11/2012  . Migraine 01/11/2012  . History of methicillin resistant staphylococcus aureus (MRSA) 07/04/2010  . DEGENERATIVE DISC DISEASE 07/04/2010  . History of total right knee replacement 07/04/2010  . Primary osteoarthritis, right shoulder 06/09/2007  . Personal history of colonic adenoma 06/01/2003   Past Medical History:  Diagnosis Date  . Acute kidney failure 08/2008   "cleared up"  no problems since  . Allergy   . Anxiety   . Arthritis   . Asthma    ?of this no inhaler  . Blood transfusion   . CHF (congestive heart failure) ([place])    ?of this, pt denies  . CLOSTRIDIUM DIFFICILE COLITIS 07/04/2010   Annotation: 12/09, 2/10 Qualifier: Diagnosis of  By: Megan Salon  MD, [name]    . Depression with anxiety   . Duodenitis determined by biopsy 02/2016   peptic likely due to aleve (erosive gastropathy with duodenal erosions)  . ECZEMA 07/04/2010   Qualifier: Diagnosis of  By: [name], [name]    . Elevated liver enzymes   . GERD (gastroesophageal reflux disease)   . Heart attack ([place])    08/2008 (likely demand ischemia in the setting of MSSA sepsis/R TKA  infection)10-2008  . History of hiatal hernia   . History of stomach ulcers   . Hypothyroidism   . Lower GI bleeding   . MALAR AND MAXILLARY BONES CLOSED FRACTURE 07/04/2010   Annotation: ORIF Qualifier: Diagnosis of  By: [name], [name]    . Migraine    "definitely"  . MRSA (methicillin resistant Staphylococcus aureus)    in leg, had to place steel rod in leg  . Personal history  of colonic adenoma 06/01/2003  . PFO (patent foramen ovale)    small PFO by 08/2008 TEE  . Pneumonia 08/2008   "while in ICU"  . Seizures ([place]) 2009   "long time ago"    . Stroke [facility]) 2010   unable to complete sentences at times    Family History  Problem Relation Age of Onset  . Coronary artery disease Father 58  . Stroke Father   . CAD Mother 33       stents  . Breast cancer Sister   . Diabetes Brother   . Diabetes Sister   . Colon cancer Neg Hx   . Stomach cancer Neg Hx     Past Surgical History:  Procedure Laterality Date  . anterior  nerve transposition  07/2009   left ulnar nerve  . antibiotic spacer exchange  11/2008; 08/2006   right knee  . ARTHROTOMY  08/2008   right knee w/I&D  . CARDIOVASCULAR STRESS TEST  2013   stress EKG - negative for ischemia, 4 min 7.3 METs, normal blood pressure response  . CATARACT EXTRACTION    . COLONOSCOPY  11/2012   diverticulosis, rpt 10 yrs [name])  . ESOPHAGOGASTRODUODENOSCOPY  02/2016   erosive gastropathy with duodenal erosions [name])  . HARDWARE REMOVAL  04/2006   right knee w/antibiotic spacers placed  . INGUINAL HERNIA REPAIR  early 1990's   bilateral   . JOINT REPLACEMENT Right    rt knee  . KNEE ARTHROSCOPY  10/2001   right  . KNEE FUSION  03/2009   right knee removal; antibiotic spacers;   . LEFT HEART CATH AND CORONARY ANGIOGRAPHY N/A 02/04/2018    WNL (Patwardhan, Reynold Bowen, MD)  . REPLACEMENT TOTAL KNEE  08/2008; 09/2001   right  . REVERSE SHOULDER ARTHROPLASTY Left 09/03/2017   Procedure: REVERSE SHOULDER ARTHROPLASTY;  Surgeon: Meredith Pel, MD;  Location: [place];  Service: Orthopedics;  Laterality: Left;  . REVERSE SHOULDER ARTHROPLASTY Right 05/13/2018   Procedure: RIGHT REVERSE SHOULDER ARTHROPLASTY;  Surgeon: Meredith Pel, MD;  Location: [place];  Service: Orthopedics;  Laterality: Right;  . rod placement Right   . SYNOVECTOMY  06/2005   debridement, liner exchange right knee  . TOE AMPUTATION Left 08/2008   great toe - osteomyelitis (staph infection)  . TOE AMPUTATION Left 04/2016   2nd toe [name]  . TONSILLECTOMY    . TOTAL KNEE ARTHROPLASTY Left 01/15/2017  . TOTAL KNEE ARTHROPLASTY Left 01/15/2017   Procedure: TOTAL KNEE ARTHROPLASTY;  Surgeon: Meredith Pel, MD;  Location: [place];  Service: Orthopedics;  Laterality: Left;   Social History   Occupational History  . Occupation: retired  Tobacco Use  . Smoking status: Never Smoker  . Smokeless tobacco: Never Used  Substance and Sexual Activity  . Alcohol use: No    Alcohol/week: 0.0 standard drinks    Comment: "I abused alcohol; last drink  ~ 2005"  . Drug use: No  . Sexual activity: Never

## 2018-11-12 NOTE — Addendum Note (Signed)
Addended by: Dondra Prader R on: 11/12/2018 12:47 PM   Modules accepted: Level of Service

## 2018-11-16 ENCOUNTER — Other Ambulatory Visit: Payer: Self-pay | Admitting: Family Medicine

## 2018-11-16 DIAGNOSIS — E785 Hyperlipidemia, unspecified: Secondary | ICD-10-CM

## 2018-11-17 NOTE — Pre-Procedure Instructions (Signed)
Nicholas [name]  11/17/2018      [id number] [address], [place] [address] Phone: [phone] Fax: [phone]    Your procedure is scheduled on Friday February 7th.  Report to [facility] Admitting at 8:00 A.M.  Call this number if you have problems the morning of surgery:  [phone]   Remember:  Do not eat or drink after midnight.    Take these medicines the morning of surgery with A SIP OF WATER  atorvastatin (LIPITOR)  gabapentin (NEURONTIN) levothyroxine (SYNTHROID, LEVOTHROID) metoprolol (TOPROL-XL) pantoprazole (PROTONIX) acetaminophen (TYLENOL) -if needed  SUMAtriptan (IMITREX)-if needed nitroGLYCERIN (NITROSTAT)-if needed  Follow your surgeon's instructions on when to stop Asprin.  If no instructions were given by your surgeon then you will need to call the office to get those instructions.    7 days prior to surgery STOP taking any Aspirin(unless otherwise instructed by your surgeon), Aleve, Naproxen, Ibuprofen, Motrin, Advil, Goody's, BC's, all herbal medications, fish oil, and all vitamins    Do not wear jewelry  Do not wear lotions, powders, or colognes, or deodorant.  Men may shave face and neck.  Do not bring valuables to the hospital.  [facility] is not responsible for any belongings or valuables.  Contacts, eyeglasses, hearing aids, dentures or bridgework may not be worn into surgery.  Leave your suitcase in the car.  After surgery it may be brought to your room.  For patients admitted to the hospital, discharge time will be determined by your treatment team.  Patients discharged the day of surgery will not be allowed to drive home.   [place]- Preparing For Surgery  Before surgery, you can play an important role. Because skin is not sterile, your skin needs to be as free of germs as possible. You can reduce the number of germs on your skin by washing with CHG (chlorahexidine  gluconate) Soap before surgery.  CHG is an antiseptic cleaner which kills germs and bonds with the skin to continue killing germs even after washing.    Oral Hygiene is also important to reduce your risk of infection.  Remember - BRUSH YOUR TEETH THE MORNING OF SURGERY WITH YOUR REGULAR TOOTHPASTE  Please do not use if you have an allergy to CHG or antibacterial soaps. If your skin becomes reddened/irritated stop using the CHG.  Do not shave (including legs and underarms) for at least 48 hours prior to first CHG shower. It is OK to shave your face.  Please follow these instructions carefully.   1. Shower the NIGHT BEFORE SURGERY and the MORNING OF SURGERY with CHG.   2. If you chose to wash your hair, wash your hair first as usual with your normal shampoo.  3. After you shampoo, rinse your hair and body thoroughly to remove the shampoo.  4. Use CHG as you would any other liquid soap. You can apply CHG directly to the skin and wash gently with a scrungie or a clean washcloth.   5. Apply the CHG Soap to your body ONLY FROM THE NECK DOWN.  Do not use on open wounds or open sores. Avoid contact with your eyes, ears, mouth and genitals (private parts). Wash Face and genitals (private parts)  with your normal soap.  6. Wash thoroughly, paying special attention to the area where your surgery will be performed.  7. Thoroughly rinse your body with warm water from the neck down.  8. DO NOT shower/wash  with your normal soap after using and rinsing off the CHG Soap.  9. Pat yourself dry with a CLEAN TOWEL.  10. Wear CLEAN PAJAMAS to bed the night before surgery, wear comfortable clothes the morning of surgery  11. Place CLEAN SHEETS on your bed the night of your first shower and DO NOT SLEEP WITH PETS.    Day of Surgery: Shower as stated above. Do not apply any deodorants/lotions.  Please wear clean clothes to the hospital/surgery center.   Remember to brush your teeth WITH YOUR REGULAR  TOOTHPASTE.   Please read over the following fact sheets that you were given.

## 2018-11-18 ENCOUNTER — Encounter (HOSPITAL_COMMUNITY): Payer: Self-pay

## 2018-11-18 ENCOUNTER — Encounter (HOSPITAL_COMMUNITY)
Admission: RE | Admit: 2018-11-18 | Discharge: 2018-11-18 | Disposition: A | Discharge status: Home / Self Care | Payer: PPO | Source: Ambulatory Visit | Attending: Orthopaedic Surgery | Admitting: Orthopaedic Surgery

## 2018-11-18 ENCOUNTER — Other Ambulatory Visit: Payer: Self-pay

## 2018-11-18 DIAGNOSIS — Z01812 Encounter for preprocedural laboratory examination: Secondary | ICD-10-CM | POA: Diagnosis not present

## 2018-11-18 LAB — COMPREHENSIVE METABOLIC PANEL
ALT: 22 U/L (ref 0–44)
AST: 27 U/L (ref 15–41)
Albumin: 3.7 g/dL (ref 3.5–5.0)
Alkaline Phosphatase: 93 U/L (ref 38–126)
Anion gap: 10 (ref 5–15)
BUN: 20 mg/dL (ref 8–23)
CHLORIDE: 105 mmol/L (ref 98–111)
CO2: 22 mmol/L (ref 22–32)
CREATININE: 1.33 mg/dL — AB (ref 0.61–1.24)
Calcium: 8.7 mg/dL — ABNORMAL LOW (ref 8.9–10.3)
GFR calc Af Amer: >60 mL/min (ref >60)
GFR calc non Af Amer: 55 mL/min — ABNORMAL LOW (ref >60)
Glucose, Bld: 135 mg/dL — ABNORMAL HIGH (ref 70–99)
Potassium: 4.1 mmol/L (ref 3.5–5.1)
Sodium: 137 mmol/L (ref 135–145)
Total Bilirubin: 1.2 mg/dL (ref 0.3–1.2)
Total Protein: 7.5 g/dL (ref 6.5–8.1)

## 2018-11-18 LAB — URINALYSIS, ROUTINE W REFLEX MICROSCOPIC
Bilirubin Urine: NEGATIVE
Glucose, UA: NEGATIVE mg/dL
Hgb urine dipstick: NEGATIVE
Ketones, ur: NEGATIVE mg/dL
Leukocytes, UA: NEGATIVE
Nitrite: NEGATIVE
Protein, ur: NEGATIVE mg/dL
SPECIFIC GRAVITY, URINE: 1.01 (ref 1.005–1.030)
pH: 7 (ref 5.0–8.0)

## 2018-11-18 LAB — CBC
HCT: 44.6 % (ref 39.0–52.0)
Hemoglobin: 14 g/dL (ref 13.0–17.0)
MCH: 28.2 pg (ref 26.0–34.0)
MCHC: 31.4 g/dL (ref 30.0–36.0)
MCV: 89.7 fL (ref 80.0–100.0)
Platelets: 236 10*3/uL (ref 150–400)
RBC: 4.97 MIL/uL (ref 4.22–5.81)
RDW: 15.4 % (ref 11.5–15.5)
WBC: 9.9 10*3/uL (ref 4.0–10.5)
nRBC: 0 % (ref 0.0–0.2)

## 2018-11-18 LAB — SURGICAL PCR SCREEN
MRSA, PCR: POSITIVE — AB
Staphylococcus aureus: POSITIVE — AB

## 2018-11-18 NOTE — Progress Notes (Signed)
PCP - Dr. Philip Aspen Cardiologist - Dr. Einar Gip  Chest x-ray - 10/17/18 EKG - 10/20/18 Stress Test - 05/10/16 ECHO - 01/09/18 Cardiac Cath - 02/04/18  Sleep Study - "Several years ago" CPAP - does not use CPAP   Aspirin Instructions: Patient instructed to hold all Aspirin, NSAID's, herbal medications, fish oil and vitamins 7 days prior to surgery.   Anesthesia review: cardiac history  Patient denies shortness of breath, fever, cough and chest pain at PAT appointment   Patient verbalized understanding of instructions that were given to them at the PAT appointment. Patient was also instructed that they will need to review over the PAT instructions again at home before surgery.

## 2018-11-19 ENCOUNTER — Ambulatory Visit: Payer: Self-pay

## 2018-11-19 ENCOUNTER — Encounter (HOSPITAL_COMMUNITY): Payer: Self-pay | Admitting: Vascular Surgery

## 2018-11-19 NOTE — Anesthesia Preprocedure Evaluation (Deleted)
Anesthesia Evaluation Anesthesia Physical Anesthesia Plan  ASA:   Anesthesia Plan:    Post-op Pain Management:    Induction:   PONV Risk Score and Plan:   Airway Management Planned:   Additional Equipment:   Intra-op Plan:   Post-operative Plan:   Informed Consent:   Plan Discussed with:   Anesthesia Plan Comments: (See PAT note by Karoline Caldwell, PA-C )        Anesthesia Quick Evaluation

## 2018-11-19 NOTE — Progress Notes (Signed)
Anesthesia Chart Review:  Case:  [id number] Date/Time:  11/21/18 0948   Procedure:  L3-4, L4-5 LUMBAR LAMINECTOMY/DECOMPRESSION (N/A )   Anesthesia type:  Choice   Pre-op diagnosis:  L3-4, L4-5 LUMBAR SPINAL STENOSIS   Location:  [facility] ROOM 06 / [facility] OR   Surgeon:  [name], MD      DISCUSSION: 68 yo male never smoker for above procedure. Pertinent hx includes PUD, Migraine HA, Asthma, GERD, Hiatal Hernia, Hypothyroid, MI 08/2008 (possible demand ischemiain the setting of MSSA sepsis), CVA (in setting of sepsis 2009), seizure (remote), right TKA 09/18/01 s/p removal 04/11/06 (MSSA infection) with revision 06/25/06 s/p removal and left great toe amputation 09/07/08(in setting of MSSA sepsis with right knee septic arthritis and acute pyelonephritis with septic emboli),s/p right knee arthroplasty removal and knee fusion 03/29/09, left TKA 01/15/17, left total shoulder 08/27/2017, right total shoulder 05/14/2018. OSA screening score of 5.  Pt was cleared by cardiologist Dr. Einar Gip 02/13/2018 prior to undergoing right total shoulder 05/14/2018. Per his note "due to recurrent chest tightness, mildly abnormal nuclear stress test, underwent coronary angiography with essentially normal coronary arteries.  Suspect his chest pain is related to noncardiac etiology probably GERD.  Dyspnea is related to obesity and deconditioning.  He does have right knee arthrodesis from recurrent right knee infection and he now has a rod unable to bend his right knee.  He has chronic right leg edema.  He has responded to Lasix and suspect it is due to arthritides.  If his leg edema does not improve or continues to persist, consider lower extremity venous insufficiency study.  He is scheduled for right shoulder repair, he can be taken up for the surgery with acceptable cardiovascular risk.  From cardiac standpoint remains stable, I will see him back on a prn basis."  Pt was seen by PCP 10/19/2018 and cleared for upcoming surgery. Per his  note "RCRI = 2 (?CHF and CVD hx) however he underwent thorough unrevealing cardiac evaluation including catheterization earlier this year. Will update EKG and check CXR and labs today. If all stable, cleared from cardiac and medical standpoint to proceed with surgery."  EKG showed NSR 60, LAD, normal intervals, no acute ST/T changes, poor R wave progression. Labs were unremarkable.   Anticipate he can proceed as planned barring acute status change  VS: BP (!) 127/51   Pulse (!) 58   Temp 36.4 C (Oral)   Resp 18   Ht 5\' 9"  (1.753 m)   Wt 126.5 kg   SpO2 95%   BMI 41.18 kg/m   PROVIDERS: [name], MD is PCP  Adrian Prows, MD is Cardiologist, sees PRN  LABS: Labs reviewed: Acceptable for surgery. (all labs ordered are listed, but only abnormal results are displayed)  Labs Reviewed  SURGICAL PCR SCREEN - Abnormal; Notable for the following components:      Result Value   MRSA, PCR POSITIVE (*)    Staphylococcus aureus POSITIVE (*)    All other components within normal limits  COMPREHENSIVE METABOLIC PANEL - Abnormal; Notable for the following components:   Glucose, Bld 135 (*)    Creatinine, Ser 1.33 (*)    Calcium 8.7 (*)    GFR calc non Af Amer 55 (*)    All other components within normal limits  URINALYSIS, ROUTINE W REFLEX MICROSCOPIC - Abnormal; Notable for the following components:   Color, Urine STRAW (*)    All other components within normal limits  CBC  IMAGES: CHEST - 2 VIEW 10/17/2018:  COMPARISON:  January 04, 2017  FINDINGS: There is scarring in the lung base regions. There is no appreciable edema or consolidation. Heart size and pulmonary vascularity are normal. No adenopathy. There is degenerative change in the thoracic spine. There are total shoulder replacements bilaterally.  IMPRESSION: Bibasilar scarring. No edema or consolidation. Stable cardiac silhouette.  EKG: 10/20/2018: NSR 60, LAD, normal intervals, no acute ST/T changes, poor R  wave progression.  CV: Cath 02/04/2018: Normal coronary arteries without any significant coronary artery disease Normal LVEDP Normal LVEF  Chest pain likely noncardiac in origin. Proceed with shoulder surgery with low cardiac risk.  Nuclear Stress 01/03/2018 [facility] cardiovascular): The overall quality of the study is fair.  Left ventricular cavity is noted to be normal on the rest and stress studies.  Gated SPECT images reveal normal myocardial thickening and wall motion.  LVEF 48%.  SPECT rest images show a large area of basal to apical inferior, inferior apical, basal inferoseptal and inferolateral perfusion defect of mild to moderate intensity.  These defects may be related to soft tissue attenuation of rest.  However, SPECT stress images only demonstrate a small area of mild intensity perfusion defect in mid to basal inferior myocardium.  This may suggest small area of mild ischemia in the mid to basal inferior myocardium.  Low to intermediate risk study due to reduced LVEF.  Findings may represent nonischemic cardiomyopathy.  Clinical correlation recommended.  Past Medical History:  Diagnosis Date  . Acute kidney failure 08/2008   "cleared up"  no problems since  . Allergy   . Anxiety   . Arthritis   . Asthma    ?of this no inhaler  . Blood transfusion   . CHF (congestive heart failure) ([place])    ?of this, pt denies  . CLOSTRIDIUM DIFFICILE COLITIS 07/04/2010   Annotation: 12/09, 2/10 Qualifier: Diagnosis of  By: [name], [name]    . Depression with anxiety   . Duodenitis determined by biopsy 02/2016   peptic likely due to aleve (erosive gastropathy with duodenal erosions)  . ECZEMA 07/04/2010   Qualifier: Diagnosis of  By: [name], [name]    . Elevated liver enzymes   . GERD (gastroesophageal reflux disease)   . Heart attack ([place])    08/2008 (likely demand ischemia in the setting of MSSA sepsis/R TKA  infection)10-2008  . History of hiatal hernia   . History of  stomach ulcers   . Hypothyroidism   . Lower GI bleeding   . MALAR AND MAXILLARY BONES CLOSED FRACTURE 07/04/2010   Annotation: ORIF Qualifier: Diagnosis of  By: [name], [name]    . Migraine    "definitely"  . MRSA (methicillin resistant Staphylococcus aureus)    in leg, had to place steel rod in leg  . Personal history of colonic adenoma 06/01/2003  . PFO (patent foramen ovale)    small PFO by 08/2008 TEE  . Pneumonia 08/2008   "while in ICU"  . Seizures ([place]) 2009   "long time ago"    . Stroke [facility]) 2010   unable to complete sentences at times    Past Surgical History:  Procedure Laterality Date  . anterior  nerve transposition  07/2009   left ulnar nerve  . antibiotic spacer exchange  11/2008; 08/2006   right knee  . ARTHROTOMY  08/2008   right knee w/I&D  . CARDIOVASCULAR STRESS TEST  2013   stress EKG - negative for ischemia, 4 min  7.3 METs, normal blood pressure response  . CATARACT EXTRACTION    . COLONOSCOPY  11/2012   diverticulosis, rpt 10 yrs [name])  . ESOPHAGOGASTRODUODENOSCOPY  02/2016   erosive gastropathy with duodenal erosions [name])  . HARDWARE REMOVAL  04/2006   right knee w/antibiotic spacers placed  . INGUINAL HERNIA REPAIR  early 1990's   bilateral  . JOINT REPLACEMENT Bilateral   . KNEE ARTHROSCOPY  10/2001   right  . KNEE FUSION  03/2009   right knee removal; antibiotic spacers;   . LEFT HEART CATH AND CORONARY ANGIOGRAPHY N/A 02/04/2018    WNL (Patwardhan, Reynold Bowen, MD)  . REPLACEMENT TOTAL KNEE  08/2008; 09/2001   right  . REVERSE SHOULDER ARTHROPLASTY Left 09/03/2017   Procedure: REVERSE SHOULDER ARTHROPLASTY;  Surgeon: Meredith Pel, MD;  Location: [place];  Service: Orthopedics;  Laterality: Left;  . REVERSE SHOULDER ARTHROPLASTY Right 05/13/2018   Procedure: RIGHT REVERSE SHOULDER ARTHROPLASTY;  Surgeon: Meredith Pel, MD;  Location: [place];  Service: Orthopedics;  Laterality: Right;  . rod placement Right   . SYNOVECTOMY   06/2005   debridement, liner exchange right knee  . TOE AMPUTATION Left 08/2008   great toe - osteomyelitis (staph infection)  . TOE AMPUTATION Left 04/2016   2nd toe [name]  . TONSILLECTOMY    . TOTAL KNEE ARTHROPLASTY Left 01/15/2017  . TOTAL KNEE ARTHROPLASTY Left 01/15/2017   Procedure: TOTAL KNEE ARTHROPLASTY;  Surgeon: Meredith Pel, MD;  Location: [place];  Service: Orthopedics;  Laterality: Left;    MEDICATIONS: . acetaminophen (TYLENOL) 500 MG tablet  . amitriptyline (ELAVIL) 50 MG tablet  . atorvastatin (LIPITOR) 20 MG tablet  . clotrimazole (LOTRIMIN) 1 % cream  . desonide (DESOWEN) 0.05 % cream  . furosemide (LASIX) 20 MG tablet  . gabapentin (NEURONTIN) 300 MG capsule  . ketoconazole (NIZORAL) 2 % shampoo  . levothyroxine (SYNTHROID, LEVOTHROID) 100 MCG tablet  . methocarbamol (ROBAXIN) 500 MG tablet  . metoprolol (TOPROL-XL) 200 MG 24 hr tablet  . nitroGLYCERIN (NITROSTAT) 0.4 MG SL tablet  . pantoprazole (PROTONIX) 40 MG tablet  . SUMAtriptan (IMITREX) 100 MG tablet  . triamcinolone cream (KENALOG) 0.5 %   No current facility-administered medications for this encounter.     Wynonia Musty [facility] Short Stay Center/Anesthesiology Phone [phone] 11/19/2018 10:59 AM

## 2018-11-20 MED ORDER — VANCOMYCIN HCL 10 G IV SOLR
1500.0000 mg | INTRAVENOUS | Status: DC
  Filled 2018-11-20: qty 1500

## 2018-11-20 NOTE — H&P (View-Only) (Signed)
Nicholas [name] is an 68 y.o. male.   Chief Complaint: Back pain and lower extremity radiculopathy HPI: Patient with history of L3-4 and L4-5 stenosis presented to the office for preoperative evaluation for L3-4 and L4-5 decompression..  Progressively worsening symptoms.  Failed conservative treatment.  Past Medical History:  Diagnosis Date  . Acute kidney failure 08/2008   "cleared up"  no problems since  . Allergy   . Anxiety   . Arthritis   . Asthma    ?of this no inhaler  . Blood transfusion   . CHF (congestive heart failure) ([place])    ?of this, pt denies  . CLOSTRIDIUM DIFFICILE COLITIS 07/04/2010   Annotation: 12/09, 2/10 Qualifier: Diagnosis of  By: [name], [name]    . Depression with anxiety   . Duodenitis determined by biopsy 02/2016   peptic likely due to aleve (erosive gastropathy with duodenal erosions)  . ECZEMA 07/04/2010   Qualifier: Diagnosis of  By: [name], [name]    . Elevated liver enzymes   . GERD (gastroesophageal reflux disease)   . Heart attack ([place])    08/2008 (likely demand ischemia in the setting of MSSA sepsis/R TKA  infection)10-2008  . History of hiatal hernia   . History of stomach ulcers   . Hypothyroidism   . Lower GI bleeding   . MALAR AND MAXILLARY BONES CLOSED FRACTURE 07/04/2010   Annotation: ORIF Qualifier: Diagnosis of  By: [name], [name]    . Migraine    "definitely"  . MRSA (methicillin resistant Staphylococcus aureus)    in leg, had to place steel rod in leg  . Personal history of colonic adenoma 06/01/2003  . PFO (patent foramen ovale)    small PFO by 08/2008 TEE  . Pneumonia 08/2008   "while in ICU"  . Seizures ([place]) 2009   "long time ago"    . Stroke [facility]) 2010   unable to complete sentences at times    Past Surgical History:  Procedure Laterality Date  . anterior  nerve transposition  07/2009   left ulnar nerve  . antibiotic spacer exchange  11/2008; 08/2006   right knee  . ARTHROTOMY  08/2008   right knee w/I&D  .  CARDIOVASCULAR STRESS TEST  2013   stress EKG - negative for ischemia, 4 min 7.3 METs, normal blood pressure response  . CATARACT EXTRACTION    . COLONOSCOPY  11/2012   diverticulosis, rpt 10 yrs [name])  . ESOPHAGOGASTRODUODENOSCOPY  02/2016   erosive gastropathy with duodenal erosions [name])  . HARDWARE REMOVAL  04/2006   right knee w/antibiotic spacers placed  . INGUINAL HERNIA REPAIR  early 1990's   bilateral  . JOINT REPLACEMENT Bilateral   . KNEE ARTHROSCOPY  10/2001   right  . KNEE FUSION  03/2009   right knee removal; antibiotic spacers;   . LEFT HEART CATH AND CORONARY ANGIOGRAPHY N/A 02/04/2018    WNL (Patwardhan, Reynold Bowen, MD)  . REPLACEMENT TOTAL KNEE  08/2008; 09/2001   right  . REVERSE SHOULDER ARTHROPLASTY Left 09/03/2017   Procedure: REVERSE SHOULDER ARTHROPLASTY;  Surgeon: Meredith Pel, MD;  Location: [place];  Service: Orthopedics;  Laterality: Left;  . REVERSE SHOULDER ARTHROPLASTY Right 05/13/2018   Procedure: RIGHT REVERSE SHOULDER ARTHROPLASTY;  Surgeon: Meredith Pel, MD;  Location: [place];  Service: Orthopedics;  Laterality: Right;  . rod placement Right   . SYNOVECTOMY  06/2005   debridement, liner exchange right knee  . TOE AMPUTATION Left 08/2008  great toe - osteomyelitis (staph infection)  . TOE AMPUTATION Left 04/2016   2nd toe [name]  . TONSILLECTOMY    . TOTAL KNEE ARTHROPLASTY Left 01/15/2017  . TOTAL KNEE ARTHROPLASTY Left 01/15/2017   Procedure: TOTAL KNEE ARTHROPLASTY;  Surgeon: Meredith Pel, MD;  Location: [place];  Service: Orthopedics;  Laterality: Left;    Family History  Problem Relation Age of Onset  . Coronary artery disease Father 78  . Stroke Father   . CAD Mother 5       stents  . Breast cancer Sister   . Diabetes Brother   . Diabetes Sister   . Colon cancer Neg Hx   . Stomach cancer Neg Hx    Social History:  reports that he has never smoked. He has never used smokeless tobacco. He reports that he does not  drink alcohol or use drugs.  Allergies:  Allergies  Allergen Reactions  . Bee Venom Anaphylaxis and Other (See Comments)    Respiratory Distress  . Peanut-Containing Drug Products Anaphylaxis and Dermatitis  . Celebrex [Celecoxib] Other (See Comments)    BLACK STOOL?MELENA?  [name] Percocet [Oxycodone-Acetaminophen] Hives, Swelling and Other (See Comments)  . Latex Rash  . Nsaids Rash    No medications prior to admission.    Results for orders placed or performed during the hospital encounter of 11/18/18 (from the past 48 hour(s))  Urinalysis, Routine w reflex microscopic     Status: Abnormal   Collection Time: 11/18/18  1:17 PM  Result Value Ref Range   Color, Urine STRAW (A) YELLOW   APPearance CLEAR CLEAR   Specific Gravity, Urine 1.010 1.005 - 1.030   pH 7.0 5.0 - 8.0   Glucose, UA NEGATIVE NEGATIVE mg/dL   Hgb urine dipstick NEGATIVE NEGATIVE   Bilirubin Urine NEGATIVE NEGATIVE   Ketones, ur NEGATIVE NEGATIVE mg/dL   Protein, ur NEGATIVE NEGATIVE mg/dL   Nitrite NEGATIVE NEGATIVE   Leukocytes, UA NEGATIVE NEGATIVE    Comment: Performed at [facility] [address] [place], [place] [address]  Surgical pcr screen     Status: Abnormal   Collection Time: 11/18/18  1:17 PM  Result Value Ref Range   MRSA, PCR POSITIVE (A) NEGATIVE    Comment: RESULT CALLED TO, READ BACK BY AND VERIFIED WITH: Jesse Sans NT 16:05 11/18/18 ([email or web address])    Staphylococcus aureus POSITIVE (A) NEGATIVE    Comment: (NOTE) The Xpert SA Assay (FDA approved for NASAL specimens in patients 83 years of age and older), is one component of a comprehensive surveillance program. It is not intended to diagnose infection nor to guide or monitor treatment. Performed at [facility], [place] [address] [place] [address]   CBC     Status: None   Collection Time: 11/18/18  1:18 PM  Result Value Ref Range   WBC 9.9 4.0 - 10.5 K/uL   RBC 4.97 4.22 - 5.81 MIL/uL   Hemoglobin 14.0 13.0 - 17.0  g/dL   HCT 44.6 39.0 - 52.0 %   MCV 89.7 80.0 - 100.0 fL   MCH 28.2 26.0 - 34.0 pg   MCHC 31.4 30.0 - 36.0 g/dL   RDW 15.4 11.5 - 15.5 %   Platelets 236 150 - 400 K/uL   nRBC 0.0 0.0 - 0.2 %    Comment: Performed at [facility], [place] [address] [place], [place] [address]  Comprehensive metabolic panel     Status: Abnormal   Collection  Time: 11/18/18  1:18 PM  Result Value Ref Range   Sodium 137 135 - 145 mmol/L   Potassium 4.1 3.5 - 5.1 mmol/L   Chloride 105 98 - 111 mmol/L   CO2 22 22 - 32 mmol/L   Glucose, Bld 135 (H) 70 - 99 mg/dL   BUN 20 8 - 23 mg/dL   Creatinine, Ser 1.33 (H) 0.61 - 1.24 mg/dL   Calcium 8.7 (L) 8.9 - 10.3 mg/dL   Total Protein 7.5 6.5 - 8.1 g/dL   Albumin 3.7 3.5 - 5.0 g/dL   AST 27 15 - 41 U/L   ALT 22 0 - 44 U/L   Alkaline Phosphatase 93 38 - 126 U/L   Total Bilirubin 1.2 0.3 - 1.2 mg/dL   GFR calc non Af Amer 55 (L) >60 mL/min   GFR calc Af Amer >60 >60 mL/min   Anion gap 10 5 - 15    Comment: Performed at [facility], [address], [place], [place] [address]   No results found.  Review of Systems  Constitutional: Negative.   HENT: Negative.   Respiratory: Negative.   Cardiovascular: Negative.   Genitourinary: Negative.   Musculoskeletal: Positive for back pain.  Skin: Negative.   Neurological: Positive for tingling.  Psychiatric/Behavioral: Negative.     There were no vitals taken for this visit. Physical Exam  Constitutional: He is oriented to person, place, and time. No distress.  HENT:  Head: Normocephalic and atraumatic.  Eyes: Pupils are equal, round, and reactive to light. EOM are normal.  Neck: Normal range of motion.  Cardiovascular: Normal rate.  Respiratory: Effort normal and breath sounds normal. No respiratory distress.  GI: Soft. Bowel sounds are normal. He exhibits no distension. There is no abdominal tenderness.  Musculoskeletal:        General: Tenderness present.  Neurological: He is alert and  oriented to person, place, and time.  Skin: Skin is warm and dry.  Psychiatric: He has a normal mood and affect.     Assessment/Plan L3-4 and L4-5 stenosis  We will proceed with L3-4 and L4-5 decompression as scheduled.  Surgery procedure along with potential rehab/recovery time discussed.  All questions answered and wishes to proceed.  Benjiman Core, PA-C 11/20/2018, 10:44 AM

## 2018-11-20 NOTE — H&P (Signed)
Nicholas [name] is an 68 y.o. male.   Chief Complaint: Back pain and lower extremity radiculopathy HPI: Patient with history of L3-4 and L4-5 stenosis presented to the office for preoperative evaluation for L3-4 and L4-5 decompression..  Progressively worsening symptoms.  Failed conservative treatment.  Past Medical History:  Diagnosis Date  . Acute kidney failure 08/2008   "cleared up"  no problems since  . Allergy   . Anxiety   . Arthritis   . Asthma    ?of this no inhaler  . Blood transfusion   . CHF (congestive heart failure) ([place])    ?of this, pt denies  . CLOSTRIDIUM DIFFICILE COLITIS 07/04/2010   Annotation: 12/09, 2/10 Qualifier: Diagnosis of  By: [name], [name]    . Depression with anxiety   . Duodenitis determined by biopsy 02/2016   peptic likely due to aleve (erosive gastropathy with duodenal erosions)  . ECZEMA 07/04/2010   Qualifier: Diagnosis of  By: [name], [name]    . Elevated liver enzymes   . GERD (gastroesophageal reflux disease)   . Heart attack ([place])    08/2008 (likely demand ischemia in the setting of MSSA sepsis/R TKA  infection)10-2008  . History of hiatal hernia   . History of stomach ulcers   . Hypothyroidism   . Lower GI bleeding   . MALAR AND MAXILLARY BONES CLOSED FRACTURE 07/04/2010   Annotation: ORIF Qualifier: Diagnosis of  By: [name], [name]    . Migraine    "definitely"  . MRSA (methicillin resistant Staphylococcus aureus)    in leg, had to place steel rod in leg  . Personal history of colonic adenoma 06/01/2003  . PFO (patent foramen ovale)    small PFO by 08/2008 TEE  . Pneumonia 08/2008   "while in ICU"  . Seizures ([place]) 2009   "long time ago"    . Stroke [facility]) 2010   unable to complete sentences at times    Past Surgical History:  Procedure Laterality Date  . anterior  nerve transposition  07/2009   left ulnar nerve  . antibiotic spacer exchange  11/2008; 08/2006   right knee  . ARTHROTOMY  08/2008   right knee w/I&D  .  CARDIOVASCULAR STRESS TEST  2013   stress EKG - negative for ischemia, 4 min 7.3 METs, normal blood pressure response  . CATARACT EXTRACTION    . COLONOSCOPY  11/2012   diverticulosis, rpt 10 yrs [name])  . ESOPHAGOGASTRODUODENOSCOPY  02/2016   erosive gastropathy with duodenal erosions [name])  . HARDWARE REMOVAL  04/2006   right knee w/antibiotic spacers placed  . INGUINAL HERNIA REPAIR  early 1990's   bilateral  . JOINT REPLACEMENT Bilateral   . KNEE ARTHROSCOPY  10/2001   right  . KNEE FUSION  03/2009   right knee removal; antibiotic spacers;   . LEFT HEART CATH AND CORONARY ANGIOGRAPHY N/A 02/04/2018    WNL (Patwardhan, Reynold Bowen, MD)  . REPLACEMENT TOTAL KNEE  08/2008; 09/2001   right  . REVERSE SHOULDER ARTHROPLASTY Left 09/03/2017   Procedure: REVERSE SHOULDER ARTHROPLASTY;  Surgeon: Meredith Pel, MD;  Location: [place];  Service: Orthopedics;  Laterality: Left;  . REVERSE SHOULDER ARTHROPLASTY Right 05/13/2018   Procedure: RIGHT REVERSE SHOULDER ARTHROPLASTY;  Surgeon: Meredith Pel, MD;  Location: [place];  Service: Orthopedics;  Laterality: Right;  . rod placement Right   . SYNOVECTOMY  06/2005   debridement, liner exchange right knee  . TOE AMPUTATION Left 08/2008  great toe - osteomyelitis (staph infection)  . TOE AMPUTATION Left 04/2016   2nd toe [name]  . TONSILLECTOMY    . TOTAL KNEE ARTHROPLASTY Left 01/15/2017  . TOTAL KNEE ARTHROPLASTY Left 01/15/2017   Procedure: TOTAL KNEE ARTHROPLASTY;  Surgeon: Meredith Pel, MD;  Location: [place];  Service: Orthopedics;  Laterality: Left;    Family History  Problem Relation Age of Onset  . Coronary artery disease Father 52  . Stroke Father   . CAD Mother 83       stents  . Breast cancer Sister   . Diabetes Brother   . Diabetes Sister   . Colon cancer Neg Hx   . Stomach cancer Neg Hx    Social History:  reports that he has never smoked. He has never used smokeless tobacco. He reports that he does not  drink alcohol or use drugs.  Allergies:  Allergies  Allergen Reactions  . Bee Venom Anaphylaxis and Other (See Comments)    Respiratory Distress  . Peanut-Containing Drug Products Anaphylaxis and Dermatitis  . Celebrex [Celecoxib] Other (See Comments)    BLACK STOOL?MELENA?  [name] Percocet [Oxycodone-Acetaminophen] Hives, Swelling and Other (See Comments)  . Latex Rash  . Nsaids Rash    No medications prior to admission.    Results for orders placed or performed during the hospital encounter of 11/18/18 (from the past 48 hour(s))  Urinalysis, Routine w reflex microscopic     Status: Abnormal   Collection Time: 11/18/18  1:17 PM  Result Value Ref Range   Color, Urine STRAW (A) YELLOW   APPearance CLEAR CLEAR   Specific Gravity, Urine 1.010 1.005 - 1.030   pH 7.0 5.0 - 8.0   Glucose, UA NEGATIVE NEGATIVE mg/dL   Hgb urine dipstick NEGATIVE NEGATIVE   Bilirubin Urine NEGATIVE NEGATIVE   Ketones, ur NEGATIVE NEGATIVE mg/dL   Protein, ur NEGATIVE NEGATIVE mg/dL   Nitrite NEGATIVE NEGATIVE   Leukocytes, UA NEGATIVE NEGATIVE    Comment: Performed at [facility] [address], [place], [place] [address]  Surgical pcr screen     Status: Abnormal   Collection Time: 11/18/18  1:17 PM  Result Value Ref Range   MRSA, PCR POSITIVE (A) NEGATIVE    Comment: RESULT CALLED TO, READ BACK BY AND VERIFIED WITH: Jesse Sans NT 16:05 11/18/18 ([email or web address])    Staphylococcus aureus POSITIVE (A) NEGATIVE    Comment: (NOTE) The Xpert SA Assay (FDA approved for NASAL specimens in patients 36 years of age and older), is one component of a comprehensive surveillance program. It is not intended to diagnose infection nor to guide or monitor treatment. Performed at [facility], [place] [address], [place] [address]   CBC     Status: None   Collection Time: 11/18/18  1:18 PM  Result Value Ref Range   WBC 9.9 4.0 - 10.5 K/uL   RBC 4.97 4.22 - 5.81 MIL/uL   Hemoglobin 14.0 13.0 - 17.0  g/dL   HCT 44.6 39.0 - 52.0 %   MCV 89.7 80.0 - 100.0 fL   MCH 28.2 26.0 - 34.0 pg   MCHC 31.4 30.0 - 36.0 g/dL   RDW 15.4 11.5 - 15.5 %   Platelets 236 150 - 400 K/uL   nRBC 0.0 0.0 - 0.2 %    Comment: Performed at [facility], [place] [address] [place], [place] [address]  Comprehensive metabolic panel     Status: Abnormal   Collection  Time: 11/18/18  1:18 PM  Result Value Ref Range   Sodium 137 135 - 145 mmol/L   Potassium 4.1 3.5 - 5.1 mmol/L   Chloride 105 98 - 111 mmol/L   CO2 22 22 - 32 mmol/L   Glucose, Bld 135 (H) 70 - 99 mg/dL   BUN 20 8 - 23 mg/dL   Creatinine, Ser 1.33 (H) 0.61 - 1.24 mg/dL   Calcium 8.7 (L) 8.9 - 10.3 mg/dL   Total Protein 7.5 6.5 - 8.1 g/dL   Albumin 3.7 3.5 - 5.0 g/dL   AST 27 15 - 41 U/L   ALT 22 0 - 44 U/L   Alkaline Phosphatase 93 38 - 126 U/L   Total Bilirubin 1.2 0.3 - 1.2 mg/dL   GFR calc non Af Amer 55 (L) >60 mL/min   GFR calc Af Amer >60 >60 mL/min   Anion gap 10 5 - 15    Comment: Performed at [facility], [address], [place], [place] [address]   No results found.  Review of Systems  Constitutional: Negative.   HENT: Negative.   Respiratory: Negative.   Cardiovascular: Negative.   Genitourinary: Negative.   Musculoskeletal: Positive for back pain.  Skin: Negative.   Neurological: Positive for tingling.  Psychiatric/Behavioral: Negative.     There were no vitals taken for this visit. Physical Exam  Constitutional: He is oriented to person, place, and time. No distress.  HENT:  Head: Normocephalic and atraumatic.  Eyes: Pupils are equal, round, and reactive to light. EOM are normal.  Neck: Normal range of motion.  Cardiovascular: Normal rate.  Respiratory: Effort normal and breath sounds normal. No respiratory distress.  GI: Soft. Bowel sounds are normal. He exhibits no distension. There is no abdominal tenderness.  Musculoskeletal:        General: Tenderness present.  Neurological: He is alert and  oriented to person, place, and time.  Skin: Skin is warm and dry.  Psychiatric: He has a normal mood and affect.     Assessment/Plan L3-4 and L4-5 stenosis  We will proceed with L3-4 and L4-5 decompression as scheduled.  Surgery procedure along with potential rehab/recovery time discussed.  All questions answered and wishes to proceed.  Benjiman Core, PA-C 11/20/2018, 10:44 AM

## 2018-11-21 ENCOUNTER — Encounter (HOSPITAL_COMMUNITY): Admission: RE | Payer: Self-pay | Source: Ambulatory Visit

## 2018-11-21 ENCOUNTER — Ambulatory Visit (HOSPITAL_COMMUNITY): Admission: RE | Admit: 2018-11-21 | Payer: PPO | Source: Ambulatory Visit | Admitting: Orthopaedic Surgery

## 2018-11-21 ENCOUNTER — Telehealth (INDEPENDENT_AMBULATORY_CARE_PROVIDER_SITE_OTHER): Payer: Self-pay | Admitting: Orthopaedic Surgery

## 2018-11-21 SURGERY — Surgical Case
Anesthesia: *Unknown

## 2018-11-21 SURGERY — LUMBAR LAMINECTOMY/DECOMPRESSION MICRODISCECTOMY
Anesthesia: Choice

## 2018-11-21 MED ORDER — TRAMADOL HCL 50 MG PO TABS: 50.0000 mg | ORAL_TABLET | Freq: Three times a day (TID) | ORAL | 0 refills | Status: DC | PRN

## 2018-11-21 NOTE — Telephone Encounter (Signed)
Called to pharmacy. I called patient and advised. 

## 2018-11-21 NOTE — Telephone Encounter (Signed)
Patient states he is in bed unable to move and in unbearable pain.  Wants to know if doctor can prescribe anything that will help him.    [phone]

## 2018-11-21 NOTE — Telephone Encounter (Signed)
Per Dr. [name], ok for Ultram 50mg  1 po q 8 hours prn #30 no refills.

## 2018-11-25 ENCOUNTER — Telehealth: Payer: Self-pay

## 2018-11-25 NOTE — Telephone Encounter (Signed)
Pt wanted to let Dr Danise Mina know that his back surgery was postponed due to the surgeon being sick and pt is waiting for the surgery to be rescheduled. FYI to Dr Danise Mina.

## 2018-11-26 NOTE — Telephone Encounter (Signed)
Noted. Thanks.

## 2018-11-28 ENCOUNTER — Encounter: Payer: Self-pay | Admitting: Family Medicine

## 2018-12-02 ENCOUNTER — Inpatient Hospital Stay (INDEPENDENT_AMBULATORY_CARE_PROVIDER_SITE_OTHER): Payer: PPO | Admitting: Orthopaedic Surgery

## 2018-12-04 ENCOUNTER — Other Ambulatory Visit: Payer: Self-pay

## 2018-12-04 ENCOUNTER — Encounter (HOSPITAL_COMMUNITY): Payer: Self-pay | Admitting: *Deleted

## 2018-12-04 MED ORDER — VANCOMYCIN HCL 10 G IV SOLR
1500.0000 mg | INTRAVENOUS | Status: AC
  Administered 2018-12-05 (×2): 1500 mg via INTRAVENOUS
  Filled 2018-12-04: qty 1500

## 2018-12-04 MED ORDER — DEXTROSE 5 % IV SOLN
3.0000 g | INTRAVENOUS | Status: AC
  Administered 2018-12-05: 3 g via INTRAVENOUS
  Filled 2018-12-04: qty 3
  Filled 2018-12-04: qty 3000

## 2018-12-04 NOTE — Progress Notes (Signed)
Pt denies SOB and chest pain. Pt stated that he is under the care of Dr. Einar Gip, Cardiology. Pt stated that all of his information remains the same since PAT visit except he now takes Tramadol. Pt stated that he still has his instructions from PAT appointment.  Pt made aware that he can take Tramadol on DOS if needed but hold Tylenol since he takes it in the afternoon and evening. Pt verbalized understanding of all pre-op instructions.

## 2018-12-04 NOTE — Anesthesia Preprocedure Evaluation (Addendum)
Anesthesia Evaluation  Patient identified by MRN, date of birth, ID band Patient awake    Reviewed: Allergy & Precautions, NPO status , Patient's Chart, lab work & pertinent test results  History of Anesthesia Complications Negative for: history of anesthetic complications  Airway Mallampati: II  TM Distance: >3 FB Neck ROM: Full    Dental  (+) Dental Advisory Given, Edentulous Upper, Edentulous Lower   Pulmonary asthma , sleep apnea ,    Pulmonary exam normal breath sounds clear to auscultation       Cardiovascular + Past MI (NSTEMI 2009 in setting of sepsis)  Normal cardiovascular exam Rhythm:Regular Rate:Normal  TTE 2019: EF 55%, mild AR, MR, TR   Neuro/Psych  Headaches, Seizures - (last seizure in 20s), Well Controlled,  Anxiety Depression CVA (2010), No Residual Symptoms    GI/Hepatic Neg liver ROS, hiatal hernia, GERD  ,  Endo/Other  Hypothyroidism   Renal/GU Renal InsufficiencyRenal disease     Musculoskeletal  (+) Arthritis ,   Abdominal   Peds  Hematology negative hematology ROS (+)   Anesthesia Other Findings Day of surgery medications reviewed with the patient.  Reproductive/Obstetrics                            Anesthesia Physical Anesthesia Plan  ASA: II  Anesthesia Plan: General   Post-op Pain Management:    Induction: Intravenous  PONV Risk Score and Plan: 2 and Treatment may vary due to age or medical condition, Ondansetron and Dexamethasone  Airway Management Planned: Oral ETT  Additional Equipment:   Intra-op Plan:   Post-operative Plan: Extubation in OR  Informed Consent: I have reviewed the patients History and Physical, chart, labs and discussed the procedure including the risks, benefits and alternatives for the proposed anesthesia with the patient or authorized representative who has indicated his/her understanding and acceptance.     Dental advisory  given  Plan Discussed with: CRNA  Anesthesia Plan Comments:        Anesthesia Quick Evaluation

## 2018-12-05 ENCOUNTER — Encounter (HOSPITAL_COMMUNITY)
Admission: RE | Disposition: A | Discharge status: Home / Self Care | Payer: Self-pay | Source: Ambulatory Visit | Attending: Orthopaedic Surgery

## 2018-12-05 ENCOUNTER — Observation Stay (HOSPITAL_COMMUNITY)
Admission: RE | Admit: 2018-12-05 | Discharge: 2018-12-06 | Disposition: A | Discharge status: Home / Self Care | Payer: PPO | Source: Ambulatory Visit | Attending: Orthopaedic Surgery | Admitting: Orthopaedic Surgery

## 2018-12-05 ENCOUNTER — Ambulatory Visit (HOSPITAL_COMMUNITY): Payer: PPO | Admitting: Anesthesiology

## 2018-12-05 ENCOUNTER — Encounter (HOSPITAL_COMMUNITY): Payer: Self-pay

## 2018-12-05 ENCOUNTER — Ambulatory Visit (HOSPITAL_COMMUNITY): Payer: PPO

## 2018-12-05 DIAGNOSIS — Z8614 Personal history of Methicillin resistant Staphylococcus aureus infection: Secondary | ICD-10-CM | POA: Insufficient documentation

## 2018-12-05 DIAGNOSIS — G4730 Sleep apnea, unspecified: Secondary | ICD-10-CM | POA: Insufficient documentation

## 2018-12-05 DIAGNOSIS — M48062 Spinal stenosis, lumbar region with neurogenic claudication: Secondary | ICD-10-CM

## 2018-12-05 DIAGNOSIS — M48061 Spinal stenosis, lumbar region without neurogenic claudication: Principal | ICD-10-CM | POA: Insufficient documentation

## 2018-12-05 DIAGNOSIS — I509 Heart failure, unspecified: Secondary | ICD-10-CM | POA: Diagnosis not present

## 2018-12-05 DIAGNOSIS — Z96612 Presence of left artificial shoulder joint: Secondary | ICD-10-CM | POA: Insufficient documentation

## 2018-12-05 DIAGNOSIS — G4733 Obstructive sleep apnea (adult) (pediatric): Secondary | ICD-10-CM | POA: Diagnosis not present

## 2018-12-05 DIAGNOSIS — Z89412 Acquired absence of left great toe: Secondary | ICD-10-CM | POA: Diagnosis not present

## 2018-12-05 DIAGNOSIS — Z96653 Presence of artificial knee joint, bilateral: Secondary | ICD-10-CM | POA: Insufficient documentation

## 2018-12-05 DIAGNOSIS — Z8673 Personal history of transient ischemic attack (TIA), and cerebral infarction without residual deficits: Secondary | ICD-10-CM | POA: Insufficient documentation

## 2018-12-05 DIAGNOSIS — Z981 Arthrodesis status: Secondary | ICD-10-CM | POA: Diagnosis not present

## 2018-12-05 DIAGNOSIS — I252 Old myocardial infarction: Secondary | ICD-10-CM | POA: Diagnosis not present

## 2018-12-05 DIAGNOSIS — Z419 Encounter for procedure for purposes other than remedying health state, unspecified: Secondary | ICD-10-CM

## 2018-12-05 HISTORY — DX: Presence of spectacles and contact lenses: Z97.3

## 2018-12-05 HISTORY — DX: Complete loss of teeth, unspecified cause, unspecified class: K08.109

## 2018-12-05 HISTORY — PX: LUMBAR LAMINECTOMY/DECOMPRESSION MICRODISCECTOMY: SHX5026

## 2018-12-05 HISTORY — DX: Complete loss of teeth, unspecified cause, unspecified class: Z97.2

## 2018-12-05 HISTORY — DX: Sleep apnea, unspecified: G47.30

## 2018-12-05 LAB — CBC
HEMATOCRIT: 43.7 % (ref 39.0–52.0)
HEMOGLOBIN: 13.4 g/dL (ref 13.0–17.0)
MCH: 27.2 pg (ref 26.0–34.0)
MCHC: 30.7 g/dL (ref 30.0–36.0)
MCV: 88.6 fL (ref 80.0–100.0)
Platelets: 245 10*3/uL (ref 150–400)
RBC: 4.93 MIL/uL (ref 4.22–5.81)
RDW: 14.9 % (ref 11.5–15.5)
WBC: 9.3 10*3/uL (ref 4.0–10.5)
nRBC: 0 % (ref 0.0–0.2)

## 2018-12-05 LAB — BASIC METABOLIC PANEL
Anion gap: 10 (ref 5–15)
BUN: 21 mg/dL (ref 8–23)
CO2: 25 mmol/L (ref 22–32)
Calcium: 8.5 mg/dL — ABNORMAL LOW (ref 8.9–10.3)
Chloride: 101 mmol/L (ref 98–111)
Creatinine, Ser: 1.37 mg/dL — ABNORMAL HIGH (ref 0.61–1.24)
GFR calc Af Amer: >60 mL/min (ref >60)
GFR calc non Af Amer: 53 mL/min — ABNORMAL LOW (ref >60)
GLUCOSE: 117 mg/dL — AB (ref 70–99)
POTASSIUM: 3.8 mmol/L (ref 3.5–5.1)
Sodium: 136 mmol/L (ref 135–145)

## 2018-12-05 SURGERY — LUMBAR LAMINECTOMY/DECOMPRESSION MICRODISCECTOMY
Anesthesia: General | Site: Back

## 2018-12-05 MED ORDER — CEFAZOLIN SODIUM-DEXTROSE 1-4 GM/50ML-% IV SOLN
1.0000 g | Freq: Three times a day (TID) | INTRAVENOUS | Status: AC
  Administered 2018-12-05 (×2): 1 g via INTRAVENOUS
  Filled 2018-12-05 (×4): qty 50

## 2018-12-05 MED ORDER — TAPENTADOL HCL 75 MG PO TABS: 75.0000 mg | ORAL_TABLET | Freq: Four times a day (QID) | ORAL | 0 refills | Status: DC | PRN

## 2018-12-05 MED ORDER — HYDROMORPHONE HCL 1 MG/ML IJ SOLN: 0.5000 mg | INTRAMUSCULAR | Status: DC | PRN

## 2018-12-05 MED ORDER — SODIUM CHLORIDE 0.9 % IV SOLN: INTRAVENOUS | Status: DC

## 2018-12-05 MED ORDER — CLOTRIMAZOLE 1 % EX CREA
1.0000 "application " | TOPICAL_CREAM | Freq: Three times a day (TID) | CUTANEOUS | Status: DC
  Administered 2018-12-05: 1 via TOPICAL
  Filled 2018-12-05: qty 15

## 2018-12-05 MED ORDER — METOPROLOL SUCCINATE ER 100 MG PO TB24: 200.0000 mg | ORAL_TABLET | Freq: Every day | ORAL | Status: DC

## 2018-12-05 MED ORDER — FENTANYL CITRATE (PF) 250 MCG/5ML IJ SOLN
INTRAMUSCULAR | Status: AC
  Filled 2018-12-05: qty 5

## 2018-12-05 MED ORDER — FENTANYL CITRATE (PF) 100 MCG/2ML IJ SOLN
INTRAMUSCULAR | Status: DC | PRN
  Administered 2018-12-05: 100 ug via INTRAVENOUS
  Administered 2018-12-05: 50 ug via INTRAVENOUS
  Administered 2018-12-05: 25 ug via INTRAVENOUS

## 2018-12-05 MED ORDER — TAPENTADOL HCL 50 MG PO TABS
75.0000 mg | ORAL_TABLET | ORAL | Status: DC | PRN
  Administered 2018-12-05 – 2018-12-06 (×3): 75 mg via ORAL
  Filled 2018-12-05 (×3): qty 2

## 2018-12-05 MED ORDER — EPHEDRINE SULFATE-NACL 50-0.9 MG/10ML-% IV SOSY
PREFILLED_SYRINGE | INTRAVENOUS | Status: DC | PRN
  Administered 2018-12-05: 15 mg via INTRAVENOUS
  Administered 2018-12-05: 10 mg via INTRAVENOUS
  Administered 2018-12-05: 15 mg via INTRAVENOUS
  Administered 2018-12-05: 10 mg via INTRAVENOUS

## 2018-12-05 MED ORDER — STERILE WATER FOR IRRIGATION IR SOLN
Status: DC | PRN
  Administered 2018-12-05: 1000 mL

## 2018-12-05 MED ORDER — ROCURONIUM BROMIDE 50 MG/5ML IV SOSY
PREFILLED_SYRINGE | INTRAVENOUS | Status: DC | PRN
  Administered 2018-12-05: 10 mg via INTRAVENOUS
  Administered 2018-12-05: 30 mg via INTRAVENOUS

## 2018-12-05 MED ORDER — PROPOFOL 10 MG/ML IV BOLUS
INTRAVENOUS | Status: AC
  Filled 2018-12-05: qty 20

## 2018-12-05 MED ORDER — BUPIVACAINE HCL (PF) 0.25 % IJ SOLN
INTRAMUSCULAR | Status: AC
  Filled 2018-12-05: qty 30

## 2018-12-05 MED ORDER — NITROGLYCERIN 0.4 MG SL SUBL: 0.4000 mg | SUBLINGUAL_TABLET | SUBLINGUAL | Status: DC | PRN

## 2018-12-05 MED ORDER — AMITRIPTYLINE HCL 100 MG PO TABS
100.0000 mg | ORAL_TABLET | Freq: Every day | ORAL | Status: DC
  Administered 2018-12-05: 100 mg via ORAL
  Filled 2018-12-05 (×3): qty 1

## 2018-12-05 MED ORDER — PROMETHAZINE HCL 25 MG/ML IJ SOLN: 6.2500 mg | INTRAMUSCULAR | Status: DC | PRN

## 2018-12-05 MED ORDER — SODIUM CHLORIDE 0.9 % IV SOLN
INTRAVENOUS | Status: DC | PRN
  Administered 2018-12-05: 20 ug/min via INTRAVENOUS

## 2018-12-05 MED ORDER — GABAPENTIN 300 MG PO CAPS
600.0000 mg | ORAL_CAPSULE | Freq: Every day | ORAL | Status: DC
  Administered 2018-12-05: 600 mg via ORAL
  Filled 2018-12-05: qty 2

## 2018-12-05 MED ORDER — ATORVASTATIN CALCIUM 10 MG PO TABS: 20.0000 mg | ORAL_TABLET | Freq: Every day | ORAL | Status: DC

## 2018-12-05 MED ORDER — KETOCONAZOLE 2 % EX SHAM
1.0000 "application " | MEDICATED_SHAMPOO | CUTANEOUS | Status: DC
  Filled 2018-12-05: qty 120

## 2018-12-05 MED ORDER — LEVOTHYROXINE SODIUM 100 MCG PO TABS
100.0000 ug | ORAL_TABLET | Freq: Every day | ORAL | Status: DC
  Administered 2018-12-06: 100 ug via ORAL
  Filled 2018-12-05: qty 1

## 2018-12-05 MED ORDER — ONDANSETRON HCL 4 MG/2ML IJ SOLN
INTRAMUSCULAR | Status: AC
  Filled 2018-12-05: qty 2

## 2018-12-05 MED ORDER — LIDOCAINE 2% (20 MG/ML) 5 ML SYRINGE
INTRAMUSCULAR | Status: DC | PRN
  Administered 2018-12-05: 100 mg via INTRAVENOUS

## 2018-12-05 MED ORDER — SODIUM CHLORIDE 0.9% FLUSH
3.0000 mL | Freq: Two times a day (BID) | INTRAVENOUS | Status: DC
  Administered 2018-12-05: 3 mL via INTRAVENOUS

## 2018-12-05 MED ORDER — PHENYLEPHRINE 40 MCG/ML (10ML) SYRINGE FOR IV PUSH (FOR BLOOD PRESSURE SUPPORT)
PREFILLED_SYRINGE | INTRAVENOUS | Status: DC | PRN
  Administered 2018-12-05 (×3): 80 ug via INTRAVENOUS

## 2018-12-05 MED ORDER — PHENOL 1.4 % MT LIQD: 1.0000 | OROMUCOSAL | Status: DC | PRN

## 2018-12-05 MED ORDER — GLYCOPYRROLATE PF 0.2 MG/ML IJ SOSY
PREFILLED_SYRINGE | INTRAMUSCULAR | Status: DC | PRN
  Administered 2018-12-05 (×2): .1 mg via INTRAVENOUS

## 2018-12-05 MED ORDER — SUCCINYLCHOLINE CHLORIDE 200 MG/10ML IV SOSY
PREFILLED_SYRINGE | INTRAVENOUS | Status: DC | PRN
  Administered 2018-12-05: 160 mg via INTRAVENOUS

## 2018-12-05 MED ORDER — EPHEDRINE 5 MG/ML INJ
INTRAVENOUS | Status: AC
  Filled 2018-12-05: qty 10

## 2018-12-05 MED ORDER — BUPIVACAINE HCL (PF) 0.25 % IJ SOLN
INTRAMUSCULAR | Status: DC | PRN
  Administered 2018-12-05: 10 mL

## 2018-12-05 MED ORDER — SODIUM CHLORIDE 0.9% FLUSH: 3.0000 mL | INTRAVENOUS | Status: DC | PRN

## 2018-12-05 MED ORDER — ONDANSETRON HCL 4 MG PO TABS: 4.0000 mg | ORAL_TABLET | Freq: Four times a day (QID) | ORAL | Status: DC | PRN

## 2018-12-05 MED ORDER — PHENYLEPHRINE 40 MCG/ML (10ML) SYRINGE FOR IV PUSH (FOR BLOOD PRESSURE SUPPORT)
PREFILLED_SYRINGE | INTRAVENOUS | Status: AC
  Filled 2018-12-05: qty 10

## 2018-12-05 MED ORDER — SUGAMMADEX SODIUM 200 MG/2ML IV SOLN
INTRAVENOUS | Status: DC | PRN
  Administered 2018-12-05: 200 mg via INTRAVENOUS

## 2018-12-05 MED ORDER — 0.9 % SODIUM CHLORIDE (POUR BTL) OPTIME
TOPICAL | Status: DC | PRN
  Administered 2018-12-05: 1000 mL

## 2018-12-05 MED ORDER — ROCURONIUM BROMIDE 50 MG/5ML IV SOSY
PREFILLED_SYRINGE | INTRAVENOUS | Status: AC
  Filled 2018-12-05: qty 5

## 2018-12-05 MED ORDER — ONDANSETRON HCL 4 MG/2ML IJ SOLN: 4.0000 mg | Freq: Four times a day (QID) | INTRAMUSCULAR | Status: DC | PRN

## 2018-12-05 MED ORDER — DEXAMETHASONE SODIUM PHOSPHATE 10 MG/ML IJ SOLN
INTRAMUSCULAR | Status: AC
  Filled 2018-12-05: qty 1

## 2018-12-05 MED ORDER — PANTOPRAZOLE SODIUM 40 MG PO TBEC: 40.0000 mg | DELAYED_RELEASE_TABLET | Freq: Every day | ORAL | Status: DC

## 2018-12-05 MED ORDER — HEMOSTATIC AGENTS (NO CHARGE) OPTIME
TOPICAL | Status: DC | PRN
  Administered 2018-12-05: 1 via TOPICAL

## 2018-12-05 MED ORDER — LACTATED RINGERS IV SOLN
INTRAVENOUS | Status: DC
  Administered 2018-12-05 (×2): via INTRAVENOUS

## 2018-12-05 MED ORDER — DEXAMETHASONE SODIUM PHOSPHATE 10 MG/ML IJ SOLN
INTRAMUSCULAR | Status: DC | PRN
  Administered 2018-12-05: 10 mg via INTRAVENOUS

## 2018-12-05 MED ORDER — ONDANSETRON HCL 4 MG/2ML IJ SOLN
INTRAMUSCULAR | Status: DC | PRN
  Administered 2018-12-05 (×2): 4 mg via INTRAVENOUS

## 2018-12-05 MED ORDER — MENTHOL 3 MG MT LOZG: 1.0000 | LOZENGE | OROMUCOSAL | Status: DC | PRN

## 2018-12-05 MED ORDER — HYDROMORPHONE HCL 1 MG/ML IJ SOLN: 0.2500 mg | INTRAMUSCULAR | Status: DC | PRN

## 2018-12-05 MED ORDER — LIDOCAINE 2% (20 MG/ML) 5 ML SYRINGE
INTRAMUSCULAR | Status: AC
  Filled 2018-12-05: qty 5

## 2018-12-05 MED ORDER — GABAPENTIN 300 MG PO CAPS
300.0000 mg | ORAL_CAPSULE | Freq: Two times a day (BID) | ORAL | Status: DC
  Administered 2018-12-05: 300 mg via ORAL
  Filled 2018-12-05: qty 1

## 2018-12-05 MED ORDER — CHLORHEXIDINE GLUCONATE 4 % EX LIQD: 60.0000 mL | Freq: Once | CUTANEOUS | Status: DC

## 2018-12-05 MED ORDER — POLYETHYLENE GLYCOL 3350 17 G PO PACK: 17.0000 g | PACK | Freq: Every day | ORAL | Status: DC | PRN

## 2018-12-05 MED ORDER — SODIUM CHLORIDE 0.9 % IV SOLN: 250.0000 mL | INTRAVENOUS | Status: DC

## 2018-12-05 MED ORDER — PROPOFOL 10 MG/ML IV BOLUS
INTRAVENOUS | Status: DC | PRN
  Administered 2018-12-05: 200 mg via INTRAVENOUS

## 2018-12-05 MED ORDER — ACETAMINOPHEN 10 MG/ML IV SOLN: 1000.0000 mg | Freq: Once | INTRAVENOUS | Status: DC | PRN

## 2018-12-05 MED ORDER — FUROSEMIDE 20 MG PO TABS: 20.0000 mg | ORAL_TABLET | Freq: Every day | ORAL | Status: DC

## 2018-12-05 MED ORDER — SUMATRIPTAN SUCCINATE 100 MG PO TABS
100.0000 mg | ORAL_TABLET | Freq: Every day | ORAL | Status: DC | PRN
  Filled 2018-12-05: qty 1

## 2018-12-05 SURGICAL SUPPLY — 53 items
AGENT HMST KT MTR STRL THRMB (HEMOSTASIS) ×1
BUR ROUND FLUTED 4 SOFT TCH (BURR) ×1 IMPLANT
BUR ROUND FLUTED 4MM SOFT TCH (BURR) ×1
CANISTER SUCT 3000ML PPV (MISCELLANEOUS) ×3 IMPLANT
CLOSURE STERI-STRIP 1/2X4 (GAUZE/BANDAGES/DRESSINGS) ×1
CLSR STERI-STRIP ANTIMIC 1/2X4 (GAUZE/BANDAGES/DRESSINGS) ×2 IMPLANT
COVER SURGICAL LIGHT HANDLE (MISCELLANEOUS) ×3 IMPLANT
COVER WAND RF STERILE (DRAPES) ×3 IMPLANT
DECANTER SPIKE VIAL GLASS SM (MISCELLANEOUS) ×3 IMPLANT
DRAPE HALF SHEET 40X57 (DRAPES) ×4 IMPLANT
DRAPE MICROSCOPE LEICA (MISCELLANEOUS) ×3 IMPLANT
DRAPE SURG 17X23 STRL (DRAPES) ×3 IMPLANT
DRSG MEPILEX BORDER 4X4 (GAUZE/BANDAGES/DRESSINGS) ×3 IMPLANT
DRSG XEROFORM 1X8 (GAUZE/BANDAGES/DRESSINGS) ×2 IMPLANT
DURAPREP 26ML APPLICATOR (WOUND CARE) ×3 IMPLANT
ELECT REM PT RETURN 9FT ADLT (ELECTROSURGICAL) ×3
ELECTRODE REM PT RTRN 9FT ADLT (ELECTROSURGICAL) ×1 IMPLANT
GAUZE SPONGE 4X4 12PLY STRL LF (GAUZE/BANDAGES/DRESSINGS) ×2 IMPLANT
GLOVE BIOGEL PI IND STRL 8 (GLOVE) ×2 IMPLANT
GLOVE BIOGEL PI INDICATOR 8 (GLOVE) ×4
GLOVE ORTHO TXT STRL SZ7.5 (GLOVE) ×6 IMPLANT
GLOVE SURG SS PI 7.5 STRL IVOR (GLOVE) ×4 IMPLANT
GOWN STRL REUS W/ TWL LRG LVL3 (GOWN DISPOSABLE) ×2 IMPLANT
GOWN STRL REUS W/ TWL XL LVL3 (GOWN DISPOSABLE) ×1 IMPLANT
GOWN STRL REUS W/TWL 2XL LVL3 (GOWN DISPOSABLE) ×1 IMPLANT
GOWN STRL REUS W/TWL LRG LVL3 (GOWN DISPOSABLE) ×6
GOWN STRL REUS W/TWL XL LVL3 (GOWN DISPOSABLE) ×3
KIT BASIN OR (CUSTOM PROCEDURE TRAY) ×3 IMPLANT
KIT TURNOVER KIT B (KITS) ×3 IMPLANT
MANIFOLD NEPTUNE II (INSTRUMENTS) ×3 IMPLANT
NDL HYPO 25GX1X1/2 BEV (NEEDLE) ×1 IMPLANT
NDL SPNL 18GX3.5 QUINCKE PK (NEEDLE) ×1 IMPLANT
NEEDLE HYPO 25GX1X1/2 BEV (NEEDLE) ×3 IMPLANT
NEEDLE SPNL 18GX3.5 QUINCKE PK (NEEDLE) ×3 IMPLANT
NS IRRIG 1000ML POUR BTL (IV SOLUTION) ×3 IMPLANT
PACK LAMINECTOMY ORTHO (CUSTOM PROCEDURE TRAY) ×3 IMPLANT
PAD ABD 8X10 STRL (GAUZE/BANDAGES/DRESSINGS) ×2 IMPLANT
PAD ARMBOARD 7.5X6 YLW CONV (MISCELLANEOUS) ×8 IMPLANT
PATTIES SURGICAL .5 X.5 (GAUZE/BANDAGES/DRESSINGS) IMPLANT
PATTIES SURGICAL .75X.75 (GAUZE/BANDAGES/DRESSINGS) IMPLANT
STAPLER SKIN 35 WIDE (STAPLE) ×2 IMPLANT
SURGIFLO W/THROMBIN 8M KIT (HEMOSTASIS) ×2 IMPLANT
SUT VIC AB 0 CT1 27 (SUTURE)
SUT VIC AB 0 CT1 27XBRD ANBCTR (SUTURE) IMPLANT
SUT VIC AB 1 CTX 27 (SUTURE) ×2 IMPLANT
SUT VIC AB 1 CTX 36 (SUTURE) ×3
SUT VIC AB 1 CTX36XBRD ANBCTR (SUTURE) ×1 IMPLANT
SUT VIC AB 2-0 CT1 27 (SUTURE) ×3
SUT VIC AB 2-0 CT1 TAPERPNT 27 (SUTURE) ×1 IMPLANT
SUT VIC AB 3-0 X1 27 (SUTURE) ×3 IMPLANT
TAPE CLOTH SURG 4X10 WHT LF (GAUZE/BANDAGES/DRESSINGS) ×2 IMPLANT
TOWEL OR 17X24 6PK STRL BLUE (TOWEL DISPOSABLE) ×3 IMPLANT
TOWEL OR 17X26 10 PK STRL BLUE (TOWEL DISPOSABLE) ×3 IMPLANT

## 2018-12-05 NOTE — Anesthesia Postprocedure Evaluation (Signed)
Anesthesia Post Note  Patient: Nicholas [name]  Procedure(s) Performed: LUMBAR THREE TO FOUR AND LUMBAR FOUR TO FIVE DECOMPRESSION (N/A Back)     Patient location during evaluation: PACU Anesthesia Type: General Level of consciousness: awake and alert Pain management: pain level controlled Vital Signs Assessment: post-procedure vital signs reviewed and stable Respiratory status: spontaneous breathing, nonlabored ventilation and respiratory function stable Cardiovascular status: blood pressure returned to baseline and stable Postop Assessment: no apparent nausea or vomiting Anesthetic complications: no    Last Vitals:  Vitals:   12/05/18 1330 12/05/18 1345  BP: 112/72 101/79  Pulse: 73 81  Resp: (!) 26 (!) 25  Temp:    SpO2: 98% (!) 87%    Last Pain:  Vitals:   12/05/18 1257  TempSrc:   PainSc: [place]

## 2018-12-05 NOTE — Transfer of Care (Signed)
Immediate Anesthesia Transfer of Care Note  Patient: Nicholas [name]  Procedure(s) Performed: LUMBAR THREE TO FOUR AND LUMBAR FOUR TO FIVE DECOMPRESSION (N/A Back)  Patient Location: PACU  Anesthesia Type:General  Level of Consciousness: awake, alert  and oriented  Airway & Oxygen Therapy: Patient Spontanous Breathing and Patient connected to face mask oxygen  Post-op Assessment: Report given to RN and Post -op Vital signs reviewed and stable  Post vital signs: Reviewed and stable  Last Vitals:  Vitals Value Taken Time  BP 174/83 12/05/2018 12:57 PM  Temp    Pulse 85 12/05/2018 12:59 PM  Resp 14 12/05/2018 12:59 PM  SpO2 99 % 12/05/2018 12:59 PM  Vitals shown include unvalidated device data.  Last Pain:  Vitals:   12/05/18 0729  TempSrc:   PainSc: 7       Patients Stated Pain Goal: 3 (51/88/41 [id number])  Complications: No apparent anesthesia complications

## 2018-12-05 NOTE — Op Note (Addendum)
Preop diagnosis: L3-4, L4-5 spinal stenosis  Postop diagnosis: Same  Procedure: Two-level lumbar decompression L3-4, L4-5.operative micoscope. ( three segment decompression L3,L4,L5 )  Surgeon: Rodell Perna, MD  Assistant Benjiman Core, PA-C medically necessary and present for the entire procedure  Anesthesia General  EBL 150cc plus Marcaine local.  Procedure after induction general anesthesia vancomycin due to the patient's positive PCR for MSSA, procedure was completed after patient was placed prone on chest rolls careful padding and positioning due to his previous shoulder arthroplasty.  Back was prepped with DuraPrep there is grade of towels Betadine Steri-Drape applied after sterile skin marker and laminectomy sheets and drapes.  Needle localization crosstable lateral x-ray needles were adjusted second x-ray was taken and midline incision was made subperiosteal dissection was performed down to the spinous process and on the lamina at L3-L4 and L5.  Coker clamps were placed repeat x-rays were taken and then decompression was performed removing spinous process at L3-4 and top portion of 5 with thinning of the lamina with a 4 mm bur.  Operative microscope was draped and brought in for microdissection, visualization and protection of the dura.  Thick chunks of ligament were removed dura was carefully protected and central decompression was performed resecting overhanging spurs off of the facet joints and hypertrophic ligamentum that was causing stenosis primarily at the level of 3 4 disc at the level of 4 5 disc.  Lateral recess was decompressed.  Dural tube was round intact.  No areas of compression were present.  Surgi-Flo was placed in the gutters and left for several minutes.  Epidural space was dry repeat irrigation and standard layer closure #1 Vicryl the deep fascia 2-0 Vicryl subtenons tissue skin staple closure postop dressing and transfer the cover him.

## 2018-12-05 NOTE — Anesthesia Procedure Notes (Signed)
Procedure Name: Intubation Date/Time: 12/05/2018 10:32 AM Performed by: Genelle Bal, CRNA Pre-anesthesia Checklist: Patient identified, Emergency Drugs available, Suction available and Patient being monitored Patient Re-evaluated:Patient Re-evaluated prior to induction Oxygen Delivery Method: Circle system utilized Preoxygenation: Pre-oxygenation with 100% oxygen Induction Type: IV induction and Rapid sequence Ventilation: Mask ventilation without difficulty Laryngoscope Size: Miller and 2 Grade View: Grade I Tube type: Oral Tube size: 7.5 mm Number of attempts: 1 Airway Equipment and Method: Stylet and Oral airway Placement Confirmation: ETT inserted through vocal cords under direct vision,  positive ETCO2 and breath sounds checked- equal and bilateral Secured at: 24 cm Tube secured with: Tape Dental Injury: Teeth and Oropharynx as per pre-operative assessment

## 2018-12-05 NOTE — H&P (Signed)
Office Visit Note  Patient: Nicholas [name]  Date of Birth: [birth year]  MRN: [id number]  Visit Date: 10/01/2018  Requested by: [name], MD  [place], [place] [address]  PCP: [name], MD  Assessment & Plan:  Visit Diagnoses:  1. Spinal stenosis of lumbar region without neurogenic claudication    Plan: MRI scan shows progressive spinal stenosis most severe at L3-4 and L4-5. He has stable left foraminal stenosis L5-S1. We discussed options with the patient and he is having neurogenic claudication symptoms without radiculopathy. Plan to be decompression L3-4 L4-5 2 level decompression centrally. He need to have medical clearance due to his past medical history. He is tolerated shoulder arthroplasties as well as total knee arthroplasty and knee fusion in the past. Procedure discussed questions were elicited and answered he understands and requests we proceed.  Follow-Up Instructions: No follow-ups on file.  Orders:     Orders Placed This Encounter  Procedures  . XR Lumbar Spine 2-3 Views   No orders of the defined types were placed in this encounter.   Procedures:  No procedures performed  Clinical Data:  No additional findings.  Subjective:     Chief Complaint  Patient presents with  . Lower Back - Follow-up, Pain   HPI 68 year old male returns with ongoing problems with back pain claudication symptoms with standing and walking. He gets relief leaning over a grocery cart. He states he can stand for 5 minutes  Review of Systems 14 point review of system positive for previous total knee arthroplasty MRSA infection on the right with long rod and fused stable knee in near extension. Lumbar spinal stenosis with neurogenic claudication symptoms. Obesity BMI 40, hypothyroidism. Previous left reverse total shoulder arthroplasty Dr. [name] Sa 2018 November. Right reverse shoulder arthroplasty July 2019 Dr. [name] Sa. Positive for GERD history of diverticulitis. History  of congestive heart failure, fatty liver.  Objective:  Vital Signs: There were no vitals taken for this visit.  Physical Exam  Constitutional:  Appearance: He is well-developed.  HENT:  Head: Normocephalic and atraumatic.  Eyes:  Pupils: Pupils are equal, round, and reactive to light.  Neck:  Thyroid: No thyromegaly.  Trachea: No tracheal deviation.  Cardiovascular:  Rate and Rhythm: Normal rate.  Pulmonary:  Effort: Pulmonary effort is normal.  Breath sounds: No wheezing.  Abdominal:  General: Bowel sounds are normal.  Palpations: Abdomen is soft.  Skin:  General: Skin is warm and dry.  Capillary Refill: Capillary refill takes less than 2 seconds.  Neurological:  Mental Status: He is alert and oriented to person, place, and time.  Psychiatric:  Behavior: Behavior normal.  Thought Content: Thought content normal.  Judgment: Judgment normal.   Ortho Exam fused right knee well-healed incision. Negative straight leg raising 90 degrees. Ambulates without use of a cane. Knee jerk is intact on the left ankle jerks intact right and left. Distal pulses palpable.  Specialty Comments:  No specialty comments available.  Imaging:CLINICAL DATA: Chronic low back pain with bilateral sciatica  EXAM:  MRI LUMBAR SPINE WITHOUT CONTRAST  TECHNIQUE:  Multiplanar, multisequence MR imaging of the lumbar spine was  performed. No intravenous contrast was administered.  COMPARISON: Lumbar MRI 06/27/2017  FINDINGS:  Segmentation: L5 is a transitional vertebra. Level assignment is  consistent with prior MRI report.  Alignment: Mild retrolisthesis L1-2, L2-3, L3-4. Mild levo  scoliosis.  Vertebrae: Negative for fracture or mass  Conus medullaris and cauda equina: Conus extends to the L1-2 level.  Conus and cauda equina appear normal.  Paraspinal and other soft tissues: Small right renal cyst. No  paraspinous mass.  Disc levels:  T10-11: Moderate disc and facet degeneration with spurring. Mild   spinal stenosis  T11-12: Moderate disc and facet degeneration. Mild to moderate  spinal stenosis  T12-L1: Negative  L1-2: Mild retrolisthesis. Disc and facet degeneration moderate  spinal stenosis has progressed in the interval.  L2-3: Mild retrolisthesis. Moderate disc and facet degeneration.  Moderate spinal stenosis with progression from the prior study.  Moderate subarticular stenosis bilaterally.  L3-4: Disc degeneration with disc bulging and spurring. Moderate  facet hypertrophy bilaterally. Moderate to severe spinal stenosis  with progression. Moderate subarticular stenosis bilaterally.  L4-5: Disc degeneration. Diffuse endplate spurring. Severe facet  degeneration with bony and ligament hypertrophy. Moderate to severe  spinal stenosis with progression. Moderate subarticular stenosis  bilaterally  L5-S1: Advanced disc degeneration on the left. Left-sided endplate  spurring and facet hypertrophy with marked left-sided foraminal  stenosis unchanged prior study.  IMPRESSION:  Multilevel degenerative change throughout the lumbar spine with  progression since the prior study. Progressive spinal stenosis at  L1-2, L2-3, L3-4, and L4-5.  Severe left foraminal encroachment due to spurring L5-S1 unchanged.  Electronically Signed  By: Franchot Gallo M.D.  On: 09/02/2018 09:19  PMFS History:      Patient Active Problem List   Diagnosis Date Noted  . Facial rash 05/26/2018  . Shoulder arthritis 05/13/2018  . Status post reverse total replacement of right shoulder 05/13/2018  . Pain and swelling of lower leg, right 03/20/2018  . Epigastric pain 02/26/2018  . Acute on chronic congestive heart failure ([place]) 12/27/2017  . OSA (obstructive sleep apnea) 12/27/2017  . Health maintenance examination 11/25/2017  . Renal insufficiency 10/01/2017  . Fatty liver 10/01/2017  . Status post reverse total replacement of left shoulder 09/03/2017  . Bilateral shoulder pain 05/24/2017  . History  of CVA in adulthood 03/08/2017  . S/P total knee arthroplasty, left 01/30/2017  . Arthritis of knee 01/15/2017  . Skin rash 12/10/2016  . Medicare annual wellness visit, initial 10/05/2016  . Advanced care planning/counseling discussion 10/05/2016  . Severe obesity (BMI 35.0-39.9) with comorbidity ([place]) 07/19/2016  . Hypothyroidism   . IDA (iron deficiency anemia) 04/23/2016  . Chronic pain syndrome 04/23/2016  . Inflammatory arthritis 04/23/2016  . Chronic leg pain 04/23/2016  . Lesion of palate 04/23/2016  . Grieving 04/23/2016  . GERD (gastroesophageal reflux disease)   . Duodenitis determined by biopsy 02/13/2016  . Diverticulosis of colon without hemorrhage 01/31/2016  . Chest pain 01/11/2012  . Migraine 01/11/2012  . History of methicillin resistant staphylococcus aureus (MRSA) 07/04/2010  . DEGENERATIVE DISC DISEASE 07/04/2010  . History of total right knee replacement 07/04/2010  . Primary osteoarthritis, right shoulder 06/09/2007  . Personal history of colonic adenoma 06/01/2003       Past Medical History:  Diagnosis Date  . Acute kidney failure 08/2008   "cleared up" no problems since  . Allergy   . Anxiety   . Arthritis   . Asthma    ?of this no inhaler  . Blood transfusion   . CHF (congestive heart failure) ([place])    ?of this, pt denies  . CLOSTRIDIUM DIFFICILE COLITIS 07/04/2010   Annotation: 12/09, 2/10 Qualifier: Diagnosis of By: [name], [name]   . Depression   . Depression with anxiety   . Duodenitis determined by biopsy 02/2016   peptic likely due to aleve (erosive gastropathy with duodenal  erosions)  . ECZEMA 07/04/2010   Qualifier: Diagnosis of By: [name], [name]   . Elevated liver enzymes   . GERD (gastroesophageal reflux disease)   . Heart attack ([place])    08/2008 (likely demand ischemia in the setting of MSSA sepsis/R TKA infection)10-2008  . History of hiatal hernia   . History of stomach ulcers   . Hypothyroidism   . Lower GI bleeding   .  MALAR AND MAXILLARY BONES CLOSED FRACTURE 07/04/2010   Annotation: ORIF Qualifier: Diagnosis of By: [name], [name]   . Migraine    "definitely"  . MRSA (methicillin resistant Staphylococcus aureus)    in leg, had to place steel rod in leg  . Personal history of colonic adenoma 06/01/2003  . PFO (patent foramen ovale)    small PFO by 08/2008 TEE  . Pneumonia 08/2008   "while in ICU"  . Seizures ([place]) 2009   "long time ago"   . Stroke [facility]) 2010   unable to complete sentences at times        Family History  Problem Relation Age of Onset  . Coronary artery disease Father 20  . Stroke Father   . CAD Mother 30   stents  . Breast cancer Sister   . Diabetes Brother   . Diabetes Sister   . Colon cancer Neg Hx   . Stomach cancer Neg Hx         Past Surgical History:  Procedure Laterality Date  . anterior nerve transposition  07/2009   left ulnar nerve  . antibiotic spacer exchange  11/2008; 08/2006   right knee  . ARTHROTOMY  08/2008   right knee w/I&D  . CARDIAC CATHETERIZATION  02/04/2018  . CARDIOVASCULAR STRESS TEST  2013   stress EKG - negative for ischemia, 4 min 7.3 METs, normal blood pressure response  . CATARACT EXTRACTION    . COLONOSCOPY  11/2012   diverticulosis, rpt 10 yrs [name])  . ESOPHAGOGASTRODUODENOSCOPY  02/2016   erosive gastropathy with duodenal erosions [name])  . HARDWARE REMOVAL  04/2006   right knee w/antibiotic spacers placed  . INGUINAL HERNIA REPAIR  early 1990's   bilateral  . JOINT REPLACEMENT Right    rt knee  . KNEE ARTHROSCOPY  10/2001   right  . KNEE FUSION  03/2009   right knee removal; antibiotic spacers;   . LEFT HEART CATH AND CORONARY ANGIOGRAPHY N/A 02/04/2018   WNL (Patwardhan, Reynold Bowen, MD)  . REPLACEMENT TOTAL KNEE  08/2008; 09/2001   right  . REVERSE SHOULDER ARTHROPLASTY Left 09/03/2017   Procedure: REVERSE SHOULDER ARTHROPLASTY; Surgeon: Meredith Pel, MD; Location: [facility]; Service: Orthopedics; Laterality: Left;    . REVERSE SHOULDER ARTHROPLASTY Right 05/13/2018   Procedure: RIGHT REVERSE SHOULDER ARTHROPLASTY; Surgeon: Meredith Pel, MD; Location: [place]; Service: Orthopedics; Laterality: Right;  . rod placement Right   . SYNOVECTOMY  06/2005   debridement, liner exchange right knee  . TOE AMPUTATION Left 08/2008   great toe - osteomyelitis (staph infection)  . TOE AMPUTATION Left 04/2016   2nd toe [name]  . TONSILLECTOMY    . TOTAL KNEE ARTHROPLASTY Left 01/15/2017  . TOTAL KNEE ARTHROPLASTY Left 01/15/2017   Procedure: TOTAL KNEE ARTHROPLASTY; Surgeon: Meredith Pel, MD; Location: [facility]; Service: Orthopedics; Laterality: Left;   Social History        Occupational History  . Occupation: retired  Tobacco Use  . Smoking status: Never Smoker  . Smokeless tobacco: Never Used  Substance  and Sexual Activity  . Alcohol use: No    Alcohol/week: 0.0 standard drinks    Comment: "I abused alcohol; last drink ~ 2005"  . Drug use: No  . Sexual activity: Never

## 2018-12-05 NOTE — Interval H&P Note (Signed)
History and Physical Interval Note:  12/05/2018 9:48 AM  Nicholas [name]  has presented today for surgery, with the diagnosis of L3-4, L4-5 lumbar stenosis  The various methods of treatment have been discussed with the patient and family. After consideration of risks, benefits and other options for treatment, the patient has consented to  Procedure(s): L 3-4, L4-5 DECOMPRESSION (N/A) as a surgical intervention .  The patient's history has been reviewed, patient examined, no change in status, stable for surgery.  I have reviewed the patient's chart and labs.  Questions were answered to the patient's satisfaction.     [name]

## 2018-12-05 NOTE — Plan of Care (Signed)
  Problem: Safety: Goal: Ability to remain free from injury will improve Outcome: Progressing   Problem: Education: Goal: Ability to verbalize activity precautions or restrictions will improve Outcome: Progressing Goal: Knowledge of the prescribed therapeutic regimen will improve Outcome: Progressing Goal: Understanding of discharge needs will improve Outcome: Progressing   Problem: Activity: Goal: Ability to avoid complications of mobility impairment will improve Outcome: Progressing Goal: Ability to tolerate increased activity will improve Outcome: Progressing Goal: Will remain free from falls Outcome: Progressing   Problem: Pain Management: Goal: Pain level will decrease Outcome: Progressing

## 2018-12-05 NOTE — Interval H&P Note (Signed)
History and Physical Interval Note:  12/05/2018 9:49 AM  Nicholas [name]  has presented today for surgery, with the diagnosis of L3-4, L4-5 lumbar stenosis  The various methods of treatment have been discussed with the patient and family. After consideration of risks, benefits and other options for treatment, the patient has consented to  Procedure(s): L 3-4, L4-5 DECOMPRESSION (N/A) as a surgical intervention .  The patient's history has been reviewed, patient examined, no change in status, stable for surgery.  I have reviewed the patient's chart and labs.  Questions were answered to the patient's satisfaction.     [name]

## 2018-12-06 ENCOUNTER — Encounter (HOSPITAL_COMMUNITY): Payer: Self-pay | Admitting: Orthopaedic Surgery

## 2018-12-06 DIAGNOSIS — M48061 Spinal stenosis, lumbar region without neurogenic claudication: Secondary | ICD-10-CM | POA: Diagnosis not present

## 2018-12-06 NOTE — Discharge Summary (Signed)
Physician Discharge Summary      Patient ID: [name] MRN: [id number] DOB/AGE: [birth year] 68 y.o.  Admit date: 12/05/2018 Discharge date: 12/06/2018  Admission Diagnoses:  <principal problem not specified>  Discharge Diagnoses:  Active Problems:   Lumbar stenosis   Past Medical History:  Diagnosis Date  . Acute kidney failure 08/2008   "cleared up"  no problems since  . Allergy   . Anxiety   . Arthritis   . Asthma    ?of this no inhaler  . Blood transfusion   . CHF (congestive heart failure) ([place])    ?of this, pt denies  . CLOSTRIDIUM DIFFICILE COLITIS 07/04/2010   Annotation: 12/09, 2/10 Qualifier: Diagnosis of  By: [name], [name]    . Depression with anxiety   . Duodenitis determined by biopsy 02/2016   peptic likely due to aleve (erosive gastropathy with duodenal erosions)  . ECZEMA 07/04/2010   Qualifier: Diagnosis of  By: [name], [name]    . Elevated liver enzymes   . Full dentures   . GERD (gastroesophageal reflux disease)   . Heart attack ([place])    08/2008 (likely demand ischemia in the setting of MSSA sepsis/R TKA  infection)10-2008  . History of hiatal hernia   . History of stomach ulcers   . Hypothyroidism   . Lower GI bleeding   . MALAR AND MAXILLARY BONES CLOSED FRACTURE 07/04/2010   Annotation: ORIF Qualifier: Diagnosis of  By: [name], [name]    . Migraine    "definitely"  . MRSA (methicillin resistant Staphylococcus aureus)    in leg, had to place steel rod in leg  . Personal history of colonic adenoma 06/01/2003  . PFO (patent foramen ovale)    small PFO by 08/2008 TEE  . Pneumonia 08/2008   "while in ICU"  . Seizures ([place]) 2009   "long time ago"    . Sleep apnea    does not wear CPAP  . Stroke [facility]) 2010   unable to complete sentences at times  . Wears glasses     Surgeries: Procedure(s): LUMBAR THREE TO FOUR AND LUMBAR FOUR TO FIVE DECOMPRESSION on 12/05/2018   Consultants (if any):   Discharged Condition:  Improved  Hospital Course: Nicholas [name] is an 68 y.o. male who was admitted 12/05/2018 with a diagnosis of <principal problem not specified> and went to the operating room on 12/05/2018 and underwent the above named procedures.    He was given perioperative antibiotics:  Anti-infectives (From admission, onward)   Start     Dose/Rate Route Frequency Ordered Stop   12/05/18 1500  ceFAZolin (ANCEF) IVPB 1 g/50 mL premix     1 g 100 mL/hr over 30 Minutes Intravenous Every 8 hours 12/05/18 1453 12/05/18 2316   12/05/18 0800  ceFAZolin (ANCEF) 3 g in dextrose 5 % 50 mL IVPB     3 g 100 mL/hr over 30 Minutes Intravenous On call to O.R. 12/04/18 1013 12/05/18 1531   12/05/18 0800  vancomycin (VANCOCIN) 1,500 mg in sodium chloride 0.9 % 500 mL IVPB     1,500 mg 250 mL/hr over 120 Minutes Intravenous To Surgery 12/04/18 1021 12/05/18 1533    .  He was given sequential compression devices, early ambulation, and appropriate chemoprophylaxis for DVT prophylaxis.  He benefited maximally from the hospital stay and there were no complications.    Recent vital signs:  Vitals:   12/05/18 2303 12/06/18 0434  BP: 126/70 (!) 118/53  Pulse: 82 80  Resp: 20 20  Temp: 99 F (37.2 C) 98.4 F (36.9 C)  SpO2: 92% 91%    Recent laboratory studies:  Lab Results  Component Value Date   HGB 13.4 12/05/2018   HGB 14.0 11/18/2018   HGB 13.6 10/17/2018   Lab Results  Component Value Date   WBC 9.3 12/05/2018   PLT 245 12/05/2018   Lab Results  Component Value Date   INR 1.0 10/17/2018   Lab Results  Component Value Date   NA 136 12/05/2018   K 3.8 12/05/2018   CL 101 12/05/2018   CO2 25 12/05/2018   BUN 21 12/05/2018   CREATININE 1.37 (H) 12/05/2018   GLUCOSE 117 (H) 12/05/2018    Discharge Medications:   Allergies as of 12/06/2018      Reactions   Bee Venom Anaphylaxis, Other (See Comments)   Respiratory Distress   Peanut-containing Drug Products Anaphylaxis, Dermatitis   Celebrex  [celecoxib] Other (See Comments)   BLACK STOOL?MELENA?   Percocet [oxycodone-acetaminophen] Hives, Swelling, Other (See Comments)   Latex Rash   Nsaids Rash      Medication List    STOP taking these medications   desonide 0.05 % cream Commonly known as:  DESOWEN   methocarbamol 500 MG tablet Commonly known as:  ROBAXIN   traMADol 50 MG tablet Commonly known as:  ULTRAM   triamcinolone cream 0.5 % Commonly known as:  KENALOG     TAKE these medications   acetaminophen 500 MG tablet Commonly known as:  TYLENOL Take 1,000 mg by mouth 2 (two) times daily. IN THE AFTERNOON & AT NIGHT   amitriptyline 50 MG tablet Commonly known as:  ELAVIL Take 2 tablets (100 mg total) by mouth at bedtime.   atorvastatin 20 MG tablet Commonly known as:  LIPITOR Take 1 tablet (20 mg total) by mouth daily.   clotrimazole 1 % cream Commonly known as:  LOTRIMIN Apply 1 application topically 2 (two) times daily. To groin What changed:  when to take this   furosemide 20 MG tablet Commonly known as:  LASIX TAKE 1 TABLET BY MOUTH EVERY DAY AS NEEDED FOR FLUID OR EDEMA What changed:  See the new instructions.   gabapentin 300 MG capsule Commonly known as:  NEURONTIN TAKE 1 CAPSULE BY MOUTH FOUR TIMES A DAY What changed:  See the new instructions.   ketoconazole 2 % shampoo Commonly known as:  NIZORAL Apply 1 application topically 2 (two) times a week. What changed:  when to take this   levothyroxine 100 MCG tablet Commonly known as:  SYNTHROID, LEVOTHROID Take 1 tablet (100 mcg total) by mouth daily before breakfast.   metoprolol 200 MG 24 hr tablet Commonly known as:  TOPROL-XL Take 200 mg by mouth daily.   nitroGLYCERIN 0.4 MG SL tablet Commonly known as:  NITROSTAT Place 0.4 mg under the tongue every 5 (five) minutes as needed for chest pain.   pantoprazole 40 MG tablet Commonly known as:  PROTONIX TAKE 1 TABLET BY MOUTH EVERY DAY   SUMAtriptan 100 MG tablet Commonly known  as:  IMITREX Take 1 tablet (100 mg total) by mouth daily as needed for migraine (repeat in 2 hours if needed). Limit to 2/24 hour period   tapentadol HCl 75 MG tablet Commonly known as:  NUCYNTA Take 1 tablet (75 mg total) by mouth every 6 (six) hours as needed for severe pain.       Diagnostic Studies: Dg Lumbar Spine Complete  Result Date: 12/05/2018 CLINICAL  DATA:  L3-L4 and L4-L5 discectomies. EXAM: LUMBAR SPINE - COMPLETE 4+ VIEW COMPARISON:  Lumbar spine x-rays dated October 01, 2018. FINDINGS: Five lateral intraoperative x-rays are submitted for review. Image number 1 demonstrates localizer needles posterior to the L3-L4 and S1-S2 levels. Image number 2 demonstrates localizer needles posterior to the L3 and L5-S1 levels. Image number 3 demonstrates surgical instrumentation posterior to the L3 and L4-L5 levels. Image number 4 demonstrates surgical instrumentation posterior to the L3-L4 and L4-L5 levels with interval posterior decompression at these levels. Image number 5 demonstrates surgical instrumentation posterior to the L3 and L5 vertebral bodies. No acute osseous abnormality. Unchanged trace retrolisthesis at L4-L5. IMPRESSION: Intraoperative localization for lower lumbar surgery as described above. Electronically Signed   By: Titus Dubin M.D.   On: 12/05/2018 12:38    Disposition: Discharge disposition: 01-Home or Self Care       Discharge Instructions    Call MD / Call 911   Complete by:  As directed    If you experience chest pain or shortness of breath, CALL 911 and be transported to the hospital emergency room.  If you develope a fever above 101.5 F, pus (white drainage) or increased drainage or redness at the wound, or calf pain, call your surgeon's office.   Constipation Prevention   Complete by:  As directed    Drink plenty of fluids.  Prune juice may be helpful.  You may use a stool softener, such as Colace (over the counter) 100 mg twice a day.  Use MiraLax  (over the counter) for constipation as needed.   Driving restrictions   Complete by:  As directed    No driving while taking narcotic pain meds.   Increase activity slowly as tolerated   Complete by:  As directed       Follow-up Information    Schedule an appointment as soon as possible for a visit with [name], MD.   Specialty:  Orthopedic Surgery Why:  need return office visit one week.  Contact information: [address] [place] [address] [phone]            Signed: Eduard Roux 12/06/2018, 6:59 AM

## 2018-12-06 NOTE — Progress Notes (Signed)
Patient is discharged from room 3C02 at this time. Alert and in stable condition. IV site d/c'd and instructions read to patient and daughter with understanding verbalized. Left unit via wheelchair with all belongings at side. 

## 2018-12-06 NOTE — Progress Notes (Signed)
   Subjective:  Patient reports pain as mild.   Has walked in the hallway 3x already without any difficulty  Objective:   VITALS:   Vitals:   12/05/18 1704 12/05/18 1939 12/05/18 2303 12/06/18 0434  BP: 126/72 115/68 126/70 (!) 118/53  Pulse: 77 83 82 80  Resp: 20 20 20 20   Temp: 98.3 F (36.8 C) 98.2 F (36.8 C) 99 F (37.2 C) 98.4 F (36.9 C)  TempSrc: Oral Oral Oral Oral  SpO2: 94% 92% 92% 91%  Weight:      Height:        Neurologically intact Neurovascular intact Sensation intact distally Intact pulses distally Dorsiflexion/Plantar flexion intact Incision: dressing C/D/I and no drainage No cellulitis present Compartment soft   Lab Results  Component Value Date   WBC 9.3 12/05/2018   HGB 13.4 12/05/2018   HCT 43.7 12/05/2018   MCV 88.6 12/05/2018   PLT 245 12/05/2018     Assessment/Plan:  1 Day Post-Op   He is doing quite well this morning.  Pain is well controlled.  Ready for d/c home this morning.    Eduard Roux 12/06/2018, 6:57 AM [phone]

## 2018-12-10 ENCOUNTER — Telehealth (INDEPENDENT_AMBULATORY_CARE_PROVIDER_SITE_OTHER): Payer: Self-pay

## 2018-12-10 ENCOUNTER — Other Ambulatory Visit (INDEPENDENT_AMBULATORY_CARE_PROVIDER_SITE_OTHER): Payer: Self-pay | Admitting: Orthopaedic Surgery

## 2018-12-10 ENCOUNTER — Encounter: Payer: Self-pay | Admitting: Family Medicine

## 2018-12-10 ENCOUNTER — Ambulatory Visit (INDEPENDENT_AMBULATORY_CARE_PROVIDER_SITE_OTHER): Payer: PPO

## 2018-12-10 ENCOUNTER — Ambulatory Visit (INDEPENDENT_AMBULATORY_CARE_PROVIDER_SITE_OTHER): Payer: PPO | Admitting: Family Medicine

## 2018-12-10 VITALS — BP 102/68 | HR 72 | Temp 98.6°F | Ht 70.5 in | Wt 277.5 lb

## 2018-12-10 DIAGNOSIS — E039 Hypothyroidism, unspecified: Secondary | ICD-10-CM | POA: Diagnosis not present

## 2018-12-10 DIAGNOSIS — G8918 Other acute postprocedural pain: Secondary | ICD-10-CM | POA: Diagnosis not present

## 2018-12-10 DIAGNOSIS — M48061 Spinal stenosis, lumbar region without neurogenic claudication: Secondary | ICD-10-CM | POA: Diagnosis not present

## 2018-12-10 DIAGNOSIS — N289 Disorder of kidney and ureter, unspecified: Secondary | ICD-10-CM

## 2018-12-10 DIAGNOSIS — M79604 Pain in right leg: Secondary | ICD-10-CM | POA: Diagnosis not present

## 2018-12-10 DIAGNOSIS — E785 Hyperlipidemia, unspecified: Secondary | ICD-10-CM | POA: Diagnosis not present

## 2018-12-10 DIAGNOSIS — Z8673 Personal history of transient ischemic attack (TIA), and cerebral infarction without residual deficits: Secondary | ICD-10-CM

## 2018-12-10 DIAGNOSIS — G8929 Other chronic pain: Secondary | ICD-10-CM | POA: Diagnosis not present

## 2018-12-10 DIAGNOSIS — G894 Chronic pain syndrome: Secondary | ICD-10-CM

## 2018-12-10 DIAGNOSIS — Z0000 Encounter for general adult medical examination without abnormal findings: Secondary | ICD-10-CM

## 2018-12-10 DIAGNOSIS — I509 Heart failure, unspecified: Secondary | ICD-10-CM

## 2018-12-10 LAB — COMPREHENSIVE METABOLIC PANEL
ALK PHOS: 122 U/L — AB (ref 39–117)
ALT: 25 U/L (ref 0–53)
AST: 31 U/L (ref 0–37)
Albumin: 3.8 g/dL (ref 3.5–5.2)
BUN: 19 mg/dL (ref 6–23)
CO2: 26 mEq/L (ref 19–32)
Calcium: 8.9 mg/dL (ref 8.4–10.5)
Chloride: 99 mEq/L (ref 96–112)
Creatinine, Ser: 1.19 mg/dL (ref 0.40–1.50)
GFR: 60.78 mL/min (ref >60.00)
Glucose, Bld: 121 mg/dL — ABNORMAL HIGH (ref 70–99)
Potassium: 3.9 mEq/L (ref 3.5–5.1)
Sodium: 136 mEq/L (ref 135–145)
Total Bilirubin: 2.1 mg/dL — ABNORMAL HIGH (ref 0.2–1.2)
Total Protein: 7.5 g/dL (ref 6.0–8.3)

## 2018-12-10 MED ORDER — HYDROCODONE-ACETAMINOPHEN 5-325 MG PO TABS: 1.0000 | ORAL_TABLET | Freq: Four times a day (QID) | ORAL | 0 refills | Status: DC | PRN

## 2018-12-10 NOTE — Telephone Encounter (Signed)
Call him . See if he would like ultram # 30  1 po tid prn pain , if so OK to call in thanks

## 2018-12-10 NOTE — Assessment & Plan Note (Signed)
Rpt labs today pending. Levothyroxine decreased to 123mcg last month.

## 2018-12-10 NOTE — Patient Instructions (Addendum)
Call Dr Phoebe Sharps office to discuss new pain regimen if nucynta unaffordable.  Increase tylenol to 1000mg  three times daily with meals until you get new pain medicine through surgeon, then return to prior dosing of 4 tablets daily.  Good to see you today Return in 3-4 months for follow up visit.   Health Maintenance After Age 68 After age 19, you are at a higher risk for certain long-term diseases and infections as well as injuries from falls. Falls are a major cause of broken bones and head injuries in people who are older than age 19. Getting regular preventive care can help to keep you healthy and well. Preventive care includes getting regular testing and making lifestyle changes as recommended by your health care provider. Talk with your health care provider about:  Which screenings and tests you should have. A screening is a test that checks for a disease when you have no symptoms.  A diet and exercise plan that is right for you. What should I know about screenings and tests to prevent falls? Screening and testing are the best ways to find a health problem early. Early diagnosis and treatment give you the best chance of managing medical conditions that are common after age 25. Certain conditions and lifestyle choices may make you more likely to have a fall. Your health care provider may recommend:  Regular vision checks. Poor vision and conditions such as cataracts can make you more likely to have a fall. If you wear glasses, make sure to get your prescription updated if your vision changes.  Medicine review. Work with your health care provider to regularly review all of the medicines you are taking, including over-the-counter medicines. Ask your health care provider about any side effects that may make you more likely to have a fall. Tell your health care provider if any medicines that you take make you feel dizzy or sleepy.  Osteoporosis screening. Osteoporosis is a condition that causes the  bones to get weaker. This can make the bones weak and cause them to break more easily.  Blood pressure screening. Blood pressure changes and medicines to control blood pressure can make you feel dizzy.  Strength and balance checks. Your health care provider may recommend certain tests to check your strength and balance while standing, walking, or changing positions.  Foot health exam. Foot pain and numbness, as well as not wearing proper footwear, can make you more likely to have a fall.  Depression screening. You may be more likely to have a fall if you have a fear of falling, feel emotionally low, or feel unable to do activities that you used to do.  Alcohol use screening. Using too much alcohol can affect your balance and may make you more likely to have a fall. What actions can I take to lower my risk of falls? General instructions  Talk with your health care provider about your risks for falling. Tell your health care provider if: ? You fall. Be sure to tell your health care provider about all falls, even ones that seem minor. ? You feel dizzy, sleepy, or off-balance.  Take over-the-counter and prescription medicines only as told by your health care provider. These include any supplements.  Eat a healthy diet and maintain a healthy weight. A healthy diet includes low-fat dairy products, low-fat (lean) meats, and fiber from whole grains, beans, and lots of fruits and vegetables. Home safety  Remove any tripping hazards, such as rugs, cords, and clutter.  Install safety equipment  such as grab bars in bathrooms and safety rails on stairs.  Keep rooms and walkways well-lit. Activity   Follow a regular exercise program to stay fit. This will help you maintain your balance. Ask your health care provider what types of exercise are appropriate for you.  If you need a cane or walker, use it as recommended by your health care provider.  Wear supportive shoes that have nonskid  soles. Lifestyle  Do not drink alcohol if your health care provider tells you not to drink.  If you drink alcohol, limit how much you have: ? 0-1 drink a day for women. ? 0-2 drinks a day for men.  Be aware of how much alcohol is in your drink. In the [place] one drink equals one typical bottle of beer (12 oz), one-half glass of wine (5 oz), or one shot of hard liquor (1 oz).  Do not use any products that contain nicotine or tobacco, such as cigarettes and e-cigarettes. If you need help quitting, ask your health care provider. Summary  Having a healthy lifestyle and getting preventive care can help to protect your health and wellness after age 61.  Screening and testing are the best way to find a health problem early and help you avoid having a fall. Early diagnosis and treatment give you the best chance for managing medical conditions that are more common for people who are older than age 73.  Falls are a major cause of broken bones and head injuries in people who are older than age 65. Take precautions to prevent a fall at home.  Work with your health care provider to learn what changes you can make to improve your health and wellness and to prevent falls. This information is not intended to replace advice given to you by your health care provider. Make sure you discuss any questions you have with your health care provider. Document Released: 08/14/2017 Document Revised: 08/14/2017 Document Reviewed: 08/14/2017 Elsevier Interactive Patient Education  2019 [employer].

## 2018-12-10 NOTE — Assessment & Plan Note (Signed)
Chronic, atorvastatin started 10/2018. Not yet due for rpt FLP

## 2018-12-10 NOTE — Telephone Encounter (Signed)
I sent in norco 5/325 # 30 . ucall let him know.

## 2018-12-10 NOTE — Patient Instructions (Signed)
Mr. Voong , Thank you for taking time to come for your Medicare Wellness Visit. I appreciate your ongoing commitment to your health goals. Please review the following plan we discussed and let me know if I can assist you in the future.   These are the goals we discussed: Goals    . Increase physical activity     Starting 12/10/18 and weather permitting, I will continue to walk at last 15 minutes daily.        This is a list of the screening recommended for you and due dates:  Health Maintenance  Topic Date Due  . Pneumonia vaccines (2 of 2 - PPSV23) 10/15/2019*  . DTaP/Tdap/Td vaccine (1 - Tdap) 10/14/2020*  . Tetanus Vaccine  10/14/2020*  . Colon Cancer Screening  12/11/2022  . Flu Shot  Completed  .  Hepatitis C: One time screening is recommended by Center for Disease Control  (CDC) for  adults born from [birth year] through 1965.   Completed  *Topic was postponed. The date shown is not the original due date.   Preventive Care for Adults  A healthy lifestyle and preventive care can promote health and wellness. Preventive health guidelines for adults include the following key practices.  . A routine yearly physical is a good way to check with your health care provider about your health and preventive screening. It is a chance to share any concerns and updates on your health and to receive a thorough exam.  . Visit your dentist for a routine exam and preventive care every 6 months. Brush your teeth twice a day and floss once a day. Good oral hygiene prevents tooth decay and gum disease.  . The frequency of eye exams is based on your age, health, family medical history, use  of contact lenses, and other factors. Follow your health care provider's recommendations for frequency of eye exams.  . Eat a healthy diet. Foods like vegetables, fruits, whole grains, low-fat dairy products, and lean protein foods contain the nutrients you need without too many calories. Decrease your intake of foods  high in solid fats, added sugars, and salt. Eat the right amount of calories for you. Get information about a proper diet from your health care provider, if necessary.  . Regular physical exercise is one of the most important things you can do for your health. Most adults should get at least 150 minutes of moderate-intensity exercise (any activity that increases your heart rate and causes you to sweat) each week. In addition, most adults need muscle-strengthening exercises on 2 or more days a week.  Silver Sneakers may be a benefit available to you. To determine eligibility, you may visit the website: www.silversneakers.com or contact program at 507-204-1721 Mon-Fri between 8AM-8PM.   . Maintain a healthy weight. The body mass index (BMI) is a screening tool to identify possible weight problems. It provides an estimate of body fat based on height and weight. Your health care provider can find your BMI and can help you achieve or maintain a healthy weight.   For adults 20 years and older: ? A BMI below 18.5 is considered underweight. ? A BMI of 18.5 to 24.9 is normal. ? A BMI of 25 to 29.9 is considered overweight. ? A BMI of 30 and above is considered obese.   . Maintain normal blood lipids and cholesterol levels by exercising and minimizing your intake of saturated fat. Eat a balanced diet with plenty of fruit and vegetables. Blood tests for lipids and  cholesterol should begin at age 57 and be repeated every 5 years. If your lipid or cholesterol levels are high, you are over 50, or you are at high risk for heart disease, you may need your cholesterol levels checked more frequently. Ongoing high lipid and cholesterol levels should be treated with medicines if diet and exercise are not working.  . If you smoke, find out from your health care provider how to quit. If you do not use tobacco, please do not start.  . If you choose to drink alcohol, please do not consume more than 2 drinks per day.  One drink is considered to be 12 ounces (355 mL) of beer, 5 ounces (148 mL) of wine, or 1.5 ounces (44 mL) of liquor.  . If you are 49-4 years old, ask your health care provider if you should take aspirin to prevent strokes.  . Use sunscreen. Apply sunscreen liberally and repeatedly throughout the day. You should seek shade when your shadow is shorter than you. Protect yourself by wearing long sleeves, pants, a wide-brimmed hat, and sunglasses year round, whenever you are outdoors.  . Once a month, do a whole body skin exam, using a mirror to look at the skin on your back. Tell your health care provider of new moles, moles that have irregular borders, moles that are larger than a pencil eraser, or moles that have changed in shape or color.

## 2018-12-10 NOTE — Assessment & Plan Note (Addendum)
Endorses significant improvement after recent lumbar stenosis decompression.

## 2018-12-10 NOTE — Progress Notes (Signed)
PCP notes:   Health maintenance:  PNA vaccine - PCP follow-up requested  Abnormal screenings:   None  Patient concerns:   Pain mgmt - unable to afford Nucynta; plans to discuss with surgeon at next appt  Nurse concerns:  None  Next PCP appt:   12/10/18 @ 1230

## 2018-12-10 NOTE — Telephone Encounter (Signed)
Patient says that he does not think that tramadol will help the pain he is having.  He says his pain is great.  He has taking hydrocodone in the past and wants to know if you think that will help, and could you send some to the pharmacy?

## 2018-12-10 NOTE — Assessment & Plan Note (Addendum)
CMP pending. Discussed importance of adequate hydration status.

## 2018-12-10 NOTE — Telephone Encounter (Signed)
See message from patient about meds.

## 2018-12-10 NOTE — Progress Notes (Signed)
Sent in # 30 norco .

## 2018-12-10 NOTE — Assessment & Plan Note (Signed)
S/p 2 level decompression by Dr [name] (11/2018), pt endorses significant improvement in leg pain.

## 2018-12-10 NOTE — Assessment & Plan Note (Signed)
Continues lasix 20mg  daily.

## 2018-12-10 NOTE — Progress Notes (Signed)
Subjective:   DARIVS LUNDEN is a 68 y.o. male who presents for Medicare Annual/Subsequent preventive examination.  Review of Systems:  N/A Cardiac Risk Factors include: advanced age (>5men, >53 women);male gender;obesity (BMI >30kg/m2)     Objective:    Vitals: BP 102/68 (BP Location: Right Arm, Patient Position: Sitting, Cuff Size: Large)   Pulse 72   Temp 98.6 F (37 C) (Oral)   Ht 5' 10.5" (1.791 m) Comment: SHOES  Wt 277 lb 8 oz (125.9 kg)   SpO2 91%   BMI 39.25 kg/m   Body mass index is 39.25 kg/m.  Advanced Directives 12/10/2018 12/05/2018 12/05/2018 11/18/2018 05/13/2018 05/05/2018 02/04/2018  Does Patient Have a Medical Advance Directive? No No No No No No No  Copy of Healthcare Power of Attorney in Chart? - - - - - - -  Would patient like information on creating a medical advance directive? No - Patient declined No - Patient declined No - Patient declined No - Patient declined Yes (Inpatient - patient defers creating a medical advance directive at this time) No - Patient declined No - Patient declined    Tobacco Social History   Tobacco Use  Smoking Status Never Smoker  Smokeless Tobacco Never Used     Counseling given: No   Clinical Intake:  Pre-visit preparation completed: Yes  Pain Score: 10-Worst pain ever Pain Type: Other (Comment), Acute pain(surgical pain) Pain Location: Back        How often do you need to have someone help you when you read instructions, pamphlets, or other written materials from your doctor or pharmacy?: 1 - Never What is the last grade level you completed in school?: 12th grade  Interpreter Needed?: No  Comments: pt lives alone Information entered by :: LPinson, LPN  Past Medical History:  Diagnosis Date  . Acute kidney failure 08/2008   "cleared up"  no problems since  . Allergy   . Anxiety   . Arthritis   . Asthma    ?of this no inhaler  . Blood transfusion   . CHF (congestive heart failure) ([place])    ?of this, pt  denies  . CLOSTRIDIUM DIFFICILE COLITIS 07/04/2010   Annotation: 12/09, 2/10 Qualifier: Diagnosis of  By: [name], [name]    . Depression with anxiety   . Duodenitis determined by biopsy 02/2016   peptic likely due to aleve (erosive gastropathy with duodenal erosions)  . ECZEMA 07/04/2010   Qualifier: Diagnosis of  By: [name], [name]    . Elevated liver enzymes   . Full dentures   . GERD (gastroesophageal reflux disease)   . Heart attack ([place])    08/2008 (likely demand ischemia in the setting of MSSA sepsis/R TKA  infection)10-2008  . History of hiatal hernia   . History of stomach ulcers   . Hypothyroidism   . Lower GI bleeding   . MALAR AND MAXILLARY BONES CLOSED FRACTURE 07/04/2010   Annotation: ORIF Qualifier: Diagnosis of  By: [name], [name]    . Migraine    "definitely"  . MRSA (methicillin resistant Staphylococcus aureus)    in leg, had to place steel rod in leg  . Personal history of colonic adenoma 06/01/2003  . PFO (patent foramen ovale)    small PFO by 08/2008 TEE  . Pneumonia 08/2008   "while in ICU"  . Seizures ([place]) 2009   "long time ago"    . Sleep apnea    does not wear CPAP  . Stroke (  [place]) 2010   unable to complete sentences at times  . Wears glasses    Past Surgical History:  Procedure Laterality Date  . anterior  nerve transposition  07/2009   left ulnar nerve  . antibiotic spacer exchange  11/2008; 08/2006   right knee  . ARTHROTOMY  08/2008   right knee w/I&D  . CARDIOVASCULAR STRESS TEST  2013   stress EKG - negative for ischemia, 4 min 7.3 METs, normal blood pressure response  . CATARACT EXTRACTION    . COLONOSCOPY  11/2012   diverticulosis, rpt 10 yrs [name])  . ESOPHAGOGASTRODUODENOSCOPY  02/2016   erosive gastropathy with duodenal erosions [name])  . HARDWARE REMOVAL  04/2006   right knee w/antibiotic spacers placed  . INGUINAL HERNIA REPAIR  early 1990's   bilateral  . JOINT REPLACEMENT Bilateral   . KNEE ARTHROSCOPY  10/2001    right  . KNEE FUSION  03/2009   right knee removal; antibiotic spacers;   . LEFT HEART CATH AND CORONARY ANGIOGRAPHY N/A 02/04/2018    WNL (Patwardhan, Reynold Bowen, MD)  . LUMBAR LAMINECTOMY/DECOMPRESSION MICRODISCECTOMY N/A 12/05/2018   Procedure: LUMBAR THREE TO FOUR AND LUMBAR FOUR TO FIVE DECOMPRESSION;  Surgeon: [name], MD;  Location: [facility];  Service: Orthopedics;  Laterality: N/A;  . MULTIPLE TOOTH EXTRACTIONS    . REPLACEMENT TOTAL KNEE  08/2008; 09/2001   right  . REVERSE SHOULDER ARTHROPLASTY Left 09/03/2017   Procedure: REVERSE SHOULDER ARTHROPLASTY;  Surgeon: Meredith Pel, MD;  Location: [facility];  Service: Orthopedics;  Laterality: Left;  . REVERSE SHOULDER ARTHROPLASTY Right 05/13/2018   Procedure: RIGHT REVERSE SHOULDER ARTHROPLASTY;  Surgeon: Meredith Pel, MD;  Location: [facility];  Service: Orthopedics;  Laterality: Right;  . rod placement Right   . SYNOVECTOMY  06/2005   debridement, liner exchange right knee  . TOE AMPUTATION Left 08/2008   great toe - osteomyelitis (staph infection)  . TOE AMPUTATION Left 04/2016   2nd toe [name]  . TONSILLECTOMY    . TOTAL KNEE ARTHROPLASTY Left 01/15/2017  . TOTAL KNEE ARTHROPLASTY Left 01/15/2017   Procedure: TOTAL KNEE ARTHROPLASTY;  Surgeon: Meredith Pel, MD;  Location: [facility];  Service: Orthopedics;  Laterality: Left;   Family History  Problem Relation Age of Onset  . Coronary artery disease Father 43  . Stroke Father   . CAD Mother 34       stents  . Breast cancer Sister   . Diabetes Brother   . Diabetes Sister   . Colon cancer Neg Hx   . Stomach cancer Neg Hx    Social History   Socioeconomic History  . Marital status: Widowed    Spouse name: Not on file  . Number of children: 2  . Years of education: 70  . Highest education level: Not on file  Occupational History  . Occupation: retired  Scientific laboratory technician  . Financial resource strain: Not on file  . Food insecurity:    Worry: Not on file     Inability: Not on file  . Transportation needs:    Medical: Not on file    Non-medical: Not on file  Tobacco Use  . Smoking status: Never Smoker  . Smokeless tobacco: Never Used  Substance and Sexual Activity  . Alcohol use: No    Alcohol/week: 0.0 standard drinks    Comment: "I abused alcohol; last drink  ~ 2005"  . Drug use: No  . Sexual activity: Never  Lifestyle  .  Physical activity:    Days per week: Not on file    Minutes per session: Not on file  . Stress: Not on file  Relationships  . Social connections:    Talks on phone: Not on file    Gets together: Not on file    Attends religious service: Not on file    Active member of club or organization: Not on file    Attends meetings of clubs or organizations: Not on file    Relationship status: Not on file  Other Topics Concern  . Not on file  Social History Narrative   Widower 2016 - wife passed from ESRD related illness   Lives in [place] in [place].    M[employer]on   Occ: landscaping   Edu: HS   Activity: no regular exercise    Diet: good water, fruits/vegetables daily      Screened positive for OSA 04/2018    Outpatient Encounter Medications as of 12/10/2018  Medication Sig  . acetaminophen (TYLENOL) 500 MG tablet Take 1,000 mg by mouth 2 (two) times daily. IN THE AFTERNOON & AT NIGHT  . amitriptyline (ELAVIL) 50 MG tablet Take 2 tablets (100 mg total) by mouth at bedtime.  [name] atorvastatin (LIPITOR) 20 MG tablet Take 1 tablet (20 mg total) by mouth daily.  . clotrimazole (LOTRIMIN) 1 % cream Apply 1 application topically 2 (two) times daily. To groin (Patient taking differently: Apply 1 application topically 3 (three) times daily. To groin)  . furosemide (LASIX) 20 MG tablet TAKE 1 TABLET BY MOUTH EVERY DAY AS NEEDED FOR FLUID OR EDEMA (Patient taking differently: Take 20 mg by mouth daily. )  . gabapentin (NEURONTIN) 300 MG capsule TAKE 1 CAPSULE BY MOUTH FOUR TIMES A DAY (Patient taking differently: Take  300-600 mg by mouth See admin instructions. Take 300 mg in the morning, 600 mg in the afternoon, and 300 mg at night)  . ketoconazole (NIZORAL) 2 % shampoo Apply 1 application topically 2 (two) times a week. (Patient taking differently: Apply 1 application topically 3 (three) times a week. )  . levothyroxine (SYNTHROID, LEVOTHROID) 100 MCG tablet Take 1 tablet (100 mcg total) by mouth daily before breakfast.  . metoprolol (TOPROL-XL) 200 MG 24 hr tablet Take 200 mg by mouth daily.  . nitroGLYCERIN (NITROSTAT) 0.4 MG SL tablet Place 0.4 mg under the tongue every 5 (five) minutes as needed for chest pain.  . pantoprazole (PROTONIX) 40 MG tablet TAKE 1 TABLET BY MOUTH EVERY DAY  . SUMAtriptan (IMITREX) 100 MG tablet Take 1 tablet (100 mg total) by mouth daily as needed for migraine (repeat in 2 hours if needed). Limit to 2/24 hour period  . tapentadol HCl (NUCYNTA) 75 MG tablet Take 1 tablet (75 mg total) by mouth every 6 (six) hours as needed for severe pain. (Patient not taking: Reported on 12/10/2018)   No facility-administered encounter medications on file as of 12/10/2018.     Activities of Daily Living In your present state of health, do you have any difficulty performing the following activities: 12/10/2018 12/05/2018  Hearing? N N  Vision? N N  Difficulty concentrating or making decisions? N N  Walking or climbing stairs? Y Y  Dressing or bathing? Y Y  Doing errands, shopping? N N  Preparing Food and eating ? N -  Using the Toilet? N -  In the past six months, have you accidently leaked urine? Y -  Do you have problems with loss of bowel control?  N -  Managing your Medications? N -  Managing your Finances? N -  Housekeeping or managing your Housekeeping? N -  Some recent data might be hidden    Patient Care Team: [name], MD as PCP - General (Family Medicine) Adrian Prows, MD (Cardiology)   Assessment:   This is a routine wellness examination for Trenden.   Hearing  Screening   125Hz  250Hz  500Hz  1000Hz  2000Hz  3000Hz  4000Hz  6000Hz  8000Hz   Right ear:   40 40 40  40    Left ear:   40 40 40  40    Vision Screening Comments: Vision exam in 2019 with Dr. Gershon Crane   Exercise Activities and Dietary recommendations Current Exercise Habits: Home exercise routine, Type of exercise: walking, Time (Minutes): 15, Frequency (Times/Week): 7, Weekly Exercise (Minutes/Week): 105, Intensity: Mild, Exercise limited by: None identified  Goals    . Increase physical activity     Starting 12/10/18 and weather permitting, I will continue to walk at last 15 minutes daily.        Fall Risk Fall Risk  12/10/2018 11/18/2017 10/05/2016 02/08/2016  Falls in the past year? 0 Yes No No  Comment - lost balance and fell - -  Number falls in past yr: - 1 - -  Injury with Fall? - No - -   Depression Screen PHQ 2/9 Scores 12/10/2018 11/18/2017 10/05/2016 02/08/2016  PHQ - 2 Score 0 0 3 0  PHQ- 9 Score 0 0 8 -    Cognitive Function MMSE - Mini Mental State Exam 12/10/2018 11/18/2017  Orientation to time 5 5  Orientation to Place 5 5  Registration 3 3  Attention/ Calculation 0 0  Recall 3 2  Recall-comments - unable to recall 1 of 3 words  Language- name 2 objects 0 0  Language- repeat 1 1  Language- follow 3 step command 3 3  Language- read & follow direction 0 0  Write a sentence 0 0  Copy design 0 0  Total score 20 19     PLEASE NOTE: A Mini-Cog screen was completed. Maximum score is 20. A value of 0 denotes this part of [place] MMSE was not completed or the patient failed this part of the Mini-Cog screening.   Mini-Cog Screening Orientation to Time - Max 5 pts Orientation to Place - Max 5 pts Registration - Max 3 pts Recall - Max 3 pts Language Repeat - Max 1 pts Language Follow 3 Step Command - Max 3 pts     Immunization History  Administered Date(s) Administered  . Influenza, High Dose Seasonal PF 08/15/2017  . Influenza,inj,Quad PF,6+ Mos 07/01/2013  .  Influenza-Unspecified 08/16/2011, 07/18/2016, 06/03/2018  . Pneumococcal Conjugate-13 10/05/2016  . Pneumococcal Polysaccharide-23 08/16/2011  . Td 01/30/2006  . Zoster 10/15/2012  . Zoster Recombinat (Shingrix) 12/12/2017, 04/22/2018   Screening Tests Health Maintenance  Topic Date Due  . PNA vac Low Risk Adult (2 of 2 - PPSV23) 10/15/2019 (Originally 10/05/2017)  . DTaP/Tdap/Td (1 - Tdap) 10/14/2020 (Originally 11/20/1961)  . TETANUS/TDAP  10/14/2020 (Originally 01/31/2016)  . COLONOSCOPY  12/11/2022  . INFLUENZA VACCINE  Completed  . Hepatitis C Screening  Completed       Plan:     I have personally reviewed, addressed, and noted the following in the patient's chart:  A. Medical and social history B. Use of alcohol, tobacco or illicit drugs  C. Current medications and supplements D. Functional ability and status E.  Nutritional status F.  Physical activity  G. Advance directives H. List of other physicians I.  Hospitalizations, surgeries, and ER visits in previous 12 months J.  [place] to include hearing, vision, cognitive, depression L. Referrals and appointments - none  In addition, I have reviewed and discussed with patient certain preventive protocols, quality metrics, and best practice recommendations. A written personalized care plan for preventive services as well as general preventive health recommendations were provided to patient.  See attached scanned questionnaire for additional information.   Signed,   Lindell Noe, MHA, BS, LPN Health Coach

## 2018-12-10 NOTE — Progress Notes (Signed)
BP 102/68 (BP Location: Right Arm, Patient Position: Sitting, Cuff Size: Large)   Pulse 72   Temp 98.6 F (37 C) (Oral)   Ht 5' 10.5" (1.791 m) Comment: shoes  Wt 277 lb 8 oz (125.9 kg)   SpO2 91%   BMI 39.25 kg/m    CC: CPE Subjective:    Patient ID: Nicholas [name], male    DOB: [birth year], 68 y.o.   MRN: [id number]  HPI: FARD BORUNDA is a 68 y.o. male presenting on 12/10/2018 for Annual Exam (Pt 2. )   Annia Belt today for medicare wellness visit. Note will be reviewed.    Just had L3-4 and L4-5 decompression last week by Dr [name] for severe spinal stenosis. Currently on nucynta - but unable to afford refill. Back pain post surgery currently not well controlled for this reason.   Lives in [place] in [place].   Preventative: COLONOSCOPY 11/2012 diverticulosis, rpt 10 yrs [name]) Prostate cancer screening -declinesDRE, agrees to PSA next time Lung cancer screening -not eligible  DEXA - pt states he had this done at [facility]  Flu shotyearly Td 2007 Pneumovax 2012, prevnar 2017 zostavax - ~2014 Shingrix - completed 2019 Advanced directive discussion -has not set up. Son would be HCPOA. Packet provided today. Seat belt use discussed  Sunscreen usediscussed. No changing moles on skin.  Non smoker  Alcohol - none Dentist yearly Eye exam yearly  Widower 2016 - wife passed from ESRD related illness Lives in [place] in [place]. [employer]  Occ: landscaping Edu: HS Activity: no regular exercise  Diet: good water, fruits/vegetables daily     Relevant past medical, surgical, family and social history reviewed and updated as indicated. Interim medical history since our last visit reviewed. Allergies and medications reviewed and updated. Outpatient Medications Prior to Visit  Medication Sig Dispense Refill  . acetaminophen (TYLENOL) 500 MG tablet Take 1,000 mg by mouth 2 (two) times daily. IN THE AFTERNOON & AT NIGHT    . amitriptyline  (ELAVIL) 50 MG tablet Take 2 tablets (100 mg total) by mouth at bedtime. 60 tablet 1  . atorvastatin (LIPITOR) 20 MG tablet Take 1 tablet (20 mg total) by mouth daily. 90 tablet 3  . clotrimazole (LOTRIMIN) 1 % cream Apply 1 application topically 2 (two) times daily. To groin (Patient taking differently: Apply 1 application topically 3 (three) times daily. To groin) 60 g 1  . furosemide (LASIX) 20 MG tablet TAKE 1 TABLET BY MOUTH EVERY DAY AS NEEDED FOR FLUID OR EDEMA (Patient taking differently: Take 20 mg by mouth daily. ) 90 tablet 1  . gabapentin (NEURONTIN) 300 MG capsule TAKE 1 CAPSULE BY MOUTH FOUR TIMES A DAY (Patient taking differently: Take 300-600 mg by mouth See admin instructions. Take 300 mg in the morning, 600 mg in the afternoon, and 300 mg at night) 360 capsule 1  . ketoconazole (NIZORAL) 2 % shampoo Apply 1 application topically 2 (two) times a week. (Patient taking differently: Apply 1 application topically 3 (three) times a week. ) 120 mL 0  . levothyroxine (SYNTHROID, LEVOTHROID) 100 MCG tablet Take 1 tablet (100 mcg total) by mouth daily before breakfast. 90 tablet 1  . metoprolol (TOPROL-XL) 200 MG 24 hr tablet Take 200 mg by mouth daily.    . nitroGLYCERIN (NITROSTAT) 0.4 MG SL tablet Place 0.4 mg under the tongue every 5 (five) minutes as needed for chest pain.    . pantoprazole (PROTONIX) 40 MG tablet TAKE  1 TABLET BY MOUTH EVERY DAY 90 tablet 0  . SUMAtriptan (IMITREX) 100 MG tablet Take 1 tablet (100 mg total) by mouth daily as needed for migraine (repeat in 2 hours if needed). Limit to 2/24 hour period 12 tablet 1  . tapentadol HCl (NUCYNTA) 75 MG tablet Take 1 tablet (75 mg total) by mouth every 6 (six) hours as needed for severe pain. (Patient not taking: Reported on 12/10/2018) 50 tablet 0   No facility-administered medications prior to visit.      Per HPI unless specifically indicated in ROS section below Review of Systems  Constitutional: Negative for activity  change, appetite change, chills, fatigue, fever and unexpected weight change.  HENT: Negative for hearing loss.   Eyes: Negative for visual disturbance.  Respiratory: Positive for cough (mild - after intubation - treating with OTC cough medicine). Negative for chest tightness, shortness of breath and wheezing.   Cardiovascular: Negative for chest pain, palpitations and leg swelling.  Gastrointestinal: Negative for abdominal distention, abdominal pain, blood in stool, constipation, diarrhea, nausea and vomiting.  Genitourinary: Negative for difficulty urinating and hematuria.  Musculoskeletal: Positive for back pain. Negative for arthralgias, myalgias and neck pain.  Skin: Negative for rash.  Neurological: Positive for headaches (mild). Negative for dizziness, seizures and syncope.  Hematological: Negative for adenopathy. Does not bruise/bleed easily.  Psychiatric/Behavioral: Negative for dysphoric mood. The patient is not nervous/anxious.    Objective:    BP 102/68 (BP Location: Right Arm, Patient Position: Sitting, Cuff Size: Large)   Pulse 72   Temp 98.6 F (37 C) (Oral)   Ht 5' 10.5" (1.791 m) Comment: shoes  Wt 277 lb 8 oz (125.9 kg)   SpO2 91%   BMI 39.25 kg/m   Wt Readings from Last 3 Encounters:  12/10/18 277 lb 8 oz (125.9 kg)  12/10/18 277 lb 8 oz (125.9 kg)  12/05/18 278 lb (126.1 kg)    Physical Exam Vitals signs and nursing note reviewed.  Constitutional:      General: He is not in acute distress.    Appearance: Normal appearance. He is well-developed.  HENT:     Head: Normocephalic and atraumatic.     Right Ear: Hearing, tympanic membrane, ear canal and external ear normal.     Left Ear: Hearing, tympanic membrane, ear canal and external ear normal.     Mouth/Throat:     Mouth: Mucous membranes are moist.     Pharynx: Uvula midline. No oropharyngeal exudate or posterior oropharyngeal erythema.  Eyes:     General: No scleral icterus.    Conjunctiva/sclera:  Conjunctivae normal.     Pupils: Pupils are equal, round, and reactive to light.  Neck:     Musculoskeletal: Normal range of motion and neck supple.     Vascular: No carotid bruit.  Cardiovascular:     Rate and Rhythm: Normal rate and regular rhythm.     Pulses: Normal pulses.          Radial pulses are 2+ on the right side and 2+ on the left side.     Heart sounds: Normal heart sounds. No murmur.  Pulmonary:     Effort: Pulmonary effort is normal. No respiratory distress.     Breath sounds: Normal breath sounds. No wheezing, rhonchi or rales.  Abdominal:     General: Abdomen is flat. Bowel sounds are normal. There is no distension.     Palpations: Abdomen is soft. There is no mass.     Tenderness: There  is no abdominal tenderness. There is no guarding or rebound.  Musculoskeletal: Normal range of motion.     Comments: Chronic R knee immobile. Top of incision visible without induration or erythema, dressing c/d/i  Lymphadenopathy:     Cervical: No cervical adenopathy.  Skin:    General: Skin is warm and dry.     Findings: No rash.  Neurological:     Mental Status: He is alert and oriented to person, place, and time.     Comments: CN grossly intact, station and gait intact  Psychiatric:        Behavior: Behavior normal.        Thought Content: Thought content normal.        Judgment: Judgment normal.       Results for orders placed or performed during the hospital encounter of 16/10/96  Basic metabolic panel  Result Value Ref Range   Sodium 136 135 - 145 mmol/L   Potassium 3.8 3.5 - 5.1 mmol/L   Chloride 101 98 - 111 mmol/L   CO2 25 22 - 32 mmol/L   Glucose, Bld 117 (H) 70 - 99 mg/dL   BUN 21 8 - 23 mg/dL   Creatinine, Ser 1.37 (H) 0.61 - 1.24 mg/dL   Calcium 8.5 (L) 8.9 - 10.3 mg/dL   GFR calc non Af Amer 53 (L) >60 mL/min   GFR calc Af Amer >60 >60 mL/min   Anion gap 10 5 - 15  CBC  Result Value Ref Range   WBC 9.3 4.0 - 10.5 K/uL   RBC 4.93 4.22 - 5.81 MIL/uL    Hemoglobin 13.4 13.0 - 17.0 g/dL   HCT 43.7 39.0 - 52.0 %   MCV 88.6 80.0 - 100.0 fL   MCH 27.2 26.0 - 34.0 pg   MCHC 30.7 30.0 - 36.0 g/dL   RDW 14.9 11.5 - 15.5 %   Platelets 245 150 - 400 K/uL   nRBC 0.0 0.0 - 0.2 %   Assessment & Plan:   Problem List Items Addressed This Visit    Spinal stenosis of lumbar region without neurogenic claudication    S/p 2 level decompression by Dr [name] (11/2018), pt endorses significant improvement in leg pain.       Severe obesity (BMI 35.0-39.9) with comorbidity (HCC)   Renal insufficiency    CMP pending. Discussed importance of adequate hydration status.       Hypothyroidism    Rpt labs today pending. Levothyroxine decreased to 167mcg last month.       History of CVA in adulthood   Health maintenance examination - Primary    Preventative protocols reviewed and updated unless pt declined. Discussed healthy diet and lifestyle.       Dyslipidemia    Chronic, atorvastatin started 10/2018. Not yet due for rpt FLP      Chronic pain syndrome    Continue amitriptyline 100mg  QHS.  Discussed tylenol dosing.  He feels recent back surgery has significantly improved leg pains he was having.       Chronic leg pain    Endorses significant improvement after recent lumbar stenosis decompression.       CHF (congestive heart failure) (HCC)    Continues lasix 20mg  daily.       Acute post-operative pain    Uncontrolled as he ran out of nucynta and was unable to afford refill. He will call ortho office today to discuss pain control options. In interim, discussed scheduling tylenol 1000mg  TID with meals until  he gets new post-op pain regimen through ortho.           No orders of the defined types were placed in this encounter.  No orders of the defined types were placed in this encounter.   Follow up plan: Return in about 3 months (around 03/10/2019) for follow up visit.  [name], MD

## 2018-12-10 NOTE — Assessment & Plan Note (Signed)
Preventative protocols reviewed and updated unless pt declined. Discussed healthy diet and lifestyle.  

## 2018-12-10 NOTE — Telephone Encounter (Signed)
Pt had surgery on 12/05/18 with Dr. [name]. Requesting something else for pain. Says he can't afford the Nucynta that was prescribed.

## 2018-12-10 NOTE — Assessment & Plan Note (Addendum)
Continue amitriptyline 100mg  QHS.  Discussed tylenol dosing.  He feels recent back surgery has significantly improved leg pains he was having.

## 2018-12-10 NOTE — Assessment & Plan Note (Signed)
Uncontrolled as he ran out of nucynta and was unable to afford refill. He will call ortho office today to discuss pain control options. In interim, discussed scheduling tylenol 1000mg  TID with meals until he gets new post-op pain regimen through ortho.

## 2018-12-11 LAB — T4, FREE: Free T4: 0.93 ng/dL (ref 0.60–1.60)

## 2018-12-11 LAB — TSH: TSH: 0.87 u[IU]/mL (ref 0.35–4.50)

## 2018-12-11 NOTE — Telephone Encounter (Signed)
IC patient and advised.  

## 2018-12-12 ENCOUNTER — Encounter (INDEPENDENT_AMBULATORY_CARE_PROVIDER_SITE_OTHER): Payer: Self-pay | Admitting: Orthopaedic Surgery

## 2018-12-12 ENCOUNTER — Ambulatory Visit (INDEPENDENT_AMBULATORY_CARE_PROVIDER_SITE_OTHER): Payer: PPO | Admitting: Orthopaedic Surgery

## 2018-12-12 VITALS — BP 113/70 | HR 64 | Ht 70.5 in | Wt 277.0 lb

## 2018-12-12 DIAGNOSIS — Z9889 Other specified postprocedural states: Secondary | ICD-10-CM

## 2018-12-12 NOTE — Progress Notes (Signed)
Post-Op Visit Note   Patient: Nicholas [name]           Date of Birth: [birth year]           MRN: [id number] Visit Date: 12/12/2018 PCP: [name], MD   Assessment & Plan:  Chief Complaint:  Chief Complaint  Patient presents with  . Lower Back - Routine Post Op    12/05/2018 L3-4, L4-5 Decompression   Visit Diagnoses:  1. Status post lumbar spine operative procedure for decompression of spinal cord     Plan: Post lumbar decompression his leg feels great he states he still has pain where the incision is present staples are in good position without drainage.  New dressing applied.  Return in 1 week for planned staple removal and Steri-Strips.  Follow-Up Instructions: Return in about 1 week (around 12/19/2018).   Orders:  No orders of the defined types were placed in this encounter.  No orders of the defined types were placed in this encounter.   Imaging: No results found.  PMFS History: Patient Active Problem List   Diagnosis Date Noted  . Acute post-operative pain 12/10/2018  . Dyslipidemia 10/19/2018  . Foot deformity 10/19/2018  . Spinal stenosis of lumbar region without neurogenic claudication 10/01/2018  . Facial rash 05/26/2018  . Shoulder arthritis 05/13/2018  . Status post reverse total replacement of right shoulder 05/13/2018  . Pain and swelling of lower leg, right 03/20/2018  . Epigastric pain 02/26/2018  . CHF (congestive heart failure) ([place]) 12/27/2017  . OSA (obstructive sleep apnea) 12/27/2017  . Health maintenance examination 11/25/2017  . Renal insufficiency 10/01/2017  . Fatty liver 10/01/2017  . Status post reverse total replacement of left shoulder 09/03/2017  . Bilateral shoulder pain 05/24/2017  . History of CVA in adulthood 03/08/2017  . S/P total knee arthroplasty, left 01/30/2017  . Arthritis of knee 01/15/2017  . Skin rash 12/10/2016  . Medicare annual wellness visit, initial 10/05/2016  . Advanced care planning/counseling  discussion 10/05/2016  . Severe obesity (BMI 35.0-39.9) with comorbidity ([place]) 07/19/2016  . Hypothyroidism   . IDA (iron deficiency anemia) 04/23/2016  . Chronic pain syndrome 04/23/2016  . Inflammatory arthritis 04/23/2016  . Chronic leg pain 04/23/2016  . Lesion of palate 04/23/2016  . Grieving 04/23/2016  . GERD (gastroesophageal reflux disease)   . Duodenitis determined by biopsy 02/13/2016  . Diverticulosis of colon without hemorrhage 01/31/2016  . Chest pain 01/11/2012  . Migraine 01/11/2012  . History of methicillin resistant staphylococcus aureus (MRSA) 07/04/2010  . DEGENERATIVE DISC DISEASE 07/04/2010  . History of total right knee replacement 07/04/2010  . Primary osteoarthritis, right shoulder 06/09/2007  . Personal history of colonic adenoma 06/01/2003   Past Medical History:  Diagnosis Date  . Acute kidney failure 08/2008   "cleared up"  no problems since  . Allergy   . Anxiety   . Arthritis   . Asthma    ?of this no inhaler  . Blood transfusion   . CHF (congestive heart failure) ([place])    ?of this, pt denies  . CLOSTRIDIUM DIFFICILE COLITIS 07/04/2010   Annotation: 12/09, 2/10 Qualifier: Diagnosis of  By: [name], [name]    . Depression with anxiety   . Duodenitis determined by biopsy 02/2016   peptic likely due to aleve (erosive gastropathy with duodenal erosions)  . ECZEMA 07/04/2010   Qualifier: Diagnosis of  By: [name], [name]    . Elevated liver enzymes   . Full dentures   .  GERD (gastroesophageal reflux disease)   . Heart attack ([place])    08/2008 (likely demand ischemia in the setting of MSSA sepsis/R TKA  infection)10-2008  . History of hiatal hernia   . History of stomach ulcers   . Hypothyroidism   . Lower GI bleeding   . MALAR AND MAXILLARY BONES CLOSED FRACTURE 07/04/2010   Annotation: ORIF Qualifier: Diagnosis of  By: [name], [name]    . Migraine    "definitely"  . MRSA (methicillin resistant Staphylococcus aureus)    in leg, had to  place steel rod in leg  . Personal history of colonic adenoma 06/01/2003  . PFO (patent foramen ovale)    small PFO by 08/2008 TEE  . Pneumonia 08/2008   "while in ICU"  . Seizures ([place]) 2009   "long time ago"    . Sleep apnea    does not wear CPAP  . Stroke [facility]) 2010   unable to complete sentences at times  . Wears glasses     Family History  Problem Relation Age of Onset  . Coronary artery disease Father 44  . Stroke Father   . CAD Mother 33       stents  . Breast cancer Sister   . Diabetes Brother   . Diabetes Sister   . Colon cancer Neg Hx   . Stomach cancer Neg Hx     Past Surgical History:  Procedure Laterality Date  . anterior  nerve transposition  07/2009   left ulnar nerve  . antibiotic spacer exchange  11/2008; 08/2006   right knee  . ARTHROTOMY  08/2008   right knee w/I&D  . CARDIOVASCULAR STRESS TEST  2013   stress EKG - negative for ischemia, 4 min 7.3 METs, normal blood pressure response  . CATARACT EXTRACTION    . COLONOSCOPY  11/2012   diverticulosis, rpt 10 yrs [name])  . ESOPHAGOGASTRODUODENOSCOPY  02/2016   erosive gastropathy with duodenal erosions [name])  . HARDWARE REMOVAL  04/2006   right knee w/antibiotic spacers placed  . INGUINAL HERNIA REPAIR  early 1990's   bilateral  . JOINT REPLACEMENT Bilateral   . KNEE ARTHROSCOPY  10/2001   right  . KNEE FUSION  03/2009   right knee removal; antibiotic spacers;   . LEFT HEART CATH AND CORONARY ANGIOGRAPHY N/A 02/04/2018    WNL (Patwardhan, Reynold Bowen, MD)  . LUMBAR LAMINECTOMY/DECOMPRESSION MICRODISCECTOMY N/A 12/05/2018   Procedure: LUMBAR THREE TO FOUR AND LUMBAR FOUR TO FIVE DECOMPRESSION;  Surgeon: [name], MD;  Location: [place];  Service: Orthopedics;  Laterality: N/A;  . MULTIPLE TOOTH EXTRACTIONS    . REPLACEMENT TOTAL KNEE  08/2008; 09/2001   right  . REVERSE SHOULDER ARTHROPLASTY Left 09/03/2017   Procedure: REVERSE SHOULDER ARTHROPLASTY;  Surgeon: Meredith Pel, MD;  Location:  [place];  Service: Orthopedics;  Laterality: Left;  . REVERSE SHOULDER ARTHROPLASTY Right 05/13/2018   Procedure: RIGHT REVERSE SHOULDER ARTHROPLASTY;  Surgeon: Meredith Pel, MD;  Location: [place];  Service: Orthopedics;  Laterality: Right;  . rod placement Right   . SYNOVECTOMY  06/2005   debridement, liner exchange right knee  . TOE AMPUTATION Left 08/2008   great toe - osteomyelitis (staph infection)  . TOE AMPUTATION Left 04/2016   2nd toe [name]  . TONSILLECTOMY    . TOTAL KNEE ARTHROPLASTY Left 01/15/2017  . TOTAL KNEE ARTHROPLASTY Left 01/15/2017   Procedure: TOTAL KNEE ARTHROPLASTY;  Surgeon: Meredith Pel, MD;  Location: [place];  Service: Orthopedics;  Laterality: Left;   Social History   Occupational History  . Occupation: retired  Tobacco Use  . Smoking status: Never Smoker  . Smokeless tobacco: Never Used  Substance and Sexual Activity  . Alcohol use: No    Alcohol/week: 0.0 standard drinks    Comment: "I abused alcohol; last drink  ~ 2005"  . Drug use: No  . Sexual activity: Never

## 2018-12-16 ENCOUNTER — Other Ambulatory Visit: Payer: Self-pay | Admitting: Family Medicine

## 2018-12-16 NOTE — Telephone Encounter (Signed)
Gabapentin Last filled:  09/23/18, #360 Last OV:  12/10/18, CPE Pt 2 Next OV:  03/11/19, chronic pain f/u

## 2018-12-19 ENCOUNTER — Ambulatory Visit (INDEPENDENT_AMBULATORY_CARE_PROVIDER_SITE_OTHER): Payer: PPO | Admitting: Orthopaedic Surgery

## 2018-12-19 ENCOUNTER — Encounter (INDEPENDENT_AMBULATORY_CARE_PROVIDER_SITE_OTHER): Payer: Self-pay | Admitting: Orthopaedic Surgery

## 2018-12-19 VITALS — BP 120/66 | HR 53 | Ht 69.0 in | Wt 275.0 lb

## 2018-12-19 DIAGNOSIS — Z9889 Other specified postprocedural states: Secondary | ICD-10-CM

## 2018-12-19 NOTE — Progress Notes (Deleted)
j

## 2018-12-19 NOTE — Progress Notes (Signed)
Post-Op Visit Note   Patient: Nicholas [name]           Date of Birth: 03-27-1951           MRN: [id number] Visit Date: 12/19/2018 PCP: [name], MD   Assessment & Plan: Post decompression L3-4 L4-5.  Staples removed Steri-Strips applied.  He is walking well is gotten good relief of pain and states he feels better than he did before the surgery.  I will check him back again in 1 month he will continue with his walking program.  Chief Complaint:  Chief Complaint  Patient presents with  . Lower Back - Follow-up    12/05/2018 L3-4, L4-5 Decompression   Visit Diagnoses:  1. Status post lumbar spine surgery for decompression of spinal cord     Plan: Return 1 month.  He can use some Tylenol or Aleve if needed for pain.  Follow-Up Instructions: Return in about 1 month (around 01/19/2019).   Orders:  No orders of the defined types were placed in this encounter.  No orders of the defined types were placed in this encounter.   Imaging: No results found.  PMFS History: Patient Active Problem List   Diagnosis Date Noted  . Status post lumbar spine operative procedure for decompression of spinal cord 12/12/2018  . Acute post-operative pain 12/10/2018  . Dyslipidemia 10/19/2018  . Foot deformity 10/19/2018  . Spinal stenosis of lumbar region without neurogenic claudication 10/01/2018  . Facial rash 05/26/2018  . Shoulder arthritis 05/13/2018  . Status post reverse total replacement of right shoulder 05/13/2018  . Pain and swelling of lower leg, right 03/20/2018  . Epigastric pain 02/26/2018  . CHF (congestive heart failure) ([place]) 12/27/2017  . OSA (obstructive sleep apnea) 12/27/2017  . Health maintenance examination 11/25/2017  . Renal insufficiency 10/01/2017  . Fatty liver 10/01/2017  . Status post reverse total replacement of left shoulder 09/03/2017  . Bilateral shoulder pain 05/24/2017  . History of CVA in adulthood 03/08/2017  . S/P total knee arthroplasty, left  01/30/2017  . Arthritis of knee 01/15/2017  . Skin rash 12/10/2016  . Medicare annual wellness visit, initial 10/05/2016  . Advanced care planning/counseling discussion 10/05/2016  . Severe obesity (BMI 35.0-39.9) with comorbidity ([place]) 07/19/2016  . Hypothyroidism   . IDA (iron deficiency anemia) 04/23/2016  . Chronic pain syndrome 04/23/2016  . Inflammatory arthritis 04/23/2016  . Chronic leg pain 04/23/2016  . Lesion of palate 04/23/2016  . Grieving 04/23/2016  . GERD (gastroesophageal reflux disease)   . Duodenitis determined by biopsy 02/13/2016  . Diverticulosis of colon without hemorrhage 01/31/2016  . Chest pain 01/11/2012  . Migraine 01/11/2012  . History of methicillin resistant staphylococcus aureus (MRSA) 07/04/2010  . DEGENERATIVE DISC DISEASE 07/04/2010  . History of total right knee replacement 07/04/2010  . Primary osteoarthritis, right shoulder 06/09/2007  . Personal history of colonic adenoma 06/01/2003   Past Medical History:  Diagnosis Date  . Acute kidney failure 08/2008   "cleared up"  no problems since  . Allergy   . Anxiety   . Arthritis   . Asthma    ?of this no inhaler  . Blood transfusion   . CHF (congestive heart failure) ([place])    ?of this, pt denies  . CLOSTRIDIUM DIFFICILE COLITIS 07/04/2010   Annotation: 12/09, 2/10 Qualifier: Diagnosis of  By: [name], [name]    . Depression with anxiety   . Duodenitis determined by biopsy 02/2016   peptic likely due to aleve (  erosive gastropathy with duodenal erosions)  . ECZEMA 07/04/2010   Qualifier: Diagnosis of  By: [name], [name]    . Elevated liver enzymes   . Full dentures   . GERD (gastroesophageal reflux disease)   . Heart attack ([place])    08/2008 (likely demand ischemia in the setting of MSSA sepsis/R TKA  infection)10-2008  . History of hiatal hernia   . History of stomach ulcers   . Hypothyroidism   . Lower GI bleeding   . MALAR AND MAXILLARY BONES CLOSED FRACTURE 07/04/2010    Annotation: ORIF Qualifier: Diagnosis of  By: [name], [name]    . Migraine    "definitely"  . MRSA (methicillin resistant Staphylococcus aureus)    in leg, had to place steel rod in leg  . Personal history of colonic adenoma 06/01/2003  . PFO (patent foramen ovale)    small PFO by 08/2008 TEE  . Pneumonia 08/2008   "while in ICU"  . Seizures ([place]) 2009   "long time ago"    . Sleep apnea    does not wear CPAP  . Stroke [facility]) 2010   unable to complete sentences at times  . Wears glasses     Family History  Problem Relation Age of Onset  . Coronary artery disease Father 25  . Stroke Father   . CAD Mother 87       stents  . Breast cancer Sister   . Diabetes Brother   . Diabetes Sister   . Colon cancer Neg Hx   . Stomach cancer Neg Hx     Past Surgical History:  Procedure Laterality Date  . anterior  nerve transposition  07/2009   left ulnar nerve  . antibiotic spacer exchange  11/2008; 08/2006   right knee  . ARTHROTOMY  08/2008   right knee w/I&D  . CARDIOVASCULAR STRESS TEST  2013   stress EKG - negative for ischemia, 4 min 7.3 METs, normal blood pressure response  . CATARACT EXTRACTION    . COLONOSCOPY  11/2012   diverticulosis, rpt 10 yrs [name])  . ESOPHAGOGASTRODUODENOSCOPY  02/2016   erosive gastropathy with duodenal erosions [name])  . HARDWARE REMOVAL  04/2006   right knee w/antibiotic spacers placed  . INGUINAL HERNIA REPAIR  early 1990's   bilateral  . JOINT REPLACEMENT Bilateral   . KNEE ARTHROSCOPY  10/2001   right  . KNEE FUSION  03/2009   right knee removal; antibiotic spacers;   . LEFT HEART CATH AND CORONARY ANGIOGRAPHY N/A 02/04/2018    WNL (Patwardhan, Reynold Bowen, MD)  . LUMBAR LAMINECTOMY/DECOMPRESSION MICRODISCECTOMY N/A 12/05/2018   Procedure: LUMBAR THREE TO FOUR AND LUMBAR FOUR TO FIVE DECOMPRESSION;  Surgeon: [name], MD;  Location: [place];  Service: Orthopedics;  Laterality: N/A;  . MULTIPLE TOOTH EXTRACTIONS    . REPLACEMENT TOTAL  KNEE  08/2008; 09/2001   right  . REVERSE SHOULDER ARTHROPLASTY Left 09/03/2017   Procedure: REVERSE SHOULDER ARTHROPLASTY;  Surgeon: Meredith Pel, MD;  Location: [place];  Service: Orthopedics;  Laterality: Left;  . REVERSE SHOULDER ARTHROPLASTY Right 05/13/2018   Procedure: RIGHT REVERSE SHOULDER ARTHROPLASTY;  Surgeon: Meredith Pel, MD;  Location: [place];  Service: Orthopedics;  Laterality: Right;  . rod placement Right   . SYNOVECTOMY  06/2005   debridement, liner exchange right knee  . TOE AMPUTATION Left 08/2008   great toe - osteomyelitis (staph infection)  . TOE AMPUTATION Left 04/2016   2nd toe [name]  .  TONSILLECTOMY    . TOTAL KNEE ARTHROPLASTY Left 01/15/2017  . TOTAL KNEE ARTHROPLASTY Left 01/15/2017   Procedure: TOTAL KNEE ARTHROPLASTY;  Surgeon: Meredith Pel, MD;  Location: [place];  Service: Orthopedics;  Laterality: Left;   Social History   Occupational History  . Occupation: retired  Tobacco Use  . Smoking status: Never Smoker  . Smokeless tobacco: Never Used  Substance and Sexual Activity  . Alcohol use: No    Alcohol/week: 0.0 standard drinks    Comment: "I abused alcohol; last drink  ~ 2005"  . Drug use: No  . Sexual activity: Never

## 2019-01-19 ENCOUNTER — Telehealth (INDEPENDENT_AMBULATORY_CARE_PROVIDER_SITE_OTHER): Payer: Self-pay | Admitting: Radiology

## 2019-01-19 NOTE — Telephone Encounter (Signed)
I called and confirmed patient appointment on 01/20/2019 at 1:15p.  Patient answered "No" to all COVID-19 screening questions.

## 2019-01-20 ENCOUNTER — Other Ambulatory Visit: Payer: Self-pay

## 2019-01-20 ENCOUNTER — Encounter (INDEPENDENT_AMBULATORY_CARE_PROVIDER_SITE_OTHER): Payer: Self-pay | Admitting: Orthopaedic Surgery

## 2019-01-20 ENCOUNTER — Ambulatory Visit (INDEPENDENT_AMBULATORY_CARE_PROVIDER_SITE_OTHER): Payer: PPO | Admitting: Orthopaedic Surgery

## 2019-01-20 VITALS — Ht 69.0 in | Wt 280.0 lb

## 2019-01-20 DIAGNOSIS — Z9889 Other specified postprocedural states: Secondary | ICD-10-CM

## 2019-01-20 DIAGNOSIS — M25572 Pain in left ankle and joints of left foot: Secondary | ICD-10-CM

## 2019-01-20 DIAGNOSIS — L84 Corns and callosities: Secondary | ICD-10-CM

## 2019-01-20 NOTE — Progress Notes (Signed)
Office Visit Note   Patient: Nicholas [name]           Date of Birth: 03/19/1951           MRN: [id number] Visit Date: 01/20/2019              Requested by: [name], MD [place], [place] [address] PCP: [name], MD   Assessment & Plan: Visit Diagnoses:  1. Callus of foot   2. Status post lumbar spine operative procedure for decompression of spinal cord     Plan: Callus trimmed left foot  patient's request.  Foot care was discussed and reviewed.  Return PRN.  He is happy with the results of the lumbar surgery.  Follow-Up Instructions: No follow-ups on file.   Orders:  No orders of the defined types were placed in this encounter.  No orders of the defined types were placed in this encounter.     Procedures: No procedures performed   Clinical Data: No additional findings.   Subjective: Chief Complaint  Patient presents with  . Lower Back - Follow-up    12/05/2018 L3-4, L4-5 Decompression  . Left Foot - Pain    HPI patient turns post decompression lumbar L3-4 L4-5.  States his back is doing great he is had problems with the left foot callus under the first metatarsal head after having great toe amputation in the past with his diabetes.  He is requesting callus trimmed today since he is in pain with shoe wearing.  His claudication symptoms have been relieved and he is happy with the results of the lumbar surgery.  Review of Systems updated unchanged from surgery.   Objective: Vital Signs: Ht 5\' 9"  (1.753 m)   Wt 280 lb (127 kg)   BMI 41.35 kg/m   Physical Exam Constitutional:      Appearance: He is well-developed.  HENT:     Head: Normocephalic and atraumatic.  Eyes:     Pupils: Pupils are equal, round, and reactive to light.  Neck:     Thyroid: No thyromegaly.     Trachea: No tracheal deviation.  Cardiovascular:     Rate and Rhythm: Normal rate.  Pulmonary:     Effort: Pulmonary effort is normal.     Breath sounds:  No wheezing.  Abdominal:     General: Bowel sounds are normal.     Palpations: Abdomen is soft.  Skin:    General: Skin is warm and dry.     Capillary Refill: Capillary refill takes less than 2 seconds.  Neurological:     Mental Status: He is alert and oriented to person, place, and time.  Psychiatric:        Behavior: Behavior normal.        Thought Content: Thought content normal.        Judgment: Judgment normal.     Ortho Exam well-healed lumbar incision fused right knee.  Well-healed amputation left great toe with callus with blood blister first metatarsal head.  No exposed ulcer is present.  Limitation of external rotation and flexion of his knee he is not able to figure 4 is leg.  Specialty Comments:  No specialty comments available.  Imaging: No results found.   PMFS History: Patient Active Problem List   Diagnosis Date Noted  . Status post lumbar spine operative procedure for decompression of spinal cord 12/12/2018  . Acute post-operative pain 12/10/2018  . Dyslipidemia 10/19/2018  . Foot deformity 10/19/2018  .  Spinal stenosis of lumbar region without neurogenic claudication 10/01/2018  . Facial rash 05/26/2018  . Shoulder arthritis 05/13/2018  . Status post reverse total replacement of right shoulder 05/13/2018  . Pain and swelling of lower leg, right 03/20/2018  . Epigastric pain 02/26/2018  . CHF (congestive heart failure) ([place]) 12/27/2017  . OSA (obstructive sleep apnea) 12/27/2017  . Health maintenance examination 11/25/2017  . Renal insufficiency 10/01/2017  . Fatty liver 10/01/2017  . Status post reverse total replacement of left shoulder 09/03/2017  . Bilateral shoulder pain 05/24/2017  . History of CVA in adulthood 03/08/2017  . S/P total knee arthroplasty, left 01/30/2017  . Arthritis of knee 01/15/2017  . Skin rash 12/10/2016  . Medicare annual wellness visit, initial 10/05/2016  . Advanced care planning/counseling discussion 10/05/2016  .  Severe obesity (BMI 35.0-39.9) with comorbidity ([place]) 07/19/2016  . Hypothyroidism   . IDA (iron deficiency anemia) 04/23/2016  . Chronic pain syndrome 04/23/2016  . Inflammatory arthritis 04/23/2016  . Chronic leg pain 04/23/2016  . Lesion of palate 04/23/2016  . Grieving 04/23/2016  . GERD (gastroesophageal reflux disease)   . Duodenitis determined by biopsy 02/13/2016  . Diverticulosis of colon without hemorrhage 01/31/2016  . Chest pain 01/11/2012  . Migraine 01/11/2012  . History of methicillin resistant staphylococcus aureus (MRSA) 07/04/2010  . DEGENERATIVE DISC DISEASE 07/04/2010  . History of total right knee replacement 07/04/2010  . Primary osteoarthritis, right shoulder 06/09/2007  . Personal history of colonic adenoma 06/01/2003   Past Medical History:  Diagnosis Date  . Acute kidney failure 08/2008   "cleared up"  no problems since  . Allergy   . Anxiety   . Arthritis   . Asthma    ?of this no inhaler  . Blood transfusion   . CHF (congestive heart failure) ([place])    ?of this, pt denies  . CLOSTRIDIUM DIFFICILE COLITIS 07/04/2010   Annotation: 12/09, 2/10 Qualifier: Diagnosis of  By: [name], [name]    . Depression with anxiety   . Duodenitis determined by biopsy 02/2016   peptic likely due to aleve (erosive gastropathy with duodenal erosions)  . ECZEMA 07/04/2010   Qualifier: Diagnosis of  By: [name], [name]    . Elevated liver enzymes   . Full dentures   . GERD (gastroesophageal reflux disease)   . Heart attack ([place])    08/2008 (likely demand ischemia in the setting of MSSA sepsis/R TKA  infection)10-2008  . History of hiatal hernia   . History of stomach ulcers   . Hypothyroidism   . Lower GI bleeding   . MALAR AND MAXILLARY BONES CLOSED FRACTURE 07/04/2010   Annotation: ORIF Qualifier: Diagnosis of  By: [name], [name]    . Migraine    "definitely"  . MRSA (methicillin resistant Staphylococcus aureus)    in leg, had to place steel rod in leg  .  Personal history of colonic adenoma 06/01/2003  . PFO (patent foramen ovale)    small PFO by 08/2008 TEE  . Pneumonia 08/2008   "while in ICU"  . Seizures ([place]) 2009   "long time ago"    . Sleep apnea    does not wear CPAP  . Stroke [facility]) 2010   unable to complete sentences at times  . Wears glasses     Family History  Problem Relation Age of Onset  . Coronary artery disease Father 68  . Stroke Father   . CAD Mother 70       stents  .  Breast cancer Sister   . Diabetes Brother   . Diabetes Sister   . Colon cancer Neg Hx   . Stomach cancer Neg Hx     Past Surgical History:  Procedure Laterality Date  . anterior  nerve transposition  07/2009   left ulnar nerve  . antibiotic spacer exchange  11/2008; 08/2006   right knee  . ARTHROTOMY  08/2008   right knee w/I&D  . CARDIOVASCULAR STRESS TEST  2013   stress EKG - negative for ischemia, 4 min 7.3 METs, normal blood pressure response  . CATARACT EXTRACTION    . COLONOSCOPY  11/2012   diverticulosis, rpt 10 yrs [name])  . ESOPHAGOGASTRODUODENOSCOPY  02/2016   erosive gastropathy with duodenal erosions [name])  . HARDWARE REMOVAL  04/2006   right knee w/antibiotic spacers placed  . INGUINAL HERNIA REPAIR  early 1990's   bilateral  . JOINT REPLACEMENT Bilateral   . KNEE ARTHROSCOPY  10/2001   right  . KNEE FUSION  03/2009   right knee removal; antibiotic spacers;   . LEFT HEART CATH AND CORONARY ANGIOGRAPHY N/A 02/04/2018    WNL (Patwardhan, Reynold Bowen, MD)  . LUMBAR LAMINECTOMY/DECOMPRESSION MICRODISCECTOMY N/A 12/05/2018   Procedure: LUMBAR THREE TO FOUR AND LUMBAR FOUR TO FIVE DECOMPRESSION;  Surgeon: [name], MD;  Location: [place];  Service: Orthopedics;  Laterality: N/A;  . MULTIPLE TOOTH EXTRACTIONS    . REPLACEMENT TOTAL KNEE  08/2008; 09/2001   right  . REVERSE SHOULDER ARTHROPLASTY Left 09/03/2017   Procedure: REVERSE SHOULDER ARTHROPLASTY;  Surgeon: Meredith Pel, MD;  Location: [place];  Service:  Orthopedics;  Laterality: Left;  . REVERSE SHOULDER ARTHROPLASTY Right 05/13/2018   Procedure: RIGHT REVERSE SHOULDER ARTHROPLASTY;  Surgeon: Meredith Pel, MD;  Location: [place];  Service: Orthopedics;  Laterality: Right;  . rod placement Right   . SYNOVECTOMY  06/2005   debridement, liner exchange right knee  . TOE AMPUTATION Left 08/2008   great toe - osteomyelitis (staph infection)  . TOE AMPUTATION Left 04/2016   2nd toe [name]  . TONSILLECTOMY    . TOTAL KNEE ARTHROPLASTY Left 01/15/2017  . TOTAL KNEE ARTHROPLASTY Left 01/15/2017   Procedure: TOTAL KNEE ARTHROPLASTY;  Surgeon: Meredith Pel, MD;  Location: [place];  Service: Orthopedics;  Laterality: Left;   Social History   Occupational History  . Occupation: retired  Tobacco Use  . Smoking status: Never Smoker  . Smokeless tobacco: Never Used  Substance and Sexual Activity  . Alcohol use: No    Alcohol/week: 0.0 standard drinks    Comment: "I abused alcohol; last drink  ~ 2005"  . Drug use: No  . Sexual activity: Never

## 2019-01-21 ENCOUNTER — Other Ambulatory Visit: Payer: Self-pay | Admitting: Cardiology

## 2019-01-21 DIAGNOSIS — I209 Angina pectoris, unspecified: Secondary | ICD-10-CM

## 2019-01-22 ENCOUNTER — Other Ambulatory Visit: Payer: Self-pay

## 2019-01-25 NOTE — Progress Notes (Signed)
I reviewed health advisor's note, was available for consultation, and agree with documentation and plan.  

## 2019-01-26 ENCOUNTER — Other Ambulatory Visit: Payer: Self-pay | Admitting: *Deleted

## 2019-01-26 NOTE — Patient Outreach (Signed)
[facility]) Care Management  01/26/2019  NAJEH CREDIT 1951/09/10 [id number]   Telephone Screen  Referral Date: 01/22/19 Referral Source: EMMI prevent referral  Referral Reason: Please engage patient for [place] Management services. Pt engagement tool score 13 ( Fall x 1, hospital x 1  Insurance: HTA   Outreach attempt # 1 No answer. [facility] RN CM left HIPAA compliant voicemail message along with CM's contact info.   Plan: [facility] RN CM sent an unsuccessful outreach letter and scheduled this patient for another call attempt within 4 business days  Kimberly L. Lavina Hamman, RN, BSN, [place] Coordinator Office number [phone] Mobile number [phone]  Main [facility] number [phone] Fax number [phone]

## 2019-01-27 ENCOUNTER — Other Ambulatory Visit: Payer: Self-pay | Admitting: *Deleted

## 2019-01-27 NOTE — Patient Outreach (Signed)
[facility]) Care Management  01/27/2019  Nicholas [name] November 27, 1950 [id number]   Telephone Screen  Referral Date: 01/22/19 Referral Source: EMMI prevent referral  Referral Reason: Please engage patient for [place] Management services. Pt engagement tool score 13 ( Fall x 1, hospital x 1  Insurance: HTA   Outreach attempt # 2 No answer. [facility] RN CM left HIPAA compliant voicemail message along with CM's contact info.   Plan: [facility] RN CM sent an unsuccessful outreach letter on 01/26/19. Left voice messages on 01/26/19 and 01/27/19  and scheduled this patient for a final call attempt within 4 business days  [place] L. Lavina Hamman, RN, BSN, [place] Coordinator Office number [phone] Mobile number [phone]  Main [facility] number [phone] Fax number [phone]

## 2019-01-29 ENCOUNTER — Other Ambulatory Visit: Payer: Self-pay | Admitting: *Deleted

## 2019-01-29 NOTE — Patient Outreach (Signed)
[facility]) Care Management  01/29/2019  Nicholas [name] [birth year] [id number]   Telephone Screen  Referral Date:01/22/19 Referral Source:EMMI prevent referral Referral Reason:Please engage patient for [place] Management services.Pt engagement tool score 13 ( Fall x 1, hospital x 1 Insurance:HTA   Outreach attempt # 3 No answer. [facility] RN CM left HIPAA compliant voicemail message along with CM's contact info.   Plan: [facility] RN CM sent an unsuccessful outreach letter on 01/26/19. Left voice messages on 01/26/19, 01/27/19 and 01/30/19 and scheduled this patient for case closure if no return call from this patient within the total of 10 business days from the time of the first call attempt  [place]. Lavina Hamman, RN, BSN, [place] Coordinator Office number [phone] Mobile number [phone]  Main [facility] number [phone] Fax number [phone]

## 2019-02-08 ENCOUNTER — Other Ambulatory Visit: Payer: Self-pay | Admitting: Family Medicine

## 2019-02-08 DIAGNOSIS — K2960 Other gastritis without bleeding: Secondary | ICD-10-CM

## 2019-02-08 DIAGNOSIS — K269 Duodenal ulcer, unspecified as acute or chronic, without hemorrhage or perforation: Secondary | ICD-10-CM

## 2019-02-09 ENCOUNTER — Telehealth: Payer: Self-pay | Admitting: Family Medicine

## 2019-02-09 ENCOUNTER — Other Ambulatory Visit: Payer: Self-pay | Admitting: Family Medicine

## 2019-02-09 NOTE — Telephone Encounter (Signed)
error 

## 2019-02-10 ENCOUNTER — Encounter: Payer: Self-pay | Admitting: Family Medicine

## 2019-02-10 ENCOUNTER — Ambulatory Visit (INDEPENDENT_AMBULATORY_CARE_PROVIDER_SITE_OTHER): Payer: PPO | Admitting: Family Medicine

## 2019-02-10 VITALS — BP 143/91 | HR 99 | Temp 97.3°F | Ht 70.5 in | Wt 274.0 lb

## 2019-02-10 DIAGNOSIS — Z9889 Other specified postprocedural states: Secondary | ICD-10-CM | POA: Diagnosis not present

## 2019-02-10 DIAGNOSIS — G4700 Insomnia, unspecified: Secondary | ICD-10-CM

## 2019-02-10 DIAGNOSIS — E039 Hypothyroidism, unspecified: Secondary | ICD-10-CM

## 2019-02-10 MED ORDER — LEVOTHYROXINE SODIUM 100 MCG PO TABS: 100.0000 ug | ORAL_TABLET | Freq: Every day | ORAL | 3 refills | Status: DC

## 2019-02-10 MED ORDER — SUMATRIPTAN SUCCINATE 100 MG PO TABS: 100.0000 mg | ORAL_TABLET | Freq: Every day | ORAL | 2 refills | Status: DC | PRN

## 2019-02-10 MED ORDER — AMITRIPTYLINE HCL 50 MG PO TABS: 50.0000 mg | ORAL_TABLET | Freq: Every day | ORAL | 6 refills | Status: DC

## 2019-02-10 MED ORDER — METOPROLOL SUCCINATE ER 100 MG PO TB24: 100.0000 mg | ORAL_TABLET | Freq: Every day | ORAL | 6 refills | Status: DC

## 2019-02-10 NOTE — Progress Notes (Signed)
Virtual visit completed through [employer]. Due to national recommendations of social distancing due to [place] 19, a virtual visit is felt to be most appropriate for this patient at this time. Interactive audio and video telecommunications were attempted between myself and Nicholas [name], however failed due to patient not having access to video capability. We continued and completed visit with audio only.   Patient location: home Provider location: [place] at [facility], office If any vitals were documented, they were collected by patient at home unless specified below.    BP (!) 143/91 (BP Location: Right Arm, Patient Position: Sitting, Cuff Size: Large)   Pulse 99   Temp (!) 97.3 F (36.3 C) (Oral)   Ht 5' 10.5" (1.791 m)   Wt 274 lb (124.3 kg)   BMI 38.76 kg/m    CC: discuss meds Subjective:    Patient ID: Nicholas [name], male    DOB: July 12, 1951, 68 y.o.   MRN: [id number]  HPI: Nicholas [name] is a 68 y.o. male presenting on 02/10/2019 for Discuss Medication (Wants to discuss amitriptyline and levothyrooxine. )   Had run out of amitriptyline and levothyroxine and metoprolol.  Requests refill of levothyroxine 16mcg daily. Tolerating well.  Requests imitrex refilled as well for migraines.   Taking amitriptyline 100mg  nightly - ongoing trouble sleeping despite this. Unsure if melatonin has helped. Bedtime is 1:30am. Falls asleep at 4-5am. Sleep initiation and maintenance insomnia. Has bedtime routine. Wakes up at 6-7am. Does take daytime naps.   Marked improvement in pain after latest back surgery. No longer in any pain!      Relevant past medical, surgical, family and social history reviewed and updated as indicated. Interim medical history since our last visit reviewed. Allergies and medications reviewed and updated. Outpatient Medications Prior to Visit  Medication Sig Dispense Refill  . acetaminophen (TYLENOL) 500 MG tablet Take 1,000 mg by mouth 2 (two) times daily. IN THE  AFTERNOON & AT NIGHT    . atorvastatin (LIPITOR) 20 MG tablet Take 1 tablet (20 mg total) by mouth daily. 90 tablet 3  . clotrimazole (LOTRIMIN) 1 % cream Apply 1 application topically 2 (two) times daily. To groin (Patient taking differently: Apply 1 application topically 3 (three) times daily. To groin) 60 g 1  . furosemide (LASIX) 20 MG tablet TAKE 1 TABLET BY MOUTH EVERY DAY AS NEEDED FOR FLUID OR EDEMA 90 tablet 1  . gabapentin (NEURONTIN) 300 MG capsule Take 1-2 capsules (300-600 mg total) by mouth See admin instructions. Take 300 mg in the morning, 600 mg in the afternoon, and 300 mg at night 360 capsule 1  . ketoconazole (NIZORAL) 2 % shampoo Apply 1 application topically 2 (two) times a week. (Patient taking differently: Apply 1 application topically 3 (three) times a week. ) 120 mL 0  . nitroGLYCERIN (NITROSTAT) 0.4 MG SL tablet Place 0.4 mg under the tongue every 5 (five) minutes as needed for chest pain.    . pantoprazole (PROTONIX) 40 MG tablet TAKE 1 TABLET BY MOUTH EVERY DAY 90 tablet 3  . amitriptyline (ELAVIL) 50 MG tablet Take 2 tablets (100 mg total) by mouth at bedtime. 60 tablet 1  . HYDROcodone-acetaminophen (NORCO/VICODIN) 5-325 MG tablet Take 1 tablet by mouth every 6 (six) hours as needed for moderate pain. 30 tablet 0  . levothyroxine (SYNTHROID, LEVOTHROID) 100 MCG tablet Take 1 tablet (100 mcg total) by mouth daily before breakfast. 90 tablet 1  . metoprolol (TOPROL-XL) 200 MG 24 hr tablet  Take 200 mg by mouth daily.    . SUMAtriptan (IMITREX) 100 MG tablet Take 1 tablet (100 mg total) by mouth daily as needed for migraine (repeat in 2 hours if needed). Limit to 2/24 hour period 12 tablet 1  . tapentadol HCl (NUCYNTA) 75 MG tablet Take 1 tablet (75 mg total) by mouth every 6 (six) hours as needed for severe pain. 50 tablet 0   No facility-administered medications prior to visit.      Per HPI unless specifically indicated in ROS section below Review of Systems  Objective:    BP (!) 143/91 (BP Location: Right Arm, Patient Position: Sitting, Cuff Size: Large)   Pulse 99   Temp (!) 97.3 F (36.3 C) (Oral)   Ht 5' 10.5" (1.791 m)   Wt 274 lb (124.3 kg)   BMI 38.76 kg/m   Wt Readings from Last 3 Encounters:  02/10/19 274 lb (124.3 kg)  01/20/19 280 lb (127 kg)  12/19/18 275 lb (124.7 kg)     Physical exam: Gen: alert, NAD, not ill appearing Pulm: speaks in complete sentences without increased work of breathing Psych: normal mood, normal thought content      Results for orders placed or performed in visit on 12/10/18  Comprehensive metabolic panel  Result Value Ref Range   Sodium 136 135 - 145 mEq/L   Potassium 3.9 3.5 - 5.1 mEq/L   Chloride 99 96 - 112 mEq/L   CO2 26 19 - 32 mEq/L   Glucose, Bld 121 (H) 70 - 99 mg/dL   BUN 19 6 - 23 mg/dL   Creatinine, Ser 1.19 0.40 - 1.50 mg/dL   Total Bilirubin 2.1 (H) 0.2 - 1.2 mg/dL   Alkaline Phosphatase 122 (H) 39 - 117 U/L   AST 31 0 - 37 U/L   ALT 25 0 - 53 U/L   Total Protein 7.5 6.0 - 8.3 g/dL   Albumin 3.8 3.5 - 5.2 g/dL   Calcium 8.9 8.4 - 10.5 mg/dL   GFR 60.78 >60.00 mL/min  T4, free  Result Value Ref Range   Free T4 0.93 0.60 - 1.60 ng/dL  TSH  Result Value Ref Range   TSH 0.87 0.35 - 4.50 uIU/mL   Assessment & Plan:  Over 25 minutes were spent face-to-face with the patient during this encounter and >50% of that time was spent on counseling and coordination of care  Problem List Items Addressed This Visit    Status post lumbar spine operative procedure for decompression of spinal cord    Marked improvement in pain since last lumbar surgery. Now off narcotic medication.       Insomnia - Primary    Sleep initiation and maintenance insomnia worsening despite amitriptyline 100mg  nightly. Sleep hygiene measures reviewed, handout will be mailed to patient. rec continue amitriptyline 100mg  nightly (tolerating well), add melatonin 5mg  nightly. Update with effect in 2 wks. Consider  med change as needed.       Hypothyroidism    Levothyroxine refilled today. TFTs normal on latest check.       Relevant Medications   levothyroxine (SYNTHROID) 100 MCG tablet   metoprolol (TOPROL-XL) 100 MG 24 hr tablet       Meds ordered this encounter  Medications  . levothyroxine (SYNTHROID) 100 MCG tablet    Sig: Take 1 tablet (100 mcg total) by mouth daily before breakfast.    Dispense:  90 tablet    Refill:  3  . metoprolol (TOPROL-XL) 100 MG 24 hr  tablet    Sig: Take 1 tablet (100 mg total) by mouth daily.    Dispense:  30 tablet    Refill:  6    Future refills from Dr Einar Gip at [facility]  . amitriptyline (ELAVIL) 50 MG tablet    Sig: Take 1-2 tablets (50-100 mg total) by mouth at bedtime.    Dispense:  60 tablet    Refill:  6  . SUMAtriptan (IMITREX) 100 MG tablet    Sig: Take 1 tablet (100 mg total) by mouth daily as needed for migraine (repeat in 2 hours if needed). Limit to 2/24 hour period    Dispense:  12 tablet    Refill:  2   No orders of the defined types were placed in this encounter.   Patient instructions: Amitriptyline refilled - 1-2 tablets at night for sleep.  May add melatonin 5mg  nightly for sleep (OTC). Make sure you are doing below measures for bedtime routine.  Update me in 2 weeks with effect of above and below.  Sleep hygiene checklist: 1. Avoid naps during the day 2. Avoid stimulants such as caffeine and nicotine. Avoid bedtime alcohol (it can speed onset of sleep but the body's metabolism can cause awakenings). 3. All forms of exercise help ensure sound sleep - limit vigorous exercise to morning or late afternoon 4. Avoid food too close to bedtime including chocolate (which contains caffeine) 5. Soak up natural light 6. Establish regular bedtime routine. 7. Associate bed with sleep - avoid TV, computer or phone, reading while in bed. 8. Ensure pleasant, relaxing sleep environment - quiet, dark, cool room.  Follow up plan: No  follow-ups on file.  [name], MD

## 2019-02-10 NOTE — Assessment & Plan Note (Signed)
Sleep initiation and maintenance insomnia worsening despite amitriptyline 100mg  nightly. Sleep hygiene measures reviewed, handout will be mailed to patient. rec continue amitriptyline 100mg  nightly (tolerating well), add melatonin 5mg  nightly. Update with effect in 2 wks. Consider med change as needed.

## 2019-02-10 NOTE — Assessment & Plan Note (Addendum)
Marked improvement in pain since last lumbar surgery. Now off narcotic medication.

## 2019-02-10 NOTE — Patient Instructions (Signed)
Amitriptyline refilled - 1-2 tablets at night for sleep.  May add melatonin 5mg  nightly for sleep (OTC). Make sure you are doing below measures for bedtime routine.  Update me in 2 weeks with effect of above and below.   Sleep hygiene checklist: 1. Avoid naps during the day 2. Avoid stimulants such as caffeine and nicotine. Avoid bedtime alcohol (it can speed onset of sleep but the body's metabolism can cause awakenings). 3. All forms of exercise help ensure sound sleep - limit vigorous exercise to morning or late afternoon 4. Avoid food too close to bedtime including chocolate (which contains caffeine) 5. Soak up natural light 6. Establish regular bedtime routine. 7. Associate bed with sleep - avoid TV, computer or phone, reading while in bed. 8. Ensure pleasant, relaxing sleep environment - quiet, dark, cool room.  Insomnia Insomnia is a sleep disorder that makes it difficult to fall asleep or stay asleep. Insomnia can cause fatigue, low energy, difficulty concentrating, mood swings, and poor performance at work or school. There are three different ways to classify insomnia:  Difficulty falling asleep.  Difficulty staying asleep.  Waking up too early in the morning. Any type of insomnia can be long-term (chronic) or short-term (acute). Both are common. Short-term insomnia usually lasts for three months or less. Chronic insomnia occurs at least three times a week for longer than three months. What are the causes? Insomnia may be caused by another condition, situation, or substance, such as:  Anxiety.  Certain medicines.  Gastroesophageal reflux disease (GERD) or other gastrointestinal conditions.  Asthma or other breathing conditions.  Restless legs syndrome, sleep apnea, or other sleep disorders.  Chronic pain.  Menopause.  Stroke.  Abuse of alcohol, tobacco, or illegal drugs.  Mental health conditions, such as depression.  Caffeine.  Neurological disorders, such  as Alzheimer's disease.  An overactive thyroid (hyperthyroidism). Sometimes, the cause of insomnia may not be known. What increases the risk? Risk factors for insomnia include:  Gender. Women are affected more often than men.  Age. Insomnia is more common as you get older.  Stress.  Lack of exercise.  Irregular work schedule or working night shifts.  Traveling between different time zones.  Certain medical and mental health conditions. What are the signs or symptoms? If you have insomnia, the main symptom is having trouble falling asleep or having trouble staying asleep. This may lead to other symptoms, such as:  Feeling fatigued or having low energy.  Feeling nervous about going to sleep.  Not feeling rested in the morning.  Having trouble concentrating.  Feeling irritable, anxious, or depressed. How is this diagnosed? This condition may be diagnosed based on:  Your symptoms and medical history. Your health care provider may ask about: ? Your sleep habits. ? Any medical conditions you have. ? Your mental health.  A physical exam. How is this treated? Treatment for insomnia depends on the cause. Treatment may focus on treating an underlying condition that is causing insomnia. Treatment may also include:  Medicines to help you sleep.  Counseling or therapy.  Lifestyle adjustments to help you sleep better. Follow these instructions at home: Eating and drinking   Limit or avoid alcohol, caffeinated beverages, and cigarettes, especially close to bedtime. These can disrupt your sleep.  Do not eat a large meal or eat spicy foods right before bedtime. This can lead to digestive discomfort that can make it hard for you to sleep. Sleep habits   Keep a sleep diary to help you and  your health care provider figure out what could be causing your insomnia. Write down: ? When you sleep. ? When you wake up during the night. ? How well you sleep. ? How rested you feel  the next day. ? Any side effects of medicines you are taking. ? What you eat and drink.  Make your bedroom a dark, comfortable place where it is easy to fall asleep. ? Put up shades or blackout curtains to block light from outside. ? Use a white noise machine to block noise. ? Keep the temperature cool.  Limit screen use before bedtime. This includes: ? Watching TV. ? Using your smartphone, tablet, or computer.  Stick to a routine that includes going to bed and waking up at the same times every day and night. This can help you fall asleep faster. Consider making a quiet activity, such as reading, part of your nighttime routine.  Try to avoid taking naps during the day so that you sleep better at night.  Get out of bed if you are still awake after 15 minutes of trying to sleep. Keep the lights down, but try reading or doing a quiet activity. When you feel sleepy, go back to bed. General instructions  Take over-the-counter and prescription medicines only as told by your health care provider.  Exercise regularly, as told by your health care provider. Avoid exercise starting several hours before bedtime.  Use relaxation techniques to manage stress. Ask your health care provider to suggest some techniques that may work well for you. These may include: ? Breathing exercises. ? Routines to release muscle tension. ? Visualizing peaceful scenes.  Make sure that you drive carefully. Avoid driving if you feel very sleepy.  Keep all follow-up visits as told by your health care provider. This is important. Contact a health care provider if:  You are tired throughout the day.  You have trouble in your daily routine due to sleepiness.  You continue to have sleep problems, or your sleep problems get worse. Get help right away if:  You have serious thoughts about hurting yourself or someone else. If you ever feel like you may hurt yourself or others, or have thoughts about taking your own  life, get help right away. You can go to your nearest emergency department or call:  Your local emergency services (911 in the [place]  A suicide crisis helpline, such as the [facility] at 575-078-5193. This is open 24 hours a day. Summary  Insomnia is a sleep disorder that makes it difficult to fall asleep or stay asleep.  Insomnia can be long-term (chronic) or short-term (acute).  Treatment for insomnia depends on the cause. Treatment may focus on treating an underlying condition that is causing insomnia.  Keep a sleep diary to help you and your health care provider figure out what could be causing your insomnia. This information is not intended to replace advice given to you by your health care provider. Make sure you discuss any questions you have with your health care provider. Document Released: 09/28/2000 Document Revised: 07/11/2017 Document Reviewed: 07/11/2017 Elsevier Interactive Patient Education  2019 [employer].

## 2019-02-10 NOTE — Assessment & Plan Note (Signed)
Levothyroxine refilled today. TFTs normal on latest check.

## 2019-02-12 ENCOUNTER — Telehealth: Payer: Self-pay

## 2019-02-12 ENCOUNTER — Other Ambulatory Visit: Payer: Self-pay | Admitting: *Deleted

## 2019-02-12 NOTE — Patient Outreach (Signed)
[place] [facility] [facility]) Care Management  02/12/2019  Nicholas [name] [birth year] [id number]   Case closure   Call attempts made on 01/26/19, 01/27/19 and 01/29/19 Unsuccessful outreach letter sent on 01/26/19 without a response   Referral Date:01/22/19 Referral Source:EMMI prevent referral Referral Reason:Please engage patient for [place] Management services.Pt engagement tool score 13 ( Fall x 1, hospital x 1 Insurance:HTA  Plan [facility] RN CM will close case after no response from patient within 10 business days.  [place] L. Lavina Hamman, RN, BSN, [place] Coordinator Office number [phone] Mobile number [phone]  Main [facility] number [phone] Fax number [phone]

## 2019-02-12 NOTE — Telephone Encounter (Signed)
Pt said levothyroxine was not in pharmacy bag when pt picked up meds. I called [facility] and it was too early to fill; pt has rx for levothyroxine ready for pick up at [place]. Pt request transferred to [place]. Pharmacist voiced understanding and rx willb e ready for pick up at [facility] at 3pm. Pt voiced understanding.

## 2019-02-23 DIAGNOSIS — H52203 Unspecified astigmatism, bilateral: Secondary | ICD-10-CM | POA: Diagnosis not present

## 2019-02-23 DIAGNOSIS — H524 Presbyopia: Secondary | ICD-10-CM | POA: Diagnosis not present

## 2019-02-23 DIAGNOSIS — Z961 Presence of intraocular lens: Secondary | ICD-10-CM | POA: Diagnosis not present

## 2019-03-04 ENCOUNTER — Other Ambulatory Visit: Payer: Self-pay | Admitting: Family Medicine

## 2019-03-04 NOTE — Telephone Encounter (Signed)
Please advise is change in rx ok?

## 2019-03-11 ENCOUNTER — Encounter: Payer: Self-pay | Admitting: Family Medicine

## 2019-03-11 ENCOUNTER — Ambulatory Visit (INDEPENDENT_AMBULATORY_CARE_PROVIDER_SITE_OTHER): Payer: PPO | Admitting: Family Medicine

## 2019-03-11 VITALS — Temp 97.8°F | Ht 70.5 in | Wt 283.0 lb

## 2019-03-11 DIAGNOSIS — G894 Chronic pain syndrome: Secondary | ICD-10-CM | POA: Diagnosis not present

## 2019-03-11 DIAGNOSIS — Z9889 Other specified postprocedural states: Secondary | ICD-10-CM | POA: Diagnosis not present

## 2019-03-11 DIAGNOSIS — E039 Hypothyroidism, unspecified: Secondary | ICD-10-CM | POA: Diagnosis not present

## 2019-03-11 DIAGNOSIS — K2100 Gastro-esophageal reflux disease with esophagitis, without bleeding: Secondary | ICD-10-CM

## 2019-03-11 DIAGNOSIS — Z91030 Bee allergy status: Secondary | ICD-10-CM

## 2019-03-11 DIAGNOSIS — M48061 Spinal stenosis, lumbar region without neurogenic claudication: Secondary | ICD-10-CM

## 2019-03-11 DIAGNOSIS — Z6841 Body Mass Index (BMI) 40.0 and over, adult: Secondary | ICD-10-CM | POA: Diagnosis not present

## 2019-03-11 DIAGNOSIS — F5101 Primary insomnia: Secondary | ICD-10-CM | POA: Diagnosis not present

## 2019-03-11 MED ORDER — EPINEPHRINE 0.3 MG/0.3ML IJ SOAJ: 0.3000 mg | INTRAMUSCULAR | 1 refills | Status: DC | PRN

## 2019-03-11 NOTE — Assessment & Plan Note (Signed)
Stable period on amitriptyline 100mg  nightly, now off narcotic after back surgery.

## 2019-03-11 NOTE — Assessment & Plan Note (Signed)
Epipen refilled

## 2019-03-11 NOTE — Progress Notes (Signed)
Virtual visit attempted through [employer]. Due to national recommendations of social distancing due to COVID-19, a virtual visit is felt to be most appropriate for this patient at this time. Interactive audio and video telecommunications were attempted between myself and Nicholas [name], however failed as patient did not having access to video capability.  We continued and completed visit with audio only.  Time: 12:03pm - 12-17pm.   Patient location: home  Provider location: [place] [facility], office If any vitals were documented, they were collected by patient at home unless specified below.    Temp 97.8 F (36.6 C) (Oral)   Ht 5' 10.5" (1.791 m)   Wt 283 lb (128.4 kg)   BMI 40.03 kg/m    CC: 3 mo f/u visit  Subjective:    Patient ID: Nicholas [name], male    DOB: 03-Jul-1951, 68 y.o.   MRN: [id number]  HPI: Nicholas [name] is a 68 y.o. male presenting on 03/11/2019 for Follow-up (3 mo f/u.)   Recent lumbar surgery (2 level decompression by Dr [name]) with marked improvement in lumbar spine pain now off narcotic medication.   Insomnia - seen last month for ongoing trouble. We increased amitriptyline to 100mg  nightly and added melatonin 5mg  nightly. Doing much better with this increase without dry mouth or imbalance/unsteadiness.   Requests refill of epi pen for bee stings. Last filled several years ago. Thinks he's allergic to      Relevant past medical, surgical, family and social history reviewed and updated as indicated. Interim medical history since our last visit reviewed. Allergies and medications reviewed and updated. Outpatient Medications Prior to Visit  Medication Sig Dispense Refill  . acetaminophen (TYLENOL) 500 MG tablet Take 1,000 mg by mouth 2 (two) times daily. IN THE AFTERNOON & AT NIGHT    . amitriptyline (ELAVIL) 50 MG tablet TAKE 1-2 TABLETS (50-100 MG TOTAL) BY MOUTH AT BEDTIME. 180 tablet 3  . atorvastatin (LIPITOR) 20 MG tablet Take 1 tablet (20 mg total) by  mouth daily. 90 tablet 3  . clotrimazole (LOTRIMIN) 1 % cream Apply 1 application topically 2 (two) times daily. To groin (Patient taking differently: Apply 1 application topically 3 (three) times daily. To groin) 60 g 1  . furosemide (LASIX) 20 MG tablet TAKE 1 TABLET BY MOUTH EVERY DAY AS NEEDED FOR FLUID OR EDEMA 90 tablet 1  . gabapentin (NEURONTIN) 300 MG capsule Take 1-2 capsules (300-600 mg total) by mouth See admin instructions. Take 300 mg in the morning, 600 mg in the afternoon, and 300 mg at night 360 capsule 1  . ketoconazole (NIZORAL) 2 % shampoo Apply 1 application topically 2 (two) times a week. (Patient taking differently: Apply 1 application topically 3 (three) times a week. ) 120 mL 0  . levothyroxine (SYNTHROID) 100 MCG tablet Take 1 tablet (100 mcg total) by mouth daily before breakfast. 90 tablet 3  . metoprolol (TOPROL-XL) 100 MG 24 hr tablet Take 1 tablet (100 mg total) by mouth daily. 30 tablet 6  . nitroGLYCERIN (NITROSTAT) 0.4 MG SL tablet Place 0.4 mg under the tongue every 5 (five) minutes as needed for chest pain.    . pantoprazole (PROTONIX) 40 MG tablet TAKE 1 TABLET BY MOUTH EVERY DAY 90 tablet 3  . SUMAtriptan (IMITREX) 100 MG tablet Take 1 tablet (100 mg total) by mouth daily as needed for migraine (repeat in 2 hours if needed). Limit to 2/24 hour period 12 tablet 2   No facility-administered medications  prior to visit.      Per HPI unless specifically indicated in ROS section below Review of Systems Objective:    Temp 97.8 F (36.6 C) (Oral)   Ht 5' 10.5" (1.791 m)   Wt 283 lb (128.4 kg)   BMI 40.03 kg/m   Wt Readings from Last 3 Encounters:  03/11/19 283 lb (128.4 kg)  02/10/19 274 lb (124.3 kg)  01/20/19 280 lb (127 kg)     Physical exam: Gen: alert, NAD, not ill appearing Pulm: speaks in complete sentences without increased work of breathing Psych: normal mood, normal thought content      Results for orders placed or performed in visit on  12/10/18  Comprehensive metabolic panel  Result Value Ref Range   Sodium 136 135 - 145 mEq/L   Potassium 3.9 3.5 - 5.1 mEq/L   Chloride 99 96 - 112 mEq/L   CO2 26 19 - 32 mEq/L   Glucose, Bld 121 (H) 70 - 99 mg/dL   BUN 19 6 - 23 mg/dL   Creatinine, Ser 1.19 0.40 - 1.50 mg/dL   Total Bilirubin 2.1 (H) 0.2 - 1.2 mg/dL   Alkaline Phosphatase 122 (H) 39 - 117 U/L   AST 31 0 - 37 U/L   ALT 25 0 - 53 U/L   Total Protein 7.5 6.0 - 8.3 g/dL   Albumin 3.8 3.5 - 5.2 g/dL   Calcium 8.9 8.4 - 10.5 mg/dL   GFR 60.78 >60.00 mL/min  T4, free  Result Value Ref Range   Free T4 0.93 0.60 - 1.60 ng/dL  TSH  Result Value Ref Range   TSH 0.87 0.35 - 4.50 uIU/mL   Assessment & Plan:   Problem List Items Addressed This Visit    Status post lumbar spine operative procedure for decompression of spinal cord   Spinal stenosis of lumbar region without neurogenic claudication   Morbid obesity with BMI of 40.0-44.9, adult ([place])    Weight gain noted. Discussed caution with increased amitriptyline dose. Encouraged healthy diet changes for sustainable weight loss.       Insomnia - Primary    Doing better on higher amitriptyline dose along with melatonin 5mg , tolerating meds well. Will continue this.       Hypothyroidism    Levels now normal range - continue current levothyroxine regimen.       GERD (gastroesophageal reflux disease)    Chronic, stable on daily protonix 40mg  daily.       Chronic pain syndrome    Stable period on amitriptyline 100mg  nightly, now off narcotic after back surgery.       Allergy to bee sting    Epi pen refilled.           Meds ordered this encounter  Medications  . DISCONTD: EPINEPHrine 0.3 mg/0.3 mL IJ SOAJ injection    Sig: Inject 0.3 mLs (0.3 mg total) into the muscle as needed for anaphylaxis. (bee stings)    Dispense:  1 Device    Refill:  1  . EPINEPHrine 0.3 mg/0.3 mL IJ SOAJ injection    Sig: Inject 0.3 mLs (0.3 mg total) into the muscle as needed  for anaphylaxis. (bee stings)    Dispense:  2 Device    Refill:  1   No orders of the defined types were placed in this encounter.   Follow up plan: No follow-ups on file.  [name], MD

## 2019-03-11 NOTE — Assessment & Plan Note (Signed)
Chronic, stable on daily protonix 40mg  daily.

## 2019-03-11 NOTE — Assessment & Plan Note (Signed)
Doing better on higher amitriptyline dose along with melatonin 5mg , tolerating meds well. Will continue this.

## 2019-03-11 NOTE — Assessment & Plan Note (Signed)
Weight gain noted. Discussed caution with increased amitriptyline dose. Encouraged healthy diet changes for sustainable weight loss.

## 2019-03-11 NOTE — Assessment & Plan Note (Signed)
Levels now normal range - continue current levothyroxine regimen.

## 2019-03-31 ENCOUNTER — Other Ambulatory Visit: Payer: Self-pay

## 2019-03-31 ENCOUNTER — Ambulatory Visit (INDEPENDENT_AMBULATORY_CARE_PROVIDER_SITE_OTHER): Payer: PPO | Admitting: Orthopaedic Surgery

## 2019-03-31 ENCOUNTER — Encounter: Payer: Self-pay | Admitting: Orthopaedic Surgery

## 2019-03-31 DIAGNOSIS — M79672 Pain in left foot: Secondary | ICD-10-CM

## 2019-03-31 DIAGNOSIS — L84 Corns and callosities: Secondary | ICD-10-CM

## 2019-03-31 NOTE — Progress Notes (Signed)
Office Visit Note   Patient: SOCORRO EBRON           Date of Birth: 22-Aug-1951           MRN: [id number] Visit Date: 03/31/2019              Requested by: [name], MD [address] [place],  [place] [address] PCP: [name], MD   Assessment & Plan: Visit Diagnoses:  1. Callus of foot     Plan: Betadine prep sterile 10 blade scalpel debridement of callus down the soft skin debriding the underlying blood blister.  We discussed foot care.  A good look into having a pedicure to have the callus trimmed.  He can return as needed previously had trim this and early April.  Follow-up as needed.  Follow-Up Instructions: Return if symptoms worsen or fail to improve.   Orders:  No orders of the defined types were placed in this encounter.  No orders of the defined types were placed in this encounter.     Procedures: No procedures performed   Clinical Data: No additional findings.   Subjective: Chief Complaint  Patient presents with  . Left Foot - Pain    HPI 68 year old male returns for known recurrent problem which is left foot callus over the first metatarsal head where he has had a left great toe amputation.  He is not been able to trim the callus due to the fused right knee and difficulty reaching the plantar surface of his foot he can reach the midfoot dorsally but cannot see plantar surface for trimming.  Review of Systemsupdated and non contributory to HPI previous lumbar decompression to level 2020.  Reverse shoulder 2019.  Reverse shoulder left 2018.  Left total knee arthroplasty 2018.   Objective: Vital Signs: Ht 5\' 9"  (1.753 m)   Wt 280 lb (127 kg)   BMI 41.35 kg/m   Physical Exam Constitutional:      Appearance: He is well-developed.  HENT:     Head: Normocephalic and atraumatic.  Eyes:     Pupils: Pupils are equal, round, and reactive to light.  Neck:     Thyroid: No thyromegaly.     Trachea: No tracheal deviation.   Cardiovascular:     Rate and Rhythm: Normal rate.  Pulmonary:     Effort: Pulmonary effort is normal.     Breath sounds: No wheezing.  Abdominal:     General: Bowel sounds are normal.     Palpations: Abdomen is soft.  Skin:    General: Skin is warm and dry.     Capillary Refill: Capillary refill takes less than 2 seconds.  Neurological:     Mental Status: He is alert and oriented to person, place, and time.  Psychiatric:        Behavior: Behavior normal.        Thought Content: Thought content normal.        Judgment: Judgment normal.     Ortho Exam patient with painful tendril tender callus over his left first metatarsal previous toe amputation.  Fused right knee well-healed.  There is significant tenderness and about 1 cm thickening of callus over the painful blood blister.  Specialty Comments:  No specialty comments available.  Imaging: No results found.   PMFS History: Patient Active Problem List   Diagnosis Date Noted  . Callus of foot 04/02/2019  . Allergy to bee sting 03/11/2019  . Insomnia 02/10/2019  . Status post lumbar  spine operative procedure for decompression of spinal cord 12/12/2018  . Dyslipidemia 10/19/2018  . Foot deformity 10/19/2018  . Spinal stenosis of lumbar region without neurogenic claudication 10/01/2018  . Facial rash 05/26/2018  . Status post reverse total replacement of right shoulder 05/13/2018  . Pain and swelling of lower leg, right 03/20/2018  . Epigastric pain 02/26/2018  . CHF (congestive heart failure) ([place]) 12/27/2017  . OSA (obstructive sleep apnea) 12/27/2017  . Health maintenance examination 11/25/2017  . Renal insufficiency 10/01/2017  . Fatty liver 10/01/2017  . Status post reverse total replacement of left shoulder 09/03/2017  . Bilateral shoulder pain 05/24/2017  . History of CVA in adulthood 03/08/2017  . S/P total knee arthroplasty, left 01/30/2017  . Arthritis of knee 01/15/2017  . Skin rash 12/10/2016  . Medicare  annual wellness visit, initial 10/05/2016  . Advanced care planning/counseling discussion 10/05/2016  . Morbid obesity with BMI of 40.0-44.9, adult ([place]) 07/19/2016  . Hypothyroidism   . IDA (iron deficiency anemia) 04/23/2016  . Chronic pain syndrome 04/23/2016  . Inflammatory arthritis 04/23/2016  . Chronic leg pain 04/23/2016  . Lesion of palate 04/23/2016  . Grieving 04/23/2016  . GERD (gastroesophageal reflux disease)   . Duodenitis determined by biopsy 02/13/2016  . Diverticulosis of colon without hemorrhage 01/31/2016  . Chest pain 01/11/2012  . Migraine 01/11/2012  . History of methicillin resistant staphylococcus aureus (MRSA) 07/04/2010  . DEGENERATIVE DISC DISEASE 07/04/2010  . History of total right knee replacement 07/04/2010  . Primary osteoarthritis, right shoulder 06/09/2007  . Personal history of colonic adenoma 06/01/2003   Past Medical History:  Diagnosis Date  . Acute kidney failure 08/2008   "cleared up"  no problems since  . Allergy   . Anxiety   . Arthritis   . Asthma    ?of this no inhaler  . Blood transfusion   . CHF (congestive heart failure) ([place])    ?of this, pt denies  . CLOSTRIDIUM DIFFICILE COLITIS 07/04/2010   Annotation: 12/09, 2/10 Qualifier: Diagnosis of  By: [name], [name]    . Depression with anxiety   . Duodenitis determined by biopsy 02/2016   peptic likely due to aleve (erosive gastropathy with duodenal erosions)  . ECZEMA 07/04/2010   Qualifier: Diagnosis of  By: [name], [name]    . Elevated liver enzymes   . Full dentures   . GERD (gastroesophageal reflux disease)   . Heart attack ([place])    08/2008 (likely demand ischemia in the setting of MSSA sepsis/R TKA  infection)10-2008  . History of hiatal hernia   . History of stomach ulcers   . Hypothyroidism   . Lower GI bleeding   . MALAR AND MAXILLARY BONES CLOSED FRACTURE 07/04/2010   Annotation: ORIF Qualifier: Diagnosis of  By: [name], [name]    . Migraine    "definitely"   . MRSA (methicillin resistant Staphylococcus aureus)    in leg, had to place steel rod in leg  . Personal history of colonic adenoma 06/01/2003  . PFO (patent foramen ovale)    small PFO by 08/2008 TEE  . Pneumonia 08/2008   "while in ICU"  . Seizures ([place]) 2009   "long time ago"    . Sleep apnea    does not wear CPAP  . Stroke [facility]) 2010   unable to complete sentences at times  . Wears glasses     Family History  Problem Relation Age of Onset  . Coronary artery disease Father 55  .  Stroke Father   . CAD Mother 19       stents  . Breast cancer Sister   . Diabetes Brother   . Diabetes Sister   . Colon cancer Neg Hx   . Stomach cancer Neg Hx     Past Surgical History:  Procedure Laterality Date  . anterior  nerve transposition  07/2009   left ulnar nerve  . antibiotic spacer exchange  11/2008; 08/2006   right knee  . ARTHROTOMY  08/2008   right knee w/I&D  . CARDIOVASCULAR STRESS TEST  2013   stress EKG - negative for ischemia, 4 min 7.3 METs, normal blood pressure response  . CATARACT EXTRACTION    . COLONOSCOPY  11/2012   diverticulosis, rpt 10 yrs [name])  . ESOPHAGOGASTRODUODENOSCOPY  02/2016   erosive gastropathy with duodenal erosions [name])  . HARDWARE REMOVAL  04/2006   right knee w/antibiotic spacers placed  . INGUINAL HERNIA REPAIR  early 1990's   bilateral  . JOINT REPLACEMENT Bilateral   . KNEE ARTHROSCOPY  10/2001   right  . KNEE FUSION  03/2009   right knee removal; antibiotic spacers;   . LEFT HEART CATH AND CORONARY ANGIOGRAPHY N/A 02/04/2018    WNL (Patwardhan, Reynold Bowen, MD)  . LUMBAR LAMINECTOMY/DECOMPRESSION MICRODISCECTOMY N/A 12/05/2018   Procedure: LUMBAR THREE TO FOUR AND LUMBAR FOUR TO FIVE DECOMPRESSION;  Surgeon: [name], MD;  Location: [facility];  Service: Orthopedics;  Laterality: N/A;  . MULTIPLE TOOTH EXTRACTIONS    . REPLACEMENT TOTAL KNEE  08/2008; 09/2001   right  . REVERSE SHOULDER ARTHROPLASTY Left 09/03/2017   Procedure:  REVERSE SHOULDER ARTHROPLASTY;  Surgeon: Meredith Pel, MD;  Location: [facility];  Service: Orthopedics;  Laterality: Left;  . REVERSE SHOULDER ARTHROPLASTY Right 05/13/2018   Procedure: RIGHT REVERSE SHOULDER ARTHROPLASTY;  Surgeon: Meredith Pel, MD;  Location: [facility];  Service: Orthopedics;  Laterality: Right;  . rod placement Right   . SYNOVECTOMY  06/2005   debridement, liner exchange right knee  . TOE AMPUTATION Left 08/2008   great toe - osteomyelitis (staph infection)  . TOE AMPUTATION Left 04/2016   2nd toe [name]  . TONSILLECTOMY    . TOTAL KNEE ARTHROPLASTY Left 01/15/2017  . TOTAL KNEE ARTHROPLASTY Left 01/15/2017   Procedure: TOTAL KNEE ARTHROPLASTY;  Surgeon: Meredith Pel, MD;  Location: [facility];  Service: Orthopedics;  Laterality: Left;   Social History   Occupational History  . Occupation: retired  Tobacco Use  . Smoking status: Never Smoker  . Smokeless tobacco: Never Used  Substance and Sexual Activity  . Alcohol use: No    Alcohol/week: 0.0 standard drinks    Comment: "I abused alcohol; last drink  ~ 2005"  . Drug use: No  . Sexual activity: Never

## 2019-04-02 DIAGNOSIS — L84 Corns and callosities: Secondary | ICD-10-CM | POA: Insufficient documentation

## 2019-05-08 ENCOUNTER — Other Ambulatory Visit: Payer: Self-pay | Admitting: Family Medicine

## 2019-05-08 NOTE — Telephone Encounter (Signed)
Last filled on 02/10/2019 for #12 with 2 refills. LOV 03/11/2019. Future appointment on 12/14/2019

## 2019-05-13 ENCOUNTER — Encounter: Payer: Self-pay | Admitting: Family Medicine

## 2019-05-13 ENCOUNTER — Ambulatory Visit (INDEPENDENT_AMBULATORY_CARE_PROVIDER_SITE_OTHER)
Admission: RE | Admit: 2019-05-13 | Discharge: 2019-05-13 | Disposition: A | Discharge status: Home / Self Care | Payer: PPO | Source: Ambulatory Visit | Attending: Family Medicine | Admitting: Family Medicine

## 2019-05-13 ENCOUNTER — Other Ambulatory Visit: Payer: Self-pay

## 2019-05-13 ENCOUNTER — Ambulatory Visit (INDEPENDENT_AMBULATORY_CARE_PROVIDER_SITE_OTHER): Payer: PPO | Admitting: Family Medicine

## 2019-05-13 VITALS — BP 116/78 | HR 68 | Temp 97.9°F | Ht 70.5 in | Wt 280.2 lb

## 2019-05-13 DIAGNOSIS — M48061 Spinal stenosis, lumbar region without neurogenic claudication: Secondary | ICD-10-CM | POA: Diagnosis not present

## 2019-05-13 DIAGNOSIS — M545 Low back pain, unspecified: Secondary | ICD-10-CM

## 2019-05-13 DIAGNOSIS — Z9889 Other specified postprocedural states: Secondary | ICD-10-CM | POA: Diagnosis not present

## 2019-05-13 DIAGNOSIS — S3992XA Unspecified injury of lower back, initial encounter: Secondary | ICD-10-CM | POA: Diagnosis not present

## 2019-05-13 MED ORDER — METHOCARBAMOL 500 MG PO TABS: 500.0000 mg | ORAL_TABLET | Freq: Three times a day (TID) | ORAL | 0 refills | Status: DC | PRN

## 2019-05-13 NOTE — Patient Instructions (Addendum)
Xray of lower back today. Continue amitriptyline and gabapentin and tylenol.  Add robaxin muscle relaxant.  If not improving with this, return to see Dr [name] for recheck.

## 2019-05-13 NOTE — Progress Notes (Signed)
This visit was conducted in person.  BP 116/78 (BP Location: Left Arm, Patient Position: Sitting, Cuff Size: Large)   Pulse 68   Temp 97.9 F (36.6 C) (Temporal)   Ht 5' 10.5" (1.791 m)   Wt 280 lb 4 oz (127.1 kg)   SpO2 94%   BMI 39.64 kg/m    CC: fall  Subjective:    Patient ID: Nicholas [name], male    DOB: 1951/08/23, 68 y.o.   MRN: [id number]  HPI: Nicholas [name] is a 68 y.o. male presenting on 05/13/2019 for Fall (Pt recently fell out of bed injuring lower back.  Says he was having a dream and woke up on the floor. )   DOI: 4-5 d ago fell out of bed (doesn't remember specific date). Scraped R elbow, but doesn't remember injuring back. 2-3d ago early in the morning getting out of bed felt pop of lower back and since then, increasing back pain. Improved with sitting and laying down, worsened with prolonged walking.  Treating with regular pain medications - gabapentin, amitriptyline, tylenol 1000mg  TID. No new shooting pain down legs, numbness/weakness of legs, bowel/bladder accidents, saddle anesthesia.   H/o multiple joint and back surgeries, latest lumbar laminectomy and decompression (L3/4 and L4/5 by Dr [name]) 11/2018.      Relevant past medical, surgical, family and social history reviewed and updated as indicated. Interim medical history since our last visit reviewed. Allergies and medications reviewed and updated. Outpatient Medications Prior to Visit  Medication Sig Dispense Refill  . acetaminophen (TYLENOL) 500 MG tablet Take 1,000 mg by mouth 2 (two) times daily. IN THE AFTERNOON & AT NIGHT    . amitriptyline (ELAVIL) 50 MG tablet TAKE 1-2 TABLETS (50-100 MG TOTAL) BY MOUTH AT BEDTIME. 180 tablet 3  . atorvastatin (LIPITOR) 20 MG tablet Take 1 tablet (20 mg total) by mouth daily. 90 tablet 3  . clotrimazole (LOTRIMIN) 1 % cream Apply 1 application topically 2 (two) times daily. To groin (Patient taking differently: Apply 1 application topically 3 (three) times daily. To  groin) 60 g 1  . EPINEPHrine 0.3 mg/0.3 mL IJ SOAJ injection Inject 0.3 mLs (0.3 mg total) into the muscle as needed for anaphylaxis. (bee stings) 2 Device 1  . furosemide (LASIX) 20 MG tablet TAKE 1 TABLET BY MOUTH EVERY DAY AS NEEDED FOR FLUID OR EDEMA 90 tablet 1  . gabapentin (NEURONTIN) 300 MG capsule Take 1-2 capsules (300-600 mg total) by mouth See admin instructions. Take 300 mg in the morning, 600 mg in the afternoon, and 300 mg at night 360 capsule 1  . ketoconazole (NIZORAL) 2 % shampoo Apply 1 application topically 2 (two) times a week. (Patient taking differently: Apply 1 application topically 3 (three) times a week. ) 120 mL 0  . levothyroxine (SYNTHROID) 100 MCG tablet Take 1 tablet (100 mcg total) by mouth daily before breakfast. 90 tablet 3  . metoprolol (TOPROL-XL) 100 MG 24 hr tablet Take 1 tablet (100 mg total) by mouth daily. 30 tablet 6  . nitroGLYCERIN (NITROSTAT) 0.4 MG SL tablet Place 0.4 mg under the tongue every 5 (five) minutes as needed for chest pain.    . pantoprazole (PROTONIX) 40 MG tablet TAKE 1 TABLET BY MOUTH EVERY DAY 90 tablet 3  . SUMAtriptan (IMITREX) 100 MG tablet TAKE 1 TABLET (100 MG TOTAL) BY MOUTH DAILY AS NEEDED FOR MIGRAINE (REPEAT IN 2 HOURS IF NEEDED). LIMIT TO 2/24 HOUR PERIOD 12 tablet 1   No facility-administered  medications prior to visit.      Per HPI unless specifically indicated in ROS section below Review of Systems Objective:    BP 116/78 (BP Location: Left Arm, Patient Position: Sitting, Cuff Size: Large)   Pulse 68   Temp 97.9 F (36.6 C) (Temporal)   Ht 5' 10.5" (1.791 m)   Wt 280 lb 4 oz (127.1 kg)   SpO2 94%   BMI 39.64 kg/m   Wt Readings from Last 3 Encounters:  05/13/19 280 lb 4 oz (127.1 kg)  03/31/19 280 lb (127 kg)  03/11/19 283 lb (128.4 kg)    Physical Exam Vitals signs and nursing note reviewed.  Constitutional:      General: He is not in acute distress.    Appearance: Normal appearance. He is not  ill-appearing.  Musculoskeletal:     Right lower leg: No edema.     Left lower leg: No edema.     Comments:  Tender to palpation midline lower lumbar spine + bilateral lower lumbar paraspinous mm tenderness SLR painful L>R R knee chronically fixed in extension  Skin:    General: Skin is warm and dry.     Findings: No erythema or rash.     Comments: Incision c/d/i, no bruising or erythema around incision  Neurological:     Mental Status: He is alert.  Psychiatric:        Mood and Affect: Mood normal.        Behavior: Behavior normal.       Assessment & Plan:   Problem List Items Addressed This Visit    Status post lumbar spine operative procedure for decompression of spinal cord   Spinal stenosis of lumbar region without neurogenic claudication   Acute midline low back pain without sciatica - Primary    Acute lower back pain after fall out of bed over weekend, in setting of recent 2 level lumbar spine laminectomy/decompression. Fortunately no red flags noted . Check films today. Treat as lumbar strain with robaxin course. Advised f/u with ortho [name]) if not improving with this.       Relevant Medications   methocarbamol (ROBAXIN) 500 MG tablet   Other Relevant Orders   DG Lumbar Spine Complete       Meds ordered this encounter  Medications  . methocarbamol (ROBAXIN) 500 MG tablet    Sig: Take 1 tablet (500 mg total) by mouth 3 (three) times daily as needed for muscle spasms (sedation precautions).    Dispense:  40 tablet    Refill:  0   Orders Placed This Encounter  Procedures  . DG Lumbar Spine Complete    Standing Status:   Future    Standing Expiration Date:   07/13/2020    Order Specific Question:   Reason for Exam (SYMPTOM  OR DIAGNOSIS REQUIRED)    Answer:   midline lumbar pain after fall out of bed, h/o recent lumbar surgery    Order Specific Question:   Preferred imaging location?    Answer:   [facility]    Order Specific Question:   Radiology  Contrast Protocol - do NOT remove file path    Answer:   [email or web address]    Follow up plan: Return if symptoms worsen or fail to improve.  [name], MD

## 2019-05-13 NOTE — Assessment & Plan Note (Signed)
Acute lower back pain after fall out of bed over weekend, in setting of recent 2 level lumbar spine laminectomy/decompression. Fortunately no red flags noted . Check films today. Treat as lumbar strain with robaxin course. Advised f/u with ortho [name]) if not improving with this.

## 2019-06-26 ENCOUNTER — Other Ambulatory Visit: Payer: Self-pay | Admitting: Family Medicine

## 2019-06-26 NOTE — Telephone Encounter (Signed)
Gabapentin Last filled:  03/20/19, #360 Last OV:  05/13/19, acute Next OV:  12/14/19, CPE Prt 2

## 2019-07-05 ENCOUNTER — Other Ambulatory Visit: Payer: Self-pay | Admitting: Family Medicine

## 2019-07-06 NOTE — Telephone Encounter (Signed)
Last filled on 05/08/2019 for #12 with 1 refills. LOV 05/13/2019. Future appointment on 12/14/2019... please advise

## 2019-08-03 IMAGING — DX LUMBAR SPINE - COMPLETE 4+ VIEW
5 series · 5 of 5 positions shown · non-contrast
Comparison: None.

CLINICAL DATA: Lumbar pain status post fall

EXAM:
LUMBAR SPINE - COMPLETE 4+ VIEW

[l-spine ap]
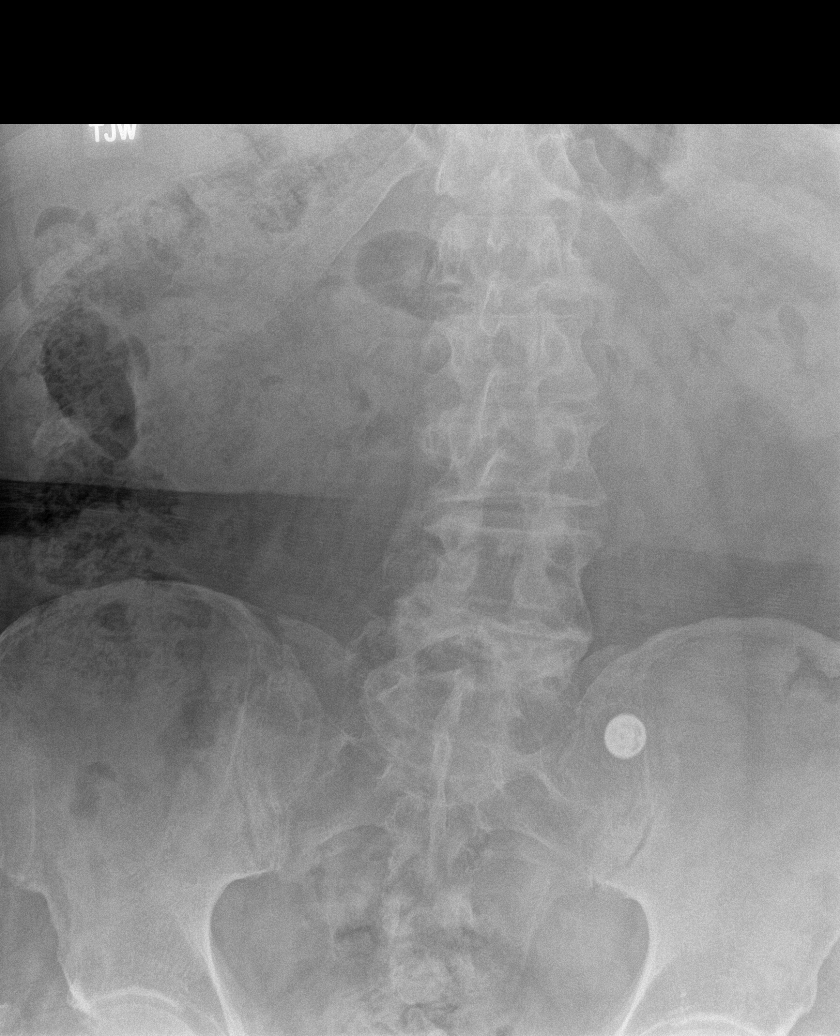

[l-spine obl (1 of 2)]
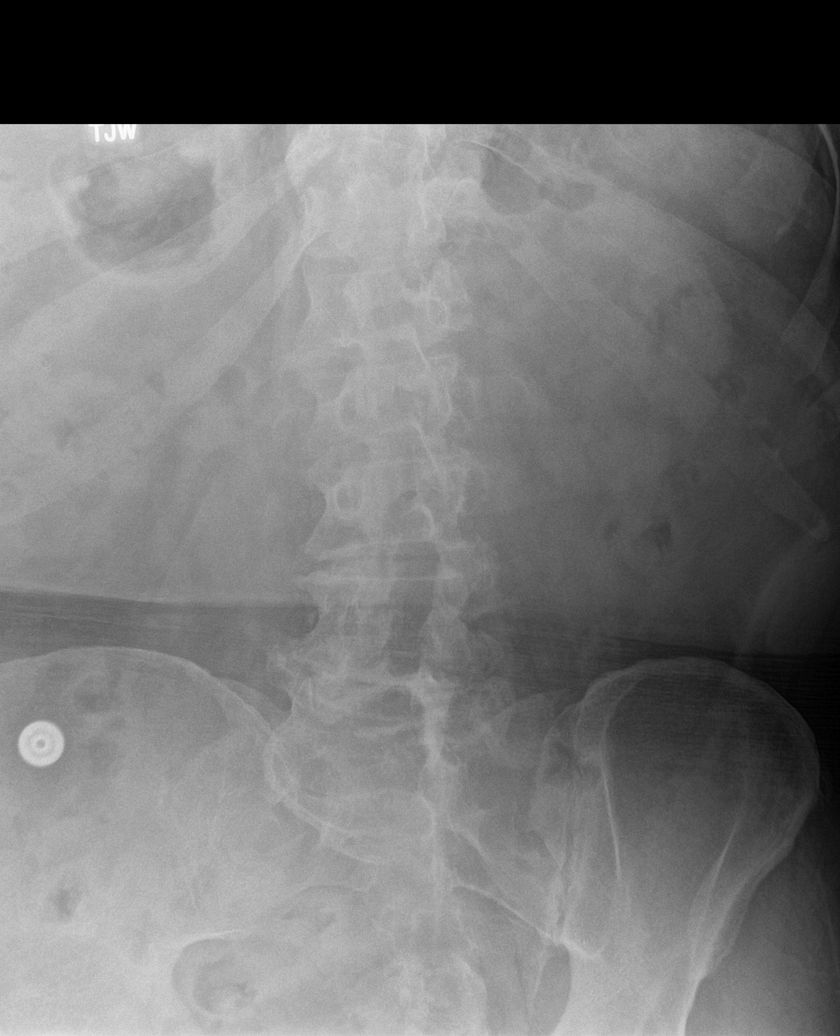

[l-spine obl (2 of 2)]
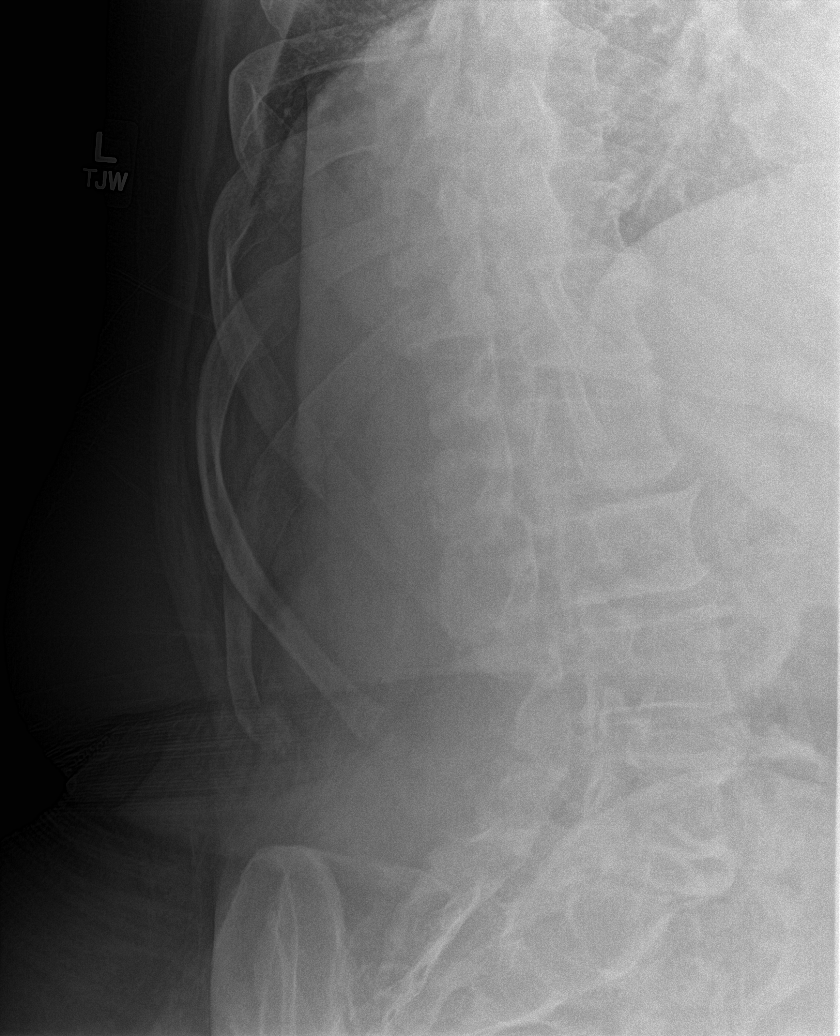

[l-spine lat]
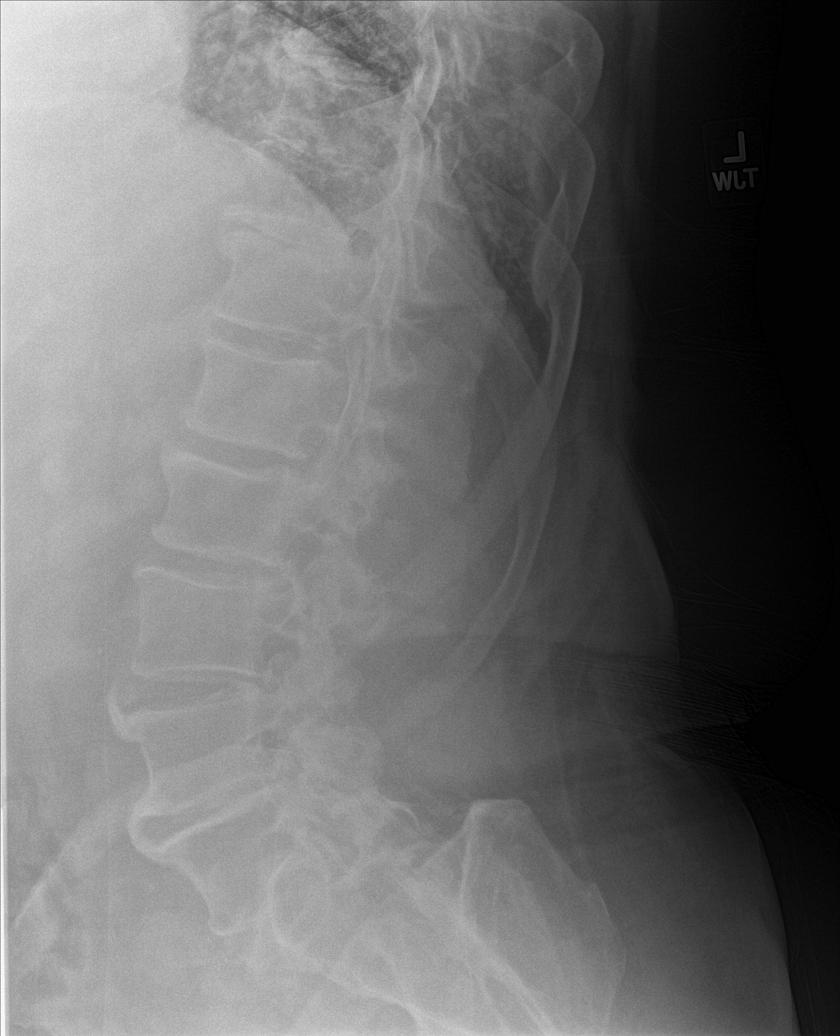

[l-spine l5/s1]
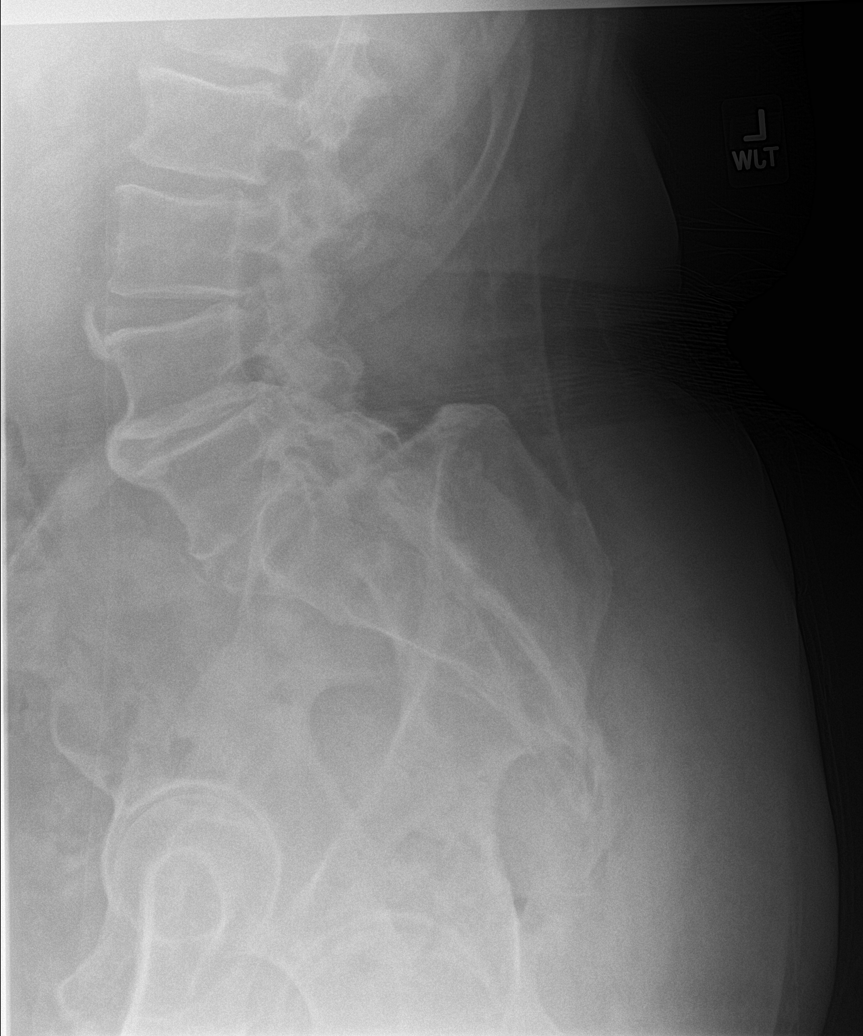

[5 of 5 positions shown; findings below may reference images not displayed]

FINDINGS: There is no evidence of lumbar spine fracture. Alignment is normal.
Degenerative disease with disc height loss of the lumbar spine with
facet arthropathy most severe at L4-5 and L5-S1.
IMPRESSION: Lumbar spine spondylosis as described above.

No acute osseous injury of the lumbar spine.

## 2019-08-04 ENCOUNTER — Other Ambulatory Visit: Payer: Self-pay | Admitting: Family Medicine

## 2019-08-06 ENCOUNTER — Other Ambulatory Visit: Payer: Self-pay | Admitting: Family Medicine

## 2019-08-06 NOTE — Telephone Encounter (Signed)
Last filled on 02/09/2019 #90 with 1 refill. LOV 05/13/2019 for acute visit  Next appointment on 12/14/2019 CPE

## 2019-08-07 ENCOUNTER — Other Ambulatory Visit: Payer: Self-pay | Admitting: Family Medicine

## 2019-08-07 NOTE — Telephone Encounter (Signed)
Sumatriptan Last filled:  07/07/19, #12 Last OV:  05/13/19, acute back pain Next OV:  12/14/19, CPE prt 2

## 2019-10-05 ENCOUNTER — Other Ambulatory Visit: Payer: Self-pay | Admitting: Family Medicine

## 2019-10-06 ENCOUNTER — Other Ambulatory Visit: Payer: Self-pay | Admitting: Family Medicine

## 2019-11-10 ENCOUNTER — Telehealth: Payer: Self-pay | Admitting: Family Medicine

## 2019-11-10 NOTE — Telephone Encounter (Addendum)
Filled and in Lisa's box 

## 2019-11-10 NOTE — Telephone Encounter (Signed)
Patient came to the office to request a handicap placard.  Application has been placed in Dr. Bosie Clos inbox.

## 2019-11-11 ENCOUNTER — Encounter: Payer: Self-pay | Admitting: Orthopedic Surgery

## 2019-11-11 ENCOUNTER — Ambulatory Visit: Payer: PPO | Admitting: Orthopedic Surgery

## 2019-11-11 ENCOUNTER — Ambulatory Visit (INDEPENDENT_AMBULATORY_CARE_PROVIDER_SITE_OTHER): Payer: PPO

## 2019-11-11 ENCOUNTER — Other Ambulatory Visit: Payer: Self-pay

## 2019-11-11 DIAGNOSIS — G8929 Other chronic pain: Secondary | ICD-10-CM

## 2019-11-11 DIAGNOSIS — M25551 Pain in right hip: Secondary | ICD-10-CM

## 2019-11-11 DIAGNOSIS — M5441 Lumbago with sciatica, right side: Secondary | ICD-10-CM

## 2019-11-11 DIAGNOSIS — M5442 Lumbago with sciatica, left side: Secondary | ICD-10-CM

## 2019-11-11 MED ORDER — METHOCARBAMOL 500 MG PO TABS: 500.0000 mg | ORAL_TABLET | Freq: Three times a day (TID) | ORAL | 0 refills | Status: DC | PRN

## 2019-11-11 NOTE — Telephone Encounter (Signed)
Spoke with pt notifying him the form is ready to pick up.  Also, there is a $5.00 fee to have it completed.  Pt verbalizes understanding.  Placed form at front office- yellow folders.  Made copy to scan and for billing.

## 2019-11-13 ENCOUNTER — Telehealth: Payer: Self-pay | Admitting: Orthopedic Surgery

## 2019-11-13 NOTE — Telephone Encounter (Signed)
Pt called in said he went to his pharmacy to pick up his prescription Methocarbamol but the pharmacy is saying they haven't received anything for the pt? Pt is requesting you please have that sent to [facility] [place].   [phone]

## 2019-11-13 NOTE — Telephone Encounter (Signed)
Please advise. Thanks.  

## 2019-11-15 ENCOUNTER — Encounter: Payer: Self-pay | Admitting: Orthopedic Surgery

## 2019-11-15 ENCOUNTER — Other Ambulatory Visit: Payer: Self-pay | Admitting: Surgical

## 2019-11-15 MED ORDER — METHOCARBAMOL 500 MG PO TABS: 500.0000 mg | ORAL_TABLET | Freq: Three times a day (TID) | ORAL | 0 refills | Status: DC | PRN

## 2019-11-15 NOTE — Progress Notes (Signed)
Office Visit Note   Patient: Nicholas [name]           Date of Birth: 1951/03/06           MRN: [id number] Visit Date: 11/11/2019 Requested by: [name], MD [place],  [place] [id number] PCP: [name], MD  Subjective: Chief Complaint  Patient presents with  . Right Hip - Pain    HPI: Nicholas [name] is a patient with right groin and right leg pain.  He has effusion in the right knee.  Sometimes he has pain in the low back as well.  Does not want to do any injections because they did not help before.  Did have left-sided L3-4 and L4-5 decompression 12/05/2018.  He has had 4 days of pain.  Some difficulty walking.  Takes acetaminophen.              ROS: All systems reviewed are negative as they relate to the chief complaint within the history of present illness.  Patient denies  fevers or chills.   Assessment & Plan: Visit Diagnoses:  1. Chronic low back pain with bilateral sciatica, unspecified back pain laterality   2. Pain in right hip     Plan: Impression is low back pain with normal hip exam and no real arthritis in the hip.  I think this is likely coming from his back.  Patient does not want to have any injections in his back.  We will try muscle relaxer but a repeat back scan will be the next step if his symptoms persist.  Follow-Up Instructions: Return if symptoms worsen or fail to improve.   Orders:  Orders Placed This Encounter  Procedures  . XR HIP UNILAT W OR W/O PELVIS 2-3 VIEWS RIGHT  . XR Lumbar Spine 2-3 Views   Meds ordered this encounter  Medications  . DISCONTD: methocarbamol (ROBAXIN) 500 MG tablet    Sig: Take 1 tablet (500 mg total) by mouth every 8 (eight) hours as needed for muscle spasms.    Dispense:  30 tablet    Refill:  0      Procedures: No procedures performed   Clinical Data: No additional findings.  Objective: Vital Signs: There were no vitals taken for this visit.  Physical Exam:   Constitutional:  Patient appears well-developed HEENT:  Head: Normocephalic Eyes:EOM are normal Neck: Normal range of motion Cardiovascular: Normal rate Pulmonary/chest: Effort normal Neurologic: Patient is alert Skin: Skin is warm Psychiatric: Patient has normal mood and affect    Ortho Exam: Ortho exam demonstrates good range of motion of his shoulders.  Patient has fairly minimal groin pain with internal/external rotation of the right and left leg.  No definite nerve root tension signs.  No trochanteric tenderness is noted.  Ankle dorsiflexion plantarflexion strength is intact.  Specialty Comments:  No specialty comments available.  Imaging: No results found.   PMFS History: Patient Active Problem List   Diagnosis Date Noted  . Acute midline low back pain without sciatica 05/13/2019  . Callus of foot 04/02/2019  . Allergy to bee sting 03/11/2019  . Insomnia 02/10/2019  . Status post lumbar spine operative procedure for decompression of spinal cord 12/12/2018  . Dyslipidemia 10/19/2018  . Foot deformity 10/19/2018  . Spinal stenosis of lumbar region without neurogenic claudication 10/01/2018  . Facial rash 05/26/2018  . Status post reverse total replacement of right shoulder 05/13/2018  . Pain and swelling of lower leg, right 03/20/2018  . Epigastric  pain 02/26/2018  . CHF (congestive heart failure) ([place]) 12/27/2017  . OSA (obstructive sleep apnea) 12/27/2017  . Health maintenance examination 11/25/2017  . Renal insufficiency 10/01/2017  . Fatty liver 10/01/2017  . Status post reverse total replacement of left shoulder 09/03/2017  . Bilateral shoulder pain 05/24/2017  . History of CVA in adulthood 03/08/2017  . S/P total knee arthroplasty, left 01/30/2017  . Arthritis of knee 01/15/2017  . Skin rash 12/10/2016  . Medicare annual wellness visit, initial 10/05/2016  . Advanced care planning/counseling discussion 10/05/2016  . Morbid obesity with BMI of 40.0-44.9, adult ([place])  07/19/2016  . Hypothyroidism   . IDA (iron deficiency anemia) 04/23/2016  . Chronic pain syndrome 04/23/2016  . Inflammatory arthritis 04/23/2016  . Chronic leg pain 04/23/2016  . Lesion of palate 04/23/2016  . Grieving 04/23/2016  . GERD (gastroesophageal reflux disease)   . Duodenitis determined by biopsy 02/13/2016  . Diverticulosis of colon without hemorrhage 01/31/2016  . Chest pain 01/11/2012  . Migraine 01/11/2012  . History of methicillin resistant staphylococcus aureus (MRSA) 07/04/2010  . DEGENERATIVE DISC DISEASE 07/04/2010  . History of total right knee replacement 07/04/2010  . Primary osteoarthritis, right shoulder 06/09/2007  . Personal history of colonic adenoma 06/01/2003   Past Medical History:  Diagnosis Date  . Acute kidney failure 08/2008   "cleared up"  no problems since  . Allergy   . Anxiety   . Arthritis   . Asthma    ?of this no inhaler  . Blood transfusion   . CHF (congestive heart failure) ([place])    ?of this, pt denies  . CLOSTRIDIUM DIFFICILE COLITIS 07/04/2010   Annotation: 12/09, 2/10 Qualifier: Diagnosis of  By: [name], [name]    . Depression with anxiety   . Duodenitis determined by biopsy 02/2016   peptic likely due to aleve (erosive gastropathy with duodenal erosions)  . ECZEMA 07/04/2010   Qualifier: Diagnosis of  By: [name], [name]    . Elevated liver enzymes   . Full dentures   . GERD (gastroesophageal reflux disease)   . Heart attack ([place])    08/2008 (likely demand ischemia in the setting of MSSA sepsis/R TKA  infection)10-2008  . History of hiatal hernia   . History of stomach ulcers   . Hypothyroidism   . Lower GI bleeding   . MALAR AND MAXILLARY BONES CLOSED FRACTURE 07/04/2010   Annotation: ORIF Qualifier: Diagnosis of  By: [name], [name]    . Migraine    "definitely"  . MRSA (methicillin resistant Staphylococcus aureus)    in leg, had to place steel rod in leg  . Personal history of colonic adenoma 06/01/2003  . PFO  (patent foramen ovale)    small PFO by 08/2008 TEE  . Pneumonia 08/2008   "while in ICU"  . Seizures ([place]) 2009   "long time ago"    . Sleep apnea    does not wear CPAP  . Stroke [facility]) 2010   unable to complete sentences at times  . Wears glasses     Family History  Problem Relation Age of Onset  . Coronary artery disease Father 10  . Stroke Father   . CAD Mother 30       stents  . Breast cancer Sister   . Diabetes Brother   . Diabetes Sister   . Colon cancer Neg Hx   . Stomach cancer Neg Hx     Past Surgical History:  Procedure Laterality Date  .  anterior  nerve transposition  07/2009   left ulnar nerve  . antibiotic spacer exchange  11/2008; 08/2006   right knee  . ARTHROTOMY  08/2008   right knee w/I&D  . CARDIOVASCULAR STRESS TEST  2013   stress EKG - negative for ischemia, 4 min 7.3 METs, normal blood pressure response  . CATARACT EXTRACTION    . COLONOSCOPY  11/2012   diverticulosis, rpt 10 yrs [name])  . ESOPHAGOGASTRODUODENOSCOPY  02/2016   erosive gastropathy with duodenal erosions [name])  . HARDWARE REMOVAL  04/2006   right knee w/antibiotic spacers placed  . INGUINAL HERNIA REPAIR  early 1990's   bilateral  . JOINT REPLACEMENT Bilateral   . KNEE ARTHROSCOPY  10/2001   right  . KNEE FUSION  03/2009   right knee removal; antibiotic spacers;   . LEFT HEART CATH AND CORONARY ANGIOGRAPHY N/A 02/04/2018    WNL (Patwardhan, Reynold Bowen, MD)  . LUMBAR LAMINECTOMY/DECOMPRESSION MICRODISCECTOMY N/A 12/05/2018   Procedure: LUMBAR THREE TO FOUR AND LUMBAR FOUR TO FIVE DECOMPRESSION;  Surgeon: [name], MD;  Location: [facility];  Service: Orthopedics;  Laterality: N/A;  . MULTIPLE TOOTH EXTRACTIONS    . REPLACEMENT TOTAL KNEE  08/2008; 09/2001   right  . REVERSE SHOULDER ARTHROPLASTY Left 09/03/2017   Procedure: REVERSE SHOULDER ARTHROPLASTY;  Surgeon: Meredith Pel, MD;  Location: [facility];  Service: Orthopedics;  Laterality: Left;  . REVERSE SHOULDER  ARTHROPLASTY Right 05/13/2018   Procedure: RIGHT REVERSE SHOULDER ARTHROPLASTY;  Surgeon: Meredith Pel, MD;  Location: [facility];  Service: Orthopedics;  Laterality: Right;  . rod placement Right   . SYNOVECTOMY  06/2005   debridement, liner exchange right knee  . TOE AMPUTATION Left 08/2008   great toe - osteomyelitis (staph infection)  . TOE AMPUTATION Left 04/2016   2nd toe [name]  . TONSILLECTOMY    . TOTAL KNEE ARTHROPLASTY Left 01/15/2017  . TOTAL KNEE ARTHROPLASTY Left 01/15/2017   Procedure: TOTAL KNEE ARTHROPLASTY;  Surgeon: Meredith Pel, MD;  Location: [facility];  Service: Orthopedics;  Laterality: Left;   Social History   Occupational History  . Occupation: retired  Tobacco Use  . Smoking status: Never Smoker  . Smokeless tobacco: Never Used  Substance and Sexual Activity  . Alcohol use: No    Alcohol/week: 0.0 standard drinks    Comment: "I abused alcohol; last drink  ~ 2005"  . Drug use: No  . Sexual activity: Never

## 2019-12-09 ENCOUNTER — Other Ambulatory Visit: Payer: Self-pay | Admitting: Family Medicine

## 2019-12-09 DIAGNOSIS — E785 Hyperlipidemia, unspecified: Secondary | ICD-10-CM

## 2019-12-09 DIAGNOSIS — E039 Hypothyroidism, unspecified: Secondary | ICD-10-CM

## 2019-12-09 DIAGNOSIS — D509 Iron deficiency anemia, unspecified: Secondary | ICD-10-CM

## 2019-12-09 DIAGNOSIS — Z125 Encounter for screening for malignant neoplasm of prostate: Secondary | ICD-10-CM

## 2019-12-11 ENCOUNTER — Other Ambulatory Visit (INDEPENDENT_AMBULATORY_CARE_PROVIDER_SITE_OTHER): Payer: PPO

## 2019-12-11 ENCOUNTER — Ambulatory Visit (INDEPENDENT_AMBULATORY_CARE_PROVIDER_SITE_OTHER): Payer: PPO

## 2019-12-11 ENCOUNTER — Other Ambulatory Visit: Payer: Self-pay

## 2019-12-11 VITALS — BP 125/72 | Wt 275.0 lb

## 2019-12-11 DIAGNOSIS — D509 Iron deficiency anemia, unspecified: Secondary | ICD-10-CM | POA: Diagnosis not present

## 2019-12-11 DIAGNOSIS — E785 Hyperlipidemia, unspecified: Secondary | ICD-10-CM

## 2019-12-11 DIAGNOSIS — Z125 Encounter for screening for malignant neoplasm of prostate: Secondary | ICD-10-CM

## 2019-12-11 DIAGNOSIS — E039 Hypothyroidism, unspecified: Secondary | ICD-10-CM | POA: Diagnosis not present

## 2019-12-11 DIAGNOSIS — Z0000 Encounter for general adult medical examination without abnormal findings: Secondary | ICD-10-CM | POA: Diagnosis not present

## 2019-12-11 LAB — LIPID PANEL
Cholesterol: 136 mg/dL (ref 0–200)
HDL: 36.8 mg/dL — ABNORMAL LOW (ref >39.00)
NonHDL: 99.05
Total CHOL/HDL Ratio: 4
Triglycerides: 289 mg/dL — ABNORMAL HIGH (ref 0.0–149.0)
VLDL: 57.8 mg/dL — ABNORMAL HIGH (ref 0.0–40.0)

## 2019-12-11 LAB — COMPREHENSIVE METABOLIC PANEL
ALT: 18 U/L (ref 0–53)
AST: 22 U/L (ref 0–37)
Albumin: 3.9 g/dL (ref 3.5–5.2)
Alkaline Phosphatase: 110 U/L (ref 39–117)
BUN: 18 mg/dL (ref 6–23)
CO2: 31 mEq/L (ref 19–32)
Calcium: 9 mg/dL (ref 8.4–10.5)
Chloride: 102 mEq/L (ref 96–112)
Creatinine, Ser: 1.23 mg/dL (ref 0.40–1.50)
GFR: 58.34 mL/min — ABNORMAL LOW (ref >60.00)
Glucose, Bld: 121 mg/dL — ABNORMAL HIGH (ref 70–99)
Potassium: 4.6 mEq/L (ref 3.5–5.1)
Sodium: 139 mEq/L (ref 135–145)
Total Bilirubin: 0.9 mg/dL (ref 0.2–1.2)
Total Protein: 6.9 g/dL (ref 6.0–8.3)

## 2019-12-11 LAB — CBC WITH DIFFERENTIAL/PLATELET
Basophils Absolute: 0.1 10*3/uL (ref 0.0–0.1)
Basophils Relative: 1.1 % (ref 0.0–3.0)
Eosinophils Absolute: 0.3 10*3/uL (ref 0.0–0.7)
Eosinophils Relative: 3.1 % (ref 0.0–5.0)
HCT: 42.9 % (ref 39.0–52.0)
Hemoglobin: 14.3 g/dL (ref 13.0–17.0)
Lymphocytes Relative: 21.9 % (ref 12.0–46.0)
Lymphs Abs: 2.1 10*3/uL (ref 0.7–4.0)
MCHC: 33.4 g/dL (ref 30.0–36.0)
MCV: 93.6 fl (ref 78.0–100.0)
Monocytes Absolute: 1.2 10*3/uL — ABNORMAL HIGH (ref 0.1–1.0)
Monocytes Relative: 13 % — ABNORMAL HIGH (ref 3.0–12.0)
Neutro Abs: 5.8 10*3/uL (ref 1.4–7.7)
Neutrophils Relative %: 60.9 % (ref 43.0–77.0)
Platelets: 251 10*3/uL (ref 150.0–400.0)
RBC: 4.59 Mil/uL (ref 4.22–5.81)
RDW: 15.6 % — ABNORMAL HIGH (ref 11.5–15.5)
WBC: 9.5 10*3/uL (ref 4.0–10.5)

## 2019-12-11 LAB — IBC PANEL
Iron: 45 ug/dL (ref 42–165)
Saturation Ratios: 13.2 % — ABNORMAL LOW (ref 20.0–50.0)
Transferrin: 244 mg/dL (ref 212.0–360.0)

## 2019-12-11 LAB — PSA, MEDICARE: PSA: 1.04 ng/ml (ref 0.10–4.00)

## 2019-12-11 LAB — TSH: TSH: 5.04 u[IU]/mL — ABNORMAL HIGH (ref 0.35–4.50)

## 2019-12-11 LAB — FERRITIN: Ferritin: 29.9 ng/mL (ref 22.0–322.0)

## 2019-12-11 LAB — LDL CHOLESTEROL, DIRECT: Direct LDL: 60 mg/dL

## 2019-12-11 NOTE — Progress Notes (Signed)
PCP notes:  Health Maintenance: Flu vaccine- due Pneumovax 23- due   Abnormal Screenings: none   Patient concerns: Sore on left foot Right hand pain    Nurse concerns: none   Next PCP appt.: 12/14/2019 @ 11:30 am

## 2019-12-11 NOTE — Progress Notes (Signed)
Subjective:   Nicholas [name] is a 69 y.o. male who presents for Medicare Annual/Subsequent preventive examination.  Review of Systems: N/A   This visit is being conducted through telemedicine via telephone at the nurse health advisor's home address due to the COVID-19 pandemic. This patient has given me verbal consent via doximity to conduct this visit, patient states they are participating from their home address. Patient and myself are on the telephone call. There is no referral for this visit. Some vital signs may be absent or patient reported.    Patient identification: identified by name, DOB, and current address   Cardiac Risk Factors include: advanced age (>32men, >5 women);dyslipidemia;male gender     Objective:    Vitals: BP 125/72   Wt 275 lb (124.7 kg)   BMI 38.90 kg/m   Body mass index is 38.9 kg/m.  Advanced Directives 12/11/2019 12/10/2018 12/05/2018 12/05/2018 11/18/2018 05/13/2018 05/05/2018  Does Patient Have a Medical Advance Directive? No No No No No No No  Copy of Healthcare Power of Attorney in Chart? - - - - - - -  Would patient like information on creating a medical advance directive? No - Patient declined No - Patient declined No - Patient declined No - Patient declined No - Patient declined Yes (Inpatient - patient defers creating a medical advance directive at this time) No - Patient declined    Tobacco Social History   Tobacco Use  Smoking Status Never Smoker  Smokeless Tobacco Never Used     Counseling given: Not Answered   Clinical Intake:  Pre-visit preparation completed: Yes  Pain : 0-10 Pain Score: 7  Pain Type: Chronic pain Pain Location: Leg(and back) Pain Orientation: Right Pain Descriptors / Indicators: Aching Pain Onset: More than a month ago Pain Frequency: Intermittent     Nutritional Risks: None Diabetes: No  How often do you need to have someone help you when you read instructions, pamphlets, or other written materials from  your doctor or pharmacy?: 1 - Never What is the last grade level you completed in school?: some college  Interpreter Needed?: No  Information entered by :: Raymond, LPN  Past Medical History:  Diagnosis Date  . Acute kidney failure 08/2008   "cleared up"  no problems since  . Allergy   . Anxiety   . Arthritis   . Asthma    ?of this no inhaler  . Blood transfusion   . CHF (congestive heart failure) ([place])    ?of this, pt denies  . CLOSTRIDIUM DIFFICILE COLITIS 07/04/2010   Annotation: 12/09, 2/10 Qualifier: Diagnosis of  By: [name], [name]    . Depression with anxiety   . Duodenitis determined by biopsy 02/2016   peptic likely due to aleve (erosive gastropathy with duodenal erosions)  . ECZEMA 07/04/2010   Qualifier: Diagnosis of  By: [name], [name]    . Elevated liver enzymes   . Full dentures   . GERD (gastroesophageal reflux disease)   . Heart attack ([place])    08/2008 (likely demand ischemia in the setting of MSSA sepsis/R TKA  infection)10-2008  . History of hiatal hernia   . History of stomach ulcers   . Hypothyroidism   . Lower GI bleeding   . MALAR AND MAXILLARY BONES CLOSED FRACTURE 07/04/2010   Annotation: ORIF Qualifier: Diagnosis of  By: [name], [name]    . Migraine    "definitely"  . MRSA (methicillin resistant Staphylococcus aureus)    in leg, had to  place steel rod in leg  . Personal history of colonic adenoma 06/01/2003  . PFO (patent foramen ovale)    small PFO by 08/2008 TEE  . Pneumonia 08/2008   "while in ICU"  . Seizures ([place]) 2009   "long time ago"    . Sleep apnea    does not wear CPAP  . Stroke [facility]) 2010   unable to complete sentences at times  . Wears glasses    Past Surgical History:  Procedure Laterality Date  . anterior  nerve transposition  07/2009   left ulnar nerve  . antibiotic spacer exchange  11/2008; 08/2006   right knee  . ARTHROTOMY  08/2008   right knee w/I&D  . CARDIOVASCULAR STRESS TEST  2013   stress EKG -  negative for ischemia, 4 min 7.3 METs, normal blood pressure response  . CATARACT EXTRACTION    . COLONOSCOPY  11/2012   diverticulosis, rpt 10 yrs [name])  . ESOPHAGOGASTRODUODENOSCOPY  02/2016   erosive gastropathy with duodenal erosions [name])  . HARDWARE REMOVAL  04/2006   right knee w/antibiotic spacers placed  . INGUINAL HERNIA REPAIR  early 1990's   bilateral  . JOINT REPLACEMENT Bilateral   . KNEE ARTHROSCOPY  10/2001   right  . KNEE FUSION  03/2009   right knee removal; antibiotic spacers;   . LEFT HEART CATH AND CORONARY ANGIOGRAPHY N/A 02/04/2018    WNL (Patwardhan, Reynold Bowen, MD)  . LUMBAR LAMINECTOMY/DECOMPRESSION MICRODISCECTOMY N/A 12/05/2018   Procedure: LUMBAR THREE TO FOUR AND LUMBAR FOUR TO FIVE DECOMPRESSION;  Surgeon: [name], MD;  Location: [facility];  Service: Orthopedics;  Laterality: N/A;  . MULTIPLE TOOTH EXTRACTIONS    . REPLACEMENT TOTAL KNEE  08/2008; 09/2001   right  . REVERSE SHOULDER ARTHROPLASTY Left 09/03/2017   Procedure: REVERSE SHOULDER ARTHROPLASTY;  Surgeon: Meredith Pel, MD;  Location: [facility];  Service: Orthopedics;  Laterality: Left;  . REVERSE SHOULDER ARTHROPLASTY Right 05/13/2018   Procedure: RIGHT REVERSE SHOULDER ARTHROPLASTY;  Surgeon: Meredith Pel, MD;  Location: [facility];  Service: Orthopedics;  Laterality: Right;  . rod placement Right   . SYNOVECTOMY  06/2005   debridement, liner exchange right knee  . TOE AMPUTATION Left 08/2008   great toe - osteomyelitis (staph infection)  . TOE AMPUTATION Left 04/2016   2nd toe [name]  . TONSILLECTOMY    . TOTAL KNEE ARTHROPLASTY Left 01/15/2017  . TOTAL KNEE ARTHROPLASTY Left 01/15/2017   Procedure: TOTAL KNEE ARTHROPLASTY;  Surgeon: Meredith Pel, MD;  Location: [facility];  Service: Orthopedics;  Laterality: Left;   Family History  Problem Relation Age of Onset  . Coronary artery disease Father 4  . Stroke Father   . CAD Mother 39       stents  . Breast cancer Sister   .  Diabetes Brother   . Diabetes Sister   . Colon cancer Neg Hx   . Stomach cancer Neg Hx    Social History   Socioeconomic History  . Marital status: Widowed    Spouse name: Not on file  . Number of children: 2  . Years of education: 40  . Highest education level: Not on file  Occupational History  . Occupation: retired  Tobacco Use  . Smoking status: Never Smoker  . Smokeless tobacco: Never Used  Substance and Sexual Activity  . Alcohol use: No    Alcohol/week: 0.0 standard drinks    Comment: "I abused alcohol; last drink  ~  2005"  . Drug use: No  . Sexual activity: Never  Other Topics Concern  . Not on file  Social History Narrative   Widower 2016 - wife passed from ESRD related illness   Lives in [place] in [place].    M[employer]on   Occ: landscaping   Edu: HS   Activity: no regular exercise    Diet: good water, fruits/vegetables daily      Screened positive for OSA 04/2018   Social Determinants of Health   Financial Resource Strain: Low Risk   . Difficulty of Paying Living Expenses: Not hard at all  Food Insecurity: No Food Insecurity  . Worried About Charity fundraiser in the Last Year: Never true  . Ran Out of Food in the Last Year: Never true  Transportation Needs: No Transportation Needs  . Lack of Transportation (Medical): No  . Lack of Transportation (Non-Medical): No  Physical Activity: Inactive  . Days of Exercise per Week: 0 days  . Minutes of Exercise per Session: 0 min  Stress: No Stress Concern Present  . Feeling of Stress : Not at all  Social Connections:   . Frequency of Communication with Friends and Family: Not on file  . Frequency of Social Gatherings with Friends and Family: Not on file  . Attends Religious Services: Not on file  . Active Member of Clubs or Organizations: Not on file  . Attends Archivist Meetings: Not on file  . Marital Status: Not on file    Outpatient Encounter Medications as of 12/11/2019   Medication Sig  . acetaminophen (TYLENOL) 500 MG tablet Take 1,000 mg by mouth 2 (two) times daily. IN THE AFTERNOON & AT NIGHT  . amitriptyline (ELAVIL) 50 MG tablet TAKE 1-2 TABLETS (50-100 MG TOTAL) BY MOUTH AT BEDTIME.  [name] atorvastatin (LIPITOR) 20 MG tablet TAKE 1 TABLET BY MOUTH EVERY DAY  . clotrimazole (LOTRIMIN) 1 % cream Apply 1 application topically 2 (two) times daily. To groin (Patient taking differently: Apply 1 application topically 3 (three) times daily. To groin)  . EPINEPHrine 0.3 mg/0.3 mL IJ SOAJ injection Inject 0.3 mLs (0.3 mg total) into the muscle as needed for anaphylaxis. (bee stings)  . furosemide (LASIX) 20 MG tablet TAKE 1 TABLET BY MOUTH EVERY DAY AS NEEDED FOR FLUID OR EDEMA  . gabapentin (NEURONTIN) 300 MG capsule TAKE 1 CAPSULE BY MOUTH IN THE MORNING, 2 CAPSULES IN THE AFTERNOON THEN 1 CAPSULE AT NIGHT  . ketoconazole (NIZORAL) 2 % shampoo Apply 1 application topically 2 (two) times a week. (Patient taking differently: Apply 1 application topically 3 (three) times a week. )  . levothyroxine (SYNTHROID) 100 MCG tablet Take 1 tablet (100 mcg total) by mouth daily before breakfast.  . metoprolol succinate (TOPROL-XL) 100 MG 24 hr tablet TAKE 1 TABLET BY MOUTH EVERY DAY  . nitroGLYCERIN (NITROSTAT) 0.4 MG SL tablet Place 0.4 mg under the tongue every 5 (five) minutes as needed for chest pain.  . pantoprazole (PROTONIX) 40 MG tablet TAKE 1 TABLET BY MOUTH EVERY DAY  . SUMAtriptan (IMITREX) 100 MG tablet TAKE 1 TAB AS NEEDED FOR MIGRAINE (REPEAT IN 2 HOURS IF NEEDED). LIMIT TO 2/24 HOUR PERIOD  . methocarbamol (ROBAXIN) 500 MG tablet Take 1 tablet (500 mg total) by mouth every 8 (eight) hours as needed. (Patient not taking: Reported on 12/11/2019)   No facility-administered encounter medications on file as of 12/11/2019.    Activities of Daily Living In your present state of  health, do you have any difficulty performing the following activities: 12/11/2019  Hearing? N   Vision? N  Difficulty concentrating or making decisions? N  Walking or climbing stairs? N  Dressing or bathing? N  Doing errands, shopping? N  Preparing Food and eating ? N  Using the Toilet? N  In the past six months, have you accidently leaked urine? N  Do you have problems with loss of bowel control? N  Managing your Medications? N  Managing your Finances? N  Housekeeping or managing your Housekeeping? N  Some recent data might be hidden    Patient Care Team: [name], MD as PCP - General (Family Medicine) Adrian Prows, MD (Cardiology)   Assessment:   This is a routine wellness examination for Casey.  Exercise Activities and Dietary recommendations Current Exercise Habits: The patient does not participate in regular exercise at present, Exercise limited by: None identified  Goals    . Increase physical activity     Starting 12/10/18 and weather permitting, I will continue to walk at last 15 minutes daily.     . Patient Stated     12/11/2019, I will maintain and continue medications as prescribed.        Fall Risk Fall Risk  12/11/2019 12/10/2018 11/18/2017 10/05/2016 02/08/2016  Falls in the past year? 1 0 Yes No No  Comment tripped and fell - lost balance and fell - -  Number falls in past yr: 0 - 1 - -  Injury with Fall? 0 - No - -  Risk for fall due to : Medication side effect;Impaired mobility - - - -  Follow up Falls evaluation completed;Falls prevention discussed - - - -   Is the patient's home free of loose throw rugs in walkways, pet beds, electrical cords, etc?   yes      Grab bars in the bathroom? yes      Handrails on the stairs?   yes      Adequate lighting?   yes  Timed Get Up and Go Performed: N/A  Depression Screen PHQ 2/9 Scores 12/11/2019 12/10/2018 11/18/2017 10/05/2016  PHQ - 2 Score 0 0 0 3  PHQ- 9 Score 0 0 0 8    Cognitive Function MMSE - Mini Mental State Exam 12/11/2019 12/10/2018 11/18/2017  Orientation to time 5 5 5   Orientation to  Place 5 5 5   Registration 3 3 3   Attention/ Calculation 5 0 0  Recall 3 3 2   Recall-comments - - unable to recall 1 of 3 words  Language- name 2 objects - 0 0  Language- repeat 1 1 1   Language- follow 3 step command - 3 3  Language- read & follow direction - 0 0  Write a sentence - 0 0  Copy design - 0 0  Total score - 20 19  Mini Cog  Mini-Cog screen was completed. Maximum score is 22. A value of 0 denotes this part of the MMSE was not completed or the patient failed this part of the Mini-Cog screening.       Immunization History  Administered Date(s) Administered  . Influenza, High Dose Seasonal PF 08/15/2017  . Influenza,inj,Quad PF,6+ Mos 07/01/2013  . Influenza-Unspecified 08/16/2011, 07/18/2016, 06/03/2018  . Pneumococcal Conjugate-13 10/05/2016  . Pneumococcal Polysaccharide-23 08/16/2011  . Td 01/30/2006  . Zoster 10/15/2012  . Zoster Recombinat (Shingrix) 12/12/2017, 04/22/2018    Qualifies for Shingles Vaccine: Completed series  Screening Tests Health Maintenance  Topic Date Due  . DTAP  VACCINES (1) 01/19/1951  . PNA vac Low Risk Adult (2 of 2 - PPSV23) 10/05/2017  . INFLUENZA VACCINE  05/16/2019  . DTaP/Tdap/Td (2 - Tdap) 10/14/2020 (Originally 01/31/2016)  . TETANUS/TDAP  10/14/2020 (Originally 01/31/2016)  . COLONOSCOPY  12/11/2022  . Hepatitis C Screening  Completed   Cancer Screenings: Lung: Low Dose CT Chest recommended if Age 18-80 years, 30 pack-year currently smoking OR have quit w/in 15 years. Patient does not qualify. Colorectal: completed 12/11/2012  Additional Screenings:  Hepatitis C Screening: 04/12/2016      Plan:   Patient will maintain and continue medications as prescribed.  I have personally reviewed and noted the following in the patient's chart:   . Medical and social history . Use of alcohol, tobacco or illicit drugs  . Current medications and supplements . Functional ability and status . Nutritional status . Physical activity  . Advanced directives . List of other physicians . Hospitalizations, surgeries, and ER visits in previous 12 months . Vitals . Screenings to include cognitive, depression, and falls . Referrals and appointments  In addition, I have reviewed and discussed with patient certain preventive protocols, quality metrics, and best practice recommendations. A written personalized care plan for preventive services as well as general preventive health recommendations were provided to patient.     Andrez Grime, LPN  [phone]

## 2019-12-11 NOTE — Patient Instructions (Addendum)
[name] , Thank you for taking time to come for your Medicare Wellness Visit. I appreciate your ongoing commitment to your health goals. Please review the following plan we discussed and let me know if I can assist you in the future.   Screening recommendations/referrals: Colonoscopy: Up to date, completed 12/11/2012 Recommended yearly ophthalmology/optometry visit for glaucoma screening and checkup Recommended yearly dental visit for hygiene and checkup.  Vaccinations: Influenza vaccine: due Pneumococcal vaccine: due Tdap vaccine: decline Shingles vaccine: Completed series    Advanced directives: Advance directive discussed with you today. Even though you declined this today please call our office should you change your mind and we can give you the proper paperwork for you to fill out.  Conditions/risks identified: hyperlipidemia  Next appointment: 12/14/2019 @ 11:30 am   Preventive Care 65 Years and Older, Male Preventive care refers to lifestyle choices and visits with your health care provider that can promote health and wellness. What does preventive care include?  A yearly physical exam. This is also called an annual well check.  Dental exams once or twice a year.  Routine eye exams. Ask your health care provider how often you should have your eyes checked.  Personal lifestyle choices, including:  Daily care of your teeth and gums.  Regular physical activity.  Eating a healthy diet.  Avoiding tobacco and drug use.  Limiting alcohol use.  Practicing safe sex.  Taking low doses of aspirin every day.  Taking vitamin and mineral supplements as recommended by your health care provider. What happens during an annual well check? The services and screenings done by your health care provider during your annual well check will depend on your age, overall health, lifestyle risk factors, and family history of disease. Counseling  Your health care provider may ask you  questions about your:  Alcohol use.  Tobacco use.  Drug use.  Emotional well-being.  Home and relationship well-being.  Sexual activity.  Eating habits.  History of falls.  Memory and ability to understand (cognition).  Work and work Statistician. Screening  You may have the following tests or measurements:  Height, weight, and BMI.  Blood pressure.  Lipid and cholesterol levels. These may be checked every 5 years, or more frequently if you are over 47 years old.  Skin check.  Lung cancer screening. You may have this screening every year starting at age 38 if you have a 30-pack-year history of smoking and currently smoke or have quit within the past 15 years.  Fecal occult blood test (FOBT) of the stool. You may have this test every year starting at age 68.  Flexible sigmoidoscopy or colonoscopy. You may have a sigmoidoscopy every 5 years or a colonoscopy every 10 years starting at age 68.  Prostate cancer screening. Recommendations will vary depending on your family history and other risks.  Hepatitis C blood test.  Hepatitis B blood test.  Sexually transmitted disease (STD) testing.  Diabetes screening. This is done by checking your blood sugar (glucose) after you have not eaten for a while (fasting). You may have this done every 1-3 years.  Abdominal aortic aneurysm (AAA) screening. You may need this if you are a current or former smoker.  Osteoporosis. You may be screened starting at age 25 if you are at high risk. Talk with your health care provider about your test results, treatment options, and if necessary, the need for more tests. Vaccines  Your health care provider may recommend certain vaccines, such as:  Influenza  vaccine. This is recommended every year.  Tetanus, diphtheria, and acellular pertussis (Tdap, Td) vaccine. You may need a Td booster every 10 years.  Zoster vaccine. You may need this after age 51.  Pneumococcal 13-valent conjugate  (PCV13) vaccine. One dose is recommended after age 66.  Pneumococcal polysaccharide (PPSV23) vaccine. One dose is recommended after age 59. Talk to your health care provider about which screenings and vaccines you need and how often you need them. This information is not intended to replace advice given to you by your health care provider. Make sure you discuss any questions you have with your health care provider. Document Released: 10/28/2015 Document Revised: 06/20/2016 Document Reviewed: 08/02/2015 Elsevier Interactive Patient Education  2017 [employer] Prevention in the Home Falls can cause injuries. They can happen to people of all ages. There are many things you can do to make your home safe and to help prevent falls. What can I do on the outside of my home?  Regularly fix the edges of walkways and driveways and fix any cracks.  Remove anything that might make you trip as you walk through a door, such as a raised step or threshold.  Trim any bushes or trees on the path to your home.  Use bright outdoor lighting.  Clear any walking paths of anything that might make someone trip, such as rocks or tools.  Regularly check to see if handrails are loose or broken. Make sure that both sides of any steps have handrails.  Any raised decks and porches should have guardrails on the edges.  Have any leaves, snow, or ice cleared regularly.  Use sand or salt on walking paths during winter.  Clean up any spills in your garage right away. This includes oil or grease spills. What can I do in the bathroom?  Use night lights.  Install grab bars by the toilet and in the tub and shower. Do not use towel bars as grab bars.  Use non-skid mats or decals in the tub or shower.  If you need to sit down in the shower, use a plastic, non-slip stool.  Keep the floor dry. Clean up any water that spills on the floor as soon as it happens.  Remove soap buildup in the tub or shower  regularly.  Attach bath mats securely with double-sided non-slip rug tape.  Do not have throw rugs and other things on the floor that can make you trip. What can I do in the bedroom?  Use night lights.  Make sure that you have a light by your bed that is easy to reach.  Do not use any sheets or blankets that are too big for your bed. They should not hang down onto the floor.  Have a firm chair that has side arms. You can use this for support while you get dressed.  Do not have throw rugs and other things on the floor that can make you trip. What can I do in the kitchen?  Clean up any spills right away.  Avoid walking on wet floors.  Keep items that you use a lot in easy-to-reach places.  If you need to reach something above you, use a strong step stool that has a grab bar.  Keep electrical cords out of the way.  Do not use floor polish or wax that makes floors slippery. If you must use wax, use non-skid floor wax.  Do not have throw rugs and other things on the floor that can  make you trip. What can I do with my stairs?  Do not leave any items on the stairs.  Make sure that there are handrails on both sides of the stairs and use them. Fix handrails that are broken or loose. Make sure that handrails are as long as the stairways.  Check any carpeting to make sure that it is firmly attached to the stairs. Fix any carpet that is loose or worn.  Avoid having throw rugs at the top or bottom of the stairs. If you do have throw rugs, attach them to the floor with carpet tape.  Make sure that you have a light switch at the top of the stairs and the bottom of the stairs. If you do not have them, ask someone to add them for you. What else can I do to help prevent falls?  Wear shoes that:  Do not have high heels.  Have rubber bottoms.  Are comfortable and fit you well.  Are closed at the toe. Do not wear sandals.  If you use a stepladder:  Make sure that it is fully  opened. Do not climb a closed stepladder.  Make sure that both sides of the stepladder are locked into place.  Ask someone to hold it for you, if possible.  Clearly mark and make sure that you can see:  Any grab bars or handrails.  First and last steps.  Where the edge of each step is.  Use tools that help you move around (mobility aids) if they are needed. These include:  Canes.  Walkers.  Scooters.  Crutches.  Turn on the lights when you go into a dark area. Replace any light bulbs as soon as they burn out.  Set up your furniture so you have a clear path. Avoid moving your furniture around.  If any of your floors are uneven, fix them.  If there are any pets around you, be aware of where they are.  Review your medicines with your doctor. Some medicines can make you feel dizzy. This can increase your chance of falling. Ask your doctor what other things that you can do to help prevent falls. This information is not intended to replace advice given to you by your health care provider. Make sure you discuss any questions you have with your health care provider. Document Released: 07/28/2009 Document Revised: 03/08/2016 Document Reviewed: 11/05/2014 Elsevier Interactive Patient Education  2017 [employer].

## 2019-12-14 ENCOUNTER — Ambulatory Visit (INDEPENDENT_AMBULATORY_CARE_PROVIDER_SITE_OTHER): Payer: PPO | Admitting: Family Medicine

## 2019-12-14 ENCOUNTER — Ambulatory Visit: Payer: Self-pay

## 2019-12-14 ENCOUNTER — Other Ambulatory Visit: Payer: Self-pay

## 2019-12-14 ENCOUNTER — Ambulatory Visit (INDEPENDENT_AMBULATORY_CARE_PROVIDER_SITE_OTHER)
Admission: RE | Admit: 2019-12-14 | Discharge: 2019-12-14 | Disposition: A | Discharge status: Home / Self Care | Payer: PPO | Source: Ambulatory Visit | Attending: Family Medicine | Admitting: Family Medicine

## 2019-12-14 ENCOUNTER — Encounter: Payer: Self-pay | Admitting: Family Medicine

## 2019-12-14 VITALS — BP 120/76 | HR 61 | Temp 97.9°F | Ht 69.5 in | Wt 279.3 lb

## 2019-12-14 DIAGNOSIS — Z9889 Other specified postprocedural states: Secondary | ICD-10-CM

## 2019-12-14 DIAGNOSIS — M1990 Unspecified osteoarthritis, unspecified site: Secondary | ICD-10-CM | POA: Diagnosis not present

## 2019-12-14 DIAGNOSIS — M79641 Pain in right hand: Secondary | ICD-10-CM | POA: Diagnosis not present

## 2019-12-14 DIAGNOSIS — Z7189 Other specified counseling: Secondary | ICD-10-CM

## 2019-12-14 DIAGNOSIS — M19041 Primary osteoarthritis, right hand: Secondary | ICD-10-CM | POA: Diagnosis not present

## 2019-12-14 DIAGNOSIS — Z0000 Encounter for general adult medical examination without abnormal findings: Secondary | ICD-10-CM

## 2019-12-14 DIAGNOSIS — E039 Hypothyroidism, unspecified: Secondary | ICD-10-CM | POA: Diagnosis not present

## 2019-12-14 DIAGNOSIS — M7989 Other specified soft tissue disorders: Secondary | ICD-10-CM | POA: Diagnosis not present

## 2019-12-14 DIAGNOSIS — E611 Iron deficiency: Secondary | ICD-10-CM | POA: Diagnosis not present

## 2019-12-14 DIAGNOSIS — E785 Hyperlipidemia, unspecified: Secondary | ICD-10-CM | POA: Diagnosis not present

## 2019-12-14 DIAGNOSIS — M48061 Spinal stenosis, lumbar region without neurogenic claudication: Secondary | ICD-10-CM | POA: Diagnosis not present

## 2019-12-14 DIAGNOSIS — M79672 Pain in left foot: Secondary | ICD-10-CM

## 2019-12-14 DIAGNOSIS — G894 Chronic pain syndrome: Secondary | ICD-10-CM | POA: Diagnosis not present

## 2019-12-14 DIAGNOSIS — K2100 Gastro-esophageal reflux disease with esophagitis, without bleeding: Secondary | ICD-10-CM

## 2019-12-14 DIAGNOSIS — Z89412 Acquired absence of left great toe: Secondary | ICD-10-CM | POA: Insufficient documentation

## 2019-12-14 DIAGNOSIS — L84 Corns and callosities: Secondary | ICD-10-CM

## 2019-12-14 DIAGNOSIS — Z6841 Body Mass Index (BMI) 40.0 and over, adult: Secondary | ICD-10-CM

## 2019-12-14 MED ORDER — NITROGLYCERIN 0.4 MG SL SUBL: 0.4000 mg | SUBLINGUAL_TABLET | SUBLINGUAL | 3 refills | Status: DC | PRN

## 2019-12-14 MED ORDER — KETOCONAZOLE 2 % EX SHAM: 1.0000 "application " | MEDICATED_SHAMPOO | CUTANEOUS | 3 refills | Status: DC

## 2019-12-14 MED ORDER — EPINEPHRINE 0.3 MG/0.3ML IJ SOAJ: 0.3000 mg | INTRAMUSCULAR | 1 refills | Status: DC | PRN

## 2019-12-14 NOTE — Patient Instructions (Addendum)
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  Iron was a bit low - start ferrous sulfate 325mg  (65FE) iron pill over the counter every other day.  We will watch thyroid levels. Work on healthy diet to lower triglycerides.  Increase water for kidney health.  Back off sodas. Increase water and flavored water.  Good to see you today  Xray today We will refer you to podiatry Return in 6 months for follow up visit.   Health Maintenance After Age 69 After age 65, you are at a higher risk for certain long-term diseases and infections as well as injuries from falls. Falls are a major cause of broken bones and head injuries in people who are older than age 45. Getting regular preventive care can help to keep you healthy and well. Preventive care includes getting regular testing and making lifestyle changes as recommended by your health care provider. Talk with your health care provider about:  Which screenings and tests you should have. A screening is a test that checks for a disease when you have no symptoms.  A diet and exercise plan that is right for you. What should I know about screenings and tests to prevent falls? Screening and testing are the best ways to find a health problem early. Early diagnosis and treatment give you the best chance of managing medical conditions that are common after age 42. Certain conditions and lifestyle choices may make you more likely to have a fall. Your health care provider may recommend:  Regular vision checks. Poor vision and conditions such as cataracts can make you more likely to have a fall. If you wear glasses, make sure to get your prescription updated if your vision changes.  Medicine review. Work with your health care provider to regularly review all of the medicines you are taking, including over-the-counter medicines. Ask your health care provider about any side effects that may make you more likely to have a fall. Tell  your health care provider if any medicines that you take make you feel dizzy or sleepy.  Osteoporosis screening. Osteoporosis is a condition that causes the bones to get weaker. This can make the bones weak and cause them to break more easily.  Blood pressure screening. Blood pressure changes and medicines to control blood pressure can make you feel dizzy.  Strength and balance checks. Your health care provider may recommend certain tests to check your strength and balance while standing, walking, or changing positions.  Foot health exam. Foot pain and numbness, as well as not wearing proper footwear, can make you more likely to have a fall.  Depression screening. You may be more likely to have a fall if you have a fear of falling, feel emotionally low, or feel unable to do activities that you used to do.  Alcohol use screening. Using too much alcohol can affect your balance and may make you more likely to have a fall. What actions can I take to lower my risk of falls? General instructions  Talk with your health care provider about your risks for falling. Tell your health care provider if: ? You fall. Be sure to tell your health care provider about all falls, even ones that seem minor. ? You feel dizzy, sleepy, or off-balance.  Take over-the-counter and prescription medicines only as told by your health care provider. These include any supplements.  Eat a healthy diet and maintain a healthy weight. A healthy diet includes low-fat dairy products,  low-fat (lean) meats, and fiber from whole grains, beans, and lots of fruits and vegetables. Home safety  Remove any tripping hazards, such as rugs, cords, and clutter.  Install safety equipment such as grab bars in bathrooms and safety rails on stairs.  Keep rooms and walkways well-lit. Activity   Follow a regular exercise program to stay fit. This will help you maintain your balance. Ask your health care provider what types of exercise are  appropriate for you.  If you need a cane or walker, use it as recommended by your health care provider.  Wear supportive shoes that have nonskid soles. Lifestyle  Do not drink alcohol if your health care provider tells you not to drink.  If you drink alcohol, limit how much you have: ? 0-1 drink a day for women. ? 0-2 drinks a day for men.  Be aware of how much alcohol is in your drink. In the [place] one drink equals one typical bottle of beer (12 oz), one-half glass of wine (5 oz), or one shot of hard liquor (1 oz).  Do not use any products that contain nicotine or tobacco, such as cigarettes and e-cigarettes. If you need help quitting, ask your health care provider. Summary  Having a healthy lifestyle and getting preventive care can help to protect your health and wellness after age 24.  Screening and testing are the best way to find a health problem early and help you avoid having a fall. Early diagnosis and treatment give you the best chance for managing medical conditions that are more common for people who are older than age 91.  Falls are a major cause of broken bones and head injuries in people who are older than age 52. Take precautions to prevent a fall at home.  Work with your health care provider to learn what changes you can make to improve your health and wellness and to prevent falls. This information is not intended to replace advice given to you by your health care provider. Make sure you discuss any questions you have with your health care provider. Document Revised: 01/22/2019 Document Reviewed: 08/14/2017 Elsevier Patient Education  2020 [employer].

## 2019-12-14 NOTE — Assessment & Plan Note (Signed)
TSH elevated - patient denies hypothyroid symptoms so no changes at this time.

## 2019-12-14 NOTE — Assessment & Plan Note (Addendum)
Will work on this - has packet at home

## 2019-12-14 NOTE — Assessment & Plan Note (Signed)
Stable period - continue to encourage healthy diet choices.

## 2019-12-14 NOTE — Assessment & Plan Note (Signed)
Preventative protocols reviewed and updated unless pt declined. Discussed healthy diet and lifestyle.  

## 2019-12-14 NOTE — Progress Notes (Signed)
This visit was conducted in person.  BP 120/76 (BP Location: Left Arm, Patient Position: Sitting, Cuff Size: Large)   Pulse 61   Temp 97.9 F (36.6 C) (Temporal)   Ht 5' 9.5" (1.765 m)   Wt 279 lb 5 oz (126.7 kg)   SpO2 95%   BMI 40.66 kg/m    CC: CPE Subjective:    Patient ID: Nicholas [name], male    DOB: Feb 21, 1951, 69 y.o.   MRN: [id number]  HPI: Nicholas [name] is a 69 y.o. male presenting on 12/14/2019 for Annual Exam (Prt 2. )   Saw health advisor last week for medicare wellness visit. Note reviewed.   No exam data present    Clinical Support from 12/11/2019 in [place] at [facility] Total Score  0      Fall Risk  12/11/2019 12/10/2018 11/18/2017 10/05/2016 02/08/2016  Falls in the past year? 1 0 Yes No No  Comment tripped and fell - lost balance and fell - -  Number falls in past yr: 0 - 1 - -  Injury with Fall? 0 - No - -  Risk for fall due to : Medication side effect;Impaired mobility - - - -  Follow up Falls evaluation completed;Falls prevention discussed - - - -     L3/4 L4/5 decompression 11/2018 for severe spinal stenosis [name]) with improved back pain. Now starting to notice some R lower back pain.   R hand staying very sore for the past month. Denies inciting trauma/injury or fall. Notes some swelling of the 3rd MCPJ. Present when awakening, persists throughout the day. Hasn't tried anything for this yet. Heat may help.   Wife when she was living would mention he moved a lot in his sleep, could thrash hands as well. Wonders if injured his hand this way.   Area to L sole present for several months. Hasn't treated with anything. S/p 1st/2nd L toe amputations for osteomyelitis with gangrene.   Preventative: COLONOSCOPY 11/2012 diverticulosis, rpt 10 yrs [name]) Prostate cancer screening -declinesDRE, agrees to PSA next time Lung cancer screening -not eligible  DEXA - pt states he had this done at [facility]  Flu shotyearly - didn't get  this year  Td 2007  Pneumovax 2012, prevnar2017  zostavax - ~2014  Shingrix - completed 2019  Covid vaccine - declines  Advanced directive discussion -has not set up. Sonwould beHCPOA.  Packet previously provided.  Seat belt use discussed  Sunscreen usediscussed. No changing moles on skin.  Non smoker  Alcohol - none Dentist yearly Eye exam yearly Bowel - no constipation - occasional loose stools Bladder - no incontinence  Widower 2016 - wife passed from ESRD related illness Lives in [place] in [place]. [employer]: landscaping Edu: HS Activity: no regular exercise - residual deficits due to multiple surgeries  Diet: some water, fruits/vegetables daily      Relevant past medical, surgical, family and social history reviewed and updated as indicated. Interim medical history since our last visit reviewed. Allergies and medications reviewed and updated. Outpatient Medications Prior to Visit  Medication Sig Dispense Refill  . acetaminophen (TYLENOL) 500 MG tablet Take 1,000 mg by mouth 2 (two) times daily. IN THE AFTERNOON & AT NIGHT    . amitriptyline (ELAVIL) 50 MG tablet TAKE 1-2 TABLETS (50-100 MG TOTAL) BY MOUTH AT BEDTIME. 180 tablet 3  . atorvastatin (LIPITOR) 20 MG tablet TAKE 1 TABLET BY MOUTH EVERY DAY 90 tablet 0  .  clotrimazole (LOTRIMIN) 1 % cream Apply 1 application topically 2 (two) times daily. To groin (Patient taking differently: Apply 1 application topically 3 (three) times daily. To groin) 60 g 1  . furosemide (LASIX) 20 MG tablet TAKE 1 TABLET BY MOUTH EVERY DAY AS NEEDED FOR FLUID OR EDEMA 90 tablet 1  . gabapentin (NEURONTIN) 300 MG capsule TAKE 1 CAPSULE BY MOUTH IN THE MORNING, 2 CAPSULES IN THE AFTERNOON THEN 1 CAPSULE AT NIGHT 360 capsule 1  . levothyroxine (SYNTHROID) 100 MCG tablet Take 1 tablet (100 mcg total) by mouth daily before breakfast. 90 tablet 3  . methocarbamol (ROBAXIN) 500 MG tablet Take 1 tablet (500 mg total) by mouth  every 8 (eight) hours as needed. 30 tablet 0  . metoprolol succinate (TOPROL-XL) 100 MG 24 hr tablet TAKE 1 TABLET BY MOUTH EVERY DAY 90 tablet 1  . pantoprazole (PROTONIX) 40 MG tablet TAKE 1 TABLET BY MOUTH EVERY DAY 90 tablet 3  . SUMAtriptan (IMITREX) 100 MG tablet TAKE 1 TAB AS NEEDED FOR MIGRAINE (REPEAT IN 2 HOURS IF NEEDED). LIMIT TO 2/24 HOUR PERIOD 12 tablet 0  . EPINEPHrine 0.3 mg/0.3 mL IJ SOAJ injection Inject 0.3 mLs (0.3 mg total) into the muscle as needed for anaphylaxis. (bee stings) 2 Device 1  . ketoconazole (NIZORAL) 2 % shampoo Apply 1 application topically 2 (two) times a week. (Patient taking differently: Apply 1 application topically 3 (three) times a week. ) 120 mL 0  . nitroGLYCERIN (NITROSTAT) 0.4 MG SL tablet Place 0.4 mg under the tongue every 5 (five) minutes as needed for chest pain.     No facility-administered medications prior to visit.     Per HPI unless specifically indicated in ROS section below Review of Systems  Constitutional: Negative for activity change, appetite change, chills, fatigue, fever and unexpected weight change.  HENT: Negative for hearing loss.   Eyes: Negative for visual disturbance.  Respiratory: Negative for cough, chest tightness, shortness of breath and wheezing.   Cardiovascular: Positive for leg swelling. Negative for chest pain and palpitations.  Gastrointestinal: Negative for abdominal distention, abdominal pain, blood in stool, constipation, diarrhea (occasional loose stools), nausea and vomiting.  Genitourinary: Negative for difficulty urinating and hematuria.  Musculoskeletal: Positive for myalgias. Negative for arthralgias and neck pain.       Chronic leg pains  Skin: Negative for rash.  Neurological: Negative for dizziness, seizures, syncope and headaches (occasional).  Hematological: Negative for adenopathy. Does not bruise/bleed easily.  Psychiatric/Behavioral: Negative for dysphoric mood. The patient is not  nervous/anxious.    Objective:    BP 120/76 (BP Location: Left Arm, Patient Position: Sitting, Cuff Size: Large)   Pulse 61   Temp 97.9 F (36.6 C) (Temporal)   Ht 5' 9.5" (1.765 m)   Wt 279 lb 5 oz (126.7 kg)   SpO2 95%   BMI 40.66 kg/m   Wt Readings from Last 3 Encounters:  12/14/19 279 lb 5 oz (126.7 kg)  12/11/19 275 lb (124.7 kg)  05/13/19 280 lb 4 oz (127.1 kg)    Physical Exam Vitals and nursing note reviewed.  Constitutional:      General: He is not in acute distress.    Appearance: Normal appearance. He is well-developed. He is not ill-appearing.  HENT:     Head: Normocephalic and atraumatic.     Right Ear: Hearing, tympanic membrane, ear canal and external ear normal.     Left Ear: Hearing, tympanic membrane, ear canal and external ear normal.  Mouth/Throat:     Pharynx: Uvula midline.  Eyes:     General: No scleral icterus.    Extraocular Movements: Extraocular movements intact.     Conjunctiva/sclera: Conjunctivae normal.     Pupils: Pupils are equal, round, and reactive to light.  Cardiovascular:     Rate and Rhythm: Normal rate and regular rhythm.     Pulses: Normal pulses.          Radial pulses are 2+ on the right side and 2+ on the left side.     Heart sounds: Normal heart sounds. No murmur.  Pulmonary:     Effort: Pulmonary effort is normal. No respiratory distress.     Breath sounds: Normal breath sounds. No wheezing, rhonchi or rales.  Abdominal:     General: Abdomen is flat. Bowel sounds are normal. There is no distension.     Palpations: Abdomen is soft. There is no mass.     Tenderness: There is no abdominal tenderness. There is no guarding or rebound.     Hernia: No hernia is present.  Musculoskeletal:        General: Swelling and tenderness present. Normal range of motion.     Cervical back: Normal range of motion and neck supple.     Comments:  S/p L 1st/2nd toe amputations Swollen tender R 3rd MCPJ without redness or warmth    Lymphadenopathy:     Cervical: No cervical adenopathy.  Skin:    General: Skin is warm and dry.     Coloration: Skin is jaundiced.     Findings: No rash.     Comments: Callused lesion L sole at base of 1st MTPJ  Neurological:     General: No focal deficit present.     Mental Status: He is alert and oriented to person, place, and time.     Comments: CN grossly intact, station and gait intact  Psychiatric:        Mood and Affect: Mood normal.        Behavior: Behavior normal.        Thought Content: Thought content normal.        Judgment: Judgment normal.       Results for orders placed or performed in visit on 12/11/19  PSA, Medicare  Result Value Ref Range   PSA 1.04 0.10 - 4.00 ng/ml  Ferritin  Result Value Ref Range   Ferritin 29.9 22.0 - 322.0 ng/mL  IBC panel  Result Value Ref Range   Iron 45 42 - 165 ug/dL   Transferrin 244.0 212.0 - 360.0 mg/dL   Saturation Ratios 13.2 (L) 20.0 - 50.0 %  CBC with Differential/Platelet  Result Value Ref Range   WBC 9.5 4.0 - 10.5 K/uL   RBC 4.59 4.22 - 5.81 Mil/uL   Hemoglobin 14.3 13.0 - 17.0 g/dL   HCT 42.9 39.0 - 52.0 %   MCV 93.6 78.0 - 100.0 fl   MCHC 33.4 30.0 - 36.0 g/dL   RDW 15.6 (H) 11.5 - 15.5 %   Platelets 251.0 150.0 - 400.0 K/uL   Neutrophils Relative % 60.9 43.0 - 77.0 %   Lymphocytes Relative 21.9 12.0 - 46.0 %   Monocytes Relative 13.0 (H) 3.0 - 12.0 %   Eosinophils Relative 3.1 0.0 - 5.0 %   Basophils Relative 1.1 0.0 - 3.0 %   Neutro Abs 5.8 1.4 - 7.7 K/uL   Lymphs Abs 2.1 0.7 - 4.0 K/uL   Monocytes Absolute 1.2 (H) 0.1 -  1.0 K/uL   Eosinophils Absolute 0.3 0.0 - 0.7 K/uL   Basophils Absolute 0.1 0.0 - 0.1 K/uL  TSH  Result Value Ref Range   TSH 5.04 (H) 0.35 - 4.50 uIU/mL  Comprehensive metabolic panel  Result Value Ref Range   Sodium 139 135 - 145 mEq/L   Potassium 4.6 3.5 - 5.1 mEq/L   Chloride 102 96 - 112 mEq/L   CO2 31 19 - 32 mEq/L   Glucose, Bld 121 (H) 70 - 99 mg/dL   BUN 18 6 - 23 mg/dL    Creatinine, Ser 1.23 0.40 - 1.50 mg/dL   Total Bilirubin 0.9 0.2 - 1.2 mg/dL   Alkaline Phosphatase 110 39 - 117 U/L   AST 22 0 - 37 U/L   ALT 18 0 - 53 U/L   Total Protein 6.9 6.0 - 8.3 g/dL   Albumin 3.9 3.5 - 5.2 g/dL   GFR 58.34 (L) >60.00 mL/min   Calcium 9.0 8.4 - 10.5 mg/dL  Lipid panel  Result Value Ref Range   Cholesterol 136 0 - 200 mg/dL   Triglycerides 289.0 (H) 0.0 - 149.0 mg/dL   HDL 36.80 (L) >39.00 mg/dL   VLDL 57.8 (H) 0.0 - 40.0 mg/dL   Total CHOL/HDL Ratio 4    NonHDL 99.05   LDL cholesterol, direct  Result Value Ref Range   Direct LDL 60.0 mg/dL   DG Hand Complete Right CLINICAL DATA:  Third metacarpophalangeal joint swelling and pain for 1 month  EXAM: RIGHT HAND - COMPLETE 3+ VIEW  COMPARISON:  None.  FINDINGS: No acute bony abnormality. Specifically, no fracture, subluxation, or dislocation. Diffuse interphalangeal arthrosis. Minimal second metacarpal phalangeal arthrosis. No significant degenerative change of the third metacarpophalangeal joint. First carpometacarpal and triscaphe arthrosis is present well. No erosive or destructive changes. Mild chondrocalcinosis of the triangular fibrocartilage. No tissue nodularity, gas or foreign body.  IMPRESSION: No acute or suspicious osseous abnormality of the third digit or elsewhere in the hand.  Diffuse mild arthrosis, detailed above.  Electronically Signed   By: Lovena Le M.D.   On: 12/14/2019 19:35   Assessment & Plan:  This visit occurred during the SARS-CoV-2 public health emergency.  Safety protocols were in place, including screening questions prior to the visit, additional usage of staff PPE, and extensive cleaning of exam room while observing appropriate contact time as indicated for disinfecting solutions.   Problem List Items Addressed This Visit    Status post lumbar spine operative procedure for decompression of spinal cord   Status post amputation of great toe, left (HCC)    Spinal stenosis of lumbar region without neurogenic claudication   Right hand pain    Isolated pain and swelling to R 3rd MCP joint with preserved range of motion. In h/o inflammatory arthritis, consider rheumatoid arthritis. Check xrays today, consider further labwork (previous eval 2017 ESR, RF, CCP WNL).       Relevant Orders   DG Hand Complete Right (Completed)   Morbid obesity with BMI of 40.0-44.9, adult (HCC)    Stable period - continue to encourage healthy diet choices.       Iron deficiency    Ferritin low normal. % sat low. No anemia. rec start ferrous sulfate 367m QOD.       Inflammatory arthritis   Hypothyroidism    TSH elevated - patient denies hypothyroid symptoms so no changes at this time.        Health maintenance examination - Primary  Preventative protocols reviewed and updated unless pt declined. Discussed healthy diet and lifestyle.       GERD (gastroesophageal reflux disease)    Chronic, stable. Continue protonix.       Dyslipidemia    Chronic ,stable period on atorvastatin - continue. The ASCVD Risk score Mikey Bussing DC Jr., et al., 2013) failed to calculate for the following reasons:   The patient has a prior MI or stroke diagnosis       Chronic pain syndrome    Continue tylenol, gabapentin, amitriptyline 188m daily.       Callus of foot    Chronic after toe amputation L foot, painful. Will refer to podiatry      Relevant Orders   Ambulatory referral to Podiatry   Advanced care planning/counseling discussion    Will work on this - has packet at home       Other Visit Diagnoses    Left foot pain       Relevant Orders   Ambulatory referral to Podiatry       Meds ordered this encounter  Medications  . ketoconazole (NIZORAL) 2 % shampoo    Sig: Apply 1 application topically 3 (three) times a week.    Dispense:  120 mL    Refill:  3  . EPINEPHrine 0.3 mg/0.3 mL IJ SOAJ injection    Sig: Inject 0.3 mLs (0.3 mg total) into the muscle as  needed for anaphylaxis. (bee stings)    Dispense:  2 each    Refill:  1  . nitroGLYCERIN (NITROSTAT) 0.4 MG SL tablet    Sig: Place 1 tablet (0.4 mg total) under the tongue every 5 (five) minutes as needed for chest pain.    Dispense:  20 tablet    Refill:  3   Orders Placed This Encounter  Procedures  . DG Hand Complete Right    Standing Status:   Future    Number of Occurrences:   1    Standing Expiration Date:   02/12/2021    Order Specific Question:   Reason for Exam (SYMPTOM  OR DIAGNOSIS REQUIRED)    Answer:   R 3rd MCP swelling /pain x 1 month    Order Specific Question:   Preferred imaging location?    Answer:   [facility]   Order Specific Question:   Radiology Contrast Protocol - do NOT remove file path    Answer:   _[email or web address]  . Ambulatory referral to Podiatry    Referral Priority:   Routine    Referral Type:   Consultation    Referral Reason:   Specialty Services Required    Requested Specialty:   Podiatry    Number of Visits Requested:   1    Patient instructions: Work on cMarine scientist Let uKoreaknow if you need a new packet.  Iron was a bit low - start ferrous sulfate 3246m(65FE) iron pill over the counter every other day.  We will watch thyroid levels. Work on healthy diet to lower triglycerides.  Increase water for kidney health.  Back off sodas. Increase water and flavored water.  Good to see you today  Xray today We will refer you to podiatry Return in 6 months for follow up visit.   Follow up plan: No follow-ups on file.  JaRia BushMD

## 2019-12-14 NOTE — Assessment & Plan Note (Signed)
Ferritin low normal. % sat low. No anemia. rec start ferrous sulfate 325mg  QOD.

## 2019-12-14 NOTE — Assessment & Plan Note (Addendum)
Continue tylenol, gabapentin, amitriptyline 100mg  daily.

## 2019-12-14 NOTE — Assessment & Plan Note (Signed)
Chronic ,stable period on atorvastatin - continue. The ASCVD Risk score Mikey Bussing DC Jr., et al., 2013) failed to calculate for the following reasons:   The patient has a prior MI or stroke diagnosis

## 2019-12-14 NOTE — Assessment & Plan Note (Addendum)
Chronic after toe amputation L foot, painful. Will refer to podiatry

## 2019-12-14 NOTE — Assessment & Plan Note (Signed)
Chronic, stable. Continue protonix.

## 2019-12-14 NOTE — Assessment & Plan Note (Addendum)
Isolated pain and swelling to R 3rd MCP joint with preserved range of motion. In h/o inflammatory arthritis, consider rheumatoid arthritis. Check xrays today, consider further labwork (previous eval 2017 ESR, RF, CCP WNL).

## 2019-12-21 ENCOUNTER — Other Ambulatory Visit: Payer: Self-pay | Admitting: Family Medicine

## 2019-12-21 NOTE — Telephone Encounter (Signed)
Electronic refill request Gabapentin Last refill 06/29/19 #360/1 Last office visit 12/14/1919

## 2019-12-28 ENCOUNTER — Telehealth: Payer: Self-pay

## 2019-12-28 NOTE — Telephone Encounter (Signed)
LVM for pt to call for results of x-ray. PCP msg below.  [name], MD  [place], [place] notify xray returned reassuringly without signs of hand fracture. It did show marked wear and tear arthritis changes to finger joints but not specifically at 3rd knuckle of interest. Recommend he try voltaren gel OTC three times daily as needed for discomfort - this should help. Let us know if no improvement or progressive worsening.

## 2020-01-03 ENCOUNTER — Other Ambulatory Visit: Payer: Self-pay | Admitting: Family Medicine

## 2020-01-11 ENCOUNTER — Encounter: Payer: Self-pay | Admitting: Podiatry

## 2020-01-11 ENCOUNTER — Other Ambulatory Visit: Payer: Self-pay

## 2020-01-11 ENCOUNTER — Ambulatory Visit (INDEPENDENT_AMBULATORY_CARE_PROVIDER_SITE_OTHER): Payer: PPO | Admitting: Podiatry

## 2020-01-11 VITALS — BP 126/75 | HR 62 | Temp 97.3°F | Resp 16

## 2020-01-11 DIAGNOSIS — S98132A Complete traumatic amputation of one left lesser toe, initial encounter: Secondary | ICD-10-CM | POA: Diagnosis not present

## 2020-01-11 DIAGNOSIS — M79674 Pain in right toe(s): Secondary | ICD-10-CM | POA: Diagnosis not present

## 2020-01-11 DIAGNOSIS — B351 Tinea unguium: Secondary | ICD-10-CM

## 2020-01-11 DIAGNOSIS — M79675 Pain in left toe(s): Secondary | ICD-10-CM

## 2020-01-11 DIAGNOSIS — L84 Corns and callosities: Secondary | ICD-10-CM | POA: Diagnosis not present

## 2020-01-11 NOTE — Progress Notes (Signed)
   Subjective:    Patient ID: Nicholas [name], male    DOB: 11/11/1950, 69 y.o.   MRN: [id number]  HPI    Review of Systems  All other systems reviewed and are negative.      Objective:   Physical Exam        Assessment & Plan:

## 2020-01-13 NOTE — Progress Notes (Signed)
Subjective:   Patient ID: Nicholas [name], male   DOB: 69 y.o.   MRN: [id number]   HPI Patient presents stating he has had a history of MRSA and lost his hallux and second digit left and states that he gets lesion formations that are painful and also has nails that are thickened and he cannot cut.  He states they become painful and he does have balance issues with loss of his big toe and does not smoke likes to be active if possible   Review of Systems  All other systems reviewed and are negative.       Objective:  Physical Exam Vitals and nursing note reviewed.  Constitutional:      Appearance: He is well-developed.  Pulmonary:     Effort: Pulmonary effort is normal.  Musculoskeletal:        General: Normal range of motion.  Skin:    General: Skin is warm.  Neurological:     Mental Status: He is alert.     Neurovascular status intact muscle strength found to be adequate and subtalar joint range of motion was adequate.  Patient does have amputated left hallux second digit mild gait instability with this with severe keratotic lesion subfirst metatarsal with thick yellow brittle nailbeds 1-5 both feet thickened incurvated in the corners and are sore and difficult for him to cut due to thickness and pain.  Patient has good digital perfusion well oriented x3     Assessment:  Mechanical dysfunction with chronic lesion formation and nail disease with pain associated with the lesions     Plan:  H&P conditions reviewed and I have recommended debridement technique today of both lesions and nails 1 through 5 right 3 through 5 left with no iatrogenic bleeding and then recommended a diabetic shoe with a toe filler in order to try to increase his stability and hopefully prevent him from developing ulceration long-term which she will be prone towards with the lesion formation in the gait issues he has

## 2020-01-28 ENCOUNTER — Other Ambulatory Visit: Payer: Self-pay

## 2020-01-28 ENCOUNTER — Ambulatory Visit: Payer: PPO | Admitting: Orthotics

## 2020-01-28 ENCOUNTER — Other Ambulatory Visit: Payer: Self-pay | Admitting: Family Medicine

## 2020-01-28 DIAGNOSIS — S98132A Complete traumatic amputation of one left lesser toe, initial encounter: Secondary | ICD-10-CM

## 2020-01-28 DIAGNOSIS — L84 Corns and callosities: Secondary | ICD-10-CM

## 2020-01-28 NOTE — Progress Notes (Signed)
Cast today for toe filler L5000 Left 1/2 missising MRSA, non  Diabetic.

## 2020-02-07 ENCOUNTER — Other Ambulatory Visit: Payer: Self-pay | Admitting: Family Medicine

## 2020-02-07 DIAGNOSIS — K269 Duodenal ulcer, unspecified as acute or chronic, without hemorrhage or perforation: Secondary | ICD-10-CM

## 2020-02-07 DIAGNOSIS — K2960 Other gastritis without bleeding: Secondary | ICD-10-CM

## 2020-02-14 ENCOUNTER — Other Ambulatory Visit: Payer: Self-pay | Admitting: Family Medicine

## 2020-02-16 ENCOUNTER — Other Ambulatory Visit: Payer: Self-pay | Admitting: Family Medicine

## 2020-02-18 ENCOUNTER — Other Ambulatory Visit: Payer: PPO | Admitting: Orthotics

## 2020-02-18 ENCOUNTER — Telehealth: Payer: Self-pay | Admitting: Podiatry

## 2020-02-18 NOTE — Telephone Encounter (Signed)
I left message for pt to call yesterday that we did not have the orthotics in and we needed to r/s appt.  Pt returned call 2 times this morning.  I returned call and pt is in agreement that I will call when they come in to schedule pt to pick them up.Marland KitchenMarland Kitchen

## 2020-02-25 ENCOUNTER — Telehealth: Payer: Self-pay | Admitting: Family Medicine

## 2020-02-25 NOTE — Telephone Encounter (Signed)
Pt is aware Dr. Darnell Level is out of office.  He asks that we call when form is completed.  Placed form in Dr. Synthia Innocent box.

## 2020-02-25 NOTE — Telephone Encounter (Signed)
Pt is requesting a handicapped placard

## 2020-02-28 ENCOUNTER — Other Ambulatory Visit: Payer: Self-pay | Admitting: Family Medicine

## 2020-03-01 ENCOUNTER — Ambulatory Visit: Payer: PPO | Admitting: Orthotics

## 2020-03-01 ENCOUNTER — Other Ambulatory Visit: Payer: Self-pay

## 2020-03-01 DIAGNOSIS — S98132A Complete traumatic amputation of one left lesser toe, initial encounter: Secondary | ICD-10-CM | POA: Diagnosis not present

## 2020-03-01 DIAGNOSIS — L84 Corns and callosities: Secondary | ICD-10-CM | POA: Diagnosis not present

## 2020-03-01 NOTE — Progress Notes (Signed)
Patient picked up f/o w toe filler;  Dawn put in charges.

## 2020-03-04 NOTE — Telephone Encounter (Addendum)
Filled and in [place] box.  He will need to fill out the top part.

## 2020-03-04 NOTE — Telephone Encounter (Signed)
Nicholas [name] notified by telephone that his handicap placard application is ready to be picked up at the front desk.

## 2020-03-23 ENCOUNTER — Ambulatory Visit (INDEPENDENT_AMBULATORY_CARE_PROVIDER_SITE_OTHER): Payer: PPO | Admitting: Family Medicine

## 2020-03-23 ENCOUNTER — Encounter: Payer: Self-pay | Admitting: Family Medicine

## 2020-03-23 ENCOUNTER — Other Ambulatory Visit: Payer: Self-pay

## 2020-03-23 VITALS — BP 112/72 | HR 76 | Temp 97.9°F | Ht 69.5 in | Wt 278.6 lb

## 2020-03-23 DIAGNOSIS — M1710 Unilateral primary osteoarthritis, unspecified knee: Secondary | ICD-10-CM | POA: Diagnosis not present

## 2020-03-23 DIAGNOSIS — M79604 Pain in right leg: Secondary | ICD-10-CM | POA: Diagnosis not present

## 2020-03-23 DIAGNOSIS — M1990 Unspecified osteoarthritis, unspecified site: Secondary | ICD-10-CM

## 2020-03-23 DIAGNOSIS — Z89412 Acquired absence of left great toe: Secondary | ICD-10-CM | POA: Diagnosis not present

## 2020-03-23 DIAGNOSIS — R200 Anesthesia of skin: Secondary | ICD-10-CM

## 2020-03-23 DIAGNOSIS — G8929 Other chronic pain: Secondary | ICD-10-CM

## 2020-03-23 DIAGNOSIS — E039 Hypothyroidism, unspecified: Secondary | ICD-10-CM | POA: Diagnosis not present

## 2020-03-23 LAB — CBC WITH DIFFERENTIAL/PLATELET
Basophils Absolute: 0.1 10*3/uL (ref 0.0–0.1)
Basophils Relative: 1 % (ref 0.0–3.0)
Eosinophils Absolute: 0.3 10*3/uL (ref 0.0–0.7)
Eosinophils Relative: 3.8 % (ref 0.0–5.0)
HCT: 44 % (ref 39.0–52.0)
Hemoglobin: 14.9 g/dL (ref 13.0–17.0)
Lymphocytes Relative: 21.9 % (ref 12.0–46.0)
Lymphs Abs: 1.7 10*3/uL (ref 0.7–4.0)
MCHC: 33.9 g/dL (ref 30.0–36.0)
MCV: 93.8 fl (ref 78.0–100.0)
Monocytes Absolute: 1 10*3/uL (ref 0.1–1.0)
Monocytes Relative: 12.5 % — ABNORMAL HIGH (ref 3.0–12.0)
Neutro Abs: 4.7 10*3/uL (ref 1.4–7.7)
Neutrophils Relative %: 60.8 % (ref 43.0–77.0)
Platelets: 212 10*3/uL (ref 150.0–400.0)
RBC: 4.69 Mil/uL (ref 4.22–5.81)
RDW: 14.5 % (ref 11.5–15.5)
WBC: 7.7 10*3/uL (ref 4.0–10.5)

## 2020-03-23 LAB — BASIC METABOLIC PANEL
BUN: 17 mg/dL (ref 6–23)
CO2: 27 mEq/L (ref 19–32)
Calcium: 8.7 mg/dL (ref 8.4–10.5)
Chloride: 103 mEq/L (ref 96–112)
Creatinine, Ser: 1.27 mg/dL (ref 0.40–1.50)
GFR: 56.17 mL/min — ABNORMAL LOW (ref >60.00)
Glucose, Bld: 131 mg/dL — ABNORMAL HIGH (ref 70–99)
Potassium: 4.3 mEq/L (ref 3.5–5.1)
Sodium: 137 mEq/L (ref 135–145)

## 2020-03-23 LAB — T4, FREE: Free T4: 0.87 ng/dL (ref 0.60–1.60)

## 2020-03-23 LAB — VITAMIN B12: Vitamin B-12: 196 pg/mL — ABNORMAL LOW (ref 211–911)

## 2020-03-23 LAB — TSH: TSH: 1.91 u[IU]/mL (ref 0.35–4.50)

## 2020-03-23 MED ORDER — GABAPENTIN 300 MG PO CAPS: ORAL_CAPSULE | ORAL | 1 refills | Status: DC

## 2020-03-23 NOTE — Progress Notes (Signed)
This visit was conducted in person.  BP 112/72 (BP Location: Left Arm, Patient Position: Sitting, Cuff Size: Normal)   Pulse 76   Temp 97.9 F (36.6 C) (Temporal)   Ht 5' 9.5" (1.765 m)   Wt 278 lb 9.6 oz (126.4 kg)   SpO2 94%   BMI 40.55 kg/m    CC: leg pains Subjective:    Patient ID: Nicholas [name], male    DOB: 1951-07-15, 69 y.o.   MRN: [id number]  HPI: Nicholas [name] is a 69 y.o. male presenting on 03/23/2020 for Leg Pain (Bilateral leg pain x1 month )   1 mo h/o bilateral leg pain in known severe arthritis s/p multiple back and knee procedures. Describes discomfort that starts in feet described as foot numbness up to knees, can also get sudden sharp stabbing pain to anterior R knee whenever he stands up. No back pain. Denies inciting trauma/injury or falls. No alcohol use. No weakness noted.   He is taking voltaren with benefit. Also continues his tylenol and gabapentin 300/600/300mg . He takes methocarbamol at night time as well as amitriptyline 100mg  at bedtime.   Latest lumbar laminectomy 11/2018, latest L knee replacement surgery was 01/2017. Latest R knee surgery 11/2008 (antibiotic spacer exchange) and rod steel placement from hip to ankle 03/2009.   Recently saw podiatry with orthotics to feet without benefit.  Upcoming ortho appt Friday (Dr [name].      Relevant past medical, surgical, family and social history reviewed and updated as indicated. Interim medical history since our last visit reviewed. Allergies and medications reviewed and updated. Outpatient Medications Prior to Visit  Medication Sig Dispense Refill  . acetaminophen (TYLENOL) 500 MG tablet Take 1,000 mg by mouth 2 (two) times daily. IN THE AFTERNOON & AT NIGHT    . amitriptyline (ELAVIL) 50 MG tablet TAKE 1-2 TABLETS (50-100 MG TOTAL) BY MOUTH AT BEDTIME. 180 tablet 3  . atorvastatin (LIPITOR) 20 MG tablet TAKE 1 TABLET BY MOUTH EVERY DAY 90 tablet 0  . clotrimazole (LOTRIMIN) 1 % cream Apply 1  application topically 2 (two) times daily. To groin (Patient taking differently: Apply 1 application topically 3 (three) times daily. To groin) 60 g 1  . EPINEPHrine 0.3 mg/0.3 mL IJ SOAJ injection Inject 0.3 mLs (0.3 mg total) into the muscle as needed for anaphylaxis. (bee stings) 2 each 1  . ferrous sulfate 325 (65 FE) MG EC tablet Take 325 mg by mouth every other day.    . furosemide (LASIX) 20 MG tablet TAKE 1 TABLET BY MOUTH EVERY DAY AS NEEDED FOR FLUID OR EDEMA 90 tablet 1  . ketoconazole (NIZORAL) 2 % shampoo Apply 1 application topically 3 (three) times a week. 120 mL 3  . levothyroxine (SYNTHROID) 100 MCG tablet Take 1 tablet (100 mcg total) by mouth daily before breakfast. 90 tablet 3  . methocarbamol (ROBAXIN) 500 MG tablet Take 1 tablet (500 mg total) by mouth every 8 (eight) hours as needed. 30 tablet 0  . metoprolol succinate (TOPROL-XL) 100 MG 24 hr tablet TAKE 1 TABLET BY MOUTH EVERY DAY 90 tablet 3  . pantoprazole (PROTONIX) 40 MG tablet TAKE 1 TABLET BY MOUTH EVERY DAY 90 tablet 3  . SUMAtriptan (IMITREX) 100 MG tablet TAKE 1 TAB AS NEEDED FOR MIGRAINE (REPEAT IN 2 HOURS IF NEEDED). LIMIT TO 2/24 HOUR PERIOD 12 tablet 0  . gabapentin (NEURONTIN) 300 MG capsule TAKE 1 CAPSULE BY MOUTH IN THE MORNING, 2 CAPSULES IN THE AFTERNOON THEN 1  CAPSULE AT NIGHT 360 capsule 1  . nitroGLYCERIN (NITROSTAT) 0.4 MG SL tablet Place 1 tablet (0.4 mg total) under the tongue every 5 (five) minutes as needed for chest pain. (Patient not taking: Reported on 03/23/2020) 20 tablet 3   No facility-administered medications prior to visit.     Per HPI unless specifically indicated in ROS section below Review of Systems Objective:  BP 112/72 (BP Location: Left Arm, Patient Position: Sitting, Cuff Size: Normal)   Pulse 76   Temp 97.9 F (36.6 C) (Temporal)   Ht 5' 9.5" (1.765 m)   Wt 278 lb 9.6 oz (126.4 kg)   SpO2 94%   BMI 40.55 kg/m   Wt Readings from Last 3 Encounters:  03/23/20 278 lb 9.6 oz  (126.4 kg)  12/14/19 279 lb 5 oz (126.7 kg)  12/11/19 275 lb (124.7 kg)      Physical Exam Vitals and nursing note reviewed.  Constitutional:      Appearance: Normal appearance. He is not ill-appearing.     Comments: Painful ambulation  Musculoskeletal:        General: Normal range of motion.     Right lower leg: Edema (tr) present.     Left lower leg: Edema (tr) present.     Comments:  S/p L 1st, 2nd toe amputations for gangrene 1+ DP bilaterally  Chronically fixed R knee in extension due to rod in place Discomfort to palpation anterior R knee and at pes anserine bursa without erythema or warmth  Skin:    General: Skin is warm and dry.     Findings: No erythema or rash.  Neurological:     Mental Status: He is alert.     Sensory: Sensory deficit present.     Motor: Motor function is intact.     Coordination: Coordination is intact.     Comments: Diminished sensation to light touch at bilateral feet  Psychiatric:        Mood and Affect: Mood normal.        Behavior: Behavior normal.       Results for orders placed or performed in visit on 03/23/20  Vitamin B12  Result Value Ref Range   Vitamin B-12 196 (L) 211 - 911 pg/mL  TSH  Result Value Ref Range   TSH 1.91 0.35 - 4.50 uIU/mL  T4, free  Result Value Ref Range   Free T4 0.87 0.60 - 1.60 ng/dL  Basic metabolic panel  Result Value Ref Range   Sodium 137 135 - 145 mEq/L   Potassium 4.3 3.5 - 5.1 mEq/L   Chloride 103 96 - 112 mEq/L   CO2 27 19 - 32 mEq/L   Glucose, Bld 131 (H) 70 - 99 mg/dL   BUN 17 6 - 23 mg/dL   Creatinine, Ser 1.27 0.40 - 1.50 mg/dL   GFR 56.17 (L) >60.00 mL/min   Calcium 8.7 8.4 - 10.5 mg/dL  CBC with Differential/Platelet  Result Value Ref Range   WBC 7.7 4.0 - 10.5 K/uL   RBC 4.69 4.22 - 5.81 Mil/uL   Hemoglobin 14.9 13.0 - 17.0 g/dL   HCT 44.0 39 - 52 %   MCV 93.8 78.0 - 100.0 fl   MCHC 33.9 30.0 - 36.0 g/dL   RDW 14.5 11.5 - 15.5 %   Platelets 212.0 150 - 400 K/uL   Neutrophils  Relative % 60.8 43 - 77 %   Lymphocytes Relative 21.9 12 - 46 %   Monocytes Relative 12.5 (H) 3 -  12 %   Eosinophils Relative 3.8 0 - 5 %   Basophils Relative 1.0 0 - 3 %   Neutro Abs 4.7 1.4 - 7.7 K/uL   Lymphs Abs 1.7 0.7 - 4.0 K/uL   Monocytes Absolute 1.0 0 - 1 K/uL   Eosinophils Absolute 0.3 0 - 0 K/uL   Basophils Absolute 0.1 0 - 0 K/uL   Assessment & Plan:  This visit occurred during the SARS-CoV-2 public health emergency.  Safety protocols were in place, including screening questions prior to the visit, additional usage of staff PPE, and extensive cleaning of exam room while observing appropriate contact time as indicated for disinfecting solutions.   Problem List Items Addressed This Visit    Status post amputation of great toe, left (HCC)   Numbness of foot - Primary    New - no h/o diabetes, no alcohol use.  Check for b12 deficiency.       Relevant Orders   Vitamin B12 (Completed)   Basic metabolic panel (Completed)   CBC with Differential/Platelet (Completed)   Inflammatory arthritis   Hypothyroidism    Update labs on levothyroxine 174mcg replacement daily.       Relevant Orders   TSH (Completed)   T4, free (Completed)   Chronic leg pain    Worsening pain at R knee associated with numbness in feet radiating to calves. Check labs. Discussed narcotic therapy - he states codeine has been ineffective in the past. Will start with increasing gabapentin dose to 300/600/600mg  daily, continue amitriptyline. Has ortho f/u later this week.  Has not had arterial circulation evaluation previously - consider ABIs if unrevealing labs although pulses present bilaterally.      Relevant Medications   gabapentin (NEURONTIN) 300 MG capsule   Arthritis of knee    Severe s/p L knee replacement, and multiple R knee surgeries ultimately with rod placed to entire leg. Has ortho f/u later this week.           Meds ordered this encounter  Medications  . gabapentin (NEURONTIN) 300 MG  capsule    Sig: Take 1 capsule (300 mg total) by mouth in the morning AND 2 capsules (600 mg total) 2 (two) times daily. (lunch and dinner).    Dispense:  450 capsule    Refill:  1    Note new sig   Orders Placed This Encounter  Procedures  . Vitamin B12  . TSH  . T4, free  . Basic metabolic panel  . CBC with Differential/Platelet    Patient Instructions  Keep ortho appointment this week.  Increase gabapentin to 1 in am, 2 at lunch and 2 at night.  Labs today.    Follow up plan: No follow-ups on file.  [name], MD

## 2020-03-23 NOTE — Patient Instructions (Addendum)
Keep ortho appointment this week.  Increase gabapentin to 1 in am, 2 at lunch and 2 at night.  Labs today.

## 2020-03-25 ENCOUNTER — Ambulatory Visit: Payer: Self-pay

## 2020-03-25 ENCOUNTER — Telehealth: Payer: Self-pay

## 2020-03-25 ENCOUNTER — Ambulatory Visit: Payer: PPO | Admitting: Surgical

## 2020-03-25 DIAGNOSIS — M79604 Pain in right leg: Secondary | ICD-10-CM

## 2020-03-25 DIAGNOSIS — M25561 Pain in right knee: Secondary | ICD-10-CM

## 2020-03-25 DIAGNOSIS — R200 Anesthesia of skin: Secondary | ICD-10-CM | POA: Insufficient documentation

## 2020-03-25 DIAGNOSIS — R202 Paresthesia of skin: Secondary | ICD-10-CM

## 2020-03-25 DIAGNOSIS — E559 Vitamin D deficiency, unspecified: Secondary | ICD-10-CM | POA: Diagnosis not present

## 2020-03-25 NOTE — Assessment & Plan Note (Signed)
Severe s/p L knee replacement, and multiple R knee surgeries ultimately with rod placed to entire leg. Has ortho f/u later this week.

## 2020-03-25 NOTE — Assessment & Plan Note (Addendum)
Update labs on levothyroxine 1101mcg replacement daily.

## 2020-03-25 NOTE — Assessment & Plan Note (Signed)
New - no h/o diabetes, no alcohol use.  Check for b12 deficiency.

## 2020-03-25 NOTE — Assessment & Plan Note (Addendum)
Worsening pain at R knee associated with numbness in feet radiating to calves. Check labs. Discussed narcotic therapy - he states codeine has been ineffective in the past. Will start with increasing gabapentin dose to 300/600/600mg  daily, continue amitriptyline. Has ortho f/u later this week.  Has not had arterial circulation evaluation previously - consider ABIs if unrevealing labs although pulses present bilaterally.

## 2020-03-25 NOTE — Telephone Encounter (Signed)
-----   Message from [name], MD sent at 03/25/2020  6:41 AM EDT ----- Plz notify kidneys stable. Thyroid levels normal, blood counts normal.  Vitamin B12 was low - this could account for some of his numbness symptoms. Recommend he start 1053mcg OTC vit B12 daily (dissolvable tablets if he can find) and/or would offer coming into office for monthly B12 shots for 6 months then reassessing control.  Let me know how ortho appt goes, we may discuss adding narcotic if uncontrolled pain.

## 2020-03-25 NOTE — Telephone Encounter (Signed)
Patient aware of results and recommendations. °

## 2020-03-26 ENCOUNTER — Encounter: Payer: Self-pay | Admitting: Surgical

## 2020-03-26 LAB — VITAMIN D 25 HYDROXY (VIT D DEFICIENCY, FRACTURES): Vit D, 25-Hydroxy: 13 ng/mL — ABNORMAL LOW (ref 30–100)

## 2020-03-26 NOTE — Progress Notes (Signed)
Office Visit Note   Patient: Nicholas [name]           Date of Birth: May 25, 1951           MRN: [id number] Visit Date: 03/25/2020 Requested by: [name], MD [place],  [place] [address] PCP: [name], MD  Subjective: Chief Complaint  Patient presents with  . Left Leg - Pain, Numbness  . Right Leg - Pain, Numbness    HPI: Nicholas [name] is a 69 y.o. male who presents to the office complaining of bilateral leg pain and numbness/tingling.  Patient notes that he has been noticing increased primarily right leg pain around his right knee over the last month.  He denies any injury.  He notes that his leg feels subjectively weak.  He has history of right knee fusion and cannot flex/extend his right knee.  Pain is worse with prolonged standing or walking.  He denies any groin pain, low back pain, buttocks pain.  Additionally, patient complains of numbness that starts in his feet and travels up to his knees.  Involves his entire foot.  This has been going on about a month also.  He denies any numbness that feels like it comes from his back.  He denies any radicular pain that seems like comes from his back.  He has been checked for vitamin B12 by his primary care physician and has found to be deficient.  He denies any history of celiac disease, reflux symptoms, alcoholism..                ROS:  ' All systems reviewed are negative as they relate to the chief complaint within the history of present illness.  Patient denies fevers or chills.  Assessment & Plan: Visit Diagnoses:  1. Numbness and tingling of both feet   2. Pain in right leg   3. Tenderness of right knee   4. Vitamin D deficiency     Plan: Patient is a 69 year old male who presents complaining of bilateral leg pain and numbness/tingling.  Symptoms have been ongoing over the last month.  He denies any low back pain though he does have a history of L3-L4 and L4-L5 decompression early last year.  However  his leg pain does not seem to be coming from his back and he has tenderness throughout the legs bilaterally, more so over the right leg around the tibial shaft and the knees.  Radiographs taken of the lumbar spine today reveal no change.  With prior radiographs though he does have significant degeneration throughout the lumbar spine.  Right tib-fib x-rays taken today appears identical to prior tib-fib radiograph from several years ago with no lucency surrounding the rod or any fractures identified.  He has no nerve root tension signs on exam.  He has excellent strength.  No back pain is elicited throughout the exam.  Impression is that numbness and tingling is not coming from his back and neither is the leg pain.  Patient was fairly deficient in his B12 level that was checked by Dr. Danise Mina.  Recommended patient follow-up with Dr. Danise Mina and pursue B12 supplementation to see if this improves his symptoms.  If not, recommend that he return for reevaluation.  Additionally, ordered vitamin D to test if patient is vitamin D deficient which could cause stress reaction throughout his lower legs giving him this diffuse tenderness and pain that is evident today on exam.  Patient agreed with this plan.  Follow-up  in 4 weeks.  Follow-Up Instructions: No follow-ups on file.   Orders:  Orders Placed This Encounter  Procedures  . XR Lumbar Spine 2-3 Views  . XR Tibia/Fibula Right  . Vitamin D (25 hydroxy)   No orders of the defined types were placed in this encounter.     Procedures: No procedures performed   Clinical Data: No additional findings.  Objective: Vital Signs: There were no vitals taken for this visit.  Physical Exam:  Constitutional: Patient appears well-developed HEENT:  Head: Normocephalic Eyes:EOM are normal Neck: Normal range of motion Cardiovascular: Normal rate Pulmonary/chest: Effort normal Neurologic: Patient is alert Skin: Skin is warm Psychiatric: Patient has normal  mood and affect  Ortho Exam:  No significant tenderness to palpation throughout the axial lumbar spine.  No tenderness to palpation throughout the paraspinal musculature.  Negative straight leg raise bilaterally.  Sensation intact through all dermatomes of the bilateral lower extremities aside from the dorsal and plantar aspects of his bilateral feet which have sensation but are reduced.  He has tenderness to palpation throughout the right knee and tibial shaft diffusely and throughout the left tibial shaft diffusely to a lesser extent.  5/5 motor strength of the bilateral lower extremity muscle groups including hip flexors, quadriceps, hamstring, dorsiflexion, plantarflexion.  Able to weight-bear.  Specialty Comments:  No specialty comments available.  Imaging: No results found.   PMFS History: Patient Active Problem List   Diagnosis Date Noted  . Numbness of foot 03/25/2020  . Right hand pain 12/14/2019  . Status post amputation of great toe, left ([place]) 12/14/2019  . Callus of foot 04/02/2019  . Allergy to bee sting 03/11/2019  . Insomnia 02/10/2019  . Status post lumbar spine operative procedure for decompression of spinal cord 12/12/2018  . Dyslipidemia 10/19/2018  . Foot deformity 10/19/2018  . Spinal stenosis of lumbar region without neurogenic claudication 10/01/2018  . Facial rash 05/26/2018  . Status post reverse total replacement of right shoulder 05/13/2018  . Epigastric pain 02/26/2018  . CHF (congestive heart failure) ([place]) 12/27/2017  . OSA (obstructive sleep apnea) 12/27/2017  . Health maintenance examination 11/25/2017  . Renal insufficiency 10/01/2017  . Fatty liver 10/01/2017  . Status post reverse total replacement of left shoulder 09/03/2017  . Bilateral shoulder pain 05/24/2017  . History of CVA in adulthood 03/08/2017  . S/P total knee arthroplasty, left 01/30/2017  . Arthritis of knee 01/15/2017  . Skin rash 12/10/2016  . Medicare annual wellness visit,  initial 10/05/2016  . Advanced care planning/counseling discussion 10/05/2016  . Morbid obesity with BMI of 40.0-44.9, adult ([facility]) 07/19/2016  . Hypothyroidism   . Iron deficiency 04/23/2016  . Chronic pain syndrome 04/23/2016  . Inflammatory arthritis 04/23/2016  . Chronic leg pain 04/23/2016  . Lesion of palate 04/23/2016  . Grieving 04/23/2016  . GERD (gastroesophageal reflux disease)   . Duodenitis determined by biopsy 02/13/2016  . Diverticulosis of colon without hemorrhage 01/31/2016  . Chest pain 01/11/2012  . Migraine 01/11/2012  . History of methicillin resistant staphylococcus aureus (MRSA) 07/04/2010  . DEGENERATIVE DISC DISEASE 07/04/2010  . History of total right knee replacement 07/04/2010  . Primary osteoarthritis, right shoulder 06/09/2007  . Personal history of colonic adenoma 06/01/2003   Past Medical History:  Diagnosis Date  . Acute kidney failure 08/2008   "cleared up"  no problems since  . Allergy   . Anxiety   . Arthritis   . Asthma    ?of this no inhaler  .  Blood transfusion   . CHF (congestive heart failure) ([place])    ?of this, pt denies  . CLOSTRIDIUM DIFFICILE COLITIS 07/04/2010   Annotation: 12/09, 2/10 Qualifier: Diagnosis of  By: [name], [name]    . Depression with anxiety   . Duodenitis determined by biopsy 02/2016   peptic likely due to aleve (erosive gastropathy with duodenal erosions)  . ECZEMA 07/04/2010   Qualifier: Diagnosis of  By: [name], [name]    . Elevated liver enzymes   . Full dentures   . GERD (gastroesophageal reflux disease)   . Heart attack ([place])    08/2008 (likely demand ischemia in the setting of MSSA sepsis/R TKA  infection)10-2008  . History of hiatal hernia   . History of stomach ulcers   . Hypothyroidism   . Lower GI bleeding   . MALAR AND MAXILLARY BONES CLOSED FRACTURE 07/04/2010   Annotation: ORIF Qualifier: Diagnosis of  By: [name], [name]    . Migraine    "definitely"  . MRSA (methicillin resistant  Staphylococcus aureus)    in leg, had to place steel rod in leg  . Personal history of colonic adenoma 06/01/2003  . PFO (patent foramen ovale)    small PFO by 08/2008 TEE  . Pneumonia 08/2008   "while in ICU"  . Seizures ([place]) 2009   "long time ago"    . Sleep apnea    does not wear CPAP  . Stroke [employer]) 2010   unable to complete sentences at times  . Wears glasses     Family History  Problem Relation Age of Onset  . Coronary artery disease Father 16  . Stroke Father   . CAD Mother 75       stents  . Breast cancer Sister   . Diabetes Brother   . Diabetes Sister   . Colon cancer Neg Hx   . Stomach cancer Neg Hx     Past Surgical History:  Procedure Laterality Date  . anterior  nerve transposition  07/2009   left ulnar nerve  . antibiotic spacer exchange  11/2008; 08/2006   right knee  . ARTHROTOMY  08/2008   right knee w/I&D  . CARDIOVASCULAR STRESS TEST  2013   stress EKG - negative for ischemia, 4 min 7.3 METs, normal blood pressure response  . CATARACT EXTRACTION    . COLONOSCOPY  11/2012   diverticulosis, rpt 10 yrs [name])  . ESOPHAGOGASTRODUODENOSCOPY  02/2016   erosive gastropathy with duodenal erosions [name])  . HARDWARE REMOVAL  04/2006   right knee w/antibiotic spacers placed  . INGUINAL HERNIA REPAIR  early 1990's   bilateral  . JOINT REPLACEMENT Bilateral   . KNEE ARTHROSCOPY  10/2001   right  . KNEE FUSION  03/2009   right knee removal; antibiotic spacers;   . LEFT HEART CATH AND CORONARY ANGIOGRAPHY N/A 02/04/2018    WNL (Patwardhan, Reynold Bowen, MD)  . LUMBAR LAMINECTOMY/DECOMPRESSION MICRODISCECTOMY N/A 12/05/2018   Procedure: LUMBAR THREE TO FOUR AND LUMBAR FOUR TO FIVE DECOMPRESSION;  Surgeon: [name], MD;  Location: [facility];  Service: Orthopedics;  Laterality: N/A;  . MULTIPLE TOOTH EXTRACTIONS    . REPLACEMENT TOTAL KNEE  08/2008; 09/2001   right  . REVERSE SHOULDER ARTHROPLASTY Left 09/03/2017   Procedure: REVERSE SHOULDER ARTHROPLASTY;   Surgeon: Meredith Pel, MD;  Location: [facility];  Service: Orthopedics;  Laterality: Left;  . REVERSE SHOULDER ARTHROPLASTY Right 05/13/2018   Procedure: RIGHT REVERSE SHOULDER ARTHROPLASTY;  Surgeon: [name] Sa,  Tonna Corner, MD;  Location: [facility];  Service: Orthopedics;  Laterality: Right;  . rod placement Right 03/2009   R knee  . SYNOVECTOMY  06/2005   debridement, liner exchange right knee  . TOE AMPUTATION Left 08/2008   great toe - osteomyelitis (staph infection)  . TOE AMPUTATION Left 04/2016   2nd toe [name]  . TONSILLECTOMY    . TOTAL KNEE ARTHROPLASTY Left 01/15/2017  . TOTAL KNEE ARTHROPLASTY Left 01/15/2017   Procedure: TOTAL KNEE ARTHROPLASTY;  Surgeon: Meredith Pel, MD;  Location: [facility];  Service: Orthopedics;  Laterality: Left;   Social History   Occupational History  . Occupation: retired  Tobacco Use  . Smoking status: Never Smoker  . Smokeless tobacco: Never Used  Vaping Use  . Vaping Use: Never used  Substance and Sexual Activity  . Alcohol use: No    Alcohol/week: 0.0 standard drinks    Comment: "I abused alcohol; last drink  ~ 2005"  . Drug use: No  . Sexual activity: Never

## 2020-03-28 ENCOUNTER — Other Ambulatory Visit: Payer: Self-pay | Admitting: Surgical

## 2020-03-28 ENCOUNTER — Telehealth: Payer: Self-pay | Admitting: *Deleted

## 2020-03-28 ENCOUNTER — Telehealth: Payer: Self-pay

## 2020-03-28 DIAGNOSIS — E559 Vitamin D deficiency, unspecified: Secondary | ICD-10-CM

## 2020-03-28 DIAGNOSIS — E538 Deficiency of other specified B group vitamins: Secondary | ICD-10-CM

## 2020-03-28 MED ORDER — VITAMIN D (ERGOCALCIFEROL) 1.25 MG (50000 UNIT) PO CAPS
50000.0000 [IU] | ORAL_CAPSULE | ORAL | 0 refills | Status: DC
Start: 2020-03-28 — End: 2020-04-25

## 2020-03-28 NOTE — Telephone Encounter (Signed)
Pls advise.  

## 2020-03-28 NOTE — Telephone Encounter (Signed)
submitted

## 2020-03-28 NOTE — Telephone Encounter (Signed)
FYI Patient called stating that he would like to get his lab results that were done at the orthopedist office Friday. Advised patient that he should call the orthopedist office and get the results from that office. Advised patient that the doctor there would review the results and make recommendations.[name]

## 2020-03-28 NOTE — Telephone Encounter (Signed)
Patient calling in for results on blood work.

## 2020-03-29 NOTE — Telephone Encounter (Signed)
I called him back and talked with him, I just autopiloted and said submitted, meant it was taken care of

## 2020-03-29 NOTE — Telephone Encounter (Signed)
Not sure what this means? Patient wanting lab results.

## 2020-03-31 ENCOUNTER — Other Ambulatory Visit: Payer: Self-pay | Admitting: Family Medicine

## 2020-03-31 DIAGNOSIS — E538 Deficiency of other specified B group vitamins: Secondary | ICD-10-CM | POA: Insufficient documentation

## 2020-03-31 DIAGNOSIS — E559 Vitamin D deficiency, unspecified: Secondary | ICD-10-CM | POA: Insufficient documentation

## 2020-03-31 NOTE — Telephone Encounter (Addendum)
Please call - how is leg doing?  Again would see if he'd be willing to come in for b12 shots which is what will best improve levels. If agrees, would recommend weekly shots for 2 weeks then monthly for 6 months.

## 2020-03-31 NOTE — Telephone Encounter (Signed)
Spoke with pt asking about his leg.  States there is no change.  I relayed Dr. Synthia Innocent message.  Pt verbalizes understanding.  Scheduled vit B12 inj on 04/05/20 at 9:15 and 04/14/20 at 2:30.  Says he will schedule 6 monthly shots at the 7/1 NV.  FYI to Dr. Darnell Level.

## 2020-04-04 DIAGNOSIS — H52203 Unspecified astigmatism, bilateral: Secondary | ICD-10-CM | POA: Diagnosis not present

## 2020-04-04 DIAGNOSIS — Z961 Presence of intraocular lens: Secondary | ICD-10-CM | POA: Diagnosis not present

## 2020-04-04 DIAGNOSIS — H524 Presbyopia: Secondary | ICD-10-CM | POA: Diagnosis not present

## 2020-04-05 ENCOUNTER — Ambulatory Visit (INDEPENDENT_AMBULATORY_CARE_PROVIDER_SITE_OTHER): Payer: PPO | Admitting: *Deleted

## 2020-04-05 ENCOUNTER — Other Ambulatory Visit: Payer: Self-pay

## 2020-04-05 DIAGNOSIS — E538 Deficiency of other specified B group vitamins: Secondary | ICD-10-CM | POA: Diagnosis not present

## 2020-04-05 MED ORDER — CYANOCOBALAMIN 1000 MCG/ML IJ SOLN
1000.0000 ug | Freq: Once | INTRAMUSCULAR | Status: AC
  Administered 2020-04-05: 1000 ug via INTRAMUSCULAR

## 2020-04-05 NOTE — Progress Notes (Signed)
Per orders of Dr. Danise Mina, injection of B12 given by Tammi Sou. Patient tolerated injection well.  Pt held for 10 min due to this being his 1st B12 inj. No adverse reactions, pt left in stable condition.

## 2020-04-13 ENCOUNTER — Encounter: Payer: Self-pay | Admitting: Podiatry

## 2020-04-13 ENCOUNTER — Ambulatory Visit: Payer: PPO | Admitting: Podiatry

## 2020-04-13 ENCOUNTER — Other Ambulatory Visit: Payer: Self-pay

## 2020-04-13 DIAGNOSIS — B351 Tinea unguium: Secondary | ICD-10-CM

## 2020-04-13 DIAGNOSIS — M79675 Pain in left toe(s): Secondary | ICD-10-CM

## 2020-04-13 DIAGNOSIS — M79674 Pain in right toe(s): Secondary | ICD-10-CM | POA: Diagnosis not present

## 2020-04-13 DIAGNOSIS — S98132A Complete traumatic amputation of one left lesser toe, initial encounter: Secondary | ICD-10-CM

## 2020-04-13 NOTE — Progress Notes (Signed)
This patient returns to my office for at risk foot care.  This patient requires this care by a professional since this patient will be at risk due to having amputation left great toe left foot and neuropathy and renal insufficiency..  This patient is unable to cut nails himself since the patient cannot reach his nails.These nails are painful walking and wearing shoes.  This patient presents for at risk foot care today.  General Appearance  Alert, conversant and in no acute stress.  Vascular  Dorsalis pedis and posterior tibial  pulses are weakly  palpable  bilaterally.  Capillary return is within normal limits  bilaterally. Temperature is within normal limits  bilaterally.  Neurologic  Senn-Weinstein monofilament wire test diminished   bilaterally. Muscle power within normal limits bilaterally.  Nails Thick disfigured discolored nails with subungual debris  from hallux to fifth toes right and 3-5 left foot.. No evidence of bacterial infection or drainage bilaterally.  Orthopedic  No limitations of motion  feet .  No crepitus or effusions noted.  No bony pathology or digital deformities noted. Amputation 1,2 left foot.  Skin  normotropic skin with no porokeratosis noted bilaterally.  No signs of infections or ulcers noted.     Onychomycosis  Pain in right toes  Pain in left toes  Consent was obtained for treatment procedures.   Mechanical debridement of nails 1-5  bilaterally performed with a nail nipper.  Filed with dremel without incident.    Return office visit   10 weeks                   Told patient to return for periodic foot care and evaluation due to potential at risk complications.   Gardiner Barefoot DPM

## 2020-04-14 ENCOUNTER — Ambulatory Visit (INDEPENDENT_AMBULATORY_CARE_PROVIDER_SITE_OTHER): Payer: PPO | Admitting: *Deleted

## 2020-04-14 DIAGNOSIS — E538 Deficiency of other specified B group vitamins: Secondary | ICD-10-CM | POA: Diagnosis not present

## 2020-04-14 MED ORDER — CYANOCOBALAMIN 1000 MCG/ML IJ SOLN
1000.0000 ug | Freq: Once | INTRAMUSCULAR | Status: AC
  Administered 2020-04-14: 1000 ug via INTRAMUSCULAR

## 2020-04-14 NOTE — Progress Notes (Signed)
Per orders of Dr. Danise Mina, injection of B12 1000 mcg/mL given by Aulden Calise. Patient tolerated injection well.

## 2020-04-22 ENCOUNTER — Ambulatory Visit: Payer: PPO | Admitting: Orthopedic Surgery

## 2020-04-22 DIAGNOSIS — M79604 Pain in right leg: Secondary | ICD-10-CM

## 2020-04-22 DIAGNOSIS — R200 Anesthesia of skin: Secondary | ICD-10-CM

## 2020-04-22 DIAGNOSIS — R202 Paresthesia of skin: Secondary | ICD-10-CM | POA: Diagnosis not present

## 2020-04-23 ENCOUNTER — Encounter: Payer: Self-pay | Admitting: Orthopedic Surgery

## 2020-04-23 NOTE — Progress Notes (Signed)
Office Visit Note   Patient: Nicholas [name]           Date of Birth: 1950/10/29           MRN: [id number] Visit Date: 04/22/2020 Requested by: [name], MD [place],  [place] [id number] PCP: [name], MD  Subjective: Chief Complaint  Patient presents with   Follow-up    HPI: Nicholas [name] is a patient with bilateral leg pain with numbness and tingling.  Since he was last seen he has had treatment for low vitamin B12 as well as low vitamin D.  His vitamin D level was 13.  He did have an MRI scan in 2019 which showed severe left foraminal encroachment and he did have surgery for that.  He also had some spinal stenosis.  He describes bilateral foot numbness with walking.  Symptoms are definitely worse with activity.  It has been 2 years since his lumbar spine surgery.  Denies any bowel and bladder symptoms or fevers and chills.              ROS: All systems reviewed are negative as they relate to the chief complaint within the history of present illness.  Patient denies  fevers or chills.   Assessment & Plan: Visit Diagnoses:  1. Pain in right leg   2. Numbness and tingling of both feet     Plan: Impression is bilateral leg pain with numbness and tingling which likely indicates progression of foraminal stenosis and spinal stenosis.  This is bad enough for [name] that it is affecting his ADLs and quality of life.  No evidence of infection with absence of systemic symptoms.  He needs MRI lumbar spine to evaluate for recurrent spinal stenosis with possible injections versus surgery to follow.  No discrete weakness today but he does have significant pain and limitation of walking endurance.  Follow-Up Instructions: Return for after MRI.   Orders:  Orders Placed This Encounter  Procedures   MR Lumbar Spine w/o contrast   No orders of the defined types were placed in this encounter.     Procedures: No procedures performed   Clinical Data: No  additional findings.  Objective: Vital Signs: There were no vitals taken for this visit.  Physical Exam:   Constitutional: Patient appears well-developed HEENT:  Head: Normocephalic Eyes:EOM are normal Neck: Normal range of motion Cardiovascular: Normal rate Pulmonary/chest: Effort normal Neurologic: Patient is alert Skin: Skin is warm Psychiatric: Patient has normal mood and affect    Ortho Exam: Ortho exam demonstrates fused right knee.  There is some occasional sharp pain that he describes in the knee joint but radiographically that area looks fused without evidence of recurrent infection.  No groin pain on the right with internal X rotation of the leg and circumduction of the right leg.  No nerve root tension signs on the left or right hand side.  Ankle dorsiflexion plantarflexion is intact.  No other masses lymphadenopathy or skin changes noted in that right knee or left knee region.  Pedal pulses palpable.  Does have some pain with forward lateral bending but no trochanteric tenderness.  Specialty Comments:  No specialty comments available.  Imaging: No results found.   PMFS History: Patient Active Problem List   Diagnosis Date Noted   Vitamin B12 deficiency 03/31/2020   Vitamin D deficiency 03/31/2020   Numbness of foot 03/25/2020   Right hand pain 12/14/2019   Status post amputation of great toe, left (  [place]) 12/14/2019   Callus of foot 04/02/2019   Allergy to bee sting 03/11/2019   Insomnia 02/10/2019   Status post lumbar spine operative procedure for decompression of spinal cord 12/12/2018   Dyslipidemia 10/19/2018   Foot deformity 10/19/2018   Spinal stenosis of lumbar region without neurogenic claudication 10/01/2018   Facial rash 05/26/2018   Status post reverse total replacement of right shoulder 05/13/2018   Epigastric pain 02/26/2018   CHF (congestive heart failure) ([place]) 12/27/2017   OSA (obstructive sleep apnea) 12/27/2017   Health  maintenance examination 11/25/2017   Renal insufficiency 10/01/2017   Fatty liver 10/01/2017   Status post reverse total replacement of left shoulder 09/03/2017   Bilateral shoulder pain 05/24/2017   History of CVA in adulthood 03/08/2017   S/P total knee arthroplasty, left 01/30/2017   Arthritis of knee 01/15/2017   Skin rash 12/10/2016   Medicare annual wellness visit, initial 10/05/2016   Advanced care planning/counseling discussion 10/05/2016   Morbid obesity with BMI of 40.0-44.9, adult ([place]) 07/19/2016   Hypothyroidism    Iron deficiency 04/23/2016   Chronic pain syndrome 04/23/2016   Inflammatory arthritis 04/23/2016   Chronic leg pain 04/23/2016   Lesion of palate 04/23/2016   Grieving 04/23/2016   GERD (gastroesophageal reflux disease)    Duodenitis determined by biopsy 02/13/2016   Diverticulosis of colon without hemorrhage 01/31/2016   Chest pain 01/11/2012   Migraine 01/11/2012   History of methicillin resistant staphylococcus aureus (MRSA) 07/04/2010   DEGENERATIVE [place] DISEASE 07/04/2010   History of total right knee replacement 07/04/2010   Primary osteoarthritis, right shoulder 06/09/2007   Personal history of colonic adenoma 06/01/2003   Past Medical History:  Diagnosis Date   Acute kidney failure 08/2008   "cleared up"  no problems since   Allergy    Anxiety    Arthritis    Asthma    ?of this no inhaler   Blood transfusion    CHF (congestive heart failure) ([place])    ?of this, pt denies   CLOSTRIDIUM DIFFICILE COLITIS 07/04/2010   Annotation: 12/09, 2/10 Qualifier: Diagnosis of  By: [name], [name]     Depression with anxiety    Duodenitis determined by biopsy 02/2016   peptic likely due to aleve (erosive gastropathy with duodenal erosions)   ECZEMA 07/04/2010   Qualifier: Diagnosis of  By: [name], [name]     Elevated liver enzymes    Full dentures    GERD (gastroesophageal reflux disease)    Heart attack  ([place])    08/2008 (likely demand ischemia in the setting of MSSA sepsis/R TKA  infection)10-2008   History of hiatal hernia    History of stomach ulcers    Hypothyroidism    Lower GI bleeding    MALAR AND MAXILLARY BONES CLOSED FRACTURE 07/04/2010   Annotation: ORIF Qualifier: Diagnosis of  By: [name], [name]     Migraine    "definitely"   MRSA (methicillin resistant Staphylococcus aureus)    in leg, had to place steel rod in leg   Personal history of colonic adenoma 06/01/2003   PFO (patent foramen ovale)    small PFO by 08/2008 TEE   Pneumonia 08/2008   "while in ICU"   Seizures ([place]) 2009   "long time ago"     Sleep apnea    does not wear CPAP   Stroke ([place]) 2010   unable to complete sentences at times   Wears glasses     Family History  Problem  Relation Age of Onset   Coronary artery disease Father 28   Stroke Father    CAD Mother 79       stents   Breast cancer Sister    Diabetes Brother    Diabetes Sister    Colon cancer Neg Hx    Stomach cancer Neg Hx     Past Surgical History:  Procedure Laterality Date   anterior  nerve transposition  07/2009   left ulnar nerve   antibiotic spacer exchange  11/2008; 08/2006   right knee   ARTHROTOMY  08/2008   right knee w/I&D   CARDIOVASCULAR STRESS TEST  2013   stress EKG - negative for ischemia, 4 min 7.3 METs, normal blood pressure response   CATARACT EXTRACTION     COLONOSCOPY  11/2012   diverticulosis, rpt 10 yrs [name])   ESOPHAGOGASTRODUODENOSCOPY  02/2016   erosive gastropathy with duodenal erosions [name])   HARDWARE REMOVAL  04/2006   right knee w/antibiotic spacers placed   INGUINAL HERNIA REPAIR  early 1990's   bilateral   JOINT REPLACEMENT Bilateral    KNEE ARTHROSCOPY  10/2001   right   KNEE FUSION  03/2009   right knee removal; antibiotic spacers;    LEFT HEART CATH AND CORONARY ANGIOGRAPHY N/A 02/04/2018    WNL (Patwardhan, Reynold Bowen, MD)   LUMBAR  LAMINECTOMY/DECOMPRESSION MICRODISCECTOMY N/A 12/05/2018   Procedure: LUMBAR THREE TO FOUR AND LUMBAR FOUR TO FIVE DECOMPRESSION;  Surgeon: [name], MD;  Location: [facility];  Service: Orthopedics;  Laterality: N/A;   MULTIPLE TOOTH EXTRACTIONS     REPLACEMENT TOTAL KNEE  08/2008; 09/2001   right   REVERSE SHOULDER ARTHROPLASTY Left 09/03/2017   Procedure: REVERSE SHOULDER ARTHROPLASTY;  Surgeon: Meredith Pel, MD;  Location: [facility];  Service: Orthopedics;  Laterality: Left;   REVERSE SHOULDER ARTHROPLASTY Right 05/13/2018   Procedure: RIGHT REVERSE SHOULDER ARTHROPLASTY;  Surgeon: Meredith Pel, MD;  Location: [facility];  Service: Orthopedics;  Laterality: Right;   rod placement Right 03/2009   R knee   SYNOVECTOMY  06/2005   debridement, liner exchange right knee   TOE AMPUTATION Left 08/2008   great toe - osteomyelitis (staph infection)   TOE AMPUTATION Left 04/2016   2nd toe [name]   TONSILLECTOMY     TOTAL KNEE ARTHROPLASTY Left 01/15/2017   TOTAL KNEE ARTHROPLASTY Left 01/15/2017   Procedure: TOTAL KNEE ARTHROPLASTY;  Surgeon: Meredith Pel, MD;  Location: [facility];  Service: Orthopedics;  Laterality: Left;   Social History   Occupational History   Occupation: retired  Tobacco Use   Smoking status: Never Smoker   Smokeless tobacco: Never Used  Scientific laboratory technician Use: Never used  Substance and Sexual Activity   Alcohol use: No    Alcohol/week: 0.0 standard drinks    Comment: "I abused alcohol; last drink  ~ 2005"   Drug use: No   Sexual activity: Never

## 2020-04-25 ENCOUNTER — Other Ambulatory Visit: Payer: Self-pay | Admitting: Surgical

## 2020-04-25 ENCOUNTER — Telehealth: Payer: Self-pay

## 2020-04-25 NOTE — Telephone Encounter (Signed)
Can you please call patient? Looks like Nicholas [name] called to schedule patients MRI review. Needs to be scheduled to see Dr [name] Sa after 04/26/20. Thanks.

## 2020-04-25 NOTE — Telephone Encounter (Signed)
Patient called in saying he received a missed call from [place] was returning call

## 2020-04-25 NOTE — Telephone Encounter (Signed)
Pls advise.  

## 2020-04-26 ENCOUNTER — Ambulatory Visit
Admission: RE | Admit: 2020-04-26 | Discharge: 2020-04-26 | Disposition: A | Discharge status: Home / Self Care | Payer: PPO | Source: Ambulatory Visit | Attending: Orthopedic Surgery | Admitting: Orthopedic Surgery

## 2020-04-26 ENCOUNTER — Other Ambulatory Visit: Payer: Self-pay

## 2020-04-26 DIAGNOSIS — R202 Paresthesia of skin: Secondary | ICD-10-CM

## 2020-04-26 DIAGNOSIS — M48061 Spinal stenosis, lumbar region without neurogenic claudication: Secondary | ICD-10-CM | POA: Diagnosis not present

## 2020-04-26 DIAGNOSIS — M79604 Pain in right leg: Secondary | ICD-10-CM

## 2020-05-01 ENCOUNTER — Other Ambulatory Visit: Payer: Self-pay | Admitting: Family Medicine

## 2020-05-04 ENCOUNTER — Ambulatory Visit: Payer: PPO | Admitting: Orthopedic Surgery

## 2020-05-04 DIAGNOSIS — R202 Paresthesia of skin: Secondary | ICD-10-CM | POA: Diagnosis not present

## 2020-05-04 DIAGNOSIS — R200 Anesthesia of skin: Secondary | ICD-10-CM

## 2020-05-04 MED ORDER — METHOCARBAMOL 500 MG PO TABS
500.0000 mg | ORAL_TABLET | Freq: Three times a day (TID) | ORAL | 0 refills | Status: DC | PRN
Start: 2020-05-04 — End: 2021-03-01

## 2020-05-07 ENCOUNTER — Encounter: Payer: Self-pay | Admitting: Orthopedic Surgery

## 2020-05-07 NOTE — Progress Notes (Signed)
Office Visit Note   Patient: Nicholas [name]           Date of Birth: 1950-11-22           MRN: [id number] Visit Date: 05/04/2020 Requested by: [name], MD [place],  [place] [address] PCP: [name], MD  Subjective: Chief Complaint  Patient presents with  . Follow-up    HPI: Nicholas [name] is a patient with right greater than left leg radicular pain and symptoms.  MRI scan is reviewed.  He does have a residual disc at L4-5 which does not appear to be giving him clinical symptoms.  He also has some mild right subarticular narrowing which could be giving him symptoms.  Denies any groin pain.  Reports pain from the leg to the foot.  No fevers or chills.  Symptoms are worse after walking.  He has had injections for his back and leg pain which have not helped.              ROS: All systems reviewed are negative as they relate to the chief complaint within the history of present illness.  Patient denies  fevers or chills.   Assessment & Plan: Visit Diagnoses:  1. Numbness and tingling of both feet     Plan: Impression is left-sided and right-sided radicular symptoms with right greater than left leg pain.  MRI scan does show a little bit of residual subarticular stenosis on the right-hand side.  I do not think there is any nonoperative intervention which would be predictably helpful.  We will try muscle relaxer.  Nicholas [name] does not really want any shots or injections.  I will see him back as needed.  Follow-Up Instructions: Return if symptoms worsen or fail to improve.   Orders:  No orders of the defined types were placed in this encounter.  Meds ordered this encounter  Medications  . methocarbamol (ROBAXIN) 500 MG tablet    Sig: Take 1 tablet (500 mg total) by mouth every 8 (eight) hours as needed for muscle spasms.    Dispense:  40 tablet    Refill:  0      Procedures: No procedures performed   Clinical Data: No additional  findings.  Objective: Vital Signs: There were no vitals taken for this visit.  Physical Exam:   Constitutional: Patient appears well-developed HEENT:  Head: Normocephalic Eyes:EOM are normal Neck: Normal range of motion Cardiovascular: Normal rate Pulmonary/chest: Effort normal Neurologic: Patient is alert Skin: Skin is warm Psychiatric: Patient has normal mood and affect    Ortho Exam: Ortho exam unchanged from prior visit.  Right leg fused left leg has good flexion from total knee replacement.  No weakness or atrophy.  Walking with no assistive devices.  Specialty Comments:  No specialty comments available.  Imaging: No results found.   PMFS History: Patient Active Problem List   Diagnosis Date Noted  . Vitamin B12 deficiency 03/31/2020  . Vitamin D deficiency 03/31/2020  . Numbness of foot 03/25/2020  . Right hand pain 12/14/2019  . Status post amputation of great toe, left ([place]) 12/14/2019  . Callus of foot 04/02/2019  . Allergy to bee sting 03/11/2019  . Insomnia 02/10/2019  . Status post lumbar spine operative procedure for decompression of spinal cord 12/12/2018  . Dyslipidemia 10/19/2018  . Foot deformity 10/19/2018  . Spinal stenosis of lumbar region without neurogenic claudication 10/01/2018  . Facial rash 05/26/2018  . Status post reverse total replacement of right  shoulder 05/13/2018  . Epigastric pain 02/26/2018  . CHF (congestive heart failure) ([place]) 12/27/2017  . OSA (obstructive sleep apnea) 12/27/2017  . Health maintenance examination 11/25/2017  . Renal insufficiency 10/01/2017  . Fatty liver 10/01/2017  . Status post reverse total replacement of left shoulder 09/03/2017  . Bilateral shoulder pain 05/24/2017  . History of CVA in adulthood 03/08/2017  . S/P total knee arthroplasty, left 01/30/2017  . Arthritis of knee 01/15/2017  . Skin rash 12/10/2016  . Medicare annual wellness visit, initial 10/05/2016  . Advanced care planning/counseling  discussion 10/05/2016  . Morbid obesity with BMI of 40.0-44.9, adult ([place]) 07/19/2016  . Hypothyroidism   . Iron deficiency 04/23/2016  . Chronic pain syndrome 04/23/2016  . Inflammatory arthritis 04/23/2016  . Chronic leg pain 04/23/2016  . Lesion of palate 04/23/2016  . Grieving 04/23/2016  . GERD (gastroesophageal reflux disease)   . Duodenitis determined by biopsy 02/13/2016  . Diverticulosis of colon without hemorrhage 01/31/2016  . Chest pain 01/11/2012  . Migraine 01/11/2012  . History of methicillin resistant staphylococcus aureus (MRSA) 07/04/2010  . DEGENERATIVE DISC DISEASE 07/04/2010  . History of total right knee replacement 07/04/2010  . Primary osteoarthritis, right shoulder 06/09/2007  . Personal history of colonic adenoma 06/01/2003   Past Medical History:  Diagnosis Date  . Acute kidney failure 08/2008   "cleared up"  no problems since  . Allergy   . Anxiety   . Arthritis   . Asthma    ?of this no inhaler  . Blood transfusion   . CHF (congestive heart failure) ([facility])    ?of this, pt denies  . CLOSTRIDIUM DIFFICILE COLITIS 07/04/2010   Annotation: 12/09, 2/10 Qualifier: Diagnosis of  By: [name], [name]    . Depression with anxiety   . Duodenitis determined by biopsy 02/2016   peptic likely due to aleve (erosive gastropathy with duodenal erosions)  . ECZEMA 07/04/2010   Qualifier: Diagnosis of  By: [name], [name]    . Elevated liver enzymes   . Full dentures   . GERD (gastroesophageal reflux disease)   . Heart attack ([facility])    08/2008 (likely demand ischemia in the setting of MSSA sepsis/R TKA  infection)10-2008  . History of hiatal hernia   . History of stomach ulcers   . Hypothyroidism   . Lower GI bleeding   . MALAR AND MAXILLARY BONES CLOSED FRACTURE 07/04/2010   Annotation: ORIF Qualifier: Diagnosis of  By: [name], [name]    . Migraine    "definitely"  . MRSA (methicillin resistant Staphylococcus aureus)    in leg, had to place steel rod in  leg  . Personal history of colonic adenoma 06/01/2003  . PFO (patent foramen ovale)    small PFO by 08/2008 TEE  . Pneumonia 08/2008   "while in ICU"  . Seizures ([place]) 2009   "long time ago"    . Sleep apnea    does not wear CPAP  . Stroke [facility]) 2010   unable to complete sentences at times  . Wears glasses     Family History  Problem Relation Age of Onset  . Coronary artery disease Father 32  . Stroke Father   . CAD Mother 57       stents  . Breast cancer Sister   . Diabetes Brother   . Diabetes Sister   . Colon cancer Neg Hx   . Stomach cancer Neg Hx     Past Surgical History:  Procedure Laterality  Date  . anterior  nerve transposition  07/2009   left ulnar nerve  . antibiotic spacer exchange  11/2008; 08/2006   right knee  . ARTHROTOMY  08/2008   right knee w/I&D  . CARDIOVASCULAR STRESS TEST  2013   stress EKG - negative for ischemia, 4 min 7.3 METs, normal blood pressure response  . CATARACT EXTRACTION    . COLONOSCOPY  11/2012   diverticulosis, rpt 10 yrs [name])  . ESOPHAGOGASTRODUODENOSCOPY  02/2016   erosive gastropathy with duodenal erosions [name])  . HARDWARE REMOVAL  04/2006   right knee w/antibiotic spacers placed  . INGUINAL HERNIA REPAIR  early 1990's   bilateral  . JOINT REPLACEMENT Bilateral   . KNEE ARTHROSCOPY  10/2001   right  . KNEE FUSION  03/2009   right knee removal; antibiotic spacers;   . LEFT HEART CATH AND CORONARY ANGIOGRAPHY N/A 02/04/2018    WNL (Patwardhan, Reynold Bowen, MD)  . LUMBAR LAMINECTOMY/DECOMPRESSION MICRODISCECTOMY N/A 12/05/2018   Procedure: LUMBAR THREE TO FOUR AND LUMBAR FOUR TO FIVE DECOMPRESSION;  Surgeon: [name], MD;  Location: [facility];  Service: Orthopedics;  Laterality: N/A;  . MULTIPLE TOOTH EXTRACTIONS    . REPLACEMENT TOTAL KNEE  08/2008; 09/2001   right  . REVERSE SHOULDER ARTHROPLASTY Left 09/03/2017   Procedure: REVERSE SHOULDER ARTHROPLASTY;  Surgeon: Meredith Pel, MD;  Location: [facility];  Service:  Orthopedics;  Laterality: Left;  . REVERSE SHOULDER ARTHROPLASTY Right 05/13/2018   Procedure: RIGHT REVERSE SHOULDER ARTHROPLASTY;  Surgeon: Meredith Pel, MD;  Location: [facility];  Service: Orthopedics;  Laterality: Right;  . rod placement Right 03/2009   R knee  . SYNOVECTOMY  06/2005   debridement, liner exchange right knee  . TOE AMPUTATION Left 08/2008   great toe - osteomyelitis (staph infection)  . TOE AMPUTATION Left 04/2016   2nd toe [name]  . TONSILLECTOMY    . TOTAL KNEE ARTHROPLASTY Left 01/15/2017  . TOTAL KNEE ARTHROPLASTY Left 01/15/2017   Procedure: TOTAL KNEE ARTHROPLASTY;  Surgeon: Meredith Pel, MD;  Location: [facility];  Service: Orthopedics;  Laterality: Left;   Social History   Occupational History  . Occupation: retired  Tobacco Use  . Smoking status: Never Smoker  . Smokeless tobacco: Never Used  Vaping Use  . Vaping Use: Never used  Substance and Sexual Activity  . Alcohol use: No    Alcohol/week: 0.0 standard drinks    Comment: "I abused alcohol; last drink  ~ 2005"  . Drug use: No  . Sexual activity: Never

## 2020-05-19 ENCOUNTER — Ambulatory Visit (INDEPENDENT_AMBULATORY_CARE_PROVIDER_SITE_OTHER): Payer: PPO | Admitting: *Deleted

## 2020-05-19 ENCOUNTER — Other Ambulatory Visit: Payer: Self-pay

## 2020-05-19 DIAGNOSIS — E538 Deficiency of other specified B group vitamins: Secondary | ICD-10-CM | POA: Diagnosis not present

## 2020-05-19 MED ORDER — CYANOCOBALAMIN 1000 MCG/ML IJ SOLN
1000.0000 ug | Freq: Once | INTRAMUSCULAR | Status: AC
  Administered 2020-05-19: 1000 ug via INTRAMUSCULAR

## 2020-05-19 NOTE — Progress Notes (Signed)
Per orders of Dr. Danise Mina, injection of B12 1000 mcg/mL given by Alayja Armas. Patient tolerated injection well.

## 2020-05-30 ENCOUNTER — Other Ambulatory Visit: Payer: Self-pay | Admitting: Surgical

## 2020-05-30 NOTE — Telephone Encounter (Signed)
Pls advise.  

## 2020-05-31 NOTE — Telephone Encounter (Signed)
Just needs OTC supplementation after the last course of 50,000 units q week

## 2020-06-14 ENCOUNTER — Other Ambulatory Visit: Payer: Self-pay | Admitting: Family Medicine

## 2020-06-15 ENCOUNTER — Encounter: Payer: Self-pay | Admitting: Family Medicine

## 2020-06-15 ENCOUNTER — Other Ambulatory Visit: Payer: Self-pay

## 2020-06-15 ENCOUNTER — Ambulatory Visit (INDEPENDENT_AMBULATORY_CARE_PROVIDER_SITE_OTHER): Payer: PPO | Admitting: Family Medicine

## 2020-06-15 VITALS — BP 120/76 | HR 58 | Temp 97.5°F | Ht 69.5 in | Wt 269.5 lb

## 2020-06-15 DIAGNOSIS — E538 Deficiency of other specified B group vitamins: Secondary | ICD-10-CM | POA: Diagnosis not present

## 2020-06-15 DIAGNOSIS — E559 Vitamin D deficiency, unspecified: Secondary | ICD-10-CM

## 2020-06-15 DIAGNOSIS — R200 Anesthesia of skin: Secondary | ICD-10-CM

## 2020-06-15 LAB — VITAMIN D 25 HYDROXY (VIT D DEFICIENCY, FRACTURES): VITD: 45 ng/mL (ref 30.00–100.00)

## 2020-06-15 LAB — FOLATE: Folate: 9.1 ng/mL (ref >5.9)

## 2020-06-15 LAB — VITAMIN B12: Vitamin B-12: 370 pg/mL (ref 211–911)

## 2020-06-15 MED ORDER — GABAPENTIN 300 MG PO CAPS: ORAL_CAPSULE | ORAL | 1 refills | Status: DC

## 2020-06-15 MED ORDER — CYANOCOBALAMIN 1000 MCG/ML IJ SOLN
1000.0000 ug | Freq: Once | INTRAMUSCULAR | Status: AC
  Administered 2020-06-15: 1000 ug via INTRAMUSCULAR

## 2020-06-15 NOTE — Assessment & Plan Note (Signed)
Has completed 6 months of B12 injections. Update B12 levels, will see if we can add IF Ab to blood drawn.  B12 shot today then decide on further dosing based on B12 level.

## 2020-06-15 NOTE — Progress Notes (Signed)
This visit was conducted in person.  BP 120/76 (BP Location: Left Arm, Patient Position: Sitting, Cuff Size: Large)   Pulse (!) 58   Temp (!) 97.5 F (36.4 C) (Temporal)   Ht 5' 9.5" (1.765 m)   Wt 269 lb 8 oz (122.2 kg)   SpO2 96%   BMI 39.23 kg/m    CC: 6 mo f/u visit  Subjective:    Patient ID: Nicholas [name], male    DOB: [birth year], 69 y.o.   MRN: [id number]  HPI: Nicholas [name] is a 69 y.o. male presenting on 06/15/2020 for Follow-up (Here for 6 mo f/u.)   33 yo mother died 3 wks ago.   Recent MRI showing mild R residual subarticular stenosis - not deemed operative. Ongoing R>L radicular pain. Treating with muscle relaxant. Sees ortho.   Found to have B12 and D deficiency  Has been receiving monthly B12 shots over the past 6 months.  Has been receiving vit D 50k units weekly.   Ongoing bilateral foot numbness. No significant burning pain.  Continues tylenol 1000mg  TID.   COVID vaccine - discussed, declines     Relevant past medical, surgical, family and social history reviewed and updated as indicated. Interim medical history since our last visit reviewed. Allergies and medications reviewed and updated. Outpatient Medications Prior to Visit  Medication Sig Dispense Refill  . acetaminophen (TYLENOL) 500 MG tablet Take 1,000 mg by mouth 2 (two) times daily. IN THE AFTERNOON & AT NIGHT    . amitriptyline (ELAVIL) 50 MG tablet TAKE 1-2 TABLETS (50-100 MG TOTAL) BY MOUTH AT BEDTIME. 180 tablet 3  . atorvastatin (LIPITOR) 20 MG tablet TAKE 1 TABLET BY MOUTH EVERY DAY 90 tablet 1  . clotrimazole (LOTRIMIN) 1 % cream Apply 1 application topically 2 (two) times daily. To groin (Patient taking differently: Apply 1 application topically 3 (three) times daily. To groin) 60 g 1  . EPINEPHrine 0.3 mg/0.3 mL IJ SOAJ injection Inject 0.3 mLs (0.3 mg total) into the muscle as needed for anaphylaxis. (bee stings) 2 each 1  . ferrous sulfate 325 (65 FE) MG EC tablet Take 325 mg by mouth  every other day.    . furosemide (LASIX) 20 MG tablet TAKE 1 TABLET BY MOUTH EVERY DAY AS NEEDED FOR FLUID OR EDEMA 90 tablet 1  . ketoconazole (NIZORAL) 2 % shampoo Apply 1 application topically 3 (three) times a week. 120 mL 3  . levothyroxine (SYNTHROID) 100 MCG tablet TAKE 1 TABLET (100 MCG TOTAL) BY MOUTH DAILY BEFORE BREAKFAST. 90 tablet 2  . methocarbamol (ROBAXIN) 500 MG tablet Take 1 tablet (500 mg total) by mouth every 8 (eight) hours as needed. 30 tablet 0  . methocarbamol (ROBAXIN) 500 MG tablet Take 1 tablet (500 mg total) by mouth every 8 (eight) hours as needed for muscle spasms. 40 tablet 0  . metoprolol succinate (TOPROL-XL) 100 MG 24 hr tablet TAKE 1 TABLET BY MOUTH EVERY DAY 90 tablet 3  . nitroGLYCERIN (NITROSTAT) 0.4 MG SL tablet Place 1 tablet (0.4 mg total) under the tongue every 5 (five) minutes as needed for chest pain. 20 tablet 3  . pantoprazole (PROTONIX) 40 MG tablet TAKE 1 TABLET BY MOUTH EVERY DAY 90 tablet 3  . SUMAtriptan (IMITREX) 100 MG tablet TAKE 1 TAB AS NEEDED FOR MIGRAINE (REPEAT IN 2 HOURS IF NEEDED). LIMIT TO 2/24 HOUR PERIOD 12 tablet 0  . Vitamin D, Ergocalciferol, (DRISDOL) 1.25 MG (50000 UNIT) CAPS capsule TAKE 1 CAPSULE (  50,000 UNITS TOTAL) BY MOUTH EVERY 7 (SEVEN) DAYS. 5 capsule 0  . gabapentin (NEURONTIN) 300 MG capsule Take 1 capsule (300 mg total) by mouth in the morning AND 2 capsules (600 mg total) 2 (two) times daily. (lunch and dinner). 450 capsule 1   No facility-administered medications prior to visit.     Per HPI unless specifically indicated in ROS section below Review of Systems Objective:  BP 120/76 (BP Location: Left Arm, Patient Position: Sitting, Cuff Size: Large)   Pulse (!) 58   Temp (!) 97.5 F (36.4 C) (Temporal)   Ht 5' 9.5" (1.765 m)   Wt 269 lb 8 oz (122.2 kg)   SpO2 96%   BMI 39.23 kg/m   Wt Readings from Last 3 Encounters:  06/15/20 269 lb 8 oz (122.2 kg)  03/23/20 278 lb 9.6 oz (126.4 kg)  12/14/19 279 lb 5 oz  (126.7 kg)      Physical Exam Vitals and nursing note reviewed.  Constitutional:      Appearance: Normal appearance. He is not ill-appearing.  Cardiovascular:     Rate and Rhythm: Normal rate and regular rhythm.     Pulses: Normal pulses.     Heart sounds: Normal heart sounds. No murmur heard.   Pulmonary:     Effort: Pulmonary effort is normal. No respiratory distress.     Breath sounds: Normal breath sounds. No wheezing, rhonchi or rales.  Musculoskeletal:     Right lower leg: No edema.     Left lower leg: No edema.     Comments: R leg fixed in extension  Neurological:     Mental Status: He is alert.  Psychiatric:        Mood and Affect: Mood normal.        Behavior: Behavior normal.       Results for orders placed or performed in visit on 03/25/20  Vitamin D (25 hydroxy)  Result Value Ref Range   Vit D, 25-Hydroxy 13 (L) 30 - 100 ng/mL   Lab Results  Component Value Date   VITAMINB12 196 (L) 03/23/2020    Assessment & Plan:  This visit occurred during the SARS-CoV-2 public health emergency.  Safety protocols were in place, including screening questions prior to the visit, additional usage of staff PPE, and extensive cleaning of exam room while observing appropriate contact time as indicated for disinfecting solutions.   Problem List Items Addressed This Visit    Vitamin D deficiency    Has received 5 weeks of 50k replacement.  Update levels, then determine subsequent dosing.       Vitamin B12 deficiency - Primary    Has completed 6 months of B12 injections. Update B12 levels, will see if we can add IF Ab to blood drawn.  B12 shot today then decide on further dosing based on B12 level.       Relevant Orders   Intrinsic Factor Antibodies   Numbness of foot    Ongoing. Continues gabapentin (refilled). Anticipate component of periph neuropathy as well as component of lumbar radiculopathy  Check folate and SPEP.       Relevant Orders   Vitamin B12   VITAMIN D  25 Hydroxy (Vit-D Deficiency, Fractures)   Folate   Serum protein electrophoresis with reflex       Meds ordered this encounter  Medications  . gabapentin (NEURONTIN) 300 MG capsule    Sig: Take 1 capsule (300 mg total) by mouth in the morning AND 2 capsules (  600 mg total) 2 (two) times daily. (lunch and dinner).    Dispense:  450 capsule    Refill:  1    Note new sig  . cyanocobalamin ((VITAMIN B-12)) injection 1,000 mcg   Orders Placed This Encounter  Procedures  . Vitamin B12  . VITAMIN D 25 Hydroxy (Vit-D Deficiency, Fractures)  . Folate  . Serum protein electrophoresis with reflex  . Intrinsic Factor Antibodies    Patient Instructions  Labs today then B12 shot.  We will determine dose of vitamin D replacement based on lab results.  Good to see you today, return in 6 months for wellness visit/physical.    Follow up plan: Return in about 6 months (around 12/13/2020) for annual exam, prior fasting for blood work, medicare wellness visit.  [name], MD

## 2020-06-15 NOTE — Patient Instructions (Addendum)
Labs today then B12 shot.  We will determine dose of vitamin D replacement based on lab results.  Good to see you today, return in 6 months for wellness visit/physical.

## 2020-06-15 NOTE — Assessment & Plan Note (Addendum)
Ongoing. Continues gabapentin (refilled). Anticipate component of periph neuropathy as well as component of lumbar radiculopathy  Check folate and SPEP.

## 2020-06-15 NOTE — Assessment & Plan Note (Signed)
Has received 5 weeks of 50k replacement.  Update levels, then determine subsequent dosing.

## 2020-06-17 LAB — PROTEIN ELECTROPHORESIS, SERUM, WITH REFLEX
Albumin ELP: 3.8 g/dL (ref 3.8–4.8)
Alpha 1: 0.3 g/dL (ref 0.2–0.3)
Alpha 2: 0.8 g/dL (ref 0.5–0.9)
Beta 2: 0.5 g/dL (ref 0.2–0.5)
Beta Globulin: 0.5 g/dL (ref 0.4–0.6)
Gamma Globulin: 1.1 g/dL (ref 0.8–1.7)
Total Protein: 7 g/dL (ref 6.1–8.1)

## 2020-06-18 LAB — INTRINSIC FACTOR ANTIBODIES: Intrinsic Factor: NEGATIVE

## 2020-06-28 ENCOUNTER — Ambulatory Visit (INDEPENDENT_AMBULATORY_CARE_PROVIDER_SITE_OTHER): Payer: PPO | Admitting: Podiatry

## 2020-06-28 ENCOUNTER — Other Ambulatory Visit: Payer: Self-pay

## 2020-06-28 ENCOUNTER — Encounter: Payer: Self-pay | Admitting: Podiatry

## 2020-06-28 DIAGNOSIS — B351 Tinea unguium: Secondary | ICD-10-CM

## 2020-06-28 DIAGNOSIS — M79674 Pain in right toe(s): Secondary | ICD-10-CM

## 2020-06-28 DIAGNOSIS — M79675 Pain in left toe(s): Secondary | ICD-10-CM | POA: Diagnosis not present

## 2020-06-28 DIAGNOSIS — S98132A Complete traumatic amputation of one left lesser toe, initial encounter: Secondary | ICD-10-CM

## 2020-06-28 NOTE — Progress Notes (Signed)
This patient returns to my office for at risk foot care.  This patient requires this care by a professional since this patient will be at risk due to having amputation left great toe left foot and neuropathy and renal insufficiency..  This patient is unable to cut nails himself since the patient cannot reach his nails.These nails are painful walking and wearing shoes.  This patient presents for at risk foot care today.  General Appearance  Alert, conversant and in no acute stress.  Vascular  Dorsalis pedis and posterior tibial  pulses are weakly  palpable  bilaterally.  Capillary return is within normal limits  bilaterally. Temperature is within normal limits  bilaterally.  Neurologic  Senn-Weinstein monofilament wire test diminished   bilaterally. Muscle power within normal limits bilaterally.  Nails Thick disfigured discolored nails with subungual debris  from hallux to fifth toes right and 3-5 left foot.. No evidence of bacterial infection or drainage bilaterally.  Orthopedic  No limitations of motion  feet .  No crepitus or effusions noted.  No bony pathology or digital deformities noted. Amputation 1,2 left foot.  Skin  normotropic skin with no porokeratosis noted bilaterally.  No signs of infections or ulcers noted.     Onychomycosis  Pain in right toes  Pain in left toes  Consent was obtained for treatment procedures.   Mechanical debridement of nails 1-5  Right and 3-5 left. performed with a nail nipper.  Filed with dremel without incident.    Return office visit   10 weeks                   Told patient to return for periodic foot care and evaluation due to potential at risk complications.   Gardiner Barefoot DPM

## 2020-07-21 ENCOUNTER — Telehealth: Payer: Self-pay

## 2020-07-21 NOTE — Telephone Encounter (Signed)
[facility] - Client TELEPHONE ADVICE RECORD [facility] Patient Name: Nicholas [name] Gender: Male DOB: 11/05/1950 Age: 69 Y 56 M 1 D Return Phone Number: [phone] (Primary) [place] [address] Client [facility] - Client Client Site [facility] - Day Physician [name] - MD Contact Type Call Who Is Calling Patient / Member / Family / Caregiver Call Type Triage / Clinical Relationship To Patient Self Return Phone Number [phone] (Primary) Chief Complaint Headache Reason for Call Symptomatic / Request for [place] states he has brown places on sides of head, puffy, headaches, and it has been going on for a couple days. Headache per charge. Translation No Nurse Assessment Nurse: Joline Salt, RN, Malachy Mood Date/Time Eilene Ghazi Time): 07/21/2020 2:34:03 PM Confirm and document reason for call. If symptomatic, describe symptoms. ---Caller states he has brown places on sides of head, puffy and having headaches, and it has been going on for a couple days. Rates headache 10/10 and hurts behind eyes. Does the patient have any new or worsening symptoms? ---Yes Will a triage be completed? ---Yes Related visit to physician within the last 2 weeks? ---Yes Does the PT have any chronic conditions? (i.e. diabetes, asthma, this includes High risk factors for pregnancy, etc.) ---Yes List chronic conditions. ---migraines Is this a behavioral health or substance abuse call? ---No Guidelines Guideline Title Affirmed Question Affirmed Notes Nurse Date/Time (Eastern Time) Headache [1] SEVERE headache (e.g., excruciating) AND [2] not improved after 2 hours of pain medicine Catha Brow 07/21/2020 2:36:31 PM Disp. Time Eilene Ghazi Time) Disposition Final User 07/21/2020 2:40:10 PM See HCP within 4 Hours (or PCP triage) Yes Joline Salt, RN, Malachy Mood PLEASE  NOTE: All timestamps contained within this report are represented as [place] Standard Time. CONFIDENTIALTY NOTICE: This fax transmission is intended only for the addressee. It contains information that is legally privileged, confidential or otherwise protected from use or disclosure. If you are not the intended recipient, you are strictly prohibited from reviewing, disclosing, copying using or disseminating any of this information or taking any action in reliance on or regarding this information. If you have received this fax in error, please notify us immediately by telephone so that we can arrange for its return to [place]. Phone: [phone], Toll-Free: [phone], Fax: [phone] Page: 2 of 2 Call Id: [id number] [place] Disagree/Comply Comply Caller Understands Yes PreDisposition Go to Urgent Care/Walk-In Clinic Care Advice Given Per Guideline SEE HCP (OR PCP TRIAGE) WITHIN 4 HOURS: CALL BACK IF: * You become worse ANOTHER ADULT SHOULD DRIVE: * It is better and safer if another adult drives instead of you. Comments User: Margretta Ditty, RN Date/Time Eilene Ghazi Time): 07/21/2020 2:35:28 PM Taking Imitrex and is not helping. Referrals GO TO FACILITY UNDECIDED

## 2020-07-21 NOTE — Telephone Encounter (Signed)
Pt reports HA, not migraine, for 2 days behind both eyes. He also reports two brown spots the size of eraser heads on each temple that are swollen that he thinks are correlated to the HA. He said people have mentioned the spots before to him but he has never seen them until now. Pt reports he is not willing to go to the ER or UC due to not wanting to be around sick people. He has tried imitrex and Tylenol with little relief. Pt is currently relaxing and trying to rest. Pt reports he does not have any problem falling asleep to get relief from the HA. pt denies any other symptoms. Advised pt to use either cool or warm towels on the forehead or behind the neck to help with relaxation. Advised pt there are no apts available tomorrow currently. But if HA worsened he could call EMS and they could take him to ER. He said he did not want to do that. Advised a msg would be sent to PCP and if he would like he could contact the office in the morning to see if any apts cancelled. Pt appreciative and verbalized understanding.

## 2020-07-22 NOTE — Telephone Encounter (Signed)
Plz call Monday for update on HA. Schedule OV if ongoing with next available provider.

## 2020-07-25 NOTE — Telephone Encounter (Addendum)
Lvm asking pt to call back.  Need an update and schedule OV with available provider, per Dr. Darnell Level, if ongoing HA.

## 2020-07-25 NOTE — Telephone Encounter (Addendum)
Spoke with pt to get an update.  Says HA is not any better.  Pt scheduled on 07/27/20, with Dr. Einar Pheasant,  at 8:00.  FYI to Dr. Darnell Level.

## 2020-07-27 ENCOUNTER — Encounter: Payer: Self-pay | Admitting: Family Medicine

## 2020-07-27 ENCOUNTER — Other Ambulatory Visit: Payer: Self-pay

## 2020-07-27 ENCOUNTER — Ambulatory Visit (INDEPENDENT_AMBULATORY_CARE_PROVIDER_SITE_OTHER): Payer: PPO | Admitting: Family Medicine

## 2020-07-27 VITALS — BP 100/70 | HR 54 | Temp 97.2°F | Wt 266.5 lb

## 2020-07-27 DIAGNOSIS — Z20822 Contact with and (suspected) exposure to covid-19: Secondary | ICD-10-CM

## 2020-07-27 DIAGNOSIS — G8929 Other chronic pain: Secondary | ICD-10-CM | POA: Diagnosis not present

## 2020-07-27 DIAGNOSIS — R519 Headache, unspecified: Secondary | ICD-10-CM

## 2020-07-27 MED ORDER — KETOROLAC TROMETHAMINE 60 MG/2ML IM SOLN
60.0000 mg | Freq: Once | INTRAMUSCULAR | Status: AC
Start: 2020-07-27 — End: 2020-07-27
  Administered 2020-07-27: 60 mg via INTRAMUSCULAR

## 2020-07-27 NOTE — Assessment & Plan Note (Signed)
Etiology unclear - new HA pattern - will rule-out covid given high case numbers and unvaccinated. Advised stopping taking sumatriptan as reports daily use x 1 week - concerning for rebound HA. Give age and new onset of HA if Covid testing is negative and HA not improved with Toradol given today (discussed risks of NSAIDs and GI bleeding and pt wanted to try) - will consider Brain MRI.

## 2020-07-27 NOTE — Progress Notes (Signed)
Subjective:     Nicholas [name] is a 69 y.o. male presenting for Migraine (x 1.5 weeks )     Migraine  This is a new problem. The current episode started 1 to 4 weeks ago. The problem occurs constantly. The problem has been unchanged. The pain is located in the bilateral and temporal region. The pain does not radiate. The quality of the pain is described as pulsating and aching. The pain is moderate. Pertinent negatives include no abdominal pain, blurred vision, coughing, drainage, ear pain, eye redness, fever, muscle aches, nausea, phonophobia, photophobia, rhinorrhea, sinus pressure, tingling, vomiting or weakness. Exacerbated by: standing up. He has tried acetaminophen and triptans (gabapentin) for the symptoms. The treatment provided no relief. His past medical history is significant for migraine headaches and obesity.   Review of Systems  Constitutional: Negative for fever.  HENT: Negative for ear pain, rhinorrhea and sinus pressure.   Eyes: Negative for blurred vision, photophobia and redness.  Respiratory: Negative for cough.   Gastrointestinal: Negative for abdominal pain, nausea and vomiting.  Neurological: Negative for tingling and weakness.   No hx of covid No vaccine for covid No known sick contact   Social History   Tobacco Use  Smoking Status Never Smoker  Smokeless Tobacco Never Used        Objective:    BP Readings from Last 3 Encounters:  07/27/20 100/70  06/15/20 120/76  03/23/20 112/72   Wt Readings from Last 3 Encounters:  07/27/20 266 lb 8 oz (120.9 kg)  06/15/20 269 lb 8 oz (122.2 kg)  03/23/20 278 lb 9.6 oz (126.4 kg)    BP 100/70   Pulse (!) 54   Temp (!) 97.2 F (36.2 C) (Temporal)   Wt 266 lb 8 oz (120.9 kg)   SpO2 96%   BMI 38.79 kg/m    Physical Exam Constitutional:      Appearance: Normal appearance. He is obese. He is not ill-appearing or diaphoretic.  HENT:     Head: Normocephalic and atraumatic.     Right Ear: Tympanic  membrane and external ear normal.     Left Ear: Tympanic membrane and external ear normal.     Nose: Nose normal. No congestion.     Mouth/Throat:     Mouth: Mucous membranes are moist.     Pharynx: No oropharyngeal exudate or posterior oropharyngeal erythema.  Eyes:     General: No scleral icterus.    Extraocular Movements: Extraocular movements intact.     Conjunctiva/sclera: Conjunctivae normal.     Pupils: Pupils are equal, round, and reactive to light.  Cardiovascular:     Rate and Rhythm: Normal rate and regular rhythm.     Heart sounds: No murmur heard.   Pulmonary:     Effort: Pulmonary effort is normal. No respiratory distress.     Breath sounds: Normal breath sounds. No wheezing.  Musculoskeletal:     Cervical back: Neck supple.     Comments: Right leg at extension 2/2 to rod  Skin:    General: Skin is warm and dry.     Comments: Benign appearing nevi on bilateral temples   Neurological:     General: No focal deficit present.     Mental Status: He is alert. Mental status is at baseline.     Cranial Nerves: No cranial nerve deficit.     Motor: No weakness.     Coordination: Coordination normal.  Psychiatric:        Mood  and Affect: Mood normal.           Assessment & Plan:   Problem List Items Addressed This Visit      Other   Chronic intractable headache - Primary    Etiology unclear - new HA pattern - will rule-out covid given high case numbers and unvaccinated. Advised stopping taking sumatriptan as reports daily use x 1 week - concerning for rebound HA. Give age and new onset of HA if Covid testing is negative and HA not improved with Toradol given today (discussed risks of NSAIDs and GI bleeding and pt wanted to try) - will consider Brain MRI.       Relevant Medications   ketorolac (TORADOL) injection 60 mg   Other Relevant Orders   Novel Coronavirus, NAA ([facility])    Other Visit Diagnoses    Suspected COVID-19 virus infection       Relevant Orders    Novel Coronavirus, NAA ([facility])       Return if symptoms worsen or fail to improve.  Lesleigh Noe, MD  This visit occurred during the SARS-CoV-2 public health emergency.  Safety protocols were in place, including screening questions prior to the visit, additional usage of staff PPE, and extensive cleaning of exam room while observing appropriate contact time as indicated for disinfecting solutions.

## 2020-07-29 ENCOUNTER — Telehealth: Payer: Self-pay

## 2020-07-29 DIAGNOSIS — G8929 Other chronic pain: Secondary | ICD-10-CM

## 2020-07-29 LAB — NOVEL CORONAVIRUS, NAA: SARS-CoV-2, NAA: NOT DETECTED

## 2020-07-29 LAB — SARS-COV-2, NAA 2 DAY TAT

## 2020-07-29 LAB — SPECIMEN STATUS REPORT

## 2020-07-29 NOTE — Telephone Encounter (Signed)
Pt called requesting results for recent covid test.... Results given and pt states he has had minimal improvement since office visit... please advise

## 2020-08-01 NOTE — Telephone Encounter (Signed)
Called pt and left VM requesting a call back to see how his headache is.

## 2020-08-01 NOTE — Telephone Encounter (Signed)
Please call patient.   If headache persisting would recommend he return for blood work.  If results are normal will consider head imaging.

## 2020-08-03 ENCOUNTER — Other Ambulatory Visit (INDEPENDENT_AMBULATORY_CARE_PROVIDER_SITE_OTHER): Payer: PPO

## 2020-08-03 ENCOUNTER — Ambulatory Visit (INDEPENDENT_AMBULATORY_CARE_PROVIDER_SITE_OTHER): Payer: PPO

## 2020-08-03 ENCOUNTER — Other Ambulatory Visit: Payer: Self-pay

## 2020-08-03 DIAGNOSIS — E538 Deficiency of other specified B group vitamins: Secondary | ICD-10-CM | POA: Diagnosis not present

## 2020-08-03 DIAGNOSIS — R519 Headache, unspecified: Secondary | ICD-10-CM

## 2020-08-03 DIAGNOSIS — G8929 Other chronic pain: Secondary | ICD-10-CM | POA: Diagnosis not present

## 2020-08-03 MED ORDER — CYANOCOBALAMIN 1000 MCG/ML IJ SOLN
1000.0000 ug | Freq: Once | INTRAMUSCULAR | Status: AC
  Administered 2020-08-03: 1000 ug via INTRAMUSCULAR

## 2020-08-03 NOTE — Progress Notes (Signed)
Per orders of Dr. Gutierrez, injection of vit B12 given by Ariannah Arenson. Patient tolerated injection well.  

## 2020-08-03 NOTE — Telephone Encounter (Signed)
Pt returning call.  Scheduled lab visit today at 4:00.

## 2020-08-04 LAB — CBC
HCT: 44.7 % (ref 39.0–52.0)
Hemoglobin: 15.1 g/dL (ref 13.0–17.0)
MCHC: 33.9 g/dL (ref 30.0–36.0)
MCV: 94.6 fl (ref 78.0–100.0)
Platelets: 213 10*3/uL (ref 150.0–400.0)
RBC: 4.73 Mil/uL (ref 4.22–5.81)
RDW: 14 % (ref 11.5–15.5)
WBC: 7.8 10*3/uL (ref 4.0–10.5)

## 2020-08-04 LAB — SEDIMENTATION RATE: Sed Rate: 18 mm/hr (ref 0–20)

## 2020-08-05 LAB — COMPREHENSIVE METABOLIC PANEL
ALT: 16 U/L (ref 0–53)
AST: 21 U/L (ref 0–37)
Albumin: 4.1 g/dL (ref 3.5–5.2)
Alkaline Phosphatase: 89 U/L (ref 39–117)
BUN: 18 mg/dL (ref 6–23)
CO2: 31 mEq/L (ref 19–32)
Calcium: 8.8 mg/dL (ref 8.4–10.5)
Chloride: 100 mEq/L (ref 96–112)
Creatinine, Ser: 1.26 mg/dL (ref 0.40–1.50)
GFR: 62 mL/min (ref >60.00)
Glucose, Bld: 103 mg/dL — ABNORMAL HIGH (ref 70–99)
Potassium: 4.3 mEq/L (ref 3.5–5.1)
Sodium: 138 mEq/L (ref 135–145)
Total Bilirubin: 2.1 mg/dL — ABNORMAL HIGH (ref 0.2–1.2)
Total Protein: 6.7 g/dL (ref 6.0–8.3)

## 2020-08-08 ENCOUNTER — Other Ambulatory Visit: Payer: Self-pay | Admitting: Family Medicine

## 2020-08-08 DIAGNOSIS — G4452 New daily persistent headache (NDPH): Secondary | ICD-10-CM

## 2020-08-08 NOTE — Progress Notes (Signed)
MRI Brain ordered due to new onset of chronic headache.

## 2020-08-30 ENCOUNTER — Ambulatory Visit
Admission: RE | Admit: 2020-08-30 | Discharge: 2020-08-30 | Disposition: A | Discharge status: Home / Self Care | Payer: PPO | Source: Ambulatory Visit | Attending: Family Medicine | Admitting: Family Medicine

## 2020-08-30 DIAGNOSIS — G4452 New daily persistent headache (NDPH): Secondary | ICD-10-CM

## 2020-08-30 DIAGNOSIS — R9082 White matter disease, unspecified: Secondary | ICD-10-CM | POA: Diagnosis not present

## 2020-08-30 DIAGNOSIS — G9389 Other specified disorders of brain: Secondary | ICD-10-CM | POA: Diagnosis not present

## 2020-08-30 DIAGNOSIS — E237 Disorder of pituitary gland, unspecified: Secondary | ICD-10-CM | POA: Diagnosis not present

## 2020-08-30 DIAGNOSIS — R519 Headache, unspecified: Secondary | ICD-10-CM | POA: Diagnosis not present

## 2020-08-30 MED ORDER — GADOBENATE DIMEGLUMINE 529 MG/ML IV SOLN
20.0000 mL | Freq: Once | INTRAVENOUS | Status: AC | PRN
  Administered 2020-08-30: 20 mL via INTRAVENOUS

## 2020-08-31 ENCOUNTER — Telehealth: Payer: Self-pay

## 2020-08-31 NOTE — Telephone Encounter (Signed)
Patient walked in to the clinic to update phone number and is asking if someone could call him with the MRI results.

## 2020-08-31 NOTE — Telephone Encounter (Signed)
Spoke to pt.  See result note. 

## 2020-09-01 ENCOUNTER — Telehealth: Payer: Self-pay | Admitting: Family Medicine

## 2020-09-01 NOTE — Telephone Encounter (Signed)
Patient would like to discuss his MRI that he just had done. Please call him back at [phone]. EM

## 2020-09-05 ENCOUNTER — Encounter: Payer: Self-pay | Admitting: Podiatry

## 2020-09-05 NOTE — Telephone Encounter (Signed)
Pt has appt scheduled for tomorrow - will discuss then.

## 2020-09-06 ENCOUNTER — Encounter: Payer: Self-pay | Admitting: Podiatry

## 2020-09-06 ENCOUNTER — Other Ambulatory Visit: Payer: Self-pay

## 2020-09-06 ENCOUNTER — Encounter: Payer: Self-pay | Admitting: Family Medicine

## 2020-09-06 ENCOUNTER — Ambulatory Visit (INDEPENDENT_AMBULATORY_CARE_PROVIDER_SITE_OTHER): Payer: PPO | Admitting: Family Medicine

## 2020-09-06 VITALS — BP 118/72 | HR 54 | Temp 97.7°F | Ht 69.5 in | Wt 267.0 lb

## 2020-09-06 DIAGNOSIS — D352 Benign neoplasm of pituitary gland: Secondary | ICD-10-CM | POA: Diagnosis not present

## 2020-09-06 DIAGNOSIS — G8929 Other chronic pain: Secondary | ICD-10-CM | POA: Diagnosis not present

## 2020-09-06 DIAGNOSIS — E039 Hypothyroidism, unspecified: Secondary | ICD-10-CM

## 2020-09-06 DIAGNOSIS — R519 Headache, unspecified: Secondary | ICD-10-CM

## 2020-09-06 DIAGNOSIS — E538 Deficiency of other specified B group vitamins: Secondary | ICD-10-CM

## 2020-09-06 DIAGNOSIS — E559 Vitamin D deficiency, unspecified: Secondary | ICD-10-CM

## 2020-09-06 LAB — LUTEINIZING HORMONE: LH: 4.75 m[IU]/mL

## 2020-09-06 LAB — FOLLICLE STIMULATING HORMONE: FSH: 9.8 m[IU]/mL (ref 1.4–18.1)

## 2020-09-06 LAB — TSH: TSH: 2.93 u[IU]/mL (ref 0.35–4.50)

## 2020-09-06 LAB — T4, FREE: Free T4: 0.91 ng/dL (ref 0.60–1.60)

## 2020-09-06 MED ORDER — PREDNISONE 20 MG PO TABS: ORAL_TABLET | ORAL | 0 refills | Status: DC

## 2020-09-06 MED ORDER — VITAMIN D3 25 MCG (1000 UT) PO CAPS: 1.0000 | ORAL_CAPSULE | Freq: Every day | ORAL | Status: DC

## 2020-09-06 NOTE — Assessment & Plan Note (Signed)
rec start oral replacement with OTC 1000 IU daily.

## 2020-09-06 NOTE — Assessment & Plan Note (Signed)
Continues monthly B12 injections as levels quickly dropped after previously stopping.

## 2020-09-06 NOTE — Progress Notes (Signed)
Patient ID: Nicholas [name], male    DOB: 10-19-1950, 69 y.o.   MRN: [id number]  This visit was conducted in person.  BP 118/72 (BP Location: Left Arm, Patient Position: Sitting, Cuff Size: Large)   Pulse (!) 54   Temp 97.7 F (36.5 C) (Temporal)   Ht 5' 9.5" (1.765 m)   Wt 267 lb (121.1 kg)   SpO2 96%   BMI 38.86 kg/m    CC: discuss MRI results  Subjective:   HPI: Nicholas [name] is a 69 y.o. male presenting on 09/06/2020 for Results (Wants to discuss recent MRI results.   Pt accompanied by daughter, Nicholas [name]- temp 97.6.)   Saw Dr Einar Pheasant last month for new and worsening bitemporal headaches for weeks. Workup led to MRI with/without contrast showing 72mm hypo-enhancing lesion at left pituitary likely representing microadenoma causing mild R deviation of pituitary stalk without suprasellar extension. MRI also showed possible chronic microvascular ischemic changes.  Describes 6 weeks of dull bitemporal headaches at its worse 6/10, currently 6/10. Constant headache for 6 weeks. No photo/phonophobia, nausea. It is activity limiting. No new meds or supplements. No changes over the past 6 wks identified to trigger headaches. Does have morning headaches.  Nothing has helped headaches. Tylenol 100mg  bid, gabapentin 300/600/600mg , elavil 100mg  nightly. Robaxin hasn't helped headache. Imitrex doesn't touch headache. Tylenol #3 can help.   Currently off vitamin D. He completed 5 weeks of 50k unit replacement.  Started vitamin B12 monthly injections over the summer. Doesn't find headache worsens with b12 replacement.   Denies vision changes, diplopia, CSF rhinorrhea, no hyper or hypothyroid symptoms, hypogonadal symptoms.   Recent eye exam ok.   Lab Results  Component Value Date   TSH 1.91 03/23/2020   T3TOTAL 141.0 09/26/2016  fT4 normal at 0.87 (03/2020)     Relevant past medical, surgical, family and social history reviewed and updated as indicated. Interim medical history since our last  visit reviewed. Allergies and medications reviewed and updated. Outpatient Medications Prior to Visit  Medication Sig Dispense Refill  . acetaminophen (TYLENOL) 500 MG tablet Take 1,000 mg by mouth 2 (two) times daily. IN THE AFTERNOON & AT NIGHT    . amitriptyline (ELAVIL) 50 MG tablet TAKE 1-2 TABLETS (50-100 MG TOTAL) BY MOUTH AT BEDTIME. 180 tablet 3  . atorvastatin (LIPITOR) 20 MG tablet TAKE 1 TABLET BY MOUTH EVERY DAY 90 tablet 1  . clotrimazole (LOTRIMIN) 1 % cream Apply 1 application topically 2 (two) times daily. To groin (Patient taking differently: Apply 1 application topically 3 (three) times daily. To groin) 60 g 1  . EPINEPHrine 0.3 mg/0.3 mL IJ SOAJ injection Inject 0.3 mLs (0.3 mg total) into the muscle as needed for anaphylaxis. (bee stings) 2 each 1  . ferrous sulfate 325 (65 FE) MG EC tablet Take 325 mg by mouth every other day.    . furosemide (LASIX) 20 MG tablet TAKE 1 TABLET BY MOUTH EVERY DAY AS NEEDED FOR FLUID OR EDEMA 90 tablet 1  . gabapentin (NEURONTIN) 300 MG capsule Take 1 capsule (300 mg total) by mouth in the morning AND 2 capsules (600 mg total) 2 (two) times daily. (lunch and dinner). 450 capsule 1  . ketoconazole (NIZORAL) 2 % shampoo Apply 1 application topically 3 (three) times a week. 120 mL 3  . levothyroxine (SYNTHROID) 100 MCG tablet TAKE 1 TABLET (100 MCG TOTAL) BY MOUTH DAILY BEFORE BREAKFAST. 90 tablet 2  . methocarbamol (ROBAXIN) 500 MG tablet Take 1 tablet (  500 mg total) by mouth every 8 (eight) hours as needed for muscle spasms. 40 tablet 0  . metoprolol succinate (TOPROL-XL) 100 MG 24 hr tablet TAKE 1 TABLET BY MOUTH EVERY DAY 90 tablet 3  . nitroGLYCERIN (NITROSTAT) 0.4 MG SL tablet Place 1 tablet (0.4 mg total) under the tongue every 5 (five) minutes as needed for chest pain. 20 tablet 3  . pantoprazole (PROTONIX) 40 MG tablet TAKE 1 TABLET BY MOUTH EVERY DAY 90 tablet 3  . SUMAtriptan (IMITREX) 100 MG tablet TAKE 1 TAB AS NEEDED FOR MIGRAINE  (REPEAT IN 2 HOURS IF NEEDED). LIMIT TO 2/24 HOUR PERIOD 12 tablet 0  . Vitamin D, Ergocalciferol, (DRISDOL) 1.25 MG (50000 UNIT) CAPS capsule TAKE 1 CAPSULE (50,000 UNITS TOTAL) BY MOUTH EVERY 7 (SEVEN) DAYS. 5 capsule 0   No facility-administered medications prior to visit.     Per HPI unless specifically indicated in ROS section below Review of Systems Objective:  BP 118/72 (BP Location: Left Arm, Patient Position: Sitting, Cuff Size: Large)   Pulse (!) 54   Temp 97.7 F (36.5 C) (Temporal)   Ht 5' 9.5" (1.765 m)   Wt 267 lb (121.1 kg)   SpO2 96%   BMI 38.86 kg/m   Wt Readings from Last 3 Encounters:  09/06/20 267 lb (121.1 kg)  07/27/20 266 lb 8 oz (120.9 kg)  06/15/20 269 lb 8 oz (122.2 kg)      Physical Exam Vitals and nursing note reviewed.  Constitutional:      Appearance: Normal appearance. He is not ill-appearing.  HENT:     Head: Normocephalic and atraumatic.     Mouth/Throat:     Mouth: Mucous membranes are dry.     Pharynx: Oropharynx is clear. No oropharyngeal exudate or posterior oropharyngeal erythema.  Eyes:     General:        Right eye: No discharge.        Left eye: No discharge.     Extraocular Movements: Extraocular movements intact.     Conjunctiva/sclera: Conjunctivae normal.     Pupils: Pupils are equal, round, and reactive to light.  Cardiovascular:     Rate and Rhythm: Normal rate and regular rhythm.     Pulses: Normal pulses.     Heart sounds: Normal heart sounds. No murmur heard.   Pulmonary:     Effort: Pulmonary effort is normal. No respiratory distress.     Breath sounds: Normal breath sounds. No wheezing, rhonchi or rales.  Musculoskeletal:     Comments: Chronic R knee fixed in extension  Skin:    General: Skin is warm and dry.     Findings: No rash.  Neurological:     Mental Status: He is alert.     Cranial Nerves: Cranial nerves are intact.     Sensory: Sensation is intact.     Motor: Motor function is intact.     Comments:    CN 2-12 intact FTN intact EOMI   Psychiatric:        Mood and Affect: Mood normal.        Behavior: Behavior normal.       Results for orders placed or performed in visit on 08/03/20  CBC  Result Value Ref Range   WBC 7.8 4.0 - 10.5 K/uL   RBC 4.73 4.22 - 5.81 Mil/uL   Platelets 213.0 150 - 400 K/uL   Hemoglobin 15.1 13.0 - 17.0 g/dL   HCT 44.7 39 - 52 %  MCV 94.6 78.0 - 100.0 fl   MCHC 33.9 30.0 - 36.0 g/dL   RDW 14.0 11.5 - 15.5 %  Comprehensive metabolic panel  Result Value Ref Range   Sodium 138 135 - 145 mEq/L   Potassium 4.3 3.5 - 5.1 mEq/L   Chloride 100 96 - 112 mEq/L   CO2 31 19 - 32 mEq/L   Glucose, Bld 103 (H) 70 - 99 mg/dL   BUN 18 6 - 23 mg/dL   Creatinine, Ser 1.26 0.40 - 1.50 mg/dL   Total Bilirubin 2.1 (H) 0.2 - 1.2 mg/dL   Alkaline Phosphatase 89 39 - 117 U/L   AST 21 0 - 37 U/L   ALT 16 0 - 53 U/L   Total Protein 6.7 6.0 - 8.3 g/dL   Albumin 4.1 3.5 - 5.2 g/dL   GFR 62.00 >60.00 mL/min   Calcium 8.8 8.4 - 10.5 mg/dL  Sedimentation rate  Result Value Ref Range   Sed Rate 18 0 - 20 mm/hr   MR Brain W Wo Contrast CLINICAL DATA:  Headache,.  EXAM: MRI HEAD WITHOUT AND WITH CONTRAST  TECHNIQUE: Multiplanar, multiecho pulse sequences of the brain and surrounding structures were obtained without and with intravenous contrast.  CONTRAST:  21mL MULTIHANCE GADOBENATE DIMEGLUMINE 529 MG/ML IV SOLN  COMPARISON:  Head CT April 26, 2013.  FINDINGS: Brain: No acute infarction, hemorrhage, hydrocephalus or extra-axial collection.  A few foci of T2 hyperintensity are seen within the white matter of the cerebral hemispheres, nonspecific.  A 10 mm hypoenhancing lesion within the left side of the pituitary gland likely representing a pituitary micro adenoma. Mild deviation of the pituitary stalk to the right. No suprasellar component. Although no dedicated images of the sellar region were obtained, the cavernous sinuses appear preserved. Cavernous  ICAs flow voids are maintained  Vascular: Normal flow voids.  Skull and upper cervical spine: Normal marrow signal.  Sinuses/Orbits: Bilateral lens surgery. Paranasal sinuses are clear.  Other: None.  IMPRESSION: 1. A 10 mm hypoenhancing lesion within the left side of the pituitary gland likely representing a pituitary micro adenoma. Mild deviation of the pituitary stalk to the right. No suprasellar component. 2. A few foci of T2 hyperintensity are seen within the white matter of the cerebral hemispheres, nonspecific but may be seen with chronic microvascular ischemic changes.  Electronically Signed   By: Pedro Earls M.D.   On: 08/31/2020 08:53   Assessment & Plan:  This visit occurred during the SARS-CoV-2 public health emergency.  Safety protocols were in place, including screening questions prior to the visit, additional usage of staff PPE, and extensive cleaning of exam room while observing appropriate contact time as indicated for disinfecting solutions.   Problem List Items Addressed This Visit    Vitamin D deficiency    rec start oral replacement with OTC 1000 IU daily.       Vitamin B12 deficiency    Continues monthly B12 injections as levels quickly dropped after previously stopping.       Pituitary adenoma ([place]) - Primary    Pursue hormonal evaluation given pituitary lesion 1cm or greater: will measure prolactin (lactotroph), IGF-1 (somatotroph), and plasma corticotropin and 24h urine free cortisol (corticotroph). Will also measure of LH, FSH (gonadotroph), and fT4 and TSH (thyrotroph).       Relevant Orders   Luteinizing hormone   Follicle Stimulating Hormone   Insulin-like growth factor   Prolactin   ACTH   Cortisol, Free 24 HR Urine  TSH   T4, free   Hypothyroidism    He continues levothyroxine 143mcg daily.  Update labs with new headache.       Chronic intractable headache    Headache could possibly come from pituitary  microadenoma noted on MRI (sellar expansion).  Will trial short prednisone taper for headache.  Will pursue hormonal evaluation (see below) and refer to neurology for further evaluation.           Meds ordered this encounter  Medications  . Cholecalciferol (VITAMIN D3) 25 MCG (1000 UT) CAPS    Sig: Take 1 capsule (1,000 Units total) by mouth daily.    Dispense:  30 capsule  . predniSONE (DELTASONE) 20 MG tablet    Sig: Take two tablets daily for 3 days followed by one tablet daily for 4 days    Dispense:  10 tablet    Refill:  0    Don't start until after completing 24 hour urine collection   Orders Placed This Encounter  Procedures  . Luteinizing hormone  . Follicle Stimulating Hormone  . Insulin-like growth factor  . Prolactin  . ACTH  . Cortisol, Free 24 HR Urine  . TSH  . T4, free    Patient Instructions  MRI showing small pituitary mass likely adenoma ?causing headaches.  Labs today for hormonal evaluation, depending on results we may refer you to hormone doctor and/or neurosurgeon.  In the interim, may try short prednisone taper, let me know effect.    Follow up plan: No follow-ups on file.  [name], MD

## 2020-09-06 NOTE — Assessment & Plan Note (Signed)
Headache could possibly come from pituitary microadenoma noted on MRI (sellar expansion).  Will trial short prednisone taper for headache.  Will pursue hormonal evaluation (see below) and refer to neurology for further evaluation.

## 2020-09-06 NOTE — Addendum Note (Signed)
Addended by: Cloyd Stagers on: 09/06/2020 11:23 AM   Modules accepted: Orders

## 2020-09-06 NOTE — Assessment & Plan Note (Addendum)
He continues levothyroxine 122mcg daily.  Update labs with new headache.

## 2020-09-06 NOTE — Assessment & Plan Note (Signed)
Pursue hormonal evaluation given pituitary lesion 1cm or greater: will measure prolactin (lactotroph), IGF-1 (somatotroph), and plasma corticotropin and 24h urine free cortisol (corticotroph). Will also measure of LH, FSH (gonadotroph), and fT4 and TSH (thyrotroph).

## 2020-09-06 NOTE — Patient Instructions (Addendum)
MRI showing small pituitary mass likely adenoma ?causing headaches.  Labs today for hormonal evaluation, depending on results we may refer you to hormone doctor and/or neurosurgeon.  In the interim, may try short prednisone taper, let me know effect.

## 2020-09-07 ENCOUNTER — Other Ambulatory Visit: Payer: Self-pay | Admitting: Family Medicine

## 2020-09-07 DIAGNOSIS — R519 Headache, unspecified: Secondary | ICD-10-CM

## 2020-09-07 DIAGNOSIS — G8929 Other chronic pain: Secondary | ICD-10-CM

## 2020-09-07 DIAGNOSIS — D352 Benign neoplasm of pituitary gland: Secondary | ICD-10-CM

## 2020-09-12 ENCOUNTER — Other Ambulatory Visit: Payer: Self-pay | Admitting: Family Medicine

## 2020-09-12 ENCOUNTER — Other Ambulatory Visit (INDEPENDENT_AMBULATORY_CARE_PROVIDER_SITE_OTHER): Payer: PPO

## 2020-09-12 DIAGNOSIS — D352 Benign neoplasm of pituitary gland: Secondary | ICD-10-CM | POA: Diagnosis not present

## 2020-09-12 LAB — EXTRA SPECIMEN

## 2020-09-12 LAB — PROLACTIN: Prolactin: 8.4 ng/mL (ref 2.0–18.0)

## 2020-09-12 LAB — INSULIN-LIKE GROWTH FACTOR
IGF-I, LC/MS: 69 ng/mL (ref 41–279)
Z-Score (Male): -0.9 SD (ref ?–2.0)

## 2020-09-12 LAB — ACTH: C206 ACTH: 26 pg/mL (ref 6–50)

## 2020-09-16 LAB — CORTISOL, FREE 24 HR URINE
CREATININE, URINE: 1.38 g/(24.h) (ref 0.50–2.15)
Cortisol (Ur), Free: 8.3 mcg/24 h (ref 4.0–50.0)
Cortisol, Free ratio to CRT: 6 mcg/g creat
Total Volume: 1500 mL

## 2020-09-21 ENCOUNTER — Ambulatory Visit: Payer: PPO | Admitting: Podiatry

## 2020-09-27 ENCOUNTER — Other Ambulatory Visit: Payer: Self-pay | Admitting: Family Medicine

## 2020-09-29 ENCOUNTER — Encounter: Payer: Self-pay | Admitting: Neurology

## 2020-09-29 ENCOUNTER — Other Ambulatory Visit: Payer: Self-pay | Admitting: Neurology

## 2020-09-29 ENCOUNTER — Ambulatory Visit: Payer: PPO | Admitting: Neurology

## 2020-09-29 VITALS — BP 112/77 | HR 68 | Ht 69.0 in | Wt 264.2 lb

## 2020-09-29 DIAGNOSIS — D352 Benign neoplasm of pituitary gland: Secondary | ICD-10-CM

## 2020-09-29 DIAGNOSIS — G43709 Chronic migraine without aura, not intractable, without status migrainosus: Secondary | ICD-10-CM | POA: Diagnosis not present

## 2020-09-29 MED ORDER — ONDANSETRON 4 MG PO TBDP: 4.0000 mg | ORAL_TABLET | Freq: Three times a day (TID) | ORAL | 6 refills | Status: DC | PRN

## 2020-09-29 MED ORDER — DIVALPROEX SODIUM ER 500 MG PO TB24: 500.0000 mg | ORAL_TABLET | Freq: Every day | ORAL | 11 refills | Status: DC

## 2020-09-29 MED ORDER — UBRELVY 50 MG PO TABS: 50.0000 mg | ORAL_TABLET | Freq: Every day | ORAL | 11 refills | Status: DC | PRN

## 2020-09-29 MED ORDER — TIZANIDINE HCL 4 MG PO TABS: 4.0000 mg | ORAL_TABLET | Freq: Four times a day (QID) | ORAL | 6 refills | Status: DC | PRN

## 2020-09-29 NOTE — Progress Notes (Signed)
Chief Complaint  Patient presents with  . New Patient (Initial Visit)    "I get headaches really bad" Back Room, Alone in room    HISTORICAL  Nicholas [name] is a 69 year old male, seen in request by his primary care physician Dr. [name] no frequent headaches, initial evaluation was on September 29, 2020.  I reviewed and summarized the referring note. hypothyroidism, on supplement,  HLD CHF CAD, Stroke in 2018, right side weakness Right knee replacement MRSA in 2009, surgery in 2010,  Left ulnar nerve transposition surgery  Bilateral shoulder replacement.  He reported a history of migraine headaches since 69 years old, his typical migraine is holoacranial severe pounding headache with associated light, noise sensitivity, nauseous, over the years, he was treated with different over-the-counter medications, and triptan treatment without significant improvement, in his 104s, he was put on different preventive medications as well, there was no significant change per patient  He reported a history of heart attack, stroke, also had a history of difficult right knee replacement, complicated by prolonged MRSA infection, require prolonged antibiotic treatment.  He now complains of 1 to twice migraine headache each week, bilateral temporal retro-orbital region, sharp, moderate severe pain, with associated light, noise sensitivity, nauseous, lasting 1 to few days, Imitrex 100 mg provides limited help,  I personally reviewed MRI of the brain with and without contrast in November 2021, 10 mm hypoenhancing lesion within the left side of the pituitary gland likely representing a pituitary micro adenoma. Mild deviation of the pituitary stalk to the right. No suprasellar component.  Mild supratentorium small vessel disease,  He denies auras with his migraine,   REVIEW OF SYSTEMS: Full 14 system review of systems performed and notable only for as above. All other review of systems were  negative.  ALLERGIES: Allergies  Allergen Reactions  . Peanut-Containing Drug Products Anaphylaxis and Dermatitis  . Yellow Jacket Venom [Bee Venom] Anaphylaxis and Other (See Comments)    Respiratory Distress  . Celebrex [Celecoxib] Other (See Comments)    BLACK STOOL?MELENA?  [name] Percocet [Oxycodone-Acetaminophen] Hives, Swelling and Other (See Comments)  . Latex Rash  . Nsaids Rash    HOME MEDICATIONS: Current Outpatient Medications  Medication Sig Dispense Refill  . acetaminophen (TYLENOL) 500 MG tablet Take 1,000 mg by mouth 2 (two) times daily. IN THE AFTERNOON & AT NIGHT    . amitriptyline (ELAVIL) 50 MG tablet TAKE 1-2 TABLETS (50-100 MG TOTAL) BY MOUTH AT BEDTIME. 180 tablet 3  . atorvastatin (LIPITOR) 20 MG tablet TAKE 1 TABLET BY MOUTH EVERY DAY 90 tablet 1  . Cholecalciferol (VITAMIN D3) 25 MCG (1000 UT) CAPS Take 1 capsule (1,000 Units total) by mouth daily. 30 capsule   . clotrimazole (LOTRIMIN) 1 % cream Apply 1 application topically 2 (two) times daily. To groin (Patient taking differently: Apply 1 application topically 3 (three) times daily. To groin) 60 g 1  . EPINEPHrine 0.3 mg/0.3 mL IJ SOAJ injection Inject 0.3 mLs (0.3 mg total) into the muscle as needed for anaphylaxis. (bee stings) 2 each 1  . ferrous sulfate 325 (65 FE) MG EC tablet Take 325 mg by mouth every other day.    . furosemide (LASIX) 20 MG tablet TAKE 1 TABLET BY MOUTH EVERY DAY AS NEEDED FOR FLUID OR EDEMA 90 tablet 1  . gabapentin (NEURONTIN) 300 MG capsule Take 1 capsule (300 mg total) by mouth in the morning AND 2 capsules (600 mg total) 2 (two) times daily. (lunch and dinner). [place]  capsule 1  . ketoconazole (NIZORAL) 2 % shampoo Apply 1 application topically 3 (three) times a week. 120 mL 3  . levothyroxine (SYNTHROID) 100 MCG tablet TAKE 1 TABLET (100 MCG TOTAL) BY MOUTH DAILY BEFORE BREAKFAST. 90 tablet 2  . methocarbamol (ROBAXIN) 500 MG tablet Take 1 tablet (500 mg total) by mouth every 8 (eight)  hours as needed for muscle spasms. 40 tablet 0  . metoprolol succinate (TOPROL-XL) 100 MG 24 hr tablet TAKE 1 TABLET BY MOUTH EVERY DAY 90 tablet 3  . nitroGLYCERIN (NITROSTAT) 0.4 MG SL tablet Place 1 tablet (0.4 mg total) under the tongue every 5 (five) minutes as needed for chest pain. 20 tablet 3  . pantoprazole (PROTONIX) 40 MG tablet TAKE 1 TABLET BY MOUTH EVERY DAY 90 tablet 3  . predniSONE (DELTASONE) 20 MG tablet Take two tablets daily for 3 days followed by one tablet daily for 4 days 10 tablet 0  . SUMAtriptan (IMITREX) 100 MG tablet TAKE 1 TAB AS NEEDED FOR MIGRAINE (REPEAT IN 2 HOURS IF NEEDED). LIMIT TO 2/24 HOUR PERIOD 12 tablet 0   No current facility-administered medications for this visit.    PAST MEDICAL HISTORY: Past Medical History:  Diagnosis Date  . Acute kidney failure 08/2008   "cleared up"  no problems since  . Allergy   . Anxiety   . Arthritis   . Asthma    ?of this no inhaler  . Blood transfusion   . CHF (congestive heart failure) ([place])    ?of this, pt denies  . CLOSTRIDIUM DIFFICILE COLITIS 07/04/2010   Annotation: 12/09, 2/10 Qualifier: Diagnosis of  By: [name], [name]    . Depression with anxiety   . Duodenitis determined by biopsy 02/2016   peptic likely due to aleve (erosive gastropathy with duodenal erosions)  . ECZEMA 07/04/2010   Qualifier: Diagnosis of  By: [name], [name]    . Elevated liver enzymes   . Full dentures   . GERD (gastroesophageal reflux disease)   . Heart attack ([place])    08/2008 (likely demand ischemia in the setting of MSSA sepsis/R TKA  infection)10-2008  . History of hiatal hernia   . History of stomach ulcers   . Hypothyroidism   . Lower GI bleeding   . MALAR AND MAXILLARY BONES CLOSED FRACTURE 07/04/2010   Annotation: ORIF Qualifier: Diagnosis of  By: [name], [name]    . Migraine    "definitely"  . MRSA (methicillin resistant Staphylococcus aureus)    in leg, had to place steel rod in leg  . Personal history of  colonic adenoma 06/01/2003  . PFO (patent foramen ovale)    small PFO by 08/2008 TEE  . Pneumonia 08/2008   "while in ICU"  . Seizures ([place]) 2009   "long time ago"    . Sleep apnea    does not wear CPAP  . Stroke [facility]) 2010   unable to complete sentences at times  . Wears glasses     PAST SURGICAL HISTORY: Past Surgical History:  Procedure Laterality Date  . anterior  nerve transposition  07/2009   left ulnar nerve  . antibiotic spacer exchange  11/2008; 08/2006   right knee  . ARTHROTOMY  08/2008   right knee w/I&D  . CARDIOVASCULAR STRESS TEST  2013   stress EKG - negative for ischemia, 4 min 7.3 METs, normal blood pressure response  . CATARACT EXTRACTION    . COLONOSCOPY  11/2012   diverticulosis, rpt 10 yrs (  [name])  . ESOPHAGOGASTRODUODENOSCOPY  02/2016   erosive gastropathy with duodenal erosions [name])  . HARDWARE REMOVAL  04/2006   right knee w/antibiotic spacers placed  . INGUINAL HERNIA REPAIR  early 1990's   bilateral  . JOINT REPLACEMENT Bilateral   . KNEE ARTHROSCOPY  10/2001   right  . KNEE FUSION  03/2009   right knee removal; antibiotic spacers;   . LEFT HEART CATH AND CORONARY ANGIOGRAPHY N/A 02/04/2018    WNL (Patwardhan, Reynold Bowen, MD)  . LUMBAR LAMINECTOMY/DECOMPRESSION MICRODISCECTOMY N/A 12/05/2018   Procedure: LUMBAR THREE TO FOUR AND LUMBAR FOUR TO FIVE DECOMPRESSION;  Surgeon: [name], MD;  Location: [place];  Service: Orthopedics;  Laterality: N/A;  . MULTIPLE TOOTH EXTRACTIONS    . REPLACEMENT TOTAL KNEE  08/2008; 09/2001   right  . REVERSE SHOULDER ARTHROPLASTY Left 09/03/2017   Procedure: REVERSE SHOULDER ARTHROPLASTY;  Surgeon: Meredith Pel, MD;  Location: [place];  Service: Orthopedics;  Laterality: Left;  . REVERSE SHOULDER ARTHROPLASTY Right 05/13/2018   Procedure: RIGHT REVERSE SHOULDER ARTHROPLASTY;  Surgeon: Meredith Pel, MD;  Location: [place];  Service: Orthopedics;  Laterality: Right;  . rod placement Right 03/2009   R  knee  . SYNOVECTOMY  06/2005   debridement, liner exchange right knee  . TOE AMPUTATION Left 08/2008   great toe - osteomyelitis (staph infection)  . TOE AMPUTATION Left 04/2016   2nd toe [name]  . TONSILLECTOMY    . TOTAL KNEE ARTHROPLASTY Left 01/15/2017  . TOTAL KNEE ARTHROPLASTY Left 01/15/2017   Procedure: TOTAL KNEE ARTHROPLASTY;  Surgeon: Meredith Pel, MD;  Location: [place];  Service: Orthopedics;  Laterality: Left;    FAMILY HISTORY: Family History  Problem Relation Age of Onset  . Coronary artery disease Father 69  . Stroke Father   . CAD Mother 74       stents  . Breast cancer Sister   . Diabetes Brother   . Diabetes Sister   . Colon cancer Neg Hx   . Stomach cancer Neg Hx     SOCIAL HISTORY: Social History   Socioeconomic History  . Marital status: Widowed    Spouse name: Not on file  . Number of children: 2  . Years of education: 27  . Highest education level: Not on file  Occupational History  . Occupation: retired  Tobacco Use  . Smoking status: Never Smoker  . Smokeless tobacco: Never Used  Vaping Use  . Vaping Use: Never used  Substance and Sexual Activity  . Alcohol use: No    Alcohol/week: 0.0 standard drinks    Comment: "I abused alcohol; last drink  ~ 2005"  . Drug use: No  . Sexual activity: Never  Other Topics Concern  . Not on file  Social History Narrative   Widower 2016 - wife passed from ESRD related illness   Lives in [place] in [place].    M[employer]on      Screened positive for OSA 04/2018   Social Determinants of Health   Financial Resource Strain: Low Risk   . Difficulty of Paying Living Expenses: Not hard at all  Food Insecurity: No Food Insecurity  . Worried About Charity fundraiser in the Last Year: Never true  . Ran Out of Food in the Last Year: Never true  Transportation Needs: No Transportation Needs  . Lack of Transportation (Medical): No  . Lack of Transportation (Non-Medical): No  Physical Activity:  Inactive  . Days  of Exercise per Week: 0 days  . Minutes of Exercise per Session: 0 min  Stress: No Stress Concern Present  . Feeling of Stress : Not at all  Social Connections: Not on file  Intimate Partner Violence: Not At Risk  . Fear of Current or Ex-Partner: No  . Emotionally Abused: No  . Physically Abused: No  . Sexually Abused: No     PHYSICAL EXAM   Vitals:   09/29/20 1341  BP: 112/77  Pulse: 68  Weight: 264 lb 3.2 oz (119.8 kg)  Height: 5\' 9"  (1.753 m)   Not recorded     Body mass index is 39.02 kg/m.  PHYSICAL EXAMNIATION:  Gen: NAD, conversant, well nourised, well groomed                     Cardiovascular: Regular rate rhythm, no peripheral edema, warm, nontender. Eyes: Conjunctivae clear without exudates or hemorrhage Neck: Supple, no carotid bruits. Pulmonary: Clear to auscultation bilaterally   NEUROLOGICAL EXAM:  MENTAL STATUS: Speech:    Speech is normal; fluent and spontaneous with normal comprehension.  Cognition:     Orientation to time, place and person     Normal recent and remote memory     Normal Attention span and concentration     Normal Language, naming, repeating,spontaneous speech     Fund of knowledge   CRANIAL NERVES: CN II: Visual fields are full to confrontation. Pupils are round equal and briskly reactive to light. CN III, IV, VI: extraocular movement are normal. No ptosis. CN V: Facial sensation is intact to light touch CN VII: Face is symmetric with normal eye closure  CN VIII: Hearing is normal to causal conversation. CN IX, X: Phonation is normal. CN XI: Head turning and shoulder shrug are intact  MOTOR: No weakness noted, has to keep right knee extended in sitting position  REFLEXES: Reflexes are 2+ and symmetric at the biceps, triceps, knees, and ankles. Plantar responses are flexor.  SENSORY: Intact to light touch, pinprick and vibratory sensation are intact in fingers and toes.  COORDINATION: There is no  trunk or limb dysmetria noted.  GAIT/STANCE: Push-up to get up from seated position, dragging right leg, antalgic   DIAGNOSTIC DATA (LABS, IMAGING, TESTING) - I reviewed patient records, labs, notes, testing and imaging myself where available.   ASSESSMENT AND PLAN  Nicholas [name] is a 69 y.o. male   Chronic migraine headache  MRI of brain showed 10 mm hypodensity pituitary microadenoma, less likely contributed to his lifelong history of migraine headaches,  Is not a good candidate for triptan treatment due to reported history of coronary artery disease, and stroke, aging, vascular risk factors,  Ubrelvy 50 mg as needed for abortive treatment,  Continue metoprolol, gabapentin as preventive medication, also add on Depakote ER 500 mg every night as preventive medications, the other options are CGRP antagonist or Botox injection if he fails above.   Marcial Pacas, M.D. Ph.D.  [facility] [address] [place], [place] [address] Ph: [phone] Fax: [phone]  CC:  [name], MD [address] [place],  [place] [address]

## 2020-10-04 ENCOUNTER — Telehealth: Payer: Self-pay | Admitting: Family Medicine

## 2020-10-04 NOTE — Telephone Encounter (Signed)
Looks like Dr Krista Blue refilled zofran last week with 6 refills available.

## 2020-10-04 NOTE — Telephone Encounter (Signed)
Patient called in stating he was told by Dr.Yan at [facility] to ask our office for zofran. States they are going to put him on 3 medications and this was the only med needed from [place]. Please advise.

## 2020-10-05 NOTE — Telephone Encounter (Signed)
Spoke with pt relaying Dr. G's message. Pt verbalizes understanding.  

## 2020-11-14 ENCOUNTER — Telehealth: Payer: Self-pay | Admitting: *Deleted

## 2020-11-14 NOTE — Telephone Encounter (Signed)
Patient called stating that he has been using a cane and feels that he needs a walker. Patient stated that he has been falling a lot. Patient stated that he is sure that his insurance plan will cover a walker. Patient will check with his insurance company and find out what medical supply company they use. Patient stated that he will call back with this information.

## 2020-11-16 NOTE — Telephone Encounter (Signed)
Patient called back and advised that he spoke to his insurance company and was advised that they will fax the information over to Dr. Danise Mina regarding the company that they use for medical supplies. Patient requested that our office keep an eye out on the information from his insurance company regarding the walker.

## 2020-11-16 NOTE — Telephone Encounter (Signed)
Noted  

## 2020-11-16 NOTE — Telephone Encounter (Signed)
Tried to call patient to follow-up on his call yesterday. Left a message on voicemail for patient to call back.

## 2020-11-28 NOTE — Telephone Encounter (Signed)
I never received paperwork.  I have placed a written order for rolling walker in Lisa's box.

## 2020-11-28 NOTE — Telephone Encounter (Signed)
I haven't seen anything ppw for pt.  Dr. Darnell Level, have you?

## 2020-11-28 NOTE — Telephone Encounter (Signed)
Patient called stating that he is still trying to get a walker. Patient was reminded of our conversation last week. Patient wants to know if Dr. Danise Mina ever got the paperwork from his insurance company. Patient requested a call back to let him know if paperwork has been received.

## 2020-11-29 NOTE — Telephone Encounter (Signed)
Pt left v/m that form was for wrong thing. Pt needs walker and pt ask for cb on 11/30/20 when  He has the name and phone # of where to get the walker from. Sending note to [facility].

## 2020-11-29 NOTE — Telephone Encounter (Signed)
Pt returning call.  Says he will pick up the order.  [Placed written order at front office- blue folders.]

## 2020-11-29 NOTE — Telephone Encounter (Signed)
Lvm asking pt to call back.  Need to know if pt wants to pick up written walker order or does he want it mailed to him to take to a medical supply location.  [Written order in Copy on [employer].]

## 2020-11-30 NOTE — Telephone Encounter (Signed)
Received fax from [facility] requesting order be faxed to them at [phone], per pt request.

## 2020-11-30 NOTE — Telephone Encounter (Signed)
Faxed order

## 2020-11-30 NOTE — Telephone Encounter (Addendum)
Lvm asking pt to call back.  The order Dr. Darnell Level from Dr. Darnell Level is for a rolling walker.  Is that not what pt wants?  [Placed written order at front office- blue folders.]

## 2020-12-02 NOTE — Telephone Encounter (Signed)
Faxed form.

## 2020-12-02 NOTE — Telephone Encounter (Signed)
Received faxed Confirmation of Order and Mobility Assessment form from [facility] to be completed and faxed.  Placed form in Dr. Synthia Innocent box.

## 2020-12-02 NOTE — Telephone Encounter (Signed)
Filled and in Lisa's box 

## 2020-12-03 ENCOUNTER — Other Ambulatory Visit: Payer: Self-pay | Admitting: Family Medicine

## 2020-12-03 DIAGNOSIS — Z125 Encounter for screening for malignant neoplasm of prostate: Secondary | ICD-10-CM

## 2020-12-03 DIAGNOSIS — E611 Iron deficiency: Secondary | ICD-10-CM

## 2020-12-03 DIAGNOSIS — E559 Vitamin D deficiency, unspecified: Secondary | ICD-10-CM

## 2020-12-03 DIAGNOSIS — E785 Hyperlipidemia, unspecified: Secondary | ICD-10-CM

## 2020-12-03 DIAGNOSIS — E538 Deficiency of other specified B group vitamins: Secondary | ICD-10-CM

## 2020-12-03 DIAGNOSIS — N289 Disorder of kidney and ureter, unspecified: Secondary | ICD-10-CM

## 2020-12-03 DIAGNOSIS — E039 Hypothyroidism, unspecified: Secondary | ICD-10-CM

## 2020-12-07 ENCOUNTER — Telehealth: Payer: Self-pay

## 2020-12-07 ENCOUNTER — Other Ambulatory Visit: Payer: Medicare Other

## 2020-12-07 NOTE — Telephone Encounter (Signed)
Per pt appt notes the appt in question has already been changed by [facility]; nothing further needed

## 2020-12-07 NOTE — Telephone Encounter (Signed)
Pt left v/m that he had a lab appt 12/07/20 at 10 AM and it is almost 11 AM and pt request cb to reschedule CPX lab appt.

## 2020-12-08 ENCOUNTER — Other Ambulatory Visit: Payer: Self-pay

## 2020-12-08 ENCOUNTER — Other Ambulatory Visit (INDEPENDENT_AMBULATORY_CARE_PROVIDER_SITE_OTHER): Payer: Medicare Other

## 2020-12-08 DIAGNOSIS — E538 Deficiency of other specified B group vitamins: Secondary | ICD-10-CM

## 2020-12-08 DIAGNOSIS — Z125 Encounter for screening for malignant neoplasm of prostate: Secondary | ICD-10-CM | POA: Diagnosis not present

## 2020-12-08 DIAGNOSIS — E559 Vitamin D deficiency, unspecified: Secondary | ICD-10-CM | POA: Diagnosis not present

## 2020-12-08 DIAGNOSIS — M48061 Spinal stenosis, lumbar region without neurogenic claudication: Secondary | ICD-10-CM | POA: Diagnosis not present

## 2020-12-08 DIAGNOSIS — N289 Disorder of kidney and ureter, unspecified: Secondary | ICD-10-CM | POA: Diagnosis not present

## 2020-12-08 DIAGNOSIS — M79606 Pain in leg, unspecified: Secondary | ICD-10-CM | POA: Diagnosis not present

## 2020-12-08 DIAGNOSIS — E039 Hypothyroidism, unspecified: Secondary | ICD-10-CM | POA: Diagnosis not present

## 2020-12-08 DIAGNOSIS — E611 Iron deficiency: Secondary | ICD-10-CM

## 2020-12-08 DIAGNOSIS — E785 Hyperlipidemia, unspecified: Secondary | ICD-10-CM | POA: Diagnosis not present

## 2020-12-08 DIAGNOSIS — M21969 Unspecified acquired deformity of unspecified lower leg: Secondary | ICD-10-CM | POA: Diagnosis not present

## 2020-12-08 DIAGNOSIS — Z9889 Other specified postprocedural states: Secondary | ICD-10-CM | POA: Diagnosis not present

## 2020-12-09 LAB — CBC WITH DIFFERENTIAL/PLATELET
Basophils Absolute: 0 10*3/uL (ref 0.0–0.1)
Basophils Relative: 0.6 % (ref 0.0–3.0)
Eosinophils Absolute: 0.3 10*3/uL (ref 0.0–0.7)
Eosinophils Relative: 3.6 % (ref 0.0–5.0)
HCT: 44.2 % (ref 39.0–52.0)
Hemoglobin: 15.1 g/dL (ref 13.0–17.0)
Lymphocytes Relative: 19.6 % (ref 12.0–46.0)
Lymphs Abs: 1.6 10*3/uL (ref 0.7–4.0)
MCHC: 34.1 g/dL (ref 30.0–36.0)
MCV: 96 fl (ref 78.0–100.0)
Monocytes Absolute: 1.1 10*3/uL — ABNORMAL HIGH (ref 0.1–1.0)
Monocytes Relative: 12.7 % — ABNORMAL HIGH (ref 3.0–12.0)
Neutro Abs: 5.3 10*3/uL (ref 1.4–7.7)
Neutrophils Relative %: 63.5 % (ref 43.0–77.0)
Platelets: 222 10*3/uL (ref 150.0–400.0)
RBC: 4.61 Mil/uL (ref 4.22–5.81)
RDW: 14.7 % (ref 11.5–15.5)
WBC: 8.3 10*3/uL (ref 4.0–10.5)

## 2020-12-09 LAB — VITAMIN D 25 HYDROXY (VIT D DEFICIENCY, FRACTURES): VITD: 25.83 ng/mL — ABNORMAL LOW (ref 30.00–100.00)

## 2020-12-09 LAB — LIPID PANEL
Cholesterol: 137 mg/dL (ref 0–200)
HDL: 42.3 mg/dL (ref >39.00)
LDL Cholesterol: 59 mg/dL (ref 0–99)
NonHDL: 94.33
Total CHOL/HDL Ratio: 3
Triglycerides: 176 mg/dL — ABNORMAL HIGH (ref 0.0–149.0)
VLDL: 35.2 mg/dL (ref 0.0–40.0)

## 2020-12-09 LAB — COMPREHENSIVE METABOLIC PANEL
ALT: 12 U/L (ref 0–53)
AST: 16 U/L (ref 0–37)
Albumin: 4 g/dL (ref 3.5–5.2)
Alkaline Phosphatase: 86 U/L (ref 39–117)
BUN: 25 mg/dL — ABNORMAL HIGH (ref 6–23)
CO2: 32 mEq/L (ref 19–32)
Calcium: 8.6 mg/dL (ref 8.4–10.5)
Chloride: 100 mEq/L (ref 96–112)
Creatinine, Ser: 1.34 mg/dL (ref 0.40–1.50)
GFR: 53.84 mL/min — ABNORMAL LOW (ref >60.00)
Glucose, Bld: 102 mg/dL — ABNORMAL HIGH (ref 70–99)
Potassium: 4 mEq/L (ref 3.5–5.1)
Sodium: 141 mEq/L (ref 135–145)
Total Bilirubin: 0.9 mg/dL (ref 0.2–1.2)
Total Protein: 6.8 g/dL (ref 6.0–8.3)

## 2020-12-09 LAB — IBC PANEL
Iron: 63 ug/dL (ref 42–165)
Saturation Ratios: 19.8 % — ABNORMAL LOW (ref 20.0–50.0)
Transferrin: 227 mg/dL (ref 212.0–360.0)

## 2020-12-09 LAB — PSA: PSA: 1.43 ng/mL (ref 0.10–4.00)

## 2020-12-09 LAB — VITAMIN B12: Vitamin B-12: 306 pg/mL (ref 211–911)

## 2020-12-09 LAB — TSH: TSH: 3.22 u[IU]/mL (ref 0.35–4.50)

## 2020-12-09 LAB — FERRITIN: Ferritin: 53.8 ng/mL (ref 22.0–322.0)

## 2020-12-14 ENCOUNTER — Encounter: Payer: PPO | Admitting: Family Medicine

## 2020-12-16 ENCOUNTER — Telehealth: Payer: Self-pay

## 2020-12-16 NOTE — Telephone Encounter (Signed)
Pt left v/m returning a missed call and request cb to see what call was about; sending note to [facility] and [facility]

## 2020-12-20 NOTE — Telephone Encounter (Signed)
I don't see any results or messages indicating someone from our office called.  It may have been a reminder call about tomorrow's OV.

## 2020-12-21 ENCOUNTER — Other Ambulatory Visit: Payer: Self-pay

## 2020-12-21 ENCOUNTER — Ambulatory Visit (INDEPENDENT_AMBULATORY_CARE_PROVIDER_SITE_OTHER): Payer: Medicare Other | Admitting: Family Medicine

## 2020-12-21 ENCOUNTER — Encounter: Payer: Self-pay | Admitting: Family Medicine

## 2020-12-21 VITALS — BP 132/76 | HR 64 | Temp 97.7°F | Ht 69.0 in | Wt 267.2 lb

## 2020-12-21 DIAGNOSIS — R21 Rash and other nonspecific skin eruption: Secondary | ICD-10-CM

## 2020-12-21 MED ORDER — TRIAMCINOLONE ACETONIDE 0.1 % EX CREA: 1.0000 "application " | TOPICAL_CREAM | Freq: Two times a day (BID) | CUTANEOUS | 0 refills | Status: AC

## 2020-12-21 NOTE — Progress Notes (Signed)
Patient ID: Nicholas [name], male    DOB: 04/20/1951, 69 y.o.   MRN: [id number]  This visit was conducted in person.  BP 132/76   Pulse 64   Temp 97.7 F (36.5 C) (Temporal)   Ht 5\' 9"  (1.753 m)   Wt 267 lb 3 oz (121.2 kg)   SpO2 92%   BMI 39.46 kg/m    CC: rash on hands  Subjective:   HPI: Nicholas [name] is a 70 y.o. male presenting on 12/21/2020 for Rash (C/o itchy rash on bilateral hands.  Started about 3 wks ago.  Also, has area in left knee.  )   3 wk h/o rash to bilateral dorsal hands over proximal thumbs - rash is itchy. Has had similar rash previously - told eczema. Also has noted rash to left anterior leg below knee - this is tender. Denies inciting trauma/injury or falls. No new lotions, detergents, soaps or shampoos. No new medicines besides depakote (see below).   Saw neurology for chronic migraine - pituitary microadenoma likely not contributing. Rx ubrelvy 50mg  abortively, start depakote ER 500mg  preventatively, planned f/u later this month.      Relevant past medical, surgical, family and social history reviewed and updated as indicated. Interim medical history since our last visit reviewed. Allergies and medications reviewed and updated. Outpatient Medications Prior to Visit  Medication Sig Dispense Refill  . acetaminophen (TYLENOL) 500 MG tablet Take 1,000 mg by mouth 2 (two) times daily. IN THE AFTERNOON & AT NIGHT    . amitriptyline (ELAVIL) 50 MG tablet TAKE 1-2 TABLETS (50-100 MG TOTAL) BY MOUTH AT BEDTIME. 180 tablet 3  . atorvastatin (LIPITOR) 20 MG tablet TAKE 1 TABLET BY MOUTH EVERY DAY 90 tablet 1  . Cholecalciferol (VITAMIN D3) 25 MCG (1000 UT) CAPS Take 1 capsule (1,000 Units total) by mouth daily. 30 capsule   . clotrimazole (LOTRIMIN) 1 % cream Apply 1 application topically 2 (two) times daily. To groin (Patient taking differently: Apply 1 application topically 3 (three) times daily. To groin) 60 g 1  . divalproex (DEPAKOTE ER) 500 MG 24 hr tablet Take  1 tablet (500 mg total) by mouth at bedtime. 30 tablet 11  . EPINEPHrine 0.3 mg/0.3 mL IJ SOAJ injection Inject 0.3 mLs (0.3 mg total) into the muscle as needed for anaphylaxis. (bee stings) 2 each 1  . ferrous sulfate 325 (65 FE) MG EC tablet Take 325 mg by mouth every other day.    . furosemide (LASIX) 20 MG tablet TAKE 1 TABLET BY MOUTH EVERY DAY AS NEEDED FOR FLUID OR EDEMA 90 tablet 1  . gabapentin (NEURONTIN) 300 MG capsule Take 1 capsule (300 mg total) by mouth in the morning AND 2 capsules (600 mg total) 2 (two) times daily. (lunch and dinner). 450 capsule 1  . ketoconazole (NIZORAL) 2 % shampoo Apply 1 application topically 3 (three) times a week. 120 mL 3  . levothyroxine (SYNTHROID) 100 MCG tablet TAKE 1 TABLET (100 MCG TOTAL) BY MOUTH DAILY BEFORE BREAKFAST. 90 tablet 2  . methocarbamol (ROBAXIN) 500 MG tablet Take 1 tablet (500 mg total) by mouth every 8 (eight) hours as needed for muscle spasms. 40 tablet 0  . metoprolol succinate (TOPROL-XL) 100 MG 24 hr tablet TAKE 1 TABLET BY MOUTH EVERY DAY 90 tablet 3  . nitroGLYCERIN (NITROSTAT) 0.4 MG SL tablet Place 1 tablet (0.4 mg total) under the tongue every 5 (five) minutes as needed for chest pain. 20 tablet 3  .  ondansetron (ZOFRAN ODT) 4 MG disintegrating tablet Take 1 tablet (4 mg total) by mouth every 8 (eight) hours as needed. 20 tablet 6  . pantoprazole (PROTONIX) 40 MG tablet TAKE 1 TABLET BY MOUTH EVERY DAY 90 tablet 3  . SUMAtriptan (IMITREX) 100 MG tablet TAKE 1 TAB AS NEEDED FOR MIGRAINE (REPEAT IN 2 HOURS IF NEEDED). LIMIT TO 2/24 HOUR PERIOD 12 tablet 0  . tiZANidine (ZANAFLEX) 4 MG tablet Take 1 tablet (4 mg total) by mouth every 6 (six) hours as needed for muscle spasms. 30 tablet 6  . Ubrogepant (UBRELVY) 50 MG TABS Take 50 mg by mouth daily as needed. 12 tablet 11  . predniSONE (DELTASONE) 20 MG tablet Take two tablets daily for 3 days followed by one tablet daily for 4 days 10 tablet 0   No facility-administered  medications prior to visit.     Per HPI unless specifically indicated in ROS section below Review of Systems Objective:  BP 132/76   Pulse 64   Temp 97.7 F (36.5 C) (Temporal)   Ht 5\' 9"  (1.753 m)   Wt 267 lb 3 oz (121.2 kg)   SpO2 92%   BMI 39.46 kg/m   Wt Readings from Last 3 Encounters:  12/21/20 267 lb 3 oz (121.2 kg)  09/29/20 264 lb 3.2 oz (119.8 kg)  09/06/20 267 lb (121.1 kg)      Physical Exam Vitals and nursing note reviewed.  Constitutional:      Appearance: Normal appearance. He is not ill-appearing.  Skin:    General: Skin is warm and dry.     Findings: Erythema and rash present. Rash is scaling.     Comments:  Bilateral dorsal hands into wrists with erythematous patches of scaling rash, pruritic  Left anterior leg below the kne with scabbing possible vesicular rash, some in linear distribution   Neurological:     Mental Status: He is alert.              Assessment & Plan:  This visit occurred during the SARS-CoV-2 public health emergency.  Safety protocols were in place, including screening questions prior to the visit, additional usage of staff PPE, and extensive cleaning of exam room while observing appropriate contact time as indicated for disinfecting solutions.   Problem List Items Addressed This Visit    Skin rash - Primary    2 separate lesions identified today:  1. L anterior leg below knee - seems like healing abrasion vs contact dermatitis type rash, but he denies recent injury or exposures. Anticipate this should improve well over time.  2. Pruritic scaly patches on both hands anticipate eczematoid rash - Rx triamcinolone cream BID x1-2 wks then stop. Update if worsening.  Newest medication is depakote, consider drug rash to this. If worsening would consider trial off depakote - he will touch base with neurology at upcoming appointment.           Meds ordered this encounter  Medications  . triamcinolone (KENALOG) 0.1 %    Sig:  Apply 1 application topically 2 (two) times daily. Apply to AA.    Dispense:  80 g    Refill:  0   No orders of the defined types were placed in this encounter.   Patient Instructions  For hand rash - use triamcinolone cream twice daily for 1-2 weeks max. If worsening/spreading, let us know. This could be eczema or possibly come from depakote. We will monitor left knee rash - let us know  if not improving each day.  Keep neurology follow up visit, check with them about depakote and rash.    Follow up plan: Return if symptoms worsen or fail to improve.  [name], MD

## 2020-12-21 NOTE — Patient Instructions (Addendum)
For hand rash - use triamcinolone cream twice daily for 1-2 weeks max. If worsening/spreading, let us know. This could be eczema or possibly come from depakote. We will monitor left knee rash - let us know if not improving each day.  Keep neurology follow up visit, check with them about depakote and rash.

## 2020-12-25 NOTE — Assessment & Plan Note (Addendum)
2 separate lesions identified today:  1. L anterior leg below knee - seems like healing abrasion vs contact dermatitis type rash, but he denies recent injury or exposures. Anticipate this should improve well over time.  2. Pruritic scaly patches on both hands anticipate eczematoid rash - Rx triamcinolone cream BID x1-2 wks then stop. Update if worsening.  Newest medication is depakote, consider drug rash to this. If worsening would consider trial off depakote - he will touch base with neurology at upcoming appointment.

## 2021-01-05 ENCOUNTER — Encounter: Payer: Self-pay | Admitting: Neurology

## 2021-01-05 ENCOUNTER — Ambulatory Visit (INDEPENDENT_AMBULATORY_CARE_PROVIDER_SITE_OTHER): Payer: Medicare Other | Admitting: Neurology

## 2021-01-05 VITALS — BP 129/80 | HR 68 | Ht 69.0 in | Wt 261.0 lb

## 2021-01-05 DIAGNOSIS — G8929 Other chronic pain: Secondary | ICD-10-CM | POA: Diagnosis not present

## 2021-01-05 DIAGNOSIS — R519 Headache, unspecified: Secondary | ICD-10-CM

## 2021-01-05 MED ORDER — DIVALPROEX SODIUM ER 500 MG PO TB24
500.0000 mg | ORAL_TABLET | Freq: Every day | ORAL | 3 refills | Status: DC
Start: 2021-01-05 — End: 2021-09-25

## 2021-01-05 NOTE — Progress Notes (Signed)
Chief Complaint  Patient presents with  . Follow-up    Rm 15, alone, states migraines have improved     HISTORICAL  Nicholas [name] is a 70 year old male, seen in request by his primary care physician Dr. [name] no frequent headaches, initial evaluation was on September 29, 2020.  I reviewed and summarized the referring note. hypothyroidism, on supplement,  HLD CHF CAD, Stroke in 2018, right side weakness Right knee replacement MRSA in 2009, surgery in 2010,  Left ulnar nerve transposition surgery  Bilateral shoulder replacement.  He reported a history of migraine headaches since 70 years old, his typical migraine is holoacranial severe pounding headache with associated light, noise sensitivity, nauseous, over the years, he was treated with different over-the-counter medications, and triptan treatment without significant improvement, in his 52s, he was put on different preventive medications as well, there was no significant change per patient  He reported a history of heart attack, stroke, also had a history of difficult right knee replacement, complicated by prolonged MRSA infection, require prolonged antibiotic treatment.  He now complains of 1 to twice migraine headache each week, bilateral temporal retro-orbital region, sharp, moderate severe pain, with associated light, noise sensitivity, nauseous, lasting 1 to few days, Imitrex 100 mg provides limited help,  I personally reviewed MRI of the brain with and without contrast in November 2021, 10 mm hypoenhancing lesion within the left side of the pituitary gland likely representing a pituitary micro adenoma. Mild deviation of the pituitary stalk to the right. No suprasellar component.  Mild supratentorium small vessel disease,  He denies auras with his migraine  Update January 05, 2021 SS: Here today alone, lives in senior living apartment. Added Depakote ER 500 mg at bedtime last time, remains on gabapentin, metoprolol.  Migraines used to be 1-2 a week, now 1 every 2-3 weeks. The Roselyn Meier works quickly, takes Zofran, tizanidine.  Labs reviewed from PCP February 2022, CMP looked okay.   REVIEW OF SYSTEMS: Full 14 system review of systems performed and notable only for as above.  See HPI  ALLERGIES: Allergies  Allergen Reactions  . Peanut-Containing Drug Products Anaphylaxis and Dermatitis  . Yellow Jacket Venom [Bee Venom] Anaphylaxis and Other (See Comments)    Respiratory Distress  . Celebrex [Celecoxib] Other (See Comments)    BLACK STOOL?MELENA?  [name] Percocet [Oxycodone-Acetaminophen] Hives, Swelling and Other (See Comments)  . Latex Rash  . Nsaids Rash    HOME MEDICATIONS: Current Outpatient Medications  Medication Sig Dispense Refill  . acetaminophen (TYLENOL) 500 MG tablet Take 1,000 mg by mouth 2 (two) times daily. IN THE AFTERNOON & AT NIGHT    . amitriptyline (ELAVIL) 50 MG tablet TAKE 1-2 TABLETS (50-100 MG TOTAL) BY MOUTH AT BEDTIME. 180 tablet 3  . atorvastatin (LIPITOR) 20 MG tablet TAKE 1 TABLET BY MOUTH EVERY DAY 90 tablet 1  . Cholecalciferol (VITAMIN D3) 25 MCG (1000 UT) CAPS Take 1 capsule (1,000 Units total) by mouth daily. 30 capsule   . clotrimazole (LOTRIMIN) 1 % cream Apply 1 application topically 2 (two) times daily. To groin (Patient taking differently: Apply 1 application topically 3 (three) times daily. To groin) 60 g 1  . divalproex (DEPAKOTE ER) 500 MG 24 hr tablet Take 1 tablet (500 mg total) by mouth at bedtime. 30 tablet 11  . EPINEPHrine 0.3 mg/0.3 mL IJ SOAJ injection Inject 0.3 mLs (0.3 mg total) into the muscle as needed for anaphylaxis. (bee stings) 2 each 1  . ferrous sulfate 325 (  65 FE) MG EC tablet Take 325 mg by mouth every other day.    . furosemide (LASIX) 20 MG tablet TAKE 1 TABLET BY MOUTH EVERY DAY AS NEEDED FOR FLUID OR EDEMA 90 tablet 1  . gabapentin (NEURONTIN) 300 MG capsule Take 1 capsule (300 mg total) by mouth in the morning AND 2 capsules (600 mg  total) 2 (two) times daily. (lunch and dinner). 450 capsule 1  . ketoconazole (NIZORAL) 2 % shampoo Apply 1 application topically 3 (three) times a week. 120 mL 3  . levothyroxine (SYNTHROID) 100 MCG tablet TAKE 1 TABLET (100 MCG TOTAL) BY MOUTH DAILY BEFORE BREAKFAST. 90 tablet 2  . methocarbamol (ROBAXIN) 500 MG tablet Take 1 tablet (500 mg total) by mouth every 8 (eight) hours as needed for muscle spasms. 40 tablet 0  . metoprolol succinate (TOPROL-XL) 100 MG 24 hr tablet TAKE 1 TABLET BY MOUTH EVERY DAY 90 tablet 3  . nitroGLYCERIN (NITROSTAT) 0.4 MG SL tablet Place 1 tablet (0.4 mg total) under the tongue every 5 (five) minutes as needed for chest pain. 20 tablet 3  . ondansetron (ZOFRAN ODT) 4 MG disintegrating tablet Take 1 tablet (4 mg total) by mouth every 8 (eight) hours as needed. 20 tablet 6  . pantoprazole (PROTONIX) 40 MG tablet TAKE 1 TABLET BY MOUTH EVERY DAY 90 tablet 3  . SUMAtriptan (IMITREX) 100 MG tablet TAKE 1 TAB AS NEEDED FOR MIGRAINE (REPEAT IN 2 HOURS IF NEEDED). LIMIT TO 2/24 HOUR PERIOD 12 tablet 0  . tiZANidine (ZANAFLEX) 4 MG tablet Take 1 tablet (4 mg total) by mouth every 6 (six) hours as needed for muscle spasms. 30 tablet 6  . triamcinolone (KENALOG) 0.1 % Apply 1 application topically 2 (two) times daily. Apply to AA. 80 g 0  . Ubrogepant (UBRELVY) 50 MG TABS Take 50 mg by mouth daily as needed. 12 tablet 11   No current facility-administered medications for this visit.    PAST MEDICAL HISTORY: Past Medical History:  Diagnosis Date  . Acute kidney failure 08/2008   "cleared up"  no problems since  . Allergy   . Anxiety   . Arthritis   . Asthma    ?of this no inhaler  . Blood transfusion   . CHF (congestive heart failure) ([place])    ?of this, pt denies  . CLOSTRIDIUM DIFFICILE COLITIS 07/04/2010   Annotation: 12/09, 2/10 Qualifier: Diagnosis of  By: [name], [name]    . Depression with anxiety   . Duodenitis determined by biopsy 02/2016   peptic likely  due to aleve (erosive gastropathy with duodenal erosions)  . ECZEMA 07/04/2010   Qualifier: Diagnosis of  By: [name], [name]    . Elevated liver enzymes   . Full dentures   . GERD (gastroesophageal reflux disease)   . Heart attack ([place])    08/2008 (likely demand ischemia in the setting of MSSA sepsis/R TKA  infection)10-2008  . History of hiatal hernia   . History of stomach ulcers   . Hypothyroidism   . Lower GI bleeding   . MALAR AND MAXILLARY BONES CLOSED FRACTURE 07/04/2010   Annotation: ORIF Qualifier: Diagnosis of  By: [name], [name]    . Migraine    "definitely"  . MRSA (methicillin resistant Staphylococcus aureus)    in leg, had to place steel rod in leg  . Personal history of colonic adenoma 06/01/2003  . PFO (patent foramen ovale)    small PFO by 08/2008 TEE  .  Pneumonia 08/2008   "while in ICU"  . Seizures ([place]) 2009   "long time ago"    . Sleep apnea    does not wear CPAP  . Stroke [facility]) 2010   unable to complete sentences at times  . Wears glasses     PAST SURGICAL HISTORY: Past Surgical History:  Procedure Laterality Date  . anterior  nerve transposition  07/2009   left ulnar nerve  . antibiotic spacer exchange  11/2008; 08/2006   right knee  . ARTHROTOMY  08/2008   right knee w/I&D  . CARDIOVASCULAR STRESS TEST  2013   stress EKG - negative for ischemia, 4 min 7.3 METs, normal blood pressure response  . CATARACT EXTRACTION    . COLONOSCOPY  11/2012   diverticulosis, rpt 10 yrs [name])  . ESOPHAGOGASTRODUODENOSCOPY  02/2016   erosive gastropathy with duodenal erosions [name])  . HARDWARE REMOVAL  04/2006   right knee w/antibiotic spacers placed  . INGUINAL HERNIA REPAIR  early 1990's   bilateral  . JOINT REPLACEMENT Bilateral   . KNEE ARTHROSCOPY  10/2001   right  . KNEE FUSION  03/2009   right knee removal; antibiotic spacers;   . LEFT HEART CATH AND CORONARY ANGIOGRAPHY N/A 02/04/2018    WNL (Patwardhan, Reynold Bowen, MD)  . LUMBAR  LAMINECTOMY/DECOMPRESSION MICRODISCECTOMY N/A 12/05/2018   Procedure: LUMBAR THREE TO FOUR AND LUMBAR FOUR TO FIVE DECOMPRESSION;  Surgeon: [name], MD;  Location: [place];  Service: Orthopedics;  Laterality: N/A;  . MULTIPLE TOOTH EXTRACTIONS    . REPLACEMENT TOTAL KNEE  08/2008; 09/2001   right  . REVERSE SHOULDER ARTHROPLASTY Left 09/03/2017   Procedure: REVERSE SHOULDER ARTHROPLASTY;  Surgeon: Meredith Pel, MD;  Location: [place];  Service: Orthopedics;  Laterality: Left;  . REVERSE SHOULDER ARTHROPLASTY Right 05/13/2018   Procedure: RIGHT REVERSE SHOULDER ARTHROPLASTY;  Surgeon: Meredith Pel, MD;  Location: [place];  Service: Orthopedics;  Laterality: Right;  . rod placement Right 03/2009   R knee  . SYNOVECTOMY  06/2005   debridement, liner exchange right knee  . TOE AMPUTATION Left 08/2008   great toe - osteomyelitis (staph infection)  . TOE AMPUTATION Left 04/2016   2nd toe [name]  . TONSILLECTOMY    . TOTAL KNEE ARTHROPLASTY Left 01/15/2017  . TOTAL KNEE ARTHROPLASTY Left 01/15/2017   Procedure: TOTAL KNEE ARTHROPLASTY;  Surgeon: Meredith Pel, MD;  Location: [place];  Service: Orthopedics;  Laterality: Left;    FAMILY HISTORY: Family History  Problem Relation Age of Onset  . Coronary artery disease Father 23  . Stroke Father   . CAD Mother 58       stents  . Breast cancer Sister   . Diabetes Brother   . Diabetes Sister   . Colon cancer Neg Hx   . Stomach cancer Neg Hx     SOCIAL HISTORY: Social History   Socioeconomic History  . Marital status: Widowed    Spouse name: Not on file  . Number of children: 2  . Years of education: 30  . Highest education level: Not on file  Occupational History  . Occupation: retired  Tobacco Use  . Smoking status: Never Smoker  . Smokeless tobacco: Never Used  Vaping Use  . Vaping Use: Never used  Substance and Sexual Activity  . Alcohol use: No    Alcohol/week: 0.0 standard drinks    Comment: "I abused  alcohol; last drink  ~ 2005"  . Drug use: No  .  Sexual activity: Never  Other Topics Concern  . Not on file  Social History Narrative   Widower 2016 - wife passed from ESRD related illness   Lives in [place] in [place].    M[employer]on      Screened positive for OSA 04/2018   Social Determinants of Health   Financial Resource Strain: Not on file  Food Insecurity: Not on file  Transportation Needs: Not on file  Physical Activity: Not on file  Stress: Not on file  Social Connections: Not on file  Intimate Partner Violence: Not on file   PHYSICAL EXAM   Vitals:   01/05/21 1439  BP: 129/80  Pulse: 68  Weight: 261 lb (118.4 kg)  Height: 5\' 9"  (1.753 m)   Not recorded     Body mass index is 38.54 kg/m.  PHYSICAL EXAMNIATION:  Gen: NAD, conversant, well nourised, well groomed                     Cardiovascular: Regular rate rhythm, no peripheral edema, warm, nontender. Eyes: Conjunctivae clear without exudates or hemorrhage Pulmonary: Clear to auscultation bilaterally   NEUROLOGICAL EXAM:  MENTAL STATUS: Speech:    Speech is normal; fluent and spontaneous with normal comprehension.  Cognition:     Orientation to time, place and person     Normal recent and remote memory     Normal Attention span and concentration     Normal Language, naming, repeating,spontaneous speech      CRANIAL NERVES: CN II: Visual fields are full to confrontation. Pupils are round equal and briskly reactive to light. CN III, IV, VI: extraocular movement are normal. No ptosis. CN V: Facial sensation is intact to light touch CN VII: Face is symmetric with normal eye closure  CN VIII: Hearing is normal to causal conversation. CN IX, X: Phonation is normal. CN XI: Head turning and shoulder shrug are intact  MOTOR: No weakness noted, has to keep right knee extended in sitting position  REFLEXES: Reflexes are 2+ and symmetric at the biceps, triceps, knees, and ankles. Plantar  responses are flexor.  SENSORY: Soft touch is intact all extremities  COORDINATION: Finger-nose-finger is normal bilaterally  GAIT/STANCE: Push-up to get up from seated position, dragging right leg, antalgic, uses walking stick  DIAGNOSTIC DATA (LABS, IMAGING, TESTING) - I reviewed patient records, labs, notes, testing and imaging myself where available.   ASSESSMENT AND PLAN  Nicholas [name] is a 70 y.o. male    1. Chronic migraine headache  -Migraines much improved, 1 migraine every 2-3 weeks   -MRI of brain showed 10 mm hypodensity pituitary microadenoma, less likely contributed to his lifelong history of migraine headaches, concerned about chance of this growing, can consider repeat scan in 1-2 years   -Is not a good candidate for triptan treatment due to reported history of coronary artery disease, and stroke, aging, vascular risk factors  -Continue Ubrelvy 50 mg as needed for abortive treatment, may combine with Zofran and tizanidine  -Continue metoprolol, gabapentin as preventive medication  -Continue Depakote ER 500 mg at bedtime for migraine prevention  -Follow-up in 1 year or sooner if needed  I spent 20 minutes of face-to-face and non-face-to-face time with patient.  This included previsit chart review, lab review, study review, order entry, electronic health record documentation, patient education.  Evangeline Dakin, DNP  [facility] [address] [place], [place] [address] [phone]

## 2021-01-05 NOTE — Patient Instructions (Signed)
Continue Depakote at current dose For acute headache take Roselyn Meier, can combine with Zofran and tizanidine  Continue to see your primary doctor  See you back in 1 year

## 2021-01-09 ENCOUNTER — Ambulatory Visit (INDEPENDENT_AMBULATORY_CARE_PROVIDER_SITE_OTHER): Payer: Medicare Other | Admitting: Internal Medicine

## 2021-01-09 ENCOUNTER — Ambulatory Visit (INDEPENDENT_AMBULATORY_CARE_PROVIDER_SITE_OTHER)
Admission: RE | Admit: 2021-01-09 | Discharge: 2021-01-09 | Disposition: A | Discharge status: Home / Self Care | Payer: Medicare Other | Source: Ambulatory Visit | Attending: Internal Medicine | Admitting: Internal Medicine

## 2021-01-09 ENCOUNTER — Other Ambulatory Visit: Payer: Self-pay

## 2021-01-09 ENCOUNTER — Telehealth: Payer: Self-pay

## 2021-01-09 ENCOUNTER — Encounter: Payer: Self-pay | Admitting: Internal Medicine

## 2021-01-09 VITALS — BP 128/76 | HR 66 | Temp 98.0°F | Wt 265.0 lb

## 2021-01-09 DIAGNOSIS — Z89422 Acquired absence of other left toe(s): Secondary | ICD-10-CM | POA: Diagnosis not present

## 2021-01-09 DIAGNOSIS — Z89412 Acquired absence of left great toe: Secondary | ICD-10-CM

## 2021-01-09 DIAGNOSIS — M7989 Other specified soft tissue disorders: Secondary | ICD-10-CM | POA: Diagnosis not present

## 2021-01-09 DIAGNOSIS — L84 Corns and callosities: Secondary | ICD-10-CM

## 2021-01-09 DIAGNOSIS — M19072 Primary osteoarthritis, left ankle and foot: Secondary | ICD-10-CM | POA: Diagnosis not present

## 2021-01-09 MED ORDER — KETOROLAC TROMETHAMINE 30 MG/ML IJ SOLN
30.0000 mg | Freq: Once | INTRAMUSCULAR | Status: AC
  Administered 2021-01-09: 30 mg via INTRAMUSCULAR

## 2021-01-09 NOTE — Progress Notes (Signed)
Subjective:    Patient ID: Nicholas [name], male    DOB: [birth year], 70 y.o.   MRN: [id number]  HPI  Pt presents to the clinic today with c/o pain at the base of his left great toe. He noticed this 2 weeks ago. He reports the pain is sharp and stabbing. The pain is worse with weightbearing. He has noted some discoloration to the area but denies swelling, redness, warmth or drainage. He has chronic numbness and tingling, on Gabapentin. He denies fever, chills or body aches. He has had an amputation of great and 2nd toe left foot due to MRSA infection. He does see podiatry yearly.  Review of Systems      Past Medical History:  Diagnosis Date  . Acute kidney failure 08/2008   "cleared up"  no problems since  . Allergy   . Anxiety   . Arthritis   . Asthma    ?of this no inhaler  . Blood transfusion   . CHF (congestive heart failure) ([place])    ?of this, pt denies  . CLOSTRIDIUM DIFFICILE COLITIS 07/04/2010   Annotation: 12/09, 2/10 Qualifier: Diagnosis of  By: [name], [name]    . Depression with anxiety   . Duodenitis determined by biopsy 02/2016   peptic likely due to aleve (erosive gastropathy with duodenal erosions)  . ECZEMA 07/04/2010   Qualifier: Diagnosis of  By: [name], [name]    . Elevated liver enzymes   . Full dentures   . GERD (gastroesophageal reflux disease)   . Heart attack ([place])    08/2008 (likely demand ischemia in the setting of MSSA sepsis/R TKA  infection)10-2008  . History of hiatal hernia   . History of stomach ulcers   . Hypothyroidism   . Lower GI bleeding   . MALAR AND MAXILLARY BONES CLOSED FRACTURE 07/04/2010   Annotation: ORIF Qualifier: Diagnosis of  By: [name], [name]    . Migraine    "definitely"  . MRSA (methicillin resistant Staphylococcus aureus)    in leg, had to place steel rod in leg  . Personal history of colonic adenoma 06/01/2003  . PFO (patent foramen ovale)    small PFO by 08/2008 TEE  . Pneumonia 08/2008   "while in ICU"  .  Seizures ([place]) 2009   "long time ago"    . Sleep apnea    does not wear CPAP  . Stroke [facility]) 2010   unable to complete sentences at times  . Wears glasses     Current Outpatient Medications  Medication Sig Dispense Refill  . acetaminophen (TYLENOL) 500 MG tablet Take 1,000 mg by mouth 2 (two) times daily. IN THE AFTERNOON & AT NIGHT    . amitriptyline (ELAVIL) 50 MG tablet TAKE 1-2 TABLETS (50-100 MG TOTAL) BY MOUTH AT BEDTIME. 180 tablet 3  . atorvastatin (LIPITOR) 20 MG tablet TAKE 1 TABLET BY MOUTH EVERY DAY 90 tablet 1  . Cholecalciferol (VITAMIN D3) 25 MCG (1000 UT) CAPS Take 1 capsule (1,000 Units total) by mouth daily. 30 capsule   . clotrimazole (LOTRIMIN) 1 % cream Apply 1 application topically 2 (two) times daily. To groin (Patient taking differently: Apply 1 application topically 3 (three) times daily. To groin) 60 g 1  . divalproex (DEPAKOTE ER) 500 MG 24 hr tablet Take 1 tablet (500 mg total) by mouth at bedtime. 180 tablet 3  . EPINEPHrine 0.3 mg/0.3 mL IJ SOAJ injection Inject 0.3 mLs (0.3 mg total) into the muscle as needed  for anaphylaxis. (bee stings) 2 each 1  . ferrous sulfate 325 (65 FE) MG EC tablet Take 325 mg by mouth every other day.    . furosemide (LASIX) 20 MG tablet TAKE 1 TABLET BY MOUTH EVERY DAY AS NEEDED FOR FLUID OR EDEMA 90 tablet 1  . gabapentin (NEURONTIN) 300 MG capsule Take 1 capsule (300 mg total) by mouth in the morning AND 2 capsules (600 mg total) 2 (two) times daily. (lunch and dinner). 450 capsule 1  . ketoconazole (NIZORAL) 2 % shampoo Apply 1 application topically 3 (three) times a week. 120 mL 3  . levothyroxine (SYNTHROID) 100 MCG tablet TAKE 1 TABLET (100 MCG TOTAL) BY MOUTH DAILY BEFORE BREAKFAST. 90 tablet 2  . methocarbamol (ROBAXIN) 500 MG tablet Take 1 tablet (500 mg total) by mouth every 8 (eight) hours as needed for muscle spasms. 40 tablet 0  . metoprolol succinate (TOPROL-XL) 100 MG 24 hr tablet TAKE 1 TABLET BY MOUTH EVERY DAY 90  tablet 3  . nitroGLYCERIN (NITROSTAT) 0.4 MG SL tablet Place 1 tablet (0.4 mg total) under the tongue every 5 (five) minutes as needed for chest pain. 20 tablet 3  . ondansetron (ZOFRAN ODT) 4 MG disintegrating tablet Take 1 tablet (4 mg total) by mouth every 8 (eight) hours as needed. 20 tablet 6  . pantoprazole (PROTONIX) 40 MG tablet TAKE 1 TABLET BY MOUTH EVERY DAY 90 tablet 3  . SUMAtriptan (IMITREX) 100 MG tablet TAKE 1 TAB AS NEEDED FOR MIGRAINE (REPEAT IN 2 HOURS IF NEEDED). LIMIT TO 2/24 HOUR PERIOD 12 tablet 0  . tiZANidine (ZANAFLEX) 4 MG tablet Take 1 tablet (4 mg total) by mouth every 6 (six) hours as needed for muscle spasms. 30 tablet 6  . triamcinolone (KENALOG) 0.1 % Apply 1 application topically 2 (two) times daily. Apply to AA. 80 g 0  . Ubrogepant (UBRELVY) 50 MG TABS Take 50 mg by mouth daily as needed. 12 tablet 11   No current facility-administered medications for this visit.    Allergies  Allergen Reactions  . Peanut-Containing Drug Products Anaphylaxis and Dermatitis  . Yellow Jacket Venom [Bee Venom] Anaphylaxis and Other (See Comments)    Respiratory Distress  . Celebrex [Celecoxib] Other (See Comments)    BLACK STOOL?MELENA?  [name] Percocet [Oxycodone-Acetaminophen] Hives, Swelling and Other (See Comments)  . Latex Rash  . Nsaids Rash    Family History  Problem Relation Age of Onset  . Coronary artery disease Father 83  . Stroke Father   . CAD Mother 47       stents  . Breast cancer Sister   . Diabetes Brother   . Diabetes Sister   . Colon cancer Neg Hx   . Stomach cancer Neg Hx     Social History   Socioeconomic History  . Marital status: Widowed    Spouse name: Not on file  . Number of children: 2  . Years of education: 23  . Highest education level: Not on file  Occupational History  . Occupation: retired  Tobacco Use  . Smoking status: Never Smoker  . Smokeless tobacco: Never Used  Vaping Use  . Vaping Use: Never used  Substance and  Sexual Activity  . Alcohol use: No    Alcohol/week: 0.0 standard drinks    Comment: "I abused alcohol; last drink  ~ 2005"  . Drug use: No  . Sexual activity: Never  Other Topics Concern  . Not on file  Social History Narrative  Widower 2016 - wife passed from ESRD related illness   Lives in [place] in [place].    Mormon      Screened positive for OSA 04/2018   Social Determinants of Health   Financial Resource Strain: Not on file  Food Insecurity: Not on file  Transportation Needs: Not on file  Physical Activity: Not on file  Stress: Not on file  Social Connections: Not on file  Intimate Partner Violence: Not on file     Constitutional: Denies fever, malaise, fatigue, headache or abrupt weight changes.  Respiratory: Denies difficulty breathing, shortness of breath, cough or sputum production.   Cardiovascular: Denies chest pain, chest tightness, palpitations or swelling in the hands or feet.  Musculoskeletal: Pt reports pain at base of left great toe, prior amputation.. Denies decrease in range of motion, muscle pain or joint swelling.  Skin: Pt reports discoloration of toe. Denies redness, rashes.   No other specific complaints in a complete review of systems (except as listed in HPI above).  Objective:   Physical Exam  BP 128/76   Pulse 66   Temp 98 F (36.7 C) (Temporal)   Wt 265 lb (120.2 kg)   SpO2 96%   BMI 39.13 kg/m   Wt Readings from Last 3 Encounters:  01/05/21 261 lb (118.4 kg)  12/21/20 267 lb 3 oz (121.2 kg)  09/29/20 264 lb 3.2 oz (119.8 kg)    General: Appears his stated age, obese in NAD. Skin: Callus concerning for underlying ulcer of the posterior MTP, left foot. Cardiovascular: Normal rate. Pulmonary/Chest: Normal effort. Musculoskeletal: Amputation of great and 2nd toe, left foot. No joint swelling noted. Limping gait with use of walking stick. Neurological: Alert and oriented.      BMET    Component Value Date/Time    NA 141 12/08/2020 1432   K 4.0 12/08/2020 1432   CL 100 12/08/2020 1432   CO2 32 12/08/2020 1432   GLUCOSE 102 (H) 12/08/2020 1432   BUN 25 (H) 12/08/2020 1432   CREATININE 1.34 12/08/2020 1432   CREATININE 1.20 04/11/2016 0000   CALCIUM 8.6 12/08/2020 1432   GFRNONAA 53 (L) 12/05/2018 0718   GFRAA >60 12/05/2018 0718    Lipid Panel     Component Value Date/Time   CHOL 137 12/08/2020 1432   TRIG 176.0 (H) 12/08/2020 1432   HDL 42.30 12/08/2020 1432   CHOLHDL 3 12/08/2020 1432   VLDL 35.2 12/08/2020 1432   LDLCALC 59 12/08/2020 1432    CBC    Component Value Date/Time   WBC 8.3 12/08/2020 1432   RBC 4.61 12/08/2020 1432   HGB 15.1 12/08/2020 1432   HCT 44.2 12/08/2020 1432   PLT 222.0 12/08/2020 1432   MCV 96.0 12/08/2020 1432   MCV 86.6 02/08/2016 1056   MCH 27.2 12/05/2018 0718   MCHC 34.1 12/08/2020 1432   RDW 14.7 12/08/2020 1432   LYMPHSABS 1.6 12/08/2020 1432   MONOABS 1.1 (H) 12/08/2020 1432   EOSABS 0.3 12/08/2020 1432   BASOSABS 0.0 12/08/2020 1432    Hgb A1C Lab Results  Component Value Date   HGBA1C 5.6 07/13/2010            Assessment & Plan:   Callus of Foot, Hx of Amputation of Great and 2nd Toe, Left Foot:  Xray left foot, r/o osteomyelitis No indication for abx at this time Will likely have him follow up with Podiatry pending xray  Will follow up after imaging, return precautions discussed Rollene Fare  Garnette Gunner, NP This visit occurred during the SARS-CoV-2 public health emergency.  Safety protocols were in place, including screening questions prior to the visit, additional usage of staff PPE, and extensive cleaning of exam room while observing appropriate contact time as indicated for disinfecting solutions.

## 2021-01-09 NOTE — Telephone Encounter (Signed)
[name] - Client Nonclinical Telephone Record [facility]urse Client [name] - Client Client Site [place] Physician [name] - MD Contact Type Call Who Is Calling Patient / Member / Family / Caregiver Caller Name Crecencio Kwiatek Caller Phone Number [phone] Patient Name Nicholas [name] Patient DOB 03/17/1951 Call Type Message Only Information Provided Reason for Call Request to Schedule Office Appointment Initial Comment Needs to make same day appt to have his toe looked at. Pls call back Disp. Time Disposition Final User 01/09/2021 7:57:38 AM General Information Provided Yes Donato Heinz Call Closed By: Donato Heinz Transaction Date/Time: 01/09/2021 7:55:02 AM (ET)

## 2021-01-09 NOTE — Addendum Note (Signed)
Addended by: Lurlean Nanny on: 01/09/2021 01:46 PM   Modules accepted: Orders

## 2021-01-09 NOTE — Telephone Encounter (Signed)
Fabio Asa scheduled appt for pt 01/09/21.

## 2021-01-09 NOTE — Patient Instructions (Signed)
Corns and Calluses Corns are small areas of thickened skin that form on the top, sides, or tip of a toe. Corns have a cone-shaped core with a point that can press on a nerve below. This causes pain. Calluses are areas of thickened skin that can form anywhere on the body, including the hands, fingers, palms, soles of the feet, and heels. Calluses are usually larger than corns. What are the causes? Corns and calluses are caused by rubbing (friction) or pressure, such as from shoes that are too tight or do not fit properly. What increases the risk? Corns are more likely to develop in people who have misshapen toes (toe deformities), such as hammer toes. Calluses can form with friction to any area of the skin. They are more likely to develop in people who:  Work with their hands.  Wear shoes that fit poorly, are too tight, or are high-heeled.  Have toe deformities. What are the signs or symptoms? Symptoms of a corn or callus include:  A hard growth on the skin.  Pain or tenderness under the skin.  Redness and swelling.  Increased discomfort while wearing tight-fitting shoes, if your feet are affected. If a corn or callus becomes infected, symptoms may include:  Redness and swelling that gets worse.  Pain.  Fluid, blood, or pus draining from the corn or callus.   How is this diagnosed? Corns and calluses may be diagnosed based on your symptoms, your medical history, and a physical exam. How is this treated? Treatment for corns and calluses may include:  Removing the cause of the friction or pressure. This may involve: ? Changing your shoes. ? Wearing shoe inserts (orthotics) or other protective layers in your shoes, such as a corn pad. ? Wearing gloves.  Applying medicine to the skin (topical medicine) to help soften skin in the hardened, thickened areas.  Removing layers of dead skin with a file to reduce the size of the corn or callus.  Removing the corn or callus with a  scalpel or laser.  Taking antibiotic medicines, if your corn or callus is infected.  Having surgery, if a toe deformity is the cause. Follow these instructions at home:  Take over-the-counter and prescription medicines only as told by your health care provider.  If you were prescribed an antibiotic medicine, take it as told by your health care provider. Do not stop taking it even if your condition improves.  Wear shoes that fit well. Avoid wearing high-heeled shoes and shoes that are too tight or too loose.  Wear any padding, protective layers, gloves, or orthotics as told by your health care provider.  Soak your hands or feet. Then use a file or pumice stone to soften your corn or callus. Do this as told by your health care provider.  Check your corn or callus every day for signs of infection.   Contact a health care provider if:  Your symptoms do not improve with treatment.  You have redness or swelling that gets worse.  Your corn or callus becomes painful.  You have fluid, blood, or pus coming from your corn or callus.  You have new symptoms. Get help right away if:  You develop severe pain with redness. Summary  Corns are small areas of thickened skin that form on the top, sides, or tip of a toe. These can be painful.  Calluses are areas of thickened skin that can form anywhere on the body, including the hands, fingers, palms, and soles of the   feet. Calluses are usually larger than corns.  Corns and calluses are caused by rubbing (friction) or pressure, such as from shoes that are too tight or do not fit properly.  Treatment may include wearing padding, protective layers, gloves, or orthotics as told by your health care provider. This information is not intended to replace advice given to you by your health care provider. Make sure you discuss any questions you have with your health care provider. Document Revised: 01/28/2020 Document Reviewed: 01/28/2020 Elsevier  Patient Education  2021 [employer]vier Inc.  

## 2021-01-11 ENCOUNTER — Other Ambulatory Visit: Payer: Self-pay

## 2021-01-11 ENCOUNTER — Encounter: Payer: Self-pay | Admitting: Podiatry

## 2021-01-11 ENCOUNTER — Ambulatory Visit (INDEPENDENT_AMBULATORY_CARE_PROVIDER_SITE_OTHER): Payer: Medicare Other | Admitting: Podiatry

## 2021-01-11 DIAGNOSIS — L97522 Non-pressure chronic ulcer of other part of left foot with fat layer exposed: Secondary | ICD-10-CM

## 2021-01-11 DIAGNOSIS — Z899 Acquired absence of limb, unspecified: Secondary | ICD-10-CM

## 2021-01-11 MED ORDER — DOXYCYCLINE HYCLATE 100 MG PO TABS: 100.0000 mg | ORAL_TABLET | Freq: Two times a day (BID) | ORAL | 0 refills | Status: DC

## 2021-01-12 ENCOUNTER — Encounter: Payer: Self-pay | Admitting: Podiatry

## 2021-01-12 NOTE — Progress Notes (Addendum)
Subjective:  Patient ID: Nicholas [name], male    DOB: [birth year],  MRN: [id number]  Chief Complaint  Patient presents with  . Callouses    Left foot     70 y.o. male presents for wound care.  Patient presents with complaint of left submetatarsal 1 ulceration with fat layer exposed.  Patient is a history of first and second digit amputation that was done a while ago.  He states that this ulceration came out of nowhere has progressive gotten worse.  It was mostly a callus but it seems like it is getting more painful.  There is no redness present.  He has not treated the ulceration with any kind of dressings.  He has not seen anyone else prior to see me for this acute problem.  He denies any other acute treatment options.  He is a diabetic but primarily diet control   Review of Systems: Negative except as noted in the HPI. Denies N/V/F/Ch.  Past Medical History:  Diagnosis Date  . Acute kidney failure 08/2008   "cleared up"  no problems since  . Allergy   . Anxiety   . Arthritis   . Asthma    ?of this no inhaler  . Blood transfusion   . CHF (congestive heart failure) (HCC)    ?of this, pt denies  . CLOSTRIDIUM DIFFICILE COLITIS 07/04/2010   Annotation: 12/09, 2/10 Qualifier: Diagnosis of  By: [name], [name]    . Depression with anxiety   . Duodenitis determined by biopsy 02/2016   peptic likely due to aleve (erosive gastropathy with duodenal erosions)  . ECZEMA 07/04/2010   Qualifier: Diagnosis of  By: [name], [name]    . Elevated liver enzymes   . Full dentures   . GERD (gastroesophageal reflux disease)   . Heart attack (HCC)    08/2008 (likely demand ischemia in the setting of MSSA sepsis/R TKA  infection)10-2008  . History of hiatal hernia   . History of stomach ulcers   . Hypothyroidism   . Lower GI bleeding   . MALAR AND MAXILLARY BONES CLOSED FRACTURE 07/04/2010   Annotation: ORIF Qualifier: Diagnosis of  By: [name], [name]    . Migraine    "definitely"  . MRSA  (methicillin resistant Staphylococcus aureus)    in leg, had to place steel rod in leg  . Personal history of colonic adenoma 06/01/2003  . PFO (patent foramen ovale)    small PFO by 08/2008 TEE  . Pneumonia 08/2008   "while in ICU"  . Seizures (HCC) 2009   "long time ago"    . Sleep apnea    does not wear CPAP  . Stroke [facility]) 2010   unable to complete sentences at times  . Wears glasses     Current Outpatient Medications:  .  doxycycline (VIBRA-TABS) 100 MG tablet, Take 1 tablet (100 mg total) by mouth 2 (two) times daily., Disp: 20 tablet, Rfl: 0 .  acetaminophen (TYLENOL) 500 MG tablet, Take 1,000 mg by mouth 2 (two) times daily. IN THE AFTERNOON & AT NIGHT, Disp: , Rfl:  .  amitriptyline (ELAVIL) 50 MG tablet, TAKE 1-2 TABLETS (50-100 MG TOTAL) BY MOUTH AT BEDTIME., Disp: 180 tablet, Rfl: 3 .  atorvastatin (LIPITOR) 20 MG tablet, TAKE 1 TABLET BY MOUTH EVERY DAY, Disp: 90 tablet, Rfl: 1 .  Cholecalciferol (VITAMIN D3) 25 MCG (1000 UT) CAPS, Take 1 capsule (1,000 Units total) by mouth daily., Disp: 30 capsule, Rfl:  .  clotrimazole (  LOTRIMIN) 1 % cream, Apply 1 application topically 2 (two) times daily. To groin (Patient taking differently: Apply 1 application topically 3 (three) times daily. To groin), Disp: 60 g, Rfl: 1 .  divalproex (DEPAKOTE ER) 500 MG 24 hr tablet, Take 1 tablet (500 mg total) by mouth at bedtime., Disp: 180 tablet, Rfl: 3 .  EPINEPHrine 0.3 mg/0.3 mL IJ SOAJ injection, Inject 0.3 mLs (0.3 mg total) into the muscle as needed for anaphylaxis. (bee stings), Disp: 2 each, Rfl: 1 .  ferrous sulfate 325 (65 FE) MG EC tablet, Take 325 mg by mouth every other day., Disp: , Rfl:  .  furosemide (LASIX) 20 MG tablet, TAKE 1 TABLET BY MOUTH EVERY DAY AS NEEDED FOR FLUID OR EDEMA, Disp: 90 tablet, Rfl: 1 .  gabapentin (NEURONTIN) 300 MG capsule, Take 1 capsule (300 mg total) by mouth in the morning AND 2 capsules (600 mg total) 2 (two) times daily. (lunch and dinner)., Disp:  450 capsule, Rfl: 1 .  ketoconazole (NIZORAL) 2 % shampoo, Apply 1 application topically 3 (three) times a week., Disp: 120 mL, Rfl: 3 .  levothyroxine (SYNTHROID) 100 MCG tablet, TAKE 1 TABLET (100 MCG TOTAL) BY MOUTH DAILY BEFORE BREAKFAST., Disp: 90 tablet, Rfl: 2 .  methocarbamol (ROBAXIN) 500 MG tablet, Take 1 tablet (500 mg total) by mouth every 8 (eight) hours as needed for muscle spasms., Disp: 40 tablet, Rfl: 0 .  metoprolol succinate (TOPROL-XL) 100 MG 24 hr tablet, TAKE 1 TABLET BY MOUTH EVERY DAY, Disp: 90 tablet, Rfl: 3 .  nitroGLYCERIN (NITROSTAT) 0.4 MG SL tablet, Place 1 tablet (0.4 mg total) under the tongue every 5 (five) minutes as needed for chest pain., Disp: 20 tablet, Rfl: 3 .  ondansetron (ZOFRAN ODT) 4 MG disintegrating tablet, Take 1 tablet (4 mg total) by mouth every 8 (eight) hours as needed., Disp: 20 tablet, Rfl: 6 .  pantoprazole (PROTONIX) 40 MG tablet, TAKE 1 TABLET BY MOUTH EVERY DAY, Disp: 90 tablet, Rfl: 3 .  SUMAtriptan (IMITREX) 100 MG tablet, TAKE 1 TAB AS NEEDED FOR MIGRAINE (REPEAT IN 2 HOURS IF NEEDED). LIMIT TO 2/24 HOUR PERIOD, Disp: 12 tablet, Rfl: 0 .  tiZANidine (ZANAFLEX) 4 MG tablet, Take 1 tablet (4 mg total) by mouth every 6 (six) hours as needed for muscle spasms., Disp: 30 tablet, Rfl: 6 .  triamcinolone (KENALOG) 0.1 %, Apply 1 application topically 2 (two) times daily. Apply to AA., Disp: 80 g, Rfl: 0 .  Ubrogepant (UBRELVY) 50 MG TABS, Take 50 mg by mouth daily as needed., Disp: 12 tablet, Rfl: 11  Social History   Tobacco Use  Smoking Status Never Smoker  Smokeless Tobacco Never Used    Allergies  Allergen Reactions  . Peanut-Containing Drug Products Anaphylaxis and Dermatitis  . Yellow Jacket Venom [Bee Venom] Anaphylaxis and Other (See Comments)    Respiratory Distress  . Celebrex [Celecoxib] Other (See Comments)    BLACK STOOL?MELENA?  [name] Percocet [Oxycodone-Acetaminophen] Hives, Swelling and Other (See Comments)  . Latex Rash  .  Nsaids Rash   Objective:  There were no vitals filed for this visit. There is no height or weight on file to calculate BMI. Constitutional Well developed. Well nourished.  Vascular Dorsalis pedis pulses faintly palpable bilaterally. Posterior tibial pulses faintly palpable bilaterally. Capillary refill normal to all digits.  No cyanosis or clubbing noted. Pedal hair growth normal.  Neurologic Normal speech. Oriented to person, place, and time. Protective sensation absent  Dermatologic Wound Location: Left  ulcer submetatarsal 1 fat layer exposed Wound Base: Mixed Granular/Fibrotic Peri-wound: Reddened Exudate: Scant/small amount Serosanguinous exudate Wound Measurements: -See below  Orthopedic: No pain to palpation either foot.   Radiographs: Soft tissue swelling surrounding the residual left great toe without evidence of osseous erosion Assessment:   1. History of amputation   2. Chronic ulcer of toe with fat layer exposed, left (HCC)    Plan:  Patient was evaluated and treated and all questions answered.  Ulcer left ulceration submetatarsal 1 with fat layer exposed -Debridement as below. -Dressed with Betadine wet-to-dry, DSD. -Continue off-loading with surgical shoe. -Patient is a high risk of losing that big toe given that he has had previous amputation.  I discussed with the patient extensive detail he states understanding. -Doxycycline was dispensed for skin and soft tissue prophylaxis.  Procedure: Excisional Debridement of Wound Tool: Sharp chisel blade/tissue nipper Rationale: Removal of non-viable soft tissue from the wound to promote healing.  Anesthesia: none Pre-Debridement Wound Measurements: 0.5 cm x 0.7 cm x 0.3 cm  Post-Debridement Wound Measurements: 0.7 cm x 0.8 cm x 0.3 cm  Type of Debridement: Sharp Excisional Tissue Removed: Non-viable soft tissue Blood loss: Minimal (<50cc) Depth of Debridement: subcutaneous tissue. Technique: Sharp excisional  debridement to bleeding, viable wound base.  Wound Progress: This is my initial evaluation we will continue monitor the progression of the wound. Site healing conversation 7 Dressing: Dry, sterile, compression dressing. Disposition: Patient tolerated procedure well. Patient to return in 1 week for follow-up.  Return in about 2 years (around 01/12/2023) for Endre Coutts.

## 2021-01-20 ENCOUNTER — Other Ambulatory Visit: Payer: Self-pay | Admitting: Family Medicine

## 2021-01-21 ENCOUNTER — Other Ambulatory Visit: Payer: Self-pay | Admitting: Family Medicine

## 2021-01-23 ENCOUNTER — Telehealth: Payer: Self-pay

## 2021-01-23 DIAGNOSIS — L97522 Non-pressure chronic ulcer of other part of left foot with fat layer exposed: Secondary | ICD-10-CM | POA: Insufficient documentation

## 2021-01-23 NOTE — Telephone Encounter (Addendum)
Order placed for [facility] wound care to help patient with dressing changes Next appt 02/2021 with me. He also has podiatry f/u already scheduled.

## 2021-01-23 NOTE — Telephone Encounter (Signed)
Patient called triage line and LVM stating that he had some questions regarding his insurance. Called and spoke with patient who stated that he talked with his insurance company and they stated that he needed to call his PCP to get orders for a [facility] RN to come and help him with his dressing changes on his feet. Patient stated that it was very difficult for him to do this by himself. Please advise.

## 2021-01-23 NOTE — Addendum Note (Signed)
Addended by: [name] on: 01/23/2021 05:00 PM   Modules accepted: Orders

## 2021-01-24 NOTE — Telephone Encounter (Addendum)
Noted  

## 2021-01-30 ENCOUNTER — Telehealth: Payer: Self-pay

## 2021-01-30 NOTE — Telephone Encounter (Signed)
Spoke with pt notifying him Dr. Darnell Level placed an order for [name] wound care.  Pt verbalizes understanding and expresses his thanks.

## 2021-01-30 NOTE — Telephone Encounter (Signed)
[facility] Night - Client Nonclinical Telephone Record AccessNurse Client [facility] Night - Client Client Site [place] Physician [name] - MD Contact Type Call Who Is Calling Patient / Member / Family / Caregiver Caller Name Fabrizio Filip Caller Phone Number [phone] Call Type Message Only Information Provided Reason for Call Returning a Call from the Office Initial [place] states she is returning a call to the office. She needs to know why? She will call back next week. Disp. Time Disposition Final User 01/27/2021 1:15:27 PM General Information Provided Yes Artis Flock Call Closed By: Artis Flock Transaction Date/Time: 01/27/2021 1:13:06 PM (ET)

## 2021-02-03 ENCOUNTER — Ambulatory Visit (INDEPENDENT_AMBULATORY_CARE_PROVIDER_SITE_OTHER): Payer: Medicare Other | Admitting: Podiatry

## 2021-02-03 ENCOUNTER — Other Ambulatory Visit: Payer: Self-pay

## 2021-02-03 ENCOUNTER — Encounter: Payer: Self-pay | Admitting: Podiatry

## 2021-02-03 DIAGNOSIS — L97522 Non-pressure chronic ulcer of other part of left foot with fat layer exposed: Secondary | ICD-10-CM | POA: Diagnosis not present

## 2021-02-03 DIAGNOSIS — Z899 Acquired absence of limb, unspecified: Secondary | ICD-10-CM

## 2021-02-03 NOTE — Progress Notes (Signed)
Subjective:  Patient ID: Nicholas [name], male    DOB: [birth year],  MRN: [id number]  Chief Complaint  Patient presents with  . Callouses    Callous     70 y.o. male presents for wound care.  Patient presents for follow-up of left submetatarsal 1 ulceration.  He states that he is doing well.  He put Betadine on the wound.  He denies any other acute complaints is been keeping it dry.  Has been wearing his surgical shoe.   Review of Systems: Negative except as noted in the HPI. Denies N/V/F/Ch.  Past Medical History:  Diagnosis Date  . Acute kidney failure 08/2008   "cleared up"  no problems since  . Allergy   . Anxiety   . Arthritis   . Asthma    ?of this no inhaler  . Blood transfusion   . CHF (congestive heart failure) ([place])    ?of this, pt denies  . CLOSTRIDIUM DIFFICILE COLITIS 07/04/2010   Annotation: 12/09, 2/10 Qualifier: Diagnosis of  By: [name], [name]    . Depression with anxiety   . Duodenitis determined by biopsy 02/2016   peptic likely due to aleve (erosive gastropathy with duodenal erosions)  . ECZEMA 07/04/2010   Qualifier: Diagnosis of  By: [name], [name]    . Elevated liver enzymes   . Full dentures   . GERD (gastroesophageal reflux disease)   . Heart attack ([place])    08/2008 (likely demand ischemia in the setting of MSSA sepsis/R TKA  infection)10-2008  . History of hiatal hernia   . History of stomach ulcers   . Hypothyroidism   . Lower GI bleeding   . MALAR AND MAXILLARY BONES CLOSED FRACTURE 07/04/2010   Annotation: ORIF Qualifier: Diagnosis of  By: [name], [name]    . Migraine    "definitely"  . MRSA (methicillin resistant Staphylococcus aureus)    in leg, had to place steel rod in leg  . Personal history of colonic adenoma 06/01/2003  . PFO (patent foramen ovale)    small PFO by 08/2008 TEE  . Pneumonia 08/2008   "while in ICU"  . Seizures ([place]) 2009   "long time ago"    . Sleep apnea    does not wear CPAP  . Stroke [facility]) 2010   unable  to complete sentences at times  . Wears glasses     Current Outpatient Medications:  .  acetaminophen (TYLENOL) 500 MG tablet, Take 1,000 mg by mouth 2 (two) times daily. IN THE AFTERNOON & AT NIGHT, Disp: , Rfl:  .  amitriptyline (ELAVIL) 50 MG tablet, TAKE 1-2 TABLETS (50-100 MG TOTAL) BY MOUTH AT BEDTIME., Disp: 180 tablet, Rfl: 3 .  atorvastatin (LIPITOR) 20 MG tablet, TAKE 1 TABLET BY MOUTH EVERY DAY, Disp: 90 tablet, Rfl: 1 .  Cholecalciferol (VITAMIN D3) 25 MCG (1000 UT) CAPS, Take 1 capsule (1,000 Units total) by mouth daily., Disp: 30 capsule, Rfl:  .  clotrimazole (LOTRIMIN) 1 % cream, Apply 1 application topically 2 (two) times daily. To groin (Patient taking differently: Apply 1 application topically 3 (three) times daily. To groin), Disp: 60 g, Rfl: 1 .  divalproex (DEPAKOTE ER) 500 MG 24 hr tablet, Take 1 tablet (500 mg total) by mouth at bedtime., Disp: 180 tablet, Rfl: 3 .  doxycycline (VIBRA-TABS) 100 MG tablet, Take 1 tablet (100 mg total) by mouth 2 (two) times daily., Disp: 20 tablet, Rfl: 0 .  EPINEPHrine 0.3 mg/0.3 mL IJ SOAJ injection, Inject  0.3 mLs (0.3 mg total) into the muscle as needed for anaphylaxis. (bee stings), Disp: 2 each, Rfl: 1 .  ferrous sulfate 325 (65 FE) MG EC tablet, Take 325 mg by mouth every other day., Disp: , Rfl:  .  furosemide (LASIX) 20 MG tablet, TAKE 1 TABLET BY MOUTH EVERY DAY AS NEEDED FOR FLUID OR EDEMA, Disp: 90 tablet, Rfl: 1 .  gabapentin (NEURONTIN) 300 MG capsule, Take 1 capsule (300 mg total) by mouth in the morning AND 2 capsules (600 mg total) 2 (two) times daily. (lunch and dinner)., Disp: 450 capsule, Rfl: 1 .  ketoconazole (NIZORAL) 2 % shampoo, Apply 1 application topically 3 (three) times a week., Disp: 120 mL, Rfl: 3 .  levothyroxine (SYNTHROID) 100 MCG tablet, TAKE 1 TABLET BY MOUTH DAILY BEFORE BREAKFAST., Disp: 90 tablet, Rfl: 0 .  methocarbamol (ROBAXIN) 500 MG tablet, Take 1 tablet (500 mg total) by mouth every 8 (eight) hours  as needed for muscle spasms., Disp: 40 tablet, Rfl: 0 .  metoprolol succinate (TOPROL-XL) 100 MG 24 hr tablet, TAKE 1 TABLET BY MOUTH EVERY DAY, Disp: 90 tablet, Rfl: 0 .  nitroGLYCERIN (NITROSTAT) 0.4 MG SL tablet, Place 1 tablet (0.4 mg total) under the tongue every 5 (five) minutes as needed for chest pain., Disp: 20 tablet, Rfl: 3 .  ondansetron (ZOFRAN ODT) 4 MG disintegrating tablet, Take 1 tablet (4 mg total) by mouth every 8 (eight) hours as needed., Disp: 20 tablet, Rfl: 6 .  pantoprazole (PROTONIX) 40 MG tablet, TAKE 1 TABLET BY MOUTH EVERY DAY, Disp: 90 tablet, Rfl: 3 .  SUMAtriptan (IMITREX) 100 MG tablet, TAKE 1 TAB AS NEEDED FOR MIGRAINE (REPEAT IN 2 HOURS IF NEEDED). LIMIT TO 2/24 HOUR PERIOD, Disp: 12 tablet, Rfl: 0 .  tiZANidine (ZANAFLEX) 4 MG tablet, Take 1 tablet (4 mg total) by mouth every 6 (six) hours as needed for muscle spasms., Disp: 30 tablet, Rfl: 6 .  triamcinolone (KENALOG) 0.1 %, Apply 1 application topically 2 (two) times daily. Apply to AA., Disp: 80 g, Rfl: 0 .  Ubrogepant (UBRELVY) 50 MG TABS, Take 50 mg by mouth daily as needed., Disp: 12 tablet, Rfl: 11  Social History   Tobacco Use  Smoking Status Never Smoker  Smokeless Tobacco Never Used    Allergies  Allergen Reactions  . Peanut-Containing Drug Products Anaphylaxis and Dermatitis  . Yellow Jacket Venom [Bee Venom] Anaphylaxis and Other (See Comments)    Respiratory Distress  . Celebrex [Celecoxib] Other (See Comments)    BLACK STOOL?MELENA?  [name] Percocet [Oxycodone-Acetaminophen] Hives, Swelling and Other (See Comments)  . Latex Rash  . Nsaids Rash   Objective:  There were no vitals filed for this visit. There is no height or weight on file to calculate BMI. Constitutional Well developed. Well nourished.  Vascular Dorsalis pedis pulses faintly palpable bilaterally. Posterior tibial pulses faintly palpable bilaterally. Capillary refill normal to all digits.  No cyanosis or clubbing  noted. Pedal hair growth normal.  Neurologic Normal speech. Oriented to person, place, and time. Protective sensation absent  Dermatologic  left submetatarsal 1 ulceration completely epithelialized.  No concern for infection noted.  Orthopedic: No pain to palpation either foot.   Radiographs: Soft tissue swelling surrounding the residual left great toe without evidence of osseous erosion Assessment:   No diagnosis found. Plan:  Patient was evaluated and treated and all questions answered.  Ulcer left ulceration submetatarsal 1 with fat layer exposed -Clinically the ulceration has healed completely.  At  this time I discussed with him that he can return back to regular shoes.  There is recurrent blisters breaking down and will need to make some modifications to his shoes.  Patient states understanding.  No follow-ups on file.

## 2021-02-14 ENCOUNTER — Other Ambulatory Visit: Payer: Self-pay

## 2021-02-14 ENCOUNTER — Encounter: Payer: Self-pay | Admitting: Family Medicine

## 2021-02-14 ENCOUNTER — Ambulatory Visit (INDEPENDENT_AMBULATORY_CARE_PROVIDER_SITE_OTHER): Payer: Medicare Other | Admitting: Family Medicine

## 2021-02-14 VITALS — BP 120/80 | HR 63 | Temp 97.9°F | Ht 68.0 in | Wt 256.1 lb

## 2021-02-14 DIAGNOSIS — E559 Vitamin D deficiency, unspecified: Secondary | ICD-10-CM

## 2021-02-14 DIAGNOSIS — K2100 Gastro-esophageal reflux disease with esophagitis, without bleeding: Secondary | ICD-10-CM

## 2021-02-14 DIAGNOSIS — E785 Hyperlipidemia, unspecified: Secondary | ICD-10-CM

## 2021-02-14 DIAGNOSIS — Z89412 Acquired absence of left great toe: Secondary | ICD-10-CM

## 2021-02-14 DIAGNOSIS — Z0000 Encounter for general adult medical examination without abnormal findings: Secondary | ICD-10-CM | POA: Diagnosis not present

## 2021-02-14 DIAGNOSIS — E538 Deficiency of other specified B group vitamins: Secondary | ICD-10-CM

## 2021-02-14 DIAGNOSIS — Z23 Encounter for immunization: Secondary | ICD-10-CM

## 2021-02-14 DIAGNOSIS — G43109 Migraine with aura, not intractable, without status migrainosus: Secondary | ICD-10-CM

## 2021-02-14 DIAGNOSIS — D352 Benign neoplasm of pituitary gland: Secondary | ICD-10-CM

## 2021-02-14 DIAGNOSIS — E611 Iron deficiency: Secondary | ICD-10-CM

## 2021-02-14 DIAGNOSIS — N1831 Chronic kidney disease, stage 3a: Secondary | ICD-10-CM

## 2021-02-14 DIAGNOSIS — E039 Hypothyroidism, unspecified: Secondary | ICD-10-CM

## 2021-02-14 MED ORDER — CYANOCOBALAMIN 1000 MCG/ML IJ SOLN
1000.0000 ug | Freq: Once | INTRAMUSCULAR | Status: AC
  Administered 2021-02-14: 1000 ug via INTRAMUSCULAR

## 2021-02-14 NOTE — Patient Instructions (Addendum)
Schedule nurse visit for monthly B12 shots. Restart today.  Pneumovax-23 today  Restart vitamin D 1000 units daily.  Good to see you today  Return as needed or in 4-6 months for follow up visit.   Health Maintenance After Age 70 After age 43, you are at a higher risk for certain long-term diseases and infections as well as injuries from falls. Falls are a major cause of broken bones and head injuries in people who are older than age 4. Getting regular preventive care can help to keep you healthy and well. Preventive care includes getting regular testing and making lifestyle changes as recommended by your health care provider. Talk with your health care provider about:  Which screenings and tests you should have. A screening is a test that checks for a disease when you have no symptoms.  A diet and exercise plan that is right for you. What should I know about screenings and tests to prevent falls? Screening and testing are the best ways to find a health problem early. Early diagnosis and treatment give you the best chance of managing medical conditions that are common after age 29. Certain conditions and lifestyle choices may make you more likely to have a fall. Your health care provider may recommend:  Regular vision checks. Poor vision and conditions such as cataracts can make you more likely to have a fall. If you wear glasses, make sure to get your prescription updated if your vision changes.  Medicine review. Work with your health care provider to regularly review all of the medicines you are taking, including over-the-counter medicines. Ask your health care provider about any side effects that may make you more likely to have a fall. Tell your health care provider if any medicines that you take make you feel dizzy or sleepy.  Osteoporosis screening. Osteoporosis is a condition that causes the bones to get weaker. This can make the bones weak and cause them to break more easily.  Blood  pressure screening. Blood pressure changes and medicines to control blood pressure can make you feel dizzy.  Strength and balance checks. Your health care provider may recommend certain tests to check your strength and balance while standing, walking, or changing positions.  Foot health exam. Foot pain and numbness, as well as not wearing proper footwear, can make you more likely to have a fall.  Depression screening. You may be more likely to have a fall if you have a fear of falling, feel emotionally low, or feel unable to do activities that you used to do.  Alcohol use screening. Using too much alcohol can affect your balance and may make you more likely to have a fall. What actions can I take to lower my risk of falls? General instructions  Talk with your health care provider about your risks for falling. Tell your health care provider if: ? You fall. Be sure to tell your health care provider about all falls, even ones that seem minor. ? You feel dizzy, sleepy, or off-balance.  Take over-the-counter and prescription medicines only as told by your health care provider. These include any supplements.  Eat a healthy diet and maintain a healthy weight. A healthy diet includes low-fat dairy products, low-fat (lean) meats, and fiber from whole grains, beans, and lots of fruits and vegetables. Home safety  Remove any tripping hazards, such as rugs, cords, and clutter.  Install safety equipment such as grab bars in bathrooms and safety rails on stairs.  Keep rooms and walkways  well-lit. Activity  Follow a regular exercise program to stay fit. This will help you maintain your balance. Ask your health care provider what types of exercise are appropriate for you.  If you need a cane or walker, use it as recommended by your health care provider.  Wear supportive shoes that have nonskid soles.   Lifestyle  Do not drink alcohol if your health care provider tells you not to drink.  If you  drink alcohol, limit how much you have: ? 0-1 drink a day for women. ? 0-2 drinks a day for men.  Be aware of how much alcohol is in your drink. In the [place] one drink equals one typical bottle of beer (12 oz), one-half glass of wine (5 oz), or one shot of hard liquor (1 oz).  Do not use any products that contain nicotine or tobacco, such as cigarettes and e-cigarettes. If you need help quitting, ask your health care provider. Summary  Having a healthy lifestyle and getting preventive care can help to protect your health and wellness after age 10.  Screening and testing are the best way to find a health problem early and help you avoid having a fall. Early diagnosis and treatment give you the best chance for managing medical conditions that are more common for people who are older than age 84.  Falls are a major cause of broken bones and head injuries in people who are older than age 70. Take precautions to prevent a fall at home.  Work with your health care provider to learn what changes you can make to improve your health and wellness and to prevent falls. This information is not intended to replace advice given to you by your health care provider. Make sure you discuss any questions you have with your health care provider. Document Revised: 01/22/2019 Document Reviewed: 08/14/2017 Elsevier Patient Education  2021 [employer].

## 2021-02-14 NOTE — Progress Notes (Incomplete)
Patient ID: Nicholas [name], male    DOB: 01/30/1951, 70 y.o.   MRN: [id number]  This visit was conducted in person.  BP 120/80   Pulse 63   Temp 97.9 F (36.6 C) (Temporal)   Ht 5\' 8"  (1.727 m)   Wt 256 lb 2 oz (116.2 kg)   SpO2 94%   BMI 38.94 kg/m    CC: AMW/CPE Subjective:   HPI: Nicholas WHELLER is a 70 y.o. male presenting on 02/14/2021 for Medicare Wellness   Did not see health advisor   Hearing Screening   125Hz  250Hz  500Hz  1000Hz  2000Hz  3000Hz  4000Hz  6000Hz  8000Hz   Right ear:   25 40 40  0    Left ear:   0 0 40  0    Vision Screening Comments: Last eye exam, 09/2020.  [facility] Visit from 02/14/2021 in [facility]  PHQ-2 Total Score 0      Fall Risk  02/14/2021 12/11/2019 12/10/2018 11/18/2017 10/05/2016  Falls in the past year? - 1 0 Yes No  Comment - tripped and fell - lost balance and fell -  Number falls in past yr: 1 0 - 1 -  Injury with Fall? 0 0 - No -  Risk for fall due to : - Medication side effect;Impaired mobility - - -  Follow up - Falls evaluation completed;Falls prevention discussed - - -  Had a fall yesterday, lost footing.   L3/4 L4/5 decompression 11/2018 for severe spinal stenosis Nicholas [name]) with improved back pain. Now starting to notice some R lower back pain.   S/p 1st/2nd L toe amputations for osteomyelitis with gangrene.   Chronic migraine headache in setting of 30mm pituitary microadenoma by MRI - established with neurology, started on Ubrelvy 50mg  PRN with zofran and tizanidine PRN. Continues metoprolol and gabapentin and depakote ER preventatively.   He's not been taking B12 shots in several months.   Preventative: COLONOSCOPY 11/2012 diverticulosis, rpt 10 yrs Nicholas [name]) Prostate cancer screening -declinesDRE, agrees to PSA next time Lung cancer screening -not eligible  DEXA - pt states he had this done at [facility]  Flu shotyearly - didn't get this year  COVID vaccine - declines  Td 2007  Pneumovax  2012 - rpt today, prevnar2017  zostavax - ~2014  Shingrix -2019 x2 Advanced directive discussion -has not set up. Sonwould beHCPOA. Packet previously provided.  Seat belt use discussed  Sunscreen usediscussed. No changing moles on skin.  Non smoker  Alcohol - none Dentist yearly Eye exam yearly Bowel - no constipation  Bladder - no incontinence  Widower 2016 - wife passed from ESRD related illness [facility] [place]. [employer]: [employer]: HS Activity: no regular exercise - residual deficits due to multiple surgeries  Diet: some water, fruits/vegetables daily     Relevant past medical, surgical, family and social history reviewed and updated as indicated. Interim medical history since our last visit reviewed. Allergies and medications reviewed and updated. Outpatient Medications Prior to Visit  Medication Sig Dispense Refill  . acetaminophen (TYLENOL) 500 MG tablet Take 1,000 mg by mouth 2 (two) times daily. IN THE AFTERNOON & AT NIGHT    . amitriptyline (ELAVIL) 50 MG tablet TAKE 1-2 TABLETS (50-100 MG TOTAL) BY MOUTH AT BEDTIME. 180 tablet 3  . atorvastatin (LIPITOR) 20 MG tablet TAKE 1 TABLET BY MOUTH EVERY DAY 90 tablet 1  . Cholecalciferol (VITAMIN D3) 25 MCG (1000 UT) CAPS Take 1 capsule (1,000  Units total) by mouth daily. 30 capsule   . clotrimazole (LOTRIMIN) 1 % cream Apply 1 application topically 2 (two) times daily. To groin (Patient taking differently: Apply 1 application topically 3 (three) times daily. To groin) 60 g 1  . divalproex (DEPAKOTE ER) 500 MG 24 hr tablet Take 1 tablet (500 mg total) by mouth at bedtime. 180 tablet 3  . doxycycline (VIBRA-TABS) 100 MG tablet Take 1 tablet (100 mg total) by mouth 2 (two) times daily. 20 tablet 0  . EPINEPHrine 0.3 mg/0.3 mL IJ SOAJ injection Inject 0.3 mLs (0.3 mg total) into the muscle as needed for anaphylaxis. (bee stings) 2 each 1  . ferrous sulfate 325 (65 FE) MG EC tablet Take 325  mg by mouth every other day.    . furosemide (LASIX) 20 MG tablet TAKE 1 TABLET BY MOUTH EVERY DAY AS NEEDED FOR FLUID OR EDEMA 90 tablet 1  . gabapentin (NEURONTIN) 300 MG capsule Take 1 capsule (300 mg total) by mouth in the morning AND 2 capsules (600 mg total) 2 (two) times daily. (lunch and dinner). 450 capsule 1  . ketoconazole (NIZORAL) 2 % shampoo Apply 1 application topically 3 (three) times a week. 120 mL 3  . levothyroxine (SYNTHROID) 100 MCG tablet TAKE 1 TABLET BY MOUTH DAILY BEFORE BREAKFAST. 90 tablet 0  . methocarbamol (ROBAXIN) 500 MG tablet Take 1 tablet (500 mg total) by mouth every 8 (eight) hours as needed for muscle spasms. 40 tablet 0  . metoprolol succinate (TOPROL-XL) 100 MG 24 hr tablet TAKE 1 TABLET BY MOUTH EVERY DAY 90 tablet 0  . nitroGLYCERIN (NITROSTAT) 0.4 MG SL tablet Place 1 tablet (0.4 mg total) under the tongue every 5 (five) minutes as needed for chest pain. 20 tablet 3  . ondansetron (ZOFRAN ODT) 4 MG disintegrating tablet Take 1 tablet (4 mg total) by mouth every 8 (eight) hours as needed. 20 tablet 6  . pantoprazole (PROTONIX) 40 MG tablet TAKE 1 TABLET BY MOUTH EVERY DAY 90 tablet 3  . SUMAtriptan (IMITREX) 100 MG tablet TAKE 1 TAB AS NEEDED FOR MIGRAINE (REPEAT IN 2 HOURS IF NEEDED). LIMIT TO 2/24 HOUR PERIOD 12 tablet 0  . tiZANidine (ZANAFLEX) 4 MG tablet Take 1 tablet (4 mg total) by mouth every 6 (six) hours as needed for muscle spasms. 30 tablet 6  . triamcinolone (KENALOG) 0.1 % Apply 1 application topically 2 (two) times daily. Apply to AA. 80 g 0  . Ubrogepant (UBRELVY) 50 MG TABS Take 50 mg by mouth daily as needed. 12 tablet 11   No facility-administered medications prior to visit.     Per HPI unless specifically indicated in ROS section below Review of Systems  Constitutional: Negative for activity change, appetite change, chills, fatigue, fever and unexpected weight change.  HENT: Negative for hearing loss.   Eyes: Negative for visual  disturbance.  Respiratory: Positive for cough. Negative for chest tightness, shortness of breath and wheezing.   Cardiovascular: Negative for chest pain, palpitations and leg swelling.  Gastrointestinal: Negative for abdominal distention, abdominal pain, blood in stool, constipation, diarrhea, nausea and vomiting.  Genitourinary: Negative for difficulty urinating and hematuria.  Musculoskeletal: Negative for arthralgias, myalgias and neck pain.  Skin: Negative for rash.  Neurological: Positive for dizziness and headaches. Negative for seizures and syncope.  Hematological: Negative for adenopathy. Does not bruise/bleed easily.  Psychiatric/Behavioral: Negative for dysphoric mood. The patient is not nervous/anxious.    Objective:  BP 120/80   Pulse 63  Temp 97.9 F (36.6 C) (Temporal)   Ht 5\' 8"  (1.727 m)   Wt 256 lb 2 oz (116.2 kg)   SpO2 94%   BMI 38.94 kg/m   Wt Readings from Last 3 Encounters:  02/14/21 256 lb 2 oz (116.2 kg)  01/09/21 265 lb (120.2 kg)  01/05/21 261 lb (118.4 kg)      Physical Exam Vitals and nursing note reviewed.  Constitutional:      General: He is not in acute distress.    Appearance: Normal appearance. He is well-developed. He is not ill-appearing.  HENT:     Head: Normocephalic and atraumatic.     Right Ear: Hearing, tympanic membrane, ear canal and external ear normal.     Left Ear: Hearing, tympanic membrane, ear canal and external ear normal.  Eyes:     General: No scleral icterus.    Extraocular Movements: Extraocular movements intact.     Conjunctiva/sclera: Conjunctivae normal.     Pupils: Pupils are equal, round, and reactive to light.  Neck:     Thyroid: No thyroid mass or thyromegaly.  Cardiovascular:     Rate and Rhythm: Normal rate and regular rhythm.     Pulses: Normal pulses.          Radial pulses are 2+ on the right side and 2+ on the left side.     Heart sounds: Normal heart sounds. No murmur heard.   Pulmonary:     Effort:  Pulmonary effort is normal. No respiratory distress.     Breath sounds: Normal breath sounds. No wheezing, rhonchi or rales.  Abdominal:     General: Abdomen is flat. Bowel sounds are normal. There is no distension.     Palpations: Abdomen is soft. There is no mass.     Tenderness: There is no abdominal tenderness. There is no guarding or rebound.     Hernia: No hernia is present.  Musculoskeletal:        General: Normal range of motion.     Cervical back: Normal range of motion and neck supple.     Right lower leg: No edema.     Left lower leg: No edema.     Comments: R leg chronically fixed in extension  Lymphadenopathy:     Cervical: No cervical adenopathy.  Skin:    General: Skin is warm and dry.     Findings: No rash.  Neurological:     General: No focal deficit present.     Mental Status: He is alert and oriented to person, place, and time.     Comments:  CN grossly intact, station and gait intact Recall 3/3  Calculation 5/5 DLROW  Psychiatric:        Mood and Affect: Mood normal.        Behavior: Behavior normal.        Thought Content: Thought content normal.        Judgment: Judgment normal.       Results for orders placed or performed in visit on 12/08/20  IBC panel  Result Value Ref Range   Iron 63 42 - 165 ug/dL   Transferrin 227.0 212.0 - 360.0 mg/dL   Saturation Ratios 19.8 (L) 20.0 - 50.0 %  Ferritin  Result Value Ref Range   Ferritin 53.8 22.0 - 322.0 ng/mL  CBC with Differential/Platelet  Result Value Ref Range   WBC 8.3 4.0 - 10.5 K/uL   RBC 4.61 4.22 - 5.81 Mil/uL   Hemoglobin 15.1 13.0 -  17.0 g/dL   HCT 44.2 39.0 - 52.0 %   MCV 96.0 78.0 - 100.0 fl   MCHC 34.1 30.0 - 36.0 g/dL   RDW 14.7 11.5 - 15.5 %   Platelets 222.0 150.0 - 400.0 K/uL   Neutrophils Relative % 63.5 43.0 - 77.0 %   Lymphocytes Relative 19.6 12.0 - 46.0 %   Monocytes Relative 12.7 (H) 3.0 - 12.0 %   Eosinophils Relative 3.6 0.0 - 5.0 %   Basophils Relative 0.6 0.0 - 3.0 %    Neutro Abs 5.3 1.4 - 7.7 K/uL   Lymphs Abs 1.6 0.7 - 4.0 K/uL   Monocytes Absolute 1.1 (H) 0.1 - 1.0 K/uL   Eosinophils Absolute 0.3 0.0 - 0.7 K/uL   Basophils Absolute 0.0 0.0 - 0.1 K/uL  PSA  Result Value Ref Range   PSA 1.43 0.10 - 4.00 ng/mL  TSH  Result Value Ref Range   TSH 3.22 0.35 - 4.50 uIU/mL  Comprehensive metabolic panel  Result Value Ref Range   Sodium 141 135 - 145 mEq/L   Potassium 4.0 3.5 - 5.1 mEq/L   Chloride 100 96 - 112 mEq/L   CO2 32 19 - 32 mEq/L   Glucose, Bld 102 (H) 70 - 99 mg/dL   BUN 25 (H) 6 - 23 mg/dL   Creatinine, Ser 1.34 0.40 - 1.50 mg/dL   Total Bilirubin 0.9 0.2 - 1.2 mg/dL   Alkaline Phosphatase 86 39 - 117 U/L   AST 16 0 - 37 U/L   ALT 12 0 - 53 U/L   Total Protein 6.8 6.0 - 8.3 g/dL   Albumin 4.0 3.5 - 5.2 g/dL   GFR 53.84 (L) >60.00 mL/min   Calcium 8.6 8.4 - 10.5 mg/dL  Lipid panel  Result Value Ref Range   Cholesterol 137 0 - 200 mg/dL   Triglycerides 176.0 (H) 0.0 - 149.0 mg/dL   HDL 42.30 >39.00 mg/dL   VLDL 35.2 0.0 - 40.0 mg/dL   LDL Cholesterol 59 0 - 99 mg/dL   Total CHOL/HDL Ratio 3    NonHDL 94.33   VITAMIN D 25 Hydroxy (Vit-D Deficiency, Fractures)  Result Value Ref Range   VITD 25.83 (L) 30.00 - 100.00 ng/mL  Vitamin B12  Result Value Ref Range   Vitamin B-12 306 211 - 911 pg/mL   Depression screen [facility] 2/9 02/14/2021 12/11/2019 12/10/2018 11/18/2017 10/05/2016  Decreased Interest 0 0 0 0 0  Down, Depressed, Hopeless 0 0 0 0 3  PHQ - 2 Score 0 0 0 0 3  Altered sleeping - 0 0 0 0  Tired, decreased energy - 0 0 0 3  Change in appetite - 0 0 0 2  Feeling bad or failure about yourself  - 0 0 0 0  Trouble concentrating - 0 0 0 0  Moving slowly or fidgety/restless - 0 0 0 0  Suicidal thoughts - 0 0 0 0  PHQ-9 Score - 0 0 0 8  Difficult doing work/chores - Not difficult at all Not difficult at all Not difficult at all Somewhat difficult  Some recent data might be hidden   No flowsheet data found.  Assessment & Plan:   This visit occurred during the SARS-CoV-2 public health emergency.  Safety protocols were in place, including screening questions prior to the visit, additional usage of staff PPE, and extensive cleaning of exam room while observing appropriate contact time as indicated for disinfecting solutions.   Problem List Items Addressed This Visit  Vitamin B12 deficiency    Other Visit Diagnoses    Need for 23-polyvalent pneumococcal polysaccharide vaccine    -  Primary   Relevant Orders   Pneumococcal polysaccharide vaccine 23-valent greater than or equal to 2yo subcutaneous/IM (Completed)       Meds ordered this encounter  Medications  . cyanocobalamin ((VITAMIN B-12)) injection 1,000 mcg   Orders Placed This Encounter  Procedures  . Pneumococcal polysaccharide vaccine 23-valent greater than or equal to 2yo subcutaneous/IM    Patient instructions: Schedule nurse visit for monthly B12 shots. Restart today.  Pneumovax-23 today  Restart vitamin D 1000 units daily.  Good to see you today  Return as needed or in 4-6 months for follow up visit.   Follow up plan: Return in about 4 months (around 06/17/2021) for follow up visit.  [name], MD

## 2021-02-14 NOTE — Progress Notes (Signed)
Patient ID: Nicholas [name], male    DOB: 1950-12-27, 70 y.o.   MRN: [id number]  This visit was conducted in person.  BP 120/80   Pulse 63   Temp 97.9 F (36.6 C) (Temporal)   Ht 5\' 8"  (1.727 m)   Wt 256 lb 2 oz (116.2 kg)   SpO2 94%   BMI 38.94 kg/m    CC: AMW/CPE Subjective:   HPI: Nicholas [name] is a 70 y.o. male presenting on 02/14/2021 for Medicare Wellness   Did not see health advisor   Hearing Screening   125Hz  250Hz  500Hz  1000Hz  2000Hz  3000Hz  4000Hz  6000Hz  8000Hz   Right ear:   25 40 40  0    Left ear:   0 0 40  0    Vision Screening Comments: Last eye exam, 09/2020.  [facility] [address] [facility] Visit from 02/14/2021 in [facility]  PHQ-2 Total Score 0      Fall Risk  02/14/2021 12/11/2019 12/10/2018 11/18/2017 10/05/2016  Falls in the past year? - 1 0 Yes No  Comment - tripped and fell - lost balance and fell -  Number falls in past yr: 1 0 - 1 -  Injury with Fall? 0 0 - No -  Risk for fall due to : - Medication side effect;Impaired mobility - - -  Follow up - Falls evaluation completed;Falls prevention discussed - - -  Had a fall yesterday, lost balance.   L3/4 L4/5 decompression 11/2018 for severe spinal stenosis [name]) with improved back pain. Now starting to notice some R lower back pain.   S/p 1st/2nd L toe amputations for osteomyelitis with gangrene.   Chronic migraine headache in setting of 28mm pituitary microadenoma by MRI - established with neurology, started on Ubrelvy 50mg  PRN with zofran and tizanidine PRN. Continues metoprolol and gabapentin and depakote ER preventatively.   He's not been taking B12 shots in several months.   Preventative: COLONOSCOPY 11/2012 diverticulosis, rpt 10 yrs [name]) Prostate cancer screening -declinesDRE, agrees to PSA next time Lung cancer screening -not eligible  DEXA - pt states he had this done at [facility]  Flu shotyearly - didn't get this year  COVID vaccine - declines  Td 2007  Pneumovax  2012 - rpt today, prevnar2017  zostavax - ~2014  Shingrix -2019 x2 Advanced directive discussion -has not set up. Sonwould beHCPOA. Packet previously provided.  Seat belt use discussed  Sunscreen usediscussed. No changing moles on skin.  Non smoker  Alcohol - none Dentist yearly Eye exam yearly Bowel - no constipation  Bladder - no incontinence  Widower 2016 - wife passed from ESRD related illness [facility] [place]. [employer]: landscaping Edu: HS Activity: no regular exercise - residual deficits due to multiple surgeries  Diet: some water, fruits/vegetables daily     Relevant past medical, surgical, family and social history reviewed and updated as indicated. Interim medical history since our last visit reviewed. Allergies and medications reviewed and updated. Outpatient Medications Prior to Visit  Medication Sig Dispense Refill  . acetaminophen (TYLENOL) 500 MG tablet Take 1,000 mg by mouth 2 (two) times daily. IN THE AFTERNOON & AT NIGHT    . amitriptyline (ELAVIL) 50 MG tablet TAKE 1-2 TABLETS (50-100 MG TOTAL) BY MOUTH AT BEDTIME. 180 tablet 3  . atorvastatin (LIPITOR) 20 MG tablet TAKE 1 TABLET BY MOUTH EVERY DAY 90 tablet 1  . Cholecalciferol (VITAMIN D3) 25 MCG (1000 UT) CAPS Take 1 capsule (1,000  Units total) by mouth daily. 30 capsule   . clotrimazole (LOTRIMIN) 1 % cream Apply 1 application topically 2 (two) times daily. To groin (Patient taking differently: Apply 1 application topically 3 (three) times daily. To groin) 60 g 1  . divalproex (DEPAKOTE ER) 500 MG 24 hr tablet Take 1 tablet (500 mg total) by mouth at bedtime. 180 tablet 3  . doxycycline (VIBRA-TABS) 100 MG tablet Take 1 tablet (100 mg total) by mouth 2 (two) times daily. 20 tablet 0  . EPINEPHrine 0.3 mg/0.3 mL IJ SOAJ injection Inject 0.3 mLs (0.3 mg total) into the muscle as needed for anaphylaxis. (bee stings) 2 each 1  . ferrous sulfate 325 (65 FE) MG EC tablet Take 325  mg by mouth every other day.    . furosemide (LASIX) 20 MG tablet TAKE 1 TABLET BY MOUTH EVERY DAY AS NEEDED FOR FLUID OR EDEMA 90 tablet 1  . gabapentin (NEURONTIN) 300 MG capsule Take 1 capsule (300 mg total) by mouth in the morning AND 2 capsules (600 mg total) 2 (two) times daily. (lunch and dinner). 450 capsule 1  . ketoconazole (NIZORAL) 2 % shampoo Apply 1 application topically 3 (three) times a week. 120 mL 3  . levothyroxine (SYNTHROID) 100 MCG tablet TAKE 1 TABLET BY MOUTH DAILY BEFORE BREAKFAST. 90 tablet 0  . methocarbamol (ROBAXIN) 500 MG tablet Take 1 tablet (500 mg total) by mouth every 8 (eight) hours as needed for muscle spasms. 40 tablet 0  . metoprolol succinate (TOPROL-XL) 100 MG 24 hr tablet TAKE 1 TABLET BY MOUTH EVERY DAY 90 tablet 0  . nitroGLYCERIN (NITROSTAT) 0.4 MG SL tablet Place 1 tablet (0.4 mg total) under the tongue every 5 (five) minutes as needed for chest pain. 20 tablet 3  . ondansetron (ZOFRAN ODT) 4 MG disintegrating tablet Take 1 tablet (4 mg total) by mouth every 8 (eight) hours as needed. 20 tablet 6  . pantoprazole (PROTONIX) 40 MG tablet TAKE 1 TABLET BY MOUTH EVERY DAY 90 tablet 3  . SUMAtriptan (IMITREX) 100 MG tablet TAKE 1 TAB AS NEEDED FOR MIGRAINE (REPEAT IN 2 HOURS IF NEEDED). LIMIT TO 2/24 HOUR PERIOD 12 tablet 0  . tiZANidine (ZANAFLEX) 4 MG tablet Take 1 tablet (4 mg total) by mouth every 6 (six) hours as needed for muscle spasms. 30 tablet 6  . triamcinolone (KENALOG) 0.1 % Apply 1 application topically 2 (two) times daily. Apply to AA. 80 g 0  . Ubrogepant (UBRELVY) 50 MG TABS Take 50 mg by mouth daily as needed. 12 tablet 11   No facility-administered medications prior to visit.     Per HPI unless specifically indicated in ROS section below Review of Systems  Constitutional: Negative for activity change, appetite change, chills, fatigue, fever and unexpected weight change.  HENT: Negative for hearing loss.   Eyes: Negative for visual  disturbance.  Respiratory: Positive for cough. Negative for chest tightness, shortness of breath and wheezing.   Cardiovascular: Negative for chest pain, palpitations and leg swelling.  Gastrointestinal: Negative for abdominal distention, abdominal pain, blood in stool, constipation, diarrhea, nausea and vomiting.  Genitourinary: Negative for difficulty urinating and hematuria.  Musculoskeletal: Negative for arthralgias, myalgias and neck pain.  Skin: Negative for rash.  Neurological: Positive for dizziness and headaches. Negative for seizures and syncope.  Hematological: Negative for adenopathy. Does not bruise/bleed easily.  Psychiatric/Behavioral: Negative for dysphoric mood. The patient is not nervous/anxious.    Objective:  BP 120/80   Pulse 63  Temp 97.9 F (36.6 C) (Temporal)   Ht 5\' 8"  (1.727 m)   Wt 256 lb 2 oz (116.2 kg)   SpO2 94%   BMI 38.94 kg/m   Wt Readings from Last 3 Encounters:  02/14/21 256 lb 2 oz (116.2 kg)  01/09/21 265 lb (120.2 kg)  01/05/21 261 lb (118.4 kg)      Physical Exam Vitals and nursing note reviewed.  Constitutional:      General: He is not in acute distress.    Appearance: Normal appearance. He is well-developed. He is not ill-appearing.  HENT:     Head: Normocephalic and atraumatic.     Right Ear: Hearing, tympanic membrane, ear canal and external ear normal.     Left Ear: Hearing, tympanic membrane, ear canal and external ear normal.  Eyes:     General: No scleral icterus.    Extraocular Movements: Extraocular movements intact.     Conjunctiva/sclera: Conjunctivae normal.     Pupils: Pupils are equal, round, and reactive to light.  Neck:     Thyroid: No thyroid mass or thyromegaly.  Cardiovascular:     Rate and Rhythm: Normal rate and regular rhythm.     Pulses: Normal pulses.          Radial pulses are 2+ on the right side and 2+ on the left side.     Heart sounds: Normal heart sounds. No murmur heard.   Pulmonary:     Effort:  Pulmonary effort is normal. No respiratory distress.     Breath sounds: Normal breath sounds. No wheezing, rhonchi or rales.  Abdominal:     General: Abdomen is flat. Bowel sounds are normal. There is no distension.     Palpations: Abdomen is soft. There is no mass.     Tenderness: There is no abdominal tenderness. There is no guarding or rebound.     Hernia: No hernia is present.  Musculoskeletal:        General: Normal range of motion.     Cervical back: Normal range of motion and neck supple.     Right lower leg: No edema.     Left lower leg: No edema.     Comments: R leg chronically fixed in extension  Lymphadenopathy:     Cervical: No cervical adenopathy.  Skin:    General: Skin is warm and dry.     Findings: No rash.  Neurological:     General: No focal deficit present.     Mental Status: He is alert and oriented to person, place, and time.     Comments:  CN grossly intact, station and gait intact Recall 3/3  Calculation 5/5 DLROW  Psychiatric:        Mood and Affect: Mood normal.        Behavior: Behavior normal.        Thought Content: Thought content normal.        Judgment: Judgment normal.       Results for orders placed or performed in visit on 12/08/20  IBC panel  Result Value Ref Range   Iron 63 42 - 165 ug/dL   Transferrin 227.0 212.0 - 360.0 mg/dL   Saturation Ratios 19.8 (L) 20.0 - 50.0 %  Ferritin  Result Value Ref Range   Ferritin 53.8 22.0 - 322.0 ng/mL  CBC with Differential/Platelet  Result Value Ref Range   WBC 8.3 4.0 - 10.5 K/uL   RBC 4.61 4.22 - 5.81 Mil/uL   Hemoglobin 15.1 13.0 -  17.0 g/dL   HCT 44.2 39.0 - 52.0 %   MCV 96.0 78.0 - 100.0 fl   MCHC 34.1 30.0 - 36.0 g/dL   RDW 14.7 11.5 - 15.5 %   Platelets 222.0 150.0 - 400.0 K/uL   Neutrophils Relative % 63.5 43.0 - 77.0 %   Lymphocytes Relative 19.6 12.0 - 46.0 %   Monocytes Relative 12.7 (H) 3.0 - 12.0 %   Eosinophils Relative 3.6 0.0 - 5.0 %   Basophils Relative 0.6 0.0 - 3.0 %    Neutro Abs 5.3 1.4 - 7.7 K/uL   Lymphs Abs 1.6 0.7 - 4.0 K/uL   Monocytes Absolute 1.1 (H) 0.1 - 1.0 K/uL   Eosinophils Absolute 0.3 0.0 - 0.7 K/uL   Basophils Absolute 0.0 0.0 - 0.1 K/uL  PSA  Result Value Ref Range   PSA 1.43 0.10 - 4.00 ng/mL  TSH  Result Value Ref Range   TSH 3.22 0.35 - 4.50 uIU/mL  Comprehensive metabolic panel  Result Value Ref Range   Sodium 141 135 - 145 mEq/L   Potassium 4.0 3.5 - 5.1 mEq/L   Chloride 100 96 - 112 mEq/L   CO2 32 19 - 32 mEq/L   Glucose, Bld 102 (H) 70 - 99 mg/dL   BUN 25 (H) 6 - 23 mg/dL   Creatinine, Ser 1.34 0.40 - 1.50 mg/dL   Total Bilirubin 0.9 0.2 - 1.2 mg/dL   Alkaline Phosphatase 86 39 - 117 U/L   AST 16 0 - 37 U/L   ALT 12 0 - 53 U/L   Total Protein 6.8 6.0 - 8.3 g/dL   Albumin 4.0 3.5 - 5.2 g/dL   GFR 53.84 (L) >60.00 mL/min   Calcium 8.6 8.4 - 10.5 mg/dL  Lipid panel  Result Value Ref Range   Cholesterol 137 0 - 200 mg/dL   Triglycerides 176.0 (H) 0.0 - 149.0 mg/dL   HDL 42.30 >39.00 mg/dL   VLDL 35.2 0.0 - 40.0 mg/dL   LDL Cholesterol 59 0 - 99 mg/dL   Total CHOL/HDL Ratio 3    NonHDL 94.33   VITAMIN D 25 Hydroxy (Vit-D Deficiency, Fractures)  Result Value Ref Range   VITD 25.83 (L) 30.00 - 100.00 ng/mL  Vitamin B12  Result Value Ref Range   Vitamin B-12 306 211 - 911 pg/mL   Depression screen [facility] 2/9 02/14/2021 12/11/2019 12/10/2018 11/18/2017 10/05/2016  Decreased Interest 0 0 0 0 0  Down, Depressed, Hopeless 0 0 0 0 3  PHQ - 2 Score 0 0 0 0 3  Altered sleeping - 0 0 0 0  Tired, decreased energy - 0 0 0 3  Change in appetite - 0 0 0 2  Feeling bad or failure about yourself  - 0 0 0 0  Trouble concentrating - 0 0 0 0  Moving slowly or fidgety/restless - 0 0 0 0  Suicidal thoughts - 0 0 0 0  PHQ-9 Score - 0 0 0 8  Difficult doing work/chores - Not difficult at all Not difficult at all Not difficult at all Somewhat difficult  Some recent data might be hidden   No flowsheet data found.  Assessment & Plan:   This visit occurred during the SARS-CoV-2 public health emergency.  Safety protocols were in place, including screening questions prior to the visit, additional usage of staff PPE, and extensive cleaning of exam room while observing appropriate contact time as indicated for disinfecting solutions.   Problem List Items Addressed This Visit  Migraine    Stable period on metoprolol, gabapentin, depakote daily with PRN ubrelvy, zofran, tizanidine       GERD (gastroesophageal reflux disease)    Continue pantoprazole 40mg  daily.       Iron deficiency    Continue ferrous sulfate QOD dosing.      Severe obesity (BMI 35.0-39.9) with comorbidity ([place])   Hypothyroidism    Continue levothyroxine daily       Medicare annual wellness visit, subsequent - Primary    I have personally reviewed the Medicare Annual Wellness questionnaire and have noted 1. The patient's medical and social history 2. Their use of alcohol, tobacco or illicit drugs 3. Their current medications and supplements 4. The patient's functional ability including ADL's, fall risks, home safety risks and hearing or visual impairment. Cognitive function has been assessed and addressed as indicated.  5. Diet and physical activity 6. Evidence for depression or mood disorders The patients weight, height, BMI have been recorded in the chart. I have made referrals, counseling and provided education to the patient based on review of the above and I have provided the pt with a written personalized care plan for preventive services. Provider list updated.. See scanned questionairre as needed for further documentation. Reviewed preventative protocols and updated unless pt declined.       CKD (chronic kidney disease) stage 3, GFR 30-59 ml/min (HCC)    Continue to monitor. Encouraged good hydration status      Health maintenance examination    Preventative protocols reviewed and updated unless pt declined. Discussed healthy diet and  lifestyle.       Dyslipidemia    Chronic, stable on atorvastatin - continue. The 10-year ASCVD risk score Mikey Bussing DC Brooke Bonito., et al., 2013) is: 16.8%   Values used to calculate the score:     Age: 70 years     Sex: Male     Is Non-Hispanic African American: No     Diabetic: No     Tobacco smoker: No     Systolic Blood Pressure: 123456 mmHg     Is BP treated: Yes     HDL Cholesterol: 42.3 mg/dL     Total Cholesterol: 137 mg/dL       Status post amputation of great toe, left (HCC)   Vitamin B12 deficiency    Restart monthly b12 shots - first one today       Vitamin D deficiency    Restart vit D 1000 IU daily.       Pituitary adenoma ([place])    Saw neurology - not thought contributory to migraines - will continue to monitor.       Other Visit Diagnoses    Need for 23-polyvalent pneumococcal polysaccharide vaccine       Relevant Orders   Pneumococcal polysaccharide vaccine 23-valent greater than or equal to 2yo subcutaneous/IM (Completed)       Meds ordered this encounter  Medications  . cyanocobalamin ((VITAMIN B-12)) injection 1,000 mcg   Orders Placed This Encounter  Procedures  . Pneumococcal polysaccharide vaccine 23-valent greater than or equal to 2yo subcutaneous/IM    Patient instructions: Schedule nurse visit for monthly B12 shots. Restart today.  Pneumovax-23 today  Restart vitamin D 1000 units daily.  Good to see you today  Return as needed or in 4-6 months for follow up visit.   Follow up plan: Return in about 4 months (around 06/17/2021) for follow up visit.  [name], MD

## 2021-02-15 NOTE — Assessment & Plan Note (Signed)
Saw neurology - not thought contributory to migraines - will continue to monitor.

## 2021-02-15 NOTE — Assessment & Plan Note (Signed)
Restart vit D 1000 IU daily.

## 2021-02-15 NOTE — Assessment & Plan Note (Addendum)
Continue to monitor. Encouraged good hydration status

## 2021-02-15 NOTE — Assessment & Plan Note (Signed)
Continue ferrous sulfate QOD dosing.

## 2021-02-15 NOTE — Assessment & Plan Note (Signed)
Preventative protocols reviewed and updated unless pt declined. Discussed healthy diet and lifestyle.  

## 2021-02-15 NOTE — Assessment & Plan Note (Signed)

## 2021-02-15 NOTE — Assessment & Plan Note (Signed)
Stable period on metoprolol, gabapentin, depakote daily with PRN ubrelvy, zofran, tizanidine

## 2021-02-15 NOTE — Assessment & Plan Note (Signed)
Restart monthly b12 shots - first one today

## 2021-02-15 NOTE — Assessment & Plan Note (Signed)
Continue levothyroxine daily

## 2021-02-15 NOTE — Assessment & Plan Note (Signed)
Continue pantoprazole 40 mg daily.  ?

## 2021-02-15 NOTE — Assessment & Plan Note (Signed)
Chronic, stable on atorvastatin - continue. The 10-year ASCVD risk score Nicholas [name] DC Nicholas [name]., et al., 2013) is: 16.8%   Values used to calculate the score:     Age: 70 years     Sex: Male     Is Non-Hispanic African American: No     Diabetic: No     Tobacco smoker: No     Systolic Blood Pressure: 867 mmHg     Is BP treated: Yes     HDL Cholesterol: 42.3 mg/dL     Total Cholesterol: 137 mg/dL

## 2021-02-17 ENCOUNTER — Other Ambulatory Visit: Payer: Self-pay | Admitting: Family Medicine

## 2021-02-17 DIAGNOSIS — K2960 Other gastritis without bleeding: Secondary | ICD-10-CM

## 2021-02-17 DIAGNOSIS — K269 Duodenal ulcer, unspecified as acute or chronic, without hemorrhage or perforation: Secondary | ICD-10-CM

## 2021-02-22 ENCOUNTER — Other Ambulatory Visit: Payer: Self-pay

## 2021-02-22 ENCOUNTER — Ambulatory Visit: Payer: Medicare Other

## 2021-02-24 ENCOUNTER — Telehealth: Payer: Self-pay | Admitting: Family Medicine

## 2021-02-24 ENCOUNTER — Ambulatory Visit: Payer: Medicare Other | Admitting: Family Medicine

## 2021-02-26 ENCOUNTER — Other Ambulatory Visit: Payer: Self-pay | Admitting: Podiatry

## 2021-02-27 ENCOUNTER — Ambulatory Visit: Payer: Medicare Other | Admitting: Family Medicine

## 2021-02-27 NOTE — Telephone Encounter (Signed)
Please advise 

## 2021-02-28 NOTE — Telephone Encounter (Signed)
Lvm asking pt to call back.  Need to check on pt and r/s missed OV.

## 2021-02-28 NOTE — Telephone Encounter (Signed)
Pt missed appt on Friday and again yesterday which is not like him - please call to check on him and offer rescheduling.

## 2021-03-01 ENCOUNTER — Ambulatory Visit (INDEPENDENT_AMBULATORY_CARE_PROVIDER_SITE_OTHER): Payer: Medicare Other | Admitting: Family Medicine

## 2021-03-01 ENCOUNTER — Emergency Department: Payer: Medicare Other

## 2021-03-01 ENCOUNTER — Ambulatory Visit (INDEPENDENT_AMBULATORY_CARE_PROVIDER_SITE_OTHER)
Admission: RE | Admit: 2021-03-01 | Discharge: 2021-03-01 | Disposition: A | Discharge status: Home / Self Care | Payer: Medicare Other | Source: Ambulatory Visit | Attending: Family Medicine | Admitting: Family Medicine

## 2021-03-01 ENCOUNTER — Encounter: Payer: Self-pay | Admitting: Family Medicine

## 2021-03-01 ENCOUNTER — Emergency Department
Admission: EM | Admit: 2021-03-01 | Discharge: 2021-03-01 | Disposition: A | Discharge status: Home / Self Care | Payer: Medicare Other | Attending: Emergency Medicine | Admitting: Emergency Medicine

## 2021-03-01 ENCOUNTER — Other Ambulatory Visit: Payer: Self-pay

## 2021-03-01 ENCOUNTER — Encounter: Payer: Self-pay | Admitting: Emergency Medicine

## 2021-03-01 VITALS — BP 120/72 | HR 70 | Temp 97.6°F | Ht 68.0 in | Wt 252.4 lb

## 2021-03-01 DIAGNOSIS — Z89412 Acquired absence of left great toe: Secondary | ICD-10-CM | POA: Insufficient documentation

## 2021-03-01 DIAGNOSIS — Z955 Presence of coronary angioplasty implant and graft: Secondary | ICD-10-CM | POA: Insufficient documentation

## 2021-03-01 DIAGNOSIS — M5136 Other intervertebral disc degeneration, lumbar region: Secondary | ICD-10-CM | POA: Diagnosis not present

## 2021-03-01 DIAGNOSIS — S0990XA Unspecified injury of head, initial encounter: Secondary | ICD-10-CM | POA: Diagnosis not present

## 2021-03-01 DIAGNOSIS — M48061 Spinal stenosis, lumbar region without neurogenic claudication: Secondary | ICD-10-CM

## 2021-03-01 DIAGNOSIS — Z723 Lack of physical exercise: Secondary | ICD-10-CM | POA: Diagnosis not present

## 2021-03-01 DIAGNOSIS — M7989 Other specified soft tissue disorders: Secondary | ICD-10-CM | POA: Diagnosis not present

## 2021-03-01 DIAGNOSIS — G8929 Other chronic pain: Secondary | ICD-10-CM | POA: Diagnosis not present

## 2021-03-01 DIAGNOSIS — M1990 Unspecified osteoarthritis, unspecified site: Secondary | ICD-10-CM

## 2021-03-01 DIAGNOSIS — Z043 Encounter for examination and observation following other accident: Secondary | ICD-10-CM | POA: Diagnosis not present

## 2021-03-01 DIAGNOSIS — M79604 Pain in right leg: Secondary | ICD-10-CM

## 2021-03-01 DIAGNOSIS — I509 Heart failure, unspecified: Secondary | ICD-10-CM | POA: Insufficient documentation

## 2021-03-01 DIAGNOSIS — E039 Hypothyroidism, unspecified: Secondary | ICD-10-CM | POA: Insufficient documentation

## 2021-03-01 DIAGNOSIS — R52 Pain, unspecified: Secondary | ICD-10-CM | POA: Diagnosis not present

## 2021-03-01 DIAGNOSIS — Z9889 Other specified postprocedural states: Secondary | ICD-10-CM | POA: Diagnosis not present

## 2021-03-01 DIAGNOSIS — M79662 Pain in left lower leg: Secondary | ICD-10-CM | POA: Diagnosis not present

## 2021-03-01 DIAGNOSIS — G894 Chronic pain syndrome: Secondary | ICD-10-CM

## 2021-03-01 DIAGNOSIS — N183 Chronic kidney disease, stage 3 unspecified: Secondary | ICD-10-CM | POA: Insufficient documentation

## 2021-03-01 DIAGNOSIS — Z91040 Latex allergy status: Secondary | ICD-10-CM | POA: Insufficient documentation

## 2021-03-01 DIAGNOSIS — Z981 Arthrodesis status: Secondary | ICD-10-CM | POA: Diagnosis not present

## 2021-03-01 DIAGNOSIS — Z96611 Presence of right artificial shoulder joint: Secondary | ICD-10-CM | POA: Diagnosis not present

## 2021-03-01 DIAGNOSIS — J45909 Unspecified asthma, uncomplicated: Secondary | ICD-10-CM | POA: Insufficient documentation

## 2021-03-01 DIAGNOSIS — R2242 Localized swelling, mass and lump, left lower limb: Secondary | ICD-10-CM | POA: Diagnosis not present

## 2021-03-01 DIAGNOSIS — Y92810 Car as the place of occurrence of the external cause: Secondary | ICD-10-CM | POA: Insufficient documentation

## 2021-03-01 DIAGNOSIS — R531 Weakness: Secondary | ICD-10-CM | POA: Diagnosis not present

## 2021-03-01 DIAGNOSIS — M51369 Other intervertebral disc degeneration, lumbar region without mention of lumbar back pain or lower extremity pain: Secondary | ICD-10-CM

## 2021-03-01 DIAGNOSIS — Z96651 Presence of right artificial knee joint: Secondary | ICD-10-CM | POA: Diagnosis not present

## 2021-03-01 DIAGNOSIS — Z91010 Allergy to peanuts: Secondary | ICD-10-CM | POA: Diagnosis not present

## 2021-03-01 DIAGNOSIS — W19XXXA Unspecified fall, initial encounter: Secondary | ICD-10-CM

## 2021-03-01 DIAGNOSIS — R5381 Other malaise: Secondary | ICD-10-CM

## 2021-03-01 DIAGNOSIS — Z96612 Presence of left artificial shoulder joint: Secondary | ICD-10-CM | POA: Insufficient documentation

## 2021-03-01 DIAGNOSIS — Z743 Need for continuous supervision: Secondary | ICD-10-CM | POA: Diagnosis not present

## 2021-03-01 DIAGNOSIS — R9431 Abnormal electrocardiogram [ECG] [EKG]: Secondary | ICD-10-CM | POA: Diagnosis not present

## 2021-03-01 DIAGNOSIS — M25472 Effusion, left ankle: Secondary | ICD-10-CM

## 2021-03-01 DIAGNOSIS — R0689 Other abnormalities of breathing: Secondary | ICD-10-CM | POA: Diagnosis not present

## 2021-03-01 DIAGNOSIS — Z79899 Other long term (current) drug therapy: Secondary | ICD-10-CM | POA: Insufficient documentation

## 2021-03-01 DIAGNOSIS — E237 Disorder of pituitary gland, unspecified: Secondary | ICD-10-CM | POA: Diagnosis not present

## 2021-03-01 DIAGNOSIS — M545 Low back pain, unspecified: Secondary | ICD-10-CM | POA: Diagnosis not present

## 2021-03-01 DIAGNOSIS — Z96653 Presence of artificial knee joint, bilateral: Secondary | ICD-10-CM | POA: Diagnosis not present

## 2021-03-01 DIAGNOSIS — I8391 Asymptomatic varicose veins of right lower extremity: Secondary | ICD-10-CM | POA: Diagnosis not present

## 2021-03-01 DIAGNOSIS — M79661 Pain in right lower leg: Secondary | ICD-10-CM | POA: Diagnosis not present

## 2021-03-01 LAB — CBC
HCT: 42.8 % (ref 39.0–52.0)
Hemoglobin: 14.2 g/dL (ref 13.0–17.0)
MCH: 32.3 pg (ref 26.0–34.0)
MCHC: 33.2 g/dL (ref 30.0–36.0)
MCV: 97.3 fL (ref 80.0–100.0)
Platelets: 201 10*3/uL (ref 150–400)
RBC: 4.4 MIL/uL (ref 4.22–5.81)
RDW: 13.9 % (ref 11.5–15.5)
WBC: 7.9 10*3/uL (ref 4.0–10.5)
nRBC: 0 % (ref 0.0–0.2)

## 2021-03-01 LAB — BASIC METABOLIC PANEL
Anion gap: 12 (ref 5–15)
BUN: 15 mg/dL (ref 8–23)
CO2: 24 mmol/L (ref 22–32)
Calcium: 8.7 mg/dL — ABNORMAL LOW (ref 8.9–10.3)
Chloride: 103 mmol/L (ref 98–111)
Creatinine, Ser: 1.58 mg/dL — ABNORMAL HIGH (ref 0.61–1.24)
GFR, Estimated: 47 mL/min — ABNORMAL LOW (ref >60)
Glucose, Bld: 104 mg/dL — ABNORMAL HIGH (ref 70–99)
Potassium: 4.2 mmol/L (ref 3.5–5.1)
Sodium: 139 mmol/L (ref 135–145)

## 2021-03-01 LAB — TROPONIN I (HIGH SENSITIVITY)
Troponin I (High Sensitivity): 3 ng/L (ref <18)
Troponin I (High Sensitivity): 3 ng/L (ref <18)

## 2021-03-01 LAB — D-DIMER, QUANTITATIVE: D-Dimer, Quant: 3.13 ug/mL-FEU — ABNORMAL HIGH (ref 0.00–0.50)

## 2021-03-01 LAB — CK: Total CK: 109 U/L (ref 49–397)

## 2021-03-01 MED ORDER — SODIUM CHLORIDE 0.9 % IV BOLUS
1000.0000 mL | Freq: Once | INTRAVENOUS | Status: AC
  Administered 2021-03-01: 1000 mL via INTRAVENOUS

## 2021-03-01 NOTE — ED Notes (Signed)
Pt calm , collective , discharge instructions reviewed

## 2021-03-01 NOTE — Progress Notes (Signed)
Patient ID: KERRIGAN VANSKIVER, male    DOB: 1951-02-15, 70 y.o.   MRN: [id number]  This visit was conducted in person.  BP 120/72   Pulse 70   Temp 97.6 F (36.4 C) (Temporal)   Ht 5\' 8"  (1.727 m)   Wt 252 lb 6 oz (114.5 kg)   SpO2 93%   BMI 38.37 kg/m    CC: R leg pain  Subjective:   HPI: CAYDEN CORBIT is a 70 y.o. male presenting on 03/01/2021 for Leg Pain (C/o right leg pain from hip down to foot.  Started about 2 wks ago, worsening.  Has fallen twice. )   2 wk h/o progressively worsening bilateral leg weakness that occurs with activity. R>L weakness. Worsening R knee pain as well, without redness or warmth or swelling. Right leg chronically swollen compared to left. Has had 2 falls in the past 2 weeks - called EMS to help get him up. He has life alert system in place. Notes worsening R lower back pain as well   No fevers/chills, numbness of the leg, dysesthesia. No chest pain or dyspnea. No dizziness. No claudication symptoms.   L3/4 L4/5 decompression 11/2018 for severe spinal stenosis Roda Shutters) with improved back pain. Now starting to notice some R lower back pain.   S/p 1st/2nd L toe amputations for osteomyelitis with gangrene. Had been seeing podiatry Allena Katz) until recent healing of L toe ulcer - last visit 01/2021.     Past Surgical History:  Procedure Laterality Date  . anterior  nerve transposition  07/2009   left ulnar nerve  . antibiotic spacer exchange  11/2008; 08/2006   right knee  . ARTHROTOMY  08/2008   right knee w/I&D  . CARDIOVASCULAR STRESS TEST  2013   stress EKG - negative for ischemia, 4 min 7.3 METs, normal blood pressure response  . CATARACT EXTRACTION    . COLONOSCOPY  11/2012   diverticulosis, rpt 10 yrs [name])  . ESOPHAGOGASTRODUODENOSCOPY  02/2016   erosive gastropathy with duodenal erosions [name])  . HARDWARE REMOVAL  04/2006   right knee w/antibiotic spacers placed  . INGUINAL HERNIA REPAIR  early 1990's   bilateral  . JOINT REPLACEMENT  Bilateral   . KNEE ARTHROSCOPY  10/2001   right  . KNEE FUSION  03/2009   right knee removal; antibiotic spacers;   . LEFT HEART CATH AND CORONARY ANGIOGRAPHY N/A 02/04/2018    WNL (Patwardhan, [name], MD)  . LUMBAR LAMINECTOMY/DECOMPRESSION MICRODISCECTOMY N/A 12/05/2018   Procedure: LUMBAR THREE TO FOUR AND LUMBAR FOUR TO FIVE DECOMPRESSION;  Surgeon: [name], MD;  Location: [facility] OR;  Service: Orthopedics;  Laterality: N/A;  . MULTIPLE TOOTH EXTRACTIONS    . REPLACEMENT TOTAL KNEE  08/2008; 09/2001   right  . REVERSE SHOULDER ARTHROPLASTY Left 09/03/2017   Procedure: REVERSE SHOULDER ARTHROPLASTY;  Surgeon: [name], MD;  Location: [facility] OR;  Service: Orthopedics;  Laterality: Left;  . REVERSE SHOULDER ARTHROPLASTY Right 05/13/2018   Procedure: RIGHT REVERSE SHOULDER ARTHROPLASTY;  Surgeon: [name], MD;  Location: [facility] OR;  Service: Orthopedics;  Laterality: Right;  . rod placement Right 03/2009   R knee  . SYNOVECTOMY  06/2005   debridement, liner exchange right knee  . TOE AMPUTATION Left 08/2008   great toe - osteomyelitis (staph infection)  . TOE AMPUTATION Left 04/2016   2nd toe August Saucer)  . TONSILLECTOMY    . TOTAL KNEE ARTHROPLASTY Left 01/15/2017  . TOTAL KNEE ARTHROPLASTY Left  01/15/2017   Procedure: TOTAL KNEE ARTHROPLASTY;  Surgeon: Meredith Pel, MD;  Location: [place];  Service: Orthopedics;  Laterality: Left;   Relevant past medical, surgical, family and social history reviewed and updated as indicated. Interim medical history since our last visit reviewed. Allergies and medications reviewed and updated. Outpatient Medications Prior to Visit  Medication Sig Dispense Refill  . acetaminophen (TYLENOL) 500 MG tablet Take 1,000 mg by mouth 2 (two) times daily. IN THE AFTERNOON & AT NIGHT    . amitriptyline (ELAVIL) 50 MG tablet TAKE 1-2 TABLETS (50-100 MG TOTAL) BY MOUTH AT BEDTIME. 180 tablet 3  . atorvastatin (LIPITOR) 20 MG tablet TAKE 1 TABLET BY  MOUTH EVERY DAY 90 tablet 1  . clotrimazole (LOTRIMIN) 1 % cream Apply 1 application topically 2 (two) times daily. To groin (Patient taking differently: Apply 1 application topically 3 (three) times daily. To groin) 60 g 1  . divalproex (DEPAKOTE ER) 500 MG 24 hr tablet Take 1 tablet (500 mg total) by mouth at bedtime. 180 tablet 3  . doxycycline (VIBRA-TABS) 100 MG tablet TAKE 1 TABLET BY MOUTH TWICE A DAY 20 tablet 0  . EPINEPHrine 0.3 mg/0.3 mL IJ SOAJ injection Inject 0.3 mLs (0.3 mg total) into the muscle as needed for anaphylaxis. (bee stings) 2 each 1  . ferrous sulfate 325 (65 FE) MG EC tablet Take 325 mg by mouth every other day.    . furosemide (LASIX) 20 MG tablet TAKE 1 TABLET BY MOUTH EVERY DAY AS NEEDED FOR FLUID OR EDEMA. 90 tablet 1  . gabapentin (NEURONTIN) 300 MG capsule Take 1 capsule (300 mg total) by mouth in the morning AND 2 capsules (600 mg total) 2 (two) times daily. (lunch and dinner). 450 capsule 1  . ketoconazole (NIZORAL) 2 % shampoo Apply 1 application topically 3 (three) times a week. 120 mL 3  . levothyroxine (SYNTHROID) 100 MCG tablet TAKE 1 TABLET BY MOUTH DAILY BEFORE BREAKFAST. 90 tablet 0  . metoprolol succinate (TOPROL-XL) 100 MG 24 hr tablet TAKE 1 TABLET BY MOUTH EVERY DAY 90 tablet 0  . nitroGLYCERIN (NITROSTAT) 0.4 MG SL tablet Place 1 tablet (0.4 mg total) under the tongue every 5 (five) minutes as needed for chest pain. 20 tablet 3  . ondansetron (ZOFRAN ODT) 4 MG disintegrating tablet Take 1 tablet (4 mg total) by mouth every 8 (eight) hours as needed. 20 tablet 6  . pantoprazole (PROTONIX) 40 MG tablet TAKE 1 TABLET BY MOUTH EVERY DAY 90 tablet 3  . SUMAtriptan (IMITREX) 100 MG tablet TAKE 1 TAB AS NEEDED FOR MIGRAINE (REPEAT IN 2 HOURS IF NEEDED). LIMIT TO 2/24 HOUR PERIOD 12 tablet 0  . tiZANidine (ZANAFLEX) 4 MG tablet Take 1 tablet (4 mg total) by mouth every 6 (six) hours as needed for muscle spasms. 30 tablet 6  . triamcinolone (KENALOG) 0.1 %  Apply 1 application topically 2 (two) times daily. Apply to AA. 80 g 0  . Ubrogepant (UBRELVY) 50 MG TABS Take 50 mg by mouth daily as needed. 12 tablet 11  . methocarbamol (ROBAXIN) 500 MG tablet Take 1 tablet (500 mg total) by mouth every 8 (eight) hours as needed for muscle spasms. 40 tablet 0  . Cholecalciferol (VITAMIN D3) 25 MCG (1000 UT) CAPS Take 1 capsule (1,000 Units total) by mouth daily. (Patient not taking: Reported on 03/01/2021) 30 capsule    No facility-administered medications prior to visit.     Per HPI unless specifically indicated in ROS section  below Review of Systems Objective:  BP 120/72   Pulse 70   Temp 97.6 F (36.4 C) (Temporal)   Ht 5\' 8"  (1.727 m)   Wt 252 lb 6 oz (114.5 kg)   SpO2 93%   BMI 38.37 kg/m   Wt Readings from Last 3 Encounters:  03/01/21 252 lb (114.3 kg)  03/01/21 252 lb 6 oz (114.5 kg)  02/14/21 256 lb 2 oz (116.2 kg)      Physical Exam Vitals and nursing note reviewed.  Constitutional:      Appearance: Normal appearance. He is not ill-appearing.     Comments:  Walks with cane Slowed antalgic gait   Cardiovascular:     Rate and Rhythm: Normal rate and regular rhythm.     Pulses: Normal pulses.     Heart sounds: Normal heart sounds. No murmur heard.   Pulmonary:     Effort: Pulmonary effort is normal. No respiratory distress.     Breath sounds: Normal breath sounds. No wheezing, rhonchi or rales.  Musculoskeletal:        General: Tenderness present.     Right lower leg: No edema.     Left lower leg: No edema.     Comments:  R knee chronically fixed in extension due to rod  Diffuse tenderness to palpation along R anterior leg, predominantly to medial leg distal to knee (?anserine bursa) No palpable cords   Skin:    General: Skin is warm and dry.     Findings: No erythema or rash.  Neurological:     Mental Status: He is alert.     Comments:  Grip strength intact bilaterally Equal strength to BLE testing       RIGHT  TIBIA AND FIBULA - 2 VIEW COMPARISON:  03/25/2020 FINDINGS: Medullary rod is noted extending from the femur into the tibia with fusion of right knee joint. Significant remodeling in the knee joint is seen. Distal fixation screws are seen. No acute fracture or dislocation is noted. Soft tissues show some varicosities medially. IMPRESSION: Chronic postsurgical changes are noted in the femur and tibia. No acute abnormality is seen. Electronically Signed   By: Inez Catalina M.D.   On: 03/01/2021 19:28  Assessment & Plan:  This visit occurred during the SARS-CoV-2 public health emergency.  Safety protocols were in place, including screening questions prior to the visit, additional usage of staff PPE, and extensive cleaning of exam room while observing appropriate contact time as indicated for disinfecting solutions.   Problem List Items Addressed This Visit    DDD (degenerative disc disease), lumbar   History of total right knee replacement   Chronic pain syndrome   Inflammatory arthritis   Chronic leg pain - Primary    Acute on chronic R leg pain over the last few weeks of unclear cause, associated with new leg weakness with ambulation leading to falls but without numbness or objective weakness. Further evaluate with labwork today, in h/o falls check R leg films. Check D dimer. Discussed possible return to ortho if no cause found.      Relevant Orders   Sedimentation rate   CBC with Differential/Platelet   Uric acid   D-dimer, quantitative (Completed)   CK   DG Tibia/Fibula Right (Completed)   Severe obesity (BMI 35.0-39.9) with comorbidity (Combine)   Spinal stenosis of lumbar region without neurogenic claudication       No orders of the defined types were placed in this encounter.  Orders Placed This Encounter  Procedures  . DG Tibia/Fibula Right    Standing Status:   Future    Number of Occurrences:   1    Standing Expiration Date:   03/01/2022    Order Specific Question:    Reason for Exam (SYMPTOM  OR DIAGNOSIS REQUIRED)    Answer:   right leg pain    Order Specific Question:   Preferred imaging location?    Answer:   [facility]  . Sedimentation rate  . CBC with Differential/Platelet  . Uric acid  . D-dimer, quantitative  . CK    Patient Instructions  Labs today  Let us know if you develop swollen joint with redness and warmth to it.  Continue tylenol, 1000mg  twice daily as needed as well as muscle relaxant  Depending on lab results we may have you return to see orthopedist.    Follow up plan: No follow-ups on file.  [name], MD

## 2021-03-01 NOTE — ED Notes (Signed)
Pt in CT. Will draw labs etc when pt returns.

## 2021-03-01 NOTE — Patient Instructions (Addendum)
Labs today  Let us know if you develop swollen joint with redness and warmth to it.  Continue tylenol, 1000mg  twice daily as needed as well as muscle relaxant  Depending on lab results we may have you return to see orthopedist.

## 2021-03-01 NOTE — ED Notes (Signed)
Pt ambulated in hall with walker, standby assist; pt steady gait with walker, non skid socks on pt, ED MD made aware

## 2021-03-01 NOTE — ED Triage Notes (Signed)
Pt comes into the ED via [facility] from home c/o weakness.  Pt left his MD's office and as he was getting out of the car he fell.  Pt was on the ground for 20 minutes before he was found.  Pt also admits to increasing weakness.  VSS with EMS, unremarkable EKG, 18 g Right forearm. A&Ox4.

## 2021-03-01 NOTE — Telephone Encounter (Signed)
Spoke with pt asking how he is doing.  Still c/o of right leg pain.  Says he was confused about last appt date. Offered to schedule today at 12:30.  Pt agreed.  Having pt added to schedule.

## 2021-03-01 NOTE — ED Provider Notes (Signed)
[facility] Emergency Department Provider Note  ____________________________________________   Event Date/Time   First MD Initiated Contact with Patient 03/01/21 1605     (approximate)  I have reviewed the triage vital signs and the nursing notes.   HISTORY  Chief Complaint Weakness    HPI Nicholas [name] is a 70 y.o. male    with past medical history of recurrent falls due to chronic arthritis of his left leg as well as immobility of his right leg due to previous injuries and stabilization rods, here with fall.  The patient states that over the last week, he has had 2 falls.  He states that when he is getting in and out of his car, his legs buckle.  This is not unusual for him.  He actually just went to his PCP today, was told everything looked okay.  While in the car after the visit, he was trying to get out and his legs buckled and he fell again.  Reports no significant increase in pain after the fall.  He did feel weak, however, and required assistance with getting back up.  Denies any fevers or chills.  No urinary symptoms.  No recent medication changes.  He lives alone.  He is currently being set up with physical therapy by his PCP.  He does not believe he hit his head, though he does not necessarily remember.  He has had some mild lower back pain that is worse over the last several weeks.  No lower extremity weakness or numbness.       Past Medical History:  Diagnosis Date  . Acute kidney failure 08/2008   "cleared up"  no problems since  . Allergy   . Anxiety   . Arthritis   . Asthma    ?of this no inhaler  . Blood transfusion   . CHF (congestive heart failure) ([place])    ?of this, pt denies  . CLOSTRIDIUM DIFFICILE COLITIS 07/04/2010   Annotation: 12/09, 2/10 Qualifier: Diagnosis of  By: [name], [name]    . Depression with anxiety   . Duodenitis determined by biopsy 02/2016   peptic likely due to aleve (erosive gastropathy with duodenal  erosions)  . ECZEMA 07/04/2010   Qualifier: Diagnosis of  By: [name], [name]    . Elevated liver enzymes   . Full dentures   . GERD (gastroesophageal reflux disease)   . Heart attack ([place])    08/2008 (likely demand ischemia in the setting of MSSA sepsis/R TKA  infection)10-2008  . History of hiatal hernia   . History of stomach ulcers   . Hypothyroidism   . Lower GI bleeding   . MALAR AND MAXILLARY BONES CLOSED FRACTURE 07/04/2010   Annotation: ORIF Qualifier: Diagnosis of  By: [name], [name]    . Migraine    "definitely"  . MRSA (methicillin resistant Staphylococcus aureus)    in leg, had to place steel rod in leg  . Personal history of colonic adenoma 06/01/2003  . PFO (patent foramen ovale)    small PFO by 08/2008 TEE  . Pneumonia 08/2008   "while in ICU"  . Seizures ([place]) 2009   "long time ago"    . Sleep apnea    does not wear CPAP  . Stroke [facility]) 2010   unable to complete sentences at times  . Wears glasses     Patient Active Problem List   Diagnosis Date Noted  . Chronic toe ulcer, left, with fat layer  exposed ([place]) 01/23/2021  . Pituitary adenoma ([place]) 09/06/2020  . Chronic intractable headache 07/27/2020  . Vitamin B12 deficiency 03/31/2020  . Vitamin D deficiency 03/31/2020  . Numbness of foot 03/25/2020  . Right hand pain 12/14/2019  . Status post amputation of great toe, left ([place]) 12/14/2019  . Callus of foot 04/02/2019  . Allergy to bee sting 03/11/2019  . Insomnia 02/10/2019  . Status post lumbar spine operative procedure for decompression of spinal cord 12/12/2018  . Dyslipidemia 10/19/2018  . Foot deformity 10/19/2018  . Spinal stenosis of lumbar region without neurogenic claudication 10/01/2018  . Facial rash 05/26/2018  . Status post reverse total replacement of right shoulder 05/13/2018  . CHF (congestive heart failure) ([place]) 12/27/2017  . OSA (obstructive sleep apnea) 12/27/2017  . Health maintenance examination 11/25/2017  . CKD (chronic  kidney disease) stage 3, GFR 30-59 ml/min (HCC) 10/01/2017  . Fatty liver 10/01/2017  . Status post reverse total replacement of left shoulder 09/03/2017  . Bilateral shoulder pain 05/24/2017  . History of CVA in adulthood 03/08/2017  . S/P total knee arthroplasty, left 01/30/2017  . Arthritis of knee 01/15/2017  . Skin rash 12/10/2016  . Medicare annual wellness visit, subsequent 10/05/2016  . Advanced care planning/counseling discussion 10/05/2016  . Severe obesity (BMI 35.0-39.9) with comorbidity ([place]) 07/19/2016  . Hypothyroidism   . Iron deficiency 04/23/2016  . Chronic pain syndrome 04/23/2016  . Inflammatory arthritis 04/23/2016  . Chronic leg pain 04/23/2016  . Lesion of palate 04/23/2016  . Grieving 04/23/2016  . GERD (gastroesophageal reflux disease)   . Duodenitis determined by biopsy 02/13/2016  . Diverticulosis of colon without hemorrhage 01/31/2016  . Chest pain 01/11/2012  . Migraine 01/11/2012  . History of methicillin resistant staphylococcus aureus (MRSA) 07/04/2010  . DEGENERATIVE DISC DISEASE 07/04/2010  . History of total right knee replacement 07/04/2010  . Primary osteoarthritis, right shoulder 06/09/2007  . Personal history of colonic adenoma 06/01/2003    Past Surgical History:  Procedure Laterality Date  . anterior  nerve transposition  07/2009   left ulnar nerve  . antibiotic spacer exchange  11/2008; 08/2006   right knee  . ARTHROTOMY  08/2008   right knee w/I&D  . CARDIOVASCULAR STRESS TEST  2013   stress EKG - negative for ischemia, 4 min 7.3 METs, normal blood pressure response  . CATARACT EXTRACTION    . COLONOSCOPY  11/2012   diverticulosis, rpt 10 yrs [name])  . ESOPHAGOGASTRODUODENOSCOPY  02/2016   erosive gastropathy with duodenal erosions [name])  . HARDWARE REMOVAL  04/2006   right knee w/antibiotic spacers placed  . INGUINAL HERNIA REPAIR  early 1990's   bilateral  . JOINT REPLACEMENT Bilateral   . KNEE ARTHROSCOPY  10/2001    right  . KNEE FUSION  03/2009   right knee removal; antibiotic spacers;   . LEFT HEART CATH AND CORONARY ANGIOGRAPHY N/A 02/04/2018    WNL (Patwardhan, Reynold Bowen, MD)  . LUMBAR LAMINECTOMY/DECOMPRESSION MICRODISCECTOMY N/A 12/05/2018   Procedure: LUMBAR THREE TO FOUR AND LUMBAR FOUR TO FIVE DECOMPRESSION;  Surgeon: [name], MD;  Location: [place];  Service: Orthopedics;  Laterality: N/A;  . MULTIPLE TOOTH EXTRACTIONS    . REPLACEMENT TOTAL KNEE  08/2008; 09/2001   right  . REVERSE SHOULDER ARTHROPLASTY Left 09/03/2017   Procedure: REVERSE SHOULDER ARTHROPLASTY;  Surgeon: Meredith Pel, MD;  Location: [place];  Service: Orthopedics;  Laterality: Left;  . REVERSE SHOULDER ARTHROPLASTY Right 05/13/2018   Procedure: RIGHT REVERSE SHOULDER  ARTHROPLASTY;  Surgeon: Meredith Pel, MD;  Location: [place];  Service: Orthopedics;  Laterality: Right;  . rod placement Right 03/2009   R knee  . SYNOVECTOMY  06/2005   debridement, liner exchange right knee  . TOE AMPUTATION Left 08/2008   great toe - osteomyelitis (staph infection)  . TOE AMPUTATION Left 04/2016   2nd toe [name]  . TONSILLECTOMY    . TOTAL KNEE ARTHROPLASTY Left 01/15/2017  . TOTAL KNEE ARTHROPLASTY Left 01/15/2017   Procedure: TOTAL KNEE ARTHROPLASTY;  Surgeon: Meredith Pel, MD;  Location: [place];  Service: Orthopedics;  Laterality: Left;    Prior to Admission medications   Medication Sig Start Date End Date Taking? Authorizing Provider  acetaminophen (TYLENOL) 500 MG tablet Take 1,000 mg by mouth 2 (two) times daily. IN THE AFTERNOON & AT NIGHT    [provider]  amitriptyline (ELAVIL) 50 MG tablet TAKE 1-2 TABLETS (50-100 MG TOTAL) BY MOUTH AT BEDTIME. 02/29/20   [name], MD  atorvastatin (LIPITOR) 20 MG tablet TAKE 1 TABLET BY MOUTH EVERY DAY 09/27/20   [name], MD  Cholecalciferol (VITAMIN D3) 25 MCG (1000 UT) CAPS Take 1 capsule (1,000 Units total) by mouth daily. Patient not taking:  Reported on 03/01/2021 09/06/20   [name], MD  clotrimazole (LOTRIMIN) 1 % cream Apply 1 application topically 2 (two) times daily. To groin Patient taking differently: Apply 1 application topically 3 (three) times daily. To groin 05/26/18   [name], MD  divalproex (DEPAKOTE ER) 500 MG 24 hr tablet Take 1 tablet (500 mg total) by mouth at bedtime. 01/05/21   Suzzanne Cloud, NP  doxycycline (VIBRA-TABS) 100 MG tablet TAKE 1 TABLET BY MOUTH TWICE A DAY 02/28/21   Felipa Furnace, DPM  EPINEPHrine 0.3 mg/0.3 mL IJ SOAJ injection Inject 0.3 mLs (0.3 mg total) into the muscle as needed for anaphylaxis. (bee stings) 12/14/19   [name], MD  ferrous sulfate 325 (65 FE) MG EC tablet Take 325 mg by mouth every other day.    [provider]  furosemide (LASIX) 20 MG tablet TAKE 1 TABLET BY MOUTH EVERY DAY AS NEEDED FOR FLUID OR EDEMA. 02/17/21   [name], MD  gabapentin (NEURONTIN) 300 MG capsule Take 1 capsule (300 mg total) by mouth in the morning AND 2 capsules (600 mg total) 2 (two) times daily. (lunch and dinner). 06/15/20   [name], MD  ketoconazole (NIZORAL) 2 % shampoo Apply 1 application topically 3 (three) times a week. 12/14/19   [name], MD  levothyroxine (SYNTHROID) 100 MCG tablet TAKE 1 TABLET BY MOUTH DAILY BEFORE BREAKFAST. 01/20/21   [name], MD  metoprolol succinate (TOPROL-XL) 100 MG 24 hr tablet TAKE 1 TABLET BY MOUTH EVERY DAY 01/23/21   [name], MD  nitroGLYCERIN (NITROSTAT) 0.4 MG SL tablet Place 1 tablet (0.4 mg total) under the tongue every 5 (five) minutes as needed for chest pain. 12/14/19   [name], MD  ondansetron (ZOFRAN ODT) 4 MG disintegrating tablet Take 1 tablet (4 mg total) by mouth every 8 (eight) hours as needed. 09/29/20   Marcial Pacas, MD  pantoprazole (PROTONIX) 40 MG tablet TAKE 1 TABLET BY MOUTH EVERY DAY 02/17/21   [name], MD  SUMAtriptan (IMITREX) 100 MG tablet TAKE 1 TAB AS NEEDED  FOR MIGRAINE (REPEAT IN 2 HOURS IF NEEDED). LIMIT TO 2/24 HOUR PERIOD 10/06/19   [name], MD  tiZANidine (ZANAFLEX) 4 MG tablet Take 1 tablet (4 mg  total) by mouth every 6 (six) hours as needed for muscle spasms. 09/29/20   Marcial Pacas, MD  triamcinolone (KENALOG) 0.1 % Apply 1 application topically 2 (two) times daily. Apply to [place]. 12/21/20 12/21/21  [name], MD  Ubrogepant (UBRELVY) 50 MG TABS Take 50 mg by mouth daily as needed. 09/29/20   Marcial Pacas, MD    Allergies Peanut-containing drug products, Yellow jacket venom [bee venom], Celebrex [celecoxib], Percocet [oxycodone-acetaminophen], Latex, and Nsaids  Family History  Problem Relation Age of Onset  . Coronary artery disease Father 47  . Stroke Father   . CAD Mother 56       stents  . Breast cancer Sister   . Diabetes Brother   . Diabetes Sister   . Colon cancer Neg Hx   . Stomach cancer Neg Hx     Social History Social History   Tobacco Use  . Smoking status: Never Smoker  . Smokeless tobacco: Never Used  Vaping Use  . Vaping Use: Never used  Substance Use Topics  . Alcohol use: No    Alcohol/week: 0.0 standard drinks    Comment: "I abused alcohol; last drink  ~ 2005"  . Drug use: No    Review of Systems  Review of Systems  Constitutional: Positive for fatigue. Negative for chills and fever.  HENT: Negative for sore throat.   Respiratory: Negative for shortness of breath.   Cardiovascular: Negative for chest pain.  Gastrointestinal: Negative for abdominal pain.  Genitourinary: Negative for flank pain.  Musculoskeletal: Positive for arthralgias and gait problem. Negative for neck pain.  Skin: Negative for rash and wound.  Allergic/Immunologic: Negative for immunocompromised state.  Neurological: Positive for weakness. Negative for numbness.  Hematological: Does not bruise/bleed easily.  All other systems reviewed and are negative.    ____________________________________________  PHYSICAL  EXAM:      VITAL SIGNS: ED Triage Vitals  Enc Vitals Group     BP 03/01/21 1503 112/73     Pulse Rate 03/01/21 1503 91     Resp 03/01/21 1503 18     Temp 03/01/21 1503 98.4 F (36.9 C)     Temp Source 03/01/21 1503 Oral     SpO2 03/01/21 1503 92 %     Weight 03/01/21 1502 252 lb (114.3 kg)     Height 03/01/21 1502 5\' 8"  (1.727 m)     Head Circumference --      [place] --      Pain Score 03/01/21 1501 8     Pain Loc --      Pain Edu? --      Excl. in [place]? --      Physical Exam Vitals and nursing note reviewed.  Constitutional:      General: He is not in acute distress.    Appearance: He is well-developed.  HENT:     Head: Normocephalic and atraumatic.  Eyes:     Conjunctiva/sclera: Conjunctivae normal.  Cardiovascular:     Rate and Rhythm: Normal rate and regular rhythm.     Heart sounds: Normal heart sounds. No murmur heard. No friction rub.  Pulmonary:     Effort: Pulmonary effort is normal. No respiratory distress.     Breath sounds: Normal breath sounds. No wheezing or rales.  Abdominal:     General: There is no distension.     Palpations: Abdomen is soft.     Tenderness: There is no abdominal tenderness.  Musculoskeletal:     Cervical back: Neck  supple.     Comments: Mild paraspinal lumbar tenderness.  Left lower extremity with mild edema and tenderness about the ankle.  No erythema or warmth.  No fluctuance.  Status post left great and second toe amputation.  Skin:    General: Skin is warm.     Capillary Refill: Capillary refill takes less than 2 seconds.  Neurological:     Mental Status: He is alert and oriented to person, place, and time.     Motor: No abnormal muscle tone.       ____________________________________________   LABS (all labs ordered are listed, but only abnormal results are displayed)  Labs Reviewed  BASIC METABOLIC PANEL - Abnormal; Notable for the following components:      Result Value   Glucose, Bld 104 (*)    Creatinine,  Ser 1.58 (*)    Calcium 8.7 (*)    GFR, Estimated 47 (*)    All other components within normal limits  D-DIMER, QUANTITATIVE - Abnormal; Notable for the following components:   D-Dimer, Quant 3.13 (*)    All other components within normal limits  CBC  CK  URINALYSIS, COMPLETE (UACMP) WITH MICROSCOPIC  TROPONIN I (HIGH SENSITIVITY)  TROPONIN I (HIGH SENSITIVITY)    ____________________________________________  EKG: Normal sinus rhythm, ventricular rate 73.  PR 190, QRS 103, QTc 445.  No acute ST elevations or depressions.  No KG evidence of acute ischemia or infarct. ________________________________________  RADIOLOGY All imaging, including plain films, CT scans, and ultrasounds, independently reviewed by me, and interpretations confirmed via formal radiology reads.  ED MD interpretation:   Ultrasound: Negative Chest x-ray: Clear XR Ankle: negative DG Foot Left: negative  Official radiology report(s): DG Tibia/Fibula Right  Result Date: 03/01/2021 CLINICAL DATA:  Right leg pain EXAM: RIGHT TIBIA AND FIBULA - 2 VIEW COMPARISON:  03/25/2020 FINDINGS: Medullary rod is noted extending from the femur into the tibia with fusion of right knee joint. Significant remodeling in the knee joint is seen. Distal fixation screws are seen. No acute fracture or dislocation is noted. Soft tissues show some varicosities medially. IMPRESSION: Chronic postsurgical changes are noted in the femur and tibia. No acute abnormality is seen. Electronically Signed   By: Inez Catalina M.D.   On: 03/01/2021 19:28   DG Ankle Complete Left  Result Date: 03/01/2021 CLINICAL DATA:  Status post fall. EXAM: LEFT ANKLE COMPLETE - 3+ VIEW COMPARISON:  None. FINDINGS: There is no evidence of an acute fracture, dislocation, or joint effusion. Moderate to marked severity degenerative changes seen involving the proximal and mid left foot. Partial amputation of the left great toe is noted. Mild to moderate severity lateral  soft tissue swelling is seen. IMPRESSION: Lateral soft tissue swelling without an acute osseous abnormality. Electronically Signed   By: Virgina Norfolk M.D.   On: 03/01/2021 17:46   CT Head Wo Contrast  Result Date: 03/01/2021 CLINICAL DATA:  Head trauma.  Fall.  Increasing weakness. EXAM: CT HEAD WITHOUT CONTRAST TECHNIQUE: Contiguous axial images were obtained from the base of the skull through the vertex without intravenous contrast. COMPARISON:  Head MRI 08/30/2020 FINDINGS: Brain: There is no evidence of an acute infarct, intracranial hemorrhage, mass, midline shift, or extra-axial fluid collection. The ventricles and sulci are within normal limits for age. Mild asymmetric prominence of the pituitary gland on the left is grossly similar to the prior MRI which demonstrated a likely microadenoma. Vascular: No hyperdense vessel. Skull: No fracture or suspicious osseous lesion. Sinuses/Orbits: The visualized paranasal  sinuses and mastoid air cells are clear. Bilateral cataract extraction. Other: None. IMPRESSION: No evidence of acute intracranial abnormality. Electronically Signed   By: Logan Bores M.D.   On: 03/01/2021 17:14   CT Lumbar Spine Wo Contrast  Result Date: 03/01/2021 CLINICAL DATA:  Fall.  Low back pain.  Increasing weakness. EXAM: CT LUMBAR SPINE WITHOUT CONTRAST TECHNIQUE: Multidetector CT imaging of the lumbar spine was performed without intravenous contrast administration. Multiplanar CT image reconstructions were also generated. COMPARISON:  Lumbar spine MRI 04/26/2020 FINDINGS: Segmentation: Transitional lumbosacral anatomy with L5 being partially sacralized, which is the same numbering used on the prior MRI. Small ribs at L1. Alignment: Moderate lumbar levoscoliosis. Unchanged minimal retrolisthesis of L2 on L3 and L3 on L4. Vertebrae: No acute fracture or suspicious osseous lesion. L4-5 and L5-S1 interbody ankylosis. Paraspinal and other soft tissues: Postoperative changes in the  posterior lumbar soft tissues with right greater than left posterior paraspinal muscle atrophy. Abdominal aortic atherosclerosis. 4.5 and 3.6 cm right renal cyst. Additional subcentimeter hypodensities in the right kidney, too small to fully characterize but also most likely reflecting cysts. Disc levels: Prior posterior decompression at L3-4 and L4-5. Mild-to-moderate spinal stenosis at L1-2 and moderate spinal stenosis at L2-3 due to disc bulging, endplate spurring, posterior element hypertrophy, and prominent dorsal epidural fat, mildly progressed from the prior MRI. Mild-to-moderate neural foraminal stenosis at L2-3, L3-4, and L4-5, in general worse on the right than the left. Chronic moderate to severe osseous neural foraminal stenosis on the left at L5-S1. IMPRESSION: 1. No acute osseous abnormality identified in the lumbar spine. 2. Moderate lumbar levoscoliosis with disc and facet degeneration resulting in multilevel spinal and neural foraminal stenosis as above. 3. Aortic Atherosclerosis (ICD10-I70.0). Electronically Signed   By: Logan Bores M.D.   On: 03/01/2021 17:11   US Venous Img Lower Bilateral  Result Date: 03/01/2021 CLINICAL DATA:  Lower extremity pain and swelling EXAM: BILATERAL LOWER EXTREMITY VENOUS DOPPLER ULTRASOUND TECHNIQUE: Gray-scale sonography with graded compression, as well as color Doppler and duplex ultrasound were performed to evaluate the lower extremity deep venous systems from the level of the common femoral vein and including the common femoral, femoral, profunda femoral, popliteal and calf veins including the posterior tibial, peroneal and gastrocnemius veins when visible. The superficial great saphenous vein was also interrogated. Spectral Doppler was utilized to evaluate flow at rest and with distal augmentation maneuvers in the common femoral, femoral and popliteal veins. COMPARISON:  None. FINDINGS: RIGHT LOWER EXTREMITY Common Femoral Vein: No evidence of thrombus.  Normal compressibility, respiratory phasicity and response to augmentation. Saphenofemoral Junction: No evidence of thrombus. Normal compressibility and flow on color Doppler imaging. Profunda Femoral Vein: No evidence of thrombus. Normal compressibility and flow on color Doppler imaging. Femoral Vein: No evidence of thrombus. Normal compressibility, respiratory phasicity and response to augmentation. Popliteal Vein: No evidence of thrombus. Normal compressibility, respiratory phasicity and response to augmentation. Calf Veins: No evidence of thrombus. Normal compressibility and flow on color Doppler imaging. Superficial Great Saphenous Vein: No evidence of thrombus. Normal compressibility. Venous Reflux:  None. Other Findings:  None. LEFT LOWER EXTREMITY Common Femoral Vein: No evidence of thrombus. Normal compressibility, respiratory phasicity and response to augmentation. Saphenofemoral Junction: No evidence of thrombus. Normal compressibility and flow on color Doppler imaging. Profunda Femoral Vein: No evidence of thrombus. Normal compressibility and flow on color Doppler imaging. Femoral Vein: No evidence of thrombus. Normal compressibility, respiratory phasicity and response to augmentation. Popliteal Vein: No evidence of thrombus. Normal  compressibility, respiratory phasicity and response to augmentation. Calf Veins: No evidence of thrombus. Normal compressibility and flow on color Doppler imaging. Superficial Great Saphenous Vein: No evidence of thrombus. Normal compressibility. Venous Reflux:  None. Other Findings:  None. IMPRESSION: No evidence of deep venous thrombosis in either lower extremity. Electronically Signed   By: Inez Catalina M.D.   On: 03/01/2021 19:27   DG Chest Portable 1 View  Result Date: 03/01/2021 CLINICAL DATA:  Status post fall. EXAM: PORTABLE CHEST 1 VIEW COMPARISON:  October 17, 2018 FINDINGS: Decreased lung volumes are seen with very mild bibasilar scarring and/or atelectasis.  There is no evidence of a pleural effusion or pneumothorax. The heart size and mediastinal contours are within normal limits. Bilateral radiopaque shoulder replacements are seen. There is limited visualization of the lateral aspects of the lower right ribs. IMPRESSION: Stable exam without acute or active cardiopulmonary disease. Electronically Signed   By: Virgina Norfolk M.D.   On: 03/01/2021 17:44   DG Foot Complete Left  Result Date: 03/01/2021 CLINICAL DATA:  Status post fall. EXAM: LEFT FOOT - COMPLETE 3+ VIEW COMPARISON:  None. FINDINGS: There is prior surgical amputation of the distal phalanx of the left great toe, as well as the middle and distal phalanges of the second left toe. Partial amputation of the proximal phalanges of the first and second left toes is also noted. There is no evidence of an acute fracture or dislocation. Moderate to marked severity degenerative changes are seen within the proximal and mid left foot. Mild to moderate severity soft tissue swelling is noted along the lateral aspect of the left ankle. IMPRESSION: Chronic, degenerative and postoperative changes without an acute osseous abnormality. Electronically Signed   By: Virgina Norfolk M.D.   On: 03/01/2021 17:54    ____________________________________________  PROCEDURES   Procedure(s) performed (including Critical Care):  Procedures  ____________________________________________  INITIAL IMPRESSION / MDM / [place] / ED COURSE  As part of my medical decision making, I reviewed the following data within the [place] notes reviewed and incorporated, Old chart reviewed, Notes from prior ED visits, and [place] Controlled Substance Database       *MONDARIUS LANSON was evaluated in Emergency Department on 03/01/2021 for the symptoms described in the history of present illness. He was evaluated in the context of the global COVID-19 pandemic, which necessitated consideration that  the patient might be at risk for infection with the SARS-CoV-2 virus that causes COVID-19. Institutional protocols and algorithms that pertain to the evaluation of patients at risk for COVID-19 are in a state of rapid change based on information released by regulatory bodies including the CDC and federal and state organizations. These policies and algorithms were followed during the patient's care in the ED.  Some ED evaluations and interventions may be delayed as a result of limited staffing during the pandemic.*     Medical Decision Making: 70 year old male here with generalized weakness after fall.  Patient has a history of recurrent falls due to severe arthritis and weakness in his legs.  Clinically, patient appears overall in no distress.  He has no focal neurological deficits.  Screening lab work overall is unremarkable.  CBC and BMP at baseline.  No significant anemia or leukocytosis.  EKG is nonischemic and troponin is negative, do not suspect weakness from ACS or cardiac etiology.  CK negative.  Patient does have a positive D-dimer, which was obtained secondary to his left lower extremity swelling.  Ultrasound  obtained and is negative.  CT of the head and spine showed no acute fracture or malalignment.  Plain films of the left ankle are negative.  Chest x-ray without pneumonia.  Patient feels back to baseline and is tolerating p.o.  He is amatory in the ED without difficulty.  Will discharge with instructions to call his PCP as I suspect he would benefit significantly from outpatient PT.  Otherwise, fall and return precautions given.  ____________________________________________  FINAL CLINICAL IMPRESSION(S) / ED DIAGNOSES  Final diagnoses:  Physical deconditioning  Left ankle swelling  Fall, initial encounter     MEDICATIONS GIVEN DURING THIS VISIT:  Medications  sodium chloride 0.9 % bolus 1,000 mL (0 mLs Intravenous Stopped 03/01/21 1924)     ED Discharge Orders    None        Note:  This document was prepared using Dragon voice recognition software and may include unintentional dictation errors.   Duffy Bruce, MD 03/01/21 2012

## 2021-03-01 NOTE — ED Notes (Addendum)
Pt to ED for recent fall today. Has had 2 recent falls while getting into car, after has already been up and walking around. First fall was about 1 week ago and second fall was today. Pt had seen HCP today to be evaluated for increasing bilateral leg weakness. States that legs have been getting weaker and they "give out" on him and he falls. Uses cane and walker at home. Denies dizziness, any unilateral numbness, N/V/D, CP and SOB. Denies urinary symptoms. Pt alert and oriented on RA.   Pt also concerned because when has BMs, about halfway through BM "it sprays everywhere". First part of BM is normal then it becomes liquid.

## 2021-03-02 ENCOUNTER — Encounter: Payer: Self-pay | Admitting: Family Medicine

## 2021-03-02 ENCOUNTER — Telehealth: Payer: Self-pay

## 2021-03-02 DIAGNOSIS — M79604 Pain in right leg: Secondary | ICD-10-CM

## 2021-03-02 DIAGNOSIS — R29898 Other symptoms and signs involving the musculoskeletal system: Secondary | ICD-10-CM

## 2021-03-02 DIAGNOSIS — G8929 Other chronic pain: Secondary | ICD-10-CM

## 2021-03-02 LAB — CBC WITH DIFFERENTIAL/PLATELET
Basophils Absolute: 0.1 10*3/uL (ref 0.0–0.1)
Basophils Relative: 0.7 % (ref 0.0–3.0)
Eosinophils Absolute: 0.3 10*3/uL (ref 0.0–0.7)
Eosinophils Relative: 3.8 % (ref 0.0–5.0)
HCT: 43.8 % (ref 39.0–52.0)
Hemoglobin: 14.5 g/dL (ref 13.0–17.0)
Lymphocytes Relative: 19.6 % (ref 12.0–46.0)
Lymphs Abs: 1.7 10*3/uL (ref 0.7–4.0)
MCHC: 33.2 g/dL (ref 30.0–36.0)
MCV: 96.6 fl (ref 78.0–100.0)
Monocytes Absolute: 1.3 10*3/uL — ABNORMAL HIGH (ref 0.1–1.0)
Monocytes Relative: 15 % — ABNORMAL HIGH (ref 3.0–12.0)
Neutro Abs: 5.2 10*3/uL (ref 1.4–7.7)
Neutrophils Relative %: 60.9 % (ref 43.0–77.0)
Platelets: 215 10*3/uL (ref 150.0–400.0)
RBC: 4.54 Mil/uL (ref 4.22–5.81)
RDW: 15.1 % (ref 11.5–15.5)
WBC: 8.6 10*3/uL (ref 4.0–10.5)

## 2021-03-02 LAB — SEDIMENTATION RATE: Sed Rate: 28 mm/hr — ABNORMAL HIGH (ref 0–20)

## 2021-03-02 LAB — CK: Total CK: 98 U/L (ref 7–232)

## 2021-03-02 LAB — D-DIMER, QUANTITATIVE: D-Dimer, Quant: 1.64 mcg/mL FEU — ABNORMAL HIGH (ref <0.50)

## 2021-03-02 LAB — URIC ACID: Uric Acid, Serum: 8.8 mg/dL — ABNORMAL HIGH (ref 4.0–7.8)

## 2021-03-02 NOTE — Telephone Encounter (Signed)
Attempted to contact pt.  No answer.  Vm box full.  Need to relay x-ray results from yesterday's OV, Dr. Synthia Innocent message and see if pt agrees to PT and ortho referral.  Results/Dr. Synthia Innocent msg: Plz notify leg xray returned reassuringly ok. I saw he was seen at ER yesterday after another fall, other xrays and CT scans obtained looked ok, labs stable except for kidney function was a bit worse - recommend increase water intake for this. Venous ultrasound bilaterally returned ok as well.  Would offer PT referral and ortho referral for further evaluation of progressive weakness.

## 2021-03-02 NOTE — Telephone Encounter (Signed)
Noted  

## 2021-03-02 NOTE — Telephone Encounter (Signed)
[place] Night - Client TELEPHONE ADVICE RECORD AccessNurse Patient Name: Nicholas [name] Gender: Male DOB: [birth year] Age: 70 Y 3 M 13 D Return Phone Number: Address: City/ State/ Zip: [place] [address] Client [place] Night - Client Client Site [facility] Physician [name] - MD Contact Type Call Who Is Calling Lab Lab Name [facility] Phone Number [phone] Lab Tech Name Shirlean Mylar Lab Reference Number Doren Custard [id number] S Chief Complaint Lab Result (Critical or Stat) Call Type Lab Send to RN Reason for Call Report lab results Initial Comment Caller is from [place] with a critical lab results Translation No Nurse Assessment Nurse: Harlow Mares, RN, Suanne Marker Date/Time (Eastern Time): 03/02/2021 7:34:56 AM Is there an on-call provider listed? ---Yes Please list name of person reporting value (Lab Employee) and a contact number. ---[phone]/ [facility] / Sharyn Lull Please document the following items: Lab name Lab value (read back to lab to verify) Reference range for lab value Date and time blood was drawn Collect time of birth for bilirubin results ---D-Dimer: high 1.64 03/01/21 @ 2pm Please collect the patient contact information from the lab. (name, phone number and address) ---[phone] Disp. Time Eilene Ghazi Time) Disposition Final User 03/02/2021 7:38:41 AM Paged On Call back to [facility], [place], Suanne Marker 03/02/2021 7:51:42 AM Clinical Call Yes Harlow Mares, RN, [facility] Phone DateTime Result/ Outcome Message Type Notes Deborra Medina - MD [phone] 03/02/2021 7:38:41 AM Paged On Call Back to Call Center Doctor Paged PLEASE NOTE: All timestamps contained within this report are represented as [place] Standard Time. CONFIDENTIALTY NOTICE: This fax transmission is intended only for the addressee. It contains information that is legally privileged,  confidential or otherwise protected from use or disclosure. If you are not the intended recipient, you are strictly prohibited from reviewing, disclosing, copying using or disseminating any of this information or taking any action in reliance on or regarding this information. If you have received this fax in error, please notify us immediately by telephone so that we can arrange for its return to [place]. Phone: [phone], Toll-Free: [phone], Fax: [phone] Page: 2 of 2 Call Id: [id number] Paging DoctorName Phone DateTime Result/ Outcome Message Type Notes Deborra Medina - MD 03/02/2021 7:51:35 AM Spoke with On Call - General Message Result Dr. Derrel Nip asked that the lab result be faxed to the office for Dr. Danise Mina for review; she is currently driving.

## 2021-03-02 NOTE — Assessment & Plan Note (Signed)
Acute on chronic R leg pain over the last few weeks of unclear cause, associated with new leg weakness with ambulation leading to falls but without numbness or objective weakness. Further evaluate with labwork today, in h/o falls check R leg films. Check D dimer. Discussed possible return to ortho if no cause found.

## 2021-03-02 NOTE — Telephone Encounter (Signed)
Per chart review tab pt seen at [facility] ED on 03/01/21 and was noted in ED note that pt had + d dimer. Sending note to Dr Darnell Level who is out of office, Dr Damita Dunnings who is in office and Lattie Haw CMA.

## 2021-03-02 NOTE — Telephone Encounter (Signed)
Noted that u/s was done-  IMPRESSION: No evidence of deep venous thrombosis in either lower extremity.  I will defer to PCP.  Thanks.

## 2021-03-03 NOTE — Telephone Encounter (Signed)
Spoke with pt relaying results and Dr. Synthia Innocent message.  Pt verbalizes understanding and agrees to PT and ortho referrals.  Says he doing ok, just a little sore.

## 2021-03-04 NOTE — Addendum Note (Signed)
Addended by: [name] on: 03/04/2021 11:25 AM   Modules accepted: Orders

## 2021-03-04 NOTE — Telephone Encounter (Signed)
PT and ortho referrals placed.

## 2021-03-10 ENCOUNTER — Other Ambulatory Visit: Payer: Self-pay | Admitting: Family Medicine

## 2021-03-10 NOTE — Telephone Encounter (Signed)
Amitriptyline Last filled: 12/07/20, #180 Last OV:  03/01/21, R leg pain Next OV:  08/18/21, AWV prt 2

## 2021-03-20 ENCOUNTER — Encounter: Payer: Self-pay | Admitting: Orthopedic Surgery

## 2021-03-20 ENCOUNTER — Ambulatory Visit (INDEPENDENT_AMBULATORY_CARE_PROVIDER_SITE_OTHER): Payer: Medicare Other | Admitting: Orthopedic Surgery

## 2021-03-20 ENCOUNTER — Ambulatory Visit: Payer: Medicare Other | Admitting: Family Medicine

## 2021-03-20 DIAGNOSIS — M25561 Pain in right knee: Secondary | ICD-10-CM

## 2021-03-20 DIAGNOSIS — M79604 Pain in right leg: Secondary | ICD-10-CM | POA: Diagnosis not present

## 2021-03-20 NOTE — Progress Notes (Signed)
Office Visit Note   Patient: Nicholas [name]           Date of Birth: 02/11/1951           MRN: [id number] Visit Date: 03/20/2021 Requested by: [name], MD [address] [place],  [place] [address] PCP: [name], MD  Subjective: Chief Complaint  Patient presents with   Right Leg - Pain    HPI: Nicholas [name] is a 70 y.o. male who presents to the office complaining of bilateral leg pain.  Patient states that he had 3 separate falls and increased his right knee pain about a week ago.  Upon chart review it seems that patient had multiple falls with emergency department visit on 03/01/2021.  He complains of continued right knee pain that radiates to his foot with no improvement or worsening since the injury.  He states it "about the same".  He does have some difficulty weightbearing but he is able to weight-bear though he needs to use a walker.  Denies any increase in back pain, thigh pain, groin pain.  Not much pain originating from the ankle by his description, with most of it originating from the right knee and radiating downward.  Denies calf pain..  Denies any fevers or chills.  Does not feel septic like he did about 10 years ago.              ROS: All systems reviewed are negative as they relate to the chief complaint within the history of present illness.  Patient denies fevers or chills.  Assessment & Plan: Visit Diagnoses:  1. Tenderness of right knee   2. Pain in right leg     Plan: Patient is a 70 year old male who presents complaining of primarily right leg pain with radiation from the knee down into the ankle.  He has had multiple falls with negative radiographs at his last emergency department visit on 03/01/2021.  These radiographs were compared from previous radiographs from about 11 months ago with no significant change.  However he does endorse increased difficulty weightbearing and increased pain compared with baseline compared with prior to his falls.   With lack of radiographic findings and continued pain and tenderness on exam, plan to order CT scan of the right knee for further evaluation.  For possible occult fracture or other abnormality.  Follow-up after scan to review results.  Follow-Up Instructions: No follow-ups on file.   Orders:  Orders Placed This Encounter  Procedures   CT KNEE RIGHT WO CONTRAST   No orders of the defined types were placed in this encounter.     Procedures: No procedures performed   Clinical Data: No additional findings.  Objective: Vital Signs: There were no vitals taken for this visit.  Physical Exam:  Constitutional: Patient appears well-developed HEENT:  Head: Normocephalic Eyes:EOM are normal Neck: Normal range of motion Cardiovascular: Normal rate Pulmonary/chest: Effort normal Neurologic: Patient is alert Skin: Skin is warm Psychiatric: Patient has normal mood and affect  Ortho Exam: Ortho exam demonstrates right knee locked in extension from prior procedure.  Well-healed incision.  No effusion is present.  No significantly increased warmth around the right knee.  He does have general tenderness throughout the right knee but no tenderness through the tibial shaft, calf, right ankle/foot.  No pain with logroll of the right hip.  Mild venous stasis changes noted in the distal right extremity but no pitting edema.  Specialty Comments:  No specialty comments available.  Imaging: No results found.   PMFS History: Patient Active Problem List   Diagnosis Date Noted   Chronic toe ulcer, left, with fat layer exposed ([place]) 01/23/2021   Pituitary adenoma ([place]) 09/06/2020   Chronic intractable headache 07/27/2020   Vitamin B12 deficiency 03/31/2020   Vitamin D deficiency 03/31/2020   Numbness of foot 03/25/2020   Right hand pain 12/14/2019   Status post amputation of great toe, left ([place]) 12/14/2019   Callus of foot 04/02/2019   Allergy to bee sting 03/11/2019   Insomnia 02/10/2019    Status post lumbar spine operative procedure for decompression of spinal cord 12/12/2018   Dyslipidemia 10/19/2018   Foot deformity 10/19/2018   Spinal stenosis of lumbar region without neurogenic claudication 10/01/2018   Facial rash 05/26/2018   Status post reverse total replacement of right shoulder 05/13/2018   CHF (congestive heart failure) ([place]) 12/27/2017   OSA (obstructive sleep apnea) 12/27/2017   Health maintenance examination 11/25/2017   CKD (chronic kidney disease) stage 3, GFR 30-59 ml/min ([place]) 10/01/2017   Fatty liver 10/01/2017   Status post reverse total replacement of left shoulder 09/03/2017   Bilateral shoulder pain 05/24/2017   History of CVA in adulthood 03/08/2017   S/P total knee arthroplasty, left 01/30/2017   Arthritis of knee 01/15/2017   Skin rash 12/10/2016   Medicare annual wellness visit, subsequent 10/05/2016   Advanced care planning/counseling discussion 10/05/2016   Severe obesity (BMI 35.0-39.9) with comorbidity ([place]) 07/19/2016   Hypothyroidism    Iron deficiency 04/23/2016   Chronic pain syndrome 04/23/2016   Inflammatory arthritis 04/23/2016   Chronic leg pain 04/23/2016   Lesion of palate 04/23/2016   Grieving 04/23/2016   GERD (gastroesophageal reflux disease)    Duodenitis determined by biopsy 02/13/2016   Diverticulosis of colon without hemorrhage 01/31/2016   Chest pain 01/11/2012   Migraine 01/11/2012   History of methicillin resistant staphylococcus aureus (MRSA) 07/04/2010   DDD (degenerative disc disease), lumbar 07/04/2010   History of total right knee replacement 07/04/2010   Primary osteoarthritis, right shoulder 06/09/2007   Personal history of colonic adenoma 06/01/2003   Past Medical History:  Diagnosis Date   Acute kidney failure 08/2008   "cleared up"  no problems since   Allergy    Anxiety    Arthritis    Asthma    ?of this no inhaler   Blood transfusion    CHF (congestive heart failure) ([place])    ?of this, pt  denies   CLOSTRIDIUM DIFFICILE COLITIS 07/04/2010   Annotation: 12/09, 2/10 Qualifier: Diagnosis of  By: [name], [name]     Depression with anxiety    Duodenitis determined by biopsy 02/2016   peptic likely due to aleve (erosive gastropathy with duodenal erosions)   ECZEMA 07/04/2010   Qualifier: Diagnosis of  By: [name], [name]     Elevated liver enzymes    Full dentures    GERD (gastroesophageal reflux disease)    Heart attack ([place])    08/2008 (likely demand ischemia in the setting of MSSA sepsis/R TKA  infection)10-2008   History of hiatal hernia    History of stomach ulcers    Hypothyroidism    Lower GI bleeding    MALAR AND MAXILLARY BONES CLOSED FRACTURE 07/04/2010   Annotation: ORIF Qualifier: Diagnosis of  By: [name], [name]     Migraine    "definitely"   MRSA (methicillin resistant Staphylococcus aureus)    in leg, had to place steel rod in leg  Personal history of colonic adenoma 06/01/2003   PFO (patent foramen ovale)    small PFO by 08/2008 TEE   Pneumonia 08/2008   "while in ICU"   Seizures ([place]) 2009   "long time ago"     Sleep apnea    does not wear CPAP   Stroke ([place]) 2010   unable to complete sentences at times   Wears glasses     Family History  Problem Relation Age of Onset   Coronary artery disease Father 68   Stroke Father    CAD Mother 63       stents   Breast cancer Sister    Diabetes Brother    Diabetes Sister    Colon cancer Neg Hx    Stomach cancer Neg Hx     Past Surgical History:  Procedure Laterality Date   anterior  nerve transposition  07/2009   left ulnar nerve   antibiotic spacer exchange  11/2008; 08/2006   right knee   ARTHROTOMY  08/2008   right knee w/I&D   CARDIOVASCULAR STRESS TEST  2013   stress EKG - negative for ischemia, 4 min 7.3 METs, normal blood pressure response   CATARACT EXTRACTION     COLONOSCOPY  11/2012   diverticulosis, rpt 10 yrs [name])   ESOPHAGOGASTRODUODENOSCOPY  02/2016   erosive gastropathy  with duodenal erosions [name])   HARDWARE REMOVAL  04/2006   right knee w/antibiotic spacers placed   INGUINAL HERNIA REPAIR  early 1990's   bilateral   JOINT REPLACEMENT Bilateral    KNEE ARTHROSCOPY  10/2001   right   KNEE FUSION  03/2009   right knee removal; antibiotic spacers;    LEFT HEART CATH AND CORONARY ANGIOGRAPHY N/A 02/04/2018    WNL (Patwardhan, Reynold Bowen, MD)   LUMBAR LAMINECTOMY/DECOMPRESSION MICRODISCECTOMY N/A 12/05/2018   Procedure: LUMBAR THREE TO FOUR AND LUMBAR FOUR TO FIVE DECOMPRESSION;  Surgeon: [name], MD;  Location: [facility];  Service: Orthopedics;  Laterality: N/A;   MULTIPLE TOOTH EXTRACTIONS     REPLACEMENT TOTAL KNEE  08/2008; 09/2001   right   REVERSE SHOULDER ARTHROPLASTY Left 09/03/2017   Procedure: REVERSE SHOULDER ARTHROPLASTY;  Surgeon: Meredith Pel, MD;  Location: [facility];  Service: Orthopedics;  Laterality: Left;   REVERSE SHOULDER ARTHROPLASTY Right 05/13/2018   Procedure: RIGHT REVERSE SHOULDER ARTHROPLASTY;  Surgeon: Meredith Pel, MD;  Location: [facility];  Service: Orthopedics;  Laterality: Right;   rod placement Right 03/2009   R knee   SYNOVECTOMY  06/2005   debridement, liner exchange right knee   TOE AMPUTATION Left 08/2008   great toe - osteomyelitis (staph infection)   TOE AMPUTATION Left 04/2016   2nd toe [name]   TONSILLECTOMY     TOTAL KNEE ARTHROPLASTY Left 01/15/2017   TOTAL KNEE ARTHROPLASTY Left 01/15/2017   Procedure: TOTAL KNEE ARTHROPLASTY;  Surgeon: Meredith Pel, MD;  Location: [facility];  Service: Orthopedics;  Laterality: Left;   Social History   Occupational History   Occupation: retired  Tobacco Use   Smoking status: Never Smoker   Smokeless tobacco: Never Used  Scientific laboratory technician Use: Never used  Substance and Sexual Activity   Alcohol use: No    Alcohol/week: 0.0 standard drinks    Comment: "I abused alcohol; last drink  ~ 2005"   Drug use: No   Sexual activity: Never

## 2021-03-21 ENCOUNTER — Ambulatory Visit: Payer: Medicare Other

## 2021-03-22 ENCOUNTER — Telehealth: Payer: Self-pay | Admitting: Family Medicine

## 2021-03-22 NOTE — Chronic Care Management (AMB) (Signed)
  Chronic Care Management   Note  03/22/2021 Name: Nicholas [name] MRN: [id number] DOB: Feb 15, 1951  Nicholas [name] is a 70 y.o. year old male who is a primary care patient of [name], MD. I reached out to [name] by phone today in response to a referral sent by [name]'s PCP, [name], MD.   [name] was given information about Chronic Care Management services today including:  1. CCM service includes personalized support from designated clinical staff supervised by his physician, including individualized plan of care and coordination with other care providers 2. 24/7 contact phone numbers for assistance for urgent and routine care needs. 3. Service will only be billed when office clinical staff spend 20 minutes or more in a month to coordinate care. 4. Only one practitioner may furnish and bill the service in a calendar month. 5. The patient may stop CCM services at any time (effective at the end of the month) by phone call to the office staff.   Patient agreed to services and verbal consent obtained.   Follow up plan:   Tatjana Secretary/administrator

## 2021-03-23 ENCOUNTER — Telehealth: Payer: Self-pay

## 2021-03-23 NOTE — Chronic Care Management (AMB) (Addendum)
Chronic Care Management Pharmacy Assistant   Name: Nicholas [name]  MRN: [id number] DOB: Aug 09, 1951  Nicholas [name] is an 70 y.o. year old male who presents for his initial CCM visit with the clinical pharmacist.  Reason for Encounter: Initial Questions   Conditions to be addressed/monitored: CHF and CKD Stage 3  Recent office visits: 03/01/21 - Dr.Gutierrez, PCP- Chronic pain follow up, Continue tylenol, 1000mg  twice daily as needed as well as muscle relaxant. Depending on lab results we may have you return to see orthopedist.  02/14/21 - Dr.Gutierrez, PCP- AWV, Restart monthly B12 injections, Restart Vitamin D 1000 IU daily.  01/09/21 - Webb Silversmith, NP - Pain left toe, Foot exam completed. Ketorolac injection given. 12/21/20 - Dr.Gutierrez, PCP -  Skin rash, started Triamcinolone 0.1% cream apply topically 2 times a day for 1-2 weeks max   Recent consult visits: 03/20/21 - Orthopedic Surgery - CT scan 02/03/21 - Podiatry - No medication changes 01/11/21 - Podiatry - Started doxycycline 100mg  take 1 tablet 2 times daily 01/05/21 - Neurology - Chronic migraine headache follow up, continue current medications  09/29/20 - Neurology - Chronic migraine headache, Start Depakote ER 500 mg take 1 tablet at bedtime    Hospital visits:  03/01/21 - ED, frequent falls, physical deconditioning, recommend outpatient PT   Medications: Outpatient Encounter Medications as of 03/23/2021  Medication Sig   acetaminophen (TYLENOL) 500 MG tablet Take 1,000 mg by mouth 2 (two) times daily. IN THE AFTERNOON & AT NIGHT   amitriptyline (ELAVIL) 50 MG tablet TAKE 1-2 TABLETS BY MOUTH AT BEDTIME.   atorvastatin (LIPITOR) 20 MG tablet TAKE 1 TABLET BY MOUTH EVERY DAY   Cholecalciferol (VITAMIN D3) 25 MCG (1000 UT) CAPS Take 1 capsule (1,000 Units total) by mouth daily. (Patient not taking: Reported on 03/01/2021)   clotrimazole (LOTRIMIN) 1 % cream Apply 1 application topically 2 (two) times daily. To groin (Patient  taking differently: Apply 1 application topically 3 (three) times daily. To groin)   divalproex (DEPAKOTE ER) 500 MG 24 hr tablet Take 1 tablet (500 mg total) by mouth at bedtime.   doxycycline (VIBRA-TABS) 100 MG tablet TAKE 1 TABLET BY MOUTH TWICE A DAY   EPINEPHrine 0.3 mg/0.3 mL IJ SOAJ injection Inject 0.3 mLs (0.3 mg total) into the muscle as needed for anaphylaxis. (bee stings)   ferrous sulfate 325 (65 FE) MG EC tablet Take 325 mg by mouth every other day.   furosemide (LASIX) 20 MG tablet TAKE 1 TABLET BY MOUTH EVERY DAY AS NEEDED FOR FLUID OR EDEMA.   gabapentin (NEURONTIN) 300 MG capsule Take 1 capsule (300 mg total) by mouth in the morning AND 2 capsules (600 mg total) 2 (two) times daily. (lunch and dinner).   ketoconazole (NIZORAL) 2 % shampoo Apply 1 application topically 3 (three) times a week.   levothyroxine (SYNTHROID) 100 MCG tablet TAKE 1 TABLET BY MOUTH DAILY BEFORE BREAKFAST.   metoprolol succinate (TOPROL-XL) 100 MG 24 hr tablet TAKE 1 TABLET BY MOUTH EVERY DAY   nitroGLYCERIN (NITROSTAT) 0.4 MG SL tablet Place 1 tablet (0.4 mg total) under the tongue every 5 (five) minutes as needed for chest pain.   ondansetron (ZOFRAN ODT) 4 MG disintegrating tablet Take 1 tablet (4 mg total) by mouth every 8 (eight) hours as needed.   pantoprazole (PROTONIX) 40 MG tablet TAKE 1 TABLET BY MOUTH EVERY DAY   SUMAtriptan (IMITREX) 100 MG tablet TAKE 1 TAB AS NEEDED FOR MIGRAINE (REPEAT IN 2 HOURS  IF NEEDED). LIMIT TO 2/24 HOUR PERIOD   tiZANidine (ZANAFLEX) 4 MG tablet Take 1 tablet (4 mg total) by mouth every 6 (six) hours as needed for muscle spasms.   triamcinolone (KENALOG) 0.1 % Apply 1 application topically 2 (two) times daily. Apply to AA.   Ubrogepant (UBRELVY) 50 MG TABS Take 50 mg by mouth daily as needed.   No facility-administered encounter medications on file as of 03/23/2021.     Lab Results  Component Value Date/Time   HGBA1C 5.6 07/13/2010 08:12 PM   HGBA1C  12/08/2008  04:33 PM    5.2 (NOTE)   The ADA recommends the following therapeutic goal for glycemic   control related to Hgb A1C measurement:   Goal of Therapy:   < 7.0% Hgb A1C   Reference: American Diabetes Association: Clinical Practice   Recommendations 2008, Diabetes Care,  2008, 31:(Suppl 1).     BP Readings from Last 3 Encounters:  03/01/21 121/84  03/01/21 120/72  02/14/21 120/80    Have you seen any other providers since your last visit with PCP? Yes, see chart review  Any changes in your medications or health? No  Any side effects from any medications? No  Do you have an symptoms or problems not managed by your medications? No  Any concerns about your health right now? Yes - the patient reports he continues to have unsteady balance issues, he has falls.  Has your provider asked that you check blood pressure, blood sugar, or follow special diet at home? No  Do you get any type of exercise on a regular basis? No due to balance concerns  Can you think of a goal you would like to reach for your health? No  Do you have any problems getting your medications? No The patient states he has a zero copay at the [facility] and can drive through to pick up meds  Is there anything that you would like to discuss during the appointment? No   Nicholas [name] was reminded to have all medications, supplements and any blood glucose and blood pressure readings available for review with [name], Pharm. D, at his telephone visit on 03/24/2021 at 11:00 AM.    Star Rating Drugs Medication:  Last Fill: Day Supply Atorvastatin 20mg  12/25/20 90  Follow-Up:  Pharmacist Review  [name], CPP notified  [name] [facility] Clinical Pharmacy Assistant [phone]  I have reviewed the care management and care coordination activities outlined in this encounter and I am certifying that I agree with the content of this note. No further action required.  [name], PharmD Clinical  Pharmacist [facility] [phone]

## 2021-03-24 ENCOUNTER — Telehealth: Payer: Self-pay

## 2021-03-24 ENCOUNTER — Ambulatory Visit (INDEPENDENT_AMBULATORY_CARE_PROVIDER_SITE_OTHER): Payer: Medicare Other

## 2021-03-24 ENCOUNTER — Other Ambulatory Visit: Payer: Self-pay

## 2021-03-24 DIAGNOSIS — G43109 Migraine with aura, not intractable, without status migrainosus: Secondary | ICD-10-CM

## 2021-03-24 DIAGNOSIS — G894 Chronic pain syndrome: Secondary | ICD-10-CM

## 2021-03-24 DIAGNOSIS — R296 Repeated falls: Secondary | ICD-10-CM

## 2021-03-24 DIAGNOSIS — E785 Hyperlipidemia, unspecified: Secondary | ICD-10-CM | POA: Diagnosis not present

## 2021-03-24 DIAGNOSIS — I509 Heart failure, unspecified: Secondary | ICD-10-CM | POA: Diagnosis not present

## 2021-03-24 DIAGNOSIS — E039 Hypothyroidism, unspecified: Secondary | ICD-10-CM

## 2021-03-24 DIAGNOSIS — L97522 Non-pressure chronic ulcer of other part of left foot with fat layer exposed: Secondary | ICD-10-CM

## 2021-03-24 DIAGNOSIS — M5136 Other intervertebral disc degeneration, lumbar region: Secondary | ICD-10-CM

## 2021-03-24 DIAGNOSIS — G8929 Other chronic pain: Secondary | ICD-10-CM

## 2021-03-24 MED ORDER — ATORVASTATIN CALCIUM 20 MG PO TABS: 1.0000 | ORAL_TABLET | Freq: Every day | ORAL | 3 refills | Status: DC

## 2021-03-24 MED ORDER — VITAMIN D3 25 MCG (1000 UT) PO CAPS: 1.0000 | ORAL_CAPSULE | Freq: Every day | ORAL | 1 refills | Status: DC

## 2021-03-24 NOTE — Chronic Care Management (AMB) (Signed)
    Chronic Care Management Pharmacy Assistant   Name: CHASKA HAGGER  MRN: [id number] DOB: 08-Feb-1951  Reason for Encounter: Onboard Process for [facility]   Medications: Outpatient Encounter Medications as of 03/24/2021  Medication Sig   acetaminophen (TYLENOL) 500 MG tablet Take 1,000 mg by mouth 2 (two) times daily. IN THE AFTERNOON & AT NIGHT   amitriptyline (ELAVIL) 50 MG tablet TAKE 1-2 TABLETS BY MOUTH AT BEDTIME.   atorvastatin (LIPITOR) 20 MG tablet TAKE 1 TABLET BY MOUTH EVERY DAY   Cholecalciferol (VITAMIN D3) 25 MCG (1000 UT) CAPS Take 1 capsule (1,000 Units total) by mouth daily. (Patient not taking: Reported on 03/24/2021)   clotrimazole (LOTRIMIN) 1 % cream Apply 1 application topically 2 (two) times daily. To groin (Patient taking differently: Apply 1 application topically 3 (three) times daily. To groin)   divalproex (DEPAKOTE ER) 500 MG 24 hr tablet Take 1 tablet (500 mg total) by mouth at bedtime. (Patient not taking: Reported on 03/24/2021)   EPINEPHrine 0.3 mg/0.3 mL IJ SOAJ injection Inject 0.3 mLs (0.3 mg total) into the muscle as needed for anaphylaxis. (bee stings)   ferrous sulfate 325 (65 FE) MG EC tablet Take 325 mg by mouth every other day.   furosemide (LASIX) 20 MG tablet TAKE 1 TABLET BY MOUTH EVERY DAY AS NEEDED FOR FLUID OR EDEMA.   gabapentin (NEURONTIN) 300 MG capsule Take 1 capsule (300 mg total) by mouth in the morning AND 2 capsules (600 mg total) 2 (two) times daily. (lunch and dinner).   ketoconazole (NIZORAL) 2 % shampoo Apply 1 application topically 3 (three) times a week.   levothyroxine (SYNTHROID) 100 MCG tablet TAKE 1 TABLET BY MOUTH DAILY BEFORE BREAKFAST.   metoprolol succinate (TOPROL-XL) 100 MG 24 hr tablet TAKE 1 TABLET BY MOUTH EVERY DAY   nitroGLYCERIN (NITROSTAT) 0.4 MG SL tablet Place 1 tablet (0.4 mg total) under the tongue every 5 (five) minutes as needed for chest pain.   ondansetron (ZOFRAN ODT) 4 MG disintegrating tablet Take 1 tablet  (4 mg total) by mouth every 8 (eight) hours as needed.   pantoprazole (PROTONIX) 40 MG tablet TAKE 1 TABLET BY MOUTH EVERY DAY   SUMAtriptan (IMITREX) 100 MG tablet TAKE 1 TAB AS NEEDED FOR MIGRAINE (REPEAT IN 2 HOURS IF NEEDED). LIMIT TO 2/24 HOUR PERIOD (Patient not taking: Reported on 03/24/2021)   tiZANidine (ZANAFLEX) 4 MG tablet Take 1 tablet (4 mg total) by mouth every 6 (six) hours as needed for muscle spasms.   triamcinolone (KENALOG) 0.1 % Apply 1 application topically 2 (two) times daily. Apply to AA.   Ubrogepant (UBRELVY) 50 MG TABS Take 50 mg by mouth daily as needed.   No facility-administered encounter medications on file as of 03/24/2021.   Following appointment with [name] on 03/24/21 patient requested to onboard with [facility]. Medication count  and onboarding form was completed. Contacted [facility] to coordinate patient's medications for delivery, med sync, and adherence packaging from [facility]. Refills for Gabapentin, Atorvastatin and Vitamin D were requested from provider. Patient needs the above medications 03/27/21.   Follow-Up:  Comptroller and Pharmacist Review  [name], CPP notified  Margaretmary Dys, [place] Pharmacy Assistant [phone]

## 2021-03-24 NOTE — Progress Notes (Signed)
Chronic Care Management Pharmacy Note  03/24/2021 Name:  Nicholas [name] MRN:  [id number] DOB:  23-Dec-1950  Subjective: Nicholas [name] is an 70 y.o. year old male who is a primary patient of [name], MD.  The CCM team was consulted for assistance with disease management and care coordination needs.    Engaged with patient by telephone for initial visit in response to provider referral for pharmacy case management and/or care coordination services.   -Primary health concern is frequent falls. Reports has fell multiple times in the past month, even when using walker. He fell once this week. Reports multiple falls prior weeks. No injuries. Reports need for food delivery and home health resources.  -Reports for the month of May he was in bed for extended period of time and did not take his medications for an entire month. Reports he didn't eat and lost a lot of weight (239 lbs current). Reports the falls disoriented him and he did not want to get out of bed. A nurse/friend came over and helped him get back on medicines and eat.   Consent to Services:  The patient was given the following information about Chronic Care Management services today, agreed to services, and gave verbal consent: 1. CCM service includes personalized support from designated clinical staff supervised by the primary care provider, including individualized plan of care and coordination with other care providers 2. 24/7 contact phone numbers for assistance for urgent and routine care needs. 3. Service will only be billed when office clinical staff spend 20 minutes or more in a month to coordinate care. 4. Only one practitioner may furnish and bill the service in a calendar month. 5.The patient may stop CCM services at any time (effective at the end of the month) by phone call to the office staff. 6. The patient will be responsible for cost sharing (co-pay) of up to 20% of the service fee (after annual deductible is met).  Patient agreed to services and consent obtained.  Patient Care Team: [name], MD as PCP - General (Family Medicine) Adrian Prows, MD (Cardiology) [name], [facility] as Pharmacist (Pharmacist)  Recent office visits: 03/01/21 - Dr.Gutierrez, PCP- Chronic pain follow up, Continue tylenol, 1043m twice daily as needed as well as muscle relaxant. Depending on lab results we may have you return to see orthopedist.  02/14/21 - Dr.Gutierrez, PCP- AWV, Restart monthly B12 injections, Restart Vitamin D 1000 IU daily.  01/09/21 - RWebb Silversmith NP - Pain left toe, Foot exam completed. Ketorolac injection given. 12/21/20 - Dr.Gutierrez, PCP -  Skin rash, started Triamcinolone 0.1% cream apply topically 2 times a day for 1-2 weeks max    Recent consult visits: 03/20/21 - Orthopedic Surgery - CT scan  02/03/21 - Podiatry - No medication changes 01/11/21 - Podiatry - Started doxycycline 1032mtake 1 tablet 2 times daily 01/05/21 - Neurology - Chronic migraine headache follow up, continue current medications  09/29/20 - Neurology - Chronic migraine headache, Start Depakote ER 500 mg take 1 tablet at bedtime    Hospital visits:  03/01/21 - ED, frequent falls, physical deconditioning, recommend outpatient PT    Objective:  Lab Results  Component Value Date   CREATININE 1.58 (H) 03/01/2021   BUN 15 03/01/2021   GFR 53.84 (L) 12/08/2020   GFRNONAA 47 (L) 03/01/2021   GFRAA >60 12/05/2018   NA 139 03/01/2021   K 4.2 03/01/2021   CALCIUM 8.7 (L) 03/01/2021   CO2 24 03/01/2021   GLUCOSE 104 (  H) 03/01/2021    Lab Results  Component Value Date/Time   HGBA1C 5.6 07/13/2010 08:12 PM   HGBA1C  12/08/2008 04:33 PM    5.2 (NOTE)   The ADA recommends the following therapeutic goal for glycemic   control related to Hgb A1C measurement:   Goal of Therapy:   < 7.0% Hgb A1C   Reference: American Diabetes Association: Clinical Practice   Recommendations 2008, Diabetes Care,  2008, 31:(Suppl 1).   GFR 53.84  (L) 12/08/2020 02:32 PM   GFR 62.00 08/03/2020 03:40 PM    Lab Results  Component Value Date   CHOL 137 12/08/2020   HDL 42.30 12/08/2020   LDLCALC 59 12/08/2020   LDLDIRECT 60.0 12/11/2019   TRIG 176.0 (H) 12/08/2020   CHOLHDL 3 12/08/2020    Hepatic Function Latest Ref Rng & Units 12/08/2020 08/03/2020 06/15/2020  Total Protein 6.0 - 8.3 g/dL 6.8 6.7 7.0  Albumin 3.5 - 5.2 g/dL 4.0 4.1 -  AST 0 - 37 U/L 16 21 -  ALT 0 - 53 U/L 12 16 -  Alk Phosphatase 39 - 117 U/L 86 89 -  Total Bilirubin 0.2 - 1.2 mg/dL 0.9 2.1(H) -  Bilirubin, Direct 0.0 - 0.3 mg/dL - - -    Lab Results  Component Value Date/Time   TSH 3.22 12/08/2020 02:32 PM   TSH 2.93 09/06/2020 09:56 AM   FREET4 0.91 09/06/2020 09:56 AM   FREET4 0.87 03/23/2020 11:08 AM    CBC Latest Ref Rng & Units 03/01/2021 03/01/2021 12/08/2020  WBC 4.0 - 10.5 K/uL 7.9 8.6 8.3  Hemoglobin 13.0 - 17.0 g/dL 14.2 14.5 15.1  Hematocrit 39.0 - 52.0 % 42.8 43.8 44.2  Platelets 150 - 400 K/uL 201 215.0 222.0    Lab Results  Component Value Date/Time   VD25OH 25.83 (L) 12/08/2020 02:32 PM   VD25OH 45.00 06/15/2020 11:09 AM    Clinical ASCVD: Yes - CVA The 10-year ASCVD risk score Mikey Bussing DC Jr., et al., 2013) is: 17.1%   Values used to calculate the score:     Age: 59 years     Sex: Male     Is Non-Hispanic African American: No     Diabetic: No     Tobacco smoker: No     Systolic Blood Pressure: 465 mmHg     Is BP treated: Yes     HDL Cholesterol: 42.3 mg/dL     Total Cholesterol: 137 mg/dL    Depression screen [facility] 2/9 02/14/2021 12/11/2019 12/10/2018  Decreased Interest 0 0 0  Down, Depressed, Hopeless 0 0 0  PHQ - 2 Score 0 0 0  Altered sleeping - 0 0  Tired, decreased energy - 0 0  Change in appetite - 0 0  Feeling bad or failure about yourself  - 0 0  Trouble concentrating - 0 0  Moving slowly or fidgety/restless - 0 0  Suicidal thoughts - 0 0  PHQ-9 Score - 0 0  Difficult doing work/chores - Not difficult at all Not  difficult at all  Some recent data might be hidden    Social History   Tobacco Use  Smoking Status Never  Smokeless Tobacco Never   BP Readings from Last 3 Encounters:  03/01/21 121/84  03/01/21 120/72  02/14/21 120/80   Pulse Readings from Last 3 Encounters:  03/01/21 70  03/01/21 70  02/14/21 63   Wt Readings from Last 3 Encounters:  03/01/21 252 lb (114.3 kg)  03/01/21 252 lb 6 oz (  114.5 kg)  02/14/21 256 lb 2 oz (116.2 kg)   BMI Readings from Last 3 Encounters:  03/01/21 38.32 kg/m  03/01/21 38.37 kg/m  02/14/21 38.94 kg/m    Assessment/Interventions: Review of patient past medical history, allergies, medications, health status, including review of consultants reports, laboratory and other test data, was performed as part of comprehensive evaluation and provision of chronic care management services.   SDOH:  (Social Determinants of Health) assessments and interventions performed: Yes SDOH Interventions    Flowsheet Row Most Recent Value  SDOH Interventions   Financial Strain Interventions Intervention Not Indicated      SDOH Screenings   Alcohol Screen: Not on file  Depression (PHQ2-9): Low Risk    PHQ-2 Score: 0  Financial Resource Strain: Low Risk    Difficulty of Paying Living Expenses: Not very hard  Food Insecurity: Not on file  Housing: Not on file  Physical Activity: Not on file  Social Connections: Not on file  Stress: Not on file  Tobacco Use: Low Risk    Smoking Tobacco Use: Never   Smokeless Tobacco Use: Never  Transportation Needs: Not on file    CCM Care Plan  Allergies  Allergen Reactions   Peanut-Containing Drug Products Anaphylaxis and Dermatitis   Yellow Jacket Venom [Bee Venom] Anaphylaxis and Other (See Comments)    Respiratory Distress   Celebrex [Celecoxib] Other (See Comments)    BLACK STOOL?MELENA?   Percocet [Oxycodone-Acetaminophen] Hives, Swelling and Other (See Comments)   Latex Rash   Nsaids Rash     Medications Reviewed Today     Reviewed by [name], [facility] (Pharmacist) on 03/24/21 at 1157  Med List Status: <None>   Medication Order Taking? Sig Documenting Provider Last Dose Status Informant  acetaminophen (TYLENOL) 500 MG tablet [id number] Yes Take 1,000 mg by mouth 2 (two) times daily. IN THE AFTERNOON & AT NIGHT [provider] Taking Active Self  amitriptyline (ELAVIL) 50 MG tablet [id number] Yes TAKE 1-2 TABLETS BY MOUTH AT BEDTIME. [name], MD Taking Active   atorvastatin (LIPITOR) 20 MG tablet [id number] Yes TAKE 1 TABLET BY MOUTH EVERY DAY [name], MD Taking Active   Cholecalciferol (VITAMIN D3) 25 MCG (1000 UT) CAPS [id number] No Take 1 capsule (1,000 Units total) by mouth daily.  Patient not taking: Reported on 03/24/2021   [name], MD Not Taking Active   clotrimazole (LOTRIMIN) 1 % cream [id number]  Apply 1 application topically 2 (two) times daily. To groin  Patient taking differently: Apply 1 application topically 3 (three) times daily. To groin   [name], MD  Active Self  divalproex (DEPAKOTE ER) 500 MG 24 hr tablet [id number] No Take 1 tablet (500 mg total) by mouth at bedtime.  Patient not taking: Reported on 03/24/2021   Suzzanne Cloud, NP Not Taking Active Self  EPINEPHrine 0.3 mg/0.3 mL IJ SOAJ injection [id number]  Inject 0.3 mLs (0.3 mg total) into the muscle as needed for anaphylaxis. (bee stings) [name], MD  Active   ferrous sulfate 325 (65 FE) MG EC tablet [id number] Yes Take 325 mg by mouth every other day. [provider] Taking Active   furosemide (LASIX) 20 MG tablet [id number] Yes TAKE 1 TABLET BY MOUTH EVERY DAY AS NEEDED FOR FLUID OR EDEMA. [name], MD Taking Active   gabapentin (NEURONTIN) 300 MG capsule [id number] Yes Take 1 capsule (300 mg total) by mouth in the morning AND 2 capsules (600 mg total) 2 (two) times daily. (lunch and dinner).  [name], MD Taking Active    ketoconazole (NIZORAL) 2 % shampoo [id number]  Apply 1 application topically 3 (three) times a week. [name], MD  Active   levothyroxine (SYNTHROID) 100 MCG tablet [id number] Yes TAKE 1 TABLET BY MOUTH DAILY BEFORE BREAKFAST. [name], MD Taking Active   metoprolol succinate (TOPROL-XL) 100 MG 24 hr tablet [id number] Yes TAKE 1 TABLET BY MOUTH EVERY DAY [name], MD Taking Active   nitroGLYCERIN (NITROSTAT) 0.4 MG SL tablet [id number]  Place 1 tablet (0.4 mg total) under the tongue every 5 (five) minutes as needed for chest pain. [name], MD  Active   ondansetron (ZOFRAN ODT) 4 MG disintegrating tablet [id number] Yes Take 1 tablet (4 mg total) by mouth every 8 (eight) hours as needed. Marcial Pacas, MD Taking Active   pantoprazole (PROTONIX) 40 MG tablet [id number] Yes TAKE 1 TABLET BY MOUTH EVERY DAY [name], MD Taking Active   SUMAtriptan (IMITREX) 100 MG tablet [id number] No TAKE 1 TAB AS NEEDED FOR MIGRAINE (REPEAT IN 2 HOURS IF NEEDED). LIMIT TO 2/24 HOUR PERIOD  Patient not taking: Reported on 03/24/2021   [name], MD Not Taking Consider Medication Status and Discontinue   tiZANidine (ZANAFLEX) 4 MG tablet [id number] Yes Take 1 tablet (4 mg total) by mouth every 6 (six) hours as needed for muscle spasms. Marcial Pacas, MD Taking Active   triamcinolone (KENALOG) 0.1 % [id number]  Apply 1 application topically 2 (two) times daily. Apply to AA. [name], MD  Active   Ubrogepant [facility]) 50 MG TABS [id number] Yes Take 50 mg by mouth daily as needed. Marcial Pacas, MD Taking Active             Patient Active Problem List   Diagnosis Date Noted   Chronic toe ulcer, left, with fat layer exposed ([place]) 01/23/2021   Pituitary adenoma ([place]) 09/06/2020   Chronic intractable headache 07/27/2020   Vitamin B12 deficiency 03/31/2020   Vitamin D deficiency 03/31/2020   Numbness of foot 03/25/2020   Right hand pain 12/14/2019   Status post amputation  of great toe, left ([place]) 12/14/2019   Callus of foot 04/02/2019   Allergy to bee sting 03/11/2019   Insomnia 02/10/2019   Status post lumbar spine operative procedure for decompression of spinal cord 12/12/2018   Dyslipidemia 10/19/2018   Foot deformity 10/19/2018   Spinal stenosis of lumbar region without neurogenic claudication 10/01/2018   Facial rash 05/26/2018   Status post reverse total replacement of right shoulder 05/13/2018   CHF (congestive heart failure) ([place]) 12/27/2017   OSA (obstructive sleep apnea) 12/27/2017   Health maintenance examination 11/25/2017   CKD (chronic kidney disease) stage 3, GFR 30-59 ml/min ([place]) 10/01/2017   Fatty liver 10/01/2017   Status post reverse total replacement of left shoulder 09/03/2017   Bilateral shoulder pain 05/24/2017   History of CVA in adulthood 03/08/2017   S/P total knee arthroplasty, left 01/30/2017   Arthritis of knee 01/15/2017   Skin rash 12/10/2016   Medicare annual wellness visit, subsequent 10/05/2016   Advanced care planning/counseling discussion 10/05/2016   Severe obesity (BMI 35.0-39.9) with comorbidity ([place]) 07/19/2016   Hypothyroidism    Iron deficiency 04/23/2016   Chronic pain syndrome 04/23/2016   Inflammatory arthritis 04/23/2016   Chronic leg pain 04/23/2016   Lesion of palate 04/23/2016   Grieving 04/23/2016   GERD (gastroesophageal reflux disease)    Duodenitis determined by biopsy 02/13/2016   Diverticulosis of colon without hemorrhage 01/31/2016   Chest pain  01/11/2012   Migraine 01/11/2012   History of methicillin resistant staphylococcus aureus (MRSA) 07/04/2010   DDD (degenerative disc disease), lumbar 07/04/2010   History of total right knee replacement 07/04/2010   Primary osteoarthritis, right shoulder 06/09/2007   Personal history of colonic adenoma 06/01/2003    Immunization History  Administered Date(s) Administered   Influenza, High Dose Seasonal PF 08/15/2017   Influenza,inj,Quad PF,6+  Mos 07/01/2013   Influenza-Unspecified 08/16/2011, 07/18/2016, 06/03/2018   Pneumococcal Conjugate-13 10/05/2016   Pneumococcal Polysaccharide-23 08/16/2011, 02/14/2021   Td 01/30/2006   Zoster Recombinat (Shingrix) 12/12/2017, 04/22/2018   Zoster, Live 10/15/2012    Conditions to be addressed/monitored:  Hyperlipidemia, Heart Failure, GERD, Chronic Kidney Disease, Hypothyroidism, and Migraine  Care Plan : CCM PharmacyCare Plan  Updates made by [name], [place] since 03/24/2021 12:00 AM     Problem: CHL AMB "PATIENT-SPECIFIC PROBLEM"      Long-Range Goal: Disease Management   Start Date: 03/24/2021  Priority: High  Note:     Current Barriers:  Unable to self administer medications as prescribed Gaps in adherence   Pharmacist Clinical Goal(s):  Patient will achieve ability to self administer medications as prescribed through use of adherence packaging as evidenced by patient report through collaboration with PharmD and provider.   Interventions: 1:1 collaboration with [name], MD regarding development and update of comprehensive plan of care as evidenced by provider attestation and co-signature Inter-disciplinary care team collaboration (see longitudinal plan of care) Comprehensive medication review performed; medication list updated in electronic medical record  Hyperlipidemia: (LDL goal < 70) -Controlled (Appropriate, effective, safe, affordable)- LDL 59 -Current treatment: Atorvastatin 20 mg daily -Medications previously tried: none  -Educated on Cholesterol goals;  -Recommended to continue current medication  CHF (Goal: Prevent exacerbations) -Controlled (Appropriate, effective, Query safe) - patient denies swelling, but frequent falls and very limited fluid intake add to risk of Lasix use.  -Diastolic HF, EF 58% 0998 -Pt reports limited fluid intake and frequent falls. He does not monitor home BP. He was evaluated in ED without abnormal findings 03/01/21,  however, he reports recent weight loss. I think he should see PCP.  -Current treatment  Lasix 20 mg daily  -Medications previously tried: none   -Recommended increase fluid intake. Schedule visit with PCP for evaluation given weight loss.   Insomnia Controlled (Appropriate, effective, Query safe) - per patient report, sleeping well May contribute to fall risk, may also prevent migraines Current treatment  Amitriptyline 50 mg daily at bedtime for sleep -Medications previously tried: Melatonin Recommended continue current medications   Chronic Migraines -Followed by neurology  -Patient not taking medication as prescribed. Reports he was off all meds for 30 days last month. Also states he uses Depakote as needed for migraines. Uses Ubrelvy after Depakote. Repeats dose as needed. He is not clear on the directions for Ubrelvy or Depakote.  -Reports ~2 weeks since last migraine, reports migraines have been stable at 1 every 2-3 weeks. Reports his migraines are primarily heat related.  -Current treatment  Preventative:  Depakote ER 500 mg - 1 tablet daily at bedtime  Metoprolol succinate 100 mg - 1 tablet daily  Gabapentin 300 mg - 1 capsule BF, 2 lunch, 2 bedtime  Abortive agent: Ubrelvy 50 mg - 1 tablet daily as needed for migraine  As needed: Tizanidine, Zofran Reviewed directions for Ubrelvy - 50 as a single dose; if symptoms persist or return, may repeat dose after ?2 hours. Maximum: 200 mg per 24 hours. Discussed Depakote indication as  preventative, not as needed. Avoid PRN use. Since patient has been off therapy, do not recommend resumption at this time without neurology appointment. Recommend patient schedule a follow up visit with neuro.  -Medications previously tried: Sumatriptan 100 mg (not effective), Tylenol -Will send update to neurology - patient off Depakote but denies increase in migraine frequency.  GERD (Goal: Control symptoms) -Unable to assess today -Current treatment   Pantoprazole 40 mg daily -Medications previously tried: none reported  -Recommended - eval at next visit  Hypothyroidism (Goal: TSH WNL) -Controlled -Current treatment  Levothyroxine 100 mcg daily -Medications previously tried: none reported  -Recommended to continue current medication  Patient Goals/Self-Care Activities Patient will:  - focus on medication adherence by utilizing adherence packs   Follow Up Plan: Telephone follow up appointment with care management team member scheduled for: 30 days   Medication Assistance: None required.  Patient affirms current coverage meets needs.      Compliance/Adherence/Medication fill history: Care Gaps: COVID-19 vaccine TDAP vaccine  Star-Rating Drugs: Medication:                Last Fill:         Day Supply Atorvastatin 78m       12/25/20            90  Patient[facility] pharmacy [facility]  [id number] [place] [place]: [phone]: [phone] pill box? No - reports someone tried to use a pillbox, but states this did not work well.  He takes all of his medications in the morning except 1 at bedtime.   Reports the following meds: Atorvastatin - 1 every morning Levothyroxine - 1 every morning Tizanidine - 1 tablet every 6 hours (taking once daily) Furosemide - 1 every morning  Pantoprazole - 1 every morning  Gabapentin - 1 every morning and 2 in afternoon, 2 at bedtime  Amitriptyline - 2 at bedtime  Metoprolol - 1 at bedtime  Depakote - taking as needed (did not know where his bottle is located) Ferrous sulfate 325 mg - 1 every other day in morning  Vitamin D3 25 mcg - 1 daily  Vitamin B12 Injection - Once monthly   We discussed: Benefits of medication synchronization, packaging and delivery as well as enhanced pharmacist oversight with [facility]. Patient decided to: Utilize [facility] for medication synchronization, packaging and  delivery  Verbal consent obtained for [facility] enhanced pharmacy services (medication synchronization, adherence packaging, delivery coordination). A medication sync plan was created to allow patient to get all medications delivered once every 30 to 90 days per patient preference. Patient understands they have freedom to choose pharmacy and clinical pharmacist will coordinate care between all prescribers and [facility].  Care Plan and Follow Up Patient Decision:  Patient agrees to Care Plan and Follow-up.   MiDebbora DusPharmD Clinical Pharmacist [phone]

## 2021-03-24 NOTE — Telephone Encounter (Signed)
Patient had visit with CCM pharmacist today - Would like to update neurology that patient not taking Depakote as prescribed. Patient reports he is taking Depakote as needed for migraines instead of daily. He denies any increase in frequency of migraines (still having 1 every 2-3 weeks). He was asked to discontinue use as needed and follow up with neurology if any worsening of migraines. He was instructed on appropriate use of Ubrelvy as needed.  [name], PharmD Clinical Pharmacist [facility] [phone]

## 2021-03-24 NOTE — Telephone Encounter (Signed)
Gabapentin Last rx:  06/15/20, #450 Last OV:  03/01/21, R leg pain Next OV:  08/18/21, 6 mo f/u

## 2021-03-24 NOTE — Patient Instructions (Signed)
March 24, 2021  Dear [name],  It was a pleasure meeting you during our initial appointment on March 24, 2021. Below is a summary of the goals we discussed and components of chronic care management. Please contact me anytime with questions or concerns.   Visit Information  Patient Care Plan: CCM PharmacyCare Plan     Problem Identified: CHL AMB "PATIENT-SPECIFIC PROBLEM"      Long-Range Goal: Disease Management   Start Date: 03/24/2021  Priority: High  Note:     Current Barriers:  Unable to self administer medications as prescribed Gaps in adherence   Pharmacist Clinical Goal(s):  Patient will achieve ability to self administer medications as prescribed through use of adherence packaging as evidenced by patient report through collaboration with PharmD and provider.   Interventions: 1:1 collaboration with [name], MD regarding development and update of comprehensive plan of care as evidenced by provider attestation and co-signature Inter-disciplinary care team collaboration (see longitudinal plan of care) Comprehensive medication review performed; medication list updated in electronic medical record  Hyperlipidemia: (LDL goal < 70) -Controlled (Appropriate, effective, safe, affordable)- LDL 59 -Current treatment: Atorvastatin 20 mg daily -Medications previously tried: none  -Educated on Cholesterol goals;  -Recommended to continue current medication  CHF (Goal: Prevent exacerbations) -Controlled (Appropriate, effective, Query safe) - patient denies swelling, but frequent falls and very limited fluid intake add to risk of Lasix use.  -Diastolic HF, EF 28% 4132 -Pt reports limited fluid intake and frequent falls. He does not monitor home BP. He was evaluated in ED without abnormal findings 03/01/21, however, he reports recent weight loss. I think he should see PCP.  -Current treatment  Lasix 20 mg daily  -Medications previously tried: none   -Recommended  increase fluid intake. Schedule visit with PCP for evaluation given weight loss.   Insomnia Controlled (Appropriate, effective, Query safe) - per patient report, sleeping well May contribute to fall risk, may also prevent migraines Current treatment  Amitriptyline 50 mg daily at bedtime for sleep -Medications previously tried: Melatonin Recommended continue current medications   Chronic Migraines -Followed by neurology  -Patient not taking medication as prescribed. Reports he was off all meds for 30 days last month. Also states he uses Depakote as needed for migraines. Uses Ubrelvy after Depakote. Repeats dose as needed. He is not clear on the directions for Ubrelvy or Depakote.  -Reports ~2 weeks since last migraine, reports migraines have been stable at 1 every 2-3 weeks. Reports his migraines are primarily heat related.  -Current treatment  Preventative:  Depakote ER 500 mg - 1 tablet daily at bedtime  Metoprolol succinate 100 mg - 1 tablet daily  Gabapentin 300 mg - 1 capsule BF, 2 lunch, 2 bedtime  Abortive agent: Ubrelvy 50 mg - 1 tablet daily as needed for migraine  As needed: Tizanidine, Zofran Reviewed directions for Ubrelvy - 50 as a single dose; if symptoms persist or return, may repeat dose after ?2 hours. Maximum: 200 mg per 24 hours. Discussed Depakote indication as preventative, not as needed. Avoid PRN use. Since patient has been off therapy, do not recommend resumption at this time without neurology appointment. Recommend patient schedule a follow up visit with neuro.  -Medications previously tried: Sumatriptan 100 mg (not effective), Tylenol -Will send update to neurology - patient off Depakote but denies increase in migraine frequency.  GERD (Goal: Control symptoms) -Unable to assess today -Current treatment  Pantoprazole 40 mg daily -Medications previously tried: none reported  -Recommended - eval  at next visit  Hypothyroidism (Goal: TSH  WNL) -Controlled -Current treatment  Levothyroxine 100 mcg daily -Medications previously tried: none reported  -Recommended to continue current medication  Patient Goals/Self-Care Activities Patient will:  - focus on medication adherence by utilizing adherence packs   Follow Up Plan: Telephone follow up appointment with care management team member scheduled for: 30 days   Medication Assistance: None required.  Patient affirms current coverage meets needs.     Mr. Buhl was given information about Chronic Care Management services today including:  CCM service includes personalized support from designated clinical staff supervised by his physician, including individualized plan of care and coordination with other care providers 24/7 contact phone numbers for assistance for urgent and routine care needs. Standard insurance, coinsurance, copays and deductibles apply for chronic care management only during months in which we provide at least 20 minutes of these services. Most insurances cover these services at 100%, however patients may be responsible for any copay, coinsurance and/or deductible if applicable. This service may help you avoid the need for more expensive face-to-face services. Only one practitioner may furnish and bill the service in a calendar month. The patient may stop CCM services at any time (effective at the end of the month) by phone call to the office staff.  Patient agreed to services and verbal consent obtained.   The patient verbalized understanding of instructions, educational materials, and care plan provided today and agreed to receive a mailed copy of patient instructions, educational materials, and care plan.  The pharmacy team will reach out to the patient again over the next 30  days.   Verbal consent obtained for [facility] enhanced pharmacy services (medication synchronization, adherence packaging, delivery coordination). A medication sync plan was  created to allow patient to get all medications delivered once every 30 to 90 days per patient preference. Patient understands they have freedom to choose pharmacy and clinical pharmacist will coordinate care between all prescribers and [facility].  [name], PharmD Clinical Pharmacist [facility] at [facility] [phone]

## 2021-03-24 NOTE — Telephone Encounter (Signed)
-----   Message from [name] sent at 03/24/2021  2:40 PM EDT ----- Regarding: Refills Patent [facility] to [facility] from [facility] and needs refills on Atorvastatin, Vitamin D and Gabapentin if appropriate. Please send to [facility]. Thank you for all of your help!  Margaretmary Dys, [place] Pharmacy Assistant [phone]

## 2021-03-24 NOTE — Addendum Note (Signed)
Addended by: Randall An on: 03/24/2021 05:13 PM   Modules accepted: Orders

## 2021-03-24 NOTE — Telephone Encounter (Signed)
Summary:  During CCM call today, patient reported he stayed in bed for the month of May due to falls. Reports multiple falls in the previous weeks. Reports no food intake during this time and did not take any of his medications for 30 days. He states a friend (who is also a Marine scientist) came over and helped him get back on his feet and resume his meds at the start of June. He reports weight loss (current 239 lbs) and very minimal appetite. He would like resources for food delivery services and home health. States PT has not started.   Recommendations: Schedule PCP visit - will route to front office to contact patient Referral to CCM social worker - will need PCP to place referral Adherence packaging/delivery through [facility]  [name], PharmD Clinical Pharmacist [facility] at [facility] [phone]

## 2021-03-24 NOTE — Telephone Encounter (Signed)
Noted. Thank you for calling him.  Agree with CCM SW referral. Nicholas [name] can you place this for me? Thanks.

## 2021-03-25 NOTE — Telephone Encounter (Signed)
Referral signed.

## 2021-03-25 NOTE — Addendum Note (Signed)
Addended by: [name] on: 03/25/2021 07:27 PM   Modules accepted: Orders

## 2021-03-27 ENCOUNTER — Telehealth: Payer: Self-pay | Admitting: Family Medicine

## 2021-03-27 NOTE — Telephone Encounter (Signed)
   Telephone encounter was:  Unsuccessful.  03/27/2021 Name: Nicholas [name] MRN: [id number] DOB: September 24, 1951  Unsuccessful outbound call made today to assist with:  Transportation Needs , Food Insecurity, and social activities.  Outreach Attempt:  1st Attempt  Pt voicemail was full and I was unable to leave a message.   April Green Care Guide, Embedded Care Coordination [place], Care Management Phone: [phone] Email: [email or web address]

## 2021-03-29 MED ORDER — GABAPENTIN 300 MG PO CAPS: ORAL_CAPSULE | ORAL | 1 refills | Status: DC

## 2021-03-29 NOTE — Telephone Encounter (Signed)
Called patient twice to schedule. NO answer and unable to leave a message due to vm being full. EM

## 2021-03-29 NOTE — Telephone Encounter (Signed)
Reviewed medication sync plan to coordinate pill packaging. Estimated sync and packaging date 06/10/21.

## 2021-03-29 NOTE — Telephone Encounter (Signed)
   Telephone encounter was:  Successful.  03/29/2021 Name: Nicholas [name] MRN: [id number] DOB: [birth year]  [name] is a 70 y.o. year old male who is a primary care patient of [name], MD . The community resource team was consulted for assistance with Transportation Needs  and [place] guide performed the following interventions: Patient provided with information about care guide support team and interviewed to confirm resource needs Discussed resources to assist with transportation with son, [name]. I advised I haven't been able to reach his father and he will reach out and have him call me .  Follow Up Plan:  Care guide will follow up with patient by phone over the next few days.  April Green Care Guide, Embedded Care Coordination [place], Care Management Phone: [phone] Email: [email or web address]

## 2021-03-29 NOTE — Telephone Encounter (Signed)
Nicholas [name] is calling in stating he is interested in setting up an appointment.

## 2021-03-30 ENCOUNTER — Telehealth: Payer: Self-pay | Admitting: Family Medicine

## 2021-03-30 NOTE — Telephone Encounter (Signed)
   Telephone encounter was:  Successful.  03/30/2021 Name: Nicholas [name] MRN: [id number] DOB: 09-28-1951  [name] is a 70 y.o. year old male who is a primary care patient of [name], MD . The community resource team was consulted for assistance with Transportation Needs  and [place] guide performed the following interventions: Patient provided with information about care guide support team and interviewed to confirm resource needs Discussed resources to assist with transportation and food insecurity. Pt denied needing help stating he still drives and is able to get to where he needs to go. He has Medicaid so he said he has food every month. I advised if he feels like he can't drive or is running out of food at the end of the month to please reach out to his doctor and they will place another referral .  Follow Up Plan:  No further follow up planned at this time. The patient has been provided with needed resources.  April Green Care Guide, Embedded Care Coordination , Care Management Phone: [phone] Email: [email or web address]

## 2021-04-01 ENCOUNTER — Ambulatory Visit
Admission: RE | Admit: 2021-04-01 | Discharge: 2021-04-01 | Disposition: A | Discharge status: Home / Self Care | Payer: Medicare Other | Source: Ambulatory Visit | Attending: Orthopedic Surgery | Admitting: Orthopedic Surgery

## 2021-04-01 DIAGNOSIS — I8391 Asymptomatic varicose veins of right lower extremity: Secondary | ICD-10-CM | POA: Diagnosis not present

## 2021-04-01 DIAGNOSIS — M25561 Pain in right knee: Secondary | ICD-10-CM | POA: Diagnosis not present

## 2021-04-01 DIAGNOSIS — M79604 Pain in right leg: Secondary | ICD-10-CM

## 2021-04-01 DIAGNOSIS — Z981 Arthrodesis status: Secondary | ICD-10-CM | POA: Diagnosis not present

## 2021-04-04 ENCOUNTER — Telehealth: Payer: Self-pay | Admitting: Orthopedic Surgery

## 2021-04-04 NOTE — Telephone Encounter (Signed)
Called patient's sister Jonita Albee listed as one of patient's contacts. Asked is she could try to contact patient and have him call me back for an appointment with Dr [name] Sa

## 2021-04-04 NOTE — Telephone Encounter (Signed)
Called patient got recording mailbox is full   could not leave message    will try again later

## 2021-04-05 ENCOUNTER — Other Ambulatory Visit: Payer: Self-pay | Admitting: Family Medicine

## 2021-04-05 NOTE — Telephone Encounter (Signed)
Sent to [facility]

## 2021-04-06 ENCOUNTER — Telehealth: Payer: Self-pay

## 2021-04-06 NOTE — Chronic Care Management (AMB) (Signed)
    Chronic Care Management Pharmacy Assistant   Name: Nicholas [name]  MRN: [id number] DOB: 06-Feb-1951   Reason for Encounter: Disease State  Recent office visits:  None since last CCM visit  Recent consult visits:  None since last CCM visit  Hospital visits:  03/01/21 ED visit. No admission  Medications: Outpatient Encounter Medications as of 04/06/2021  Medication Sig   acetaminophen (TYLENOL) 500 MG tablet Take 1,000 mg by mouth 2 (two) times daily. IN THE AFTERNOON & AT NIGHT   amitriptyline (ELAVIL) 50 MG tablet TAKE 1-2 TABLETS BY MOUTH AT BEDTIME.   atorvastatin (LIPITOR) 20 MG tablet Take 1 tablet (20 mg total) by mouth daily.   Cholecalciferol (VITAMIN D3) 25 MCG (1000 UT) CAPS Take 1 capsule (1,000 Units total) by mouth daily.   clotrimazole (LOTRIMIN) 1 % cream Apply 1 application topically 2 (two) times daily. To groin (Patient taking differently: Apply 1 application topically 3 (three) times daily. To groin)   divalproex (DEPAKOTE ER) 500 MG 24 hr tablet Take 1 tablet (500 mg total) by mouth at bedtime. (Patient not taking: Reported on 03/24/2021)   EPINEPHrine 0.3 mg/0.3 mL IJ SOAJ injection Inject 0.3 mLs (0.3 mg total) into the muscle as needed for anaphylaxis. (bee stings)   ferrous sulfate 325 (65 FE) MG EC tablet Take 325 mg by mouth every other day.   furosemide (LASIX) 20 MG tablet TAKE 1 TABLET BY MOUTH EVERY DAY AS NEEDED FOR FLUID OR EDEMA.   gabapentin (NEURONTIN) 300 MG capsule Take 1 capsule (300 mg total) by mouth in the morning AND 2 capsules (600 mg total) 2 (two) times daily. (lunch and dinner).   ketoconazole (NIZORAL) 2 % shampoo Apply 1 application topically 3 (three) times a week.   levothyroxine (SYNTHROID) 100 MCG tablet TAKE 1 TABLET BY MOUTH DAILY BEFORE BREAKFAST.   metoprolol succinate (TOPROL-XL) 100 MG 24 hr tablet TAKE 1 TABLET BY MOUTH EVERY DAY   nitroGLYCERIN (NITROSTAT) 0.4 MG SL tablet Place 1 tablet (0.4 mg total) under the tongue every 5  (five) minutes as needed for chest pain.   ondansetron (ZOFRAN ODT) 4 MG disintegrating tablet Take 1 tablet (4 mg total) by mouth every 8 (eight) hours as needed.   pantoprazole (PROTONIX) 40 MG tablet TAKE 1 TABLET BY MOUTH EVERY DAY   tiZANidine (ZANAFLEX) 4 MG tablet Take 1 tablet (4 mg total) by mouth every 6 (six) hours as needed for muscle spasms.   triamcinolone (KENALOG) 0.1 % Apply 1 application topically 2 (two) times daily. Apply to AA.   Ubrogepant (UBRELVY) 50 MG TABS Take 50 mg by mouth daily as needed.   No facility-administered encounter medications on file as of 04/06/2021.    Contacted patient to follow up after social worker visit and check on blood pressure.   Requested refill from Dr. Bosie Clos clinic for Metoprolol to be sent to [facility] 04/11/21.   Attempted contact with [name] 3 times on 04/06/21, 04/11/21, 04/12/21. Unsuccessful outreach. Will attempt contact next month.   [name], CPP notified  Margaretmary Dys, [name] Pharmacy Assistant [phone]

## 2021-04-11 ENCOUNTER — Telehealth: Payer: Self-pay

## 2021-04-11 MED ORDER — METOPROLOL SUCCINATE ER 100 MG PO TB24: 100.0000 mg | ORAL_TABLET | Freq: Every day | ORAL | 3 refills | Status: DC

## 2021-04-11 NOTE — Telephone Encounter (Signed)
-----   Message from [place] sent at 04/11/2021 10:12 AM EDT ----- Regarding: Refill Patient recently switched to [facility] and will need a refill of Metoprolol. If appropriate will you please send refill to [facility]? Thank you!  Margaretmary Dys, [place] Pharmacy Assistant [phone]

## 2021-04-11 NOTE — Telephone Encounter (Signed)
E-scribed refill 

## 2021-04-19 ENCOUNTER — Ambulatory Visit (INDEPENDENT_AMBULATORY_CARE_PROVIDER_SITE_OTHER): Payer: Medicare Other | Admitting: Orthopedic Surgery

## 2021-04-19 ENCOUNTER — Encounter: Payer: Self-pay | Admitting: Orthopedic Surgery

## 2021-04-19 DIAGNOSIS — M25561 Pain in right knee: Secondary | ICD-10-CM

## 2021-04-19 NOTE — Progress Notes (Signed)
Office Visit Note   Patient: Nicholas [name]           Date of Birth: 24-Dec-1950           MRN: [id number] Visit Date: 04/19/2021 Requested by: [name], MD [place],  [place] [address] PCP: [name], MD  Subjective: Chief Complaint  Patient presents with   Other    Scan review     HPI: Nicholas [name] is a patient with right knee and leg pain.  Had CT scan on his fused knee which essentially was normal.  Showed good fusion with no complicating features.  He does have a history of low back pain with surgery affecting the right-hand side and left-hand side.  The surgery was for predominantly left-sided symptoms but he has had some right-sided symptoms for which he has not had surgical treatment.              ROS: All systems reviewed are negative as they relate to the chief complaint within the history of present illness.  Patient denies  fevers or chills.   Assessment & Plan: Visit Diagnoses:  1. Tenderness of right knee     Plan: Impression is right knee and leg pain.  This looks like he could be coming from his back.  Hip range of motion is pretty reasonable.  I think in general Nicholas [name] is most interested in avoiding all future surgery.  I see no definite surgical indication for him at this time.  He is doing well with his shoulders and left knee replacement.  Follow-up as needed  Follow-Up Instructions: Return if symptoms worsen or fail to improve.   Orders:  No orders of the defined types were placed in this encounter.  No orders of the defined types were placed in this encounter.     Procedures: No procedures performed   Clinical Data: No additional findings.  Objective: Vital Signs: There were no vitals taken for this visit.  Physical Exam:   Constitutional: Patient appears well-developed HEENT:  Head: Normocephalic Eyes:EOM are normal Neck: Normal range of motion Cardiovascular: Normal rate Pulmonary/chest: Effort  normal Neurologic: Patient is alert Skin: Skin is warm Psychiatric: Patient has normal mood and affect   Ortho Exam: Ortho exam demonstrates full active and passive range of motion of the hip.  Knee itself has no effusion or warmth.  Ankle dorsiflexion intact.  Equivocal nerve root tension signs.  Gait unchanged.  Specialty Comments:  No specialty comments available.  Imaging: No results found.   PMFS History: Patient Active Problem List   Diagnosis Date Noted   Chronic toe ulcer, left, with fat layer exposed ([place]) 01/23/2021   Pituitary adenoma ([place]) 09/06/2020   Chronic intractable headache 07/27/2020   Vitamin B12 deficiency 03/31/2020   Vitamin D deficiency 03/31/2020   Numbness of foot 03/25/2020   Right hand pain 12/14/2019   Status post amputation of great toe, left ([place]) 12/14/2019   Callus of foot 04/02/2019   Allergy to bee sting 03/11/2019   Insomnia 02/10/2019   Status post lumbar spine operative procedure for decompression of spinal cord 12/12/2018   Dyslipidemia 10/19/2018   Foot deformity 10/19/2018   Spinal stenosis of lumbar region without neurogenic claudication 10/01/2018   Facial rash 05/26/2018   Status post reverse total replacement of right shoulder 05/13/2018   CHF (congestive heart failure) ([place]) 12/27/2017   OSA (obstructive sleep apnea) 12/27/2017   Health maintenance examination 11/25/2017   CKD (chronic kidney disease)  stage 3, GFR 30-59 ml/min (HCC) 10/01/2017   Fatty liver 10/01/2017   Status post reverse total replacement of left shoulder 09/03/2017   Bilateral shoulder pain 05/24/2017   History of CVA in adulthood 03/08/2017   S/P total knee arthroplasty, left 01/30/2017   Arthritis of knee 01/15/2017   Skin rash 12/10/2016   Medicare annual wellness visit, subsequent 10/05/2016   Advanced care planning/counseling discussion 10/05/2016   Severe obesity (BMI 35.0-39.9) with comorbidity ([place]) 07/19/2016   Hypothyroidism    Iron  deficiency 04/23/2016   Chronic pain syndrome 04/23/2016   Inflammatory arthritis 04/23/2016   Chronic leg pain 04/23/2016   Lesion of palate 04/23/2016   Grieving 04/23/2016   GERD (gastroesophageal reflux disease)    Duodenitis determined by biopsy 02/13/2016   Diverticulosis of colon without hemorrhage 01/31/2016   Chest pain 01/11/2012   Migraine 01/11/2012   History of methicillin resistant staphylococcus aureus (MRSA) 07/04/2010   DDD (degenerative disc disease), lumbar 07/04/2010   History of total right knee replacement 07/04/2010   Primary osteoarthritis, right shoulder 06/09/2007   Personal history of colonic adenoma 06/01/2003   Past Medical History:  Diagnosis Date   Acute kidney failure 08/2008   "cleared up"  no problems since   Allergy    Anxiety    Arthritis    Asthma    ?of this no inhaler   Blood transfusion    CHF (congestive heart failure) ([place])    ?of this, pt denies   CLOSTRIDIUM DIFFICILE COLITIS 07/04/2010   Annotation: 12/09, 2/10 Qualifier: Diagnosis of  By: [name], [name]     Depression with anxiety    Duodenitis determined by biopsy 02/2016   peptic likely due to aleve (erosive gastropathy with duodenal erosions)   ECZEMA 07/04/2010   Qualifier: Diagnosis of  By: [name], [name]     Elevated liver enzymes    Full dentures    GERD (gastroesophageal reflux disease)    Heart attack ([place])    08/2008 (likely demand ischemia in the setting of MSSA sepsis/R TKA  infection)10-2008   History of hiatal hernia    History of stomach ulcers    Hypothyroidism    Lower GI bleeding    MALAR AND MAXILLARY BONES CLOSED FRACTURE 07/04/2010   Annotation: ORIF Qualifier: Diagnosis of  By: [name], [name]     Migraine    "definitely"   MRSA (methicillin resistant Staphylococcus aureus)    in leg, had to place steel rod in leg   Personal history of colonic adenoma 06/01/2003   PFO (patent foramen ovale)    small PFO by 08/2008 TEE   Pneumonia 08/2008    "while in ICU"   Seizures ([place]) 2009   "long time ago"     Sleep apnea    does not wear CPAP   Stroke ([place]) 2010   unable to complete sentences at times   Wears glasses     Family History  Problem Relation Age of Onset   Coronary artery disease Father 70   Stroke Father    CAD Mother 12       stents   Breast cancer Sister    Diabetes Brother    Diabetes Sister    Colon cancer Neg Hx    Stomach cancer Neg Hx     Past Surgical History:  Procedure Laterality Date   anterior  nerve transposition  07/2009   left ulnar nerve   antibiotic spacer exchange  11/2008; 08/2006   right  knee   ARTHROTOMY  08/2008   right knee w/I&D   CARDIOVASCULAR STRESS TEST  2013   stress EKG - negative for ischemia, 4 min 7.3 METs, normal blood pressure response   CATARACT EXTRACTION     COLONOSCOPY  11/2012   diverticulosis, rpt 10 yrs [name])   ESOPHAGOGASTRODUODENOSCOPY  02/2016   erosive gastropathy with duodenal erosions [name])   HARDWARE REMOVAL  04/2006   right knee w/antibiotic spacers placed   INGUINAL HERNIA REPAIR  early 1990's   bilateral   JOINT REPLACEMENT Bilateral    KNEE ARTHROSCOPY  10/2001   right   KNEE FUSION  03/2009   right knee removal; antibiotic spacers;    LEFT HEART CATH AND CORONARY ANGIOGRAPHY N/A 02/04/2018    WNL (Patwardhan, Reynold Bowen, MD)   LUMBAR LAMINECTOMY/DECOMPRESSION MICRODISCECTOMY N/A 12/05/2018   Procedure: LUMBAR THREE TO FOUR AND LUMBAR FOUR TO FIVE DECOMPRESSION;  Surgeon: [name], MD;  Location: [facility];  Service: Orthopedics;  Laterality: N/A;   MULTIPLE TOOTH EXTRACTIONS     REPLACEMENT TOTAL KNEE  08/2008; 09/2001   right   REVERSE SHOULDER ARTHROPLASTY Left 09/03/2017   Procedure: REVERSE SHOULDER ARTHROPLASTY;  Surgeon: Meredith Pel, MD;  Location: [facility];  Service: Orthopedics;  Laterality: Left;   REVERSE SHOULDER ARTHROPLASTY Right 05/13/2018   Procedure: RIGHT REVERSE SHOULDER ARTHROPLASTY;  Surgeon: Meredith Pel, MD;   Location: [facility];  Service: Orthopedics;  Laterality: Right;   rod placement Right 03/2009   R knee   SYNOVECTOMY  06/2005   debridement, liner exchange right knee   TOE AMPUTATION Left 08/2008   great toe - osteomyelitis (staph infection)   TOE AMPUTATION Left 04/2016   2nd toe [name]   TONSILLECTOMY     TOTAL KNEE ARTHROPLASTY Left 01/15/2017   TOTAL KNEE ARTHROPLASTY Left 01/15/2017   Procedure: TOTAL KNEE ARTHROPLASTY;  Surgeon: Meredith Pel, MD;  Location: [facility];  Service: Orthopedics;  Laterality: Left;   Social History   Occupational History   Occupation: retired  Tobacco Use   Smoking status: Never   Smokeless tobacco: Never  Vaping Use   Vaping Use: Never used  Substance and Sexual Activity   Alcohol use: No    Alcohol/week: 0.0 standard drinks    Comment: "I abused alcohol; last drink  ~ 2005"   Drug use: No   Sexual activity: Never

## 2021-04-20 ENCOUNTER — Telehealth: Payer: Self-pay

## 2021-04-20 DIAGNOSIS — E039 Hypothyroidism, unspecified: Secondary | ICD-10-CM

## 2021-04-20 MED ORDER — LEVOTHYROXINE SODIUM 100 MCG PO TABS: 100.0000 ug | ORAL_TABLET | Freq: Every day | ORAL | 3 refills | Status: DC

## 2021-04-20 NOTE — Telephone Encounter (Signed)
E-scribed refill to [facility].

## 2021-04-20 NOTE — Telephone Encounter (Signed)
-----   Message from [place] sent at 04/20/2021  7:53 AM EDT ----- Regarding: Refill Patient recently switched to [facility] and will need a refill of Levothyroxine. If appropriate will you please send refill to [facility]? Thank you!  Margaretmary Dys, [place] Pharmacy Assistant [phone]

## 2021-04-20 NOTE — Chronic Care Management (AMB) (Signed)
    Chronic Care Management Pharmacy Assistant   Name: Nicholas [name]  MRN: [id number] DOB: 04-17-1951  Reason for Encounter: Refill Request. Levothyroxine   Medications: Outpatient Encounter Medications as of 04/20/2021  Medication Sig   acetaminophen (TYLENOL) 500 MG tablet Take 1,000 mg by mouth 2 (two) times daily. IN THE AFTERNOON & AT NIGHT   amitriptyline (ELAVIL) 50 MG tablet TAKE 1-2 TABLETS BY MOUTH AT BEDTIME.   atorvastatin (LIPITOR) 20 MG tablet Take 1 tablet (20 mg total) by mouth daily.   Cholecalciferol (VITAMIN D3) 25 MCG (1000 UT) CAPS Take 1 capsule (1,000 Units total) by mouth daily.   clotrimazole (LOTRIMIN) 1 % cream Apply 1 application topically 2 (two) times daily. To groin (Patient taking differently: Apply 1 application topically 3 (three) times daily. To groin)   divalproex (DEPAKOTE ER) 500 MG 24 hr tablet Take 1 tablet (500 mg total) by mouth at bedtime. (Patient not taking: Reported on 03/24/2021)   EPINEPHrine 0.3 mg/0.3 mL IJ SOAJ injection Inject 0.3 mLs (0.3 mg total) into the muscle as needed for anaphylaxis. (bee stings)   ferrous sulfate 325 (65 FE) MG EC tablet Take 325 mg by mouth every other day.   furosemide (LASIX) 20 MG tablet TAKE 1 TABLET BY MOUTH EVERY DAY AS NEEDED FOR FLUID OR EDEMA.   gabapentin (NEURONTIN) 300 MG capsule Take 1 capsule (300 mg total) by mouth in the morning AND 2 capsules (600 mg total) 2 (two) times daily. (lunch and dinner).   ketoconazole (NIZORAL) 2 % shampoo Apply 1 application topically 3 (three) times a week.   levothyroxine (SYNTHROID) 100 MCG tablet TAKE 1 TABLET BY MOUTH DAILY BEFORE BREAKFAST.   metoprolol succinate (TOPROL-XL) 100 MG 24 hr tablet Take 1 tablet (100 mg total) by mouth daily. Take with or immediately following a meal.   nitroGLYCERIN (NITROSTAT) 0.4 MG SL tablet Place 1 tablet (0.4 mg total) under the tongue every 5 (five) minutes as needed for chest pain.   ondansetron (ZOFRAN ODT) 4 MG disintegrating  tablet Take 1 tablet (4 mg total) by mouth every 8 (eight) hours as needed.   pantoprazole (PROTONIX) 40 MG tablet TAKE 1 TABLET BY MOUTH EVERY DAY   tiZANidine (ZANAFLEX) 4 MG tablet Take 1 tablet (4 mg total) by mouth every 6 (six) hours as needed for muscle spasms.   triamcinolone (KENALOG) 0.1 % Apply 1 application topically 2 (two) times daily. Apply to AA.   Ubrogepant (UBRELVY) 50 MG TABS Take 50 mg by mouth daily as needed.   No facility-administered encounter medications on file as of 04/20/2021.   Message sent to Dr. Danise Mina clinic to request refill of levothyroxine to be sent to [facility] if appropriate.    [name], CPP notified  Margaretmary Dys, [place] Pharmacy Assistant [phone]

## 2021-04-28 ENCOUNTER — Other Ambulatory Visit: Payer: Self-pay | Admitting: Family Medicine

## 2021-04-29 ENCOUNTER — Other Ambulatory Visit: Payer: Self-pay | Admitting: Family Medicine

## 2021-05-30 ENCOUNTER — Telehealth: Payer: Self-pay

## 2021-05-30 NOTE — Chronic Care Management (AMB) (Addendum)
Chronic Care Management Pharmacy Assistant   Name: Nicholas [name]  MRN: [id number] DOB: 11-Mar-1951   Reason for Encounter: Medication Adherence and Delivery Coordination    Recent office visits:  None since last CCM contact  Recent consult visits:  04/19/21 - Orthopedics-Patient presented for Right Knee pain. No medication changes  Hospital visits:  None in previous 6 months  Medications: Outpatient Encounter Medications as of 05/30/2021  Medication Sig   acetaminophen (TYLENOL) 500 MG tablet Take 1,000 mg by mouth 2 (two) times daily. IN THE AFTERNOON & AT NIGHT   amitriptyline (ELAVIL) 50 MG tablet TAKE 1-2 TABLETS BY MOUTH AT BEDTIME.   atorvastatin (LIPITOR) 20 MG tablet Take 1 tablet (20 mg total) by mouth daily.   Cholecalciferol (VITAMIN D3) 25 MCG (1000 UT) CAPS Take 1 capsule (1,000 Units total) by mouth daily.   clotrimazole (LOTRIMIN) 1 % cream Apply 1 application topically 2 (two) times daily. To groin (Patient taking differently: Apply 1 application topically 3 (three) times daily. To groin)   divalproex (DEPAKOTE ER) 500 MG 24 hr tablet Take 1 tablet (500 mg total) by mouth at bedtime. (Patient not taking: Reported on 03/24/2021)   EPINEPHrine 0.3 mg/0.3 mL IJ SOAJ injection Inject 0.3 mLs (0.3 mg total) into the muscle as needed for anaphylaxis. (bee stings)   ferrous sulfate 325 (65 FE) MG EC tablet Take 325 mg by mouth every other day.   furosemide (LASIX) 20 MG tablet TAKE 1 TABLET BY MOUTH EVERY DAY AS NEEDED FOR FLUID OR EDEMA.   gabapentin (NEURONTIN) 300 MG capsule Take 1 capsule (300 mg total) by mouth in the morning AND 2 capsules (600 mg total) 2 (two) times daily. (lunch and dinner).   ketoconazole (NIZORAL) 2 % shampoo Apply 1 application topically 3 (three) times a week.   levothyroxine (SYNTHROID) 100 MCG tablet Take 1 tablet (100 mcg total) by mouth daily before breakfast.   metoprolol succinate (TOPROL-XL) 100 MG 24 hr tablet Take 1 tablet (100 mg total)  by mouth daily. Take with or immediately following a meal.   nitroGLYCERIN (NITROSTAT) 0.4 MG SL tablet Place 1 tablet (0.4 mg total) under the tongue every 5 (five) minutes as needed for chest pain.   ondansetron (ZOFRAN ODT) 4 MG disintegrating tablet Take 1 tablet (4 mg total) by mouth every 8 (eight) hours as needed.   pantoprazole (PROTONIX) 40 MG tablet TAKE 1 TABLET BY MOUTH EVERY DAY   tiZANidine (ZANAFLEX) 4 MG tablet Take 1 tablet (4 mg total) by mouth every 6 (six) hours as needed for muscle spasms.   triamcinolone (KENALOG) 0.1 % Apply 1 application topically 2 (two) times daily. Apply to AA.   Ubrogepant (UBRELVY) 50 MG TABS Take 50 mg by mouth daily as needed.   No facility-administered encounter medications on file as of 05/30/2021.   BP Readings from Last 3 Encounters:  03/01/21 121/84  03/01/21 120/72  02/14/21 120/80    Lab Results  Component Value Date   HGBA1C 5.6 07/13/2010     Recent OV, Consult or Hospital visit: 04/19/21-Orthopedics for right knee pain. No medication changes indicated   Last adherence delivery date:  new onboard      Patient is due for next adherence delivery on: 06/08/2021  Multiple attempts made to reach patient on 05/30/21,05/31/21,06/01/21. Unsuccessful outreach. Will refill based off of last adherence fill.   This delivery to include: Adherence Packaging  30 Days  Packs: Ferrous sulfate 324 mg  take 1 tablet  at breakfast every other day  Metoprolol '100mg'$   take 1 tablet at bedtime  Atorvastatin '20mg'$  take 1 tablet at breakfast Gabapentin '300mg'$  take 1 tablet breakfast, 2 tablets lunch,2 tablets evening meal Levothyroxine 150mg take 1 tablet at breakfast Pantoprazole '40mg'$  take 1 tablet at breakfast Amitriptyline '50mg'$  take 2 tablets at bedtime Vitamin D3 237m take 1 capsule breakfast Furosemide '20mg'$   take 1 tablet daily breakfast   Vials: None  Patient declined the following medications this month: None  No refill request needed from  PCP.  Delivery scheduled for 06/08/21. Unable to speak with patient to confirm date.   MiDebbora DusCPP notified  VeAvel SensorCCMaricopassistant [phone] have reviewed the care management and care coordination activities outlined in this encounter and I am certifying that I agree with the content of this note. Also attempted to contact patient since this is his first packaging. He did not answer and VM full. No further action required.  MiDebbora DusPharmD Clinical Pharmacist [phone]

## 2021-06-28 ENCOUNTER — Telehealth: Payer: Self-pay

## 2021-06-28 NOTE — Progress Notes (Signed)
Chart reviewed, agree above plan ?

## 2021-06-28 NOTE — Progress Notes (Addendum)
Chronic Care Management Pharmacy Assistant   Name: Nicholas [name]  MRN: [id number] DOB: 1951/03/23  Reason for Encounter: Medication Adherence and Delivery Coordination   Recent office visits:  None since last CCM contact  Recent consult visits:  None since last CCM contact  Hospital visits:  None in previous 6 months  Medications: Outpatient Encounter Medications as of 06/28/2021  Medication Sig   acetaminophen (TYLENOL) 500 MG tablet Take 1,000 mg by mouth 2 (two) times daily. IN THE AFTERNOON & AT NIGHT   amitriptyline (ELAVIL) 50 MG tablet TAKE 1-2 TABLETS BY MOUTH AT BEDTIME.   atorvastatin (LIPITOR) 20 MG tablet Take 1 tablet (20 mg total) by mouth daily.   Cholecalciferol (VITAMIN D3) 25 MCG (1000 UT) CAPS Take 1 capsule (1,000 Units total) by mouth daily.   clotrimazole (LOTRIMIN) 1 % cream Apply 1 application topically 2 (two) times daily. To groin (Patient taking differently: Apply 1 application topically 3 (three) times daily. To groin)   divalproex (DEPAKOTE ER) 500 MG 24 hr tablet Take 1 tablet (500 mg total) by mouth at bedtime. (Patient not taking: Reported on 03/24/2021)   EPINEPHrine 0.3 mg/0.3 mL IJ SOAJ injection Inject 0.3 mLs (0.3 mg total) into the muscle as needed for anaphylaxis. (bee stings)   ferrous sulfate 325 (65 FE) MG EC tablet Take 325 mg by mouth every other day.   furosemide (LASIX) 20 MG tablet TAKE 1 TABLET BY MOUTH EVERY DAY AS NEEDED FOR FLUID OR EDEMA.   gabapentin (NEURONTIN) 300 MG capsule Take 1 capsule (300 mg total) by mouth in the morning AND 2 capsules (600 mg total) 2 (two) times daily. (lunch and dinner).   ketoconazole (NIZORAL) 2 % shampoo Apply 1 application topically 3 (three) times a week.   levothyroxine (SYNTHROID) 100 MCG tablet Take 1 tablet (100 mcg total) by mouth daily before breakfast.   metoprolol succinate (TOPROL-XL) 100 MG 24 hr tablet Take 1 tablet (100 mg total) by mouth daily. Take with or immediately following a  meal.   nitroGLYCERIN (NITROSTAT) 0.4 MG SL tablet Place 1 tablet (0.4 mg total) under the tongue every 5 (five) minutes as needed for chest pain.   ondansetron (ZOFRAN ODT) 4 MG disintegrating tablet Take 1 tablet (4 mg total) by mouth every 8 (eight) hours as needed.   pantoprazole (PROTONIX) 40 MG tablet TAKE 1 TABLET BY MOUTH EVERY DAY   tiZANidine (ZANAFLEX) 4 MG tablet Take 1 tablet (4 mg total) by mouth every 6 (six) hours as needed for muscle spasms.   triamcinolone (KENALOG) 0.1 % Apply 1 application topically 2 (two) times daily. Apply to AA.   Ubrogepant (UBRELVY) 50 MG TABS Take 50 mg by mouth daily as needed.   No facility-administered encounter medications on file as of 06/28/2021.   BP Readings from Last 3 Encounters:  03/01/21 121/84  03/01/21 120/72  02/14/21 120/80    Lab Results  Component Value Date   HGBA1C 5.6 07/13/2010    No OVs, Consults, or hospital visits since last care coordination call / Pharmacist visit. No medication changes indicated  Last adherence delivery date: 06/08/2021      Patient is due for next adherence delivery on: 07/07/2021  Spoke with patient on 07/03/2021 reviewed medications and coordinated delivery.  This delivery to include: Adherence Packaging  30 Days  Packs: Gabapentin '300mg'$  take 1 tablet breakfast, 2 tablets lunch,2 tablets evening meal Furosemide '20mg'$   take 1 tablet daily breakfast  Pantoprazole '40mg'$  take 1 tablet  at breakfast Atorvastatin '20mg'$  take 1 tablet at breakfast Vitamin D3 76mg take 1 capsule breakfast Ferrous sulfate 324 mg  take 1 tablet at breakfast every other day  Metoprolol '100mg'$   take 1 tablet at bedtime  Levothyroxine 1057m take 1 tablet at breakfast Amitriptyline '50mg'$  take 2 tablets at bedtime  VIAL medications: None  Patient declined the following medications this month: Patient did not decline any medications.   Any concerns about your medications? No  How often do you forget or accidentally  miss a dose? Rarely  Do you use a pillbox? No  Is patient in packaging Yes  If yes  What is the date on your next pill pack? 07/03/2021 - lunchtime  Any concerns or issues with your packaging? No  No refill request needed.  Confirmed delivery date of 07/07/2021, advised patient that pharmacy will contact them the morning of delivery.   Recent blood pressure readings are as follows: Patient does not take any readings at home.   Recent blood glucose readings are as follows: Patient does not take any readings at home.   Annual wellness visit in last year? No 10/05/2016 Most Recent BP reading: 121/84 on 03/01/2021  MiDebbora DusCPP notified  AmMarijean NiemannRMLittlestownssistant [phone] have reviewed the care management and care coordination activities outlined in this encounter and I am certifying that I agree with the content of this note. No further action required.  MiDebbora DusPharmD Clinical Pharmacist [phone]

## 2021-07-04 ENCOUNTER — Other Ambulatory Visit: Payer: Self-pay

## 2021-07-04 ENCOUNTER — Ambulatory Visit (INDEPENDENT_AMBULATORY_CARE_PROVIDER_SITE_OTHER): Payer: Medicare Other | Admitting: Family Medicine

## 2021-07-04 ENCOUNTER — Encounter: Payer: Self-pay | Admitting: Family Medicine

## 2021-07-04 VITALS — BP 120/70 | HR 55 | Temp 97.4°F | Ht 68.0 in | Wt 254.0 lb

## 2021-07-04 DIAGNOSIS — N1831 Chronic kidney disease, stage 3a: Secondary | ICD-10-CM

## 2021-07-04 DIAGNOSIS — E538 Deficiency of other specified B group vitamins: Secondary | ICD-10-CM

## 2021-07-04 DIAGNOSIS — E559 Vitamin D deficiency, unspecified: Secondary | ICD-10-CM

## 2021-07-04 DIAGNOSIS — E039 Hypothyroidism, unspecified: Secondary | ICD-10-CM

## 2021-07-04 DIAGNOSIS — R413 Other amnesia: Secondary | ICD-10-CM

## 2021-07-04 LAB — RENAL FUNCTION PANEL
Albumin: 4.2 g/dL (ref 3.5–5.2)
BUN: 20 mg/dL (ref 6–23)
CO2: 29 mEq/L (ref 19–32)
Calcium: 8.9 mg/dL (ref 8.4–10.5)
Chloride: 103 mEq/L (ref 96–112)
Creatinine, Ser: 1.12 mg/dL (ref 0.40–1.50)
GFR: 66.51 mL/min (ref >60.00)
Glucose, Bld: 95 mg/dL (ref 70–99)
Phosphorus: 3.4 mg/dL (ref 2.3–4.6)
Potassium: 4.3 mEq/L (ref 3.5–5.1)
Sodium: 139 mEq/L (ref 135–145)

## 2021-07-04 LAB — VITAMIN B12: Vitamin B-12: 224 pg/mL (ref 211–911)

## 2021-07-04 LAB — VITAMIN D 25 HYDROXY (VIT D DEFICIENCY, FRACTURES): VITD: 40.71 ng/mL (ref 30.00–100.00)

## 2021-07-04 LAB — TSH: TSH: 0.4 u[IU]/mL (ref 0.35–5.50)

## 2021-07-04 MED ORDER — CYANOCOBALAMIN 1000 MCG/ML IJ SOLN
1000.0000 ug | Freq: Once | INTRAMUSCULAR | Status: AC
  Administered 2021-07-04: 1000 ug via INTRAMUSCULAR

## 2021-07-04 MED ORDER — CYANOCOBALAMIN 500 MCG PO TABS: 500.0000 ug | ORAL_TABLET | Freq: Every day | ORAL | Status: DC

## 2021-07-04 NOTE — Progress Notes (Signed)
Patient ID: Nicholas [name], male    DOB: Dec 05, 1950, 70 y.o.   MRN: [id number]  This visit was conducted in person.  BP 120/70   Pulse (!) 55   Temp (!) 97.4 F (36.3 C) (Temporal)   Ht 5\' 8"  (1.727 m)   Wt 254 lb (115.2 kg)   SpO2 92%   BMI 38.62 kg/m    CC: memory loss Subjective:   HPI: Nicholas [name] is a 70 y.o. male presenting on 07/04/2021 for Memory Loss (C/o memory issues for awhile, worsening.  Can forget things during a conversation.  Loses train of thought. )   Notes several months of difficulty with memory. Easily loses train of thought. Can forget how to get to places when he's driving. Forgets what he's doing when entering a room.  Lives alone. Memory trouble has not been noted by family/friends.  Increased stress worrying about daughter.  Wonders about taking prevagen.  No long term memory issues.   Head injury suffered 08/2009 - fell backwards onto concrete, cracked skull.  He had several falls in May, mostly out of his car when foot would get stuck behind pedals.   No significant change in tremor. Chronic leg weakness, uses rollator at baseline.  No mood changes or behavioral changes noted.   Rents home. Has tub for shower - this is dangerous given mobility issues. He does have hand rail. Has been told he couldn't get walk-in shower.   Geriatric Assessment: Activities of Daily Living:     Bathing- independent    Dressing- independent    Eating- independent    Toileting- independent    Transferring- independent    Continence- independent Overall Assessment: independent  Instrumental Activities of Daily Living:     Transportation- independent    Meal/Food Preparation-independent    Shopping Errands-independent    Housekeeping/Chores-independent    Money Management/Finances-independent    Medication Management-independent    Ability to Use Telephone-independent    Laundry-independent Overall Assessment: independent  Mental Status Exam: 27/30, 29  with cue     Clock Drawing Score: 3/4     Relevant past medical, surgical, family and social history reviewed and updated as indicated. Interim medical history since our last visit reviewed. Allergies and medications reviewed and updated. Outpatient Medications Prior to Visit  Medication Sig Dispense Refill   acetaminophen (TYLENOL) 500 MG tablet Take 1,000 mg by mouth 2 (two) times daily. IN THE AFTERNOON & AT NIGHT     amitriptyline (ELAVIL) 50 MG tablet TAKE 1-2 TABLETS BY MOUTH AT BEDTIME. 180 tablet 3   atorvastatin (LIPITOR) 20 MG tablet Take 1 tablet (20 mg total) by mouth daily. 90 tablet 3   Cholecalciferol (VITAMIN D3) 25 MCG (1000 UT) CAPS Take 1 capsule (1,000 Units total) by mouth daily. 90 capsule 1   clotrimazole (LOTRIMIN) 1 % cream Apply 1 application topically 2 (two) times daily. To groin (Patient taking differently: Apply 1 application topically 3 (three) times daily. To groin) 60 g 1   divalproex (DEPAKOTE ER) 500 MG 24 hr tablet Take 1 tablet (500 mg total) by mouth at bedtime. 180 tablet 3   EPINEPHrine 0.3 mg/0.3 mL IJ SOAJ injection Inject 0.3 mLs (0.3 mg total) into the muscle as needed for anaphylaxis. (bee stings) 2 each 1   ferrous sulfate 325 (65 FE) MG EC tablet Take 325 mg by mouth every other day.     furosemide (LASIX) 20 MG tablet TAKE 1 TABLET BY MOUTH EVERY DAY  AS NEEDED FOR FLUID OR EDEMA. 90 tablet 1   gabapentin (NEURONTIN) 300 MG capsule Take 1 capsule (300 mg total) by mouth in the morning AND 2 capsules (600 mg total) 2 (two) times daily. (lunch and dinner). 450 capsule 1   ketoconazole (NIZORAL) 2 % shampoo Apply 1 application topically 3 (three) times a week. 120 mL 3   levothyroxine (SYNTHROID) 100 MCG tablet Take 1 tablet (100 mcg total) by mouth daily before breakfast. 90 tablet 3   metoprolol succinate (TOPROL-XL) 100 MG 24 hr tablet Take 1 tablet (100 mg total) by mouth daily. Take with or immediately following a meal. 90 tablet 3    nitroGLYCERIN (NITROSTAT) 0.4 MG SL tablet Place 1 tablet (0.4 mg total) under the tongue every 5 (five) minutes as needed for chest pain. 20 tablet 3   ondansetron (ZOFRAN ODT) 4 MG disintegrating tablet Take 1 tablet (4 mg total) by mouth every 8 (eight) hours as needed. 20 tablet 6   pantoprazole (PROTONIX) 40 MG tablet TAKE 1 TABLET BY MOUTH EVERY DAY 90 tablet 3   tiZANidine (ZANAFLEX) 4 MG tablet Take 1 tablet (4 mg total) by mouth every 6 (six) hours as needed for muscle spasms. 30 tablet 6   triamcinolone (KENALOG) 0.1 % Apply 1 application topically 2 (two) times daily. Apply to AA. 80 g 0   Ubrogepant (UBRELVY) 50 MG TABS Take 50 mg by mouth daily as needed. 12 tablet 11   No facility-administered medications prior to visit.     Per HPI unless specifically indicated in ROS section below Review of Systems  Objective:  BP 120/70   Pulse (!) 55   Temp (!) 97.4 F (36.3 C) (Temporal)   Ht 5\' 8"  (1.727 m)   Wt 254 lb (115.2 kg)   SpO2 92%   BMI 38.62 kg/m   Wt Readings from Last 3 Encounters:  07/04/21 254 lb (115.2 kg)  03/01/21 252 lb (114.3 kg)  03/01/21 252 lb 6 oz (114.5 kg)      Physical Exam Vitals and nursing note reviewed.  Constitutional:      Appearance: Normal appearance. He is not ill-appearing.     Comments:  Ambulates with rolling walker Stiff movements  Cardiovascular:     Rate and Rhythm: Normal rate and regular rhythm.     Pulses: Normal pulses.     Heart sounds: Normal heart sounds. No murmur heard. Pulmonary:     Effort: Pulmonary effort is normal. No respiratory distress.     Breath sounds: Normal breath sounds. No wheezing, rhonchi or rales.  Musculoskeletal:     Right lower leg: No edema.     Left lower leg: No edema.     Comments: R leg chronically fixed in extension at knee  Neurological:     Mental Status: He is alert.  Psychiatric:        Mood and Affect: Mood normal.        Behavior: Behavior normal.      Results for orders placed  or performed in visit on 07/04/21  Vitamin B12  Result Value Ref Range   Vitamin B-12 224 211 - 911 pg/mL  VITAMIN D 25 Hydroxy (Vit-D Deficiency, Fractures)  Result Value Ref Range   VITD 40.71 30.00 - 100.00 ng/mL  Renal function panel  Result Value Ref Range   Sodium 139 135 - 145 mEq/L   Potassium 4.3 3.5 - 5.1 mEq/L   Chloride 103 96 - 112 mEq/L   CO2  29 19 - 32 mEq/L   Albumin 4.2 3.5 - 5.2 g/dL   BUN 20 6 - 23 mg/dL   Creatinine, Ser 1.12 0.40 - 1.50 mg/dL   Glucose, Bld 95 70 - 99 mg/dL   Phosphorus 3.4 2.3 - 4.6 mg/dL   GFR 66.51 >60.00 mL/min   Calcium 8.9 8.4 - 10.5 mg/dL  TSH  Result Value Ref Range   TSH 0.40 0.35 - 5.50 uIU/mL    Assessment & Plan:  This visit occurred during the SARS-CoV-2 public health emergency.  Safety protocols were in place, including screening questions prior to the visit, additional usage of staff PPE, and extensive cleaning of exam room while observing appropriate contact time as indicated for disinfecting solutions.   Problem List Items Addressed This Visit     Severe obesity (BMI 35.0-39.9) with comorbidity ([place])   Hypothyroidism    Continues levothyroxine 160mcg daily - update TSH.       CKD (chronic kidney disease) stage 3, GFR 30-59 ml/min ([facility])    Update renal panel.       Relevant Orders   Renal function panel (Completed)   Vitamin B12 deficiency    He believes he's taking vitamin b12 516mcg daily replacement. Update levels on this. B12 shot today as well. Last B12 shot was 02/2021.  Depending on results, may need monthly B12 shots to maintain levels.       Relevant Orders   Vitamin B12 (Completed)   Vitamin D deficiency    Update levels on vit D 1000 IU daily.       Relevant Orders   VITAMIN D 25 Hydroxy (Vit-D Deficiency, Fractures) (Completed)   Memory difficulty - Primary    Endorses several months of memory deficits - predominantly short term memory. Fortunately memory testing today overall stable. Will start  evaluation with labwork including B12, TSH. Will continue to monitor symptoms closely. Will consider home safety evaluation with recent falls endorsed.       Relevant Orders   Vitamin B12 (Completed)   Renal function panel (Completed)   TSH (Completed)   RPR     Meds ordered this encounter  Medications   vitamin B-12 (CYANOCOBALAMIN) 500 MCG tablet    Sig: Take 1 tablet (500 mcg total) by mouth daily.   cyanocobalamin ((VITAMIN B-12)) injection 1,000 mcg    Orders Placed This Encounter  Procedures   Vitamin B12   VITAMIN D 25 Hydroxy (Vit-D Deficiency, Fractures)   Renal function panel   TSH   RPR    Patient instructions: Labs today - then B12 shot.  Memory testing overall stable.  Return in 2-3 months for physical.   Follow up plan: Return in about 3 months (around 10/03/2021), or if symptoms worsen or fail to improve, for annual exam, prior fasting for blood work.  [name], MD

## 2021-07-04 NOTE — Patient Instructions (Addendum)
Labs today - then B12 shot.  Memory testing overall stable.  Return in 2-3 months for physical.   Memory Compensation Strategies  Use "WARM" strategy.  W= write it down  A= associate it  R= repeat it  M= make a mental note  2.   You can keep a Social worker.  Use a 3-ring notebook with sections for the following: calendar, important names and phone numbers,  medications, doctors' names/phone numbers, lists/reminders, and a section to journal what you did  each day.   3.    Use a calendar to write appointments down.  4.    Write yourself a schedule for the day.  This can be placed on the calendar or in a separate section of the Memory Notebook.  Keeping a  regular schedule can help memory.  5.    Use medication organizer with sections for each day or morning/evening pills.  You may need help loading it  6.    Keep a basket, or pegboard by the door.  Place items that you need to take out with you in the basket or on the pegboard.  You may also want to  include a message board for reminders.  7.    Use sticky notes.  Place sticky notes with reminders in a place where the task is performed.  For example: " turn off the  stove" placed by the stove, "lock the door" placed on the door at eye level, " take your medications" on  the bathroom mirror or by the place where you normally take your medications.  8.    Use alarms/timers.  Use while cooking to remind yourself to check on food or as a reminder to take your medicine, or as a  reminder to make a call, or as a reminder to perform another task, etc.

## 2021-07-05 ENCOUNTER — Encounter: Payer: Self-pay | Admitting: Family Medicine

## 2021-07-05 LAB — RPR: RPR Ser Ql: NONREACTIVE

## 2021-07-05 MED ORDER — VITAMIN B-12 1000 MCG PO TABS: 1000.0000 ug | ORAL_TABLET | Freq: Every day | ORAL | Status: DC

## 2021-07-05 NOTE — Assessment & Plan Note (Signed)
Continues levothyroxine 165mcg daily - update TSH.

## 2021-07-05 NOTE — Assessment & Plan Note (Signed)
Update renal panel 

## 2021-07-05 NOTE — Assessment & Plan Note (Signed)
Update levels on vit D 1000 IU daily.  

## 2021-07-05 NOTE — Assessment & Plan Note (Addendum)
Endorses several months of memory deficits - predominantly short term memory. Fortunately memory testing today overall stable. Will start evaluation with labwork including B12, TSH. Will continue to monitor symptoms closely. Will consider home safety evaluation with recent falls endorsed.

## 2021-07-05 NOTE — Assessment & Plan Note (Signed)
He believes he's taking vitamin b12 519mcg daily replacement. Update levels on this. B12 shot today as well. Last B12 shot was 02/2021.  Depending on results, may need monthly B12 shots to maintain levels.

## 2021-07-06 ENCOUNTER — Telehealth: Payer: Self-pay

## 2021-07-06 DIAGNOSIS — M1710 Unilateral primary osteoarthritis, unspecified knee: Secondary | ICD-10-CM

## 2021-07-06 DIAGNOSIS — Z89412 Acquired absence of left great toe: Secondary | ICD-10-CM

## 2021-07-06 DIAGNOSIS — Z8673 Personal history of transient ischemic attack (TIA), and cerebral infarction without residual deficits: Secondary | ICD-10-CM

## 2021-07-06 DIAGNOSIS — M79604 Pain in right leg: Secondary | ICD-10-CM

## 2021-07-06 DIAGNOSIS — R296 Repeated falls: Secondary | ICD-10-CM

## 2021-07-06 DIAGNOSIS — Z96652 Presence of left artificial knee joint: Secondary | ICD-10-CM

## 2021-07-06 DIAGNOSIS — Z96611 Presence of right artificial shoulder joint: Secondary | ICD-10-CM

## 2021-07-06 DIAGNOSIS — M5136 Other intervertebral disc degeneration, lumbar region: Secondary | ICD-10-CM

## 2021-07-06 DIAGNOSIS — Z9889 Other specified postprocedural states: Secondary | ICD-10-CM

## 2021-07-06 DIAGNOSIS — Z96651 Presence of right artificial knee joint: Secondary | ICD-10-CM

## 2021-07-06 DIAGNOSIS — M21962 Unspecified acquired deformity of left lower leg: Secondary | ICD-10-CM

## 2021-07-06 DIAGNOSIS — Z96612 Presence of left artificial shoulder joint: Secondary | ICD-10-CM

## 2021-07-06 DIAGNOSIS — R200 Anesthesia of skin: Secondary | ICD-10-CM

## 2021-07-06 DIAGNOSIS — M48061 Spinal stenosis, lumbar region without neurogenic claudication: Secondary | ICD-10-CM

## 2021-07-06 DIAGNOSIS — G8929 Other chronic pain: Secondary | ICD-10-CM

## 2021-07-06 NOTE — Telephone Encounter (Signed)
Lvm asking pt to call back.  Need to relay results and Dr. Synthia Innocent message.   Labs/Dr. Synthia Innocent msg: Plz notify kidney function was improved, sugar normal, electrolytes, vitamin D and thyroid normal.  Vitamin B12 remains low normal despite 540mcg daily. Would ideally want monthly B12 shots for 6 months then reassess, however, if unable to come in for this due to our current location, recommend he increase b12 to 1020mcg daily.  Would also offer home health PT referral to complete a home safety evaluation given recent falls. Let me know if interested.

## 2021-07-10 NOTE — Telephone Encounter (Signed)
Spoke with pt relaying results and Dr. Synthia Innocent message.  Pt verbalizes understanding.  Scheduled 1st mthly vit B12 inj on 08/08/21 at 2:15 [facility]).  Also, pt agrees to PT referral.  Results mailed to patient per his request.

## 2021-07-10 NOTE — Telephone Encounter (Signed)
HH referral placed. 

## 2021-07-10 NOTE — Addendum Note (Signed)
Addended by: [name] on: 07/10/2021 06:30 PM   Modules accepted: Orders

## 2021-07-18 ENCOUNTER — Telehealth: Payer: Self-pay | Admitting: Family Medicine

## 2021-07-18 DIAGNOSIS — G43909 Migraine, unspecified, not intractable, without status migrainosus: Secondary | ICD-10-CM | POA: Diagnosis not present

## 2021-07-18 DIAGNOSIS — N1831 Chronic kidney disease, stage 3a: Secondary | ICD-10-CM | POA: Diagnosis not present

## 2021-07-18 DIAGNOSIS — K219 Gastro-esophageal reflux disease without esophagitis: Secondary | ICD-10-CM | POA: Diagnosis not present

## 2021-07-18 DIAGNOSIS — K449 Diaphragmatic hernia without obstruction or gangrene: Secondary | ICD-10-CM | POA: Diagnosis not present

## 2021-07-18 DIAGNOSIS — Z8673 Personal history of transient ischemic attack (TIA), and cerebral infarction without residual deficits: Secondary | ICD-10-CM | POA: Diagnosis not present

## 2021-07-18 DIAGNOSIS — E039 Hypothyroidism, unspecified: Secondary | ICD-10-CM | POA: Diagnosis not present

## 2021-07-18 DIAGNOSIS — I252 Old myocardial infarction: Secondary | ICD-10-CM | POA: Diagnosis not present

## 2021-07-18 DIAGNOSIS — G4733 Obstructive sleep apnea (adult) (pediatric): Secondary | ICD-10-CM | POA: Diagnosis not present

## 2021-07-18 DIAGNOSIS — E559 Vitamin D deficiency, unspecified: Secondary | ICD-10-CM | POA: Diagnosis not present

## 2021-07-18 DIAGNOSIS — Z89422 Acquired absence of other left toe(s): Secondary | ICD-10-CM | POA: Diagnosis not present

## 2021-07-18 DIAGNOSIS — I509 Heart failure, unspecified: Secondary | ICD-10-CM | POA: Diagnosis not present

## 2021-07-18 DIAGNOSIS — Z86010 Personal history of colonic polyps: Secondary | ICD-10-CM | POA: Diagnosis not present

## 2021-07-18 DIAGNOSIS — E538 Deficiency of other specified B group vitamins: Secondary | ICD-10-CM | POA: Diagnosis not present

## 2021-07-18 DIAGNOSIS — Z9181 History of falling: Secondary | ICD-10-CM | POA: Diagnosis not present

## 2021-07-18 DIAGNOSIS — M1990 Unspecified osteoarthritis, unspecified site: Secondary | ICD-10-CM | POA: Diagnosis not present

## 2021-07-18 DIAGNOSIS — Z89412 Acquired absence of left great toe: Secondary | ICD-10-CM | POA: Diagnosis not present

## 2021-07-18 NOTE — Telephone Encounter (Signed)
Home Health verbal orders [place] [facility] Name: [facility]   Callback number: [phone]  Requesting OT/PT/Skilled nursing/Social Work/Speech:  Reason:PT  Frequency:1x week 6  Please forward to [facility] pool or providers CMA

## 2021-07-18 NOTE — Telephone Encounter (Signed)
Agree with this. Thank you.  

## 2021-07-19 NOTE — Telephone Encounter (Signed)
Spoke with [facility] informing her Dr. Darnell Level is giving verbal orders for services requested for pt.

## 2021-07-24 ENCOUNTER — Telehealth: Payer: Self-pay | Admitting: Family Medicine

## 2021-07-24 NOTE — Telephone Encounter (Signed)
Nicholas [name] form [facility] called in stated she needs Confirmation on Pt diagnosis . Would like a call back #[phone]

## 2021-07-25 NOTE — Telephone Encounter (Signed)
Attempted to return Shemena's call.  No answer.  Vm box is full.

## 2021-07-27 ENCOUNTER — Telehealth: Payer: Self-pay

## 2021-07-27 NOTE — Progress Notes (Addendum)
Chronic Care Management Pharmacy Assistant   Name: Nicholas [name]  MRN: [id number] DOB: [birth year]  Reason for Encounter: Medication Adherence and Delivery Coordination   08/01/2021 - Spoke with patient in regards to Vitamin B12 and his Gabapentin. Patient states he gets Vitamin B12 injections monthly in Dr. Bosie Clos office. He does not take Vit B12 tablets. Patient would like to wait for his visit on 11/04 before increasing or adding any new medication. Patient will discuss Neuropathy pain with Dr. Danise Mina.   Recent office visits:  07/04/2021 - [name], MD - Patient presented for memory loss. Labs: Renal function, RPR, TSH, Vit B and Vit D. Increase vitamin B-12 (CYANOCOBALAMIN) to 1000 MCG tablet.   Recent consult visits:  None since last CCM contact  Hospital visits:  None since last CCM contact  Medications: Outpatient Encounter Medications as of 07/27/2021  Medication Sig   acetaminophen (TYLENOL) 500 MG tablet Take 1,000 mg by mouth 2 (two) times daily. IN THE AFTERNOON & AT NIGHT   amitriptyline (ELAVIL) 50 MG tablet TAKE 1-2 TABLETS BY MOUTH AT BEDTIME.   atorvastatin (LIPITOR) 20 MG tablet Take 1 tablet (20 mg total) by mouth daily.   Cholecalciferol (VITAMIN D3) 25 MCG (1000 UT) CAPS Take 1 capsule (1,000 Units total) by mouth daily.   clotrimazole (LOTRIMIN) 1 % cream Apply 1 application topically 2 (two) times daily. To groin (Patient taking differently: Apply 1 application topically 3 (three) times daily. To groin)   divalproex (DEPAKOTE ER) 500 MG 24 hr tablet Take 1 tablet (500 mg total) by mouth at bedtime.   EPINEPHrine 0.3 mg/0.3 mL IJ SOAJ injection Inject 0.3 mLs (0.3 mg total) into the muscle as needed for anaphylaxis. (bee stings)   ferrous sulfate 325 (65 FE) MG EC tablet Take 325 mg by mouth every other day.   furosemide (LASIX) 20 MG tablet TAKE 1 TABLET BY MOUTH EVERY DAY AS NEEDED FOR FLUID OR EDEMA.   gabapentin (NEURONTIN) 300 MG capsule  Take 1 capsule (300 mg total) by mouth in the morning AND 2 capsules (600 mg total) 2 (two) times daily. (lunch and dinner).   ketoconazole (NIZORAL) 2 % shampoo Apply 1 application topically 3 (three) times a week.   levothyroxine (SYNTHROID) 100 MCG tablet Take 1 tablet (100 mcg total) by mouth daily before breakfast.   metoprolol succinate (TOPROL-XL) 100 MG 24 hr tablet Take 1 tablet (100 mg total) by mouth daily. Take with or immediately following a meal.   nitroGLYCERIN (NITROSTAT) 0.4 MG SL tablet Place 1 tablet (0.4 mg total) under the tongue every 5 (five) minutes as needed for chest pain.   ondansetron (ZOFRAN ODT) 4 MG disintegrating tablet Take 1 tablet (4 mg total) by mouth every 8 (eight) hours as needed.   pantoprazole (PROTONIX) 40 MG tablet TAKE 1 TABLET BY MOUTH EVERY DAY   tiZANidine (ZANAFLEX) 4 MG tablet Take 1 tablet (4 mg total) by mouth every 6 (six) hours as needed for muscle spasms.   triamcinolone (KENALOG) 0.1 % Apply 1 application topically 2 (two) times daily. Apply to AA.   Ubrogepant (UBRELVY) 50 MG TABS Take 50 mg by mouth daily as needed.   vitamin B-12 (CYANOCOBALAMIN) 1000 MCG tablet Take 1 tablet (1,000 mcg total) by mouth daily.   No facility-administered encounter medications on file as of 07/27/2021.   BP Readings from Last 3 Encounters:  07/04/21 120/70  03/01/21 121/84  03/01/21 120/72    Lab Results  Component Value Date  HGBA1C 5.6 07/13/2010    Recent OV, Consult or Hospital visit:  Recent medication changes indicated:  vitamin B-12 (CYANOCOBALAMIN) 1000 MCG tablet  Last adherence delivery date: 07/09/2021   Patient is due for next adherence delivery on: 08/08/2021  Spoke with patient on 08/01/2021 and reviewed medications. Patient denied the need for any medications at this time.  This delivery to include: Adherence Packaging  30 Days  Packs: Gabapentin 300mg  take 1 tablet breakfast, 2 tablets lunch,2 tablets evening meal Furosemide  20mg   take 1 tablet daily breakfast  Pantoprazole 40mg  take 1 tablet at breakfast Atorvastatin 20mg  take 1 tablet at breakfast Ferrous sulfate 324 mg  take 1 tablet at breakfast every other day  Vitamin D3 68mcg take 1 capsule breakfast Levothyroxine 147mcg take 1 tablet at breakfast Metoprolol 100mg   take 1 tablet at bedtime  Amitriptyline 50mg  take 2 tablets at bedtime  Patient declined the following medications this month: Divalproex - discontinued Ubrelvy - PRN  Any concerns about your medications? Yes - Patient is still in a great deal of pain with his legs and feet. He feels the Gabapentin is not helping as it should. Both feet stay numb all the time. Dr. Danise Mina told him he has neuropathy. Average pain level is 5/10 after he takes his medication. Before he takes the medication it is a 8/10. Verified patient is taking his Gabapentin as directed. Patient also takes Acetaminophen 500mg  and he takes 2 in morning; 2 at lunch; 2 at dinner to offset the pain.   How often do you forget or accidentally miss a dose? Never  Do you use a pillbox? No  Is patient in packaging Yes  If yes  What is the date on your next pill pack? 08/01/2021 Lunch   Any concerns or issues with your packaging? No  Refills requested from providers include:Furosemide 20mg   take 1 tablet daily breakfast   Confirmed delivery date of 08/08/2021, advised patient that pharmacy will contact them the morning of delivery.  Recent blood pressure readings are as follows: Patient does not take readings at home.  Recent blood glucose readings are as follows: Patient does not take readings at home.   Annual wellness visit in last year? Yes Most Recent BP reading: 120/70 on 07/04/2021  [name], CPP notified  Marijean Niemann, [place] Assistant [phone]   I have reviewed the care management and care coordination activities outlined in this encounter and I am certifying that I agree with the content  of this note. See addendum.  [name], PharmD Clinical Pharmacist [facility] at [facility] [phone]

## 2021-07-27 NOTE — Telephone Encounter (Signed)
Open in error

## 2021-07-27 NOTE — Telephone Encounter (Signed)
Attempted to return Shemena's call.  No answer.  Vm box is full.

## 2021-07-28 DIAGNOSIS — E538 Deficiency of other specified B group vitamins: Secondary | ICD-10-CM | POA: Diagnosis not present

## 2021-07-28 DIAGNOSIS — K449 Diaphragmatic hernia without obstruction or gangrene: Secondary | ICD-10-CM | POA: Diagnosis not present

## 2021-07-28 DIAGNOSIS — Z89412 Acquired absence of left great toe: Secondary | ICD-10-CM | POA: Diagnosis not present

## 2021-07-28 DIAGNOSIS — K219 Gastro-esophageal reflux disease without esophagitis: Secondary | ICD-10-CM | POA: Diagnosis not present

## 2021-07-28 DIAGNOSIS — M1990 Unspecified osteoarthritis, unspecified site: Secondary | ICD-10-CM | POA: Diagnosis not present

## 2021-07-28 DIAGNOSIS — Z9181 History of falling: Secondary | ICD-10-CM | POA: Diagnosis not present

## 2021-07-28 DIAGNOSIS — E039 Hypothyroidism, unspecified: Secondary | ICD-10-CM | POA: Diagnosis not present

## 2021-07-28 DIAGNOSIS — G43909 Migraine, unspecified, not intractable, without status migrainosus: Secondary | ICD-10-CM | POA: Diagnosis not present

## 2021-07-28 DIAGNOSIS — Z8673 Personal history of transient ischemic attack (TIA), and cerebral infarction without residual deficits: Secondary | ICD-10-CM | POA: Diagnosis not present

## 2021-07-28 DIAGNOSIS — E559 Vitamin D deficiency, unspecified: Secondary | ICD-10-CM | POA: Diagnosis not present

## 2021-07-28 DIAGNOSIS — Z86010 Personal history of colonic polyps: Secondary | ICD-10-CM | POA: Diagnosis not present

## 2021-07-28 DIAGNOSIS — G4733 Obstructive sleep apnea (adult) (pediatric): Secondary | ICD-10-CM | POA: Diagnosis not present

## 2021-07-28 DIAGNOSIS — I252 Old myocardial infarction: Secondary | ICD-10-CM | POA: Diagnosis not present

## 2021-07-28 DIAGNOSIS — N1831 Chronic kidney disease, stage 3a: Secondary | ICD-10-CM | POA: Diagnosis not present

## 2021-07-28 DIAGNOSIS — Z89422 Acquired absence of other left toe(s): Secondary | ICD-10-CM | POA: Diagnosis not present

## 2021-07-28 DIAGNOSIS — I509 Heart failure, unspecified: Secondary | ICD-10-CM | POA: Diagnosis not present

## 2021-07-28 NOTE — Telephone Encounter (Signed)
Spoke with Earnestine Leys about pt's dx codes.  I relayed all codes in referral for [facility].  She documented info and expressed her thanks.

## 2021-08-01 DIAGNOSIS — E559 Vitamin D deficiency, unspecified: Secondary | ICD-10-CM | POA: Diagnosis not present

## 2021-08-01 DIAGNOSIS — I252 Old myocardial infarction: Secondary | ICD-10-CM | POA: Diagnosis not present

## 2021-08-01 DIAGNOSIS — G4733 Obstructive sleep apnea (adult) (pediatric): Secondary | ICD-10-CM | POA: Diagnosis not present

## 2021-08-01 DIAGNOSIS — G43909 Migraine, unspecified, not intractable, without status migrainosus: Secondary | ICD-10-CM | POA: Diagnosis not present

## 2021-08-01 DIAGNOSIS — N1831 Chronic kidney disease, stage 3a: Secondary | ICD-10-CM | POA: Diagnosis not present

## 2021-08-01 DIAGNOSIS — Z9181 History of falling: Secondary | ICD-10-CM | POA: Diagnosis not present

## 2021-08-01 DIAGNOSIS — M1990 Unspecified osteoarthritis, unspecified site: Secondary | ICD-10-CM | POA: Diagnosis not present

## 2021-08-01 DIAGNOSIS — Z89422 Acquired absence of other left toe(s): Secondary | ICD-10-CM | POA: Diagnosis not present

## 2021-08-01 DIAGNOSIS — Z86010 Personal history of colonic polyps: Secondary | ICD-10-CM | POA: Diagnosis not present

## 2021-08-01 DIAGNOSIS — K449 Diaphragmatic hernia without obstruction or gangrene: Secondary | ICD-10-CM | POA: Diagnosis not present

## 2021-08-01 DIAGNOSIS — E039 Hypothyroidism, unspecified: Secondary | ICD-10-CM | POA: Diagnosis not present

## 2021-08-01 DIAGNOSIS — K219 Gastro-esophageal reflux disease without esophagitis: Secondary | ICD-10-CM | POA: Diagnosis not present

## 2021-08-01 DIAGNOSIS — Z8673 Personal history of transient ischemic attack (TIA), and cerebral infarction without residual deficits: Secondary | ICD-10-CM | POA: Diagnosis not present

## 2021-08-01 DIAGNOSIS — I509 Heart failure, unspecified: Secondary | ICD-10-CM | POA: Diagnosis not present

## 2021-08-01 DIAGNOSIS — Z89412 Acquired absence of left great toe: Secondary | ICD-10-CM | POA: Diagnosis not present

## 2021-08-01 DIAGNOSIS — E538 Deficiency of other specified B group vitamins: Secondary | ICD-10-CM | POA: Diagnosis not present

## 2021-08-01 NOTE — Telephone Encounter (Addendum)
Reviewed patient concerns about neuropathy. Did he increase his vitamin B12 from 500 mcg to 1000 mcg after last PCP visit? Low B12 can worsen his neuropathy. Would he like [place] to put this in his packs? Would like to wait until PCP visit 11/4 and update labs prior to changing his gabapentin dose or consider starting new medication.  [name], PharmD Clinical Pharmacist [place] [facility] at [facility] [phone]

## 2021-08-01 NOTE — Addendum Note (Signed)
Addended by: [name] on: 08/01/2021 03:10 PM   Modules accepted: Orders

## 2021-08-01 NOTE — Telephone Encounter (Signed)
Sounds good, I see Dr. Darnell Level said ideally injections monthly and if not can increase B12 tablets to 1000 mcg. Will remove the tabs from his med list.  Thank you,  [name], PharmD Clinical Pharmacist [place] [facility] at [facility] [phone]

## 2021-08-04 ENCOUNTER — Telehealth: Payer: Self-pay

## 2021-08-04 MED ORDER — FUROSEMIDE 20 MG PO TABS: ORAL_TABLET | ORAL | 0 refills | Status: DC

## 2021-08-04 NOTE — Telephone Encounter (Signed)
[place], [place], [place] Patient requesting refill on furosemide. [facility] states they have faxed a request for refill but I know you guys have been a little crazy with the temporary move. Will you please send refill if appropriate to [facility]?   Thank you,    Margaretmary Dys, [place] Pharmacy Assistant  [phone]

## 2021-08-04 NOTE — Telephone Encounter (Signed)
E-scribe refill

## 2021-08-08 ENCOUNTER — Ambulatory Visit (INDEPENDENT_AMBULATORY_CARE_PROVIDER_SITE_OTHER): Payer: Medicare Other

## 2021-08-08 ENCOUNTER — Other Ambulatory Visit: Payer: Self-pay

## 2021-08-08 DIAGNOSIS — E538 Deficiency of other specified B group vitamins: Secondary | ICD-10-CM | POA: Diagnosis not present

## 2021-08-08 MED ORDER — CYANOCOBALAMIN 1000 MCG/ML IJ SOLN
1000.0000 ug | Freq: Once | INTRAMUSCULAR | Status: AC
  Administered 2021-08-08: 1000 ug via INTRAMUSCULAR

## 2021-08-08 NOTE — Progress Notes (Signed)
Per orders of Dr. Gutierrez, injection of monthly B12 1000 mcg/ml given by Camden Mazzaferro P Nimah Uphoff, CMA in left deltoid. Patient tolerated injection well.  

## 2021-08-16 DIAGNOSIS — E559 Vitamin D deficiency, unspecified: Secondary | ICD-10-CM | POA: Diagnosis not present

## 2021-08-16 DIAGNOSIS — E538 Deficiency of other specified B group vitamins: Secondary | ICD-10-CM | POA: Diagnosis not present

## 2021-08-16 DIAGNOSIS — K219 Gastro-esophageal reflux disease without esophagitis: Secondary | ICD-10-CM | POA: Diagnosis not present

## 2021-08-16 DIAGNOSIS — K449 Diaphragmatic hernia without obstruction or gangrene: Secondary | ICD-10-CM | POA: Diagnosis not present

## 2021-08-16 DIAGNOSIS — I252 Old myocardial infarction: Secondary | ICD-10-CM | POA: Diagnosis not present

## 2021-08-16 DIAGNOSIS — G4733 Obstructive sleep apnea (adult) (pediatric): Secondary | ICD-10-CM | POA: Diagnosis not present

## 2021-08-16 DIAGNOSIS — Z9181 History of falling: Secondary | ICD-10-CM | POA: Diagnosis not present

## 2021-08-16 DIAGNOSIS — E039 Hypothyroidism, unspecified: Secondary | ICD-10-CM | POA: Diagnosis not present

## 2021-08-16 DIAGNOSIS — I509 Heart failure, unspecified: Secondary | ICD-10-CM | POA: Diagnosis not present

## 2021-08-16 DIAGNOSIS — Z89412 Acquired absence of left great toe: Secondary | ICD-10-CM | POA: Diagnosis not present

## 2021-08-16 DIAGNOSIS — Z86010 Personal history of colonic polyps: Secondary | ICD-10-CM | POA: Diagnosis not present

## 2021-08-16 DIAGNOSIS — Z8673 Personal history of transient ischemic attack (TIA), and cerebral infarction without residual deficits: Secondary | ICD-10-CM | POA: Diagnosis not present

## 2021-08-16 DIAGNOSIS — M1990 Unspecified osteoarthritis, unspecified site: Secondary | ICD-10-CM | POA: Diagnosis not present

## 2021-08-16 DIAGNOSIS — G43909 Migraine, unspecified, not intractable, without status migrainosus: Secondary | ICD-10-CM | POA: Diagnosis not present

## 2021-08-16 DIAGNOSIS — Z89422 Acquired absence of other left toe(s): Secondary | ICD-10-CM | POA: Diagnosis not present

## 2021-08-16 DIAGNOSIS — N1831 Chronic kidney disease, stage 3a: Secondary | ICD-10-CM | POA: Diagnosis not present

## 2021-08-18 ENCOUNTER — Ambulatory Visit: Payer: Medicare Other | Admitting: Family Medicine

## 2021-08-21 ENCOUNTER — Other Ambulatory Visit: Payer: Self-pay | Admitting: Family Medicine

## 2021-08-21 NOTE — Telephone Encounter (Signed)
Refill request Gabapentin Last refill 03/29/21 #450/1 Last office visit 07/04/21

## 2021-08-23 DIAGNOSIS — Z8673 Personal history of transient ischemic attack (TIA), and cerebral infarction without residual deficits: Secondary | ICD-10-CM | POA: Diagnosis not present

## 2021-08-23 DIAGNOSIS — K449 Diaphragmatic hernia without obstruction or gangrene: Secondary | ICD-10-CM | POA: Diagnosis not present

## 2021-08-23 DIAGNOSIS — I509 Heart failure, unspecified: Secondary | ICD-10-CM | POA: Diagnosis not present

## 2021-08-23 DIAGNOSIS — Z89422 Acquired absence of other left toe(s): Secondary | ICD-10-CM | POA: Diagnosis not present

## 2021-08-23 DIAGNOSIS — E039 Hypothyroidism, unspecified: Secondary | ICD-10-CM | POA: Diagnosis not present

## 2021-08-23 DIAGNOSIS — I252 Old myocardial infarction: Secondary | ICD-10-CM | POA: Diagnosis not present

## 2021-08-23 DIAGNOSIS — K219 Gastro-esophageal reflux disease without esophagitis: Secondary | ICD-10-CM | POA: Diagnosis not present

## 2021-08-23 DIAGNOSIS — Z89412 Acquired absence of left great toe: Secondary | ICD-10-CM | POA: Diagnosis not present

## 2021-08-23 DIAGNOSIS — G43909 Migraine, unspecified, not intractable, without status migrainosus: Secondary | ICD-10-CM | POA: Diagnosis not present

## 2021-08-23 DIAGNOSIS — Z9181 History of falling: Secondary | ICD-10-CM | POA: Diagnosis not present

## 2021-08-23 DIAGNOSIS — Z86010 Personal history of colonic polyps: Secondary | ICD-10-CM | POA: Diagnosis not present

## 2021-08-23 DIAGNOSIS — N1831 Chronic kidney disease, stage 3a: Secondary | ICD-10-CM | POA: Diagnosis not present

## 2021-08-23 DIAGNOSIS — E538 Deficiency of other specified B group vitamins: Secondary | ICD-10-CM | POA: Diagnosis not present

## 2021-08-23 DIAGNOSIS — M1990 Unspecified osteoarthritis, unspecified site: Secondary | ICD-10-CM | POA: Diagnosis not present

## 2021-08-23 DIAGNOSIS — E559 Vitamin D deficiency, unspecified: Secondary | ICD-10-CM | POA: Diagnosis not present

## 2021-08-23 DIAGNOSIS — G4733 Obstructive sleep apnea (adult) (pediatric): Secondary | ICD-10-CM | POA: Diagnosis not present

## 2021-08-24 ENCOUNTER — Telehealth: Payer: Self-pay

## 2021-08-24 NOTE — Chronic Care Management (AMB) (Addendum)
Chronic Care Management Pharmacy Assistant   Name: Nicholas [name]  MRN: [id number] DOB: 17-Feb-1951  Reason for Encounter: Medication Adherence and Delivery Coordination  Recent office visits:  07/04/21 - PCP - Patient presented for memory loss. Labs ordered. B12 low, B12 shot given. Follow up 3 months. Offered home health referral to evaluate safety at home.  Recent consult visits:  None since last CCM contact  Hospital visits:  None in previous 6 months  Medications: Outpatient Encounter Medications as of 08/24/2021  Medication Sig   acetaminophen (TYLENOL) 500 MG tablet Take 1,000 mg by mouth 2 (two) times daily. IN THE AFTERNOON & AT NIGHT   amitriptyline (ELAVIL) 50 MG tablet TAKE 1-2 TABLETS BY MOUTH AT BEDTIME.   atorvastatin (LIPITOR) 20 MG tablet Take 1 tablet (20 mg total) by mouth daily.   Cholecalciferol (VITAMIN D3) 25 MCG (1000 UT) CAPS Take 1 capsule (1,000 Units total) by mouth daily.   clotrimazole (LOTRIMIN) 1 % cream Apply 1 application topically 2 (two) times daily. To groin (Patient taking differently: Apply 1 application topically 3 (three) times daily. To groin)   divalproex (DEPAKOTE ER) 500 MG 24 hr tablet Take 1 tablet (500 mg total) by mouth at bedtime.   EPINEPHrine 0.3 mg/0.3 mL IJ SOAJ injection Inject 0.3 mLs (0.3 mg total) into the muscle as needed for anaphylaxis. (bee stings)   ferrous sulfate 325 (65 FE) MG EC tablet Take 325 mg by mouth every other day.   furosemide (LASIX) 20 MG tablet TAKE 1 TABLET BY MOUTH EVERY DAY AS NEEDED FOR FLUID OR EDEMA.   gabapentin (NEURONTIN) 300 MG capsule TAKE ONE CAPSULE BY MOUTH EVERY MORNING and TAKE TWO CAPSULES AT NOON and TAKE TWO CAPSULES EVERY EVENING   ketoconazole (NIZORAL) 2 % shampoo Apply 1 application topically 3 (three) times a week.   levothyroxine (SYNTHROID) 100 MCG tablet Take 1 tablet (100 mcg total) by mouth daily before breakfast.   metoprolol succinate (TOPROL-XL) 100 MG 24 hr tablet Take 1  tablet (100 mg total) by mouth daily. Take with or immediately following a meal.   nitroGLYCERIN (NITROSTAT) 0.4 MG SL tablet Place 1 tablet (0.4 mg total) under the tongue every 5 (five) minutes as needed for chest pain.   ondansetron (ZOFRAN ODT) 4 MG disintegrating tablet Take 1 tablet (4 mg total) by mouth every 8 (eight) hours as needed.   pantoprazole (PROTONIX) 40 MG tablet TAKE 1 TABLET BY MOUTH EVERY DAY   tiZANidine (ZANAFLEX) 4 MG tablet Take 1 tablet (4 mg total) by mouth every 6 (six) hours as needed for muscle spasms.   triamcinolone (KENALOG) 0.1 % Apply 1 application topically 2 (two) times daily. Apply to AA.   Ubrogepant (UBRELVY) 50 MG TABS Take 50 mg by mouth daily as needed.   No facility-administered encounter medications on file as of 08/24/2021.   BP Readings from Last 3 Encounters:  07/04/21 120/70  03/01/21 121/84  03/01/21 120/72    Lab Results  Component Value Date   HGBA1C 5.6 07/13/2010      Recent OV, Consult or Hospital visit:  No medication changes indicated   Last adherence delivery date:07/07/21      Patient is due for next adherence delivery on: 09/05/21  Spoke with patient on 08/24/21 reviewed medications and coordinated delivery.  This delivery to include: Adherence Packaging  30 Days  Packs: Gabapentin 300mg  - take 1 tablet breakfast, 2 tablets lunch,2 tablets evening meal Furosemide 20mg  - take 1 tablet  daily breakfast *pt takes daily not as needed* Pantoprazole 40mg  - take 1 tablet at breakfast Atorvastatin 20mg  - take 1 tablet at breakfast Vitamin D3 43mcg - take 1 capsule breakfast Ferrous sulfate 324 mg - take 1 tablet at breakfast *pt reports now taking daily instead of every other day* Metoprolol succinate 100mg  - take 1 tablet at bedtime  Levothyroxine 141mcg - take 1 tablet at breakfast Amitriptyline 50mg  - take 2 tablets at bedtime  VIAL medications: none  Patient declined the following medications this month: Ubrelvy -  PRN   Any concerns about your medications? No  How often do you forget or accidentally miss a dose? Never  Do you use a pillbox? No  Is patient in packaging Yes  If yes  What is the date on your next pill pack? Patient not near medication to verify date  Any concerns or issues with your packaging? Happy with packaging   No refill request needed.  Confirmed delivery date of 09/05/21, advised patient that pharmacy will contact them the morning of delivery.  Recent blood pressure readings are as follows: no readings available  Annual wellness visit in last year? Yes Most Recent BP reading: 120/70 55-P  07/04/21  [name], CPP notified  [name], [place] Assistant [phone]  I have reviewed the care management and care coordination activities outlined in this encounter and I am certifying that I agree with the content of this note. No further action required.  [name], PharmD Clinical Pharmacist [facility] [place] [facility] at [facility] [phone]

## 2021-08-25 ENCOUNTER — Encounter: Payer: Self-pay | Admitting: Family Medicine

## 2021-08-25 DIAGNOSIS — R296 Repeated falls: Secondary | ICD-10-CM | POA: Insufficient documentation

## 2021-09-13 ENCOUNTER — Ambulatory Visit (INDEPENDENT_AMBULATORY_CARE_PROVIDER_SITE_OTHER): Payer: Medicare Other

## 2021-09-13 ENCOUNTER — Other Ambulatory Visit: Payer: Self-pay

## 2021-09-13 DIAGNOSIS — E538 Deficiency of other specified B group vitamins: Secondary | ICD-10-CM

## 2021-09-13 MED ORDER — CYANOCOBALAMIN 1000 MCG/ML IJ SOLN
1000.0000 ug | Freq: Once | INTRAMUSCULAR | Status: AC
Start: 2021-09-13 — End: 2021-09-13
  Administered 2021-09-13: 1000 ug via INTRAMUSCULAR

## 2021-09-13 NOTE — Progress Notes (Signed)
Per orders of Dr. Danise Mina, an injection of B12 was given by Ophelia Shoulder, CMA in right deltoid.  Patient tolerated injection well.

## 2021-09-21 ENCOUNTER — Telehealth: Payer: Self-pay

## 2021-09-21 NOTE — Chronic Care Management (AMB) (Addendum)
Chronic Care Management Pharmacy Assistant   Name: Nicholas [name]  MRN: [id number] DOB: Jul 31, 1951   Reason for Encounter: Medication Adherence and Delivery Coordination    Recent office visits:  09/13/21-Family Medicine-Patient presented for B12 injection-no medication changes   Recent consult visits:  None since last CCM contact  Hospital visits:  None in previous 6 months  Medications: Outpatient Encounter Medications as of 09/21/2021  Medication Sig   acetaminophen (TYLENOL) 500 MG tablet Take 1,000 mg by mouth 2 (two) times daily. IN THE AFTERNOON & AT NIGHT   amitriptyline (ELAVIL) 50 MG tablet TAKE 1-2 TABLETS BY MOUTH AT BEDTIME.   atorvastatin (LIPITOR) 20 MG tablet Take 1 tablet (20 mg total) by mouth daily.   Cholecalciferol (VITAMIN D3) 25 MCG (1000 UT) CAPS Take 1 capsule (1,000 Units total) by mouth daily.   clotrimazole (LOTRIMIN) 1 % cream Apply 1 application topically 2 (two) times daily. To groin (Patient taking differently: Apply 1 application topically 3 (three) times daily. To groin)   divalproex (DEPAKOTE ER) 500 MG 24 hr tablet Take 1 tablet (500 mg total) by mouth at bedtime.   EPINEPHrine 0.3 mg/0.3 mL IJ SOAJ injection Inject 0.3 mLs (0.3 mg total) into the muscle as needed for anaphylaxis. (bee stings)   ferrous sulfate 325 (65 FE) MG EC tablet Take 325 mg by mouth every other day.   furosemide (LASIX) 20 MG tablet TAKE 1 TABLET BY MOUTH EVERY DAY AS NEEDED FOR FLUID OR EDEMA.   gabapentin (NEURONTIN) 300 MG capsule TAKE ONE CAPSULE BY MOUTH EVERY MORNING and TAKE TWO CAPSULES AT NOON and TAKE TWO CAPSULES EVERY EVENING   ketoconazole (NIZORAL) 2 % shampoo Apply 1 application topically 3 (three) times a week.   levothyroxine (SYNTHROID) 100 MCG tablet Take 1 tablet (100 mcg total) by mouth daily before breakfast.   metoprolol succinate (TOPROL-XL) 100 MG 24 hr tablet Take 1 tablet (100 mg total) by mouth daily. Take with or immediately following a meal.    nitroGLYCERIN (NITROSTAT) 0.4 MG SL tablet Place 1 tablet (0.4 mg total) under the tongue every 5 (five) minutes as needed for chest pain.   ondansetron (ZOFRAN ODT) 4 MG disintegrating tablet Take 1 tablet (4 mg total) by mouth every 8 (eight) hours as needed.   pantoprazole (PROTONIX) 40 MG tablet TAKE 1 TABLET BY MOUTH EVERY DAY   tiZANidine (ZANAFLEX) 4 MG tablet Take 1 tablet (4 mg total) by mouth every 6 (six) hours as needed for muscle spasms.   triamcinolone (KENALOG) 0.1 % Apply 1 application topically 2 (two) times daily. Apply to AA.   Ubrogepant (UBRELVY) 50 MG TABS Take 50 mg by mouth daily as needed.   No facility-administered encounter medications on file as of 09/21/2021.   BP Readings from Last 3 Encounters:  07/04/21 120/70  03/01/21 121/84  03/01/21 120/72    Lab Results  Component Value Date   HGBA1C 5.6 07/13/2010      Recent OV, Consult or Hospital visit:   09/13/21- Family medicine  B12 shot.   Last adherence delivery date:09/05/21      Patient is due for next adherence delivery on: 10/05/21  Spoke with patient on 10/05/21 reviewed medications and coordinated delivery.  This delivery to include: Adherence Packaging  30 Days  Packs: Gabapentin 300mg  - take 1 tablet breakfast, 2 tablets lunch,2 tablets evening meal Furosemide 20mg  - take 1 tablet daily breakfast  daily Pantoprazole 40mg  - take 1 tablet at breakfast Atorvastatin 20mg  -  take 1 tablet at breakfast Vitamin D3 72mcg - take 1 capsule breakfast Ferrous sulfate 324 mg - take 1 tablet at breakfast  daily  Metoprolol succinate 100mg  - take 1 tablet at bedtime  Levothyroxine 138mcg - take 1 tablet at breakfast Amitriptyline 50mg  - take 2 tablets at bedtime  VIAL medications: none   Patient declined the following medications this month: Roselyn Meier- use as needed Divalproex - not taking  Any concerns about your medications? No  How often do you forget or accidentally miss a dose? Never  Is  patient in packaging Yes  What is the date on your next pill pack? 09/02/21  Any concerns or issues with your packaging?  The patient is happy with the packaging process.  No refill request needed.  Confirmed delivery date of 10/05/21, advised patient that pharmacy will contact them the morning of delivery.  Recent blood pressure readings are as follows:  The patient reported last  week morning reading of 122/76.  Annual wellness visit in last year? Yes Most Recent BP reading: 120/70  55-P  07/04/21   [name], CPP notified  [name], [place] Assistant [phone]  I have reviewed the care management and care coordination activities outlined in this encounter and I am certifying that I agree with the content of this note. No further action required.  [name], PharmD Clinical Pharmacist [place] [facility] at [facility] [phone]

## 2021-09-25 NOTE — Telephone Encounter (Signed)
Patient has been off divalproex for > 6 months (self-discontinued - was taking inappropriately, PRN migraines prior). Neurology aware. Removed from med list to prevent errors.  [name], PharmD Clinical Pharmacist Practitioner [place] [facility] at [facility] [phone]

## 2021-09-25 NOTE — Addendum Note (Signed)
Addended by: [name] on: 09/25/2021 11:28 AM   Modules accepted: Orders

## 2021-10-02 ENCOUNTER — Encounter: Payer: Self-pay | Admitting: Orthopedic Surgery

## 2021-10-02 ENCOUNTER — Other Ambulatory Visit: Payer: Self-pay

## 2021-10-02 ENCOUNTER — Ambulatory Visit (INDEPENDENT_AMBULATORY_CARE_PROVIDER_SITE_OTHER): Payer: Medicare Other | Admitting: Orthopedic Surgery

## 2021-10-02 DIAGNOSIS — L97529 Non-pressure chronic ulcer of other part of left foot with unspecified severity: Secondary | ICD-10-CM

## 2021-10-02 NOTE — Progress Notes (Signed)
Office Visit Note   Patient: Nicholas [name]           Date of Birth: 1950/11/03           MRN: [id number] Visit Date: 10/02/2021              Requested by: [name], MD [address] [place],  [place] [address] PCP: [name], MD  Chief Complaint  Patient presents with   Left Foot - Pain      HPI: Patient is a 70 year old gentleman who is seen for initial evaluation for ulcer beneath the first metatarsal head left foot.  Patient is status post great toe and second toe amputation on the left.  He has orthotics from [facility].  Assessment & Plan: Visit Diagnoses:  1. Chronic ulcer of toe of left foot, unspecified ulcer stage ([place])     Plan: A felt relieving pad was placed under his custom orthotic.  We will set him up with vascular vein surgery for ankle-brachial indices for both lower extremities.  Reevaluate in 4 weeks.  Follow-Up Instructions: Return in about 4 weeks (around 10/30/2021).   Ortho Exam  Patient is alert, oriented, no adenopathy, well-dressed, normal affect, normal respiratory effort. Examination patient has a strongly palpable pulse on the right weak palpable pulse on the left.  The Doppler was used and patient has a strong anterior tibial pulse however the posterior tibial and dorsalis pedis pulses are weaker and biphasic.  Patient has posterior tibial tendon insufficiency on the left with pronation and valgus and overloading the first metatarsal head which now has an ulcer.  After informed consent a 10 blade knife was used to debride the skin and soft tissue back to healthy viable tissue.  After debridement the ulcer is 2 cm in diameter 2 mm deep before debridement the ulcer is 1 cm in diameter.  Imaging: No results found. No images are attached to the encounter.  Labs: Lab Results  Component Value Date   HGBA1C 5.6 07/13/2010   HGBA1C  12/08/2008    5.2 (NOTE)   The ADA recommends the following therapeutic goal for  glycemic   control related to Hgb A1C measurement:   Goal of Therapy:   < 7.0% Hgb A1C   Reference: American Diabetes Association: Clinical Practice   Recommendations 2008, Diabetes Care,  2008, 31:(Suppl 1).   HGBA1C  11/23/2008    5.1 (NOTE)   The ADA recommends the following therapeutic goal for glycemic   control related to Hgb A1C measurement:   Goal of Therapy:   < 7.0% Hgb A1C   Reference: American Diabetes Association: Clinical Practice   Recommendations 2008, Diabetes Care,  2008, 31:(Suppl 1).   ESRSEDRATE 28 (H) 03/01/2021   ESRSEDRATE 18 08/03/2020   ESRSEDRATE 22 (H) 01/11/2012   LABURIC 8.8 (H) 03/01/2021   LABURIC 5.0 02/08/2016   REPTSTATUS 05/07/2018 FINAL 05/05/2018   GRAMSTAIN  03/29/2009    NO WBC SEEN NO SQUAMOUS EPITHELIAL CELLS SEEN NO ORGANISMS SEEN   GRAMSTAIN  03/29/2009    NO WBC SEEN NO SQUAMOUS EPITHELIAL CELLS SEEN NO ORGANISMS SEEN   CULT (A) 05/05/2018    <10,000 COLONIES/mL INSIGNIFICANT GROWTH Performed at [facility], [place] [address], [place], [place] [address]    LABORGA STAPHYLOCOCCUS SPECIES (COAGULASE NEGATIVE) 11/21/2008     Lab Results  Component Value Date   ALBUMIN 4.2 07/04/2021   ALBUMIN 4.0 12/08/2020   ALBUMIN 4.1 08/03/2020  Lab Results  Component Value Date   MG 2.1 04/12/2016   MG 1.8 03/31/2009   MG 1.7 03/29/2009   Lab Results  Component Value Date   VD25OH 40.71 07/04/2021   VD25OH 25.83 (L) 12/08/2020   VD25OH 45.00 06/15/2020    No results found for: PREALBUMIN CBC EXTENDED Latest Ref Rng & Units 03/01/2021 03/01/2021 12/08/2020  WBC 4.0 - 10.5 K/uL 7.9 8.6 8.3  RBC 4.22 - 5.81 MIL/uL 4.40 4.54 4.61  HGB 13.0 - 17.0 g/dL 14.2 14.5 15.1  HCT 39.0 - 52.0 % 42.8 43.8 44.2  PLT 150 - 400 K/uL 201 215.0 222.0  NEUTROABS 1.4 - 7.7 K/uL - 5.2 5.3  LYMPHSABS 0.7 - 4.0 K/uL - 1.7 1.6     There is no height or weight on file to calculate BMI.  Orders:  Orders Placed This Encounter  Procedures   VAS [place]  ABI WITH/WO TBI   No orders of the defined types were placed in this encounter.    Procedures: No procedures performed  Clinical Data: No additional findings.  ROS:  All other systems negative, except as noted in the HPI. Review of Systems  Objective: Vital Signs: There were no vitals taken for this visit.  Specialty Comments:  No specialty comments available.  PMFS History: Patient Active Problem List   Diagnosis Date Noted   Recurrent falls 08/25/2021   Memory difficulty 07/04/2021   Chronic toe ulcer, left, with fat layer exposed ([place]) 01/23/2021   Pituitary adenoma ([place]) 09/06/2020   Chronic intractable headache 07/27/2020   Vitamin B12 deficiency 03/31/2020   Vitamin D deficiency 03/31/2020   Numbness of foot 03/25/2020   Right hand pain 12/14/2019   Status post amputation of great toe, left ([place]) 12/14/2019   Callus of foot 04/02/2019   Allergy to bee sting 03/11/2019   Insomnia 02/10/2019   Status post lumbar spine operative procedure for decompression of spinal cord 12/12/2018   Dyslipidemia 10/19/2018   Foot deformity 10/19/2018   Spinal stenosis of lumbar region without neurogenic claudication 10/01/2018   Facial rash 05/26/2018   Status post reverse total replacement of right shoulder 05/13/2018   CHF (congestive heart failure) ([place]) 12/27/2017   OSA (obstructive sleep apnea) 12/27/2017   Health maintenance examination 11/25/2017   CKD (chronic kidney disease) stage 3, GFR 30-59 ml/min ([place]) 10/01/2017   Fatty liver 10/01/2017   Status post reverse total replacement of left shoulder 09/03/2017   Bilateral shoulder pain 05/24/2017   History of CVA in adulthood 03/08/2017   S/P total knee arthroplasty, left 01/30/2017   Arthritis of knee 01/15/2017   Skin rash 12/10/2016   Medicare annual wellness visit, subsequent 10/05/2016   Advanced care planning/counseling discussion 10/05/2016   Severe obesity (BMI 35.0-39.9) with comorbidity ([place]) 07/19/2016    Hypothyroidism    Iron deficiency 04/23/2016   Chronic pain syndrome 04/23/2016   Inflammatory arthritis 04/23/2016   Chronic leg pain 04/23/2016   Lesion of palate 04/23/2016   Grieving 04/23/2016   GERD (gastroesophageal reflux disease)    Duodenitis determined by biopsy 02/13/2016   Diverticulosis of colon without hemorrhage 01/31/2016   Chest pain 01/11/2012   Migraine 01/11/2012   History of methicillin resistant staphylococcus aureus (MRSA) 07/04/2010   DDD (degenerative disc disease), lumbar 07/04/2010   History of total right knee replacement 07/04/2010   Primary osteoarthritis, right shoulder 06/09/2007   Personal history of colonic adenoma 06/01/2003   Past Medical History:  Diagnosis Date   Acute kidney failure 08/2008   "  cleared up"  no problems since   Allergy    Anxiety    Arthritis    Asthma    ?of this no inhaler   Blood transfusion    CHF (congestive heart failure) ([place])    ?of this, pt denies   CLOSTRIDIUM DIFFICILE COLITIS 07/04/2010   Annotation: 12/09, 2/10 Qualifier: Diagnosis of  By: [name], [name]     Depression with anxiety    Duodenitis determined by biopsy 02/2016   peptic likely due to aleve (erosive gastropathy with duodenal erosions)   ECZEMA 07/04/2010   Qualifier: Diagnosis of  By: [name], [name]     Elevated liver enzymes    Full dentures    GERD (gastroesophageal reflux disease)    Heart attack ([place])    08/2008 (likely demand ischemia in the setting of MSSA sepsis/R TKA  infection)10-2008   History of hiatal hernia    History of stomach ulcers    Hypothyroidism    Lower GI bleeding    MALAR AND MAXILLARY BONES CLOSED FRACTURE 07/04/2010   Annotation: ORIF Qualifier: Diagnosis of  By: [name], [name]     Migraine    "definitely"   MRSA (methicillin resistant Staphylococcus aureus)    in leg, had to place steel rod in leg   Personal history of colonic adenoma 06/01/2003   PFO (patent foramen ovale)    small PFO by 08/2008 TEE    Pneumonia 08/2008   "while in ICU"   Seizures ([place]) 2009   "long time ago"     Sleep apnea    does not wear CPAP   Stroke ([place]) 2010   unable to complete sentences at times   Wears glasses     Family History  Problem Relation Age of Onset   Coronary artery disease Father 86   Stroke Father    CAD Mother 56       stents   Breast cancer Sister    Diabetes Brother    Diabetes Sister    Colon cancer Neg Hx    Stomach cancer Neg Hx     Past Surgical History:  Procedure Laterality Date   anterior  nerve transposition  07/2009   left ulnar nerve   antibiotic spacer exchange  11/2008; 08/2006   right knee   ARTHROTOMY  08/2008   right knee w/I&D   CARDIOVASCULAR STRESS TEST  2013   stress EKG - negative for ischemia, 4 min 7.3 METs, normal blood pressure response   CATARACT EXTRACTION     COLONOSCOPY  11/2012   diverticulosis, rpt 10 yrs [name])   ESOPHAGOGASTRODUODENOSCOPY  02/2016   erosive gastropathy with duodenal erosions [name])   HARDWARE REMOVAL  04/2006   right knee w/antibiotic spacers placed   [place]  early 1990's   bilateral   JOINT REPLACEMENT Bilateral    KNEE ARTHROSCOPY  10/2001   right   KNEE FUSION  03/2009   right knee removal; antibiotic spacers;    LEFT HEART CATH AND CORONARY ANGIOGRAPHY N/A 02/04/2018    WNL (Patwardhan, Reynold Bowen, MD)   LUMBAR LAMINECTOMY/DECOMPRESSION MICRODISCECTOMY N/A 12/05/2018   Procedure: LUMBAR THREE TO FOUR AND LUMBAR FOUR TO FIVE DECOMPRESSION;  Surgeon: [name], MD;  Location: No Name;  Service: Orthopedics;  Laterality: N/A;   MULTIPLE TOOTH EXTRACTIONS     REPLACEMENT TOTAL KNEE  08/2008; 09/2001   right   REVERSE SHOULDER ARTHROPLASTY Left 09/03/2017   Procedure: REVERSE SHOULDER ARTHROPLASTY;  Surgeon: Marcene Duos  Nicki Reaper, MD;  Location: [facility];  Service: Orthopedics;  Laterality: Left;   REVERSE SHOULDER ARTHROPLASTY Right 05/13/2018   Procedure: RIGHT REVERSE SHOULDER ARTHROPLASTY;  Surgeon: Meredith Pel, MD;  Location: [facility];  Service: Orthopedics;  Laterality: Right;   rod placement Right 03/2009   R knee   SYNOVECTOMY  06/2005   debridement, liner exchange right knee   TOE AMPUTATION Left 08/2008   great toe - osteomyelitis (staph infection)   TOE AMPUTATION Left 04/2016   2nd toe [name]   TONSILLECTOMY     TOTAL KNEE ARTHROPLASTY Left 01/15/2017   TOTAL KNEE ARTHROPLASTY Left 01/15/2017   Procedure: TOTAL KNEE ARTHROPLASTY;  Surgeon: Meredith Pel, MD;  Location: [name];  Service: Orthopedics;  Laterality: Left;   Social History   Occupational History   Occupation: retired  Tobacco Use   Smoking status: Never   Smokeless tobacco: Never  Vaping Use   Vaping Use: Never used  Substance and Sexual Activity   Alcohol use: No    Alcohol/week: 0.0 standard drinks    Comment: "I abused alcohol; last drink  ~ 2005"   Drug use: No   Sexual activity: Never

## 2021-10-06 ENCOUNTER — Encounter (HOSPITAL_COMMUNITY): Payer: Medicare Other

## 2021-10-11 ENCOUNTER — Other Ambulatory Visit: Payer: Self-pay

## 2021-10-11 ENCOUNTER — Ambulatory Visit (HOSPITAL_COMMUNITY)
Admission: RE | Admit: 2021-10-11 | Discharge: 2021-10-11 | Disposition: A | Discharge status: Home / Self Care | Payer: Medicare Other | Source: Ambulatory Visit | Attending: Orthopedic Surgery | Admitting: Orthopedic Surgery

## 2021-10-11 DIAGNOSIS — L97529 Non-pressure chronic ulcer of other part of left foot with unspecified severity: Secondary | ICD-10-CM | POA: Insufficient documentation

## 2021-10-19 ENCOUNTER — Other Ambulatory Visit: Payer: Self-pay

## 2021-10-19 ENCOUNTER — Ambulatory Visit (INDEPENDENT_AMBULATORY_CARE_PROVIDER_SITE_OTHER): Payer: Medicare Other

## 2021-10-19 DIAGNOSIS — E538 Deficiency of other specified B group vitamins: Secondary | ICD-10-CM

## 2021-10-19 MED ORDER — CYANOCOBALAMIN 1000 MCG/ML IJ SOLN
1000.0000 ug | Freq: Once | INTRAMUSCULAR | Status: AC
  Administered 2021-10-19: 1000 ug via INTRAMUSCULAR

## 2021-10-19 NOTE — Progress Notes (Signed)
Per orders of Dr. Damita Dunnings, in Dr. Danise Mina' absence, monthly injection of B12 given by Loreen Freud. Patient tolerated injection well.

## 2021-10-25 ENCOUNTER — Telehealth: Payer: Self-pay

## 2021-10-25 NOTE — Chronic Care Management (AMB) (Addendum)
Chronic Care Management Pharmacy Assistant   Name: Nicholas [name]  MRN: [id number] DOB: 07-16-1951  Reason for Encounter: Medication Adherence and Delivery Coordination   Recent office visits:  None since last CCM contact  Recent consult visits:  10/02/21 - Orthopedic Surgery - Patient presented for left foot pain. Felt pad placed under toe. Order vascular studies. Follow up 4 weeks.  Hospital visits:  None in previous 6 months  Medications: Outpatient Encounter Medications as of 10/25/2021  Medication Sig   acetaminophen (TYLENOL) 500 MG tablet Take 1,000 mg by mouth 2 (two) times daily. IN THE AFTERNOON & AT NIGHT   amitriptyline (ELAVIL) 50 MG tablet TAKE 1-2 TABLETS BY MOUTH AT BEDTIME.   atorvastatin (LIPITOR) 20 MG tablet Take 1 tablet (20 mg total) by mouth daily.   Cholecalciferol (VITAMIN D3) 25 MCG (1000 UT) CAPS Take 1 capsule (1,000 Units total) by mouth daily.   clotrimazole (LOTRIMIN) 1 % cream Apply 1 application topically 2 (two) times daily. To groin (Patient taking differently: Apply 1 application topically 3 (three) times daily. To groin)   EPINEPHrine 0.3 mg/0.3 mL IJ SOAJ injection Inject 0.3 mLs (0.3 mg total) into the muscle as needed for anaphylaxis. (bee stings)   ferrous sulfate 325 (65 FE) MG EC tablet Take 325 mg by mouth every other day.   furosemide (LASIX) 20 MG tablet TAKE 1 TABLET BY MOUTH EVERY DAY AS NEEDED FOR FLUID OR EDEMA.   gabapentin (NEURONTIN) 300 MG capsule TAKE ONE CAPSULE BY MOUTH EVERY MORNING and TAKE TWO CAPSULES AT NOON and TAKE TWO CAPSULES EVERY EVENING   ketoconazole (NIZORAL) 2 % shampoo Apply 1 application topically 3 (three) times a week.   levothyroxine (SYNTHROID) 100 MCG tablet Take 1 tablet (100 mcg total) by mouth daily before breakfast.   metoprolol succinate (TOPROL-XL) 100 MG 24 hr tablet Take 1 tablet (100 mg total) by mouth daily. Take with or immediately following a meal.   nitroGLYCERIN (NITROSTAT) 0.4 MG SL tablet  Place 1 tablet (0.4 mg total) under the tongue every 5 (five) minutes as needed for chest pain.   ondansetron (ZOFRAN ODT) 4 MG disintegrating tablet Take 1 tablet (4 mg total) by mouth every 8 (eight) hours as needed.   pantoprazole (PROTONIX) 40 MG tablet TAKE 1 TABLET BY MOUTH EVERY DAY   tiZANidine (ZANAFLEX) 4 MG tablet Take 1 tablet (4 mg total) by mouth every 6 (six) hours as needed for muscle spasms.   triamcinolone (KENALOG) 0.1 % Apply 1 application topically 2 (two) times daily. Apply to AA.   Ubrogepant (UBRELVY) 50 MG TABS Take 50 mg by mouth daily as needed.   No facility-administered encounter medications on file as of 10/25/2021.   BP Readings from Last 3 Encounters:  07/04/21 120/70  03/01/21 121/84  03/01/21 120/72    Lab Results  Component Value Date   HGBA1C 5.6 07/13/2010      Recent OV, Consult or Hospital visit:  Orthopedic surgery  No medication changes indicated   Last adherence delivery date:10/05/21      Patient is due for next adherence delivery on: 11/06/21  Spoke with patient on 10/26/21 reviewed medications and coordinated delivery.  This delivery to include: Adherence Packaging  30 Days  Packs: Gabapentin 300mg  - take 1 tablet breakfast, 2 tablets lunch,2 tablets evening meal Furosemide 20mg  - take 1 tablet daily breakfast  daily Pantoprazole 40mg  - take 1 tablet at breakfast Atorvastatin 20mg  - take 1 tablet at breakfast Vitamin D3  4mcg - take 1 capsule breakfast Ferrous sulfate 324 mg - take 1 tablet at breakfast  daily  Metoprolol succinate 100mg  - take 1 tablet at bedtime  Levothyroxine 129mcg - take 1 tablet at breakfast Amitriptyline 50mg  - take 2 tablets at bedtime  VIAL medications: none   Patient declined the following medications this month: Roselyn Meier- use as needed   Any concerns about your medications? No  How often do you forget or accidentally miss a dose? Never  Is patient in packaging Yes  What is the date on your next  pill pack? Patient not near the box   Any concerns or issues with your packaging? Patient happy with the packaging process   No refill request needed.  Confirmed delivery date of 11/06/21, advised patient that pharmacy will contact them the morning of delivery.  Recent blood pressure readings are as follows:  125/78 mmHg  (home reading)   Annual wellness visit in last year? Yes Most recent clinic BP reading:  120/70, [place], CPP notified  [name], [place] Assistant [phone]  I have reviewed the care management and care coordination activities outlined in this encounter and I am certifying that I agree with the content of this note. No further action required.  [name], PharmD Clinical Pharmacist [facility] at [facility] [phone]

## 2021-11-02 ENCOUNTER — Ambulatory Visit (INDEPENDENT_AMBULATORY_CARE_PROVIDER_SITE_OTHER): Payer: Medicare Other | Admitting: Orthopedic Surgery

## 2021-11-02 ENCOUNTER — Encounter: Payer: Self-pay | Admitting: Orthopedic Surgery

## 2021-11-02 ENCOUNTER — Other Ambulatory Visit: Payer: Self-pay

## 2021-11-02 DIAGNOSIS — L97529 Non-pressure chronic ulcer of other part of left foot with unspecified severity: Secondary | ICD-10-CM

## 2021-11-02 NOTE — Progress Notes (Addendum)
Office Visit Note   Patient: Nicholas [name]           Date of Birth: 29-Oct-1950           MRN: [id number] Visit Date: 11/02/2021              Requested by: [name], MD [address] [place],  [place] [address] PCP: [name], MD  Chief Complaint  Patient presents with   Left Foot - Follow-up      HPI: Patient is a 71 year old gentleman who presents in follow-up for Wagner grade 1 ulcer beneath the first metatarsal head left foot he is status post great toe and second toe amputation.  Patient has had ankle-brachial indices obtained about 3 weeks ago.  Assessment & Plan: Visit Diagnoses:  1. Chronic ulcer of toe of left foot, unspecified ulcer stage ([place])     Plan: Continue with the custom orthotics and the felt relieving donut.  Discussed that he could get a pair of new balance walking sneakers.  Follow-Up Instructions: Return in about 4 weeks (around 11/30/2021).   Ortho Exam  Patient is alert, oriented, no adenopathy, well-dressed, normal affect, normal respiratory effort. Examination patient has a Wagner grade 1 ulcer beneath the first metatarsal head.  After informed consent a 10 blade knife was used to debride skin and soft tissue back to healthy viable tissue.  After debridement the ulcer is 2 cm in diameter and 2 mm deep.  Prior to debridement the ulcer was 1 cm in diameter.  There is no exposed bone or tendon healthy tissue no cellulitis no drainage.   Review of the ankle-brachial indices shows good circulation to both lower extremities with triphasic flow.  Imaging: No results found. No images are attached to the encounter.  Labs: Lab Results  Component Value Date   HGBA1C 5.6 07/13/2010   HGBA1C  12/08/2008    5.2 (NOTE)   The ADA recommends the following therapeutic goal for glycemic   control related to Hgb A1C measurement:   Goal of Therapy:   < 7.0% Hgb A1C   Reference: American Diabetes Association: Clinical Practice    Recommendations 2008, Diabetes Care,  2008, 31:(Suppl 1).   HGBA1C  11/23/2008    5.1 (NOTE)   The ADA recommends the following therapeutic goal for glycemic   control related to Hgb A1C measurement:   Goal of Therapy:   < 7.0% Hgb A1C   Reference: American Diabetes Association: Clinical Practice   Recommendations 2008, Diabetes Care,  2008, 31:(Suppl 1).   ESRSEDRATE 28 (H) 03/01/2021   ESRSEDRATE 18 08/03/2020   ESRSEDRATE 22 (H) 01/11/2012   LABURIC 8.8 (H) 03/01/2021   LABURIC 5.0 02/08/2016   REPTSTATUS 05/07/2018 FINAL 05/05/2018   GRAMSTAIN  03/29/2009    NO WBC SEEN NO SQUAMOUS EPITHELIAL CELLS SEEN NO ORGANISMS SEEN   GRAMSTAIN  03/29/2009    NO WBC SEEN NO SQUAMOUS EPITHELIAL CELLS SEEN NO ORGANISMS SEEN   CULT (A) 05/05/2018    <10,000 COLONIES/mL INSIGNIFICANT GROWTH Performed at [facility], [place] [address], [place], [place] [address]    LABORGA STAPHYLOCOCCUS SPECIES (COAGULASE NEGATIVE) 11/21/2008     Lab Results  Component Value Date   ALBUMIN 4.2 07/04/2021   ALBUMIN 4.0 12/08/2020   ALBUMIN 4.1 08/03/2020    Lab Results  Component Value Date   MG 2.1 04/12/2016   MG 1.8 03/31/2009   MG 1.7 03/29/2009   Lab Results  Component Value Date  VD25OH 40.71 07/04/2021   VD25OH 25.83 (L) 12/08/2020   VD25OH 45.00 06/15/2020    No results found for: PREALBUMIN CBC EXTENDED Latest Ref Rng & Units 03/01/2021 03/01/2021 12/08/2020  WBC 4.0 - 10.5 K/uL 7.9 8.6 8.3  RBC 4.22 - 5.81 MIL/uL 4.40 4.54 4.61  HGB 13.0 - 17.0 g/dL 14.2 14.5 15.1  HCT 39.0 - 52.0 % 42.8 43.8 44.2  PLT 150 - 400 K/uL 201 215.0 222.0  NEUTROABS 1.4 - 7.7 K/uL - 5.2 5.3  LYMPHSABS 0.7 - 4.0 K/uL - 1.7 1.6     There is no height or weight on file to calculate BMI.  Orders:  No orders of the defined types were placed in this encounter.  No orders of the defined types were placed in this encounter.    Procedures: No procedures performed  Clinical Data: No additional  findings.  ROS:  All other systems negative, except as noted in the HPI. Review of Systems  Objective: Vital Signs: There were no vitals taken for this visit.  Specialty Comments:  No specialty comments available.  PMFS History: Patient Active Problem List   Diagnosis Date Noted   Recurrent falls 08/25/2021   Memory difficulty 07/04/2021   Chronic toe ulcer, left, with fat layer exposed ([place]) 01/23/2021   Pituitary adenoma ([place]) 09/06/2020   Chronic intractable headache 07/27/2020   Vitamin B12 deficiency 03/31/2020   Vitamin D deficiency 03/31/2020   Numbness of foot 03/25/2020   Right hand pain 12/14/2019   Status post amputation of great toe, left ([place]) 12/14/2019   Callus of foot 04/02/2019   Allergy to bee sting 03/11/2019   Insomnia 02/10/2019   Status post lumbar spine operative procedure for decompression of spinal cord 12/12/2018   Dyslipidemia 10/19/2018   Foot deformity 10/19/2018   Spinal stenosis of lumbar region without neurogenic claudication 10/01/2018   Facial rash 05/26/2018   Status post reverse total replacement of right shoulder 05/13/2018   CHF (congestive heart failure) ([place]) 12/27/2017   OSA (obstructive sleep apnea) 12/27/2017   Health maintenance examination 11/25/2017   CKD (chronic kidney disease) stage 3, GFR 30-59 ml/min ([place]) 10/01/2017   Fatty liver 10/01/2017   Status post reverse total replacement of left shoulder 09/03/2017   Bilateral shoulder pain 05/24/2017   History of CVA in adulthood 03/08/2017   S/P total knee arthroplasty, left 01/30/2017   Arthritis of knee 01/15/2017   Skin rash 12/10/2016   Medicare annual wellness visit, subsequent 10/05/2016   Advanced care planning/counseling discussion 10/05/2016   Severe obesity (BMI 35.0-39.9) with comorbidity ([place]) 07/19/2016   Hypothyroidism    Iron deficiency 04/23/2016   Chronic pain syndrome 04/23/2016   Inflammatory arthritis 04/23/2016   Chronic leg pain 04/23/2016    Lesion of palate 04/23/2016   Grieving 04/23/2016   GERD (gastroesophageal reflux disease)    Duodenitis determined by biopsy 02/13/2016   Diverticulosis of colon without hemorrhage 01/31/2016   Chest pain 01/11/2012   Migraine 01/11/2012   History of methicillin resistant staphylococcus aureus (MRSA) 07/04/2010   DDD (degenerative disc disease), lumbar 07/04/2010   History of total right knee replacement 07/04/2010   Primary osteoarthritis, right shoulder 06/09/2007   Personal history of colonic adenoma 06/01/2003   Past Medical History:  Diagnosis Date   Acute kidney failure 08/2008   "cleared up"  no problems since   Allergy    Anxiety    Arthritis    Asthma    ?of this no inhaler   Blood transfusion  CHF (congestive heart failure) ([place])    ?of this, pt denies   CLOSTRIDIUM DIFFICILE COLITIS 07/04/2010   Annotation: 12/09, 2/10 Qualifier: Diagnosis of  By: [name], [name]     Depression with anxiety    Duodenitis determined by biopsy 02/2016   peptic likely due to aleve (erosive gastropathy with duodenal erosions)   ECZEMA 07/04/2010   Qualifier: Diagnosis of  By: [name], [name]     Elevated liver enzymes    Full dentures    GERD (gastroesophageal reflux disease)    Heart attack ([place])    08/2008 (likely demand ischemia in the setting of MSSA sepsis/R TKA  infection)10-2008   History of hiatal hernia    History of stomach ulcers    Hypothyroidism    Lower GI bleeding    MALAR AND MAXILLARY BONES CLOSED FRACTURE 07/04/2010   Annotation: ORIF Qualifier: Diagnosis of  By: [name], [name]     Migraine    "definitely"   MRSA (methicillin resistant Staphylococcus aureus)    in leg, had to place steel rod in leg   Personal history of colonic adenoma 06/01/2003   PFO (patent foramen ovale)    small PFO by 08/2008 TEE   Pneumonia 08/2008   "while in ICU"   Seizures ([place]) 2009   "long time ago"     Sleep apnea    does not wear CPAP   Stroke ([place]) 2010   unable to  complete sentences at times   Wears glasses     Family History  Problem Relation Age of Onset   Coronary artery disease Father 42   Stroke Father    CAD Mother 75       stents   Breast cancer Sister    Diabetes Brother    Diabetes Sister    Colon cancer Neg Hx    Stomach cancer Neg Hx     Past Surgical History:  Procedure Laterality Date   anterior  nerve transposition  07/2009   left ulnar nerve   antibiotic spacer exchange  11/2008; 08/2006   right knee   ARTHROTOMY  08/2008   right knee w/I&D   CARDIOVASCULAR STRESS TEST  2013   stress EKG - negative for ischemia, 4 min 7.3 METs, normal blood pressure response   CATARACT EXTRACTION     COLONOSCOPY  11/2012   diverticulosis, rpt 10 yrs [name])   ESOPHAGOGASTRODUODENOSCOPY  02/2016   erosive gastropathy with duodenal erosions [name])   HARDWARE REMOVAL  04/2006   right knee w/antibiotic spacers placed   [place]  early 1990's   bilateral   JOINT REPLACEMENT Bilateral    KNEE ARTHROSCOPY  10/2001   right   KNEE FUSION  03/2009   right knee removal; antibiotic spacers;    LEFT HEART CATH AND CORONARY ANGIOGRAPHY N/A 02/04/2018    WNL (Patwardhan, Reynold Bowen, MD)   LUMBAR LAMINECTOMY/DECOMPRESSION MICRODISCECTOMY N/A 12/05/2018   Procedure: LUMBAR THREE TO FOUR AND LUMBAR FOUR TO FIVE DECOMPRESSION;  Surgeon: [name], MD;  Location: [facility];  Service: Orthopedics;  Laterality: N/A;   MULTIPLE TOOTH EXTRACTIONS     REPLACEMENT TOTAL KNEE  08/2008; 09/2001   right   REVERSE SHOULDER ARTHROPLASTY Left 09/03/2017   Procedure: REVERSE SHOULDER ARTHROPLASTY;  Surgeon: Meredith Pel, MD;  Location: [facility];  Service: Orthopedics;  Laterality: Left;   REVERSE SHOULDER ARTHROPLASTY Right 05/13/2018   Procedure: RIGHT REVERSE SHOULDER ARTHROPLASTY;  Surgeon: Meredith Pel, MD;  Location: [facility]  OR;  Service: Orthopedics;  Laterality: Right;   rod placement Right 03/2009   R knee   SYNOVECTOMY  06/2005    debridement, liner exchange right knee   TOE AMPUTATION Left 08/2008   great toe - osteomyelitis (staph infection)   TOE AMPUTATION Left 04/2016   2nd toe [name]   TONSILLECTOMY     TOTAL KNEE ARTHROPLASTY Left 01/15/2017   TOTAL KNEE ARTHROPLASTY Left 01/15/2017   Procedure: TOTAL KNEE ARTHROPLASTY;  Surgeon: Meredith Pel, MD;  Location: [facility];  Service: Orthopedics;  Laterality: Left;   Social History   Occupational History   Occupation: retired  Tobacco Use   Smoking status: Never   Smokeless tobacco: Never  Vaping Use   Vaping Use: Never used  Substance and Sexual Activity   Alcohol use: No    Alcohol/week: 0.0 standard drinks    Comment: "I abused alcohol; last drink  ~ 2005"   Drug use: No   Sexual activity: Never

## 2021-11-21 ENCOUNTER — Ambulatory Visit: Payer: Medicare Other

## 2021-11-22 ENCOUNTER — Other Ambulatory Visit: Payer: Self-pay | Admitting: Family Medicine

## 2021-11-23 ENCOUNTER — Telehealth: Payer: Self-pay

## 2021-11-23 DIAGNOSIS — R079 Chest pain, unspecified: Secondary | ICD-10-CM

## 2021-11-23 NOTE — Chronic Care Management (AMB) (Addendum)
Chronic Care Management Pharmacy Assistant   Name: ZAUL HUBERS  MRN: [id number] DOB: 01/31/1951  Reason for Encounter: Medication Adherence and Delivery Coordination    Recent office visits:  None since last CCM contact  Recent consult visits:  11/02/21-Orthopedic Surgery- Patient presented for follow up left toe ulcer.Continue custom orthotics and discussed getting new balance walking sneakers.  Hospital visits:  None in previous 6 months  Medications: Outpatient Encounter Medications as of 11/23/2021  Medication Sig   acetaminophen (TYLENOL) 500 MG tablet Take 1,000 mg by mouth 2 (two) times daily. IN THE AFTERNOON & AT NIGHT   amitriptyline (ELAVIL) 50 MG tablet TAKE 1-2 TABLETS BY MOUTH AT BEDTIME.   atorvastatin (LIPITOR) 20 MG tablet Take 1 tablet (20 mg total) by mouth daily.   Cholecalciferol (VITAMIN D3) 25 MCG (1000 UT) CAPS Take 1 capsule (1,000 Units total) by mouth daily.   clotrimazole (LOTRIMIN) 1 % cream Apply 1 application topically 2 (two) times daily. To groin (Patient taking differently: Apply 1 application topically 3 (three) times daily. To groin)   EPINEPHrine 0.3 mg/0.3 mL IJ SOAJ injection Inject 0.3 mLs (0.3 mg total) into the muscle as needed for anaphylaxis. (bee stings)   ferrous sulfate 325 (65 FE) MG EC tablet Take 325 mg by mouth every other day.   furosemide (LASIX) 20 MG tablet TAKE ONE TABLET BY MOUTH EVERY MORNING   gabapentin (NEURONTIN) 300 MG capsule TAKE ONE CAPSULE BY MOUTH EVERY MORNING and TAKE TWO CAPSULES AT NOON and TAKE TWO CAPSULES EVERY EVENING   ketoconazole (NIZORAL) 2 % shampoo Apply 1 application topically 3 (three) times a week.   levothyroxine (SYNTHROID) 100 MCG tablet Take 1 tablet (100 mcg total) by mouth daily before breakfast.   metoprolol succinate (TOPROL-XL) 100 MG 24 hr tablet Take 1 tablet (100 mg total) by mouth daily. Take with or immediately following a meal.   nitroGLYCERIN (NITROSTAT) 0.4 MG SL tablet Place 1  tablet (0.4 mg total) under the tongue every 5 (five) minutes as needed for chest pain.   ondansetron (ZOFRAN ODT) 4 MG disintegrating tablet Take 1 tablet (4 mg total) by mouth every 8 (eight) hours as needed.   pantoprazole (PROTONIX) 40 MG tablet TAKE 1 TABLET BY MOUTH EVERY DAY   tiZANidine (ZANAFLEX) 4 MG tablet Take 1 tablet (4 mg total) by mouth every 6 (six) hours as needed for muscle spasms.   triamcinolone (KENALOG) 0.1 % Apply 1 application topically 2 (two) times daily. Apply to AA.   Ubrogepant (UBRELVY) 50 MG TABS Take 50 mg by mouth daily as needed.   No facility-administered encounter medications on file as of 11/23/2021.   BP Readings from Last 3 Encounters:  07/04/21 120/70  03/01/21 121/84  03/01/21 120/72    Lab Results  Component Value Date   HGBA1C 5.6 07/13/2010      Recent OV, Consult or Hospital visit:  11/02/21-Orthopedic Surgery-left toe ulcer No medication changes indicated   Last adherence delivery date:11/06/21      Patient is due for next adherence delivery on: 12/05/21  Spoke with patient on 11/27/21 reviewed medications and coordinated delivery.  This delivery to include: Adherence Packaging  30 Days  Packs: Gabapentin 300mg  - take 1 tablet breakfast, 2 tablets lunch,2 tablets evening meal Furosemide 20mg  - take 1 tablet daily breakfast  daily Pantoprazole 40mg  - take 1 tablet at breakfast Atorvastatin 20mg  - take 1 tablet at breakfast Vitamin D3 24mcg - take 1 capsule breakfast Ferrous sulfate  324 mg - take 1 tablet at breakfast  daily  Metoprolol succinate 100mg  - take 1 tablet at bedtime  Levothyroxine 125mcg - take 1 tablet at breakfast Amitriptyline 50mg  - take 2 tablets at bedtime  VIAL medications: NitroStat 0.4mg  sublingual- use when needed (patient's supply has expired)  Patient declined the following medications this month: Roselyn Meier- use as needed  Any concerns about your medications? No  How often do you forget or accidentally  miss a dose? Never  Is patient in packaging Yes  What is the date on your next pill pack? 12/10/21  Any concerns or issues with your packaging? No concerns at this time   Refills requested from providers include: Furosemide   Confirmed delivery date of 12/05/21, advised patient that pharmacy will contact them the morning of delivery.  Recent blood pressure readings are as follows:  Patient states home readings averaging in the 123/74 range  Annual wellness visit in last year? Yes Most Recent BP reading: 120/70  55-P 07/04/21  [name], CPP notified  [name], [employer] Assistant [phone]  I have reviewed the care management and care coordination activities outlined in this encounter and I am certifying that I agree with the content of this note. No further action required.  [name], PharmD Clinical Pharmacist [facility] [phone]

## 2021-11-27 MED ORDER — NITROGLYCERIN 0.4 MG SL SUBL: 0.4000 mg | SUBLINGUAL_TABLET | SUBLINGUAL | 3 refills | Status: DC | PRN

## 2021-11-27 NOTE — Addendum Note (Signed)
Addended by: [name] on: 11/27/2021 01:48 PM   Modules accepted: Orders

## 2021-11-30 ENCOUNTER — Other Ambulatory Visit: Payer: Self-pay

## 2021-11-30 ENCOUNTER — Ambulatory Visit (INDEPENDENT_AMBULATORY_CARE_PROVIDER_SITE_OTHER): Payer: Medicare Other | Admitting: Orthopedic Surgery

## 2021-11-30 DIAGNOSIS — L97529 Non-pressure chronic ulcer of other part of left foot with unspecified severity: Secondary | ICD-10-CM | POA: Diagnosis not present

## 2021-12-12 ENCOUNTER — Encounter: Payer: Self-pay | Admitting: Orthopedic Surgery

## 2021-12-12 NOTE — Progress Notes (Signed)
Office Visit Note   Patient: Nicholas [name]           Date of Birth: 03/14/1951           MRN: [id number] Visit Date: 11/30/2021              Requested by: [name], MD [address] [place],  [place] [address] PCP: [name], MD  Chief Complaint  Patient presents with   Left Foot - Follow-up      HPI: Patient is a 71 year old gentleman who presents in follow-up for chronic left foot Wagner grade 1 ulcer beneath the left first metatarsal head.  Patient has been using orthotics with a felt relieving donut..  Patient is status post first and second toe amputations.  Assessment & Plan: Visit Diagnoses:  1. Chronic ulcer of toe of left foot, unspecified ulcer stage ([place])     Plan: Continue with conservative treatment discussed the possibility of bony resection of the base of the proximal phalanx left great toe.  Follow-Up Instructions: Return in about 4 weeks (around 12/28/2021).   Ortho Exam  Patient is alert, oriented, no adenopathy, well-dressed, normal affect, normal respiratory effort. Examination patient has a Wagner grade 1 ulcer beneath the residual proximal phalanx of the left great toe.  The ulcer does not extend down to bone capsule or tendon.  After informed consent a 10 blade knife was used to debride the skin and soft tissue back to healthy viable granulation tissue silver nitrate was used for hemostasis.  The ulcer was 1 cm diameter prior to debridement 2 cm in diameter after debridement.  Silver nitrate was used for hemostasis.  Patient has a palpable pulse.  Imaging: No results found. No images are attached to the encounter.  Labs: Lab Results  Component Value Date   HGBA1C 5.6 07/13/2010   HGBA1C  12/08/2008    5.2 (NOTE)   The ADA recommends the following therapeutic goal for glycemic   control related to Hgb A1C measurement:   Goal of Therapy:   < 7.0% Hgb A1C   Reference: American Diabetes Association: Clinical Practice    Recommendations 2008, Diabetes Care,  2008, 31:(Suppl 1).   HGBA1C  11/23/2008    5.1 (NOTE)   The ADA recommends the following therapeutic goal for glycemic   control related to Hgb A1C measurement:   Goal of Therapy:   < 7.0% Hgb A1C   Reference: American Diabetes Association: Clinical Practice   Recommendations 2008, Diabetes Care,  2008, 31:(Suppl 1).   ESRSEDRATE 28 (H) 03/01/2021   ESRSEDRATE 18 08/03/2020   ESRSEDRATE 22 (H) 01/11/2012   LABURIC 8.8 (H) 03/01/2021   LABURIC 5.0 02/08/2016   REPTSTATUS 05/07/2018 FINAL 05/05/2018   GRAMSTAIN  03/29/2009    NO WBC SEEN NO SQUAMOUS EPITHELIAL CELLS SEEN NO ORGANISMS SEEN   GRAMSTAIN  03/29/2009    NO WBC SEEN NO SQUAMOUS EPITHELIAL CELLS SEEN NO ORGANISMS SEEN   CULT (A) 05/05/2018    <10,000 COLONIES/mL INSIGNIFICANT GROWTH Performed at [facility], [place] [address] [place], [place] [address]    LABORGA STAPHYLOCOCCUS SPECIES (COAGULASE NEGATIVE) 11/21/2008     Lab Results  Component Value Date   ALBUMIN 4.2 07/04/2021   ALBUMIN 4.0 12/08/2020   ALBUMIN 4.1 08/03/2020    Lab Results  Component Value Date   MG 2.1 04/12/2016   MG 1.8 03/31/2009   MG 1.7 03/29/2009   Lab Results  Component Value Date   VD25OH  40.71 07/04/2021   VD25OH 25.83 (L) 12/08/2020   VD25OH 45.00 06/15/2020    No results found for: PREALBUMIN CBC EXTENDED Latest Ref Rng & Units 03/01/2021 03/01/2021 12/08/2020  WBC 4.0 - 10.5 K/uL 7.9 8.6 8.3  RBC 4.22 - 5.81 MIL/uL 4.40 4.54 4.61  HGB 13.0 - 17.0 g/dL 14.2 14.5 15.1  HCT 39.0 - 52.0 % 42.8 43.8 44.2  PLT 150 - 400 K/uL 201 215.0 222.0  NEUTROABS 1.4 - 7.7 K/uL - 5.2 5.3  LYMPHSABS 0.7 - 4.0 K/uL - 1.7 1.6     There is no height or weight on file to calculate BMI.  Orders:  No orders of the defined types were placed in this encounter.  No orders of the defined types were placed in this encounter.    Procedures: No procedures performed  Clinical Data: No additional  findings.  ROS:  All other systems negative, except as noted in the HPI. Review of Systems  Objective: Vital Signs: There were no vitals taken for this visit.  Specialty Comments:  No specialty comments available.  PMFS History: Patient Active Problem List   Diagnosis Date Noted   Recurrent falls 08/25/2021   Memory difficulty 07/04/2021   Chronic toe ulcer, left, with fat layer exposed ([place]) 01/23/2021   Pituitary adenoma ([place]) 09/06/2020   Chronic intractable headache 07/27/2020   Vitamin B12 deficiency 03/31/2020   Vitamin D deficiency 03/31/2020   Numbness of foot 03/25/2020   Right hand pain 12/14/2019   Status post amputation of great toe, left ([place]) 12/14/2019   Callus of foot 04/02/2019   Allergy to bee sting 03/11/2019   Insomnia 02/10/2019   Status post lumbar spine operative procedure for decompression of spinal cord 12/12/2018   Dyslipidemia 10/19/2018   Foot deformity 10/19/2018   Spinal stenosis of lumbar region without neurogenic claudication 10/01/2018   Facial rash 05/26/2018   Status post reverse total replacement of right shoulder 05/13/2018   CHF (congestive heart failure) ([place]) 12/27/2017   OSA (obstructive sleep apnea) 12/27/2017   Health maintenance examination 11/25/2017   CKD (chronic kidney disease) stage 3, GFR 30-59 ml/min ([place]) 10/01/2017   Fatty liver 10/01/2017   Status post reverse total replacement of left shoulder 09/03/2017   Bilateral shoulder pain 05/24/2017   History of CVA in adulthood 03/08/2017   S/P total knee arthroplasty, left 01/30/2017   Arthritis of knee 01/15/2017   Skin rash 12/10/2016   Medicare annual wellness visit, subsequent 10/05/2016   Advanced care planning/counseling discussion 10/05/2016   Severe obesity (BMI 35.0-39.9) with comorbidity ([place]) 07/19/2016   Hypothyroidism    Iron deficiency 04/23/2016   Chronic pain syndrome 04/23/2016   Inflammatory arthritis 04/23/2016   Chronic leg pain 04/23/2016    Lesion of palate 04/23/2016   Grieving 04/23/2016   GERD (gastroesophageal reflux disease)    Duodenitis determined by biopsy 02/13/2016   Diverticulosis of colon without hemorrhage 01/31/2016   Chest pain 01/11/2012   Migraine 01/11/2012   History of methicillin resistant staphylococcus aureus (MRSA) 07/04/2010   DDD (degenerative disc disease), lumbar 07/04/2010   History of total right knee replacement 07/04/2010   Primary osteoarthritis, right shoulder 06/09/2007   Personal history of colonic adenoma 06/01/2003   Past Medical History:  Diagnosis Date   Acute kidney failure 08/2008   "cleared up"  no problems since   Allergy    Anxiety    Arthritis    Asthma    ?of this no inhaler   Blood transfusion  CHF (congestive heart failure) ([place])    ?of this, pt denies   CLOSTRIDIUM DIFFICILE COLITIS 07/04/2010   Annotation: 12/09, 2/10 Qualifier: Diagnosis of  By: [name], [name]     Depression with anxiety    Duodenitis determined by biopsy 02/2016   peptic likely due to aleve (erosive gastropathy with duodenal erosions)   ECZEMA 07/04/2010   Qualifier: Diagnosis of  By: [name], [name]     Elevated liver enzymes    Full dentures    GERD (gastroesophageal reflux disease)    Heart attack ([place])    08/2008 (likely demand ischemia in the setting of MSSA sepsis/R TKA  infection)10-2008   History of hiatal hernia    History of stomach ulcers    Hypothyroidism    Lower GI bleeding    MALAR AND MAXILLARY BONES CLOSED FRACTURE 07/04/2010   Annotation: ORIF Qualifier: Diagnosis of  By: [name], [name]     Migraine    "definitely"   MRSA (methicillin resistant Staphylococcus aureus)    in leg, had to place steel rod in leg   Personal history of colonic adenoma 06/01/2003   PFO (patent foramen ovale)    small PFO by 08/2008 TEE   Pneumonia 08/2008   "while in ICU"   Seizures ([place]) 2009   "long time ago"     Sleep apnea    does not wear CPAP   Stroke ([place]) 2010   unable to  complete sentences at times   Wears glasses     Family History  Problem Relation Age of Onset   Coronary artery disease Father 7   Stroke Father    CAD Mother 13       stents   Breast cancer Sister    Diabetes Brother    Diabetes Sister    Colon cancer Neg Hx    Stomach cancer Neg Hx     Past Surgical History:  Procedure Laterality Date   anterior  nerve transposition  07/2009   left ulnar nerve   antibiotic spacer exchange  11/2008; 08/2006   right knee   ARTHROTOMY  08/2008   right knee w/I&D   CARDIOVASCULAR STRESS TEST  2013   stress EKG - negative for ischemia, 4 min 7.3 METs, normal blood pressure response   CATARACT EXTRACTION     COLONOSCOPY  11/2012   diverticulosis, rpt 10 yrs [name])   ESOPHAGOGASTRODUODENOSCOPY  02/2016   erosive gastropathy with duodenal erosions [name])   HARDWARE REMOVAL  04/2006   right knee w/antibiotic spacers placed   [place]  early 1990's   bilateral   JOINT REPLACEMENT Bilateral    KNEE ARTHROSCOPY  10/2001   right   KNEE FUSION  03/2009   right knee removal; antibiotic spacers;    LEFT HEART CATH AND CORONARY ANGIOGRAPHY N/A 02/04/2018    WNL (Patwardhan, Reynold Bowen, MD)   LUMBAR LAMINECTOMY/DECOMPRESSION MICRODISCECTOMY N/A 12/05/2018   Procedure: LUMBAR THREE TO FOUR AND LUMBAR FOUR TO FIVE DECOMPRESSION;  Surgeon: [name], MD;  Location: [facility];  Service: Orthopedics;  Laterality: N/A;   MULTIPLE TOOTH EXTRACTIONS     REPLACEMENT TOTAL KNEE  08/2008; 09/2001   right   REVERSE SHOULDER ARTHROPLASTY Left 09/03/2017   Procedure: REVERSE SHOULDER ARTHROPLASTY;  Surgeon: Meredith Pel, MD;  Location: [facility];  Service: Orthopedics;  Laterality: Left;   REVERSE SHOULDER ARTHROPLASTY Right 05/13/2018   Procedure: RIGHT REVERSE SHOULDER ARTHROPLASTY;  Surgeon: Meredith Pel, MD;  Location: [facility]  OR;  Service: Orthopedics;  Laterality: Right;   rod placement Right 03/2009   R knee   SYNOVECTOMY  06/2005    debridement, liner exchange right knee   TOE AMPUTATION Left 08/2008   great toe - osteomyelitis (staph infection)   TOE AMPUTATION Left 04/2016   2nd toe [name]   TONSILLECTOMY     TOTAL KNEE ARTHROPLASTY Left 01/15/2017   TOTAL KNEE ARTHROPLASTY Left 01/15/2017   Procedure: TOTAL KNEE ARTHROPLASTY;  Surgeon: Meredith Pel, MD;  Location: [facility];  Service: Orthopedics;  Laterality: Left;   Social History   Occupational History   Occupation: retired  Tobacco Use   Smoking status: Never   Smokeless tobacco: Never  Vaping Use   Vaping Use: Never used  Substance and Sexual Activity   Alcohol use: No    Alcohol/week: 0.0 standard drinks    Comment: "I abused alcohol; last drink  ~ 2005"   Drug use: No   Sexual activity: Never

## 2021-12-18 ENCOUNTER — Other Ambulatory Visit: Payer: Self-pay

## 2021-12-18 ENCOUNTER — Encounter: Payer: Self-pay | Admitting: Family Medicine

## 2021-12-18 ENCOUNTER — Ambulatory Visit (INDEPENDENT_AMBULATORY_CARE_PROVIDER_SITE_OTHER): Payer: Medicare Other | Admitting: Family Medicine

## 2021-12-18 VITALS — BP 130/74 | HR 74 | Temp 97.5°F | Ht 68.0 in | Wt 266.1 lb

## 2021-12-18 DIAGNOSIS — R413 Other amnesia: Secondary | ICD-10-CM

## 2021-12-18 DIAGNOSIS — M21962 Unspecified acquired deformity of left lower leg: Secondary | ICD-10-CM

## 2021-12-18 DIAGNOSIS — G894 Chronic pain syndrome: Secondary | ICD-10-CM

## 2021-12-18 DIAGNOSIS — L97522 Non-pressure chronic ulcer of other part of left foot with fat layer exposed: Secondary | ICD-10-CM

## 2021-12-18 DIAGNOSIS — M79604 Pain in right leg: Secondary | ICD-10-CM | POA: Diagnosis not present

## 2021-12-18 DIAGNOSIS — R200 Anesthesia of skin: Secondary | ICD-10-CM

## 2021-12-18 DIAGNOSIS — G8929 Other chronic pain: Secondary | ICD-10-CM | POA: Diagnosis not present

## 2021-12-18 DIAGNOSIS — Z89412 Acquired absence of left great toe: Secondary | ICD-10-CM

## 2021-12-18 DIAGNOSIS — E538 Deficiency of other specified B group vitamins: Secondary | ICD-10-CM

## 2021-12-18 MED ORDER — CYANOCOBALAMIN 1000 MCG/ML IJ SOLN
1000.0000 ug | Freq: Once | INTRAMUSCULAR | Status: AC
  Administered 2021-12-18: 1000 ug via INTRAMUSCULAR

## 2021-12-18 MED ORDER — EPINEPHRINE 0.3 MG/0.3ML IJ SOAJ: 0.3000 mg | INTRAMUSCULAR | 1 refills | Status: DC | PRN

## 2021-12-18 NOTE — Patient Instructions (Addendum)
B12 shot today  ?You are doing well today ?Continue current medicines.  ?Incorporate exercise into routine is good - slowly titrate.  ?Return in 2 months for wellness visit/physical.  ?

## 2021-12-18 NOTE — Progress Notes (Signed)
? ? Patient ID: Nicholas [name], male    DOB: 08-10-1951, 71 y.o.   MRN: [id number] ? ?This visit was conducted in person. ? ?BP 130/74   Pulse 74   Temp (!) 97.5 ?F (36.4 ?C) (Temporal)   Ht '5\' 8"'$  (1.727 m)   Wt 266 lb 2 oz (120.7 kg)   SpO2 93%   BMI 40.46 kg/m?   ? ?CC: follow up visit  ?Subjective:  ? ?HPI: ?Nicholas [name] is a 71 y.o. male presenting on 12/18/2021 for Follow-up (Here for f/u.  Pt c/o really dark stool due to vit D3.  Started about 5 yrs ago and has previously discussed with Dr. Darnell Level. ) ? ? ?Notes dark stools attributed to vitamin D3 supplement. Ongoing for years. Normally formed otherwise. Doesn't think this is due to blood.  ?Last colonoscopy WNL 11/2012 [name])  ?Declines iFOB at this time.  ? ?Monthly B12 shots started 06/2021. Hasn't noticed much change in energy levels despite B12 replacement.  ? ?Chronic leg pain - only on gabapentin, tizanidine prn, and amitriptyline nightly.  ?Ortho continues following chronic left toe ulcer.  ?Known PAD R>L.  ?Upcoming appt 3/16.  ?   ? ?Relevant past medical, surgical, family and social history reviewed and updated as indicated. Interim medical history since our last visit reviewed. ?Allergies and medications reviewed and updated. ?Outpatient Medications Prior to Visit  ?Medication Sig Dispense Refill  ? acetaminophen (TYLENOL) 500 MG tablet Take 1,000 mg by mouth 2 (two) times daily. IN THE AFTERNOON & AT NIGHT    ? amitriptyline (ELAVIL) 50 MG tablet TAKE 1-2 TABLETS BY MOUTH AT BEDTIME. 180 tablet 3  ? atorvastatin (LIPITOR) 20 MG tablet Take 1 tablet (20 mg total) by mouth daily. 90 tablet 3  ? Cholecalciferol (VITAMIN D3) 25 MCG (1000 UT) CAPS Take 1 capsule (1,000 Units total) by mouth daily. 90 capsule 1  ? clotrimazole (LOTRIMIN) 1 % cream Apply 1 application topically 2 (two) times daily. To groin (Patient taking differently: Apply 1 application. topically 3 (three) times daily. To groin) 60 g 1  ? ferrous sulfate 325 (65 FE) MG EC tablet Take  325 mg by mouth every other day.    ? furosemide (LASIX) 20 MG tablet TAKE ONE TABLET BY MOUTH EVERY MORNING 90 tablet 1  ? gabapentin (NEURONTIN) 300 MG capsule TAKE ONE CAPSULE BY MOUTH EVERY MORNING and TAKE TWO CAPSULES AT NOON and TAKE TWO CAPSULES EVERY EVENING 450 capsule 1  ? ketoconazole (NIZORAL) 2 % shampoo Apply 1 application topically 3 (three) times a week. 120 mL 3  ? levothyroxine (SYNTHROID) 100 MCG tablet Take 1 tablet (100 mcg total) by mouth daily before breakfast. 90 tablet 3  ? metoprolol succinate (TOPROL-XL) 100 MG 24 hr tablet Take 1 tablet (100 mg total) by mouth daily. Take with or immediately following a meal. 90 tablet 3  ? nitroGLYCERIN (NITROSTAT) 0.4 MG SL tablet Place 1 tablet (0.4 mg total) under the tongue every 5 (five) minutes as needed for chest pain. 20 tablet 3  ? ondansetron (ZOFRAN ODT) 4 MG disintegrating tablet Take 1 tablet (4 mg total) by mouth every 8 (eight) hours as needed. 20 tablet 6  ? pantoprazole (PROTONIX) 40 MG tablet TAKE 1 TABLET BY MOUTH EVERY DAY 90 tablet 3  ? tiZANidine (ZANAFLEX) 4 MG tablet Take 1 tablet (4 mg total) by mouth every 6 (six) hours as needed for muscle spasms. 30 tablet 6  ? triamcinolone (KENALOG) 0.1 % Apply  1 application topically 2 (two) times daily. Apply to AA. 80 g 0  ? Ubrogepant (UBRELVY) 50 MG TABS Take 50 mg by mouth daily as needed. 12 tablet 11  ? EPINEPHrine 0.3 mg/0.3 mL IJ SOAJ injection Inject 0.3 mLs (0.3 mg total) into the muscle as needed for anaphylaxis. (bee stings) 2 each 1  ? ?No facility-administered medications prior to visit.  ?  ? ?Per HPI unless specifically indicated in ROS section below ?Review of Systems ? ?Objective:  ?BP 130/74   Pulse 74   Temp (!) 97.5 ?F (36.4 ?C) (Temporal)   Ht '5\' 8"'$  (1.727 m)   Wt 266 lb 2 oz (120.7 kg)   SpO2 93%   BMI 40.46 kg/m?   ?Wt Readings from Last 3 Encounters:  ?12/18/21 266 lb 2 oz (120.7 kg)  ?07/04/21 254 lb (115.2 kg)  ?03/01/21 252 lb (114.3 kg)  ?  ?  ?Physical  Exam ?Vitals and nursing note reviewed.  ?Constitutional:   ?   Appearance: Normal appearance. He is not ill-appearing.  ?   Comments:  ?Ambulates with rolling walker ?Wearing life alert system  ?Cardiovascular:  ?   Rate and Rhythm: Normal rate and regular rhythm.  ?   Pulses: Normal pulses.  ?   Heart sounds: Normal heart sounds. No murmur heard. ?Pulmonary:  ?   Effort: Pulmonary effort is normal. No respiratory distress.  ?   Breath sounds: Normal breath sounds. No wheezing, rhonchi or rales.  ?Musculoskeletal:     ?   General: Deformity (R leg fixed in extension) present.  ?   Right lower leg: No edema.  ?   Left lower leg: No edema.  ?Neurological:  ?   Mental Status: He is alert.  ?Psychiatric:     ?   Mood and Affect: Mood normal.     ?   Behavior: Behavior normal.  ? ?   ?Results for orders placed or performed in visit on 07/04/21  ?Vitamin B12  ?Result Value Ref Range  ? Vitamin B-12 224 211 - 911 pg/mL  ?VITAMIN D 25 Hydroxy (Vit-D Deficiency, Fractures)  ?Result Value Ref Range  ? VITD 40.71 30.00 - 100.00 ng/mL  ?Renal function panel  ?Result Value Ref Range  ? Sodium 139 135 - 145 mEq/L  ? Potassium 4.3 3.5 - 5.1 mEq/L  ? Chloride 103 96 - 112 mEq/L  ? CO2 29 19 - 32 mEq/L  ? Albumin 4.2 3.5 - 5.2 g/dL  ? BUN 20 6 - 23 mg/dL  ? Creatinine, Ser 1.12 0.40 - 1.50 mg/dL  ? Glucose, Bld 95 70 - 99 mg/dL  ? Phosphorus 3.4 2.3 - 4.6 mg/dL  ? GFR 66.51 >60.00 mL/min  ? Calcium 8.9 8.4 - 10.5 mg/dL  ?TSH  ?Result Value Ref Range  ? TSH 0.40 0.35 - 5.50 uIU/mL  ?RPR  ?Result Value Ref Range  ? RPR Ser Ql NON-REACTIVE NON-REACTIVE  ? ? ?Assessment & Plan:  ?This visit occurred during the SARS-CoV-2 public health emergency.  Safety protocols were in place, including screening questions prior to the visit, additional usage of staff PPE, and extensive cleaning of exam room while observing appropriate contact time as indicated for disinfecting solutions.  ? ?Problem List Items Addressed This Visit   ? ? Chronic pain  syndrome  ?  Continues gabapentin, amitriptyline, tizanidine.  ?  ?  ? Chronic leg pain  ? Obesity, morbid, BMI 40.0-49.9 ([place])  ?  12 lb weight gain noted.  ?  Discussed aerobic exercise routine - encouraged slowly tapering duration of activity as tolerated.  ?  ?  ? Foot deformity  ?  ?charcot foot.  ?  ?  ? Status post amputation of great toe, left ([place])  ?  Initial amputation 2021 due to osteomyelitis with gangrene.  ?Considering further amputation given poorly healing ulcer. Followed by ortho.  ?  ?  ? Numbness of foot  ?  Chronic, on gabapentin.  ?  ?  ? Vitamin B12 deficiency - Primary  ?  Hasn't noticed significant benefit with B12 shots. Latest one done today. Will need updated B12 levels next labwork.   ?  ?  ? Chronic toe ulcer, left, with fat layer exposed ([place])  ?  Appreciate ortho care [name]).  ?  ?  ? Memory difficulty  ?  Stable period. Previously had reassuring evaluation witih MMSE 27/30.  ?  ?  ?  ? ?Meds ordered this encounter  ?Medications  ? cyanocobalamin ((VITAMIN B-12)) injection 1,000 mcg  ? EPINEPHrine 0.3 mg/0.3 mL IJ SOAJ injection  ?  Sig: Inject 0.3 mg into the muscle as needed for anaphylaxis. (bee stings)  ?  Dispense:  2 each  ?  Refill:  1  ? ?No orders of the defined types were placed in this encounter. ? ? ?Patient Instructions  ?B12 shot today  ?You are doing well today ?Continue current medicines.  ?Incorporate exercise into routine is good - slowly titrate.  ?Return in 2 months for wellness visit/physical.  ? ?Follow up plan: ?Return in about 2 months (around 02/17/2022), or if symptoms worsen or fail to improve, for annual exam, prior fasting for blood work, medicare wellness visit. ? ?[name], MD   ?

## 2021-12-19 NOTE — Assessment & Plan Note (Signed)
?  charcot foot.  ?

## 2021-12-19 NOTE — Assessment & Plan Note (Addendum)
Stable period. Previously had reassuring evaluation witih MMSE 27/30.  ?

## 2021-12-19 NOTE — Assessment & Plan Note (Signed)
Hasn't noticed significant benefit with B12 shots. Latest one done today. Will need updated B12 levels next labwork.   ?

## 2021-12-19 NOTE — Assessment & Plan Note (Signed)
Chronic, on gabapentin.  ?

## 2021-12-19 NOTE — Assessment & Plan Note (Signed)
Continues gabapentin, amitriptyline, tizanidine.  ?

## 2021-12-19 NOTE — Assessment & Plan Note (Signed)
Initial amputation 2021 due to osteomyelitis with gangrene.  ?Considering further amputation given poorly healing ulcer. Followed by ortho.  ?

## 2021-12-19 NOTE — Assessment & Plan Note (Signed)
12 lb weight gain noted.  ?Discussed aerobic exercise routine - encouraged slowly tapering duration of activity as tolerated.  ?

## 2021-12-19 NOTE — Assessment & Plan Note (Signed)
Appreciate ortho care [name]).  ?

## 2021-12-22 ENCOUNTER — Other Ambulatory Visit: Payer: Self-pay | Admitting: Family Medicine

## 2021-12-25 ENCOUNTER — Telehealth: Payer: Self-pay

## 2021-12-25 NOTE — Chronic Care Management (AMB) (Unsigned)
Chronic Care Management Pharmacy Assistant   Name: Nicholas [name]  MRN: [id number] DOB: 1951/09/08   Reason for Encounter: Medication Adherence  and Delivery Coordination   Recent office visits:  12/18/21-PCP-Nicholas Gutierrez,MD-Patient presented for  B12 shot.    Hasn't noticed significant benefit with B12 shots. Latest one done today. Will need updated B12 levels next labwork.  No medication changes.      Recent consult visits:  11/30/21-Orthopedic Surgery-Nicholas Duda,MD-Patient presented for follow up left toe ulcer.Continue with conservative treatment discussed the possibility of bony resection of the base of the proximal phalanx left great toe.  Debridement and silver nitrate applied.No medication changes  Hospital visits:  None in previous 6 months  Medications: Outpatient Encounter Medications as of 12/25/2021  Medication Sig   acetaminophen (TYLENOL) 500 MG tablet Take 1,000 mg by mouth 2 (two) times daily. IN THE AFTERNOON & AT NIGHT   amitriptyline (ELAVIL) 50 MG tablet TAKE 1-2 TABLETS BY MOUTH AT BEDTIME.   atorvastatin (LIPITOR) 20 MG tablet Take 1 tablet (20 mg total) by mouth daily.   Cholecalciferol (VITAMIN D3) 25 MCG (1000 UT) CAPS Take 1 capsule (1,000 Units total) by mouth daily.   clotrimazole (LOTRIMIN) 1 % cream Apply 1 application topically 2 (two) times daily. To groin (Patient taking differently: Apply 1 application. topically 3 (three) times daily. To groin)   EPINEPHrine 0.3 mg/0.3 mL IJ SOAJ injection Inject 0.3 mg into the muscle as needed for anaphylaxis. (bee stings)   ferrous sulfate 325 (65 FE) MG EC tablet Take 325 mg by mouth every other day.   furosemide (LASIX) 20 MG tablet TAKE ONE TABLET BY MOUTH EVERY MORNING   gabapentin (NEURONTIN) 300 MG capsule TAKE ONE CAPSULE BY MOUTH EVERY MORNING and TAKE TWO CAPSULES AT NOON and TAKE TWO CAPSULES EVERY EVENING   ketoconazole (NIZORAL) 2 % shampoo Apply 1 application topically 3 (three) times a week.    levothyroxine (SYNTHROID) 100 MCG tablet Take 1 tablet (100 mcg total) by mouth daily before breakfast.   metoprolol succinate (TOPROL-XL) 100 MG 24 hr tablet Take 1 tablet (100 mg total) by mouth daily. Take with or immediately following a meal.   nitroGLYCERIN (NITROSTAT) 0.4 MG SL tablet Place 1 tablet (0.4 mg total) under the tongue every 5 (five) minutes as needed for chest pain.   ondansetron (ZOFRAN ODT) 4 MG disintegrating tablet Take 1 tablet (4 mg total) by mouth every 8 (eight) hours as needed.   pantoprazole (PROTONIX) 40 MG tablet TAKE 1 TABLET BY MOUTH EVERY DAY   tiZANidine (ZANAFLEX) 4 MG tablet Take 1 tablet (4 mg total) by mouth every 6 (six) hours as needed for muscle spasms.   Ubrogepant (UBRELVY) 50 MG TABS Take 50 mg by mouth daily as needed.   No facility-administered encounter medications on file as of 12/25/2021.   BP Readings from Last 3 Encounters:  12/18/21 130/74  07/04/21 120/70  03/01/21 121/84    Lab Results  Component Value Date   HGBA1C 5.6 07/13/2010      {Upstream med sync 1:25020} {Upstream med sync 2:25021}   Last adherence delivery date:12/05/21      Patient is due for next adherence delivery on: 01/04/22  {Med Review:25223}  This delivery to include: Adherence Packaging  30 Days  Packs: Gabapentin '300mg'$  - take 1 tablet breakfast, 2 tablets lunch,2 tablets evening meal Furosemide '20mg'$  - take 1 tablet daily breakfast  daily Pantoprazole '40mg'$  - take 1 tablet at breakfast Atorvastatin '20mg'$  -  take 1 tablet at breakfast Vitamin D3 72mg - take 1 capsule breakfast Ferrous sulfate 324 mg - take 1 tablet at breakfast  daily  Metoprolol succinate '100mg'$  - take 1 tablet at bedtime  Levothyroxine 1033m - take 1 tablet at breakfast Amitriptyline '50mg'$  - take 2 tablets at bedtime  VIAL medications: NitroStat 0.'4mg'$  sublingual- use when needed    Patient declined the following medications this month: UbRoselyn Meieruse as needed   Any concerns about  your medications? {yes/no:20286}  How often do you forget or accidentally miss a dose? {Missed doses:25554}  Do you use a pillbox? {yes/no:20286}  Is patient in packaging {yes/no:20286}  If yes  What is the date on your next pill pack?  Any concerns or issues with your packaging?   {refills needed:25320}  {Delivery date:25786}  Recent blood pressure readings are as follows:***   Recent blood glucose readings are as follows:*** Fasting:  Before Meals:  After Meals:  Bedtime:   Annual wellness visit in last year? Yes Most Recent BP reading:130/74  74-P    LiCharlene BrookeCPP notified  VeAvel [phone]

## 2021-12-25 NOTE — Telephone Encounter (Signed)
Script was sent to [facility] for Elavil 03/12/21 #180/3 ?Request is from [facility] ?Last office visit 12/18/21  ?  ?

## 2021-12-28 ENCOUNTER — Ambulatory Visit (INDEPENDENT_AMBULATORY_CARE_PROVIDER_SITE_OTHER): Payer: Medicare Other | Admitting: Orthopedic Surgery

## 2021-12-28 DIAGNOSIS — L97529 Non-pressure chronic ulcer of other part of left foot with unspecified severity: Secondary | ICD-10-CM

## 2021-12-31 ENCOUNTER — Encounter: Payer: Self-pay | Admitting: Orthopedic Surgery

## 2021-12-31 NOTE — Progress Notes (Signed)
? ?Office Visit Note ?  ?Patient: Nicholas [name]           ?Date of Birth: 10-22-1950           ?MRN: [id number] ?Visit Date: 12/28/2021 ?             ?Requested by: [name], MD ?[address] ?[place],  [place] [address] ?PCP: [name], MD ? ?Chief Complaint  ?Patient presents with  ? Left Foot - Follow-up  ? ? ? ? ?HPI: ?Patient is a 71 year old gentleman who presents in follow-up for left foot ulceration beneath the first metatarsal head.  Patient is currently wearing orthotics. ? ?Assessment & Plan: ?Visit Diagnoses:  ?1. Chronic ulcer of toe of left foot, unspecified ulcer stage ([place])   ? ? ?Plan: Patient will continue with his orthotics and protective shoe wear.  Discussed the possibility of resection of the proximal phalanx of the great toe if he has recurrent ulceration. ? ?Follow-Up Instructions: Return if symptoms worsen or fail to improve.  ? ?Ortho Exam ? ?Patient is alert, oriented, no adenopathy, well-dressed, normal affect, normal respiratory effort. ?Examination patient has good dorsiflexion of ankle about 20 degrees.  The ulcer is healed the callus is pared.  He does have a residual proximal phalanx of the left great toe.  There is no ulcerations on the right foot. ? ?Imaging: ?No results found. ?No images are attached to the encounter. ? ?Labs: ?Lab Results  ?Component Value Date  ? HGBA1C 5.6 07/13/2010  ? HGBA1C  12/08/2008  ?  5.2 ?(NOTE)   The ADA recommends the following therapeutic goal for glycemic   control related to Hgb A1C measurement:   Goal of Therapy:   < 7.0% Hgb A1C   Reference: American Diabetes Association: Clinical Practice   Recommendations 2008, Diabetes Care,  ?2008, 31:(Suppl 1).  ? HGBA1C  11/23/2008  ?  5.1 ?(NOTE)   The ADA recommends the following therapeutic goal for glycemic   control related to Hgb A1C measurement:   Goal of Therapy:   < 7.0% Hgb A1C   Reference: American Diabetes Association: Clinical Practice   Recommendations 2008, Diabetes  Care,  ?2008, 31:(Suppl 1).  ? ESRSEDRATE 28 (H) 03/01/2021  ? ESRSEDRATE 18 08/03/2020  ? ESRSEDRATE 22 (H) 01/11/2012  ? LABURIC 8.8 (H) 03/01/2021  ? LABURIC 5.0 02/08/2016  ? REPTSTATUS 05/07/2018 FINAL 05/05/2018  ? GRAMSTAIN  03/29/2009  ?  NO WBC SEEN ?NO SQUAMOUS EPITHELIAL CELLS SEEN ?NO ORGANISMS SEEN  ? GRAMSTAIN  03/29/2009  ?  NO WBC SEEN ?NO SQUAMOUS EPITHELIAL CELLS SEEN ?NO ORGANISMS SEEN  ? CULT (A) 05/05/2018  ?  <10,000 COLONIES/mL INSIGNIFICANT GROWTH ?Performed at [facility], [place] [address] [place], [place] [address] ?  ? LABORGA STAPHYLOCOCCUS SPECIES (COAGULASE NEGATIVE) 11/21/2008  ? ? ? ?Lab Results  ?Component Value Date  ? ALBUMIN 4.2 07/04/2021  ? ALBUMIN 4.0 12/08/2020  ? ALBUMIN 4.1 08/03/2020  ? ? ?Lab Results  ?Component Value Date  ? MG 2.1 04/12/2016  ? MG 1.8 03/31/2009  ? MG 1.7 03/29/2009  ? ?Lab Results  ?Component Value Date  ? VD25OH 40.71 07/04/2021  ? VD25OH 25.83 (L) 12/08/2020  ? VD25OH 45.00 06/15/2020  ? ? ?No results found for: PREALBUMIN ?CBC EXTENDED Latest Ref Rng & Units 03/01/2021 03/01/2021 12/08/2020  ?WBC 4.0 - 10.5 K/uL 7.9 8.6 8.3  ?RBC 4.22 - 5.81 MIL/uL 4.40 4.54 4.61  ?HGB 13.0 - 17.0 g/dL 14.2 14.5  15.1  ?HCT 39.0 - 52.0 % 42.8 43.8 44.2  ?PLT 150 - 400 K/uL 201 215.0 222.0  ?NEUTROABS 1.4 - 7.7 K/uL - 5.2 5.3  ?LYMPHSABS 0.7 - 4.0 K/uL - 1.7 1.6  ? ? ? ?There is no height or weight on file to calculate BMI. ? ?Orders:  ?No orders of the defined types were placed in this encounter. ? ?No orders of the defined types were placed in this encounter. ? ? ? Procedures: ?No procedures performed ? ?Clinical Data: ?No additional findings. ? ?ROS: ? ?All other systems negative, except as noted in the HPI. ?Review of Systems ? ?Objective: ?Vital Signs: There were no vitals taken for this visit. ? ?Specialty Comments:  ?No specialty comments available. ? ?PMFS History: ?Patient Active Problem List  ? Diagnosis Date Noted  ? Recurrent falls 08/25/2021  ? Memory  difficulty 07/04/2021  ? Chronic toe ulcer, left, with fat layer exposed ([place]) 01/23/2021  ? Pituitary adenoma ([place]) 09/06/2020  ? Chronic intractable headache 07/27/2020  ? Vitamin B12 deficiency 03/31/2020  ? Vitamin D deficiency 03/31/2020  ? Numbness of foot 03/25/2020  ? Right hand pain 12/14/2019  ? Status post amputation of great toe, left ([place]) 12/14/2019  ? Callus of foot 04/02/2019  ? Allergy to bee sting 03/11/2019  ? Insomnia 02/10/2019  ? Status post lumbar spine operative procedure for decompression of spinal cord 12/12/2018  ? Dyslipidemia 10/19/2018  ? Foot deformity 10/19/2018  ? Spinal stenosis of lumbar region without neurogenic claudication 10/01/2018  ? Facial rash 05/26/2018  ? Status post reverse total replacement of right shoulder 05/13/2018  ? CHF (congestive heart failure) ([place]) 12/27/2017  ? OSA (obstructive sleep apnea) 12/27/2017  ? Health maintenance examination 11/25/2017  ? CKD (chronic kidney disease) stage 3, GFR 30-59 ml/min (H[facility]) 10/01/2017  ? Fatty liver 10/01/2017  ? Status post reverse total replacement of left shoulder 09/03/2017  ? Bilateral shoulder pain 05/24/2017  ? History of CVA in adulthood 03/08/2017  ? S/P total knee arthroplasty, left 01/30/2017  ? Arthritis of knee 01/15/2017  ? Skin rash 12/10/2016  ? Medicare annual wellness visit, subsequent 10/05/2016  ? Advanced care planning/counseling discussion 10/05/2016  ? Obesity, morbid, BMI 40.0-49.9 ([place]) 07/19/2016  ? Hypothyroidism   ? Iron deficiency 04/23/2016  ? Chronic pain syndrome 04/23/2016  ? Inflammatory arthritis 04/23/2016  ? Chronic leg pain 04/23/2016  ? Lesion of palate 04/23/2016  ? Grieving 04/23/2016  ? GERD (gastroesophageal reflux disease)   ? Duodenitis determined by biopsy 02/13/2016  ? Diverticulosis of colon without hemorrhage 01/31/2016  ? Chest pain 01/11/2012  ? Migraine 01/11/2012  ? History of methicillin resistant staphylococcus aureus (MRSA) 07/04/2010  ? DDD (degenerative disc disease),  lumbar 07/04/2010  ? History of total right knee replacement 07/04/2010  ? Primary osteoarthritis, right shoulder 06/09/2007  ? Personal history of colonic adenoma 06/01/2003  ? ?Past Medical History:  ?Diagnosis Date  ? Acute kidney failure 08/2008  ? "cleared up"  no problems since  ? Allergy   ? Anxiety   ? Arthritis   ? Asthma   ? ?of this no inhaler  ? Blood transfusion   ? CHF (congestive heart failure) ([place])   ? ?of this, pt denies  ? CLOSTRIDIUM DIFFICILE COLITIS 07/04/2010  ? Annotation: 12/09, 2/10 Qualifier: Diagnosis of  By: [name], [name]    ? Depression with anxiety   ? Duodenitis determined by biopsy 02/2016  ? peptic likely due to aleve (erosive gastropathy with duodenal  erosions)  ? ECZEMA 07/04/2010  ? Qualifier: Diagnosis of  By: [name], [name]    ? Elevated liver enzymes   ? Full dentures   ? GERD (gastroesophageal reflux disease)   ? Heart attack ([place])   ? 08/2008 (likely demand ischemia in the setting of MSSA sepsis/R TKA  infection)10-2008  ? History of hiatal hernia   ? History of stomach ulcers   ? Hypothyroidism   ? Lower GI bleeding   ? MALAR AND MAXILLARY BONES CLOSED FRACTURE 07/04/2010  ? Annotation: ORIF Qualifier: Diagnosis of  By: [name], [name]    ? Migraine   ? "definitely"  ? MRSA (methicillin resistant Staphylococcus aureus)   ? in leg, had to place steel rod in leg  ? Personal history of colonic adenoma 06/01/2003  ? PFO (patent foramen ovale)   ? small PFO by 08/2008 TEE  ? Pneumonia 08/2008  ? "while in ICU"  ? Seizures ([place]) 2009  ? "long time ago"    ? Sleep apnea   ? does not wear CPAP  ? Stroke [facility]) 2010  ? unable to complete sentences at times  ? Wears glasses   ?  ?Family History  ?Problem Relation Age of Onset  ? Coronary artery disease Father 59  ? Stroke Father   ? CAD Mother 10  ?     stents  ? Breast cancer Sister   ? Diabetes Brother   ? Diabetes Sister   ? Colon cancer Neg Hx   ? Stomach cancer Neg Hx   ?  ?Past Surgical History:  ?Procedure Laterality Date  ?  anterior  nerve transposition  07/2009  ? left ulnar nerve  ? antibiotic spacer exchange  11/2008; 08/2006  ? right knee  ? ARTHROTOMY  08/2008  ? right knee w/I&D  ? CARDIOVASCULAR STRESS TEST  2013  ? s

## 2022-01-08 ENCOUNTER — Ambulatory Visit: Payer: Medicare Other | Admitting: Neurology

## 2022-01-15 NOTE — Progress Notes (Signed)
? ? ?HISTORICAL ? ?Nicholas [name] is a 71 year old male, seen in request by his primary care physician Dr. [name] no frequent headaches, initial evaluation was on September 29, 2020. ? ?I reviewed and summarized the referring note. ?hypothyroidism, on supplement,  ?HLD ?CHF ?CAD, ?Stroke in 2018, right side weakness ?Right knee replacement MRSA in 2009, surgery in 2010,  ?Left ulnar nerve transposition surgery  ?Bilateral shoulder replacement. ? ?He reported a history of migraine headaches since 71 years old, his typical migraine is holoacranial severe pounding headache with associated light, noise sensitivity, nauseous, over the years, he was treated with different over-the-counter medications, and triptan treatment without significant improvement, in his 40s, he was put on different preventive medications as well, there was no significant change per patient ? ?He reported a history of heart attack, stroke, also had a history of difficult right knee replacement, complicated by prolonged MRSA infection, require prolonged antibiotic treatment. ? ?He now complains of 1 to twice migraine headache each week, bilateral temporal retro-orbital region, sharp, moderate severe pain, with associated light, noise sensitivity, nauseous, lasting 1 to few days, Imitrex 100 mg provides limited help, ? ?I personally reviewed MRI of the brain with and without contrast in November 2021, 10 mm hypoenhancing lesion within the left side of the pituitary gland likely representing a pituitary micro adenoma. Mild ?deviation of the pituitary stalk to the right. No suprasellar component.  Mild supratentorium small vessel disease, ? ?He denies auras with his migraine ? ?Update January 05, 2021 SS: Here today alone, lives in senior living apartment. Added Depakote ER 500 mg at bedtime last time, remains on gabapentin, metoprolol. Migraines used to be 1-2 a week, now 1 every 2-3 weeks. The Roselyn Meier works quickly, takes Zofran, tizanidine.   Labs reviewed from PCP February 2022, CMP looked okay.  ? ?Update January 16, 2022 SS: Here today alone, stopped the Depakote, unsure why or when (Dec 2022 note from pharmacy he was off > 6 months wasn't taking correctly). Right now 2-3 migraines a month, takes Tylenol for headache without help, lays down, can take a few days to pass. On amitriptyline for sleep; also gabapentin, Toprol-XL that could benefit migraines . Right now allergies trigger migraines. CT head in May 2022 no acute problem, stable. Seeing [name] about foot ulcer left, may need amputation of great toe.  ? ?REVIEW OF SYSTEMS: Full 14 system review of systems performed and notable only for as above. ? ?See HPI ? ?ALLERGIES: ?Allergies  ?Allergen Reactions  ? Peanut-Containing Drug Products Anaphylaxis and Dermatitis  ? Yellow Jacket Venom [Bee Venom] Anaphylaxis and Other (See Comments)  ?  Respiratory Distress  ? Celebrex [Celecoxib] Other (See Comments)  ?  BLACK STOOL?MELENA?  ? Percocet [Oxycodone-Acetaminophen] Hives, Swelling and Other (See Comments)  ? Latex Rash  ? Nsaids Rash  ? ? ?HOME MEDICATIONS: ?Current Outpatient Medications  ?Medication Sig Dispense Refill  ? acetaminophen (TYLENOL) 500 MG tablet Take 1,000 mg by mouth 2 (two) times daily. IN THE AFTERNOON & AT NIGHT    ? amitriptyline (ELAVIL) 50 MG tablet TAKE TWO TABLETS BY MOUTH EVERYDAY AT BEDTIME 180 tablet 2  ? atorvastatin (LIPITOR) 20 MG tablet Take 1 tablet (20 mg total) by mouth daily. 90 tablet 3  ? B Complex Vitamins (B-COMPLEX/B-12 PO) Take by mouth.    ? Cholecalciferol (VITAMIN D3) 25 MCG (1000 UT) CAPS Take 1 capsule (1,000 Units total) by mouth daily. 90 capsule 1  ? clotrimazole (LOTRIMIN) 1 % cream  Apply 1 application topically 2 (two) times daily. To groin (Patient taking differently: Apply 1 application. topically 3 (three) times daily. To groin) 60 g 1  ? EPINEPHrine 0.3 mg/0.3 mL IJ SOAJ injection Inject 0.3 mg into the muscle as needed for anaphylaxis. (bee  stings) 2 each 1  ? ferrous sulfate 325 (65 FE) MG EC tablet Take 325 mg by mouth every other day.    ? furosemide (LASIX) 20 MG tablet TAKE ONE TABLET BY MOUTH EVERY MORNING 90 tablet 1  ? gabapentin (NEURONTIN) 300 MG capsule TAKE ONE CAPSULE BY MOUTH EVERY MORNING and TAKE TWO CAPSULES AT NOON and TAKE TWO CAPSULES EVERY EVENING 450 capsule 1  ? ketoconazole (NIZORAL) 2 % shampoo Apply 1 application topically 3 (three) times a week. 120 mL 3  ? levothyroxine (SYNTHROID) 100 MCG tablet Take 1 tablet (100 mcg total) by mouth daily before breakfast. 90 tablet 3  ? metoprolol succinate (TOPROL-XL) 100 MG 24 hr tablet Take 1 tablet (100 mg total) by mouth daily. Take with or immediately following a meal. 90 tablet 3  ? nitroGLYCERIN (NITROSTAT) 0.4 MG SL tablet Place 1 tablet (0.4 mg total) under the tongue every 5 (five) minutes as needed for chest pain. 20 tablet 3  ? ondansetron (ZOFRAN ODT) 4 MG disintegrating tablet Take 1 tablet (4 mg total) by mouth every 8 (eight) hours as needed. 20 tablet 6  ? pantoprazole (PROTONIX) 40 MG tablet TAKE 1 TABLET BY MOUTH EVERY DAY 90 tablet 3  ? tiZANidine (ZANAFLEX) 4 MG tablet Take 1 tablet (4 mg total) by mouth every 6 (six) hours as needed for muscle spasms. 30 tablet 6  ? Ubrogepant (UBRELVY) 50 MG TABS Take 50 mg by mouth daily as needed. 12 tablet 11  ? ?No current facility-administered medications for this visit.  ? ? ?PAST MEDICAL HISTORY: ?Past Medical History:  ?Diagnosis Date  ? Acute kidney failure 08/2008  ? "cleared up"  no problems since  ? Allergy   ? Anxiety   ? Arthritis   ? Asthma   ? ?of this no inhaler  ? Blood transfusion   ? CHF (congestive heart failure) ([place])   ? ?of this, pt denies  ? CLOSTRIDIUM DIFFICILE COLITIS 07/04/2010  ? Annotation: 12/09, 2/10 Qualifier: Diagnosis of  By: [name], [name]    ? Depression with anxiety   ? Duodenitis determined by biopsy 02/2016  ? peptic likely due to aleve (erosive gastropathy with duodenal erosions)  ? ECZEMA  07/04/2010  ? Qualifier: Diagnosis of  By: [name], [name]    ? Elevated liver enzymes   ? Full dentures   ? GERD (gastroesophageal reflux disease)   ? Heart attack ([place])   ? 08/2008 (likely demand ischemia in the setting of MSSA sepsis/R TKA  infection)10-2008  ? History of hiatal hernia   ? History of stomach ulcers   ? Hypothyroidism   ? Lower GI bleeding   ? MALAR AND MAXILLARY BONES CLOSED FRACTURE 07/04/2010  ? Annotation: ORIF Qualifier: Diagnosis of  By: [name], [name]    ? Migraine   ? "definitely"  ? MRSA (methicillin resistant Staphylococcus aureus)   ? in leg, had to place steel rod in leg  ? Personal history of colonic adenoma 06/01/2003  ? PFO (patent foramen ovale)   ? small PFO by 08/2008 TEE  ? Pneumonia 08/2008  ? "while in ICU"  ? Seizures ([place]) 2009  ? "long time ago"    ? Sleep  apnea   ? does not wear CPAP  ? Stroke [facility]) 2010  ? unable to complete sentences at times  ? Wears glasses   ? ? ?PAST SURGICAL HISTORY: ?Past Surgical History:  ?Procedure Laterality Date  ? anterior  nerve transposition  07/2009  ? left ulnar nerve  ? antibiotic spacer exchange  11/2008; 08/2006  ? right knee  ? ARTHROTOMY  08/2008  ? right knee w/I&D  ? CARDIOVASCULAR STRESS TEST  2013  ? stress EKG - negative for ischemia, 4 min 7.3 METs, normal blood pressure response  ? CATARACT EXTRACTION    ? COLONOSCOPY  11/2012  ? diverticulosis, rpt 10 yrs [name])  ? ESOPHAGOGASTRODUODENOSCOPY  02/2016  ? erosive gastropathy with duodenal erosions [name])  ? HARDWARE REMOVAL  04/2006  ? right knee w/antibiotic spacers placed  ? INGUINAL HERNIA REPAIR  early 1990's  ? bilateral  ? JOINT REPLACEMENT Bilateral   ? KNEE ARTHROSCOPY  10/2001  ? right  ? KNEE FUSION  03/2009  ? right knee removal; antibiotic spacers;   ? LEFT HEART CATH AND CORONARY ANGIOGRAPHY N/A 02/04/2018  ?  WNL (Patwardhan, Reynold Bowen, MD)  ? LUMBAR LAMINECTOMY/DECOMPRESSION MICRODISCECTOMY N/A 12/05/2018  ? Procedure: LUMBAR THREE TO FOUR AND LUMBAR FOUR TO FIVE  DECOMPRESSION;  Surgeon: [name], MD;  Location: [facility];  Service: Orthopedics;  Laterality: N/A;  ? MULTIPLE TOOTH EXTRACTIONS    ? REPLACEMENT TOTAL KNEE  08/2008; 09/2001  ? right  ? REVERSE

## 2022-01-16 ENCOUNTER — Encounter: Payer: Self-pay | Admitting: Neurology

## 2022-01-16 ENCOUNTER — Ambulatory Visit (INDEPENDENT_AMBULATORY_CARE_PROVIDER_SITE_OTHER): Payer: Medicare Other | Admitting: Orthopedic Surgery

## 2022-01-16 ENCOUNTER — Telehealth: Payer: Self-pay | Admitting: *Deleted

## 2022-01-16 ENCOUNTER — Ambulatory Visit (INDEPENDENT_AMBULATORY_CARE_PROVIDER_SITE_OTHER): Payer: Medicare Other | Admitting: Neurology

## 2022-01-16 VITALS — BP 124/77 | HR 59 | Ht 68.0 in | Wt 267.0 lb

## 2022-01-16 DIAGNOSIS — R519 Headache, unspecified: Secondary | ICD-10-CM

## 2022-01-16 DIAGNOSIS — G43109 Migraine with aura, not intractable, without status migrainosus: Secondary | ICD-10-CM | POA: Diagnosis not present

## 2022-01-16 DIAGNOSIS — L97529 Non-pressure chronic ulcer of other part of left foot with unspecified severity: Secondary | ICD-10-CM

## 2022-01-16 DIAGNOSIS — G8929 Other chronic pain: Secondary | ICD-10-CM

## 2022-01-16 MED ORDER — UBRELVY 50 MG PO TABS: 50.0000 mg | ORAL_TABLET | Freq: Every day | ORAL | 11 refills | Status: DC | PRN

## 2022-01-16 MED ORDER — DIVALPROEX SODIUM ER 500 MG PO TB24: 500.0000 mg | ORAL_TABLET | Freq: Every day | ORAL | 5 refills | Status: DC

## 2022-01-16 MED ORDER — ONDANSETRON 4 MG PO TBDP
4.0000 mg | ORAL_TABLET | Freq: Three times a day (TID) | ORAL | 3 refills | Status: DC | PRN
Start: 2022-01-16 — End: 2022-04-20

## 2022-01-16 MED ORDER — TIZANIDINE HCL 4 MG PO TABS: 4.0000 mg | ORAL_TABLET | Freq: Four times a day (QID) | ORAL | 1 refills | Status: DC | PRN

## 2022-01-16 NOTE — Patient Instructions (Addendum)
Add on Depakote ER 500 mg at bedtime for headache prevention  ?For acute headache treatment take Nicholas [name], can combine with Zofran and Tizanidine for prolonged headache ?Return back in 6 months, call back for any problems  ? ?Meds ordered this encounter  ?Medications  ? Ubrogepant (UBRELVY) 50 MG TABS  ?  Sig: Take 50 mg by mouth daily as needed.  ?  Dispense:  12 tablet  ?  Refill:  11  ? tiZANidine (ZANAFLEX) 4 MG tablet  ?  Sig: Take 1 tablet (4 mg total) by mouth every 6 (six) hours as needed (for acute headache treatment).  ?  Dispense:  20 tablet  ?  Refill:  1  ? ondansetron (ZOFRAN ODT) 4 MG disintegrating tablet  ?  Sig: Take 1 tablet (4 mg total) by mouth every 8 (eight) hours as needed.  ?  Dispense:  20 tablet  ?  Refill:  3  ? divalproex (DEPAKOTE ER) 500 MG 24 hr tablet  ?  Sig: Take 1 tablet (500 mg total) by mouth at bedtime.  ?  Dispense:  30 tablet  ?  Refill:  5  ? ? ?

## 2022-01-16 NOTE — Telephone Encounter (Signed)
PA for Ubrelvy '50mg'$  started on covermymeds (key: [id number]). Pharmacy coverage through [employer] [phone]). Decision pending. ?

## 2022-01-16 NOTE — Telephone Encounter (Signed)
[id number] approved through 10/14/2022. ?

## 2022-01-17 ENCOUNTER — Encounter: Payer: Self-pay | Admitting: Orthopedic Surgery

## 2022-01-17 ENCOUNTER — Telehealth: Payer: Self-pay | Admitting: *Deleted

## 2022-01-17 LAB — CBC WITH DIFFERENTIAL/PLATELET
Basophils Absolute: 0.1 10*3/uL (ref 0.0–0.2)
Basos: 1 %
EOS (ABSOLUTE): 0.3 10*3/uL (ref 0.0–0.4)
Eos: 4 %
Hematocrit: 45.5 % (ref 37.5–51.0)
Hemoglobin: 15.8 g/dL (ref 13.0–17.7)
Immature Grans (Abs): 0 10*3/uL (ref 0.0–0.1)
Immature Granulocytes: 0 %
Lymphocytes Absolute: 1.6 10*3/uL (ref 0.7–3.1)
Lymphs: 20 %
MCH: 32 pg (ref 26.6–33.0)
MCHC: 34.7 g/dL (ref 31.5–35.7)
MCV: 92 fL (ref 79–97)
Monocytes Absolute: 1.2 10*3/uL — ABNORMAL HIGH (ref 0.1–0.9)
Monocytes: 15 %
Neutrophils Absolute: 5.1 10*3/uL (ref 1.4–7.0)
Neutrophils: 60 %
Platelets: 213 10*3/uL (ref 150–450)
RBC: 4.93 x10E6/uL (ref 4.14–5.80)
RDW: 12.6 % (ref 11.6–15.4)
WBC: 8.4 10*3/uL (ref 3.4–10.8)

## 2022-01-17 LAB — COMPREHENSIVE METABOLIC PANEL
ALT: 20 IU/L (ref 0–44)
AST: 25 IU/L (ref 0–40)
Albumin/Globulin Ratio: 1.8 (ref 1.2–2.2)
Albumin: 4.6 g/dL (ref 3.7–4.7)
Alkaline Phosphatase: 95 IU/L (ref 44–121)
BUN/Creatinine Ratio: 14 (ref 10–24)
BUN: 19 mg/dL (ref 8–27)
Bilirubin Total: 2 mg/dL — ABNORMAL HIGH (ref 0.0–1.2)
CO2: 24 mmol/L (ref 20–29)
Calcium: 8.9 mg/dL (ref 8.6–10.2)
Chloride: 100 mmol/L (ref 96–106)
Creatinine, Ser: 1.38 mg/dL — ABNORMAL HIGH (ref 0.76–1.27)
Globulin, Total: 2.6 g/dL (ref 1.5–4.5)
Glucose: 106 mg/dL — ABNORMAL HIGH (ref 70–99)
Potassium: 4.1 mmol/L (ref 3.5–5.2)
Sodium: 140 mmol/L (ref 134–144)
Total Protein: 7.2 g/dL (ref 6.0–8.5)
eGFR: 55 mL/min/{1.73_m2} — ABNORMAL LOW (ref >59)

## 2022-01-17 NOTE — Telephone Encounter (Signed)
Left patient a detailed message, with results, on voicemail (ok per DPR).  Provided our number to call back with any questions.  

## 2022-01-17 NOTE — Progress Notes (Signed)
? ?Office Visit Note ?  ?Patient: Nicholas [name]           ?Date of Birth: 1951/09/06           ?MRN: [id number] ?Visit Date: 01/16/2022 ?             ?Requested by: [name], MD ?[address] ?[place],  [place] [address] ?PCP: [name], MD ? ?Chief Complaint  ?Patient presents with  ? Left Foot - Pain  ? ? ? ? ?HPI: ?Patient is a 71 year old gentleman who presents for evaluation of both lower extremities.  Patient has had an ulcer on the left foot and is concerned about possibly developing ulceration on the right foot. ? ?Assessment & Plan: ?Visit Diagnoses:  ?1. Chronic ulcer of toe of left foot, unspecified ulcer stage ([place])   ? ? ?Plan: Due to the pain and deformity patient states she would like to proceed with amputation of the residual proximal phalanx left great toe we will set this up as outpatient surgery at his convenience risk and benefits were discussed including persistent pain nonhealing of the wound need for additional surgery.  Patient states he understands wished to proceed at this time. ? ?Follow-Up Instructions: No follow-ups on file.  ? ?Ortho Exam ? ?Patient is alert, oriented, no adenopathy, well-dressed, normal affect, normal respiratory effort. ?Examination patient has a palpable pulse bilaterally.  He has had a recent fall secondary to weakness from chronic back pain.  Patient still has pain over the residual left great toe proximal phalanx the ulcer has healed there is no cellulitis there is tenderness to palpation of the MTP joint.  Right foot shows no ulcerations. ? ?Imaging: ?No results found. ?No images are attached to the encounter. ? ?Labs: ?Lab Results  ?Component Value Date  ? HGBA1C 5.6 07/13/2010  ? HGBA1C  12/08/2008  ?  5.2 ?(NOTE)   The ADA recommends the following therapeutic goal for glycemic   control related to Hgb A1C measurement:   Goal of Therapy:   < 7.0% Hgb A1C   Reference: American Diabetes Association: Clinical Practice   Recommendations 2008,  Diabetes Care,  ?2008, 31:(Suppl 1).  ? HGBA1C  11/23/2008  ?  5.1 ?(NOTE)   The ADA recommends the following therapeutic goal for glycemic   control related to Hgb A1C measurement:   Goal of Therapy:   < 7.0% Hgb A1C   Reference: American Diabetes Association: Clinical Practice   Recommendations 2008, Diabetes Care,  ?2008, 31:(Suppl 1).  ? ESRSEDRATE 28 (H) 03/01/2021  ? ESRSEDRATE 18 08/03/2020  ? ESRSEDRATE 22 (H) 01/11/2012  ? LABURIC 8.8 (H) 03/01/2021  ? LABURIC 5.0 02/08/2016  ? REPTSTATUS 05/07/2018 FINAL 05/05/2018  ? GRAMSTAIN  03/29/2009  ?  NO WBC SEEN ?NO SQUAMOUS EPITHELIAL CELLS SEEN ?NO ORGANISMS SEEN  ? GRAMSTAIN  03/29/2009  ?  NO WBC SEEN ?NO SQUAMOUS EPITHELIAL CELLS SEEN ?NO ORGANISMS SEEN  ? CULT (A) 05/05/2018  ?  <10,000 COLONIES/mL INSIGNIFICANT GROWTH ?Performed at [facility], [name], [place], [place] [address] ?  ? LABORGA STAPHYLOCOCCUS SPECIES (COAGULASE NEGATIVE) 11/21/2008  ? ? ? ?Lab Results  ?Component Value Date  ? ALBUMIN 4.6 01/16/2022  ? ALBUMIN 4.2 07/04/2021  ? ALBUMIN 4.0 12/08/2020  ? ? ?Lab Results  ?Component Value Date  ? MG 2.1 04/12/2016  ? MG 1.8 03/31/2009  ? MG 1.7 03/29/2009  ? ?Lab Results  ?Component Value Date  ? VD25OH 40.71 07/04/2021  ?  VD25OH 25.83 (L) 12/08/2020  ? VD25OH 45.00 06/15/2020  ? ? ?No results found for: PREALBUMIN ? ?  Latest Ref Rng & Units 01/16/2022  ?  2:15 PM 03/01/2021  ?  3:07 PM 03/01/2021  ?  2:00 PM  ?CBC EXTENDED  ?WBC 3.4 - 10.8 x10E3/uL 8.4   7.9   8.6    ?RBC 4.14 - 5.80 x10E6/uL 4.93   4.40   4.54    ?Hemoglobin 13.0 - 17.7 g/dL 15.8   14.2   14.5    ?HCT 37.5 - 51.0 % 45.5   42.8   43.8    ?Platelets 150 - 450 x10E3/uL 213   201   215.0    ?NEUT# 1.4 - 7.0 x10E3/uL 5.1    5.2    ?Lymph# 0.7 - 3.1 x10E3/uL 1.6    1.7    ? ? ? ?There is no height or weight on file to calculate BMI. ? ?Orders:  ?No orders of the defined types were placed in this encounter. ? ?No orders of the defined types were placed in this  encounter. ? ? ? Procedures: ?No procedures performed ? ?Clinical Data: ?No additional findings. ? ?ROS: ? ?All other systems negative, except as noted in the HPI. ?Review of Systems ? ?Objective: ?Vital Signs: There were no vitals taken for this visit. ? ?Specialty Comments:  ?No specialty comments available. ? ?PMFS History: ?Patient Active Problem List  ? Diagnosis Date Noted  ? Recurrent falls 08/25/2021  ? Memory difficulty 07/04/2021  ? Chronic toe ulcer, left, with fat layer exposed ([place]) 01/23/2021  ? Pituitary adenoma ([place]) 09/06/2020  ? Chronic intractable headache 07/27/2020  ? Vitamin B12 deficiency 03/31/2020  ? Vitamin D deficiency 03/31/2020  ? Numbness of foot 03/25/2020  ? Right hand pain 12/14/2019  ? Status post amputation of great toe, left ([place]) 12/14/2019  ? Callus of foot 04/02/2019  ? Allergy to bee sting 03/11/2019  ? Insomnia 02/10/2019  ? Status post lumbar spine operative procedure for decompression of spinal cord 12/12/2018  ? Dyslipidemia 10/19/2018  ? Foot deformity 10/19/2018  ? Spinal stenosis of lumbar region without neurogenic claudication 10/01/2018  ? Facial rash 05/26/2018  ? Status post reverse total replacement of right shoulder 05/13/2018  ? CHF (congestive heart failure) ([place]) 12/27/2017  ? OSA (obstructive sleep apnea) 12/27/2017  ? Health maintenance examination 11/25/2017  ? CKD (chronic kidney disease) stage 3, GFR 30-59 ml/min (HCC) 10/01/2017  ? Fatty liver 10/01/2017  ? Status post reverse total replacement of left shoulder 09/03/2017  ? Bilateral shoulder pain 05/24/2017  ? History of CVA in adulthood 03/08/2017  ? S/P total knee arthroplasty, left 01/30/2017  ? Arthritis of knee 01/15/2017  ? Skin rash 12/10/2016  ? Medicare annual wellness visit, subsequent 10/05/2016  ? Advanced care planning/counseling discussion 10/05/2016  ? Obesity, morbid, BMI 40.0-49.9 ([place]) 07/19/2016  ? Hypothyroidism   ? Iron deficiency 04/23/2016  ? Chronic pain syndrome 04/23/2016  ?  Inflammatory arthritis 04/23/2016  ? Chronic leg pain 04/23/2016  ? Lesion of palate 04/23/2016  ? Grieving 04/23/2016  ? GERD (gastroesophageal reflux disease)   ? Duodenitis determined by biopsy 02/13/2016  ? Diverticulosis of colon without hemorrhage 01/31/2016  ? Chest pain 01/11/2012  ? Migraine 01/11/2012  ? History of methicillin resistant staphylococcus aureus (MRSA) 07/04/2010  ? DDD (degenerative disc disease), lumbar 07/04/2010  ? History of total right knee replacement 07/04/2010  ? Primary osteoarthritis, right shoulder 06/09/2007  ? Personal history of colonic  adenoma 06/01/2003  ? ?Past Medical History:  ?Diagnosis Date  ? Acute kidney failure 08/2008  ? "cleared up"  no problems since  ? Allergy   ? Anxiety   ? Arthritis   ? Asthma   ? ?of this no inhaler  ? Blood transfusion   ? CHF (congestive heart failure) ([place])   ? ?of this, pt denies  ? CLOSTRIDIUM DIFFICILE COLITIS 07/04/2010  ? Annotation: 12/09, 2/10 Qualifier: Diagnosis of  By: [name], [name]    ? Depression with anxiety   ? Duodenitis determined by biopsy 02/2016  ? peptic likely due to aleve (erosive gastropathy with duodenal erosions)  ? ECZEMA 07/04/2010  ? Qualifier: Diagnosis of  By: [name], [name]    ? Elevated liver enzymes   ? Full dentures   ? GERD (gastroesophageal reflux disease)   ? Heart attack ([place])   ? 08/2008 (likely demand ischemia in the setting of MSSA sepsis/R TKA  infection)10-2008  ? History of hiatal hernia   ? History of stomach ulcers   ? Hypothyroidism   ? Lower GI bleeding   ? MALAR AND MAXILLARY BONES CLOSED FRACTURE 07/04/2010  ? Annotation: ORIF Qualifier: Diagnosis of  By: [name], [name]    ? Migraine   ? "definitely"  ? MRSA (methicillin resistant Staphylococcus aureus)   ? in leg, had to place steel rod in leg  ? Personal history of colonic adenoma 06/01/2003  ? PFO (patent foramen ovale)   ? small PFO by 08/2008 TEE  ? Pneumonia 08/2008  ? "while in ICU"  ? Seizures ([place]) 2009  ? "long time ago"    ?  Sleep apnea   ? does not wear CPAP  ? Stroke [facility]) 2010  ? unable to complete sentences at times  ? Wears glasses   ?  ?Family History  ?Problem Relation Age of Onset  ? Coronary artery disease Father 74  ? Stroke Fa

## 2022-01-17 NOTE — Telephone Encounter (Signed)
-----   Message from Suzzanne Cloud, NP sent at 01/17/2022  5:59 AM EDT ----- ?Please call, blood work is unremarkable with the exception of mildly elevated creatinine 1.38, has been the trend, this should be continued to be followed. He can continue with Depakote for headache prevention. Call for any problems.  ?

## 2022-01-22 ENCOUNTER — Encounter (HOSPITAL_COMMUNITY): Payer: Self-pay | Admitting: Orthopedic Surgery

## 2022-01-22 ENCOUNTER — Other Ambulatory Visit: Payer: Self-pay

## 2022-01-22 NOTE — Progress Notes (Signed)
Spoke with pt for pre-op call. Pt has hx of a stroke back in 2010 but states he does not have any lasting weakness or paralysis. Pt denies any recent chest pain or shortness of breath. Pt states he is not diabetic.  ? ?Shower instructions given to pt and he voiced understanding.  ?

## 2022-01-23 ENCOUNTER — Telehealth: Payer: Self-pay

## 2022-01-23 NOTE — Chronic Care Management (AMB) (Signed)
? ? ?Chronic Care Management ?Pharmacy Assistant  ? ?Name: JAEL KRAUSE  MRN: [id number] DOB: [birth year] ? ? ?Reason for Encounter: Medication Adherence and Delivery Coordination ?  ? ?Recent office visits:  ?None since last CCM contact ? ?Recent consult visits:  ?01/16/22-Orthopedic Surgery-[name],MD-Left foot ulcer follow up-schedule amputation  ?01/16/22-Neurology-Sarah Slack,NP-Migraine follow up.Labs ordered- -Restart Depakote ER 500 mg at bedtime for migraine prevention--Take Ubrelvy as needed for acute headache treatment, may combine with Zofran, tizanidine for significant headache ?12/28/21-Orthopedic Surgery-[name],MD-follow up left foot ulcer.No medication changes  ? ?Hospital visits:  ?None in previous 6 months ? ?Medications: ?Outpatient Encounter Medications as of 01/23/2022  ?Medication Sig Note  ? acetaminophen (TYLENOL) 500 MG tablet Take 1,000 mg by mouth 3 (three) times daily.   ? amitriptyline (ELAVIL) 50 MG tablet TAKE TWO TABLETS BY MOUTH EVERYDAY AT BEDTIME (Patient taking differently: Take 100 mg by mouth at bedtime.)   ? atorvastatin (LIPITOR) 20 MG tablet Take 1 tablet (20 mg total) by mouth daily.   ? B Complex Vitamins (B-COMPLEX/B-12 PO) Take 1 tablet by mouth daily.   ? Cholecalciferol (VITAMIN D3) 25 MCG (1000 UT) CAPS Take 1 capsule (1,000 Units total) by mouth daily.   ? clotrimazole (LOTRIMIN) 1 % cream Apply 1 application topically 2 (two) times daily. To groin (Patient taking differently: Apply 1 application. topically 2 (two) times daily as needed (rash).)   ? divalproex (DEPAKOTE ER) 500 MG 24 hr tablet Take 1 tablet (500 mg total) by mouth at bedtime.   ? EPINEPHrine 0.3 mg/0.3 mL IJ SOAJ injection Inject 0.3 mg into the muscle as needed for anaphylaxis. (bee stings)   ? ferrous sulfate 325 (65 FE) MG EC tablet Take 325 mg by mouth daily with breakfast.   ? furosemide (LASIX) 20 MG tablet TAKE ONE TABLET BY MOUTH EVERY MORNING (Patient taking differently: Take 20 mg by mouth  daily.)   ? gabapentin (NEURONTIN) 300 MG capsule TAKE ONE CAPSULE BY MOUTH EVERY MORNING and TAKE TWO CAPSULES AT NOON and TAKE TWO CAPSULES EVERY EVENING (Patient taking differently: Take 300 mg by mouth 3 (three) times daily.)   ? ketoconazole (NIZORAL) 2 % shampoo Apply 1 application topically 3 (three) times a week.   ? levothyroxine (SYNTHROID) 100 MCG tablet Take 1 tablet (100 mcg total) by mouth daily before breakfast.   ? metoprolol succinate (TOPROL-XL) 100 MG 24 hr tablet Take 1 tablet (100 mg total) by mouth daily. Take with or immediately following a meal.   ? nitroGLYCERIN (NITROSTAT) 0.4 MG SL tablet Place 1 tablet (0.4 mg total) under the tongue every 5 (five) minutes as needed for chest pain.   ? ondansetron (ZOFRAN ODT) 4 MG disintegrating tablet Take 1 tablet (4 mg total) by mouth every 8 (eight) hours as needed. (Patient taking differently: Take 4 mg by mouth every 8 (eight) hours as needed for nausea or vomiting.)   ? pantoprazole (PROTONIX) 40 MG tablet TAKE 1 TABLET BY MOUTH EVERY DAY (Patient taking differently: 40 mg daily.)   ? tiZANidine (ZANAFLEX) 4 MG tablet Take 1 tablet (4 mg total) by mouth every 6 (six) hours as needed (for acute headache treatment).   ? Ubrogepant (UBRELVY) 50 MG TABS Take 50 mg by mouth daily as needed. (Patient taking differently: Take 50 mg by mouth daily as needed.) 01/19/2022: New RX  ? ?No facility-administered encounter medications on file as of 01/23/2022.  ? ?BP Readings from Last 3 Encounters:  ?01/16/22 124/77  ?12/18/21 130/74  ?  07/04/21 120/70  ?  ?Lab Results  ?Component Value Date  ? HGBA1C 5.6 07/13/2010  ?  ? ? ?Recent OV, Consult or Hospital visit:  ?Recent medication changes indicated: 01/16/22-Neurology-Sarah Slack,NP-Migraine follow up.Labs ordered- -Restart Depakote ER 500 mg at bedtime for migraine prevention.-Take Ubrelvy as needed for acute headache treatment, may combine with Zofran, tizanidine for significant headache ? ? ?Last adherence  delivery date:01/04/22     ? ?Patient is due for next adherence delivery on: 02/02/22 ? ?Spoke with patient on 01/23/22 reviewed medications and coordinated delivery. ? ?This delivery to include: Adherence Packaging  30 Days  ?Packs: ?Gabapentin 300mg  - take 1 tablet breakfast, 2 tablets lunch,2 tablets evening meal ?Furosemide 20mg  - take 1 tablet daily breakfast  daily ?Pantoprazole 40mg  - take 1 tablet at breakfast ?Atorvastatin 20mg  - take 1 tablet at breakfast ?Vitamin D3 - take 1 capsule breakfast ?Ferrous sulfate 324 mg - take 1 tablet at breakfast  daily  ?Metoprolol succinate 100mg  - take 1 tablet at bedtime  ?Levothyroxine - take 1 tablet at breakfast ?Amitriptyline 50mg  - take 2 tablets at bedtime ? ?VIAL medications: ?Ubrelvy-50mg  use as needed ?Zofran 4mg - take 1 tablet as needed for nausea ?Tizanidine 4mg -take 1 tablet every 8 hours for headache as needed ? ? ?Patient declined the following medications this month: ?Nitrostat 0.4mg  SL tablets- supply on hand  ? ? ?Any concerns about your medications? No ? ?How often do you forget or accidentally miss a dose? Rarely ? ?Is patient in packaging Yes ? What is the date on your next pill pack? Unable to get to them to read date. ? Any concerns or issues with your packaging? Patient reports packs keep him up to date with medications ? ? ?No refill request needed. ? ?Confirmed delivery date of 02/02/22, advised patient that pharmacy will contact them the morning of delivery. ? ?Recent blood pressure readings are as follows:  127/73 per the patient  ? ?Annual wellness visit in last year? Yes ?Most Recent BP reading:124/77   ? ?Al Corpus, CPP notified ? ?Deano Tomaszewski, CCMA ?Health concierge  ?[phone]  ?

## 2022-01-24 ENCOUNTER — Ambulatory Visit (HOSPITAL_BASED_OUTPATIENT_CLINIC_OR_DEPARTMENT_OTHER): Payer: Medicare Other | Admitting: Certified Registered"

## 2022-01-24 ENCOUNTER — Encounter (HOSPITAL_COMMUNITY): Payer: Self-pay | Admitting: Orthopedic Surgery

## 2022-01-24 ENCOUNTER — Encounter (HOSPITAL_COMMUNITY)
Admission: RE | Disposition: A | Discharge status: Home / Self Care | Payer: Self-pay | Source: Home / Self Care | Attending: Orthopedic Surgery

## 2022-01-24 ENCOUNTER — Ambulatory Visit (HOSPITAL_COMMUNITY)
Admission: RE | Admit: 2022-01-24 | Discharge: 2022-01-24 | Disposition: A | Discharge status: Home / Self Care | Payer: Medicare Other | Attending: Orthopedic Surgery | Admitting: Orthopedic Surgery

## 2022-01-24 ENCOUNTER — Ambulatory Visit (HOSPITAL_COMMUNITY): Payer: Medicare Other | Admitting: Certified Registered"

## 2022-01-24 ENCOUNTER — Other Ambulatory Visit: Payer: Self-pay

## 2022-01-24 DIAGNOSIS — Z823 Family history of stroke: Secondary | ICD-10-CM | POA: Insufficient documentation

## 2022-01-24 DIAGNOSIS — M1990 Unspecified osteoarthritis, unspecified site: Secondary | ICD-10-CM | POA: Diagnosis not present

## 2022-01-24 DIAGNOSIS — E039 Hypothyroidism, unspecified: Secondary | ICD-10-CM | POA: Diagnosis not present

## 2022-01-24 DIAGNOSIS — T8149XA Infection following a procedure, other surgical site, initial encounter: Secondary | ICD-10-CM | POA: Diagnosis not present

## 2022-01-24 DIAGNOSIS — K219 Gastro-esophageal reflux disease without esophagitis: Secondary | ICD-10-CM | POA: Insufficient documentation

## 2022-01-24 DIAGNOSIS — L97522 Non-pressure chronic ulcer of other part of left foot with fat layer exposed: Secondary | ICD-10-CM

## 2022-01-24 DIAGNOSIS — G4730 Sleep apnea, unspecified: Secondary | ICD-10-CM | POA: Diagnosis not present

## 2022-01-24 DIAGNOSIS — L03032 Cellulitis of left toe: Secondary | ICD-10-CM | POA: Diagnosis not present

## 2022-01-24 DIAGNOSIS — I252 Old myocardial infarction: Secondary | ICD-10-CM

## 2022-01-24 DIAGNOSIS — Z8673 Personal history of transient ischemic attack (TIA), and cerebral infarction without residual deficits: Secondary | ICD-10-CM | POA: Insufficient documentation

## 2022-01-24 DIAGNOSIS — L089 Local infection of the skin and subcutaneous tissue, unspecified: Secondary | ICD-10-CM | POA: Insufficient documentation

## 2022-01-24 DIAGNOSIS — R569 Unspecified convulsions: Secondary | ICD-10-CM | POA: Insufficient documentation

## 2022-01-24 DIAGNOSIS — Z8249 Family history of ischemic heart disease and other diseases of the circulatory system: Secondary | ICD-10-CM | POA: Insufficient documentation

## 2022-01-24 DIAGNOSIS — F32A Depression, unspecified: Secondary | ICD-10-CM | POA: Insufficient documentation

## 2022-01-24 DIAGNOSIS — K449 Diaphragmatic hernia without obstruction or gangrene: Secondary | ICD-10-CM | POA: Insufficient documentation

## 2022-01-24 DIAGNOSIS — J45909 Unspecified asthma, uncomplicated: Secondary | ICD-10-CM | POA: Insufficient documentation

## 2022-01-24 HISTORY — PX: AMPUTATION: SHX166

## 2022-01-24 HISTORY — DX: Fatty (change of) liver, not elsewhere classified: K76.0

## 2022-01-24 LAB — SURGICAL PCR SCREEN
MRSA, PCR: NEGATIVE
Staphylococcus aureus: NEGATIVE

## 2022-01-24 SURGERY — AMPUTATION DIGIT
Anesthesia: General | Site: Toe | Laterality: Left

## 2022-01-24 MED ORDER — PROPOFOL 10 MG/ML IV BOLUS
INTRAVENOUS | Status: AC
  Filled 2022-01-24: qty 20

## 2022-01-24 MED ORDER — ORAL CARE MOUTH RINSE: 15.0000 mL | Freq: Once | OROMUCOSAL | Status: AC

## 2022-01-24 MED ORDER — BUPIVACAINE HCL (PF) 0.25 % IJ SOLN
INTRAMUSCULAR | Status: DC | PRN
  Administered 2022-01-24: 14 mL

## 2022-01-24 MED ORDER — PHENYLEPHRINE 40 MCG/ML (10ML) SYRINGE FOR IV PUSH (FOR BLOOD PRESSURE SUPPORT)
PREFILLED_SYRINGE | INTRAVENOUS | Status: DC | PRN
  Administered 2022-01-24 (×2): 100 ug via INTRAVENOUS

## 2022-01-24 MED ORDER — BUPIVACAINE HCL (PF) 0.25 % IJ SOLN
INTRAMUSCULAR | Status: AC
  Filled 2022-01-24: qty 30

## 2022-01-24 MED ORDER — 0.9 % SODIUM CHLORIDE (POUR BTL) OPTIME
TOPICAL | Status: DC | PRN
  Administered 2022-01-24: 1000 mL

## 2022-01-24 MED ORDER — OXYCODONE-ACETAMINOPHEN 5-325 MG PO TABS: 1.0000 | ORAL_TABLET | ORAL | 0 refills | Status: DC | PRN

## 2022-01-24 MED ORDER — CHLORHEXIDINE GLUCONATE 0.12 % MT SOLN
15.0000 mL | Freq: Once | OROMUCOSAL | Status: AC
  Administered 2022-01-24: 15 mL via OROMUCOSAL
  Filled 2022-01-24: qty 15

## 2022-01-24 MED ORDER — LIDOCAINE 2% (20 MG/ML) 5 ML SYRINGE
INTRAMUSCULAR | Status: DC | PRN
  Administered 2022-01-24: 100 mg via INTRAVENOUS

## 2022-01-24 MED ORDER — EPHEDRINE 5 MG/ML INJ
INTRAVENOUS | Status: AC
  Filled 2022-01-24: qty 5

## 2022-01-24 MED ORDER — PROPOFOL 10 MG/ML IV BOLUS
INTRAVENOUS | Status: DC | PRN
  Administered 2022-01-24: 150 mg via INTRAVENOUS
  Administered 2022-01-24: 50 mg via INTRAVENOUS

## 2022-01-24 MED ORDER — PROMETHAZINE HCL 25 MG/ML IJ SOLN: 6.2500 mg | INTRAMUSCULAR | Status: DC | PRN

## 2022-01-24 MED ORDER — CEFAZOLIN IN SODIUM CHLORIDE 3-0.9 GM/100ML-% IV SOLN
3.0000 g | INTRAVENOUS | Status: AC
  Administered 2022-01-24: 3 g via INTRAVENOUS
  Filled 2022-01-24: qty 100

## 2022-01-24 MED ORDER — AMISULPRIDE (ANTIEMETIC) 5 MG/2ML IV SOLN: 10.0000 mg | Freq: Once | INTRAVENOUS | Status: DC | PRN

## 2022-01-24 MED ORDER — ONDANSETRON HCL 4 MG/2ML IJ SOLN
INTRAMUSCULAR | Status: DC | PRN
Start: 2022-01-24 — End: 2022-01-24
  Administered 2022-01-24: 4 mg via INTRAVENOUS

## 2022-01-24 MED ORDER — PHENYLEPHRINE 40 MCG/ML (10ML) SYRINGE FOR IV PUSH (FOR BLOOD PRESSURE SUPPORT)
PREFILLED_SYRINGE | INTRAVENOUS | Status: AC
  Filled 2022-01-24: qty 10

## 2022-01-24 MED ORDER — LIDOCAINE 2% (20 MG/ML) 5 ML SYRINGE
INTRAMUSCULAR | Status: AC
  Filled 2022-01-24: qty 5

## 2022-01-24 MED ORDER — HYDROMORPHONE HCL 1 MG/ML IJ SOLN: 0.2500 mg | INTRAMUSCULAR | Status: DC | PRN

## 2022-01-24 MED ORDER — LACTATED RINGERS IV SOLN: INTRAVENOUS | Status: DC

## 2022-01-24 MED ORDER — ONDANSETRON HCL 4 MG/2ML IJ SOLN
INTRAMUSCULAR | Status: AC
  Filled 2022-01-24: qty 2

## 2022-01-24 MED ORDER — EPHEDRINE SULFATE-NACL 50-0.9 MG/10ML-% IV SOSY
PREFILLED_SYRINGE | INTRAVENOUS | Status: DC | PRN
  Administered 2022-01-24 (×2): 5 mg via INTRAVENOUS
  Administered 2022-01-24: 10 mg via INTRAVENOUS
  Administered 2022-01-24: 5 mg via INTRAVENOUS

## 2022-01-24 SURGICAL SUPPLY — 31 items
BAG COUNTER SPONGE SURGICOUNT (BAG) ×2 IMPLANT
BAG SPNG CNTER NS LX DISP (BAG) ×1
BLADE SURG 21 STRL SS (BLADE) ×2 IMPLANT
BNDG CMPR 9X4 STRL LF SNTH (GAUZE/BANDAGES/DRESSINGS)
BNDG COHESIVE 4X5 TAN ST LF (GAUZE/BANDAGES/DRESSINGS) ×1 IMPLANT
BNDG COHESIVE 4X5 TAN STRL (GAUZE/BANDAGES/DRESSINGS) ×2 IMPLANT
BNDG ESMARK 4X9 LF (GAUZE/BANDAGES/DRESSINGS) IMPLANT
BNDG GAUZE ELAST 4 BULKY (GAUZE/BANDAGES/DRESSINGS) ×2 IMPLANT
COVER SURGICAL LIGHT HANDLE (MISCELLANEOUS) ×4 IMPLANT
DRAPE U-SHAPE 47X51 STRL (DRAPES) ×2 IMPLANT
DRSG ADAPTIC 3X8 NADH LF (GAUZE/BANDAGES/DRESSINGS) ×1 IMPLANT
DRSG PAD ABDOMINAL 8X10 ST (GAUZE/BANDAGES/DRESSINGS) ×2 IMPLANT
DURAPREP 26ML APPLICATOR (WOUND CARE) ×2 IMPLANT
ELECT REM PT RETURN 9FT ADLT (ELECTROSURGICAL) ×2
ELECTRODE REM PT RTRN 9FT ADLT (ELECTROSURGICAL) ×1 IMPLANT
GAUZE SPONGE 4X4 12PLY STRL (GAUZE/BANDAGES/DRESSINGS) ×1 IMPLANT
GLOVE BIOGEL PI IND STRL 9 (GLOVE) ×1 IMPLANT
GLOVE BIOGEL PI INDICATOR 9 (GLOVE) ×1
GLOVE SURG ORTHO 9.0 STRL STRW (GLOVE) ×2 IMPLANT
GOWN STRL REUS W/ TWL XL LVL3 (GOWN DISPOSABLE) ×2 IMPLANT
GOWN STRL REUS W/TWL XL LVL3 (GOWN DISPOSABLE) ×4
KIT BASIN OR (CUSTOM PROCEDURE TRAY) ×2 IMPLANT
KIT TURNOVER KIT B (KITS) ×2 IMPLANT
MANIFOLD NEPTUNE II (INSTRUMENTS) ×2 IMPLANT
NEEDLE 22X1 1/2 (OR ONLY) (NEEDLE) IMPLANT
NS IRRIG 1000ML POUR BTL (IV SOLUTION) ×2 IMPLANT
PACK ORTHO EXTREMITY (CUSTOM PROCEDURE TRAY) ×2 IMPLANT
PAD ARMBOARD 7.5X6 YLW CONV (MISCELLANEOUS) ×4 IMPLANT
SUT ETHILON 2 0 PSLX (SUTURE) ×2 IMPLANT
SYR CONTROL 10ML LL (SYRINGE) IMPLANT
TOWEL GREEN STERILE (TOWEL DISPOSABLE) ×2 IMPLANT

## 2022-01-24 NOTE — Transfer of Care (Signed)
Immediate Anesthesia Transfer of Care Note ? ?Patient: MURPHY DUZAN ? ?Procedure(s) Performed: LEFT GREAT TOE AMPUTATION AT METATARSOPHALANGEAL JOINT AND SECOND TOE AMPUTATION (Left: Toe) ? ?Patient Location: PACU ? ?Anesthesia Type:General ? ?Level of Consciousness: awake, drowsy and patient cooperative ? ?Airway & Oxygen Therapy: Patient Spontanous Breathing and Patient connected to face mask oxygen ? ?Post-op Assessment: Report given to RN, Post -op Vital signs reviewed and stable and Patient moving all extremities X 4 ? ?Post vital signs: Reviewed and stable ? ?Last Vitals:  ?Vitals Value Taken Time  ?BP 132/70 01/24/22 0900  ?Temp    ?Pulse 54 01/24/22 0901  ?Resp 20 01/24/22 0901  ?SpO2 100 % 01/24/22 0901  ?Vitals shown include unvalidated device data. ? ?Last Pain:  ?Vitals:  ? 01/24/22 0637  ?TempSrc: Oral  ?   ? ?  ? ?Complications: No notable events documented. ?

## 2022-01-24 NOTE — Progress Notes (Signed)
Patient resting quietly with eyes closed awaiting son to transport home skin warm and dry respirations even unlabored ?

## 2022-01-24 NOTE — Anesthesia Procedure Notes (Signed)
Procedure Name: LMA Insertion ?Date/Time: 01/24/2022 8:37 AM ?Performed by: Annamary Carolin, CRNA ?Pre-anesthesia Checklist: Patient identified, Emergency Drugs available, Suction available and Patient being monitored ?Patient Re-evaluated:Patient Re-evaluated prior to induction ?Oxygen Delivery Method: Circle System Utilized ?Preoxygenation: Pre-oxygenation with 100% oxygen ?Induction Type: IV induction ?Ventilation: Mask ventilation without difficulty ?LMA: LMA inserted ?LMA Size: 5.0 ?Number of attempts: 1 ?Airway Equipment and Method: Bite block ?Placement Confirmation: positive ETCO2 ?Tube secured with: Tape ?Dental Injury: Teeth and Oropharynx as per pre-operative assessment  ?Comments: With ease ? ? ? ? ?

## 2022-01-24 NOTE — Anesthesia Preprocedure Evaluation (Signed)
Anesthesia Evaluation  ?Patient identified by MRN, date of birth, ID band ?Patient awake ? ? ? ?Reviewed: ?Allergy & Precautions, NPO status , Patient's Chart, lab work & pertinent test results ? ?History of Anesthesia Complications ?Negative for: history of anesthetic complications ? ?Airway ?Mallampati: II ? ?TM Distance: >3 FB ?Neck ROM: Full ? ? ? Dental ? ?(+) Dental Advisory Given, Edentulous Upper, Edentulous Lower ?  ?Pulmonary ?asthma , sleep apnea ,  ?  ?Pulmonary exam normal ?breath sounds clear to auscultation ? ? ? ? ? ? Cardiovascular ?+ Past MI (NSTEMI 2009 in setting of sepsis)  ?Normal cardiovascular exam ?Rhythm:Regular Rate:Normal ? ?TTE 2019: EF 55%, mild AR, MR, TR ?  ?Neuro/Psych ? Headaches, Seizures - (last seizure in 20s), Well Controlled,  Anxiety Depression CVA (2010), No Residual Symptoms   ? GI/Hepatic ?Neg liver ROS, hiatal hernia, GERD  ,  ?Endo/Other  ?Hypothyroidism  ? Renal/GU ?Renal InsufficiencyRenal disease  ? ?  ?Musculoskeletal ? ?(+) Arthritis ,  ? Abdominal ?(+) + obese,   ?Peds ? Hematology ?negative hematology ROS ?(+)   ?Anesthesia Other Findings ?Day of surgery medications reviewed with the patient. ? Reproductive/Obstetrics ? ?  ? ? ? ? ? ? ? ? ? ? ? ? ? ?  ?  ? ? ? ? ? ? ? ? ?Anesthesia Physical ? ?Anesthesia Plan ? ?ASA: 3 ? ?Anesthesia Plan: General  ? ?Post-op Pain Management:   ? ?Induction: Intravenous ? ?PONV Risk Score and Plan: 2 and Treatment may vary due to age or medical condition, Ondansetron and Midazolam ? ?Airway Management Planned: LMA ? ?Additional Equipment:  ? ?Intra-op Plan:  ? ?Post-operative Plan: Extubation in OR ? ?Informed Consent: I have reviewed the patients History and Physical, chart, labs and discussed the procedure including the risks, benefits and alternatives for the proposed anesthesia with the patient or authorized representative who has indicated his/her understanding and acceptance.  ? ? ? ?Dental  advisory given ? ?Plan Discussed with: CRNA ? ?Anesthesia Plan Comments:   ? ? ? ? ? ? ?Anesthesia Quick Evaluation ? ?

## 2022-01-24 NOTE — Op Note (Signed)
01/24/2022 ? ?8:59 AM ? ?PATIENT:  Nicholas [name]   ? ?PRE-OPERATIVE DIAGNOSIS:  Infected Left Great Toe ? ?POST-OPERATIVE DIAGNOSIS:  Same ? ?PROCEDURE:  LEFT GREAT TOE AMPUTATION AT METATARSOPHALANGEAL JOINT AND SECOND TOE AMPUTATION ? ?SURGEON:  Newt Minion, MD ? ?PHYSICIAN ASSISTANT:None ?ANESTHESIA:   General ? ?PREOPERATIVE INDICATIONS:  Nicholas [name] is a  71 y.o. male with a diagnosis of Infected Left Great Toe who failed conservative measures and elected for surgical management.   ? ?The risks benefits and alternatives were discussed with the patient preoperatively including but not limited to the risks of infection, bleeding, nerve injury, cardiopulmonary complications, the need for revision surgery, among others, and the patient was willing to proceed. ? ?OPERATIVE IMPLANTS: None ? ?'@ENCIMAGES'$ @ ? ?OPERATIVE FINDINGS: Minimal petechial bleeding. ? ?OPERATIVE PROCEDURE: Patient was brought the operating room and underwent a general anesthetic.  After adequate levels anesthesia were obtained patient's left lower extremity was prepped using DuraPrep draped into a sterile field a timeout was called.  A elliptical incision was made around the residual proximal phalanx great toe and second toe.  The great toe and second toe were amputated through the MTP joint.  There was minimal petechial bleeding.  The wound was irrigated with normal saline incision closed using 2-0 nylon a sterile dressing was applied patient was extubated taken the PACU in stable condition.  Patient underwent regional block with 20 cc of quarter percent Marcaine plain. ? ? ?DISCHARGE PLANNING: ? ?Antibiotic duration: Preoperative antibiotics ? ?Weightbearing: Touchdown weightbearing ? ?Pain medication: Prescription for Percocet patient states he has taken Percocet in the past without problems. ? ?Dressing care/ Wound VAC: Follow-up in the office 1 week to change the dressing ? ?Ambulatory devices: Crutches ? ?Discharge to:  Home. ? ?Follow-up: In the office 1 week post operative. ? ? ? ? ? ? ?  ?

## 2022-01-24 NOTE — Anesthesia Postprocedure Evaluation (Signed)
Anesthesia Post Note ? ?Patient: Nicholas [name] ? ?Procedure(s) Performed: LEFT GREAT TOE AMPUTATION AT METATARSOPHALANGEAL JOINT AND SECOND TOE AMPUTATION (Left: Toe) ? ?  ? ?Patient location during evaluation: PACU ?Anesthesia Type: General ?Level of consciousness: awake and alert ?Pain management: pain level controlled ?Vital Signs Assessment: post-procedure vital signs reviewed and stable ?Respiratory status: spontaneous breathing, nonlabored ventilation and respiratory function stable ?Cardiovascular status: blood pressure returned to baseline and stable ?Postop Assessment: no apparent nausea or vomiting ?Anesthetic complications: no ? ? ?No notable events documented. ? ?Last Vitals:  ?Vitals:  ? 01/24/22 0915 01/24/22 0930  ?BP: 115/71 119/70  ?Pulse: (!) 58 (!) 53  ?Resp: 20 18  ?Temp:    ?SpO2: 96% 93%  ?  ?Last Pain:  ?Vitals:  ? 01/24/22 0930  ?TempSrc:   ?PainSc: Asleep  ? ? ?  ?  ?  ?  ?  ?  ? ?Lynda Rainwater ? ? ? ? ?

## 2022-01-24 NOTE — H&P (Signed)
Nicholas [name] is an 71 y.o. male.   ?Chief Complaint: Chronic ulcer left great toe status post partial great toe amputation. ?HPI: Patient is a 70 year old gentleman who has had partial amputation of the left great toe.  Patient has had recurrent ulcerations over the residual toe and is concerned for chronic bone infection.  Patient has chronic pain from this site. ? ?Past Medical History:  ?Diagnosis Date  ? Acute kidney failure 08/2008  ? "cleared up"  no problems since  ? Allergy   ? Anxiety   ? Arthritis   ? Asthma   ? ?of this no inhaler  ? Blood transfusion   ? CHF (congestive heart failure) ([place])   ? ?of this, pt denies  ? CLOSTRIDIUM DIFFICILE COLITIS 07/04/2010  ? Annotation: 12/09, 2/10 Qualifier: Diagnosis of  By: [name], [name]    ? Depression with anxiety   ? Duodenitis determined by biopsy 02/2016  ? peptic likely due to aleve (erosive gastropathy with duodenal erosions)  ? ECZEMA 07/04/2010  ? Qualifier: Diagnosis of  By: [name], [name]    ? Elevated liver enzymes   ? Fatty liver   ? Full dentures   ? GERD (gastroesophageal reflux disease)   ? Heart attack ([place])   ? 08/2008 (likely demand ischemia in the setting of MSSA sepsis/R TKA  infection)10-2008  ? History of hiatal hernia   ? History of stomach ulcers   ? Hypothyroidism   ? Lower GI bleeding   ? MALAR AND MAXILLARY BONES CLOSED FRACTURE 07/04/2010  ? Annotation: ORIF Qualifier: Diagnosis of  By: [name], [name]    ? Migraine   ? "definitely"  ? MRSA (methicillin resistant Staphylococcus aureus)   ? in leg, had to place steel rod in leg  ? Personal history of colonic adenoma 06/01/2003  ? PFO (patent foramen ovale)   ? small PFO by 08/2008 TEE  ? Pneumonia 08/2008  ? "while in ICU"  ? Seizures ([place]) 2009  ? "long time ago"    ? Sleep apnea   ? does not wear CPAP  ? Stroke [facility]) 2010  ? unable to complete sentences at times  ? Wears glasses   ? ? ?Past Surgical History:  ?Procedure Laterality Date  ? anterior  nerve transposition  07/2009   ? left ulnar nerve  ? antibiotic spacer exchange  11/2008; 08/2006  ? right knee  ? ARTHROTOMY  08/2008  ? right knee w/I&D  ? CARDIOVASCULAR STRESS TEST  2013  ? stress EKG - negative for ischemia, 4 min 7.3 METs, normal blood pressure response  ? CATARACT EXTRACTION    ? COLONOSCOPY  11/2012  ? diverticulosis, rpt 10 yrs [name])  ? ESOPHAGOGASTRODUODENOSCOPY  02/2016  ? erosive gastropathy with duodenal erosions [name])  ? HARDWARE REMOVAL  04/2006  ? right knee w/antibiotic spacers placed  ? INGUINAL HERNIA REPAIR  early 1990's  ? bilateral  ? JOINT REPLACEMENT Bilateral   ? KNEE ARTHROSCOPY  10/2001  ? right  ? KNEE FUSION  03/2009  ? right knee removal; antibiotic spacers;   ? LEFT HEART CATH AND CORONARY ANGIOGRAPHY N/A 02/04/2018  ?  WNL (Patwardhan, Reynold Bowen, MD)  ? LUMBAR LAMINECTOMY/DECOMPRESSION MICRODISCECTOMY N/A 12/05/2018  ? Procedure: LUMBAR THREE TO FOUR AND LUMBAR FOUR TO FIVE DECOMPRESSION;  Surgeon: [name], MD;  Location: [place];  Service: Orthopedics;  Laterality: N/A;  ? MULTIPLE TOOTH EXTRACTIONS    ? REPLACEMENT TOTAL KNEE  08/2008; 09/2001  ?  right  ? REVERSE SHOULDER ARTHROPLASTY Left 09/03/2017  ? Procedure: REVERSE SHOULDER ARTHROPLASTY;  Surgeon: Meredith Pel, MD;  Location: [place];  Service: Orthopedics;  Laterality: Left;  ? REVERSE SHOULDER ARTHROPLASTY Right 05/13/2018  ? Procedure: RIGHT REVERSE SHOULDER ARTHROPLASTY;  Surgeon: Meredith Pel, MD;  Location: [place];  Service: Orthopedics;  Laterality: Right;  ? rod placement Right 03/2009  ? R knee  ? SYNOVECTOMY  06/2005  ? debridement, liner exchange right knee  ? TOE AMPUTATION Left 08/2008  ? great toe - osteomyelitis (staph infection)  ? TOE AMPUTATION Left 04/2016  ? 2nd toe [name]  ? TONSILLECTOMY    ? TOTAL KNEE ARTHROPLASTY Left 01/15/2017  ? TOTAL KNEE ARTHROPLASTY Left 01/15/2017  ? Procedure: TOTAL KNEE ARTHROPLASTY;  Surgeon: Meredith Pel, MD;  Location: [place];  Service: Orthopedics;  Laterality: Left;   ? ? ?Family History  ?Problem Relation Age of Onset  ? Coronary artery disease Father 19  ? Stroke Father   ? CAD Mother 60  ?     stents  ? Breast cancer Sister   ? Diabetes Brother   ? Diabetes Sister   ? Colon cancer Neg Hx   ? Stomach cancer Neg Hx   ? ?Social History:  reports that he has never smoked. He has been exposed to tobacco smoke. He has never used smokeless tobacco. He reports that he does not drink alcohol and does not use drugs. ? ?Allergies:  ?Allergies  ?Allergen Reactions  ? Peanut-Containing Drug Products Anaphylaxis and Dermatitis  ? [place]et Venom [Bee Venom] Anaphylaxis and Other (See Comments)  ?  Respiratory Distress  ? Celebrex [Celecoxib] Other (See Comments)  ?  BLACK STOOL?MELENA?  ? Percocet [Oxycodone-Acetaminophen] Hives, Swelling and Other (See Comments)  ? Latex Rash  ? Nsaids Rash  ? ? ?Medications Prior to Admission  ?Medication Sig Dispense Refill  ? acetaminophen (TYLENOL) 500 MG tablet Take 1,000 mg by mouth 3 (three) times daily.    ? amitriptyline (ELAVIL) 50 MG tablet TAKE TWO TABLETS BY MOUTH EVERYDAY AT BEDTIME (Patient taking differently: Take 100 mg by mouth at bedtime.) 180 tablet 2  ? atorvastatin (LIPITOR) 20 MG tablet Take 1 tablet (20 mg total) by mouth daily. 90 tablet 3  ? B Complex Vitamins (B-COMPLEX/B-12 PO) Take 1 tablet by mouth daily.    ? Cholecalciferol (VITAMIN D3) 25 MCG (1000 UT) CAPS Take 1 capsule (1,000 Units total) by mouth daily. 90 capsule 1  ? clotrimazole (LOTRIMIN) 1 % cream Apply 1 application topically 2 (two) times daily. To groin (Patient taking differently: Apply 1 application. topically 2 (two) times daily as needed (rash).) 60 g 1  ? divalproex (DEPAKOTE ER) 500 MG 24 hr tablet Take 1 tablet (500 mg total) by mouth at bedtime. 30 tablet 5  ? EPINEPHrine 0.3 mg/0.3 mL IJ SOAJ injection Inject 0.3 mg into the muscle as needed for anaphylaxis. (bee stings) 2 each 1  ? ferrous sulfate 325 (65 FE) MG EC tablet Take 325 mg by mouth  daily with breakfast.    ? furosemide (LASIX) 20 MG tablet TAKE ONE TABLET BY MOUTH EVERY MORNING (Patient taking differently: Take 20 mg by mouth daily.) 90 tablet 1  ? gabapentin (NEURONTIN) 300 MG capsule TAKE ONE CAPSULE BY MOUTH EVERY MORNING and TAKE TWO CAPSULES AT NOON and TAKE TWO CAPSULES EVERY EVENING (Patient taking differently: Take 300 mg by mouth 3 (three) times daily.) 450 capsule 1  ? ketoconazole (NIZORAL)  2 % shampoo Apply 1 application topically 3 (three) times a week. 120 mL 3  ? levothyroxine (SYNTHROID) 100 MCG tablet Take 1 tablet (100 mcg total) by mouth daily before breakfast. 90 tablet 3  ? metoprolol succinate (TOPROL-XL) 100 MG 24 hr tablet Take 1 tablet (100 mg total) by mouth daily. Take with or immediately following a meal. 90 tablet 3  ? nitroGLYCERIN (NITROSTAT) 0.4 MG SL tablet Place 1 tablet (0.4 mg total) under the tongue every 5 (five) minutes as needed for chest pain. 20 tablet 3  ? ondansetron (ZOFRAN ODT) 4 MG disintegrating tablet Take 1 tablet (4 mg total) by mouth every 8 (eight) hours as needed. (Patient taking differently: Take 4 mg by mouth every 8 (eight) hours as needed for nausea or vomiting.) 20 tablet 3  ? pantoprazole (PROTONIX) 40 MG tablet TAKE 1 TABLET BY MOUTH EVERY DAY (Patient taking differently: 40 mg daily.) 90 tablet 3  ? tiZANidine (ZANAFLEX) 4 MG tablet Take 1 tablet (4 mg total) by mouth every 6 (six) hours as needed (for acute headache treatment). 20 tablet 1  ? Ubrogepant (UBRELVY) 50 MG TABS Take 50 mg by mouth daily as needed. (Patient taking differently: Take 50 mg by mouth daily as needed.) 12 tablet 11  ? ? ?No results found for this or any previous visit (from the past 48 hour(s)). ?No results found. ? ?Review of Systems  ?All other systems reviewed and are negative. ? ?There were no vitals taken for this visit. ?Physical Exam  ?Examination patient is alert oriented no adenopathy well-dressed normal affect normal respiratory effort.  Patient  has a palpable dorsalis pedis pulse.  Patient has pain over the residual left great toe with ulceration.  There is no ascending cellulitis.  The metatarsal head is not tender to palpation. ?Assessment/Plan ?

## 2022-01-24 NOTE — Progress Notes (Signed)
Orthopedic Tech Progress Note ?Patient Details:  ?Nicholas [name] ?08-03-1951 ?[id number] ? ?PACU RN called requesting a POST OP SHOE for patient  ? ?Ortho Devices ?Type of Ortho Device: Postop shoe/boot ?Ortho Device/Splint Location: LLE ?Ortho Device/Splint Interventions: Ordered, Application ?  ?Post Interventions ?Patient Tolerated: Well ?Instructions Provided: Care of device ? ?Janit Pagan ?01/24/2022, 9:27 AM ? ?

## 2022-01-25 ENCOUNTER — Ambulatory Visit (INDEPENDENT_AMBULATORY_CARE_PROVIDER_SITE_OTHER): Payer: Medicare Other | Admitting: Orthopedic Surgery

## 2022-01-25 ENCOUNTER — Encounter (HOSPITAL_COMMUNITY): Payer: Self-pay | Admitting: Orthopedic Surgery

## 2022-01-25 DIAGNOSIS — Z89412 Acquired absence of left great toe: Secondary | ICD-10-CM

## 2022-01-26 ENCOUNTER — Other Ambulatory Visit: Payer: Self-pay | Admitting: Family Medicine

## 2022-01-26 DIAGNOSIS — K269 Duodenal ulcer, unspecified as acute or chronic, without hemorrhage or perforation: Secondary | ICD-10-CM

## 2022-01-26 DIAGNOSIS — K2960 Other gastritis without bleeding: Secondary | ICD-10-CM

## 2022-01-30 ENCOUNTER — Encounter: Payer: Self-pay | Admitting: Orthopedic Surgery

## 2022-01-30 NOTE — Progress Notes (Signed)
? ?Office Visit Note ?  ?Patient: Nicholas [name]           ?Date of Birth: 25-Dec-1950           ?MRN: [id number] ?Visit Date: 01/25/2022 ?             ?Requested by: [name], MD ?[address] ?[place],  [place] [address] ?PCP: [name], MD ? ?Chief Complaint  ?Patient presents with  ? Left Foot - Routine Post Op  ?  01/24/22 left GT amputation at [facility]TPJ and 2nd toe amputation  ? ? ? ? ?HPI: ?Patient is a 71 year old gentleman who is seen status post left great toe amputation.  Patient complains of increased swelling. ? ?Assessment & Plan: ?Visit Diagnoses:  ?1. Left great toe amputee ([place])   ? ? ?Plan: Patient will start Dial soap cleansing elevate his leg minimize weightbearing recommended food with his pain medicine since he is having nausea with the narcotics. ? ?Follow-Up Instructions: Return in about 1 week (around 02/01/2022).  ? ?Ortho Exam ? ?Patient is alert, oriented, no adenopathy, well-dressed, normal affect, normal respiratory effort. ?Examination the incision is swelling with gaping of the incision.  There are no ischemic changes there is no cellulitis no odor no drainage. ? ?Imaging: ?No results found. ?No images are attached to the encounter. ? ?Labs: ?Lab Results  ?Component Value Date  ? HGBA1C 5.6 07/13/2010  ? HGBA1C  12/08/2008  ?  5.2 ?(NOTE)   The ADA recommends the following therapeutic goal for glycemic   control related to Hgb A1C measurement:   Goal of Therapy:   < 7.0% Hgb A1C   Reference: American Diabetes Association: Clinical Practice   Recommendations 2008, Diabetes Care,  ?2008, 31:(Suppl 1).  ? HGBA1C  11/23/2008  ?  5.1 ?(NOTE)   The ADA recommends the following therapeutic goal for glycemic   control related to Hgb A1C measurement:   Goal of Therapy:   < 7.0% Hgb A1C   Reference: American Diabetes Association: Clinical Practice   Recommendations 2008, Diabetes Care,  ?2008, 31:(Suppl 1).  ? ESRSEDRATE 28 (H) 03/01/2021  ? ESRSEDRATE 18 08/03/2020  ? ESRSEDRATE 22  (H) 01/11/2012  ? LABURIC 8.8 (H) 03/01/2021  ? LABURIC 5.0 02/08/2016  ? REPTSTATUS 05/07/2018 FINAL 05/05/2018  ? GRAMSTAIN  03/29/2009  ?  NO WBC SEEN ?NO SQUAMOUS EPITHELIAL CELLS SEEN ?NO ORGANISMS SEEN  ? GRAMSTAIN  03/29/2009  ?  NO WBC SEEN ?NO SQUAMOUS EPITHELIAL CELLS SEEN ?NO ORGANISMS SEEN  ? CULT (A) 05/05/2018  ?  <10,000 COLONIES/mL INSIGNIFICANT GROWTH ?Performed at [facility], [place] [address] [place], [place] [address] ?  ? LABORGA STAPHYLOCOCCUS SPECIES (COAGULASE NEGATIVE) 11/21/2008  ? ? ? ?Lab Results  ?Component Value Date  ? ALBUMIN 4.6 01/16/2022  ? ALBUMIN 4.2 07/04/2021  ? ALBUMIN 4.0 12/08/2020  ? ? ?Lab Results  ?Component Value Date  ? MG 2.1 04/12/2016  ? MG 1.8 03/31/2009  ? MG 1.7 03/29/2009  ? ?Lab Results  ?Component Value Date  ? VD25OH 40.71 07/04/2021  ? VD25OH 25.83 (L) 12/08/2020  ? VD25OH 45.00 06/15/2020  ? ? ?No results found for: PREALBUMIN ? ?  Latest Ref Rng & Units 01/16/2022  ?  2:15 PM 03/01/2021  ?  3:07 PM 03/01/2021  ?  2:00 PM  ?CBC EXTENDED  ?WBC 3.4 - 10.8 x10E3/uL 8.4   7.9   8.6    ?RBC 4.14 - 5.80 x10E6/uL 4.93  4.40   4.54    ?Hemoglobin 13.0 - 17.7 g/dL 15.8   14.2   14.5    ?HCT 37.5 - 51.0 % 45.5   42.8   43.8    ?Platelets 150 - 450 x10E3/uL 213   201   215.0    ?NEUT# 1.4 - 7.0 x10E3/uL 5.1    5.2    ?Lymph# 0.7 - 3.1 x10E3/uL 1.6    1.7    ? ? ? ?There is no height or weight on file to calculate BMI. ? ?Orders:  ?No orders of the defined types were placed in this encounter. ? ?No orders of the defined types were placed in this encounter. ? ? ? Procedures: ?No procedures performed ? ?Clinical Data: ?No additional findings. ? ?ROS: ? ?All other systems negative, except as noted in the HPI. ?Review of Systems ? ?Objective: ?Vital Signs: There were no vitals taken for this visit. ? ?Specialty Comments:  ?No specialty comments available. ? ?PMFS History: ?Patient Active Problem List  ? Diagnosis Date Noted  ? Recurrent falls 08/25/2021  ? Memory  difficulty 07/04/2021  ? Chronic toe ulcer, left, with fat layer exposed ([place]) 01/23/2021  ? Pituitary adenoma ([place]) 09/06/2020  ? Chronic intractable headache 07/27/2020  ? Vitamin B12 deficiency 03/31/2020  ? Vitamin D deficiency 03/31/2020  ? Numbness of foot 03/25/2020  ? Right hand pain 12/14/2019  ? Status post amputation of great toe, left ([place]) 12/14/2019  ? Callus of foot 04/02/2019  ? Allergy to bee sting 03/11/2019  ? Insomnia 02/10/2019  ? Status post lumbar spine operative procedure for decompression of spinal cord 12/12/2018  ? Dyslipidemia 10/19/2018  ? Foot deformity 10/19/2018  ? Spinal stenosis of lumbar region without neurogenic claudication 10/01/2018  ? Facial rash 05/26/2018  ? Status post reverse total replacement of right shoulder 05/13/2018  ? CHF (congestive heart failure) ([place]) 12/27/2017  ? OSA (obstructive sleep apnea) 12/27/2017  ? Health maintenance examination 11/25/2017  ? CKD (chronic kidney disease) stage 3, GFR 30-59 ml/min ([facility]) 10/01/2017  ? Fatty liver 10/01/2017  ? Status post reverse total replacement of left shoulder 09/03/2017  ? Bilateral shoulder pain 05/24/2017  ? History of CVA in adulthood 03/08/2017  ? S/P total knee arthroplasty, left 01/30/2017  ? Arthritis of knee 01/15/2017  ? Skin rash 12/10/2016  ? Medicare annual wellness visit, subsequent 10/05/2016  ? Advanced care planning/counseling discussion 10/05/2016  ? Obesity, morbid, BMI 40.0-49.9 ([place]) 07/19/2016  ? Hypothyroidism   ? Iron deficiency 04/23/2016  ? Chronic pain syndrome 04/23/2016  ? Inflammatory arthritis 04/23/2016  ? Chronic leg pain 04/23/2016  ? Lesion of palate 04/23/2016  ? Grieving 04/23/2016  ? GERD (gastroesophageal reflux disease)   ? Duodenitis determined by biopsy 02/13/2016  ? Diverticulosis of colon without hemorrhage 01/31/2016  ? Chest pain 01/11/2012  ? Migraine 01/11/2012  ? History of methicillin resistant staphylococcus aureus (MRSA) 07/04/2010  ? DDD (degenerative disc disease),  lumbar 07/04/2010  ? History of total right knee replacement 07/04/2010  ? Primary osteoarthritis, right shoulder 06/09/2007  ? Personal history of colonic adenoma 06/01/2003  ? ?Past Medical History:  ?Diagnosis Date  ? Acute kidney failure 08/2008  ? "cleared up"  no problems since  ? Allergy   ? Anxiety   ? Arthritis   ? Asthma   ? ?of this no inhaler  ? Blood transfusion   ? CHF (congestive heart failure) ([place])   ? ?of this, pt denies  ? CLOSTRIDIUM DIFFICILE COLITIS  07/04/2010  ? Annotation: 12/09, 2/10 Qualifier: Diagnosis of  By: [name], [name]    ? Depression with anxiety   ? Duodenitis determined by biopsy 02/2016  ? peptic likely due to aleve (erosive gastropathy with duodenal erosions)  ? ECZEMA 07/04/2010  ? Qualifier: Diagnosis of  By: [name], [name]    ? Elevated liver enzymes   ? Fatty liver   ? Full dentures   ? GERD (gastroesophageal reflux disease)   ? Heart attack ([place])   ? 08/2008 (likely demand ischemia in the setting of MSSA sepsis/R TKA  infection)10-2008  ? History of hiatal hernia   ? History of stomach ulcers   ? Hypothyroidism   ? Lower GI bleeding   ? MALAR AND MAXILLARY BONES CLOSED FRACTURE 07/04/2010  ? Annotation: ORIF Qualifier: Diagnosis of  By: [name], [name]    ? Migraine   ? "definitely"  ? MRSA (methicillin resistant Staphylococcus aureus)   ? in leg, had to place steel rod in leg  ? Personal history of colonic adenoma 06/01/2003  ? PFO (patent foramen ovale)   ? small PFO by 08/2008 TEE  ? Pneumonia 08/2008  ? "while in ICU"  ? Seizures ([place]) 2009  ? "long time ago"    ? Sleep apnea   ? does not wear CPAP  ? Stroke [facility]) 2010  ? unable to complete sentences at times  ? Wears glasses   ?  ?Family History  ?Problem Relation Age of Onset  ? Coronary artery disease Father 32  ? Stroke Father   ? CAD Mother 94  ?     stents  ? Breast cancer Sister   ? Diabetes Brother   ? Diabetes Sister   ? Colon cancer Neg Hx   ? Stomach cancer Neg Hx   ?  ?Past Surgical History:   ?Procedure Laterality Date  ? AMPUTATION Left 01/24/2022  ? Procedure: LEFT GREAT TOE AMPUTATION AT METATARSOPHALANGEAL JOINT AND SECOND TOE AMPUTATION;  Surgeon: Newt Minion, MD;  Location: [place];  Service: Alita [facility]

## 2022-02-01 ENCOUNTER — Telehealth: Payer: Self-pay | Admitting: Neurology

## 2022-02-05 ENCOUNTER — Ambulatory Visit (INDEPENDENT_AMBULATORY_CARE_PROVIDER_SITE_OTHER): Payer: Medicare Other | Admitting: Orthopedic Surgery

## 2022-02-05 DIAGNOSIS — Z89412 Acquired absence of left great toe: Secondary | ICD-10-CM

## 2022-02-18 ENCOUNTER — Encounter: Payer: Self-pay | Admitting: Orthopedic Surgery

## 2022-02-18 NOTE — Progress Notes (Signed)
? ?Office Visit Note ?  ?Patient: Nicholas [name]           ?Date of Birth: 23-Oct-1950           ?MRN: [id number] ?Visit Date: 02/05/2022 ?             ?Requested by: [name], MD ?[address] ?[place],  [place] [id number] ?PCP: [name], MD ? ?Chief Complaint  ?Patient presents with  ? Left Foot - Routine Post Op  ?  01/24/22 left GT amptation at [facility] and 2nd toe amputation  ? ? ? ? ?HPI: ?Patient is a 71 year old gentleman who is 2 weeks status post left great toe amputation and second toe amputation.  Patient states he is having some back pain recently. ? ?Assessment & Plan: ?Visit Diagnoses:  ?1. Left great toe amputee ([place])   ? ? ?Plan: Sutures harvested continue with postoperative shoe follow-up in 4 weeks. ? ?Follow-Up Instructions: Return in about 4 weeks (around 03/05/2022).  ? ?Ortho Exam ? ?Patient is alert, oriented, no adenopathy, well-dressed, normal affect, normal respiratory effort. ?Examination the incision is well-healed sutures are harvested. ? ?Imaging: ?No results found. ?No images are attached to the encounter. ? ?Labs: ?Lab Results  ?Component Value Date  ? HGBA1C 5.6 07/13/2010  ? HGBA1C  12/08/2008  ?  5.2 ?(NOTE)   The ADA recommends the following therapeutic goal for glycemic   control related to Hgb A1C measurement:   Goal of Therapy:   < 7.0% Hgb A1C   Reference: American Diabetes Association: Clinical Practice   Recommendations 2008, Diabetes Care,  ?2008, 31:(Suppl 1).  ? HGBA1C  11/23/2008  ?  5.1 ?(NOTE)   The ADA recommends the following therapeutic goal for glycemic   control related to Hgb A1C measurement:   Goal of Therapy:   < 7.0% Hgb A1C   Reference: American Diabetes Association: Clinical Practice   Recommendations 2008, Diabetes Care,  ?2008, 31:(Suppl 1).  ? ESRSEDRATE 28 (H) 03/01/2021  ? ESRSEDRATE 18 08/03/2020  ? ESRSEDRATE 22 (H) 01/11/2012  ? LABURIC 8.8 (H) 03/01/2021  ? LABURIC 5.0 02/08/2016  ? REPTSTATUS 05/07/2018 FINAL 05/05/2018  ? GRAMSTAIN   03/29/2009  ?  NO WBC SEEN ?NO SQUAMOUS EPITHELIAL CELLS SEEN ?NO ORGANISMS SEEN  ? GRAMSTAIN  03/29/2009  ?  NO WBC SEEN ?NO SQUAMOUS EPITHELIAL CELLS SEEN ?NO ORGANISMS SEEN  ? CULT (A) 05/05/2018  ?  <10,000 COLONIES/mL INSIGNIFICANT GROWTH ?Performed at [facility], [place] [address], [place], [place] [address] ?  ? LABORGA STAPHYLOCOCCUS SPECIES (COAGULASE NEGATIVE) 11/21/2008  ? ? ? ?Lab Results  ?Component Value Date  ? ALBUMIN 4.6 01/16/2022  ? ALBUMIN 4.2 07/04/2021  ? ALBUMIN 4.0 12/08/2020  ? ? ?Lab Results  ?Component Value Date  ? MG 2.1 04/12/2016  ? MG 1.8 03/31/2009  ? MG 1.7 03/29/2009  ? ?Lab Results  ?Component Value Date  ? VD25OH 40.71 07/04/2021  ? VD25OH 25.83 (L) 12/08/2020  ? VD25OH 45.00 06/15/2020  ? ? ?No results found for: PREALBUMIN ? ?  Latest Ref Rng & Units 01/16/2022  ?  2:15 PM 03/01/2021  ?  3:07 PM 03/01/2021  ?  2:00 PM  ?CBC EXTENDED  ?WBC 3.4 - 10.8 x10E3/uL 8.4   7.9   8.6    ?RBC 4.14 - 5.80 x10E6/uL 4.93   4.40   4.54    ?Hemoglobin 13.0 - 17.7 g/dL 15.8   14.2   14.5    ?  HCT 37.5 - 51.0 % 45.5   42.8   43.8    ?Platelets 150 - 450 x10E3/uL 213   201   215.0    ?NEUT# 1.4 - 7.0 x10E3/uL 5.1    5.2    ?Lymph# 0.7 - 3.1 x10E3/uL 1.6    1.7    ? ? ? ?There is no height or weight on file to calculate BMI. ? ?Orders:  ?No orders of the defined types were placed in this encounter. ? ?No orders of the defined types were placed in this encounter. ? ? ? Procedures: ?No procedures performed ? ?Clinical Data: ?No additional findings. ? ?ROS: ? ?All other systems negative, except as noted in the HPI. ?Review of Systems ? ?Objective: ?Vital Signs: There were no vitals taken for this visit. ? ?Specialty Comments:  ?No specialty comments available. ? ?PMFS History: ?Patient Active Problem List  ? Diagnosis Date Noted  ? Recurrent falls 08/25/2021  ? Memory difficulty 07/04/2021  ? Chronic toe ulcer, left, with fat layer exposed ([place]) 01/23/2021  ? Pituitary adenoma ([place]) 09/06/2020  ?  Chronic intractable headache 07/27/2020  ? Vitamin B12 deficiency 03/31/2020  ? Vitamin D deficiency 03/31/2020  ? Numbness of foot 03/25/2020  ? Right hand pain 12/14/2019  ? Status post amputation of great toe, left ([place]) 12/14/2019  ? Callus of foot 04/02/2019  ? Allergy to bee sting 03/11/2019  ? Insomnia 02/10/2019  ? Status post lumbar spine operative procedure for decompression of spinal cord 12/12/2018  ? Dyslipidemia 10/19/2018  ? Foot deformity 10/19/2018  ? Spinal stenosis of lumbar region without neurogenic claudication 10/01/2018  ? Facial rash 05/26/2018  ? Status post reverse total replacement of right shoulder 05/13/2018  ? CHF (congestive heart failure) ([place]) 12/27/2017  ? OSA (obstructive sleep apnea) 12/27/2017  ? Health maintenance examination 11/25/2017  ? CKD (chronic kidney disease) stage 3, GFR 30-59 ml/min (HCC) 10/01/2017  ? Fatty liver 10/01/2017  ? Status post reverse total replacement of left shoulder 09/03/2017  ? Bilateral shoulder pain 05/24/2017  ? History of CVA in adulthood 03/08/2017  ? S/P total knee arthroplasty, left 01/30/2017  ? Arthritis of knee 01/15/2017  ? Skin rash 12/10/2016  ? Medicare annual wellness visit, subsequent 10/05/2016  ? Advanced care planning/counseling discussion 10/05/2016  ? Obesity, morbid, BMI 40.0-49.9 ([facility]) 07/19/2016  ? Hypothyroidism   ? Iron deficiency 04/23/2016  ? Chronic pain syndrome 04/23/2016  ? Inflammatory arthritis 04/23/2016  ? Chronic leg pain 04/23/2016  ? Lesion of palate 04/23/2016  ? Grieving 04/23/2016  ? GERD (gastroesophageal reflux disease)   ? Duodenitis determined by biopsy 02/13/2016  ? Diverticulosis of colon without hemorrhage 01/31/2016  ? Chest pain 01/11/2012  ? Migraine 01/11/2012  ? History of methicillin resistant staphylococcus aureus (MRSA) 07/04/2010  ? DDD (degenerative disc disease), lumbar 07/04/2010  ? History of total right knee replacement 07/04/2010  ? Primary osteoarthritis, right shoulder 06/09/2007  ?  Personal history of colonic adenoma 06/01/2003  ? ?Past Medical History:  ?Diagnosis Date  ? Acute kidney failure 08/2008  ? "cleared up"  no problems since  ? Allergy   ? Anxiety   ? Arthritis   ? Asthma   ? ?of this no inhaler  ? Blood transfusion   ? CHF (congestive heart failure) ([place])   ? ?of this, pt denies  ? CLOSTRIDIUM DIFFICILE COLITIS 07/04/2010  ? Annotation: 12/09, 2/10 Qualifier: Diagnosis of  By: [name], [name]    ? Depression with anxiety   ?  Duodenitis determined by biopsy 02/2016  ? peptic likely due to aleve (erosive gastropathy with duodenal erosions)  ? ECZEMA 07/04/2010  ? Qualifier: Diagnosis of  By: [name], [name]    ? Elevated liver enzymes   ? Fatty liver   ? Full dentures   ? GERD (gastroesophageal reflux disease)   ? Heart attack ([place])   ? 08/2008 (likely demand ischemia in the setting of MSSA sepsis/R TKA  infection)10-2008  ? History of hiatal hernia   ? History of stomach ulcers   ? Hypothyroidism   ? Lower GI bleeding   ? MALAR AND MAXILLARY BONES CLOSED FRACTURE 07/04/2010  ? Annotation: ORIF Qualifier: Diagnosis of  By: [name], [name]    ? Migraine   ? "definitely"  ? MRSA (methicillin resistant Staphylococcus aureus)   ? in leg, had to place steel rod in leg  ? Personal history of colonic adenoma 06/01/2003  ? PFO (patent foramen ovale)   ? small PFO by 08/2008 TEE  ? Pneumonia 08/2008  ? "while in ICU"  ? Seizures ([place]) 2009  ? "long time ago"    ? Sleep apnea   ? does not wear CPAP  ? Stroke [facility]) 2010  ? unable to complete sentences at times  ? Wears glasses   ?  ?Family History  ?Problem Relation Age of Onset  ? Coronary artery disease Father 61  ? Stroke Father   ? CAD Mother 1  ?     stents  ? Breast cancer Sister   ? Diabetes Brother   ? Diabetes Sister   ? Colon cancer Neg Hx   ? Stomach cancer Neg Hx   ?  ?Past Surgical History:  ?Procedure Laterality Date  ? AMPUTATION Left 01/24/2022  ? Procedure: LEFT GREAT TOE AMPUTATION AT METATARSOPHALANGEAL JOINT AND SECOND  TOE AMPUTATION;  Surgeon: Newt Minion, MD;  Location: [facility];  Service: Orthopedics;  Laterality: Left;  ? anterior  nerve transposition  07/2009  ? left ulnar nerve  ? antibiotic spacer exchange  11/2008

## 2022-02-19 ENCOUNTER — Encounter: Payer: Self-pay | Admitting: Family Medicine

## 2022-02-19 ENCOUNTER — Ambulatory Visit (INDEPENDENT_AMBULATORY_CARE_PROVIDER_SITE_OTHER): Payer: Medicare Other | Admitting: Family Medicine

## 2022-02-19 VITALS — BP 124/70 | HR 61 | Temp 97.5°F | Ht 68.0 in | Wt 266.4 lb

## 2022-02-19 DIAGNOSIS — Z0000 Encounter for general adult medical examination without abnormal findings: Secondary | ICD-10-CM

## 2022-02-19 DIAGNOSIS — Z86010 Personal history of colonic polyps: Secondary | ICD-10-CM | POA: Diagnosis not present

## 2022-02-19 DIAGNOSIS — G8929 Other chronic pain: Secondary | ICD-10-CM

## 2022-02-19 DIAGNOSIS — N1831 Chronic kidney disease, stage 3a: Secondary | ICD-10-CM | POA: Diagnosis not present

## 2022-02-19 DIAGNOSIS — Z0001 Encounter for general adult medical examination with abnormal findings: Secondary | ICD-10-CM | POA: Diagnosis not present

## 2022-02-19 DIAGNOSIS — I509 Heart failure, unspecified: Secondary | ICD-10-CM

## 2022-02-19 DIAGNOSIS — Z7189 Other specified counseling: Secondary | ICD-10-CM | POA: Diagnosis not present

## 2022-02-19 DIAGNOSIS — R296 Repeated falls: Secondary | ICD-10-CM

## 2022-02-19 DIAGNOSIS — M79604 Pain in right leg: Secondary | ICD-10-CM

## 2022-02-19 DIAGNOSIS — M1990 Unspecified osteoarthritis, unspecified site: Secondary | ICD-10-CM | POA: Diagnosis not present

## 2022-02-19 DIAGNOSIS — Z8673 Personal history of transient ischemic attack (TIA), and cerebral infarction without residual deficits: Secondary | ICD-10-CM

## 2022-02-19 DIAGNOSIS — Z125 Encounter for screening for malignant neoplasm of prostate: Secondary | ICD-10-CM | POA: Diagnosis not present

## 2022-02-19 DIAGNOSIS — E039 Hypothyroidism, unspecified: Secondary | ICD-10-CM | POA: Diagnosis not present

## 2022-02-19 DIAGNOSIS — E611 Iron deficiency: Secondary | ICD-10-CM

## 2022-02-19 DIAGNOSIS — Z89412 Acquired absence of left great toe: Secondary | ICD-10-CM

## 2022-02-19 DIAGNOSIS — E538 Deficiency of other specified B group vitamins: Secondary | ICD-10-CM

## 2022-02-19 DIAGNOSIS — G894 Chronic pain syndrome: Secondary | ICD-10-CM

## 2022-02-19 DIAGNOSIS — K2100 Gastro-esophageal reflux disease with esophagitis, without bleeding: Secondary | ICD-10-CM

## 2022-02-19 DIAGNOSIS — E559 Vitamin D deficiency, unspecified: Secondary | ICD-10-CM | POA: Diagnosis not present

## 2022-02-19 DIAGNOSIS — E785 Hyperlipidemia, unspecified: Secondary | ICD-10-CM

## 2022-02-19 DIAGNOSIS — M48061 Spinal stenosis, lumbar region without neurogenic claudication: Secondary | ICD-10-CM

## 2022-02-19 DIAGNOSIS — Z9889 Other specified postprocedural states: Secondary | ICD-10-CM

## 2022-02-19 LAB — CBC WITH DIFFERENTIAL/PLATELET
Basophils Absolute: 0.1 10*3/uL (ref 0.0–0.1)
Basophils Relative: 0.9 % (ref 0.0–3.0)
Eosinophils Absolute: 0.3 10*3/uL (ref 0.0–0.7)
Eosinophils Relative: 3.5 % (ref 0.0–5.0)
HCT: 43.3 % (ref 39.0–52.0)
Hemoglobin: 14.6 g/dL (ref 13.0–17.0)
Lymphocytes Relative: 22.2 % (ref 12.0–46.0)
Lymphs Abs: 1.6 10*3/uL (ref 0.7–4.0)
MCHC: 33.8 g/dL (ref 30.0–36.0)
MCV: 97.4 fl (ref 78.0–100.0)
Monocytes Absolute: 1 10*3/uL (ref 0.1–1.0)
Monocytes Relative: 13.2 % — ABNORMAL HIGH (ref 3.0–12.0)
Neutro Abs: 4.4 10*3/uL (ref 1.4–7.7)
Neutrophils Relative %: 60.2 % (ref 43.0–77.0)
Platelets: 189 10*3/uL (ref 150.0–400.0)
RBC: 4.44 Mil/uL (ref 4.22–5.81)
RDW: 13.5 % (ref 11.5–15.5)
WBC: 7.4 10*3/uL (ref 4.0–10.5)

## 2022-02-19 LAB — COMPREHENSIVE METABOLIC PANEL
ALT: 21 U/L (ref 0–53)
AST: 25 U/L (ref 0–37)
Albumin: 4.2 g/dL (ref 3.5–5.2)
Alkaline Phosphatase: 60 U/L (ref 39–117)
BUN: 29 mg/dL — ABNORMAL HIGH (ref 6–23)
CO2: 30 mEq/L (ref 19–32)
Calcium: 8.7 mg/dL (ref 8.4–10.5)
Chloride: 102 mEq/L (ref 96–112)
Creatinine, Ser: 1.46 mg/dL (ref 0.40–1.50)
GFR: 48.17 mL/min — ABNORMAL LOW (ref >60.00)
Glucose, Bld: 136 mg/dL — ABNORMAL HIGH (ref 70–99)
Potassium: 4.1 mEq/L (ref 3.5–5.1)
Sodium: 140 mEq/L (ref 135–145)
Total Bilirubin: 1.1 mg/dL (ref 0.2–1.2)
Total Protein: 7 g/dL (ref 6.0–8.3)

## 2022-02-19 LAB — LIPID PANEL
Cholesterol: 117 mg/dL (ref 0–200)
HDL: 40.6 mg/dL (ref >39.00)
LDL Cholesterol: 49 mg/dL (ref 0–99)
NonHDL: 76.2
Total CHOL/HDL Ratio: 3
Triglycerides: 137 mg/dL (ref 0.0–149.0)
VLDL: 27.4 mg/dL (ref 0.0–40.0)

## 2022-02-19 LAB — IBC PANEL
Iron: 97 ug/dL (ref 42–165)
Saturation Ratios: 34.1 % (ref 20.0–50.0)
TIBC: 284.2 ug/dL (ref 250.0–450.0)
Transferrin: 203 mg/dL — ABNORMAL LOW (ref 212.0–360.0)

## 2022-02-19 MED ORDER — CYANOCOBALAMIN 1000 MCG/ML IJ SOLN
1000.0000 ug | Freq: Once | INTRAMUSCULAR | Status: AC
  Administered 2022-02-19: 1000 ug via INTRAMUSCULAR

## 2022-02-19 NOTE — Assessment & Plan Note (Signed)
Advanced directive discussion - has not set up. Son would be HCPOA. Packet previously provided.  ?

## 2022-02-19 NOTE — Assessment & Plan Note (Signed)

## 2022-02-19 NOTE — Assessment & Plan Note (Signed)
Preventative protocols reviewed and updated unless pt declined. Discussed healthy diet and lifestyle.  

## 2022-02-19 NOTE — Progress Notes (Signed)
? ? Patient ID: Nicholas [name], male    DOB: [birth year], 71 y.o.   MRN: [id number] ? ?This visit was conducted in person. ? ?BP 124/70   Pulse 61   Temp (!) 97.5 ?F (36.4 ?C) (Temporal)   Ht '5\' 8"'$  (1.727 m)   Wt 266 lb 6 oz (120.8 kg)   SpO2 97%   BMI 40.50 kg/m?   ? ?CC: AMW/CPE ?Subjective:  ? ?HPI: ?Nicholas [name] is a 71 y.o. male presenting on 02/19/2022 for Medicare Wellness ? ? ?Did not see health advisor this year.  ? ?Hearing Screening  ? '500Hz'$  '1000Hz'$  '2000Hz'$  '4000Hz'$   ?Right ear '20 25 25 '$ 0  ?Left ear '20 25 25 '$ 0  ?Vision Screening - Comments:: Last eye exam, Jan/Feb 2023.  ?[place] Office Visit from 02/19/2022 in [facility]  ?PHQ-2 Total Score 0  ? ?  ?  ? ?  02/19/2022  ? 11:32 AM 02/14/2021  ?  2:44 PM 12/11/2019  ?  2:04 PM 12/10/2018  ? 11:47 AM 11/18/2017  ? 11:31 AM  ?Fall Risk   ?Falls in the past year? 1  1 0 Yes  ?Comment   tripped and fell  lost balance and fell  ?Number falls in past yr: 1 1 0  1  ?Injury with Fall? 0 0 0  No  ?Risk for fall due to :   Medication side effect;Impaired mobility    ?Follow up   Falls evaluation completed;Falls prevention discussed    ? ? ?L3/4 L4/5 decompression 11/2018 for severe spinal stenosis Nicholas [name]) with improved back pain. Having worsening left lower back pain with radiation down left posterior thigh despite amitriptyline '100mg'$  nightly, gabapentin 300/600/'600mg'$ , and oxycodone PRN (after recent toe amputation - to see Dr Nicholas [name] later this month.  ? ?S/p several 1st/2nd L toe amputations for osteomyelitis with gangrene, latest complete L great and 2nd toe amputations 01/2022 Nicholas [name]) due to pain and ulcers. He is having trouble placing compression stockings on due to back pain.  ? ?B12 restarted 06/2021, has been receiving shots every other month.  ?He notes limitations in ADLs as per below. He would benefit from personal care services.  ?Activities of Daily Living:  ?   Bathing- needs assistance getting into the shower. He has a tub at home - this is  dangerous.  ?   Dressing- needs assistance getting socks and shoes on  ?   Eating- independent  ?   Toileting- would need help getting on and off the commode ?   Transferring- would need help getting into and out of chair ?   Continence- independent ?Overall Assessment: dependent ? ?Instrumental Activities of Daily Living:  ?   Transportation- driving alone, would benefit from assistance  ?   Meal/Food Preparation- independent ?   Shopping Errands- independent ?   Housekeeping/Chores- unable to complete due to pain/stiffness  ?   Money Management/Finances- independent ?   Medication Management- independent ?   Ability to Use Telephone- independent  ?   Laundry- independent ?Overall Assessment:  independent ?  ?Preventative: ?COLONOSCOPY 11/2012 diverticulosis, rpt 10 yrs Nicholas [name]) ?Prostate cancer screening - declines DRE, continues PSA  ?Lung cancer screening - not eligible  ?DEXA - pt states he had this done at [facility] but I've never received records  ?Flu shot yearly - didn't get this year  ?COVID vaccine - declines  ?Td 2007  ?Pneumovax 2012 - rpt 02/2021, prevnar-13 2017  ?zostavax - ~2014  ?  Shingrix - 11/2017, 04/2018  ?Advanced directive discussion - has not set up. Son would be HCPOA. Packet previously provided.  ?Seat belt use discussed  ?Sunscreen use discussed. No changing moles on skin.  ?Non smoker  ?Alcohol - none  ?Dentist yearly ?Eye exam yearly ?Bowel - no constipation  ?Bladder - new urge incontinence - this happens about twice a week. He is not using any incontinence supplies.  ?  ?Widower 2016 - wife passed from ESRD related illness ?Lives in [place] in [place]. ?M[employer]on  ?Occ: landscaping ?Edu: HS ?Activity: no regular exercise - residual deficits due to multiple surgeries  ?Diet: some water, fruits/vegetables daily ?   ? ?Relevant past medical, surgical, family and social history reviewed and updated as indicated. Interim medical history since our last visit reviewed. ?Allergies  and medications reviewed and updated. ?Outpatient Medications Prior to Visit  ?Medication Sig Dispense Refill  ? acetaminophen (TYLENOL) 500 MG tablet Take 1,000 mg by mouth 3 (three) times daily.    ? amitriptyline (ELAVIL) 50 MG tablet TAKE TWO TABLETS BY MOUTH EVERYDAY AT BEDTIME (Patient taking differently: Take 100 mg by mouth at bedtime.) 180 tablet 2  ? B Complex Vitamins (B-COMPLEX/B-12 PO) Take 1 tablet by mouth daily.    ? Cholecalciferol (VITAMIN D3) 25 MCG (1000 UT) CAPS Take 1 capsule (1,000 Units total) by mouth daily. 90 capsule 1  ? clotrimazole (LOTRIMIN) 1 % cream Apply 1 application topically 2 (two) times daily. To groin (Patient taking differently: Apply 1 application. topically 2 (two) times daily as needed (rash).) 60 g 1  ? divalproex (DEPAKOTE ER) 500 MG 24 hr tablet Take 1 tablet (500 mg total) by mouth at bedtime. 30 tablet 5  ? EPINEPHrine 0.3 mg/0.3 mL IJ SOAJ injection Inject 0.3 mg into the muscle as needed for anaphylaxis. (bee stings) 2 each 1  ? ferrous sulfate 325 (65 FE) MG EC tablet Take 325 mg by mouth daily with breakfast.    ? furosemide (LASIX) 20 MG tablet TAKE ONE TABLET BY MOUTH EVERY MORNING (Patient taking differently: Take 20 mg by mouth daily.) 90 tablet 1  ? gabapentin (NEURONTIN) 300 MG capsule TAKE ONE CAPSULE BY MOUTH EVERY MORNING and TAKE TWO CAPSULES AT NOON and TAKE TWO CAPSULES EVERY EVENING (Patient taking differently: Take 300 mg by mouth 3 (three) times daily.) 450 capsule 1  ? ketoconazole (NIZORAL) 2 % shampoo Apply 1 application topically 3 (three) times a week. 120 mL 3  ? levothyroxine (SYNTHROID) 100 MCG tablet Take 1 tablet (100 mcg total) by mouth daily before breakfast. 90 tablet 3  ? metoprolol succinate (TOPROL-XL) 100 MG 24 hr tablet Take 1 tablet (100 mg total) by mouth daily. Take with or immediately following a meal. 90 tablet 3  ? nitroGLYCERIN (NITROSTAT) 0.4 MG SL tablet Place 1 tablet (0.4 mg total) under the tongue every 5 (five) minutes  as needed for chest pain. 20 tablet 3  ? ondansetron (ZOFRAN ODT) 4 MG disintegrating tablet Take 1 tablet (4 mg total) by mouth every 8 (eight) hours as needed. (Patient taking differently: Take 4 mg by mouth every 8 (eight) hours as needed for nausea or vomiting.) 20 tablet 3  ? oxyCODONE-acetaminophen (PERCOCET/ROXICET) 5-325 MG tablet Take 1 tablet by mouth every 4 (four) hours as needed. 30 tablet 0  ? pantoprazole (PROTONIX) 40 MG tablet TAKE ONE TABLET BY MOUTH ONCE DAILY 90 tablet 2  ? tiZANidine (ZANAFLEX) 4 MG tablet Take 1 tablet (4 mg total)  by mouth every 6 (six) hours as needed (for acute headache treatment). 20 tablet 1  ? Ubrogepant (UBRELVY) 50 MG TABS Take 50 mg by mouth daily as needed. (Patient taking differently: Take 50 mg by mouth daily as needed.) 12 tablet 11  ? atorvastatin (LIPITOR) 20 MG tablet Take 1 tablet (20 mg total) by mouth daily. 90 tablet 3  ? ?No facility-administered medications prior to visit.  ?  ? ?Per HPI unless specifically indicated in ROS section below ?Review of Systems  ?Constitutional:  Negative for activity change, appetite change, chills, fatigue, fever and unexpected weight change.  ?HENT:  Negative for hearing loss.   ?Eyes:  Negative for visual disturbance.  ?Respiratory:  Negative for chest tightness, shortness of breath and wheezing. Cough: occ.  ?Cardiovascular:  Positive for leg swelling. Negative for chest pain and palpitations.  ?Gastrointestinal:  Negative for abdominal distention, abdominal pain, blood in stool, constipation, diarrhea, nausea and vomiting.  ?Genitourinary:  Negative for difficulty urinating and hematuria.  ?Musculoskeletal:  Negative for arthralgias, myalgias and neck pain.  ?Skin:  Negative for rash.  ?Neurological:  Positive for headaches. Negative for dizziness, seizures and syncope.  ?Hematological:  Negative for adenopathy. Does not bruise/bleed easily.  ?Psychiatric/Behavioral:  Negative for dysphoric mood. The patient is not  nervous/anxious.   ? ?Objective:  ?BP 124/70   Pulse 61   Temp (!) 97.5 ?F (36.4 ?C) (Temporal)   Ht '5\' 8"'$  (1.727 m)   Wt 266 lb 6 oz (120.8 kg)   SpO2 97%   BMI 40.50 kg/m?   ?Wt Readings from Last 3 Encounters:

## 2022-02-19 NOTE — Patient Instructions (Addendum)
Labs today then B12 shot today.  ?Good to see you today. ?Return as needed or in 6 months for follow up visit.  ? ?Health Maintenance After Age 71 ?After age 49, you are at a higher risk for certain long-term diseases and infections as well as injuries from falls. Falls are a major cause of broken bones and head injuries in people who are older than age 38. Getting regular preventive care can help to keep you healthy and well. Preventive care includes getting regular testing and making lifestyle changes as recommended by your health care provider. Talk with your health care provider about: ?Which screenings and tests you should have. A screening is a test that checks for a disease when you have no symptoms. ?A diet and exercise plan that is right for you. ?What should I know about screenings and tests to prevent falls? ?Screening and testing are the best ways to find a health problem early. Early diagnosis and treatment give you the best chance of managing medical conditions that are common after age 24. Certain conditions and lifestyle choices may make you more likely to have a fall. Your health care provider may recommend: ?Regular vision checks. Poor vision and conditions such as cataracts can make you more likely to have a fall. If you wear glasses, make sure to get your prescription updated if your vision changes. ?Medicine review. Work with your health care provider to regularly review all of the medicines you are taking, including over-the-counter medicines. Ask your health care provider about any side effects that may make you more likely to have a fall. Tell your health care provider if any medicines that you take make you feel dizzy or sleepy. ?Strength and balance checks. Your health care provider may recommend certain tests to check your strength and balance while standing, walking, or changing positions. ?Foot health exam. Foot pain and numbness, as well as not wearing proper footwear, can make you more  likely to have a fall. ?Screenings, including: ?Osteoporosis screening. Osteoporosis is a condition that causes the bones to get weaker and break more easily. ?Blood pressure screening. Blood pressure changes and medicines to control blood pressure can make you feel dizzy. ?Depression screening. You may be more likely to have a fall if you have a fear of falling, feel depressed, or feel unable to do activities that you used to do. ?Alcohol use screening. Using too much alcohol can affect your balance and may make you more likely to have a fall. ?Follow these instructions at home: ?Lifestyle ?Do not drink alcohol if: ?Your health care provider tells you not to drink. ?If you drink alcohol: ?Limit how much you have to: ?0-1 drink a day for women. ?0-2 drinks a day for men. ?Know how much alcohol is in your drink. In the [place] one drink equals one 12 oz bottle of beer (355 mL), one 5 oz glass of wine (148 mL), or one 1? oz glass of hard liquor (44 mL). ?Do not use any products that contain nicotine or tobacco. These products include cigarettes, chewing tobacco, and vaping devices, such as e-cigarettes. If you need help quitting, ask your health care provider. ?Activity ? ?Follow a regular exercise program to stay fit. This will help you maintain your balance. Ask your health care provider what types of exercise are appropriate for you. ?If you need a cane or walker, use it as recommended by your health care provider. ?Wear supportive shoes that have nonskid soles. ?Safety ? ?Remove any tripping  hazards, such as rugs, cords, and clutter. ?Install safety equipment such as grab bars in bathrooms and safety rails on stairs. ?Keep rooms and walkways well-lit. ?General instructions ?Talk with your health care provider about your risks for falling. Tell your health care provider if: ?You fall. Be sure to tell your health care provider about all falls, even ones that seem minor. ?You feel dizzy, tiredness (fatigue), or  off-balance. ?Take over-the-counter and prescription medicines only as told by your health care provider. These include supplements. ?Eat a healthy diet and maintain a healthy weight. A healthy diet includes low-fat dairy products, low-fat (lean) meats, and fiber from whole grains, beans, and lots of fruits and vegetables. ?Stay current with your vaccines. ?Schedule regular health, dental, and eye exams. ?Summary ?Having a healthy lifestyle and getting preventive care can help to protect your health and wellness after age 36. ?Screening and testing are the best way to find a health problem early and help you avoid having a fall. Early diagnosis and treatment give you the best chance for managing medical conditions that are more common for people who are older than age 60. ?Falls are a major cause of broken bones and head injuries in people who are older than age 79. Take precautions to prevent a fall at home. ?Work with your health care provider to learn what changes you can make to improve your health and wellness and to prevent falls. ?This information is not intended to replace advice given to you by your health care provider. Make sure you discuss any questions you have with your health care provider. ?Document Revised: 02/20/2021 Document Reviewed: 02/20/2021 ?Elsevier Patient Education ? [place]. ? ?

## 2022-02-20 ENCOUNTER — Other Ambulatory Visit: Payer: Medicare Other

## 2022-02-20 DIAGNOSIS — N1831 Chronic kidney disease, stage 3a: Secondary | ICD-10-CM | POA: Diagnosis not present

## 2022-02-20 LAB — VITAMIN D 25 HYDROXY (VIT D DEFICIENCY, FRACTURES): VITD: 48.88 ng/mL (ref 30.00–100.00)

## 2022-02-20 LAB — TSH: TSH: 6.4 u[IU]/mL — ABNORMAL HIGH (ref 0.35–5.50)

## 2022-02-20 LAB — VITAMIN B12: Vitamin B-12: 518 pg/mL (ref 211–911)

## 2022-02-20 LAB — FERRITIN: Ferritin: 170.4 ng/mL (ref 22.0–322.0)

## 2022-02-20 LAB — PSA: PSA: 1.22 ng/mL (ref 0.10–4.00)

## 2022-02-21 ENCOUNTER — Other Ambulatory Visit: Payer: Self-pay | Admitting: Family Medicine

## 2022-02-21 ENCOUNTER — Other Ambulatory Visit: Payer: Self-pay | Admitting: Neurology

## 2022-02-21 LAB — MICROALBUMIN / CREATININE URINE RATIO
Creatinine,U: 176.7 mg/dL
Microalb Creat Ratio: 0.4 mg/g (ref 0.0–30.0)
Microalb, Ur: <0.7 mg/dL (ref 0.0–1.9)

## 2022-02-21 NOTE — Telephone Encounter (Signed)
Gabapentin ?Last filled:  01/26/22, #450 ?Last OV:  02/19/22, AWV ?Next OV:  08/22/22, 6 mo f/u ?

## 2022-02-22 ENCOUNTER — Telehealth: Payer: Self-pay

## 2022-02-22 ENCOUNTER — Telehealth: Payer: Self-pay | Admitting: Family Medicine

## 2022-02-22 ENCOUNTER — Ambulatory Visit (INDEPENDENT_AMBULATORY_CARE_PROVIDER_SITE_OTHER): Payer: Medicare Other | Admitting: Orthopaedic Surgery

## 2022-02-22 ENCOUNTER — Encounter: Payer: Self-pay | Admitting: Orthopaedic Surgery

## 2022-02-22 ENCOUNTER — Ambulatory Visit: Payer: Self-pay

## 2022-02-22 DIAGNOSIS — M545 Low back pain, unspecified: Secondary | ICD-10-CM

## 2022-02-22 MED ORDER — PREDNISONE 10 MG (21) PO TBPK: ORAL_TABLET | ORAL | 0 refills | Status: DC

## 2022-02-22 MED ORDER — METHOCARBAMOL 500 MG PO TABS: 500.0000 mg | ORAL_TABLET | Freq: Two times a day (BID) | ORAL | 2 refills | Status: DC | PRN

## 2022-02-22 NOTE — Assessment & Plan Note (Addendum)
Update FLP on atorvastatin. ?The ASCVD Risk score (Arnett DK, et al., 2019) failed to calculate for the following reasons: ?  The patient has a prior MI or stroke diagnosis  ?

## 2022-02-22 NOTE — Telephone Encounter (Signed)
ERx 

## 2022-02-22 NOTE — Assessment & Plan Note (Signed)
Continue 1000 IU replacement orally daily.  ?

## 2022-02-22 NOTE — Assessment & Plan Note (Signed)
Continue aspirin /statin. Previous stroke occurred during critical illness with sepsis.  ?

## 2022-02-22 NOTE — Chronic Care Management (AMB) (Signed)
? ? ?Chronic Care Management ?Pharmacy Assistant  ? ?Name: Nicholas [name]  MRN: [id number] DOB: December 30, 1950 ? ? ?Reason for Encounter: Medication adherence and Delivery Coordination ?  ? ?Recent office visits:  ?02/19/22-[name],MD(PCP) [facility]-discussed screenings, vaccines, labs ordered ( Plz notify liver function, prostate, iron levels and blood counts returned ok. Vit B12 and vit D were normal. Cholesterol levels returned normal. Continue oral iron and b12 shots,Thyroid levels were a bit low - I'd like to increase his levothyroxine dose to daily ,follow up 6 months ? ?Recent consult visits:  ?02/22/22-Naiping Xu,MD(orthopedic)-acute back pain,start steroid taper 10mg  21 tabs.  and muscle relaxer 500mg  take 1 tablet 2 times daily as needed for spasms ?01/25/22-[name],MD(ortho)f/u toe amputee surgery- follow up 1 week ? ?Hospital visits:  ?01/24/22-[name],MD(ortho)-OP left great toe amputation- start short course of oxycodone, no admission  ? ?Medications: ?Outpatient Encounter Medications as of 02/22/2022  ?Medication Sig Note  ? acetaminophen (TYLENOL) 500 MG tablet Take 1,000 mg by mouth 3 (three) times daily.   ? amitriptyline (ELAVIL) 50 MG tablet TAKE TWO TABLETS BY MOUTH EVERYDAY AT BEDTIME (Patient taking differently: Take 100 mg by mouth at bedtime.)   ? atorvastatin (LIPITOR) 20 MG tablet TAKE ONE TABLET BY MOUTH ONCE DAILY   ? B Complex Vitamins (B-COMPLEX/B-12 PO) Take 1 tablet by mouth daily.   ? Cholecalciferol (VITAMIN D3) 25 MCG (1000 UT) CAPS Take 1 capsule (1,000 Units total) by mouth daily.   ? clotrimazole (LOTRIMIN) 1 % cream Apply 1 application topically 2 (two) times daily. To groin (Patient taking differently: Apply 1 application. topically 2 (two) times daily as needed (rash).)   ? divalproex (DEPAKOTE ER) 500 MG 24 hr tablet Take 1 tablet (500 mg total) by mouth at bedtime.   ? EPINEPHrine 0.3 mg/0.3 mL IJ SOAJ injection Inject 0.3 mg into the muscle as needed for  anaphylaxis. (bee stings)   ? ferrous sulfate 325 (65 FE) MG EC tablet Take 325 mg by mouth daily with breakfast.   ? furosemide (LASIX) 20 MG tablet TAKE ONE TABLET BY MOUTH EVERY MORNING (Patient taking differently: Take 20 mg by mouth daily.)   ? gabapentin (NEURONTIN) 300 MG capsule TAKE ONE CAPSULE BY MOUTH EVERY MORNING and TAKE TWO CAPSULES AT NOON and TAKE TWO CAPSULES EVERY EVENING (Patient taking differently: Take 300 mg by mouth 3 (three) times daily.)   ? ketoconazole (NIZORAL) 2 % shampoo Apply 1 application topically 3 (three) times a week.   ? levothyroxine (SYNTHROID) 100 MCG tablet Take 1 tablet (100 mcg total) by mouth daily before breakfast.   ? metoprolol succinate (TOPROL-XL) 100 MG 24 hr tablet Take 1 tablet (100 mg total) by mouth daily. Take with or immediately following a meal.   ? nitroGLYCERIN (NITROSTAT) 0.4 MG SL tablet Place 1 tablet (0.4 mg total) under the tongue every 5 (five) minutes as needed for chest pain.   ? ondansetron (ZOFRAN ODT) 4 MG disintegrating tablet Take 1 tablet (4 mg total) by mouth every 8 (eight) hours as needed. (Patient taking differently: Take 4 mg by mouth every 8 (eight) hours as needed for nausea or vomiting.)   ? oxyCODONE-acetaminophen (PERCOCET/ROXICET) 5-325 MG tablet Take 1 tablet by mouth every 4 (four) hours as needed.   ? pantoprazole (PROTONIX) 40 MG tablet TAKE ONE TABLET BY MOUTH ONCE DAILY   ? tiZANidine (ZANAFLEX) 4 MG tablet Take 1 tablet (4 mg total) by mouth every 6 (six) hours as needed (for acute headache  treatment).   ? Ubrogepant (UBRELVY) 50 MG TABS Take 50 mg by mouth daily as needed. (Patient taking differently: Take 50 mg by mouth daily as needed.) 01/19/2022: New RX  ? ?No facility-administered encounter medications on file as of 02/22/2022.  ? ? ?BP Readings from Last 3 Encounters:  ?02/19/22 124/70  ?01/24/22 119/70  ?01/16/22 124/77  ?  ?Lab Results  ?Component Value Date  ? HGBA1C 5.6 07/13/2010  ?  ? ? ?Recent OV, Consult or  Hospital visit:  ?Recent medication changes indicated:  short course Oxycodone post op toe amputation.  Increase in levothyroxine to daily  ? ? ?Last adherence delivery date:02/02/22     ? ?Patient is due for next adherence delivery on: 03/06/22 ? ?Spoke with patient on 02/26/22 reviewed medications and coordinated delivery. ? ?This delivery to include: Adherence Packaging  30 Days  ?Packs: ?Gabapentin 300mg  - take 1 tablet breakfast, 2 tablets lunch,2 tablets evening meal ?Furosemide 20mg  - take 1 tablet daily breakfast  daily ?Pantoprazole 40mg  - take 1 tablet at breakfast ?Atorvastatin 20mg  - take 1 tablet at breakfast ?Vitamin D3 - take 1 capsule breakfast ?Ferrous sulfate 324 mg - take 1 tablet at breakfast  daily  ?Metoprolol succinate 100mg  - take 1 tablet at bedtime  ?Levothyroxine 112 mcg - take 1 tablet at breakfast ?Amitriptyline 50mg  - take 2 tablets at bedtime ?Divalproex 500mg - take 1 tablet bedtime  ? ?VIAL medications: ?Tizanidine 4mg -take 1 tablet every 8 hours for headache as needed ?Zofran 4mg - take 1 tablet as needed for nausea ? ?Patient declined the following medications this month: ?Ubrelvy-50mg  use as needed ?Nitrostat 0.4mg  SL tablets- supply on hand  ? ?Any concerns about your medications? No ? ?How often do you forget or accidentally miss a dose? Never ? ?Do you use a pillbox? No ? ?Is patient in packaging Yes ? What is the date on your next pill pack? Unable to get to medications to read date, he was just getting in his house.  ? Any concerns or issues with your packaging? No concerns at this time  ? ? ?Refills requested from providers include: atorvastatin, tizanidine,gabapentin  ? ?Confirmed delivery date of 03/06/22, advised patient that pharmacy will contact them the morning of delivery. ? ?Recent blood pressure readings are as follows:none available ? ? ?Annual wellness visit in last year? Yes ?Most Recent BP reading: 124/70  61-P  02/19/22 ? ? ?Al Corpus, CPP  notified ? ?Kaidance Pantoja, CCMA ?Health concierge  ?[phone]  ?

## 2022-02-22 NOTE — Assessment & Plan Note (Signed)
Will be due for rpt colonoscopy next year.  ?

## 2022-02-22 NOTE — Telephone Encounter (Signed)
I've filled out [id number] application form for personal care services for patient.  ?Please have him fill out top portion then fax to [employer] per fax # on sheet.  ?Will place in Lisa's box tomorrow.  ?

## 2022-02-22 NOTE — Assessment & Plan Note (Signed)
Update B12 levels, continue monthly shots latest today  ?

## 2022-02-22 NOTE — Assessment & Plan Note (Signed)
Update levels on levothyroxine 1107mg daily.  ?

## 2022-02-22 NOTE — Assessment & Plan Note (Signed)
Will send in application for personal care servies.  ?

## 2022-02-22 NOTE — Assessment & Plan Note (Signed)
S/p several amputations, latest this year by Dr [name].  ?

## 2022-02-22 NOTE — Assessment & Plan Note (Signed)
Continue lasix '20mg'$  daily as well as toprol XL.  ?

## 2022-02-22 NOTE — Assessment & Plan Note (Signed)
Notes acute worsening lower back pain - he has ortho f/u planned with Dr [name] later this week.  ?This affects ability to complete ADLs.  ?

## 2022-02-22 NOTE — Assessment & Plan Note (Addendum)
This is activity limiting, affects ability to complete ADLs. Will see if patient qualifies for personal care services through medicare - I will fill out [place] form and send to [facility] healthcare.  ?

## 2022-02-22 NOTE — Assessment & Plan Note (Signed)
Stable period on pantoprazole 40mg daily  

## 2022-02-22 NOTE — Assessment & Plan Note (Signed)
Ongoing, managed with gabapentin, amitriptyline, tizanidine.  ?

## 2022-02-22 NOTE — Assessment & Plan Note (Signed)
Update renal panel 

## 2022-02-22 NOTE — Assessment & Plan Note (Addendum)
Update levels on daily oral iron  

## 2022-02-22 NOTE — Progress Notes (Signed)
? ?Office Visit Note ?  ?Patient: Nicholas [name]           ?Date of Birth: [birth year]           ?MRN: [id number] ?Visit Date: 02/22/2022 ?             ?Requested by: [name], MD ?[address] ?[place],  [place] [id number] ?PCP: [name], MD ? ? ?Assessment & Plan: ?Visit Diagnoses:  ?1. Acute midline low back pain, unspecified whether sciatica present   ? ? ?Plan: Impression is chronic left-sided low back pain.  At this point, would like to start patient on a steroid taper muscle relaxer.  I have also discussed that I would like to have him follow-up with Dr. Lorin [name] for further evaluation treatment recommendation.  Call with concerns or questions in the meantime. ? ?Follow-Up Instructions: Return if symptoms worsen or fail to improve.  ? ?Orders:  ?No orders of the defined types were placed in this encounter. ? ?Meds ordered this encounter  ?Medications  ? predniSONE (STERAPRED UNI-PAK 21 TAB) 10 MG (21) TBPK tablet  ?  Sig: Take as directed  ?  Dispense:  21 tablet  ?  Refill:  0  ? methocarbamol (ROBAXIN) 500 MG tablet  ?  Sig: Take 1 tablet (500 mg total) by mouth 2 (two) times daily as needed for muscle spasms.  ?  Dispense:  20 tablet  ?  Refill:  2  ? ? ? ? Procedures: ?No procedures performed ? ? ?Clinical Data: ?No additional findings. ? ? ?Subjective: ?Chief Complaint  ?Patient presents with  ? Lower Back - Pain  ? ? ?HPI patient is a pleasant 71 year old gentleman who comes in today with 3 weeks of left-sided low back pain.  He denies any injury but does note just prior to the onset of his symptoms he underwent surgery with [name] for toe amputation.  Pain he has been having is to the left lower back without any radiation down either leg.  He has slight pressure but no weakness.  Symptoms appear to be worse with standing.  He does note numbness to the left side of his low back but nothing down his legs or in his feet.  He denies any bowel or bladder change or saddle paresthesias.  He  has not taken any medication for the pain.  He does note lumbar surgery by Dr. Lorin [name] back in February 2020.  He did undergo a CT scan of the lumbar spine about a year ago which showed moderate lumbar scoliosis with multilevel disc and facet degeneration resulting in multilevel spinal neural foraminal stenosis. ? ?Review of Systems as detailed in HPI.  All others reviewed and are negative. ? ? ?Objective: ?Vital Signs: There were no vitals taken for this visit. ? ?Physical Exam well-developed well-nourished gentleman in no acute distress.  Alert and oriented x3. ? ?Ortho Exam lumbar spine exam shows no spinous tenderness.  Does have left-sided paraspinous musculature tenderness.  Increased pain with lumbar flexion and extension.  Positive straight leg raise.  No focal weakness.  Is neurovascular tact distally. ? ?Specialty Comments:  ?No specialty comments available. ? ?Imaging: ?No new imaging ? ? ?PMFS History: ?Patient Active Problem List  ? Diagnosis Date Noted  ? Recurrent falls 08/25/2021  ? Memory difficulty 07/04/2021  ? Chronic toe ulcer, left, with fat layer exposed ([place]) 01/23/2021  ? Pituitary adenoma ([place]) 09/06/2020  ? Chronic intractable headache 07/27/2020  ? Vitamin B12 deficiency  03/31/2020  ? Vitamin D deficiency 03/31/2020  ? Numbness of foot 03/25/2020  ? Right hand pain 12/14/2019  ? Status post amputation of great toe, left ([place]) 12/14/2019  ? Callus of foot 04/02/2019  ? Allergy to bee sting 03/11/2019  ? Insomnia 02/10/2019  ? Status post lumbar spine operative procedure for decompression of spinal cord 12/12/2018  ? Dyslipidemia 10/19/2018  ? Foot deformity 10/19/2018  ? Spinal stenosis of lumbar region without neurogenic claudication 10/01/2018  ? Facial rash 05/26/2018  ? Status post reverse total replacement of right shoulder 05/13/2018  ? CHF (congestive heart failure) ([place]) 12/27/2017  ? OSA (obstructive sleep apnea) 12/27/2017  ? Health maintenance examination 11/25/2017  ? CKD  (chronic kidney disease) stage 3, GFR 30-59 ml/min (HCC) 10/01/2017  ? Fatty liver 10/01/2017  ? Status post reverse total replacement of left shoulder 09/03/2017  ? Bilateral shoulder pain 05/24/2017  ? History of CVA in adulthood 03/08/2017  ? S/P total knee arthroplasty, left 01/30/2017  ? Arthritis of knee 01/15/2017  ? Skin rash 12/10/2016  ? Medicare annual wellness visit, subsequent 10/05/2016  ? Advanced care planning/counseling discussion 10/05/2016  ? Obesity, morbid, BMI 40.0-49.9 ([place]) 07/19/2016  ? Hypothyroidism   ? Iron deficiency 04/23/2016  ? Chronic pain syndrome 04/23/2016  ? Inflammatory arthritis 04/23/2016  ? Chronic leg pain 04/23/2016  ? Lesion of palate 04/23/2016  ? Grieving 04/23/2016  ? GERD (gastroesophageal reflux disease)   ? Duodenitis determined by biopsy 02/13/2016  ? Diverticulosis of colon without hemorrhage 01/31/2016  ? Chest pain 01/11/2012  ? Migraine 01/11/2012  ? History of methicillin resistant staphylococcus aureus (MRSA) 07/04/2010  ? DDD (degenerative disc disease), lumbar 07/04/2010  ? History of total right knee replacement 07/04/2010  ? Primary osteoarthritis, right shoulder 06/09/2007  ? Personal history of colonic adenoma 06/01/2003  ? ?Past Medical History:  ?Diagnosis Date  ? Acute kidney failure 08/2008  ? "cleared up"  no problems since  ? Allergy   ? Anxiety   ? Arthritis   ? Asthma   ? ?of this no inhaler  ? Blood transfusion   ? CHF (congestive heart failure) ([place])   ? ?of this, pt denies  ? CLOSTRIDIUM DIFFICILE COLITIS 07/04/2010  ? Annotation: 12/09, 2/10 Qualifier: Diagnosis of  By: [name], [name]    ? Depression with anxiety   ? Duodenitis determined by biopsy 02/2016  ? peptic likely due to aleve (erosive gastropathy with duodenal erosions)  ? ECZEMA 07/04/2010  ? Qualifier: Diagnosis of  By: [name], [name]    ? Elevated liver enzymes   ? Fatty liver   ? Full dentures   ? GERD (gastroesophageal reflux disease)   ? Heart attack ([place])   ? 08/2008  (likely demand ischemia in the setting of MSSA sepsis/R TKA  infection)10-2008  ? History of hiatal hernia   ? History of stomach ulcers   ? Hypothyroidism   ? Lower GI bleeding   ? MALAR AND MAXILLARY BONES CLOSED FRACTURE 07/04/2010  ? Annotation: ORIF Qualifier: Diagnosis of  By: [name], [name]    ? Migraine   ? "definitely"  ? MRSA (methicillin resistant Staphylococcus aureus)   ? in leg, had to place steel rod in leg  ? Personal history of colonic adenoma 06/01/2003  ? PFO (patent foramen ovale)   ? small PFO by 08/2008 TEE  ? Pneumonia 08/2008  ? "while in ICU"  ? Seizures ([place]) 2009  ? "long time ago"    ?  Sleep apnea   ? does not wear CPAP  ? Stroke [facility]) 2010  ? unable to complete sentences at times  ? Wears glasses   ?  ?Family History  ?Problem Relation Age of Onset  ? Coronary artery disease Father 40  ? Stroke Father   ? CAD Mother 39  ?     stents  ? Breast cancer Sister   ? Diabetes Brother   ? Diabetes Sister   ? Colon cancer Neg Hx   ? Stomach cancer Neg Hx   ?  ?Past Surgical History:  ?Procedure Laterality Date  ? AMPUTATION Left 01/24/2022  ? Procedure: LEFT GREAT TOE AMPUTATION AT METATARSOPHALANGEAL JOINT AND SECOND TOE AMPUTATION;  Surgeon: Newt Minion, MD;  Location: [facility];  Service: Orthopedics;  Laterality: Left;  ? anterior  nerve transposition  07/2009  ? left ulnar nerve  ? antibiotic spacer exchange  11/2008; 08/2006  ? right knee  ? ARTHROTOMY  08/2008  ? right knee w/I&D  ? CARDIOVASCULAR STRESS TEST  2013  ? stress EKG - negative for ischemia, 4 min 7.3 METs, normal blood pressure response  ? CATARACT EXTRACTION    ? COLONOSCOPY  11/2012  ? diverticulosis, rpt 10 yrs [name])  ? ESOPHAGOGASTRODUODENOSCOPY  02/2016  ? erosive gastropathy with duodenal erosions [name])  ? HARDWARE REMOVAL  04/2006  ? right knee w/antibiotic spacers placed  ? INGUINAL HERNIA REPAIR  early 1990's  ? bilateral  ? JOINT REPLACEMENT Bilateral   ? KNEE ARTHROSCOPY  10/2001  ? right  ? KNEE FUSION  03/2009   ? right knee removal; antibiotic spacers;   ? LEFT HEART CATH AND CORONARY ANGIOGRAPHY N/A 02/04/2018  ?  WNL (Patwardhan, Reynold Bowen, MD)  ? LUMBAR LAMINECTOMY/DECOMPRESSION MICRODISCECTOMY N/A 12/05/2018  ? Proc

## 2022-02-23 NOTE — Telephone Encounter (Signed)
Lvm asking pt to call back.  Needs to come by office to complete his portion of personal care form.  Then return form to [place].  ? ?[Placed form at front office.] ?

## 2022-02-23 NOTE — Telephone Encounter (Signed)
Patient came into the office to sign paperwork, paperwork placed back in provider's box ?

## 2022-02-23 NOTE — Telephone Encounter (Signed)
Pt called back. I advised him to come by the office before 430. ?

## 2022-02-24 ENCOUNTER — Other Ambulatory Visit: Payer: Self-pay | Admitting: Family Medicine

## 2022-02-24 DIAGNOSIS — E039 Hypothyroidism, unspecified: Secondary | ICD-10-CM

## 2022-02-24 MED ORDER — LEVOTHYROXINE SODIUM 112 MCG PO TABS: 112.0000 ug | ORAL_TABLET | Freq: Every day | ORAL | 3 refills | Status: DC

## 2022-02-26 NOTE — Telephone Encounter (Signed)
Pt needs to complete the "Step 2, Section A:  Beneficiary's Demographic" section of personal care form.  Then return form to [place]. ? ?[Placed form at front office.] ? ?Spoke with pt notifying him of info above.  States he will come back to fill it out.  ?

## 2022-02-28 ENCOUNTER — Ambulatory Visit (INDEPENDENT_AMBULATORY_CARE_PROVIDER_SITE_OTHER): Payer: Medicare Other

## 2022-02-28 DIAGNOSIS — D649 Anemia, unspecified: Secondary | ICD-10-CM | POA: Insufficient documentation

## 2022-02-28 DIAGNOSIS — R300 Dysuria: Secondary | ICD-10-CM | POA: Insufficient documentation

## 2022-02-28 DIAGNOSIS — F339 Major depressive disorder, recurrent, unspecified: Secondary | ICD-10-CM | POA: Insufficient documentation

## 2022-02-28 DIAGNOSIS — Z0000 Encounter for general adult medical examination without abnormal findings: Secondary | ICD-10-CM

## 2022-02-28 DIAGNOSIS — Z8739 Personal history of other diseases of the musculoskeletal system and connective tissue: Secondary | ICD-10-CM | POA: Insufficient documentation

## 2022-02-28 DIAGNOSIS — R519 Headache, unspecified: Secondary | ICD-10-CM | POA: Insufficient documentation

## 2022-02-28 DIAGNOSIS — G8929 Other chronic pain: Secondary | ICD-10-CM | POA: Insufficient documentation

## 2022-02-28 DIAGNOSIS — E669 Obesity, unspecified: Secondary | ICD-10-CM | POA: Insufficient documentation

## 2022-02-28 DIAGNOSIS — M545 Low back pain, unspecified: Secondary | ICD-10-CM | POA: Insufficient documentation

## 2022-02-28 DIAGNOSIS — R609 Edema, unspecified: Secondary | ICD-10-CM | POA: Insufficient documentation

## 2022-02-28 DIAGNOSIS — R600 Localized edema: Secondary | ICD-10-CM | POA: Insufficient documentation

## 2022-02-28 DIAGNOSIS — M79604 Pain in right leg: Secondary | ICD-10-CM | POA: Insufficient documentation

## 2022-02-28 NOTE — Progress Notes (Signed)
Virtual Visit via Telephone Note  I connected with  Nicholas [name] on 02/28/22 at 10:30 AM EDT by telephone and verified that I am speaking with the correct person using two identifiers.  Location: Patient: home Provider: [place] Persons participating in the virtual visit: [place]   I discussed the limitations, risks, security and privacy concerns of performing an evaluation and management service by telephone and the availability of in person appointments. The patient expressed understanding and agreed to proceed.  Interactive audio and video telecommunications were attempted between this nurse and patient, however failed, due to patient having technical difficulties OR patient did not have access to video capability.  We continued and completed visit with audio only.  Some vital signs may be absent or patient reported.   Dionisio David, LPN  Subjective:   Nicholas [name] is a 71 y.o. male who presents for Medicare Annual/Subsequent preventive examination.  Review of Systems           Objective:    There were no vitals filed for this visit. There is no height or weight on file to calculate BMI.     01/24/2022    7:30 AM 03/01/2021    3:03 PM 12/11/2019    2:03 PM 12/10/2018   11:46 AM 12/05/2018    3:00 PM 12/05/2018    7:18 AM 11/18/2018    1:09 PM  Advanced Directives  Does Patient Have a Medical Advance Directive? Yes No No No No No No  Type of Advance Directive [place]        Does patient want to make changes to medical advance directive? No - Patient declined        Copy of [place] in Chart? No - copy requested        Would patient like information on creating a medical advance directive?   No - Patient declined No - Patient declined No - Patient declined No - Patient declined No - Patient declined    Current Medications (verified) Outpatient Encounter Medications as of 02/28/2022  Medication Sig    acetaminophen (TYLENOL) 500 MG tablet Take 1,000 mg by mouth 3 (three) times daily.   amitriptyline (ELAVIL) 50 MG tablet TAKE TWO TABLETS BY MOUTH EVERYDAY AT BEDTIME (Patient taking differently: Take 100 mg by mouth at bedtime.)   atorvastatin (LIPITOR) 20 MG tablet TAKE ONE TABLET BY MOUTH ONCE DAILY   B Complex Vitamins (B-COMPLEX/B-12 PO) Take 1 tablet by mouth daily.   Cholecalciferol (VITAMIN D3) 25 MCG (1000 UT) CAPS Take 1 capsule (1,000 Units total) by mouth daily.   clotrimazole (LOTRIMIN) 1 % cream Apply 1 application topically 2 (two) times daily. To groin (Patient taking differently: Apply 1 application. topically 2 (two) times daily as needed (rash).)   divalproex (DEPAKOTE ER) 500 MG 24 hr tablet Take 1 tablet (500 mg total) by mouth at bedtime.   EPINEPHrine 0.3 mg/0.3 mL IJ SOAJ injection Inject 0.3 mg into the muscle as needed for anaphylaxis. (bee stings)   ferrous sulfate 325 (65 FE) MG EC tablet Take 325 mg by mouth daily with breakfast.   furosemide (LASIX) 20 MG tablet TAKE ONE TABLET BY MOUTH EVERY MORNING (Patient taking differently: Take 20 mg by mouth daily.)   gabapentin (NEURONTIN) 300 MG capsule TAKE ONE CAPSULE BY MOUTH EVERY MORNING and TAKE TWO CAPSULES BY MOUTH AT NOON and TAKE TWO CAPSULES BY MOUTH EVERY EVENING   ketoconazole (NIZORAL) 2 %  shampoo Apply 1 application topically 3 (three) times a week.   levothyroxine (SYNTHROID) 112 MCG tablet Take 1 tablet (112 mcg total) by mouth daily before breakfast.   methocarbamol (ROBAXIN) 500 MG tablet Take 1 tablet (500 mg total) by mouth 2 (two) times daily as needed for muscle spasms.   metoprolol succinate (TOPROL-XL) 100 MG 24 hr tablet Take 1 tablet (100 mg total) by mouth daily. Take with or immediately following a meal.   nitroGLYCERIN (NITROSTAT) 0.4 MG SL tablet Place 1 tablet (0.4 mg total) under the tongue every 5 (five) minutes as needed for chest pain.   ondansetron (ZOFRAN ODT) 4 MG disintegrating tablet  Take 1 tablet (4 mg total) by mouth every 8 (eight) hours as needed. (Patient taking differently: Take 4 mg by mouth every 8 (eight) hours as needed for nausea or vomiting.)   oxyCODONE-acetaminophen (PERCOCET/ROXICET) 5-325 MG tablet Take 1 tablet by mouth every 4 (four) hours as needed.   pantoprazole (PROTONIX) 40 MG tablet TAKE ONE TABLET BY MOUTH ONCE DAILY   predniSONE (STERAPRED UNI-PAK 21 TAB) 10 MG (21) TBPK tablet Take as directed   tiZANidine (ZANAFLEX) 4 MG tablet Take 1 tablet (4 mg total) by mouth every 6 (six) hours as needed (for acute headache treatment).   Ubrogepant (UBRELVY) 50 MG TABS Take 50 mg by mouth daily as needed. (Patient taking differently: Take 50 mg by mouth daily as needed.)   No facility-administered encounter medications on file as of 02/28/2022.    Allergies (verified) Peanut-containing drug products, Yellow jacket venom [bee venom], Celebrex [celecoxib], Percocet [oxycodone-acetaminophen], Latex, and Nsaids   History: Past Medical History:  Diagnosis Date   Acute kidney failure 08/2008   "cleared up"  no problems since   Allergy    Anxiety    Arthritis    Asthma    ?of this no inhaler   Blood transfusion    CHF (congestive heart failure) ([place])    ?of this, pt denies   CLOSTRIDIUM DIFFICILE COLITIS 07/04/2010   Annotation: 12/09, 2/10 Qualifier: Diagnosis of  By: [name], [name]     Depression with anxiety    Duodenitis determined by biopsy 02/2016   peptic likely due to aleve (erosive gastropathy with duodenal erosions)   ECZEMA 07/04/2010   Qualifier: Diagnosis of  By: [name], [name]     Elevated liver enzymes    Fatty liver    Full dentures    GERD (gastroesophageal reflux disease)    Heart attack ([place])    08/2008 (likely demand ischemia in the setting of MSSA sepsis/R TKA  infection)10-2008   History of hiatal hernia    History of stomach ulcers    Hypothyroidism    Lower GI bleeding    MALAR AND MAXILLARY BONES CLOSED FRACTURE  07/04/2010   Annotation: ORIF Qualifier: Diagnosis of  By: [name], [name]     Migraine    "definitely"   MRSA (methicillin resistant Staphylococcus aureus)    in leg, had to place steel rod in leg   Personal history of colonic adenoma 06/01/2003   PFO (patent foramen ovale)    small PFO by 08/2008 TEE   Pneumonia 08/2008   "while in ICU"   Seizures ([place]) 2009   "long time ago"     Sleep apnea    does not wear CPAP   Stroke ([place]) 2010   unable to complete sentences at times   Wears glasses    Past Surgical History:  Procedure Laterality Date  AMPUTATION Left 01/24/2022   Procedure: LEFT GREAT TOE AMPUTATION AT METATARSOPHALANGEAL JOINT AND SECOND TOE AMPUTATION;  Surgeon: Newt Minion, MD;  Location: [facility];  Service: Orthopedics;  Laterality: Left;   anterior  nerve transposition  07/2009   left ulnar nerve   antibiotic spacer exchange  11/2008; 08/2006   right knee   ARTHROTOMY  08/2008   right knee w/I&D   CARDIOVASCULAR STRESS TEST  2013   stress EKG - negative for ischemia, 4 min 7.3 METs, normal blood pressure response   CATARACT EXTRACTION     COLONOSCOPY  11/2012   diverticulosis, rpt 10 yrs [name])   ESOPHAGOGASTRODUODENOSCOPY  02/2016   erosive gastropathy with duodenal erosions [name])   HARDWARE REMOVAL  04/2006   right knee w/antibiotic spacers placed   INGUINAL HERNIA REPAIR  early 1990's   bilateral   JOINT REPLACEMENT Bilateral    KNEE ARTHROSCOPY  10/2001   right   KNEE FUSION  03/2009   right knee removal; antibiotic spacers;    LEFT HEART CATH AND CORONARY ANGIOGRAPHY N/A 02/04/2018    WNL (Patwardhan, Reynold Bowen, MD)   LUMBAR LAMINECTOMY/DECOMPRESSION MICRODISCECTOMY N/A 12/05/2018   Procedure: LUMBAR THREE TO FOUR AND LUMBAR FOUR TO FIVE DECOMPRESSION;  Surgeon: [name], MD;  Location: [facility];  Service: Orthopedics;  Laterality: N/A;   MULTIPLE TOOTH EXTRACTIONS     REPLACEMENT TOTAL KNEE  08/2008; 09/2001   right   REVERSE SHOULDER  ARTHROPLASTY Left 09/03/2017   Procedure: REVERSE SHOULDER ARTHROPLASTY;  Surgeon: Meredith Pel, MD;  Location: [facility] [place];  Service: Orthopedics;  Laterality: Left;   REVERSE SHOULDER ARTHROPLASTY Right 05/13/2018   Procedure: RIGHT REVERSE SHOULDER ARTHROPLASTY;  Surgeon: Meredith Pel, MD;  Location: [facility];  Service: Orthopedics;  Laterality: Right;   rod placement Right 03/2009   R knee   SYNOVECTOMY  06/2005   debridement, liner exchange right knee   TOE AMPUTATION Left 08/2008   great toe - osteomyelitis (staph infection)   TOE AMPUTATION Left 04/2016   2nd toe [name]   TONSILLECTOMY     TOTAL KNEE ARTHROPLASTY Left 01/15/2017   TOTAL KNEE ARTHROPLASTY Left 01/15/2017   Procedure: TOTAL KNEE ARTHROPLASTY;  Surgeon: Meredith Pel, MD;  Location: [facility];  Service: Orthopedics;  Laterality: Left;   Family History  Problem Relation Age of Onset   Coronary artery disease Father 35   Stroke Father    CAD Mother 60       stents   Breast cancer Sister    Diabetes Brother    Diabetes Sister    Colon cancer Neg Hx    Stomach cancer Neg Hx    Social History   Socioeconomic History   Marital status: Widowed    Spouse name: Not on file   Number of children: 2   Years of education: 53   Highest education level: Not on file  Occupational History   Occupation: retired  Tobacco Use   Smoking status: Never    Passive exposure: Past   Smokeless tobacco: Never  Vaping Use   Vaping Use: Never used  Substance and Sexual Activity   Alcohol use: No    Alcohol/week: 0.0 standard drinks    Comment: "I abused alcohol; last drink  ~ 2005"   Drug use: No   Sexual activity: Never  Other Topics Concern   Not on file  Social History Narrative   Widower 2016 - wife passed from ESRD related illness  Lives in [place] in [place].    M[employer]on      Screened positive for OSA 04/2018   Social Determinants of Health   Financial Resource Strain: Low Risk    Difficulty  of Paying Living Expenses: Not very hard  Food Insecurity: Not on file  Transportation Needs: Not on file  Physical Activity: Not on file  Stress: Not on file  Social Connections: Not on file    Tobacco Counseling Counseling given: Not Answered   Clinical Intake:  Pre-visit preparation completed: Yes  Pain : No/denies pain     Diabetes: No  How often do you need to have someone help you when you read instructions, pamphlets, or other written materials from your doctor or pharmacy?: 1 - Never  Diabetic?no  Interpreter Needed?: No  Information entered by :: Kirke Shaggy, LPN   Activities of Daily Living    01/24/2022    7:36 AM 01/24/2022    7:34 AM  In your present state of health, do you have any difficulty performing the following activities:  Hearing?  0  Vision?  0  Difficulty concentrating or making decisions?  0  Walking or climbing stairs?  0  Dressing or bathing?  0  Doing errands, shopping? 0     Patient Care Team: [name], MD as PCP - General (Family Medicine) Adrian Prows, MD (Cardiology) [name], [facility] as Pharmacist (Pharmacist)  Indicate any recent Medical Services you may have received from other than [facility] providers in the past year (date may be approximate).     Assessment:   This is a routine wellness examination for Nicholas [name].  Hearing/Vision screen No results found.  Dietary issues and exercise activities discussed:     Goals Addressed   None    Depression Screen    02/19/2022   12:22 PM 02/14/2021    2:44 PM 12/11/2019    2:05 PM 12/10/2018   11:47 AM 11/18/2017   11:31 AM 10/05/2016    3:51 PM 02/08/2016   10:03 AM  PHQ 2/9 Scores  PHQ - 2 Score 0 0 0 0 0 3 0  PHQ- 9 Score 3  0 0 0 8     Fall Risk    02/19/2022   11:32 AM 02/14/2021    2:44 PM 12/11/2019    2:04 PM 12/10/2018   11:47 AM 11/18/2017   11:31 AM  Fall Risk   Falls in the past year? 1  1 0 Yes  Comment   tripped and fell  lost balance and fell  Number  falls in past yr: 1 1 0  1  Injury with Fall? 0 0 0  No  Risk for fall due to :   Medication side effect;Impaired mobility    Follow up   Falls evaluation completed;Falls prevention discussed      FALL RISK PREVENTION PERTAINING TO THE HOME:  Any stairs in or around the home? No  If so, are there any without handrails? No  Home free of loose throw rugs in walkways, pet beds, electrical cords, etc? Yes  Adequate lighting in your home to reduce risk of falls? Yes   ASSISTIVE DEVICES UTILIZED TO PREVENT FALLS:  Life alert? Yes  Use of a cane, walker or w/c? Yes  Grab bars in the bathroom? Yes  Shower chair or bench in shower? No  Elevated toilet seat or a handicapped toilet? Yes   Cognitive Function: 0points 6CIT      12/11/2019  2:07 PM 12/10/2018   11:46 AM 11/18/2017   11:49 AM  MMSE - Mini Mental State Exam  Orientation to time '5 5 5  '$ Orientation to Place '5 5 5  '$ Registration '3 3 3  '$ Attention/ Calculation 5 0 0  Recall '3 3 2  '$ Recall-comments   unable to recall 1 of 3 words  Language- name 2 objects  0 0  Language- repeat '1 1 1  '$ Language- follow 3 step command  3 3  Language- read & follow direction  0 0  Write a sentence  0 0  Copy design  0 0  Total score  20 19        Immunizations Immunization History  Administered Date(s) Administered   Influenza, High Dose Seasonal PF 08/15/2017   Influenza,inj,Quad PF,6+ Mos 07/01/2013   Influenza-Unspecified 08/16/2011, 07/18/2016, 06/03/2018   Pneumococcal Conjugate-13 10/05/2016   Pneumococcal Polysaccharide-23 08/16/2011, 02/14/2021   Td 01/30/2006   Zoster Recombinat (Shingrix) 12/12/2017, 04/22/2018   Zoster, Live 10/15/2012    TDAP status: Due, Education has been provided regarding the importance of this vaccine. Advised may receive this vaccine at local pharmacy or Health Dept. Aware to provide a copy of the vaccination record if obtained from local pharmacy or Health Dept. Verbalized acceptance and  understanding.  Flu Vaccine status: Declined, Education has been provided regarding the importance of this vaccine but patient still declined. Advised may receive this vaccine at local pharmacy or Health Dept. Aware to provide a copy of the vaccination record if obtained from local pharmacy or Health Dept. Verbalized acceptance and understanding.  Pneumococcal vaccine status: Up to date  Covid-19 vaccine status: Declined, Education has been provided regarding the importance of this vaccine but patient still declined. Advised may receive this vaccine at local pharmacy or Health Dept.or vaccine clinic. Aware to provide a copy of the vaccination record if obtained from local pharmacy or Health Dept. Verbalized acceptance and understanding.  Qualifies for Shingles Vaccine? Yes   Zostavax completed Yes   Shingrix Completed?: Yes  Screening Tests Health Maintenance  Topic Date Due   COVID-19 Vaccine (1) Never done   TETANUS/TDAP  01/31/2016   INFLUENZA VACCINE  05/15/2022   COLONOSCOPY (Pts 45-22yr Insurance coverage will need to be confirmed)  12/11/2022   Pneumonia Vaccine 71 Years old  Completed   Hepatitis C Screening  Completed   Zoster Vaccines- Shingrix  Completed   HPV VACCINES  Aged Out    Health Maintenance  Health Maintenance Due  Topic Date Due   COVID-19 Vaccine (1) Never done   TETANUS/TDAP  01/31/2016    Colorectal cancer screening: Type of screening: Colonoscopy. Completed 12/11/12. Repeat every 10 years  Lung Cancer Screening: (Low Dose CT Chest recommended if Age 71-80years, 30 pack-year currently smoking OR have quit w/in 15years.) does not qualify.   Additional Screening:  Hepatitis C Screening: does qualify; Completed 04/12/16  Vision Screening: Recommended annual ophthalmology exams for early detection of glaucoma and other disorders of the eye. Is the patient up to date with their annual eye exam?  Yes  Who is the provider or what is the name of the office  in which the patient attends annual eye exams? Dr.Shapiro If pt is not established with a provider, would they like to be referred to a provider to establish care? No .   Dental Screening: Recommended annual dental exams for proper oral hygiene  Community Resource Referral / Chronic Care Management: CRR required this visit?  No  CCM required this visit?  No      Plan:     I have personally reviewed and noted the following in the patient's chart:   Medical and social history Use of alcohol, tobacco or illicit drugs  Current medications and supplements including opioid prescriptions. Patient is currently taking opioid prescriptions. Information provided to patient regarding non-opioid alternatives. Patient advised to discuss non-opioid treatment plan with their provider. Functional ability and status Nutritional status Physical activity Advanced directives List of other physicians Hospitalizations, surgeries, and ER visits in previous 12 months Vitals Screenings to include cognitive, depression, and falls Referrals and appointments  In addition, I have reviewed and discussed with patient certain preventive protocols, quality metrics, and best practice recommendations. A written personalized care plan for preventive services as well as general preventive health recommendations were provided to patient.     Dionisio David, LPN   03/14/1039   Nurse Notes: none

## 2022-02-28 NOTE — Patient Instructions (Signed)
Nicholas [name] , ?Thank you for taking time to come for your Medicare Wellness Visit. I appreciate your ongoing commitment to your health goals. Please review the following plan we discussed and let me know if I can assist you in the future.  ? ?Screening recommendations/referrals: ?Colonoscopy: 12/11/12 ?Recommended yearly ophthalmology/optometry visit for glaucoma screening and checkup ?Recommended yearly dental visit for hygiene and checkup ? ?Vaccinations: ?Influenza vaccine: n/d ?Pneumococcal vaccine: 02/14/21 ?Tdap vaccine: 01/30/06 ?Shingles vaccine: Zostavax 10/15/12  Shingrix 12/12/17, 04/22/18   ?Covid-19: n/d ? ?Advanced directives: no ? ?Conditions/risks identified: none ? ?Next appointment: Follow up in one year for your annual wellness visit. 03/04/23 @ 10:45am by phone ? ?Preventive Care 18 Years and Older, Male ?Preventive care refers to lifestyle choices and visits with your health care provider that can promote health and wellness. ?What does preventive care include? ?A yearly physical exam. This is also called an annual well check. ?Dental exams once or twice a year. ?Routine eye exams. Ask your health care provider how often you should have your eyes checked. ?Personal lifestyle choices, including: ?Daily care of your teeth and gums. ?Regular physical activity. ?Eating a healthy diet. ?Avoiding tobacco and drug use. ?Limiting alcohol use. ?Practicing safe sex. ?Taking low doses of aspirin every day. ?Taking vitamin and mineral supplements as recommended by your health care provider. ?What happens during an annual well check? ?The services and screenings done by your health care provider during your annual well check will depend on your age, overall health, lifestyle risk factors, and family history of disease. ?Counseling  ?Your health care provider may ask you questions about your: ?Alcohol use. ?Tobacco use. ?Drug use. ?Emotional well-being. ?Home and relationship well-being. ?Sexual activity. ?Eating  habits. ?History of falls. ?Memory and ability to understand (cognition). ?Work and work Statistician. ?Screening  ?You may have the following tests or measurements: ?Height, weight, and BMI. ?Blood pressure. ?Lipid and cholesterol levels. These may be checked every 5 years, or more frequently if you are over 68 years old. ?Skin check. ?Lung cancer screening. You may have this screening every year starting at age 23 if you have a 30-pack-year history of smoking and currently smoke or have quit within the past 15 years. ?Fecal occult blood test (FOBT) of the stool. You may have this test every year starting at age 20. ?Flexible sigmoidoscopy or colonoscopy. You may have a sigmoidoscopy every 5 years or a colonoscopy every 10 years starting at age 55. ?Prostate cancer screening. Recommendations will vary depending on your family history and other risks. ?Hepatitis C blood test. ?Hepatitis B blood test. ?Sexually transmitted disease (STD) testing. ?Diabetes screening. This is done by checking your blood sugar (glucose) after you have not eaten for a while (fasting). You may have this done every 1-3 years. ?Abdominal aortic aneurysm (AAA) screening. You may need this if you are a current or former smoker. ?Osteoporosis. You may be screened starting at age 36 if you are at high risk. ?Talk with your health care provider about your test results, treatment options, and if necessary, the need for more tests. ?Vaccines  ?Your health care provider may recommend certain vaccines, such as: ?Influenza vaccine. This is recommended every year. ?Tetanus, diphtheria, and acellular pertussis (Tdap, Td) vaccine. You may need a Td booster every 10 years. ?Zoster vaccine. You may need this after age 59. ?Pneumococcal 13-valent conjugate (PCV13) vaccine. One dose is recommended after age 63. ?Pneumococcal polysaccharide (PPSV23) vaccine. One dose is recommended after age 86. ?Talk to your  health care provider about which screenings and  vaccines you need and how often you need them. ?This information is not intended to replace advice given to you by your health care provider. Make sure you discuss any questions you have with your health care provider. ?Document Released: 10/28/2015 Document Revised: 06/20/2016 Document Reviewed: 08/02/2015 ?Elsevier Interactive Patient Education ? 2017 [place]. ? ?Fall Prevention in the Home ?Falls can cause injuries. They can happen to people of all ages. There are many things you can do to make your home safe and to help prevent falls. ?What can I do on the outside of my home? ?Regularly fix the edges of walkways and driveways and fix any cracks. ?Remove anything that might make you trip as you walk through a door, such as a raised step or threshold. ?Trim any bushes or trees on the path to your home. ?Use bright outdoor lighting. ?Clear any walking paths of anything that might make someone trip, such as rocks or tools. ?Regularly check to see if handrails are loose or broken. Make sure that both sides of any steps have handrails. ?Any raised decks and porches should have guardrails on the edges. ?Have any leaves, snow, or ice cleared regularly. ?Use sand or salt on walking paths during winter. ?Clean up any spills in your garage right away. This includes oil or grease spills. ?What can I do in the bathroom? ?Use night lights. ?Install grab bars by the toilet and in the tub and shower. Do not use towel bars as grab bars. ?Use non-skid mats or decals in the tub or shower. ?If you need to sit down in the shower, use a plastic, non-slip stool. ?Keep the floor dry. Clean up any water that spills on the floor as soon as it happens. ?Remove soap buildup in the tub or shower regularly. ?Attach bath mats securely with double-sided non-slip rug tape. ?Do not have throw rugs and other things on the floor that can make you trip. ?What can I do in the bedroom? ?Use night lights. ?Make sure that you have a light by your  bed that is easy to reach. ?Do not use any sheets or blankets that are too big for your bed. They should not hang down onto the floor. ?Have a firm chair that has side arms. You can use this for support while you get dressed. ?Do not have throw rugs and other things on the floor that can make you trip. ?What can I do in the kitchen? ?Clean up any spills right away. ?Avoid walking on wet floors. ?Keep items that you use a lot in easy-to-reach places. ?If you need to reach something above you, use a strong step stool that has a grab bar. ?Keep electrical cords out of the way. ?Do not use floor polish or wax that makes floors slippery. If you must use wax, use non-skid floor wax. ?Do not have throw rugs and other things on the floor that can make you trip. ?What can I do with my stairs? ?Do not leave any items on the stairs. ?Make sure that there are handrails on both sides of the stairs and use them. Fix handrails that are broken or loose. Make sure that handrails are as long as the stairways. ?Check any carpeting to make sure that it is firmly attached to the stairs. Fix any carpet that is loose or worn. ?Avoid having throw rugs at the top or bottom of the stairs. If you do have throw rugs, attach them  to the floor with carpet tape. ?Make sure that you have a light switch at the top of the stairs and the bottom of the stairs. If you do not have them, ask someone to add them for you. ?What else can I do to help prevent falls? ?Wear shoes that: ?Do not have high heels. ?Have rubber bottoms. ?Are comfortable and fit you well. ?Are closed at the toe. Do not wear sandals. ?If you use a stepladder: ?Make sure that it is fully opened. Do not climb a closed stepladder. ?Make sure that both sides of the stepladder are locked into place. ?Ask someone to hold it for you, if possible. ?Clearly mark and make sure that you can see: ?Any grab bars or handrails. ?First and last steps. ?Where the edge of each step is. ?Use tools that  help you move around (mobility aids) if they are needed. These include: ?Canes. ?Walkers. ?Scooters. ?Crutches. ?Turn on the lights when you go into a dark area. Replace any light bulbs as soon as they burn o

## 2022-03-01 ENCOUNTER — Other Ambulatory Visit: Payer: Self-pay | Admitting: Family Medicine

## 2022-03-01 DIAGNOSIS — K2960 Other gastritis without bleeding: Secondary | ICD-10-CM

## 2022-03-01 DIAGNOSIS — K269 Duodenal ulcer, unspecified as acute or chronic, without hemorrhage or perforation: Secondary | ICD-10-CM

## 2022-03-05 ENCOUNTER — Ambulatory Visit (INDEPENDENT_AMBULATORY_CARE_PROVIDER_SITE_OTHER): Payer: Medicare Other | Admitting: Orthopedic Surgery

## 2022-03-05 DIAGNOSIS — Z89412 Acquired absence of left great toe: Secondary | ICD-10-CM

## 2022-03-13 NOTE — Addendum Note (Signed)
Addended by: Aviva Signs M on: 03/13/2022 09:50 AM   Modules accepted: Level of Service

## 2022-03-13 NOTE — Progress Notes (Signed)
AWV completed on 02/19/2022 by PCP.  No Charge

## 2022-03-20 ENCOUNTER — Ambulatory Visit: Payer: Self-pay

## 2022-03-20 ENCOUNTER — Encounter: Payer: Self-pay | Admitting: Orthopedic Surgery

## 2022-03-20 ENCOUNTER — Ambulatory Visit (INDEPENDENT_AMBULATORY_CARE_PROVIDER_SITE_OTHER): Payer: Medicare Other | Admitting: Orthopaedic Surgery

## 2022-03-20 ENCOUNTER — Encounter: Payer: Self-pay | Admitting: Orthopaedic Surgery

## 2022-03-20 VITALS — BP 119/75 | HR 70 | Ht 68.0 in | Wt 266.0 lb

## 2022-03-20 DIAGNOSIS — Z9889 Other specified postprocedural states: Secondary | ICD-10-CM

## 2022-03-20 DIAGNOSIS — M5442 Lumbago with sciatica, left side: Secondary | ICD-10-CM | POA: Diagnosis not present

## 2022-03-20 DIAGNOSIS — M5441 Lumbago with sciatica, right side: Secondary | ICD-10-CM | POA: Diagnosis not present

## 2022-03-20 NOTE — Progress Notes (Signed)
Office Visit Note   Patient: Nicholas [name]           Date of Birth: June 03, 1951           MRN: [id number] Visit Date: 03/20/2022              Requested by: [name], MD [address] [place],  [place] [address] PCP: [name], MD   Assessment & Plan: Visit Diagnoses: History of excision of lamina of lumbar vertebrae for decompression of spinal cord   Plan: We discussed some gradual weight loss will help unload his back.  He has had diffuse knee on the right knees uses rolling walker to help prevent falling.  Previous neurogenic claudication symptoms improved with the surgery 2020.  CT scan last year showed postop changes but no recurrent stenosis.  Follow-up as needed.  Follow-Up Instructions: No follow-ups on file.   Orders:  Orders Placed This Encounter  Procedures   XR Lumbar Spine 2-3 Views   No orders of the defined types were placed in this encounter.     Procedures: No procedures performed   Clinical Data: No additional findings.   Subjective: Chief Complaint  Patient presents with   Lower Back - Pain    HPI 71 year old male returns he has had some increased back discomfort for the last month.  He had a fall recently when he did not have his walker in front of him.  Previous knee fusion on the right he has had some toe amputations with [name].  Lumbar curvature present.  He has some Neurontin that he takes occasionally whenever his back is bothering him and it seems to help.  No bowel bladder associated symptoms.  Review of Systems all other systems updated unchanged from previous visit.  History of CVA heart failure not symptomatic currently.   Objective: Vital Signs: BP 119/75   Pulse 70   Ht '5\' 8"'$  (1.727 m)   Wt 266 lb (120.7 kg)   BMI 40.45 kg/m   Physical Exam Constitutional:      Appearance: He is well-developed.  HENT:     Head: Normocephalic and atraumatic.     Right Ear: External ear normal.     Left Ear: External  ear normal.  Eyes:     Pupils: Pupils are equal, round, and reactive to light.  Neck:     Thyroid: No thyromegaly.     Trachea: No tracheal deviation.  Cardiovascular:     Rate and Rhythm: Normal rate.  Pulmonary:     Effort: Pulmonary effort is normal.     Breath sounds: No wheezing.  Abdominal:     General: Bowel sounds are normal.     Palpations: Abdomen is soft.  Musculoskeletal:     Cervical back: Neck supple.  Skin:    General: Skin is warm and dry.     Capillary Refill: Capillary refill takes less than 2 seconds.  Neurological:     Mental Status: He is alert and oriented to person, place, and time.  Psychiatric:        Behavior: Behavior normal.        Thought Content: Thought content normal.        Judgment: Judgment normal.    Ortho Exam post reverse shoulder 2019.  Well-healed left total knee arthroplasty 2018.  Anterior tib gastrocsoleus is active right and left.  He is amatory with a rolling walker with reverse seat.  Fused right knee.  Specialty Comments:  No specialty comments available.  Imaging: No results found.   PMFS History: Patient Active Problem List   Diagnosis Date Noted   Hx of excision of lamina of lumbar vertebra for decompression of spinal cord 03/20/2022   Anemia 02/28/2022   Dysuria 02/28/2022   History of osteoarthritis 02/28/2022   Peripheral edema 02/28/2022   Recurrent major depressive disorder ([place]) 02/28/2022   Low back pain 02/28/2022   Right leg pain 02/28/2022   Chronic headache 02/28/2022   Obesity 02/28/2022   Recurrent falls 08/25/2021   Memory difficulty 07/04/2021   Chronic toe ulcer, left, with fat layer exposed ([place]) 01/23/2021   Pituitary adenoma ([place]) 09/06/2020   Chronic intractable headache 07/27/2020   Vitamin B12 deficiency 03/31/2020   Vitamin D deficiency 03/31/2020   Numbness of foot 03/25/2020   Right hand pain 12/14/2019   Status post amputation of great toe, left ([place]) 12/14/2019   Callus of foot  04/02/2019   Allergy to bee sting 03/11/2019   Insomnia 02/10/2019   Status post lumbar spine operative procedure for decompression of spinal cord 12/12/2018   Dyslipidemia 10/19/2018   Foot deformity 10/19/2018   Facial rash 05/26/2018   Status post reverse total replacement of right shoulder 05/13/2018   CHF (congestive heart failure) ([place]) 12/27/2017   OSA (obstructive sleep apnea) 12/27/2017   Health maintenance examination 11/25/2017   CKD (chronic kidney disease) stage 3, GFR 30-59 ml/min (HCC) 10/01/2017   Fatty liver 10/01/2017   Status post reverse total replacement of left shoulder 09/03/2017   Bilateral shoulder pain 05/24/2017   History of CVA in adulthood 03/08/2017   S/P total knee arthroplasty, left 01/30/2017   Arthritis of knee 01/15/2017   Skin rash 12/10/2016   Medicare annual wellness visit, subsequent 10/05/2016   Advanced care planning/counseling discussion 10/05/2016   Obesity, morbid, BMI 40.0-49.9 ([place]) 07/19/2016   Hypothyroidism    Iron deficiency 04/23/2016   Chronic pain syndrome 04/23/2016   Inflammatory arthritis 04/23/2016   Chronic leg pain 04/23/2016   Lesion of palate 04/23/2016   Grieving 04/23/2016   GERD (gastroesophageal reflux disease)    Duodenitis determined by biopsy 02/13/2016   Diverticulosis of colon without hemorrhage 24/58/0998   Complicated grief 33/82/5053   Chest pain 01/11/2012   Migraine 01/11/2012   History of methicillin resistant staphylococcus aureus (MRSA) 07/04/2010   DDD (degenerative disc disease), lumbar 07/04/2010   History of total right knee replacement 07/04/2010   Contact dermatitis 07/04/2010   Lesion of ulnar nerve 07/04/2010   Closed fracture of malar bone ([place]) 07/04/2010   Methicillin susceptible Staphylococcus aureus infection 07/04/2010   Narrowing of intervertebral disc space 07/04/2010   Olecranon bursitis 07/04/2010   History of artificial joint 07/04/2010   Primary osteoarthritis, right shoulder  06/09/2007   Osteoarthritis 06/09/2007   Personal history of colonic adenoma 06/01/2003   History of colonic polyps 06/01/2003   Past Medical History:  Diagnosis Date   Acute kidney failure 08/2008   "cleared up"  no problems since   Allergy    Anxiety    Arthritis    Asthma    ?of this no inhaler   Blood transfusion    CHF (congestive heart failure) ([facility])    ?of this, pt denies   CLOSTRIDIUM DIFFICILE COLITIS 07/04/2010   Annotation: 12/09, 2/10 Qualifier: Diagnosis of  By: [name], [name]     Depression with anxiety    Duodenitis determined by biopsy 02/2016   peptic likely due to aleve (erosive gastropathy with  duodenal erosions)   ECZEMA 07/04/2010   Qualifier: Diagnosis of  By: [name], [name]     Elevated liver enzymes    Fatty liver    Full dentures    GERD (gastroesophageal reflux disease)    Heart attack ([facility])    08/2008 (likely demand ischemia in the setting of MSSA sepsis/R TKA  infection)10-2008   History of hiatal hernia    History of stomach ulcers    Hypothyroidism    Lower GI bleeding    MALAR AND MAXILLARY BONES CLOSED FRACTURE 07/04/2010   Annotation: ORIF Qualifier: Diagnosis of  By: [name], [name]     Migraine    "definitely"   MRSA (methicillin resistant Staphylococcus aureus)    in leg, had to place steel rod in leg   Personal history of colonic adenoma 06/01/2003   PFO (patent foramen ovale)    small PFO by 08/2008 TEE   Pneumonia 08/2008   "while in ICU"   Seizures ([place]) 2009   "long time ago"     Sleep apnea    does not wear CPAP   Stroke ([facility]) 2010   unable to complete sentences at times   Wears glasses     Family History  Problem Relation Age of Onset   Coronary artery disease Father 20   Stroke Father    CAD Mother 31       stents   Breast cancer Sister    Diabetes Brother    Diabetes Sister    Colon cancer Neg Hx    Stomach cancer Neg Hx     Past Surgical History:  Procedure Laterality Date   AMPUTATION Left  01/24/2022   Procedure: LEFT GREAT TOE AMPUTATION AT METATARSOPHALANGEAL JOINT AND SECOND TOE AMPUTATION;  Surgeon: Newt Minion, MD;  Location: [facility];  Service: Orthopedics;  Laterality: Left;   anterior  nerve transposition  07/2009   left ulnar nerve   antibiotic spacer exchange  11/2008; 08/2006   right knee   ARTHROTOMY  08/2008   right knee w/I&D   CARDIOVASCULAR STRESS TEST  2013   stress EKG - negative for ischemia, 4 min 7.3 METs, normal blood pressure response   CATARACT EXTRACTION     COLONOSCOPY  11/2012   diverticulosis, rpt 10 yrs [name])   ESOPHAGOGASTRODUODENOSCOPY  02/2016   erosive gastropathy with duodenal erosions [name])   HARDWARE REMOVAL  04/2006   right knee w/antibiotic spacers placed   INGUINAL HERNIA REPAIR  early 1990's   bilateral   JOINT REPLACEMENT Bilateral    KNEE ARTHROSCOPY  10/2001   right   KNEE FUSION  03/2009   right knee removal; antibiotic spacers;    LEFT HEART CATH AND CORONARY ANGIOGRAPHY N/A 02/04/2018    WNL (Patwardhan, Reynold Bowen, MD)   LUMBAR LAMINECTOMY/DECOMPRESSION MICRODISCECTOMY N/A 12/05/2018   Procedure: LUMBAR THREE TO FOUR AND LUMBAR FOUR TO FIVE DECOMPRESSION;  Surgeon: [name], MD;  Location: [facility];  Service: Orthopedics;  Laterality: N/A;   MULTIPLE TOOTH EXTRACTIONS     REPLACEMENT TOTAL KNEE  08/2008; 09/2001   right   REVERSE SHOULDER ARTHROPLASTY Left 09/03/2017   Procedure: REVERSE SHOULDER ARTHROPLASTY;  Surgeon: Meredith Pel, MD;  Location: [facility];  Service: Orthopedics;  Laterality: Left;   REVERSE SHOULDER ARTHROPLASTY Right 05/13/2018   Procedure: RIGHT REVERSE SHOULDER ARTHROPLASTY;  Surgeon: Meredith Pel, MD;  Location: [facility];  Service: Orthopedics;  Laterality: Right;   rod placement Right 03/2009   R  knee   SYNOVECTOMY  06/2005   debridement, liner exchange right knee   TOE AMPUTATION Left 08/2008   great toe - osteomyelitis (staph infection)   TOE AMPUTATION Left 04/2016   2nd toe [name]    TONSILLECTOMY     TOTAL KNEE ARTHROPLASTY Left 01/15/2017   TOTAL KNEE ARTHROPLASTY Left 01/15/2017   Procedure: TOTAL KNEE ARTHROPLASTY;  Surgeon: Meredith Pel, MD;  Location: [facility];  Service: Orthopedics;  Laterality: Left;   Social History   Occupational History   Occupation: retired  Tobacco Use   Smoking status: Never    Passive exposure: Past   Smokeless tobacco: Never  Vaping Use   Vaping Use: Never used  Substance and Sexual Activity   Alcohol use: No    Alcohol/week: 0.0 standard drinks    Comment: "I abused alcohol; last drink  ~ 2005"   Drug use: No   Sexual activity: Never

## 2022-03-20 NOTE — Progress Notes (Signed)
Office Visit Note   Patient: Nicholas [name]           Date of Birth: Aug 04, 1951           MRN: [id number] Visit Date: 03/05/2022              Requested by: [name], MD [address] [place],  [place] [address] PCP: [name], MD  Chief Complaint  Patient presents with   Left Foot - Routine Post Op    01/24/2022 left GT amputation at MTP joint and 2nd toe amputation       HPI: Patient is a 71 year old man who is 6 weeks status post left great toe and second toe amputation he is in regular shoewear has no concerns.  Assessment & Plan: Visit Diagnoses:  1. Left great toe amputee ([place])     Plan: Work on Achilles stretching extra-depth shoes and orthotics.  Follow-Up Instructions: Return if symptoms worsen or fail to improve.   Ortho Exam  Patient is alert, oriented, no adenopathy, well-dressed, normal affect, normal respiratory effort. Examination the right foot has no ulcers he does have some onychomycosis of the nails.  The incision is well-healed no redness cellulitis or drainage.  Imaging: No results found. No images are attached to the encounter.  Labs: Lab Results  Component Value Date   HGBA1C 5.6 07/13/2010   HGBA1C  12/08/2008    5.2 (NOTE)   The ADA recommends the following therapeutic goal for glycemic   control related to Hgb A1C measurement:   Goal of Therapy:   < 7.0% Hgb A1C   Reference: American Diabetes Association: Clinical Practice   Recommendations 2008, Diabetes Care,  2008, 31:(Suppl 1).   HGBA1C  11/23/2008    5.1 (NOTE)   The ADA recommends the following therapeutic goal for glycemic   control related to Hgb A1C measurement:   Goal of Therapy:   < 7.0% Hgb A1C   Reference: American Diabetes Association: Clinical Practice   Recommendations 2008, Diabetes Care,  2008, 31:(Suppl 1).   ESRSEDRATE 28 (H) 03/01/2021   ESRSEDRATE 18 08/03/2020   ESRSEDRATE 22 (H) 01/11/2012   LABURIC 8.8 (H) 03/01/2021   LABURIC 5.0  02/08/2016   REPTSTATUS 05/07/2018 FINAL 05/05/2018   GRAMSTAIN  03/29/2009    NO WBC SEEN NO SQUAMOUS EPITHELIAL CELLS SEEN NO ORGANISMS SEEN   GRAMSTAIN  03/29/2009    NO WBC SEEN NO SQUAMOUS EPITHELIAL CELLS SEEN NO ORGANISMS SEEN   CULT (A) 05/05/2018    <10,000 COLONIES/mL INSIGNIFICANT GROWTH Performed at [facility], [place] [address], [place], [place] [address]    LABORGA STAPHYLOCOCCUS SPECIES (COAGULASE NEGATIVE) 11/21/2008     Lab Results  Component Value Date   ALBUMIN 4.2 02/19/2022   ALBUMIN 4.6 01/16/2022   ALBUMIN 4.2 07/04/2021    Lab Results  Component Value Date   MG 2.1 04/12/2016   MG 1.8 03/31/2009   MG 1.7 03/29/2009   Lab Results  Component Value Date   VD25OH 48.88 02/19/2022   VD25OH 40.71 07/04/2021   VD25OH 25.83 (L) 12/08/2020    No results found for: PREALBUMIN    Latest Ref Rng & Units 02/19/2022   12:19 PM 01/16/2022    2:15 PM 03/01/2021    3:07 PM  CBC EXTENDED  WBC 4.0 - 10.5 K/uL 7.4   8.4   7.9    RBC 4.22 - 5.81 Mil/uL 4.44   4.93   4.40    Hemoglobin  13.0 - 17.0 g/dL 14.6   15.8   14.2    HCT 39.0 - 52.0 % 43.3   45.5   42.8    Platelets 150.0 - 400.0 K/uL 189.0   213   201    NEUT# 1.4 - 7.7 K/uL 4.4   5.1     Lymph# 0.7 - 4.0 K/uL 1.6   1.6        There is no height or weight on file to calculate BMI.  Orders:  No orders of the defined types were placed in this encounter.  No orders of the defined types were placed in this encounter.    Procedures: No procedures performed  Clinical Data: No additional findings.  ROS:  All other systems negative, except as noted in the HPI. Review of Systems  Objective: Vital Signs: There were no vitals taken for this visit.  Specialty Comments:  No specialty comments available.  PMFS History: Patient Active Problem List   Diagnosis Date Noted   Hx of excision of lamina of lumbar vertebra for decompression of spinal cord 03/20/2022   Anemia 02/28/2022   Dysuria  02/28/2022   History of osteoarthritis 02/28/2022   Peripheral edema 02/28/2022   Recurrent major depressive disorder ([place]) 02/28/2022   Low back pain 02/28/2022   Right leg pain 02/28/2022   Chronic headache 02/28/2022   Obesity 02/28/2022   Recurrent falls 08/25/2021   Memory difficulty 07/04/2021   Chronic toe ulcer, left, with fat layer exposed ([place]) 01/23/2021   Pituitary adenoma ([place]) 09/06/2020   Chronic intractable headache 07/27/2020   Vitamin B12 deficiency 03/31/2020   Vitamin D deficiency 03/31/2020   Numbness of foot 03/25/2020   Right hand pain 12/14/2019   Status post amputation of great toe, left ([place]) 12/14/2019   Callus of foot 04/02/2019   Allergy to bee sting 03/11/2019   Insomnia 02/10/2019   Status post lumbar spine operative procedure for decompression of spinal cord 12/12/2018   Dyslipidemia 10/19/2018   Foot deformity 10/19/2018   Facial rash 05/26/2018   Status post reverse total replacement of right shoulder 05/13/2018   CHF (congestive heart failure) ([place]) 12/27/2017   OSA (obstructive sleep apnea) 12/27/2017   Health maintenance examination 11/25/2017   CKD (chronic kidney disease) stage 3, GFR 30-59 ml/min (HCC) 10/01/2017   Fatty liver 10/01/2017   Status post reverse total replacement of left shoulder 09/03/2017   Bilateral shoulder pain 05/24/2017   History of CVA in adulthood 03/08/2017   S/P total knee arthroplasty, left 01/30/2017   Arthritis of knee 01/15/2017   Skin rash 12/10/2016   Medicare annual wellness visit, subsequent 10/05/2016   Advanced care planning/counseling discussion 10/05/2016   Obesity, morbid, BMI 40.0-49.9 ([place]) 07/19/2016   Hypothyroidism    Iron deficiency 04/23/2016   Chronic pain syndrome 04/23/2016   Inflammatory arthritis 04/23/2016   Chronic leg pain 04/23/2016   Lesion of palate 04/23/2016   Grieving 04/23/2016   GERD (gastroesophageal reflux disease)    Duodenitis determined by biopsy 02/13/2016    Diverticulosis of colon without hemorrhage 81/85/6314   Complicated grief 97/11/6376   Chest pain 01/11/2012   Migraine 01/11/2012   History of methicillin resistant staphylococcus aureus (MRSA) 07/04/2010   DDD (degenerative disc disease), lumbar 07/04/2010   History of total right knee replacement 07/04/2010   Contact dermatitis 07/04/2010   Lesion of ulnar nerve 07/04/2010   Closed fracture of malar bone ([place]) 07/04/2010   Methicillin susceptible Staphylococcus aureus infection 07/04/2010   Narrowing of  intervertebral disc space 07/04/2010   Olecranon bursitis 07/04/2010   History of artificial joint 07/04/2010   Primary osteoarthritis, right shoulder 06/09/2007   Osteoarthritis 06/09/2007   Personal history of colonic adenoma 06/01/2003   History of colonic polyps 06/01/2003   Past Medical History:  Diagnosis Date   Acute kidney failure 08/2008   "cleared up"  no problems since   Allergy    Anxiety    Arthritis    Asthma    ?of this no inhaler   Blood transfusion    CHF (congestive heart failure) ([place])    ?of this, pt denies   CLOSTRIDIUM DIFFICILE COLITIS 07/04/2010   Annotation: 12/09, 2/10 Qualifier: Diagnosis of  By: [name], [name]     Depression with anxiety    Duodenitis determined by biopsy 02/2016   peptic likely due to aleve (erosive gastropathy with duodenal erosions)   ECZEMA 07/04/2010   Qualifier: Diagnosis of  By: [name], [name]     Elevated liver enzymes    Fatty liver    Full dentures    GERD (gastroesophageal reflux disease)    Heart attack ([place])    08/2008 (likely demand ischemia in the setting of MSSA sepsis/R TKA  infection)10-2008   History of hiatal hernia    History of stomach ulcers    Hypothyroidism    Lower GI bleeding    MALAR AND MAXILLARY BONES CLOSED FRACTURE 07/04/2010   Annotation: ORIF Qualifier: Diagnosis of  By: [name], [name]     Migraine    "definitely"   MRSA (methicillin resistant Staphylococcus aureus)    in leg,  had to place steel rod in leg   Personal history of colonic adenoma 06/01/2003   PFO (patent foramen ovale)    small PFO by 08/2008 TEE   Pneumonia 08/2008   "while in ICU"   Seizures ([place]) 2009   "long time ago"     Sleep apnea    does not wear CPAP   Stroke ([place]) 2010   unable to complete sentences at times   Wears glasses     Family History  Problem Relation Age of Onset   Coronary artery disease Father 64   Stroke Father    CAD Mother 5       stents   Breast cancer Sister    Diabetes Brother    Diabetes Sister    Colon cancer Neg Hx    Stomach cancer Neg Hx     Past Surgical History:  Procedure Laterality Date   AMPUTATION Left 01/24/2022   Procedure: LEFT GREAT TOE AMPUTATION AT METATARSOPHALANGEAL JOINT AND SECOND TOE AMPUTATION;  Surgeon: Newt Minion, MD;  Location: [facility];  Service: Orthopedics;  Laterality: Left;   anterior  nerve transposition  07/2009   left ulnar nerve   antibiotic spacer exchange  11/2008; 08/2006   right knee   ARTHROTOMY  08/2008   right knee w/I&D   CARDIOVASCULAR STRESS TEST  2013   stress EKG - negative for ischemia, 4 min 7.3 METs, normal blood pressure response   CATARACT EXTRACTION     COLONOSCOPY  11/2012   diverticulosis, rpt 10 yrs [name])   ESOPHAGOGASTRODUODENOSCOPY  02/2016   erosive gastropathy with duodenal erosions [name])   HARDWARE REMOVAL  04/2006   right knee w/antibiotic spacers placed   INGUINAL HERNIA REPAIR  early 1990's   bilateral   JOINT REPLACEMENT Bilateral    KNEE ARTHROSCOPY  10/2001   right   KNEE FUSION  03/2009   right knee removal; antibiotic spacers;    LEFT HEART CATH AND CORONARY ANGIOGRAPHY N/A 02/04/2018    WNL (Patwardhan, Reynold Bowen, MD)   LUMBAR LAMINECTOMY/DECOMPRESSION MICRODISCECTOMY N/A 12/05/2018   Procedure: LUMBAR THREE TO FOUR AND LUMBAR FOUR TO FIVE DECOMPRESSION;  Surgeon: [name], MD;  Location: [facility];  Service: Orthopedics;  Laterality: N/A;   MULTIPLE TOOTH EXTRACTIONS      REPLACEMENT TOTAL KNEE  08/2008; 09/2001   right   REVERSE SHOULDER ARTHROPLASTY Left 09/03/2017   Procedure: REVERSE SHOULDER ARTHROPLASTY;  Surgeon: Meredith Pel, MD;  Location: [facility];  Service: Orthopedics;  Laterality: Left;   REVERSE SHOULDER ARTHROPLASTY Right 05/13/2018   Procedure: RIGHT REVERSE SHOULDER ARTHROPLASTY;  Surgeon: Meredith Pel, MD;  Location: [facility];  Service: Orthopedics;  Laterality: Right;   rod placement Right 03/2009   R knee   SYNOVECTOMY  06/2005   debridement, liner exchange right knee   TOE AMPUTATION Left 08/2008   great toe - osteomyelitis (staph infection)   TOE AMPUTATION Left 04/2016   2nd toe [name]   TONSILLECTOMY     TOTAL KNEE ARTHROPLASTY Left 01/15/2017   TOTAL KNEE ARTHROPLASTY Left 01/15/2017   Procedure: TOTAL KNEE ARTHROPLASTY;  Surgeon: Meredith Pel, MD;  Location: [facility];  Service: Orthopedics;  Laterality: Left;   Social History   Occupational History   Occupation: retired  Tobacco Use   Smoking status: Never    Passive exposure: Past   Smokeless tobacco: Never  Vaping Use   Vaping Use: Never used  Substance and Sexual Activity   Alcohol use: No    Alcohol/week: 0.0 standard drinks    Comment: "I abused alcohol; last drink  ~ 2005"   Drug use: No   Sexual activity: Never

## 2022-03-22 ENCOUNTER — Other Ambulatory Visit: Payer: Self-pay | Admitting: Family Medicine

## 2022-03-22 ENCOUNTER — Other Ambulatory Visit: Payer: Self-pay | Admitting: Neurology

## 2022-03-22 NOTE — Telephone Encounter (Signed)
Rx refilled.

## 2022-03-26 ENCOUNTER — Telehealth: Payer: Self-pay

## 2022-03-26 NOTE — Chronic Care Management (AMB) (Signed)
Chronic Care Management Pharmacy Assistant   Name: Nicholas [name]  MRN: [id number] DOB: 1951-07-22  Reason for Encounter: Medication Adherence and Delivery Coordination   PTM  Recent office visits:  None since last CCM contact   Recent consult visits:  03/20/22-Mark Yates,MD(ortho)-acute low back pain,continue gabapentin,discuss diet and exercise.No medication changes. 03/05/22-[name],MD(ortho)-f/u left toe amputation -no medication changes  Hospital visits:  None in previous 6 months  Medications: Outpatient Encounter Medications as of 03/26/2022  Medication Sig Note   acetaminophen (TYLENOL) 500 MG tablet Take 1,000 mg by mouth 3 (three) times daily.    amitriptyline (ELAVIL) 50 MG tablet TAKE TWO TABLETS BY MOUTH EVERYDAY AT BEDTIME (Patient taking differently: Take 100 mg by mouth at bedtime.)    atorvastatin (LIPITOR) 20 MG tablet TAKE ONE TABLET BY MOUTH ONCE DAILY    B Complex Vitamins (B-COMPLEX/B-12 PO) Take 1 tablet by mouth daily.    Cholecalciferol (VITAMIN D3) 25 MCG (1000 UT) CAPS Take 1 capsule (1,000 Units total) by mouth daily.    clotrimazole (LOTRIMIN) 1 % cream Apply 1 application topically 2 (two) times daily. To groin (Patient taking differently: Apply 1 application. topically 2 (two) times daily as needed (rash).)    divalproex (DEPAKOTE ER) 500 MG 24 hr tablet Take 1 tablet (500 mg total) by mouth at bedtime.    EPINEPHrine 0.3 mg/0.3 mL IJ SOAJ injection Inject 0.3 mg into the muscle as needed for anaphylaxis. (bee stings)    ferrous sulfate 325 (65 FE) MG EC tablet Take 325 mg by mouth daily with breakfast.    furosemide (LASIX) 20 MG tablet TAKE ONE TABLET BY MOUTH EVERY MORNING (Patient taking differently: Take 20 mg by mouth daily.)    gabapentin (NEURONTIN) 300 MG capsule TAKE ONE CAPSULE BY MOUTH EVERY MORNING and TAKE TWO CAPSULES BY MOUTH AT NOON and TAKE TWO CAPSULES BY MOUTH EVERY EVENING    ketoconazole (NIZORAL) 2 % shampoo Apply 1 application  topically 3 (three) times a week.    levothyroxine (SYNTHROID) 112 MCG tablet Take 1 tablet (112 mcg total) by mouth daily before breakfast.    methocarbamol (ROBAXIN) 500 MG tablet Take 1 tablet (500 mg total) by mouth 2 (two) times daily as needed for muscle spasms.    metoprolol succinate (TOPROL-XL) 100 MG 24 hr tablet TAKE ONE TABLET BY MOUTH EVERYDAY AT BEDTIME    nitroGLYCERIN (NITROSTAT) 0.4 MG SL tablet Place 1 tablet (0.4 mg total) under the tongue every 5 (five) minutes as needed for chest pain.    ondansetron (ZOFRAN ODT) 4 MG disintegrating tablet Take 1 tablet (4 mg total) by mouth every 8 (eight) hours as needed. (Patient taking differently: Take 4 mg by mouth every 8 (eight) hours as needed for nausea or vomiting.)    oxyCODONE-acetaminophen (PERCOCET/ROXICET) 5-325 MG tablet Take 1 tablet by mouth every 4 (four) hours as needed.    pantoprazole (PROTONIX) 40 MG tablet Take 1 tablet (40 mg total) by mouth every other day.    predniSONE (STERAPRED UNI-PAK 21 TAB) 10 MG (21) TBPK tablet Take as directed (Patient not taking: Reported on 02/28/2022)    tiZANidine (ZANAFLEX) 4 MG tablet TAKE ONE TABLET BY MOUTH EVERY 6 HOURS AS NEEDED FOR acute HEADACHE    Ubrogepant (UBRELVY) 50 MG TABS Take 50 mg by mouth daily as needed. (Patient taking differently: Take 50 mg by mouth daily as needed.) 01/19/2022: New RX   No facility-administered encounter medications on file as of 03/26/2022.  BP Readings from Last 3 Encounters:  03/20/22 119/75  02/19/22 124/70  01/24/22 119/70    Lab Results  Component Value Date   HGBA1C 5.6 07/13/2010      Recent OV, Consult or Hospital visit:  orthopedic f/u No medication changes indicated   Last adherence delivery date:03/06/22      Patient is due for next adherence delivery on: 04/04/22  Spoke with patient on 03/26/22 reviewed medications and coordinated delivery.  This delivery to include: Adherence Packaging  30 Days  Packs: Gabapentin '300mg'$  -  take 1 tablet breakfast, 2 tablets lunch,2 tablets evening meal Furosemide '20mg'$  - take 1 tablet daily breakfast  daily Pantoprazole '40mg'$  - take 1 tablet at breakfast Atorvastatin '20mg'$  - take 1 tablet at breakfast Vitamin D3 21mg - take 1 capsule breakfast Ferrous sulfate 324 mg - take 1 tablet at breakfast  daily  Metoprolol succinate '100mg'$  - take 1 tablet at bedtime  Levothyroxine 112 mcg - take 1 tablet at breakfast Amitriptyline '50mg'$  - take 2 tablets at bedtime Divalproex '500mg'$ - take 1 tablet bedtime   VIAL medications: Zofran '4mg'$ - take 1 tablet as needed for nausea Methocarbamol '500mg'$ - take 1 tablet 2 times daily as needed    Patient declined the following medications this month: Ubrelvy-'50mg'$  use as needed- not due for refill Nitrostat 0.'4mg'$  SL tablets- supply on hand  tizanidine '4mg'$ - take 1 tablet every 6 hours as needed- specialist needs to authorize   Any concerns about your medications? No  How often do you forget or accidentally miss a dose? Never  Do you use a pillbox? No  Is patient in packaging Yes  If yes  What is the date on your next pill pack?unable to reach box to read pack date   Any concerns or issues with your packaging? No concerns at this time    Refills requested from providers include: metoprolol, tizanidine  Confirmed delivery date of 04/04/22, advised patient that pharmacy will contact them the morning of delivery.  Recent blood pressure readings are as follows:none available    Recent blood glucose readings are as follows: none available   Annual wellness visit in last year? Yes Most Recent BP reading:124/70  61-P 02/19/22   LCharlene Brooke CPP notified  [place] [phone]

## 2022-04-09 ENCOUNTER — Encounter: Payer: Self-pay | Admitting: Orthopedic Surgery

## 2022-04-09 ENCOUNTER — Ambulatory Visit (INDEPENDENT_AMBULATORY_CARE_PROVIDER_SITE_OTHER): Payer: Medicare Other | Admitting: Orthopedic Surgery

## 2022-04-09 DIAGNOSIS — Z89412 Acquired absence of left great toe: Secondary | ICD-10-CM

## 2022-04-20 ENCOUNTER — Other Ambulatory Visit: Payer: Self-pay | Admitting: Neurology

## 2022-04-23 ENCOUNTER — Telehealth: Payer: Self-pay

## 2022-04-23 NOTE — Chronic Care Management (AMB) (Signed)
Chronic Care Management Pharmacy Assistant   Name: Nicholas [name]  MRN: [id number] DOB: August 27, 1951   Reason for Encounter: Medication Adherence and Delivery Coordination   Recent office visits:  None since last CCM contact    Recent consult visits:  04/09/22-[name],MD(ortho)- f/u left toe amputee,recommend anti fungal ointment,f/u 3 months   Hospital visits:  None in previous 6 months  Medications: Outpatient Encounter Medications as of 04/23/2022  Medication Sig Note   acetaminophen (TYLENOL) 500 MG tablet Take 1,000 mg by mouth 3 (three) times daily.    amitriptyline (ELAVIL) 50 MG tablet TAKE TWO TABLETS BY MOUTH EVERYDAY AT BEDTIME (Patient taking differently: Take 100 mg by mouth at bedtime.)    atorvastatin (LIPITOR) 20 MG tablet TAKE ONE TABLET BY MOUTH ONCE DAILY    B Complex Vitamins (B-COMPLEX/B-12 PO) Take 1 tablet by mouth daily.    Cholecalciferol (VITAMIN D3) 25 MCG (1000 UT) CAPS Take 1 capsule (1,000 Units total) by mouth daily.    clotrimazole (LOTRIMIN) 1 % cream Apply 1 application topically 2 (two) times daily. To groin (Patient taking differently: Apply 1 application. topically 2 (two) times daily as needed (rash).)    divalproex (DEPAKOTE ER) 500 MG 24 hr tablet Take 1 tablet (500 mg total) by mouth at bedtime.    EPINEPHrine 0.3 mg/0.3 mL IJ SOAJ injection Inject 0.3 mg into the muscle as needed for anaphylaxis. (bee stings)    ferrous sulfate 325 (65 FE) MG EC tablet Take 325 mg by mouth daily with breakfast.    furosemide (LASIX) 20 MG tablet TAKE ONE TABLET BY MOUTH EVERY MORNING (Patient taking differently: Take 20 mg by mouth daily.)    gabapentin (NEURONTIN) 300 MG capsule TAKE ONE CAPSULE BY MOUTH EVERY MORNING and TAKE TWO CAPSULES BY MOUTH AT NOON and TAKE TWO CAPSULES BY MOUTH EVERY EVENING    ketoconazole (NIZORAL) 2 % shampoo Apply 1 application topically 3 (three) times a week.    levothyroxine (SYNTHROID) 112 MCG tablet Take 1 tablet (112  mcg total) by mouth daily before breakfast.    methocarbamol (ROBAXIN) 500 MG tablet Take 1 tablet (500 mg total) by mouth 2 (two) times daily as needed for muscle spasms.    metoprolol succinate (TOPROL-XL) 100 MG 24 hr tablet TAKE ONE TABLET BY MOUTH EVERYDAY AT BEDTIME    nitroGLYCERIN (NITROSTAT) 0.4 MG SL tablet Place 1 tablet (0.4 mg total) under the tongue every 5 (five) minutes as needed for chest pain.    ondansetron (ZOFRAN-ODT) 4 MG disintegrating tablet TAKE ONE TABLET BY MOUTH EVERY 8 HOURS AS NEEDED    oxyCODONE-acetaminophen (PERCOCET/ROXICET) 5-325 MG tablet Take 1 tablet by mouth every 4 (four) hours as needed.    pantoprazole (PROTONIX) 40 MG tablet Take 1 tablet (40 mg total) by mouth every other day.    predniSONE (STERAPRED UNI-PAK 21 TAB) 10 MG (21) TBPK tablet Take as directed (Patient not taking: Reported on 02/28/2022)    tiZANidine (ZANAFLEX) 4 MG tablet TAKE ONE TABLET BY MOUTH EVERY 6 HOURS AS NEEDED FOR acute HEADACHE    Ubrogepant (UBRELVY) 50 MG TABS Take 50 mg by mouth daily as needed. (Patient taking differently: Take 50 mg by mouth daily as needed.) 01/19/2022: New RX   No facility-administered encounter medications on file as of 04/23/2022.   BP Readings from Last 3 Encounters:  03/20/22 119/75  02/19/22 124/70  01/24/22 119/70    Lab Results  Component Value Date   HGBA1C 5.6 07/13/2010  Recent OV, Consult or Hospital visit:  No medication changes indicated   Last adherence delivery date:04/04/22      Patient is due for next adherence delivery on: 05/03/22  Spoke with patient on 04/23/22 reviewed medications and coordinated delivery.  This delivery to include: Adherence Packaging  30 Days  Packs: Gabapentin '300mg'$  - take 1 tablet breakfast, 2 tablets lunch,2 tablets evening meal Furosemide '20mg'$  - take 1 tablet daily breakfast  daily Pantoprazole '40mg'$  - take 1 tablet at breakfast Atorvastatin '20mg'$  - take 1 tablet at breakfast Vitamin D3 51mg -  take 1 capsule breakfast Ferrous sulfate 324 mg - take 1 tablet at breakfast  daily  Metoprolol succinate '100mg'$  - take 1 tablet at bedtime  Levothyroxine 112 mcg - take 1 tablet at breakfast Amitriptyline '50mg'$  - take 2 tablets at bedtime Divalproex '500mg'$ - take 1 tablet bedtime   VIAL medications: Zofran '4mg'$ - take 1 tablet as needed for nausea Methocarbamol '500mg'$ - take 1 tablet 2 times daily as needed  Tizanidine '4mg'$ - take 1 tablet every 6 hours for HA as needed   Patient declined the following medications this month: Ubrelvy-'50mg'$  use as needed- not due for refill yet Nitrostat 0.'4mg'$  SL tablets- supply on hand    Any concerns about your medications? No  How often do you forget or accidentally miss a dose? Never  Do you use a pillbox? Yes    Is patient in packaging Yes  What is the date on your next pill pack? 04/03/22  Any concerns or issues with your packaging? Satisfied with process     Refills requested from providers include:  Ondansetron   Confirmed delivery date of 05/03/22, advised patient that pharmacy will contact them the morning of delivery.  Recent blood pressure readings are as follows:none available   Annual wellness visit in last year? Yes Most Recent BP reading:124/70  61-P  02/19/22  Cycle dispensing form sent to LMargaretmary Dys PTM for review.  LCharlene Brooke CPP notified  [place] [phone]

## 2022-05-22 ENCOUNTER — Other Ambulatory Visit: Payer: Self-pay | Admitting: Family Medicine

## 2022-05-22 ENCOUNTER — Other Ambulatory Visit: Payer: Self-pay | Admitting: Neurology

## 2022-05-22 ENCOUNTER — Other Ambulatory Visit: Payer: Self-pay | Admitting: Physician Assistant

## 2022-05-22 NOTE — Telephone Encounter (Signed)
Refill request furosemide Last refill 11/22/21 #90/1 Last office visit 02/19/22 See allergy/contraindication

## 2022-05-23 ENCOUNTER — Telehealth: Payer: Self-pay

## 2022-05-23 NOTE — Chronic Care Management (AMB) (Signed)
Chronic Care Management Pharmacy Assistant   Name: NYKO GELL  MRN: [id number] DOB: 06/10/1951   Reason for Encounter: Medication Adherence and Delivery Coordination   Recent office visits:  None since last CCM contact  Recent consult visits:  04/09/22-[name],MD(ortho)-post op left toe amputation.Recommended antifungal ointment treatments for the right toenails.  Continue with Achilles stretching on the left.  Recommended a stationary bike for exercise. F/u 3 months  Hospital visits:  None in previous 6 months  Medications: Outpatient Encounter Medications as of 05/23/2022  Medication Sig Note   acetaminophen (TYLENOL) 500 MG tablet Take 1,000 mg by mouth 3 (three) times daily.    amitriptyline (ELAVIL) 50 MG tablet TAKE TWO TABLETS BY MOUTH EVERYDAY AT BEDTIME (Patient taking differently: Take 100 mg by mouth at bedtime.)    atorvastatin (LIPITOR) 20 MG tablet TAKE ONE TABLET BY MOUTH ONCE DAILY    B Complex Vitamins (B-COMPLEX/B-12 PO) Take 1 tablet by mouth daily.    Cholecalciferol (VITAMIN D3) 25 MCG (1000 UT) CAPS Take 1 capsule (1,000 Units total) by mouth daily.    clotrimazole (LOTRIMIN) 1 % cream Apply 1 application topically 2 (two) times daily. To groin (Patient taking differently: Apply 1 application. topically 2 (two) times daily as needed (rash).)    divalproex (DEPAKOTE ER) 500 MG 24 hr tablet Take 1 tablet (500 mg total) by mouth at bedtime.    EPINEPHrine 0.3 mg/0.3 mL IJ SOAJ injection Inject 0.3 mg into the muscle as needed for anaphylaxis. (bee stings)    ferrous sulfate 325 (65 FE) MG EC tablet Take 325 mg by mouth daily with breakfast.    furosemide (LASIX) 20 MG tablet TAKE ONE TABLET BY MOUTH EVERY MORNING (Patient taking differently: Take 20 mg by mouth daily.)    gabapentin (NEURONTIN) 300 MG capsule TAKE ONE CAPSULE BY MOUTH EVERY MORNING and TAKE TWO CAPSULES BY MOUTH AT NOON and TAKE TWO CAPSULES BY MOUTH EVERY EVENING    ketoconazole (NIZORAL) 2  % shampoo Apply 1 application topically 3 (three) times a week.    levothyroxine (SYNTHROID) 112 MCG tablet Take 1 tablet (112 mcg total) by mouth daily before breakfast.    methocarbamol (ROBAXIN) 500 MG tablet Take 1 tablet (500 mg total) by mouth 2 (two) times daily as needed for muscle spasms.    metoprolol succinate (TOPROL-XL) 100 MG 24 hr tablet TAKE ONE TABLET BY MOUTH EVERYDAY AT BEDTIME    nitroGLYCERIN (NITROSTAT) 0.4 MG SL tablet Place 1 tablet (0.4 mg total) under the tongue every 5 (five) minutes as needed for chest pain.    ondansetron (ZOFRAN-ODT) 4 MG disintegrating tablet TAKE ONE TABLET BY MOUTH EVERY 8 HOURS AS NEEDED    oxyCODONE-acetaminophen (PERCOCET/ROXICET) 5-325 MG tablet Take 1 tablet by mouth every 4 (four) hours as needed.    pantoprazole (PROTONIX) 40 MG tablet Take 1 tablet (40 mg total) by mouth every other day.    predniSONE (STERAPRED UNI-PAK 21 TAB) 10 MG (21) TBPK tablet Take as directed (Patient not taking: Reported on 02/28/2022)    tiZANidine (ZANAFLEX) 4 MG tablet TAKE ONE TABLET BY MOUTH every SIX hours AS NEEDED FOR acute HEADACHE    Ubrogepant (UBRELVY) 50 MG TABS Take 50 mg by mouth daily as needed. (Patient taking differently: Take 50 mg by mouth daily as needed.) 01/19/2022: New RX   No facility-administered encounter medications on file as of 05/23/2022.    BP Readings from Last 3 Encounters:  03/20/22 119/75  02/19/22 124/70  01/24/22 119/70    Lab Results  Component Value Date   HGBA1C 5.6 07/13/2010      Recent OV, Consult or Hospital visit:  04/09/22-[name],MD(ortho)-post op left toe amputation.Recommended antifungal ointment treatments for the right toenails.  Continue with Achilles stretching on the left.  Recommended a stationary bike for exercise. F/u 3 months   Last adherence delivery date:05/03/22      Patient is due for next adherence delivery on: 06/04/22  Spoke with patient on 05/25/22 reviewed medications and coordinated  delivery.  This delivery to include: Adherence Packaging  30 Days  Packs: Gabapentin '300mg'$  - take 1 tablet breakfast, 2 tablets lunch,2 tablets evening meal Furosemide '20mg'$  - take 1 tablet daily breakfast  daily Pantoprazole '40mg'$  - take 1 tablet at breakfast Atorvastatin '20mg'$  - take 1 tablet at breakfast Vitamin D3 76mg - take 1 capsule breakfast Ferrous sulfate 324 mg - take 1 tablet at breakfast  daily  Metoprolol succinate '100mg'$  - take 1 tablet at bedtime  Levothyroxine 112 mcg - take 1 tablet at breakfast Amitriptyline '50mg'$  - take 2 tablets at bedtime Divalproex '500mg'$ - take 1 tablet bedtime  VIAL medications: Zofran '4mg'$ - take 1 tablet as needed for nausea Methocarbamol '500mg'$ - take 1 tablet 2 times daily as needed  Tizanidine '4mg'$ - take 1 tablet every 6 hours for HA as needed Ubrelvy-'50mg'$  use as needed-  Patient declined the following medications this month: Nitrostat 0.'4mg'$  SL tablets- supply on hand    Any concerns about your medications? No  How often do you forget or accidentally miss a dose? Never  Is patient in packaging Yes  What is the date on your next pill pack? Not near pack to read date   Any concerns or issues with your packaging?satisfied with process   Refills requested from providers include:methocarbamol,tizanidine,furosemide  Confirmed delivery date of 06/04/22, advised patient that pharmacy will contact them the morning of delivery.  Recent blood pressure readings are as follows:none available    Annual wellness visit in last year? Yes Most Recent BP reading:124/70  61-P 02/19/22   Cycle dispensing form sent to LMargaretmary Dys PTM for review.  LCharlene Brooke CPP notified  [place] [phone]

## 2022-05-31 ENCOUNTER — Other Ambulatory Visit: Payer: Self-pay

## 2022-06-01 ENCOUNTER — Telehealth: Payer: Self-pay | Admitting: Physician Assistant

## 2022-06-01 ENCOUNTER — Other Ambulatory Visit: Payer: Self-pay | Admitting: Orthopedic Surgery

## 2022-06-01 MED ORDER — METHOCARBAMOL 500 MG PO TABS: 500.0000 mg | ORAL_TABLET | Freq: Two times a day (BID) | ORAL | 2 refills | Status: DC | PRN

## 2022-06-01 NOTE — Telephone Encounter (Signed)
[facility] [employer]) called about a request for refill of methocarbamol for pt. Please call Chasity at [facility]

## 2022-06-15 ENCOUNTER — Encounter: Payer: Self-pay | Admitting: Family Medicine

## 2022-06-15 ENCOUNTER — Ambulatory Visit (INDEPENDENT_AMBULATORY_CARE_PROVIDER_SITE_OTHER): Payer: Medicare Other | Admitting: Family Medicine

## 2022-06-15 ENCOUNTER — Ambulatory Visit (INDEPENDENT_AMBULATORY_CARE_PROVIDER_SITE_OTHER)
Admission: RE | Admit: 2022-06-15 | Discharge: 2022-06-15 | Disposition: A | Discharge status: Home / Self Care | Payer: Medicare Other | Source: Ambulatory Visit | Attending: Family Medicine | Admitting: Family Medicine

## 2022-06-15 ENCOUNTER — Telehealth: Payer: Self-pay

## 2022-06-15 VITALS — BP 118/64 | HR 74 | Temp 97.7°F | Ht 68.0 in

## 2022-06-15 DIAGNOSIS — G894 Chronic pain syndrome: Secondary | ICD-10-CM

## 2022-06-15 DIAGNOSIS — R296 Repeated falls: Secondary | ICD-10-CM

## 2022-06-15 DIAGNOSIS — Z8614 Personal history of Methicillin resistant Staphylococcus aureus infection: Secondary | ICD-10-CM | POA: Diagnosis not present

## 2022-06-15 DIAGNOSIS — M79604 Pain in right leg: Secondary | ICD-10-CM

## 2022-06-15 DIAGNOSIS — Z96612 Presence of left artificial shoulder joint: Secondary | ICD-10-CM | POA: Diagnosis not present

## 2022-06-15 DIAGNOSIS — S91302A Unspecified open wound, left foot, initial encounter: Secondary | ICD-10-CM

## 2022-06-15 DIAGNOSIS — N1831 Chronic kidney disease, stage 3a: Secondary | ICD-10-CM

## 2022-06-15 DIAGNOSIS — M170 Bilateral primary osteoarthritis of knee: Secondary | ICD-10-CM | POA: Diagnosis not present

## 2022-06-15 DIAGNOSIS — M79672 Pain in left foot: Secondary | ICD-10-CM | POA: Diagnosis not present

## 2022-06-15 DIAGNOSIS — Z96611 Presence of right artificial shoulder joint: Secondary | ICD-10-CM

## 2022-06-15 DIAGNOSIS — M5136 Other intervertebral disc degeneration, lumbar region: Secondary | ICD-10-CM

## 2022-06-15 DIAGNOSIS — M21962 Unspecified acquired deformity of left lower leg: Secondary | ICD-10-CM | POA: Diagnosis not present

## 2022-06-15 DIAGNOSIS — Z96652 Presence of left artificial knee joint: Secondary | ICD-10-CM

## 2022-06-15 DIAGNOSIS — M159 Polyosteoarthritis, unspecified: Secondary | ICD-10-CM | POA: Diagnosis not present

## 2022-06-15 DIAGNOSIS — L84 Corns and callosities: Secondary | ICD-10-CM

## 2022-06-15 DIAGNOSIS — L97522 Non-pressure chronic ulcer of other part of left foot with fat layer exposed: Secondary | ICD-10-CM

## 2022-06-15 DIAGNOSIS — S92515A Nondisplaced fracture of proximal phalanx of left lesser toe(s), initial encounter for closed fracture: Secondary | ICD-10-CM | POA: Diagnosis not present

## 2022-06-15 DIAGNOSIS — G8929 Other chronic pain: Secondary | ICD-10-CM

## 2022-06-15 DIAGNOSIS — Z89412 Acquired absence of left great toe: Secondary | ICD-10-CM

## 2022-06-15 DIAGNOSIS — M150 Primary generalized (osteo)arthritis: Secondary | ICD-10-CM

## 2022-06-15 DIAGNOSIS — M51369 Other intervertebral disc degeneration, lumbar region without mention of lumbar back pain or lower extremity pain: Secondary | ICD-10-CM

## 2022-06-15 DIAGNOSIS — Z9889 Other specified postprocedural states: Secondary | ICD-10-CM

## 2022-06-15 MED ORDER — TRAMADOL HCL 50 MG PO TABS: 50.0000 mg | ORAL_TABLET | Freq: Two times a day (BID) | ORAL | 0 refills | Status: AC | PRN

## 2022-06-15 NOTE — Patient Instructions (Addendum)
Foot xray today.  Labs today.  Try post op shoe.  May use tramadol for breakthrough pain.  Keep appointment with Dr [name].  If any worsening symptoms including fever, streaking redness, or draining pus, go to the hospital.

## 2022-06-15 NOTE — Progress Notes (Unsigned)
Patient ID: Nicholas [name], male    DOB: December 20, 1950, 71 y.o.   MRN: [id number]  This visit was conducted in person.  BP 118/64   Pulse 74   Temp 97.7 F (36.5 C) (Temporal)   Ht '5\' 8"'$  (1.727 m)   SpO2 94%   BMI 40.45 kg/m    CC: check sore on L foot Subjective:   HPI: Nicholas [name] is a 71 y.o. male presenting on 06/15/2022 for Wound Check (C/o sore on L foot. )   Walked in today concerned for sore on L foot ongoing for months, thickened callus hasn't fallen off. Very painful with any pressure on foot.  Has upcoming appt with ortho later this month (Dr [name]).  No fevers/chills, nausea, draining pus or streaking redness.   Has had several falls due to foot pain.  No h/o diabetes.  New stiff shoes.  Has had several falls out of bed, due to difficulty achieving positioning.  Due to lumbar DDD s/p surgery, severe generalized osteoarthritis s/p bilateral shoulders and knees replaced, with multiple R knee revisions and ultimately rod placement, prior toe amputations with residual L foot deformity, and now with open sore to left sole, patient requires frequent changes in body position. He has also had several falls out of bed. The patient's medical condition requires positioning of the body in ways not feasible with an ordinary bed in order to alleviate pain.   H/o multiple toe amputations and multiple lower extremity orthopedic procedures, latest L great toe at MTP joint and 2nd toe (01/2022).      Relevant past medical, surgical, family and social history reviewed and updated as indicated. Interim medical history since our last visit reviewed. Allergies and medications reviewed and updated. Outpatient Medications Prior to Visit  Medication Sig Dispense Refill   acetaminophen (TYLENOL) 500 MG tablet Take 1,000 mg by mouth 3 (three) times daily.     amitriptyline (ELAVIL) 50 MG tablet TAKE TWO TABLETS BY MOUTH EVERYDAY AT BEDTIME (Patient taking differently: Take 100 mg by mouth at  bedtime.) 180 tablet 2   atorvastatin (LIPITOR) 20 MG tablet TAKE ONE TABLET BY MOUTH ONCE DAILY 90 tablet 3   B Complex Vitamins (B-COMPLEX/B-12 PO) Take 1 tablet by mouth daily.     Cholecalciferol (VITAMIN D3) 25 MCG (1000 UT) CAPS Take 1 capsule (1,000 Units total) by mouth daily. 90 capsule 1   clotrimazole (LOTRIMIN) 1 % cream Apply 1 application topically 2 (two) times daily. To groin (Patient taking differently: Apply 1 application  topically 2 (two) times daily as needed (rash).) 60 g 1   divalproex (DEPAKOTE ER) 500 MG 24 hr tablet Take 1 tablet (500 mg total) by mouth at bedtime. 30 tablet 5   EPINEPHrine 0.3 mg/0.3 mL IJ SOAJ injection Inject 0.3 mg into the muscle as needed for anaphylaxis. (bee stings) 2 each 1   ferrous sulfate 325 (65 FE) MG EC tablet Take 325 mg by mouth daily with breakfast.     furosemide (LASIX) 20 MG tablet TAKE ONE TABLET BY MOUTH EVERY MORNING 90 tablet 1   gabapentin (NEURONTIN) 300 MG capsule TAKE ONE CAPSULE BY MOUTH EVERY MORNING and TAKE TWO CAPSULES BY MOUTH AT NOON and TAKE TWO CAPSULES BY MOUTH EVERY EVENING 450 capsule 1   ketoconazole (NIZORAL) 2 % shampoo Apply 1 application topically 3 (three) times a week. 120 mL 3   levothyroxine (SYNTHROID) 112 MCG tablet Take 1 tablet (112 mcg total) by mouth daily before  breakfast. 90 tablet 3   methocarbamol (ROBAXIN) 500 MG tablet Take 1 tablet (500 mg total) by mouth 2 (two) times daily as needed for muscle spasms. 20 tablet 2   metoprolol succinate (TOPROL-XL) 100 MG 24 hr tablet TAKE ONE TABLET BY MOUTH EVERYDAY AT BEDTIME 90 tablet 3   nitroGLYCERIN (NITROSTAT) 0.4 MG SL tablet Place 1 tablet (0.4 mg total) under the tongue every 5 (five) minutes as needed for chest pain. 20 tablet 3   ondansetron (ZOFRAN-ODT) 4 MG disintegrating tablet TAKE ONE TABLET BY MOUTH EVERY 8 HOURS AS NEEDED 20 tablet 3   pantoprazole (PROTONIX) 40 MG tablet Take 1 tablet (40 mg total) by mouth every other day.     predniSONE  (STERAPRED UNI-PAK 21 TAB) 10 MG (21) TBPK tablet Take as directed 21 tablet 0   tiZANidine (ZANAFLEX) 4 MG tablet TAKE ONE TABLET BY MOUTH every SIX hours AS NEEDED FOR acute HEADACHE 20 tablet 1   Ubrogepant (UBRELVY) 50 MG TABS Take 50 mg by mouth daily as needed. (Patient taking differently: Take 50 mg by mouth daily as needed.) 12 tablet 11   oxyCODONE-acetaminophen (PERCOCET/ROXICET) 5-325 MG tablet Take 1 tablet by mouth every 4 (four) hours as needed. 30 tablet 0   No facility-administered medications prior to visit.     Per HPI unless specifically indicated in ROS section below Review of Systems  Objective:  BP 118/64   Pulse 74   Temp 97.7 F (36.5 C) (Temporal)   Ht '5\' 8"'$  (1.727 m)   SpO2 94%   BMI 40.45 kg/m   Wt Readings from Last 3 Encounters:  03/20/22 266 lb (120.7 kg)  02/28/22 266 lb (120.7 kg)  02/19/22 266 lb 6 oz (120.8 kg)      Physical Exam Vitals and nursing note reviewed.  Constitutional:      Appearance: Normal appearance. He is not ill-appearing.     Comments: Ambulates with walker Antalgic gait  Musculoskeletal:        General: Tenderness present. Normal range of motion.     Comments:  Dry scab to left sole at 1st MTPJ partly removed with deeper wound present, probes about 52m deep Marked tenderness to palpation distal to scab and surrounding skin  No obvious fluctuance, drainage, erythema.  Wound culture sent  Skin:    General: Skin is warm and dry.     Findings: No erythema.  Neurological:     Mental Status: He is alert.  Psychiatric:        Mood and Affect: Mood normal.        Behavior: Behavior normal.    Chronic ulcer to Left sole partially debrided with alcohol pad, wound culture collected, but no obvious discharge or erythema noted.     Results for orders placed or performed in visit on 06/15/22  Sedimentation rate  Result Value Ref Range   Sed Rate 14 0 - 20 mm/h  CBC with Differential/Platelet  Result Value Ref Range   WBC  9.4 3.8 - 10.8 Thousand/uL   RBC 4.63 4.20 - 5.80 Million/uL   Hemoglobin 15.1 13.2 - 17.1 g/dL   HCT 44.3 38.5 - 50.0 %   MCV 95.7 80.0 - 100.0 fL   MCH 32.6 27.0 - 33.0 pg   MCHC 34.1 32.0 - 36.0 g/dL   RDW 12.5 11.0 - 15.0 %   Platelets 197 140 - 400 Thousand/uL   MPV 9.6 7.5 - 12.5 fL   Neutro Abs 6,279 1,500 - 7,800  cells/uL   Lymphs Abs 1,664 850 - 3,900 cells/uL   Absolute Monocytes 1,241 (H) 200 - 950 cells/uL   Eosinophils Absolute 150 15 - 500 cells/uL   Basophils Absolute 66 0 - 200 cells/uL   Neutrophils Relative % 66.8 %   Total Lymphocyte 17.7 %   Monocytes Relative 13.2 %   Eosinophils Relative 1.6 %   Basophils Relative 0.7 %  C-reactive protein  Result Value Ref Range   CRP 15.0 (H) <8.0 mg/L   Lab Results  Component Value Date   HGBA1C 5.6 07/13/2010    Lab Results  Component Value Date   CREATININE 1.46 02/19/2022   BUN 29 (H) 02/19/2022   NA 140 02/19/2022   K 4.1 02/19/2022   CL 102 02/19/2022   CO2 30 02/19/2022      Assessment & Plan:   Problem List Items Addressed This Visit     History of methicillin resistant staphylococcus aureus (MRSA)   DDD (degenerative disc disease), lumbar   Relevant Medications   traMADol (ULTRAM) 50 MG tablet   Other Relevant Orders   For home use only DME Hospital bed   Chronic pain syndrome   Relevant Medications   traMADol (ULTRAM) 50 MG tablet   Other Relevant Orders   For home use only DME Hospital bed   Chronic leg pain   Relevant Medications   traMADol (ULTRAM) 50 MG tablet   Other Relevant Orders   For home use only DME Hospital bed   Osteoarthritis of knees, bilateral   Relevant Medications   traMADol (ULTRAM) 50 MG tablet   Other Relevant Orders   For home use only DME Hospital bed   S/P total knee arthroplasty, left   Relevant Orders   For home use only DME Hospital bed   CKD (chronic kidney disease) stage 3, GFR 30-59 ml/min (HCC)   Status post reverse total replacement of left shoulder    Relevant Orders   For home use only DME Hospital bed   Status post reverse total replacement of right shoulder   Relevant Orders   For home use only DME Hospital bed   Foot deformity   Relevant Orders   For home use only DME Hospital bed   Status post lumbar spine operative procedure for decompression of spinal cord   Relevant Orders   For home use only DME Hospital bed   Callus of foot   Relevant Orders   For home use only DME Hospital bed   Status post amputation of great toe, left ([place])   Relevant Orders   For home use only DME Hospital bed   Chronic toe ulcer, left, with fat layer exposed ([place])    See below - deteriorated with significant increase in pain and now malodor, mild drainage endorsed.       Relevant Orders   For home use only DME Hospital bed   Recurrent falls   Relevant Orders   For home use only DME Hospital bed   Osteoarthritis    Requests hospital bed Rx which is reasonable as per above.       Relevant Medications   traMADol (ULTRAM) 50 MG tablet   Other Relevant Orders   For home use only DME Hospital bed   For home use only DME Hospital bed   Open wound of foot with complication, left, initial encounter - Primary    Acute worsening of chronic left sole ulcer.  No erythema or active drainage or fever or other  systemic symptom at this time. However, concern for developing bone infection given increase in pain.  Check labs today including inflammatory markers and CBC, check foot xray, probed ulcer and will send wound culture.  Low threshold to start antibiotic, likely doxycycline in h/o MRSA.  Rx tramadol for breakthrough pain, ER precautions reviewed.  Would like input from Dr [name], will try and expedite ortho appt.       Relevant Orders   DG Foot Complete Left   Sedimentation rate (Completed)   CBC with Differential/Platelet (Completed)   C-reactive protein (Completed)   WOUND CULTURE   For home use only DME Hospital bed     Meds ordered  this encounter  Medications   traMADol (ULTRAM) 50 MG tablet    Sig: Take 1 tablet (50 mg total) by mouth 2 (two) times daily as needed for up to 10 days for moderate pain.    Dispense:  20 tablet    Refill:  0   Orders Placed This Encounter  Procedures   For home use only DME Hospital bed    Order Specific Question:   Length of Need    Answer:   Lifetime    Order Specific Question:   Patient has (list medical condition):    Answer:   severe osteoarthritis s/p multiple surgeries including L toe amputations and rod placement to R knee, recurrent falls, chronic ulcer of sole of left foot    Order Specific Question:   The above medical condition requires:    Answer:   Patient requires the ability to reposition frequently    Order Specific Question:   Bed type    Answer:   Semi-electric    Order Specific Question:   Support Surface:    Answer:   Gel Overlay   WOUND CULTURE    Order Specific Question:   Source    Answer:   left sole wound   DG Foot Complete Left    Standing Status:   Future    Number of Occurrences:   1    Standing Expiration Date:   06/16/2023    Order Specific Question:   Reason for Exam (SYMPTOM  OR DIAGNOSIS REQUIRED)    Answer:   left foot pain to callus s/p toe amputation    Order Specific Question:   Preferred imaging location?    Answer:   [facility]   Sedimentation rate   CBC with Differential/Platelet   C-reactive protein     Patient Instructions  Foot xray today.  Labs today.  Try post op shoe.  May use tramadol for breakthrough pain.  Keep appointment with Dr [name].  If any worsening symptoms including fever, streaking redness, or draining pus, go to the hospital.   Follow up plan: Return if symptoms worsen or fail to improve.  [name], MD

## 2022-06-15 NOTE — Telephone Encounter (Signed)
Pt walked in with walker and said he has a hole in bottom of foot. Pt said had amputation of lt big toe and lt second toe.pt spoke with surgeon's office and his FU appt was moved up to 06/25/22. Pt said for 2 months had place on bottom of ball of lt foot that hurts. Recently pain has intensified to a pain level of 12. Pt said some yellow creamy drainage from callused looking spot slightly larger than a quarter on ball of foot below where great toe would be. Pt said a friend that is a nurse looked at foot on 06/14/22 and said pt had a hole in foot. Pt is not on abx or pain med. Do not see redness around area on bottom of foot but pt said lt foot and lower leg swells worse than the right foot and leg does. Dr Darnell Level will add pt on today at [facility]. Pt appreciative. Sending note to Dr Darnell Level and Lattie Haw CMA.

## 2022-06-16 DIAGNOSIS — S91302A Unspecified open wound, left foot, initial encounter: Secondary | ICD-10-CM | POA: Insufficient documentation

## 2022-06-16 LAB — CBC WITH DIFFERENTIAL/PLATELET
Absolute Monocytes: 1241 cells/uL — ABNORMAL HIGH (ref 200–950)
Basophils Absolute: 66 cells/uL (ref 0–200)
Basophils Relative: 0.7 %
Eosinophils Absolute: 150 cells/uL (ref 15–500)
Eosinophils Relative: 1.6 %
HCT: 44.3 % (ref 38.5–50.0)
Hemoglobin: 15.1 g/dL (ref 13.2–17.1)
Lymphs Abs: 1664 cells/uL (ref 850–3900)
MCH: 32.6 pg (ref 27.0–33.0)
MCHC: 34.1 g/dL (ref 32.0–36.0)
MCV: 95.7 fL (ref 80.0–100.0)
MPV: 9.6 fL (ref 7.5–12.5)
Monocytes Relative: 13.2 %
Neutro Abs: 6279 cells/uL (ref 1500–7800)
Neutrophils Relative %: 66.8 %
Platelets: 197 10*3/uL (ref 140–400)
RBC: 4.63 10*6/uL (ref 4.20–5.80)
RDW: 12.5 % (ref 11.0–15.0)
Total Lymphocyte: 17.7 %
WBC: 9.4 10*3/uL (ref 3.8–10.8)

## 2022-06-16 LAB — C-REACTIVE PROTEIN: CRP: 15 mg/L — ABNORMAL HIGH (ref <8.0)

## 2022-06-16 LAB — SEDIMENTATION RATE: Sed Rate: 14 mm/h (ref 0–20)

## 2022-06-16 NOTE — Assessment & Plan Note (Signed)
Acute worsening of chronic left sole ulcer.  No erythema or active drainage or fever or other systemic symptom at this time. However, concern for developing bone infection given increase in pain.  Check labs today including inflammatory markers and CBC, check foot xray, probed ulcer and will send wound culture.  Low threshold to start antibiotic, likely doxycycline in h/o MRSA.  Rx tramadol for breakthrough pain, ER precautions reviewed.  Would like input from Dr [name], will try and expedite ortho appt.

## 2022-06-16 NOTE — Assessment & Plan Note (Signed)
Requests hospital bed Rx which is reasonable as per above.

## 2022-06-16 NOTE — Assessment & Plan Note (Signed)
See below - deteriorated with significant increase in pain and now malodor, mild drainage endorsed.

## 2022-06-18 ENCOUNTER — Other Ambulatory Visit: Payer: Self-pay | Admitting: Family Medicine

## 2022-06-18 LAB — WOUND CULTURE
MICRO NUMBER:: 13864597
SPECIMEN QUALITY:: ADEQUATE

## 2022-06-18 MED ORDER — AMOXICILLIN 875 MG PO TABS: 875.0000 mg | ORAL_TABLET | Freq: Two times a day (BID) | ORAL | 0 refills | Status: AC

## 2022-06-19 ENCOUNTER — Telehealth: Payer: Self-pay

## 2022-06-19 NOTE — Telephone Encounter (Signed)
Patient returned call back,he would like a phone call.

## 2022-06-19 NOTE — Telephone Encounter (Signed)
Patient notified as instructed by telephone and verbalized understanding. 

## 2022-06-19 NOTE — Telephone Encounter (Addendum)
Lvm asking pt to call back.  Need to relay results and Dr. Synthia Innocent message (see Labs, Result Notes- 06/15/22).  Labs/Dr. Synthia Innocent msg: Your blood counts returned ok, one inflammatory marker was elevated, the other was normal. Awaiting xray results.

## 2022-06-20 ENCOUNTER — Telehealth: Payer: Self-pay

## 2022-06-20 ENCOUNTER — Other Ambulatory Visit: Payer: Self-pay | Admitting: Neurology

## 2022-06-20 ENCOUNTER — Encounter: Payer: Self-pay | Admitting: Family

## 2022-06-20 ENCOUNTER — Ambulatory Visit (INDEPENDENT_AMBULATORY_CARE_PROVIDER_SITE_OTHER): Payer: Medicare Other | Admitting: Family

## 2022-06-20 ENCOUNTER — Ambulatory Visit: Payer: Self-pay

## 2022-06-20 DIAGNOSIS — L97521 Non-pressure chronic ulcer of other part of left foot limited to breakdown of skin: Secondary | ICD-10-CM | POA: Diagnosis not present

## 2022-06-20 NOTE — Progress Notes (Signed)
Xr   Office Visit Note   Patient: Nicholas [name]           Date of Birth: [birth year]           MRN: [id number] Visit Date: 06/20/2022              Requested by: [name], MD [place],  [place] [address] PCP: [name], MD  No chief complaint on file.     HPI: The patient is a 71 year old gentleman who presents today in referral from Dr. Danise Mina for concern of worsening infection and ulcer to the left foot Wagner grade 1 ulcer beneath the first metatarsal head of the left foot he is status post great and second toe amputations earlier this year.  Having quite significant pain difficulty ambulating due to pain and is in a postop shoe some edema and warmth  Has been on amoxicillin since appoint with Dr. Danise Mina on the fourth [place] has not seen much improvement  Assessment & Plan: Visit Diagnoses:  1. Ulcer of toe of left foot, limited to breakdown of skin ([place])     Plan: We will continue on amoxicillin for another 24 hours we will check in with the patient on Friday consider changing antibiotics.  Radiographs reassuring today probes superficially with silver nitrate.  No bony changes consistent with osteomyelitis  Follow-Up Instructions: No follow-ups on file.   Ortho Exam  Patient is alert, oriented, no adenopathy, well-dressed, normal affect, normal respiratory effort. On examination of the left foot there is dime sized ulceration beneath the first metatarsal head of the left foot the first and second toes are surgically absent.  The ulcer probes 5 mm deep quite painful does not tolerate probing with silver nitrate well there is no active drainage no erythema does have significant warmth palpable dorsalis pedis pulse there is no ascending cellulitis  Imaging: No results found. No images are attached to the encounter.  Labs: Lab Results  Component Value Date   HGBA1C 5.6 07/13/2010   HGBA1C  12/08/2008    5.2 (NOTE)   The ADA  recommends the following therapeutic goal for glycemic   control related to Hgb A1C measurement:   Goal of Therapy:   < 7.0% Hgb A1C   Reference: American Diabetes Association: Clinical Practice   Recommendations 2008, Diabetes Care,  2008, 31:(Suppl 1).   HGBA1C  11/23/2008    5.1 (NOTE)   The ADA recommends the following therapeutic goal for glycemic   control related to Hgb A1C measurement:   Goal of Therapy:   < 7.0% Hgb A1C   Reference: American Diabetes Association: Clinical Practice   Recommendations 2008, Diabetes Care,  2008, 31:(Suppl 1).   ESRSEDRATE 14 06/15/2022   ESRSEDRATE 28 (H) 03/01/2021   ESRSEDRATE 18 08/03/2020   CRP 15.0 (H) 06/15/2022   LABURIC 8.8 (H) 03/01/2021   LABURIC 5.0 02/08/2016   REPTSTATUS 05/07/2018 FINAL 05/05/2018   GRAMSTAIN  03/29/2009    NO WBC SEEN NO SQUAMOUS EPITHELIAL CELLS SEEN NO ORGANISMS SEEN   GRAMSTAIN  03/29/2009    NO WBC SEEN NO SQUAMOUS EPITHELIAL CELLS SEEN NO ORGANISMS SEEN   CULT (A) 05/05/2018    <10,000 COLONIES/mL INSIGNIFICANT GROWTH Performed at [facility] [address] [place], [place] [address]    LABORGA STAPHYLOCOCCUS SPECIES (COAGULASE NEGATIVE) 11/21/2008     Lab Results  Component Value Date   ALBUMIN 4.2 02/19/2022   ALBUMIN 4.6 01/16/2022   ALBUMIN 4.2  07/04/2021    Lab Results  Component Value Date   MG 2.1 04/12/2016   MG 1.8 03/31/2009   MG 1.7 03/29/2009   Lab Results  Component Value Date   VD25OH 48.88 02/19/2022   VD25OH 40.71 07/04/2021   VD25OH 25.83 (L) 12/08/2020    No results found for: "PREALBUMIN"    Latest Ref Rng & Units 06/15/2022    4:35 PM 02/19/2022   12:19 PM 01/16/2022    2:15 PM  CBC EXTENDED  WBC 3.8 - 10.8 Thousand/uL 9.4  7.4  8.4   RBC 4.20 - 5.80 Million/uL 4.63  4.44  4.93   Hemoglobin 13.2 - 17.1 g/dL 15.1  14.6  15.8   HCT 38.5 - 50.0 % 44.3  43.3  45.5   Platelets 140 - 400 Thousand/uL 197  189.0  213   NEUT# 1,500 - 7,800 cells/uL 6,279  4.4  5.1    Lymph# 850 - 3,900 cells/uL 1,664  1.6  1.6      There is no height or weight on file to calculate BMI.  Orders:  Orders Placed This Encounter  Procedures   XR Foot Complete Left   No orders of the defined types were placed in this encounter.    Procedures: No procedures performed  Clinical Data: No additional findings.  ROS:  All other systems negative, except as noted in the HPI. Review of Systems  Constitutional: Negative.   Skin:  Positive for color change and wound.    Objective: Vital Signs: There were no vitals taken for this visit.  Specialty Comments:  No specialty comments available.  PMFS History: Patient Active Problem List   Diagnosis Date Noted   Open wound of foot with complication, left, initial encounter 06/16/2022   Hx of excision of lamina of lumbar vertebra for decompression of spinal cord 03/20/2022   Anemia 02/28/2022   Dysuria 02/28/2022   Peripheral edema 02/28/2022   Recurrent major depressive disorder ([place]) 02/28/2022   Low back pain 02/28/2022   Right leg pain 02/28/2022   Chronic headache 02/28/2022   Recurrent falls 08/25/2021   Memory difficulty 07/04/2021   Chronic toe ulcer, left, with fat layer exposed ([place]) 01/23/2021   Pituitary adenoma ([place]) 09/06/2020   Chronic intractable headache 07/27/2020   Vitamin B12 deficiency 03/31/2020   Vitamin D deficiency 03/31/2020   Numbness of foot 03/25/2020   Right hand pain 12/14/2019   Status post amputation of great toe, left ([place]) 12/14/2019   Callus of foot 04/02/2019   Allergy to bee sting 03/11/2019   Insomnia 02/10/2019   Status post lumbar spine operative procedure for decompression of spinal cord 12/12/2018   Dyslipidemia 10/19/2018   Foot deformity 10/19/2018   Facial rash 05/26/2018   Status post reverse total replacement of right shoulder 05/13/2018   CHF (congestive heart failure) ([place]) 12/27/2017   OSA (obstructive sleep apnea) 12/27/2017   Health maintenance  examination 11/25/2017   CKD (chronic kidney disease) stage 3, GFR 30-59 ml/min (HCC) 10/01/2017   Fatty liver 10/01/2017   Status post reverse total replacement of left shoulder 09/03/2017   Bilateral shoulder pain 05/24/2017   History of CVA in adulthood 03/08/2017   S/P total knee arthroplasty, left 01/30/2017   Osteoarthritis of knees, bilateral 01/15/2017   Skin rash 12/10/2016   Medicare annual wellness visit, subsequent 10/05/2016   Advanced care planning/counseling discussion 10/05/2016   Obesity, morbid, BMI 40.0-49.9 ([place]) 07/19/2016   Hypothyroidism    Iron deficiency 04/23/2016   Chronic  pain syndrome 04/23/2016   Inflammatory arthritis 04/23/2016   Chronic leg pain 04/23/2016   Lesion of palate 04/23/2016   Grieving 04/23/2016   GERD (gastroesophageal reflux disease)    Duodenitis determined by biopsy 02/13/2016   Diverticulosis of colon without hemorrhage 16/07/9603   Complicated grief 54/06/8118   Chest pain 01/11/2012   Migraine 01/11/2012   History of methicillin resistant staphylococcus aureus (MRSA) 07/04/2010   DDD (degenerative disc disease), lumbar 07/04/2010   History of total right knee replacement 07/04/2010   Contact dermatitis 07/04/2010   Lesion of ulnar nerve 07/04/2010   Closed fracture of malar bone ([place]) 07/04/2010   Methicillin susceptible Staphylococcus aureus infection 07/04/2010   Narrowing of intervertebral disc space 07/04/2010   Olecranon bursitis 07/04/2010   History of artificial joint 07/04/2010   Primary osteoarthritis, right shoulder 06/09/2007   Osteoarthritis 06/09/2007   Personal history of colonic adenoma 06/01/2003   Past Medical History:  Diagnosis Date   Acute kidney failure 08/2008   "cleared up"  no problems since   Allergy    Anxiety    Arthritis    Asthma    ?of this no inhaler   Blood transfusion    CHF (congestive heart failure) ([place])    ?of this, pt denies   CLOSTRIDIUM DIFFICILE COLITIS 07/04/2010    Annotation: 12/09, 2/10 Qualifier: Diagnosis of  By: [name], [name]     Depression with anxiety    Duodenitis determined by biopsy 02/2016   peptic likely due to aleve (erosive gastropathy with duodenal erosions)   ECZEMA 07/04/2010   Qualifier: Diagnosis of  By: [name], [name]     Elevated liver enzymes    Fatty liver    Full dentures    GERD (gastroesophageal reflux disease)    Heart attack ([place])    08/2008 (likely demand ischemia in the setting of MSSA sepsis/R TKA  infection)10-2008   History of hiatal hernia    History of stomach ulcers    Hypothyroidism    Lower GI bleeding    MALAR AND MAXILLARY BONES CLOSED FRACTURE 07/04/2010   Annotation: ORIF Qualifier: Diagnosis of  By: [name], [name]     Migraine    "definitely"   MRSA (methicillin resistant Staphylococcus aureus)    in leg, had to place steel rod in leg   Personal history of colonic adenoma 06/01/2003   PFO (patent foramen ovale)    small PFO by 08/2008 TEE   Pneumonia 08/2008   "while in ICU"   Seizures ([place]) 2009   "long time ago"     Sleep apnea    does not wear CPAP   Stroke ([place]) 2010   unable to complete sentences at times   Wears glasses     Family History  Problem Relation Age of Onset   Coronary artery disease Father 21   Stroke Father    CAD Mother 12       stents   Breast cancer Sister    Diabetes Brother    Diabetes Sister    Colon cancer Neg Hx    Stomach cancer Neg Hx     Past Surgical History:  Procedure Laterality Date   AMPUTATION Left 01/24/2022   Procedure: LEFT GREAT TOE AMPUTATION AT METATARSOPHALANGEAL JOINT AND SECOND TOE AMPUTATION;  Surgeon: Newt Minion, MD;  Location: [facility];  Service: Orthopedics;  Laterality: Left;   anterior  nerve transposition  07/2009   left ulnar nerve   antibiotic spacer exchange  11/2008;  08/2006   right knee   ARTHROTOMY  08/2008   right knee w/I&D   CARDIOVASCULAR STRESS TEST  2013   stress EKG - negative for ischemia, 4 min 7.3 METs,  normal blood pressure response   CATARACT EXTRACTION     COLONOSCOPY  11/2012   diverticulosis, rpt 10 yrs [name])   ESOPHAGOGASTRODUODENOSCOPY  02/2016   erosive gastropathy with duodenal erosions [name])   HARDWARE REMOVAL  04/2006   right knee w/antibiotic spacers placed   INGUINAL HERNIA REPAIR  early 1990's   bilateral   JOINT REPLACEMENT Bilateral    KNEE ARTHROSCOPY  10/2001   right   KNEE FUSION  03/2009   right knee removal; antibiotic spacers;    LEFT HEART CATH AND CORONARY ANGIOGRAPHY N/A 02/04/2018    WNL (Patwardhan, Reynold Bowen, MD)   LUMBAR LAMINECTOMY/DECOMPRESSION MICRODISCECTOMY N/A 12/05/2018   Procedure: LUMBAR THREE TO FOUR AND LUMBAR FOUR TO FIVE DECOMPRESSION;  Surgeon: [name], MD;  Location: [facility];  Service: Orthopedics;  Laterality: N/A;   MULTIPLE TOOTH EXTRACTIONS     REPLACEMENT TOTAL KNEE  08/2008; 09/2001   right   REVERSE SHOULDER ARTHROPLASTY Left 09/03/2017   Procedure: REVERSE SHOULDER ARTHROPLASTY;  Surgeon: Meredith Pel, MD;  Location: [facility];  Service: Orthopedics;  Laterality: Left;   REVERSE SHOULDER ARTHROPLASTY Right 05/13/2018   Procedure: RIGHT REVERSE SHOULDER ARTHROPLASTY;  Surgeon: Meredith Pel, MD;  Location: [facility];  Service: Orthopedics;  Laterality: Right;   rod placement Right 03/2009   R knee   SYNOVECTOMY  06/2005   debridement, liner exchange right knee   TOE AMPUTATION Left 08/2008   great toe - osteomyelitis (staph infection)   TOE AMPUTATION Left 04/2016   2nd toe [name]   TONSILLECTOMY     TOTAL KNEE ARTHROPLASTY Left 01/15/2017   TOTAL KNEE ARTHROPLASTY Left 01/15/2017   Procedure: TOTAL KNEE ARTHROPLASTY;  Surgeon: Meredith Pel, MD;  Location: [facility];  Service: Orthopedics;  Laterality: Left;   Social History   Occupational History   Occupation: retired  Tobacco Use   Smoking status: Never    Passive exposure: Past   Smokeless tobacco: Never  Vaping Use   Vaping Use: Never used  Substance and  Sexual Activity   Alcohol use: No    Alcohol/week: 0.0 standard drinks of alcohol    Comment: "I abused alcohol; last drink  ~ 2005"   Drug use: No   Sexual activity: Never

## 2022-06-20 NOTE — Telephone Encounter (Signed)
I called and lm on vm for pt to ask if he would like to come in tomorrow at 4 pm. I could add him to the sch. If not he does have an appt sch for 9/12 if he wants to wait until then.

## 2022-06-20 NOTE — Telephone Encounter (Signed)
-----   Message from Newt Minion, MD sent at 06/19/2022  8:29 AM EDT ----- Yes we will work him in to be seen this week. Thank you ----- Message ----- From: [name], MD Sent: 06/16/2022  12:04 PM EDT To: Newt Minion, MD  Good morning! Fyi I saw Mr Lawhorn with acute worsening of chronic L sole ulcer with concern for developing osteo given significant increase in pain, xray pending. I got labs and wound culture but there was nothing actively draining. I have not started abx yet. You're seeing him later this month, don't know if you can see him any sooner.  Thank you, -Garlon Hatchet

## 2022-06-21 ENCOUNTER — Telehealth: Payer: Self-pay

## 2022-06-21 NOTE — Chronic Care Management (AMB) (Signed)
Chronic Care Management Pharmacy Assistant   Name: Nicholas [name]  MRN: [id number] DOB: 1951-03-10  Reason for Encounter: Medication Adherence and Delivery Coordination    Recent office visits:  06/15/22-[name],MD(PCP)-wound check-deteriorated with significant increase in pain and now malodor, mild drainage endorsed. For home use only DME Hospital bed-labs, start Tramadol for breakthrough pain,post op shoe, xray,   Recent consult visits:  06/20/22-Erin Zamora,NP(ortho surg)- f/u ulcer left toe,Continue amoxicillan '875mg'$  take 1 tablet twice daily ,xray,f/u 2 weeks  Hospital visits:  None in previous 6 months  Medications: Outpatient Encounter Medications as of 06/21/2022  Medication Sig Note   acetaminophen (TYLENOL) 500 MG tablet Take 1,000 mg by mouth 3 (three) times daily.    amitriptyline (ELAVIL) 50 MG tablet TAKE TWO TABLETS BY MOUTH EVERYDAY AT BEDTIME (Patient taking differently: Take 100 mg by mouth at bedtime.)    amoxicillin (AMOXIL) 875 MG tablet Take 1 tablet (875 mg total) by mouth 2 (two) times daily for 10 days.    atorvastatin (LIPITOR) 20 MG tablet TAKE ONE TABLET BY MOUTH ONCE DAILY    B Complex Vitamins (B-COMPLEX/B-12 PO) Take 1 tablet by mouth daily.    Cholecalciferol (VITAMIN D3) 25 MCG (1000 UT) CAPS Take 1 capsule (1,000 Units total) by mouth daily.    clotrimazole (LOTRIMIN) 1 % cream Apply 1 application topically 2 (two) times daily. To groin (Patient taking differently: Apply 1 application  topically 2 (two) times daily as needed (rash).)    divalproex (DEPAKOTE ER) 500 MG 24 hr tablet TAKE ONE TABLET BY MOUTH EVERYDAY AT BEDTIME    EPINEPHrine 0.3 mg/0.3 mL IJ SOAJ injection Inject 0.3 mg into the muscle as needed for anaphylaxis. (bee stings)    ferrous sulfate 325 (65 FE) MG EC tablet Take 325 mg by mouth daily with breakfast.    furosemide (LASIX) 20 MG tablet TAKE ONE TABLET BY MOUTH EVERY MORNING    gabapentin (NEURONTIN) 300 MG capsule TAKE  ONE CAPSULE BY MOUTH EVERY MORNING and TAKE TWO CAPSULES BY MOUTH AT NOON and TAKE TWO CAPSULES BY MOUTH EVERY EVENING    ketoconazole (NIZORAL) 2 % shampoo Apply 1 application topically 3 (three) times a week.    levothyroxine (SYNTHROID) 112 MCG tablet Take 1 tablet (112 mcg total) by mouth daily before breakfast.    methocarbamol (ROBAXIN) 500 MG tablet Take 1 tablet (500 mg total) by mouth 2 (two) times daily as needed for muscle spasms.    metoprolol succinate (TOPROL-XL) 100 MG 24 hr tablet TAKE ONE TABLET BY MOUTH EVERYDAY AT BEDTIME    nitroGLYCERIN (NITROSTAT) 0.4 MG SL tablet Place 1 tablet (0.4 mg total) under the tongue every 5 (five) minutes as needed for chest pain.    ondansetron (ZOFRAN-ODT) 4 MG disintegrating tablet TAKE ONE TABLET BY MOUTH EVERY 8 HOURS AS NEEDED    pantoprazole (PROTONIX) 40 MG tablet Take 1 tablet (40 mg total) by mouth every other day.    predniSONE (STERAPRED UNI-PAK 21 TAB) 10 MG (21) TBPK tablet Take as directed    tiZANidine (ZANAFLEX) 4 MG tablet TAKE ONE TABLET BY MOUTH every SIX hours AS NEEDED FOR acute HEADACHE    traMADol (ULTRAM) 50 MG tablet Take 1 tablet (50 mg total) by mouth 2 (two) times daily as needed for up to 10 days for moderate pain.    Ubrogepant (UBRELVY) 50 MG TABS Take 50 mg by mouth daily as needed. (Patient taking differently: Take 50 mg by mouth daily as needed.)  01/19/2022: New RX   No facility-administered encounter medications on file as of 06/21/2022.    BP Readings from Last 3 Encounters:  06/15/22 118/64  03/20/22 119/75  02/19/22 124/70    Lab Results  Component Value Date   HGBA1C 5.6 07/13/2010      Recent OV, Consult or Hospital visit:  No medication changes indicated   Last adherence delivery date:06/04/22      Patient is due for next adherence delivery on: 07/03/20  Spoke with patient on 06/22/22 reviewed medications and coordinated delivery.  This delivery to include: Adherence Packaging  30 Days   Packs: Gabapentin '300mg'$  - take 1 tablet breakfast, 2 tablets lunch,2 tablets evening meal Furosemide '20mg'$  - take 1 tablet daily breakfast  daily Pantoprazole '40mg'$  - take 1 tablet at breakfast Atorvastatin '20mg'$  - take 1 tablet at breakfast Vitamin D3 63mg - take 1 capsule breakfast Ferrous sulfate 324 mg - take 1 tablet at breakfast  daily  Metoprolol succinate '100mg'$  - take 1 tablet at bedtime  Levothyroxine 112 mcg - take 1 tablet at breakfast Amitriptyline '50mg'$  - take 2 tablets at bedtime Divalproex '500mg'$ - take 1 tablet bedtime  VIAL medications: Zofran '4mg'$ - take 1 tablet as needed for nausea Methocarbamol '500mg'$ - take 1 tablet 2 times daily as needed  Tizanidine '4mg'$ - take 1 tablet every 6 hours for HA as needed Ubrelvy-'50mg'$  use as needed-  Patient declined the following medications this month: Amoxicillin '875mg'$ -  [facility] already delivered   Any concerns about your medications? No  How often do you forget or accidentally miss a dose? Never  Do you use a pillbox? No  Is patient in packaging Yes  What is the date on your next pill pack? Not near packs to read date   Any concerns or issues with your packaging?satisfied    Refills requested from providers include: divalproex ER,  Confirmed delivery date of 07/03/22, advised patient that pharmacy will contact them the morning of delivery.  Recent blood pressure readings are as follows:none available   Annual wellness visit in last year? Yes Most Recent BP reading:118/64  06/15/22   Cycle dispensing form sent to [facility] PT[facility] for review.  LCharlene Brooke CPP notified  [facility] [facility] [phone]

## 2022-06-26 ENCOUNTER — Telehealth: Payer: Self-pay

## 2022-06-26 ENCOUNTER — Ambulatory Visit: Payer: Medicare Other | Admitting: Orthopedic Surgery

## 2022-06-26 NOTE — Telephone Encounter (Signed)
Nicholas [name] you saw him on 06/20/22, what are the follow up plans for this patient?

## 2022-06-26 NOTE — Telephone Encounter (Signed)
Patient called LMVM stating that he had questions about the hole in the bottom of his foot? Stated that he wanted to know what Dr [name] found out? Best contact is [phone]

## 2022-06-28 ENCOUNTER — Encounter: Payer: Self-pay | Admitting: Orthopedic Surgery

## 2022-06-28 ENCOUNTER — Ambulatory Visit (INDEPENDENT_AMBULATORY_CARE_PROVIDER_SITE_OTHER): Payer: Medicare Other | Admitting: Orthopedic Surgery

## 2022-06-28 ENCOUNTER — Telehealth: Payer: Self-pay | Admitting: Orthopedic Surgery

## 2022-06-28 DIAGNOSIS — L97521 Non-pressure chronic ulcer of other part of left foot limited to breakdown of skin: Secondary | ICD-10-CM

## 2022-06-28 NOTE — Telephone Encounter (Signed)
I called pt. Pt was placed on ABX asked how his foot was looking today and he states that its dark and discolored. Asked pt to come in this afternoon. Pt is on his way.

## 2022-06-28 NOTE — Progress Notes (Signed)
Office Visit Note   Patient: Nicholas [name]           Date of Birth: 1951/02/18           MRN: [id number] Visit Date: 06/28/2022              Requested by: [name], MD [address] [place],  [place] [address] PCP: [name], MD  Chief Complaint  Patient presents with   Left Foot - Follow-up      HPI: Patient is a 71 year old gentleman who presents follow-up for [facility] grade 1 ulcer first metatarsal head left foot.  Patient is on amoxicillin twice a day.  Patient states that his home health nurse felt the ulcer looked darker.  Assessment & Plan: Visit Diagnoses:  1. Ulcer of toe of left foot, limited to breakdown of skin ([place])     Plan: Ulcer was debrided there is healthy granulation tissue no signs of infection.  He will complete his course of antibiotics continue pressure offloading avoid exercising until this heals.  Follow-Up Instructions: Return in about 2 weeks (around 07/12/2022).   Ortho Exam  Patient is alert, oriented, no adenopathy, well-dressed, normal affect, normal respiratory effort. Examination patient has a palpable pulse.  There is no redness or cellulitis in the left foot.  He has a Wagner grade 1 ulcer beneath the first metatarsal head there is dark callused skin around the edges.  After informed consent a 10 blade knife was used to debride the skin and soft tissue back to healthy viable granulation tissue.  The ulcer is 2 cm in diameter and 1 mm deep after debridement there is no exposed bone or tendon no undermining no signs of infection.  Imaging: No results found. No images are attached to the encounter.  Labs: Lab Results  Component Value Date   HGBA1C 5.6 07/13/2010   HGBA1C  12/08/2008    5.2 (NOTE)   The ADA recommends the following therapeutic goal for glycemic   control related to Hgb A1C measurement:   Goal of Therapy:   < 7.0% Hgb A1C   Reference: American Diabetes Association: Clinical Practice   Recommendations  2008, Diabetes Care,  2008, 31:(Suppl 1).   HGBA1C  11/23/2008    5.1 (NOTE)   The ADA recommends the following therapeutic goal for glycemic   control related to Hgb A1C measurement:   Goal of Therapy:   < 7.0% Hgb A1C   Reference: American Diabetes Association: Clinical Practice   Recommendations 2008, Diabetes Care,  2008, 31:(Suppl 1).   ESRSEDRATE 14 06/15/2022   ESRSEDRATE 28 (H) 03/01/2021   ESRSEDRATE 18 08/03/2020   CRP 15.0 (H) 06/15/2022   LABURIC 8.8 (H) 03/01/2021   LABURIC 5.0 02/08/2016   REPTSTATUS 05/07/2018 FINAL 05/05/2018   GRAMSTAIN  03/29/2009    NO WBC SEEN NO SQUAMOUS EPITHELIAL CELLS SEEN NO ORGANISMS SEEN   GRAMSTAIN  03/29/2009    NO WBC SEEN NO SQUAMOUS EPITHELIAL CELLS SEEN NO ORGANISMS SEEN   CULT (A) 05/05/2018    <10,000 COLONIES/mL INSIGNIFICANT GROWTH Performed at [facility] [address], [place], [place] [address]    LABORGA STAPHYLOCOCCUS SPECIES (COAGULASE NEGATIVE) 11/21/2008     Lab Results  Component Value Date   ALBUMIN 4.2 02/19/2022   ALBUMIN 4.6 01/16/2022   ALBUMIN 4.2 07/04/2021    Lab Results  Component Value Date   MG 2.1 04/12/2016   MG 1.8 03/31/2009   MG 1.7 03/29/2009  Lab Results  Component Value Date   VD25OH 48.88 02/19/2022   VD25OH 40.71 07/04/2021   VD25OH 25.83 (L) 12/08/2020    No results found for: "PREALBUMIN"    Latest Ref Rng & Units 06/15/2022    4:35 PM 02/19/2022   12:19 PM 01/16/2022    2:15 PM  CBC EXTENDED  WBC 3.8 - 10.8 Thousand/uL 9.4  7.4  8.4   RBC 4.20 - 5.80 Million/uL 4.63  4.44  4.93   Hemoglobin 13.2 - 17.1 g/dL 15.1  14.6  15.8   HCT 38.5 - 50.0 % 44.3  43.3  45.5   Platelets 140 - 400 Thousand/uL 197  189.0  213   NEUT# 1,500 - 7,800 cells/uL 6,279  4.4  5.1   Lymph# 850 - 3,900 cells/uL 1,664  1.6  1.6      There is no height or weight on file to calculate BMI.  Orders:  No orders of the defined types were placed in this encounter.  No orders of the defined  types were placed in this encounter.    Procedures: No procedures performed  Clinical Data: No additional findings.  ROS:  All other systems negative, except as noted in the HPI. Review of Systems  Objective: Vital Signs: There were no vitals taken for this visit.  Specialty Comments:  No specialty comments available.  PMFS History: Patient Active Problem List   Diagnosis Date Noted   Open wound of foot with complication, left, initial encounter 06/16/2022   Hx of excision of lamina of lumbar vertebra for decompression of spinal cord 03/20/2022   Anemia 02/28/2022   Dysuria 02/28/2022   Peripheral edema 02/28/2022   Recurrent major depressive disorder ([place]) 02/28/2022   Low back pain 02/28/2022   Right leg pain 02/28/2022   Chronic headache 02/28/2022   Recurrent falls 08/25/2021   Memory difficulty 07/04/2021   Chronic toe ulcer, left, with fat layer exposed ([place]) 01/23/2021   Pituitary adenoma ([place]) 09/06/2020   Chronic intractable headache 07/27/2020   Vitamin B12 deficiency 03/31/2020   Vitamin D deficiency 03/31/2020   Numbness of foot 03/25/2020   Right hand pain 12/14/2019   Status post amputation of great toe, left ([place]) 12/14/2019   Callus of foot 04/02/2019   Allergy to bee sting 03/11/2019   Insomnia 02/10/2019   Status post lumbar spine operative procedure for decompression of spinal cord 12/12/2018   Dyslipidemia 10/19/2018   Foot deformity 10/19/2018   Facial rash 05/26/2018   Status post reverse total replacement of right shoulder 05/13/2018   CHF (congestive heart failure) ([place]) 12/27/2017   OSA (obstructive sleep apnea) 12/27/2017   Health maintenance examination 11/25/2017   CKD (chronic kidney disease) stage 3, GFR 30-59 ml/min (HCC) 10/01/2017   Fatty liver 10/01/2017   Status post reverse total replacement of left shoulder 09/03/2017   Bilateral shoulder pain 05/24/2017   History of CVA in adulthood 03/08/2017   S/P total knee  arthroplasty, left 01/30/2017   Osteoarthritis of knees, bilateral 01/15/2017   Skin rash 12/10/2016   Medicare annual wellness visit, subsequent 10/05/2016   Advanced care planning/counseling discussion 10/05/2016   Obesity, morbid, BMI 40.0-49.9 ([place]) 07/19/2016   Hypothyroidism    Iron deficiency 04/23/2016   Chronic pain syndrome 04/23/2016   Inflammatory arthritis 04/23/2016   Chronic leg pain 04/23/2016   Lesion of palate 04/23/2016   Grieving 04/23/2016   GERD (gastroesophageal reflux disease)    Duodenitis determined by biopsy 02/13/2016   Diverticulosis of colon without  hemorrhage 04/54/0981   Complicated grief 19/14/7829   Chest pain 01/11/2012   Migraine 01/11/2012   History of methicillin resistant staphylococcus aureus (MRSA) 07/04/2010   DDD (degenerative disc disease), lumbar 07/04/2010   History of total right knee replacement 07/04/2010   Contact dermatitis 07/04/2010   Lesion of ulnar nerve 07/04/2010   Closed fracture of malar bone ([place]) 07/04/2010   Methicillin susceptible Staphylococcus aureus infection 07/04/2010   Narrowing of intervertebral disc space 07/04/2010   Olecranon bursitis 07/04/2010   History of artificial joint 07/04/2010   Primary osteoarthritis, right shoulder 06/09/2007   Osteoarthritis 06/09/2007   Personal history of colonic adenoma 06/01/2003   Past Medical History:  Diagnosis Date   Acute kidney failure 08/2008   "cleared up"  no problems since   Allergy    Anxiety    Arthritis    Asthma    ?of this no inhaler   Blood transfusion    CHF (congestive heart failure) ([place])    ?of this, pt denies   CLOSTRIDIUM DIFFICILE COLITIS 07/04/2010   Annotation: 12/09, 2/10 Qualifier: Diagnosis of  By: [name], [name]     Depression with anxiety    Duodenitis determined by biopsy 02/2016   peptic likely due to aleve (erosive gastropathy with duodenal erosions)   ECZEMA 07/04/2010   Qualifier: Diagnosis of  By: [name], [name]      Elevated liver enzymes    Fatty liver    Full dentures    GERD (gastroesophageal reflux disease)    Heart attack ([place])    08/2008 (likely demand ischemia in the setting of MSSA sepsis/R TKA  infection)10-2008   History of hiatal hernia    History of stomach ulcers    Hypothyroidism    Lower GI bleeding    MALAR AND MAXILLARY BONES CLOSED FRACTURE 07/04/2010   Annotation: ORIF Qualifier: Diagnosis of  By: [name], [name]     Migraine    "definitely"   MRSA (methicillin resistant Staphylococcus aureus)    in leg, had to place steel rod in leg   Personal history of colonic adenoma 06/01/2003   PFO (patent foramen ovale)    small PFO by 08/2008 TEE   Pneumonia 08/2008   "while in ICU"   Seizures ([place]) 2009   "long time ago"     Sleep apnea    does not wear CPAP   Stroke ([place]) 2010   unable to complete sentences at times   Wears glasses     Family History  Problem Relation Age of Onset   Coronary artery disease Father 50   Stroke Father    CAD Mother 59       stents   Breast cancer Sister    Diabetes Brother    Diabetes Sister    Colon cancer Neg Hx    Stomach cancer Neg Hx     Past Surgical History:  Procedure Laterality Date   AMPUTATION Left 01/24/2022   Procedure: LEFT GREAT TOE AMPUTATION AT METATARSOPHALANGEAL JOINT AND SECOND TOE AMPUTATION;  Surgeon: Newt Minion, MD;  Location: [facility];  Service: Orthopedics;  Laterality: Left;   anterior  nerve transposition  07/2009   left ulnar nerve   antibiotic spacer exchange  11/2008; 08/2006   right knee   ARTHROTOMY  08/2008   right knee w/I&D   CARDIOVASCULAR STRESS TEST  2013   stress EKG - negative for ischemia, 4 min 7.3 METs, normal blood pressure response   CATARACT EXTRACTION  COLONOSCOPY  11/2012   diverticulosis, rpt 10 yrs [name])   ESOPHAGOGASTRODUODENOSCOPY  02/2016   erosive gastropathy with duodenal erosions [name])   HARDWARE REMOVAL  04/2006   right knee w/antibiotic spacers placed   INGUINAL  HERNIA REPAIR  early 1990's   bilateral   JOINT REPLACEMENT Bilateral    KNEE ARTHROSCOPY  10/2001   right   KNEE FUSION  03/2009   right knee removal; antibiotic spacers;    LEFT HEART CATH AND CORONARY ANGIOGRAPHY N/A 02/04/2018    WNL (Patwardhan, Reynold Bowen, MD)   LUMBAR LAMINECTOMY/DECOMPRESSION MICRODISCECTOMY N/A 12/05/2018   Procedure: LUMBAR THREE TO FOUR AND LUMBAR FOUR TO FIVE DECOMPRESSION;  Surgeon: [name], MD;  Location: [facility];  Service: Orthopedics;  Laterality: N/A;   MULTIPLE TOOTH EXTRACTIONS     REPLACEMENT TOTAL KNEE  08/2008; 09/2001   right   REVERSE SHOULDER ARTHROPLASTY Left 09/03/2017   Procedure: REVERSE SHOULDER ARTHROPLASTY;  Surgeon: Meredith Pel, MD;  Location: [facility];  Service: Orthopedics;  Laterality: Left;   REVERSE SHOULDER ARTHROPLASTY Right 05/13/2018   Procedure: RIGHT REVERSE SHOULDER ARTHROPLASTY;  Surgeon: Meredith Pel, MD;  Location: [facility];  Service: Orthopedics;  Laterality: Right;   rod placement Right 03/2009   R knee   SYNOVECTOMY  06/2005   debridement, liner exchange right knee   TOE AMPUTATION Left 08/2008   great toe - osteomyelitis (staph infection)   TOE AMPUTATION Left 04/2016   2nd toe [name]   TONSILLECTOMY     TOTAL KNEE ARTHROPLASTY Left 01/15/2017   TOTAL KNEE ARTHROPLASTY Left 01/15/2017   Procedure: TOTAL KNEE ARTHROPLASTY;  Surgeon: Meredith Pel, MD;  Location: [facility];  Service: Orthopedics;  Laterality: Left;   Social History   Occupational History   Occupation: retired  Tobacco Use   Smoking status: Never    Passive exposure: Past   Smokeless tobacco: Never  Vaping Use   Vaping Use: Never used  Substance and Sexual Activity   Alcohol use: No    Alcohol/week: 0.0 standard drinks of alcohol    Comment: "I abused alcohol; last drink  ~ 2005"   Drug use: No   Sexual activity: Never

## 2022-06-28 NOTE — Telephone Encounter (Signed)
Patient is calling a 2nd time.  Patient called LMVM stating that he had questions about the hole in the bottom of his foot? Stated that he wanted to know what Dr [name] found out? Best contact is [phone]

## 2022-07-02 ENCOUNTER — Emergency Department: Payer: Medicare Other

## 2022-07-02 ENCOUNTER — Emergency Department
Admission: EM | Admit: 2022-07-02 | Discharge: 2022-07-02 | Disposition: A | Discharge status: Home / Self Care | Payer: Medicare Other | Attending: Emergency Medicine | Admitting: Emergency Medicine

## 2022-07-02 ENCOUNTER — Other Ambulatory Visit: Payer: Self-pay

## 2022-07-02 ENCOUNTER — Encounter: Payer: Self-pay | Admitting: Emergency Medicine

## 2022-07-02 DIAGNOSIS — S92515A Nondisplaced fracture of proximal phalanx of left lesser toe(s), initial encounter for closed fracture: Secondary | ICD-10-CM | POA: Insufficient documentation

## 2022-07-02 DIAGNOSIS — W06XXXA Fall from bed, initial encounter: Secondary | ICD-10-CM | POA: Insufficient documentation

## 2022-07-02 DIAGNOSIS — Z96611 Presence of right artificial shoulder joint: Secondary | ICD-10-CM | POA: Diagnosis not present

## 2022-07-02 DIAGNOSIS — N183 Chronic kidney disease, stage 3 unspecified: Secondary | ICD-10-CM | POA: Insufficient documentation

## 2022-07-02 DIAGNOSIS — S0083XA Contusion of other part of head, initial encounter: Secondary | ICD-10-CM | POA: Diagnosis not present

## 2022-07-02 DIAGNOSIS — I509 Heart failure, unspecified: Secondary | ICD-10-CM | POA: Insufficient documentation

## 2022-07-02 DIAGNOSIS — S0990XA Unspecified injury of head, initial encounter: Secondary | ICD-10-CM

## 2022-07-02 DIAGNOSIS — E039 Hypothyroidism, unspecified: Secondary | ICD-10-CM | POA: Insufficient documentation

## 2022-07-02 DIAGNOSIS — Z96653 Presence of artificial knee joint, bilateral: Secondary | ICD-10-CM | POA: Diagnosis not present

## 2022-07-02 DIAGNOSIS — Z043 Encounter for examination and observation following other accident: Secondary | ICD-10-CM | POA: Diagnosis not present

## 2022-07-02 DIAGNOSIS — I130 Hypertensive heart and chronic kidney disease with heart failure and stage 1 through stage 4 chronic kidney disease, or unspecified chronic kidney disease: Secondary | ICD-10-CM | POA: Diagnosis not present

## 2022-07-02 DIAGNOSIS — I10 Essential (primary) hypertension: Secondary | ICD-10-CM | POA: Diagnosis not present

## 2022-07-02 DIAGNOSIS — R0789 Other chest pain: Secondary | ICD-10-CM | POA: Insufficient documentation

## 2022-07-02 DIAGNOSIS — S99922A Unspecified injury of left foot, initial encounter: Secondary | ICD-10-CM | POA: Diagnosis present

## 2022-07-02 DIAGNOSIS — Z96612 Presence of left artificial shoulder joint: Secondary | ICD-10-CM | POA: Insufficient documentation

## 2022-07-02 DIAGNOSIS — M79672 Pain in left foot: Secondary | ICD-10-CM | POA: Diagnosis not present

## 2022-07-02 DIAGNOSIS — S0993XA Unspecified injury of face, initial encounter: Secondary | ICD-10-CM | POA: Diagnosis not present

## 2022-07-02 DIAGNOSIS — W19XXXA Unspecified fall, initial encounter: Secondary | ICD-10-CM

## 2022-07-02 LAB — BASIC METABOLIC PANEL
Anion gap: 11 (ref 5–15)
BUN: 19 mg/dL (ref 8–23)
CO2: 29 mmol/L (ref 22–32)
Calcium: 9.1 mg/dL (ref 8.9–10.3)
Chloride: 101 mmol/L (ref 98–111)
Creatinine, Ser: 1.23 mg/dL (ref 0.61–1.24)
GFR, Estimated: >60 mL/min (ref >60)
Glucose, Bld: 96 mg/dL (ref 70–99)
Potassium: 4.1 mmol/L (ref 3.5–5.1)
Sodium: 141 mmol/L (ref 135–145)

## 2022-07-02 LAB — CBC WITH DIFFERENTIAL/PLATELET
Abs Immature Granulocytes: 0.03 10*3/uL (ref 0.00–0.07)
Basophils Absolute: 0.1 10*3/uL (ref 0.0–0.1)
Basophils Relative: 1 %
Eosinophils Absolute: 0.2 10*3/uL (ref 0.0–0.5)
Eosinophils Relative: 3 %
HCT: 47.8 % (ref 39.0–52.0)
Hemoglobin: 15.5 g/dL (ref 13.0–17.0)
Immature Granulocytes: 0 %
Lymphocytes Relative: 17 %
Lymphs Abs: 1.3 10*3/uL (ref 0.7–4.0)
MCH: 31.8 pg (ref 26.0–34.0)
MCHC: 32.4 g/dL (ref 30.0–36.0)
MCV: 98.2 fL (ref 80.0–100.0)
Monocytes Absolute: 1 10*3/uL (ref 0.1–1.0)
Monocytes Relative: 13 %
Neutro Abs: 5.2 10*3/uL (ref 1.7–7.7)
Neutrophils Relative %: 66 %
Platelets: 176 10*3/uL (ref 150–400)
RBC: 4.87 MIL/uL (ref 4.22–5.81)
RDW: 13.1 % (ref 11.5–15.5)
WBC: 7.9 10*3/uL (ref 4.0–10.5)
nRBC: 0 % (ref 0.0–0.2)

## 2022-07-02 LAB — TROPONIN I (HIGH SENSITIVITY)
Troponin I (High Sensitivity): 4 ng/L (ref <18)
Troponin I (High Sensitivity): 2 ng/L (ref <18)

## 2022-07-02 MED ORDER — IOHEXOL 300 MG/ML  SOLN: 75.0000 mL | Freq: Once | INTRAMUSCULAR | Status: DC | PRN

## 2022-07-02 NOTE — Discharge Instructions (Signed)
Your CT scans of your head, neck, and face are normal.  Your x-ray did show a broken toe.  Please wear your shoe for extra protection and follow-up with your podiatrist.  Your blood work was also normal.  Your chest x-ray was normal.  Please return for any new, worsening, or change in symptoms or other concerns.  It was a pleasure caring for you today.

## 2022-07-02 NOTE — ED Notes (Signed)
Patient stated he needed a urinal and was provided with one.

## 2022-07-02 NOTE — ED Notes (Signed)
Patient's sister was contacted. The sister stated that she contacted the son but was unaware if anyone was available to pick him up at this time. Told the sister to please call the hospital with an update as soon as she knew something,

## 2022-07-02 NOTE — ED Provider Notes (Signed)
[facility] Provider Note    Event Date/Time   First MD Initiated Contact with Patient 07/02/22 1510     (approximate)   History   Fall   HPI  Nicholas [name] is a 71 y.o. male with a past medical history of depression, recurrent falls, CKD, hypertension who presents today for evaluation after a fall.  Patient reports that he was sleeping when he rolled out of bed and fell onto the ground.  Face.  He reports that he did not lose consciousness.  He he did not hit anything on the way down.  He has not had any vomiting.  He was able to crawl to the phone and he called EMS himself.  He reports that after the fall he began to have some chest tightness.  He also notes that he recently had 3 toes removed on his left foot and wants this to be checked as well.  He denies feeling short of breath.  No vision changes.  No neck pain, back pain, abdominal pain.  No other injury sustained.  Patient Active Problem List   Diagnosis Date Noted   Open wound of foot with complication, left, initial encounter 06/16/2022   Hx of excision of lamina of lumbar vertebra for decompression of spinal cord 03/20/2022   Anemia 02/28/2022   Dysuria 02/28/2022   Peripheral edema 02/28/2022   Recurrent major depressive disorder ([place]) 02/28/2022   Low back pain 02/28/2022   Right leg pain 02/28/2022   Chronic headache 02/28/2022   Recurrent falls 08/25/2021   Memory difficulty 07/04/2021   Chronic toe ulcer, left, with fat layer exposed ([place]) 01/23/2021   Pituitary adenoma ([place]) 09/06/2020   Chronic intractable headache 07/27/2020   Vitamin B12 deficiency 03/31/2020   Vitamin D deficiency 03/31/2020   Numbness of foot 03/25/2020   Right hand pain 12/14/2019   Status post amputation of great toe, left ([place]) 12/14/2019   Callus of foot 04/02/2019   Allergy to bee sting 03/11/2019   Insomnia 02/10/2019   Status post lumbar spine operative procedure for decompression of spinal cord  12/12/2018   Dyslipidemia 10/19/2018   Foot deformity 10/19/2018   Facial rash 05/26/2018   Status post reverse total replacement of right shoulder 05/13/2018   CHF (congestive heart failure) ([place]) 12/27/2017   OSA (obstructive sleep apnea) 12/27/2017   Health maintenance examination 11/25/2017   CKD (chronic kidney disease) stage 3, GFR 30-59 ml/min (HCC) 10/01/2017   Fatty liver 10/01/2017   Status post reverse total replacement of left shoulder 09/03/2017   Bilateral shoulder pain 05/24/2017   History of CVA in adulthood 03/08/2017   S/P total knee arthroplasty, left 01/30/2017   Osteoarthritis of knees, bilateral 01/15/2017   Skin rash 12/10/2016   Medicare annual wellness visit, subsequent 10/05/2016   Advanced care planning/counseling discussion 10/05/2016   Obesity, morbid, BMI 40.0-49.9 ([place]) 07/19/2016   Hypothyroidism    Iron deficiency 04/23/2016   Chronic pain syndrome 04/23/2016   Inflammatory arthritis 04/23/2016   Chronic leg pain 04/23/2016   Lesion of palate 04/23/2016   Grieving 04/23/2016   GERD (gastroesophageal reflux disease)    Duodenitis determined by biopsy 02/13/2016   Diverticulosis of colon without hemorrhage 75/64/3329   Complicated grief 51/88/4166   Chest pain 01/11/2012   Migraine 01/11/2012   History of methicillin resistant staphylococcus aureus (MRSA) 07/04/2010   DDD (degenerative disc disease), lumbar 07/04/2010   History of total right knee replacement 07/04/2010   Contact dermatitis 07/04/2010  Lesion of ulnar nerve 07/04/2010   Closed fracture of malar bone (HCC) 07/04/2010   Methicillin susceptible Staphylococcus aureus infection 07/04/2010   Narrowing of intervertebral disc space 07/04/2010   Olecranon bursitis 07/04/2010   History of artificial joint 07/04/2010   Primary osteoarthritis, right shoulder 06/09/2007   Osteoarthritis 06/09/2007   Personal history of colonic adenoma 06/01/2003         Physical Exam   Triage  Vital Signs: ED Triage Vitals  Enc Vitals Group     BP 07/02/22 1420 134/73     Pulse Rate 07/02/22 1420 (!) 56     Resp 07/02/22 1420 18     Temp 07/02/22 1420 98 F (36.7 C)     Temp Source 07/02/22 1420 Oral     SpO2 07/02/22 1420 96 %     Weight 07/02/22 1418 266 lb 1.5 oz (120.7 kg)     Height 07/02/22 1418 '5\' 8"'$  (1.727 m)     Head Circumference --      Peak Flow --      Pain Score 07/02/22 1417 6     Pain Loc --      Pain Edu? --      Excl. in [place]? --     Most recent vital signs: Vitals:   07/02/22 1420  BP: 134/73  Pulse: (!) 56  Resp: 18  Temp: 98 F (36.7 C)  SpO2: 96%    Physical Exam Vitals and nursing note reviewed.  Constitutional:      General: Awake and alert. No acute distress.    Appearance: Normal appearance. The patient is overweight.  HENT:     Head: Normocephalic.  Superficial abrasions noted to right periorbital area.  Full and normal extraocular movements.  No hyphema or hypopyon.  No malocclusion.  No trismus.  No midface tenderness.  No swelling or ecchymosis noted.  No battle sign or raccoon eyes    Mouth: Mucous membranes are moist.  Eyes:     General: PERRL. Normal EOMs        Right eye: No discharge.        Left eye: No discharge.     Conjunctiva/sclera: Conjunctivae normal.  Cardiovascular:     Rate and Rhythm: Normal rate.     Pulses: Normal pulses.  Pulmonary:     Effort: Pulmonary effort is normal. No respiratory distress.     Breath sounds: Normal breath sounds.  Mild anterior chest wall tenderness without ecchymosis or wounds noted Abdominal:     Abdomen is soft. There is no abdominal tenderness. No rebound or guarding. No distention.  No abdominal wall ecchymosis Musculoskeletal:        General: No swelling. Normal range of motion.     Cervical back: Normal range of motion and neck supple Left lower extremity: No skin changes or tenderness noted.  Third, fourth and fifth toes remain only.  Normal capillary refill.  Foot is warm  and well-perfused.  No tenderness to foot, ankle, knee, hip.  No swelling or ecchymosis.  No open wounds.  No medial or lateral malleolar tenderness.  No tenderness along tib-fib or knee Skin:    General: Skin is warm and dry.     Capillary Refill: Capillary refill takes less than 2 seconds.     Findings: No rash.  Neurological:     Mental Status: The patient is awake and alert.  Neurological: GCS 15 alert and oriented x3 Normal speech, no expressive or receptive aphasia or dysarthria Cranial  nerves II through XII intact Normal visual fields 5 out of 5 strength in all 4 extremities with intact sensation throughout No extremity drift Normal finger-to-nose testing, no limb or truncal ataxia     ED Results / Procedures / Treatments   Labs (all labs ordered are listed, but only abnormal results are displayed) Labs Reviewed  CBC WITH DIFFERENTIAL/PLATELET  BASIC METABOLIC PANEL  TROPONIN I (HIGH SENSITIVITY)  TROPONIN I (HIGH SENSITIVITY)     EKG     RADIOLOGY I independently reviewed and interpreted imaging and agree with radiologists findings.     PROCEDURES:  Critical Care performed:   Procedures   MEDICATIONS ORDERED IN ED: Medications  iohexol (OMNIPAQUE) 300 MG/ML solution 75 mL (has no administration in time range)     IMPRESSION / MDM / ASSESSMENT AND PLAN / ED COURSE  I reviewed the triage vital signs and the nursing notes.   Differential diagnosis includes, but is not limited to, contusion, abrasion, intracranial hemorrhage, cervical spine injury, myocardial infarction, chest wall contusion.  Patient is awake and alert, hemodynamically stable and afebrile.  CT head and neck obtained per [place] criteria, as well as CT face given his obvious injury to his face.  Due to his reported chest tightness, EKG and labs were obtained as well as chest x-ray.  CT head, neck, face did not demonstrate any acute findings.  He was noted to have an articular fracture  involving the proximal phalanx of the third toe.  He was offered a cam boot, however he reports that he has one of these at home and does not want a new one.  He knows to put this on as soon as he gets home, and his son is picking him up so he does not have to bear weight.  He was instructed to follow-up with his podiatrist.  He does not have any physical exam findings of arterial occlusion or infection.  His surgical sites appear to be healing quite well.  No erythema, warmth, or tenderness.  His labs were normal including troponin x2.  Chest x-ray did not reveal any acute abnormalities.  We discussed all of his results and the importance of close outpatient follow-up.  Patient understands and agrees with plan.  He was discharged in stable condition.   Patient's presentation is most consistent with acute complicated illness / injury requiring diagnostic workup.      FINAL CLINICAL IMPRESSION(S) / ED DIAGNOSES   Final diagnoses:  Fall, initial encounter  Injury of head, initial encounter  Contusion of face, initial encounter  Closed nondisplaced fracture of proximal phalanx of lesser toe of left foot, initial encounter     Rx / DC Orders   ED Discharge Orders     None        Note:  This document was prepared using Dragon voice recognition software and may include unintentional dictation errors.   Emeline Gins 07/02/22 1845    Carrie Mew, MD 07/03/22 2337

## 2022-07-02 NOTE — ED Triage Notes (Signed)
Patient arrived by EMS from  home after rolling out of bed. Denies LOC.   EMS vitals: 138/68 b/p 14 RR 55 pulse

## 2022-07-02 NOTE — ED Triage Notes (Signed)
Pt was getting out of bed and hit right side of face on floor. Pt denies any LOC. Pt complains of facial pain 6/10. Pt is able to move mouth open and closed with difficulty. Pt has abrasion to right cheeck and above right eye.

## 2022-07-06 ENCOUNTER — Telehealth: Payer: Self-pay | Admitting: Family Medicine

## 2022-07-06 ENCOUNTER — Ambulatory Visit: Payer: Medicare Other | Admitting: Family

## 2022-07-06 NOTE — Telephone Encounter (Signed)
I sent a Community Msg to [employer] asking for status of order.  Waiting for response.

## 2022-07-06 NOTE — Telephone Encounter (Signed)
Patient called in to receive update on receiving a hospital bed,as he discussed with Dr Darnell Level.

## 2022-07-09 ENCOUNTER — Telehealth: Payer: Self-pay

## 2022-07-09 NOTE — Telephone Encounter (Signed)
Per response from [facility], they have everything they need for the order and are processing it for the pt.  Spoke with pt relaying info above.  States [facility] has already reached out to him and are getting things set up.

## 2022-07-09 NOTE — Chronic Care Management (AMB) (Signed)
Chronic Care Management Pharmacy Assistant   Name: SAM OVERBECK  MRN: [id number] DOB: 01-06-1951  Reason for Encounter: Hospital Follow Up    Medications: Outpatient Encounter Medications as of 07/09/2022  Medication Sig Note   acetaminophen (TYLENOL) 500 MG tablet Take 1,000 mg by mouth 3 (three) times daily.    amitriptyline (ELAVIL) 50 MG tablet TAKE TWO TABLETS BY MOUTH EVERYDAY AT BEDTIME (Patient taking differently: Take 100 mg by mouth at bedtime.)    atorvastatin (LIPITOR) 20 MG tablet TAKE ONE TABLET BY MOUTH ONCE DAILY    B Complex Vitamins (B-COMPLEX/B-12 PO) Take 1 tablet by mouth daily.    Cholecalciferol (VITAMIN D3) 25 MCG (1000 UT) CAPS Take 1 capsule (1,000 Units total) by mouth daily.    clotrimazole (LOTRIMIN) 1 % cream Apply 1 application topically 2 (two) times daily. To groin (Patient taking differently: Apply 1 application  topically 2 (two) times daily as needed (rash).)    divalproex (DEPAKOTE ER) 500 MG 24 hr tablet TAKE ONE TABLET BY MOUTH EVERYDAY AT BEDTIME    EPINEPHrine 0.3 mg/0.3 mL IJ SOAJ injection Inject 0.3 mg into the muscle as needed for anaphylaxis. (bee stings)    ferrous sulfate 325 (65 FE) MG EC tablet Take 325 mg by mouth daily with breakfast.    furosemide (LASIX) 20 MG tablet TAKE ONE TABLET BY MOUTH EVERY MORNING    gabapentin (NEURONTIN) 300 MG capsule TAKE ONE CAPSULE BY MOUTH EVERY MORNING and TAKE TWO CAPSULES BY MOUTH AT NOON and TAKE TWO CAPSULES BY MOUTH EVERY EVENING    ketoconazole (NIZORAL) 2 % shampoo Apply 1 application topically 3 (three) times a week.    levothyroxine (SYNTHROID) 112 MCG tablet Take 1 tablet (112 mcg total) by mouth daily before breakfast.    methocarbamol (ROBAXIN) 500 MG tablet Take 1 tablet (500 mg total) by mouth 2 (two) times daily as needed for muscle spasms.    metoprolol succinate (TOPROL-XL) 100 MG 24 hr tablet TAKE ONE TABLET BY MOUTH EVERYDAY AT BEDTIME    nitroGLYCERIN (NITROSTAT) 0.4 MG SL tablet  Place 1 tablet (0.4 mg total) under the tongue every 5 (five) minutes as needed for chest pain.    ondansetron (ZOFRAN-ODT) 4 MG disintegrating tablet TAKE ONE TABLET BY MOUTH EVERY 8 HOURS AS NEEDED    pantoprazole (PROTONIX) 40 MG tablet Take 1 tablet (40 mg total) by mouth every other day.    predniSONE (STERAPRED UNI-PAK 21 TAB) 10 MG (21) TBPK tablet Take as directed    tiZANidine (ZANAFLEX) 4 MG tablet TAKE ONE TABLET BY MOUTH every SIX hours AS NEEDED FOR acute HEADACHE    Ubrogepant (UBRELVY) 50 MG TABS Take 50 mg by mouth daily as needed. (Patient taking differently: Take 50 mg by mouth daily as needed.) 01/19/2022: New RX   No facility-administered encounter medications on file as of 07/09/2022.     Reviewed hospital notes for details of recent visit. Has patient been contacted by Transitions of Care team? No Has patient seen PCP/specialist for hospital follow up (summarize OV if yes): Yes 07/06/22- Telephone call regarding DME  Admitted to the ED on 07/02/22. Discharge date was 07/02/22.  Discharged from [facility] ED Discharge diagnosis (Principal Problem): Fall Patient was discharged to Home  Brief summary of hospital course: CT head, neck, face did not demonstrate any acute findings.  He was noted to have an articular fracture involving the proximal phalanx of the third toe.  He was offered a cam boot, however  he reports that he has one of these at home and does not want a new one.  He knows to put this on as soon as he gets home, and his son is picking him up so he does not have to bear weight.  He was instructed to follow-up with his podiatrist.  He does not have any physical exam findings of arterial occlusion or infection.  His surgical sites appear to be healing quite well.  No erythema, warmth, or tenderness.  His labs were normal including troponin x2.  Chest x-ray did not reveal any acute abnormalities.   Medications that remain the same after Hospital Discharge:??  -All other  medications will remain the same.    Next CCM appt: none  Other upcoming appts: PCP appointment on 07/12/22  fam med, 08/22/22  PCP  [name], PharmD notified and will determine if action is needed.   [name], [place]  [phone]

## 2022-07-10 ENCOUNTER — Telehealth: Payer: Self-pay

## 2022-07-10 ENCOUNTER — Ambulatory Visit: Payer: Medicare Other | Admitting: Orthopedic Surgery

## 2022-07-10 NOTE — Telephone Encounter (Signed)
     Patient  visit on 9/18  at [facility] ED   Have you been able to follow up with your primary care physician?yes  The patient was or was not able to obtain any needed medicine or equipment.yes  Are there diet recommendations that you are having difficulty following? na  Patient expresses understanding of discharge instructions and education provided has no other needs at this time. yes     [place], Care Management  [phone] [address], [place], [place] [address] Phone: [phone] Email: [email or web address]

## 2022-07-12 ENCOUNTER — Ambulatory Visit (INDEPENDENT_AMBULATORY_CARE_PROVIDER_SITE_OTHER): Payer: Medicare Other | Admitting: Orthopedic Surgery

## 2022-07-12 ENCOUNTER — Encounter: Payer: Self-pay | Admitting: Family

## 2022-07-12 ENCOUNTER — Ambulatory Visit (INDEPENDENT_AMBULATORY_CARE_PROVIDER_SITE_OTHER): Payer: Medicare Other | Admitting: Family

## 2022-07-12 VITALS — BP 124/76 | HR 64 | Temp 98.4°F | Resp 16 | Ht 68.0 in | Wt 251.4 lb

## 2022-07-12 DIAGNOSIS — J029 Acute pharyngitis, unspecified: Secondary | ICD-10-CM

## 2022-07-12 DIAGNOSIS — Z89412 Acquired absence of left great toe: Secondary | ICD-10-CM

## 2022-07-12 DIAGNOSIS — B370 Candidal stomatitis: Secondary | ICD-10-CM | POA: Diagnosis not present

## 2022-07-12 DIAGNOSIS — L97521 Non-pressure chronic ulcer of other part of left foot limited to breakdown of skin: Secondary | ICD-10-CM

## 2022-07-12 DIAGNOSIS — Z20822 Contact with and (suspected) exposure to covid-19: Secondary | ICD-10-CM

## 2022-07-12 LAB — POCT RAPID STREP A (OFFICE): Rapid Strep A Screen: NEGATIVE

## 2022-07-12 LAB — POC COVID19 BINAXNOW: SARS Coronavirus 2 Ag: NEGATIVE

## 2022-07-12 MED ORDER — AMOXICILLIN-POT CLAVULANATE 875-125 MG PO TABS: 1.0000 | ORAL_TABLET | Freq: Two times a day (BID) | ORAL | 0 refills | Status: DC

## 2022-07-12 MED ORDER — NYSTATIN 100000 UNIT/ML MT SUSP: 5.0000 mL | Freq: Four times a day (QID) | OROMUCOSAL | 0 refills | Status: DC

## 2022-07-12 MED ORDER — AMOXICILLIN 500 MG PO CAPS: 500.0000 mg | ORAL_CAPSULE | Freq: Two times a day (BID) | ORAL | 0 refills | Status: DC

## 2022-07-12 NOTE — Progress Notes (Signed)
Established Patient Office Visit  Subjective:  Patient ID: Nicholas [name], male    DOB: [birth year]  Age: 71 y.o. MRN: [id number]  CC:  Chief Complaint  Patient presents with   Sore Throat    HPI Nicholas [name] is here today with concerns.   Started about three days ago with sore throat and hurts to swallow.  Does have slight cough no chest congestion.  No heartburn. No nasal congestion. No fever.  More tired than usual. No ear pain.   Past Medical History:  Diagnosis Date   Acute kidney failure 08/2008   "cleared up"  no problems since   Allergy    Anxiety    Arthritis    Asthma    ?of this no inhaler   Blood transfusion    CHF (congestive heart failure) ([place])    ?of this, pt denies   CLOSTRIDIUM DIFFICILE COLITIS 07/04/2010   Annotation: 12/09, 2/10 Qualifier: Diagnosis of  By: [name], [name]     Depression with anxiety    Duodenitis determined by biopsy 02/2016   peptic likely due to aleve (erosive gastropathy with duodenal erosions)   ECZEMA 07/04/2010   Qualifier: Diagnosis of  By: [name], [name]     Elevated liver enzymes    Fatty liver    Full dentures    GERD (gastroesophageal reflux disease)    Heart attack ([place])    08/2008 (likely demand ischemia in the setting of MSSA sepsis/R TKA  infection)10-2008   History of hiatal hernia    History of stomach ulcers    Hypothyroidism    Lower GI bleeding    MALAR AND MAXILLARY BONES CLOSED FRACTURE 07/04/2010   Annotation: ORIF Qualifier: Diagnosis of  By: [name], [name]     Migraine    "definitely"   MRSA (methicillin resistant Staphylococcus aureus)    in leg, had to place steel rod in leg   Personal history of colonic adenoma 06/01/2003   PFO (patent foramen ovale)    small PFO by 08/2008 TEE   Pneumonia 08/2008   "while in ICU"   Seizures ([place]) 2009   "long time ago"     Sleep apnea    does not wear CPAP   Stroke ([place]) 2010   unable to complete sentences at times   Wears glasses     Past  Surgical History:  Procedure Laterality Date   AMPUTATION Left 01/24/2022   Procedure: LEFT GREAT TOE AMPUTATION AT METATARSOPHALANGEAL JOINT AND SECOND TOE AMPUTATION;  Surgeon: Newt Minion, MD;  Location: [place];  Service: Orthopedics;  Laterality: Left;   anterior  nerve transposition  07/2009   left ulnar nerve   antibiotic spacer exchange  11/2008; 08/2006   right knee   ARTHROTOMY  08/2008   right knee w/I&D   CARDIOVASCULAR STRESS TEST  2013   stress EKG - negative for ischemia, 4 min 7.3 METs, normal blood pressure response   CATARACT EXTRACTION     COLONOSCOPY  11/2012   diverticulosis, rpt 10 yrs [name])   ESOPHAGOGASTRODUODENOSCOPY  02/2016   erosive gastropathy with duodenal erosions [name])   HARDWARE REMOVAL  04/2006   right knee w/antibiotic spacers placed   INGUINAL HERNIA REPAIR  early 1990's   bilateral   JOINT REPLACEMENT Bilateral    KNEE ARTHROSCOPY  10/2001   right   KNEE FUSION  03/2009   right knee removal; antibiotic spacers;    LEFT HEART CATH AND CORONARY ANGIOGRAPHY N/A 02/04/2018  WNL (Patwardhan, Reynold Bowen, MD)   LUMBAR LAMINECTOMY/DECOMPRESSION MICRODISCECTOMY N/A 12/05/2018   Procedure: LUMBAR THREE TO FOUR AND LUMBAR FOUR TO FIVE DECOMPRESSION;  Surgeon: [name], MD;  Location: [place];  Service: Orthopedics;  Laterality: N/A;   MULTIPLE TOOTH EXTRACTIONS     REPLACEMENT TOTAL KNEE  08/2008; 09/2001   right   REVERSE SHOULDER ARTHROPLASTY Left 09/03/2017   Procedure: REVERSE SHOULDER ARTHROPLASTY;  Surgeon: Meredith Pel, MD;  Location: [place];  Service: Orthopedics;  Laterality: Left;   REVERSE SHOULDER ARTHROPLASTY Right 05/13/2018   Procedure: RIGHT REVERSE SHOULDER ARTHROPLASTY;  Surgeon: Meredith Pel, MD;  Location: [place];  Service: Orthopedics;  Laterality: Right;   rod placement Right 03/2009   R knee   SYNOVECTOMY  06/2005   debridement, liner exchange right knee   TOE AMPUTATION Left 08/2008   great toe - osteomyelitis  (staph infection)   TOE AMPUTATION Left 04/2016   2nd toe [name]   TONSILLECTOMY     TOTAL KNEE ARTHROPLASTY Left 01/15/2017   TOTAL KNEE ARTHROPLASTY Left 01/15/2017   Procedure: TOTAL KNEE ARTHROPLASTY;  Surgeon: Meredith Pel, MD;  Location: [place];  Service: Orthopedics;  Laterality: Left;    Family History  Problem Relation Age of Onset   Coronary artery disease Father 50   Stroke Father    CAD Mother 50       stents   Breast cancer Sister    Diabetes Brother    Diabetes Sister    Colon cancer Neg Hx    Stomach cancer Neg Hx     Social History   Socioeconomic History   Marital status: Widowed    Spouse name: Not on file   Number of children: 2   Years of education: 53   Highest education level: Not on file  Occupational History   Occupation: retired  Tobacco Use   Smoking status: Never    Passive exposure: Past   Smokeless tobacco: Never  Vaping Use   Vaping Use: Never used  Substance and Sexual Activity   Alcohol use: No    Alcohol/week: 0.0 standard drinks of alcohol    Comment: "I abused alcohol; last drink  ~ 2005"   Drug use: No   Sexual activity: Not Currently  Other Topics Concern   Not on file  Social History Narrative   Widower 2016 - wife passed from ESRD related illness   Lives in [place] in [place].    Mormon      Screened positive for OSA 04/2018   Social Determinants of Health   Financial Resource Strain: Medium Risk (02/28/2022)   Overall Financial Resource Strain (CARDIA)    Difficulty of Paying Living Expenses: Somewhat hard  Food Insecurity: No Food Insecurity (02/28/2022)   Hunger Vital Sign    Worried About Running Out of Food in the Last Year: Never true    Ran Out of Food in the Last Year: Never true  Transportation Needs: No Transportation Needs (02/28/2022)   PRAPARE - Hydrologist (Medical): No    Lack of Transportation (Non-Medical): No  Physical Activity: Insufficiently Active  (02/28/2022)   Exercise Vital Sign    Days of Exercise per Week: 2 days    Minutes of Exercise per Session: 20 min  Stress: No Stress Concern Present (02/28/2022)   [place]    Feeling of Stress : Not at all  Social Connections:  Moderately Isolated (02/28/2022)   Social Connection and Isolation Panel [NHANES]    Frequency of Communication with Friends and Family: More than three times a week    Frequency of Social Gatherings with Friends and Family: Once a week    Attends Religious Services: More than 4 times per year    Active Member of [employer] or Organizations: No    Attends Archivist Meetings: Never    Marital Status: Widowed  Intimate Partner Violence: Not At Risk (02/28/2022)   Humiliation, Afraid, Rape, and Kick questionnaire    Fear of Current or Ex-Partner: No    Emotionally Abused: No    Physically Abused: No    Sexually Abused: No    Outpatient Medications Prior to Visit  Medication Sig Dispense Refill   acetaminophen (TYLENOL) 500 MG tablet Take 1,000 mg by mouth 3 (three) times daily.     amitriptyline (ELAVIL) 50 MG tablet TAKE TWO TABLETS BY MOUTH EVERYDAY AT BEDTIME (Patient taking differently: Take 100 mg by mouth at bedtime.) 180 tablet 2   atorvastatin (LIPITOR) 20 MG tablet TAKE ONE TABLET BY MOUTH ONCE DAILY 90 tablet 3   B Complex Vitamins (B-COMPLEX/B-12 PO) Take 1 tablet by mouth daily.     Cholecalciferol (VITAMIN D3) 25 MCG (1000 UT) CAPS Take 1 capsule (1,000 Units total) by mouth daily. 90 capsule 1   clotrimazole (LOTRIMIN) 1 % cream Apply 1 application topically 2 (two) times daily. To groin (Patient taking differently: Apply 1 application  topically 2 (two) times daily as needed (rash).) 60 g 1   divalproex (DEPAKOTE ER) 500 MG 24 hr tablet TAKE ONE TABLET BY MOUTH EVERYDAY AT BEDTIME 30 tablet 5   EPINEPHrine 0.3 mg/0.3 mL IJ SOAJ injection Inject 0.3 mg into the muscle as needed  for anaphylaxis. (bee stings) 2 each 1   ferrous sulfate 325 (65 FE) MG EC tablet Take 325 mg by mouth daily with breakfast.     furosemide (LASIX) 20 MG tablet TAKE ONE TABLET BY MOUTH EVERY MORNING 90 tablet 1   gabapentin (NEURONTIN) 300 MG capsule TAKE ONE CAPSULE BY MOUTH EVERY MORNING and TAKE TWO CAPSULES BY MOUTH AT NOON and TAKE TWO CAPSULES BY MOUTH EVERY EVENING 450 capsule 1   ketoconazole (NIZORAL) 2 % shampoo Apply 1 application topically 3 (three) times a week. 120 mL 3   levothyroxine (SYNTHROID) 112 MCG tablet Take 1 tablet (112 mcg total) by mouth daily before breakfast. 90 tablet 3   methocarbamol (ROBAXIN) 500 MG tablet Take 1 tablet (500 mg total) by mouth 2 (two) times daily as needed for muscle spasms. 20 tablet 2   metoprolol succinate (TOPROL-XL) 100 MG 24 hr tablet TAKE ONE TABLET BY MOUTH EVERYDAY AT BEDTIME 90 tablet 3   nitroGLYCERIN (NITROSTAT) 0.4 MG SL tablet Place 1 tablet (0.4 mg total) under the tongue every 5 (five) minutes as needed for chest pain. 20 tablet 3   ondansetron (ZOFRAN-ODT) 4 MG disintegrating tablet TAKE ONE TABLET BY MOUTH EVERY 8 HOURS AS NEEDED 20 tablet 3   pantoprazole (PROTONIX) 40 MG tablet Take 1 tablet (40 mg total) by mouth every other day.     predniSONE (STERAPRED UNI-PAK 21 TAB) 10 MG (21) TBPK tablet Take as directed 21 tablet 0   tiZANidine (ZANAFLEX) 4 MG tablet TAKE ONE TABLET BY MOUTH every SIX hours AS NEEDED FOR acute HEADACHE 20 tablet 1   Ubrogepant (UBRELVY) 50 MG TABS Take 50 mg by mouth daily as needed. (Patient  taking differently: Take 50 mg by mouth daily as needed.) 12 tablet 11   No facility-administered medications prior to visit.    Allergies  Allergen Reactions   Peanut-Containing Drug Products Anaphylaxis and Dermatitis   Yellow Jacket Venom [Bee Venom] Anaphylaxis and Other (See Comments)    Respiratory Distress   Celebrex [Celecoxib] Other (See Comments)    BLACK STOOL?MELENA?   Percocet  [Oxycodone-Acetaminophen] Hives, Swelling and Other (See Comments)   Latex Rash   Nsaids Rash        Objective:    Physical Exam Vitals reviewed.  Constitutional:      General: He is awake. He is not in acute distress.    Appearance: Normal appearance. He is well-developed. He is obese. He is not ill-appearing, toxic-appearing or diaphoretic.  HENT:     Right Ear: Tympanic membrane normal.     Left Ear: Tympanic membrane normal.     Nose: Nose normal.     Right Turbinates: Not enlarged or swollen.     Left Turbinates: Not enlarged or swollen.     Right Sinus: No maxillary sinus tenderness or frontal sinus tenderness.     Left Sinus: No maxillary sinus tenderness or frontal sinus tenderness.     Mouth/Throat:     Mouth: Mucous membranes are moist.     Pharynx: Posterior oropharyngeal erythema present. No pharyngeal swelling or oropharyngeal exudate.     Comments: Uvula with erythema Eyes:     Extraocular Movements: Extraocular movements intact.     Pupils: Pupils are equal, round, and reactive to light.  Cardiovascular:     Rate and Rhythm: Normal rate and regular rhythm.  Pulmonary:     Effort: Pulmonary effort is normal.     Breath sounds: Normal breath sounds. No wheezing.  Neurological:     Mental Status: He is alert.     BP 124/76   Pulse 64   Temp 98.4 F (36.9 C)   Resp 16   Ht 5' 8"  (1.727 m)   Wt 251 lb 6 oz (114 kg)   SpO2 96%   BMI 38.22 kg/m  Wt Readings from Last 3 Encounters:  07/12/22 251 lb 6 oz (114 kg)  07/02/22 266 lb 1.5 oz (120.7 kg)  03/20/22 266 lb (120.7 kg)     Health Maintenance Due  Topic Date Due   COVID-19 Vaccine (1) Never done   TETANUS/TDAP  01/31/2016   INFLUENZA VACCINE  05/15/2022    There are no preventive care reminders to display for this patient.  Lab Results  Component Value Date   TSH 6.40 (H) 02/19/2022   Lab Results  Component Value Date   WBC 7.9 07/02/2022   HGB 15.5 07/02/2022   HCT 47.8 07/02/2022    MCV 98.2 07/02/2022   PLT 176 07/02/2022   Lab Results  Component Value Date   NA 141 07/02/2022   K 4.1 07/02/2022   CO2 29 07/02/2022   GLUCOSE 96 07/02/2022   BUN 19 07/02/2022   CREATININE 1.23 07/02/2022   BILITOT 1.1 02/19/2022   ALKPHOS 60 02/19/2022   AST 25 02/19/2022   ALT 21 02/19/2022   PROT 7.0 02/19/2022   ALBUMIN 4.2 02/19/2022   CALCIUM 9.1 07/02/2022   ANIONGAP 11 07/02/2022   EGFR 55 (L) 01/16/2022   GFR 48.17 (L) 02/19/2022   Lab Results  Component Value Date   HGBA1C 5.6 07/13/2010      Assessment & Plan:   Problem List Items Addressed This  Visit       Respiratory   Pharyngitis    rx augmentin 875/125 mg po bid x 10 days Take antibiotic as prescribed. Increase oral fluids. Pt to f/u if sx worsen and or fail to improve in 2-3 days.       Relevant Medications   amoxicillin-clavulanate (AUGMENTIN) 875-125 MG tablet     Digestive   Oral candida    rx nystatin 5 ml        Relevant Medications   nystatin (MYCOSTATIN) 100000 UNIT/ML suspension   Other Visit Diagnoses     Sore throat    -  Primary   Relevant Orders   POCT rapid strep A (Completed)   Exposure to COVID-19 virus       Relevant Orders   POC COVID-19 BinaxNow (Completed)       Meds ordered this encounter  Medications   DISCONTD: amoxicillin (AMOXIL) 500 MG capsule    Sig: Take 1 capsule (500 mg total) by mouth 2 (two) times daily for 10 days.    Dispense:  20 capsule    Refill:  0    Order Specific Question:   Supervising Provider    Answer:   BEDSOLE, AMY E [[id number]]   nystatin (MYCOSTATIN) 100000 UNIT/ML suspension    Sig: Take 5 mLs (500,000 Units total) by mouth 4 (four) times daily.    Dispense:  60 mL    Refill:  0    Order Specific Question:   Supervising Provider    Answer:   BEDSOLE, AMY E [[id number]]   amoxicillin-clavulanate (AUGMENTIN) 875-125 MG tablet    Sig: Take 1 tablet by mouth 2 (two) times daily.    Dispense:  20 tablet    Refill:  0    Order  Specific Question:   Supervising Provider    Answer:   BEDSOLE, AMY E [[id number]]    Follow-up: No follow-ups on file.    Eugenia Pancoast, FNP

## 2022-07-13 ENCOUNTER — Telehealth: Payer: Self-pay | Admitting: Family Medicine

## 2022-07-13 NOTE — Telephone Encounter (Signed)
Nicholas [name] called from [facility] stated they received two medications amoxicillin-clavulanate (AUGMENTIN) 875-125 MG tablet wanted to confirm if this is the right medication. Call back number [phone].

## 2022-07-13 NOTE — Telephone Encounter (Signed)
Called pharmacy and gave them clarification

## 2022-07-13 NOTE — Telephone Encounter (Signed)
I only see Augmentin rx in current med list.  Spoke with Ander Purpura of [facility] to get clarification of message.  States they had originally received rx for amoxicillin then rx for Augmentin.  They just wanted to make sure Augmentin is the correct rx needing to be filled.  Plz advise.

## 2022-07-13 NOTE — Telephone Encounter (Signed)
Augmentin, not amoxicillin. Please let them know clarification. Thank you.

## 2022-07-15 DIAGNOSIS — J029 Acute pharyngitis, unspecified: Secondary | ICD-10-CM | POA: Insufficient documentation

## 2022-07-15 DIAGNOSIS — B370 Candidal stomatitis: Secondary | ICD-10-CM | POA: Insufficient documentation

## 2022-07-15 NOTE — Assessment & Plan Note (Signed)
rx augmentin 875/125 mg po bid x 10 days Take antibiotic as prescribed. Increase oral fluids. Pt to f/u if sx worsen and or fail to improve in 2-3 days.  

## 2022-07-15 NOTE — Assessment & Plan Note (Signed)
rx nystatin 5 ml

## 2022-07-19 ENCOUNTER — Other Ambulatory Visit: Payer: Self-pay | Admitting: Neurology

## 2022-07-20 ENCOUNTER — Telehealth: Payer: Self-pay

## 2022-07-20 NOTE — Chronic Care Management (AMB) (Unsigned)
Chronic Care Management Pharmacy Assistant   Name: Nicholas [name]  MRN: [id number] DOB: 03/26/1951  Reason for Encounter: Medication Adherence and Delivery Coordination   Recent office visits:  07/12/22-Tabitha Dugal,FNP(fam med)-sore throat,RX for augmentin 875-'125mg'$  and nystatin  88m.  Recent consult visits:  07/12/22-[name],MD(ortho)-[facility]  None in previous 6 months  Medications: Outpatient Encounter Medications as of 07/20/2022  Medication Sig Note   acetaminophen (TYLENOL) 500 MG tablet Take 1,000 mg by mouth 3 (three) times daily.    amitriptyline (ELAVIL) 50 MG tablet TAKE TWO TABLETS BY MOUTH EVERYDAY AT BEDTIME (Patient taking differently: Take 100 mg by mouth at bedtime.)    amoxicillin-clavulanate (AUGMENTIN) 875-125 MG tablet Take 1 tablet by mouth 2 (two) times daily.    atorvastatin (LIPITOR) 20 MG tablet TAKE ONE TABLET BY MOUTH ONCE DAILY    B Complex Vitamins (B-COMPLEX/B-12 PO) Take 1 tablet by mouth daily.    Cholecalciferol (VITAMIN D3) 25 MCG (1000 UT) CAPS Take 1 capsule (1,000 Units total) by mouth daily.    clotrimazole (LOTRIMIN) 1 % cream Apply 1 application topically 2 (two) times daily. To groin (Patient taking differently: Apply 1 application  topically 2 (two) times daily as needed (rash).)    divalproex (DEPAKOTE ER) 500 MG 24 hr tablet TAKE ONE TABLET BY MOUTH EVERYDAY AT BEDTIME    EPINEPHrine 0.3 mg/0.3 mL IJ SOAJ injection Inject 0.3 mg into the muscle as needed for anaphylaxis. (bee stings)    ferrous sulfate 325 (65 FE) MG EC tablet Take 325 mg by mouth daily with breakfast.    furosemide (LASIX) 20 MG tablet TAKE ONE TABLET BY MOUTH EVERY MORNING    gabapentin (NEURONTIN) 300 MG capsule TAKE ONE CAPSULE BY MOUTH EVERY MORNING and TAKE TWO CAPSULES BY MOUTH AT NOON and TAKE TWO CAPSULES BY MOUTH EVERY EVENING    ketoconazole (NIZORAL) 2 % shampoo Apply 1 application topically 3 (three) times a week.     levothyroxine (SYNTHROID) 112 MCG tablet Take 1 tablet (112 mcg total) by mouth daily before breakfast.    methocarbamol (ROBAXIN) 500 MG tablet Take 1 tablet (500 mg total) by mouth 2 (two) times daily as needed for muscle spasms.    metoprolol succinate (TOPROL-XL) 100 MG 24 hr tablet TAKE ONE TABLET BY MOUTH EVERYDAY AT BEDTIME    nitroGLYCERIN (NITROSTAT) 0.4 MG SL tablet Place 1 tablet (0.4 mg total) under the tongue every 5 (five) minutes as needed for chest pain.    nystatin (MYCOSTATIN) 100000 UNIT/ML suspension Take 5 mLs (500,000 Units total) by mouth 4 (four) times daily.    ondansetron (ZOFRAN-ODT) 4 MG disintegrating tablet TAKE ONE TABLET BY MOUTH EVERY 8 HOURS AS NEEDED    pantoprazole (PROTONIX) 40 MG tablet Take 1 tablet (40 mg total) by mouth every other day.    predniSONE (STERAPRED UNI-PAK 21 TAB) 10 MG (21) TBPK tablet Take as directed    tiZANidine (ZANAFLEX) 4 MG tablet TAKE ONE TABLET BY MOUTH every SIX hours AS NEEDED FOR acute HEADACHE    Ubrogepant (UBRELVY) 50 MG TABS Take 50 mg by mouth daily as needed. (Patient taking differently: Take 50 mg by mouth daily as needed.) 01/19/2022: New RX   No facility-administered encounter medications on file as of 07/20/2022.   BP Readings from Last 3 Encounters:  07/12/22 124/76  07/02/22 128/74  06/15/22 118/64    Lab Results  Component Value Date   HGBA1C 5.6 07/13/2010  Recent OV, Consult or Hospital visit:  Recent medication changes indicated:   Family Medicine, antibiotic for sore throat.   Last adherence delivery date:07/03/22      Patient is due for next adherence delivery on: 08/01/22  Multiple attempts made to reach patient. Unsuccessful outreach. Will refill based off of last adherence fill.   This delivery to include: Adherence Packaging  30 Days  Packs: Gabapentin '300mg'$  - take 1 tablet breakfast, 2 tablets lunch,2 tablets evening meal Furosemide '20mg'$  - take 1 tablet daily breakfast  daily Pantoprazole  '40mg'$  - take 1 tablet at breakfast Atorvastatin '20mg'$  - take 1 tablet at breakfast Vitamin D3 63mg - take 1 capsule breakfast Ferrous sulfate 324 mg - take 1 tablet at breakfast  daily  Metoprolol succinate '100mg'$  - take 1 tablet at bedtime  Levothyroxine 112 mcg - take 1 tablet at breakfast Amitriptyline '50mg'$  - take 2 tablets at bedtime Divalproex '500mg'$ - take 1 tablet bedtime   VIAL medications: Zofran '4mg'$ - take 1 tablet as needed for nausea Methocarbamol '500mg'$ - take 1 tablet 2 times daily as needed  Tizanidine '4mg'$ - take 1 tablet every 6 hours for HA as needed Ubrelvy-'50mg'$  use as needed-  Patient declined the following medications this month: none   Refills requested from providers include: Tizanidine  Delivery scheduled for 08/01/22. Unable to speak with patient to confirm date.     Annual wellness visit in last year? Yes Most Recent BP reading:118/64  [facility].  LCharlene Brooke CPP notified  [place] [phone]

## 2022-07-24 ENCOUNTER — Encounter: Payer: Self-pay | Admitting: Orthopedic Surgery

## 2022-07-24 ENCOUNTER — Telehealth: Payer: Self-pay | Admitting: Family Medicine

## 2022-07-24 ENCOUNTER — Ambulatory Visit (INDEPENDENT_AMBULATORY_CARE_PROVIDER_SITE_OTHER): Payer: Medicare Other | Admitting: Neurology

## 2022-07-24 ENCOUNTER — Encounter: Payer: Self-pay | Admitting: Neurology

## 2022-07-24 VITALS — BP 111/68 | HR 60 | Ht 68.0 in | Wt 238.0 lb

## 2022-07-24 DIAGNOSIS — G43109 Migraine with aura, not intractable, without status migrainosus: Secondary | ICD-10-CM | POA: Diagnosis not present

## 2022-07-24 MED ORDER — AJOVY 225 MG/1.5ML ~~LOC~~ SOAJ: 225.0000 mg | SUBCUTANEOUS | 11 refills | Status: DC

## 2022-07-24 MED ORDER — UBRELVY 100 MG PO TABS: 100.0000 mg | ORAL_TABLET | ORAL | 11 refills | Status: DC | PRN

## 2022-07-24 NOTE — Progress Notes (Signed)
Office Visit Note   Patient: Nicholas [name]           Date of Birth: August 08, 1951           MRN: [id number] Visit Date: 07/12/2022              Requested by: [name], MD [address] [place],  [place] [address] PCP: [name], MD  Chief Complaint  Patient presents with   Left Foot - Wound Check      HPI: Patient is a 71 year old gentleman who presents with a Wagner grade 1 ulcer left foot first metatarsal head currently using a walker for ambulation.  Assessment & Plan: Visit Diagnoses:  1. Left great toe amputee ([place])   2. Ulcer of toe of left foot, limited to breakdown of skin ([place])     Plan: Recommended Achilles stretching on the left ulcer debrided x1 he may advance to a regular sneaker.  Follow-Up Instructions: Return in about 3 months (around 10/11/2022).   Ortho Exam  Patient is alert, oriented, no adenopathy, well-dressed, normal affect, normal respiratory effort. Examination patient has a Wagner grade 1 ulcer beneath the first metatarsal head left foot.  After informed consent a 10 blade knife was used to debride the skin and soft tissue back to healthy granulation tissue.  The ulcer is 10 mm in diameter 2 mm deep after debridement there is no tunneling no exposed bone or tendon.  Imaging: No results found. No images are attached to the encounter.  Labs: Lab Results  Component Value Date   HGBA1C 5.6 07/13/2010   HGBA1C  12/08/2008    5.2 (NOTE)   The ADA recommends the following therapeutic goal for glycemic   control related to Hgb A1C measurement:   Goal of Therapy:   < 7.0% Hgb A1C   Reference: American Diabetes Association: Clinical Practice   Recommendations 2008, Diabetes Care,  2008, 31:(Suppl 1).   HGBA1C  11/23/2008    5.1 (NOTE)   The ADA recommends the following therapeutic goal for glycemic   control related to Hgb A1C measurement:   Goal of Therapy:   < 7.0% Hgb A1C   Reference: American Diabetes Association: Clinical  Practice   Recommendations 2008, Diabetes Care,  2008, 31:(Suppl 1).   ESRSEDRATE 14 06/15/2022   ESRSEDRATE 28 (H) 03/01/2021   ESRSEDRATE 18 08/03/2020   CRP 15.0 (H) 06/15/2022   LABURIC 8.8 (H) 03/01/2021   LABURIC 5.0 02/08/2016   REPTSTATUS 05/07/2018 FINAL 05/05/2018   GRAMSTAIN  03/29/2009    NO WBC SEEN NO SQUAMOUS EPITHELIAL CELLS SEEN NO ORGANISMS SEEN   GRAMSTAIN  03/29/2009    NO WBC SEEN NO SQUAMOUS EPITHELIAL CELLS SEEN NO ORGANISMS SEEN   CULT (A) 05/05/2018    <10,000 COLONIES/mL INSIGNIFICANT GROWTH Performed at [facility] [address] [place], [place] [address]    LABORGA STAPHYLOCOCCUS SPECIES (COAGULASE NEGATIVE) 11/21/2008     Lab Results  Component Value Date   ALBUMIN 4.2 02/19/2022   ALBUMIN 4.6 01/16/2022   ALBUMIN 4.2 07/04/2021    Lab Results  Component Value Date   MG 2.1 04/12/2016   MG 1.8 03/31/2009   MG 1.7 03/29/2009   Lab Results  Component Value Date   VD25OH 48.88 02/19/2022   VD25OH 40.71 07/04/2021   VD25OH 25.83 (L) 12/08/2020    No results found for: "PREALBUMIN"    Latest Ref Rng & Units 07/02/2022    3:20 PM 06/15/2022  4:35 PM 02/19/2022   12:19 PM  CBC EXTENDED  WBC 4.0 - 10.5 K/uL 7.9  9.4  7.4   RBC 4.22 - 5.81 MIL/uL 4.87  4.63  4.44   Hemoglobin 13.0 - 17.0 g/dL 15.5  15.1  14.6   HCT 39.0 - 52.0 % 47.8  44.3  43.3   Platelets 150 - 400 K/uL 176  197  189.0   NEUT# 1.7 - 7.7 K/uL 5.2  6,279  4.4   Lymph# 0.7 - 4.0 K/uL 1.3  1,664  1.6      There is no height or weight on file to calculate BMI.  Orders:  No orders of the defined types were placed in this encounter.  No orders of the defined types were placed in this encounter.    Procedures: No procedures performed  Clinical Data: No additional findings.  ROS:  All other systems negative, except as noted in the HPI. Review of Systems  Objective: Vital Signs: There were no vitals taken for this visit.  Specialty Comments:  No  specialty comments available.  PMFS History: Patient Active Problem List   Diagnosis Date Noted   Oral candida 07/15/2022   Pharyngitis 07/15/2022   Open wound of foot with complication, left, initial encounter 06/16/2022   Hx of excision of lamina of lumbar vertebra for decompression of spinal cord 03/20/2022   Anemia 02/28/2022   Dysuria 02/28/2022   Peripheral edema 02/28/2022   Recurrent major depressive disorder ([place]) 02/28/2022   Chronic headache 02/28/2022   Recurrent falls 08/25/2021   Memory difficulty 07/04/2021   Chronic toe ulcer, left, with fat layer exposed ([place]) 01/23/2021   Pituitary adenoma ([place]) 09/06/2020   Chronic intractable headache 07/27/2020   Vitamin B12 deficiency 03/31/2020   Vitamin D deficiency 03/31/2020   Numbness of foot 03/25/2020   Status post amputation of great toe, left ([place]) 12/14/2019   Callus of foot 04/02/2019   Allergy to bee sting 03/11/2019   Insomnia 02/10/2019   Status post lumbar spine operative procedure for decompression of spinal cord 12/12/2018   Dyslipidemia 10/19/2018   Foot deformity 10/19/2018   Status post reverse total replacement of right shoulder 05/13/2018   CHF (congestive heart failure) ([place]) 12/27/2017   OSA (obstructive sleep apnea) 12/27/2017   CKD (chronic kidney disease) stage 3, GFR 30-59 ml/min (HCC) 10/01/2017   Fatty liver 10/01/2017   Status post reverse total replacement of left shoulder 09/03/2017   Bilateral shoulder pain 05/24/2017   History of CVA in adulthood 03/08/2017   S/P total knee arthroplasty, left 01/30/2017   Osteoarthritis of knees, bilateral 01/15/2017   Obesity, morbid, BMI 40.0-49.9 ([place]) 07/19/2016   Hypothyroidism    Iron deficiency 04/23/2016   Chronic pain syndrome 04/23/2016   Inflammatory arthritis 04/23/2016   Chronic leg pain 04/23/2016   Lesion of palate 04/23/2016   Grieving 04/23/2016   GERD (gastroesophageal reflux disease)    Duodenitis determined by biopsy  02/13/2016   Diverticulosis of colon without hemorrhage 16/10/930   Complicated grief 35/57/3220   Migraine 01/11/2012   History of methicillin resistant staphylococcus aureus (MRSA) 07/04/2010   DDD (degenerative disc disease), lumbar 07/04/2010   History of total right knee replacement 07/04/2010   Contact dermatitis 07/04/2010   Lesion of ulnar nerve 07/04/2010   Closed fracture of malar bone ([place]) 07/04/2010   Methicillin susceptible Staphylococcus aureus infection 07/04/2010   Narrowing of intervertebral disc space 07/04/2010   Olecranon bursitis 07/04/2010   History of artificial joint 07/04/2010  Primary osteoarthritis, right shoulder 06/09/2007   Osteoarthritis 06/09/2007   Personal history of colonic adenoma 06/01/2003   Past Medical History:  Diagnosis Date   Acute kidney failure 08/2008   "cleared up"  no problems since   Allergy    Anxiety    Arthritis    Asthma    ?of this no inhaler   Blood transfusion    CHF (congestive heart failure) ([place])    ?of this, pt denies   CLOSTRIDIUM DIFFICILE COLITIS 07/04/2010   Annotation: 12/09, 2/10 Qualifier: Diagnosis of  By: [name], [name]     Depression with anxiety    Duodenitis determined by biopsy 02/2016   peptic likely due to aleve (erosive gastropathy with duodenal erosions)   ECZEMA 07/04/2010   Qualifier: Diagnosis of  By: [name], [name]     Elevated liver enzymes    Fatty liver    Full dentures    GERD (gastroesophageal reflux disease)    Heart attack ([place])    08/2008 (likely demand ischemia in the setting of MSSA sepsis/R TKA  infection)10-2008   History of hiatal hernia    History of stomach ulcers    Hypothyroidism    Lower GI bleeding    MALAR AND MAXILLARY BONES CLOSED FRACTURE 07/04/2010   Annotation: ORIF Qualifier: Diagnosis of  By: [name], [name]     Migraine    "definitely"   MRSA (methicillin resistant Staphylococcus aureus)    in leg, had to place steel rod in leg   Personal history  of colonic adenoma 06/01/2003   PFO (patent foramen ovale)    small PFO by 08/2008 TEE   Pneumonia 08/2008   "while in ICU"   Seizures ([place]) 2009   "long time ago"     Sleep apnea    does not wear CPAP   Stroke ([place]) 2010   unable to complete sentences at times   Wears glasses     Family History  Problem Relation Age of Onset   Coronary artery disease Father 80   Stroke Father    CAD Mother 50       stents   Breast cancer Sister    Diabetes Brother    Diabetes Sister    Colon cancer Neg Hx    Stomach cancer Neg Hx     Past Surgical History:  Procedure Laterality Date   AMPUTATION Left 01/24/2022   Procedure: LEFT GREAT TOE AMPUTATION AT METATARSOPHALANGEAL JOINT AND SECOND TOE AMPUTATION;  Surgeon: Newt Minion, MD;  Location: [facility];  Service: Orthopedics;  Laterality: Left;   anterior  nerve transposition  07/2009   left ulnar nerve   antibiotic spacer exchange  11/2008; 08/2006   right knee   ARTHROTOMY  08/2008   right knee w/I&D   CARDIOVASCULAR STRESS TEST  2013   stress EKG - negative for ischemia, 4 min 7.3 METs, normal blood pressure response   CATARACT EXTRACTION     COLONOSCOPY  11/2012   diverticulosis, rpt 10 yrs [name])   ESOPHAGOGASTRODUODENOSCOPY  02/2016   erosive gastropathy with duodenal erosions [name])   HARDWARE REMOVAL  04/2006   right knee w/antibiotic spacers placed   INGUINAL HERNIA REPAIR  early 1990's   bilateral   JOINT REPLACEMENT Bilateral    KNEE ARTHROSCOPY  10/2001   right   KNEE FUSION  03/2009   right knee removal; antibiotic spacers;    LEFT HEART CATH AND CORONARY ANGIOGRAPHY N/A 02/04/2018    WNL (Patwardhan, Manish  J, MD)   LUMBAR LAMINECTOMY/DECOMPRESSION MICRODISCECTOMY N/A 12/05/2018   Procedure: LUMBAR THREE TO FOUR AND LUMBAR FOUR TO FIVE DECOMPRESSION;  Surgeon: [name], MD;  Location: [facility];  Service: Orthopedics;  Laterality: N/A;   MULTIPLE TOOTH EXTRACTIONS     REPLACEMENT TOTAL KNEE  08/2008; 09/2001   right    REVERSE SHOULDER ARTHROPLASTY Left 09/03/2017   Procedure: REVERSE SHOULDER ARTHROPLASTY;  Surgeon: Meredith Pel, MD;  Location: [facility];  Service: Orthopedics;  Laterality: Left;   REVERSE SHOULDER ARTHROPLASTY Right 05/13/2018   Procedure: RIGHT REVERSE SHOULDER ARTHROPLASTY;  Surgeon: Meredith Pel, MD;  Location: [facility];  Service: Orthopedics;  Laterality: Right;   rod placement Right 03/2009   R knee   SYNOVECTOMY  06/2005   debridement, liner exchange right knee   TOE AMPUTATION Left 08/2008   great toe - osteomyelitis (staph infection)   TOE AMPUTATION Left 04/2016   2nd toe [name]   TONSILLECTOMY     TOTAL KNEE ARTHROPLASTY Left 01/15/2017   TOTAL KNEE ARTHROPLASTY Left 01/15/2017   Procedure: TOTAL KNEE ARTHROPLASTY;  Surgeon: Meredith Pel, MD;  Location: [facility];  Service: Orthopedics;  Laterality: Left;   Social History   Occupational History   Occupation: retired  Tobacco Use   Smoking status: Never    Passive exposure: Past   Smokeless tobacco: Never  Vaping Use   Vaping Use: Never used  Substance and Sexual Activity   Alcohol use: No    Alcohol/week: 0.0 standard drinks of alcohol    Comment: "I abused alcohol; last drink  ~ 2005"   Drug use: No   Sexual activity: Not Currently

## 2022-07-24 NOTE — Patient Instructions (Addendum)
For now continue the Depakote, start Ajovy for migraine prevention which is once a month injection, will require PA Increase Ubrelvy to 100 mg at onset of headache for treatment  See you back in 4 months  If you need me call me, if headaches aren't doing well

## 2022-07-24 NOTE — Progress Notes (Signed)
HISTORICAL  Nicholas [name] is a 71 year old male, seen in request by his primary care physician Dr. [name] no frequent headaches, initial evaluation was on September 29, 2020.  I reviewed and summarized the referring note. hypothyroidism, on supplement,  HLD CHF CAD, Stroke in 2018, right side weakness Right knee replacement MRSA in 2009, surgery in 2010,  Left ulnar nerve transposition surgery  Bilateral shoulder replacement.  He reported a history of migraine headaches since 71 years old, his typical migraine is holoacranial severe pounding headache with associated light, noise sensitivity, nauseous, over the years, he was treated with different over-the-counter medications, and triptan treatment without significant improvement, in his 28s, he was put on different preventive medications as well, there was no significant change per patient  He reported a history of heart attack, stroke, also had a history of difficult right knee replacement, complicated by prolonged MRSA infection, require prolonged antibiotic treatment.  He now complains of 1 to twice migraine headache each week, bilateral temporal retro-orbital region, sharp, moderate severe pain, with associated light, noise sensitivity, nauseous, lasting 1 to few days, Imitrex 100 mg provides limited help,  I personally reviewed MRI of the brain with and without contrast in November 2021, 10 mm hypoenhancing lesion within the left side of the pituitary gland likely representing a pituitary micro adenoma. Mild deviation of the pituitary stalk to the right. No suprasellar component.  Mild supratentorium small vessel disease,  He denies auras with his migraine  Update January 05, 2021 SS: Here today alone, lives in senior living apartment. Added Depakote ER 500 mg at bedtime last time, remains on gabapentin, metoprolol. Migraines used to be 1-2 a week, now 1 every 2-3 weeks. The Roselyn Meier works quickly, takes Zofran, tizanidine.   Labs reviewed from PCP February 2022, CMP looked okay.   Update January 16, 2022 SS: Here today alone, stopped the Depakote, unsure why or when (Dec 2022 note from pharmacy he was off > 6 months wasn't taking correctly). Right now 2-3 migraines a month, takes Tylenol for headache without help, lays down, can take a few days to pass. On amitriptyline for sleep; also gabapentin, Toprol-XL that could benefit migraines . Right now allergies trigger migraines. CT head in May 2022 no acute problem, stable. Seeing [name] about foot ulcer left, may need amputation of great toe.   Update July 24, 2022 SS: Taking Depakote ER 500 mg daily for migraine, is about 50% helpful. Still having about 2-3 migraines a month, can last a few days. The Roselyn Meier helps for few hours, then returns, clusters over a few days. Reports he has noted occasional body jerks since being on Depakote, side effect? No tremor was noted.  REVIEW OF SYSTEMS: Full 14 system review of systems performed and notable only for as above.  See HPI  ALLERGIES: Allergies  Allergen Reactions   Peanut-Containing Drug Products Anaphylaxis and Dermatitis   Yellow Jacket Venom [Bee Venom] Anaphylaxis and Other (See Comments)    Respiratory Distress   Celebrex [Celecoxib] Other (See Comments)    BLACK STOOL?MELENA?   Percocet [Oxycodone-Acetaminophen] Hives, Swelling and Other (See Comments)   Latex Rash   Nsaids Rash    HOME MEDICATIONS: Current Outpatient Medications  Medication Sig Dispense Refill   acetaminophen (TYLENOL) 500 MG tablet Take 1,000 mg by mouth 3 (three) times daily.     amitriptyline (ELAVIL) 50 MG tablet TAKE TWO TABLETS BY MOUTH EVERYDAY AT BEDTIME (Patient taking differently: Take 100 mg by mouth at  bedtime.) 180 tablet 2   amoxicillin-clavulanate (AUGMENTIN) 875-125 MG tablet Take 1 tablet by mouth 2 (two) times daily. 20 tablet 0   atorvastatin (LIPITOR) 20 MG tablet TAKE ONE TABLET BY MOUTH ONCE DAILY 90 tablet 3   B  Complex Vitamins (B-COMPLEX/B-12 PO) Take 1 tablet by mouth daily.     Cholecalciferol (VITAMIN D3) 25 MCG (1000 UT) CAPS Take 1 capsule (1,000 Units total) by mouth daily. 90 capsule 1   clotrimazole (LOTRIMIN) 1 % cream Apply 1 application topically 2 (two) times daily. To groin (Patient taking differently: Apply 1 application  topically 2 (two) times daily as needed (rash).) 60 g 1   divalproex (DEPAKOTE ER) 500 MG 24 hr tablet TAKE ONE TABLET BY MOUTH EVERYDAY AT BEDTIME 30 tablet 5   EPINEPHrine 0.3 mg/0.3 mL IJ SOAJ injection Inject 0.3 mg into the muscle as needed for anaphylaxis. (bee stings) 2 each 1   ferrous sulfate 325 (65 FE) MG EC tablet Take 325 mg by mouth daily with breakfast.     Fremanezumab-vfrm (AJOVY) 225 MG/1.5ML SOAJ Inject 225 mg into the skin every 30 (thirty) days. 1.68 mL 11   furosemide (LASIX) 20 MG tablet TAKE ONE TABLET BY MOUTH EVERY MORNING 90 tablet 1   gabapentin (NEURONTIN) 300 MG capsule TAKE ONE CAPSULE BY MOUTH EVERY MORNING and TAKE TWO CAPSULES BY MOUTH AT NOON and TAKE TWO CAPSULES BY MOUTH EVERY EVENING 450 capsule 1   ketoconazole (NIZORAL) 2 % shampoo Apply 1 application topically 3 (three) times a week. 120 mL 3   levothyroxine (SYNTHROID) 112 MCG tablet Take 1 tablet (112 mcg total) by mouth daily before breakfast. 90 tablet 3   methocarbamol (ROBAXIN) 500 MG tablet Take 1 tablet (500 mg total) by mouth 2 (two) times daily as needed for muscle spasms. 20 tablet 2   metoprolol succinate (TOPROL-XL) 100 MG 24 hr tablet TAKE ONE TABLET BY MOUTH EVERYDAY AT BEDTIME 90 tablet 3   nitroGLYCERIN (NITROSTAT) 0.4 MG SL tablet Place 1 tablet (0.4 mg total) under the tongue every 5 (five) minutes as needed for chest pain. 20 tablet 3   nystatin (MYCOSTATIN) 100000 UNIT/ML suspension Take 5 mLs (500,000 Units total) by mouth 4 (four) times daily. 60 mL 0   ondansetron (ZOFRAN-ODT) 4 MG disintegrating tablet TAKE ONE TABLET BY MOUTH EVERY 8 HOURS AS NEEDED 20 tablet  3   pantoprazole (PROTONIX) 40 MG tablet Take 1 tablet (40 mg total) by mouth every other day.     predniSONE (STERAPRED UNI-PAK 21 TAB) 10 MG (21) TBPK tablet Take as directed 21 tablet 0   tiZANidine (ZANAFLEX) 4 MG tablet TAKE ONE TABLET BY MOUTH every SIX hours AS NEEDED FOR acute HEADACHE 20 tablet 1   Ubrogepant (UBRELVY) 100 MG TABS Take 100 mg by mouth as needed (take 1 tablet at onset of headache, may repeat in hours, max is 200 mg in 24 hours). 12 tablet 11   No current facility-administered medications for this visit.    PAST MEDICAL HISTORY: Past Medical History:  Diagnosis Date   Acute kidney failure 08/2008   "cleared up"  no problems since   Allergy    Anxiety    Arthritis    Asthma    ?of this no inhaler   Blood transfusion    CHF (congestive heart failure) ([place])    ?of this, pt denies   CLOSTRIDIUM DIFFICILE COLITIS 07/04/2010   Annotation: 12/09, 2/10 Qualifier: Diagnosis of  By: [name], Jenny Reichmann  Depression with anxiety    Duodenitis determined by biopsy 02/2016   peptic likely due to aleve (erosive gastropathy with duodenal erosions)   ECZEMA 07/04/2010   Qualifier: Diagnosis of  By: [name], [name]     Elevated liver enzymes    Fatty liver    Full dentures    GERD (gastroesophageal reflux disease)    Heart attack ([place])    08/2008 (likely demand ischemia in the setting of MSSA sepsis/R TKA  infection)10-2008   History of hiatal hernia    History of stomach ulcers    Hypothyroidism    Lower GI bleeding    MALAR AND MAXILLARY BONES CLOSED FRACTURE 07/04/2010   Annotation: ORIF Qualifier: Diagnosis of  By: [name], [name]     Migraine    "definitely"   MRSA (methicillin resistant Staphylococcus aureus)    in leg, had to place steel rod in leg   Personal history of colonic adenoma 06/01/2003   PFO (patent foramen ovale)    small PFO by 08/2008 TEE   Pneumonia 08/2008   "while in ICU"   Seizures ([place]) 2009   "long time ago"     Sleep apnea     does not wear CPAP   Stroke ([place]) 2010   unable to complete sentences at times   Wears glasses     PAST SURGICAL HISTORY: Past Surgical History:  Procedure Laterality Date   AMPUTATION Left 01/24/2022   Procedure: LEFT GREAT TOE AMPUTATION AT METATARSOPHALANGEAL JOINT AND SECOND TOE AMPUTATION;  Surgeon: Newt Minion, MD;  Location: [facility];  Service: Orthopedics;  Laterality: Left;   anterior  nerve transposition  07/2009   left ulnar nerve   antibiotic spacer exchange  11/2008; 08/2006   right knee   ARTHROTOMY  08/2008   right knee w/I&D   CARDIOVASCULAR STRESS TEST  2013   stress EKG - negative for ischemia, 4 min 7.3 METs, normal blood pressure response   CATARACT EXTRACTION     COLONOSCOPY  11/2012   diverticulosis, rpt 10 yrs [name])   ESOPHAGOGASTRODUODENOSCOPY  02/2016   erosive gastropathy with duodenal erosions [name])   HARDWARE REMOVAL  04/2006   right knee w/antibiotic spacers placed   INGUINAL HERNIA REPAIR  early 1990's   bilateral   JOINT REPLACEMENT Bilateral    KNEE ARTHROSCOPY  10/2001   right   KNEE FUSION  03/2009   right knee removal; antibiotic spacers;    LEFT HEART CATH AND CORONARY ANGIOGRAPHY N/A 02/04/2018    WNL (Patwardhan, Reynold Bowen, MD)   LUMBAR LAMINECTOMY/DECOMPRESSION MICRODISCECTOMY N/A 12/05/2018   Procedure: LUMBAR THREE TO FOUR AND LUMBAR FOUR TO FIVE DECOMPRESSION;  Surgeon: [name], MD;  Location: [facility];  Service: Orthopedics;  Laterality: N/A;   MULTIPLE TOOTH EXTRACTIONS     REPLACEMENT TOTAL KNEE  08/2008; 09/2001   right   REVERSE SHOULDER ARTHROPLASTY Left 09/03/2017   Procedure: REVERSE SHOULDER ARTHROPLASTY;  Surgeon: Meredith Pel, MD;  Location: [facility];  Service: Orthopedics;  Laterality: Left;   REVERSE SHOULDER ARTHROPLASTY Right 05/13/2018   Procedure: RIGHT REVERSE SHOULDER ARTHROPLASTY;  Surgeon: Meredith Pel, MD;  Location: [facility];  Service: Orthopedics;  Laterality: Right;   rod placement Right 03/2009    R knee   SYNOVECTOMY  06/2005   debridement, liner exchange right knee   TOE AMPUTATION Left 08/2008   great toe - osteomyelitis (staph infection)   TOE AMPUTATION Left 04/2016   2nd toe [name]  TONSILLECTOMY     TOTAL KNEE ARTHROPLASTY Left 01/15/2017   TOTAL KNEE ARTHROPLASTY Left 01/15/2017   Procedure: TOTAL KNEE ARTHROPLASTY;  Surgeon: Meredith Pel, MD;  Location: [facility];  Service: Orthopedics;  Laterality: Left;    FAMILY HISTORY: Family History  Problem Relation Age of Onset   Coronary artery disease Father 53   Stroke Father    CAD Mother 51       stents   Breast cancer Sister    Diabetes Brother    Diabetes Sister    Colon cancer Neg Hx    Stomach cancer Neg Hx     SOCIAL HISTORY: Social History   Socioeconomic History   Marital status: Widowed    Spouse name: Not on file   Number of children: 2   Years of education: 79   Highest education level: Not on file  Occupational History   Occupation: retired  Tobacco Use   Smoking status: Never    Passive exposure: Past   Smokeless tobacco: Never  Vaping Use   Vaping Use: Never used  Substance and Sexual Activity   Alcohol use: No    Alcohol/week: 0.0 standard drinks of alcohol    Comment: "I abused alcohol; last drink  ~ 2005"   Drug use: No   Sexual activity: Not Currently  Other Topics Concern   Not on file  Social History Narrative   Widower 2016 - wife passed from ESRD related illness   Lives in [place] in [place].    Mormon      Screened positive for OSA 04/2018   Social Determinants of Health   Financial Resource Strain: Medium Risk (02/28/2022)   Overall Financial Resource Strain (CARDIA)    Difficulty of Paying Living Expenses: Somewhat hard  Food Insecurity: No Food Insecurity (02/28/2022)   Hunger Vital Sign    Worried About Running Out of Food in the Last Year: Never true    Ran Out of Food in the Last Year: Never true  Transportation Needs: No Transportation Needs  (02/28/2022)   PRAPARE - Hydrologist (Medical): No    Lack of Transportation (Non-Medical): No  Physical Activity: Insufficiently Active (02/28/2022)   Exercise Vital Sign    Days of Exercise per Week: 2 days    Minutes of Exercise per Session: 20 min  Stress: No Stress Concern Present (02/28/2022)   [place]    Feeling of Stress : Not at all  Social Connections: Moderately Isolated (02/28/2022)   Social Connection and Isolation Panel [NHANES]    Frequency of Communication with Friends and Family: More than three times a week    Frequency of Social Gatherings with Friends and Family: Once a week    Attends Religious Services: More than 4 times per year    Active Member of [employer] or Organizations: No    Attends Archivist Meetings: Never    Marital Status: Widowed  Intimate Partner Violence: Not At Risk (02/28/2022)   Humiliation, Afraid, Rape, and Kick questionnaire    Fear of Current or Ex-Partner: No    Emotionally Abused: No    Physically Abused: No    Sexually Abused: No   PHYSICAL EXAM   Vitals:   07/24/22 1421  BP: 111/68  Pulse: 60  Weight: 238 lb (108 kg)  Height: '5\' 8"'$  (1.727 m)     Not recorded    Body mass index is 36.19  kg/m.  Physical Exam  General: The patient is alert and cooperative at the time of the examination.  Neurologic Exam  Mental status: The patient is alert and oriented x 3 at the time of the examination. The patient has apparent normal recent and remote memory, with an apparently normal attention span and concentration ability.  Cranial nerves: Facial symmetry is present. Speech is normal, no aphasia or dysarthria is noted. Extraocular movements are full. Visual fields are full.  Motor: The patient has good strength in all 4 extremities.  Sensory examination: Soft touch sensation is symmetric on the face, arms, and  legs.  Coordination: The patient has good finger-nose-finger bilaterally, trouble lifting legs   Gait and station: Gait is wide based, cautious, relies on rolling walker  DIAGNOSTIC DATA (LABS, IMAGING, TESTING) - I reviewed patient records, labs, notes, testing and imaging myself where available.  ASSESSMENT AND PLAN  Nicholas [name] is a 71 y.o. male    1. Chronic migraine headache  -Increase in headache, worry about side effect of potential jerking with Depakote, will not increase, would like to taper off in future  -Add on CGRP Ajovy for migraine prevention -Increase Ubrelvy 100 mg PRN for acute headache  -Also on gabapentin, amitriptyline, metoprolol that could benefit headache  -MRI of brain showed 10 mm hypodensity pituitary microadenoma, less likely contributed to his lifelong history of migraine headaches-CT head September 2023 showed no acute abnormality -Follow-up in 4 months for recheck, sooner if needed   Butler Denmark, Laqueta Jean, [facility] [address], [place], [place] [address] [phone]

## 2022-07-25 ENCOUNTER — Encounter: Payer: Self-pay | Admitting: Nurse Practitioner

## 2022-07-25 ENCOUNTER — Ambulatory Visit (INDEPENDENT_AMBULATORY_CARE_PROVIDER_SITE_OTHER): Payer: Medicare Other | Admitting: Nurse Practitioner

## 2022-07-25 VITALS — BP 110/76 | HR 57 | Temp 97.7°F | Resp 16 | Ht 68.0 in | Wt 249.0 lb

## 2022-07-25 DIAGNOSIS — J029 Acute pharyngitis, unspecified: Secondary | ICD-10-CM | POA: Diagnosis not present

## 2022-07-25 DIAGNOSIS — R0982 Postnasal drip: Secondary | ICD-10-CM | POA: Insufficient documentation

## 2022-07-25 MED ORDER — FLUTICASONE PROPIONATE 50 MCG/ACT NA SUSP: 2.0000 | Freq: Every day | NASAL | 0 refills | Status: DC

## 2022-07-25 MED ORDER — CETIRIZINE HCL 10 MG PO TABS: 10.0000 mg | ORAL_TABLET | Freq: Every day | ORAL | 0 refills | Status: DC

## 2022-07-25 NOTE — Assessment & Plan Note (Signed)
Zyrtec and Flonase as directed.  Fluticasone precautions reviewed

## 2022-07-25 NOTE — Assessment & Plan Note (Signed)
Secondary to postnasal drip.  Patient prescribed Zyrtec and Flonase.  Fluticasone precautions reviewed follow-up if no improvement

## 2022-07-25 NOTE — Progress Notes (Signed)
Acute Office Visit  Subjective:     Patient ID: Nicholas [name], male    DOB: 1950-12-22, 71 y.o.   MRN: [id number]  Chief Complaint  Patient presents with   Sore Throat    Saw Tabitha on 07/12/22-was treated with Augmentin and felt better but now throat hurts more again on 07/19/22.      Patient is in today for sore throat  Patient was seen on 07/12/2022 and was written augmentin 875-'125mg'$  BID for 10 days. He was tested for covid and strep on the last office visit and both test came back negative He has completed the course of medication. States that he finished the abx on thursday, he thinks. He was taking the medication 4 times a day patient was also prescribed nystatin oral solution that he is still taking.  States that this past Thursday his sympotms started back with a sore throat No sick contacts States that it was kinda of scratchy No covid vaccines   Review of Systems  Constitutional:  Positive for malaise/fatigue. Negative for chills and fever.  HENT:  Positive for sore throat (scratchy). Negative for ear discharge, ear pain and sinus pain.   Respiratory:  Positive for cough (before he got sick), sputum production and shortness of breath (with exterion for approx 2 weeks).   Neurological:  Positive for headaches.        Objective:    BP 110/76   Pulse (!) 57   Temp 97.7 F (36.5 C)   Resp 16   Ht '5\' 8"'$  (1.727 m)   Wt 249 lb (112.9 kg)   SpO2 98%   BMI 37.86 kg/m    Physical Exam Constitutional:      Appearance: Normal appearance. He is obese.  HENT:     Right Ear: Tympanic membrane, ear canal and external ear normal.     Left Ear: Tympanic membrane, ear canal and external ear normal.     Mouth/Throat:     Mouth: Mucous membranes are moist.     Pharynx: Posterior oropharyngeal erythema present.     Comments: Large post nasal drip  Eyes:     Extraocular Movements: Extraocular movements intact.     Pupils: Pupils are equal, round, and reactive to light.   Cardiovascular:     Rate and Rhythm: Normal rate and regular rhythm.     Heart sounds: Normal heart sounds.  Pulmonary:     Effort: Pulmonary effort is normal.     Breath sounds: Normal breath sounds.  Lymphadenopathy:     Cervical: No cervical adenopathy.  Neurological:     Mental Status: He is alert.     No results found for any visits on 07/25/22.      Assessment & Plan:   Problem List Items Addressed This Visit       Other   Sore throat    Secondary to postnasal drip.  Patient prescribed Zyrtec and Flonase.  Fluticasone precautions reviewed follow-up if no improvement      Relevant Medications   fluticasone (FLONASE) 50 MCG/ACT nasal spray   cetirizine (ZYRTEC) 10 MG tablet   PND (post-nasal drip) - Primary    Zyrtec and Flonase as directed.  Fluticasone precautions reviewed      Relevant Medications   fluticasone (FLONASE) 50 MCG/ACT nasal spray   cetirizine (ZYRTEC) 10 MG tablet    Meds ordered this encounter  Medications   fluticasone (FLONASE) 50 MCG/ACT nasal spray    Sig: Place 2 sprays into  both nostrils daily.    Dispense:  16 g    Refill:  0    Order Specific Question:   Supervising Provider    Answer:   Glori Bickers MARNE A [[id number]]   cetirizine (ZYRTEC) 10 MG tablet    Sig: Take 1 tablet (10 mg total) by mouth daily.    Dispense:  30 tablet    Refill:  0    Order Specific Question:   Supervising Provider    Answer:   TOWER, MARNE A [[id number]]    Return if symptoms worsen or fail to improve.  Romilda Garret, NP

## 2022-07-25 NOTE — Patient Instructions (Signed)
Nice to see you today I have sent in an allergy pill and a nasal spray Follow up if no improvement

## 2022-07-26 NOTE — Telephone Encounter (Signed)
Error

## 2022-07-27 ENCOUNTER — Other Ambulatory Visit: Payer: Self-pay | Admitting: Orthopedic Surgery

## 2022-07-27 ENCOUNTER — Other Ambulatory Visit: Payer: Self-pay | Admitting: Neurology

## 2022-07-30 ENCOUNTER — Telehealth: Payer: Self-pay | Admitting: Neurology

## 2022-07-30 NOTE — Telephone Encounter (Signed)
We have not completed a PA for ajovy. Will complete now.

## 2022-07-30 NOTE — Telephone Encounter (Signed)
Completed PA for ajovy via C[facility]. Sent to [facility]. Key: [id number]. Should have a determination within 3-5 business days.

## 2022-07-30 NOTE — Telephone Encounter (Signed)
Nicholas [name] is calling. Stated prior authorizthion for medication  .Fremanezumab-vfrm (AJOVY) 225 MG/1.5ML SOAJ was rejected. Pt can get  [facility] or  amvoig . Please fax or call [facility]

## 2022-08-12 ENCOUNTER — Inpatient Hospital Stay
Admission: EM | Admit: 2022-08-12 | Discharge: 2022-08-15 | DRG: 177 | Disposition: A | Discharge status: Home Health | Payer: Medicare Other | Attending: Internal Medicine | Admitting: Internal Medicine

## 2022-08-12 ENCOUNTER — Encounter: Payer: Self-pay | Admitting: Internal Medicine

## 2022-08-12 ENCOUNTER — Other Ambulatory Visit: Payer: Self-pay

## 2022-08-12 ENCOUNTER — Emergency Department: Payer: Medicare Other

## 2022-08-12 DIAGNOSIS — Z886 Allergy status to analgesic agent status: Secondary | ICD-10-CM

## 2022-08-12 DIAGNOSIS — M6282 Rhabdomyolysis: Secondary | ICD-10-CM | POA: Diagnosis not present

## 2022-08-12 DIAGNOSIS — Z87898 Personal history of other specified conditions: Secondary | ICD-10-CM

## 2022-08-12 DIAGNOSIS — G9340 Encephalopathy, unspecified: Secondary | ICD-10-CM | POA: Diagnosis not present

## 2022-08-12 DIAGNOSIS — Z79890 Hormone replacement therapy (postmenopausal): Secondary | ICD-10-CM

## 2022-08-12 DIAGNOSIS — G8929 Other chronic pain: Secondary | ICD-10-CM | POA: Diagnosis not present

## 2022-08-12 DIAGNOSIS — M79604 Pain in right leg: Secondary | ICD-10-CM | POA: Diagnosis present

## 2022-08-12 DIAGNOSIS — Z8673 Personal history of transient ischemic attack (TIA), and cerebral infarction without residual deficits: Secondary | ICD-10-CM

## 2022-08-12 DIAGNOSIS — F79 Unspecified intellectual disabilities: Secondary | ICD-10-CM | POA: Diagnosis present

## 2022-08-12 DIAGNOSIS — Z96652 Presence of left artificial knee joint: Secondary | ICD-10-CM | POA: Diagnosis present

## 2022-08-12 DIAGNOSIS — Z043 Encounter for examination and observation following other accident: Secondary | ICD-10-CM | POA: Diagnosis not present

## 2022-08-12 DIAGNOSIS — R339 Retention of urine, unspecified: Secondary | ICD-10-CM | POA: Diagnosis present

## 2022-08-12 DIAGNOSIS — J1282 Pneumonia due to coronavirus disease 2019: Secondary | ICD-10-CM | POA: Diagnosis not present

## 2022-08-12 DIAGNOSIS — Z743 Need for continuous supervision: Secondary | ICD-10-CM | POA: Diagnosis not present

## 2022-08-12 DIAGNOSIS — Z87892 Personal history of anaphylaxis: Secondary | ICD-10-CM

## 2022-08-12 DIAGNOSIS — M545 Low back pain, unspecified: Secondary | ICD-10-CM | POA: Diagnosis present

## 2022-08-12 DIAGNOSIS — Z823 Family history of stroke: Secondary | ICD-10-CM

## 2022-08-12 DIAGNOSIS — N1831 Chronic kidney disease, stage 3a: Secondary | ICD-10-CM | POA: Diagnosis not present

## 2022-08-12 DIAGNOSIS — U071 COVID-19: Secondary | ICD-10-CM

## 2022-08-12 DIAGNOSIS — I5032 Chronic diastolic (congestive) heart failure: Secondary | ICD-10-CM | POA: Insufficient documentation

## 2022-08-12 DIAGNOSIS — G9341 Metabolic encephalopathy: Secondary | ICD-10-CM | POA: Diagnosis not present

## 2022-08-12 DIAGNOSIS — Z7951 Long term (current) use of inhaled steroids: Secondary | ICD-10-CM

## 2022-08-12 DIAGNOSIS — R531 Weakness: Principal | ICD-10-CM

## 2022-08-12 DIAGNOSIS — M79605 Pain in left leg: Secondary | ICD-10-CM | POA: Diagnosis present

## 2022-08-12 DIAGNOSIS — Z8249 Family history of ischemic heart disease and other diseases of the circulatory system: Secondary | ICD-10-CM

## 2022-08-12 DIAGNOSIS — N179 Acute kidney failure, unspecified: Secondary | ICD-10-CM

## 2022-08-12 DIAGNOSIS — Z833 Family history of diabetes mellitus: Secondary | ICD-10-CM

## 2022-08-12 DIAGNOSIS — E861 Hypovolemia: Secondary | ICD-10-CM | POA: Diagnosis not present

## 2022-08-12 DIAGNOSIS — R338 Other retention of urine: Secondary | ICD-10-CM

## 2022-08-12 DIAGNOSIS — Z96611 Presence of right artificial shoulder joint: Secondary | ICD-10-CM | POA: Diagnosis present

## 2022-08-12 DIAGNOSIS — E860 Dehydration: Secondary | ICD-10-CM | POA: Diagnosis present

## 2022-08-12 DIAGNOSIS — Z91018 Allergy to other foods: Secondary | ICD-10-CM

## 2022-08-12 DIAGNOSIS — J9601 Acute respiratory failure with hypoxia: Secondary | ICD-10-CM | POA: Diagnosis present

## 2022-08-12 DIAGNOSIS — Z885 Allergy status to narcotic agent status: Secondary | ICD-10-CM

## 2022-08-12 DIAGNOSIS — Z91030 Bee allergy status: Secondary | ICD-10-CM

## 2022-08-12 DIAGNOSIS — Z91040 Latex allergy status: Secondary | ICD-10-CM

## 2022-08-12 DIAGNOSIS — K760 Fatty (change of) liver, not elsewhere classified: Secondary | ICD-10-CM | POA: Diagnosis present

## 2022-08-12 DIAGNOSIS — R0902 Hypoxemia: Secondary | ICD-10-CM

## 2022-08-12 DIAGNOSIS — R569 Unspecified convulsions: Secondary | ICD-10-CM | POA: Diagnosis present

## 2022-08-12 DIAGNOSIS — S0990XA Unspecified injury of head, initial encounter: Secondary | ICD-10-CM | POA: Diagnosis not present

## 2022-08-12 DIAGNOSIS — Z79899 Other long term (current) drug therapy: Secondary | ICD-10-CM

## 2022-08-12 DIAGNOSIS — E039 Hypothyroidism, unspecified: Secondary | ICD-10-CM | POA: Diagnosis not present

## 2022-08-12 DIAGNOSIS — Z79620 Long term (current) use of immunosuppressive biologic: Secondary | ICD-10-CM

## 2022-08-12 DIAGNOSIS — I509 Heart failure, unspecified: Secondary | ICD-10-CM

## 2022-08-12 DIAGNOSIS — Z803 Family history of malignant neoplasm of breast: Secondary | ICD-10-CM

## 2022-08-12 DIAGNOSIS — M47812 Spondylosis without myelopathy or radiculopathy, cervical region: Secondary | ICD-10-CM | POA: Diagnosis not present

## 2022-08-12 HISTORY — DX: COVID-19: U07.1

## 2022-08-12 LAB — CBC WITH DIFFERENTIAL/PLATELET
Abs Immature Granulocytes: 0.04 10*3/uL (ref 0.00–0.07)
Basophils Absolute: 0 10*3/uL (ref 0.0–0.1)
Basophils Relative: 0 %
Eosinophils Absolute: 0 10*3/uL (ref 0.0–0.5)
Eosinophils Relative: 0 %
HCT: 46.8 % (ref 39.0–52.0)
Hemoglobin: 15.6 g/dL (ref 13.0–17.0)
Immature Granulocytes: 0 %
Lymphocytes Relative: 6 %
Lymphs Abs: 0.6 10*3/uL — ABNORMAL LOW (ref 0.7–4.0)
MCH: 32.1 pg (ref 26.0–34.0)
MCHC: 33.3 g/dL (ref 30.0–36.0)
MCV: 96.3 fL (ref 80.0–100.0)
Monocytes Absolute: 2.5 10*3/uL — ABNORMAL HIGH (ref 0.1–1.0)
Monocytes Relative: 24 %
Neutro Abs: 7 10*3/uL (ref 1.7–7.7)
Neutrophils Relative %: 70 %
Platelets: 152 10*3/uL (ref 150–400)
RBC: 4.86 MIL/uL (ref 4.22–5.81)
RDW: 13.2 % (ref 11.5–15.5)
WBC: 10.1 10*3/uL (ref 4.0–10.5)
nRBC: 0 % (ref 0.0–0.2)

## 2022-08-12 LAB — COMPREHENSIVE METABOLIC PANEL
ALT: 31 U/L (ref 0–44)
AST: 90 U/L — ABNORMAL HIGH (ref 15–41)
Albumin: 4 g/dL (ref 3.5–5.0)
Alkaline Phosphatase: 73 U/L (ref 38–126)
Anion gap: 12 (ref 5–15)
BUN: 26 mg/dL — ABNORMAL HIGH (ref 8–23)
CO2: 20 mmol/L — ABNORMAL LOW (ref 22–32)
Calcium: 8.9 mg/dL (ref 8.9–10.3)
Chloride: 103 mmol/L (ref 98–111)
Creatinine, Ser: 2.01 mg/dL — ABNORMAL HIGH (ref 0.61–1.24)
GFR, Estimated: 35 mL/min — ABNORMAL LOW (ref >60)
Glucose, Bld: 144 mg/dL — ABNORMAL HIGH (ref 70–99)
Potassium: 4.5 mmol/L (ref 3.5–5.1)
Sodium: 135 mmol/L (ref 135–145)
Total Bilirubin: 1.1 mg/dL (ref 0.3–1.2)
Total Protein: 7.7 g/dL (ref 6.5–8.1)

## 2022-08-12 LAB — BLOOD GAS, VENOUS
Acid-Base Excess: 2.5 mmol/L — ABNORMAL HIGH (ref 0.0–2.0)
Bicarbonate: 29.3 mmol/L — ABNORMAL HIGH (ref 20.0–28.0)
O2 Saturation: 49.2 %
Patient temperature: 37
pCO2, Ven: 53 mmHg (ref 44–60)
pH, Ven: 7.35 (ref 7.25–7.43)
pO2, Ven: 35 mmHg (ref 32–45)

## 2022-08-12 LAB — URINALYSIS, ROUTINE W REFLEX MICROSCOPIC
Bacteria, UA: NONE SEEN
Bilirubin Urine: NEGATIVE
Glucose, UA: NEGATIVE mg/dL
Ketones, ur: 5 mg/dL — AB
Leukocytes,Ua: NEGATIVE
Nitrite: NEGATIVE
Protein, ur: 30 mg/dL — AB
Specific Gravity, Urine: 1.027 (ref 1.005–1.030)
pH: 5 (ref 5.0–8.0)

## 2022-08-12 LAB — RESP PANEL BY RT-PCR (FLU A&B, COVID) ARPGX2
Influenza A by PCR: NEGATIVE
Influenza B by PCR: NEGATIVE
SARS Coronavirus 2 by RT PCR: POSITIVE — AB

## 2022-08-12 LAB — T4, FREE: Free T4: 0.96 ng/dL (ref 0.61–1.12)

## 2022-08-12 LAB — TROPONIN I (HIGH SENSITIVITY)
Troponin I (High Sensitivity): 8 ng/L (ref <18)
Troponin I (High Sensitivity): 8 ng/L (ref <18)

## 2022-08-12 LAB — CK: Total CK: 4177 U/L — ABNORMAL HIGH (ref 49–397)

## 2022-08-12 LAB — BRAIN NATRIURETIC PEPTIDE: B Natriuretic Peptide: 124.1 pg/mL — ABNORMAL HIGH (ref 0.0–100.0)

## 2022-08-12 LAB — C-REACTIVE PROTEIN: CRP: 4.7 mg/dL — ABNORMAL HIGH (ref <1.0)

## 2022-08-12 LAB — TSH: TSH: 1.5 u[IU]/mL (ref 0.350–4.500)

## 2022-08-12 MED ORDER — ADULT MULTIVITAMIN W/MINERALS CH
1.0000 | ORAL_TABLET | Freq: Every day | ORAL | Status: DC
  Administered 2022-08-12 – 2022-08-14 (×3): 1 via ORAL
  Filled 2022-08-12 (×3): qty 1

## 2022-08-12 MED ORDER — ONDANSETRON HCL 4 MG PO TABS: 4.0000 mg | ORAL_TABLET | Freq: Four times a day (QID) | ORAL | Status: DC | PRN

## 2022-08-12 MED ORDER — ENOXAPARIN SODIUM 60 MG/0.6ML IJ SOSY
0.5000 mg/kg | PREFILLED_SYRINGE | INTRAMUSCULAR | Status: DC
  Administered 2022-08-12 – 2022-08-14 (×3): 52.5 mg via SUBCUTANEOUS
  Filled 2022-08-12 (×3): qty 0.6

## 2022-08-12 MED ORDER — DIVALPROEX SODIUM ER 500 MG PO TB24
500.0000 mg | ORAL_TABLET | Freq: Every day | ORAL | Status: DC
  Administered 2022-08-12 – 2022-08-14 (×3): 500 mg via ORAL
  Filled 2022-08-12 (×4): qty 1

## 2022-08-12 MED ORDER — SODIUM CHLORIDE 0.9% FLUSH
3.0000 mL | Freq: Two times a day (BID) | INTRAVENOUS | Status: DC
  Administered 2022-08-12 – 2022-08-15 (×5): 3 mL via INTRAVENOUS

## 2022-08-12 MED ORDER — FOLIC ACID 1 MG PO TABS
1.0000 mg | ORAL_TABLET | Freq: Every day | ORAL | Status: DC
  Administered 2022-08-12 – 2022-08-15 (×4): 1 mg via ORAL
  Filled 2022-08-12 (×4): qty 1

## 2022-08-12 MED ORDER — LEVOTHYROXINE SODIUM 112 MCG PO TABS
112.0000 ug | ORAL_TABLET | Freq: Every day | ORAL | Status: DC
  Administered 2022-08-13 – 2022-08-15 (×3): 112 ug via ORAL
  Filled 2022-08-12 (×3): qty 1

## 2022-08-12 MED ORDER — ORAL CARE MOUTH RINSE: 15.0000 mL | OROMUCOSAL | Status: DC | PRN

## 2022-08-12 MED ORDER — SODIUM CHLORIDE 0.9 % IV SOLN
1000.0000 mL | INTRAVENOUS | Status: AC
  Administered 2022-08-12: 1000 mL via INTRAVENOUS

## 2022-08-12 MED ORDER — SODIUM CHLORIDE 0.9 % IV SOLN
1000.0000 mL | INTRAVENOUS | Status: DC
  Administered 2022-08-12: 1000 mL via INTRAVENOUS

## 2022-08-12 MED ORDER — ONDANSETRON HCL 4 MG/2ML IJ SOLN: 4.0000 mg | Freq: Four times a day (QID) | INTRAMUSCULAR | Status: DC | PRN

## 2022-08-12 MED ORDER — NIRMATRELVIR/RITONAVIR (PAXLOVID) TABLET (RENAL DOSING)
2.0000 | ORAL_TABLET | Freq: Two times a day (BID) | ORAL | Status: DC
  Administered 2022-08-12 – 2022-08-15 (×7): 2 via ORAL
  Filled 2022-08-12: qty 20

## 2022-08-12 MED ORDER — THIAMINE MONONITRATE 100 MG PO TABS
100.0000 mg | ORAL_TABLET | Freq: Every day | ORAL | Status: DC
  Administered 2022-08-12 – 2022-08-15 (×4): 100 mg via ORAL
  Filled 2022-08-12 (×4): qty 1

## 2022-08-12 MED ORDER — ACETAMINOPHEN 325 MG PO TABS: 650.0000 mg | ORAL_TABLET | Freq: Four times a day (QID) | ORAL | Status: DC | PRN

## 2022-08-12 MED ORDER — DEXAMETHASONE SODIUM PHOSPHATE 10 MG/ML IJ SOLN
10.0000 mg | Freq: Once | INTRAMUSCULAR | Status: AC
  Administered 2022-08-12: 10 mg via INTRAVENOUS
  Filled 2022-08-12: qty 1

## 2022-08-12 MED ORDER — SODIUM CHLORIDE 0.9 % IV BOLUS (SEPSIS)
1000.0000 mL | Freq: Once | INTRAVENOUS | Status: AC
  Administered 2022-08-12: 1000 mL via INTRAVENOUS

## 2022-08-12 MED ORDER — SODIUM CHLORIDE 0.9 % IV BOLUS
1000.0000 mL | Freq: Once | INTRAVENOUS | Status: AC
  Administered 2022-08-12: 1000 mL via INTRAVENOUS

## 2022-08-12 MED ORDER — DEXAMETHASONE 4 MG PO TABS
6.0000 mg | ORAL_TABLET | Freq: Every day | ORAL | Status: DC
  Administered 2022-08-13 – 2022-08-15 (×3): 6 mg via ORAL
  Filled 2022-08-12 (×3): qty 2

## 2022-08-12 MED ORDER — POLYETHYLENE GLYCOL 3350 17 G PO PACK: 17.0000 g | PACK | Freq: Every day | ORAL | Status: DC | PRN

## 2022-08-12 MED ORDER — FERROUS SULFATE 325 (65 FE) MG PO TABS
325.0000 mg | ORAL_TABLET | Freq: Every day | ORAL | Status: DC
  Administered 2022-08-13 – 2022-08-15 (×3): 325 mg via ORAL
  Filled 2022-08-12 (×3): qty 1

## 2022-08-12 MED ORDER — METOPROLOL SUCCINATE ER 50 MG PO TB24
100.0000 mg | ORAL_TABLET | Freq: Every day | ORAL | Status: DC
  Administered 2022-08-13 – 2022-08-14 (×2): 100 mg via ORAL
  Filled 2022-08-12 (×2): qty 2

## 2022-08-12 NOTE — Assessment & Plan Note (Addendum)
Currently treated by PCP with gabapentin, amitriptyline and tizanidine.  --resume amitriptyline and gabapentin
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
## 2022-08-12 NOTE — ED Notes (Signed)
Pt taken to ED 17, O2 at 86% RA. Pt placed on O2 at 2L [place] with O2 at 95 % now. Pt placed on monitor. RN informed of pt in room at this time.

## 2022-08-12 NOTE — Assessment & Plan Note (Addendum)
--  saturating 86% on room air on arrival with chest x-ray demonstrating bibasilar opacities. --Started on Decadron and Paxlovid --on room air today Plan: --cont decadron and Paxlovid

## 2022-08-12 NOTE — Progress Notes (Signed)
PHARMACIST - PHYSICIAN COMMUNICATION  CONCERNING:  Enoxaparin (Lovenox) for DVT Prophylaxis    RECOMMENDATION: Patient was prescribed enoxaparin '40mg'$  q24 hours for VTE prophylaxis.   Filed Weights   08/12/22 0614  Weight: 106.6 kg (235 lb)    Body mass index is 35.73 kg/m.  Estimated Creatinine Clearance: 39.9 mL/min (A) (by C-G formula based on SCr of 2.01 mg/dL (H)).  Based on [name] patient is candidate for enoxaparin 0.'5mg'$ /kg TBW SQ every 24 hours based on BMI being >30.  DESCRIPTION: Pharmacy has adjusted enoxaparin dose per [name] policy.  Patient is now receiving enoxaparin 52.5 mg every 24 hours    [name] 08/12/2022 1:19 PM
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
## 2022-08-12 NOTE — ED Notes (Signed)
Pt pump programmed manually for 2nd 1000 mL infusion. Currently running.

## 2022-08-12 NOTE — Assessment & Plan Note (Signed)
-  cont home levothyroxine

## 2022-08-12 NOTE — ED Provider Notes (Signed)
[facility] Provider Note    Event Date/Time   First MD Initiated Contact with Patient 08/12/22 1022     (approximate)   History   Weakness   HPI  Nicholas [name] is a 71 y.o. male who presents to the emergency department with altered mental status.  According to triage report patient was picked up with EMS and was found down for unknown time.  Patient is unable to provide any history.  Attempted to call multiple family members listed in the chart however unable to get a hold of anyone.      Physical Exam   Triage Vital Signs: ED Triage Vitals  Enc Vitals Group     BP 08/12/22 0612 92/67     Pulse Rate 08/12/22 0612 89     Resp 08/12/22 0612 16     Temp 08/12/22 0612 98.1 F (36.7 C)     Temp Source 08/12/22 0612 Oral     SpO2 08/12/22 0612 94 %     Weight 08/12/22 0614 235 lb (106.6 kg)     Height 08/12/22 0614 '5\' 8"'$  (1.727 m)     Head Circumference --      Peak Flow --      Pain Score 08/12/22 0613 8     Pain Loc --      Pain Edu? --      Excl. in [place]? --     Most recent vital signs: Vitals:   08/12/22 1114 08/12/22 1519  BP:  118/60  Pulse:  61  Resp:  14  Temp: 98.3 F (36.8 C) 97.7 F (36.5 C)  SpO2:  95%    Physical Exam Constitutional:      Appearance: He is well-developed.     Comments: Somnolent, opens eyes to voice and noxious stimulus.  Not answering any questions.  Following simple commands.  HENT:     Head: Atraumatic.  Cardiovascular:     Rate and Rhythm: Regular rhythm.  Pulmonary:     Effort: Respiratory distress present.     Comments: Coarse breath sounds Musculoskeletal:        General: Normal range of motion.     Cervical back: Normal range of motion.  Skin:    General: Skin is warm.  Neurological:     Mental Status: He is disoriented.          IMPRESSION / MDM / ASSESSMENT AND PLAN / ED COURSE  I reviewed the triage vital signs and the nursing notes.  71 year old presents emergency  department with altered mental status.  On arrival afebrile.  Uncertain of who called EMS.  Patient is unable to provide any history and attempted to call multiple family members without any answers.  Differential diagnosis including dehydration, intracranial hemorrhage, pneumonia, electrolyte abnormality.  Patient found to be in rhabdomyolysis.  COVID testing came back positive.  Given 2 L of IV fluids and started on maintenance IV fluids.  Does have an acute kidney injury.    EKG  EKG showed normal sinus rhythm.  Normal intervals.  No significant ST elevation or depression.  Underlying artifact.  No significant ST elevation or depression.  No tachycardic or bradycardic dysrhythmias on cardiac telemetry  RADIOLOGY I independently reviewed imaging, my interpretation of imaging: CT head with no acute intracranial hemorrhage or infarction.  Chest x-ray without focal findings consistent with pneumonia      ED Results / Procedures / Treatments   Labs (all labs ordered are listed, but  only abnormal results are displayed) Labs interpreted as -    Labs Reviewed  RESP PANEL BY RT-PCR (FLU A&B, COVID) ARPGX2 - Abnormal; Notable for the following components:      Result Value   SARS Coronavirus 2 by RT PCR POSITIVE (*)    All other components within normal limits  CBC WITH DIFFERENTIAL/PLATELET - Abnormal; Notable for the following components:   Lymphs Abs 0.6 (*)    Monocytes Absolute 2.5 (*)    All other components within normal limits  COMPREHENSIVE METABOLIC PANEL - Abnormal; Notable for the following components:   CO2 20 (*)    Glucose, Bld 144 (*)    BUN 26 (*)    Creatinine, Ser 2.01 (*)    AST 90 (*)    GFR, Estimated 35 (*)    All other components within normal limits  BRAIN NATRIURETIC PEPTIDE - Abnormal; Notable for the following components:   B Natriuretic Peptide 124.1 (*)    All other components within normal limits  CK - Abnormal; Notable for the following  components:   Total CK 4,177 (*)    All other components within normal limits  BLOOD GAS, VENOUS - Abnormal; Notable for the following components:   Bicarbonate 29.3 (*)    Acid-Base Excess 2.5 (*)    All other components within normal limits  TSH  T4, FREE  URINALYSIS, ROUTINE W REFLEX MICROSCOPIC  C-REACTIVE PROTEIN  TROPONIN I (HIGH SENSITIVITY)  TROPONIN I (HIGH SENSITIVITY)    Patient given IV fluids for rhabdomyolysis.  Patient with significant altered mental status but no signs of intracranial hemorrhage or infarction.  Have a low suspicion for acute meningitis.  VBG without significant hypercarbia.  Consulted the hospitalist who evaluated the emergency department.  Patient will be admitted for rhabdomyolysis, COVID and acute hypoxic respiratory failure.   PROCEDURES:  Critical Care performed: No  Procedures  Patient's presentation is most consistent with acute presentation with potential threat to life or bodily function.   MEDICATIONS ORDERED IN ED: Medications  enoxaparin (LOVENOX) injection 52.5 mg (has no administration in time range)  sodium chloride flush (NS) 0.9 % injection 3 mL (3 mLs Intravenous Given 08/12/22 1531)  polyethylene glycol (MIRALAX / GLYCOLAX) packet 17 g (has no administration in time range)  acetaminophen (TYLENOL) tablet 650 mg (has no administration in time range)  multivitamin with minerals tablet 1 tablet (1 tablet Oral Given 21/30/86 5784)  folic acid (FOLVITE) tablet 1 mg (1 mg Oral Given 08/12/22 1525)  thiamine (VITAMIN B1) tablet 100 mg (100 mg Oral Given 08/12/22 1525)  ondansetron (ZOFRAN) tablet 4 mg (has no administration in time range)    Or  ondansetron (ZOFRAN) injection 4 mg (has no administration in time range)  nirmatrelvir/ritonavir EUA (renal dosing) (PAXLOVID) 2 tablet (2 tablets Oral Given 08/12/22 1526)  dexamethasone (DECADRON) tablet 6 mg (has no administration in time range)  sodium chloride 0.9 % bolus 1,000 mL (0  mLs Intravenous Stopped 08/12/22 1231)    Followed by  0.9 %  sodium chloride infusion (1,000 mLs Intravenous New Bag/Given 08/12/22 1531)  divalproex (DEPAKOTE ER) 24 hr tablet 500 mg (has no administration in time range)  ferrous sulfate tablet 325 mg (has no administration in time range)  levothyroxine (SYNTHROID) tablet 112 mcg (has no administration in time range)  metoprolol succinate (TOPROL-XL) 24 hr tablet 100 mg (has no administration in time range)  sodium chloride 0.9 % bolus 1,000 mL (0 mLs Intravenous Stopped 08/12/22 1227)  dexamethasone (  DECADRON) injection 10 mg (10 mg Intravenous Given 08/12/22 1247)    FINAL CLINICAL IMPRESSION(S) / ED DIAGNOSES   Final diagnoses:  Weakness  AKI (acute kidney injury) ([place])  Dehydration  Non-traumatic rhabdomyolysis  Hypoxic  COVID     Rx / DC Orders   ED Discharge Orders     None        Note:  This document was prepared using Dragon voice recognition software and may include unintentional dictation errors.   Nathaniel Man, MD 08/12/22 1609

## 2022-08-12 NOTE — Assessment & Plan Note (Addendum)
Patient appears hypovolemic at this time. - cont home metoprolol - Hold home Lasix in the setting of AKI

## 2022-08-12 NOTE — ED Triage Notes (Signed)
Brought in via EMS with c/o increased weakness.  EMS reports that pt was found sitting on rolling walker for 4-6 hours d/t not being able to maneuver self.  Pt reports that he had a fall a week ago and since has been declining.

## 2022-08-12 NOTE — ED Notes (Signed)
Advised nurse that patient has ready bed 
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
## 2022-08-12 NOTE — Assessment & Plan Note (Addendum)
Patient presenting with waxing and waning altered mental status.  Initially for ED provider, he was minimally responsive to questions.  Per son, patient has a history of gradually worsening memory deficits concerning for developing dementia.  In addition, patient has a history of intellectual disability dating back to childhood.  Overall, I suspect this is delirium in the setting of COVID-19 infection.  --mental status back to baseline today

## 2022-08-12 NOTE — Plan of Care (Signed)
Patient remains on supplemental O2 at 2 L/min. Patient remains on NS @ 75 mL/hr. Patient remains on telemetry monitoring. Intermittently forgetful but otherwise neuro intact.   Problem: Education: Goal: Knowledge of risk factors and measures for prevention of condition will improve Outcome: Progressing   Problem: Coping: Goal: Psychosocial and spiritual needs will be supported Outcome: Progressing   Problem: Respiratory: Goal: Will maintain a patent airway Outcome: Progressing Goal: Complications related to the disease process, condition or treatment will be avoided or minimized Outcome: Progressing   Problem: Education: Goal: Knowledge of General Education information will improve Description: Including pain rating scale, medication(s)/side effects and non-pharmacologic comfort measures Outcome: Progressing   Problem: Safety: Goal: Ability to remain free from injury will improve Outcome: Progressing

## 2022-08-13 DIAGNOSIS — J1282 Pneumonia due to coronavirus disease 2019: Secondary | ICD-10-CM | POA: Diagnosis not present

## 2022-08-13 DIAGNOSIS — R338 Other retention of urine: Secondary | ICD-10-CM

## 2022-08-13 DIAGNOSIS — U071 COVID-19: Secondary | ICD-10-CM | POA: Diagnosis not present

## 2022-08-13 DIAGNOSIS — Z87898 Personal history of other specified conditions: Secondary | ICD-10-CM

## 2022-08-13 LAB — COMPREHENSIVE METABOLIC PANEL
ALT: 45 U/L — ABNORMAL HIGH (ref 0–44)
AST: 113 U/L — ABNORMAL HIGH (ref 15–41)
Albumin: 3.3 g/dL — ABNORMAL LOW (ref 3.5–5.0)
Alkaline Phosphatase: 62 U/L (ref 38–126)
Anion gap: 6 (ref 5–15)
BUN: 26 mg/dL — ABNORMAL HIGH (ref 8–23)
CO2: 27 mmol/L (ref 22–32)
Calcium: 8.6 mg/dL — ABNORMAL LOW (ref 8.9–10.3)
Chloride: 106 mmol/L (ref 98–111)
Creatinine, Ser: 1.33 mg/dL — ABNORMAL HIGH (ref 0.61–1.24)
GFR, Estimated: 57 mL/min — ABNORMAL LOW (ref >60)
Glucose, Bld: 138 mg/dL — ABNORMAL HIGH (ref 70–99)
Potassium: 4.7 mmol/L (ref 3.5–5.1)
Sodium: 139 mmol/L (ref 135–145)
Total Bilirubin: 0.8 mg/dL (ref 0.3–1.2)
Total Protein: 6.7 g/dL (ref 6.5–8.1)

## 2022-08-13 LAB — CBC WITH DIFFERENTIAL/PLATELET
Abs Immature Granulocytes: 0.02 10*3/uL (ref 0.00–0.07)
Basophils Absolute: 0 10*3/uL (ref 0.0–0.1)
Basophils Relative: 0 %
Eosinophils Absolute: 0 10*3/uL (ref 0.0–0.5)
Eosinophils Relative: 0 %
HCT: 42.9 % (ref 39.0–52.0)
Hemoglobin: 14.1 g/dL (ref 13.0–17.0)
Immature Granulocytes: 1 %
Lymphocytes Relative: 13 %
Lymphs Abs: 0.6 10*3/uL — ABNORMAL LOW (ref 0.7–4.0)
MCH: 31.7 pg (ref 26.0–34.0)
MCHC: 32.9 g/dL (ref 30.0–36.0)
MCV: 96.4 fL (ref 80.0–100.0)
Monocytes Absolute: 0.7 10*3/uL (ref 0.1–1.0)
Monocytes Relative: 18 %
Neutro Abs: 2.9 10*3/uL (ref 1.7–7.7)
Neutrophils Relative %: 68 %
Platelets: 148 10*3/uL — ABNORMAL LOW (ref 150–400)
RBC: 4.45 MIL/uL (ref 4.22–5.81)
RDW: 13.2 % (ref 11.5–15.5)
WBC: 4.2 10*3/uL (ref 4.0–10.5)
nRBC: 0 % (ref 0.0–0.2)

## 2022-08-13 LAB — C-REACTIVE PROTEIN: CRP: 3.8 mg/dL — ABNORMAL HIGH (ref <1.0)

## 2022-08-13 MED ORDER — SODIUM CHLORIDE 0.9 % IV SOLN: INTRAVENOUS | Status: AC

## 2022-08-13 NOTE — Assessment & Plan Note (Signed)
--  cont home Depakote

## 2022-08-13 NOTE — Evaluation (Addendum)
Physical Therapy Evaluation Patient Details Name: Nicholas [name] MRN: [id number] DOB: 14-Jan-1951 Today's Date: 08/13/2022  History of Present Illness  JMARCUS BLOEMKER is a 71 y.o. male with medical history significant of , hypothyroidism, seizures on Depakote, CVA (2010), fatty liver, who presents to the ED with complaints of back and leg pain  Clinical Impression  Pt is a pleasant 71 year old male who was admitted for pneumonia secondary to covid. Pt performs transfers with supervision and ambulation with cga and RW. All mobility performed on RA with sats WNL although does complain of SOB symptoms. Due to previous surgery, keeps R knee in extension at all times, appears contracted. Pt demonstrates deficits with strength/mobility/endurance/cognition. Would benefit from skilled PT to address above deficits and promote optimal return to PLOF. Recommend transition to HHPT upon discharge from acute hospitalization.      Recommendations for follow up therapy are one component of a multi-disciplinary discharge planning process, led by the attending physician.  Recommendations may be updated based on patient status, additional functional criteria and insurance authorization.  Follow Up Recommendations Home health PT      Assistance Recommended at Discharge PRN  Patient can return home with the following  A little help with walking and/or transfers;A little help with bathing/dressing/bathroom;Assist for transportation    Equipment Recommendations BSC/3in1  Recommendations for Other Services       Functional Status Assessment Patient has had a recent decline in their functional status and demonstrates the ability to make significant improvements in function in a reasonable and predictable amount of time.     Precautions / Restrictions Precautions Precautions: Fall Restrictions Weight Bearing Restrictions: No      Mobility  Bed Mobility               General bed mobility comments:  in recliner pre/post session    Transfers Overall transfer level: Needs assistance Equipment used: Rolling walker (2 wheels) Transfers: Sit to/from Stand Sit to Stand: Supervision           General transfer comment: safe technique with upright posture. RW used. All mobility performed on RA    Ambulation/Gait Ambulation/Gait assistance: Min guard Gait Distance (Feet): 200 Feet Assistive device: Rolling walker (2 wheels) Gait Pattern/deviations: Step-through pattern       General Gait Details: O2 sats at 96% on RA with exertion, however increased SOB symptoms. Rates mild SOB symptoms. Safe technique with RW, however at end of session tends to rush and keeps walker too far infront of body  Stairs            Wheelchair Mobility    Modified Rankin (Stroke Patients Only)       Balance Overall balance assessment: Needs assistance Sitting-balance support: No upper extremity supported, Feet supported Sitting balance-Leahy Scale: Normal     Standing balance support: Bilateral upper extremity supported Standing balance-Leahy Scale: Good                               Pertinent Vitals/Pain Pain Assessment Pain Assessment: No/denies pain    Home Living Family/patient expects to be discharged to:: Private residence Living Arrangements: Alone Available Help at Discharge: Friend(s);Available PRN/intermittently Type of Home: Independent living facility Home Access: Ramped entrance       Home Layout: One level Home Equipment: Rollator (4 wheels);Cane - single point;Electric scooter Additional Comments: [facility] [employer] account [facility] for senior living.    Prior Function Prior Level of  Function : Independent/Modified Independent;Driving             Mobility Comments: can have help with groceries, cleaning by "the girls"- patient is unable to elaborate on the help he has but said some are paid help ADLs Comments: showers indep     Hand Dominance    Dominant Hand: Right    Extremity/Trunk Assessment   Upper Extremity Assessment Upper Extremity Assessment: Overall WFL for tasks assessed    Lower Extremity Assessment Lower Extremity Assessment: Generalized weakness (R LE kept in full extension, unable to demonstrate flexion ROM)       Communication   Communication: HOH  Cognition Arousal/Alertness: Awake/alert Behavior During Therapy: WFL for tasks assessed/performed Overall Cognitive Status: Within Functional Limits for tasks assessed                                 General Comments: memory deficits at baseline. Needs reminders to call 911 in an emergency        General Comments General comments (skin integrity, edema, etc.): SpO2 96% on RA, 90% with mobility    Exercises     Assessment/Plan    PT Assessment Patient needs continued PT services  PT Problem List Decreased strength;Decreased activity tolerance;Decreased balance;Decreased mobility;Cardiopulmonary status limiting activity       PT Treatment Interventions Gait training;DME instruction;Therapeutic exercise;Balance training    PT Goals (Current goals can be found in the Care Plan section)  Acute Rehab PT Goals Patient Stated Goal: to go home PT Goal Formulation: With patient Time For Goal Achievement: 08/27/22 Potential to Achieve Goals: Good    Frequency Min 2X/week     Co-evaluation               AM-PAC PT "6 Clicks" Mobility  Outcome Measure Help needed turning from your back to your side while in a flat bed without using bedrails?: None Help needed moving from lying on your back to sitting on the side of a flat bed without using bedrails?: None Help needed moving to and from a bed to a chair (including a wheelchair)?: A Little Help needed standing up from a chair using your arms (e.g., wheelchair or bedside chair)?: A Little Help needed to walk in hospital room?: A Little Help needed climbing 3-5 steps with a railing?  : A Little 6 Click Score: 20    End of Session Equipment Utilized During Treatment: Gait belt Activity Tolerance: Patient tolerated treatment well Patient left: in chair;with chair alarm set Nurse Communication: Mobility status PT Visit Diagnosis: Unsteadiness on feet (R26.81);Muscle weakness (generalized) (M62.81);Difficulty in walking, not elsewhere classified (R26.2)    Time: 3295-1884 PT Time Calculation (min) (ACUTE ONLY): 41 min   Charges:   PT Evaluation $PT Eval Low Complexity: 1 Low PT Treatments $Gait Training: 23-37 mins        Elizabeth Palau, PT, DPT, GCS [phone]   Aleighna Wojtas 08/13/2022, 3:45 PM

## 2022-08-13 NOTE — Evaluation (Signed)
Occupational Therapy Evaluation Patient Details Name: Nicholas [name] MRN: [id number] DOB: [birth year] Today's Date: 08/13/2022   History of Present Illness Nicholas [name] is a 71 y.o. male with medical history significant of , hypothyroidism, seizures on Depakote, CVA (2010), fatty liver, who presents to the ED with complaints of back and leg pain   Clinical Impression   Nicholas [name] was seen for OT evaluation this date. Prior to hospital admission, pt was MOD I with AD. Pt lives alone at [place]. Pt presents to acute OT demonstrating impaired ADL performance and functional mobility 2/2 decreased activity tolerance. Pt requires MIN cues to initiate ADLs/mobility (hard of hearing vs cognition). Pt currently requires SUPERVISION + RW for standing grooming tasks and ADL t/f, tolerates ~200 ft. SpO2 90% on RA during mobility, 96% at rest. Educated on ECS, all education complete, will sign off. Upon hospital discharge, recommend no OT follow up.    Recommendations for follow up therapy are one component of a multi-disciplinary discharge planning process, led by the attending physician.  Recommendations may be updated based on patient status, additional functional criteria and insurance authorization.   Follow Up Recommendations  No OT follow up    Assistance Recommended at Discharge Set up Supervision/Assistance  Patient can return home with the following Help with stairs or ramp for entrance    Functional Status Assessment  Patient has had a recent decline in their functional status and demonstrates the ability to make significant improvements in function in a reasonable and predictable amount of time.  Equipment Recommendations  None recommended by OT    Recommendations for Other Services       Precautions / Restrictions Precautions Precautions: Fall Restrictions Weight Bearing Restrictions: No      Mobility Bed Mobility Overal bed mobility: Modified Independent             General  bed mobility comments: HOB elevated    Transfers Overall transfer level: Needs assistance Equipment used: Rolling walker (2 wheels) Transfers: Sit to/from Stand Sit to Stand: Supervision                  Balance Overall balance assessment: Needs assistance Sitting-balance support: No upper extremity supported, Feet supported Sitting balance-Leahy Scale: Normal     Standing balance support: No upper extremity supported, During functional activity Standing balance-Leahy Scale: Fair                             ADL either performed or assessed with clinical judgement   ADL Overall ADL's : Needs assistance/impaired                                       General ADL Comments: MOD I for LB access seated EOB. SUPERVISION + RW for ADL t/f, tolerates ~200 ft and standing grooming tasks.      Pertinent Vitals/Pain Pain Assessment Pain Assessment: No/denies pain     Hand Dominance Right   Extremity/Trunk Assessment Upper Extremity Assessment Upper Extremity Assessment: Overall WFL for tasks assessed   Lower Extremity Assessment Lower Extremity Assessment: Generalized weakness       Communication Communication Communication: HOH   Cognition Arousal/Alertness: Awake/alert Behavior During Therapy: WFL for tasks assessed/performed Overall Cognitive Status: Within Functional Limits for tasks assessed  General Comments  SpO2 96% on RA, 90% with mobility     Home Living Family/patient expects to be discharged to:: Private residence Living Arrangements: Alone Available Help at Discharge: Friend(s);Available PRN/intermittently Type of Home: Independent living facility                       Home Equipment: Rollator (4 wheels)          Prior Functioning/Environment Prior Level of Function : Independent/Modified Independent;Driving               ADLs Comments: reports  family friend assists PRN for IADLs        OT Problem List: Decreased activity tolerance;Decreased safety awareness         OT Goals(Current goals can be found in the care plan section) Acute Rehab OT Goals Patient Stated Goal: to go home OT Goal Formulation: With patient Time For Goal Achievement: 08/27/22 Potential to Achieve Goals: Good   AM-PAC OT "6 Clicks" Daily Activity     Outcome Measure Help from another person eating meals?: None Help from another person taking care of personal grooming?: A Little Help from another person toileting, which includes using toliet, bedpan, or urinal?: A Little Help from another person bathing (including washing, rinsing, drying)?: A Little Help from another person to put on and taking off regular upper body clothing?: None Help from another person to put on and taking off regular lower body clothing?: None 6 Click Score: 21   End of Session Nurse Communication: Mobility status  Activity Tolerance: Patient tolerated treatment well Patient left: in chair;with call bell/phone within reach;with chair alarm set  OT Visit Diagnosis: Other abnormalities of gait and mobility (R26.89)                Time: 2035-5974 OT Time Calculation (min): 37 min Charges:  OT General Charges $OT Visit: 1 Visit OT Evaluation $OT Eval Moderate Complexity: 1 Mod OT Treatments $Self Care/Home Management : 23-37 mins  Nicholas [name], M.S. OTR/L  08/13/22, 1:31 PM  ascom [phone]

## 2022-08-13 NOTE — Assessment & Plan Note (Addendum)
--  had prior hx of retention --required 2 I/O before Foley inserted Plan: --cont Foley for bladder decompression
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
## 2022-08-14 DIAGNOSIS — J1282 Pneumonia due to coronavirus disease 2019: Secondary | ICD-10-CM | POA: Diagnosis not present

## 2022-08-14 DIAGNOSIS — U071 COVID-19: Secondary | ICD-10-CM | POA: Diagnosis not present

## 2022-08-14 LAB — BASIC METABOLIC PANEL
Anion gap: 8 (ref 5–15)
BUN: 29 mg/dL — ABNORMAL HIGH (ref 8–23)
CO2: 26 mmol/L (ref 22–32)
Calcium: 8.5 mg/dL — ABNORMAL LOW (ref 8.9–10.3)
Chloride: 107 mmol/L (ref 98–111)
Creatinine, Ser: 1.1 mg/dL (ref 0.61–1.24)
GFR, Estimated: >60 mL/min (ref >60)
Glucose, Bld: 127 mg/dL — ABNORMAL HIGH (ref 70–99)
Potassium: 4.8 mmol/L (ref 3.5–5.1)
Sodium: 141 mmol/L (ref 135–145)

## 2022-08-14 LAB — CBC
HCT: 44.3 % (ref 39.0–52.0)
Hemoglobin: 14.5 g/dL (ref 13.0–17.0)
MCH: 31.5 pg (ref 26.0–34.0)
MCHC: 32.7 g/dL (ref 30.0–36.0)
MCV: 96.3 fL (ref 80.0–100.0)
Platelets: 165 10*3/uL (ref 150–400)
RBC: 4.6 MIL/uL (ref 4.22–5.81)
RDW: 13.2 % (ref 11.5–15.5)
WBC: 11.4 10*3/uL — ABNORMAL HIGH (ref 4.0–10.5)
nRBC: 0 % (ref 0.0–0.2)

## 2022-08-14 LAB — CK: Total CK: 1836 U/L — ABNORMAL HIGH (ref 49–397)

## 2022-08-14 LAB — C-REACTIVE PROTEIN: CRP: 1.4 mg/dL — ABNORMAL HIGH (ref <1.0)

## 2022-08-14 LAB — MAGNESIUM: Magnesium: 2.1 mg/dL (ref 1.7–2.4)

## 2022-08-14 MED ORDER — PHENOL 1.4 % MT LIQD
1.0000 | OROMUCOSAL | Status: DC | PRN
  Administered 2022-08-14: 1 via OROMUCOSAL
  Filled 2022-08-14: qty 177

## 2022-08-14 MED ORDER — AMITRIPTYLINE HCL 25 MG PO TABS
100.0000 mg | ORAL_TABLET | Freq: Every day | ORAL | Status: DC
  Administered 2022-08-14: 100 mg via ORAL
  Filled 2022-08-14: qty 4

## 2022-08-14 MED ORDER — SODIUM CHLORIDE 0.9 % IV SOLN: INTRAVENOUS | Status: AC

## 2022-08-14 MED ORDER — GABAPENTIN 300 MG PO CAPS
300.0000 mg | ORAL_CAPSULE | Freq: Three times a day (TID) | ORAL | Status: DC
  Administered 2022-08-14 – 2022-08-15 (×2): 300 mg via ORAL
  Filled 2022-08-14 (×2): qty 1

## 2022-08-14 MED ORDER — GUAIFENESIN-DM 100-10 MG/5ML PO SYRP
5.0000 mL | ORAL_SOLUTION | ORAL | Status: DC | PRN
  Administered 2022-08-14: 5 mL via ORAL
  Filled 2022-08-14: qty 10

## 2022-08-14 MED ORDER — TAMSULOSIN HCL 0.4 MG PO CAPS
0.4000 mg | ORAL_CAPSULE | Freq: Every day | ORAL | Status: DC
  Administered 2022-08-14 – 2022-08-15 (×2): 0.4 mg via ORAL
  Filled 2022-08-14 (×2): qty 1

## 2022-08-14 NOTE — Progress Notes (Signed)
Pt ambulated around nurses station. Tolerated well. Minimal assist with walker and gait belt.
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
## 2022-08-14 NOTE — TOC Initial Note (Signed)
Transition of Care [facility]) - Initial/Assessment Note    Patient Details  Name: Nicholas [name] MRN: [id number] Date of Birth: 21-Aug-1951  [facility] CM/SW Contact:    Laurena Slimmer, RN Phone Number: 08/14/2022, 11:19 AM  Clinical Narrative:     Spoke with patient by phone to discuss discharge plan.                Admitted for: Pneumonia  Admitted from: Home( alone)  PCP: [place]: Son picks medications up Current home health/prior home health/DME: motorized wc 2 walker, 3in1 Transportation: Son  Patient agreeable to [facility] services. He does not have a preference of an agency. Referral sent to [facility] from [place].          Patient Goals and CMS Choice        Expected Discharge Plan and Services                                                Prior Living Arrangements/Services                       Activities of Daily Living Home Assistive Devices/Equipment: Gilford Rile (specify type) ADL Screening (condition at time of admission) Patient's cognitive ability adequate to safely complete daily activities?: Yes Is the patient deaf or have difficulty hearing?: No Does the patient have difficulty seeing, even when wearing glasses/contacts?: No Does the patient have difficulty concentrating, remembering, or making decisions?: Yes Patient able to express need for assistance with ADLs?: Yes Does the patient have difficulty dressing or bathing?: Yes Independently performs ADLs?: No Communication: Independent Dressing (OT): Needs assistance Is this a change from baseline?: Change from baseline, expected to last >3 days Grooming: Needs assistance Is this a change from baseline?: Change from baseline, expected to last >3 days Feeding: Independent Bathing: Needs assistance Is this a change from baseline?: Change from baseline, expected to last >3 days Toileting: Needs assistance Is this a change from baseline?: Change from  baseline, expected to last >3days In/Out Bed: Needs assistance Is this a change from baseline?: Change from baseline, expected to last >3 days Walks in Home: Needs assistance Is this a change from baseline?: Change from baseline, expected to last >3 days Does the patient have difficulty walking or climbing stairs?: Yes Weakness of Legs: Both Weakness of Arms/Hands: None  Permission Sought/Granted                  Emotional Assessment              Admission diagnosis:  Acute encephalopathy [G93.40] Patient Active Problem List   Diagnosis Date Noted   Acute urinary retention 08/13/2022   History of seizure 08/13/2022   Acute encephalopathy 08/12/2022   Pneumonia due to COVID-19 virus 08/12/2022   AKI (acute kidney injury) ([place]) 08/12/2022   Rhabdomyolysis 08/12/2022   PND (post-nasal drip) 07/25/2022   Oral candida 07/15/2022   Sore throat 07/15/2022   Open wound of foot with complication, left, initial encounter 06/16/2022   Hx of excision of lamina of lumbar vertebra for decompression of spinal cord 03/20/2022   Anemia 02/28/2022   Dysuria 02/28/2022   Peripheral edema 02/28/2022   Recurrent major depressive disorder ([place]) 02/28/2022   Chronic headache 02/28/2022   Recurrent falls 08/25/2021   Memory difficulty 07/04/2021  Chronic toe ulcer, left, with fat layer exposed ([place]) 01/23/2021   Pituitary adenoma ([place]) 09/06/2020   Chronic intractable headache 07/27/2020   Vitamin B12 deficiency 03/31/2020   Vitamin D deficiency 03/31/2020   Numbness of foot 03/25/2020   Status post amputation of great toe, left ([place]) 12/14/2019   Callus of foot 04/02/2019   Allergy to bee sting 03/11/2019   Insomnia 02/10/2019   Status post lumbar spine operative procedure for decompression of spinal cord 12/12/2018   Dyslipidemia 10/19/2018   Foot deformity 10/19/2018   Status post reverse total replacement of right shoulder 05/13/2018   Chronic diastolic CHF (congestive heart  failure) ([place]) 12/27/2017   OSA (obstructive sleep apnea) 12/27/2017   CKD (chronic kidney disease) stage 3, GFR 30-59 ml/min (HCC) 10/01/2017   Fatty liver 10/01/2017   Status post reverse total replacement of left shoulder 09/03/2017   Bilateral shoulder pain 05/24/2017   History of CVA in adulthood 03/08/2017   S/P total knee arthroplasty, left 01/30/2017   Osteoarthritis of knees, bilateral 01/15/2017   Obesity, morbid, BMI 40.0-49.9 ([place]) 07/19/2016   Hypothyroidism    Iron deficiency 04/23/2016   Chronic pain syndrome 04/23/2016   Inflammatory arthritis 04/23/2016   Chronic leg pain 04/23/2016   Lesion of palate 04/23/2016   Grieving 04/23/2016   GERD (gastroesophageal reflux disease)    Duodenitis determined by biopsy 02/13/2016   Diverticulosis of colon without hemorrhage 92/92/4462   Complicated grief 86/38/1771   Migraine 01/11/2012   History of methicillin resistant staphylococcus aureus (MRSA) 07/04/2010   DDD (degenerative disc disease), lumbar 07/04/2010   History of total right knee replacement 07/04/2010   Contact dermatitis 07/04/2010   Lesion of ulnar nerve 07/04/2010   Closed fracture of malar bone ([place]) 07/04/2010   Methicillin susceptible Staphylococcus aureus infection 07/04/2010   Narrowing of intervertebral disc space 07/04/2010   Olecranon bursitis 07/04/2010   History of artificial joint 07/04/2010   Primary osteoarthritis, right shoulder 06/09/2007   Osteoarthritis 06/09/2007   Personal history of colonic adenoma 06/01/2003   PCP:  [name], MD Pharmacy:   [facility] - [place], [place] - [address] [place] [address] Phone: [phone] Fax: [phone]     Social Determinants of Health (SDOH) Interventions    Readmission Risk Interventions     No data to display

## 2022-08-15 MED ORDER — TAMSULOSIN HCL 0.4 MG PO CAPS: 0.4000 mg | ORAL_CAPSULE | Freq: Every day | ORAL | 0 refills | Status: DC

## 2022-08-15 MED ORDER — NIRMATRELVIR/RITONAVIR (PAXLOVID) TABLET (RENAL DOSING): 2.0000 | ORAL_TABLET | Freq: Two times a day (BID) | ORAL | 0 refills | Status: DC

## 2022-08-15 MED ORDER — ATORVASTATIN CALCIUM 20 MG PO TABS: 20.0000 mg | ORAL_TABLET | Freq: Every day | ORAL | Status: DC

## 2022-08-15 MED ORDER — PANTOPRAZOLE SODIUM 40 MG PO TBEC
40.0000 mg | DELAYED_RELEASE_TABLET | ORAL | Status: DC
  Administered 2022-08-15: 40 mg via ORAL
  Filled 2022-08-15: qty 1

## 2022-08-15 NOTE — TOC Progression Note (Addendum)
Transition of Care [facility]) - Progression Note    Patient Details  Name: Nicholas [name] MRN: [id number] Date of Birth: 02/05/1951  [facility]) CM/SW Contact  Laurena Slimmer, RN Phone Number: 08/15/2022, 1:50 PM  Clinical Narrative:    Patient is not able to walk the distance required to go the bathroom, or he/she is unable to safely negotiate stairs required to access the bathroom.  A BSC will alleviate this problem    Expected Discharge Plan: [place] Barriers to Discharge: Continued Medical Work up  Expected Discharge Plan and Services Expected Discharge Plan: [place] Choice: [place] arrangements for the past 2 months: Single Family Home                           HH Arranged: PT [place]: [place] Date [place]: 08/14/22 Time [place]: 1139 Representative spoke with at [place]: [place] Determinants of Health ([place]) Interventions    Readmission Risk Interventions     No data to display
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
## 2022-08-15 NOTE — TOC Transition Note (Addendum)
Transition of Care [facility]) - CM/SW Discharge Note   Patient Details  Name: Nicholas [name] MRN: [id number] Date of Birth: 01-15-1951  Transition of Care [facility]) CM/SW Contact:  Laurena Slimmer, RN Phone Number: 08/15/2022, 2:54 PM   Clinical Narrative:    Spoke with patient regarding discharge. Patient stated his son would be coming to transport him home. He was advised someone with [facility] would contact him to schedule an appointment for [facility]. CM requested bsc  via [employer].  Attempt to contact patient's son. No answer. Unable to leave a message. Patient son previously advised of discharge per MD.       Barriers to Discharge: Continued Medical Work up   Patient Goals and CMS Choice Patient states their goals for this hospitalization and ongoing recovery are:: Home CMS Medicare.gov Compare Post Acute Care list provided to:: Patient Choice offered to / list presented to : Patient  Discharge Placement                       Discharge Plan and Services     Post Acute Care Choice: Home Health                    HH Arranged: PT [facility] Agency: [place] Date [place]: 08/14/22 Time [place]: 2518 Representative spoke with at [place]: Malachy Mood  Social Determinants of Health ([place]) Interventions     Readmission Risk Interventions     No data to display

## 2022-08-15 NOTE — Progress Notes (Signed)
Preparing for discharge, spoke with son will come transport within an hour. Belongings gathered in room.

## 2022-08-15 NOTE — Progress Notes (Signed)
Physical Therapy Treatment Patient Details Name: Nicholas [name] MRN: [id number] DOB: [birth year] Today's Date: 08/15/2022   History of Present Illness Nicholas [name] is a 71 y.o. male with medical history significant of , hypothyroidism, seizures on Depakote, CVA (2010), fatty liver, who presents to the ED with complaints of back and leg pain    PT Comments    Pt ambulated around RN station with RW and safe technique. Imminent urge for BM during hallway ambulation. INcreased SOB symptoms noted, however responds well to cues. Pt is hoping to for dc this date. Will continue to progress.   Recommendations for follow up therapy are one component of a multi-disciplinary discharge planning process, led by the attending physician.  Recommendations may be updated based on patient status, additional functional criteria and insurance authorization.  Follow Up Recommendations  Home health PT     Assistance Recommended at Discharge PRN  Patient can return home with the following A little help with walking and/or transfers;A little help with bathing/dressing/bathroom;Assist for transportation   Equipment Recommendations  BSC/3in1 (is also requesting pack of wipes to take home)    Recommendations for Other Services       Precautions / Restrictions Precautions Precautions: Fall Restrictions Weight Bearing Restrictions: No     Mobility  Bed Mobility Overal bed mobility: Needs Assistance Bed Mobility: Supine to Sit     Supine to sit: Min guard     General bed mobility comments: cues for sequencing, keeps R LE in full extension.    Transfers Overall transfer level: Needs assistance Equipment used: Rolling walker (2 wheels) Transfers: Sit to/from Stand Sit to Stand: Supervision           General transfer comment: upright posture. Needs cues for correct hand placement    Ambulation/Gait Ambulation/Gait assistance: Min guard Gait Distance (Feet): 200 Feet Assistive device:  Rolling walker (2 wheels) Gait Pattern/deviations: Step-through pattern       General Gait Details: ambulated around RN station, however on last hallway, reports imminent need to have BM and within 5 sec began having loose BM on floor leaving trail to room. RN at desk notified to assist in cleanup. Once back in room, ambulated to close [facility]   Stairs             Wheelchair Mobility    Modified Rankin (Stroke Patients Only)       Balance Overall balance assessment: Needs assistance Sitting-balance support: No upper extremity supported, Feet supported Sitting balance-Leahy Scale: Normal     Standing balance support: Bilateral upper extremity supported Standing balance-Leahy Scale: Good                              Cognition Arousal/Alertness: Awake/alert Behavior During Therapy: WFL for tasks assessed/performed Overall Cognitive Status: Within Functional Limits for tasks assessed                                 General Comments: cogntion at baseline per patient report        Exercises Other Exercises Other Exercises: ambulated to [facility] with cga for transfers. Pt able to use wipes to perform self hygiene with safe technique. +BM    General Comments        Pertinent Vitals/Pain Pain Assessment Pain Assessment: No/denies pain    Home Living  Prior Function            PT Goals (current goals can now be found in the care plan section) Acute Rehab PT Goals Patient Stated Goal: to go home PT Goal Formulation: With patient Time For Goal Achievement: 08/27/22 Potential to Achieve Goals: Good Progress towards PT goals: Progressing toward goals    Frequency    Min 2X/week      PT Plan Current plan remains appropriate    Co-evaluation              AM-PAC PT "6 Clicks" Mobility   Outcome Measure  Help needed turning from your back to your side while in a flat bed without using  bedrails?: None Help needed moving from lying on your back to sitting on the side of a flat bed without using bedrails?: None Help needed moving to and from a bed to a chair (including a wheelchair)?: A Little Help needed standing up from a chair using your arms (e.g., wheelchair or bedside chair)?: A Little Help needed to walk in hospital room?: A Little Help needed climbing 3-5 steps with a railing? : A Little 6 Click Score: 20    End of Session Equipment Utilized During Treatment: Gait belt Activity Tolerance: Patient tolerated treatment well Patient left: in chair;with chair alarm set Nurse Communication: Mobility status PT Visit Diagnosis: Unsteadiness on feet (R26.81);Muscle weakness (generalized) (M62.81);Difficulty in walking, not elsewhere classified (R26.2)     Time: 1610-9604 PT Time Calculation (min) (ACUTE ONLY): 33 min  Charges:  $Gait Training: 8-22 mins $Therapeutic Activity: 8-22 mins                     Elizabeth Palau, PT, DPT, GCS [phone]    Lakeria Starkman 08/15/2022, 12:20 PM

## 2022-08-15 NOTE — Care Management Important Message (Signed)
Important Message  Patient Details  Name: Nicholas [name] MRN: [id number] Date of Birth: 06-Jan-1951   Medicare Important Message Given:  N/A - LOS <3 / Initial given by admissions     Nicholas [name] 08/15/2022, 8:23 AM

## 2022-08-16 ENCOUNTER — Other Ambulatory Visit: Payer: Self-pay

## 2022-08-16 ENCOUNTER — Emergency Department: Payer: Medicare Other

## 2022-08-16 ENCOUNTER — Inpatient Hospital Stay
Admission: EM | Admit: 2022-08-16 | Discharge: 2022-08-25 | DRG: 871 | Disposition: A | Discharge status: Home Health | Payer: Medicare Other | Attending: Student in an Organized Health Care Education/Training Program | Admitting: Student in an Organized Health Care Education/Training Program

## 2022-08-16 ENCOUNTER — Other Ambulatory Visit: Payer: Self-pay | Admitting: Family Medicine

## 2022-08-16 ENCOUNTER — Telehealth: Payer: Self-pay

## 2022-08-16 DIAGNOSIS — G40909 Epilepsy, unspecified, not intractable, without status epilepticus: Secondary | ICD-10-CM

## 2022-08-16 DIAGNOSIS — I700 Atherosclerosis of aorta: Secondary | ICD-10-CM | POA: Diagnosis not present

## 2022-08-16 DIAGNOSIS — E11621 Type 2 diabetes mellitus with foot ulcer: Secondary | ICD-10-CM | POA: Diagnosis not present

## 2022-08-16 DIAGNOSIS — E669 Obesity, unspecified: Secondary | ICD-10-CM | POA: Diagnosis present

## 2022-08-16 DIAGNOSIS — R0602 Shortness of breath: Secondary | ICD-10-CM

## 2022-08-16 DIAGNOSIS — Z823 Family history of stroke: Secondary | ICD-10-CM

## 2022-08-16 DIAGNOSIS — B9561 Methicillin susceptible Staphylococcus aureus infection as the cause of diseases classified elsewhere: Secondary | ICD-10-CM

## 2022-08-16 DIAGNOSIS — R079 Chest pain, unspecified: Secondary | ICD-10-CM | POA: Diagnosis not present

## 2022-08-16 DIAGNOSIS — I130 Hypertensive heart and chronic kidney disease with heart failure and stage 1 through stage 4 chronic kidney disease, or unspecified chronic kidney disease: Secondary | ICD-10-CM | POA: Diagnosis present

## 2022-08-16 DIAGNOSIS — I2489 Other forms of acute ischemic heart disease: Secondary | ICD-10-CM | POA: Diagnosis present

## 2022-08-16 DIAGNOSIS — Z7901 Long term (current) use of anticoagulants: Secondary | ICD-10-CM

## 2022-08-16 DIAGNOSIS — Z8711 Personal history of peptic ulcer disease: Secondary | ICD-10-CM | POA: Diagnosis not present

## 2022-08-16 DIAGNOSIS — E039 Hypothyroidism, unspecified: Secondary | ICD-10-CM | POA: Diagnosis present

## 2022-08-16 DIAGNOSIS — R0789 Other chest pain: Secondary | ICD-10-CM | POA: Diagnosis not present

## 2022-08-16 DIAGNOSIS — I4891 Unspecified atrial fibrillation: Secondary | ICD-10-CM | POA: Diagnosis not present

## 2022-08-16 DIAGNOSIS — Z89412 Acquired absence of left great toe: Secondary | ICD-10-CM | POA: Diagnosis not present

## 2022-08-16 DIAGNOSIS — Z96653 Presence of artificial knee joint, bilateral: Secondary | ICD-10-CM | POA: Diagnosis present

## 2022-08-16 DIAGNOSIS — I493 Ventricular premature depolarization: Secondary | ICD-10-CM | POA: Diagnosis present

## 2022-08-16 DIAGNOSIS — I4819 Other persistent atrial fibrillation: Secondary | ICD-10-CM | POA: Diagnosis not present

## 2022-08-16 DIAGNOSIS — Z6837 Body mass index (BMI) 37.0-37.9, adult: Secondary | ICD-10-CM

## 2022-08-16 DIAGNOSIS — J9601 Acute respiratory failure with hypoxia: Secondary | ICD-10-CM | POA: Diagnosis present

## 2022-08-16 DIAGNOSIS — Z8673 Personal history of transient ischemic attack (TIA), and cerebral infarction without residual deficits: Secondary | ICD-10-CM

## 2022-08-16 DIAGNOSIS — Z743 Need for continuous supervision: Secondary | ICD-10-CM | POA: Diagnosis not present

## 2022-08-16 DIAGNOSIS — Z89422 Acquired absence of other left toe(s): Secondary | ICD-10-CM | POA: Diagnosis not present

## 2022-08-16 DIAGNOSIS — I330 Acute and subacute infective endocarditis: Secondary | ICD-10-CM | POA: Diagnosis not present

## 2022-08-16 DIAGNOSIS — Z8249 Family history of ischemic heart disease and other diseases of the circulatory system: Secondary | ICD-10-CM

## 2022-08-16 DIAGNOSIS — Z91040 Latex allergy status: Secondary | ICD-10-CM

## 2022-08-16 DIAGNOSIS — I5032 Chronic diastolic (congestive) heart failure: Secondary | ICD-10-CM | POA: Diagnosis not present

## 2022-08-16 DIAGNOSIS — R778 Other specified abnormalities of plasma proteins: Secondary | ICD-10-CM | POA: Diagnosis not present

## 2022-08-16 DIAGNOSIS — R7881 Bacteremia: Secondary | ICD-10-CM | POA: Diagnosis not present

## 2022-08-16 DIAGNOSIS — R6889 Other general symptoms and signs: Secondary | ICD-10-CM | POA: Diagnosis not present

## 2022-08-16 DIAGNOSIS — Z91010 Allergy to peanuts: Secondary | ICD-10-CM

## 2022-08-16 DIAGNOSIS — A419 Sepsis, unspecified organism: Secondary | ICD-10-CM | POA: Diagnosis not present

## 2022-08-16 DIAGNOSIS — Z01810 Encounter for preprocedural cardiovascular examination: Secondary | ICD-10-CM | POA: Diagnosis not present

## 2022-08-16 DIAGNOSIS — Q2112 Patent foramen ovale: Secondary | ICD-10-CM

## 2022-08-16 DIAGNOSIS — E1122 Type 2 diabetes mellitus with diabetic chronic kidney disease: Secondary | ICD-10-CM | POA: Diagnosis not present

## 2022-08-16 DIAGNOSIS — Z79899 Other long term (current) drug therapy: Secondary | ICD-10-CM

## 2022-08-16 DIAGNOSIS — I351 Nonrheumatic aortic (valve) insufficiency: Secondary | ICD-10-CM | POA: Diagnosis not present

## 2022-08-16 DIAGNOSIS — G4733 Obstructive sleep apnea (adult) (pediatric): Secondary | ICD-10-CM | POA: Diagnosis not present

## 2022-08-16 DIAGNOSIS — E785 Hyperlipidemia, unspecified: Secondary | ICD-10-CM | POA: Diagnosis not present

## 2022-08-16 DIAGNOSIS — W19XXXA Unspecified fall, initial encounter: Secondary | ICD-10-CM | POA: Diagnosis not present

## 2022-08-16 DIAGNOSIS — Z833 Family history of diabetes mellitus: Secondary | ICD-10-CM

## 2022-08-16 DIAGNOSIS — Z803 Family history of malignant neoplasm of breast: Secondary | ICD-10-CM

## 2022-08-16 DIAGNOSIS — L97509 Non-pressure chronic ulcer of other part of unspecified foot with unspecified severity: Secondary | ICD-10-CM | POA: Diagnosis not present

## 2022-08-16 DIAGNOSIS — R296 Repeated falls: Secondary | ICD-10-CM | POA: Diagnosis present

## 2022-08-16 DIAGNOSIS — I252 Old myocardial infarction: Secondary | ICD-10-CM

## 2022-08-16 DIAGNOSIS — Z96611 Presence of right artificial shoulder joint: Secondary | ICD-10-CM | POA: Diagnosis present

## 2022-08-16 DIAGNOSIS — R531 Weakness: Secondary | ICD-10-CM | POA: Diagnosis not present

## 2022-08-16 DIAGNOSIS — S20212A Contusion of left front wall of thorax, initial encounter: Secondary | ICD-10-CM | POA: Diagnosis present

## 2022-08-16 DIAGNOSIS — I340 Nonrheumatic mitral (valve) insufficiency: Secondary | ICD-10-CM | POA: Diagnosis not present

## 2022-08-16 DIAGNOSIS — I2510 Atherosclerotic heart disease of native coronary artery without angina pectoris: Secondary | ICD-10-CM | POA: Diagnosis not present

## 2022-08-16 DIAGNOSIS — J9811 Atelectasis: Secondary | ICD-10-CM | POA: Diagnosis not present

## 2022-08-16 DIAGNOSIS — I959 Hypotension, unspecified: Secondary | ICD-10-CM | POA: Diagnosis not present

## 2022-08-16 DIAGNOSIS — I499 Cardiac arrhythmia, unspecified: Secondary | ICD-10-CM | POA: Diagnosis not present

## 2022-08-16 DIAGNOSIS — I480 Paroxysmal atrial fibrillation: Secondary | ICD-10-CM

## 2022-08-16 DIAGNOSIS — U071 COVID-19: Secondary | ICD-10-CM

## 2022-08-16 DIAGNOSIS — G43909 Migraine, unspecified, not intractable, without status migrainosus: Secondary | ICD-10-CM | POA: Diagnosis not present

## 2022-08-16 DIAGNOSIS — I339 Acute and subacute endocarditis, unspecified: Secondary | ICD-10-CM

## 2022-08-16 DIAGNOSIS — Z888 Allergy status to other drugs, medicaments and biological substances status: Secondary | ICD-10-CM

## 2022-08-16 DIAGNOSIS — J45909 Unspecified asthma, uncomplicated: Secondary | ICD-10-CM | POA: Diagnosis not present

## 2022-08-16 LAB — CBC WITH DIFFERENTIAL/PLATELET
Abs Immature Granulocytes: 0.14 10*3/uL — ABNORMAL HIGH (ref 0.00–0.07)
Basophils Absolute: 0 10*3/uL (ref 0.0–0.1)
Basophils Relative: 0 %
Eosinophils Absolute: 0 10*3/uL (ref 0.0–0.5)
Eosinophils Relative: 0 %
HCT: 44.4 % (ref 39.0–52.0)
Hemoglobin: 14.7 g/dL (ref 13.0–17.0)
Immature Granulocytes: 1 %
Lymphocytes Relative: 3 %
Lymphs Abs: 0.4 10*3/uL — ABNORMAL LOW (ref 0.7–4.0)
MCH: 31.7 pg (ref 26.0–34.0)
MCHC: 33.1 g/dL (ref 30.0–36.0)
MCV: 95.9 fL (ref 80.0–100.0)
Monocytes Absolute: 1.6 10*3/uL — ABNORMAL HIGH (ref 0.1–1.0)
Monocytes Relative: 12 %
Neutro Abs: 10.9 10*3/uL — ABNORMAL HIGH (ref 1.7–7.7)
Neutrophils Relative %: 84 %
Platelets: 168 10*3/uL (ref 150–400)
RBC: 4.63 MIL/uL (ref 4.22–5.81)
RDW: 13.2 % (ref 11.5–15.5)
WBC: 13 10*3/uL — ABNORMAL HIGH (ref 4.0–10.5)
nRBC: 0 % (ref 0.0–0.2)

## 2022-08-16 LAB — COMPREHENSIVE METABOLIC PANEL
ALT: 48 U/L — ABNORMAL HIGH (ref 0–44)
AST: 65 U/L — ABNORMAL HIGH (ref 15–41)
Albumin: 3.3 g/dL — ABNORMAL LOW (ref 3.5–5.0)
Alkaline Phosphatase: 56 U/L (ref 38–126)
Anion gap: 14 (ref 5–15)
BUN: 27 mg/dL — ABNORMAL HIGH (ref 8–23)
CO2: 22 mmol/L (ref 22–32)
Calcium: 8.1 mg/dL — ABNORMAL LOW (ref 8.9–10.3)
Chloride: 102 mmol/L (ref 98–111)
Creatinine, Ser: 1.16 mg/dL (ref 0.61–1.24)
GFR, Estimated: >60 mL/min (ref >60)
Glucose, Bld: 125 mg/dL — ABNORMAL HIGH (ref 70–99)
Potassium: 3.5 mmol/L (ref 3.5–5.1)
Sodium: 138 mmol/L (ref 135–145)
Total Bilirubin: 1.5 mg/dL — ABNORMAL HIGH (ref 0.3–1.2)
Total Protein: 7 g/dL (ref 6.5–8.1)

## 2022-08-16 LAB — TROPONIN I (HIGH SENSITIVITY): Troponin I (High Sensitivity): 56 ng/L — ABNORMAL HIGH (ref <18)

## 2022-08-16 MED ORDER — ONDANSETRON HCL 4 MG PO TABS
4.0000 mg | ORAL_TABLET | Freq: Four times a day (QID) | ORAL | Status: DC | PRN
  Administered 2022-08-25: 4 mg via ORAL
  Filled 2022-08-16: qty 1

## 2022-08-16 MED ORDER — POTASSIUM CHLORIDE IN NACL 20-0.9 MEQ/L-% IV SOLN
INTRAVENOUS | Status: DC
  Filled 2022-08-16 (×6): qty 1000

## 2022-08-16 MED ORDER — FERROUS SULFATE 325 (65 FE) MG PO TABS
325.0000 mg | ORAL_TABLET | Freq: Every day | ORAL | Status: DC
  Administered 2022-08-17 – 2022-08-25 (×8): 325 mg via ORAL
  Filled 2022-08-16 (×9): qty 1

## 2022-08-16 MED ORDER — UBROGEPANT 100 MG PO TABS: 100.0000 mg | ORAL_TABLET | ORAL | Status: DC | PRN

## 2022-08-16 MED ORDER — METHYLPREDNISOLONE SODIUM SUCC 125 MG IJ SOLR
0.5000 mg/kg | Freq: Two times a day (BID) | INTRAMUSCULAR | Status: DC
  Administered 2022-08-17 – 2022-08-19 (×5): 53.125 mg via INTRAVENOUS
  Filled 2022-08-16 (×6): qty 2

## 2022-08-16 MED ORDER — LEVOTHYROXINE SODIUM 112 MCG PO TABS
112.0000 ug | ORAL_TABLET | Freq: Every day | ORAL | Status: DC
  Administered 2022-08-17 – 2022-08-25 (×8): 112 ug via ORAL
  Filled 2022-08-16 (×10): qty 1

## 2022-08-16 MED ORDER — EPINEPHRINE 0.3 MG/0.3ML IJ SOAJ: 0.3000 mg | INTRAMUSCULAR | Status: DC | PRN

## 2022-08-16 MED ORDER — SODIUM CHLORIDE 0.9 % IV BOLUS
1000.0000 mL | Freq: Once | INTRAVENOUS | Status: AC
  Administered 2022-08-16: 1000 mL via INTRAVENOUS

## 2022-08-16 MED ORDER — MAGNESIUM HYDROXIDE 400 MG/5ML PO SUSP: 30.0000 mL | Freq: Every day | ORAL | Status: DC | PRN

## 2022-08-16 MED ORDER — PANTOPRAZOLE SODIUM 40 MG PO TBEC
40.0000 mg | DELAYED_RELEASE_TABLET | ORAL | Status: DC
  Administered 2022-08-17 – 2022-08-25 (×5): 40 mg via ORAL
  Filled 2022-08-16 (×6): qty 1

## 2022-08-16 MED ORDER — ATORVASTATIN CALCIUM 20 MG PO TABS: 20.0000 mg | ORAL_TABLET | Freq: Every day | ORAL | Status: DC

## 2022-08-16 MED ORDER — FLUTICASONE PROPIONATE 50 MCG/ACT NA SUSP
2.0000 | Freq: Every day | NASAL | Status: DC
  Administered 2022-08-17 – 2022-08-25 (×8): 2 via NASAL
  Filled 2022-08-16: qty 16

## 2022-08-16 MED ORDER — ZINC SULFATE 220 (50 ZN) MG PO CAPS
220.0000 mg | ORAL_CAPSULE | Freq: Every day | ORAL | Status: DC
  Administered 2022-08-17 – 2022-08-25 (×8): 220 mg via ORAL
  Filled 2022-08-16 (×8): qty 1

## 2022-08-16 MED ORDER — GABAPENTIN 300 MG PO CAPS: 300.0000 mg | ORAL_CAPSULE | ORAL | Status: DC

## 2022-08-16 MED ORDER — ONDANSETRON HCL 4 MG/2ML IJ SOLN: 4.0000 mg | Freq: Four times a day (QID) | INTRAMUSCULAR | Status: DC | PRN

## 2022-08-16 MED ORDER — FUROSEMIDE 20 MG PO TABS
20.0000 mg | ORAL_TABLET | Freq: Every morning | ORAL | Status: DC
  Administered 2022-08-17 – 2022-08-25 (×8): 20 mg via ORAL
  Filled 2022-08-16 (×8): qty 1

## 2022-08-16 MED ORDER — ONDANSETRON 4 MG PO TBDP: 4.0000 mg | ORAL_TABLET | Freq: Three times a day (TID) | ORAL | Status: DC | PRN

## 2022-08-16 MED ORDER — GUAIFENESIN-DM 100-10 MG/5ML PO SYRP
10.0000 mL | ORAL_SOLUTION | ORAL | Status: DC | PRN
  Administered 2022-08-18: 10 mL via ORAL
  Filled 2022-08-16 (×2): qty 10

## 2022-08-16 MED ORDER — TAMSULOSIN HCL 0.4 MG PO CAPS
0.4000 mg | ORAL_CAPSULE | Freq: Every day | ORAL | Status: DC
  Administered 2022-08-17 – 2022-08-25 (×8): 0.4 mg via ORAL
  Filled 2022-08-16 (×8): qty 1

## 2022-08-16 MED ORDER — NITROGLYCERIN 0.4 MG SL SUBL: 0.4000 mg | SUBLINGUAL_TABLET | SUBLINGUAL | Status: DC | PRN

## 2022-08-16 MED ORDER — LORATADINE 10 MG PO TABS
10.0000 mg | ORAL_TABLET | Freq: Every day | ORAL | Status: DC
  Administered 2022-08-17 – 2022-08-25 (×8): 10 mg via ORAL
  Filled 2022-08-16 (×8): qty 1

## 2022-08-16 MED ORDER — TIZANIDINE HCL 4 MG PO TABS
4.0000 mg | ORAL_TABLET | Freq: Four times a day (QID) | ORAL | Status: DC | PRN
  Administered 2022-08-19: 4 mg via ORAL
  Filled 2022-08-16 (×2): qty 1

## 2022-08-16 MED ORDER — METHOCARBAMOL 500 MG PO TABS: 500.0000 mg | ORAL_TABLET | Freq: Three times a day (TID) | ORAL | Status: DC | PRN

## 2022-08-16 MED ORDER — VITAMIN D3 25 MCG (1000 UNIT) PO TABS
1000.0000 [IU] | ORAL_TABLET | Freq: Every day | ORAL | Status: DC
  Administered 2022-08-17 – 2022-08-25 (×8): 1000 [IU] via ORAL
  Filled 2022-08-16 (×17): qty 1

## 2022-08-16 MED ORDER — ENOXAPARIN SODIUM 60 MG/0.6ML IJ SOSY
0.5000 mg/kg | PREFILLED_SYRINGE | INTRAMUSCULAR | Status: DC
  Administered 2022-08-17: 52.5 mg via SUBCUTANEOUS
  Filled 2022-08-16: qty 0.6

## 2022-08-16 MED ORDER — NIRMATRELVIR/RITONAVIR (PAXLOVID) TABLET (RENAL DOSING)
2.0000 | ORAL_TABLET | Freq: Two times a day (BID) | ORAL | Status: DC
  Administered 2022-08-17 – 2022-08-18 (×4): 2 via ORAL
  Filled 2022-08-16: qty 20

## 2022-08-16 MED ORDER — TRAZODONE HCL 50 MG PO TABS
25.0000 mg | ORAL_TABLET | Freq: Every evening | ORAL | Status: DC | PRN
  Administered 2022-08-21: 25 mg via ORAL
  Filled 2022-08-16: qty 1

## 2022-08-16 MED ORDER — HYDROCOD POLI-CHLORPHE POLI ER 10-8 MG/5ML PO SUER: 5.0000 mL | Freq: Two times a day (BID) | ORAL | Status: DC | PRN

## 2022-08-16 MED ORDER — VITAMIN C 500 MG PO TABS
500.0000 mg | ORAL_TABLET | Freq: Every day | ORAL | Status: DC
  Administered 2022-08-17 – 2022-08-25 (×8): 500 mg via ORAL
  Filled 2022-08-16 (×8): qty 1

## 2022-08-16 MED ORDER — DIVALPROEX SODIUM ER 500 MG PO TB24
500.0000 mg | ORAL_TABLET | Freq: Every day | ORAL | Status: DC
  Administered 2022-08-17 – 2022-08-25 (×8): 500 mg via ORAL
  Filled 2022-08-16 (×2): qty 1
  Filled 2022-08-16: qty 2
  Filled 2022-08-16 (×6): qty 1

## 2022-08-16 MED ORDER — ONDANSETRON HCL 4 MG/2ML IJ SOLN
4.0000 mg | Freq: Once | INTRAMUSCULAR | Status: AC
  Administered 2022-08-16: 4 mg via INTRAVENOUS
  Filled 2022-08-16: qty 2

## 2022-08-16 MED ORDER — METOPROLOL SUCCINATE ER 100 MG PO TB24
100.0000 mg | ORAL_TABLET | Freq: Every day | ORAL | Status: DC
  Administered 2022-08-17 – 2022-08-25 (×8): 100 mg via ORAL
  Filled 2022-08-16 (×6): qty 1
  Filled 2022-08-16: qty 2
  Filled 2022-08-16: qty 1

## 2022-08-16 MED ORDER — PREDNISONE 50 MG PO TABS
50.0000 mg | ORAL_TABLET | Freq: Every day | ORAL | Status: DC
  Administered 2022-08-20 – 2022-08-21 (×2): 50 mg via ORAL
  Filled 2022-08-16 (×2): qty 1

## 2022-08-16 MED ORDER — B COMPLEX-C PO TABS
1.0000 | ORAL_TABLET | Freq: Every day | ORAL | Status: DC
  Administered 2022-08-18 – 2022-08-25 (×6): 1 via ORAL
  Filled 2022-08-16 (×9): qty 1

## 2022-08-16 NOTE — ED Triage Notes (Signed)
Pt bib ems for a fall. Pt c/o R sided cp, bruising notable to L side of chest. Denies head injury and blood thinners.

## 2022-08-16 NOTE — ED Provider Notes (Signed)
[facility] Provider Note    Event Date/Time   First MD Initiated Contact with Patient 08/16/22 2157     (approximate)  History   Chief Complaint: Fall (Pt bib ems for a fall. Pt c/o R sided cp, bruising notable to L side of chest. Denies head injury and blood thinners. Pt was hypotensive with systolic in the 78'H, tachy in the 140's, and 80% on RA, NRB placed in route. Pt is 100% on room air in triage )  HPI  Nicholas [name] is a 71 y.o. male with a past medical history of anxiety, asthma, CHF, gastric reflux, CVA, presents to the emergency department after multiple falls.  According to the patient 4 days ago he was not feeling well feeling weak was ultimately diagnosed with COVID.  Patient states since going home the weakness has worsened and today the patient had 2 falls throughout the day due to weakness requiring help to get back up.  EMS states they were called out for the second fall in which they found the patient on the ground hypotensive with a systolic blood pressure in the 80s he was tachycardic with a heart rate in the 140s and per EMS was satting around 80% on room air.  Patient was placed on nonrebreather and brought to the emergency department.  Here the patient is awake alert he is complaining of right-sided chest pain he does appear to have some mild abrasions to the left side of his chest.  Denies any head injury.  Denies any headache.  Denies any shortness of breath but does state mild cough and congestion.  Patient does not believe he has a fever.  Denies any abdominal pain.  Denies any extremity pain, denies any hip pain.  Vital signs in the emergency department are much more reassuring with a blood pressure 129/67 a pulse rate of 80 and 100% on room air currently.  Physical Exam   Triage Vital Signs: ED Triage Vitals [08/16/22 2152]  Enc Vitals Group     BP 129/67     Pulse Rate 80     Resp 20     Temp 98.1 F (36.7 C)     Temp Source Oral      SpO2 100 %     Weight      Height      Head Circumference      Peak Flow      Pain Score 4     Pain Loc      Pain Edu?      Excl. in [place]?     Most recent vital signs: Vitals:   08/16/22 2152  BP: 129/67  Pulse: 80  Resp: 20  Temp: 98.1 F (36.7 C)  SpO2: 100%    General: Awake, no distress.  CV:  Good peripheral perfusion.  Regular rate and rhythm  Resp:  Normal effort.  Equal breath sounds bilaterally.  Mild amount of abrasion/erythema to the left chest, moderate tenderness to the right lateral chest. Abd:  No distention.  Soft, nontender.  No rebound or guarding.   ED Results / Procedures / Treatments   EKG  EKG viewed and interpreted by myself shows appears to possibly be atrial fibrillation although difficult given significant logical interference from the patient shivering/shaking.  At times when the shivering stops it appears to be more of a sinus rhythm.  Narrow QRS, normal axis, largely normal intervals with nonspecific ST changes  RADIOLOGY  Chest x-ray viewed and  interpreted by myself patient appears to have some mild opacities to the left chest. Radiology has read the x-ray as cardiomegaly with mild central congestion.   MEDICATIONS ORDERED IN ED: Medications  sodium chloride 0.9 % bolus 1,000 mL (has no administration in time range)  ondansetron (ZOFRAN) injection 4 mg (has no administration in time range)     IMPRESSION / MDM / ASSESSMENT AND PLAN / ED COURSE  I reviewed the triage vital signs and the nursing notes.  Patient's presentation is most consistent with acute presentation with potential threat to life or bodily function.  Patient presents emergency department after a fall at home today.  Patient has fallen twice today due to generalized weakness fatigue.  Patient was diagnosed with COVID 4 days ago.  Highly suspect the patient's weakness is due to the patient's ongoing COVID infection.  Patient states he was placed on Paxlovid which he has  been taking.  Patient states mild right-sided chest pain where he is tender to palpation over the right chest.  He states this is from the fall.  Denies hitting his head.  Denies LOC.  We will obtain chest x-ray, labs, we will IV hydrate and closely monitor in the emergency department.  We will send blood cultures as well.  Differential would include weakness, deconditioning, fatigue, COVID-19, electrolyte or metabolic abnormality, dehydration, pneumonia, pneumothorax, ACS.  Patient's lab work has resulted showing slight white blood cell count elevation of 13,000, otherwise reassuring CBC, chemistry shows mild LFT elevation does not appear to be significant.  Troponin elevated to 56, was 8 4 days ago.  Given the patient's slight troponin elevation significant weakness fatigue and multiple falls we will admit to the hospital service for ongoing treatment for his likely COVID induced weakness.  FINAL CLINICAL IMPRESSION(S) / ED DIAGNOSES   Weakness Falls COVID-19    Note:  This document was prepared using Dragon voice recognition software and may include unintentional dictation errors.   Harvest Dark, MD 08/16/22 2315

## 2022-08-16 NOTE — ED Triage Notes (Addendum)
Pt bib ems for a fall. Pt c/o R sided cp, bruising notable to L side of chest. Denies head injury and blood thinners. Pt was hypotensive with systolic in the 35'K, tachy in the 140's, and 80% on RA. Pt is 100% on room air in triage

## 2022-08-16 NOTE — Telephone Encounter (Addendum)
Transition Care Management Unsuccessful Follow-up Telephone Call  Date of discharge and from where:  [place] 08-15-22 Dx: Pneumonia due to COVID-19  Attempts:  1st Attempt  Reason for unsuccessful TCM follow-up call:  Left voice message   Juanda Crumble LPN [place] Direct Dial [phone]    Transition Care Management Unsuccessful Follow-up Telephone Call TCM DC [facility] 08-15-22 Dx: Pneumonia due to COVID-19  Date of discharge and from where:    Attempts:  2nd Attempt  Reason for unsuccessful TCM follow-up call:  Left voice message   Juanda Crumble LPN [place] Direct Dial [phone]  Transition Care Management Unsuccessful Follow-up Telephone Call  Date of discharge and from where:    TCM DC [facility] 08-15-22 Dx: Pneumonia due to COVID-19    Attempts:  3rd Attempt  Reason for unsuccessful TCM follow-up call:  Left voice message   Juanda Crumble LPN [place] Direct Dial [phone]

## 2022-08-16 NOTE — Telephone Encounter (Signed)
Gabapentin Last filled:  07/27/22, #450 Last OV:  06/15/22, L foot wound Next OV:  08/22/22:  hosp f/u

## 2022-08-17 ENCOUNTER — Observation Stay: Payer: Medicare Other

## 2022-08-17 ENCOUNTER — Encounter: Payer: Self-pay | Admitting: Family Medicine

## 2022-08-17 ENCOUNTER — Other Ambulatory Visit: Payer: Self-pay

## 2022-08-17 DIAGNOSIS — R079 Chest pain, unspecified: Secondary | ICD-10-CM | POA: Diagnosis not present

## 2022-08-17 DIAGNOSIS — U071 COVID-19: Secondary | ICD-10-CM

## 2022-08-17 DIAGNOSIS — R778 Other specified abnormalities of plasma proteins: Secondary | ICD-10-CM | POA: Diagnosis not present

## 2022-08-17 DIAGNOSIS — I959 Hypotension, unspecified: Secondary | ICD-10-CM

## 2022-08-17 DIAGNOSIS — G40909 Epilepsy, unspecified, not intractable, without status epilepticus: Secondary | ICD-10-CM

## 2022-08-17 DIAGNOSIS — I480 Paroxysmal atrial fibrillation: Secondary | ICD-10-CM | POA: Diagnosis not present

## 2022-08-17 DIAGNOSIS — I4891 Unspecified atrial fibrillation: Secondary | ICD-10-CM | POA: Insufficient documentation

## 2022-08-17 DIAGNOSIS — W19XXXA Unspecified fall, initial encounter: Secondary | ICD-10-CM

## 2022-08-17 LAB — COMPREHENSIVE METABOLIC PANEL
ALT: 41 U/L (ref 0–44)
AST: 44 U/L — ABNORMAL HIGH (ref 15–41)
Albumin: 2.9 g/dL — ABNORMAL LOW (ref 3.5–5.0)
Alkaline Phosphatase: 51 U/L (ref 38–126)
Anion gap: 9 (ref 5–15)
BUN: 25 mg/dL — ABNORMAL HIGH (ref 8–23)
CO2: 25 mmol/L (ref 22–32)
Calcium: 7.8 mg/dL — ABNORMAL LOW (ref 8.9–10.3)
Chloride: 105 mmol/L (ref 98–111)
Creatinine, Ser: 0.92 mg/dL (ref 0.61–1.24)
GFR, Estimated: >60 mL/min (ref >60)
Glucose, Bld: 123 mg/dL — ABNORMAL HIGH (ref 70–99)
Potassium: 3.9 mmol/L (ref 3.5–5.1)
Sodium: 139 mmol/L (ref 135–145)
Total Bilirubin: 1.3 mg/dL — ABNORMAL HIGH (ref 0.3–1.2)
Total Protein: 6.3 g/dL — ABNORMAL LOW (ref 6.5–8.1)

## 2022-08-17 LAB — BLOOD CULTURE ID PANEL (REFLEXED) - BCID2

## 2022-08-17 LAB — CBC WITH DIFFERENTIAL/PLATELET
Abs Immature Granulocytes: 0.09 10*3/uL — ABNORMAL HIGH (ref 0.00–0.07)
Basophils Absolute: 0 10*3/uL (ref 0.0–0.1)
Basophils Relative: 0 %
Eosinophils Absolute: 0 10*3/uL (ref 0.0–0.5)
Eosinophils Relative: 0 %
HCT: 39.5 % (ref 39.0–52.0)
Hemoglobin: 13.4 g/dL (ref 13.0–17.0)
Immature Granulocytes: 1 %
Lymphocytes Relative: 5 %
Lymphs Abs: 0.5 10*3/uL — ABNORMAL LOW (ref 0.7–4.0)
MCH: 31.9 pg (ref 26.0–34.0)
MCHC: 33.9 g/dL (ref 30.0–36.0)
MCV: 94 fL (ref 80.0–100.0)
Monocytes Absolute: 0.7 10*3/uL (ref 0.1–1.0)
Monocytes Relative: 7 %
Neutro Abs: 8.8 10*3/uL — ABNORMAL HIGH (ref 1.7–7.7)
Neutrophils Relative %: 87 %
Platelets: 168 10*3/uL (ref 150–400)
RBC: 4.2 MIL/uL — ABNORMAL LOW (ref 4.22–5.81)
RDW: 13.2 % (ref 11.5–15.5)
WBC: 10.1 10*3/uL (ref 4.0–10.5)
nRBC: 0 % (ref 0.0–0.2)

## 2022-08-17 LAB — URINALYSIS, COMPLETE (UACMP) WITH MICROSCOPIC
Bacteria, UA: NONE SEEN
Bilirubin Urine: NEGATIVE
Glucose, UA: NEGATIVE mg/dL
Ketones, ur: NEGATIVE mg/dL
Leukocytes,Ua: NEGATIVE
Nitrite: NEGATIVE
Protein, ur: NEGATIVE mg/dL
Specific Gravity, Urine: 1.01 (ref 1.005–1.030)
Squamous Epithelial / HPF: NONE SEEN (ref 0–5)
pH: 7 (ref 5.0–8.0)

## 2022-08-17 LAB — C-REACTIVE PROTEIN: CRP: 7 mg/dL — ABNORMAL HIGH (ref <1.0)

## 2022-08-17 LAB — PROTIME-INR
INR: 1.3 — ABNORMAL HIGH (ref 0.8–1.2)
Prothrombin Time: 15.6 seconds — ABNORMAL HIGH (ref 11.4–15.2)

## 2022-08-17 LAB — FERRITIN: Ferritin: 694 ng/mL — ABNORMAL HIGH (ref 24–336)

## 2022-08-17 LAB — APTT: aPTT: 30 seconds (ref 24–36)

## 2022-08-17 LAB — PROCALCITONIN: Procalcitonin: <0.1 ng/mL

## 2022-08-17 LAB — CK: Total CK: 450 U/L — ABNORMAL HIGH (ref 49–397)

## 2022-08-17 LAB — TROPONIN I (HIGH SENSITIVITY)
Troponin I (High Sensitivity): 48 ng/L — ABNORMAL HIGH (ref <18)
Troponin I (High Sensitivity): 44 ng/L — ABNORMAL HIGH (ref <18)

## 2022-08-17 LAB — LACTATE DEHYDROGENASE: LDH: 234 U/L — ABNORMAL HIGH (ref 98–192)

## 2022-08-17 MED ORDER — IOHEXOL 350 MG/ML SOLN
75.0000 mL | Freq: Once | INTRAVENOUS | Status: AC | PRN
  Administered 2022-08-17: 75 mL via INTRAVENOUS

## 2022-08-17 MED ORDER — GABAPENTIN 300 MG PO CAPS
600.0000 mg | ORAL_CAPSULE | Freq: Two times a day (BID) | ORAL | Status: DC
  Administered 2022-08-17 – 2022-08-25 (×16): 600 mg via ORAL
  Filled 2022-08-17 (×17): qty 2

## 2022-08-17 MED ORDER — CEFAZOLIN SODIUM-DEXTROSE 2-4 GM/100ML-% IV SOLN
2.0000 g | Freq: Three times a day (TID) | INTRAVENOUS | Status: DC
  Administered 2022-08-17 – 2022-08-25 (×23): 2 g via INTRAVENOUS
  Filled 2022-08-17 (×24): qty 100

## 2022-08-17 MED ORDER — HEPARIN (PORCINE) 25000 UT/250ML-% IV SOLN
1350.0000 [IU]/h | INTRAVENOUS | Status: DC
  Administered 2022-08-17 – 2022-08-20 (×5): 1350 [IU]/h via INTRAVENOUS
  Filled 2022-08-17 (×5): qty 250

## 2022-08-17 MED ORDER — GABAPENTIN 300 MG PO CAPS
300.0000 mg | ORAL_CAPSULE | Freq: Every day | ORAL | Status: DC
  Administered 2022-08-17 – 2022-08-25 (×8): 300 mg via ORAL
  Filled 2022-08-17 (×8): qty 1

## 2022-08-17 MED ORDER — HEPARIN BOLUS VIA INFUSION
4550.0000 [IU] | Freq: Once | INTRAVENOUS | Status: AC
  Administered 2022-08-17: 4550 [IU] via INTRAVENOUS
  Filled 2022-08-17: qty 4550

## 2022-08-17 MED ORDER — INFLUENZA VAC A&B SA ADJ QUAD 0.5 ML IM PRSY: 0.5000 mL | PREFILLED_SYRINGE | INTRAMUSCULAR | Status: DC

## 2022-08-17 NOTE — Consult Note (Signed)
ANTICOAGULATION CONSULT NOTE - Follow Up Consult  Pharmacy Consult for heparin gtt Indication: atrial fibrillation  Allergies  Allergen Reactions   Peanut-Containing Drug Products Anaphylaxis and Dermatitis   Yellow Jacket Venom [Bee Venom] Anaphylaxis and Other (See Comments)    Respiratory Distress   Celebrex [Celecoxib] Other (See Comments)    BLACK STOOL?MELENA?   Percocet [Oxycodone-Acetaminophen] Hives, Swelling and Other (See Comments)   Latex Rash   Nsaids Rash    Patient Measurements: Height: '5\' 8"'$  (172.7 cm) Weight: 106.6 kg (235 lb) IBW/kg (Calculated) : 68.4 Heparin Dosing Weight: 91.8 kg  Vital Signs: Temp: 98.4 F (36.9 C) (11/03 1943) Temp Source: Oral (11/03 1943) BP: 128/77 (11/03 1943) Pulse Rate: 62 (11/03 1943)  Labs: Recent Labs    08/16/22 2202 08/17/22 0105 08/17/22 0224 08/17/22 0623 08/17/22 1931  HGB 14.7  --   --  13.4  --   HCT 44.4  --   --  39.5  --   PLT 168  --   --  168  --   APTT  --   --   --   --  30  LABPROT  --   --   --   --  15.6*  INR  --   --   --   --  1.3*  CREATININE 1.16  --  0.92  --   --   CKTOTAL 450*  --   --   --   --   TROPONINIHS 56* 48* 44*  --   --      Estimated Creatinine Clearance: 87.2 mL/min (by C-G formula based on SCr of 0.92 mg/dL).   Medications:  +heparin gtt  Enoxaparin ppx prior to heparin consult No PTA anticoagulation  Assessment: 71 y.o. male with a hx of stroke, MI with nonobstructive coronary artery disease, questionable CHF, gastric ulcers, and obstructive sleep apnea who was evaluated for atrial fibrillation with rapid ventricular response and elevated troponin.  Cards recommends heparin gtt with transition to apixaban prior to discharge.  Baseline labs: aPTT 30, INR 1.3, Hgb 13.4, Plts 168    Goal of Therapy:  Heparin level 0.3-0.7 units/ml Monitor platelets by anticoagulation protocol: Yes   Plan:  Give 4550 units bolus x 1 Start heparin infusion at 1350 units/hr Check  anti-Xa level in 8 hours and daily while on heparin Continue to monitor H&H and platelets  Alison Murray 08/17/2022,10:37 PM

## 2022-08-17 NOTE — Progress Notes (Signed)
PHARMACIST - PHYSICIAN COMMUNICATION  CONCERNING:  Enoxaparin (Lovenox) for DVT Prophylaxis    RECOMMENDATION: Patient was prescribed enoxaprin '40mg'$  q24 hours for VTE prophylaxis.   There were no vitals filed for this visit.  There is no height or weight on file to calculate BMI.  Estimated Creatinine Clearance: 69.1 mL/min (by C-G formula based on SCr of 1.16 mg/dL).   Based on [place] patient is candidate for enoxaparin 0.'5mg'$ /kg TBW SQ every 24 hours based on BMI being >30.  DESCRIPTION: Pharmacy has adjusted enoxaparin dose per [facility] policy.  Patient is now receiving enoxaparin 0.5 mg/kg every 24 hours   Renda Rolls, PharmD, [facility] [place] 08/17/2022 1:26 AM

## 2022-08-17 NOTE — Hospital Course (Addendum)
Nicholas [name] is a 70, past medical history CAD/MI, GERD, PFO, remote history of seizure, questionable history of stroke/CHF.  Presented to the 08/16/2022 from home via EMS for fall, per EMS patient was hypotensive with systolic in 17C, tachycardic in the 140s, saturating 80% on room air. 11/02-11/03: In ED, initial BP 167, SPO2 100%), taper down to 2L [place], EKG A-fib RVR rate 117. Labs borderline potassium of 3.5 and a BUN of 27 with albumin 3.3, AST 65 and ALT 48.  Total CK was 450 and high sensitive troponin I was 56.  CBC showed leukocytosis of 13 with neutrophilia.  Blood cultures were drawn.  UA was negative.  COVID-positive.  CXR cardiomegaly with mild central congestion.  Treated with Zofran, 1 L normal saline.  Admitted to hospitalist service.  Cardiology consult placed by admitting hospitalist. 11/03: cardiology saw patient, recommended eval for PE - CTA Chest negative for PE, (+)mild pulmonary edema, RLL possible pneumonia / airway thickening, CAD and aortic atherosclerosis, cardiomegaly. Remains weak, on O2.  11/04: (+)BCx x2 for MSSA, started on Cefazolin IV, repeat BCx tomorrow, plan for ID f/u Monday (today is Saturday). Finished Paxlovid. Work on weaning off O2. Echo pending.  11/05: pt reports migraine, typically takes depakote for prevention which he has been getting. Hypertensive. Added labetalol IV prn, ok for phenergan for migraine. See A/P.  11/06: VSS, migraine resolved. ID to see today.   Consultants:  Cardiology   Procedures: none      ASSESSMENT & PLAN:   Principal Problem:   Generalized weakness Active Problems:   Chronic diastolic CHF (congestive heart failure) (HCC)   Hypothyroidism   Dyslipidemia   Seizure disorder (HCC)   Atrial fibrillation with rapid ventricular response ([place])   Fall   COVID-19   COVID-19 w/ acute hypoxic respiratory failure and pneumonia Supplemental O2 Completed Paxlovid Steroids   Atrial Fibrillation w/ RVR CHADS2VASc 3-4 (age x  1, ?CHF, CVA x 2)  Cardiology following Rate control w/ metoprolol Heparin gtt per cardiology to transition to [place] Echo done, pending read  If ventricular rates remain well controlled, would favor DCCV in the outpatient setting after he has been adequately anticoagulated, without interruption, for at least 4 weeks, and after he has recovered from his acute illness   Hypotension Resolve s/p IV fluids   Elevated high sensitivity troponin: Likely demand ischemia in the setting of recent Covid infection and Afib with RVR LHC in 2019 showed no significant CAD Cardiology following  Echo pending, if preserved LVSF no plans for inpatient ischemic evaluation Consider outpatient coronary CTA  Bacteremia likely MSSA (+)BCx x2 started on Cefazolin IV 08/18/22  repeat BCx 11/05 plan for ID f/u Dr Delaine Lame Monday 11/6 Echo already done, pending read  Will need TEE   Migraine Continue depakote ppx NSAID allergy - can't have this HTN - can't safely given triptan until BP improved Already on steroids  Ordered phenergan Consider Fioricet   Generalized weakness and fall PT/OT   Hypothyroidism Synthroid.   Seizure disorder ([place]) Depakote DR.   Dyslipidemia hold off statin therapy given elevated CK level which could be related to mild rhabdomyolysis vs COVID inflammation   DVT prophylaxis: Heparin --> DOAC planned  Pertinent IV fluids/nutrition: stopped IV NS today  Central lines / invasive devices: none  Code Status: FULL CODE Family Communication: pt declined call to support persons today   Disposition: inpatient TOC needs: will need SNF  Barriers to discharge / significant pending items: bacteremia

## 2022-08-17 NOTE — Consult Note (Signed)
PHARMACY - PHYSICIAN COMMUNICATION CRITICAL VALUE ALERT - BLOOD CULTURE IDENTIFICATION (BCID)  Nicholas [name] is an 71 y.o. male w/ h/o stroke, MINOCA, questionable CHF, gastric ulcers, and OSA  presenting with Afib w RVR, tropinemia ISO covid pneumonia. Pt started on Paxlovid 10/29, Blood cultures collected on 11/03 resulted MSSA per BCID. Pharmacy consulted to dose ancef.  Assessment:  Blood culture with 1of4 vials (aero only) with GPC >> Staph aureus (no resistances detected).   Name of physician (or Provider) Contacted: Sharion Settler NP  Current antibiotics: No antibiotics at time of result (only on Paxlovid currently)  Changes to prescribed antibiotics recommended:  Add on Ancef per d/w MD (rec accepted / order entered.)  Results for orders placed or performed during the hospital encounter of 08/16/22  Blood Culture ID Panel (Reflexed) (Collected: 08/17/2022  2:54 AM)  Result Value Ref Range   Enterococcus faecalis NOT DETECTED NOT DETECTED   Enterococcus Faecium NOT DETECTED NOT DETECTED   Listeria monocytogenes NOT DETECTED NOT DETECTED   Staphylococcus species DETECTED (A) NOT DETECTED   Staphylococcus aureus (BCID) DETECTED (A) NOT DETECTED   Staphylococcus epidermidis NOT DETECTED NOT DETECTED   Staphylococcus lugdunensis NOT DETECTED NOT DETECTED   Streptococcus species NOT DETECTED NOT DETECTED   Streptococcus agalactiae NOT DETECTED NOT DETECTED   Streptococcus pneumoniae NOT DETECTED NOT DETECTED   Streptococcus pyogenes NOT DETECTED NOT DETECTED   A.calcoaceticus-baumannii NOT DETECTED NOT DETECTED   Bacteroides fragilis NOT DETECTED NOT DETECTED   Enterobacterales NOT DETECTED NOT DETECTED   Enterobacter cloacae complex NOT DETECTED NOT DETECTED   Escherichia coli NOT DETECTED NOT DETECTED   Klebsiella aerogenes NOT DETECTED NOT DETECTED   Klebsiella oxytoca NOT DETECTED NOT DETECTED   Klebsiella pneumoniae NOT DETECTED NOT DETECTED   Proteus species NOT DETECTED  NOT DETECTED   Salmonella species NOT DETECTED NOT DETECTED   Serratia marcescens NOT DETECTED NOT DETECTED   Haemophilus influenzae NOT DETECTED NOT DETECTED   Neisseria meningitidis NOT DETECTED NOT DETECTED   Pseudomonas aeruginosa NOT DETECTED NOT DETECTED   Stenotrophomonas maltophilia NOT DETECTED NOT DETECTED   Candida albicans NOT DETECTED NOT DETECTED   Candida auris NOT DETECTED NOT DETECTED   Candida glabrata NOT DETECTED NOT DETECTED   Candida krusei NOT DETECTED NOT DETECTED   Candida parapsilosis NOT DETECTED NOT DETECTED   Candida tropicalis NOT DETECTED NOT DETECTED   Cryptococcus neoformans/gattii NOT DETECTED NOT DETECTED   Meth resistant mecA/C and MREJ NOT DETECTED NOT DETECTED    Shanon Brow Zekiah Caruth 08/17/2022  7:45 PM

## 2022-08-17 NOTE — Assessment & Plan Note (Signed)
-   We will continue Depakote DR.

## 2022-08-17 NOTE — ED Notes (Signed)
ED TO INPATIENT HANDOFF REPORT  ED Nurse Name and Phone #: Baxter Flattery, RN  S Name/Age/Gender Nicholas [name] 71 y.o. male Room/Bed: ED33A/ED33A  Code Status   Code Status: Full Code  Home/SNF/Other Home Patient oriented to: self, place, time, and situation Is this baseline? Yes   Triage Complete: Triage complete  Chief Complaint Generalized weakness [R53.1]  Triage Note Pt bib ems for a fall. Pt c/o R sided cp, bruising notable to L side of chest. Denies head injury and blood thinners.   Pt bib ems for a fall. Pt c/o R sided cp, bruising notable to L side of chest. Denies head injury and blood thinners. Pt was hypotensive with systolic in the 63'J, tachy in the 140's, and 80% on RA. Pt is 100% on room air in triage    Allergies Allergies  Allergen Reactions   Peanut-Containing Drug Products Anaphylaxis and Dermatitis   Yellow Jacket Venom [Bee Venom] Anaphylaxis and Other (See Comments)    Respiratory Distress   Celebrex [Celecoxib] Other (See Comments)    BLACK STOOL?MELENA?   Percocet [Oxycodone-Acetaminophen] Hives, Swelling and Other (See Comments)   Latex Rash   Nsaids Rash    Level of Care/Admitting Diagnosis ED Disposition     ED Disposition  Admit   Condition  --   [place]: [place] [[id number]]  Level of Care: Telemetry Cardiac [[id number]]  Covid Evaluation: Asymptomatic - no recent exposure (last 10 days) testing not required  Diagnosis: Generalized weakness [[id number]]  Admitting Physician: Christel Mormon [[id number]]  Attending Physician: Christel Mormon [[id number]]          B Medical/Surgery History Past Medical History:  Diagnosis Date   Acute kidney failure 08/2008   "cleared up"  no problems since   Allergy    Anxiety    Arthritis    Asthma    ?of this no inhaler   Blood transfusion    CHF (congestive heart failure) ([place])    ?of this, pt denies   CLOSTRIDIUM DIFFICILE COLITIS 07/04/2010   Annotation: 12/09, 2/10  Qualifier: Diagnosis of  By: [name], [name]     Depression with anxiety    Duodenitis determined by biopsy 02/2016   peptic likely due to aleve (erosive gastropathy with duodenal erosions)   ECZEMA 07/04/2010   Qualifier: Diagnosis of  By: [name], [name]     Elevated liver enzymes    Fatty liver    Full dentures    GERD (gastroesophageal reflux disease)    Heart attack ([place])    08/2008 (likely demand ischemia in the setting of MSSA sepsis/R TKA  infection)10-2008   History of hiatal hernia    History of stomach ulcers    Hypothyroidism    Lower GI bleeding    MALAR AND MAXILLARY BONES CLOSED FRACTURE 07/04/2010   Annotation: ORIF Qualifier: Diagnosis of  By: [name], [name]     Migraine    "definitely"   MRSA (methicillin resistant Staphylococcus aureus)    in leg, had to place steel rod in leg   Personal history of colonic adenoma 06/01/2003   PFO (patent foramen ovale)    small PFO by 08/2008 TEE   Pneumonia 08/2008   "while in ICU"   Seizures ([place]) 2009   "long time ago"     Sleep apnea    does not wear CPAP   Stroke ([place]) 2010   unable to complete sentences at times   Wears glasses  Past Surgical History:  Procedure Laterality Date   AMPUTATION Left 01/24/2022   Procedure: LEFT GREAT TOE AMPUTATION AT METATARSOPHALANGEAL JOINT AND SECOND TOE AMPUTATION;  Surgeon: Newt Minion, MD;  Location: [facility];  Service: Orthopedics;  Laterality: Left;   anterior  nerve transposition  07/2009   left ulnar nerve   antibiotic spacer exchange  11/2008; 08/2006   right knee   ARTHROTOMY  08/2008   right knee w/I&D   CARDIOVASCULAR STRESS TEST  2013   stress EKG - negative for ischemia, 4 min 7.3 METs, normal blood pressure response   CATARACT EXTRACTION     COLONOSCOPY  11/2012   diverticulosis, rpt 10 yrs [name])   ESOPHAGOGASTRODUODENOSCOPY  02/2016   erosive gastropathy with duodenal erosions [name])   HARDWARE REMOVAL  04/2006   right knee w/antibiotic spacers  placed   INGUINAL HERNIA REPAIR  early 1990's   bilateral   JOINT REPLACEMENT Bilateral    KNEE ARTHROSCOPY  10/2001   right   KNEE FUSION  03/2009   right knee removal; antibiotic spacers;    LEFT HEART CATH AND CORONARY ANGIOGRAPHY N/A 02/04/2018    WNL (Patwardhan, Reynold Bowen, MD)   LUMBAR LAMINECTOMY/DECOMPRESSION MICRODISCECTOMY N/A 12/05/2018   Procedure: LUMBAR THREE TO FOUR AND LUMBAR FOUR TO FIVE DECOMPRESSION;  Surgeon: [name], MD;  Location: [facility];  Service: Orthopedics;  Laterality: N/A;   MULTIPLE TOOTH EXTRACTIONS     REPLACEMENT TOTAL KNEE  08/2008; 09/2001   right   REVERSE SHOULDER ARTHROPLASTY Left 09/03/2017   Procedure: REVERSE SHOULDER ARTHROPLASTY;  Surgeon: Meredith Pel, MD;  Location: [facility];  Service: Orthopedics;  Laterality: Left;   REVERSE SHOULDER ARTHROPLASTY Right 05/13/2018   Procedure: RIGHT REVERSE SHOULDER ARTHROPLASTY;  Surgeon: Meredith Pel, MD;  Location: [facility];  Service: Orthopedics;  Laterality: Right;   rod placement Right 03/2009   R knee   SYNOVECTOMY  06/2005   debridement, liner exchange right knee   TOE AMPUTATION Left 08/2008   great toe - osteomyelitis (staph infection)   TOE AMPUTATION Left 04/2016   2nd toe [name]   TONSILLECTOMY     TOTAL KNEE ARTHROPLASTY Left 01/15/2017   TOTAL KNEE ARTHROPLASTY Left 01/15/2017   Procedure: TOTAL KNEE ARTHROPLASTY;  Surgeon: Meredith Pel, MD;  Location: [facility];  Service: Orthopedics;  Laterality: Left;     A IV Location/Drains/Wounds Patient Lines/Drains/Airways Status     Active Line/Drains/Airways     Name Placement date Placement time Site Days   Peripheral IV 08/16/22 22 G Left;Posterior Hand 08/16/22  2151  Hand  1   Peripheral IV 08/17/22 20 G 1" Posterior;Proximal;Right Forearm 08/17/22  0000  Forearm  less than 1   Incision (Closed) 05/13/18 Shoulder Right 05/13/18  1619  -- 1557   Incision (Closed) 12/05/18 Back Other (Comment) 12/05/18  1211  -- 1351   Incision  (Closed) 01/24/22 Foot Left 01/24/22  0827  -- 205            Intake/Output Last 24 hours No intake or output data in the 24 hours ending 08/17/22 1507  Labs/Imaging Results for orders placed or performed during the hospital encounter of 08/16/22 (from the past 48 hour(s))  CBC with Differential     Status: Abnormal   Collection Time: 08/16/22 10:02 PM  Result Value Ref Range   WBC 13.0 (H) 4.0 - 10.5 K/uL   RBC 4.63 4.22 - 5.81 MIL/uL   Hemoglobin 14.7 13.0 - 17.0 g/dL  HCT 44.4 39.0 - 52.0 %   MCV 95.9 80.0 - 100.0 fL   MCH 31.7 26.0 - 34.0 pg   MCHC 33.1 30.0 - 36.0 g/dL   RDW 13.2 11.5 - 15.5 %   Platelets 168 150 - 400 K/uL   nRBC 0.0 0.0 - 0.2 %   Neutrophils Relative % 84 %   Neutro Abs 10.9 (H) 1.7 - 7.7 K/uL   Lymphocytes Relative 3 %   Lymphs Abs 0.4 (L) 0.7 - 4.0 K/uL   Monocytes Relative 12 %   Monocytes Absolute 1.6 (H) 0.1 - 1.0 K/uL   Eosinophils Relative 0 %   Eosinophils Absolute 0.0 0.0 - 0.5 K/uL   Basophils Relative 0 %   Basophils Absolute 0.0 0.0 - 0.1 K/uL   Immature Granulocytes 1 %   Abs Immature Granulocytes 0.14 (H) 0.00 - 0.07 K/uL    Comment: Performed at [facility], [address] [place], [place] [address]  Comprehensive metabolic panel     Status: Abnormal   Collection Time: 08/16/22 10:02 PM  Result Value Ref Range   Sodium 138 135 - 145 mmol/L   Potassium 3.5 3.5 - 5.1 mmol/L   Chloride 102 98 - 111 mmol/L   CO2 22 22 - 32 mmol/L   Glucose, Bld 125 (H) 70 - 99 mg/dL    Comment: Glucose reference range applies only to samples taken after fasting for at least 8 hours.   BUN 27 (H) 8 - 23 mg/dL   Creatinine, Ser 1.16 0.61 - 1.24 mg/dL   Calcium 8.1 (L) 8.9 - 10.3 mg/dL   Total Protein 7.0 6.5 - 8.1 g/dL   Albumin 3.3 (L) 3.5 - 5.0 g/dL   AST 65 (H) 15 - 41 U/L   ALT 48 (H) 0 - 44 U/L   Alkaline Phosphatase 56 38 - 126 U/L   Total Bilirubin 1.5 (H) 0.3 - 1.2 mg/dL   GFR, Estimated >60 >60 mL/min    Comment:  (NOTE) Calculated using the CKD-EPI Creatinine Equation (2021)    Anion gap 14 5 - 15    Comment: Performed at [facility], [address], [place], [place] [address]  Troponin I (High Sensitivity)     Status: Abnormal   Collection Time: 08/16/22 10:02 PM  Result Value Ref Range   Troponin I (High Sensitivity) 56 (H) <18 ng/L    Comment: (NOTE) Elevated high sensitivity troponin I (hsTnI) values and significant  changes across serial measurements may suggest ACS but many other  chronic and acute conditions are known to elevate hsTnI results.  Refer to the "Links" section for chest pain algorithms and additional  guidance. Performed at [facility], [address] [place], [place] [address]   Blood culture (routine x 2)     Status: None (Preliminary result)   Collection Time: 08/16/22 10:02 PM   Specimen: BLOOD  Result Value Ref Range   Specimen Description BLOOD LEFT HAND    Special Requests      BOTTLES DRAWN AEROBIC AND ANAEROBIC Blood Culture results may not be optimal due to an inadequate volume of blood received in culture bottles   Culture      NO GROWTH < 12 HOURS Performed at [facility], [address] [place],  [address]    Report Status PENDING   CK     Status: Abnormal   Collection Time: 08/16/22 10:02 PM  Result Value Ref Range   Total CK 450 (H)  49 - 397 U/L    Comment: Performed at [facility], [address] [place], [place] [address]  Urinalysis, Complete w Microscopic     Status: Abnormal   Collection Time: 08/17/22  1:05 AM  Result Value Ref Range   Color, Urine YELLOW (A) YELLOW   APPearance CLEAR (A) CLEAR   Specific Gravity, Urine 1.010 1.005 - 1.030   pH 7.0 5.0 - 8.0   Glucose, UA NEGATIVE NEGATIVE mg/dL   Hgb urine dipstick SMALL (A) NEGATIVE   Bilirubin Urine NEGATIVE NEGATIVE   Ketones, ur NEGATIVE NEGATIVE mg/dL   Protein, ur NEGATIVE NEGATIVE mg/dL   Nitrite NEGATIVE NEGATIVE    Leukocytes,Ua NEGATIVE NEGATIVE   WBC, UA 0-5 0 - 5 WBC/hpf   Bacteria, UA NONE SEEN NONE SEEN   Squamous Epithelial / LPF NONE SEEN 0 - 5   Mucus PRESENT    Hyaline Casts, UA PRESENT     Comment: Performed at [facility], [address] [place], [place] [address]  Troponin I (High Sensitivity)     Status: Abnormal   Collection Time: 08/17/22  1:05 AM  Result Value Ref Range   Troponin I (High Sensitivity) 48 (H) <18 ng/L    Comment: (NOTE) Elevated high sensitivity troponin I (hsTnI) values and significant  changes across serial measurements may suggest ACS but many other  chronic and acute conditions are known to elevate hsTnI results.  Refer to the "Links" section for chest pain algorithms and additional  guidance. Performed at [facility], [place], [place] [address]   Lactate dehydrogenase     Status: Abnormal   Collection Time: 08/17/22  2:23 AM  Result Value Ref Range   LDH 234 (H) 98 - 192 U/L    Comment: Performed at [facility], [address] [place], [place] [address]  Procalcitonin     Status: None   Collection Time: 08/17/22  2:23 AM  Result Value Ref Range   Procalcitonin <0.10 ng/mL    Comment:        Interpretation: PCT (Procalcitonin) <= 0.5 ng/mL: Systemic infection (sepsis) is not likely. Local bacterial infection is possible. (NOTE)       Sepsis PCT Algorithm           Lower Respiratory Tract                                      Infection PCT Algorithm    ----------------------------     ----------------------------         PCT < 0.25 ng/mL                PCT < 0.10 ng/mL          Strongly encourage             Strongly discourage   discontinuation of antibiotics    initiation of antibiotics    ----------------------------     -----------------------------       PCT 0.25 - 0.50 ng/mL            PCT 0.10 - 0.25 ng/mL               OR       >80% decrease in PCT            Discourage initiation of  antibiotics      Encourage discontinuation           of antibiotics    ----------------------------     -----------------------------         PCT >= 0.50 ng/mL              PCT 0.26 - 0.50 ng/mL               AND        <80% decrease in PCT             Encourage initiation of                                             antibiotics       Encourage continuation           of antibiotics    ----------------------------     -----------------------------        PCT >= 0.50 ng/mL                  PCT > 0.50 ng/mL               AND         increase in PCT                  Strongly encourage                                      initiation of antibiotics    Strongly encourage escalation           of antibiotics                                     -----------------------------                                           PCT <= 0.25 ng/mL                                                 OR                                        > 80% decrease in PCT                                      Discontinue / Do not initiate                                             antibiotics  Performed at [facility], [address] [place], [place] [address]   Comprehensive metabolic panel     Status: Abnormal   Collection Time:  08/17/22  2:24 AM  Result Value Ref Range   Sodium 139 135 - 145 mmol/L   Potassium 3.9 3.5 - 5.1 mmol/L   Chloride 105 98 - 111 mmol/L   CO2 25 22 - 32 mmol/L   Glucose, Bld 123 (H) 70 - 99 mg/dL    Comment: Glucose reference range applies only to samples taken after fasting for at least 8 hours.   BUN 25 (H) 8 - 23 mg/dL   Creatinine, Ser 0.92 0.61 - 1.24 mg/dL   Calcium 7.8 (L) 8.9 - 10.3 mg/dL   Total Protein 6.3 (L) 6.5 - 8.1 g/dL   Albumin 2.9 (L) 3.5 - 5.0 g/dL   AST 44 (H) 15 - 41 U/L   ALT 41 0 - 44 U/L   Alkaline Phosphatase 51 38 - 126 U/L   Total Bilirubin 1.3 (H) 0.3 - 1.2 mg/dL   GFR, Estimated >60 >60 mL/min     Comment: (NOTE) Calculated using the CKD-EPI Creatinine Equation (2021)    Anion gap 9 5 - 15    Comment: Performed at [facility], [address] [place], [place] [address]  C-reactive protein     Status: Abnormal   Collection Time: 08/17/22  2:24 AM  Result Value Ref Range   CRP 7.0 (H) <1.0 mg/dL    Comment: Performed at [facility] [address], [place], [place] [address]  Ferritin     Status: Abnormal   Collection Time: 08/17/22  2:24 AM  Result Value Ref Range   Ferritin 694 (H) 24 - 336 ng/mL    Comment: Performed at [facility], [place], [place] [address]  Troponin I (High Sensitivity)     Status: Abnormal   Collection Time: 08/17/22  2:24 AM  Result Value Ref Range   Troponin I (High Sensitivity) 44 (H) <18 ng/L    Comment: (NOTE) Elevated high sensitivity troponin I (hsTnI) values and significant  changes across serial measurements may suggest ACS but many other  chronic and acute conditions are known to elevate hsTnI results.  Refer to the "Links" section for chest pain algorithms and additional  guidance. Performed at [facility], [address] [place], [place] [address]   Blood culture (routine x 2)     Status: None (Preliminary result)   Collection Time: 08/17/22  2:54 AM   Specimen: BLOOD RIGHT ARM  Result Value Ref Range   Specimen Description BLOOD RIGHT ARM    Special Requests      BOTTLES DRAWN AEROBIC AND ANAEROBIC Blood Culture results may not be optimal due to an inadequate volume of blood received in culture bottles   Culture      NO GROWTH < 12 HOURS Performed at [facility], [address] [place], [place] [address]    Report Status PENDING   CBC with Differential/Platelet     Status: Abnormal   Collection Time: 08/17/22  6:23 AM  Result Value Ref Range   WBC 10.1 4.0 - 10.5 K/uL   RBC 4.20 (L) 4.22 - 5.81 MIL/uL   Hemoglobin 13.4 13.0 - 17.0 g/dL   HCT 39.5 39.0 - 52.0 %    MCV 94.0 80.0 - 100.0 fL   MCH 31.9 26.0 - 34.0 pg   MCHC 33.9 30.0 - 36.0 g/dL   RDW 13.2 11.5 - 15.5 %   Platelets 168 150 - 400 K/uL   nRBC 0.0 0.0 - 0.2 %   Neutrophils Relative %  87 %   Neutro Abs 8.8 (H) 1.7 - 7.7 K/uL   Lymphocytes Relative 5 %   Lymphs Abs 0.5 (L) 0.7 - 4.0 K/uL   Monocytes Relative 7 %   Monocytes Absolute 0.7 0.1 - 1.0 K/uL   Eosinophils Relative 0 %   Eosinophils Absolute 0.0 0.0 - 0.5 K/uL   Basophils Relative 0 %   Basophils Absolute 0.0 0.0 - 0.1 K/uL   Immature Granulocytes 1 %   Abs Immature Granulocytes 0.09 (H) 0.00 - 0.07 K/uL    Comment: Performed at [facility], [address] [place], [place] [address]   DG Chest Portable 1 View  Result Date: 08/16/2022 CLINICAL DATA:  Shortness of breath EXAM: PORTABLE CHEST 1 VIEW COMPARISON:  08/12/2022 FINDINGS: Bilateral shoulder replacements. Cardiomegaly with central congestion. Patchy atelectasis left base. No pleural effusion or pneumothorax. IMPRESSION: Cardiomegaly with mild central congestion, overall appearance improved since 08/12/2022. Patchy atelectasis left base. Electronically Signed   By: Donavan Foil M.D.   On: 08/16/2022 22:26    Pending Labs Unresulted Labs (From admission, onward)     Start     Ordered   08/17/22 0500  CBC with Differential/Platelet  Daily at 5am,   R      08/16/22 2348   08/17/22 0500  Comprehensive metabolic panel  Daily at 5am,   R      08/16/22 2348   08/17/22 0500  C-reactive protein  Daily at 5am,   R      08/16/22 2348   08/17/22 0500  Ferritin  Daily at 5am,   R      08/16/22 2348            Vitals/Pain Today's Vitals   08/17/22 1230 08/17/22 1300 08/17/22 1400 08/17/22 1415  BP: 134/84 128/76 127/65   Pulse: (!) 44 (!) 52 72 (!) 50  Resp: '15 20 20 20  '$ Temp:      TempSrc:      SpO2: 94% 95% 96% 92%  Weight:      Height:      PainSc:        Isolation Precautions Airborne and Contact precautions  Medications Medications   nirmatrelvir/ritonavir EUA (renal dosing) (PAXLOVID) 2 tablet (2 tablets Oral Given 08/17/22 0946)  EPINEPHrine (EPI-PEN) injection 0.3 mg (has no administration in time range)  furosemide (LASIX) tablet 20 mg (20 mg Oral Given 08/17/22 0938)  metoprolol succinate (TOPROL-XL) 24 hr tablet 100 mg (100 mg Oral Given 08/17/22 0938)  nitroGLYCERIN (NITROSTAT) SL tablet 0.4 mg (has no administration in time range)  levothyroxine (SYNTHROID) tablet 112 mcg (112 mcg Oral Given 08/17/22 1047)  pantoprazole (PROTONIX) EC tablet 40 mg (40 mg Oral Given 08/17/22 0936)  tamsulosin (FLOMAX) capsule 0.4 mg (0.4 mg Oral Given 08/17/22 0936)  ferrous sulfate tablet 325 mg (325 mg Oral Given 08/17/22 0816)  divalproex (DEPAKOTE ER) 24 hr tablet 500 mg (500 mg Oral Given 08/17/22 0945)  methocarbamol (ROBAXIN) tablet 500 mg (has no administration in time range)  tiZANidine (ZANAFLEX) tablet 4 mg (has no administration in time range)  B-complex with vitamin C tablet 1 tablet (has no administration in time range)  cholecalciferol (VITAMIN D3) tablet 1,000 Units (1,000 Units Oral Given 08/17/22 0939)  loratadine (CLARITIN) tablet 10 mg (10 mg Oral Given 08/17/22 0936)  fluticasone (FLONASE) 50 MCG/ACT nasal spray 2 spray (2 sprays Each Nare Given 08/17/22 1048)  enoxaparin (LOVENOX) injection 52.5 mg (52.5 mg Subcutaneous Given 08/17/22 0816)  0.9 %  NaCl with KCl 20 mEq/ L  infusion ( Intravenous New Bag/Given 08/17/22 1123)  methylPREDNISolone sodium succinate (SOLU-MEDROL) 125 mg/2 mL injection 53.125 mg (53.125 mg Intravenous Given 08/17/22 1117)    Followed by  predniSONE (DELTASONE) tablet 50 mg (has no administration in time range)  traZODone (DESYREL) tablet 25 mg (has no administration in time range)  magnesium hydroxide (MILK OF MAGNESIA) suspension 30 mL (has no administration in time range)  ondansetron (ZOFRAN) tablet 4 mg (has no administration in time range)    Or  ondansetron (ZOFRAN) injection 4 mg (has no  administration in time range)  ascorbic acid (VITAMIN C) tablet 500 mg (500 mg Oral Given 08/17/22 0938)  zinc sulfate capsule 220 mg (220 mg Oral Given 08/17/22 0936)  guaiFENesin-dextromethorphan (ROBITUSSIN DM) 100-10 MG/5ML syrup 10 mL (has no administration in time range)  chlorpheniramine-HYDROcodone (TUSSIONEX) 10-8 MG/5ML suspension 5 mL (has no administration in time range)  gabapentin (NEURONTIN) capsule 300 mg (300 mg Oral Given 08/17/22 0816)    And  gabapentin (NEURONTIN) capsule 600 mg (600 mg Oral Given 08/17/22 1116)  sodium chloride 0.9 % bolus 1,000 mL (0 mLs Intravenous Stopped 08/17/22 0104)  ondansetron (ZOFRAN) injection 4 mg (4 mg Intravenous Given 08/16/22 2313)  iohexol (OMNIPAQUE) 350 MG/ML injection 75 mL (75 mLs Intravenous Contrast Given 08/17/22 1457)    Mobility walks Moderate fall risk   Focused Assessments Pulmonary Assessment Handoff:  Lung sounds:   On Room Air     R Recommendations: See Admitting Provider Note  Report given to:   Additional Notes:

## 2022-08-17 NOTE — ED Notes (Signed)
Breakfast tray set up for pt.  ?

## 2022-08-17 NOTE — Consult Note (Signed)
Cardiology Consultation   Patient ID: Nicholas [name] MRN: [id number]; DOB: [birth year]  Admit date: 08/16/2022 Date of Consult: 08/17/2022  PCP:  Nicholas Boyden, MD   [facility] Providers Cardiologist:  Nicholas [name]     Patient Profile:   Nicholas [name] is a 71 y.o. male with a hx of stroke, MI with nonobstructive coronary artery disease, questionable CHF, gastric ulcers, and obstructive sleep apnea who is being seen 08/17/2022 for the evaluation of atrial fibrillation with rapid ventricular response and elevated troponin at the request of Dr. Arville Care.  History of Present Illness:   Nicholas [name] was hospitalized last week with COVID-19 pneumonia and was discharged home on Paxlovid.  Since returning home, he has experienced some generalized weakness.  He tried to take a shower yesterday evening and lost his balance, leading to a fall between the tub and the commode.  That was his second fall since returning home from the hospital.  He notes some cough but no chest pain or significant dyspnea.  He also denies palpitations and edema.  He has been lightheaded off and on for several days.  He presented via EMS to the ED and initially complained of right-sided chest pain.  He was also noted to have some bruising on the left side of the chest.  He was hypotensive with systolic blood pressures in the 80s.  He was also tachycardic into the 140s and hypoxic (80% on room air) when first assessed by EMS.  In the ED, Nicholas [name] was found to be in atrial fibrillation (he reports that he was told about atrial fibrillation in the past but does not recall specifics).  He does not take any anticoagulation but notes that he may have been on a blood thinner many years ago.   Past Medical History:  Diagnosis Date   Acute kidney failure 08/2008   "cleared up"  no problems since   Allergy    Anxiety    Arthritis    Asthma    ?of this no inhaler   Blood transfusion    CHF (congestive heart failure) (HCC)     ?of this, pt denies   CLOSTRIDIUM DIFFICILE COLITIS 07/04/2010   Annotation: 12/09, 2/10 Qualifier: Diagnosis of  By: [name], [name]     Depression with anxiety    Duodenitis determined by biopsy 02/2016   peptic likely due to aleve (erosive gastropathy with duodenal erosions)   ECZEMA 07/04/2010   Qualifier: Diagnosis of  By: [name], [name]     Elevated liver enzymes    Fatty liver    Full dentures    GERD (gastroesophageal reflux disease)    Heart attack (HCC)    08/2008 (likely demand ischemia in the setting of MSSA sepsis/R TKA  infection)10-2008   History of hiatal hernia    History of stomach ulcers    Hypothyroidism    Lower GI bleeding    MALAR AND MAXILLARY BONES CLOSED FRACTURE 07/04/2010   Annotation: ORIF Qualifier: Diagnosis of  By: [name], [name]     Migraine    "definitely"   MRSA (methicillin resistant Staphylococcus aureus)    in leg, had to place steel rod in leg   Personal history of colonic adenoma 06/01/2003   PFO (patent foramen ovale)    small PFO by 08/2008 TEE   Pneumonia 08/2008   "while in ICU"   Seizures (HCC) 2009   "long time ago"     Sleep apnea    does not  wear CPAP   Stroke (HCC) 2010   unable to complete sentences at times   Wears glasses     Past Surgical History:  Procedure Laterality Date   AMPUTATION Left 01/24/2022   Procedure: LEFT GREAT TOE AMPUTATION AT METATARSOPHALANGEAL JOINT AND SECOND TOE AMPUTATION;  Surgeon: [name], MD;  Location: [facility] OR;  Service: Orthopedics;  Laterality: Left;   anterior  nerve transposition  07/2009   left ulnar nerve   antibiotic spacer exchange  11/2008; 08/2006   right knee   ARTHROTOMY  08/2008   right knee w/I&D   CARDIOVASCULAR STRESS TEST  2013   stress EKG - negative for ischemia, 4 min 7.3 METs, normal blood pressure response   CATARACT EXTRACTION     COLONOSCOPY  11/2012   diverticulosis, rpt 10 yrs [name])   ESOPHAGOGASTRODUODENOSCOPY  02/2016   erosive gastropathy with  duodenal erosions [name])   HARDWARE REMOVAL  04/2006   right knee w/antibiotic spacers placed   INGUINAL HERNIA REPAIR  early 1990's   bilateral   JOINT REPLACEMENT Bilateral    KNEE ARTHROSCOPY  10/2001   right   KNEE FUSION  03/2009   right knee removal; antibiotic spacers;    LEFT HEART CATH AND CORONARY ANGIOGRAPHY N/A 02/04/2018    WNL (Patwardhan, [name], MD)   LUMBAR LAMINECTOMY/DECOMPRESSION MICRODISCECTOMY N/A 12/05/2018   Procedure: LUMBAR THREE TO FOUR AND LUMBAR FOUR TO FIVE DECOMPRESSION;  Surgeon: [name], MD;  Location: [facility] OR;  Service: Orthopedics;  Laterality: N/A;   MULTIPLE TOOTH EXTRACTIONS     REPLACEMENT TOTAL KNEE  08/2008; 09/2001   right   REVERSE SHOULDER ARTHROPLASTY Left 09/03/2017   Procedure: REVERSE SHOULDER ARTHROPLASTY;  Surgeon: [name], MD;  Location: [facility] OR;  Service: Orthopedics;  Laterality: Left;   REVERSE SHOULDER ARTHROPLASTY Right 05/13/2018   Procedure: RIGHT REVERSE SHOULDER ARTHROPLASTY;  Surgeon: [name], MD;  Location: [facility] OR;  Service: Orthopedics;  Laterality: Right;   rod placement Right 03/2009   R knee   SYNOVECTOMY  06/2005   debridement, liner exchange right knee   TOE AMPUTATION Left 08/2008   great toe - osteomyelitis (staph infection)   TOE AMPUTATION Left 04/2016   2nd toe August Saucer)   TONSILLECTOMY     TOTAL KNEE ARTHROPLASTY Left 01/15/2017   TOTAL KNEE ARTHROPLASTY Left 01/15/2017   Procedure: TOTAL KNEE ARTHROPLASTY;  Surgeon: [name], MD;  Location: [facility] OR;  Service: Orthopedics;  Laterality: Left;     Home Medications:  Prior to Admission medications   Medication Sig Start Date Zenon Leaf Date Taking? Authorizing Provider  acetaminophen (TYLENOL) 500 MG tablet Take 1,000 mg by mouth 3 (three) times daily.   Yes [provider]  amitriptyline (ELAVIL) 50 MG tablet TAKE TWO TABLETS BY MOUTH EVERYDAY AT BEDTIME Patient taking differently: Take 100 mg by mouth at bedtime. 12/26/21  Yes  Nicholas Boyden, MD  atorvastatin (LIPITOR) 20 MG tablet TAKE ONE TABLET BY MOUTH ONCE DAILY 02/21/22  Yes Nicholas Boyden, MD  B Complex Vitamins (B-COMPLEX/B-12 PO) Take 1 tablet by mouth daily.   Yes [provider]  cetirizine (ZYRTEC) 10 MG tablet Take 1 tablet (10 mg total) by mouth daily. 07/25/22  Yes Eden Emms, NP  Cholecalciferol (VITAMIN D3) 25 MCG (1000 UT) CAPS Take 1 capsule (1,000 Units total) by mouth daily. 03/24/21  Yes Nicholas Boyden, MD  divalproex (DEPAKOTE ER) 500 MG 24 hr tablet TAKE ONE TABLET BY MOUTH EVERYDAY AT  BEDTIME 06/20/22  Yes Glean Salvo, NP  ferrous sulfate 325 (65 FE) MG EC tablet Take 325 mg by mouth daily with breakfast.   Yes [provider]  fluticasone (FLONASE) 50 MCG/ACT nasal spray Place 2 sprays into both nostrils daily. 07/25/22  Yes Eden Emms, NP  Fremanezumab-vfrm (AJOVY) 225 MG/1.5ML SOAJ Inject 225 mg into the skin every 30 (thirty) days. 07/24/22  Yes Glean Salvo, NP  furosemide (LASIX) 20 MG tablet TAKE ONE TABLET BY MOUTH EVERY MORNING 05/23/22  Yes Nicholas Boyden, MD  gabapentin (NEURONTIN) 300 MG capsule TAKE ONE CAPSULE BY MOUTH EVERY MORNING and TAKE TWO CAPSULES BY MOUTH AT NOON and TAKE TWO CAPSULES BY MOUTH EVERY EVENING 08/16/22  Yes Nicholas Boyden, MD  ketoconazole (NIZORAL) 2 % shampoo Apply 1 application topically 3 (three) times a week. 12/14/19  Yes Nicholas Boyden, MD  levothyroxine (SYNTHROID) 112 MCG tablet Take 1 tablet (112 mcg total) by mouth daily before breakfast. 02/24/22  Yes Nicholas Boyden, MD  metoprolol succinate (TOPROL-XL) 100 MG 24 hr tablet TAKE ONE TABLET BY MOUTH EVERYDAY AT BEDTIME 03/22/22  Yes Nicholas Boyden, MD  nirmatrelvir/ritonavir EUA, renal dosing, (PAXLOVID) 10 x 150 MG & 10 x 100MG  TABS Take 2 tablets by mouth 2 (two) times daily for 5 days. Take nirmatrelvir (150 mg) one tablet twice daily for 5 days and ritonavir (100 mg) one tablet twice daily for 5 days. 08/15/22  08/20/22 Yes Enedina Finner, MD  nystatin (MYCOSTATIN) 100000 UNIT/ML suspension Take 5 mLs (500,000 Units total) by mouth 4 (four) times daily. 07/12/22  Yes Dugal, Wyatt Mage, FNP  pantoprazole (PROTONIX) 40 MG tablet Take 1 tablet (40 mg total) by mouth every other day. 03/01/22  Yes Nicholas Boyden, MD  tamsulosin (FLOMAX) 0.4 MG CAPS capsule Take 1 capsule (0.4 mg total) by mouth daily. 08/16/22  Yes Enedina Finner, MD  clotrimazole (LOTRIMIN) 1 % cream Apply 1 application topically 2 (two) times daily. To groin Patient taking differently: Apply 1 application  topically 2 (two) times daily as needed (rash). 05/26/18   Nicholas Boyden, MD  EPINEPHrine 0.3 mg/0.3 mL IJ SOAJ injection Inject 0.3 mg into the muscle as needed for anaphylaxis. (bee stings) 12/18/21   Nicholas Boyden, MD  methocarbamol (ROBAXIN) 500 MG tablet TAKE ONE TABLET BY MOUTH twice daily AS NEEDED FOR muscle SPASMS 07/27/22   [name], MD  nitroGLYCERIN (NITROSTAT) 0.4 MG SL tablet Place 1 tablet (0.4 mg total) under the tongue every 5 (five) minutes as needed for chest pain. 11/27/21   Nicholas Boyden, MD  ondansetron (ZOFRAN-ODT) 4 MG disintegrating tablet TAKE ONE TABLET BY MOUTH EVERY 8 HOURS AS NEEDED 04/20/22   Glean Salvo, NP  tiZANidine (ZANAFLEX) 4 MG tablet TAKE ONE TABLET BY MOUTH every SIX hours AS NEEDED FOR acute HEADACHE 07/19/22   Glean Salvo, NP  Ubrogepant (UBRELVY) 100 MG TABS Take 100 mg by mouth as needed (take 1 tablet at onset of headache, may repeat in hours, max is 200 mg in 24 hours). 07/24/22   Glean Salvo, NP    Inpatient Medications: Scheduled Meds:  vitamin C  500 mg Oral Daily   B-complex with vitamin C  1 tablet Oral Daily   cholecalciferol  1,000 Units Oral Daily   divalproex  500 mg Oral Daily   enoxaparin (LOVENOX) injection  0.5 mg/kg Subcutaneous Q24H   ferrous sulfate  325 mg Oral Q breakfast   fluticasone  2 spray Each Nare Daily  furosemide  20 mg Oral q morning   gabapentin   300 mg Oral Q breakfast   And   gabapentin  600 mg Oral BID   levothyroxine  112 mcg Oral Q0600   loratadine  10 mg Oral Daily   methylPREDNISolone (SOLU-MEDROL) injection  0.5 mg/kg Intravenous Q12H   Followed by   Melene Muller ON 08/20/2022] predniSONE  50 mg Oral Daily   metoprolol succinate  100 mg Oral Daily   nirmatrelvir/ritonavir EUA (renal dosing)  2 tablet Oral BID   pantoprazole  40 mg Oral QODAY   tamsulosin  0.4 mg Oral Daily   zinc sulfate  220 mg Oral Daily   Continuous Infusions:  0.9 % NaCl with KCl 20 mEq / L 100 mL/hr at 08/17/22 0117   PRN Meds: chlorpheniramine-HYDROcodone, EPINEPHrine, guaiFENesin-dextromethorphan, magnesium hydroxide, methocarbamol, nitroGLYCERIN, ondansetron **OR** ondansetron (ZOFRAN) IV, tiZANidine, traZODone  Allergies:    Allergies  Allergen Reactions   Peanut-Containing Drug Products Anaphylaxis and Dermatitis   Yellow Jacket Venom [Bee Venom] Anaphylaxis and Other (See Comments)    Respiratory Distress   Celebrex [Celecoxib] Other (See Comments)    BLACK STOOL?MELENA?   Percocet [Oxycodone-Acetaminophen] Hives, Swelling and Other (See Comments)   Latex Rash   Nsaids Rash    Social History:   Social History   Tobacco Use   Smoking status: Never    Passive exposure: Past   Smokeless tobacco: Never  Vaping Use   Vaping Use: Never used  Substance Use Topics   Alcohol use: No    Alcohol/week: 0.0 standard drinks of alcohol    Comment: "I abused alcohol; last drink  ~ 2005"   Drug use: No     Family History:   Family History  Problem Relation Age of Onset   Coronary artery disease Father 21   Stroke Father    CAD Mother 29       stents   Breast cancer Sister    Diabetes Brother    Diabetes Sister    Colon cancer Neg Hx    Stomach cancer Neg Hx      ROS:  Please see the history of present illness. All other ROS reviewed and negative.     Physical Exam/Data:   Vitals:   08/17/22 0830 08/17/22 0832 08/17/22 0938  08/17/22 1000  BP: (!) 124/97   138/76  Pulse: (!) 42  76 86  Resp: 20   20  Temp:      TempSrc:      SpO2: 98%   96%  Weight:  106.6 kg    Height:  5\' 8"  (1.727 m)     No intake or output data in the 24 hours ending 08/17/22 1101    08/17/2022    8:32 AM 08/12/2022    6:14 AM 07/25/2022    9:33 AM  Last 3 Weights  Weight (lbs) 235 lb 235 lb 249 lb  Weight (kg) 106.595 kg 106.595 kg 112.946 kg     Body mass index is 35.73 kg/m.  General:  Well nourished, well developed, in no acute distress HEENT: normal Neck: no JVD Vascular: No carotid bruits; Distal pulses 2+ bilaterally Cardiac: Irregularly your rhythm without murmurs. Lungs: Mildly diminished breath sounds throughout with scattered rhonchi. Abd: soft, nontender, no hepatomegaly  Ext: no edema Musculoskeletal:  No deformities, BUE and BLE strength normal and equal Skin: warm and dry  Neuro:  CNs 2-12 intact, no focal abnormalities noted Psych:  Normal affect   EKG:  The  EKG was personally reviewed and demonstrates: Atrial fibrillation with PVCs versus aberrancy, rightward axis, and nonspecific ST/T changes. Telemetry:  Telemetry was personally reviewed and demonstrates: Atrial fibrillation with ventricular rates 90-120 bpm with PVCs versus aberrancy.  Relevant CV Studies: LHC (02/04/2018): Normal coronary arteries without significant disease.  Normal LVEF and LVEDP.   Laboratory Data:  High Sensitivity Troponin:   Recent Labs  Lab 08/12/22 0623 08/12/22 1058 08/16/22 2202 08/17/22 0105 08/17/22 0224  TROPONINIHS 8 8 56* 48* 44*     Chemistry Recent Labs  Lab 08/14/22 0617 08/16/22 2202 08/17/22 0224  NA 141 138 139  K 4.8 3.5 3.9  CL 107 102 105  CO2 26 22 25   GLUCOSE 127* 125* 123*  BUN 29* 27* 25*  CREATININE 1.10 1.16 0.92  CALCIUM 8.5* 8.1* 7.8*  MG 2.1  --   --   GFRNONAA >60 >60 >60  ANIONGAP 8 14 9     Recent Labs  Lab 08/13/22 0551 08/16/22 2202 08/17/22 0224  PROT 6.7 7.0 6.3*   ALBUMIN 3.3* 3.3* 2.9*  AST 113* 65* 44*  ALT 45* 48* 41  ALKPHOS 62 56 51  BILITOT 0.8 1.5* 1.3*   Lipids No results for input(s): "CHOL", "TRIG", "HDL", "LABVLDL", "LDLCALC", "CHOLHDL" in the last 168 hours.  Hematology Recent Labs  Lab 08/14/22 0617 08/16/22 2202 08/17/22 0623  WBC 11.4* 13.0* 10.1  RBC 4.60 4.63 4.20*  HGB 14.5 14.7 13.4  HCT 44.3 44.4 39.5  MCV 96.3 95.9 94.0  MCH 31.5 31.7 31.9  MCHC 32.7 33.1 33.9  RDW 13.2 13.2 13.2  PLT 165 168 168   Thyroid  Recent Labs  Lab 08/12/22 1056  TSH 1.500  FREET4 0.96    BNP Recent Labs  Lab 08/12/22 0623  BNP 124.1*    DDimer No results for input(s): "DDIMER" in the last 168 hours.   Radiology/Studies:  DG Chest Portable 1 View  Result Date: 08/16/2022 CLINICAL DATA:  Shortness of breath EXAM: PORTABLE CHEST 1 VIEW COMPARISON:  08/12/2022 FINDINGS: Bilateral shoulder replacements. Cardiomegaly with central congestion. Patchy atelectasis left base. No pleural effusion or pneumothorax. IMPRESSION: Cardiomegaly with mild central congestion, overall appearance improved since 08/12/2022. Patchy atelectasis left base. Electronically Signed   By: Jasmine Pang M.D.   On: 08/16/2022 22:26     Assessment and Plan:   Paroxysmal atrial fibrillation: Nicholas [name] believes he may have been told that he has atrial fibrillation in the past.  He presented to the ED yesterday with atrial fibrillation with rapid ventricular response in the setting of recent diagnosis of COVID-19 pneumonia leading to hospitalization and several falls.  His ventricular rate control is reasonable at this time.  Given a CHA2DS2-VASc score of at least 3-4 (age, ?CHF, and stroke x2), anticoagulation is indicated.  There is a questionable history of bleeding in the past though hemoglobin is normal at this time.  Nicholas [name] denies recent bleeding.  Given presenting hypoxia, hypotension, and tachycardia in the setting of recent hospitalization with COVID-19,  pulmonary embolism is a possibility as well. -Continue metoprolol succinate 100 mg daily. -Initiate heparin infusion with plans to transition to apixaban prior to discharge.  Given history of gastric ulcers, PPI therapy is recommended. -Favor rate control and outpatient follow-up to discuss cardioversion if atrial fibrillation persists.  If rate control becomes difficult, TEE-guided cardioversion would need to be considered. -Consider CTA chest to exclude PE at the discretion of the primary team. -Echocardiogram pending.  Elevated troponin: Minimal and downtrending  troponin noted.  In the setting of recent COVID-19 infection and presenting atrial fibrillation with rapid ventricular response, this is nonspecific and most likely represents supply-demand mismatch.  Catheterization in 2019 showed no significant CAD. -No need for inpatient work-up at this time.  COVID-19 pneumonia: -Per internal medicine.  Hypotension: This has resolved with fluid resuscitation. -Continue metoprolol succinate 100 mg daily given normalization of blood pressure. -Would recommend holding furosemide for now pending echocardiogram.   Risk Assessment/Risk Scores:  \ CHA2DS2-VASc Score = 4   This indicates a 4.8% annual risk of stroke. The patient's score is based upon: CHF History: 1 HTN History: 0 Diabetes History: 0 Stroke History: 2 Vascular Disease History: 0 Age Score: 1 Gender Score: 0   For questions or updates, please contact [facility] Please consult [email or web address] for contact info under [facility] Cardiology.  Signed, [name], MD  08/17/2022 11:01 AM

## 2022-08-17 NOTE — H&P (Signed)
PATIENT NAME: Nicholas [name]    MR#:  [id number]  DATE OF BIRTH:  19-Nov-1950  DATE OF ADMISSION:  08/16/2022  PRIMARY CARE PHYSICIAN: [name], MD   Patient is coming from: Home  REQUESTING/REFERRING PHYSICIAN: Harvest Dark, MD  CHIEF COMPLAINT:   Chief Complaint  Patient presents with   Fall    Pt bib ems for a fall. Pt c/o R sided cp, bruising notable to L side of chest. Denies head injury and blood thinners. Pt was hypotensive with systolic in the 82'U, tachy in the 140's, and 80% on RA, NRB placed in route. Pt is 100% on room air in triage     HISTORY OF PRESENT ILLNESS:  Nicholas [name] is a 71 y.o. Caucasian male with medical history significant for chronic medical problems that are mentioned below, who presented to the emergency room with a [place] of worsening generalized weakness with a couple of falls before he presented to the ER.  He was noted to be hypotensive with systolic BP in the 23N and his pulse oximetry was in the 80s.  He was also noted to be tachycardic with heart rate of 140.  He admits to cough productive of greenish sputum as well as dyspnea.  He admitted to chills but has not measured his temperature.  No loss of taste or smell.  No nausea or vomiting or abdominal pain.  He was diagnosed with COVID-19 4 days ago and was prescribed p.o. Paxlovid that he is supposed to finish on 11/6.  No chest pain or palpitations.  No dysuria, oliguria or hematuria or flank pain.  As result of his fall at right-sided chest pain with left chest bruising and denies any head injuries.  ED Course: When he came to the ER, initial BP was 139/67 and pulse symmetry was 100% on nonrebreather and was later on tapered to 2 L O2 with pulse symmetry of 95% - 96%.  Labs revealed borderline potassium of 3.5 and a BUN of 27 with albumin 3.3, AST 65 and ALT 48.  Total CK was 450 and high sensitive troponin I was 56.  CBC showed leukocytosis of 13 with neutrophilia.   Blood cultures were drawn.  UA was negative. EKG as reviewed by me : EKG showed atrial fibrillation with rapid ventricular sponsor 117.  There are on heart rate was 86. Imaging: Portable chest ray showed cardiomegaly with mild central congestion with improved overall appearance compared to 08/12/2022 with patchy atelectasis at the left base.  The patient was given 4 mg of IV Zofran and 1 L bolus of IV normal saline.  He will be admitted to a cardiac telemetry observation bed for further evaluation and management. PAST MEDICAL HISTORY:   Past Medical History:  Diagnosis Date   Acute kidney failure 08/2008   "cleared up"  no problems since   Allergy    Anxiety    Arthritis    Asthma    ?of this no inhaler   Blood transfusion    CHF (congestive heart failure) ([place])    ?of this, pt denies   CLOSTRIDIUM DIFFICILE COLITIS 07/04/2010   Annotation: 12/09, 2/10 Qualifier: Diagnosis of  By: [name], [name]     Depression with anxiety    Duodenitis determined by biopsy 02/2016   peptic likely due to aleve (erosive gastropathy with duodenal erosions)   ECZEMA 07/04/2010   Qualifier: Diagnosis of  By: [name], [name]     Elevated  liver enzymes    Fatty liver    Full dentures    GERD (gastroesophageal reflux disease)    Heart attack ([place])    08/2008 (likely demand ischemia in the setting of MSSA sepsis/R TKA  infection)10-2008   History of hiatal hernia    History of stomach ulcers    Hypothyroidism    Lower GI bleeding    MALAR AND MAXILLARY BONES CLOSED FRACTURE 07/04/2010   Annotation: ORIF Qualifier: Diagnosis of  By: [name], [name]     Migraine    "definitely"   MRSA (methicillin resistant Staphylococcus aureus)    in leg, had to place steel rod in leg   Personal history of colonic adenoma 06/01/2003   PFO (patent foramen ovale)    small PFO by 08/2008 TEE   Pneumonia 08/2008   "while in ICU"   Seizures ([place]) 2009   "long time ago"     Sleep apnea    does not wear CPAP    Stroke ([place]) 2010   unable to complete sentences at times   Wears glasses     PAST SURGICAL HISTORY:   Past Surgical History:  Procedure Laterality Date   AMPUTATION Left 01/24/2022   Procedure: LEFT GREAT TOE AMPUTATION AT METATARSOPHALANGEAL JOINT AND SECOND TOE AMPUTATION;  Surgeon: Newt Minion, MD;  Location: [facility];  Service: Orthopedics;  Laterality: Left;   anterior  nerve transposition  07/2009   left ulnar nerve   antibiotic spacer exchange  11/2008; 08/2006   right knee   ARTHROTOMY  08/2008   right knee w/I&D   CARDIOVASCULAR STRESS TEST  2013   stress EKG - negative for ischemia, 4 min 7.3 METs, normal blood pressure response   CATARACT EXTRACTION     COLONOSCOPY  11/2012   diverticulosis, rpt 10 yrs [name])   ESOPHAGOGASTRODUODENOSCOPY  02/2016   erosive gastropathy with duodenal erosions [name])   HARDWARE REMOVAL  04/2006   right knee w/antibiotic spacers placed   INGUINAL HERNIA REPAIR  early 1990's   bilateral   JOINT REPLACEMENT Bilateral    KNEE ARTHROSCOPY  10/2001   right   KNEE FUSION  03/2009   right knee removal; antibiotic spacers;    LEFT HEART CATH AND CORONARY ANGIOGRAPHY N/A 02/04/2018    WNL (Patwardhan, Reynold Bowen, MD)   LUMBAR LAMINECTOMY/DECOMPRESSION MICRODISCECTOMY N/A 12/05/2018   Procedure: LUMBAR THREE TO FOUR AND LUMBAR FOUR TO FIVE DECOMPRESSION;  Surgeon: [name], MD;  Location: [facility];  Service: Orthopedics;  Laterality: N/A;   MULTIPLE TOOTH EXTRACTIONS     REPLACEMENT TOTAL KNEE  08/2008; 09/2001   right   REVERSE SHOULDER ARTHROPLASTY Left 09/03/2017   Procedure: REVERSE SHOULDER ARTHROPLASTY;  Surgeon: Meredith Pel, MD;  Location: [facility];  Service: Orthopedics;  Laterality: Left;   REVERSE SHOULDER ARTHROPLASTY Right 05/13/2018   Procedure: RIGHT REVERSE SHOULDER ARTHROPLASTY;  Surgeon: Meredith Pel, MD;  Location: [facility];  Service: Orthopedics;  Laterality: Right;   rod placement Right 03/2009   R knee    SYNOVECTOMY  06/2005   debridement, liner exchange right knee   TOE AMPUTATION Left 08/2008   great toe - osteomyelitis (staph infection)   TOE AMPUTATION Left 04/2016   2nd toe [name]   TONSILLECTOMY     TOTAL KNEE ARTHROPLASTY Left 01/15/2017   TOTAL KNEE ARTHROPLASTY Left 01/15/2017   Procedure: TOTAL KNEE ARTHROPLASTY;  Surgeon: Meredith Pel, MD;  Location: [facility];  Service: Orthopedics;  Laterality: Left;  SOCIAL HISTORY:   Social History   Tobacco Use   Smoking status: Never    Passive exposure: Past   Smokeless tobacco: Never  Substance Use Topics   Alcohol use: No    Alcohol/week: 0.0 standard drinks of alcohol    Comment: "I abused alcohol; last drink  ~ 2005"    FAMILY HISTORY:   Family History  Problem Relation Age of Onset   Coronary artery disease Father 67   Stroke Father    CAD Mother 19       stents   Breast cancer Sister    Diabetes Brother    Diabetes Sister    Colon cancer Neg Hx    Stomach cancer Neg Hx     DRUG ALLERGIES:   Allergies  Allergen Reactions   Peanut-Containing Drug Products Anaphylaxis and Dermatitis   Yellow Jacket Venom [Bee Venom] Anaphylaxis and Other (See Comments)    Respiratory Distress   Celebrex [Celecoxib] Other (See Comments)    BLACK STOOL?MELENA?   Percocet [Oxycodone-Acetaminophen] Hives, Swelling and Other (See Comments)   Latex Rash   Nsaids Rash    REVIEW OF SYSTEMS:   ROS As per history of present illness. All pertinent systems were reviewed above. Constitutional, HEENT, cardiovascular, respiratory, GI, GU, musculoskeletal, neuro, psychiatric, endocrine, integumentary and hematologic systems were reviewed and are otherwise negative/unremarkable except for positive findings mentioned above in the HPI.   MEDICATIONS AT HOME:   Prior to Admission medications   Medication Sig Start Date End Date Taking? Authorizing Provider  acetaminophen (TYLENOL) 500 MG tablet Take 1,000 mg by mouth 3 (three) times  daily.   Yes [provider]  amitriptyline (ELAVIL) 50 MG tablet TAKE TWO TABLETS BY MOUTH EVERYDAY AT BEDTIME Patient taking differently: Take 100 mg by mouth at bedtime. 12/26/21  Yes [name], MD  atorvastatin (LIPITOR) 20 MG tablet TAKE ONE TABLET BY MOUTH ONCE DAILY 02/21/22  Yes [name], MD  B Complex Vitamins (B-COMPLEX/B-12 PO) Take 1 tablet by mouth daily.   Yes [provider]  cetirizine (ZYRTEC) 10 MG tablet Take 1 tablet (10 mg total) by mouth daily. 07/25/22  Yes Michela Pitcher, NP  Cholecalciferol (VITAMIN D3) 25 MCG (1000 UT) CAPS Take 1 capsule (1,000 Units total) by mouth daily. 03/24/21  Yes [name], MD  divalproex (DEPAKOTE ER) 500 MG 24 hr tablet TAKE ONE TABLET BY MOUTH EVERYDAY AT BEDTIME 06/20/22  Yes Suzzanne Cloud, NP  ferrous sulfate 325 (65 FE) MG EC tablet Take 325 mg by mouth daily with breakfast.   Yes [provider]  fluticasone (FLONASE) 50 MCG/ACT nasal spray Place 2 sprays into both nostrils daily. 07/25/22  Yes Michela Pitcher, NP  Fremanezumab-vfrm (AJOVY) 225 MG/1.5ML SOAJ Inject 225 mg into the skin every 30 (thirty) days. 07/24/22  Yes Suzzanne Cloud, NP  furosemide (LASIX) 20 MG tablet TAKE ONE TABLET BY MOUTH EVERY MORNING 05/23/22  Yes [name], MD  gabapentin (NEURONTIN) 300 MG capsule TAKE ONE CAPSULE BY MOUTH EVERY MORNING and TAKE TWO CAPSULES BY MOUTH AT NOON and TAKE TWO CAPSULES BY MOUTH EVERY EVENING 08/16/22  Yes [name], MD  ketoconazole (NIZORAL) 2 % shampoo Apply 1 application topically 3 (three) times a week. 12/14/19  Yes [name], MD  levothyroxine (SYNTHROID) 112 MCG tablet Take 1 tablet (112 mcg total) by mouth daily before breakfast. 02/24/22  Yes [name], MD  metoprolol succinate (TOPROL-XL) 100 MG 24 hr tablet TAKE ONE TABLET BY  MOUTH EVERYDAY AT BEDTIME 03/22/22  Yes [name], MD  nirmatrelvir/ritonavir EUA, renal dosing, (PAXLOVID) 10 x 150 MG &  10 x '100MG'$  TABS Take 2 tablets by mouth 2 (two) times daily for 5 days. Take nirmatrelvir (150 mg) one tablet twice daily for 5 days and ritonavir (100 mg) one tablet twice daily for 5 days. 08/15/22 08/20/22 Yes Fritzi Mandes, MD  nystatin (MYCOSTATIN) 100000 UNIT/ML suspension Take 5 mLs (500,000 Units total) by mouth 4 (four) times daily. 07/12/22  Yes Dugal, Lawerance Bach, FNP  pantoprazole (PROTONIX) 40 MG tablet Take 1 tablet (40 mg total) by mouth every other day. 03/01/22  Yes [name], MD  tamsulosin (FLOMAX) 0.4 MG CAPS capsule Take 1 capsule (0.4 mg total) by mouth daily. 08/16/22  Yes Fritzi Mandes, MD  clotrimazole (LOTRIMIN) 1 % cream Apply 1 application topically 2 (two) times daily. To groin Patient taking differently: Apply 1 application  topically 2 (two) times daily as needed (rash). 05/26/18   [name], MD  EPINEPHrine 0.3 mg/0.3 mL IJ SOAJ injection Inject 0.3 mg into the muscle as needed for anaphylaxis. (bee stings) 12/18/21   [name], MD  methocarbamol (ROBAXIN) 500 MG tablet TAKE ONE TABLET BY MOUTH twice daily AS NEEDED FOR muscle SPASMS 07/27/22   Newt Minion, MD  nitroGLYCERIN (NITROSTAT) 0.4 MG SL tablet Place 1 tablet (0.4 mg total) under the tongue every 5 (five) minutes as needed for chest pain. 11/27/21   [name], MD  ondansetron (ZOFRAN-ODT) 4 MG disintegrating tablet TAKE ONE TABLET BY MOUTH EVERY 8 HOURS AS NEEDED 04/20/22   Suzzanne Cloud, NP  tiZANidine (ZANAFLEX) 4 MG tablet TAKE ONE TABLET BY MOUTH every SIX hours AS NEEDED FOR acute HEADACHE 07/19/22   Suzzanne Cloud, NP  Ubrogepant (UBRELVY) 100 MG TABS Take 100 mg by mouth as needed (take 1 tablet at onset of headache, may repeat in hours, max is 200 mg in 24 hours). 07/24/22   Suzzanne Cloud, NP      VITAL SIGNS:  Blood pressure 126/67, pulse 80, temperature 98 F (36.7 C), temperature source Oral, resp. rate 19, SpO2 95 %.  PHYSICAL EXAMINATION:  Physical Exam  GENERAL:  71  y.o.-year-old Caucasian male patient lying in the bed with no acute distress.  EYES: Pupils equal, round, reactive to light and accommodation. No scleral icterus. Extraocular muscles intact.  HEENT: Head atraumatic, normocephalic. Oropharynx and nasopharynx clear.  NECK:  Supple, no jugular venous distention. No thyroid enlargement, no tenderness.  LUNGS: Normal breath sounds bilaterally, no wheezing, rales,rhonchi or crepitation. No use of accessory muscles of respiration.  CARDIOVASCULAR: Irregularly irregular rhythm, S1, S2 normal. No murmurs, rubs, or gallops.  ABDOMEN: Soft, nondistended, nontender. Bowel sounds present. No organomegaly or mass.  EXTREMITIES: No pedal edema, cyanosis, or clubbing.  NEUROLOGIC: Cranial nerves II through XII are intact. Muscle strength 5/5 in all extremities. Sensation intact. Gait not checked.  PSYCHIATRIC: The patient is alert and oriented x 3.  Normal affect and good eye contact. SKIN: No obvious rash, lesion, or ulcer.   LABORATORY PANEL:   CBC Recent Labs  Lab 08/16/22 2202  WBC 13.0*  HGB 14.7  HCT 44.4  PLT 168   ------------------------------------------------------------------------------------------------------------------  Chemistries  Recent Labs  Lab 08/14/22 0617 08/16/22 2202 08/17/22 0224  NA 141   < > 139  K 4.8   < > 3.9  CL 107   < > 105  CO2 26   < > 25  GLUCOSE  127*   < > 123*  BUN 29*   < > 25*  CREATININE 1.10   < > 0.92  CALCIUM 8.5*   < > 7.8*  MG 2.1  --   --   AST  --    < > 44*  ALT  --    < > 41  ALKPHOS  --    < > 51  BILITOT  --    < > 1.3*   < > = values in this interval not displayed.   ------------------------------------------------------------------------------------------------------------------  Cardiac Enzymes No results for input(s): "TROPONINI" in the last 168  hours. ------------------------------------------------------------------------------------------------------------------  RADIOLOGY:  DG Chest Portable 1 View  Result Date: 08/16/2022 CLINICAL DATA:  Shortness of breath EXAM: PORTABLE CHEST 1 VIEW COMPARISON:  08/12/2022 FINDINGS: Bilateral shoulder replacements. Cardiomegaly with central congestion. Patchy atelectasis left base. No pleural effusion or pneumothorax. IMPRESSION: Cardiomegaly with mild central congestion, overall appearance improved since 08/12/2022. Patchy atelectasis left base. Electronically Signed   By: Donavan Foil M.D.   On: 08/16/2022 22:26      IMPRESSION AND PLAN:  Assessment and Plan: * Generalized weakness - This is multi factorial secondary to COVID-19 as well as atrial fibrillation with rapid ventricular response.  It led to subsequent fall. - The patient was admitted to a cardiac telemetry bed. - We will continue hydration with IV normal saline. - We will continue his follow-up Paxlovid. - We will follow inflammatory markers. - He will be placed on vitamin C as well as zinc sulfate - Cardiology consult and 2D echo be obtained.. - I notified Dr. Aundra Dubin about the patient.  Hypothyroidism - We will continue Synthroid.  Seizure disorder ([place]) - We will continue Depakote DR.  Dyslipidemia - We will hold off statin therapy given elevated CK level which could be related to mild rhabdomyolysis.       DVT prophylaxis: Lovenox.  Advanced Care Planning:  Code Status: full code.  Family Communication:  The plan of care was discussed in details with the patient (and family). I answered all questions. The patient agreed to proceed with the above mentioned plan. Further management will depend upon hospital course. Disposition Plan: Back to previous home environment Consults called: Cardiology All the records are reviewed and case discussed with ED provider.  Status is: Observation   I certify that at the  time of admission, it is my clinical judgment that the patient will require  hospital care extending less than 2 midnights.                            Dispo: The patient is from: Home              Anticipated d/c is to: Home              Patient currently is not medically stable to d/c.              Difficult to place patient: No  Christel Mormon M.D on 08/17/2022 at 6:38 AM  [facility]   From 7 PM-7 AM, contact night-coverage [email or web address]  CC: Primary care physician; [name], MD

## 2022-08-17 NOTE — Progress Notes (Signed)
  Brief Progress Note (See full H&P from earlier today)   Subjective: Pt feeling a bit improved this afternoon,   Objective: Relevant new results:  CTA chest NO pulmonary embolus  Physical Exam:  BP 121/68   Pulse 78   Temp 98.2 F (36.8 C) (Oral)   Resp 16   Ht '5\' 8"'$  (1.727 m)   Wt 106.6 kg   SpO2 94%   BMI 35.73 kg/m  Constitutional:  General Appearance: alert, well-developed, well-nourished, NAD Respiratory: Normal respiratory effort Breath sounds normal, no wheeze/rhonchi/rales Cardiovascular: S1/S2 normal, no murmur/rub/gallop auscultated No lower extremity edema Gastrointestinal: Nontender, no masses Musculoskeletal:  No clubbing/cyanosis of digits Neurological: No cranial nerve deficit on limited exam Psychiatric: Normal judgment/insight Normal mood and affect   Assessment/Plan changes or updates compared to H&P: Echo pending  No need for cardiac procedures at this point  Holding furosemide for now pending Echo  Heparin infusion per cardiology, transition to [place] prior to discharge

## 2022-08-17 NOTE — Assessment & Plan Note (Signed)
-   We will continue Synthroid. 

## 2022-08-17 NOTE — ED Notes (Signed)
Informed RN bed assigned 

## 2022-08-17 NOTE — Assessment & Plan Note (Addendum)
-   This is multi factorial secondary to COVID-19 as well as atrial fibrillation with rapid ventricular response.  It led to subsequent fall. - The patient was admitted to a cardiac telemetry bed. - We will continue hydration with IV normal saline. - We will continue his follow-up Paxlovid. - We will follow inflammatory markers. - He will be placed on vitamin C as well as zinc sulfate - Cardiology consult and 2D echo be obtained.. - I notified Dr. Aundra Dubin about the patient.

## 2022-08-17 NOTE — ED Notes (Signed)
Pt son called and updated on pt room number.

## 2022-08-17 NOTE — Assessment & Plan Note (Signed)
-   We will hold off statin therapy given elevated CK level which could be related to mild rhabdomyolysis.

## 2022-08-18 DIAGNOSIS — Q2112 Patent foramen ovale: Secondary | ICD-10-CM | POA: Diagnosis not present

## 2022-08-18 DIAGNOSIS — I2489 Other forms of acute ischemic heart disease: Secondary | ICD-10-CM | POA: Diagnosis present

## 2022-08-18 DIAGNOSIS — S20212A Contusion of left front wall of thorax, initial encounter: Secondary | ICD-10-CM | POA: Diagnosis present

## 2022-08-18 DIAGNOSIS — E785 Hyperlipidemia, unspecified: Secondary | ICD-10-CM | POA: Diagnosis present

## 2022-08-18 DIAGNOSIS — B9561 Methicillin susceptible Staphylococcus aureus infection as the cause of diseases classified elsewhere: Secondary | ICD-10-CM

## 2022-08-18 DIAGNOSIS — I5032 Chronic diastolic (congestive) heart failure: Secondary | ICD-10-CM | POA: Diagnosis not present

## 2022-08-18 DIAGNOSIS — R7881 Bacteremia: Secondary | ICD-10-CM | POA: Diagnosis present

## 2022-08-18 DIAGNOSIS — A419 Sepsis, unspecified organism: Secondary | ICD-10-CM | POA: Diagnosis not present

## 2022-08-18 DIAGNOSIS — Z79899 Other long term (current) drug therapy: Secondary | ICD-10-CM | POA: Diagnosis not present

## 2022-08-18 DIAGNOSIS — E669 Obesity, unspecified: Secondary | ICD-10-CM | POA: Diagnosis present

## 2022-08-18 DIAGNOSIS — I330 Acute and subacute infective endocarditis: Secondary | ICD-10-CM | POA: Diagnosis not present

## 2022-08-18 DIAGNOSIS — J9811 Atelectasis: Secondary | ICD-10-CM | POA: Diagnosis present

## 2022-08-18 DIAGNOSIS — Z8711 Personal history of peptic ulcer disease: Secondary | ICD-10-CM | POA: Diagnosis not present

## 2022-08-18 DIAGNOSIS — I340 Nonrheumatic mitral (valve) insufficiency: Secondary | ICD-10-CM | POA: Diagnosis not present

## 2022-08-18 DIAGNOSIS — Z89412 Acquired absence of left great toe: Secondary | ICD-10-CM | POA: Diagnosis not present

## 2022-08-18 DIAGNOSIS — E1122 Type 2 diabetes mellitus with diabetic chronic kidney disease: Secondary | ICD-10-CM | POA: Diagnosis present

## 2022-08-18 DIAGNOSIS — U071 COVID-19: Secondary | ICD-10-CM | POA: Diagnosis present

## 2022-08-18 DIAGNOSIS — E11621 Type 2 diabetes mellitus with foot ulcer: Secondary | ICD-10-CM | POA: Diagnosis not present

## 2022-08-18 DIAGNOSIS — E039 Hypothyroidism, unspecified: Secondary | ICD-10-CM | POA: Diagnosis present

## 2022-08-18 DIAGNOSIS — I4819 Other persistent atrial fibrillation: Secondary | ICD-10-CM | POA: Diagnosis not present

## 2022-08-18 DIAGNOSIS — J9601 Acute respiratory failure with hypoxia: Secondary | ICD-10-CM | POA: Diagnosis present

## 2022-08-18 DIAGNOSIS — I2510 Atherosclerotic heart disease of native coronary artery without angina pectoris: Secondary | ICD-10-CM | POA: Diagnosis present

## 2022-08-18 DIAGNOSIS — I351 Nonrheumatic aortic (valve) insufficiency: Secondary | ICD-10-CM | POA: Diagnosis not present

## 2022-08-18 DIAGNOSIS — I130 Hypertensive heart and chronic kidney disease with heart failure and stage 1 through stage 4 chronic kidney disease, or unspecified chronic kidney disease: Secondary | ICD-10-CM | POA: Diagnosis present

## 2022-08-18 DIAGNOSIS — I700 Atherosclerosis of aorta: Secondary | ICD-10-CM | POA: Diagnosis present

## 2022-08-18 DIAGNOSIS — I4891 Unspecified atrial fibrillation: Secondary | ICD-10-CM | POA: Diagnosis not present

## 2022-08-18 DIAGNOSIS — Z7901 Long term (current) use of anticoagulants: Secondary | ICD-10-CM | POA: Diagnosis not present

## 2022-08-18 DIAGNOSIS — G40909 Epilepsy, unspecified, not intractable, without status epilepticus: Secondary | ICD-10-CM | POA: Diagnosis present

## 2022-08-18 DIAGNOSIS — I959 Hypotension, unspecified: Secondary | ICD-10-CM | POA: Diagnosis present

## 2022-08-18 DIAGNOSIS — I480 Paroxysmal atrial fibrillation: Secondary | ICD-10-CM | POA: Diagnosis present

## 2022-08-18 DIAGNOSIS — Z89422 Acquired absence of other left toe(s): Secondary | ICD-10-CM | POA: Diagnosis not present

## 2022-08-18 DIAGNOSIS — R531 Weakness: Secondary | ICD-10-CM | POA: Diagnosis present

## 2022-08-18 DIAGNOSIS — Z6837 Body mass index (BMI) 37.0-37.9, adult: Secondary | ICD-10-CM | POA: Diagnosis not present

## 2022-08-18 DIAGNOSIS — W19XXXA Unspecified fall, initial encounter: Secondary | ICD-10-CM | POA: Diagnosis present

## 2022-08-18 LAB — HEPARIN LEVEL (UNFRACTIONATED)
Heparin Unfractionated: 0.52 IU/mL (ref 0.30–0.70)
Heparin Unfractionated: 0.55 IU/mL (ref 0.30–0.70)

## 2022-08-18 LAB — CBC WITH DIFFERENTIAL/PLATELET
Abs Immature Granulocytes: 0.14 10*3/uL — ABNORMAL HIGH (ref 0.00–0.07)
Basophils Absolute: 0 10*3/uL (ref 0.0–0.1)
Basophils Relative: 0 %
Eosinophils Absolute: 0 10*3/uL (ref 0.0–0.5)
Eosinophils Relative: 0 %
HCT: 38.9 % — ABNORMAL LOW (ref 39.0–52.0)
Hemoglobin: 13.2 g/dL (ref 13.0–17.0)
Immature Granulocytes: 2 %
Lymphocytes Relative: 5 %
Lymphs Abs: 0.5 10*3/uL — ABNORMAL LOW (ref 0.7–4.0)
MCH: 31.9 pg (ref 26.0–34.0)
MCHC: 33.9 g/dL (ref 30.0–36.0)
MCV: 94 fL (ref 80.0–100.0)
Monocytes Absolute: 0.7 10*3/uL (ref 0.1–1.0)
Monocytes Relative: 8 %
Neutro Abs: 7.3 10*3/uL (ref 1.7–7.7)
Neutrophils Relative %: 85 %
Platelets: 183 10*3/uL (ref 150–400)
RBC: 4.14 MIL/uL — ABNORMAL LOW (ref 4.22–5.81)
RDW: 13.2 % (ref 11.5–15.5)
WBC: 8.6 10*3/uL (ref 4.0–10.5)
nRBC: 0 % (ref 0.0–0.2)

## 2022-08-18 LAB — COMPREHENSIVE METABOLIC PANEL
ALT: 34 U/L (ref 0–44)
AST: 26 U/L (ref 15–41)
Albumin: 2.6 g/dL — ABNORMAL LOW (ref 3.5–5.0)
Alkaline Phosphatase: 47 U/L (ref 38–126)
Anion gap: 6 (ref 5–15)
BUN: 27 mg/dL — ABNORMAL HIGH (ref 8–23)
CO2: 26 mmol/L (ref 22–32)
Calcium: 7.7 mg/dL — ABNORMAL LOW (ref 8.9–10.3)
Chloride: 107 mmol/L (ref 98–111)
Creatinine, Ser: 0.9 mg/dL (ref 0.61–1.24)
GFR, Estimated: >60 mL/min (ref >60)
Glucose, Bld: 163 mg/dL — ABNORMAL HIGH (ref 70–99)
Potassium: 4.1 mmol/L (ref 3.5–5.1)
Sodium: 139 mmol/L (ref 135–145)
Total Bilirubin: 0.8 mg/dL (ref 0.3–1.2)
Total Protein: 5.9 g/dL — ABNORMAL LOW (ref 6.5–8.1)

## 2022-08-18 LAB — C-REACTIVE PROTEIN: CRP: 4.2 mg/dL — ABNORMAL HIGH (ref <1.0)

## 2022-08-18 LAB — FERRITIN: Ferritin: 673 ng/mL — ABNORMAL HIGH (ref 24–336)

## 2022-08-18 NOTE — Consult Note (Signed)
ANTICOAGULATION CONSULT NOTE - Follow Up Consult  Pharmacy Consult for heparin gtt Indication: atrial fibrillation  Allergies  Allergen Reactions   Peanut-Containing Drug Products Anaphylaxis and Dermatitis   Yellow Jacket Venom [Bee Venom] Anaphylaxis and Other (See Comments)    Respiratory Distress   Celebrex [Celecoxib] Other (See Comments)    BLACK STOOL?MELENA?   Percocet [Oxycodone-Acetaminophen] Hives, Swelling and Other (See Comments)   Latex Rash   Nsaids Rash    Patient Measurements: Height: '5\' 8"'$  (172.7 cm) Weight: 106.6 kg (235 lb) IBW/kg (Calculated) : 68.4 Heparin Dosing Weight: 91.8 kg  Vital Signs: Temp: 97.7 F (36.5 C) (11/04 1101) Temp Source: Oral (11/04 1101) BP: 125/76 (11/04 1101) Pulse Rate: 76 (11/04 1101)  Labs: Recent Labs    08/16/22 2202 08/17/22 0105 08/17/22 0224 08/17/22 0623 08/17/22 1931 08/18/22 0829  HGB 14.7  --   --  13.4  --  13.2  HCT 44.4  --   --  39.5  --  38.9*  PLT 168  --   --  168  --  183  APTT  --   --   --   --  30  --   LABPROT  --   --   --   --  15.6*  --   INR  --   --   --   --  1.3*  --   HEPARINUNFRC  --   --   --   --   --  0.52  CREATININE 1.16  --  0.92  --   --  0.90  CKTOTAL 450*  --   --   --   --   --   TROPONINIHS 56* 48* 44*  --   --   --      Estimated Creatinine Clearance: 89.1 mL/min (by C-G formula based on SCr of 0.9 mg/dL).   Medications:  +heparin gtt  Enoxaparin ppx prior to heparin consult No PTA anticoagulation  Assessment: 71 y.o. male with a hx of stroke, MI with nonobstructive coronary artery disease, questionable CHF, gastric ulcers, and obstructive sleep apnea who was evaluated for atrial fibrillation with rapid ventricular response and elevated troponin.  Cards recommends heparin gtt with transition to apixaban prior to discharge.  Baseline labs: aPTT 30, INR 1.3, Hgb 13.4, Plts 168  11/4  0829  HL=0.52   therapeutic x1    Goal of Therapy:  Heparin level 0.3-0.7  units/ml Monitor platelets by anticoagulation protocol: Yes   Plan:  11/4  0829  HL=0.52   therapeutic x1 Continue heparin infusion at 1350 units/hr Check confirmatory anti-Xa level in 8 hours and daily while on heparin Continue to monitor H&H and platelets daily  Phuong Moffatt A 08/18/2022,11:46 AM

## 2022-08-18 NOTE — Progress Notes (Signed)
Progress Note  Patient Name: Nicholas [name] Date of Encounter: 08/18/2022  Primary Cardiologist: new - consult by End  Subjective   No chest pain or palpitations. Dyspnea improving. Remains in rate-controlled Afib.   Inpatient Medications    Scheduled Meds:  vitamin C  500 mg Oral Daily   B-complex with vitamin C  1 tablet Oral Daily   cholecalciferol  1,000 Units Oral Daily   divalproex  500 mg Oral Daily   ferrous sulfate  325 mg Oral Q breakfast   fluticasone  2 spray Each Nare Daily   furosemide  20 mg Oral q morning   gabapentin  300 mg Oral Q breakfast   And   gabapentin  600 mg Oral BID   influenza vaccine adjuvanted  0.5 mL Intramuscular Tomorrow-1000   levothyroxine  112 mcg Oral Q0600   loratadine  10 mg Oral Daily   methylPREDNISolone (SOLU-MEDROL) injection  0.5 mg/kg Intravenous Q12H   Followed by   Derrill Memo ON 08/20/2022] predniSONE  50 mg Oral Daily   metoprolol succinate  100 mg Oral Daily   nirmatrelvir/ritonavir EUA (renal dosing)  2 tablet Oral BID   pantoprazole  40 mg Oral QODAY   tamsulosin  0.4 mg Oral Daily   zinc sulfate  220 mg Oral Daily   Continuous Infusions:  0.9 % NaCl with KCl 20 mEq / L 100 mL/hr at 08/17/22 2219    ceFAZolin (ANCEF) IV 2 g (08/18/22 0530)   heparin 1,350 Units/hr (08/17/22 2058)   PRN Meds: chlorpheniramine-HYDROcodone, EPINEPHrine, guaiFENesin-dextromethorphan, magnesium hydroxide, methocarbamol, nitroGLYCERIN, ondansetron **OR** ondansetron (ZOFRAN) IV, tiZANidine, traZODone   Vital Signs    Vitals:   08/17/22 1700 08/17/22 1943 08/18/22 0025 08/18/22 0508  BP: 121/88 128/77 (!) 143/76 136/87  Pulse: 62 62 (!) 57 62  Resp: '20 20 18 20  '$ Temp: 98 F (36.7 C) 98.4 F (36.9 C) 98.3 F (36.8 C) 97.9 F (36.6 C)  TempSrc: Oral Oral Oral Oral  SpO2: 97% 96% 96% 94%  Weight:      Height:        Intake/Output Summary (Last 24 hours) at 08/18/2022 0851 Last data filed at 08/18/2022 0500 Gross per 24 hour  Intake  2483.23 ml  Output 1000 ml  Net 1483.23 ml   Filed Weights   08/17/22 0832  Weight: 106.6 kg    Telemetry    Afib with ventricular rates in the 70s bpm - Personally Reviewed  ECG    No new tracings - Personally Reviewed  Physical Exam   GEN: No acute distress.   Neck: No JVD. Cardiac: IRIR, no murmurs, rubs, or gallops.  Respiratory: Diminished breath sounds bilaterally.  GI: Soft, nontender, non-distended.   MS: No edema; No deformity. Neuro:  Alert and oriented x 3; Nonfocal.  Psych: Normal affect.  Labs    Chemistry Recent Labs  Lab 08/13/22 0551 08/14/22 0617 08/16/22 2202 08/17/22 0224  NA 139 141 138 139  K 4.7 4.8 3.5 3.9  CL 106 107 102 105  CO2 '27 26 22 25  '$ GLUCOSE 138* 127* 125* 123*  BUN 26* 29* 27* 25*  CREATININE 1.33* 1.10 1.16 0.92  CALCIUM 8.6* 8.5* 8.1* 7.8*  PROT 6.7  --  7.0 6.3*  ALBUMIN 3.3*  --  3.3* 2.9*  AST 113*  --  65* 44*  ALT 45*  --  48* 41  ALKPHOS 62  --  56 51  BILITOT 0.8  --  1.5* 1.3*  GFRNONAA  57* >60 >60 >60  ANIONGAP '6 8 14 9     '$ Hematology Recent Labs  Lab 08/14/22 0617 08/16/22 2202 08/17/22 0623  WBC 11.4* 13.0* 10.1  RBC 4.60 4.63 4.20*  HGB 14.5 14.7 13.4  HCT 44.3 44.4 39.5  MCV 96.3 95.9 94.0  MCH 31.5 31.7 31.9  MCHC 32.7 33.1 33.9  RDW 13.2 13.2 13.2  PLT 165 168 168    Cardiac EnzymesNo results for input(s): "TROPONINI" in the last 168 hours. No results for input(s): "TROPIPOC" in the last 168 hours.   BNP Recent Labs  Lab 08/12/22 0623  BNP 124.1*     DDimer No results for input(s): "DDIMER" in the last 168 hours.   Radiology    CT Angio Chest Pulmonary Embolism (PE) W or WO Contrast  Result Date: 08/17/2022 IMPRESSION: 1. No filling defect is identified in the pulmonary arterial tree to suggest pulmonary embolus. 2. Mild cardiomegaly with suspected left anterior descending coronary artery atherosclerotic calcification. 3. Nonspecific ground-glass opacities favoring the lung bases  probably reflect mild pulmonary edema. 4. More confluent airspace opacity in the right lower lobe, pneumonia not excluded. 5. Airway thickening in the right lower lobe, query bronchitis or reactive airways disease. 6. Aortic atherosclerosis. Aortic Atherosclerosis (ICD10-I70.0). Electronically Signed   By: Van Clines M.D.   On: 08/17/2022 15:34   DG Chest Portable 1 View  Result Date: 08/16/2022 IMPRESSION: Cardiomegaly with mild central congestion, overall appearance improved since 08/12/2022. Patchy atelectasis left base. Electronically Signed   By: Donavan Foil M.D.   On: 08/16/2022 22:26    Cardiac Studies   2D echo pending  Patient Profile     71 y.o. male with history of MI with nonobstructive CAD, CVA, questionable CHF, gastric ulcers, and OSA with recent Covid PNA who we are seeing of Afib with RVR and elevated troponin.   Assessment & Plan    1. PAF: -Remains in Afib with controlled ventricular response -CHADS2VASc 3-4 (age x 1, ?CHF, CVA x 2) -Heparin gtt for now with recommendation for DOAC prior to discharge -Given history of gastric ulcers, PPI -Toprol XL -TSH normal -If ventricular rates remain well controlled, would favor DCCV in the outpatient setting after he has been adequately anticoagulated, without interruption, for at least 4 weeks, and after he has recovered from his acute illness -If his ventricular rates become difficult to control, would need TEE/DCCV prior to discharge   2. Elevated high sensitivity troponin: -Minimally elevated and down trending -Felt to represent supply demand ischemia in the setting of recent Covid infection and Afib with RVR -Echo pending, if preserved LVSF no plans for inpatient ischemic evaluation -LHC in 2019 showed no significant CAD -Consider outpatient coronary CTA  3. Hypotension: -Resolved with fluid resuscitation   4. MSSA bacteremia/Covid: -Per IM -Echo pending     For questions or updates, please contact [facility] Please consult [email or web address] for contact info under Cardiology/STEMI.    Signed, Christell Faith, PA-C [place] Pager: [phone] 08/18/2022, 8:51 AM

## 2022-08-18 NOTE — Consult Note (Addendum)
ANTICOAGULATION CONSULT NOTE - Follow Up Consult  Pharmacy Consult for heparin gtt Indication: atrial fibrillation  Allergies  Allergen Reactions   Peanut-Containing Drug Products Anaphylaxis and Dermatitis   Yellow Jacket Venom [Bee Venom] Anaphylaxis and Other (See Comments)    Respiratory Distress   Celebrex [Celecoxib] Other (See Comments)    BLACK STOOL?MELENA?   Percocet [Oxycodone-Acetaminophen] Hives, Swelling and Other (See Comments)   Latex Rash   Nsaids Rash    Patient Measurements: Height: '5\' 8"'$  (172.7 cm) Weight: 106.6 kg (235 lb) IBW/kg (Calculated) : 68.4 Heparin Dosing Weight: 91.8 kg  Vital Signs: Temp: 97.7 F (36.5 C) (11/04 1101) Temp Source: Oral (11/04 1101) BP: 125/76 (11/04 1101) Pulse Rate: 76 (11/04 1101)  Labs: Recent Labs    08/16/22 2202 08/17/22 0105 08/17/22 0224 08/17/22 0623 08/17/22 1931 08/18/22 0829  HGB 14.7  --   --  13.4  --  13.2  HCT 44.4  --   --  39.5  --  38.9*  PLT 168  --   --  168  --  183  APTT  --   --   --   --  30  --   LABPROT  --   --   --   --  15.6*  --   INR  --   --   --   --  1.3*  --   HEPARINUNFRC  --   --   --   --   --  0.52  CREATININE 1.16  --  0.92  --   --  0.90  CKTOTAL 450*  --   --   --   --   --   TROPONINIHS 56* 48* 44*  --   --   --      Estimated Creatinine Clearance: 89.1 mL/min (by C-G formula based on SCr of 0.9 mg/dL).   Medications:  +heparin gtt  Enoxaparin ppx prior to heparin consult No PTA anticoagulation  Assessment: 71 y.o. male with a hx of stroke, MI with nonobstructive coronary artery disease, questionable CHF, gastric ulcers, and obstructive sleep apnea who was evaluated for atrial fibrillation with rapid ventricular response and elevated troponin.  Cards recommends heparin gtt with transition to apixaban prior to discharge.  Baseline labs: aPTT 30, INR 1.3, Hgb 13.4, Plts 168  11/4  0829  HL=0.52   therapeutic x1 11/4  1643  HL=0.55 Therapeutic x2; 1350 un/hr     Goal of Therapy:  Heparin level 0.3-0.7 units/ml Monitor platelets by anticoagulation protocol: Yes   Plan:  11/4  1643  HL=0.55   therapeutic x2 Continue heparin infusion at 1350 units/hr Check anti-Xa level daily while on heparin; next level with AM labs Continue to monitor H&H and platelets daily  Lorna Dibble 08/18/2022,4:46 PM

## 2022-08-18 NOTE — Progress Notes (Signed)
[facility] responded to consult request for A.D paperwork. Please contact [place] if requested by pt.

## 2022-08-18 NOTE — Progress Notes (Signed)
PROGRESS NOTE    Nicholas [name]   SAY:[id number] DOB: December 18, 1950  DOA: 08/16/2022 Date of Service: 08/18/22 PCP: [name], MD     Brief Narrative / Hospital Course:  Nicholas [name] is a 40, past medical history CAD/MI, GERD, PFO, remote history of seizure, questionable history of stroke/CHF.  Presented to the 08/16/2022 from home via EMS for fall, per EMS patient was hypotensive with systolic in 23F, tachycardic in the 140s, saturating 80% on room air. 11/02-11/03: In ED, initial BP 167, SPO2 100%), taper down to 2L [place], EKG A-fib RVR rate 117. Labs borderline potassium of 3.5 and a BUN of 27 with albumin 3.3, AST 65 and ALT 48.  Total CK was 450 and high sensitive troponin I was 56.  CBC showed leukocytosis of 13 with neutrophilia.  Blood cultures were drawn.  UA was negative.  COVID-positive.  CXR cardiomegaly with mild central congestion.  Treated with Zofran, 1 L normal saline.  Admitted to hospitalist service.  Cardiology consult placed by admitting hospitalist. 11/03: cardiology saw patient, recommended eval for PE - CTA Chest negative for PE, (+)mild pulmonary edema, RLL possible pneumonia / airway thickening, CAD and aortic atherosclerosis, cardiomegaly. Remains weak, on O2.  11/04: (+)BCx x2 for MSSA, started on Cefazolin IV, repeat BCx tomorrow, plan for ID f/u Monday (today is Saturday). Finished Paxlovid. Work on weaning off O2. Echo pending.   Consultants:  Cardiology   Procedures: none      ASSESSMENT & PLAN:   Principal Problem:   Generalized weakness Active Problems:   Chronic diastolic CHF (congestive heart failure) (HCC)   Hypothyroidism   Dyslipidemia   Seizure disorder (HCC)   Atrial fibrillation with rapid ventricular response ([place])   Fall   COVID-19   COVID-19 w/ acute hypoxic respiratory failure and pneumonia Supplemental O2 Completed Paxlovid Steroids   Atrial Fibrillation w/ RVR CHADS2VASc 3-4 (age x 1, ?CHF, CVA x 2)  Cardiology  following Rate control w/ metoprolol Heparin gtt per cardiology to transition to [place] Echo done, pending read  If ventricular rates remain well controlled, would favor DCCV in the outpatient setting after he has been adequately anticoagulated, without interruption, for at least 4 weeks, and after he has recovered from his acute illness   Hypotension Resolve s/p IV fluids   Elevated high sensitivity troponin: Likely demand ischemia in the setting of recent Covid infection and Afib with RVR LHC in 2019 showed no significant CAD Cardiology following  Echo pending, if preserved LVSF no plans for inpatient ischemic evaluation Consider outpatient coronary CTA  Bacteremia likely MSSA (+)BCx x2 started on Cefazolin IV 08/18/22  repeat BCx tomorrow 11/05 plan for ID f/u Dr Delaine Lame Monday 11/6 Echo already done, pending read   Generalized weakness and fall PT/OT   Hypothyroidism Synthroid.   Seizure disorder ([place]) Depakote DR.   Dyslipidemia hold off statin therapy given elevated CK level which could be related to mild rhabdomyolysis vs COVID inflammation   DVT prophylaxis: Heparin --> DOAC planned  Pertinent IV fluids/nutrition: stopped IV NS today  Central lines / invasive devices: none  Code Status: FULL CODE Family Communication: pt declined call to support persons today   Disposition: obs --> needs inpatient d/t bacteremia  TOC needs: none at this time  Barriers to discharge / significant pending items: bacteremia              Subjective:  Patient reports still SOB on occasion, mild cough, would like to get up and walk if  possible. No fever/chills, no rash though he notes he has eczema        Objective:  Vitals:   08/18/22 0025 08/18/22 0508 08/18/22 0940 08/18/22 1101  BP: (!) 143/76 136/87 (!) 144/86 125/76  Pulse: (!) 57 62 73 76  Resp: '18 20 18 '$ (!) 22  Temp: 98.3 F (36.8 C) 97.9 F (36.6 C)  97.7 F (36.5 C)  TempSrc: Oral Oral  Oral   SpO2: 96% 94% 97% 100%  Weight:      Height:        Intake/Output Summary (Last 24 hours) at 08/18/2022 1433 Last data filed at 08/18/2022 1100 Gross per 24 hour  Intake 2723.23 ml  Output 1000 ml  Net 1723.23 ml   Filed Weights   08/17/22 0832  Weight: 106.6 kg    Examination: Constitutional:  VS as above General Appearance: alert, well-developed, well-nourished, NAD Respiratory: Normal respiratory effort No wheeze No rales Cardiovascular: S1/S2 normal No murmur No rub/gallop auscultated No lower extremity edema Gastrointestinal: No tenderness Musculoskeletal:  No clubbing/cyanosis of digits Symmetrical movement in all extremities Neurological: No cranial nerve deficit on limited exam Alert Psychiatric: Normal judgment/insight Normal mood and affect       Scheduled Medications:   vitamin C  500 mg Oral Daily   B-complex with vitamin C  1 tablet Oral Daily   cholecalciferol  1,000 Units Oral Daily   divalproex  500 mg Oral Daily   ferrous sulfate  325 mg Oral Q breakfast   fluticasone  2 spray Each Nare Daily   furosemide  20 mg Oral q morning   gabapentin  300 mg Oral Q breakfast   And   gabapentin  600 mg Oral BID   influenza vaccine adjuvanted  0.5 mL Intramuscular Tomorrow-1000   levothyroxine  112 mcg Oral Q0600   loratadine  10 mg Oral Daily   methylPREDNISolone (SOLU-MEDROL) injection  0.5 mg/kg Intravenous Q12H   Followed by   Derrill Memo ON 08/20/2022] predniSONE  50 mg Oral Daily   metoprolol succinate  100 mg Oral Daily   pantoprazole  40 mg Oral QODAY   tamsulosin  0.4 mg Oral Daily   zinc sulfate  220 mg Oral Daily    Continuous Infusions:   ceFAZolin (ANCEF) IV 2 g (08/18/22 0530)   heparin 1,350 Units/hr (08/18/22 1008)    PRN Medications:  chlorpheniramine-HYDROcodone, EPINEPHrine, guaiFENesin-dextromethorphan, magnesium hydroxide, methocarbamol, nitroGLYCERIN, ondansetron **OR** ondansetron (ZOFRAN) IV, tiZANidine,  traZODone  Antimicrobials:  Anti-infectives (From admission, onward)    Start     Dose/Rate Route Frequency Ordered Stop   08/17/22 2030  ceFAZolin (ANCEF) IVPB 2g/100 mL premix        2 g 200 mL/hr over 30 Minutes Intravenous Every 8 hours 08/17/22 1944     08/17/22 0130  nirmatrelvir/ritonavir EUA (renal dosing) (PAXLOVID) 2 tablet  Status:  Discontinued       Note to Pharmacy: Take nirmatrelvir (150 mg) one tablet twice daily for 5 days and ritonavir (100 mg) one tablet twice daily for 5 days.     2 tablet Oral 2 times daily 08/16/22 2348 08/18/22 1210       Data Reviewed: I have personally reviewed following labs and imaging studies  CBC: Recent Labs  Lab 08/12/22 0623 08/13/22 0551 08/14/22 0617 08/16/22 2202 08/17/22 0623 08/18/22 0829  WBC 10.1 4.2 11.4* 13.0* 10.1 8.6  NEUTROABS 7.0 2.9  --  10.9* 8.8* 7.3  HGB 15.6 14.1 14.5 14.7 13.4 13.2  HCT 46.8  42.9 44.3 44.4 39.5 38.9*  MCV 96.3 96.4 96.3 95.9 94.0 94.0  PLT 152 148* 165 168 168 299   Basic Metabolic Panel: Recent Labs  Lab 08/13/22 0551 08/14/22 0617 08/16/22 2202 08/17/22 0224 08/18/22 0829  NA 139 141 138 139 139  K 4.7 4.8 3.5 3.9 4.1  CL 106 107 102 105 107  CO2 '27 26 22 25 26  '$ GLUCOSE 138* 127* 125* 123* 163*  BUN 26* 29* 27* 25* 27*  CREATININE 1.33* 1.10 1.16 0.92 0.90  CALCIUM 8.6* 8.5* 8.1* 7.8* 7.7*  MG  --  2.1  --   --   --    GFR: Estimated Creatinine Clearance: 89.1 mL/min (by C-G formula based on SCr of 0.9 mg/dL). Liver Function Tests: Recent Labs  Lab 08/12/22 0623 08/13/22 0551 08/16/22 2202 08/17/22 0224 08/18/22 0829  AST 90* 113* 65* 44* 26  ALT 31 45* 48* 41 34  ALKPHOS 73 62 56 51 47  BILITOT 1.1 0.8 1.5* 1.3* 0.8  PROT 7.7 6.7 7.0 6.3* 5.9*  ALBUMIN 4.0 3.3* 3.3* 2.9* 2.6*   No results for input(s): "LIPASE", "AMYLASE" in the last 168 hours. No results for input(s): "AMMONIA" in the last 168 hours. Coagulation Profile: Recent Labs  Lab 08/17/22 1931   INR 1.3*   Cardiac Enzymes: Recent Labs  Lab 08/12/22 0623 08/14/22 0617 08/16/22 2202  CKTOTAL 4,177* 1,836* 450*   BNP (last 3 results) No results for input(s): "PROBNP" in the last 8760 hours. HbA1C: No results for input(s): "HGBA1C" in the last 72 hours. CBG: No results for input(s): "GLUCAP" in the last 168 hours. Lipid Profile: No results for input(s): "CHOL", "HDL", "LDLCALC", "TRIG", "CHOLHDL", "LDLDIRECT" in the last 72 hours. Thyroid Function Tests: No results for input(s): "TSH", "T4TOTAL", "FREET4", "T3FREE", "THYROIDAB" in the last 72 hours. Anemia Panel: Recent Labs    08/17/22 0224 08/18/22 0829  FERRITIN 694* 673*   Urine analysis:    Component Value Date/Time   COLORURINE YELLOW (A) 08/17/2022 0105   APPEARANCEUR CLEAR (A) 08/17/2022 0105   LABSPEC 1.010 08/17/2022 0105   PHURINE 7.0 08/17/2022 0105   GLUCOSEU NEGATIVE 08/17/2022 0105   HGBUR SMALL (A) 08/17/2022 0105   BILIRUBINUR NEGATIVE 08/17/2022 0105   KETONESUR NEGATIVE 08/17/2022 0105   PROTEINUR NEGATIVE 08/17/2022 0105   UROBILINOGEN 0.2 07/14/2009 1503   NITRITE NEGATIVE 08/17/2022 0105   LEUKOCYTESUR NEGATIVE 08/17/2022 0105   Sepsis Labs: '@LABRCNTIP'$ (procalcitonin:4,lacticidven:4)  Recent Results (from the past 240 hour(s))  Resp Panel by RT-PCR (Flu A&B, Covid) Anterior Nasal Swab     Status: Abnormal   Collection Time: 08/12/22  6:23 AM   Specimen: Anterior Nasal Swab  Result Value Ref Range Status   SARS Coronavirus 2 by RT PCR POSITIVE (A) NEGATIVE Final    Comment: (NOTE) SARS-CoV-2 target nucleic acids are DETECTED.  The SARS-CoV-2 RNA is generally detectable in upper respiratory specimens during the acute phase of infection. Positive results are indicative of the presence of the identified virus, but do not rule out bacterial infection or co-infection with other pathogens not detected by the test. Clinical correlation with patient history and other diagnostic  information is necessary to determine patient infection status. The expected result is Negative.  Fact Sheet for Patients: EntrepreneurPulse.com.au  Fact Sheet for Healthcare Providers: IncredibleEmployment.be  This test is not yet approved or cleared by the [place] FDA and  has been authorized for detection and/or diagnosis of SARS-CoV-2 by FDA under an Emergency Use Authorization (EUA).  This EUA will remain in effect (meaning this test can be used) for the duration of  the COVID-19 declaration under Section 564(b)(1) of the A ct, 21 U.S.C. section 360bbb-3(b)(1), unless the authorization is terminated or revoked sooner.     Influenza A by PCR NEGATIVE NEGATIVE Final   Influenza B by PCR NEGATIVE NEGATIVE Final    Comment: (NOTE) The Xpert Xpress SARS-CoV-2/FLU/RSV plus assay is intended as an aid in the diagnosis of influenza from Nasopharyngeal swab specimens and should not be used as a sole basis for treatment. Nasal washings and aspirates are unacceptable for Xpert Xpress SARS-CoV-2/FLU/RSV testing.  Fact Sheet for Patients: EntrepreneurPulse.com.au  Fact Sheet for Healthcare Providers: IncredibleEmployment.be  This test is not yet approved or cleared by the [place] FDA and has been authorized for detection and/or diagnosis of SARS-CoV-2 by FDA under an Emergency Use Authorization (EUA). This EUA will remain in effect (meaning this test can be used) for the duration of the COVID-19 declaration under Section 564(b)(1) of the Act, 21 U.S.C. section 360bbb-3(b)(1), unless the authorization is terminated or revoked.  Performed at [facility], [address] [place], [place] [address]   Blood culture (routine x 2)     Status: None (Preliminary result)   Collection Time: 08/16/22 10:02 PM   Specimen: BLOOD  Result Value Ref Range Status   Specimen Description BLOOD LEFT HAND   Final   Special Requests   Final    BOTTLES DRAWN AEROBIC AND ANAEROBIC Blood Culture results may not be optimal due to an inadequate volume of blood received in culture bottles   Culture  Setup Time   Final    GRAM POSITIVE COCCI ANAEROBIC BOTTLE ONLY GRAM STAIN REVIEWED-AGREE WITH RESULT CRITICAL RESULT CALLED TO, READ BACK BY AND VERIFIED WITH: NATHAN BELUE AT 2323 08/17/2022 GA Performed at [facility], [address], [place], [place] [address]    Culture GRAM POSITIVE COCCI  Final   Report Status PENDING  Incomplete  Blood culture (routine x 2)     Status: None (Preliminary result)   Collection Time: 08/17/22  2:54 AM   Specimen: BLOOD RIGHT ARM  Result Value Ref Range Status   Specimen Description BLOOD RIGHT ARM  Final   Special Requests   Final    BOTTLES DRAWN AEROBIC AND ANAEROBIC Blood Culture results may not be optimal due to an inadequate volume of blood received in culture bottles   Culture  Setup Time   Final    GRAM POSITIVE COCCI AEROBIC BOTTLE ONLY Organism ID to follow CRITICAL RESULT CALLED TO, READ BACK BY AND VERIFIED WITH: PHARMD BRANDON BEERS AT [place] 08/17/2022 GAA Performed at [facility], [address] [place], [place] [address]    Culture GRAM POSITIVE COCCI  Final   Report Status PENDING  Incomplete  Blood Culture ID Panel (Reflexed)     Status: Abnormal   Collection Time: 08/17/22  2:54 AM  Result Value Ref Range Status   Enterococcus faecalis NOT DETECTED NOT DETECTED Final   Enterococcus Faecium NOT DETECTED NOT DETECTED Final   Listeria monocytogenes NOT DETECTED NOT DETECTED Final   Staphylococcus species DETECTED (A) NOT DETECTED Final    Comment: CRITICAL RESULT CALLED TO, READ BACK BY AND VERIFIED WITH: PHARMD BRANDON BEERS AT 1930 08/17/2022 GAA    Staphylococcus aureus (BCID) DETECTED (A) NOT DETECTED Final    Comment: CRITICAL RESULT CALLED TO, READ BACK BY AND VERIFIED WITH: PHARMD BRANDON BEERS AT 1930 08/17/2022  GAA    Staphylococcus epidermidis NOT DETECTED NOT DETECTED Final   Staphylococcus lugdunensis NOT DETECTED NOT DETECTED Final   Streptococcus species NOT DETECTED NOT DETECTED Final   Streptococcus agalactiae NOT DETECTED NOT DETECTED Final   Streptococcus pneumoniae NOT DETECTED NOT DETECTED Final   Streptococcus pyogenes NOT DETECTED NOT DETECTED Final   A.calcoaceticus-baumannii NOT DETECTED NOT DETECTED Final   Bacteroides fragilis NOT DETECTED NOT DETECTED Final   Enterobacterales NOT DETECTED NOT DETECTED Final   Enterobacter cloacae complex NOT DETECTED NOT DETECTED Final   Escherichia coli NOT DETECTED NOT DETECTED Final   Klebsiella aerogenes NOT DETECTED NOT DETECTED Final   Klebsiella oxytoca NOT DETECTED NOT DETECTED Final   Klebsiella pneumoniae NOT DETECTED NOT DETECTED Final   Proteus species NOT DETECTED NOT DETECTED Final   Salmonella species NOT DETECTED NOT DETECTED Final   Serratia marcescens NOT DETECTED NOT DETECTED Final   Haemophilus influenzae NOT DETECTED NOT DETECTED Final   Neisseria meningitidis NOT DETECTED NOT DETECTED Final   Pseudomonas aeruginosa NOT DETECTED NOT DETECTED Final   Stenotrophomonas maltophilia NOT DETECTED NOT DETECTED Final   Candida albicans NOT DETECTED NOT DETECTED Final   Candida auris NOT DETECTED NOT DETECTED Final   Candida glabrata NOT DETECTED NOT DETECTED Final   Candida krusei NOT DETECTED NOT DETECTED Final   Candida parapsilosis NOT DETECTED NOT DETECTED Final   Candida tropicalis NOT DETECTED NOT DETECTED Final   Cryptococcus neoformans/gattii NOT DETECTED NOT DETECTED Final   Meth resistant mecA/C and MREJ NOT DETECTED NOT DETECTED Final    Comment: Performed at [facility], [address] [place], [place] [address]         Radiology Studies: CT Angio Chest Pulmonary Embolism (PE) W or WO Contrast  Result Date: 08/17/2022 CLINICAL DATA:  Right-sided chest pain. Bruising along the left side of the  chest. Hypotension. EXAM: CT ANGIOGRAPHY CHEST WITH CONTRAST TECHNIQUE: Multidetector CT imaging of the chest was performed using the standard protocol during bolus administration of intravenous contrast. Multiplanar CT image reconstructions and MIPs were obtained to evaluate the vascular anatomy. RADIATION DOSE REDUCTION: This exam was performed according to the departmental dose-optimization program which includes automated exposure control, adjustment of the mA and/or kV according to patient size and/or use of iterative reconstruction technique. CONTRAST:  50m OMNIPAQUE IOHEXOL 350 MG/ML SOLN COMPARISON:  Chest radiograph 08/16/2022 and chest CT 11/01/2016 FINDINGS: Despite efforts by the technologist and patient, motion artifact is present on today's exam and could not be eliminated. This reduces exam sensitivity and specificity. Cardiovascular: No filling defect is identified in the pulmonary arterial tree to suggest pulmonary embolus. Atherosclerotic calcification of the aortic arch. Mild cardiomegaly. Suspected left anterior descending coronary artery atherosclerotic calcification. Poor opacification of the systemic vasculature including the aorta. Mediastinum/Nodes: Unremarkable Lungs/Pleura: Nonspecific ground-glass opacities favoring the lung bases potentially reflecting edema or air trapping. More confluent airspace opacity in the right lower lobe, pneumonia not excluded. There is airway thickening in the right lower lobe and peripheral atelectasis in the posterior basal segment left lower lobe. Upper Abdomen: Fluid density Bosniak category 1 cyst of the right mid kidney medially, requiring no follow up. Hypodense lesion of the right kidney upper pole anteriorly on image 143 series 4 has an internal density of 16 Hounsfield units, favoring a benign Bosniak category 2 cyst. No further imaging workup of this lesion is indicated. Musculoskeletal: Bilateral total shoulder arthroplasties. Thoracic  spondylosis. Review of the MIP images confirms the above findings. IMPRESSION: 1. No  filling defect is identified in the pulmonary arterial tree to suggest pulmonary embolus. 2. Mild cardiomegaly with suspected left anterior descending coronary artery atherosclerotic calcification. 3. Nonspecific ground-glass opacities favoring the lung bases probably reflect mild pulmonary edema. 4. More confluent airspace opacity in the right lower lobe, pneumonia not excluded. 5. Airway thickening in the right lower lobe, query bronchitis or reactive airways disease. 6. Aortic atherosclerosis. Aortic Atherosclerosis (ICD10-I70.0). Electronically Signed   By: Van Clines M.D.   On: 08/17/2022 15:34   DG Chest Portable 1 View  Result Date: 08/16/2022 CLINICAL DATA:  Shortness of breath EXAM: PORTABLE CHEST 1 VIEW COMPARISON:  08/12/2022 FINDINGS: Bilateral shoulder replacements. Cardiomegaly with central congestion. Patchy atelectasis left base. No pleural effusion or pneumothorax. IMPRESSION: Cardiomegaly with mild central congestion, overall appearance improved since 08/12/2022. Patchy atelectasis left base. Electronically Signed   By: Donavan Foil M.D.   On: 08/16/2022 22:26            LOS: 0 days      Emeterio Reeve, DO [facility] 08/18/2022, 2:33 PM   Staff may message me via secure chat in [place]  but this may not receive immediate response,  please page for urgent matters!  If 7PM-7AM, please contact night-coverage [email or web address]  Dictation software was used to generate the above note. Typos may occur and escape review, as with typed/written notes. Please contact Dr Sheppard Coil directly for clarity if needed.

## 2022-08-18 NOTE — Progress Notes (Incomplete)
PROGRESS NOTE    Nicholas [name]   IOX:[id number] DOB: October 17, 1950  DOA: 08/16/2022 Date of Service: 08/18/22 PCP: [name], MD     Brief Narrative / Hospital Course:  Nicholas [name] is a 50, past medical history CAD/MI, GERD, PFO, remote history of seizure, questionable history of stroke/CHF.  Presented to the 08/16/2022 from home via EMS for fall, per EMS patient was hypotensive with systolic in 26S, tachycardic in the 140s, saturating 80% on room air. 11/02-11/03: In ED, initial BP 167, SPO2 100%), taper down to 2L [place], EKG A-fib RVR rate 117. Labs borderline potassium of 3.5 and a BUN of 27 with albumin 3.3, AST 65 and ALT 48.  Total CK was 450 and high sensitive troponin I was 56.  CBC showed leukocytosis of 13 with neutrophilia.  Blood cultures were drawn.  UA was negative.  COVID-positive.  CXR cardiomegaly with mild central congestion.  Treated with Zofran, 1 L normal saline.  Admitted to hospitalist service.  Cardiology consult placed by admitting hospitalist. 11/03: cardiology saw patient, recommended eval for PE - CTA Chest negative for PE, (+)mild pulmonary edema, RLL possible pneumonia / airway thickening, CAD and aortic atherosclerosis, cardiomegaly. Remains weak, on O2.  11/04: (+)BCx x2 for MSSA, started on Cefazolin IV, repeat BCx tomorrow, plan for ID f/u Monday (today is Saturday). ID advised evaluate his previous admission PIV sites to assess for possible source and obtain imaging as indicated.   Consultants:  Cardiology   Procedures: none      ASSESSMENT & PLAN:   Principal Problem:   Generalized weakness Active Problems:   Chronic diastolic CHF (congestive heart failure) (HCC)   Hypothyroidism   Dyslipidemia   Seizure disorder (HCC)   Atrial fibrillation with rapid ventricular response ([place])   Fall   COVID-19   COVID-19 w/ acute hypoxic respiratory failure and pneumonia Supplemental O2 Paxlovid  Atrial Fibrillation w/ RVR CHADS2VASc 3-4 (age x  1, ?CHF, CVA x 2)  Cardiology following Rate control w/ metoprolol Heparin gtt per cardiology to transition to [place] Echo done, pending read *** If ventricular rates remain well controlled, would favor DCCV in the outpatient setting after he has been adequately anticoagulated, without interruption, for at least 4 weeks, and after he has recovered from his acute illness   Hypotension Resolve s/p IV fluids   Elevated high sensitivity troponin: Likely demand ischemia in the setting of recent Covid infection and Afib with RVR LHC in 2019 showed no significant CAD Cardiology following  Echo pending, if preserved LVSF no plans for inpatient ischemic evaluation Consider outpatient coronary CTA  Bacteremia likely MSSA (+)BCx x2 started on Cefazolin IV 08/18/22  repeat BCx tomorrow 11/05 plan for ID f/u Dr Delaine Lame Monday 11/6 Echo already done, pending read ***  Generalized weakness and fall PT/OT   Hypothyroidism Synthroid.   Seizure disorder ([place]) Depakote DR.   Dyslipidemia hold off statin therapy given elevated CK level which could be related to mild rhabdomyolysis vs COVID inflammation   DVT prophylaxis: Heparin --> DOAC planned  Pertinent IV fluids/nutrition: *** Central lines / invasive devices: ***  Code Status: *** Family Communication: ***  Disposition: *** TOC needs: *** Barriers to discharge / significant pending items: ***             Subjective:  Patient reports ***       Objective:  Vitals:   08/17/22 1700 08/17/22 1943 08/18/22 0025 08/18/22 0508  BP: 121/88 128/77 (!) 143/76 136/87  Pulse: 62 62 (!)  57 62  Resp: '20 20 18 20  '$ Temp: 98 F (36.7 C) 98.4 F (36.9 C) 98.3 F (36.8 C) 97.9 F (36.6 C)  TempSrc: Oral Oral Oral Oral  SpO2: 97% 96% 96% 94%  Weight:      Height:        Intake/Output Summary (Last 24 hours) at 08/18/2022 2595 Last data filed at 08/18/2022 0500 Gross per 24 hour  Intake 2483.23 ml  Output 1000 ml   Net 1483.23 ml   Filed Weights   08/17/22 0832  Weight: 106.6 kg    Examination: *** Constitutional:  VS as above General Appearance: alert, well-developed, well-nourished, NAD Eyes: Normal lids and conjunctive, non-icteric sclera Ears, Nose, Mouth, Throat: Normal external appearance MMM Neck: No masses, trachea midline Respiratory: Normal respiratory effort No wheeze No rhonchi No rales Cardiovascular: S1/S2 normal No murmur No rub/gallop auscultated No JVD No lower extremity edema Gastrointestinal: No tenderness No masses No hernia appreciated Musculoskeletal:  No clubbing/cyanosis of digits Symmetrical movement in all extremities Neurological: No cranial nerve deficit on limited exam Alert Psychiatric: Normal judgment/insight Normal mood and affect       Scheduled Medications:   vitamin C  500 mg Oral Daily   B-complex with vitamin C  1 tablet Oral Daily   cholecalciferol  1,000 Units Oral Daily   divalproex  500 mg Oral Daily   ferrous sulfate  325 mg Oral Q breakfast   fluticasone  2 spray Each Nare Daily   furosemide  20 mg Oral q morning   gabapentin  300 mg Oral Q breakfast   And   gabapentin  600 mg Oral BID   influenza vaccine adjuvanted  0.5 mL Intramuscular Tomorrow-1000   levothyroxine  112 mcg Oral Q0600   loratadine  10 mg Oral Daily   methylPREDNISolone (SOLU-MEDROL) injection  0.5 mg/kg Intravenous Q12H   Followed by   Derrill Memo ON 08/20/2022] predniSONE  50 mg Oral Daily   metoprolol succinate  100 mg Oral Daily   nirmatrelvir/ritonavir EUA (renal dosing)  2 tablet Oral BID   pantoprazole  40 mg Oral QODAY   tamsulosin  0.4 mg Oral Daily   zinc sulfate  220 mg Oral Daily    Continuous Infusions:  0.9 % NaCl with KCl 20 mEq / L 100 mL/hr at 08/17/22 2219    ceFAZolin (ANCEF) IV 2 g (08/18/22 0530)   heparin 1,350 Units/hr (08/17/22 2058)    PRN Medications:  chlorpheniramine-HYDROcodone, EPINEPHrine,  guaiFENesin-dextromethorphan, magnesium hydroxide, methocarbamol, nitroGLYCERIN, ondansetron **OR** ondansetron (ZOFRAN) IV, tiZANidine, traZODone  Antimicrobials:  Anti-infectives (From admission, onward)    Start     Dose/Rate Route Frequency Ordered Stop   08/17/22 2030  ceFAZolin (ANCEF) IVPB 2g/100 mL premix        2 g 200 mL/hr over 30 Minutes Intravenous Every 8 hours 08/17/22 1944     08/17/22 0130  nirmatrelvir/ritonavir EUA (renal dosing) (PAXLOVID) 2 tablet       Note to Pharmacy: Take nirmatrelvir (150 mg) one tablet twice daily for 5 days and ritonavir (100 mg) one tablet twice daily for 5 days.     2 tablet Oral 2 times daily 08/16/22 2348 08/21/22 2159       Data Reviewed: I have personally reviewed following labs and imaging studies  CBC: Recent Labs  Lab 08/12/22 0623 08/13/22 0551 08/14/22 0617 08/16/22 2202 08/17/22 0623 08/18/22 0829  WBC 10.1 4.2 11.4* 13.0* 10.1 8.6  NEUTROABS 7.0 2.9  --  10.9* 8.8* 7.3  HGB 15.6 14.1 14.5 14.7 13.4 13.2  HCT 46.8 42.9 44.3 44.4 39.5 38.9*  MCV 96.3 96.4 96.3 95.9 94.0 94.0  PLT 152 148* 165 168 168 355   Basic Metabolic Panel: Recent Labs  Lab 08/12/22 0623 08/13/22 0551 08/14/22 0617 08/16/22 2202 08/17/22 0224  NA 135 139 141 138 139  K 4.5 4.7 4.8 3.5 3.9  CL 103 106 107 102 105  CO2 20* '27 26 22 25  '$ GLUCOSE 144* 138* 127* 125* 123*  BUN 26* 26* 29* 27* 25*  CREATININE 2.01* 1.33* 1.10 1.16 0.92  CALCIUM 8.9 8.6* 8.5* 8.1* 7.8*  MG  --   --  2.1  --   --    GFR: Estimated Creatinine Clearance: 87.2 mL/min (by C-G formula based on SCr of 0.92 mg/dL). Liver Function Tests: Recent Labs  Lab 08/12/22 0623 08/13/22 0551 08/16/22 2202 08/17/22 0224  AST 90* 113* 65* 44*  ALT 31 45* 48* 41  ALKPHOS 73 62 56 51  BILITOT 1.1 0.8 1.5* 1.3*  PROT 7.7 6.7 7.0 6.3*  ALBUMIN 4.0 3.3* 3.3* 2.9*   No results for input(s): "LIPASE", "AMYLASE" in the last 168 hours. No results for input(s): "AMMONIA" in  the last 168 hours. Coagulation Profile: Recent Labs  Lab 08/17/22 1931  INR 1.3*   Cardiac Enzymes: Recent Labs  Lab 08/12/22 0623 08/14/22 0617 08/16/22 2202  CKTOTAL 4,177* 1,836* 450*   BNP (last 3 results) No results for input(s): "PROBNP" in the last 8760 hours. HbA1C: No results for input(s): "HGBA1C" in the last 72 hours. CBG: No results for input(s): "GLUCAP" in the last 168 hours. Lipid Profile: No results for input(s): "CHOL", "HDL", "LDLCALC", "TRIG", "CHOLHDL", "LDLDIRECT" in the last 72 hours. Thyroid Function Tests: No results for input(s): "TSH", "T4TOTAL", "FREET4", "T3FREE", "THYROIDAB" in the last 72 hours. Anemia Panel: Recent Labs    08/17/22 0224  FERRITIN 694*   Urine analysis:    Component Value Date/Time   COLORURINE YELLOW (A) 08/17/2022 0105   APPEARANCEUR CLEAR (A) 08/17/2022 0105   LABSPEC 1.010 08/17/2022 0105   PHURINE 7.0 08/17/2022 0105   GLUCOSEU NEGATIVE 08/17/2022 0105   HGBUR SMALL (A) 08/17/2022 0105   BILIRUBINUR NEGATIVE 08/17/2022 0105   KETONESUR NEGATIVE 08/17/2022 0105   PROTEINUR NEGATIVE 08/17/2022 0105   UROBILINOGEN 0.2 07/14/2009 1503   NITRITE NEGATIVE 08/17/2022 0105   LEUKOCYTESUR NEGATIVE 08/17/2022 0105   Sepsis Labs: '@LABRCNTIP'$ (DDUKGURKYHCWC:3,JSEGBTDVVOH:6)  Recent Results (from the past 240 hour(s))  Resp Panel by RT-PCR (Flu A&B, Covid) Anterior Nasal Swab     Status: Abnormal   Collection Time: 08/12/22  6:23 AM   Specimen: Anterior Nasal Swab  Result Value Ref Range Status   SARS Coronavirus 2 by RT PCR POSITIVE (A) NEGATIVE Final    Comment: (NOTE) SARS-CoV-2 target nucleic acids are DETECTED.  The SARS-CoV-2 RNA is generally detectable in upper respiratory specimens during the acute phase of infection. Positive results are indicative of the presence of the identified virus, but do not rule out bacterial infection or co-infection with other pathogens not detected by the test. Clinical  correlation with patient history and other diagnostic information is necessary to determine patient infection status. The expected result is Negative.  Fact Sheet for Patients: EntrepreneurPulse.com.au  Fact Sheet for Healthcare Providers: IncredibleEmployment.be  This test is not yet approved or cleared by the [place] FDA and  has been authorized for detection and/or diagnosis of SARS-CoV-2 by FDA under an Emergency Use Authorization (EUA).  This  EUA will remain in effect (meaning this test can be used) for the duration of  the COVID-19 declaration under Section 564(b)(1) of the A ct, 21 U.S.C. section 360bbb-3(b)(1), unless the authorization is terminated or revoked sooner.     Influenza A by PCR NEGATIVE NEGATIVE Final   Influenza B by PCR NEGATIVE NEGATIVE Final    Comment: (NOTE) The Xpert Xpress SARS-CoV-2/FLU/RSV plus assay is intended as an aid in the diagnosis of influenza from Nasopharyngeal swab specimens and should not be used as a sole basis for treatment. Nasal washings and aspirates are unacceptable for Xpert Xpress SARS-CoV-2/FLU/RSV testing.  Fact Sheet for Patients: EntrepreneurPulse.com.au  Fact Sheet for Healthcare Providers: IncredibleEmployment.be  This test is not yet approved or cleared by the [place] FDA and has been authorized for detection and/or diagnosis of SARS-CoV-2 by FDA under an Emergency Use Authorization (EUA). This EUA will remain in effect (meaning this test can be used) for the duration of the COVID-19 declaration under Section 564(b)(1) of the Act, 21 U.S.C. section 360bbb-3(b)(1), unless the authorization is terminated or revoked.  Performed at [facility], [address] [place], [place] [address]   Blood culture (routine x 2)     Status: None (Preliminary result)   Collection Time: 08/16/22 10:02 PM   Specimen: BLOOD  Result Value Ref  Range Status   Specimen Description BLOOD LEFT HAND  Final   Special Requests   Final    BOTTLES DRAWN AEROBIC AND ANAEROBIC Blood Culture results may not be optimal due to an inadequate volume of blood received in culture bottles   Culture  Setup Time   Final    GRAM POSITIVE COCCI ANAEROBIC BOTTLE ONLY GRAM STAIN REVIEWED-AGREE WITH RESULT CRITICAL RESULT CALLED TO, READ BACK BY AND VERIFIED WITH: NATHAN BELUE AT 2323 08/17/2022 GA Performed at [facility], [address] [place], [place] [address]    Culture GRAM POSITIVE COCCI  Final   Report Status PENDING  Incomplete  Blood culture (routine x 2)     Status: None (Preliminary result)   Collection Time: 08/17/22  2:54 AM   Specimen: BLOOD RIGHT ARM  Result Value Ref Range Status   Specimen Description BLOOD RIGHT ARM  Final   Special Requests   Final    BOTTLES DRAWN AEROBIC AND ANAEROBIC Blood Culture results may not be optimal due to an inadequate volume of blood received in culture bottles   Culture  Setup Time   Final    GRAM POSITIVE COCCI AEROBIC BOTTLE ONLY Organism ID to follow CRITICAL RESULT CALLED TO, READ BACK BY AND VERIFIED WITH: PHARMD BRANDON BEERS AT 1930 08/17/2022 GAA Performed at [facility], [address] [place], [place] [address]    Culture GRAM POSITIVE COCCI  Final   Report Status PENDING  Incomplete  Blood Culture ID Panel (Reflexed)     Status: Abnormal   Collection Time: 08/17/22  2:54 AM  Result Value Ref Range Status   Enterococcus faecalis NOT DETECTED NOT DETECTED Final   Enterococcus Faecium NOT DETECTED NOT DETECTED Final   Listeria monocytogenes NOT DETECTED NOT DETECTED Final   Staphylococcus species DETECTED (A) NOT DETECTED Final    Comment: CRITICAL RESULT CALLED TO, READ BACK BY AND VERIFIED WITH: PHARMD BRANDON BEERS AT 1930 08/17/2022 GAA    Staphylococcus aureus (BCID) DETECTED (A) NOT DETECTED Final    Comment: CRITICAL RESULT CALLED TO, READ BACK BY AND  VERIFIED WITH: PHARMD BRANDON BEERS AT 1930 08/17/2022 GAA  Staphylococcus epidermidis NOT DETECTED NOT DETECTED Final   Staphylococcus lugdunensis NOT DETECTED NOT DETECTED Final   Streptococcus species NOT DETECTED NOT DETECTED Final   Streptococcus agalactiae NOT DETECTED NOT DETECTED Final   Streptococcus pneumoniae NOT DETECTED NOT DETECTED Final   Streptococcus pyogenes NOT DETECTED NOT DETECTED Final   A.calcoaceticus-baumannii NOT DETECTED NOT DETECTED Final   Bacteroides fragilis NOT DETECTED NOT DETECTED Final   Enterobacterales NOT DETECTED NOT DETECTED Final   Enterobacter cloacae complex NOT DETECTED NOT DETECTED Final   Escherichia coli NOT DETECTED NOT DETECTED Final   Klebsiella aerogenes NOT DETECTED NOT DETECTED Final   Klebsiella oxytoca NOT DETECTED NOT DETECTED Final   Klebsiella pneumoniae NOT DETECTED NOT DETECTED Final   Proteus species NOT DETECTED NOT DETECTED Final   Salmonella species NOT DETECTED NOT DETECTED Final   Serratia marcescens NOT DETECTED NOT DETECTED Final   Haemophilus influenzae NOT DETECTED NOT DETECTED Final   Neisseria meningitidis NOT DETECTED NOT DETECTED Final   Pseudomonas aeruginosa NOT DETECTED NOT DETECTED Final   Stenotrophomonas maltophilia NOT DETECTED NOT DETECTED Final   Candida albicans NOT DETECTED NOT DETECTED Final   Candida auris NOT DETECTED NOT DETECTED Final   Candida glabrata NOT DETECTED NOT DETECTED Final   Candida krusei NOT DETECTED NOT DETECTED Final   Candida parapsilosis NOT DETECTED NOT DETECTED Final   Candida tropicalis NOT DETECTED NOT DETECTED Final   Cryptococcus neoformans/gattii NOT DETECTED NOT DETECTED Final   Meth resistant mecA/C and MREJ NOT DETECTED NOT DETECTED Final    Comment: Performed at [facility], [address] [place], [place] [address]         Radiology Studies: CT Angio Chest Pulmonary Embolism (PE) W or WO Contrast  Result Date: 08/17/2022 CLINICAL DATA:   Right-sided chest pain. Bruising along the left side of the chest. Hypotension. EXAM: CT ANGIOGRAPHY CHEST WITH CONTRAST TECHNIQUE: Multidetector CT imaging of the chest was performed using the standard protocol during bolus administration of intravenous contrast. Multiplanar CT image reconstructions and MIPs were obtained to evaluate the vascular anatomy. RADIATION DOSE REDUCTION: This exam was performed according to the departmental dose-optimization program which includes automated exposure control, adjustment of the mA and/or kV according to patient size and/or use of iterative reconstruction technique. CONTRAST:  45m OMNIPAQUE IOHEXOL 350 MG/ML SOLN COMPARISON:  Chest radiograph 08/16/2022 and chest CT 11/01/2016 FINDINGS: Despite efforts by the technologist and patient, motion artifact is present on today's exam and could not be eliminated. This reduces exam sensitivity and specificity. Cardiovascular: No filling defect is identified in the pulmonary arterial tree to suggest pulmonary embolus. Atherosclerotic calcification of the aortic arch. Mild cardiomegaly. Suspected left anterior descending coronary artery atherosclerotic calcification. Poor opacification of the systemic vasculature including the aorta. Mediastinum/Nodes: Unremarkable Lungs/Pleura: Nonspecific ground-glass opacities favoring the lung bases potentially reflecting edema or air trapping. More confluent airspace opacity in the right lower lobe, pneumonia not excluded. There is airway thickening in the right lower lobe and peripheral atelectasis in the posterior basal segment left lower lobe. Upper Abdomen: Fluid density Bosniak category 1 cyst of the right mid kidney medially, requiring no follow up. Hypodense lesion of the right kidney upper pole anteriorly on image 143 series 4 has an internal density of 16 Hounsfield units, favoring a benign Bosniak category 2 cyst. No further imaging workup of this lesion is indicated. Musculoskeletal:  Bilateral total shoulder arthroplasties. Thoracic spondylosis. Review of the MIP images confirms the above findings. IMPRESSION: 1. No filling defect is identified  in the pulmonary arterial tree to suggest pulmonary embolus. 2. Mild cardiomegaly with suspected left anterior descending coronary artery atherosclerotic calcification. 3. Nonspecific ground-glass opacities favoring the lung bases probably reflect mild pulmonary edema. 4. More confluent airspace opacity in the right lower lobe, pneumonia not excluded. 5. Airway thickening in the right lower lobe, query bronchitis or reactive airways disease. 6. Aortic atherosclerosis. Aortic Atherosclerosis (ICD10-I70.0). Electronically Signed   By: Van Clines M.D.   On: 08/17/2022 15:34   DG Chest Portable 1 View  Result Date: 08/16/2022 CLINICAL DATA:  Shortness of breath EXAM: PORTABLE CHEST 1 VIEW COMPARISON:  08/12/2022 FINDINGS: Bilateral shoulder replacements. Cardiomegaly with central congestion. Patchy atelectasis left base. No pleural effusion or pneumothorax. IMPRESSION: Cardiomegaly with mild central congestion, overall appearance improved since 08/12/2022. Patchy atelectasis left base. Electronically Signed   By: Donavan Foil M.D.   On: 08/16/2022 22:26            LOS: 0 days    Time spent: ***    Emeterio Reeve, DO [facility] 08/18/2022, 9:28 AM   Staff may message me via secure chat in [place]  but this may not receive immediate response,  please page for urgent matters!  If 7PM-7AM, please contact night-coverage [email or web address]  Dictation software was used to generate the above note. Typos may occur and escape review, as with typed/written notes. Please contact Dr Sheppard Coil directly for clarity if needed.

## 2022-08-18 NOTE — Consult Note (Signed)
Infectious disease brief consult note:  Patient presented to [facility] emergency department on 11/2.  Brought in by EMS for a fall and complaint of right-sided chest pain.  Found to be hypotensive, tachycardic, hypoxic.  This was associated with generalized weakness with falls.  He also reported chills.  This was in the setting of having been diagnosed with COVID-19 4 days prior for which he was prescribed Paxlovid.  He had been admitted to the hospital for this issue from 10/29 through 11/1.  He is now re-admitted given this constellation of symptoms as well as being found to have atrial fibrillation with RVR.  He was afebrile upon readmission.  WBC was elevated at 13.0.  Creatinine was improved from previous admission.  He underwent CTA of the chest to rule out PE which was negative.  The rest of his chest imaging showed nonspecific GGO in the lung bases probably reflective of mild pulmonary edema and confluent airspace opacity in the right lower lobe.  He was not started on antibiotics but blood cultures were obtained.  These are notable for gram-positive cocci in both sets.  BC ID has identified MSSA.  He has been started on cefazolin.  There were no blood cultures obtained during his previous admission.  He was initially requiring nonrebreather upon ER presentation, however, he has been weaned to 2 L via nasal cannula and is documented at 94%.  It is documented that he was on room air upon discharge 11/1.   Recommendations: -Continue cefazolin 2 g every 8 hours -Repeat blood cultures tomorrow morning (ordered) -Echocardiogram pending -Would thoroughly evaluate his previous admission PIV sites to assess for possible source and obtain imaging as indicated -ID will be following.  Dr. Delaine Lame is here on Monday    [facility] 08/18/2022, 7:57 AM

## 2022-08-19 DIAGNOSIS — E039 Hypothyroidism, unspecified: Secondary | ICD-10-CM | POA: Diagnosis not present

## 2022-08-19 DIAGNOSIS — R7881 Bacteremia: Secondary | ICD-10-CM | POA: Diagnosis not present

## 2022-08-19 DIAGNOSIS — I480 Paroxysmal atrial fibrillation: Secondary | ICD-10-CM | POA: Diagnosis not present

## 2022-08-19 DIAGNOSIS — B9561 Methicillin susceptible Staphylococcus aureus infection as the cause of diseases classified elsewhere: Secondary | ICD-10-CM | POA: Diagnosis not present

## 2022-08-19 DIAGNOSIS — I5032 Chronic diastolic (congestive) heart failure: Secondary | ICD-10-CM | POA: Diagnosis not present

## 2022-08-19 DIAGNOSIS — U071 COVID-19: Secondary | ICD-10-CM | POA: Diagnosis not present

## 2022-08-19 DIAGNOSIS — W19XXXA Unspecified fall, initial encounter: Secondary | ICD-10-CM | POA: Diagnosis not present

## 2022-08-19 LAB — CBC WITH DIFFERENTIAL/PLATELET
Abs Immature Granulocytes: 0.29 10*3/uL — ABNORMAL HIGH (ref 0.00–0.07)
Basophils Absolute: 0 10*3/uL (ref 0.0–0.1)
Basophils Relative: 0 %
Eosinophils Absolute: 0 10*3/uL (ref 0.0–0.5)
Eosinophils Relative: 0 %
HCT: 41.1 % (ref 39.0–52.0)
Hemoglobin: 13.9 g/dL (ref 13.0–17.0)
Immature Granulocytes: 4 %
Lymphocytes Relative: 5 %
Lymphs Abs: 0.4 10*3/uL — ABNORMAL LOW (ref 0.7–4.0)
MCH: 31.6 pg (ref 26.0–34.0)
MCHC: 33.8 g/dL (ref 30.0–36.0)
MCV: 93.4 fL (ref 80.0–100.0)
Monocytes Absolute: 0.5 10*3/uL (ref 0.1–1.0)
Monocytes Relative: 6 %
Neutro Abs: 6.9 10*3/uL (ref 1.7–7.7)
Neutrophils Relative %: 85 %
Platelets: 209 10*3/uL (ref 150–400)
RBC: 4.4 MIL/uL (ref 4.22–5.81)
RDW: 12.8 % (ref 11.5–15.5)
WBC: 8.1 10*3/uL (ref 4.0–10.5)
nRBC: 0 % (ref 0.0–0.2)

## 2022-08-19 LAB — COMPREHENSIVE METABOLIC PANEL
ALT: 41 U/L (ref 0–44)
AST: 29 U/L (ref 15–41)
Albumin: 2.6 g/dL — ABNORMAL LOW (ref 3.5–5.0)
Alkaline Phosphatase: 47 U/L (ref 38–126)
Anion gap: 8 (ref 5–15)
BUN: 27 mg/dL — ABNORMAL HIGH (ref 8–23)
CO2: 26 mmol/L (ref 22–32)
Calcium: 7.8 mg/dL — ABNORMAL LOW (ref 8.9–10.3)
Chloride: 103 mmol/L (ref 98–111)
Creatinine, Ser: 0.9 mg/dL (ref 0.61–1.24)
GFR, Estimated: >60 mL/min (ref >60)
Glucose, Bld: 178 mg/dL — ABNORMAL HIGH (ref 70–99)
Potassium: 4 mmol/L (ref 3.5–5.1)
Sodium: 137 mmol/L (ref 135–145)
Total Bilirubin: 1 mg/dL (ref 0.3–1.2)
Total Protein: 5.7 g/dL — ABNORMAL LOW (ref 6.5–8.1)

## 2022-08-19 LAB — FERRITIN: Ferritin: 626 ng/mL — ABNORMAL HIGH (ref 24–336)

## 2022-08-19 LAB — C-REACTIVE PROTEIN: CRP: 2.2 mg/dL — ABNORMAL HIGH (ref <1.0)

## 2022-08-19 LAB — MAGNESIUM: Magnesium: 2.2 mg/dL (ref 1.7–2.4)

## 2022-08-19 LAB — HEPARIN LEVEL (UNFRACTIONATED): Heparin Unfractionated: 0.47 IU/mL (ref 0.30–0.70)

## 2022-08-19 MED ORDER — LABETALOL HCL 5 MG/ML IV SOLN
10.0000 mg | Freq: Once | INTRAVENOUS | Status: DC
  Filled 2022-08-19: qty 4

## 2022-08-19 MED ORDER — PROMETHAZINE HCL 25 MG RE SUPP: 25.0000 mg | Freq: Four times a day (QID) | RECTAL | Status: DC | PRN

## 2022-08-19 MED ORDER — METHYLPREDNISOLONE SODIUM SUCC 125 MG IJ SOLR
0.5000 mg/kg | Freq: Two times a day (BID) | INTRAMUSCULAR | Status: DC
  Filled 2022-08-19: qty 2

## 2022-08-19 MED ORDER — SUMATRIPTAN SUCCINATE 50 MG PO TABS
50.0000 mg | ORAL_TABLET | ORAL | Status: DC | PRN
  Filled 2022-08-19: qty 1

## 2022-08-19 NOTE — Plan of Care (Signed)

## 2022-08-19 NOTE — Progress Notes (Signed)
Progress Note  Patient Name: Nicholas [name] Date of Encounter: 08/19/2022  Primary Cardiologist: new - consult by End  Subjective  No acute events overnight. Does not feel at baseline but gradually improving. Breathing stable.   Inpatient Medications    Scheduled Meds:  vitamin C  500 mg Oral Daily   B-complex with vitamin C  1 tablet Oral Daily   cholecalciferol  1,000 Units Oral Daily   divalproex  500 mg Oral Daily   ferrous sulfate  325 mg Oral Q breakfast   fluticasone  2 spray Each Nare Daily   furosemide  20 mg Oral q morning   gabapentin  300 mg Oral Q breakfast   And   gabapentin  600 mg Oral BID   influenza vaccine adjuvanted  0.5 mL Intramuscular Tomorrow-1000   levothyroxine  112 mcg Oral Q0600   loratadine  10 mg Oral Daily   methylPREDNISolone (SOLU-MEDROL) injection  0.5 mg/kg Intravenous Q12H   Followed by   Derrill Memo ON 08/20/2022] predniSONE  50 mg Oral Daily   metoprolol succinate  100 mg Oral Daily   pantoprazole  40 mg Oral QODAY   tamsulosin  0.4 mg Oral Daily   zinc sulfate  220 mg Oral Daily   Continuous Infusions:   ceFAZolin (ANCEF) IV 2 g (08/19/22 0546)   heparin 1,350 Units/hr (08/19/22 0413)   PRN Meds: chlorpheniramine-HYDROcodone, EPINEPHrine, guaiFENesin-dextromethorphan, magnesium hydroxide, methocarbamol, nitroGLYCERIN, ondansetron **OR** ondansetron (ZOFRAN) IV, promethazine, SUMAtriptan, tiZANidine, traZODone   Vital Signs    Vitals:   08/18/22 1722 08/18/22 2106 08/19/22 0007 08/19/22 0418  BP: 133/79 131/81 (!) 142/85 (!) 140/92  Pulse: 63 69 73 (!) 47  Resp: '20 19 18   '$ Temp: 98 F (36.7 C) 98.4 F (36.9 C) 98.2 F (36.8 C) 98.4 F (36.9 C)  TempSrc: Oral Oral Oral Oral  SpO2: 99% 98% 99% 97%  Weight:      Height:        Intake/Output Summary (Last 24 hours) at 08/19/2022 1148 Last data filed at 08/19/2022 0600 Gross per 24 hour  Intake 714.15 ml  Output 1850 ml  Net -1135.85 ml   Filed Weights   08/17/22 0832   Weight: 106.6 kg    Telemetry    Afib, rate controlled - Personally Reviewed  ECG    No new tracings - Personally Reviewed  Physical Exam   GEN: Well nourished, well developed in no acute distress NECK: No JVD CARDIAC: irregularly irregular rhythm, normal S1 and S2, no rubs or gallops. No murmur. VASCULAR: Radial pulses 2+ bilaterally.  RESPIRATORY:  Clear to auscultation without rales, wheezing or rhonchi  ABDOMEN: Soft, non-tender, non-distended MUSCULOSKELETAL:  Moves all 4 limbs independently SKIN: Warm and dry, no edema NEUROLOGIC:  No focal neuro deficits noted. PSYCHIATRIC:  Normal affect    Labs    Chemistry Recent Labs  Lab 08/17/22 0224 08/18/22 0829 08/19/22 0616  NA 139 139 137  K 3.9 4.1 4.0  CL 105 107 103  CO2 '25 26 26  '$ GLUCOSE 123* 163* 178*  BUN 25* 27* 27*  CREATININE 0.92 0.90 0.90  CALCIUM 7.8* 7.7* 7.8*  PROT 6.3* 5.9* 5.7*  ALBUMIN 2.9* 2.6* 2.6*  AST 44* 26 29  ALT 41 34 41  ALKPHOS 51 47 47  BILITOT 1.3* 0.8 1.0  GFRNONAA >60 >60 >60  ANIONGAP '9 6 8     '$ Hematology Recent Labs  Lab 08/17/22 0623 08/18/22 0829 08/19/22 0616  WBC 10.1 8.6 8.1  RBC 4.20* 4.14* 4.40  HGB 13.4 13.2 13.9  HCT 39.5 38.9* 41.1  MCV 94.0 94.0 93.4  MCH 31.9 31.9 31.6  MCHC 33.9 33.9 33.8  RDW 13.2 13.2 12.8  PLT 168 183 209    Cardiac EnzymesNo results for input(s): "TROPONINI" in the last 168 hours. No results for input(s): "TROPIPOC" in the last 168 hours.   BNP No results for input(s): "BNP", "PROBNP" in the last 168 hours.    DDimer No results for input(s): "DDIMER" in the last 168 hours.   Radiology    CT Angio Chest Pulmonary Embolism (PE) W or WO Contrast  Result Date: 08/17/2022 IMPRESSION: 1. No filling defect is identified in the pulmonary arterial tree to suggest pulmonary embolus. 2. Mild cardiomegaly with suspected left anterior descending coronary artery atherosclerotic calcification. 3. Nonspecific ground-glass opacities  favoring the lung bases probably reflect mild pulmonary edema. 4. More confluent airspace opacity in the right lower lobe, pneumonia not excluded. 5. Airway thickening in the right lower lobe, query bronchitis or reactive airways disease. 6. Aortic atherosclerosis. Aortic Atherosclerosis (ICD10-I70.0). Electronically Signed   By: Van Clines M.D.   On: 08/17/2022 15:34   DG Chest Portable 1 View  Result Date: 08/16/2022 IMPRESSION: Cardiomegaly with mild central congestion, overall appearance improved since 08/12/2022. Patchy atelectasis left base. Electronically Signed   By: Donavan Foil M.D.   On: 08/16/2022 22:26    Cardiac Studies   2D echo pending  Patient Profile     71 y.o. male with history of MI with nonobstructive CAD, CVA, questionable CHF, gastric ulcers, and OSA with recent Covid PNA who we are seeing of Afib with RVR and elevated troponin.   Assessment & Plan    PAF: -Remains in Afib with controlled ventricular response -CHADS2VASc 3-4 (age x 1, ?CHF, CVA x 2) -Heparin gtt for now with recommendation for DOAC prior to discharge -Given history of gastric ulcers, PPI -Toprol XL -TSH normal -If ventricular rates remain well controlled, would favor DCCV in the outpatient setting after he has been adequately anticoagulated, without interruption, for at least 4 weeks, and after he has recovered from his acute illness -If his ventricular rates become difficult to control, would need TEE/DCCV prior to discharge   Elevated high sensitivity troponin: -Minimally elevated and down trending -Felt to represent supply demand ischemia in the setting of recent Covid infection and Afib with RVR -Echo pending, if preserved LVSF no plans for inpatient ischemic evaluation -LHC in 2019 showed no significant CAD -Consider outpatient coronary CTA  MSSA bacteremia/Covid: -per primary team and ID -would prefer not to do TEE while he has active Covid given aerosolizing procedure,  reactive airway. Awaiting TTE. If unremarkable, will need to discuss if/when TEE needed with ID.     For questions or updates, please contact [place] Please consult [email or web address] for contact info under Cardiology/STEMI.    Signed, Buford Dresser, MD, PhD, [facility] at [facility] at [facility] [address] [place], [place] [address] [phone]  08/19/2022, 11:48 AM

## 2022-08-19 NOTE — Progress Notes (Signed)
EOS: On call contacted overnight re: migraines. A&Ox4. Urinal at bedside. No c/o pain. O2 sats adequate on 2L via . Pt has received advanced care planning documents and plans to sign today after discussion with family.

## 2022-08-19 NOTE — Consult Note (Signed)
ANTICOAGULATION CONSULT NOTE - Follow Up Consult  Pharmacy Consult for heparin gtt Indication: atrial fibrillation  Allergies  Allergen Reactions   Peanut-Containing Drug Products Anaphylaxis and Dermatitis   Yellow Jacket Venom [Bee Venom] Anaphylaxis and Other (See Comments)    Respiratory Distress   Celebrex [Celecoxib] Other (See Comments)    BLACK STOOL?MELENA?   Percocet [Oxycodone-Acetaminophen] Hives, Swelling and Other (See Comments)   Latex Rash   Nsaids Rash    Patient Measurements: Height: '5\' 8"'$  (172.7 cm) Weight: 106.6 kg (235 lb) IBW/kg (Calculated) : 68.4 Heparin Dosing Weight: 91.8 kg  Vital Signs: Temp: 98.4 F (36.9 C) (11/05 0418) Temp Source: Oral (11/05 0418) BP: 140/92 (11/05 0418) Pulse Rate: 47 (11/05 0418)  Labs: Recent Labs    08/16/22 2202 08/17/22 0105 08/17/22 0224 08/17/22 0109 08/17/22 1931 08/18/22 0829 08/18/22 1643 08/19/22 0616  HGB 14.7  --   --  13.4  --  13.2  --  13.9  HCT 44.4  --   --  39.5  --  38.9*  --  41.1  PLT 168  --   --  168  --  183  --  209  APTT  --   --   --   --  30  --   --   --   LABPROT  --   --   --   --  15.6*  --   --   --   INR  --   --   --   --  1.3*  --   --   --   HEPARINUNFRC  --   --   --   --   --  0.52 0.55 0.47  CREATININE 1.16  --  0.92  --   --  0.90  --  0.90  CKTOTAL 450*  --   --   --   --   --   --   --   TROPONINIHS 56* 48* 44*  --   --   --   --   --      Estimated Creatinine Clearance: 89.1 mL/min (by C-G formula based on SCr of 0.9 mg/dL).   Medications:  +heparin gtt  Enoxaparin ppx prior to heparin consult No PTA anticoagulation  Assessment: 71 y.o. male with a hx of stroke, MI with nonobstructive coronary artery disease, questionable CHF, gastric ulcers, and obstructive sleep apnea who was evaluated for atrial fibrillation with rapid ventricular response and elevated troponin.  Cards recommends heparin gtt with transition to apixaban prior to discharge.  Baseline labs:  aPTT 30, INR 1.3, Hgb 13.4, Plts 168  11/4 0829  HL=0.52   therapeutic x1 11/4 1643  HL=0.55   Therapeutic x2; 1350 un/hr 11/5 0616  HL=0.47    therapeutic    Goal of Therapy:  Heparin level 0.3-0.7 units/ml Monitor platelets by anticoagulation protocol: Yes   Plan:  11/5 0616  HL=0.47    therapeutic Continue heparin infusion at 1350 units/hr Check anti-Xa level daily while on heparin; next level with AM labs Continue to monitor H&H and platelets daily  Kaitrin Seybold A 08/19/2022,7:39 AM

## 2022-08-19 NOTE — Evaluation (Signed)
Physical Therapy Evaluation Patient Details Name: Nicholas [name] MRN: [id number] DOB: 22-Jan-1951 Today's Date: 08/19/2022  History of Present Illness  Pt is a 71 y/o M admitted on 08/16/22 after presenting with c/o hypotension, tachycardia & low SpO2. Pt is being treated for Covid 19 with acute hypoxic respiratory failure & PNA. PMH: CAD, MI, GERD, PFO, remote hx of seizure, questionable hx of stroke, CHF  Clinical Impression  Pt seen for PT evaluation with pt agreeable to tx. Pt reports he resides at [place] apartment & is ambulatory with rollator but endorses many recent falls 2/2 increasing weakness. On this date, pt is able to complete bed mobility with mod I, STS with supervision, and step pivot to recliner with RW & supervision. Pt then requests to sit 2/2 not feeling well & endorses dizziness. BP significantly elevated & nurse & MD immediately notified via secure chat. Pt left in recliner. Due to pt living alone with PRN assist from family, and hx of falls, will recommend STR upon d/c to maximize independence with functional mobility & reduce fall risk prior to return home.  BP checked in LUE: 162/128 mmHg MAP 138 Rechecked 171/151 mmHg MAP 159    Recommendations for follow up therapy are one component of a multi-disciplinary discharge planning process, led by the attending physician.  Recommendations may be updated based on patient status, additional functional criteria and insurance authorization.  Follow Up Recommendations Skilled nursing-short term rehab (<3 hours/day) Can patient physically be transported by private vehicle: Yes    Assistance Recommended at Discharge Intermittent Supervision/Assistance  Patient can return home with the following  A little help with walking and/or transfers;A little help with bathing/dressing/bathroom;Assistance with cooking/housework;Assist for transportation;Help with stairs or ramp for entrance    Equipment Recommendations None recommended by PT   Recommendations for Other Services       Functional Status Assessment Patient has had a recent decline in their functional status and demonstrates the ability to make significant improvements in function in a reasonable and predictable amount of time.     Precautions / Restrictions Precautions Precautions: Fall Restrictions Weight Bearing Restrictions: No      Mobility  Bed Mobility Overal bed mobility: Needs Assistance Bed Mobility: Supine to Sit     Supine to sit: Modified independent (Device/Increase time), HOB elevated          Transfers Overall transfer level: Needs assistance Equipment used: Rolling walker (2 wheels) Transfers: Sit to/from Stand, Bed to chair/wheelchair/BSC Sit to Stand: Supervision   Step pivot transfers: Supervision, Min guard (supervision except min guard when reporting he does not feel well)       General transfer comment: Pt is able to transfer STS with RW & step pivot bed>recliner with RW.    Ambulation/Gait                  Stairs            Wheelchair Mobility    Modified Rankin (Stroke Patients Only)       Balance Overall balance assessment: Needs assistance Sitting-balance support: No upper extremity supported, Feet supported Sitting balance-Leahy Scale: Good     Standing balance support: Bilateral upper extremity supported, During functional activity, Reliant on assistive device for balance Standing balance-Leahy Scale: Good                               Pertinent Vitals/Pain Pain Assessment Pain Assessment: Faces Faces Pain  Scale: Hurts even more Pain Location: HA Pain Descriptors / Indicators: Headache Pain Intervention(s): Monitored during session    Home Living Family/patient expects to be discharged to:: Private residence Living Arrangements: Alone Available Help at Discharge: Available PRN/intermittently;Family Type of Home: Independent living facility Home Access: Ramped  entrance       Home Layout: One level Home Equipment: Rollator (4 wheels);Cane - single point;Electric scooter Additional Comments: [facility] for senior living.    Prior Function Prior Level of Function : Independent/Modified Independent;Driving             Mobility Comments: Pt reports he's ambulatory with rollator but endorses many falls recently, getting weaker as well.       Hand Dominance        Extremity/Trunk Assessment        Lower Extremity Assessment Lower Extremity Assessment: Generalized weakness;RLE deficits/detail RLE Deficits / Details: R knee maintains extension at all times as pt reports he has a "steel rod" in it & has been unable to flex knee at all x 2 years       Communication   Communication: HOH  Cognition Arousal/Alertness: Awake/alert Behavior During Therapy: WFL for tasks assessed/performed Overall Cognitive Status: Within Functional Limits for tasks assessed                                 General Comments: Pt follows commands throughout session.        General Comments General comments (skin integrity, edema, etc.): Pt on 2L/min via nasal cannula with SpO2 94%    Exercises     Assessment/Plan    PT Assessment Patient needs continued PT services  PT Problem List Decreased strength;Decreased activity tolerance;Decreased balance;Decreased mobility;Cardiopulmonary status limiting activity;Decreased range of motion       PT Treatment Interventions Gait training;DME instruction;Therapeutic exercise;Balance training;Functional mobility training;Therapeutic activities;Patient/family education;Neuromuscular re-education    PT Goals (Current goals can be found in the Care Plan section)  Acute Rehab PT Goals Patient Stated Goal: feel better PT Goal Formulation: With patient Time For Goal Achievement: 09/02/22 Potential to Achieve Goals: Fair    Frequency Min 2X/week     Co-evaluation                AM-PAC PT "6 Clicks" Mobility  Outcome Measure Help needed turning from your back to your side while in a flat bed without using bedrails?: None Help needed moving from lying on your back to sitting on the side of a flat bed without using bedrails?: None Help needed moving to and from a bed to a chair (including a wheelchair)?: A Little Help needed standing up from a chair using your arms (e.g., wheelchair or bedside chair)?: A Little Help needed to walk in hospital room?: A Little Help needed climbing 3-5 steps with a railing? : A Lot 6 Click Score: 19    End of Session Equipment Utilized During Treatment: Oxygen Activity Tolerance: Treatment limited secondary to medical complications (Comment) Patient left: in chair;with chair alarm set;with call bell/phone within reach Nurse Communication: Mobility status (notified MD & nurse of BP) PT Visit Diagnosis: History of falling (Z91.81);Unsteadiness on feet (R26.81);Muscle weakness (generalized) (M62.81)    Time: 6834-1962 PT Time Calculation (min) (ACUTE ONLY): 24 min   Charges:   PT Evaluation $PT Eval Moderate Complexity: [place], PT, DPT 08/19/22, 12:49 PM  Waunita Schooner 08/19/2022, 12:46 PM

## 2022-08-19 NOTE — Progress Notes (Signed)
PROGRESS NOTE    Nicholas [name]   GGE:[id number] DOB: 15-Jun-1951  DOA: 08/16/2022 Date of Service: 08/19/22 PCP: [name], MD     Brief Narrative / Hospital Course:  Nicholas [name] is a 88, past medical history CAD/MI, GERD, PFO, remote history of seizure, questionable history of stroke/CHF.  Presented to the 08/16/2022 from home via EMS for fall, per EMS patient was hypotensive with systolic in 46T, tachycardic in the 140s, saturating 80% on room air. 11/02-11/03: In ED, initial BP 167, SPO2 100%), taper down to 2L [place], EKG A-fib RVR rate 117. Labs borderline potassium of 3.5 and a BUN of 27 with albumin 3.3, AST 65 and ALT 48.  Total CK was 450 and high sensitive troponin I was 56.  CBC showed leukocytosis of 13 with neutrophilia.  Blood cultures were drawn.  UA was negative.  COVID-positive.  CXR cardiomegaly with mild central congestion.  Treated with Zofran, 1 L normal saline.  Admitted to hospitalist service.  Cardiology consult placed by admitting hospitalist. 11/03: cardiology saw patient, recommended eval for PE - CTA Chest negative for PE, (+)mild pulmonary edema, RLL possible pneumonia / airway thickening, CAD and aortic atherosclerosis, cardiomegaly. Remains weak, on O2.  11/04: (+)BCx x2 for MSSA, started on Cefazolin IV, repeat BCx tomorrow, plan for ID f/u Monday (today is Saturday). Finished Paxlovid. Work on weaning off O2. Echo pending.  11/05: pt reports migraine, typically takes depakote for prevention which he has been getting. Hypertensive. Added labetalol IV prn, ok for phenergan for migraine. See A/P.   Consultants:  Cardiology   Procedures: none      ASSESSMENT & PLAN:   Principal Problem:   Generalized weakness Active Problems:   Chronic diastolic CHF (congestive heart failure) (HCC)   Hypothyroidism   Dyslipidemia   Seizure disorder (HCC)   Atrial fibrillation with rapid ventricular response ([place])   Fall   COVID-19   COVID-19 w/ acute  hypoxic respiratory failure and pneumonia Supplemental O2 Completed Paxlovid Steroids   Atrial Fibrillation w/ RVR CHADS2VASc 3-4 (age x 1, ?CHF, CVA x 2)  Cardiology following Rate control w/ metoprolol Heparin gtt per cardiology to transition to [place] Echo done, pending read  If ventricular rates remain well controlled, would favor DCCV in the outpatient setting after he has been adequately anticoagulated, without interruption, for at least 4 weeks, and after he has recovered from his acute illness   Hypotension Resolve s/p IV fluids   Elevated high sensitivity troponin: Likely demand ischemia in the setting of recent Covid infection and Afib with RVR LHC in 2019 showed no significant CAD Cardiology following  Echo pending, if preserved LVSF no plans for inpatient ischemic evaluation Consider outpatient coronary CTA  Bacteremia likely MSSA (+)BCx x2 started on Cefazolin IV 08/18/22  repeat BCx tomorrow 11/05 plan for ID f/u Dr Delaine Lame Monday 11/6 Echo already done, pending read   Migraine Continue depakote ppx NSAID allergy - can't have this HTN - can't safely given triptan until BP improved Already on steroids  Ordered phenergan Consider Fioricet   Generalized weakness and fall PT/OT   Hypothyroidism Synthroid.   Seizure disorder ([place]) Depakote DR.   Dyslipidemia hold off statin therapy given elevated CK level which could be related to mild rhabdomyolysis vs COVID inflammation   DVT prophylaxis: Heparin --> DOAC planned  Pertinent IV fluids/nutrition: stopped IV NS today  Central lines / invasive devices: none  Code Status: FULL CODE Family Communication: pt declined call to support persons today  Disposition: obs --> needs inpatient d/t bacteremia  TOC needs: none at this time  Barriers to discharge / significant pending items: bacteremia              Subjective:  Patient reports still SOB on occasion, mild cough, stable breathing  issues. No fever/chills. Reports migraine typical of his usual migraine headaches, worse in afternoon per RN. No chest pain.        Objective:  Vitals:   08/18/22 1722 08/18/22 2106 08/19/22 0007 08/19/22 0418  BP: 133/79 131/81 (!) 142/85 (!) 140/92  Pulse: 63 69 73 (!) 47  Resp: '20 19 18   '$ Temp: 98 F (36.7 C) 98.4 F (36.9 C) 98.2 F (36.8 C) 98.4 F (36.9 C)  TempSrc: Oral Oral Oral Oral  SpO2: 99% 98% 99% 97%  Weight:      Height:        Intake/Output Summary (Last 24 hours) at 08/19/2022 1213 Last data filed at 08/19/2022 0600 Gross per 24 hour  Intake 714.15 ml  Output 1850 ml  Net -1135.85 ml    Filed Weights   08/17/22 0832  Weight: 106.6 kg    Examination: Constitutional:  VS as above General Appearance: alert, well-developed, well-nourished, NAD, eating breakfast and watching football game.  Respiratory: Normal respiratory effort No wheeze No rales Cardiovascular: S1/S2 normal No murmur No rub/gallop auscultated No lower extremity edema Gastrointestinal: No tenderness Musculoskeletal:  No clubbing/cyanosis of digits Symmetrical movement in all extremities Neurological: No cranial nerve deficit on limited exam Alert Psychiatric: Normal judgment/insight Normal mood and affect       Scheduled Medications:   vitamin C  500 mg Oral Daily   B-complex with vitamin C  1 tablet Oral Daily   cholecalciferol  1,000 Units Oral Daily   divalproex  500 mg Oral Daily   ferrous sulfate  325 mg Oral Q breakfast   fluticasone  2 spray Each Nare Daily   furosemide  20 mg Oral q morning   gabapentin  300 mg Oral Q breakfast   And   gabapentin  600 mg Oral BID   influenza vaccine adjuvanted  0.5 mL Intramuscular Tomorrow-1000   labetalol  10 mg Intravenous Once   levothyroxine  112 mcg Oral Q0600   loratadine  10 mg Oral Daily   methylPREDNISolone (SOLU-MEDROL) injection  0.5 mg/kg Intravenous Q12H   Followed by   Derrill Memo ON 08/20/2022]  predniSONE  50 mg Oral Daily   metoprolol succinate  100 mg Oral Daily   pantoprazole  40 mg Oral QODAY   tamsulosin  0.4 mg Oral Daily   zinc sulfate  220 mg Oral Daily    Continuous Infusions:   ceFAZolin (ANCEF) IV 2 g (08/19/22 0546)   heparin 1,350 Units/hr (08/19/22 0413)    PRN Medications:  chlorpheniramine-HYDROcodone, EPINEPHrine, guaiFENesin-dextromethorphan, magnesium hydroxide, methocarbamol, nitroGLYCERIN, ondansetron **OR** ondansetron (ZOFRAN) IV, promethazine, tiZANidine, traZODone  Antimicrobials:  Anti-infectives (From admission, onward)    Start     Dose/Rate Route Frequency Ordered Stop   08/17/22 2030  ceFAZolin (ANCEF) IVPB 2g/100 mL premix        2 g 200 mL/hr over 30 Minutes Intravenous Every 8 hours 08/17/22 1944     08/17/22 0130  nirmatrelvir/ritonavir EUA (renal dosing) (PAXLOVID) 2 tablet  Status:  Discontinued       Note to Pharmacy: Take nirmatrelvir (150 mg) one tablet twice daily for 5 days and ritonavir (100 mg) one tablet twice daily for 5 days.  2 tablet Oral 2 times daily 08/16/22 2348 08/18/22 1210       Data Reviewed: I have personally reviewed following labs and imaging studies  CBC: Recent Labs  Lab 08/13/22 0551 08/14/22 0617 08/16/22 2202 08/17/22 0623 08/18/22 0829 08/19/22 0616  WBC 4.2 11.4* 13.0* 10.1 8.6 8.1  NEUTROABS 2.9  --  10.9* 8.8* 7.3 6.9  HGB 14.1 14.5 14.7 13.4 13.2 13.9  HCT 42.9 44.3 44.4 39.5 38.9* 41.1  MCV 96.4 96.3 95.9 94.0 94.0 93.4  PLT 148* 165 168 168 183 778    Basic Metabolic Panel: Recent Labs  Lab 08/14/22 0617 08/16/22 2202 08/17/22 0224 08/18/22 0829 08/19/22 0616  NA 141 138 139 139 137  K 4.8 3.5 3.9 4.1 4.0  CL 107 102 105 107 103  CO2 '26 22 25 26 26  '$ GLUCOSE 127* 125* 123* 163* 178*  BUN 29* 27* 25* 27* 27*  CREATININE 1.10 1.16 0.92 0.90 0.90  CALCIUM 8.5* 8.1* 7.8* 7.7* 7.8*  MG 2.1  --   --   --  2.2    GFR: Estimated Creatinine Clearance: 89.1 mL/min (by C-G  formula based on SCr of 0.9 mg/dL). Liver Function Tests: Recent Labs  Lab 08/13/22 0551 08/16/22 2202 08/17/22 0224 08/18/22 0829 08/19/22 0616  AST 113* 65* 44* 26 29  ALT 45* 48* 41 34 41  ALKPHOS 62 56 51 47 47  BILITOT 0.8 1.5* 1.3* 0.8 1.0  PROT 6.7 7.0 6.3* 5.9* 5.7*  ALBUMIN 3.3* 3.3* 2.9* 2.6* 2.6*    No results for input(s): "LIPASE", "AMYLASE" in the last 168 hours. No results for input(s): "AMMONIA" in the last 168 hours. Coagulation Profile: Recent Labs  Lab 08/17/22 1931  INR 1.3*    Cardiac Enzymes: Recent Labs  Lab 08/14/22 0617 08/16/22 2202  CKTOTAL 1,836* 450*    BNP (last 3 results) No results for input(s): "PROBNP" in the last 8760 hours. HbA1C: No results for input(s): "HGBA1C" in the last 72 hours. CBG: No results for input(s): "GLUCAP" in the last 168 hours. Lipid Profile: No results for input(s): "CHOL", "HDL", "LDLCALC", "TRIG", "CHOLHDL", "LDLDIRECT" in the last 72 hours. Thyroid Function Tests: No results for input(s): "TSH", "T4TOTAL", "FREET4", "T3FREE", "THYROIDAB" in the last 72 hours. Anemia Panel: Recent Labs    08/18/22 0829 08/19/22 0616  FERRITIN 673* 626*    Urine analysis:    Component Value Date/Time   COLORURINE YELLOW (A) 08/17/2022 0105   APPEARANCEUR CLEAR (A) 08/17/2022 0105   LABSPEC 1.010 08/17/2022 0105   PHURINE 7.0 08/17/2022 0105   GLUCOSEU NEGATIVE 08/17/2022 0105   HGBUR SMALL (A) 08/17/2022 0105   BILIRUBINUR NEGATIVE 08/17/2022 0105   KETONESUR NEGATIVE 08/17/2022 0105   PROTEINUR NEGATIVE 08/17/2022 0105   UROBILINOGEN 0.2 07/14/2009 1503   NITRITE NEGATIVE 08/17/2022 0105   LEUKOCYTESUR NEGATIVE 08/17/2022 0105   Sepsis Labs: '@LABRCNTIP'$ (procalcitonin:4,lacticidven:4)  Recent Results (from the past 240 hour(s))  Resp Panel by RT-PCR (Flu A&B, Covid) Anterior Nasal Swab     Status: Abnormal   Collection Time: 08/12/22  6:23 AM   Specimen: Anterior Nasal Swab  Result Value Ref Range Status    SARS Coronavirus 2 by RT PCR POSITIVE (A) NEGATIVE Final    Comment: (NOTE) SARS-CoV-2 target nucleic acids are DETECTED.  The SARS-CoV-2 RNA is generally detectable in upper respiratory specimens during the acute phase of infection. Positive results are indicative of the presence of the identified virus, but do not rule out bacterial infection or co-infection with  other pathogens not detected by the test. Clinical correlation with patient history and other diagnostic information is necessary to determine patient infection status. The expected result is Negative.  Fact Sheet for Patients: EntrepreneurPulse.com.au  Fact Sheet for Healthcare Providers: IncredibleEmployment.be  This test is not yet approved or cleared by the [place] FDA and  has been authorized for detection and/or diagnosis of SARS-CoV-2 by FDA under an Emergency Use Authorization (EUA).  This EUA will remain in effect (meaning this test can be used) for the duration of  the COVID-19 declaration under Section 564(b)(1) of the A ct, 21 U.S.C. section 360bbb-3(b)(1), unless the authorization is terminated or revoked sooner.     Influenza A by PCR NEGATIVE NEGATIVE Final   Influenza B by PCR NEGATIVE NEGATIVE Final    Comment: (NOTE) The Xpert Xpress SARS-CoV-2/FLU/RSV plus assay is intended as an aid in the diagnosis of influenza from Nasopharyngeal swab specimens and should not be used as a sole basis for treatment. Nasal washings and aspirates are unacceptable for Xpert Xpress SARS-CoV-2/FLU/RSV testing.  Fact Sheet for Patients: EntrepreneurPulse.com.au  Fact Sheet for Healthcare Providers: IncredibleEmployment.be  This test is not yet approved or cleared by the [place] FDA and has been authorized for detection and/or diagnosis of SARS-CoV-2 by FDA under an Emergency Use Authorization (EUA). This EUA will remain in effect  (meaning this test can be used) for the duration of the COVID-19 declaration under Section 564(b)(1) of the Act, 21 U.S.C. section 360bbb-3(b)(1), unless the authorization is terminated or revoked.  Performed at [facility], [address] [place], [place] [address]   Blood culture (routine x 2)     Status: Abnormal (Preliminary result)   Collection Time: 08/16/22 10:02 PM   Specimen: BLOOD  Result Value Ref Range Status   Specimen Description   Final    BLOOD LEFT HAND Performed at [facility], [address], [place], [place] [address]    Special Requests   Final    BOTTLES DRAWN AEROBIC AND ANAEROBIC Blood Culture results may not be optimal due to an inadequate volume of blood received in culture bottles Performed at [facility], [address], [place], [place] [address]    Culture  Setup Time   Final    GRAM POSITIVE COCCI ANAEROBIC BOTTLE ONLY GRAM STAIN REVIEWED-AGREE WITH RESULT CRITICAL RESULT CALLED TO, READ BACK BY AND VERIFIED WITH: NATHAN BELUE AT 2323 08/17/2022 GA Performed at [facility], [address], [place], [place] [address]    Culture STAPHYLOCOCCUS AUREUS (A)  Final   Report Status PENDING  Incomplete  Blood culture (routine x 2)     Status: Abnormal (Preliminary result)   Collection Time: 08/17/22  2:54 AM   Specimen: BLOOD RIGHT ARM  Result Value Ref Range Status   Specimen Description   Final    BLOOD RIGHT ARM Performed at [facility], [address] [place], [place] [address]    Special Requests   Final    BOTTLES DRAWN AEROBIC AND ANAEROBIC Blood Culture results may not be optimal due to an inadequate volume of blood received in culture bottles Performed at [facility], [place] [place],  [address]    Culture  Setup Time   Final    GRAM POSITIVE COCCI AEROBIC BOTTLE ONLY CRITICAL RESULT CALLED TO, READ BACK BY AND VERIFIED WITH: PHARMD BRANDON BEERS AT [place]  08/17/2022 GAA    Culture (A)  Final    STAPHYLOCOCCUS  AUREUS SUSCEPTIBILITIES TO FOLLOW Performed at [facility], [address] [place], [place] [address]    Report Status PENDING  Incomplete  Blood Culture ID Panel (Reflexed)     Status: Abnormal   Collection Time: 08/17/22  2:54 AM  Result Value Ref Range Status   Enterococcus faecalis NOT DETECTED NOT DETECTED Final   Enterococcus Faecium NOT DETECTED NOT DETECTED Final   Listeria monocytogenes NOT DETECTED NOT DETECTED Final   Staphylococcus species DETECTED (A) NOT DETECTED Final    Comment: CRITICAL RESULT CALLED TO, READ BACK BY AND VERIFIED WITH: PHARMD BRANDON BEERS AT 1930 08/17/2022 GAA    Staphylococcus aureus (BCID) DETECTED (A) NOT DETECTED Final    Comment: CRITICAL RESULT CALLED TO, READ BACK BY AND VERIFIED WITH: PHARMD BRANDON BEERS AT 1930 08/17/2022 GAA    Staphylococcus epidermidis NOT DETECTED NOT DETECTED Final   Staphylococcus lugdunensis NOT DETECTED NOT DETECTED Final   Streptococcus species NOT DETECTED NOT DETECTED Final   Streptococcus agalactiae NOT DETECTED NOT DETECTED Final   Streptococcus pneumoniae NOT DETECTED NOT DETECTED Final   Streptococcus pyogenes NOT DETECTED NOT DETECTED Final   A.calcoaceticus-baumannii NOT DETECTED NOT DETECTED Final   Bacteroides fragilis NOT DETECTED NOT DETECTED Final   Enterobacterales NOT DETECTED NOT DETECTED Final   Enterobacter cloacae complex NOT DETECTED NOT DETECTED Final   Escherichia coli NOT DETECTED NOT DETECTED Final   Klebsiella aerogenes NOT DETECTED NOT DETECTED Final   Klebsiella oxytoca NOT DETECTED NOT DETECTED Final   Klebsiella pneumoniae NOT DETECTED NOT DETECTED Final   Proteus species NOT DETECTED NOT DETECTED Final   Salmonella species NOT DETECTED NOT DETECTED Final   Serratia marcescens NOT DETECTED NOT DETECTED Final   Haemophilus influenzae NOT DETECTED NOT DETECTED Final   Neisseria meningitidis NOT DETECTED NOT DETECTED  Final   Pseudomonas aeruginosa NOT DETECTED NOT DETECTED Final   Stenotrophomonas maltophilia NOT DETECTED NOT DETECTED Final   Candida albicans NOT DETECTED NOT DETECTED Final   Candida auris NOT DETECTED NOT DETECTED Final   Candida glabrata NOT DETECTED NOT DETECTED Final   Candida krusei NOT DETECTED NOT DETECTED Final   Candida parapsilosis NOT DETECTED NOT DETECTED Final   Candida tropicalis NOT DETECTED NOT DETECTED Final   Cryptococcus neoformans/gattii NOT DETECTED NOT DETECTED Final   Meth resistant mecA/C and MREJ NOT DETECTED NOT DETECTED Final    Comment: Performed at [facility], [address] [place], [place] [address]         Radiology Studies: CT Angio Chest Pulmonary Embolism (PE) W or WO Contrast  Result Date: 08/17/2022 CLINICAL DATA:  Right-sided chest pain. Bruising along the left side of the chest. Hypotension. EXAM: CT ANGIOGRAPHY CHEST WITH CONTRAST TECHNIQUE: Multidetector CT imaging of the chest was performed using the standard protocol during bolus administration of intravenous contrast. Multiplanar CT image reconstructions and MIPs were obtained to evaluate the vascular anatomy. RADIATION DOSE REDUCTION: This exam was performed according to the departmental dose-optimization program which includes automated exposure control, adjustment of the mA and/or kV according to patient size and/or use of iterative reconstruction technique. CONTRAST:  89m OMNIPAQUE IOHEXOL 350 MG/ML SOLN COMPARISON:  Chest radiograph 08/16/2022 and chest CT 11/01/2016 FINDINGS: Despite efforts by the technologist and patient, motion artifact is present on today's exam and could not be eliminated. This reduces exam sensitivity and specificity. Cardiovascular: No filling defect is identified in the pulmonary arterial tree to suggest pulmonary embolus. Atherosclerotic calcification of the aortic arch. Mild cardiomegaly. Suspected left  anterior descending coronary artery  atherosclerotic calcification. Poor opacification of the systemic vasculature including the aorta. Mediastinum/Nodes: Unremarkable Lungs/Pleura: Nonspecific ground-glass opacities favoring the lung bases potentially reflecting edema or air trapping. More confluent airspace opacity in the right lower lobe, pneumonia not excluded. There is airway thickening in the right lower lobe and peripheral atelectasis in the posterior basal segment left lower lobe. Upper Abdomen: Fluid density Bosniak category 1 cyst of the right mid kidney medially, requiring no follow up. Hypodense lesion of the right kidney upper pole anteriorly on image 143 series 4 has an internal density of 16 Hounsfield units, favoring a benign Bosniak category 2 cyst. No further imaging workup of this lesion is indicated. Musculoskeletal: Bilateral total shoulder arthroplasties. Thoracic spondylosis. Review of the MIP images confirms the above findings. IMPRESSION: 1. No filling defect is identified in the pulmonary arterial tree to suggest pulmonary embolus. 2. Mild cardiomegaly with suspected left anterior descending coronary artery atherosclerotic calcification. 3. Nonspecific ground-glass opacities favoring the lung bases probably reflect mild pulmonary edema. 4. More confluent airspace opacity in the right lower lobe, pneumonia not excluded. 5. Airway thickening in the right lower lobe, query bronchitis or reactive airways disease. 6. Aortic atherosclerosis. Aortic Atherosclerosis (ICD10-I70.0). Electronically Signed   By: Van Clines M.D.   On: 08/17/2022 15:34   DG Chest Portable 1 View  Result Date: 08/16/2022 CLINICAL DATA:  Shortness of breath EXAM: PORTABLE CHEST 1 VIEW COMPARISON:  08/12/2022 FINDINGS: Bilateral shoulder replacements. Cardiomegaly with central congestion. Patchy atelectasis left base. No pleural effusion or pneumothorax. IMPRESSION: Cardiomegaly with mild central congestion, overall appearance improved since  08/12/2022. Patchy atelectasis left base. Electronically Signed   By: Donavan Foil M.D.   On: 08/16/2022 22:26            LOS: 1 day      Emeterio Reeve, DO [facility] 08/19/2022, 12:13 PM   Staff may message me via secure chat in [place]  but this may not receive immediate response,  please page for urgent matters!  If 7PM-7AM, please contact night-coverage [email or web address]  Dictation software was used to generate the above note. Typos may occur and escape review, as with typed/written notes. Please contact Dr Sheppard Coil directly for clarity if needed.

## 2022-08-20 ENCOUNTER — Inpatient Hospital Stay (HOSPITAL_COMMUNITY)
Admit: 2022-08-20 | Discharge: 2022-08-20 | Disposition: A | Discharge status: Home / Self Care | Payer: Medicare Other | Attending: Family Medicine | Admitting: Family Medicine

## 2022-08-20 DIAGNOSIS — I480 Paroxysmal atrial fibrillation: Secondary | ICD-10-CM

## 2022-08-20 DIAGNOSIS — R7881 Bacteremia: Secondary | ICD-10-CM | POA: Diagnosis not present

## 2022-08-20 DIAGNOSIS — R531 Weakness: Secondary | ICD-10-CM | POA: Diagnosis not present

## 2022-08-20 DIAGNOSIS — I4891 Unspecified atrial fibrillation: Secondary | ICD-10-CM

## 2022-08-20 DIAGNOSIS — U071 COVID-19: Secondary | ICD-10-CM | POA: Diagnosis not present

## 2022-08-20 DIAGNOSIS — B9561 Methicillin susceptible Staphylococcus aureus infection as the cause of diseases classified elsewhere: Secondary | ICD-10-CM | POA: Diagnosis not present

## 2022-08-20 LAB — CULTURE, BLOOD (ROUTINE X 2)

## 2022-08-20 LAB — CBC WITH DIFFERENTIAL/PLATELET
Abs Immature Granulocytes: 0.58 10*3/uL — ABNORMAL HIGH (ref 0.00–0.07)
Basophils Absolute: 0 10*3/uL (ref 0.0–0.1)
Basophils Relative: 0 %
Eosinophils Absolute: 0 10*3/uL (ref 0.0–0.5)
Eosinophils Relative: 0 %
HCT: 39.4 % (ref 39.0–52.0)
Hemoglobin: 13.6 g/dL (ref 13.0–17.0)
Immature Granulocytes: 6 %
Lymphocytes Relative: 6 %
Lymphs Abs: 0.6 10*3/uL — ABNORMAL LOW (ref 0.7–4.0)
MCH: 31.7 pg (ref 26.0–34.0)
MCHC: 34.5 g/dL (ref 30.0–36.0)
MCV: 91.8 fL (ref 80.0–100.0)
Monocytes Absolute: 1.1 10*3/uL — ABNORMAL HIGH (ref 0.1–1.0)
Monocytes Relative: 11 %
Neutro Abs: 8.2 10*3/uL — ABNORMAL HIGH (ref 1.7–7.7)
Neutrophils Relative %: 77 %
Platelets: 214 10*3/uL (ref 150–400)
RBC: 4.29 MIL/uL (ref 4.22–5.81)
RDW: 12.8 % (ref 11.5–15.5)
Smear Review: NORMAL
WBC: 10.6 10*3/uL — ABNORMAL HIGH (ref 4.0–10.5)
nRBC: 0 % (ref 0.0–0.2)

## 2022-08-20 LAB — SURGICAL PCR SCREEN
MRSA, PCR: NEGATIVE
Staphylococcus aureus: POSITIVE — AB

## 2022-08-20 LAB — COMPREHENSIVE METABOLIC PANEL
ALT: 54 U/L — ABNORMAL HIGH (ref 0–44)
AST: 37 U/L (ref 15–41)
Albumin: 2.5 g/dL — ABNORMAL LOW (ref 3.5–5.0)
Alkaline Phosphatase: 46 U/L (ref 38–126)
Anion gap: 6 (ref 5–15)
BUN: 37 mg/dL — ABNORMAL HIGH (ref 8–23)
CO2: 26 mmol/L (ref 22–32)
Calcium: 7.8 mg/dL — ABNORMAL LOW (ref 8.9–10.3)
Chloride: 105 mmol/L (ref 98–111)
Creatinine, Ser: 0.97 mg/dL (ref 0.61–1.24)
GFR, Estimated: >60 mL/min (ref >60)
Glucose, Bld: 156 mg/dL — ABNORMAL HIGH (ref 70–99)
Potassium: 3.9 mmol/L (ref 3.5–5.1)
Sodium: 137 mmol/L (ref 135–145)
Total Bilirubin: 0.9 mg/dL (ref 0.3–1.2)
Total Protein: 5.6 g/dL — ABNORMAL LOW (ref 6.5–8.1)

## 2022-08-20 LAB — C-REACTIVE PROTEIN: CRP: 1.3 mg/dL — ABNORMAL HIGH (ref <1.0)

## 2022-08-20 LAB — ECHOCARDIOGRAM COMPLETE
Height: 68 in
MV M vel: 5.07 m/s
MV Peak grad: 102.8 mmHg
S' Lateral: 3.7 cm
Weight: 3760 oz

## 2022-08-20 LAB — HEPARIN LEVEL (UNFRACTIONATED): Heparin Unfractionated: 0.43 IU/mL (ref 0.30–0.70)

## 2022-08-20 LAB — FERRITIN: Ferritin: 641 ng/mL — ABNORMAL HIGH (ref 24–336)

## 2022-08-20 MED ORDER — MUPIROCIN 2 % EX OINT
1.0000 | TOPICAL_OINTMENT | Freq: Two times a day (BID) | CUTANEOUS | Status: DC
  Administered 2022-08-20 – 2022-08-25 (×9): 1 via NASAL
  Filled 2022-08-20: qty 22

## 2022-08-20 MED ORDER — MUPIROCIN 2 % EX OINT
1.0000 | TOPICAL_OINTMENT | Freq: Two times a day (BID) | CUTANEOUS | Status: DC
  Filled 2022-08-20: qty 22

## 2022-08-20 MED ORDER — CHLORHEXIDINE GLUCONATE CLOTH 2 % EX PADS
6.0000 | MEDICATED_PAD | Freq: Every day | CUTANEOUS | Status: AC
  Administered 2022-08-20 – 2022-08-21 (×2): 6 via TOPICAL

## 2022-08-20 MED ORDER — PERFLUTREN LIPID MICROSPHERE
1.0000 mL | INTRAVENOUS | Status: AC | PRN
  Administered 2022-08-20: 2 mL via INTRAVENOUS

## 2022-08-20 NOTE — Progress Notes (Signed)
Physical Therapy Treatment Patient Details Name: Nicholas [name] MRN: [id number] DOB: 12-16-1950 Today's Date: 08/20/2022   History of Present Illness Nicholas [name] is a 56, past medical history CAD/MI, GERD, PFO, remote history of seizure, questionable history of stroke/CHF.  Presented to the 08/16/2022 from home via EMS for fall,  found to be COVID +    PT Comments    Pt in chair.  He is able to stand for standing AROM for BLE (No knee flexion R) x 10.  Pt with overall good standing tolerance but did endorse 4/10 dizziness in standing which resulted in need to sit.  BP WFL in sitting and documented in flow sheet by RN.  He declined offer to try to walk in room due to fatigue.  HE was able to walk with OT prior.  Will leave SNF recommendations.   Recommendations for follow up therapy are one component of a multi-disciplinary discharge planning process, led by the attending physician.  Recommendations may be updated based on patient status, additional functional criteria and insurance authorization.  Follow Up Recommendations  Skilled nursing-short term rehab (<3 hours/day)     Assistance Recommended at Discharge Intermittent Supervision/Assistance  Patient can return home with the following A little help with walking and/or transfers;A little help with bathing/dressing/bathroom;Assistance with cooking/housework;Assist for transportation;Help with stairs or ramp for entrance   Equipment Recommendations  None recommended by PT    Recommendations for Other Services       Precautions / Restrictions Precautions Precautions: Fall Restrictions Weight Bearing Restrictions: No     Mobility  Bed Mobility               General bed mobility comments: in chair before and after session    Transfers Overall transfer level: Needs assistance Equipment used: Rolling walker (2 wheels) Transfers: Sit to/from Stand Sit to Stand: Supervision                Ambulation/Gait                General Gait Details: gair deferred this session as pt fatigued and dizzy after standing ex.  BP good and documented in flow sheets by RN in room.   Stairs             Wheelchair Mobility    Modified Rankin (Stroke Patients Only)       Balance Overall balance assessment: Needs assistance Sitting-balance support: No upper extremity supported, Feet supported Sitting balance-Leahy Scale: Good     Standing balance support: Bilateral upper extremity supported Standing balance-Leahy Scale: Good                              Cognition Arousal/Alertness: Awake/alert Behavior During Therapy: WFL for tasks assessed/performed Overall Cognitive Status: Within Functional Limits for tasks assessed                                          Exercises Other Exercises Other Exercises: standing AROM with walker support for available ranges.    General Comments        Pertinent Vitals/Pain Pain Assessment Pain Assessment: No/denies pain    Home Living Family/patient expects to be discharged to:: Private residence (ILF) Living Arrangements: Alone Available Help at Discharge: Available PRN/intermittently;Family Type of Home: Independent living facility Home Access: Ramped entrance  Home Layout: One level Home Equipment: Rollator (4 wheels);Cane - single point;Electric scooter Additional Comments: [facility] [employer] [facility] for senior living.    Prior Function            PT Goals (current goals can now be found in the care plan section) Progress towards PT goals: Progressing toward goals    Frequency    Min 2X/week      PT Plan      Co-evaluation              AM-PAC PT "6 Clicks" Mobility   Outcome Measure  Help needed turning from your back to your side while in a flat bed without using bedrails?: None Help needed moving from lying on your back to sitting on the side of a flat bed without using  bedrails?: None Help needed moving to and from a bed to a chair (including a wheelchair)?: A Little Help needed standing up from a chair using your arms (e.g., wheelchair or bedside chair)?: A Little Help needed to walk in hospital room?: A Little Help needed climbing 3-5 steps with a railing? : A Lot 6 Click Score: 19    End of Session Equipment Utilized During Treatment: Oxygen Activity Tolerance: Treatment limited secondary to medical complications (Comment) Patient left: in chair;with chair alarm set;with call bell/phone within reach Nurse Communication: Mobility status (notified MD & nurse of BP) PT Visit Diagnosis: History of falling (Z91.81);Unsteadiness on feet (R26.81);Muscle weakness (generalized) (M62.81)     Time: 5409-8119 PT Time Calculation (min) (ACUTE ONLY): 18 min  Charges:  $Therapeutic Exercise: 8-22 mins                     {Holley Kocurek, PTA 08/20/22, 2:53 PM

## 2022-08-20 NOTE — Consult Note (Signed)
NAME: Nicholas [name]  DOB: [birth year]  MRN: [id number]  Date/Time: 08/20/2022 9:19 AM  REQUESTING PROVIDER Emeterio Reeve Subjective:  REASON FOR CONSULT: MSSA bacteremia ? Nicholas [name] is a 71 y.o. with a history of CKD, hypothyroidism,b/l knee and shoulder replacement, left great toe amputation, lumbar laminectomy for spinal stenosis, seizure disorder on depakote  Presents from home with multiple falls within 24 hrs after discharge on 08/16/22 He was in the hospital 10/29-11/1 /23 a for acute hypoxemic respiratory failure , acute COVID resp illness, Rhabdomyolysis, AKI-  No blood culture done that visit. He was treated with paxlovid,  THis time EMS saw him on the ground hypotensive with Bp 80s and tachycardic and stas 80% on room air. He wa splaced on NRB and brought to ED- in the ED he was awake and alert and c/o pain rt side of chest due to the fall  08/16/22  BP 141/85 !  Temp 98.1 F (36.7 C)  Pulse Rate 61  Resp 23 !  SpO2 96 %    Latest Reference Range & Units 08/16/22  WBC 4.0 - 10.5 K/uL 13.0 (H)  Hemoglobin 13.0 - 17.0 g/dL 14.7  HCT 39.0 - 52.0 % 44.4  Platelets 150 - 400 K/uL 168  Creatinine 0.61 - 1.24 mg/dL 1.16  Blood culture sent EKG AFIB CTA r/o PE- some non specific GGO Blood culture came back positive for MSS and over the weekend On call ID gave preliminary recommendations . I am seeing the patient for further management of MSSA bacteremia   Past Medical History:  Diagnosis Date   Acute kidney failure 08/2008   "cleared up"  no problems since   Allergy    Anxiety    Arthritis    Asthma    ?of this no inhaler   Blood transfusion    CHF (congestive heart failure) ([place])    ?of this, pt denies   CLOSTRIDIUM DIFFICILE COLITIS 07/04/2010   Annotation: 12/09, 2/10 Qualifier: Diagnosis of  By: [name], [name]     Depression with anxiety    Duodenitis determined by biopsy 02/2016   peptic likely due to aleve (erosive gastropathy with duodenal erosions)    ECZEMA 07/04/2010   Qualifier: Diagnosis of  By: [name], [name]     Elevated liver enzymes    Fatty liver    Full dentures    GERD (gastroesophageal reflux disease)    Heart attack ([place])    08/2008 (likely demand ischemia in the setting of MSSA sepsis/R TKA  infection)10-2008   History of hiatal hernia    History of stomach ulcers    Hypothyroidism    Lower GI bleeding    MALAR AND MAXILLARY BONES CLOSED FRACTURE 07/04/2010   Annotation: ORIF Qualifier: Diagnosis of  By: [name], [name]     Migraine    "definitely"   MRSA (methicillin resistant Staphylococcus aureus)    in leg, had to place steel rod in leg   Personal history of colonic adenoma 06/01/2003   PFO (patent foramen ovale)    small PFO by 08/2008 TEE   Pneumonia 08/2008   "while in ICU"   Seizures ([place]) 2009   "long time ago"     Sleep apnea    does not wear CPAP   Stroke ([place]) 2010   unable to complete sentences at times   Wears glasses     Past Surgical History:  Procedure Laterality Date   AMPUTATION Left 01/24/2022   Procedure: LEFT GREAT TOE  AMPUTATION AT METATARSOPHALANGEAL JOINT AND SECOND TOE AMPUTATION;  Surgeon: Newt Minion, MD;  Location: [facility];  Service: Orthopedics;  Laterality: Left;   anterior  nerve transposition  07/2009   left ulnar nerve   antibiotic spacer exchange  11/2008; 08/2006   right knee   ARTHROTOMY  08/2008   right knee w/I&D   CARDIOVASCULAR STRESS TEST  2013   stress EKG - negative for ischemia, 4 min 7.3 METs, normal blood pressure response   CATARACT EXTRACTION     COLONOSCOPY  11/2012   diverticulosis, rpt 10 yrs [name])   ESOPHAGOGASTRODUODENOSCOPY  02/2016   erosive gastropathy with duodenal erosions [name])   HARDWARE REMOVAL  04/2006   right knee w/antibiotic spacers placed   INGUINAL HERNIA REPAIR  early 1990's   bilateral   JOINT REPLACEMENT Bilateral    KNEE ARTHROSCOPY  10/2001   right   KNEE FUSION  03/2009   right knee removal; antibiotic spacers;     LEFT HEART CATH AND CORONARY ANGIOGRAPHY N/A 02/04/2018    WNL (Patwardhan, Reynold Bowen, MD)   LUMBAR LAMINECTOMY/DECOMPRESSION MICRODISCECTOMY N/A 12/05/2018   Procedure: LUMBAR THREE TO FOUR AND LUMBAR FOUR TO FIVE DECOMPRESSION;  Surgeon: [name], MD;  Location: [facility];  Service: Orthopedics;  Laterality: N/A;   MULTIPLE TOOTH EXTRACTIONS     REPLACEMENT TOTAL KNEE  08/2008; 09/2001   right   REVERSE SHOULDER ARTHROPLASTY Left 09/03/2017   Procedure: REVERSE SHOULDER ARTHROPLASTY;  Surgeon: Meredith Pel, MD;  Location: [facility];  Service: Orthopedics;  Laterality: Left;   REVERSE SHOULDER ARTHROPLASTY Right 05/13/2018   Procedure: RIGHT REVERSE SHOULDER ARTHROPLASTY;  Surgeon: Meredith Pel, MD;  Location: [facility];  Service: Orthopedics;  Laterality: Right;   rod placement Right 03/2009   R knee   SYNOVECTOMY  06/2005   debridement, liner exchange right knee   TOE AMPUTATION Left 08/2008   great toe - osteomyelitis (staph infection)   TOE AMPUTATION Left 04/2016   2nd toe [name]   TONSILLECTOMY     TOTAL KNEE ARTHROPLASTY Left 01/15/2017   TOTAL KNEE ARTHROPLASTY Left 01/15/2017   Procedure: TOTAL KNEE ARTHROPLASTY;  Surgeon: Meredith Pel, MD;  Location: [facility];  Service: Orthopedics;  Laterality: Left;    Social History   Socioeconomic History   Marital status: Widowed    Spouse name: Not on file   Number of children: 2   Years of education: 28   Highest education level: Not on file  Occupational History   Occupation: retired  Tobacco Use   Smoking status: Never    Passive exposure: Past   Smokeless tobacco: Never  Vaping Use   Vaping Use: Never used  Substance and Sexual Activity   Alcohol use: No    Alcohol/week: 0.0 standard drinks of alcohol    Comment: "I abused alcohol; last drink  ~ 2005"   Drug use: No   Sexual activity: Not Currently  Other Topics Concern   Not on file  Social History Narrative   Widower 2016 - wife passed from ESRD related  illness   Lives in [place] in [place].    Mormon      Screened positive for OSA 04/2018   Social Determinants of Health   Financial Resource Strain: Medium Risk (02/28/2022)   Overall Financial Resource Strain (CARDIA)    Difficulty of Paying Living Expenses: Somewhat hard  Food Insecurity: No Food Insecurity (08/17/2022)   Hunger Vital Sign    Worried  About Running Out of Food in the Last Year: Never true    Ran Out of Food in the Last Year: Never true  Transportation Needs: No Transportation Needs (08/17/2022)   PRAPARE - Hydrologist (Medical): No    Lack of Transportation (Non-Medical): No  Physical Activity: Insufficiently Active (02/28/2022)   Exercise Vital Sign    Days of Exercise per Week: 2 days    Minutes of Exercise per Session: 20 min  Stress: No Stress Concern Present (02/28/2022)   [place]    Feeling of Stress : Not at all  Social Connections: Moderately Isolated (02/28/2022)   Social Connection and Isolation Panel [NHANES]    Frequency of Communication with Friends and Family: More than three times a week    Frequency of Social Gatherings with Friends and Family: Once a week    Attends Religious Services: More than 4 times per year    Active Member of [employer] or Organizations: No    Attends Archivist Meetings: Never    Marital Status: Widowed  Intimate Partner Violence: Not At Risk (08/17/2022)   Humiliation, Afraid, Rape, and Kick questionnaire    Fear of Current or Ex-Partner: No    Emotionally Abused: No    Physically Abused: No    Sexually Abused: No    Family History  Problem Relation Age of Onset   Coronary artery disease Father 32   Stroke Father    CAD Mother 77       stents   Breast cancer Sister    Diabetes Brother    Diabetes Sister    Colon cancer Neg Hx    Stomach cancer Neg Hx    Allergies  Allergen Reactions    Peanut-Containing Drug Products Anaphylaxis and Dermatitis   Yellow Jacket Venom [Bee Venom] Anaphylaxis and Other (See Comments)    Respiratory Distress   Celebrex [Celecoxib] Other (See Comments)    BLACK STOOL?MELENA?   Percocet [Oxycodone-Acetaminophen] Hives, Swelling and Other (See Comments)   Latex Rash   Nsaids Rash   I? Current Facility-Administered Medications  Medication Dose Route Frequency Provider Last Rate Last Admin   ascorbic acid (VITAMIN C) tablet 500 mg  500 mg Oral Daily Mansy, Jan A, MD   500 mg at 08/19/22 0815   B-complex with vitamin C tablet 1 tablet  1 tablet Oral Daily Mansy, Jan A, MD   1 tablet at 08/19/22 0817   ceFAZolin (ANCEF) IVPB 2g/100 mL premix  2 g Intravenous Q8H Sharion Settler, NP 200 mL/hr at 08/20/22 0601 2 g at 08/20/22 0601   chlorpheniramine-HYDROcodone (TUSSIONEX) 10-8 MG/5ML suspension 5 mL  5 mL Oral Q12H PRN Mansy, Jan A, MD       cholecalciferol (VITAMIN D3) tablet 1,000 Units  1,000 Units Oral Daily Mansy, Jan A, MD   1,000 Units at 08/19/22 0815   divalproex (DEPAKOTE ER) 24 hr tablet 500 mg  500 mg Oral Daily Mansy, Jan A, MD   500 mg at 08/19/22 0817   EPINEPHrine (EPI-PEN) injection 0.3 mg  0.3 mg Intramuscular PRN Mansy, Jan A, MD       ferrous sulfate tablet 325 mg  325 mg Oral Q breakfast Mansy, Jan A, MD   325 mg at 08/19/22 0816   fluticasone (FLONASE) 50 MCG/ACT nasal spray 2 spray  2 spray Each Nare Daily Mansy, Jan A, MD   2 spray at 08/19/22 [phone]  furosemide (LASIX) tablet 20 mg  20 mg Oral q morning Mansy, Jan A, MD   20 mg at 08/19/22 0816   gabapentin (NEURONTIN) capsule 300 mg  300 mg Oral Q breakfast Renda Rolls, RPH   300 mg at 08/19/22 8341   And   gabapentin (NEURONTIN) capsule 600 mg  600 mg Oral BID Renda Rolls, RPH   600 mg at 08/19/22 2150   guaiFENesin-dextromethorphan (ROBITUSSIN DM) 100-10 MG/5ML syrup 10 mL  10 mL Oral Q4H PRN Mansy, Jan A, MD   10 mL at 08/18/22 1014   heparin ADULT infusion 100  units/mL (25000 units/282m)  1,350 Units/hr Intravenous Continuous MAlison Murray RPH 13.5 mL/hr at 08/19/22 2311 1,350 Units/hr at 08/19/22 2311   influenza vaccine adjuvanted (FLUAD) injection 0.5 mL  0.5 mL Intramuscular Tomorrow-1000 AEmeterio Reeve DO       levothyroxine (SYNTHROID) tablet 112 mcg  112 mcg Oral Q0600 Mansy, Jan A, MD   112 mcg at 08/20/22 0601   loratadine (CLARITIN) tablet 10 mg  10 mg Oral Daily Mansy, Jan A, MD   10 mg at 08/19/22 0816   magnesium hydroxide (MILK OF MAGNESIA) suspension 30 mL  30 mL Oral Daily PRN Mansy, Jan A, MD       methocarbamol (ROBAXIN) tablet 500 mg  500 mg Oral Q8H PRN Mansy, Jan A, MD       metoprolol succinate (TOPROL-XL) 24 hr tablet 100 mg  100 mg Oral Daily Mansy, Jan A, MD   100 mg at 08/19/22 0816   nitroGLYCERIN (NITROSTAT) SL tablet 0.4 mg  0.4 mg Sublingual Q5 min PRN Mansy, Jan A, MD       ondansetron ([facility] tablet 4 mg  4 mg Oral Q6H PRN Mansy, Jan A, MD       Or   ondansetron ([facility] injection 4 mg  4 mg Intravenous Q6H PRN Mansy, Jan A, MD       pantoprazole (PROTONIX) EC tablet 40 mg  40 mg Oral QODAY Mansy, Jan A, MD   40 mg at 08/19/22 0815   predniSONE (DELTASONE) tablet 50 mg  50 mg Oral Daily Mansy, Jan A, MD       promethazine (PHENERGAN) suppository 25 mg  25 mg Rectal Q6H PRN AEmeterio Reeve DO       tamsulosin ([facility] capsule 0.4 mg  0.4 mg Oral Daily Mansy, Jan A, MD   0.4 mg at 08/19/22 0815   tiZANidine (ZANAFLEX) tablet 4 mg  4 mg Oral Q6H PRN Mansy, Jan A, MD   4 mg at 08/19/22 0545   traZODone (DESYREL) tablet 25 mg  25 mg Oral QHS PRN Mansy, Jan A, MD       zinc sulfate capsule 220 mg  220 mg Oral Daily Mansy, Jan A, MD   220 mg at 08/19/22 0815   Facility-Administered Medications Ordered in Other Encounters  Medication Dose Route Frequency Provider Last Rate Last Admin   perflutren lipid microspheres (DEFINITY) IV suspension  1-10 mL Intravenous PRN Mansy, JArvella Merles MD         Abtx:   Anti-infectives (From admission, onward)    Start     Dose/Rate Route Frequency Ordered Stop   08/17/22 2030  ceFAZolin (ANCEF) IVPB 2g/100 mL premix        2 g 200 mL/hr over 30 Minutes Intravenous Every 8 hours 08/17/22 1944     08/17/22 0130  nirmatrelvir/ritonavir EUA (renal dosing) (PAXLOVID) 2 tablet  Status:  Discontinued       Note to Pharmacy: Take nirmatrelvir (150 mg) one tablet twice daily for 5 days and ritonavir (100 mg) one tablet twice daily for 5 days.     2 tablet Oral 2 times daily 08/16/22 2348 08/18/22 1210       REVIEW OF SYSTEMS:  Const: no fever, negative chills, negative weight loss Eyes: negative diplopia or visual changes, negative eye pain ENT: negative coryza, negative sore throat Resp:  cough,  dyspnea Cards: rt sided  chest pain, no palpitations, lower extremity edema GU: negative for frequency, dysuria and hematuria GI: Negative for abdominal pain, diarrhea, bleeding, constipation Skin: negative for rash and pruritus Heme:bruising  MS: weakness legs,unable to walk properly Neurolo recurrent falls Psych: anxiety, depression  Endocrine: negative for thyroid, diabetes Allergy/Immunology- multiple allergies Objective:  VITALS:  BP 121/79   Pulse 68   Temp 98.4 F (36.9 C) (Oral)   Resp 20   Ht '5\' 8"'$  (1.727 m)   Wt 106.6 kg   SpO2 99%   BMI 35.73 kg/m   PHYSICAL EXAM:  General: Alert, cooperative, no distress, appears stated age.  Sitting in chair- no distress Head: Normocephalic, without obvious abnormality, atraumatic. Eyes: Conjunctivae clear, anicteric sclerae. Pupils are equal ENT Nares normal. No drainage or sinus tenderness. Lips, mucosa, and tongue normal. No Thrush Neck: Supple, symmetrical, no adenopathy, thyroid: non tender no carotid bruit and no JVD. Back: No CVA tenderness. Lungs: b/l air entry. Heart: irregular controlled. Abdomen: Soft, non-tender,not distended. Bowel sounds normal. No masses Extremities: atraumatic,  no cyanosis. No edema. No clubbing Skin: No rashes or lesions. Or bruising Lymph: Cervical, supraclavicular normal. Neurologic: did not examine in detail Pertinent Labs Lab Results CBC    Component Value Date/Time   WBC 10.6 (H) 08/20/2022 0542   RBC 4.29 08/20/2022 0542   HGB 13.6 08/20/2022 0542   HGB 15.8 01/16/2022 1415   HCT 39.4 08/20/2022 0542   HCT 45.5 01/16/2022 1415   PLT 214 08/20/2022 0542   PLT 213 01/16/2022 1415   MCV 91.8 08/20/2022 0542   MCV 92 01/16/2022 1415   MCH 31.7 08/20/2022 0542   MCHC 34.5 08/20/2022 0542   RDW 12.8 08/20/2022 0542   RDW 12.6 01/16/2022 1415   LYMPHSABS 0.6 (L) 08/20/2022 0542   LYMPHSABS 1.6 01/16/2022 1415   MONOABS 1.1 (H) 08/20/2022 0542   EOSABS 0.0 08/20/2022 0542   EOSABS 0.3 01/16/2022 1415   BASOSABS 0.0 08/20/2022 0542   BASOSABS 0.1 01/16/2022 1415       Latest Ref Rng & Units 08/20/2022    5:42 AM 08/19/2022    6:16 AM 08/18/2022    8:29 AM  CMP  Glucose 70 - 99 mg/dL 156  178  163   BUN 8 - 23 mg/dL 37  27  27   Creatinine 0.61 - 1.24 mg/dL 0.97  0.90  0.90   Sodium 135 - 145 mmol/L 137  137  139   Potassium 3.5 - 5.1 mmol/L 3.9  4.0  4.1   Chloride 98 - 111 mmol/L 105  103  107   CO2 22 - 32 mmol/L '26  26  26   '$ Calcium 8.9 - 10.3 mg/dL 7.8  7.8  7.7   Total Protein 6.5 - 8.1 g/dL 5.6  5.7  5.9   Total Bilirubin 0.3 - 1.2 mg/dL 0.9  1.0  0.8   Alkaline Phos 38 - 126 U/L 46  47  47   AST 15 -  41 U/L 37  29  26   ALT 0 - 44 U/L 54  41  34       Microbiology: Recent Results (from the past 240 hour(s))  Resp Panel by RT-PCR (Flu A&B, Covid) Anterior Nasal Swab     Status: Abnormal   Collection Time: 08/12/22  6:23 AM   Specimen: Anterior Nasal Swab  Result Value Ref Range Status   SARS Coronavirus 2 by RT PCR POSITIVE (A) NEGATIVE Final    Comment: (NOTE) SARS-CoV-2 target nucleic acids are DETECTED.  The SARS-CoV-2 RNA is generally detectable in upper respiratory specimens during the acute phase of  infection. Positive results are indicative of the presence of the identified virus, but do not rule out bacterial infection or co-infection with other pathogens not detected by the test. Clinical correlation with patient history and other diagnostic information is necessary to determine patient infection status. The expected result is Negative.  Fact Sheet for Patients: EntrepreneurPulse.com.au  Fact Sheet for Healthcare Providers: IncredibleEmployment.be  This test is not yet approved or cleared by the [place] FDA and  has been authorized for detection and/or diagnosis of SARS-CoV-2 by FDA under an Emergency Use Authorization (EUA).  This EUA will remain in effect (meaning this test can be used) for the duration of  the COVID-19 declaration under Section 564(b)(1) of the A ct, 21 U.S.C. section 360bbb-3(b)(1), unless the authorization is terminated or revoked sooner.     Influenza A by PCR NEGATIVE NEGATIVE Final   Influenza B by PCR NEGATIVE NEGATIVE Final    Comment: (NOTE) The Xpert Xpress SARS-CoV-2/FLU/RSV plus assay is intended as an aid in the diagnosis of influenza from Nasopharyngeal swab specimens and should not be used as a sole basis for treatment. Nasal washings and aspirates are unacceptable for Xpert Xpress SARS-CoV-2/FLU/RSV testing.  Fact Sheet for Patients: EntrepreneurPulse.com.au  Fact Sheet for Healthcare Providers: IncredibleEmployment.be  This test is not yet approved or cleared by the [place] FDA and has been authorized for detection and/or diagnosis of SARS-CoV-2 by FDA under an Emergency Use Authorization (EUA). This EUA will remain in effect (meaning this test can be used) for the duration of the COVID-19 declaration under Section 564(b)(1) of the Act, 21 U.S.C. section 360bbb-3(b)(1), unless the authorization is terminated or revoked.  Performed at [facility], [address] [place], [place] [address]   Blood culture (routine x 2)     Status: Abnormal   Collection Time: 08/16/22 10:02 PM   Specimen: BLOOD  Result Value Ref Range Status   Specimen Description   Final    BLOOD LEFT HAND Performed at [facility], [address] [place], [place] [address]    Special Requests   Final    BOTTLES DRAWN AEROBIC AND ANAEROBIC Blood Culture results may not be optimal due to an inadequate volume of blood received in culture bottles Performed at [facility], [address], [place], [place] [address]    Culture  Setup Time   Final    GRAM POSITIVE COCCI ANAEROBIC BOTTLE ONLY GRAM STAIN REVIEWED-AGREE WITH RESULT CRITICAL RESULT CALLED TO, READ BACK BY AND VERIFIED WITH: NATHAN BELUE AT 2323 08/17/2022 GA Performed at [facility], [address] [place], [place] [address]    Culture (A)  Final    STAPHYLOCOCCUS AUREUS SUSCEPTIBILITIES PERFORMED ON PREVIOUS CULTURE WITHIN THE LAST 5 DAYS. Performed at [facility], [place] [address], [place], [place] [address]    Report  Status 08/20/2022 FINAL  Final  Blood culture (routine x 2)     Status: Abnormal   Collection Time: 08/17/22  2:54 AM   Specimen: BLOOD RIGHT ARM  Result Value Ref Range Status   Specimen Description   Final    BLOOD RIGHT ARM Performed at [facility], [address] [place], [place] [address]    Special Requests   Final    BOTTLES DRAWN AEROBIC AND ANAEROBIC Blood Culture results may not be optimal due to an inadequate volume of blood received in culture bottles Performed at [facility], [address] [place], [place] [address]    Culture  Setup Time   Final    GRAM POSITIVE COCCI AEROBIC BOTTLE ONLY CRITICAL RESULT CALLED TO, READ BACK BY AND VERIFIED WITH: PHARMD BRANDON BEERS AT [place] 08/17/2022 GAA Performed at [facility], [place] [address], [place], [place] [address]    Culture  STAPHYLOCOCCUS AUREUS (A)  Final   Report Status 08/20/2022 FINAL  Final   Organism ID, Bacteria STAPHYLOCOCCUS AUREUS  Final      Susceptibility   Staphylococcus aureus - MIC*    CIPROFLOXACIN <=0.5 SENSITIVE Sensitive     ERYTHROMYCIN <=0.25 SENSITIVE Sensitive     GENTAMICIN <=0.5 SENSITIVE Sensitive     OXACILLIN 0.5 SENSITIVE Sensitive     TETRACYCLINE <=1 SENSITIVE Sensitive     VANCOMYCIN <=0.5 SENSITIVE Sensitive     TRIMETH/SULFA <=10 SENSITIVE Sensitive     CLINDAMYCIN <=0.25 SENSITIVE Sensitive     RIFAMPIN <=0.5 SENSITIVE Sensitive     Inducible Clindamycin NEGATIVE Sensitive     * STAPHYLOCOCCUS AUREUS  Blood Culture ID Panel (Reflexed)     Status: Abnormal   Collection Time: 08/17/22  2:54 AM  Result Value Ref Range Status   Enterococcus faecalis NOT DETECTED NOT DETECTED Final   Enterococcus Faecium NOT DETECTED NOT DETECTED Final   Listeria monocytogenes NOT DETECTED NOT DETECTED Final   Staphylococcus species DETECTED (A) NOT DETECTED Final    Comment: CRITICAL RESULT CALLED TO, READ BACK BY AND VERIFIED WITH: PHARMD BRANDON BEERS AT 1930 08/17/2022 GAA    Staphylococcus aureus (BCID) DETECTED (A) NOT DETECTED Final    Comment: CRITICAL RESULT CALLED TO, READ BACK BY AND VERIFIED WITH: PHARMD BRANDON BEERS AT 1930 08/17/2022 GAA    Staphylococcus epidermidis NOT DETECTED NOT DETECTED Final   Staphylococcus lugdunensis NOT DETECTED NOT DETECTED Final   Streptococcus species NOT DETECTED NOT DETECTED Final   Streptococcus agalactiae NOT DETECTED NOT DETECTED Final   Streptococcus pneumoniae NOT DETECTED NOT DETECTED Final   Streptococcus pyogenes NOT DETECTED NOT DETECTED Final   A.calcoaceticus-baumannii NOT DETECTED NOT DETECTED Final   Bacteroides fragilis NOT DETECTED NOT DETECTED Final   Enterobacterales NOT DETECTED NOT DETECTED Final   Enterobacter cloacae complex NOT DETECTED NOT DETECTED Final   Escherichia coli NOT DETECTED NOT DETECTED Final    Klebsiella aerogenes NOT DETECTED NOT DETECTED Final   Klebsiella oxytoca NOT DETECTED NOT DETECTED Final   Klebsiella pneumoniae NOT DETECTED NOT DETECTED Final   Proteus species NOT DETECTED NOT DETECTED Final   Salmonella species NOT DETECTED NOT DETECTED Final   Serratia marcescens NOT DETECTED NOT DETECTED Final   Haemophilus influenzae NOT DETECTED NOT DETECTED Final   Neisseria meningitidis NOT DETECTED NOT DETECTED Final   Pseudomonas aeruginosa NOT DETECTED NOT DETECTED Final   Stenotrophomonas maltophilia NOT DETECTED NOT DETECTED Final   Candida albicans NOT DETECTED NOT DETECTED Final   Candida auris  NOT DETECTED NOT DETECTED Final   Candida glabrata NOT DETECTED NOT DETECTED Final   Candida krusei NOT DETECTED NOT DETECTED Final   Candida parapsilosis NOT DETECTED NOT DETECTED Final   Candida tropicalis NOT DETECTED NOT DETECTED Final   Cryptococcus neoformans/gattii NOT DETECTED NOT DETECTED Final   Meth resistant mecA/C and MREJ NOT DETECTED NOT DETECTED Final    Comment: Performed at [facility], [address] [place], [place] [address]   EKG- AFB    IMAGING RESULTS:  I have personally reviewed the films ?Ground glass appearance in both lower lobes Rt lower lobe  infiltrate  08/17/22 Cardiomegaly , pulmonary  congestion improved compared to 08/12/22  Impression/Recommendation  ?71 yr male was hospitalized on 08/12/22 with weakness, acute hypoxemia, covid, metabolic encephalopathy nd was discharged on 11/1 on paxlovid and readmitted on 11.2 with multiple falls Staph aureus bacteremia Blood culture was not checked the first admission so the bacteremia could have been there since them In the past patient has been colonized with MSSA/MRSA Recent Covid could have put him at risk R/o MSSA pneumonia Has Afib Will need to r/o endocarditis 2 d echo pending TEE cannot be done for atleast 10 days ( 08/22/22) because of recent covid Continue cefazolin Await  repeat blood culture MSSA nares positive- send sputum for culture  Duration and route of antibiotic will be based on repeat culture/TEE  AFIB with [facility]- cardiology on board CHF Multiple falls- bruising chest  COVID illness- Ct value was 22 from the test done on 08/12/22 completing paxlovid today- on high dose steroids- prednisone '50mg'$  ? Is it still needed  AKI on previous admission resolved  Check MSSA nares ?  Rhabdomyolysis from previous admission- improved   Hypothyroidism on synthroid?  Seizure disorder  Migraine   ___________________________________________________ Discussed with patient, requesting provider Dr.Wallace will be covering from tomorrow

## 2022-08-20 NOTE — Progress Notes (Signed)
PROGRESS NOTE    MARCIA LEPERA   ZOX:[id number] DOB: [birth year]  DOA: 08/16/2022 Date of Service: 08/20/22 PCP: [name], MD     Brief Narrative / Hospital Course:  [name] is a 32, past medical history CAD/MI, GERD, PFO, remote history of seizure, questionable history of stroke/CHF.  Presented to the 08/16/2022 from home via EMS for fall, per EMS patient was hypotensive with systolic in 81X, tachycardic in the 140s, saturating 80% on room air. 11/02-11/03: In ED, initial BP 167, SPO2 100%), taper down to 2L [place], EKG A-fib RVR rate 117. Labs borderline potassium of 3.5 and a BUN of 27 with albumin 3.3, AST 65 and ALT 48.  Total CK was 450 and high sensitive troponin I was 56.  CBC showed leukocytosis of 13 with neutrophilia.  Blood cultures were drawn.  UA was negative.  COVID-positive.  CXR cardiomegaly with mild central congestion.  Treated with Zofran, 1 L normal saline.  Admitted to hospitalist service.  Cardiology consult placed by admitting hospitalist. 11/03: cardiology saw patient, recommended eval for PE - CTA Chest negative for PE, (+)mild pulmonary edema, RLL possible pneumonia / airway thickening, CAD and aortic atherosclerosis, cardiomegaly. Remains weak, on O2.  11/04: (+)BCx x2 for MSSA, started on Cefazolin IV, repeat BCx tomorrow, plan for ID f/u Monday (today is Saturday). Finished Paxlovid. Work on weaning off O2. Echo pending.  11/05: pt reports migraine, typically takes depakote for prevention which he has been getting. Hypertensive. Added labetalol IV prn, ok for phenergan for migraine. See A/P.  11/06: VSS, migraine resolved. ID to see today.   Consultants:  Cardiology   Procedures: none      ASSESSMENT & PLAN:   Principal Problem:   Generalized weakness Active Problems:   Chronic diastolic CHF (congestive heart failure) (HCC)   Hypothyroidism   Dyslipidemia   Seizure disorder (HCC)   Atrial fibrillation with rapid ventricular response  ([place])   Fall   COVID-19   COVID-19 w/ acute hypoxic respiratory failure and pneumonia Supplemental O2 Completed Paxlovid Steroids   Atrial Fibrillation w/ RVR CHADS2VASc 3-4 (age x 1, ?CHF, CVA x 2)  Cardiology following Rate control w/ metoprolol Heparin gtt per cardiology to transition to [place] Echo done, pending read  If ventricular rates remain well controlled, would favor DCCV in the outpatient setting after he has been adequately anticoagulated, without interruption, for at least 4 weeks, and after he has recovered from his acute illness   Hypotension Resolve s/p IV fluids   Elevated high sensitivity troponin: Likely demand ischemia in the setting of recent Covid infection and Afib with RVR LHC in 2019 showed no significant CAD Cardiology following  Echo pending, if preserved LVSF no plans for inpatient ischemic evaluation Consider outpatient coronary CTA  Bacteremia likely MSSA (+)BCx x2 started on Cefazolin IV 08/18/22  repeat BCx 11/05 plan for ID f/u Dr Delaine Lame Monday 11/6 Echo already done, pending read  Will need TEE   Migraine Continue depakote ppx NSAID allergy - can't have this HTN - can't safely given triptan until BP improved Already on steroids  Ordered phenergan Consider Fioricet   Generalized weakness and fall PT/OT   Hypothyroidism Synthroid.   Seizure disorder ([place]) Depakote DR.   Dyslipidemia hold off statin therapy given elevated CK level which could be related to mild rhabdomyolysis vs COVID inflammation   DVT prophylaxis: Heparin --> DOAC planned  Pertinent IV fluids/nutrition: stopped IV NS today  Central lines / invasive devices: none  Code  Status: FULL CODE Family Communication: pt declined call to support persons today   Disposition: inpatient TOC needs: will need SNF  Barriers to discharge / significant pending items: bacteremia              Subjective:  Patient reports feeling okay today, no SOB, still  some coughing. NO fever/chills        Objective:  Vitals:   08/19/22 2153 08/20/22 0057 08/20/22 0600 08/20/22 1246  BP: 102/63 126/83 121/79 96/66  Pulse: 62 68  77  Resp:  '18 20 18  '$ Temp: 98.3 F (36.8 C) 98.4 F (36.9 C)  97.8 F (36.6 C)  TempSrc: Oral Oral  Oral  SpO2: 95% 96% 99% 96%  Weight:      Height:       No intake or output data in the 24 hours ending 08/20/22 1315  Filed Weights   08/17/22 0832  Weight: 106.6 kg    Examination: Constitutional:  VS as above General Appearance: alert, well-developed, well-nourished, NAD, eating breakfast and watching football game.  Respiratory: Normal respiratory effort No wheeze No rales Cardiovascular: S1/S2 normal Irreg/irreg Rate WNL Gastrointestinal: No tenderness Musculoskeletal:  No clubbing/cyanosis of digits Symmetrical movement in all extremities Neurological: No cranial nerve deficit on limited exam Alert Psychiatric: Normal judgment/insight Normal mood and affect       Scheduled Medications:   vitamin C  500 mg Oral Daily   B-complex with vitamin C  1 tablet Oral Daily   cholecalciferol  1,000 Units Oral Daily   divalproex  500 mg Oral Daily   ferrous sulfate  325 mg Oral Q breakfast   fluticasone  2 spray Each Nare Daily   furosemide  20 mg Oral q morning   gabapentin  300 mg Oral Q breakfast   And   gabapentin  600 mg Oral BID   influenza vaccine adjuvanted  0.5 mL Intramuscular Tomorrow-1000   levothyroxine  112 mcg Oral Q0600   loratadine  10 mg Oral Daily   metoprolol succinate  100 mg Oral Daily   mupirocin ointment  1 Application Nasal BID   pantoprazole  40 mg Oral QODAY   predniSONE  50 mg Oral Daily   tamsulosin  0.4 mg Oral Daily   zinc sulfate  220 mg Oral Daily    Continuous Infusions:   ceFAZolin (ANCEF) IV 2 g (08/20/22 0601)   heparin 1,350 Units/hr (08/19/22 2311)    PRN Medications:  chlorpheniramine-HYDROcodone, EPINEPHrine, guaiFENesin-dextromethorphan,  magnesium hydroxide, methocarbamol, nitroGLYCERIN, ondansetron **OR** ondansetron (ZOFRAN) IV, promethazine, tiZANidine, traZODone  Antimicrobials:  Anti-infectives (From admission, onward)    Start     Dose/Rate Route Frequency Ordered Stop   08/17/22 2030  ceFAZolin (ANCEF) IVPB 2g/100 mL premix        2 g 200 mL/hr over 30 Minutes Intravenous Every 8 hours 08/17/22 1944     08/17/22 0130  nirmatrelvir/ritonavir EUA (renal dosing) (PAXLOVID) 2 tablet  Status:  Discontinued       Note to Pharmacy: Take nirmatrelvir (150 mg) one tablet twice daily for 5 days and ritonavir (100 mg) one tablet twice daily for 5 days.     2 tablet Oral 2 times daily 08/16/22 2348 08/18/22 1210       Data Reviewed: I have personally reviewed following labs and imaging studies  CBC: Recent Labs  Lab 08/16/22 2202 08/17/22 0623 08/18/22 0829 08/19/22 0616 08/20/22 0542  WBC 13.0* 10.1 8.6 8.1 10.6*  NEUTROABS 10.9* 8.8* 7.3 6.9 8.2*  HGB 14.7 13.4 13.2 13.9 13.6  HCT 44.4 39.5 38.9* 41.1 39.4  MCV 95.9 94.0 94.0 93.4 91.8  PLT 168 168 183 209 244    Basic Metabolic Panel: Recent Labs  Lab 08/14/22 0617 08/16/22 2202 08/17/22 0224 08/18/22 0829 08/19/22 0616 08/20/22 0542  NA 141 138 139 139 137 137  K 4.8 3.5 3.9 4.1 4.0 3.9  CL 107 102 105 107 103 105  CO2 '26 22 25 26 26 26  '$ GLUCOSE 127* 125* 123* 163* 178* 156*  BUN 29* 27* 25* 27* 27* 37*  CREATININE 1.10 1.16 0.92 0.90 0.90 0.97  CALCIUM 8.5* 8.1* 7.8* 7.7* 7.8* 7.8*  MG 2.1  --   --   --  2.2  --     GFR: Estimated Creatinine Clearance: 82.7 mL/min (by C-G formula based on SCr of 0.97 mg/dL). Liver Function Tests: Recent Labs  Lab 08/16/22 2202 08/17/22 0224 08/18/22 0829 08/19/22 0616 08/20/22 0542  AST 65* 44* 26 29 37  ALT 48* 41 34 41 54*  ALKPHOS 56 51 47 47 46  BILITOT 1.5* 1.3* 0.8 1.0 0.9  PROT 7.0 6.3* 5.9* 5.7* 5.6*  ALBUMIN 3.3* 2.9* 2.6* 2.6* 2.5*    No results for input(s): "LIPASE", "AMYLASE" in the  last 168 hours. No results for input(s): "AMMONIA" in the last 168 hours. Coagulation Profile: Recent Labs  Lab 08/17/22 1931  INR 1.3*    Cardiac Enzymes: Recent Labs  Lab 08/14/22 0617 08/16/22 2202  CKTOTAL 1,836* 450*    BNP (last 3 results) No results for input(s): "PROBNP" in the last 8760 hours. HbA1C: No results for input(s): "HGBA1C" in the last 72 hours. CBG: No results for input(s): "GLUCAP" in the last 168 hours. Lipid Profile: No results for input(s): "CHOL", "HDL", "LDLCALC", "TRIG", "CHOLHDL", "LDLDIRECT" in the last 72 hours. Thyroid Function Tests: No results for input(s): "TSH", "T4TOTAL", "FREET4", "T3FREE", "THYROIDAB" in the last 72 hours. Anemia Panel: Recent Labs    08/19/22 0616 08/20/22 0542  FERRITIN 626* 641*    Urine analysis:    Component Value Date/Time   COLORURINE YELLOW (A) 08/17/2022 0105   APPEARANCEUR CLEAR (A) 08/17/2022 0105   LABSPEC 1.010 08/17/2022 0105   PHURINE 7.0 08/17/2022 0105   GLUCOSEU NEGATIVE 08/17/2022 0105   HGBUR SMALL (A) 08/17/2022 0105   BILIRUBINUR NEGATIVE 08/17/2022 0105   KETONESUR NEGATIVE 08/17/2022 0105   PROTEINUR NEGATIVE 08/17/2022 0105   UROBILINOGEN 0.2 07/14/2009 1503   NITRITE NEGATIVE 08/17/2022 0105   LEUKOCYTESUR NEGATIVE 08/17/2022 0105   Sepsis Labs: '@LABRCNTIP'$ (procalcitonin:4,lacticidven:4)  Recent Results (from the past 240 hour(s))  Resp Panel by RT-PCR (Flu A&B, Covid) Anterior Nasal Swab     Status: Abnormal   Collection Time: 08/12/22  6:23 AM   Specimen: Anterior Nasal Swab  Result Value Ref Range Status   SARS Coronavirus 2 by RT PCR POSITIVE (A) NEGATIVE Final    Comment: (NOTE) SARS-CoV-2 target nucleic acids are DETECTED.  The SARS-CoV-2 RNA is generally detectable in upper respiratory specimens during the acute phase of infection. Positive results are indicative of the presence of the identified virus, but do not rule out bacterial infection or co-infection with  other pathogens not detected by the test. Clinical correlation with patient history and other diagnostic information is necessary to determine patient infection status. The expected result is Negative.  Fact Sheet for Patients: EntrepreneurPulse.com.au  Fact Sheet for Healthcare Providers: IncredibleEmployment.be  This test is not yet approved or cleared by the [place] FDA  and  has been authorized for detection and/or diagnosis of SARS-CoV-2 by FDA under an Emergency Use Authorization (EUA).  This EUA will remain in effect (meaning this test can be used) for the duration of  the COVID-19 declaration under Section 564(b)(1) of the A ct, 21 U.S.C. section 360bbb-3(b)(1), unless the authorization is terminated or revoked sooner.     Influenza A by PCR NEGATIVE NEGATIVE Final   Influenza B by PCR NEGATIVE NEGATIVE Final    Comment: (NOTE) The Xpert Xpress SARS-CoV-2/FLU/RSV plus assay is intended as an aid in the diagnosis of influenza from Nasopharyngeal swab specimens and should not be used as a sole basis for treatment. Nasal washings and aspirates are unacceptable for Xpert Xpress SARS-CoV-2/FLU/RSV testing.  Fact Sheet for Patients: EntrepreneurPulse.com.au  Fact Sheet for Healthcare Providers: IncredibleEmployment.be  This test is not yet approved or cleared by the [place] FDA and has been authorized for detection and/or diagnosis of SARS-CoV-2 by FDA under an Emergency Use Authorization (EUA). This EUA will remain in effect (meaning this test can be used) for the duration of the COVID-19 declaration under Section 564(b)(1) of the Act, 21 U.S.C. section 360bbb-3(b)(1), unless the authorization is terminated or revoked.  Performed at [facility], [address] [place], [place] [address]   Blood culture (routine x 2)     Status: Abnormal   Collection Time: 08/16/22 10:02 PM    Specimen: BLOOD  Result Value Ref Range Status   Specimen Description   Final    BLOOD LEFT HAND Performed at [facility], [address] [place], [place] [phone]    Special Requests   Final    BOTTLES DRAWN AEROBIC AND ANAEROBIC Blood Culture results may not be optimal due to an inadequate volume of blood received in culture bottles Performed at [facility], [address] [place], [place] [address]    Culture  Setup Time   Final    GRAM POSITIVE COCCI ANAEROBIC BOTTLE ONLY GRAM STAIN REVIEWED-AGREE WITH RESULT CRITICAL RESULT CALLED TO, READ BACK BY AND VERIFIED WITH: NATHAN BELUE AT 2323 08/17/2022 GA Performed at [facility], [address] [place], [place] [address]    Culture (A)  Final    STAPHYLOCOCCUS AUREUS SUSCEPTIBILITIES PERFORMED ON PREVIOUS CULTURE WITHIN THE LAST 5 DAYS. Performed at [facility], [place] [address], [place], [place] [address]    Report Status 08/20/2022 FINAL  Final  Blood culture (routine x 2)     Status: Abnormal   Collection Time: 08/17/22  2:54 AM   Specimen: BLOOD RIGHT ARM  Result Value Ref Range Status   Specimen Description   Final    BLOOD RIGHT ARM Performed at [facility], [address], [place], [place] [address]    Special Requests   Final    BOTTLES DRAWN AEROBIC AND ANAEROBIC Blood Culture results may not be optimal due to an inadequate volume of blood received in culture bottles Performed at [facility], [address] [place], [place] [address]    Culture  Setup Time   Final    GRAM POSITIVE COCCI AEROBIC BOTTLE ONLY CRITICAL RESULT CALLED TO, READ BACK BY AND VERIFIED WITH: PHARMD BRANDON BEERS AT [place] 08/17/2022 GAA Performed at [facility], [place] [address] [place], [place] [address]    Culture STAPHYLOCOCCUS AUREUS (A)  Final   Report Status 08/20/2022 FINAL  Final   Organism ID, Bacteria STAPHYLOCOCCUS AUREUS  Final  Susceptibility    Staphylococcus aureus - MIC*    CIPROFLOXACIN <=0.5 SENSITIVE Sensitive     ERYTHROMYCIN <=0.25 SENSITIVE Sensitive     GENTAMICIN <=0.5 SENSITIVE Sensitive     OXACILLIN 0.5 SENSITIVE Sensitive     TETRACYCLINE <=1 SENSITIVE Sensitive     VANCOMYCIN <=0.5 SENSITIVE Sensitive     TRIMETH/SULFA <=10 SENSITIVE Sensitive     CLINDAMYCIN <=0.25 SENSITIVE Sensitive     RIFAMPIN <=0.5 SENSITIVE Sensitive     Inducible Clindamycin NEGATIVE Sensitive     * STAPHYLOCOCCUS AUREUS  Blood Culture ID Panel (Reflexed)     Status: Abnormal   Collection Time: 08/17/22  2:54 AM  Result Value Ref Range Status   Enterococcus faecalis NOT DETECTED NOT DETECTED Final   Enterococcus Faecium NOT DETECTED NOT DETECTED Final   Listeria monocytogenes NOT DETECTED NOT DETECTED Final   Staphylococcus species DETECTED (A) NOT DETECTED Final    Comment: CRITICAL RESULT CALLED TO, READ BACK BY AND VERIFIED WITH: PHARMD BRANDON BEERS AT 1930 08/17/2022 GAA    Staphylococcus aureus (BCID) DETECTED (A) NOT DETECTED Final    Comment: CRITICAL RESULT CALLED TO, READ BACK BY AND VERIFIED WITH: PHARMD BRANDON BEERS AT 1930 08/17/2022 GAA    Staphylococcus epidermidis NOT DETECTED NOT DETECTED Final   Staphylococcus lugdunensis NOT DETECTED NOT DETECTED Final   Streptococcus species NOT DETECTED NOT DETECTED Final   Streptococcus agalactiae NOT DETECTED NOT DETECTED Final   Streptococcus pneumoniae NOT DETECTED NOT DETECTED Final   Streptococcus pyogenes NOT DETECTED NOT DETECTED Final   A.calcoaceticus-baumannii NOT DETECTED NOT DETECTED Final   Bacteroides fragilis NOT DETECTED NOT DETECTED Final   Enterobacterales NOT DETECTED NOT DETECTED Final   Enterobacter cloacae complex NOT DETECTED NOT DETECTED Final   Escherichia coli NOT DETECTED NOT DETECTED Final   Klebsiella aerogenes NOT DETECTED NOT DETECTED Final   Klebsiella oxytoca NOT DETECTED NOT DETECTED Final   Klebsiella pneumoniae NOT DETECTED NOT DETECTED  Final   Proteus species NOT DETECTED NOT DETECTED Final   Salmonella species NOT DETECTED NOT DETECTED Final   Serratia marcescens NOT DETECTED NOT DETECTED Final   Haemophilus influenzae NOT DETECTED NOT DETECTED Final   Neisseria meningitidis NOT DETECTED NOT DETECTED Final   Pseudomonas aeruginosa NOT DETECTED NOT DETECTED Final   Stenotrophomonas maltophilia NOT DETECTED NOT DETECTED Final   Candida albicans NOT DETECTED NOT DETECTED Final   Candida auris NOT DETECTED NOT DETECTED Final   Candida glabrata NOT DETECTED NOT DETECTED Final   Candida krusei NOT DETECTED NOT DETECTED Final   Candida parapsilosis NOT DETECTED NOT DETECTED Final   Candida tropicalis NOT DETECTED NOT DETECTED Final   Cryptococcus neoformans/gattii NOT DETECTED NOT DETECTED Final   Meth resistant mecA/C and MREJ NOT DETECTED NOT DETECTED Final    Comment: Performed at [facility], [address] [place], [place] [address]         Radiology Studies: CT Angio Chest Pulmonary Embolism (PE) W or WO Contrast  Result Date: 08/17/2022 CLINICAL DATA:  Right-sided chest pain. Bruising along the left side of the chest. Hypotension. EXAM: CT ANGIOGRAPHY CHEST WITH CONTRAST TECHNIQUE: Multidetector CT imaging of the chest was performed using the standard protocol during bolus administration of intravenous contrast. Multiplanar CT image reconstructions and MIPs were obtained to evaluate the vascular anatomy. RADIATION DOSE REDUCTION: This exam was performed according to the departmental dose-optimization program which includes automated exposure control, adjustment of the mA and/or kV according to patient size and/or use of iterative reconstruction  technique. CONTRAST:  22m OMNIPAQUE IOHEXOL 350 MG/ML SOLN COMPARISON:  Chest radiograph 08/16/2022 and chest CT 11/01/2016 FINDINGS: Despite efforts by the technologist and patient, motion artifact is present on today's exam and could not be eliminated. This reduces  exam sensitivity and specificity. Cardiovascular: No filling defect is identified in the pulmonary arterial tree to suggest pulmonary embolus. Atherosclerotic calcification of the aortic arch. Mild cardiomegaly. Suspected left anterior descending coronary artery atherosclerotic calcification. Poor opacification of the systemic vasculature including the aorta. Mediastinum/Nodes: Unremarkable Lungs/Pleura: Nonspecific ground-glass opacities favoring the lung bases potentially reflecting edema or air trapping. More confluent airspace opacity in the right lower lobe, pneumonia not excluded. There is airway thickening in the right lower lobe and peripheral atelectasis in the posterior basal segment left lower lobe. Upper Abdomen: Fluid density Bosniak category 1 cyst of the right mid kidney medially, requiring no follow up. Hypodense lesion of the right kidney upper pole anteriorly on image 143 series 4 has an internal density of 16 Hounsfield units, favoring a benign Bosniak category 2 cyst. No further imaging workup of this lesion is indicated. Musculoskeletal: Bilateral total shoulder arthroplasties. Thoracic spondylosis. Review of the MIP images confirms the above findings. IMPRESSION: 1. No filling defect is identified in the pulmonary arterial tree to suggest pulmonary embolus. 2. Mild cardiomegaly with suspected left anterior descending coronary artery atherosclerotic calcification. 3. Nonspecific ground-glass opacities favoring the lung bases probably reflect mild pulmonary edema. 4. More confluent airspace opacity in the right lower lobe, pneumonia not excluded. 5. Airway thickening in the right lower lobe, query bronchitis or reactive airways disease. 6. Aortic atherosclerosis. Aortic Atherosclerosis (ICD10-I70.0). Electronically Signed   By: WVan ClinesM.D.   On: 08/17/2022 15:34   DG Chest Portable 1 View  Result Date: 08/16/2022 CLINICAL DATA:  Shortness of breath EXAM: PORTABLE CHEST 1 VIEW  COMPARISON:  08/12/2022 FINDINGS: Bilateral shoulder replacements. Cardiomegaly with central congestion. Patchy atelectasis left base. No pleural effusion or pneumothorax. IMPRESSION: Cardiomegaly with mild central congestion, overall appearance improved since 08/12/2022. Patchy atelectasis left base. Electronically Signed   By: KDonavan FoilM.D.   On: 08/16/2022 22:26            LOS: 2 days      NEmeterio Reeve DO [facility] 08/20/2022, 1:15 PM   Staff may message me via secure chat in [place] but this may not receive immediate response,  please page for urgent matters!  If 7PM-7AM, please contact night-coverage [email or web address]  Dictation software was used to generate the above note. Typos may occur and escape review, as with typed/written notes. Please contact Dr ASheppard Coildirectly for clarity if needed.

## 2022-08-20 NOTE — Consult Note (Signed)
ANTICOAGULATION CONSULT NOTE - Follow Up Consult  Pharmacy Consult for heparin gtt Indication: atrial fibrillation  Allergies  Allergen Reactions   Peanut-Containing Drug Products Anaphylaxis and Dermatitis   Yellow Jacket Venom [Bee Venom] Anaphylaxis and Other (See Comments)    Respiratory Distress   Celebrex [Celecoxib] Other (See Comments)    BLACK STOOL?MELENA?   Percocet [Oxycodone-Acetaminophen] Hives, Swelling and Other (See Comments)   Latex Rash   Nsaids Rash    Patient Measurements: Height: '5\' 8"'$  (172.7 cm) Weight: 106.6 kg (235 lb) IBW/kg (Calculated) : 68.4 Heparin Dosing Weight: 91.8 kg  Vital Signs: Temp: 98.4 F (36.9 C) (11/06 0057) Temp Source: Oral (11/06 0057) BP: 121/79 (11/06 0600) Pulse Rate: 68 (11/06 0057)  Labs: Recent Labs    08/17/22 1931 08/18/22 0829 08/18/22 0829 08/18/22 1643 08/19/22 0616 08/20/22 0542  HGB  --  13.2   < >  --  13.9 13.6  HCT  --  38.9*  --   --  41.1 39.4  PLT  --  183  --   --  209 214  APTT 30  --   --   --   --   --   LABPROT 15.6*  --   --   --   --   --   INR 1.3*  --   --   --   --   --   HEPARINUNFRC  --  0.52   < > 0.55 0.47 0.43  CREATININE  --  0.90  --   --  0.90  --    < > = values in this interval not displayed.     Estimated Creatinine Clearance: 89.1 mL/min (by C-G formula based on SCr of 0.9 mg/dL).   Medications:  +heparin gtt  Enoxaparin ppx prior to heparin consult No PTA anticoagulation  Assessment: 71 y.o. male with a hx of stroke, MI with nonobstructive coronary artery disease, questionable CHF, gastric ulcers, and obstructive sleep apnea who was evaluated for atrial fibrillation with rapid ventricular response and elevated troponin.  Cards recommends heparin gtt with transition to apixaban prior to discharge.  Baseline labs: aPTT 30, INR 1.3, Hgb 13.4, Plts 168  11/4 0829  HL=0.52   therapeutic x1 11/4 1643  HL=0.55   Therapeutic x2; 1350 un/hr 11/5 0616  HL=0.47     therapeutic 11/6 0542  HL=0.43    therapeutic    Goal of Therapy:  Heparin level 0.3-0.7 units/ml Monitor platelets by anticoagulation protocol: Yes   Plan:  Continue heparin infusion at 1350 units/hr Check anti-Xa level daily while on heparin; next level with AM labs Continue to monitor H&H and platelets daily  Renda Rolls, PharmD, [facility] 08/20/2022 6:15 AM

## 2022-08-20 NOTE — Progress Notes (Signed)
Progress Note  Patient Name: Nicholas [name] Date of Encounter: 08/20/2022  Primary Cardiologist: new - consult by End   Subjective   No chest pain, dyspnea, or palpitations. Remains in Afib with controlled ventricular response.   Inpatient Medications    Scheduled Meds:  vitamin C  500 mg Oral Daily   B-complex with vitamin C  1 tablet Oral Daily   cholecalciferol  1,000 Units Oral Daily   divalproex  500 mg Oral Daily   ferrous sulfate  325 mg Oral Q breakfast   fluticasone  2 spray Each Nare Daily   furosemide  20 mg Oral q morning   gabapentin  300 mg Oral Q breakfast   And   gabapentin  600 mg Oral BID   influenza vaccine adjuvanted  0.5 mL Intramuscular Tomorrow-1000   levothyroxine  112 mcg Oral Q0600   loratadine  10 mg Oral Daily   metoprolol succinate  100 mg Oral Daily   pantoprazole  40 mg Oral QODAY   predniSONE  50 mg Oral Daily   tamsulosin  0.4 mg Oral Daily   zinc sulfate  220 mg Oral Daily   Continuous Infusions:   ceFAZolin (ANCEF) IV 2 g (08/20/22 0601)   heparin 1,350 Units/hr (08/19/22 2311)   PRN Meds: chlorpheniramine-HYDROcodone, EPINEPHrine, guaiFENesin-dextromethorphan, magnesium hydroxide, methocarbamol, nitroGLYCERIN, ondansetron **OR** ondansetron (ZOFRAN) IV, promethazine, tiZANidine, traZODone   Vital Signs    Vitals:   08/19/22 1541 08/19/22 2153 08/20/22 0057 08/20/22 0600  BP: 111/69 102/63 126/83 121/79  Pulse: 73 62 68   Resp: '19  18 20  '$ Temp:  98.3 F (36.8 C) 98.4 F (36.9 C)   TempSrc:  Oral Oral   SpO2: 95% 95% 96% 99%  Weight:      Height:        Intake/Output Summary (Last 24 hours) at 08/20/2022 0828 Last data filed at 08/19/2022 1218 Gross per 24 hour  Intake 85.08 ml  Output --  Net 85.08 ml   Filed Weights   08/17/22 0832  Weight: 106.6 kg    Telemetry    Afib in the 80s bpm, artifact - Personally Reviewed  ECG    No new tracings - Personally Reviewed  Physical Exam   GEN: No acute distress.    Neck: No JVD. Cardiac: IRIR, no murmurs, rubs, or gallops.  Respiratory: Diminished breath sounds along the bases bilaterally.  GI: Soft, nontender, non-distended.   MS: No edema; No deformity. Neuro:  Alert and oriented x 3; Nonfocal.  Psych: Normal affect.  Labs    Chemistry Recent Labs  Lab 08/18/22 0829 08/19/22 0616 08/20/22 0542  NA 139 137 137  K 4.1 4.0 3.9  CL 107 103 105  CO2 '26 26 26  '$ GLUCOSE 163* 178* 156*  BUN 27* 27* 37*  CREATININE 0.90 0.90 0.97  CALCIUM 7.7* 7.8* 7.8*  PROT 5.9* 5.7* 5.6*  ALBUMIN 2.6* 2.6* 2.5*  AST 26 29 37  ALT 34 41 54*  ALKPHOS 47 47 46  BILITOT 0.8 1.0 0.9  GFRNONAA >60 >60 >60  ANIONGAP '6 8 6     '$ Hematology Recent Labs  Lab 08/18/22 0829 08/19/22 0616 08/20/22 0542  WBC 8.6 8.1 10.6*  RBC 4.14* 4.40 4.29  HGB 13.2 13.9 13.6  HCT 38.9* 41.1 39.4  MCV 94.0 93.4 91.8  MCH 31.9 31.6 31.7  MCHC 33.9 33.8 34.5  RDW 13.2 12.8 12.8  PLT 183 209 214    Cardiac EnzymesNo results for input(s): "  TROPONINI" in the last 168 hours. No results for input(s): "TROPIPOC" in the last 168 hours.   BNPNo results for input(s): "BNP", "PROBNP" in the last 168 hours.   DDimer No results for input(s): "DDIMER" in the last 168 hours.   Radiology    No results found.  Cardiac Studies   2D echo pending  Patient Profile     71 y.o. male with history of MI with nonobstructive CAD, CVA, questionable CHF, gastric ulcers, and OSA with recent Covid PNA who we are seeing of Afib with RVR and elevated troponin.   Assessment & Plan    PAF: -Remains in Afib with controlled ventricular response -CHADS2VASc 3-4 (age x 1, ?CHF, CVA x 2) -Heparin gtt for now with recommendation for DOAC prior to discharge -Given history of gastric ulcers, PPI -Toprol XL -TSH normal -If ventricular rates remain well controlled, would favor DCCV in the outpatient setting after he has been adequately anticoagulated, without interruption, for at least 4 weeks,  and after he has recovered from his acute illness -If his ventricular rates become difficult to control, would need TEE/DCCV prior to discharge    Elevated high sensitivity troponin: -Minimally elevated and down trending -Felt to represent supply demand ischemia in the setting of recent Covid infection and Afib with RVR -Echo pending, if preserved LVSF no plans for inpatient ischemic evaluation -LHC in 2019 showed no significant CAD -Consider outpatient coronary CTA   MSSA bacteremia/Covid: -Per primary team and ID -Would prefer not to do TEE while he has active Covid given aerosolizing procedure, reactive airway -Awaiting TTE, if unremarkable, will need to discuss if/when TEE needed with ID      For questions or updates, please contact [facility] Please consult [email or web address] for contact info under Cardiology/STEMI.    Signed, Christell Faith, PA-C [place] Pager: [phone] 08/20/2022, 8:28 AM

## 2022-08-20 NOTE — Evaluation (Signed)
Occupational Therapy Evaluation Patient Details Name: Nicholas [name] MRN: [id number] DOB: 1951/09/18 Today's Date: 08/20/2022   History of Present Illness Nicholas [name] is a 20, past medical history CAD/MI, GERD, PFO, remote history of seizure, questionable history of stroke/CHF.  Presented to the 08/16/2022 from home via EMS for fall,  found to be COVID +   Clinical Impression   Nicholas [name] was seen for OT evaluation this date. Prior to hospital admission, pt was MOD I for mobility using 4WW, reports hx of falls. Pt lives alone at [place]. Pt presents to acute OT demonstrating impaired ADL performance and functional mobility 2/2 decreased activity tolerance and functional strength/ROM/balance deficits. Pt currently requires no assist exit bed. MOD I for LB access seated EOB. SBA + RW bed>chair and functional mobility, tolerates ~40 ft. SpO2 86% on RA, resolved to 92% with standing rest break. Pt would benefit from skilled OT to address noted impairments and functional limitations (see below for any additional details). Upon hospital discharge, recommend Nicholas, however as pt lives alone if unable to have assistance then may benefit from STR.        Recommendations for follow up therapy are one component of a multi-disciplinary discharge planning process, led by the attending physician.  Recommendations may be updated based on patient status, additional functional criteria and insurance authorization.   Follow Up Recommendations  Home health OT    Assistance Recommended at Discharge Intermittent Supervision/Assistance  Patient can return home with the following Help with stairs or ramp for entrance    Functional Status Assessment  Patient has had a recent decline in their functional status and demonstrates the ability to make significant improvements in function in a reasonable and predictable amount of time.  Equipment Recommendations  BSC/3in1    Recommendations for Other Services        Precautions / Restrictions Precautions Precautions: Fall Restrictions Weight Bearing Restrictions: No      Mobility Bed Mobility Overal bed mobility: Modified Independent             General bed mobility comments: HOB elevated    Transfers Overall transfer level: Needs assistance Equipment used: Rolling walker (2 wheels) Transfers: Sit to/from Stand Sit to Stand: Supervision                  Balance Overall balance assessment: Needs assistance Sitting-balance support: No upper extremity supported, Feet supported Sitting balance-Leahy Scale: Good     Standing balance support: Single extremity supported, During functional activity Standing balance-Leahy Scale: Good                             ADL either performed or assessed with clinical judgement   ADL Overall ADL's : Needs assistance/impaired                                       General ADL Comments: MOD I for LB access seated EOB. SBA + RW for ADL t/f, tolerates ~40 ft.      Pertinent Vitals/Pain Pain Assessment Pain Assessment: No/denies pain     Hand Dominance     Extremity/Trunk Assessment Upper Extremity Assessment Upper Extremity Assessment: Overall WFL for tasks assessed   Lower Extremity Assessment Lower Extremity Assessment: Generalized weakness RLE Deficits / Details: R knee maintains extension at all times as pt reports he has a "steel rod"  in it & has been unable to flex knee at all x 2 years       Communication Communication Communication: HOH   Cognition Arousal/Alertness: Awake/alert Behavior During Therapy: WFL for tasks assessed/performed Overall Cognitive Status: Within Functional Limits for tasks assessed                                       General Comments   SPO2 86% on RA, resolved to 92% with standing rest break             Home Living Family/patient expects to be discharged to:: Private residence (ILF) Living  Arrangements: Alone Available Help at Discharge: Available PRN/intermittently;Family Type of Home: Independent living facility Home Access: Ramped entrance     Home Layout: One level               Home Equipment: Rollator (4 wheels);Cane - single point;Electric scooter   Additional Comments: [facility] living.      Prior Functioning/Environment Prior Level of Function : Independent/Modified Independent;Driving             Mobility Comments: Pt reports he's ambulatory with rollator but endorses many falls recently, getting weaker as well.          OT Problem List: Decreased activity tolerance;Decreased safety awareness      OT Treatment/Interventions: Self-care/ADL training;Therapeutic exercise;Energy conservation;DME and/or AE instruction;Therapeutic activities;Patient/family education;Balance training    OT Goals(Current goals can be found in the care plan section) Acute Rehab OT Goals Patient Stated Goal: to go home OT Goal Formulation: With patient Time For Goal Achievement: 09/03/22 Potential to Achieve Goals: Good ADL Goals Pt Will Perform Grooming: with modified independence;standing Pt Will Perform Lower Body Dressing: with modified independence;sit to/from stand Pt Will Transfer to Toilet: with modified independence;ambulating;regular height toilet  OT Frequency: Min 2X/week    Co-evaluation              AM-PAC OT "6 Clicks" Daily Activity     Outcome Measure Help from another person eating meals?: None Help from another person taking care of personal grooming?: A Little Help from another person toileting, which includes using toliet, bedpan, or urinal?: A Little Help from another person bathing (including washing, rinsing, drying)?: A Little Help from another person to put on and taking off regular upper body clothing?: None Help from another person to put on and taking off regular lower body clothing?: A  Little 6 Click Score: 20   End of Session Nurse Communication: Mobility status  Activity Tolerance: Patient tolerated treatment well Patient left: in chair;with call bell/phone within reach;with chair alarm set  OT Visit Diagnosis: Other abnormalities of gait and mobility (R26.89)                Time: 5621-3086 OT Time Calculation (min): 23 min Charges:  OT General Charges $OT Visit: 1 Visit OT Evaluation $OT Eval Moderate Complexity: 1 Mod OT Treatments $Therapeutic Activity: 8-22 mins  Dessie Coma, M.S. OTR/L  08/20/22, 2:08 PM  ascom [phone]

## 2022-08-21 ENCOUNTER — Other Ambulatory Visit (HOSPITAL_COMMUNITY): Payer: Self-pay

## 2022-08-21 ENCOUNTER — Telehealth (HOSPITAL_COMMUNITY): Payer: Self-pay | Admitting: Pharmacy Technician

## 2022-08-21 ENCOUNTER — Telehealth: Payer: Self-pay

## 2022-08-21 DIAGNOSIS — E11621 Type 2 diabetes mellitus with foot ulcer: Secondary | ICD-10-CM | POA: Diagnosis not present

## 2022-08-21 DIAGNOSIS — I480 Paroxysmal atrial fibrillation: Secondary | ICD-10-CM | POA: Diagnosis not present

## 2022-08-21 DIAGNOSIS — B9561 Methicillin susceptible Staphylococcus aureus infection as the cause of diseases classified elsewhere: Secondary | ICD-10-CM | POA: Diagnosis not present

## 2022-08-21 DIAGNOSIS — U071 COVID-19: Secondary | ICD-10-CM | POA: Diagnosis not present

## 2022-08-21 DIAGNOSIS — L97509 Non-pressure chronic ulcer of other part of unspecified foot with unspecified severity: Secondary | ICD-10-CM

## 2022-08-21 DIAGNOSIS — R7881 Bacteremia: Secondary | ICD-10-CM | POA: Diagnosis not present

## 2022-08-21 LAB — CBC
HCT: 41.1 % (ref 39.0–52.0)
Hemoglobin: 14 g/dL (ref 13.0–17.0)
MCH: 31.2 pg (ref 26.0–34.0)
MCHC: 34.1 g/dL (ref 30.0–36.0)
MCV: 91.5 fL (ref 80.0–100.0)
Platelets: 207 10*3/uL (ref 150–400)
RBC: 4.49 MIL/uL (ref 4.22–5.81)
RDW: 12.9 % (ref 11.5–15.5)
WBC: 14.7 10*3/uL — ABNORMAL HIGH (ref 4.0–10.5)
nRBC: 0 % (ref 0.0–0.2)

## 2022-08-21 LAB — FERRITIN: Ferritin: 713 ng/mL — ABNORMAL HIGH (ref 24–336)

## 2022-08-21 LAB — COMPREHENSIVE METABOLIC PANEL
ALT: 69 U/L — ABNORMAL HIGH (ref 0–44)
AST: 49 U/L — ABNORMAL HIGH (ref 15–41)
Albumin: 2.5 g/dL — ABNORMAL LOW (ref 3.5–5.0)
Alkaline Phosphatase: 45 U/L (ref 38–126)
Anion gap: 4 — ABNORMAL LOW (ref 5–15)
BUN: 35 mg/dL — ABNORMAL HIGH (ref 8–23)
CO2: 28 mmol/L (ref 22–32)
Calcium: 7.7 mg/dL — ABNORMAL LOW (ref 8.9–10.3)
Chloride: 104 mmol/L (ref 98–111)
Creatinine, Ser: 0.91 mg/dL (ref 0.61–1.24)
GFR, Estimated: >60 mL/min (ref >60)
Glucose, Bld: 132 mg/dL — ABNORMAL HIGH (ref 70–99)
Potassium: 4 mmol/L (ref 3.5–5.1)
Sodium: 136 mmol/L (ref 135–145)
Total Bilirubin: 0.9 mg/dL (ref 0.3–1.2)
Total Protein: 5.4 g/dL — ABNORMAL LOW (ref 6.5–8.1)

## 2022-08-21 LAB — C-REACTIVE PROTEIN: CRP: 0.7 mg/dL (ref <1.0)

## 2022-08-21 MED ORDER — APIXABAN 5 MG PO TABS
5.0000 mg | ORAL_TABLET | Freq: Two times a day (BID) | ORAL | Status: DC
  Administered 2022-08-21 – 2022-08-25 (×8): 5 mg via ORAL
  Filled 2022-08-21 (×8): qty 1

## 2022-08-21 MED ORDER — APIXABAN 5 MG PO TABS: 5.0000 mg | ORAL_TABLET | Freq: Two times a day (BID) | ORAL | Status: DC

## 2022-08-21 NOTE — Telephone Encounter (Signed)
Pharmacy Patient Advocate Encounter  Insurance verification completed.    The patient is insured through [employer] Medicare Part D   The patient is currently admitted and ran test claims for the following: Eliquis.  Copays and coinsurance results were relayed to Inpatient clinical team.  

## 2022-08-21 NOTE — Progress Notes (Signed)
PROGRESS NOTE    Nicholas [name]   TOI:[id number] DOB: 10-10-1951  DOA: 08/16/2022 Date of Service: 08/21/22 PCP: [name], MD     Brief Narrative / Hospital Course:  Nicholas [name] is a 71, past medical history CAD/MI, GERD, PFO, remote history of seizure, questionable history of stroke/CHF.  Presented to the 08/16/2022 from home via EMS for fall, per EMS patient was hypotensive with systolic in 83J, tachycardic in the 140s, saturating 80% on room air. 11/02-11/03: In ED, initial BP 167, SPO2 100%), taper down to 2L [place], EKG A-fib RVR rate 117. Labs borderline potassium of 3.5 and a BUN of 27 with albumin 3.3, AST 65 and ALT 48.  Total CK was 450 and high sensitive troponin I was 56.  CBC showed leukocytosis of 13 with neutrophilia.  Blood cultures were drawn.  UA was negative.  COVID-positive.  CXR cardiomegaly with mild central congestion.  Treated with Zofran, 1 L normal saline.  Admitted to hospitalist service.  Cardiology consult placed by admitting hospitalist. 11/03: cardiology saw patient, recommended eval for PE - CTA Chest negative for PE, (+)mild pulmonary edema, RLL possible pneumonia / airway thickening, CAD and aortic atherosclerosis, cardiomegaly. Remains weak, on O2.  11/04: (+)BCx x2 for MSSA, started on Cefazolin IV, repeat BCx tomorrow, plan for ID f/u Monday (today is Saturday). Finished Paxlovid. Work on weaning off O2. Echo pending.  11/05: pt reports migraine, typically takes depakote for prevention which he has been getting. Hypertensive. Added labetalol IV prn, ok for phenergan for migraine. See A/P.  11/06: VSS, migraine resolved. ID Dr Delaine Lame --> 2d echo, will need TEE but this will be delayed until at least 08/22/22 d/t recent COVID. Continue cefazolin. Await repeat BCx. Sputum culture. Duration and route of antibiotic will be based on repeat culture/TEE  11/07: Dr Juleen China covering ID today. TTE negative.    Consultants:  Cardiology  Infectious Disease    Procedures: none      ASSESSMENT & PLAN:   Principal Problem:   Generalized weakness Active Problems:   Chronic diastolic CHF (congestive heart failure) (HCC)   Hypothyroidism   Dyslipidemia   Seizure disorder (HCC)   Atrial fibrillation with rapid ventricular response ([place])   Fall   COVID-19   COVID-19 w/ acute hypoxic respiratory failure and pneumonia Supplemental O2 Completed Paxlovid Steroids   Atrial Fibrillation w/ RVR CHADS2VASc 3-4 (age x 1, ?CHF, CVA x 2)  Cardiology following Rate control w/ metoprolol Heparin gtt per cardiology to transition to [place] Echo done, pending read  If ventricular rates remain well controlled, would favor DCCV in the outpatient setting after he has been adequately anticoagulated, without interruption, for at least 4 weeks, and after he has recovered from his acute illness   Hypotension Resolve s/p IV fluids   Elevated high sensitivity troponin: Likely demand ischemia in the setting of recent Covid infection and Afib with RVR LHC in 2019 showed no significant CAD Cardiology following  Echo pending, if preserved LVSF no plans for inpatient ischemic evaluation Consider outpatient coronary CTA  Bacteremia likely MSSA (+)BCx x2 started on Cefazolin IV 08/18/22  repeat BCx 11/05 ID following Echo already done, pending read  Will need TEE   Migraine 11/05 - resolved Continue depakote ppx NSAID allergy - can't have this HTN - can't safely given triptan until BP improved Ordered phenergan Consider Fioricet   Generalized weakness and fall PT/OT   Hypothyroidism Synthroid.   Seizure disorder (HCC) Hx migraine  Depakote DR.  Dyslipidemia hold off statin therapy given elevated CK level which could be related to mild rhabdomyolysis vs COVID inflammation   DVT prophylaxis: Heparin --> DOAC  Pertinent IV fluids/nutrition: stopped IV NS today  Central lines / invasive devices: none  Code Status: FULL CODE Family  Communication: pt declined call to support persons today   Disposition: inpatient TOC needs: will need SNF  Barriers to discharge / significant pending items: bacteremia              Subjective:  Patient reports feeling okay today, no SOB, still some coughing. NO fever/chills. Worried about being in the hospital so long - he has a few things he need to take care of at home financially - I've asked TOC if any services to offer / if he needs a hospital note am happy to provide.        Objective:  Vitals:   08/20/22 2313 08/21/22 0620 08/21/22 0838 08/21/22 1309  BP: 133/70 118/75 114/85 123/76  Pulse: 75 76 68 77  Resp: '17 17 18   '$ Temp: 98.3 F (36.8 C) 97.7 F (36.5 C) 97.7 F (36.5 C)   TempSrc: Oral Oral    SpO2: 97% 98% 95% 96%  Weight:  113.2 kg    Height:        Intake/Output Summary (Last 24 hours) at 08/21/2022 1442 Last data filed at 08/21/2022 7619 Gross per 24 hour  Intake 2929.32 ml  Output 550 ml  Net 2379.32 ml   Filed Weights   08/17/22 0832 08/21/22 0620  Weight: 106.6 kg 113.2 kg    Examination: Constitutional:  VS as above General Appearance: alert, well-developed, well-nourished, NAD, eating breakfast and watching football game.  Respiratory: Normal respiratory effort No wheeze No rales Cardiovascular: S1/S2 normal Irreg/irreg Rate WNL Gastrointestinal: No tenderness Musculoskeletal:  No clubbing/cyanosis of digits Symmetrical movement in all extremities Neurological: No cranial nerve deficit on limited exam Alert Psychiatric: Normal judgment/insight Normal mood and affect       Scheduled Medications:   apixaban  5 mg Oral BID   vitamin C  500 mg Oral Daily   B-complex with vitamin C  1 tablet Oral Daily   Chlorhexidine Gluconate Cloth  6 each Topical Daily   cholecalciferol  1,000 Units Oral Daily   divalproex  500 mg Oral Daily   ferrous sulfate  325 mg Oral Q breakfast   fluticasone  2 spray Each Nare Daily    furosemide  20 mg Oral q morning   gabapentin  300 mg Oral Q breakfast   And   gabapentin  600 mg Oral BID   influenza vaccine adjuvanted  0.5 mL Intramuscular Tomorrow-1000   levothyroxine  112 mcg Oral Q0600   loratadine  10 mg Oral Daily   metoprolol succinate  100 mg Oral Daily   mupirocin ointment  1 Application Nasal BID   pantoprazole  40 mg Oral QODAY   tamsulosin  0.4 mg Oral Daily   zinc sulfate  220 mg Oral Daily    Continuous Infusions:   ceFAZolin (ANCEF) IV 2 g (08/21/22 1428)    PRN Medications:  chlorpheniramine-HYDROcodone, EPINEPHrine, guaiFENesin-dextromethorphan, magnesium hydroxide, methocarbamol, nitroGLYCERIN, ondansetron **OR** ondansetron (ZOFRAN) IV, promethazine, tiZANidine, traZODone  Antimicrobials:  Anti-infectives (From admission, onward)    Start     Dose/Rate Route Frequency Ordered Stop   08/17/22 2030  ceFAZolin (ANCEF) IVPB 2g/100 mL premix        2 g 200 mL/hr over 30 Minutes Intravenous Every 8  hours 08/17/22 1944     08/17/22 0130  nirmatrelvir/ritonavir EUA (renal dosing) (PAXLOVID) 2 tablet  Status:  Discontinued       Note to Pharmacy: Take nirmatrelvir (150 mg) one tablet twice daily for 5 days and ritonavir (100 mg) one tablet twice daily for 5 days.     2 tablet Oral 2 times daily 08/16/22 2348 08/18/22 1210       Data Reviewed: I have personally reviewed following labs and imaging studies  CBC: Recent Labs  Lab 08/16/22 2202 08/17/22 0623 08/18/22 0829 08/19/22 0616 08/20/22 0542 08/21/22 0804  WBC 13.0* 10.1 8.6 8.1 10.6* 14.7*  NEUTROABS 10.9* 8.8* 7.3 6.9 8.2*  --   HGB 14.7 13.4 13.2 13.9 13.6 14.0  HCT 44.4 39.5 38.9* 41.1 39.4 41.1  MCV 95.9 94.0 94.0 93.4 91.8 91.5  PLT 168 168 183 209 214 409   Basic Metabolic Panel: Recent Labs  Lab 08/17/22 0224 08/18/22 0829 08/19/22 0616 08/20/22 0542 08/21/22 0804  NA 139 139 137 137 136  K 3.9 4.1 4.0 3.9 4.0  CL 105 107 103 105 104  CO2 '25 26 26 26 28   '$ GLUCOSE 123* 163* 178* 156* 132*  BUN 25* 27* 27* 37* 35*  CREATININE 0.92 0.90 0.90 0.97 0.91  CALCIUM 7.8* 7.7* 7.8* 7.8* 7.7*  MG  --   --  2.2  --   --    GFR: Estimated Creatinine Clearance: 90.9 mL/min (by C-G formula based on SCr of 0.91 mg/dL). Liver Function Tests: Recent Labs  Lab 08/17/22 0224 08/18/22 0829 08/19/22 0616 08/20/22 0542 08/21/22 0804  AST 44* 26 29 37 49*  ALT 41 34 41 54* 69*  ALKPHOS 51 47 47 46 45  BILITOT 1.3* 0.8 1.0 0.9 0.9  PROT 6.3* 5.9* 5.7* 5.6* 5.4*  ALBUMIN 2.9* 2.6* 2.6* 2.5* 2.5*   No results for input(s): "LIPASE", "AMYLASE" in the last 168 hours. No results for input(s): "AMMONIA" in the last 168 hours. Coagulation Profile: Recent Labs  Lab 08/17/22 1931  INR 1.3*   Cardiac Enzymes: Recent Labs  Lab 08/16/22 2202  CKTOTAL 450*   BNP (last 3 results) No results for input(s): "PROBNP" in the last 8760 hours. HbA1C: No results for input(s): "HGBA1C" in the last 72 hours. CBG: No results for input(s): "GLUCAP" in the last 168 hours. Lipid Profile: No results for input(s): "CHOL", "HDL", "LDLCALC", "TRIG", "CHOLHDL", "LDLDIRECT" in the last 72 hours. Thyroid Function Tests: No results for input(s): "TSH", "T4TOTAL", "FREET4", "T3FREE", "THYROIDAB" in the last 72 hours. Anemia Panel: Recent Labs    08/20/22 0542 08/21/22 0804  FERRITIN 641* 713*   Urine analysis:    Component Value Date/Time   COLORURINE YELLOW (A) 08/17/2022 0105   APPEARANCEUR CLEAR (A) 08/17/2022 0105   LABSPEC 1.010 08/17/2022 0105   PHURINE 7.0 08/17/2022 0105   GLUCOSEU NEGATIVE 08/17/2022 0105   HGBUR SMALL (A) 08/17/2022 0105   BILIRUBINUR NEGATIVE 08/17/2022 0105   KETONESUR NEGATIVE 08/17/2022 0105   PROTEINUR NEGATIVE 08/17/2022 0105   UROBILINOGEN 0.2 07/14/2009 1503   NITRITE NEGATIVE 08/17/2022 0105   LEUKOCYTESUR NEGATIVE 08/17/2022 0105   Sepsis Labs: '@LABRCNTIP'$ (procalcitonin:4,lacticidven:4)  Recent Results (from the past  240 hour(s))  Resp Panel by RT-PCR (Flu A&B, Covid) Anterior Nasal Swab     Status: Abnormal   Collection Time: 08/12/22  6:23 AM   Specimen: Anterior Nasal Swab  Result Value Ref Range Status   SARS Coronavirus 2 by RT PCR POSITIVE (A) NEGATIVE Final  Comment: (NOTE) SARS-CoV-2 target nucleic acids are DETECTED.  The SARS-CoV-2 RNA is generally detectable in upper respiratory specimens during the acute phase of infection. Positive results are indicative of the presence of the identified virus, but do not rule out bacterial infection or co-infection with other pathogens not detected by the test. Clinical correlation with patient history and other diagnostic information is necessary to determine patient infection status. The expected result is Negative.  Fact Sheet for Patients: EntrepreneurPulse.com.au  Fact Sheet for Healthcare Providers: IncredibleEmployment.be  This test is not yet approved or cleared by the [place] FDA and  has been authorized for detection and/or diagnosis of SARS-CoV-2 by FDA under an Emergency Use Authorization (EUA).  This EUA will remain in effect (meaning this test can be used) for the duration of  the COVID-19 declaration under Section 564(b)(1) of the A ct, 21 U.S.C. section 360bbb-3(b)(1), unless the authorization is terminated or revoked sooner.     Influenza A by PCR NEGATIVE NEGATIVE Final   Influenza B by PCR NEGATIVE NEGATIVE Final    Comment: (NOTE) The Xpert Xpress SARS-CoV-2/FLU/RSV plus assay is intended as an aid in the diagnosis of influenza from Nasopharyngeal swab specimens and should not be used as a sole basis for treatment. Nasal washings and aspirates are unacceptable for Xpert Xpress SARS-CoV-2/FLU/RSV testing.  Fact Sheet for Patients: EntrepreneurPulse.com.au  Fact Sheet for Healthcare Providers: IncredibleEmployment.be  This test is not yet  approved or cleared by the [place] FDA and has been authorized for detection and/or diagnosis of SARS-CoV-2 by FDA under an Emergency Use Authorization (EUA). This EUA will remain in effect (meaning this test can be used) for the duration of the COVID-19 declaration under Section 564(b)(1) of the Act, 21 U.S.C. section 360bbb-3(b)(1), unless the authorization is terminated or revoked.  Performed at [facility], [address] [place], [place] [address]   Blood culture (routine x 2)     Status: Abnormal   Collection Time: 08/16/22 10:02 PM   Specimen: BLOOD  Result Value Ref Range Status   Specimen Description   Final    BLOOD LEFT HAND Performed at [facility], [address] [place], [place] [address]    Special Requests   Final    BOTTLES DRAWN AEROBIC AND ANAEROBIC Blood Culture results may not be optimal due to an inadequate volume of blood received in culture bottles Performed at [facility], [address] [place], [place] [address]    Culture  Setup Time   Final    GRAM POSITIVE COCCI ANAEROBIC BOTTLE ONLY GRAM STAIN REVIEWED-AGREE WITH RESULT CRITICAL RESULT CALLED TO, READ BACK BY AND VERIFIED WITH: NATHAN BELUE AT 2323 08/17/2022 GA Performed at [facility], [address] [place], [place] [address]    Culture (A)  Final    STAPHYLOCOCCUS AUREUS SUSCEPTIBILITIES PERFORMED ON PREVIOUS CULTURE WITHIN THE LAST 5 DAYS. Performed at [facility], [place] [address] [place], [place] [address]    Report Status 08/20/2022 FINAL  Final  Blood culture (routine x 2)     Status: Abnormal   Collection Time: 08/17/22  2:54 AM   Specimen: BLOOD RIGHT ARM  Result Value Ref Range Status   Specimen Description   Final    BLOOD RIGHT ARM Performed at [facility], [address], [place], [place] [address]    Special Requests   Final    BOTTLES DRAWN AEROBIC AND ANAEROBIC Blood Culture results may not be optimal due  to an inadequate volume of blood received in culture bottles Performed at [facility], [address] [place], [place] [address]    Culture  Setup Time   Final    GRAM POSITIVE COCCI AEROBIC BOTTLE ONLY CRITICAL RESULT CALLED TO, READ BACK BY AND VERIFIED WITH: PHARMD BRANDON BEERS AT [place] 08/17/2022 GAA Performed at [facility], [place] [address] [place], [place] [address]    Culture STAPHYLOCOCCUS AUREUS (A)  Final   Report Status 08/20/2022 FINAL  Final   Organism ID, Bacteria STAPHYLOCOCCUS AUREUS  Final      Susceptibility   Staphylococcus aureus - MIC*    CIPROFLOXACIN <=0.5 SENSITIVE Sensitive     ERYTHROMYCIN <=0.25 SENSITIVE Sensitive     GENTAMICIN <=0.5 SENSITIVE Sensitive     OXACILLIN 0.5 SENSITIVE Sensitive     TETRACYCLINE <=1 SENSITIVE Sensitive     VANCOMYCIN <=0.5 SENSITIVE Sensitive     TRIMETH/SULFA <=10 SENSITIVE Sensitive     CLINDAMYCIN <=0.25 SENSITIVE Sensitive     RIFAMPIN <=0.5 SENSITIVE Sensitive     Inducible Clindamycin NEGATIVE Sensitive     * STAPHYLOCOCCUS AUREUS  Blood Culture ID Panel (Reflexed)     Status: Abnormal   Collection Time: 08/17/22  2:54 AM  Result Value Ref Range Status   Enterococcus faecalis NOT DETECTED NOT DETECTED Final   Enterococcus Faecium NOT DETECTED NOT DETECTED Final   Listeria monocytogenes NOT DETECTED NOT DETECTED Final   Staphylococcus species DETECTED (A) NOT DETECTED Final    Comment: CRITICAL RESULT CALLED TO, READ BACK BY AND VERIFIED WITH: PHARMD BRANDON BEERS AT 1930 08/17/2022 GAA    Staphylococcus aureus (BCID) DETECTED (A) NOT DETECTED Final    Comment: CRITICAL RESULT CALLED TO, READ BACK BY AND VERIFIED WITH: PHARMD BRANDON BEERS AT 1930 08/17/2022 GAA    Staphylococcus epidermidis NOT DETECTED NOT DETECTED Final   Staphylococcus lugdunensis NOT DETECTED NOT DETECTED Final   Streptococcus species NOT DETECTED NOT DETECTED Final   Streptococcus agalactiae NOT DETECTED NOT DETECTED Final    Streptococcus pneumoniae NOT DETECTED NOT DETECTED Final   Streptococcus pyogenes NOT DETECTED NOT DETECTED Final   A.calcoaceticus-baumannii NOT DETECTED NOT DETECTED Final   Bacteroides fragilis NOT DETECTED NOT DETECTED Final   Enterobacterales NOT DETECTED NOT DETECTED Final   Enterobacter cloacae complex NOT DETECTED NOT DETECTED Final   Escherichia coli NOT DETECTED NOT DETECTED Final   Klebsiella aerogenes NOT DETECTED NOT DETECTED Final   Klebsiella oxytoca NOT DETECTED NOT DETECTED Final   Klebsiella pneumoniae NOT DETECTED NOT DETECTED Final   Proteus species NOT DETECTED NOT DETECTED Final   Salmonella species NOT DETECTED NOT DETECTED Final   Serratia marcescens NOT DETECTED NOT DETECTED Final   Haemophilus influenzae NOT DETECTED NOT DETECTED Final   Neisseria meningitidis NOT DETECTED NOT DETECTED Final   Pseudomonas aeruginosa NOT DETECTED NOT DETECTED Final   Stenotrophomonas maltophilia NOT DETECTED NOT DETECTED Final   Candida albicans NOT DETECTED NOT DETECTED Final   Candida auris NOT DETECTED NOT DETECTED Final   Candida glabrata NOT DETECTED NOT DETECTED Final   Candida krusei NOT DETECTED NOT DETECTED Final   Candida parapsilosis NOT DETECTED NOT DETECTED Final   Candida tropicalis NOT DETECTED NOT DETECTED Final   Cryptococcus neoformans/gattii NOT DETECTED NOT DETECTED Final   Meth resistant mecA/C and MREJ NOT DETECTED NOT DETECTED Final    Comment: Performed at [facility], [address] [place], [place] [address]  Culture, blood (Routine X 2) w Reflex to  ID Panel     Status: None (Preliminary result)   Collection Time: 08/19/22  6:16 AM   Specimen: BLOOD  Result Value Ref Range Status   Specimen Description BLOOD BLOOD LEFT ARM  Final   Special Requests   Final    BOTTLES DRAWN AEROBIC AND ANAEROBIC Blood Culture adequate volume   Culture   Final    NO GROWTH 2 DAYS Performed at [facility], [address] [place], [place]  [address]    Report Status PENDING  Incomplete  Culture, blood (Routine X 2) w Reflex to ID Panel     Status: None (Preliminary result)   Collection Time: 08/19/22  6:18 AM   Specimen: BLOOD  Result Value Ref Range Status   Specimen Description BLOOD BLOOD RIGHT HAND  Final   Special Requests   Final    BOTTLES DRAWN AEROBIC AND ANAEROBIC Blood Culture adequate volume   Culture   Final    NO GROWTH 2 DAYS Performed at [facility], [address], [place], [place] [address]    Report Status PENDING  Incomplete  Surgical PCR screen     Status: Abnormal   Collection Time: 08/20/22 10:04 AM   Specimen: Nasal Mucosa; Nasal Swab  Result Value Ref Range Status   MRSA, PCR NEGATIVE NEGATIVE Final   Staphylococcus aureus POSITIVE (A) NEGATIVE Final    Comment: (NOTE) The Xpert SA Assay (FDA approved for NASAL specimens in patients 36 years of age and older), is one component of a comprehensive surveillance program. It is not intended to diagnose infection nor to guide or monitor treatment. Performed at [facility], [address], [place], [place] [address]          Radiology Studies: CT Angio Chest Pulmonary Embolism (PE) W or WO Contrast  Result Date: 08/17/2022 CLINICAL DATA:  Right-sided chest pain. Bruising along the left side of the chest. Hypotension. EXAM: CT ANGIOGRAPHY CHEST WITH CONTRAST TECHNIQUE: Multidetector CT imaging of the chest was performed using the standard protocol during bolus administration of intravenous contrast. Multiplanar CT image reconstructions and MIPs were obtained to evaluate the vascular anatomy. RADIATION DOSE REDUCTION: This exam was performed according to the departmental dose-optimization program which includes automated exposure control, adjustment of the mA and/or kV according to patient size and/or use of iterative reconstruction technique. CONTRAST:  45m OMNIPAQUE IOHEXOL 350 MG/ML SOLN COMPARISON:  Chest radiograph  08/16/2022 and chest CT 11/01/2016 FINDINGS: Despite efforts by the technologist and patient, motion artifact is present on today's exam and could not be eliminated. This reduces exam sensitivity and specificity. Cardiovascular: No filling defect is identified in the pulmonary arterial tree to suggest pulmonary embolus. Atherosclerotic calcification of the aortic arch. Mild cardiomegaly. Suspected left anterior descending coronary artery atherosclerotic calcification. Poor opacification of the systemic vasculature including the aorta. Mediastinum/Nodes: Unremarkable Lungs/Pleura: Nonspecific ground-glass opacities favoring the lung bases potentially reflecting edema or air trapping. More confluent airspace opacity in the right lower lobe, pneumonia not excluded. There is airway thickening in the right lower lobe and peripheral atelectasis in the posterior basal segment left lower lobe. Upper Abdomen: Fluid density Bosniak category 1 cyst of the right mid kidney medially, requiring no follow up. Hypodense lesion of the right kidney upper pole anteriorly on image 143 series 4 has an internal density of 16 Hounsfield units, favoring a benign Bosniak category 2 cyst. No further imaging workup of this lesion is indicated. Musculoskeletal: Bilateral total shoulder arthroplasties. Thoracic spondylosis. Review of the  MIP images confirms the above findings. IMPRESSION: 1. No filling defect is identified in the pulmonary arterial tree to suggest pulmonary embolus. 2. Mild cardiomegaly with suspected left anterior descending coronary artery atherosclerotic calcification. 3. Nonspecific ground-glass opacities favoring the lung bases probably reflect mild pulmonary edema. 4. More confluent airspace opacity in the right lower lobe, pneumonia not excluded. 5. Airway thickening in the right lower lobe, query bronchitis or reactive airways disease. 6. Aortic atherosclerosis. Aortic Atherosclerosis (ICD10-I70.0). Electronically  Signed   By: Van Clines M.D.   On: 08/17/2022 15:34   DG Chest Portable 1 View  Result Date: 08/16/2022 CLINICAL DATA:  Shortness of breath EXAM: PORTABLE CHEST 1 VIEW COMPARISON:  08/12/2022 FINDINGS: Bilateral shoulder replacements. Cardiomegaly with central congestion. Patchy atelectasis left base. No pleural effusion or pneumothorax. IMPRESSION: Cardiomegaly with mild central congestion, overall appearance improved since 08/12/2022. Patchy atelectasis left base. Electronically Signed   By: Donavan Foil M.D.   On: 08/16/2022 22:26            LOS: 3 days      Emeterio Reeve, DO [facility] 08/21/2022, 2:42 PM   Staff may message me via secure chat in [place]  but this may not receive immediate response,  please page for urgent matters!  If 7PM-7AM, please contact night-coverage [email or web address]  Dictation software was used to generate the above note. Typos may occur and escape review, as with typed/written notes. Please contact Dr Sheppard Coil directly for clarity if needed.

## 2022-08-21 NOTE — Progress Notes (Signed)
[facility]  Date of Admission:  08/16/2022           Reason for visit: Follow up on MSSA bacteremia  Current antibiotics: Cefazolin   ASSESSMENT:    71 y.o. male admitted with:  # MSSA bacteremia # Recent COVID infection # Left first metatarsal head ulcer # Prior amputation left great and 2nd toe   - COVID PCR positive 10/29 - blood cx positive 11/2 - repeat cx NGTD from 11/5 - unclear source.  No blood cx drawn during [facility] admission 10/29-11/1 - on room air, chest imaging not convincing for bacterial pneumonia - possible related to prior IV access attempts with multiple bruises on arms and patient report of being a difficult IV stick - TTE negative - Left first met ulcer (followed by Dr [name] outpatient) does not appear infected or a likely source      RECOMMENDATIONS:    Continue cefazolin Follow up repeat blood cx 11/8 will be 10 days from Covid Dx Would prefer to get TEE done while inpatient as this information will be a key component of decision tree and whether to treat for 2-4 vs 6 weeks of antibiotics.  More importantly, it will also guide whether one would feel comfortable discharging home on oral therapy vs placing PICC line and home health orders Following   Principal Problem:   MSSA bacteremia Active Problems:   Hypothyroidism   Chronic diastolic CHF (congestive heart failure) (HCC)   Dyslipidemia   Weakness   Seizure disorder (HCC)   Atrial fibrillation with rapid ventricular response (HCC)   Fall   COVID-19   Bacteremia   Paroxysmal atrial fibrillation (HCC)    MEDICATIONS:    Scheduled Meds:  apixaban  5 mg Oral BID   vitamin C  500 mg Oral Daily   B-complex with vitamin C  1 tablet Oral Daily   Chlorhexidine Gluconate Cloth  6 each Topical Daily   cholecalciferol  1,000 Units Oral Daily   divalproex  500 mg Oral Daily   ferrous sulfate  325 mg Oral Q breakfast   fluticasone  2 spray Each Nare Daily    furosemide  20 mg Oral q morning   gabapentin  300 mg Oral Q breakfast   And   gabapentin  600 mg Oral BID   influenza vaccine adjuvanted  0.5 mL Intramuscular Tomorrow-1000   levothyroxine  112 mcg Oral Q0600   loratadine  10 mg Oral Daily   metoprolol succinate  100 mg Oral Daily   mupirocin ointment  1 Application Nasal BID   pantoprazole  40 mg Oral QODAY   tamsulosin  0.4 mg Oral Daily   zinc sulfate  220 mg Oral Daily   Continuous Infusions:   ceFAZolin (ANCEF) IV 2 g (08/21/22 1428)   PRN Meds:.chlorpheniramine-HYDROcodone, EPINEPHrine, guaiFENesin-dextromethorphan, magnesium hydroxide, methocarbamol, nitroGLYCERIN, ondansetron **OR** ondansetron (ZOFRAN) IV, promethazine, tiZANidine, traZODone  SUBJECTIVE:   24 hour events:  No new complaints  He is on room air States he feels pretty good   Review of Systems  All other systems reviewed and are negative.     OBJECTIVE:   Blood pressure 123/76, pulse 77, temperature 97.7 F (36.5 C), resp. rate 18, height _0  (1.727 m), weight 113.2 kg, SpO2 96 %. Body mass index is 37.95 kg/m.  Physical Exam Constitutional:      Appearance: Normal appearance.  HENT:     Head: Normocephalic and atraumatic.  Mouth/Throat:     Mouth: Mucous membranes are moist.     Pharynx: Oropharynx is clear.  Eyes:     Extraocular Movements: Extraocular movements intact.     Conjunctiva/sclera: Conjunctivae normal.  Pulmonary:     Effort: Pulmonary effort is normal. No respiratory distress.  Abdominal:     General: There is no distension.     Palpations: Abdomen is soft.  Musculoskeletal:     Comments: Left 1st met head ulcer, not infected. Prior toe amputations on the left.   Skin:    General: Skin is warm and dry.  Neurological:     General: No focal deficit present.     Mental Status: He is alert and oriented to person, place, and time.  Psychiatric:        Mood and Affect: Mood normal.        Behavior: Behavior normal.       Lab Results: Lab Results  Component Value Date   WBC 14.7 (H) 08/21/2022   HGB 14.0 08/21/2022   HCT 41.1 08/21/2022   MCV 91.5 08/21/2022   PLT 207 08/21/2022    Lab Results  Component Value Date   NA 136 08/21/2022   K 4.0 08/21/2022   CO2 28 08/21/2022   GLUCOSE 132 (H) 08/21/2022   BUN 35 (H) 08/21/2022   CREATININE 0.91 08/21/2022   CALCIUM 7.7 (L) 08/21/2022   GFRNONAA >60 08/21/2022   GFRAA >60 12/05/2018    Lab Results  Component Value Date   ALT 69 (H) 08/21/2022   AST 49 (H) 08/21/2022   ALKPHOS 45 08/21/2022   BILITOT 0.9 08/21/2022       Component Value Date/Time   CRP 0.7 08/21/2022 0804       Component Value Date/Time   ESRSEDRATE 14 06/15/2022 1635     I have reviewed the micro and lab results in Epic.  Imaging: ECHOCARDIOGRAM COMPLETE  Result Date: 08/20/2022    ECHOCARDIOGRAM REPORT   Patient Name:   Nicholas [name] Date of Exam: 08/20/2022 Medical Rec #:  [id number]      Height:       68.0 in Accession #:    [id number]     Weight:       235.0 lb Date of Birth:  1951-08-21       BSA:          2.189 m Patient Age:    30 years       BP:           121/79 mmHg Patient Gender: M              HR:           72 bpm. Exam Location:  [facility] Procedure: 2D Echo and Intracardiac Opacification Agent Indications:     Atrial Fibrillation I48.91  History:         Patient has prior history of Echocardiogram examinations, most                  recent 01/09/2018. CHF; CAD and Previous Myocardial Infarction.  Sonographer:     Mikki Santee RDCS Referring Phys:  [id number] [place] Diagnosing Phys: Kathlyn Sacramento MD IMPRESSIONS  1. Left ventricular ejection fraction, by estimation, is 55 to 60%. The left ventricle has normal function. Left ventricular endocardial border not optimally defined to evaluate regional wall motion. Left ventricular diastolic parameters are indeterminate.  2. Right ventricular systolic function is normal. The right ventricular size is normal.  3. Left atrial size was mildly dilated.  4. The mitral valve is normal in structure. Moderate mitral valve regurgitation. No evidence of mitral stenosis.  5. The aortic valve is normal in structure. Aortic valve regurgitation is mild. No aortic stenosis is present.  6. Aortic dilatation noted. There is mild dilatation of the ascending aorta, measuring 42 mm. FINDINGS  Left Ventricle: Left ventricular ejection fraction, by estimation, is 55 to 60%. The left ventricle has normal function. Left ventricular endocardial border not optimally defined to evaluate regional wall motion. Definity contrast agent was given IV to delineate the left ventricular endocardial borders. The left ventricular internal cavity size was normal in size. There is no left ventricular hypertrophy. Left ventricular diastolic parameters are indeterminate. Right Ventricle: The right ventricular size is normal. No increase in right ventricular wall thickness. Right ventricular systolic function is normal. Left Atrium: Left atrial size was mildly dilated. Right Atrium: Right atrial size was normal in size. Pericardium: There is no evidence of pericardial effusion. Mitral Valve: The mitral valve is normal in structure. Moderate mitral valve regurgitation. No evidence of mitral valve stenosis. Tricuspid Valve: The tricuspid valve is normal in structure. Tricuspid valve regurgitation is trivial. No evidence of tricuspid stenosis. Aortic Valve: The aortic valve is normal in structure. Aortic valve regurgitation is mild. No aortic stenosis is present. Pulmonic Valve: The pulmonic valve was normal in structure. Pulmonic valve regurgitation is not visualized. No evidence of pulmonic stenosis. Aorta: Aortic dilatation noted. There is mild dilatation of the ascending aorta, measuring 42 mm. Venous: The inferior vena cava was not well visualized. IAS/Shunts: No atrial level shunt detected by color flow Doppler.  LEFT VENTRICLE PLAX 2D LVIDd:         5.20 cm  LVIDs:         3.70 cm LV PW:         1.00 cm LV IVS:        0.90 cm LVOT diam:     2.50 cm LV SV:         67 LV SV Index:   30 LVOT Area:     4.91 cm  RIGHT VENTRICLE RV Basal diam:  3.00 cm RV Mid diam:    3.10 cm RV S prime:     11.20 cm/s TAPSE (M-mode): 1.6 cm LEFT ATRIUM             Index        RIGHT ATRIUM           Index LA diam:        4.20 cm 1.92 cm/m   RA Area:     10.70 cm LA Vol (A2C):   60.1 ml 27.46 ml/m  RA Volume:   20.40 ml  9.32 ml/m LA Vol (A4C):   48.3 ml 22.07 ml/m LA Biplane Vol: 57.5 ml 26.27 ml/m  AORTIC VALVE LVOT Vmax:   70.35 cm/s LVOT Vmean:  44.950 cm/s LVOT VTI:    0.136 m  AORTA Ao Root diam: 3.80 cm MR Peak grad: 102.8 mmHg  TRICUSPID VALVE MR Vmax:      507.00 cm/s TR Peak grad:   18.8 mmHg                           TR Vmax:        217.00 cm/s  SHUNTS                           Systemic VTI:  0.14 m                           Systemic Diam: 2.50 cm Kathlyn Sacramento MD Electronically signed by Kathlyn Sacramento MD Signature Date/Time: 08/20/2022/12:45:25 PM    Final      Imaging  independently reviewed in Epic.    Raynelle Highland for [facility] [phone] pager 08/21/2022, 2:28 PM

## 2022-08-21 NOTE — TOC Initial Note (Signed)
Transition of Care [facility]) - Initial/Assessment Note    Patient Details  Name: Nicholas [name] MRN: [id number] Date of Birth: 04-17-1951  Transition of [facility] CM/SW Contact:    Tiburcio Bash, LCSW Phone Number: 08/21/2022, 1:26 PM  Clinical Narrative:                  CSW spoke with patient regarding PT recommendations of SNF, he reports he wishes to go home and when asked as to home support he stated he would have his daughter and agreed for CSW to call her. CSW spoke with daughter Sharyn Lull who reports patient lives home alone and family would not be able to care for him at home, family is all in agreement with patient going to SNF, they report their preference is [place].   CSW spoke with patient about above, he reports he is now agreeable to SNF however he wishes to go to [facility]. CSW has sent referral to [facility] pending bed offer.   Expected Discharge Plan: Skilled Nursing Facility Barriers to Discharge: Continued Medical Work up   Patient Goals and CMS Choice Patient states their goals for this hospitalization and ongoing recovery are:: to go home CMS Medicare.gov Compare Post Acute Care list provided to:: Patient Choice offered to / list presented to : Patient  Expected Discharge Plan and Services Expected Discharge Plan: [place]                                              Prior Living Arrangements/Services   Lives with:: Self                   Activities of Daily Living Home Assistive Devices/Equipment: Wheelchair, Environmental consultant (specify type) ADL Screening (condition at time of admission) Patient's cognitive ability adequate to safely complete daily activities?: Yes Is the patient deaf or have difficulty hearing?: No Does the patient have difficulty seeing, even when wearing glasses/contacts?: No Does the patient have difficulty concentrating, remembering, or making decisions?: Yes Patient able to express  need for assistance with ADLs?: Yes Does the patient have difficulty dressing or bathing?: Yes Independently performs ADLs?: No Communication: Independent Dressing (OT): Needs assistance Does the patient have difficulty walking or climbing stairs?: Yes Weakness of Legs: Both Weakness of Arms/Hands: None  Permission Sought/Granted                  Emotional Assessment              Admission diagnosis:  Weakness [R53.1] Generalized weakness [R53.1] Fall, initial encounter [W19.XXXA] COVID-19 [U07.1] Bacteremia [R78.81] Patient Active Problem List   Diagnosis Date Noted   Paroxysmal atrial fibrillation ([place]) 08/19/2022   MSSA bacteremia 08/18/2022   Bacteremia 08/18/2022   Seizure disorder ([place]) 08/17/2022   Atrial fibrillation with rapid ventricular response ([place]) 08/17/2022   Fall 08/17/2022   COVID-19 08/17/2022   Weakness 08/16/2022   Acute urinary retention 08/13/2022   History of seizure 52/77/8242   Acute metabolic encephalopathy 35/36/1443   Pneumonia due to COVID-19 virus 08/12/2022   AKI (acute kidney injury) ([place]) 08/12/2022   Rhabdomyolysis 08/12/2022   PND (post-nasal drip) 07/25/2022   Oral candida 07/15/2022   Sore throat 07/15/2022   Open wound of foot with complication, left, initial encounter 06/16/2022   Hx of excision of lamina of lumbar vertebra for  decompression of spinal cord 03/20/2022   Anemia 02/28/2022   Dysuria 02/28/2022   Peripheral edema 02/28/2022   Recurrent major depressive disorder ([place]) 02/28/2022   Chronic headache 02/28/2022   Recurrent falls 08/25/2021   Memory difficulty 07/04/2021   Chronic toe ulcer, left, with fat layer exposed ([place]) 01/23/2021   Pituitary adenoma ([place]) 09/06/2020   Chronic intractable headache 07/27/2020   Vitamin B12 deficiency 03/31/2020   Vitamin D deficiency 03/31/2020   Numbness of foot 03/25/2020   Status post amputation of great toe, left ([place]) 12/14/2019   Callus of foot 04/02/2019    Allergy to bee sting 03/11/2019   Insomnia 02/10/2019   Status post lumbar spine operative procedure for decompression of spinal cord 12/12/2018   Dyslipidemia 10/19/2018   Foot deformity 10/19/2018   Status post reverse total replacement of right shoulder 05/13/2018   Chronic diastolic CHF (congestive heart failure) ([place]) 12/27/2017   OSA (obstructive sleep apnea) 12/27/2017   CKD (chronic kidney disease) stage 3, GFR 30-59 ml/min (HCC) 10/01/2017   Fatty liver 10/01/2017   Status post reverse total replacement of left shoulder 09/03/2017   Bilateral shoulder pain 05/24/2017   History of CVA in adulthood 03/08/2017   S/P total knee arthroplasty, left 01/30/2017   Osteoarthritis of knees, bilateral 01/15/2017   Obesity, morbid, BMI 40.0-49.9 ([place]) 07/19/2016   Hypothyroidism    Iron deficiency 04/23/2016   Chronic pain syndrome 04/23/2016   Inflammatory arthritis 04/23/2016   Chronic leg pain 04/23/2016   Lesion of palate 04/23/2016   Grieving 04/23/2016   GERD (gastroesophageal reflux disease)    Duodenitis determined by biopsy 02/13/2016   Diverticulosis of colon without hemorrhage 89/38/1017   Complicated grief 51/11/5850   Migraine 01/11/2012   History of methicillin resistant staphylococcus aureus (MRSA) 07/04/2010   DDD (degenerative disc disease), lumbar 07/04/2010   History of total right knee replacement 07/04/2010   Contact dermatitis 07/04/2010   Lesion of ulnar nerve 07/04/2010   Closed fracture of malar bone ([place]) 07/04/2010   Methicillin susceptible Staphylococcus aureus infection 07/04/2010   Narrowing of intervertebral disc space 07/04/2010   Olecranon bursitis 07/04/2010   History of artificial joint 07/04/2010   Primary osteoarthritis, right shoulder 06/09/2007   Osteoarthritis 06/09/2007   Personal history of colonic adenoma 06/01/2003   PCP:  [name], MD Pharmacy:   [facility] - [place], [place] - [address] [place] [address] Phone: [phone] Fax: [phone]     Social Determinants of Health (SDOH) Interventions    Readmission Risk Interventions     No data to display

## 2022-08-21 NOTE — Consult Note (Signed)
ANTICOAGULATION CONSULT NOTE - Follow Up Consult  Pharmacy Consult for Heparin Infusion Indication: atrial fibrillation  Allergies  Allergen Reactions   Peanut-Containing Drug Products Anaphylaxis and Dermatitis   Yellow Jacket Venom [Bee Venom] Anaphylaxis and Other (See Comments)    Respiratory Distress   Celebrex [Celecoxib] Other (See Comments)    BLACK STOOL?MELENA?   Percocet [Oxycodone-Acetaminophen] Hives, Swelling and Other (See Comments)   Latex Rash   Nsaids Rash    Patient Measurements: Height: '5\' 8"'$  (172.7 cm) Weight: 113.2 kg (249 lb 9.6 oz) IBW/kg (Calculated) : 68.4 Heparin Dosing Weight: 91.8 kg  Vital Signs: Temp: 97.7 F (36.5 C) (11/07 0620) Temp Source: Oral (11/07 0620) BP: 118/75 (11/07 0620) Pulse Rate: 76 (11/07 0620)  Labs: Recent Labs    08/18/22 0829 08/18/22 1643 08/19/22 0616 08/20/22 0542  HGB 13.2  --  13.9 13.6  HCT 38.9*  --  41.1 39.4  PLT 183  --  209 214  HEPARINUNFRC 0.52 0.55 0.47 0.43  CREATININE 0.90  --  0.90 0.97     Estimated Creatinine Clearance: 85.3 mL/min (by C-G formula based on SCr of 0.97 mg/dL).   Medications:  Scheduled:   vitamin C  500 mg Oral Daily   B-complex with vitamin C  1 tablet Oral Daily   Chlorhexidine Gluconate Cloth  6 each Topical Daily   cholecalciferol  1,000 Units Oral Daily   divalproex  500 mg Oral Daily   ferrous sulfate  325 mg Oral Q breakfast   fluticasone  2 spray Each Nare Daily   furosemide  20 mg Oral q morning   gabapentin  300 mg Oral Q breakfast   And   gabapentin  600 mg Oral BID   influenza vaccine adjuvanted  0.5 mL Intramuscular Tomorrow-1000   levothyroxine  112 mcg Oral Q0600   loratadine  10 mg Oral Daily   metoprolol succinate  100 mg Oral Daily   mupirocin ointment  1 Application Nasal BID   pantoprazole  40 mg Oral QODAY   predniSONE  50 mg Oral Daily   tamsulosin  0.4 mg Oral Daily   zinc sulfate  220 mg Oral Daily   Infusions:    ceFAZolin (ANCEF) IV  2 g (08/21/22 0615)   heparin 1,350 Units/hr (08/20/22 2221)   PRN: chlorpheniramine-HYDROcodone, EPINEPHrine, guaiFENesin-dextromethorphan, magnesium hydroxide, methocarbamol, nitroGLYCERIN, ondansetron **OR** ondansetron (ZOFRAN) IV, promethazine, tiZANidine, traZODone  Assessment: Nicholas [name] is a 71 y.o. male presenting with Afib with RVR. PMH significant for stroke, MI with nonobstructive CAD, questionable CHF, gastric ulcers, and OSA. Patient was not on [facility] PTA per chart review. Per cardiology, will switch from heparin infusion to apixaban. Pharmacy has been consulted to initiate and manage heparin infusion.   Baseline labs: aPTT 30, INR 1.3, Hgb 13.4, Plts 168   Goal of Therapy:  Heparin level 0.3-0.7 units/ml Monitor platelets by anticoagulation protocol: Yes   Plan:  Discontinue heparin infusion. Switch to apixaban 5 mg BID. Pharmacy will run test claim and educate the patient prior to discharge.  Gretel Acre, PharmD PGY1 Pharmacy Resident 08/21/2022 7:28 AM

## 2022-08-21 NOTE — NC FL2 (Signed)
[place] LEVEL OF CARE SCREENING TOOL     IDENTIFICATION  Patient Name: Nicholas [name] Birthdate: 1951/01/20 Sex: male Admission Date (Current Location): 08/16/2022  [facility] and [place] Number:  Engineering geologist and Address:  [facility], [address] [place], [place] [address]      Provider Number: [id number]  Attending Physician Name and Address:  Emeterio Reeve, DO  Relative Name and Phone Number:  Sharyn Lull (daughter)  [phone]    Current Level of Care: Hospital Recommended Level of Care: [place] Prior Approval Number:    Date Approved/Denied:   PASRR Number: [id number]  Discharge Plan: SNF    Current Diagnoses: Patient Active Problem List   Diagnosis Date Noted   Paroxysmal atrial fibrillation ([place]) 08/19/2022   MSSA bacteremia 08/18/2022   Bacteremia 08/18/2022   Seizure disorder ([place]) 08/17/2022   Atrial fibrillation with rapid ventricular response ([place]) 08/17/2022   Fall 08/17/2022   COVID-19 08/17/2022   Weakness 08/16/2022   Acute urinary retention 08/13/2022   History of seizure 94/49/6759   Acute metabolic encephalopathy 16/38/4665   Pneumonia due to COVID-19 virus 08/12/2022   AKI (acute kidney injury) ([place]) 08/12/2022   Rhabdomyolysis 08/12/2022   PND (post-nasal drip) 07/25/2022   Oral candida 07/15/2022   Sore throat 07/15/2022   Open wound of foot with complication, left, initial encounter 06/16/2022   Hx of excision of lamina of lumbar vertebra for decompression of spinal cord 03/20/2022   Anemia 02/28/2022   Dysuria 02/28/2022   Peripheral edema 02/28/2022   Recurrent major depressive disorder ([place]) 02/28/2022   Chronic headache 02/28/2022   Recurrent falls 08/25/2021   Memory difficulty 07/04/2021   Chronic toe ulcer, left, with fat layer exposed ([place]) 01/23/2021   Pituitary adenoma ([place]) 09/06/2020   Chronic intractable headache 07/27/2020   Vitamin B12 deficiency  03/31/2020   Vitamin D deficiency 03/31/2020   Numbness of foot 03/25/2020   Status post amputation of great toe, left ([place]) 12/14/2019   Callus of foot 04/02/2019   Allergy to bee sting 03/11/2019   Insomnia 02/10/2019   Status post lumbar spine operative procedure for decompression of spinal cord 12/12/2018   Dyslipidemia 10/19/2018   Foot deformity 10/19/2018   Status post reverse total replacement of right shoulder 05/13/2018   Chronic diastolic CHF (congestive heart failure) ([place]) 12/27/2017   OSA (obstructive sleep apnea) 12/27/2017   CKD (chronic kidney disease) stage 3, GFR 30-59 ml/min (HCC) 10/01/2017   Fatty liver 10/01/2017   Status post reverse total replacement of left shoulder 09/03/2017   Bilateral shoulder pain 05/24/2017   History of CVA in adulthood 03/08/2017   S/P total knee arthroplasty, left 01/30/2017   Osteoarthritis of knees, bilateral 01/15/2017   Obesity, morbid, BMI 40.0-49.9 ([place]) 07/19/2016   Hypothyroidism    Iron deficiency 04/23/2016   Chronic pain syndrome 04/23/2016   Inflammatory arthritis 04/23/2016   Chronic leg pain 04/23/2016   Lesion of palate 04/23/2016   Grieving 04/23/2016   GERD (gastroesophageal reflux disease)    Duodenitis determined by biopsy 02/13/2016   Diverticulosis of colon without hemorrhage 99/35/7017   Complicated grief 79/39/0300   Migraine 01/11/2012   History of methicillin resistant staphylococcus aureus (MRSA) 07/04/2010   DDD (degenerative disc disease), lumbar 07/04/2010   History of total right knee replacement 07/04/2010   Contact dermatitis 07/04/2010   Lesion of ulnar nerve 07/04/2010   Closed fracture of malar bone ([place]) 07/04/2010   Methicillin susceptible Staphylococcus aureus infection  07/04/2010   Narrowing of intervertebral disc space 07/04/2010   Olecranon bursitis 07/04/2010   History of artificial joint 07/04/2010   Primary osteoarthritis, right shoulder 06/09/2007   Osteoarthritis 06/09/2007    Personal history of colonic adenoma 06/01/2003    Orientation RESPIRATION BLADDER Height & Weight     Time, Self, Situation, Place  Normal Continent Weight: 249 lb 9.6 oz (113.2 kg) Height:  '5\' 8"'$  (172.7 cm)  BEHAVIORAL SYMPTOMS/MOOD NEUROLOGICAL BOWEL NUTRITION STATUS      Continent Diet (see discharge summary)  AMBULATORY STATUS COMMUNICATION OF NEEDS Skin   Limited Assist Verbally Other (Comment) (abrasion arm)                       Personal Care Assistance Level of Assistance  Bathing, Feeding, Dressing, Total care Bathing Assistance: Limited assistance Feeding assistance: Independent Dressing Assistance: Limited assistance Total Care Assistance: Limited assistance   Functional Limitations Info  Sight, Hearing, Speech Sight Info: Impaired Hearing Info: Adequate Speech Info: Adequate    SPECIAL CARE FACTORS FREQUENCY  PT (By licensed PT), OT (By licensed OT)     PT Frequency: min 4x weekly OT Frequency: min 4x weekly            Contractures Contractures Info: Not present    Additional Factors Info  Code Status, Allergies Code Status Info: full Allergies Info: Peanut-containing Drug Products  Yellow Jacket Venom (Bee Venom)  Celebrex (Celecoxib)  Percocet (Oxycodone-acetaminophen)  Latex  Nsaids           Current Medications (08/21/2022):  This is the current hospital active medication list Current Facility-Administered Medications  Medication Dose Route Frequency Provider Last Rate Last Admin   apixaban (ELIQUIS) tablet 5 mg  5 mg Oral BID Coulter, Hoyle Sauer, RPH   5 mg at 08/21/22 4431   ascorbic acid (VITAMIN C) tablet 500 mg  500 mg Oral Daily Mansy, Jan A, MD   500 mg at 08/21/22 5400   B-complex with vitamin C tablet 1 tablet  1 tablet Oral Daily Mansy, Jan A, MD   1 tablet at 08/21/22 0820   ceFAZolin (ANCEF) IVPB 2g/100 mL premix  2 g Intravenous Q8H Sharion Settler, NP   Stopped at 08/21/22 0645   Chlorhexidine Gluconate Cloth 2 % PADS 6 each  6  each Topical Daily Emeterio Reeve, DO   6 each at 08/21/22 8676   chlorpheniramine-HYDROcodone (TUSSIONEX) 10-8 MG/5ML suspension 5 mL  5 mL Oral Q12H PRN Mansy, Jan A, MD       cholecalciferol (VITAMIN D3) tablet 1,000 Units  1,000 Units Oral Daily Mansy, Jan A, MD   1,000 Units at 08/21/22 0820   divalproex (DEPAKOTE ER) 24 hr tablet 500 mg  500 mg Oral Daily Mansy, Jan A, MD   500 mg at 08/21/22 1950   EPINEPHrine (EPI-PEN) injection 0.3 mg  0.3 mg Intramuscular PRN Mansy, Jan A, MD       ferrous sulfate tablet 325 mg  325 mg Oral Q breakfast Mansy, Jan A, MD   325 mg at 08/21/22 0821   fluticasone (FLONASE) 50 MCG/ACT nasal spray 2 spray  2 spray Each Nare Daily Mansy, Jan A, MD   2 spray at 08/21/22 9326   furosemide (LASIX) tablet 20 mg  20 mg Oral q morning Mansy, Jan A, MD   20 mg at 08/21/22 7124   gabapentin (NEURONTIN) capsule 300 mg  300 mg Oral Q breakfast Renda Rolls, RPH   300 mg  at 08/21/22 0820   And   gabapentin (NEURONTIN) capsule 600 mg  600 mg Oral BID Renda Rolls, RPH   600 mg at 08/21/22 1242   guaiFENesin-dextromethorphan (ROBITUSSIN DM) 100-10 MG/5ML syrup 10 mL  10 mL Oral Q4H PRN Mansy, Jan A, MD   10 mL at 08/18/22 1014   influenza vaccine adjuvanted (FLUAD) injection 0.5 mL  0.5 mL Intramuscular Tomorrow-1000 Emeterio Reeve, DO       levothyroxine (SYNTHROID) tablet 112 mcg  112 mcg Oral Q0600 Mansy, Jan A, MD   112 mcg at 08/21/22 0618   loratadine (CLARITIN) tablet 10 mg  10 mg Oral Daily Mansy, Jan A, MD   10 mg at 08/21/22 0820   magnesium hydroxide (MILK OF MAGNESIA) suspension 30 mL  30 mL Oral Daily PRN Mansy, Jan A, MD       methocarbamol (ROBAXIN) tablet 500 mg  500 mg Oral Q8H PRN Mansy, Jan A, MD       metoprolol succinate (TOPROL-XL) 24 hr tablet 100 mg  100 mg Oral Daily Mansy, Jan A, MD   100 mg at 08/21/22 8841   mupirocin ointment (BACTROBAN) 2 % 1 Application  1 Application Nasal BID Emeterio Reeve, DO   1 Application at 66/06/30  0823   nitroGLYCERIN (NITROSTAT) SL tablet 0.4 mg  0.4 mg Sublingual Q5 min PRN Mansy, Jan A, MD       ondansetron [facility]) tablet 4 mg  4 mg Oral Q6H PRN Mansy, Jan A, MD       Or   ondansetron [facility]) injection 4 mg  4 mg Intravenous Q6H PRN Mansy, Jan A, MD       pantoprazole (PROTONIX) EC tablet 40 mg  40 mg Oral QODAY Mansy, Jan A, MD   40 mg at 08/21/22 1601   promethazine (PHENERGAN) suppository 25 mg  25 mg Rectal Q6H PRN Emeterio Reeve, DO       tamsulosin [facility]) capsule 0.4 mg  0.4 mg Oral Daily Mansy, Jan A, MD   0.4 mg at 08/21/22 0932   tiZANidine (ZANAFLEX) tablet 4 mg  4 mg Oral Q6H PRN Mansy, Jan A, MD   4 mg at 08/19/22 0545   traZODone (DESYREL) tablet 25 mg  25 mg Oral QHS PRN Mansy, Jan A, MD       zinc sulfate capsule 220 mg  220 mg Oral Daily Mansy, Jan A, MD   220 mg at 08/21/22 3557     Discharge Medications: Please see discharge summary for a list of discharge medications.  Relevant Imaging Results:  Relevant Lab Results:   Additional Information SSN: [id number]  Tiburcio Bash, LCSW

## 2022-08-21 NOTE — TOC Benefit Eligibility Note (Addendum)
Patient Teacher, English as a foreign language completed.    The patient is currently admitted and upon discharge could be taking Eliquis 5 mg.  The current 30 day co-pay is $0.00.   The patient is currently admitted and upon discharge could be taking Farxiga 10 mg.  The current 30 day co-pay is $0.00.   The patient is insured through [place], [place] Patient Advocate Specialist [place] Patient Advocate Team Direct Number: [phone]  Fax: [phone]

## 2022-08-21 NOTE — Progress Notes (Addendum)
Progress Note  Patient Name: Nicholas [name] Date of Encounter: 08/21/2022  Primary Cardiologist: new - consult by End   Subjective   No chest pain, dyspnea, or palpitations. Remains in Afib with controlled ventricular response, brief episodes of RVR overnight, asymptomatic.   Inpatient Medications    Scheduled Meds:  apixaban  5 mg Oral BID   vitamin C  500 mg Oral Daily   B-complex with vitamin C  1 tablet Oral Daily   Chlorhexidine Gluconate Cloth  6 each Topical Daily   cholecalciferol  1,000 Units Oral Daily   divalproex  500 mg Oral Daily   ferrous sulfate  325 mg Oral Q breakfast   fluticasone  2 spray Each Nare Daily   furosemide  20 mg Oral q morning   gabapentin  300 mg Oral Q breakfast   And   gabapentin  600 mg Oral BID   influenza vaccine adjuvanted  0.5 mL Intramuscular Tomorrow-1000   levothyroxine  112 mcg Oral Q0600   loratadine  10 mg Oral Daily   metoprolol succinate  100 mg Oral Daily   mupirocin ointment  1 Application Nasal BID   pantoprazole  40 mg Oral QODAY   predniSONE  50 mg Oral Daily   tamsulosin  0.4 mg Oral Daily   zinc sulfate  220 mg Oral Daily   Continuous Infusions:   ceFAZolin (ANCEF) IV Stopped (08/21/22 0645)   PRN Meds: chlorpheniramine-HYDROcodone, EPINEPHrine, guaiFENesin-dextromethorphan, magnesium hydroxide, methocarbamol, nitroGLYCERIN, ondansetron **OR** ondansetron (ZOFRAN) IV, promethazine, tiZANidine, traZODone   Vital Signs    Vitals:   08/20/22 1940 08/20/22 2313 08/21/22 0620 08/21/22 0838  BP: (!) 103/58 133/70 118/75 114/85  Pulse: 67 75 76 68  Resp: '17 17 17 18  '$ Temp: 98 F (36.7 C) 98.3 F (36.8 C) 97.7 F (36.5 C) 97.7 F (36.5 C)  TempSrc: Oral Oral Oral   SpO2: 95% 97% 98% 95%  Weight:   113.2 kg   Height:        Intake/Output Summary (Last 24 hours) at 08/21/2022 0940 Last data filed at 08/21/2022 0838 Gross per 24 hour  Intake 2929.32 ml  Output 550 ml  Net 2379.32 ml    Filed Weights    08/17/22 0832 08/21/22 0620  Weight: 106.6 kg 113.2 kg    Telemetry    Afib in the 70s to 80s bpm for the most part with brief episodes of RVR overnight, artifact - Personally Reviewed  ECG    No new tracings - Personally Reviewed  Physical Exam   GEN: No acute distress.   Neck: No JVD. Cardiac: IRIR, no murmurs, rubs, or gallops.  Respiratory: Improved breath sounds bilaterally.  GI: Soft, nontender, non-distended.   MS: No edema; No deformity. Neuro:  Alert and oriented x 3; Nonfocal.  Psych: Normal affect.  Labs    Chemistry Recent Labs  Lab 08/19/22 0616 08/20/22 0542 08/21/22 0804  NA 137 137 136  K 4.0 3.9 4.0  CL 103 105 104  CO2 '26 26 28  '$ GLUCOSE 178* 156* 132*  BUN 27* 37* 35*  CREATININE 0.90 0.97 0.91  CALCIUM 7.8* 7.8* 7.7*  PROT 5.7* 5.6* 5.4*  ALBUMIN 2.6* 2.5* 2.5*  AST 29 37 49*  ALT 41 54* 69*  ALKPHOS 47 46 45  BILITOT 1.0 0.9 0.9  GFRNONAA >60 >60 >60  ANIONGAP 8 6 4*      Hematology Recent Labs  Lab 08/19/22 0616 08/20/22 0542 08/21/22 0804  WBC 8.1 10.6*  14.7*  RBC 4.40 4.29 4.49  HGB 13.9 13.6 14.0  HCT 41.1 39.4 41.1  MCV 93.4 91.8 91.5  MCH 31.6 31.7 31.2  MCHC 33.8 34.5 34.1  RDW 12.8 12.8 12.9  PLT 209 214 207     Cardiac EnzymesNo results for input(s): "TROPONINI" in the last 168 hours. No results for input(s): "TROPIPOC" in the last 168 hours.   BNPNo results for input(s): "BNP", "PROBNP" in the last 168 hours.   DDimer No results for input(s): "DDIMER" in the last 168 hours.   Radiology     Cardiac Studies   2D echo 08/20/2022: 1. Left ventricular ejection fraction, by estimation, is 55 to 60%. The  left ventricle has normal function. Left ventricular endocardial border  not optimally defined to evaluate regional wall motion. Left ventricular  diastolic parameters are  indeterminate.   2. Right ventricular systolic function is normal. The right ventricular  size is normal.   3. Left atrial size was  mildly dilated.   4. The mitral valve is normal in structure. Moderate mitral valve  regurgitation. No evidence of mitral stenosis.   5. The aortic valve is normal in structure. Aortic valve regurgitation is  mild. No aortic stenosis is present.   6. Aortic dilatation noted. There is mild dilatation of the ascending  aorta, measuring 42 mm.   Patient Profile     71 y.o. male with history of MI with nonobstructive CAD, CVA, questionable CHF, gastric ulcers, and OSA with recent Covid PNA who we are seeing of Afib with RVR and elevated troponin.   Assessment & Plan    PAF: -Remains in Afib with controlled ventricular response -In the setting of Covid infection and MSSA bacteremia  -CHADS2VASc 3-4 (age x 1, ?CHF, CVA x 2) -Now on Eliquis 5 mg bid in place of heparin gtt  -Given history of gastric ulcers, PPI -Toprol XL -TSH normal -If ventricular rates continue to remain well controlled, would favor DCCV in the outpatient setting after he has been adequately anticoagulated, without interruption, for at least 4 weeks, and after he has recovered from his acute illness -If his ventricular rates become difficult to control, would need TEE/DCCV prior to discharge    Elevated high sensitivity troponin: -Minimally elevated and down trending -Felt to represent supply demand ischemia in the setting of recent Covid infection and Afib with RVR -Echo pending, if preserved LVSF no plans for inpatient ischemic evaluation -LHC in 2019 showed no significant CAD -Consider outpatient coronary CTA   MSSA bacteremia/Covid: -Per primary team and ID -Would prefer not to do TEE while he has active Covid given aerosolizing procedure, reactive airway -Will need TEE, though preference is to wait 2 weeks from the time of Covid diagnosis and can potentially be done as an outpatient, ID is wondering if TEE can be performed prior to discharge, will discuss with rounding MD and update ID      For questions or  updates, please contact [facility] Please consult [email or web address] for contact info under Cardiology/STEMI.    Signed, Christell Faith, PA-C [place] Pager: [phone] 08/21/2022, 9:40 AM

## 2022-08-21 NOTE — Chronic Care Management (AMB) (Signed)
Chronic Care Management Pharmacy Assistant   Name: Nicholas [name]  MRN: [id number] DOB: Dec 03, 1950  Reason for Encounter: Medication Adherence and Delivery Coordination    Patient now in SNF  Recent office visits:  07/25/22-Nicholas Cable,NP(fam med)- sore throat,start cetirizine '10mg'$   1 tablet daily and fluticasone  2 sprays each nostril daily    Recent consult visits:  None since last CCM contact   Hospital visits:  Medication Reconciliation was completed by comparing discharge summary, patient's EMR and Pharmacy list, and upon discussion with patient.  Admitted to the hospital on 08/26/22 due to MSSA Bacteremia. Discharge date was 08/28/22. Discharged from [facility].    Medication Changes at Hospital Discharge: -Changed   ancef  Metoprolol succinate  Medications Discontinued at Hospital Discharge: -Stopped  [place]ora Shampoo  Nystatin   Medications that remain the same after Hospital Discharge:??  -All other medications will remain the same.     Admitted to the hospital on 08/16/22 due to MSSA Bacteremia. Discharge date was 08/25/22. Discharged from [facility].  [place]?Medications Started at [facility] Discharge:?? -started Eliquis  Ancef  linezolid  Medications Discontinued at Hospital Discharge: -Stopped paxlovid  Medications that remain the same after Hospital Discharge:??  -All other medications will remain the same.     Admitted to the hospital on 08/12/22 due to Pneumonia. Discharge date was 08/15/22. Discharged from [facility].    New?Medications Started at [facility] Discharge:?? -started Paxlovid  Tamsulosin   Medications that remain the same after Hospital Discharge:??  -All other medications will remain the same.   Medications: Facility-Administered Encounter Medications as of 08/21/2022  Medication   apixaban (ELIQUIS) tablet 5 mg   ascorbic acid (VITAMIN C) tablet 500 mg   B-complex with vitamin C tablet 1 tablet   ceFAZolin (ANCEF) IVPB  2g/100 mL premix   Chlorhexidine Gluconate Cloth 2 % PADS 6 each   chlorpheniramine-HYDROcodone (TUSSIONEX) 10-8 MG/5ML suspension 5 mL   cholecalciferol (VITAMIN D3) tablet 1,000 Units   divalproex (DEPAKOTE ER) 24 hr tablet 500 mg   EPINEPHrine (EPI-PEN) injection 0.3 mg   ferrous sulfate tablet 325 mg   fluticasone (FLONASE) 50 MCG/ACT nasal spray 2 spray   furosemide (LASIX) tablet 20 mg   gabapentin (NEURONTIN) capsule 300 mg   And   gabapentin (NEURONTIN) capsule 600 mg   guaiFENesin-dextromethorphan (ROBITUSSIN DM) 100-10 MG/5ML syrup 10 mL   influenza vaccine adjuvanted (FLUAD) injection 0.5 mL   levothyroxine (SYNTHROID) tablet 112 mcg   loratadine (CLARITIN) tablet 10 mg   magnesium hydroxide (MILK OF MAGNESIA) suspension 30 mL   methocarbamol (ROBAXIN) tablet 500 mg   metoprolol succinate (TOPROL-XL) 24 hr tablet 100 mg   mupirocin ointment (BACTROBAN) 2 % 1 Application   nitroGLYCERIN (NITROSTAT) SL tablet 0.4 mg   ondansetron (ZOFRAN) tablet 4 mg   Or   ondansetron (ZOFRAN) injection 4 mg   pantoprazole (PROTONIX) EC tablet 40 mg   predniSONE (DELTASONE) tablet 50 mg   promethazine (PHENERGAN) suppository 25 mg   tamsulosin (FLOMAX) capsule 0.4 mg   tiZANidine (ZANAFLEX) tablet 4 mg   traZODone (DESYREL) tablet 25 mg   zinc sulfate capsule 220 mg   Outpatient Encounter Medications as of 08/21/2022  Medication Sig   acetaminophen (TYLENOL) 500 MG tablet Take 1,000 mg by mouth 3 (three) times daily.   amitriptyline (ELAVIL) 50 MG tablet TAKE TWO TABLETS BY MOUTH EVERYDAY AT BEDTIME (Patient taking differently: Take 100 mg by mouth at bedtime.)   atorvastatin (LIPITOR) 20  MG tablet TAKE ONE TABLET BY MOUTH ONCE DAILY   B Complex Vitamins (B-COMPLEX/B-12 PO) Take 1 tablet by mouth daily.   cetirizine (ZYRTEC) 10 MG tablet Take 1 tablet (10 mg total) by mouth daily.   Cholecalciferol (VITAMIN D3) 25 MCG (1000 UT) CAPS Take 1 capsule (1,000 Units total) by mouth daily.    clotrimazole (LOTRIMIN) 1 % cream Apply 1 application topically 2 (two) times daily. To groin (Patient taking differently: Apply 1 application  topically 2 (two) times daily as needed (rash).)   divalproex (DEPAKOTE ER) 500 MG 24 hr tablet TAKE ONE TABLET BY MOUTH EVERYDAY AT BEDTIME   EPINEPHrine 0.3 mg/0.3 mL IJ SOAJ injection Inject 0.3 mg into the muscle as needed for anaphylaxis. (bee stings)   ferrous sulfate 325 (65 FE) MG EC tablet Take 325 mg by mouth daily with breakfast.   fluticasone (FLONASE) 50 MCG/ACT nasal spray Place 2 sprays into both nostrils daily.   Fremanezumab-vfrm (AJOVY) 225 MG/1.5ML SOAJ Inject 225 mg into the skin every 30 (thirty) days.   furosemide (LASIX) 20 MG tablet TAKE ONE TABLET BY MOUTH EVERY MORNING   gabapentin (NEURONTIN) 300 MG capsule TAKE ONE CAPSULE BY MOUTH EVERY MORNING and TAKE TWO CAPSULES BY MOUTH AT NOON and TAKE TWO CAPSULES BY MOUTH EVERY EVENING   ketoconazole (NIZORAL) 2 % shampoo Apply 1 application topically 3 (three) times a week.   levothyroxine (SYNTHROID) 112 MCG tablet Take 1 tablet (112 mcg total) by mouth daily before breakfast.   methocarbamol (ROBAXIN) 500 MG tablet TAKE ONE TABLET BY MOUTH twice daily AS NEEDED FOR muscle SPASMS   metoprolol succinate (TOPROL-XL) 100 MG 24 hr tablet TAKE ONE TABLET BY MOUTH EVERYDAY AT BEDTIME   nitroGLYCERIN (NITROSTAT) 0.4 MG SL tablet Place 1 tablet (0.4 mg total) under the tongue every 5 (five) minutes as needed for chest pain.   nystatin (MYCOSTATIN) 100000 UNIT/ML suspension Take 5 mLs (500,000 Units total) by mouth 4 (four) times daily.   ondansetron (ZOFRAN-ODT) 4 MG disintegrating tablet TAKE ONE TABLET BY MOUTH EVERY 8 HOURS AS NEEDED   pantoprazole (PROTONIX) 40 MG tablet Take 1 tablet (40 mg total) by mouth every other day.   tamsulosin (FLOMAX) 0.4 MG CAPS capsule Take 1 capsule (0.4 mg total) by mouth daily.   tiZANidine (ZANAFLEX) 4 MG tablet TAKE ONE TABLET BY MOUTH every SIX hours AS  NEEDED FOR acute HEADACHE   Ubrogepant (UBRELVY) 100 MG TABS Take 100 mg by mouth as needed (take 1 tablet at onset of headache, may repeat in hours, max is 200 mg in 24 hours).    BP Readings from Last 3 Encounters:  08/21/22 114/85  08/15/22 134/64  07/25/22 110/76    Lab Results  Component Value Date   HGBA1C 5.6 07/13/2010      Unsuccessful outreach for Medication Adherence and Delivery Coordination as patient now in SNF.     [name], CPP notified  [name], [place]  [phone]

## 2022-08-22 ENCOUNTER — Other Ambulatory Visit (HOSPITAL_COMMUNITY): Payer: Self-pay

## 2022-08-22 ENCOUNTER — Ambulatory Visit: Payer: Medicare Other | Admitting: Family Medicine

## 2022-08-22 DIAGNOSIS — I4891 Unspecified atrial fibrillation: Secondary | ICD-10-CM

## 2022-08-22 DIAGNOSIS — R531 Weakness: Secondary | ICD-10-CM | POA: Diagnosis not present

## 2022-08-22 DIAGNOSIS — I4819 Other persistent atrial fibrillation: Secondary | ICD-10-CM

## 2022-08-22 DIAGNOSIS — W19XXXA Unspecified fall, initial encounter: Secondary | ICD-10-CM | POA: Diagnosis not present

## 2022-08-22 DIAGNOSIS — B9561 Methicillin susceptible Staphylococcus aureus infection as the cause of diseases classified elsewhere: Secondary | ICD-10-CM | POA: Diagnosis not present

## 2022-08-22 DIAGNOSIS — R7881 Bacteremia: Secondary | ICD-10-CM | POA: Diagnosis not present

## 2022-08-22 DIAGNOSIS — U071 COVID-19: Secondary | ICD-10-CM | POA: Diagnosis not present

## 2022-08-22 LAB — CBC
HCT: 44.8 % (ref 39.0–52.0)
Hemoglobin: 15.1 g/dL (ref 13.0–17.0)
MCH: 31.8 pg (ref 26.0–34.0)
MCHC: 33.7 g/dL (ref 30.0–36.0)
MCV: 94.3 fL (ref 80.0–100.0)
Platelets: 134 10*3/uL — ABNORMAL LOW (ref 150–400)
RBC: 4.75 MIL/uL (ref 4.22–5.81)
RDW: 13 % (ref 11.5–15.5)
WBC: 12.7 10*3/uL — ABNORMAL HIGH (ref 4.0–10.5)
nRBC: 0 % (ref 0.0–0.2)

## 2022-08-22 LAB — COMPREHENSIVE METABOLIC PANEL
ALT: 69 U/L — ABNORMAL HIGH (ref 0–44)
AST: 42 U/L — ABNORMAL HIGH (ref 15–41)
Albumin: 2.6 g/dL — ABNORMAL LOW (ref 3.5–5.0)
Alkaline Phosphatase: 50 U/L (ref 38–126)
Anion gap: 6 (ref 5–15)
BUN: 30 mg/dL — ABNORMAL HIGH (ref 8–23)
CO2: 26 mmol/L (ref 22–32)
Calcium: 7.9 mg/dL — ABNORMAL LOW (ref 8.9–10.3)
Chloride: 104 mmol/L (ref 98–111)
Creatinine, Ser: 1 mg/dL (ref 0.61–1.24)
GFR, Estimated: >60 mL/min (ref >60)
Glucose, Bld: 105 mg/dL — ABNORMAL HIGH (ref 70–99)
Potassium: 4.9 mmol/L (ref 3.5–5.1)
Sodium: 136 mmol/L (ref 135–145)
Total Bilirubin: 1.1 mg/dL (ref 0.3–1.2)
Total Protein: 5.7 g/dL — ABNORMAL LOW (ref 6.5–8.1)

## 2022-08-22 NOTE — Progress Notes (Addendum)
PROGRESS NOTE  Nicholas [name]    DOB: 12/31/1950, 71 y.o.  WFU:[id number]    Code Status: Full Code   DOA: 08/16/2022   LOS: 4   Brief hospital course  Nicholas [name] is a 71 y.o. male with a PMH significant for CHF, hypothyroidism, HLD, seizure disorder, Afib RVR, covid-19, Type 2 DM.  They presented from home to the ED on 08/16/2022 with generalized weakness and falls x a couple days. Also endorsed cough. Diagnosed with COVID-19 4 days prior to presentation and was taking paxlovid to complete on 11/6.  In the ED, it was found that they had hypotensive with systolic BP in the 20U and his pulse oximetry was in the 80s.  He was also noted to be tachycardic with heart rate of 140. Labs revealed borderline potassium of 3.5 and a BUN of 27 with albumin 3.3, AST 65 and ALT 48.  Total CK was 450 and high sensitive troponin I was 56.  CBC showed leukocytosis of 13 with neutrophilia.  Blood cultures were drawn.  UA was negative.  Significant findings included EKG showed atrial fibrillation with rapid ventricular sponsor 117.Portable chest ray showed cardiomegaly with mild central congestion with improved overall appearance compared to 08/12/2022 with patchy atelectasis at the left base.   They were initially treated with 4 mg of IV Zofran and 1 L bolus of IV normal saline. Cardiology was consulted.   Patient was admitted to medicine service for further workup and management of acute hypoxic respiratory failure as outlined in detail below.  08/22/22 -feeling improved. Wanting to be discharged as soon as possible  Assessment & Plan  Principal Problem:   MSSA bacteremia Active Problems:   Chronic diastolic CHF (congestive heart failure) (HCC)   Hypothyroidism   Dyslipidemia   Weakness   Seizure disorder (HCC)   Atrial fibrillation with rapid ventricular response ([place])   Fall   COVID-19   Bacteremia   Paroxysmal atrial fibrillation (HCC)   Type 2 diabetes mellitus with foot ulcer, without  long-term current use of insulin ([place])  COVID-19 w/ acute hypoxic respiratory failure and pneumonia. Day 10 since diagnosis. Currently ORA - discontinue PPE precautions, per hospital policy Completed Paxlovid Completed Steroids  PT/OT for physical deconditioning   Atrial Fibrillation w/ RVR  HFpEF (EF 55-60%)  hypotension (resolved)- CHADS2VASc 3-4 Cardiology following, appreciate care TEE planned with cardioversion Continue eliquis Rate control w/ metoprolol Continue eliquis   Elevated high sensitivity troponin: Likely demand ischemia in the setting of recent Covid infection and Afib with RVR LHC in 2019 showed no significant CAD Cardiology following    MSSA Bacteremia- with unknown source. Confirmed on 2 BxCxs. TEE planned to guide antibiotic course length decision continue Cefazolin IV 08/18/22- ID following, appreciate your care Possible PICC vs PO on dc  Migraine 11/05 - resolved Continue depakote ppx NSAID allergy - can't have this HTN - can't safely given triptan until BP improved Ordered phenergan Consider Fioricet     Hypothyroidism Synthroid.   Seizure disorder (HCC) Hx migraine  Depakote DR.   Dyslipidemia hold off statin therapy given elevated CK level which could be related to mild rhabdomyolysis vs COVID inflammation  Body mass index is 37.95 kg/m.  VTE ppx:  apixaban (ELIQUIS) tablet 5 mg   Diet:     Diet   Diet Heart Room service appropriate? Yes; Fluid consistency: Thin   Consultants: Cardiology ID Subjective 08/22/22    Pt reports feeling overall well. Is anxiously anticipating discharge as soon  as possible   Objective   Vitals:   08/21/22 1755 08/21/22 2103 08/22/22 0057 08/22/22 0610  BP: 97/63 101/80 129/80 (!) 94/58  Pulse: 80 96 (!) 53 74  Resp: '18 18 18 18  '$ Temp: 97.8 F (36.6 C) 98.3 F (36.8 C) 97.6 F (36.4 C) 97.7 F (36.5 C)  TempSrc: Oral Oral Oral Oral  SpO2: 95% 97% 97% 97%  Weight:      Height:         Intake/Output Summary (Last 24 hours) at 08/22/2022 0811 Last data filed at 08/21/2022 2100 Gross per 24 hour  Intake 600 ml  Output 525 ml  Net 75 ml   Filed Weights   08/17/22 0832 08/21/22 0620  Weight: 106.6 kg 113.2 kg     Physical Exam:  General: awake, alert, NAD HEENT: atraumatic, clear conjunctiva, anicteric sclera, MMM, hearing grossly normal Respiratory: normal respiratory effort. Cardiovascular: quick capillary refill, normal S1/S2, RRR, no JVD, murmurs Nervous: A&O x3. no gross focal neurologic deficits, normal speech Extremities: moves all equally, no edema, normal tone Skin: dry, intact, normal temperature, normal color. No rashes, lesions or ulcers on exposed skin Psychiatry: normal mood, congruent affect  Labs   I have personally reviewed the following labs and imaging studies CBC    Component Value Date/Time   WBC 12.7 (H) 08/22/2022 0731   RBC 4.75 08/22/2022 0731   HGB 15.1 08/22/2022 0731   HGB 15.8 01/16/2022 1415   HCT 44.8 08/22/2022 0731   HCT 45.5 01/16/2022 1415   PLT 134 (L) 08/22/2022 0731   PLT 213 01/16/2022 1415   MCV 94.3 08/22/2022 0731   MCV 92 01/16/2022 1415   MCH 31.8 08/22/2022 0731   MCHC 33.7 08/22/2022 0731   RDW 13.0 08/22/2022 0731   RDW 12.6 01/16/2022 1415   LYMPHSABS 0.6 (L) 08/20/2022 0542   LYMPHSABS 1.6 01/16/2022 1415   MONOABS 1.1 (H) 08/20/2022 0542   EOSABS 0.0 08/20/2022 0542   EOSABS 0.3 01/16/2022 1415   BASOSABS 0.0 08/20/2022 0542   BASOSABS 0.1 01/16/2022 1415      Latest Ref Rng & Units 08/21/2022    8:04 AM 08/20/2022    5:42 AM 08/19/2022    6:16 AM  BMP  Glucose 70 - 99 mg/dL 132  156  178   BUN 8 - 23 mg/dL 35  37  27   Creatinine 0.61 - 1.24 mg/dL 0.91  0.97  0.90   Sodium 135 - 145 mmol/L 136  137  137   Potassium 3.5 - 5.1 mmol/L 4.0  3.9  4.0   Chloride 98 - 111 mmol/L 104  105  103   CO2 22 - 32 mmol/L '28  26  26   '$ Calcium 8.9 - 10.3 mg/dL 7.7  7.8  7.8     ECHOCARDIOGRAM  COMPLETE  Result Date: 08/20/2022    ECHOCARDIOGRAM REPORT   Patient Name:   Nicholas [name] Date of Exam: 08/20/2022 Medical Rec #:  [id number]      Height:       68.0 in Accession #:    [id number]     Weight:       235.0 lb Date of Birth:  1951/03/29       BSA:          2.189 m Patient Age:    35 years       BP:           121/79 mmHg Patient Gender: M  HR:           72 bpm. Exam Location:  [facility] Procedure: 2D Echo and Intracardiac Opacification Agent Indications:     Atrial Fibrillation I48.91  History:         Patient has prior history of Echocardiogram examinations, most                  recent 01/09/2018. CHF; CAD and Previous Myocardial Infarction.  Sonographer:     Mikki Santee RDCS Referring Phys:  [id number] [place] Diagnosing Phys: Kathlyn Sacramento MD IMPRESSIONS  1. Left ventricular ejection fraction, by estimation, is 55 to 60%. The left ventricle has normal function. Left ventricular endocardial border not optimally defined to evaluate regional wall motion. Left ventricular diastolic parameters are indeterminate.  2. Right ventricular systolic function is normal. The right ventricular size is normal.  3. Left atrial size was mildly dilated.  4. The mitral valve is normal in structure. Moderate mitral valve regurgitation. No evidence of mitral stenosis.  5. The aortic valve is normal in structure. Aortic valve regurgitation is mild. No aortic stenosis is present.  6. Aortic dilatation noted. There is mild dilatation of the ascending aorta, measuring 42 mm. FINDINGS  Left Ventricle: Left ventricular ejection fraction, by estimation, is 55 to 60%. The left ventricle has normal function. Left ventricular endocardial border not optimally defined to evaluate regional wall motion. Definity contrast agent was given IV to delineate the left ventricular endocardial borders. The left ventricular internal cavity size was normal in size. There is no left ventricular hypertrophy. Left ventricular  diastolic parameters are indeterminate. Right Ventricle: The right ventricular size is normal. No increase in right ventricular wall thickness. Right ventricular systolic function is normal. Left Atrium: Left atrial size was mildly dilated. Right Atrium: Right atrial size was normal in size. Pericardium: There is no evidence of pericardial effusion. Mitral Valve: The mitral valve is normal in structure. Moderate mitral valve regurgitation. No evidence of mitral valve stenosis. Tricuspid Valve: The tricuspid valve is normal in structure. Tricuspid valve regurgitation is trivial. No evidence of tricuspid stenosis. Aortic Valve: The aortic valve is normal in structure. Aortic valve regurgitation is mild. No aortic stenosis is present. Pulmonic Valve: The pulmonic valve was normal in structure. Pulmonic valve regurgitation is not visualized. No evidence of pulmonic stenosis. Aorta: Aortic dilatation noted. There is mild dilatation of the ascending aorta, measuring 42 mm. Venous: The inferior vena cava was not well visualized. IAS/Shunts: No atrial level shunt detected by color flow Doppler.  LEFT VENTRICLE PLAX 2D LVIDd:         5.20 cm LVIDs:         3.70 cm LV PW:         1.00 cm LV IVS:        0.90 cm LVOT diam:     2.50 cm LV SV:         67 LV SV Index:   30 LVOT Area:     4.91 cm  RIGHT VENTRICLE RV Basal diam:  3.00 cm RV Mid diam:    3.10 cm RV S prime:     11.20 cm/s TAPSE (M-mode): 1.6 cm LEFT ATRIUM             Index        RIGHT ATRIUM           Index LA diam:        4.20 cm 1.92 cm/m   RA Area:  10.70 cm LA Vol (A2C):   60.1 ml 27.46 ml/m  RA Volume:   20.40 ml  9.32 ml/m LA Vol (A4C):   48.3 ml 22.07 ml/m LA Biplane Vol: 57.5 ml 26.27 ml/m  AORTIC VALVE LVOT Vmax:   70.35 cm/s LVOT Vmean:  44.950 cm/s LVOT VTI:    0.136 m  AORTA Ao Root diam: 3.80 cm MR Peak grad: 102.8 mmHg  TRICUSPID VALVE MR Vmax:      507.00 cm/s TR Peak grad:   18.8 mmHg                           TR Vmax:        217.00 cm/s                             SHUNTS                           Systemic VTI:  0.14 m                           Systemic Diam: 2.50 cm Kathlyn Sacramento MD Electronically signed by Kathlyn Sacramento MD Signature Date/Time: 08/20/2022/12:45:25 PM    Final     Disposition Plan & Communication  Patient status: Inpatient  Admitted From: Home Planned disposition location: TBD. SNF vs [facility] Anticipated discharge date: 11/11 pending TEE and antibiotic plan  Family Communication: none    Author: Richarda Osmond, DO [facility] 08/22/2022, 8:11 AM   Available by Epic secure chat 7AM-7PM. If 7PM-7AM, please contact night-coverage.  [facility] contact information found on [email or web address].

## 2022-08-22 NOTE — Progress Notes (Signed)
Physical Therapy Treatment Patient Details Name: Nicholas [name] MRN: [id number] DOB: 21-Sep-1951 Today's Date: 08/22/2022   History of Present Illness Nicholas [name] is a 44, past medical history CAD/MI, GERD, PFO, remote history of seizure, questionable history of stroke/CHF.  Presented to the 08/16/2022 from home via EMS for fall,  found to be COVID +    PT Comments    Patient reports he is doing well, agrees to PT session. Patient will be here another 4 days until he can have TEE and determine needs. Patient is independent with bed mobility, transfers with supervision. Ambulated 50 feet in room with RW and supervision. Patient with slowed cadence. He is progressing well and discharge disposition updated to HHPT. Patient will continue to benefit from skilled PT to improve functional independence and safety.       Recommendations for follow up therapy are one component of a multi-disciplinary discharge planning process, led by the attending physician.  Recommendations may be updated based on patient status, additional functional criteria and insurance authorization.  Follow Up Recommendations  Home health PT Can patient physically be transported by private vehicle: Yes   Assistance Recommended at Discharge Intermittent Supervision/Assistance  Patient can return home with the following A little help with walking and/or transfers;A little help with bathing/dressing/bathroom;Assistance with cooking/housework;Assist for transportation;Help with stairs or ramp for entrance   Equipment Recommendations  None recommended by PT    Recommendations for Other Services       Precautions / Restrictions Precautions Precautions: Fall Restrictions Weight Bearing Restrictions: No     Mobility  Bed Mobility Overal bed mobility: Independent Bed Mobility: Supine to Sit     Supine to sit: Independent          Transfers Overall transfer level: Modified independent Equipment used: Rolling  walker (2 wheels) Transfers: Sit to/from Stand Sit to Stand: Supervision                Ambulation/Gait Ambulation/Gait assistance: Supervision Gait Distance (Feet): 50 Feet Assistive device: Rolling walker (2 wheels) Gait Pattern/deviations: Step-through pattern, Decreased step length - right, Decreased step length - left, Decreased stride length Gait velocity: decr     General Gait Details: patient ambulated with supervision/min guard, without dizziness this session.   Stairs             Wheelchair Mobility    Modified Rankin (Stroke Patients Only)       Balance Overall balance assessment: Modified Independent Sitting-balance support: Feet supported Sitting balance-Leahy Scale: Normal     Standing balance support: Bilateral upper extremity supported, During functional activity, Reliant on assistive device for balance Standing balance-Leahy Scale: Fair Standing balance comment: reliant on RW at thsi time, was not using prior to admission                            Cognition Arousal/Alertness: Awake/alert Behavior During Therapy: [facility] for tasks assessed/performed Overall Cognitive Status: Within Functional Limits for tasks assessed                                          Exercises      General Comments        Pertinent Vitals/Pain Pain Assessment Pain Assessment: No/denies pain    Home Living  Prior Function            PT Goals (current goals can now be found in the care plan section) Acute Rehab PT Goals Patient Stated Goal: feel better, go home PT Goal Formulation: With patient Time For Goal Achievement: 09/02/22 Potential to Achieve Goals: Good Progress towards PT goals: Progressing toward goals    Frequency    Min 2X/week      PT Plan Discharge plan needs to be updated    Co-evaluation              AM-PAC PT "6 Clicks" Mobility   Outcome Measure  Help  needed turning from your back to your side while in a flat bed without using bedrails?: None Help needed moving from lying on your back to sitting on the side of a flat bed without using bedrails?: None Help needed moving to and from a bed to a chair (including a wheelchair)?: A Little Help needed standing up from a chair using your arms (e.g., wheelchair or bedside chair)?: A Little Help needed to walk in hospital room?: A Little Help needed climbing 3-5 steps with a railing? : A Little 6 Click Score: 20    End of Session Equipment Utilized During Treatment: Gait belt Activity Tolerance: Patient tolerated treatment well Patient left: in chair;with call bell/phone within reach;with chair alarm set Nurse Communication: Mobility status PT Visit Diagnosis: Muscle weakness (generalized) (M62.81);Difficulty in walking, not elsewhere classified (R26.2)     Time: 0865-7846 PT Time Calculation (min) (ACUTE ONLY): 17 min  Charges:  $Gait Training: 8-22 mins                     Nakeesha Bowler, PT, GCS 08/22/22,11:57 AM

## 2022-08-22 NOTE — Progress Notes (Signed)
Rounding Note    Patient Name: Nicholas [name] Date of Encounter: 08/22/2022  [place] Cardiologist: None   Subjective   No acute events overnight, denies palpitations, breathing is okay.  No bleeding issues with Eliquis.  Inpatient Medications    Scheduled Meds:  apixaban  5 mg Oral BID   vitamin C  500 mg Oral Daily   B-complex with vitamin C  1 tablet Oral Daily   Chlorhexidine Gluconate Cloth  6 each Topical Daily   cholecalciferol  1,000 Units Oral Daily   divalproex  500 mg Oral Daily   ferrous sulfate  325 mg Oral Q breakfast   fluticasone  2 spray Each Nare Daily   furosemide  20 mg Oral q morning   gabapentin  300 mg Oral Q breakfast   And   gabapentin  600 mg Oral BID   influenza vaccine adjuvanted  0.5 mL Intramuscular Tomorrow-1000   levothyroxine  112 mcg Oral Q0600   loratadine  10 mg Oral Daily   metoprolol succinate  100 mg Oral Daily   mupirocin ointment  1 Application Nasal BID   pantoprazole  40 mg Oral QODAY   tamsulosin  0.4 mg Oral Daily   zinc sulfate  220 mg Oral Daily   Continuous Infusions:   ceFAZolin (ANCEF) IV 2 g (08/22/22 0614)   PRN Meds: chlorpheniramine-HYDROcodone, EPINEPHrine, guaiFENesin-dextromethorphan, magnesium hydroxide, methocarbamol, nitroGLYCERIN, ondansetron **OR** ondansetron (ZOFRAN) IV, promethazine, tiZANidine, traZODone   Vital Signs    Vitals:   08/22/22 0057 08/22/22 0610 08/22/22 0934 08/22/22 1253  BP: 129/80 (!) 94/58 124/76 (!) 97/52  Pulse: (!) 53 74 80 69  Resp: '18 18 18 18  '$ Temp: 97.6 F (36.4 C) 97.7 F (36.5 C) 98.4 F (36.9 C) 97.6 F (36.4 C)  TempSrc: Oral Oral Oral Oral  SpO2: 97% 97% 98% 97%  Weight:      Height:        Intake/Output Summary (Last 24 hours) at 08/22/2022 1337 Last data filed at 08/22/2022 0934 Gross per 24 hour  Intake 600 ml  Output 825 ml  Net -225 ml      08/21/2022    6:20 AM 08/17/2022    8:32 AM 08/12/2022    6:14 AM  Last 3 Weights  Weight  (lbs) 249 lb 9.6 oz 235 lb 235 lb  Weight (kg) 113.218 kg 106.595 kg 106.595 kg      Telemetry    Atrial fibrillation, heart rate 86- Personally Reviewed  ECG     - Personally Reviewed  Physical Exam   GEN: No acute distress.   Neck: No JVD Cardiac: Irregular irregular Respiratory: Decreased breath sounds GI: Soft, nontender, non-distended  MS: No edema; No deformity. Neuro:  Nonfocal  Psych: Normal affect   Labs    High Sensitivity Troponin:   Recent Labs  Lab 08/12/22 0623 08/12/22 1058 08/16/22 2202 08/17/22 0105 08/17/22 0224  TROPONINIHS 8 8 56* 48* 44*     Chemistry Recent Labs  Lab 08/19/22 0616 08/20/22 0542 08/21/22 0804 08/22/22 0731  NA 137 137 136 136  K 4.0 3.9 4.0 4.9  CL 103 105 104 104  CO2 '26 26 28 26  '$ GLUCOSE 178* 156* 132* 105*  BUN 27* 37* 35* 30*  CREATININE 0.90 0.97 0.91 1.00  CALCIUM 7.8* 7.8* 7.7* 7.9*  MG 2.2  --   --   --   PROT 5.7* 5.6* 5.4* 5.7*  ALBUMIN 2.6* 2.5* 2.5* 2.6*  AST 29  37 49* 42*  ALT 41 54* 69* 69*  ALKPHOS 47 46 45 50  BILITOT 1.0 0.9 0.9 1.1  GFRNONAA >60 >60 >60 >60  ANIONGAP 8 6 4* 6    Lipids No results for input(s): "CHOL", "TRIG", "HDL", "LABVLDL", "LDLCALC", "CHOLHDL" in the last 168 hours.  Hematology Recent Labs  Lab 08/20/22 0542 08/21/22 0804 08/22/22 0731  WBC 10.6* 14.7* 12.7*  RBC 4.29 4.49 4.75  HGB 13.6 14.0 15.1  HCT 39.4 41.1 44.8  MCV 91.8 91.5 94.3  MCH 31.7 31.2 31.8  MCHC 34.5 34.1 33.7  RDW 12.8 12.9 13.0  PLT 214 207 134*   Thyroid No results for input(s): "TSH", "FREET4" in the last 168 hours.  BNPNo results for input(s): "BNP", "PROBNP" in the last 168 hours.  DDimer No results for input(s): "DDIMER" in the last 168 hours.   Radiology    No results found.  Cardiac Studies   TTE 08/20/2022 1. Left ventricular ejection fraction, by estimation, is 55 to 60%. The  left ventricle has normal function. Left ventricular endocardial border  not optimally defined to  evaluate regional wall motion. Left ventricular  diastolic parameters are  indeterminate.   2. Right ventricular systolic function is normal. The right ventricular  size is normal.   3. Left atrial size was mildly dilated.   4. The mitral valve is normal in structure. Moderate mitral valve  regurgitation. No evidence of mitral stenosis.   5. The aortic valve is normal in structure. Aortic valve regurgitation is  mild. No aortic stenosis is present.   6. Aortic dilatation noted. There is mild dilatation of the ascending  aorta, measuring 42 mm.   Patient Profile     71 y.o. male with history of CVA presenting with fall, diagnosed with hypertension and MSSA bacteremia.  Being seen for A-fib with rapid ventricular response.  Assessment & Plan    A-fib RVR -Heart rate better controlled -Toprol-XL 100 mg daily, Eliquis 5 mg twice daily -Plan TEE guided DC cardioversion in 2 days  2.  MSSA bacteremia -TEE requested by ID team, recent COVID-19 infection -Per protocol, special procedures available as soon as Friday.  We will plan TEE DC cardioversion in 2 days. -New orders as per ID team   Total encounter time more than 50 minutes  Greater than 50% was spent in counseling and coordination of care with the patient     Signed, Kate Sable, MD  08/22/2022, 1:37 PM

## 2022-08-22 NOTE — Progress Notes (Signed)
Brief ID follow up note:   No events noted.  Today is 10 days from Covid dx.  Cardiology would prefer to wait 14 days for TEE per their preference with doing procedure from prior notes.  Would prefer to get TEE done while inpatient as this information will be a key component of decision tree and whether to treat for 2-4 vs 6 weeks of antibiotics.  More importantly, it will also guide whether one would feel comfortable discharging home on oral therapy vs placing PICC line and home health orders.  Await TEE.   Raynelle Highland for Infectious Disease [facility] 08/22/2022, 8:08 AM

## 2022-08-22 NOTE — TOC Progression Note (Signed)
Transition of Care [facility]) - Progression Note    Patient Details  Name: Nicholas [name] MRN: [id number] Date of Birth: 1950-12-01  Transition of Care [facility]) CM/SW [place], [place] Phone Number: 08/22/2022, 11:28 AM  Clinical Narrative:     Patient has bed at [facility] (patient preference). Insurance authorization has been started with [employer].  Pending auth at this time.   Expected Discharge Plan: Skilled Nursing Facility Barriers to Discharge: Continued Medical Work up  Expected Discharge Plan and Services Expected Discharge Plan: [place]                                               Social Determinants of Health (SDOH) Interventions    Readmission Risk Interventions     No data to display

## 2022-08-23 DIAGNOSIS — R531 Weakness: Secondary | ICD-10-CM | POA: Diagnosis not present

## 2022-08-23 DIAGNOSIS — W19XXXA Unspecified fall, initial encounter: Secondary | ICD-10-CM | POA: Diagnosis not present

## 2022-08-23 DIAGNOSIS — A419 Sepsis, unspecified organism: Secondary | ICD-10-CM

## 2022-08-23 DIAGNOSIS — U071 COVID-19: Secondary | ICD-10-CM | POA: Diagnosis not present

## 2022-08-23 DIAGNOSIS — R7881 Bacteremia: Secondary | ICD-10-CM | POA: Diagnosis not present

## 2022-08-23 LAB — COMPREHENSIVE METABOLIC PANEL
ALT: 56 U/L — ABNORMAL HIGH (ref 0–44)
AST: 36 U/L (ref 15–41)
Albumin: 2.3 g/dL — ABNORMAL LOW (ref 3.5–5.0)
Alkaline Phosphatase: 49 U/L (ref 38–126)
Anion gap: 3 — ABNORMAL LOW (ref 5–15)
BUN: 28 mg/dL — ABNORMAL HIGH (ref 8–23)
CO2: 31 mmol/L (ref 22–32)
Calcium: 7.5 mg/dL — ABNORMAL LOW (ref 8.9–10.3)
Chloride: 104 mmol/L (ref 98–111)
Creatinine, Ser: 1.02 mg/dL (ref 0.61–1.24)
GFR, Estimated: >60 mL/min (ref >60)
Glucose, Bld: 102 mg/dL — ABNORMAL HIGH (ref 70–99)
Potassium: 5.2 mmol/L — ABNORMAL HIGH (ref 3.5–5.1)
Sodium: 138 mmol/L (ref 135–145)
Total Bilirubin: 0.9 mg/dL (ref 0.3–1.2)
Total Protein: 5.2 g/dL — ABNORMAL LOW (ref 6.5–8.1)

## 2022-08-23 LAB — PROTIME-INR
INR: 1.2 (ref 0.8–1.2)
Prothrombin Time: 15.3 seconds — ABNORMAL HIGH (ref 11.4–15.2)

## 2022-08-23 MED ORDER — SODIUM CHLORIDE 0.9 % IV SOLN: INTRAVENOUS | Status: DC

## 2022-08-23 NOTE — Care Management Important Message (Signed)
Important Message  Patient Details  Name: Nicholas [name] MRN: [id number] Date of Birth: [birth year]   Medicare Important Message Given:  Yes     Dannette Barbara 08/23/2022, 2:32 PM

## 2022-08-23 NOTE — Progress Notes (Signed)
PROGRESS NOTE  Nicholas [name]    DOB: [birth year], 71 y.o.  WPY:[id number]    Code Status: Full Code   DOA: 08/16/2022   LOS: 5   Brief hospital course  Nicholas [name] is a 71 y.o. male with a PMH significant for CHF, hypothyroidism, HLD, seizure disorder, Afib RVR, covid-19, Type 2 DM.  They presented from home to the ED on 08/16/2022 with generalized weakness and falls x a couple days. Also endorsed cough. Diagnosed with COVID-19 4 days prior to presentation and was taking paxlovid to complete on 11/6.  In the ED, it was found that they had hypotensive with systolic BP in the 05L and his pulse oximetry was in the 80s.  He was also noted to be tachycardic with heart rate of 140. Labs revealed borderline potassium of 3.5 and a BUN of 27 with albumin 3.3, AST 65 and ALT 48.  Total CK was 450 and high sensitive troponin I was 56.  CBC showed leukocytosis of 13 with neutrophilia.  Blood cultures were drawn.  UA was negative.  Significant findings included EKG showed atrial fibrillation with rapid ventricular sponsor 117.Portable chest ray showed cardiomegaly with mild central congestion with improved overall appearance compared to 08/12/2022 with patchy atelectasis at the left base.   They were initially treated with 4 mg of IV Zofran and 1 L bolus of IV normal saline. Cardiology was consulted.   Patient was admitted to medicine service for further workup and management of acute hypoxic respiratory failure as outlined in detail below.  08/23/22 -feeling improved. Wanting to be discharged as soon as possible.  Assessment & Plan  Principal Problem:   MSSA bacteremia Active Problems:   Chronic diastolic CHF (congestive heart failure) (HCC)   Hypothyroidism   Dyslipidemia   Weakness   Seizure disorder (HCC)   Atrial fibrillation with rapid ventricular response ([place])   Fall   COVID-19   Bacteremia   Paroxysmal atrial fibrillation (HCC)   Type 2 diabetes mellitus with foot ulcer, without  long-term current use of insulin (HCC)   Persistent atrial fibrillation (HCC)   Atrial fibrillation with controlled ventricular rate ([place])  COVID-19 w/ acute hypoxic respiratory failure and pneumonia. Day 10 since diagnosis. Currently ORA - discontinue PPE precautions, per hospital policy Wean to room air as tolerated Completed Paxlovid Completed Steroids  PT/OT for physical deconditioning. Recommending HH   Atrial Fibrillation w/ RVR  HFpEF (EF 55-60%)  hypotension (resolved)- CHADS2VASc 3-4 Cardiology following, appreciate care TEE planned with cardioversion 11/10 Continue eliquis Rate control w/ metoprolol   Elevated high sensitivity troponin: Likely demand ischemia in the setting of recent Covid infection and Afib with RVR LHC in 2019 showed no significant CAD Cardiology following    MSSA Bacteremia- with unknown source. Confirmed on 2 BxCxs. TEE planned to guide antibiotic course length decision continue Cefazolin IV 08/18/22- ID following, appreciate your care Possible PICC for continued IV vs PO on dc  Migraine 11/05 - resolved Continue depakote ppx NSAID allergy - can't have this HTN - can't safely given triptan until BP improved Ordered phenergan Consider Fioricet     Hypothyroidism Synthroid.   Seizure disorder (HCC) Hx migraine  Depakote DR.   Dyslipidemia hold off statin therapy given elevated CK level which could be related to mild rhabdomyolysis vs COVID inflammation  Body mass index is 37.95 kg/m.  VTE ppx:  apixaban (ELIQUIS) tablet 5 mg   Diet:     Diet   Diet Heart Room service appropriate?  Yes; Fluid consistency: Thin   Consultants: Cardiology ID Subjective 08/23/22    Pt reports feeling overall well. Is anxiously anticipating discharge as soon as possible. All questions and concerns addressed at time of encounter.    Objective   Vitals:   08/22/22 1253 08/22/22 2026 08/22/22 2319 08/23/22 0344  BP: (!) 97/52 101/66 104/63 103/67   Pulse: 69 85  64  Resp: '18 18  17  '$ Temp: 97.6 F (36.4 C) 98 F (36.7 C) 98.3 F (36.8 C) 98.8 F (37.1 C)  TempSrc: Oral Oral Oral   SpO2: 97% 95% 96% 98%  Weight:      Height:        Intake/Output Summary (Last 24 hours) at 08/23/2022 0748 Last data filed at 08/22/2022 2300 Gross per 24 hour  Intake 240 ml  Output 1175 ml  Net -935 ml    Filed Weights   08/17/22 0832 08/21/22 0620  Weight: 106.6 kg 113.2 kg     Physical Exam:  General: awake, alert, NAD HEENT: atraumatic, clear conjunctiva, anicteric sclera, MMM, hearing grossly normal Respiratory: normal respiratory effort. Cardiovascular: quick capillary refill, normal S1/S2, RRR, no JVD, murmurs Nervous: A&O x3. no gross focal neurologic deficits, normal speech Extremities: moves all equally, no edema, normal tone Skin: dry, intact, normal temperature, normal color. No rashes, lesions or ulcers on exposed skin Psychiatry: normal mood, congruent affect  Labs   I have personally reviewed the following labs and imaging studies CBC    Component Value Date/Time   WBC 12.7 (H) 08/22/2022 0731   RBC 4.75 08/22/2022 0731   HGB 15.1 08/22/2022 0731   HGB 15.8 01/16/2022 1415   HCT 44.8 08/22/2022 0731   HCT 45.5 01/16/2022 1415   PLT 134 (L) 08/22/2022 0731   PLT 213 01/16/2022 1415   MCV 94.3 08/22/2022 0731   MCV 92 01/16/2022 1415   MCH 31.8 08/22/2022 0731   MCHC 33.7 08/22/2022 0731   RDW 13.0 08/22/2022 0731   RDW 12.6 01/16/2022 1415   LYMPHSABS 0.6 (L) 08/20/2022 0542   LYMPHSABS 1.6 01/16/2022 1415   MONOABS 1.1 (H) 08/20/2022 0542   EOSABS 0.0 08/20/2022 0542   EOSABS 0.3 01/16/2022 1415   BASOSABS 0.0 08/20/2022 0542   BASOSABS 0.1 01/16/2022 1415      Latest Ref Rng & Units 08/23/2022    5:46 AM 08/22/2022    7:31 AM 08/21/2022    8:04 AM  BMP  Glucose 70 - 99 mg/dL 102  105  132   BUN 8 - 23 mg/dL 28  30  35   Creatinine 0.61 - 1.24 mg/dL 1.02  1.00  0.91   Sodium 135 - 145 mmol/L 138  136   136   Potassium 3.5 - 5.1 mmol/L 5.2  4.9  4.0   Chloride 98 - 111 mmol/L 104  104  104   CO2 22 - 32 mmol/L '31  26  28   '$ Calcium 8.9 - 10.3 mg/dL 7.5  7.9  7.7     No results found.  Disposition Plan & Communication  Patient status: Inpatient  Admitted From: Home Planned disposition location: [facility] Anticipated discharge date: 11/11 pending TEE and antibiotic plan  Family Communication: none    Author: Richarda Osmond, DO [facility] 08/23/2022, 7:48 AM   Available by Epic secure chat 7AM-7PM. If 7PM-7AM, please contact night-coverage.  [facility] contact information found on [email or web address].

## 2022-08-23 NOTE — Anesthesia Preprocedure Evaluation (Addendum)
Anesthesia Evaluation  Patient identified by MRN, date of birth, ID band Patient awake    Reviewed: Allergy & Precautions, NPO status , Patient's Chart, lab work & pertinent test results  History of Anesthesia Complications Negative for: history of anesthetic complications  Airway Mallampati: II  TM Distance: >3 FB Neck ROM: Full    Dental  (+) Edentulous Upper, Edentulous Lower   Pulmonary asthma , sleep apnea  COVID-19 w/ acute hypoxic respiratory failure and pneumonia now on RA   Pulmonary exam normal breath sounds clear to auscultation       Cardiovascular + Past MI (NSTEMI 2009 in setting of sepsis) and +CHF (HFpEF)   Rhythm:Irregular Rate:Normal  TTE 2019: EF 55%, mild AR, MR, TR   Neuro/Psych  Headaches, Seizures - (last seizure in 20s), Well Controlled,   Anxiety Depression    CVA (2010), No Residual Symptoms    GI/Hepatic Neg liver ROS, hiatal hernia,GERD  Controlled,,  Endo/Other  Hypothyroidism    Renal/GU Renal InsufficiencyRenal disease     Musculoskeletal  (+) Arthritis ,    Abdominal  (+) + obese  Peds  Hematology negative hematology ROS (+)   Anesthesia Other Findings Per cardiology note: Atrial fibrillation with RVR Improved rate control with metoprolol succinate 100 daily Plan for TEE and cardioversion tomorrow morning Risk and benefit of procedure discussed with him in detail   MSSA bacteremia/COVID Transesophageal echo scheduled for tomorrow morning On antibiotic per ID COVID infection more than 10 days ago, minimal cough, on room air   Elevated troponin 56, likely supply/demand mismatch Prior left heart catheterization 2019 no significant coronary disease Plan for ischemic work-up this admission   Reproductive/Obstetrics                             Anesthesia Physical Anesthesia Plan  ASA: 3  Anesthesia Plan: General   Post-op Pain Management: Minimal or  no pain anticipated   Induction: Intravenous  PONV Risk Score and Plan: 2 and TIVA and Propofol infusion  Airway Management Planned: Natural Airway and Nasal Cannula  Additional Equipment:   Intra-op Plan:   Post-operative Plan:   Informed Consent: I have reviewed the patients History and Physical, chart, labs and discussed the procedure including the risks, benefits and alternatives for the proposed anesthesia with the patient or authorized representative who has indicated his/her understanding and acceptance.     Dental advisory given  Plan Discussed with: CRNA  Anesthesia Plan Comments:         Anesthesia Quick Evaluation

## 2022-08-23 NOTE — Progress Notes (Signed)
Rounding Note    Patient Name: Nicholas [name] Date of Encounter: 08/23/2022  [place] Cardiologist: None   Subjective   Patient remains in rate controlled afib. Plan for TEE/DCCV tomorrow. He is feeling about the same. No chest pain or SOB. He is still coughing.   Inpatient Medications    Scheduled Meds:  apixaban  5 mg Oral BID   vitamin C  500 mg Oral Daily   B-complex with vitamin C  1 tablet Oral Daily   Chlorhexidine Gluconate Cloth  6 each Topical Daily   cholecalciferol  1,000 Units Oral Daily   divalproex  500 mg Oral Daily   ferrous sulfate  325 mg Oral Q breakfast   fluticasone  2 spray Each Nare Daily   furosemide  20 mg Oral q morning   gabapentin  300 mg Oral Q breakfast   And   gabapentin  600 mg Oral BID   influenza vaccine adjuvanted  0.5 mL Intramuscular Tomorrow-1000   levothyroxine  112 mcg Oral Q0600   loratadine  10 mg Oral Daily   metoprolol succinate  100 mg Oral Daily   mupirocin ointment  1 Application Nasal BID   pantoprazole  40 mg Oral QODAY   tamsulosin  0.4 mg Oral Daily   zinc sulfate  220 mg Oral Daily   Continuous Infusions:  sodium chloride      ceFAZolin (ANCEF) IV 2 g (08/23/22 0524)   PRN Meds: chlorpheniramine-HYDROcodone, EPINEPHrine, guaiFENesin-dextromethorphan, magnesium hydroxide, methocarbamol, nitroGLYCERIN, ondansetron **OR** ondansetron (ZOFRAN) IV, promethazine, tiZANidine, traZODone   Vital Signs    Vitals:   08/22/22 2026 08/22/22 2319 08/23/22 0344 08/23/22 0808  BP: 101/66 104/63 103/67 107/66  Pulse: 85  64 (!) 50  Resp: '18  17 20  '$ Temp: 98 F (36.7 C) 98.3 F (36.8 C) 98.8 F (37.1 C) 97.7 F (36.5 C)  TempSrc: Oral Oral  Oral  SpO2: 95% 96% 98% 100%  Weight:      Height:        Intake/Output Summary (Last 24 hours) at 08/23/2022 0834 Last data filed at 08/23/2022 0811 Gross per 24 hour  Intake 240 ml  Output 1450 ml  Net -1210 ml      08/21/2022    6:20 AM 08/17/2022    8:32 AM  08/12/2022    6:14 AM  Last 3 Weights  Weight (lbs) 249 lb 9.6 oz 235 lb 235 lb  Weight (kg) 113.218 kg 106.595 kg 106.595 kg      Telemetry    Afib PVCs,HR 70-80s - Personally Reviewed  ECG    No new - Personally Reviewed  Physical Exam   GEN: No acute distress.   Neck: No JVD Cardiac: RRR, no murmurs, rubs, or gallops.  Respiratory: wheezing GI: Soft, nontender, non-distended  MS: No edema; No deformity. Neuro:  Nonfocal  Psych: Normal affect   Labs    High Sensitivity Troponin:   Recent Labs  Lab 08/12/22 0623 08/12/22 1058 08/16/22 2202 08/17/22 0105 08/17/22 0224  TROPONINIHS 8 8 56* 48* 44*     Chemistry Recent Labs  Lab 08/19/22 0616 08/20/22 0542 08/21/22 0804 08/22/22 0731 08/23/22 0546  NA 137   < > 136 136 138  K 4.0   < > 4.0 4.9 5.2*  CL 103   < > 104 104 104  CO2 26   < > '28 26 31  '$ GLUCOSE 178*   < > 132* 105* 102*  BUN 27*   < >  35* 30* 28*  CREATININE 0.90   < > 0.91 1.00 1.02  CALCIUM 7.8*   < > 7.7* 7.9* 7.5*  MG 2.2  --   --   --   --   PROT 5.7*   < > 5.4* 5.7* 5.2*  ALBUMIN 2.6*   < > 2.5* 2.6* 2.3*  AST 29   < > 49* 42* 36  ALT 41   < > 69* 69* 56*  ALKPHOS 47   < > 45 50 49  BILITOT 1.0   < > 0.9 1.1 0.9  GFRNONAA >60   < > >60 >60 >60  ANIONGAP 8   < > 4* 6 3*   < > = values in this interval not displayed.    Lipids No results for input(s): "CHOL", "TRIG", "HDL", "LABVLDL", "LDLCALC", "CHOLHDL" in the last 168 hours.  Hematology Recent Labs  Lab 08/20/22 0542 08/21/22 0804 08/22/22 0731  WBC 10.6* 14.7* 12.7*  RBC 4.29 4.49 4.75  HGB 13.6 14.0 15.1  HCT 39.4 41.1 44.8  MCV 91.8 91.5 94.3  MCH 31.7 31.2 31.8  MCHC 34.5 34.1 33.7  RDW 12.8 12.9 13.0  PLT 214 207 134*   Thyroid No results for input(s): "TSH", "FREET4" in the last 168 hours.  BNPNo results for input(s): "BNP", "PROBNP" in the last 168 hours.  DDimer No results for input(s): "DDIMER" in the last 168 hours.   Radiology    No results  found.  Cardiac Studies   TTE 08/20/2022 1. Left ventricular ejection fraction, by estimation, is 55 to 60%. The  left ventricle has normal function. Left ventricular endocardial border  not optimally defined to evaluate regional wall motion. Left ventricular  diastolic parameters are  indeterminate.   2. Right ventricular systolic function is normal. The right ventricular  size is normal.   3. Left atrial size was mildly dilated.   4. The mitral valve is normal in structure. Moderate mitral valve  regurgitation. No evidence of mitral stenosis.   5. The aortic valve is normal in structure. Aortic valve regurgitation is  mild. No aortic stenosis is present.   6. Aortic dilatation noted. There is mild dilatation of the ascending  aorta, measuring 42 mm.   Patient Profile     71 y.o. male with history of CVA presenting with fall, diagnosed with hypertension and MSSA bacteremia.  Being seen for A-fib with rapid ventricular response.   Assessment & Plan    Afib RVR - Heart rate controlled in afib - continue Eliquis '5mg'$  BID - Toprol XL '100mg'$  daily - plan for TEE/DCCV  on 11/10  MSSA bacetermia/COVID - TEE requested by ID team - he is >10 days COVID infection - plan for TEE on 11/10  Elevated troponin - no chest pain reported - HS troponin elevated to 56, not consistent with ACS - LHC in 2019 showed no significant CAD - Noninvasive testing can he as outpatient  For questions or updates, please contact [place] Please consult [email or web address] for contact info under        Signed, Lakindra Wible Ninfa Meeker, PA-C  08/23/2022, 8:34 AM

## 2022-08-23 NOTE — Plan of Care (Signed)

## 2022-08-23 NOTE — Progress Notes (Signed)
Nicholas [name] for Infectious Disease  Date of Admission:  08/16/2022           Reason for visit: Follow up on MSSA bacteremia  Current antibiotics: Cefazolin 11/3-present  ASSESSMENT:    71 y.o. male admitted with:  # MSSA bacteremia # Recent COVID infection # Left first metatarsal head ulcer # Prior amputation left great and 2nd toe   - COVID PCR positive 10/29.  Now off precautions.  On room air - blood cx positive 11/2 - repeat cx NGTD from 11/5 - unclear source.  No blood cx drawn during [facility] admission 10/29-11/1 - on room air, chest imaging not convincing for bacterial pneumonia - possible related to prior IV access attempts with multiple bruises on arms and patient report of being a difficult IV stick - TTE negative, TEE tomorrow - Left first met ulcer (followed by Dr [name] outpatient) does not appear infected or a likely source  RECOMMENDATIONS:    Continue cefazolin TEE tomorrow If negative for vegetation, would feel comfortable with short IV antibiotic course with early transition to PO therapy to complete 4 weeks of antibiotics total Plan if TEE negative to have midline placed (not PICC) to finish cefazolin x 14 days from negative cultures (going to SNF) through 09/01/22 then transition to Linezolid 662m BID PO on 09/02/22 Finish antibiotics on 09/16/22 Will follow up tomorrow   Principal Problem:   MSSA bacteremia Active Problems:   Hypothyroidism   Chronic diastolic CHF (congestive heart failure) (HCC)   Dyslipidemia   Weakness   Seizure disorder (HCC)   Atrial fibrillation with RVR ([place]   Fall   COVID-19   Bacteremia   Paroxysmal atrial fibrillation ([place]   Type 2 diabetes mellitus with foot ulcer, without long-term current use of insulin (HCC)   Persistent atrial fibrillation (HCC)   Atrial fibrillation with controlled ventricular rate (HCC)   Sepsis ([place]    MEDICATIONS:    Scheduled Meds:  apixaban  5 mg Oral BID   vitamin C  500  mg Oral Daily   B-complex with vitamin C  1 tablet Oral Daily   Chlorhexidine Gluconate Cloth  6 each Topical Daily   cholecalciferol  1,000 Units Oral Daily   divalproex  500 mg Oral Daily   ferrous sulfate  325 mg Oral Q breakfast   fluticasone  2 spray Each Nare Daily   furosemide  20 mg Oral q morning   gabapentin  300 mg Oral Q breakfast   And   gabapentin  600 mg Oral BID   influenza vaccine adjuvanted  0.5 mL Intramuscular Tomorrow-1000   levothyroxine  112 mcg Oral Q0600   loratadine  10 mg Oral Daily   metoprolol succinate  100 mg Oral Daily   mupirocin ointment  1 Application Nasal BID   pantoprazole  40 mg Oral QODAY   tamsulosin  0.4 mg Oral Daily   zinc sulfate  220 mg Oral Daily   Continuous Infusions:  sodium chloride      ceFAZolin (ANCEF) IV 2 g (08/23/22 0524)   PRN Meds:.chlorpheniramine-HYDROcodone, EPINEPHrine, guaiFENesin-dextromethorphan, magnesium hydroxide, methocarbamol, nitroGLYCERIN, ondansetron **OR** ondansetron (ZOFRAN) IV, promethazine, tiZANidine, traZODone  SUBJECTIVE:   24 hour events:  No acute events Blood cx remain NGTD On cefazolin TEE planned tomorrow Anxious to go home Feeling well, but tired.  Review of Systems  All other systems reviewed and are negative.     OBJECTIVE:   Blood pressure (!) 86/53, pulse 87, temperature  97.9 F (36.6 C), temperature source Oral, resp. rate 18, height _0  (1.727 m), weight 113.2 kg, SpO2 97 %. Body mass index is 37.95 kg/m.  Physical Exam Constitutional:      General: He is not in acute distress.    Appearance: Normal appearance.  HENT:     Head: Normocephalic and atraumatic.  Eyes:     Extraocular Movements: Extraocular movements intact.     Conjunctiva/sclera: Conjunctivae normal.  Pulmonary:     Effort: Pulmonary effort is normal. No respiratory distress.  Abdominal:     General: There is no distension.     Palpations: Abdomen is soft.  Musculoskeletal:        General: Normal  range of motion.     Cervical back: Normal range of motion and neck supple.  Skin:    General: Skin is warm and dry.     Findings: No rash.  Neurological:     General: No focal deficit present.     Mental Status: He is alert and oriented to person, place, and time.  Psychiatric:        Mood and Affect: Mood normal.        Behavior: Behavior normal.      Lab Results: Lab Results  Component Value Date   WBC 12.7 (H) 08/22/2022   HGB 15.1 08/22/2022   HCT 44.8 08/22/2022   MCV 94.3 08/22/2022   PLT 134 (L) 08/22/2022    Lab Results  Component Value Date   NA 138 08/23/2022   K 5.2 (H) 08/23/2022   CO2 31 08/23/2022   GLUCOSE 102 (H) 08/23/2022   BUN 28 (H) 08/23/2022   CREATININE 1.02 08/23/2022   CALCIUM 7.5 (L) 08/23/2022   GFRNONAA >60 08/23/2022   GFRAA >60 12/05/2018    Lab Results  Component Value Date   ALT 56 (H) 08/23/2022   AST 36 08/23/2022   ALKPHOS 49 08/23/2022   BILITOT 0.9 08/23/2022       Component Value Date/Time   CRP 0.7 08/21/2022 0804       Component Value Date/Time   ESRSEDRATE 14 06/15/2022 1635     I have reviewed the micro and lab results in Epic.  Imaging: No results found.   Imaging independently reviewed in Epic.    Raynelle Highland for Infectious Disease [facility] [phone] pager 08/23/2022, 12:43 PM   I have personally spent 35 minutes involved in face-to-face and non-face-to-face activities for this patient on the day of the visit. Professional time spent includes the following activities: Preparing to see the patient (review of tests), Obtaining and/or reviewing separately obtained history (admission/discharge record), Performing a medically appropriate examination and/or evaluation , Ordering medications/tests/procedures, referring and communicating with other health care professionals, Documenting clinical information in the EMR, Independently interpreting results (not separately  reported), Communicating results to the patient/family/caregiver, Counseling and educating the patient/family/caregiver and Care coordination (not separately reported).

## 2022-08-23 NOTE — Progress Notes (Signed)
Physical Therapy Treatment Patient Details Name: Nicholas [name] MRN: [id number] DOB: July 18, 1951 Today's Date: 08/23/2022   History of Present Illness Nicholas [name] is a 68, past medical history CAD/MI, GERD, PFO, remote history of seizure, questionable history of stroke/CHF.  Presented to the 08/16/2022 from home via EMS for fall,  found to be COVID +    PT Comments    Patient received in bed, he is agreeable to PT session. Patient is independent with bed mobility. Transfers with supervision. He is able to progress well with gait this session to 150 feet with RW and min guard/supervision. Slow pace. He will continue to benefit from skilled PT to improve strength and safety.      Recommendations for follow up therapy are one component of a multi-disciplinary discharge planning process, led by the attending physician.  Recommendations may be updated based on patient status, additional functional criteria and insurance authorization.  Follow Up Recommendations  Home health PT Can patient physically be transported by private vehicle: Yes   Assistance Recommended at Discharge Intermittent Supervision/Assistance  Patient can return home with the following A little help with walking and/or transfers;A little help with bathing/dressing/bathroom;Assistance with cooking/housework;Assist for transportation;Help with stairs or ramp for entrance   Equipment Recommendations  Rolling walker (2 wheels)    Recommendations for Other Services       Precautions / Restrictions Precautions Precautions: Fall Restrictions Weight Bearing Restrictions: No     Mobility  Bed Mobility Overal bed mobility: Independent Bed Mobility: Supine to Sit, Sit to Supine     Supine to sit: Independent Sit to supine: Independent        Transfers Overall transfer level: Needs assistance Equipment used: Rolling walker (2 wheels) Transfers: Sit to/from Stand Sit to Stand: Supervision                 Ambulation/Gait Ambulation/Gait assistance: Min guard, Supervision Gait Distance (Feet): 150 Feet Assistive device: Rolling walker (2 wheels) Gait Pattern/deviations: Step-through pattern, Decreased step length - right, Decreased step length - left Gait velocity: decr     General Gait Details: patient ambulated with supervision/min guard, without dizziness this session. H/O R knee extension fusion   Stairs             Wheelchair Mobility    Modified Rankin (Stroke Patients Only)       Balance Overall balance assessment: Modified Independent Sitting-balance support: Feet supported Sitting balance-Leahy Scale: Normal     Standing balance support: Bilateral upper extremity supported, During functional activity, Reliant on assistive device for balance Standing balance-Leahy Scale: Good Standing balance comment: reliant on RW at this time, was not using prior to admission                            Cognition Arousal/Alertness: Awake/alert Behavior During Therapy: [facility] for tasks assessed/performed Overall Cognitive Status: Within Functional Limits for tasks assessed                                          Exercises      General Comments        Pertinent Vitals/Pain Pain Assessment Pain Assessment: No/denies pain    Home Living                          Prior  Function            PT Goals (current goals can now be found in the care plan section) Acute Rehab PT Goals Patient Stated Goal: feel better, go home. He just wants to feel safe at home as he had so many falls prior to admission PT Goal Formulation: With patient Time For Goal Achievement: 09/02/22 Potential to Achieve Goals: Good Progress towards PT goals: Progressing toward goals    Frequency    Min 2X/week      PT Plan Current plan remains appropriate    Co-evaluation              AM-PAC PT "6 Clicks" Mobility   Outcome Measure  Help  needed turning from your back to your side while in a flat bed without using bedrails?: None Help needed moving from lying on your back to sitting on the side of a flat bed without using bedrails?: None Help needed moving to and from a bed to a chair (including a wheelchair)?: A Little Help needed standing up from a chair using your arms (e.g., wheelchair or bedside chair)?: A Little Help needed to walk in hospital room?: A Little Help needed climbing 3-5 steps with a railing? : A Little 6 Click Score: 20    End of Session Equipment Utilized During Treatment: Gait belt Activity Tolerance: Patient tolerated treatment well Patient left: in bed;with call bell/phone within reach;with bed alarm set Nurse Communication: Mobility status PT Visit Diagnosis: Muscle weakness (generalized) (M62.81);Difficulty in walking, not elsewhere classified (R26.2)     Time: 8657-8469 PT Time Calculation (min) (ACUTE ONLY): 25 min  Charges:  $Gait Training: 23-37 mins                     Secily Walthour, PT, GCS 08/23/22,3:44 PM

## 2022-08-23 NOTE — Progress Notes (Signed)
PT Cancellation Note  Patient Details Name: Nicholas [name] MRN: [id number] DOB: May 14, 1951   Cancelled Treatment:    Reason Eval/Treat Not Completed: Patient declined, no reason specified Patient wants to wait until later. Will try to re-attempt later as time allows.   [name] 08/23/2022, 10:55 AM

## 2022-08-23 NOTE — Progress Notes (Signed)
   Nicholas [name] has been requested to perform a transesophageal echocardiogram on Nicholas [name] for bacteremia/Afib.  After careful review of history and examination, the risks and benefits of transesophageal echocardiogram have been explained including risks of esophageal damage, perforation (1:10,000 risk), bleeding, pharyngeal hematoma as well as other potential complications associated with conscious sedation including aspiration, arrhythmia, respiratory failure and death. Alternatives to treatment were discussed, questions were answered. Patient is willing to proceed.   Jaisha Villacres Ninfa Meeker, PA-C  08/23/2022 10:57 AM

## 2022-08-23 NOTE — Progress Notes (Signed)
Occupational Therapy Treatment Patient Details Name: Nicholas [name] MRN: [id number] DOB: 1950/12/22 Today's Date: 08/23/2022   History of present illness Nicholas [name] is a 66, past medical history CAD/MI, GERD, PFO, remote history of seizure, questionable history of stroke/CHF.  Presented to the 08/16/2022 from home via EMS for fall,  found to be COVID +   OT comments  Nicholas [name] was seen for OT treatment on this date. Upon arrival to room pt seated on toilet, agreeable to tx. Pt requires SUPERVISION + RW for toielt t/f, pericare sitting, and hand washing standing sink side. Pt reports fatigue, requests to return to bed. Discussed falls safety strategies. Pt making progress toward goals, will continue to follow POC. Discharge recommendation remains appropriate.     Recommendations for follow up therapy are one component of a multi-disciplinary discharge planning process, led by the attending physician.  Recommendations may be updated based on patient status, additional functional criteria and insurance authorization.    Follow Up Recommendations  Home health OT    Assistance Recommended at Discharge Intermittent Supervision/Assistance  Patient can return home with the following  Help with stairs or ramp for entrance   Equipment Recommendations  BSC/3in1    Recommendations for Other Services      Precautions / Restrictions Precautions Precautions: Fall Restrictions Weight Bearing Restrictions: No       Mobility Bed Mobility Overal bed mobility: Independent                  Transfers Overall transfer level: Needs assistance Equipment used: Rolling walker (2 wheels) Transfers: Sit to/from Stand Sit to Stand: Supervision                 Balance Overall balance assessment: Needs assistance Sitting-balance support: No upper extremity supported Sitting balance-Leahy Scale: Normal     Standing balance support: No upper extremity supported, During functional  activity Standing balance-Leahy Scale: Good                             ADL either performed or assessed with clinical judgement   ADL Overall ADL's : Needs assistance/impaired                                       General ADL Comments: SUPERVISION + RW for toielt t/f, pericare sitting, and hand washing standing sink side.      Cognition Arousal/Alertness: Awake/alert Behavior During Therapy: WFL for tasks assessed/performed Overall Cognitive Status: Within Functional Limits for tasks assessed                                                     Pertinent Vitals/ Pain       Pain Assessment Pain Assessment: No/denies pain   Frequency  Min 2X/week        Progress Toward Goals  OT Goals(current goals can now be found in the care plan section)  Progress towards OT goals: Progressing toward goals  Acute Rehab OT Goals Patient Stated Goal: to go home OT Goal Formulation: With patient Time For Goal Achievement: 09/03/22 Potential to Achieve Goals: Good ADL Goals Pt Will Perform Grooming: with modified independence;standing Pt Will Perform Lower Body Dressing: with modified  independence;sit to/from stand Pt Will Transfer to Toilet: with modified independence;ambulating;regular height toilet  Plan Discharge plan remains appropriate;Frequency remains appropriate    Co-evaluation                 AM-PAC OT "6 Clicks" Daily Activity     Outcome Measure   Help from another person eating meals?: None Help from another person taking care of personal grooming?: A Little Help from another person toileting, which includes using toliet, bedpan, or urinal?: A Little Help from another person bathing (including washing, rinsing, drying)?: A Little Help from another person to put on and taking off regular upper body clothing?: None Help from another person to put on and taking off regular lower body clothing?: A Little 6  Click Score: 20    End of Session    OT Visit Diagnosis: Other abnormalities of gait and mobility (R26.89)   Activity Tolerance Patient tolerated treatment well   Patient Left in bed;with call bell/phone within reach;with nursing/sitter in room   Nurse Communication          Time: 6016-5800 OT Time Calculation (min): 14 min  Charges: OT General Charges $OT Visit: 1 Visit OT Treatments $Self Care/Home Management : 8-22 mins  Dessie Coma, M.S. OTR/L  08/23/22, 1:42 PM  ascom [phone]

## 2022-08-24 ENCOUNTER — Inpatient Hospital Stay (HOSPITAL_COMMUNITY)
Admit: 2022-08-24 | Discharge: 2022-08-24 | Disposition: A | Discharge status: Home / Self Care | Payer: Medicare Other | Attending: Cardiovascular Disease | Admitting: Cardiovascular Disease

## 2022-08-24 ENCOUNTER — Inpatient Hospital Stay: Payer: Medicare Other | Admitting: Anesthesiology

## 2022-08-24 ENCOUNTER — Encounter
Admission: EM | Disposition: A | Discharge status: Home Health | Payer: Self-pay | Source: Home / Self Care | Attending: Osteopathic Medicine

## 2022-08-24 DIAGNOSIS — W19XXXA Unspecified fall, initial encounter: Secondary | ICD-10-CM | POA: Diagnosis not present

## 2022-08-24 DIAGNOSIS — U071 COVID-19: Secondary | ICD-10-CM | POA: Diagnosis not present

## 2022-08-24 DIAGNOSIS — I330 Acute and subacute infective endocarditis: Secondary | ICD-10-CM

## 2022-08-24 DIAGNOSIS — R7881 Bacteremia: Secondary | ICD-10-CM | POA: Diagnosis not present

## 2022-08-24 DIAGNOSIS — R0602 Shortness of breath: Secondary | ICD-10-CM

## 2022-08-24 DIAGNOSIS — R531 Weakness: Secondary | ICD-10-CM | POA: Diagnosis not present

## 2022-08-24 DIAGNOSIS — I351 Nonrheumatic aortic (valve) insufficiency: Secondary | ICD-10-CM

## 2022-08-24 DIAGNOSIS — I4891 Unspecified atrial fibrillation: Secondary | ICD-10-CM

## 2022-08-24 DIAGNOSIS — I339 Acute and subacute endocarditis, unspecified: Secondary | ICD-10-CM

## 2022-08-24 DIAGNOSIS — I340 Nonrheumatic mitral (valve) insufficiency: Secondary | ICD-10-CM

## 2022-08-24 HISTORY — PX: CARDIOVERSION: SHX1299

## 2022-08-24 HISTORY — PX: TEE WITHOUT CARDIOVERSION: SHX5443

## 2022-08-24 LAB — CBC
HCT: 40.7 % (ref 39.0–52.0)
Hemoglobin: 13.9 g/dL (ref 13.0–17.0)
MCH: 32.4 pg (ref 26.0–34.0)
MCHC: 34.2 g/dL (ref 30.0–36.0)
MCV: 94.9 fL (ref 80.0–100.0)
Platelets: 176 10*3/uL (ref 150–400)
RBC: 4.29 MIL/uL (ref 4.22–5.81)
RDW: 13.3 % (ref 11.5–15.5)
WBC: 14.6 10*3/uL — ABNORMAL HIGH (ref 4.0–10.5)
nRBC: 0 % (ref 0.0–0.2)

## 2022-08-24 LAB — BASIC METABOLIC PANEL
Anion gap: 5 (ref 5–15)
BUN: 22 mg/dL (ref 8–23)
CO2: 26 mmol/L (ref 22–32)
Calcium: 7.5 mg/dL — ABNORMAL LOW (ref 8.9–10.3)
Chloride: 104 mmol/L (ref 98–111)
Creatinine, Ser: 1 mg/dL (ref 0.61–1.24)
GFR, Estimated: >60 mL/min (ref >60)
Glucose, Bld: 93 mg/dL (ref 70–99)
Potassium: 4.2 mmol/L (ref 3.5–5.1)
Sodium: 135 mmol/L (ref 135–145)

## 2022-08-24 LAB — CULTURE, BLOOD (ROUTINE X 2)
Culture: NO GROWTH
Culture: NO GROWTH
Special Requests: ADEQUATE
Special Requests: ADEQUATE

## 2022-08-24 LAB — MAGNESIUM: Magnesium: 2.2 mg/dL (ref 1.7–2.4)

## 2022-08-24 SURGERY — CARDIOVERSION
Anesthesia: General

## 2022-08-24 MED ORDER — PROPOFOL 10 MG/ML IV BOLUS
INTRAVENOUS | Status: DC | PRN
  Administered 2022-08-24 (×4): 20 mg via INTRAVENOUS
  Administered 2022-08-24: 50 mg via INTRAVENOUS
  Administered 2022-08-24: 20 mg via INTRAVENOUS

## 2022-08-24 MED ORDER — SODIUM CHLORIDE 0.9% FLUSH
10.0000 mL | Freq: Two times a day (BID) | INTRAVENOUS | Status: DC
  Administered 2022-08-24: 10 mL
  Administered 2022-08-24 – 2022-08-25 (×2): 20 mL

## 2022-08-24 MED ORDER — LIDOCAINE VISCOUS HCL 2 % MT SOLN
OROMUCOSAL | Status: AC
  Filled 2022-08-24: qty 15

## 2022-08-24 MED ORDER — SODIUM CHLORIDE 0.9% FLUSH: 10.0000 mL | INTRAVENOUS | Status: DC | PRN

## 2022-08-24 MED ORDER — BUTAMBEN-TETRACAINE-BENZOCAINE 2-2-14 % EX AERO
INHALATION_SPRAY | CUTANEOUS | Status: AC
  Filled 2022-08-24: qty 5

## 2022-08-24 NOTE — Progress Notes (Signed)
Occupational Therapy Treatment Patient Details Name: Nicholas [name] MRN: [id number] DOB: [birth year] Today's Date: 08/24/2022   History of present illness Nicholas [name] is a 58, past medical history CAD/MI, GERD, PFO, remote history of seizure, questionable history of stroke/CHF.  Presented to the 08/16/2022 from home via EMS for fall,  found to be COVID +   OT comments  Nicholas [name] was seen for OT treatment on this date. Upon arrival to room pt reclined in bed, agreeable to tx. Pt requires SUPERVISION + RW for funcitonal mobility >200 ft and functional reaching task organizing items on table while standing, no LOBs. Denies feeling SOB with activity. Discussed falls prevention at home and pt agreeable to looking for help with IADLs. Pt making good progress toward goals, will continue to follow POC. Discharge recommendation remains appropriate.     Recommendations for follow up therapy are one component of a multi-disciplinary discharge planning process, led by the attending physician.  Recommendations may be updated based on patient status, additional functional criteria and insurance authorization.    Follow Up Recommendations  Home health OT    Assistance Recommended at Discharge Intermittent Supervision/Assistance  Patient can return home with the following  Help with stairs or ramp for entrance   Equipment Recommendations  BSC/3in1    Recommendations for Other Services      Precautions / Restrictions Precautions Precautions: Fall Restrictions Weight Bearing Restrictions: No       Mobility Bed Mobility Overal bed mobility: Modified Independent             General bed mobility comments: increased time    Transfers Overall transfer level: Needs assistance Equipment used: Rolling walker (2 wheels) Transfers: Sit to/from Stand Sit to Stand: Supervision                 Balance Overall balance assessment: Needs assistance Sitting-balance support: Feet  supported Sitting balance-Leahy Scale: Normal     Standing balance support: No upper extremity supported, During functional activity Standing balance-Leahy Scale: Fair                             ADL either performed or assessed with clinical judgement   ADL Overall ADL's : Needs assistance/impaired                                       General ADL Comments: SUPERVISION + RW for funcitonal mobility >200 ft and functional reaching task organizing items on table while standing, no LOBs      Cognition Arousal/Alertness: Awake/alert Behavior During Therapy: WFL for tasks assessed/performed Overall Cognitive Status: Within Functional Limits for tasks assessed                                                     Pertinent Vitals/ Pain       Pain Assessment Pain Assessment: No/denies pain   Frequency  Min 2X/week        Progress Toward Goals  OT Goals(current goals can now be found in the care plan section)  Progress towards OT goals: Progressing toward goals  Acute Rehab OT Goals Patient Stated Goal: to go home OT Goal Formulation: With patient Time For Goal Achievement:  09/03/22 Potential to Achieve Goals: Good ADL Goals Pt Will Perform Grooming: with modified independence;standing Pt Will Perform Lower Body Dressing: with modified independence;sit to/from stand Pt Will Transfer to Toilet: with modified independence;ambulating;regular height toilet  Plan Discharge plan remains appropriate;Frequency remains appropriate    Co-evaluation                 AM-PAC OT "6 Clicks" Daily Activity     Outcome Measure   Help from another person eating meals?: None Help from another person taking care of personal grooming?: A Little Help from another person toileting, which includes using toliet, bedpan, or urinal?: A Little Help from another person bathing (including washing, rinsing, drying)?: A Little Help from  another person to put on and taking off regular upper body clothing?: None Help from another person to put on and taking off regular lower body clothing?: A Little 6 Click Score: 20    End of Session    OT Visit Diagnosis: Other abnormalities of gait and mobility (R26.89)   Activity Tolerance Patient tolerated treatment well   Patient Left in chair;with call bell/phone within reach;with chair alarm set   Nurse Communication          Time: 7062-3762 OT Time Calculation (min): 23 min  Charges: OT General Charges $OT Visit: 1 Visit OT Treatments $Therapeutic Activity: 23-37 mins  Nicholas [name], M.S. OTR/L  08/24/22, 3:00 PM  ascom [phone]

## 2022-08-24 NOTE — Progress Notes (Signed)
Rounding Note    Patient Name: Nicholas [name] Date of Encounter: 08/24/2022  [place] Cardiologist: new to [facility]- Dr. Saunders Revel  Subjective   Reports feeling well, Cough improving TEE and cardioversion completed this morning, normal sinus rhythm restored Heart rate running in the 70s  Inpatient Medications    Scheduled Meds:  apixaban  5 mg Oral BID   vitamin C  500 mg Oral Daily   B-complex with vitamin C  1 tablet Oral Daily   Chlorhexidine Gluconate Cloth  6 each Topical Daily   cholecalciferol  1,000 Units Oral Daily   divalproex  500 mg Oral Daily   ferrous sulfate  325 mg Oral Q breakfast   fluticasone  2 spray Each Nare Daily   furosemide  20 mg Oral q morning   gabapentin  300 mg Oral Q breakfast   And   gabapentin  600 mg Oral BID   influenza vaccine adjuvanted  0.5 mL Intramuscular Tomorrow-1000   levothyroxine  112 mcg Oral Q0600   loratadine  10 mg Oral Daily   metoprolol succinate  100 mg Oral Daily   mupirocin ointment  1 Application Nasal BID   pantoprazole  40 mg Oral QODAY   sodium chloride flush  10-40 mL Intracatheter Q12H   tamsulosin  0.4 mg Oral Daily   zinc sulfate  220 mg Oral Daily   Continuous Infusions:   ceFAZolin (ANCEF) IV 2 g (08/24/22 0654)   PRN Meds: chlorpheniramine-HYDROcodone, EPINEPHrine, guaiFENesin-dextromethorphan, magnesium hydroxide, methocarbamol, nitroGLYCERIN, ondansetron **OR** ondansetron (ZOFRAN) IV, promethazine, sodium chloride flush, tiZANidine, traZODone   Vital Signs    Vitals:   08/24/22 0845 08/24/22 0850 08/24/22 0900 08/24/22 0910  BP: (!) 88/59 (!) '92/58 91/62 90/65 '$  Pulse: 71 71 67 72  Resp: 16 (!) '21 19 19  '$ Temp:      TempSrc:      SpO2: 100% 100% 100% 96%  Weight:      Height:        Intake/Output Summary (Last 24 hours) at 08/24/2022 1257 Last data filed at 08/24/2022 4970 Gross per 24 hour  Intake 340 ml  Output 950 ml  Net -610 ml      08/24/2022    7:26 AM 08/21/2022     6:20 AM 08/17/2022    8:32 AM  Last 3 Weights  Weight (lbs) 249 lb 9.6 oz 249 lb 9.6 oz 235 lb  Weight (kg) 113.218 kg 113.218 kg 106.595 kg      Telemetry    NSR - Personally Reviewed  ECG     - Personally Reviewed  Physical Exam   GEN: No acute distress.   Neck: No JVD Cardiac: RRR, no murmurs, rubs, or gallops.  Respiratory: Clear to auscultation bilaterally. GI: Soft, nontender, non-distended  MS: No edema; No deformity. Neuro:  Nonfocal  Psych: Normal affect   Labs    High Sensitivity Troponin:   Recent Labs  Lab 08/12/22 0623 08/12/22 1058 08/16/22 2202 08/17/22 0105 08/17/22 0224  TROPONINIHS 8 8 56* 48* 44*     Chemistry Recent Labs  Lab 08/19/22 0616 08/20/22 0542 08/21/22 0804 08/22/22 0731 08/23/22 0546 08/24/22 0651  NA 137   < > 136 136 138 135  K 4.0   < > 4.0 4.9 5.2* 4.2  CL 103   < > 104 104 104 104  CO2 26   < > '28 26 31 26  '$ GLUCOSE 178*   < > 132* 105* 102* 93  BUN 27*   < > 35* 30* 28* 22  CREATININE 0.90   < > 0.91 1.00 1.02 1.00  CALCIUM 7.8*   < > 7.7* 7.9* 7.5* 7.5*  MG 2.2  --   --   --   --  2.2  PROT 5.7*   < > 5.4* 5.7* 5.2*  --   ALBUMIN 2.6*   < > 2.5* 2.6* 2.3*  --   AST 29   < > 49* 42* 36  --   ALT 41   < > 69* 69* 56*  --   ALKPHOS 47   < > 45 50 49  --   BILITOT 1.0   < > 0.9 1.1 0.9  --   GFRNONAA >60   < > >60 >60 >60 >60  ANIONGAP 8   < > 4* 6 3* 5   < > = values in this interval not displayed.    Lipids No results for input(s): "CHOL", "TRIG", "HDL", "LABVLDL", "LDLCALC", "CHOLHDL" in the last 168 hours.  Hematology Recent Labs  Lab 08/21/22 0804 08/22/22 0731 08/24/22 0651  WBC 14.7* 12.7* 14.6*  RBC 4.49 4.75 4.29  HGB 14.0 15.1 13.9  HCT 41.1 44.8 40.7  MCV 91.5 94.3 94.9  MCH 31.2 31.8 32.4  MCHC 34.1 33.7 34.2  RDW 12.9 13.0 13.3  PLT 207 134* 176   Thyroid No results for input(s): "TSH", "FREET4" in the last 168 hours.  BNPNo results for input(s): "BNP", "PROBNP" in the last 168 hours.   DDimer No results for input(s): "DDIMER" in the last 168 hours.   Radiology    No results found.  Cardiac Studies     Patient Profile     71 y.o. male with history of CVA presenting with fall, diagnosed with hypertension and MSSA bacteremia.  Being seen for A-fib with rapid ventricular response.    Assessment & Plan    Atrial fibrillation with RVR Transesophageal echo and cardioversion this morning Normal sinus rhythm restored We will plan to continue Eliquis 5 twice daily,  metoprolol succinate 100 daily unless blood pressure/heart rate run low in which case dosing could be reduced  MSSA bacteremia/COVID Transesophageal echo completed, no vegetation On antibiotic per ID COVID infection more than 10 days ago, appears to have made good recovery   Elevated troponin Minimally elevated troponin, likely supply/demand mismatch Prior left heart catheterization 2019 no significant coronary disease Plan for ischemic work-up this admission    Total encounter time more than 35 minutes  Greater than 50% was spent in counseling and coordination of care with the patient   For questions or updates, please contact [place] Please consult [email or web address] for contact info under        Signed, Ida Rogue, MD  08/24/2022, 12:57 PM

## 2022-08-24 NOTE — Progress Notes (Signed)
Transesophageal Echocardiogram :  Indication: rule out endocarditis, atrial fib, rule out thrombus Requesting/ordering  physician: Dr. Ouida Sills  Procedure: Benzocaine spray x2 and 2 mls x 2 of viscous lidocaine were given orally to provide local anesthesia to the oropharynx. The patient was positioned supine on the left side, bite block provided. The patient was moderately sedated with the doses of versed and fentanyl as detailed below.  Using digital technique an omniplane probe was advanced into the distal esophagus without incident.   Moderate sedation: 1. Sedation used:  per anesthesia, Dr. Wynetta Emery  See report in [facility]  for complete details: In brief,  No valve vegetation transgastric imaging revealed normal LV function with no RWMAs and no mural apical thrombus.  .  Estimated ejection fraction was 55%.  Right sided cardiac chambers were normal with no evidence of pulmonary hypertension.  Imaging of the septum showed no ASD or VSD Bubble study was negative for shunt 2D and color flow confirmed no PFO  Mild to moderate MR, mild AI  The LA was well visualized in orthogonal views.  There was no spontaneous contrast and no thrombus in the LA and LA appendage   The descending thoracic aorta had no  mural aortic debris with no evidence of aneurysmal dilation or disection  Cardioversion to follow   Nicholas [name] 08/24/2022 8:46 AM

## 2022-08-24 NOTE — Progress Notes (Signed)
*  PRELIMINARY RESULTS* Echocardiogram Echocardiogram Transesophageal has been performed.  Nicholas [name] 08/24/2022, 8:28 AM

## 2022-08-24 NOTE — Progress Notes (Signed)
[facility] for Infectious Disease  Date of Admission:  08/16/2022           Reason for visit: Follow up on MSSA bacteremia   Current antibiotics: Cefazolin 11/3-present  ASSESSMENT:    71 y.o. male admitted with:  # MSSA bacteremia # Recent COVID infection   - COVID PCR positive 10/29.  Now off precautions - blood cx positive 11/2 - repeat cx finalized negative from 11/5 - unclear source.  No blood cx drawn during [place] admission 10/29-11/1 - on room air, chest imaging not convincing for bacterial pneumonia - possible related to prior IV access attempts with multiple bruises on arms and patient report of being a difficult IV stick - TTE negative, TEE negative - Left first met ulcer (followed by Dr [name] outpatient) does not appear infected or a likely source  RECOMMENDATIONS:    See OPAT below  Diagnosis: Bacteremia  Culture Result: MSSA  Allergies  Allergen Reactions   Peanut-Containing Drug Products Anaphylaxis and Dermatitis   Yellow Jacket Venom [Bee Venom] Anaphylaxis and Other (See Comments)    Respiratory Distress   Celebrex [Celecoxib] Other (See Comments)    BLACK STOOL?MELENA?   Percocet [Oxycodone-Acetaminophen] Hives, Swelling and Other (See Comments)   Latex Rash   Nsaids Rash    OPAT Orders  Discharge antibiotics to be given via mid line Discharge antibiotics: Per pharmacy protocol  Cefazolin 2 gm q8h IV through 09/01/22 Followed by Linezolid 616m BID PO x 2 weeks from 09/02/22-09/16/22   Midline Care Per Protocol:  Home health RN for IV administration and teaching; Midline care and labs.    Labs weekly while on IV antibiotics: _xx_ CBC with differential _xx_ BMP __ CMP __ CRP __ ESR __ Vancomycin trough __ CK  _xx_ Please pull midline at completion of IV antibiotics  Fax weekly labs to ([phone] Clinic Follow Up Appt: 09/10/22 at 3:30 [facility] [place]fectious [facility] [phone] 08/24/2022, 11:10 AM

## 2022-08-24 NOTE — Transfer of Care (Signed)
Immediate Anesthesia Transfer of Care Note  Patient: DORY VERDUN  Procedure(s) Performed: CARDIOVERSION TRANSESOPHAGEAL ECHOCARDIOGRAM (TEE)  Patient Location: Cath Lab  Anesthesia Type:General  Level of Consciousness: awake  Airway & Oxygen Therapy: Patient Spontanous Breathing and Patient connected to face mask oxygen  Post-op Assessment: Report given to RN  Post vital signs: stable  Last Vitals:  Vitals Value Taken Time  BP 87/59 08/24/22 0826  Temp    Pulse 72 08/24/22 0827  Resp 25 08/24/22 0827  SpO2 100 % 08/24/22 0827  Vitals shown include unvalidated device data.  Last Pain:  Vitals:   08/24/22 0726  TempSrc: Oral  PainSc: 0-No pain         Complications: No notable events documented.

## 2022-08-24 NOTE — Progress Notes (Signed)
PROGRESS NOTE  Nicholas [name]    DOB: [birth year], 71 y.o.  FAO:[id number]    Code Status: Full Code   DOA: 08/16/2022   LOS: 6   Brief hospital course  Nicholas [name] is a 71 y.o. male with a PMH significant for CHF, hypothyroidism, HLD, seizure disorder, Afib RVR, covid-19, Type 2 DM.  They presented from home to the ED on 08/16/2022 with generalized weakness and falls x a couple days. Also endorsed cough. Diagnosed with COVID-19 4 days prior to presentation and was taking paxlovid to complete on 11/6.  In the ED, it was found that they had hypotensive with systolic BP in the 69G and his pulse oximetry was in the 80s.  He was also noted to be tachycardic with heart rate of 140. Labs revealed borderline potassium of 3.5 and a BUN of 27 with albumin 3.3, AST 65 and ALT 48.  Total CK was 450 and high sensitive troponin I was 56.  CBC showed leukocytosis of 13 with neutrophilia.  Blood cultures were drawn.  UA was negative.  Significant findings included EKG showed atrial fibrillation with rapid ventricular sponsor 117.Portable chest ray showed cardiomegaly with mild central congestion with improved overall appearance compared to 08/12/2022 with patchy atelectasis at the left base.   They were initially treated with 4 mg of IV Zofran and 1 L bolus of IV normal saline. Cardiology was consulted.   Patient was admitted to medicine service for further workup and management of acute hypoxic respiratory failure as outlined in detail below.  08/24/22 -feeling improved. Wanting to be discharged as soon as possible. Planning to dc tomorrow  Assessment & Plan  Principal Problem:   MSSA bacteremia Active Problems:   Chronic diastolic CHF (congestive heart failure) (HCC)   Hypothyroidism   Dyslipidemia   Weakness   Seizure disorder (HCC)   Atrial fibrillation with RVR ([place])   Fall   COVID-19   Bacteremia   Paroxysmal atrial fibrillation (HCC)   Type 2 diabetes mellitus with foot ulcer, without  long-term current use of insulin (HCC)   Persistent atrial fibrillation (HCC)   Atrial fibrillation with controlled ventricular rate ([place])   Sepsis ([place])  COVID-19 w/ acute hypoxic respiratory failure and pneumonia. Day 12 since diagnosis. Currently ORA - discontinue PPE precautions, per hospital policy Completed Paxlovid Completed Steroids  PT/OT for physical deconditioning. Recommending HH   Atrial Fibrillation w/ RVR  HFpEF (EF 55-60%)  hypotension (resolved)- CHADS2VASc 3-4 Cardiology following, appreciate care TEE with cardioversion 11/10. Negative for vegetation Continue eliquis Rate control w/ metoprolol   Elevated high sensitivity troponin: Likely demand ischemia in the setting of recent Covid infection and Afib with RVR LHC in 2019 showed no significant CAD Cardiology following    MSSA Bacteremia- with unknown source. Confirmed on 2 BxCxs. TEE 11/10 negative for vegetations. Midline placed. Can receive outpatient IV Abx continue Cefazolin IV 08/18/22- ID following, appreciate your care  Migraine 11/05 - resolved Continue depakote ppx NSAID allergy - can't have this HTN - can't safely given triptan until BP improved Ordered phenergan Consider Fioricet     Hypothyroidism Synthroid.   Seizure disorder (HCC) Hx migraine  Depakote DR.   Dyslipidemia hold off statin therapy given elevated CK level which could be related to mild rhabdomyolysis vs COVID inflammation  Body mass index is 37.95 kg/m.  VTE ppx:  apixaban (ELIQUIS) tablet 5 mg   Diet:     Diet   Diet NPO time specified   Consultants: Cardiology  ID Subjective 08/24/22    Pt reports feeling overall well. Has no questions or concerns at this time.   Objective   Vitals:   08/23/22 1948 08/23/22 2311 08/24/22 0417 08/24/22 0726  BP: (!) '87/49 96/66 98/68 '$ 116/76  Pulse: 78 (!) 51 79 84  Resp: '18 18 18 17  '$ Temp: 98.6 F (37 C) 98.1 F (36.7 C) 98.8 F (37.1 C) 98.7 F (37.1 C)   TempSrc:    Oral  SpO2: 96% 96% 97% 96%  Weight:    113.2 kg  Height:    '5\' 8"'$  (1.727 m)    Intake/Output Summary (Last 24 hours) at 08/24/2022 0748 Last data filed at 08/23/2022 2300 Gross per 24 hour  Intake 240 ml  Output 1700 ml  Net -1460 ml    Filed Weights   08/17/22 0832 08/21/22 0620 08/24/22 0726  Weight: 106.6 kg 113.2 kg 113.2 kg     Physical Exam:  General: awake, alert, NAD HEENT: atraumatic, clear conjunctiva, anicteric sclera, MMM, hearing grossly normal Respiratory: normal respiratory effort. Cardiovascular: quick capillary refill, normal S1/S2, RRR, no JVD, murmurs Nervous: A&O x3. no gross focal neurologic deficits, normal speech Extremities: moves all equally, no edema, normal tone Skin: dry, intact, normal temperature, normal color. No rashes, lesions or ulcers on exposed skin Psychiatry: normal mood, congruent affect  Labs   I have personally reviewed the following labs and imaging studies CBC    Component Value Date/Time   WBC 14.6 (H) 08/24/2022 0651   RBC 4.29 08/24/2022 0651   HGB 13.9 08/24/2022 0651   HGB 15.8 01/16/2022 1415   HCT 40.7 08/24/2022 0651   HCT 45.5 01/16/2022 1415   PLT 176 08/24/2022 0651   PLT 213 01/16/2022 1415   MCV 94.9 08/24/2022 0651   MCV 92 01/16/2022 1415   MCH 32.4 08/24/2022 0651   MCHC 34.2 08/24/2022 0651   RDW 13.3 08/24/2022 0651   RDW 12.6 01/16/2022 1415   LYMPHSABS 0.6 (L) 08/20/2022 0542   LYMPHSABS 1.6 01/16/2022 1415   MONOABS 1.1 (H) 08/20/2022 0542   EOSABS 0.0 08/20/2022 0542   EOSABS 0.3 01/16/2022 1415   BASOSABS 0.0 08/20/2022 0542   BASOSABS 0.1 01/16/2022 1415      Latest Ref Rng & Units 08/24/2022    6:51 AM 08/23/2022    5:46 AM 08/22/2022    7:31 AM  BMP  Glucose 70 - 99 mg/dL 93  102  105   BUN 8 - 23 mg/dL '22  28  30   '$ Creatinine 0.61 - 1.24 mg/dL 1.00  1.02  1.00   Sodium 135 - 145 mmol/L 135  138  136   Potassium 3.5 - 5.1 mmol/L 4.2  5.2  4.9   Chloride 98 - 111 mmol/L  104  104  104   CO2 22 - 32 mmol/L '26  31  26   '$ Calcium 8.9 - 10.3 mg/dL 7.5  7.5  7.9     No results found.  Disposition Plan & Communication  Patient status: Inpatient  Admitted From: Home Planned disposition location: [facility] Anticipated discharge date: 11/12 pending cardiac rhythm stable overnight and antibiotic outpatient services/HH set up  Family Communication: none    Author: Richarda Osmond, DO [facility] 08/24/2022, 7:48 AM   Available by Epic secure chat 7AM-7PM. If 7PM-7AM, please contact night-coverage.  [facility] contact information found on [email or web address].

## 2022-08-24 NOTE — Progress Notes (Signed)
PHARMACY CONSULT NOTE FOR:  OUTPATIENT  PARENTERAL ANTIBIOTIC THERAPY (OPAT)  Indication: MSSA bacteremia Regimen: Cefazolin 2 g IV q8H through 09/01/2022 Followed by linezolid 600 mg PO q12H through 09/16/2022 End date: 09/01/2022 for cefazolin, and 09/16/2022 for linezolid  IV antibiotic discharge orders are pended. To discharging provider:  please sign these orders via discharge navigator,  Select New Orders & click on the button choice - Manage This Unsigned Work.    Thank you for allowing pharmacy to be a part of this patient's care.  Dara Hoyer, PharmD PGY-1 Pharmacy Resident 08/24/2022 11:50 AM

## 2022-08-24 NOTE — CV Procedure (Signed)
Cardioversion procedure note °For atrial fibrillation, persistent ° °Procedure Details: ° °Consent: Risks of procedure as well as the alternatives and risks of each were explained to the (patient/caregiver).  Consent for procedure obtained. ° °Time Out: Verified patient identification, verified procedure, site/side was marked, verified correct patient position, special equipment/implants available, medications/allergies/relevent history reviewed, required imaging and test results available.  Performed ° °Patient placed on cardiac monitor, pulse oximetry, supplemental oxygen as necessary.   °Sedation given: propofol IV, Dr. Johnson °Pacer pads placed anterior and posterior chest. ° ° °Cardioverted 1 time(s).   °Cardioverted at  150 J. Synchronized biphasic °Converted to NSR ° ° °Evaluation: °Findings: Post procedure EKG shows: NSR °Complications: None °Patient did tolerate procedure well. ° °Time Spent Directly with the Patient: ° °45 minutes  ° °Nicholas [name], M.D., Ph.D.  °

## 2022-08-24 NOTE — Progress Notes (Signed)
PT Cancellation Note  Patient Details Name: Nicholas [name] MRN: [id number] DOB: 1951/09/25   Cancelled Treatment:    Reason Eval/Treat Not Completed: Patient at procedure or test/unavailable. Patient not in room upon PT attempt. Will attempt again at later time/date as available.    Janna Arch, PT, DPT  08/24/2022, 8:44 AM

## 2022-08-24 NOTE — Plan of Care (Signed)
  Problem: Health Behavior/Discharge Planning: Goal: Ability to manage health-related needs will improve Outcome: Progressing   Problem: Clinical Measurements: Goal: Will remain free from infection Outcome: Progressing   Problem: Clinical Measurements: Goal: Respiratory complications will improve Outcome: Progressing   Problem: Clinical Measurements: Goal: Cardiovascular complication will be avoided Outcome: Progressing   Problem: Safety: Goal: Ability to remain free from injury will improve Outcome: Progressing   

## 2022-08-25 DIAGNOSIS — W19XXXA Unspecified fall, initial encounter: Secondary | ICD-10-CM | POA: Diagnosis not present

## 2022-08-25 DIAGNOSIS — I4891 Unspecified atrial fibrillation: Secondary | ICD-10-CM | POA: Diagnosis not present

## 2022-08-25 DIAGNOSIS — R7881 Bacteremia: Secondary | ICD-10-CM | POA: Diagnosis not present

## 2022-08-25 DIAGNOSIS — U071 COVID-19: Secondary | ICD-10-CM | POA: Diagnosis not present

## 2022-08-25 MED ORDER — APIXABAN 5 MG PO TABS: 5.0000 mg | ORAL_TABLET | Freq: Two times a day (BID) | ORAL | 0 refills | Status: DC

## 2022-08-25 MED ORDER — CEFAZOLIN IV (FOR PTA / DISCHARGE USE ONLY): 2.0000 g | Freq: Three times a day (TID) | INTRAVENOUS | 0 refills | Status: DC

## 2022-08-25 MED ORDER — LINEZOLID 600 MG PO TABS: 600.0000 mg | ORAL_TABLET | Freq: Two times a day (BID) | ORAL | 0 refills | Status: AC

## 2022-08-25 NOTE — Progress Notes (Addendum)
Son stated retail pharmacy could not fill in Abx Rx, they don't carry that type of Abx. He was told only a specialty or out patient pharmacy can fill it in.Dr. Ouida Sills and pharmacists notified.  Per Pharmacists: "looks like everything should be taken care of by home health ([facility]). I would guess that if there are any issues with his OPAT that they should contact [facility]. I believe that this is the 24/7 number [phone]."  Information relayed to Marlan Palau (Son) to contact [facility].   1510: Son stated [facility] does not have him on their schedule for tonight and they do not provide medication. They only do the teaching for patient on how to self administer the medication. They will have to be setup for home health infusion. Dr. Ouida Sills and social worker notified.

## 2022-08-25 NOTE — Progress Notes (Signed)
1547: Notified that patient was to have home IV infusion set up prior to discharge as he will have home IV antibiotics until 11/18.  Pam with [facility] notified, they will be able to accept but can't start services until tomorrow afternoon.  MD made aware.  Cheryl with [facility] also made aware.    Valente David, RN, MSN, CCM [facility] RNCM

## 2022-08-25 NOTE — TOC Transition Note (Signed)
Transition of Care [facility]) - CM/SW Discharge Note   Patient Details  Name: LUQMAN PERRELLI MRN: [id number] Date of Birth: 04/06/1951  Transition of Care [facility] CM/SW Contact:  Valente David, RN Phone Number: 08/25/2022, 10:20 AM   Clinical Narrative:     Patient has discharge orders home, will go home with home health for PT/OT.  Was previously active with [facility] prior to admission, agrees to resume services through them.  Spoke with Malachy Mood, they are agreeable to resume services, aware that patient will discharge today.  No further TOC needs at this time.   Final next level of care: [place] Barriers to Discharge: Barriers Resolved   Patient Goals and CMS Choice Patient states their goals for this hospitalization and ongoing recovery are:: Home with home health CMS Medicare.gov Compare Post Acute Care list provided to:: Patient Choice offered to / list presented to : Patient  Discharge Placement                       Discharge Plan and Services                          HH Arranged: PT, OT [facility] Agency: [place] Date [place]: 08/25/22 Time HH Agency Contacted: 36 Representative spoke with at [place]: [place] Determinants of Health ([place]) Interventions     Readmission Risk Interventions     No data to display

## 2022-08-25 NOTE — Progress Notes (Signed)
Rounding Note    Patient Name: Nicholas [name] Date of Encounter: 08/25/2022  [facility] Cardiologist: [name], MD   Subjective   He remains in NSR s/p TEE/DCCV. Patient is overall feeling good. No chest pain or SOB.   Inpatient Medications    Scheduled Meds:  apixaban  5 mg Oral BID   vitamin C  500 mg Oral Daily   B-complex with vitamin C  1 tablet Oral Daily   Chlorhexidine Gluconate Cloth  6 each Topical Daily   cholecalciferol  1,000 Units Oral Daily   divalproex  500 mg Oral Daily   ferrous sulfate  325 mg Oral Q breakfast   fluticasone  2 spray Each Nare Daily   furosemide  20 mg Oral q morning   gabapentin  300 mg Oral Q breakfast   And   gabapentin  600 mg Oral BID   influenza vaccine adjuvanted  0.5 mL Intramuscular Tomorrow-1000   levothyroxine  112 mcg Oral Q0600   loratadine  10 mg Oral Daily   metoprolol succinate  100 mg Oral Daily   mupirocin ointment  1 Application Nasal BID   pantoprazole  40 mg Oral QODAY   sodium chloride flush  10-40 mL Intracatheter Q12H   tamsulosin  0.4 mg Oral Daily   zinc sulfate  220 mg Oral Daily   Continuous Infusions:   ceFAZolin (ANCEF) IV Stopped (08/25/22 0547)   PRN Meds: chlorpheniramine-HYDROcodone, EPINEPHrine, guaiFENesin-dextromethorphan, magnesium hydroxide, methocarbamol, nitroGLYCERIN, ondansetron **OR** ondansetron (ZOFRAN) IV, promethazine, sodium chloride flush, tiZANidine, traZODone   Vital Signs    Vitals:   08/24/22 0910 08/24/22 2005 08/25/22 0009 08/25/22 0512  BP: 90/65 108/60 (!) 103/57 110/66  Pulse: 72 81 87 84  Resp: 19 18 16 18   Temp:  98.5 F (36.9 C) 97.6 F (36.4 C) 98.5 F (36.9 C)  TempSrc:      SpO2: 96% 100% 97% 96%  Weight:      Height:        Intake/Output Summary (Last 24 hours) at 08/25/2022 0832 Last data filed at 08/25/2022 0517 Gross per 24 hour  Intake 360 ml  Output 1073 ml  Net -713 ml      08/24/2022    7:26 AM 08/21/2022    6:20 AM  08/17/2022    8:32 AM  Last 3 Weights  Weight (lbs) 249 lb 9.6 oz 249 lb 9.6 oz 235 lb  Weight (kg) 113.218 kg 113.218 kg 106.595 kg      Telemetry    NSR HR 60-70s - Personally Reviewed  ECG    No new - Personally Reviewed  Physical Exam   GEN: No acute distress.   Neck: No JVD Cardiac: RRR, no murmurs, rubs, or gallops.  Respiratory: Clear to auscultation bilaterally. GI: Soft, nontender, non-distended  MS: No edema; No deformity. Neuro:  Nonfocal  Psych: Normal affect   Labs    High Sensitivity Troponin:   Recent Labs  Lab 08/12/22 0623 08/12/22 1058 08/16/22 2202 08/17/22 0105 08/17/22 0224  TROPONINIHS 8 8 56* 48* 44*     Chemistry Recent Labs  Lab 08/19/22 0616 08/20/22 0542 08/21/22 0804 08/22/22 0731 08/23/22 0546 08/24/22 0651  NA 137   < > 136 136 138 135  K 4.0   < > 4.0 4.9 5.2* 4.2  CL 103   < > 104 104 104 104  CO2 26   < > 28 26 31 26   GLUCOSE 178*   < > 132*  105* 102* 93  BUN 27*   < > 35* 30* 28* 22  CREATININE 0.90   < > 0.91 1.00 1.02 1.00  CALCIUM 7.8*   < > 7.7* 7.9* 7.5* 7.5*  MG 2.2  --   --   --   --  2.2  PROT 5.7*   < > 5.4* 5.7* 5.2*  --   ALBUMIN 2.6*   < > 2.5* 2.6* 2.3*  --   AST 29   < > 49* 42* 36  --   ALT 41   < > 69* 69* 56*  --   ALKPHOS 47   < > 45 50 49  --   BILITOT 1.0   < > 0.9 1.1 0.9  --   GFRNONAA >60   < > >60 >60 >60 >60  ANIONGAP 8   < > 4* 6 3* 5   < > = values in this interval not displayed.    Lipids No results for input(s): "CHOL", "TRIG", "HDL", "LABVLDL", "LDLCALC", "CHOLHDL" in the last 168 hours.  Hematology Recent Labs  Lab 08/21/22 0804 08/22/22 0731 08/24/22 0651  WBC 14.7* 12.7* 14.6*  RBC 4.49 4.75 4.29  HGB 14.0 15.1 13.9  HCT 41.1 44.8 40.7  MCV 91.5 94.3 94.9  MCH 31.2 31.8 32.4  MCHC 34.1 33.7 34.2  RDW 12.9 13.0 13.3  PLT 207 134* 176   Thyroid No results for input(s): "TSH", "FREET4" in the last 168 hours.  BNPNo results for input(s): "BNP", "PROBNP" in the last 168  hours.  DDimer No results for input(s): "DDIMER" in the last 168 hours.   Radiology    ECHO TEE  Result Date: 08/24/2022    TRANSESOPHOGEAL ECHO REPORT   Patient Name:   Nicholas [name] Date of Exam: 08/24/2022 Medical Rec #:  [id number]      Height:       68.0 in Accession #:    [id number]     Weight:       249.6 lb Date of Birth:  May 28, 1951       BSA:          2.246 m Patient Age:    71 years       BP:           98/68 mmHg Patient Gender: M              HR:           79 bpm. Exam Location:  [facility] Procedure: Transesophageal Echo, Cardiac Doppler, Color Doppler and Saline            Contrast Bubble Study Indications:     SBE ( Subacute bacterial endocarditis) I33.0  History:         Patient has prior history of Echocardiogram examinations, most                  recent 08/20/2022. CHF. Heart attack, PFO.  Sonographer:     Cristela Blue Referring Phys:  [id number] Antonieta Iba Diagnosing Phys: Julien Nordmann MD PROCEDURE: After discussion of the risks and benefits of a TEE, an informed consent was obtained from the patient. TEE procedure time was 30 minutes. The transesophogeal probe was passed without difficulty through the esophogus of the patient. Local oropharyngeal anesthetic was provided with Cetacaine and viscous lidocaine. Sedation performed by different physician. The patient's vital signs; including heart rate, blood pressure, and oxygen saturation; remained stable throughout the procedure. The patient developed no complications during  the procedure. A direct current cardioversion was performed at 150 joules with 1 attempt.  IMPRESSIONS  1. Left ventricular ejection fraction, by estimation, is 60 to 65%. The left ventricle has normal function. The left ventricle has no regional wall motion abnormalities.  2. Right ventricular systolic function is normal. The right ventricular size is normal.  3. No left atrial/left atrial appendage thrombus was detected.  4. The mitral valve is normal in structure. Mild to  moderate mitral valve regurgitation. No evidence of mitral stenosis.  5. The aortic valve is normal in structure. Aortic valve regurgitation is mild. No aortic stenosis is present.  6. The inferior vena cava is normal in size with greater than 50% respiratory variability, suggesting right atrial pressure of 3 mmHg.  7. Agitated saline contrast bubble study was negative, with no evidence of any interatrial shunt  8. No valve vegetation noted. Conclusion(s)/Recommendation(s): Normal biventricular function without evidence of hemodynamically significant valvular heart disease. FINDINGS  Left Ventricle: Left ventricular ejection fraction, by estimation, is 60 to 65%. The left ventricle has normal function. The left ventricle has no regional wall motion abnormalities. The left ventricular internal cavity size was normal in size. There is  no left ventricular hypertrophy. Right Ventricle: The right ventricular size is normal. No increase in right ventricular wall thickness. Right ventricular systolic function is normal. Left Atrium: Left atrial size was normal in size. No left atrial/left atrial appendage thrombus was detected. Right Atrium: Right atrial size was normal in size. Pericardium: There is no evidence of pericardial effusion. Mitral Valve: The mitral valve is normal in structure. Mild to moderate mitral valve regurgitation. No evidence of mitral valve stenosis. There is no evidence of mitral valve vegetation. Tricuspid Valve: The tricuspid valve is normal in structure. Tricuspid valve regurgitation is not demonstrated. No evidence of tricuspid stenosis. There is no evidence of tricuspid valve vegetation. Aortic Valve: The aortic valve is normal in structure. Aortic valve regurgitation is mild. No aortic stenosis is present. There is no evidence of aortic valve vegetation. Pulmonic Valve: The pulmonic valve was normal in structure. Pulmonic valve regurgitation is not visualized. No evidence of pulmonic stenosis.  There is no evidence of pulmonic valve vegetation. Aorta: The aortic root is normal in size and structure. Venous: The inferior vena cava is normal in size with greater than 50% respiratory variability, suggesting right atrial pressure of 3 mmHg. IAS/Shunts: No atrial level shunt detected by color flow Doppler. Agitated saline contrast was given intravenously to evaluate for intracardiac shunting. Agitated saline contrast bubble study was negative, with no evidence of any interatrial shunt. Julien Nordmann MD Electronically signed by Julien Nordmann MD Signature Date/Time: 08/24/2022/3:43:10 PM    Final     Cardiac Studies   TTE 08/20/2022 1. Left ventricular ejection fraction, by estimation, is 55 to 60%. The  left ventricle has normal function. Left ventricular endocardial border  not optimally defined to evaluate regional wall motion. Left ventricular  diastolic parameters are  indeterminate.   2. Right ventricular systolic function is normal. The right ventricular  size is normal.   3. Left atrial size was mildly dilated.   4. The mitral valve is normal in structure. Moderate mitral valve  regurgitation. No evidence of mitral stenosis.   5. The aortic valve is normal in structure. Aortic valve regurgitation is  mild. No aortic stenosis is present.   6. Aortic dilatation noted. There is mild dilatation of the ascending  aorta, measuring 42 mm.   Patient Profile  71 y.o. male with a history of CVA presenting with fall, diagnosed with hypertension and MSSA bacteremia.  Being seen for A-fib with rapid ventricular response.   Assessment & Plan    Afib RVR - s/p TEE/DCCV 11/10 - remains in NSR - continue Eliquis 5mg  BID - Toprol XL 100mg  daily - we will continue to follow as outpatient   MSSA bacetermia/COVID - TEE with no vegetation - per ID/IM   Elevated troponin - no chest pain reported - HS troponin elevated to 56, not consistent with ACS - LHC in 2019 showed no significant  CAD - Noninvasive testing can he as outpatient  For questions or updates, please contact [facility] Please consult [email or web address] for contact info under       Signed, Vestal Crandall David Stall, PA-C  08/25/2022, 8:32 AM

## 2022-08-25 NOTE — Discharge Summary (Signed)
Physician Discharge Summary  Patient: Nicholas [name] BTD:[id number] DOB: 11/17/1950   Code Status: Full Code Admit date: 08/16/2022 Discharge date: 08/25/2022 Disposition: Relative's home, PT, OT, nurse aid, and RN PCP: [name], MD  Recommendations for Outpatient Follow-up:  Follow up with PCP within 1-2 weeks General hospital follow up including monitoring respiratory status s/p COVID Follow up with ID Regarding completion of his Abx course as outlined below and clearance of his infection Follow up with cardiology Regarding Afib  Discharge Diagnoses:  Principal Problem:   MSSA bacteremia Active Problems:   Chronic diastolic CHF (congestive heart failure) (HCC)   Hypothyroidism   Dyslipidemia   Weakness   Seizure disorder ([place])   Atrial fibrillation with RVR ([place])   Fall   COVID-19   Bacteremia   Paroxysmal atrial fibrillation ([place])   Type 2 diabetes mellitus with foot ulcer, without long-term current use of insulin ([place])   Persistent atrial fibrillation (HCC)   Atrial fibrillation with controlled ventricular rate (HCC)   Sepsis ([place])   Acute endocarditis   SOB (shortness of breath)   Atrial fibrillation with rapid ventricular response [facility])  Brief Hospital Course Summary: Nicholas [name] is a 71 y.o. male with a PMH significant for CHF, hypothyroidism, HLD, seizure disorder, Afib RVR, covid-19, Type 2 DM.   They presented from home to the ED on 08/16/2022 with generalized weakness and falls x a couple days. Also endorsed cough. Diagnosed with COVID-19 4 days prior on previous admission and was taking paxlovid to complete on 11/6.   In the ED, it was found that they had hypotensive with systolic BP in the 36I and his pulse oximetry was in the 80s.  He was also noted to be tachycardic with heart rate of 140. Labs revealed borderline potassium of 3.5 and a BUN of 27 with albumin 3.3, AST 65 and ALT 48.  Total CK was 450 and high sensitive troponin I was 56.  CBC  showed leukocytosis of 13 with neutrophilia.  Blood cultures were drawn.  UA was negative.  Significant findings included EKG showed atrial fibrillation with rapid ventricular sponsor 117.Portable chest ray showed cardiomegaly with mild central congestion with improved overall appearance compared to 08/12/2022 with patchy atelectasis at the left base.   Afib RVR- cardiology was consulted. Rate controlled medically and started on eliquis. Received TEE and was cardioverted on 11/10. Continued on metoprolol and eliquis at discharge with stable vital signs and asymptomatic.   COVID-19- completed paxlovid and steroids. He was weaned to room air this admission and asymptomatic. Had generalized weakness. PT/OT evaluated and recommended home health at dc.   MSSA bacteremia- initial blood cultures positive for MSSA. ID was consulted for  He was started on IV cefazolin. Repeat blood cultures negative. TEE negative for vegetations. Source was not identified. He was discharged with a midline and home health services to complete IV Abx until 11/18 and will then be transitioned to PO linezolid 11/19-12/3.  Discharge Condition: Good, improved Recommended discharge diet: Cardiac diet  Consultations: ID Cardiology   Procedures/Studies: TTE TEE Midline placed 11/11  Discharge Instructions     [facility] pharmacist to adjust dose for Vancomycin, Aminoglycosides and other anti-infective therapies as requested by physician.   Complete by: As directed    [facility] to provide Cath Flo 64m   Complete by: As directed    Administer for PICC line occlusion and as ordered by physician for other access device issues.   Anaphylaxis Kit:  Provided to treat any anaphylactic reaction to the medication being provided to the patient if First Dose or when requested by physician   Complete by: As directed    Epinephrine 67m/ml vial / amp: Administer 0.360m(0.33m28msubcutaneously once for moderate to  severe anaphylaxis, nurse to call physician and pharmacy when reaction occurs and call 911 if needed for immediate care   Diphenhydramine 79m55m IV vial: Administer 25-79mg52mIM PRN for first dose reaction, rash, itching, mild reaction, nurse to call physician and pharmacy when reaction occurs   Sodium Chloride 0.9% NS 500ml 15mAdminister if needed for hypovolemic blood pressure drop or as ordered by physician after call to physician with anaphylactic reaction   Change dressing on IV access line weekly and PRN   Complete by: As directed    Flush IV access with Sodium Chloride 0.9% and Heparin 10 units/ml or 100 units/ml   Complete by: As directed    Home infusion instructions - [facility]   Complete by: As directed    Instructions: Flush IV access with Sodium Chloride 0.9% and Heparin 10units/ml or 100units/ml   Change dressing on IV access line: Weekly and PRN   Instructions Cath Flo 2mg: A77mnister for PICC Line occlusion and as ordered by physician for other access device   [facility] pharmacist to adjust dose for: Vancomycin, Aminoglycosides and other anti-infective therapies as requested by physician   Method of administration may be changed at the discretion of home infusion pharmacist based upon assessment of the patient and/or caregiver's ability to self-administer the medication ordered   Complete by: As directed       Allergies as of 08/25/2022       Reactions   Peanut-containing Drug Products Anaphylaxis, Dermatitis   Yellow Jacket Venom [bee Venom] Anaphylaxis, Other (See Comments)   Respiratory Distress   Celebrex [celecoxib] Other (See Comments)   BLACK STOOL?MELENA?   Percocet [oxycodone-acetaminophen] Hives, Swelling, Other (See Comments)   Latex Rash   Nsaids Rash        Medication List     STOP taking these medications    nirmatrelvir/ritonavir EUA (renal dosing) 10 x 150 MG & 10 x 100MG Tabs Commonly known as: PAXLOVID        TAKE these medications    acetaminophen 500 MG tablet Commonly known as: TYLENOL Take 1,000 mg by mouth 3 (three) times daily.   Ajovy 225 MG/1.5ML Soaj Generic drug: Fremanezumab-vfrm Inject 225 mg into the skin every 30 (thirty) days.   amitriptyline 50 MG tablet Commonly known as: ELAVIL TAKE TWO TABLETS BY MOUTH EVERYDAY AT BEDTIME What changed: See the new instructions.   apixaban 5 MG Tabs tablet Commonly known as: ELIQUIS Take 1 tablet (5 mg total) by mouth 2 (two) times daily.   atorvastatin 20 MG tablet Commonly known as: LIPITOR TAKE ONE TABLET BY MOUTH ONCE DAILY   B-COMPLEX/B-12 PO Take 1 tablet by mouth daily.   ceFAZolin  IVPB Commonly known as: ANCEF Inject 2 g into the vein every 8 (eight) hours for 8 days. Indication: MSSA bacteremia First Dose: Yes Last Day of Therapy: 09/01/2022 Labs - Once weekly: CBC/D and BMP Method of administration: IV Push Method of administration may be changed at the discretion of home infusion pharmacist based upon assessment of the patient and/or caregiver's ability to self-administer the medication ordered. Midline Care Per Protocol: Home health RN for IV administration and teaching; Midline care and labs.   Please pull midline at completion of  IV antibiotics Fax weekly labs to [phone]   cetirizine 10 MG tablet Commonly known as: ZYRTEC Take 1 tablet (10 mg total) by mouth daily.   clotrimazole 1 % cream Commonly known as: LOTRIMIN Apply 1 application topically 2 (two) times daily. To groin What changed:  when to take this reasons to take this additional instructions   divalproex 500 MG 24 hr tablet Commonly known as: DEPAKOTE ER TAKE ONE TABLET BY MOUTH EVERYDAY AT BEDTIME   EPINEPHrine 0.3 mg/0.3 mL Soaj injection Commonly known as: EPI-PEN Inject 0.3 mg into the muscle as needed for anaphylaxis. (bee stings)   ferrous sulfate 325 (65 FE) MG EC tablet Take 325 mg by mouth daily with breakfast.    fluticasone 50 MCG/ACT nasal spray Commonly known as: FLONASE Place 2 sprays into both nostrils daily.   furosemide 20 MG tablet Commonly known as: LASIX TAKE ONE TABLET BY MOUTH EVERY MORNING   gabapentin 300 MG capsule Commonly known as: NEURONTIN TAKE ONE CAPSULE BY MOUTH EVERY MORNING and TAKE TWO CAPSULES BY MOUTH AT NOON and TAKE TWO CAPSULES BY MOUTH EVERY EVENING   ketoconazole 2 % shampoo Commonly known as: Nizoral Apply 1 application topically 3 (three) times a week.   levothyroxine 112 MCG tablet Commonly known as: SYNTHROID Take 1 tablet (112 mcg total) by mouth daily before breakfast.   linezolid 600 MG tablet Commonly known as: ZYVOX Take 1 tablet (600 mg total) by mouth 2 (two) times daily for 14 days. Start taking on: September 02, 2022   methocarbamol 500 MG tablet Commonly known as: ROBAXIN TAKE ONE TABLET BY MOUTH twice daily AS NEEDED FOR muscle SPASMS   metoprolol succinate 100 MG 24 hr tablet Commonly known as: TOPROL-XL TAKE ONE TABLET BY MOUTH EVERYDAY AT BEDTIME   nitroGLYCERIN 0.4 MG SL tablet Commonly known as: NITROSTAT Place 1 tablet (0.4 mg total) under the tongue every 5 (five) minutes as needed for chest pain.   nystatin 100000 UNIT/ML suspension Commonly known as: MYCOSTATIN Take 5 mLs (500,000 Units total) by mouth 4 (four) times daily.   ondansetron 4 MG disintegrating tablet Commonly known as: ZOFRAN-ODT TAKE ONE TABLET BY MOUTH EVERY 8 HOURS AS NEEDED   pantoprazole 40 MG tablet Commonly known as: PROTONIX Take 1 tablet (40 mg total) by mouth every other day.   tamsulosin 0.4 MG Caps capsule Commonly known as: FLOMAX Take 1 capsule (0.4 mg total) by mouth daily.   tiZANidine 4 MG tablet Commonly known as: ZANAFLEX TAKE ONE TABLET BY MOUTH every SIX hours AS NEEDED FOR acute HEADACHE   Ubrelvy 100 MG Tabs Generic drug: Ubrogepant Take 100 mg by mouth as needed (take 1 tablet at onset of headache, may repeat in hours, max  is 200 mg in 24 hours).   Vitamin D3 25 MCG (1000 UT) Caps Take 1 capsule (1,000 Units total) by mouth daily.               Discharge Care Instructions  (From admission, onward)           Start     Ordered   08/25/22 0000  Change dressing on IV access line weekly and PRN  (Home infusion instructions - [facility] )        08/25/22 0936            Subjective   Pt reports feeling overall improved. Denies SOB or chest pain. Continues to have generalized weakness but was able to walk short distances  with PT and lives with son who can help. He expresses understanding of his follow up medications and appointments.  Objective  Blood pressure 110/66, pulse 84, temperature 98.5 F (36.9 C), resp. rate 18, height _0  (1.727 m), weight 113.2 kg, SpO2 96 %.   General: Pt is alert, awake, not in acute distress Cardiovascular: RRR, S1/S2 +, no rubs, no gallops Respiratory: CTA bilaterally, no wheezing, no rhonchi Abdominal: Soft, NT, ND, bowel sounds + Extremities: no edema, no cyanosis  The results of significant diagnostics from this hospitalization (including imaging, microbiology, ancillary and laboratory) are listed below for reference.   Imaging studies: ECHO TEE  Result Date: 08/24/2022    TRANSESOPHOGEAL ECHO REPORT   Patient Name:   ANGELGABRIEL WILLMORE Date of Exam: 08/24/2022 Medical Rec #:  [id number]      Height:       68.0 in Accession #:    [id number]     Weight:       249.6 lb Date of Birth:  March 30, 1951       BSA:          2.246 m Patient Age:    23 years       BP:           98/68 mmHg Patient Gender: M              HR:           79 bpm. Exam Location:  [facility] Procedure: Transesophageal Echo, Cardiac Doppler, Color Doppler and Saline            Contrast Bubble Study Indications:     SBE ( Subacute bacterial endocarditis) I33.0  History:         Patient has prior history of Echocardiogram examinations, most                  recent 08/20/2022. CHF. Heart attack, PFO.   Sonographer:     Sherrie Sport Referring Phys:  [place] Diagnosing Phys: Ida Rogue MD PROCEDURE: After discussion of the risks and benefits of a TEE, an informed consent was obtained from the patient. TEE procedure time was 30 minutes. The transesophogeal probe was passed without difficulty through the esophogus of the patient. Local oropharyngeal anesthetic was provided with Cetacaine and viscous lidocaine. Sedation performed by different physician. The patient's vital signs; including heart rate, blood pressure, and oxygen saturation; remained stable throughout the procedure. The patient developed no complications during the procedure. A direct current cardioversion was performed at 150 joules with 1 attempt.  IMPRESSIONS  1. Left ventricular ejection fraction, by estimation, is 60 to 65%. The left ventricle has normal function. The left ventricle has no regional wall motion abnormalities.  2. Right ventricular systolic function is normal. The right ventricular size is normal.  3. No left atrial/left atrial appendage thrombus was detected.  4. The mitral valve is normal in structure. Mild to moderate mitral valve regurgitation. No evidence of mitral stenosis.  5. The aortic valve is normal in structure. Aortic valve regurgitation is mild. No aortic stenosis is present.  6. The inferior vena cava is normal in size with greater than 50% respiratory variability, suggesting right atrial pressure of 3 mmHg.  7. Agitated saline contrast bubble study was negative, with no evidence of any interatrial shunt  8. No valve vegetation noted. Conclusion(s)/Recommendation(s): Normal biventricular function without evidence of hemodynamically significant valvular heart disease. FINDINGS  Left Ventricle: Left ventricular ejection fraction, by estimation, is 60  to 65%. The left ventricle has normal function. The left ventricle has no regional wall motion abnormalities. The left ventricular internal cavity size was  normal in size. There is  no left ventricular hypertrophy. Right Ventricle: The right ventricular size is normal. No increase in right ventricular wall thickness. Right ventricular systolic function is normal. Left Atrium: Left atrial size was normal in size. No left atrial/left atrial appendage thrombus was detected. Right Atrium: Right atrial size was normal in size. Pericardium: There is no evidence of pericardial effusion. Mitral Valve: The mitral valve is normal in structure. Mild to moderate mitral valve regurgitation. No evidence of mitral valve stenosis. There is no evidence of mitral valve vegetation. Tricuspid Valve: The tricuspid valve is normal in structure. Tricuspid valve regurgitation is not demonstrated. No evidence of tricuspid stenosis. There is no evidence of tricuspid valve vegetation. Aortic Valve: The aortic valve is normal in structure. Aortic valve regurgitation is mild. No aortic stenosis is present. There is no evidence of aortic valve vegetation. Pulmonic Valve: The pulmonic valve was normal in structure. Pulmonic valve regurgitation is not visualized. No evidence of pulmonic stenosis. There is no evidence of pulmonic valve vegetation. Aorta: The aortic root is normal in size and structure. Venous: The inferior vena cava is normal in size with greater than 50% respiratory variability, suggesting right atrial pressure of 3 mmHg. IAS/Shunts: No atrial level shunt detected by color flow Doppler. Agitated saline contrast was given intravenously to evaluate for intracardiac shunting. Agitated saline contrast bubble study was negative, with no evidence of any interatrial shunt. Ida Rogue MD Electronically signed by Ida Rogue MD Signature Date/Time: 08/24/2022/3:43:10 PM    Final    ECHOCARDIOGRAM COMPLETE  Result Date: 08/20/2022    ECHOCARDIOGRAM REPORT   Patient Name:   DARIK MASSING Date of Exam: 08/20/2022 Medical Rec #:  [id number]      Height:       68.0 in Accession #:     [id number]     Weight:       235.0 lb Date of Birth:  11/14/1950       BSA:          2.189 m Patient Age:    36 years       BP:           121/79 mmHg Patient Gender: M              HR:           72 bpm. Exam Location:  [facility] Procedure: 2D Echo and Intracardiac Opacification Agent Indications:     Atrial Fibrillation I48.91  History:         Patient has prior history of Echocardiogram examinations, most                  recent 01/09/2018. CHF; CAD and Previous Myocardial Infarction.  Sonographer:     Mikki Santee RDCS Referring Phys:  [id number] [place] Diagnosing Phys: Kathlyn Sacramento MD IMPRESSIONS  1. Left ventricular ejection fraction, by estimation, is 55 to 60%. The left ventricle has normal function. Left ventricular endocardial border not optimally defined to evaluate regional wall motion. Left ventricular diastolic parameters are indeterminate.  2. Right ventricular systolic function is normal. The right ventricular size is normal.  3. Left atrial size was mildly dilated.  4. The mitral valve is normal in structure. Moderate mitral valve regurgitation. No evidence of mitral stenosis.  5. The aortic valve is normal in structure. Aortic  valve regurgitation is mild. No aortic stenosis is present.  6. Aortic dilatation noted. There is mild dilatation of the ascending aorta, measuring 42 mm. FINDINGS  Left Ventricle: Left ventricular ejection fraction, by estimation, is 55 to 60%. The left ventricle has normal function. Left ventricular endocardial border not optimally defined to evaluate regional wall motion. Definity contrast agent was given IV to delineate the left ventricular endocardial borders. The left ventricular internal cavity size was normal in size. There is no left ventricular hypertrophy. Left ventricular diastolic parameters are indeterminate. Right Ventricle: The right ventricular size is normal. No increase in right ventricular wall thickness. Right ventricular systolic function is normal. Left  Atrium: Left atrial size was mildly dilated. Right Atrium: Right atrial size was normal in size. Pericardium: There is no evidence of pericardial effusion. Mitral Valve: The mitral valve is normal in structure. Moderate mitral valve regurgitation. No evidence of mitral valve stenosis. Tricuspid Valve: The tricuspid valve is normal in structure. Tricuspid valve regurgitation is trivial. No evidence of tricuspid stenosis. Aortic Valve: The aortic valve is normal in structure. Aortic valve regurgitation is mild. No aortic stenosis is present. Pulmonic Valve: The pulmonic valve was normal in structure. Pulmonic valve regurgitation is not visualized. No evidence of pulmonic stenosis. Aorta: Aortic dilatation noted. There is mild dilatation of the ascending aorta, measuring 42 mm. Venous: The inferior vena cava was not well visualized. IAS/Shunts: No atrial level shunt detected by color flow Doppler.  LEFT VENTRICLE PLAX 2D LVIDd:         5.20 cm LVIDs:         3.70 cm LV PW:         1.00 cm LV IVS:        0.90 cm LVOT diam:     2.50 cm LV SV:         67 LV SV Index:   30 LVOT Area:     4.91 cm  RIGHT VENTRICLE RV Basal diam:  3.00 cm RV Mid diam:    3.10 cm RV S prime:     11.20 cm/s TAPSE (M-mode): 1.6 cm LEFT ATRIUM             Index        RIGHT ATRIUM           Index LA diam:        4.20 cm 1.92 cm/m   RA Area:     10.70 cm LA Vol (A2C):   60.1 ml 27.46 ml/m  RA Volume:   20.40 ml  9.32 ml/m LA Vol (A4C):   48.3 ml 22.07 ml/m LA Biplane Vol: 57.5 ml 26.27 ml/m  AORTIC VALVE LVOT Vmax:   70.35 cm/s LVOT Vmean:  44.950 cm/s LVOT VTI:    0.136 m  AORTA Ao Root diam: 3.80 cm MR Peak grad: 102.8 mmHg  TRICUSPID VALVE MR Vmax:      507.00 cm/s TR Peak grad:   18.8 mmHg                           TR Vmax:        217.00 cm/s                            SHUNTS                           Systemic  VTI:  0.14 m                           Systemic Diam: 2.50 cm Kathlyn Sacramento MD Electronically signed by Kathlyn Sacramento MD  Signature Date/Time: 08/20/2022/12:45:25 PM    Final    CT Angio Chest Pulmonary Embolism (PE) W or WO Contrast  Result Date: 08/17/2022 CLINICAL DATA:  Right-sided chest pain. Bruising along the left side of the chest. Hypotension. EXAM: CT ANGIOGRAPHY CHEST WITH CONTRAST TECHNIQUE: Multidetector CT imaging of the chest was performed using the standard protocol during bolus administration of intravenous contrast. Multiplanar CT image reconstructions and MIPs were obtained to evaluate the vascular anatomy. RADIATION DOSE REDUCTION: This exam was performed according to the departmental dose-optimization program which includes automated exposure control, adjustment of the mA and/or kV according to patient size and/or use of iterative reconstruction technique. CONTRAST:  16m OMNIPAQUE IOHEXOL 350 MG/ML SOLN COMPARISON:  Chest radiograph 08/16/2022 and chest CT 11/01/2016 FINDINGS: Despite efforts by the technologist and patient, motion artifact is present on today's exam and could not be eliminated. This reduces exam sensitivity and specificity. Cardiovascular: No filling defect is identified in the pulmonary arterial tree to suggest pulmonary embolus. Atherosclerotic calcification of the aortic arch. Mild cardiomegaly. Suspected left anterior descending coronary artery atherosclerotic calcification. Poor opacification of the systemic vasculature including the aorta. Mediastinum/Nodes: Unremarkable Lungs/Pleura: Nonspecific ground-glass opacities favoring the lung bases potentially reflecting edema or air trapping. More confluent airspace opacity in the right lower lobe, pneumonia not excluded. There is airway thickening in the right lower lobe and peripheral atelectasis in the posterior basal segment left lower lobe. Upper Abdomen: Fluid density Bosniak category 1 cyst of the right mid kidney medially, requiring no follow up. Hypodense lesion of the right kidney upper pole anteriorly on image 143 series 4 has an  internal density of 16 Hounsfield units, favoring a benign Bosniak category 2 cyst. No further imaging workup of this lesion is indicated. Musculoskeletal: Bilateral total shoulder arthroplasties. Thoracic spondylosis. Review of the MIP images confirms the above findings. IMPRESSION: 1. No filling defect is identified in the pulmonary arterial tree to suggest pulmonary embolus. 2. Mild cardiomegaly with suspected left anterior descending coronary artery atherosclerotic calcification. 3. Nonspecific ground-glass opacities favoring the lung bases probably reflect mild pulmonary edema. 4. More confluent airspace opacity in the right lower lobe, pneumonia not excluded. 5. Airway thickening in the right lower lobe, query bronchitis or reactive airways disease. 6. Aortic atherosclerosis. Aortic Atherosclerosis (ICD10-I70.0). Electronically Signed   By: WVan ClinesM.D.   On: 08/17/2022 15:34   DG Chest Portable 1 View  Result Date: 08/16/2022 CLINICAL DATA:  Shortness of breath EXAM: PORTABLE CHEST 1 VIEW COMPARISON:  08/12/2022 FINDINGS: Bilateral shoulder replacements. Cardiomegaly with central congestion. Patchy atelectasis left base. No pleural effusion or pneumothorax. IMPRESSION: Cardiomegaly with mild central congestion, overall appearance improved since 08/12/2022. Patchy atelectasis left base. Electronically Signed   By: KDonavan FoilM.D.   On: 08/16/2022 22:26   CT Head Wo Contrast  Result Date: 08/12/2022 CLINICAL DATA:  71year old male with history of trauma from a fall 1 week ago. EXAM: CT HEAD WITHOUT CONTRAST CT CERVICAL SPINE WITHOUT CONTRAST TECHNIQUE: Multidetector CT imaging of the head and cervical spine was performed following the standard protocol without intravenous contrast. Multiplanar CT image reconstructions of the cervical spine were also generated. RADIATION DOSE REDUCTION: This exam was performed according to the departmental dose-optimization program which includes  automated exposure control, adjustment of the mA and/or kV according to patient size and/or use of iterative reconstruction technique. COMPARISON:  Head and cervical spine CT 07/02/2022. FINDINGS: CT HEAD FINDINGS Brain: No evidence of acute infarction, hemorrhage, hydrocephalus, extra-axial collection or mass lesion/mass effect. Vascular: No hyperdense vessel or unexpected calcification. Skull: Normal. Negative for fracture or focal lesion. Sinuses/Orbits: No acute finding. Other: None. CT CERVICAL SPINE FINDINGS Alignment: Normal. Skull base and vertebrae: No acute fracture. No primary bone lesion or focal pathologic process. Soft tissues and spinal canal: No prevertebral fluid or swelling. No visible canal hematoma. Disc levels: Multilevel degenerative disc disease, most pronounced at C4-C5, C5-C6 and C6-C7. Severe multilevel facet arthropathy bilaterally. Upper chest: Negative. Other: None. IMPRESSION: 1. No evidence of significant acute traumatic injury to the skull, brain or cervical spine. 2. The appearance of the brain is normal. 3. Severe multilevel degenerative disc disease and cervical spondylosis, as above. Electronically Signed   By: Vinnie Langton M.D.   On: 08/12/2022 09:55   CT Cervical Spine Wo Contrast  Result Date: 08/12/2022 CLINICAL DATA:  71 year old male with history of trauma from a fall 1 week ago. EXAM: CT HEAD WITHOUT CONTRAST CT CERVICAL SPINE WITHOUT CONTRAST TECHNIQUE: Multidetector CT imaging of the head and cervical spine was performed following the standard protocol without intravenous contrast. Multiplanar CT image reconstructions of the cervical spine were also generated. RADIATION DOSE REDUCTION: This exam was performed according to the departmental dose-optimization program which includes automated exposure control, adjustment of the mA and/or kV according to patient size and/or use of iterative reconstruction technique. COMPARISON:  Head and cervical spine CT 07/02/2022.  FINDINGS: CT HEAD FINDINGS Brain: No evidence of acute infarction, hemorrhage, hydrocephalus, extra-axial collection or mass lesion/mass effect. Vascular: No hyperdense vessel or unexpected calcification. Skull: Normal. Negative for fracture or focal lesion. Sinuses/Orbits: No acute finding. Other: None. CT CERVICAL SPINE FINDINGS Alignment: Normal. Skull base and vertebrae: No acute fracture. No primary bone lesion or focal pathologic process. Soft tissues and spinal canal: No prevertebral fluid or swelling. No visible canal hematoma. Disc levels: Multilevel degenerative disc disease, most pronounced at C4-C5, C5-C6 and C6-C7. Severe multilevel facet arthropathy bilaterally. Upper chest: Negative. Other: None. IMPRESSION: 1. No evidence of significant acute traumatic injury to the skull, brain or cervical spine. 2. The appearance of the brain is normal. 3. Severe multilevel degenerative disc disease and cervical spondylosis, as above. Electronically Signed   By: Vinnie Langton M.D.   On: 08/12/2022 09:55   DG Chest Portable 1 View  Result Date: 08/12/2022 CLINICAL DATA:  71 year old male with history of weakness. EXAM: PORTABLE CHEST 1 VIEW COMPARISON:  Chest x-ray 07/02/2022. FINDINGS: Lung volumes are low. Bibasilar opacities which may reflect areas of atelectasis and/or consolidation, likely with superimposed small bilateral pleural effusions. No pneumothorax. Cephalization of the pulmonary vasculature. Heart size appears mildly enlarged. Upper mediastinal contours are distorted by patient's rotation to the right. Status post bilateral shoulder arthroplasty. IMPRESSION: 1. The appearance of the chest could suggest very mild congestive heart failure, as above. Electronically Signed   By: Vinnie Langton M.D.   On: 08/12/2022 07:46    Labs: Basic Metabolic Panel: Recent Labs  Lab 08/19/22 0616 08/20/22 0542 08/21/22 0804 08/22/22 0731 08/23/22 0546 08/24/22 0651  NA 137 137 136 136 138 135  K  4.0 3.9 4.0 4.9 5.2* 4.2  CL 103 105 104 104 104 104  CO2 _0 GLUCOSE 178* 156* 132* 105*  102* 93  BUN 27* 37* 35* 30* 28* 22  CREATININE 0.90 0.97 0.91 1.00 1.02 1.00  CALCIUM 7.8* 7.8* 7.7* 7.9* 7.5* 7.5*  MG 2.2  --   --   --   --  2.2   CBC: Recent Labs  Lab 08/19/22 0616 08/20/22 0542 08/21/22 0804 08/22/22 0731 08/24/22 0651  WBC 8.1 10.6* 14.7* 12.7* 14.6*  NEUTROABS 6.9 8.2*  --   --   --   HGB 13.9 13.6 14.0 15.1 13.9  HCT 41.1 39.4 41.1 44.8 40.7  MCV 93.4 91.8 91.5 94.3 94.9  PLT 209 214 207 134* 176   Microbiology: Results for orders placed or performed during the hospital encounter of 08/16/22  Blood culture (routine x 2)     Status: Abnormal   Collection Time: 08/16/22 10:02 PM   Specimen: BLOOD  Result Value Ref Range Status   Specimen Description   Final    BLOOD LEFT HAND Performed at [facility] [address] [place], [place] [address]    Special Requests   Final    BOTTLES DRAWN AEROBIC AND ANAEROBIC Blood Culture results may not be optimal due to an inadequate volume of blood received in culture bottles Performed at [facility], [address] [place], [place] [address]    Culture  Setup Time   Final    GRAM POSITIVE COCCI ANAEROBIC BOTTLE ONLY GRAM STAIN REVIEWED-AGREE WITH RESULT CRITICAL RESULT CALLED TO, READ BACK BY AND VERIFIED WITH: NATHAN BELUE AT 2323 08/17/2022 GA Performed at [facility], [address] [place], [place] [address]    Culture (A)  Final    STAPHYLOCOCCUS AUREUS SUSCEPTIBILITIES PERFORMED ON PREVIOUS CULTURE WITHIN THE LAST 5 DAYS. Performed at [facility], [place] [address], [place], [place] [address]    Report Status 08/20/2022 FINAL  Final  Blood culture (routine x 2)     Status: Abnormal   Collection Time: 08/17/22  2:54 AM   Specimen: BLOOD RIGHT ARM  Result Value Ref Range Status   Specimen Description   Final    BLOOD RIGHT ARM Performed at [facility], [address], [place], [place] [address]    Special Requests   Final    BOTTLES DRAWN AEROBIC AND ANAEROBIC Blood Culture results may not be optimal due to an inadequate volume of blood received in culture bottles Performed at [facility], [address] [place], [place] [address]    Culture  Setup Time   Final    GRAM POSITIVE COCCI AEROBIC BOTTLE ONLY CRITICAL RESULT CALLED TO, READ BACK BY AND VERIFIED WITH: PHARMD BRANDON BEERS AT [place] 08/17/2022 GAA Performed at [facility], [place] [address] [place], [place] [address]    Culture STAPHYLOCOCCUS AUREUS (A)  Final   Report Status 08/20/2022 FINAL  Final   Organism ID, Bacteria STAPHYLOCOCCUS AUREUS  Final      Susceptibility   Staphylococcus aureus - MIC*    CIPROFLOXACIN <=0.5 SENSITIVE Sensitive     ERYTHROMYCIN <=0.25 SENSITIVE Sensitive     GENTAMICIN <=0.5 SENSITIVE Sensitive     OXACILLIN 0.5 SENSITIVE Sensitive     TETRACYCLINE <=1 SENSITIVE Sensitive     VANCOMYCIN <=0.5 SENSITIVE Sensitive     TRIMETH/SULFA <=10 SENSITIVE Sensitive     CLINDAMYCIN <=0.25 SENSITIVE Sensitive     RIFAMPIN <=0.5 SENSITIVE Sensitive     Inducible Clindamycin NEGATIVE Sensitive     * STAPHYLOCOCCUS AUREUS  Blood Culture ID Panel (  Reflexed)     Status: Abnormal   Collection Time: 08/17/22  2:54 AM  Result Value Ref Range Status   Enterococcus faecalis NOT DETECTED NOT DETECTED Final   Enterococcus Faecium NOT DETECTED NOT DETECTED Final   Listeria monocytogenes NOT DETECTED NOT DETECTED Final   Staphylococcus species DETECTED (A) NOT DETECTED Final    Comment: CRITICAL RESULT CALLED TO, READ BACK BY AND VERIFIED WITH: PHARMD BRANDON BEERS AT 1930 08/17/2022 GAA    Staphylococcus aureus (BCID) DETECTED (A) NOT DETECTED Final    Comment: CRITICAL RESULT CALLED TO, READ BACK BY AND VERIFIED WITH: PHARMD BRANDON BEERS AT 1930 08/17/2022 GAA    Staphylococcus epidermidis NOT DETECTED NOT DETECTED Final    Staphylococcus lugdunensis NOT DETECTED NOT DETECTED Final   Streptococcus species NOT DETECTED NOT DETECTED Final   Streptococcus agalactiae NOT DETECTED NOT DETECTED Final   Streptococcus pneumoniae NOT DETECTED NOT DETECTED Final   Streptococcus pyogenes NOT DETECTED NOT DETECTED Final   A.calcoaceticus-baumannii NOT DETECTED NOT DETECTED Final   Bacteroides fragilis NOT DETECTED NOT DETECTED Final   Enterobacterales NOT DETECTED NOT DETECTED Final   Enterobacter cloacae complex NOT DETECTED NOT DETECTED Final   Escherichia coli NOT DETECTED NOT DETECTED Final   Klebsiella aerogenes NOT DETECTED NOT DETECTED Final   Klebsiella oxytoca NOT DETECTED NOT DETECTED Final   Klebsiella pneumoniae NOT DETECTED NOT DETECTED Final   Proteus species NOT DETECTED NOT DETECTED Final   Salmonella species NOT DETECTED NOT DETECTED Final   Serratia marcescens NOT DETECTED NOT DETECTED Final   Haemophilus influenzae NOT DETECTED NOT DETECTED Final   Neisseria meningitidis NOT DETECTED NOT DETECTED Final   Pseudomonas aeruginosa NOT DETECTED NOT DETECTED Final   Stenotrophomonas maltophilia NOT DETECTED NOT DETECTED Final   Candida albicans NOT DETECTED NOT DETECTED Final   Candida auris NOT DETECTED NOT DETECTED Final   Candida glabrata NOT DETECTED NOT DETECTED Final   Candida krusei NOT DETECTED NOT DETECTED Final   Candida parapsilosis NOT DETECTED NOT DETECTED Final   Candida tropicalis NOT DETECTED NOT DETECTED Final   Cryptococcus neoformans/gattii NOT DETECTED NOT DETECTED Final   Meth resistant mecA/C and MREJ NOT DETECTED NOT DETECTED Final    Comment: Performed at [facility], [address] [place], [place] [address]  Culture, blood (Routine X 2) w Reflex to ID Panel     Status: None   Collection Time: 08/19/22  6:16 AM   Specimen: BLOOD  Result Value Ref Range Status   Specimen Description BLOOD BLOOD LEFT ARM  Final   Special Requests   Final    BOTTLES DRAWN AEROBIC AND  ANAEROBIC Blood Culture adequate volume   Culture   Final    NO GROWTH 5 DAYS Performed at [facility], [address] [place], [place] [address]    Report Status 08/24/2022 FINAL  Final  Culture, blood (Routine X 2) w Reflex to ID Panel     Status: None   Collection Time: 08/19/22  6:18 AM   Specimen: BLOOD  Result Value Ref Range Status   Specimen Description BLOOD BLOOD RIGHT HAND  Final   Special Requests   Final    BOTTLES DRAWN AEROBIC AND ANAEROBIC Blood Culture adequate volume   Culture   Final    NO GROWTH 5 DAYS Performed at [facility], [address], [place], [place] [address]    Report Status 08/24/2022 FINAL  Final  Surgical PCR screen     Status: Abnormal  Collection Time: 08/20/22 10:04 AM   Specimen: Nasal Mucosa; Nasal Swab  Result Value Ref Range Status   MRSA, PCR NEGATIVE NEGATIVE Final   Staphylococcus aureus POSITIVE (A) NEGATIVE Final    Comment: (NOTE) The Xpert SA Assay (FDA approved for NASAL specimens in patients 23 years of age and older), is one component of a comprehensive surveillance program. It is not intended to diagnose infection nor to guide or monitor treatment. Performed at [facility], [address], [place], [place] [address]     Time coordinating discharge: Over 30 minutes  Richarda Osmond, MD  [facility] 08/25/2022, 3:00 PM

## 2022-08-26 ENCOUNTER — Inpatient Hospital Stay
Admission: EM | Admit: 2022-08-26 | Discharge: 2022-08-28 | DRG: 872 | Disposition: A | Discharge status: Skilled Nursing Facility | Payer: Medicare Other | Attending: Internal Medicine | Admitting: Internal Medicine

## 2022-08-26 DIAGNOSIS — Z91010 Allergy to peanuts: Secondary | ICD-10-CM

## 2022-08-26 DIAGNOSIS — G8929 Other chronic pain: Secondary | ICD-10-CM | POA: Diagnosis present

## 2022-08-26 DIAGNOSIS — Z91040 Latex allergy status: Secondary | ICD-10-CM

## 2022-08-26 DIAGNOSIS — K760 Fatty (change of) liver, not elsewhere classified: Secondary | ICD-10-CM | POA: Diagnosis not present

## 2022-08-26 DIAGNOSIS — I4819 Other persistent atrial fibrillation: Secondary | ICD-10-CM | POA: Diagnosis not present

## 2022-08-26 DIAGNOSIS — Z886 Allergy status to analgesic agent status: Secondary | ICD-10-CM | POA: Diagnosis not present

## 2022-08-26 DIAGNOSIS — G44229 Chronic tension-type headache, not intractable: Secondary | ICD-10-CM | POA: Diagnosis not present

## 2022-08-26 DIAGNOSIS — E039 Hypothyroidism, unspecified: Secondary | ICD-10-CM | POA: Diagnosis present

## 2022-08-26 DIAGNOSIS — J45909 Unspecified asthma, uncomplicated: Secondary | ICD-10-CM | POA: Diagnosis not present

## 2022-08-26 DIAGNOSIS — K219 Gastro-esophageal reflux disease without esophagitis: Secondary | ICD-10-CM | POA: Diagnosis present

## 2022-08-26 DIAGNOSIS — Z96653 Presence of artificial knee joint, bilateral: Secondary | ICD-10-CM | POA: Diagnosis present

## 2022-08-26 DIAGNOSIS — R7881 Bacteremia: Secondary | ICD-10-CM | POA: Diagnosis not present

## 2022-08-26 DIAGNOSIS — G40909 Epilepsy, unspecified, not intractable, without status epilepticus: Secondary | ICD-10-CM

## 2022-08-26 DIAGNOSIS — Z8249 Family history of ischemic heart disease and other diseases of the circulatory system: Secondary | ICD-10-CM

## 2022-08-26 DIAGNOSIS — Z96612 Presence of left artificial shoulder joint: Secondary | ICD-10-CM | POA: Diagnosis not present

## 2022-08-26 DIAGNOSIS — Z8673 Personal history of transient ischemic attack (TIA), and cerebral infarction without residual deficits: Secondary | ICD-10-CM | POA: Diagnosis not present

## 2022-08-26 DIAGNOSIS — Z885 Allergy status to narcotic agent status: Secondary | ICD-10-CM

## 2022-08-26 DIAGNOSIS — Z7901 Long term (current) use of anticoagulants: Secondary | ICD-10-CM | POA: Diagnosis not present

## 2022-08-26 DIAGNOSIS — Z8616 Personal history of COVID-19: Secondary | ICD-10-CM | POA: Insufficient documentation

## 2022-08-26 DIAGNOSIS — Z86010 Personal history of colonic polyps: Secondary | ICD-10-CM | POA: Diagnosis not present

## 2022-08-26 DIAGNOSIS — B9561 Methicillin susceptible Staphylococcus aureus infection as the cause of diseases classified elsewhere: Secondary | ICD-10-CM | POA: Diagnosis not present

## 2022-08-26 DIAGNOSIS — N183 Chronic kidney disease, stage 3 unspecified: Secondary | ICD-10-CM | POA: Diagnosis not present

## 2022-08-26 DIAGNOSIS — Z96611 Presence of right artificial shoulder joint: Secondary | ICD-10-CM | POA: Diagnosis present

## 2022-08-26 DIAGNOSIS — Z803 Family history of malignant neoplasm of breast: Secondary | ICD-10-CM | POA: Diagnosis not present

## 2022-08-26 DIAGNOSIS — M1990 Unspecified osteoarthritis, unspecified site: Secondary | ICD-10-CM | POA: Diagnosis not present

## 2022-08-26 DIAGNOSIS — L97523 Non-pressure chronic ulcer of other part of left foot with necrosis of muscle: Secondary | ICD-10-CM | POA: Diagnosis not present

## 2022-08-26 DIAGNOSIS — R0602 Shortness of breath: Secondary | ICD-10-CM | POA: Diagnosis not present

## 2022-08-26 DIAGNOSIS — Z7401 Bed confinement status: Secondary | ICD-10-CM | POA: Diagnosis not present

## 2022-08-26 DIAGNOSIS — I5032 Chronic diastolic (congestive) heart failure: Secondary | ICD-10-CM | POA: Diagnosis present

## 2022-08-26 DIAGNOSIS — M6281 Muscle weakness (generalized): Secondary | ICD-10-CM | POA: Diagnosis not present

## 2022-08-26 DIAGNOSIS — R0689 Other abnormalities of breathing: Secondary | ICD-10-CM | POA: Diagnosis not present

## 2022-08-26 DIAGNOSIS — Z833 Family history of diabetes mellitus: Secondary | ICD-10-CM

## 2022-08-26 DIAGNOSIS — R262 Difficulty in walking, not elsewhere classified: Secondary | ICD-10-CM | POA: Diagnosis not present

## 2022-08-26 DIAGNOSIS — Z823 Family history of stroke: Secondary | ICD-10-CM | POA: Diagnosis not present

## 2022-08-26 DIAGNOSIS — M170 Bilateral primary osteoarthritis of knee: Secondary | ICD-10-CM | POA: Diagnosis not present

## 2022-08-26 DIAGNOSIS — R519 Headache, unspecified: Secondary | ICD-10-CM | POA: Diagnosis present

## 2022-08-26 DIAGNOSIS — M19011 Primary osteoarthritis, right shoulder: Secondary | ICD-10-CM | POA: Diagnosis not present

## 2022-08-26 DIAGNOSIS — E8809 Other disorders of plasma-protein metabolism, not elsewhere classified: Secondary | ICD-10-CM | POA: Diagnosis not present

## 2022-08-26 DIAGNOSIS — Z91038 Other insect allergy status: Secondary | ICD-10-CM | POA: Diagnosis not present

## 2022-08-26 DIAGNOSIS — Z79899 Other long term (current) drug therapy: Secondary | ICD-10-CM

## 2022-08-26 DIAGNOSIS — I339 Acute and subacute endocarditis, unspecified: Secondary | ICD-10-CM | POA: Diagnosis not present

## 2022-08-26 DIAGNOSIS — Q2112 Patent foramen ovale: Secondary | ICD-10-CM | POA: Diagnosis not present

## 2022-08-26 HISTORY — DX: Personal history of COVID-19: Z86.16

## 2022-08-26 MED ORDER — FUROSEMIDE 20 MG PO TABS
20.0000 mg | ORAL_TABLET | Freq: Every morning | ORAL | Status: DC
  Administered 2022-08-27 – 2022-08-28 (×2): 20 mg via ORAL
  Filled 2022-08-26 (×2): qty 1

## 2022-08-26 MED ORDER — ONDANSETRON HCL 4 MG PO TABS: 4.0000 mg | ORAL_TABLET | Freq: Four times a day (QID) | ORAL | Status: DC | PRN

## 2022-08-26 MED ORDER — METHOCARBAMOL 500 MG PO TABS
500.0000 mg | ORAL_TABLET | Freq: Three times a day (TID) | ORAL | Status: DC | PRN
  Administered 2022-08-27 – 2022-08-28 (×3): 500 mg via ORAL
  Filled 2022-08-26 (×4): qty 1

## 2022-08-26 MED ORDER — ACETAMINOPHEN 325 MG PO TABS
650.0000 mg | ORAL_TABLET | Freq: Four times a day (QID) | ORAL | Status: DC | PRN
  Administered 2022-08-27 – 2022-08-28 (×3): 650 mg via ORAL
  Filled 2022-08-26 (×3): qty 2

## 2022-08-26 MED ORDER — TAMSULOSIN HCL 0.4 MG PO CAPS
0.4000 mg | ORAL_CAPSULE | Freq: Every day | ORAL | Status: DC
  Administered 2022-08-27 – 2022-08-28 (×2): 0.4 mg via ORAL
  Filled 2022-08-26 (×2): qty 1

## 2022-08-26 MED ORDER — ENOXAPARIN SODIUM 40 MG/0.4ML IJ SOSY: 40.0000 mg | PREFILLED_SYRINGE | INTRAMUSCULAR | Status: DC

## 2022-08-26 MED ORDER — NITROGLYCERIN 0.4 MG SL SUBL: 0.4000 mg | SUBLINGUAL_TABLET | SUBLINGUAL | Status: DC | PRN

## 2022-08-26 MED ORDER — ATORVASTATIN CALCIUM 20 MG PO TABS
20.0000 mg | ORAL_TABLET | Freq: Every day | ORAL | Status: DC
  Administered 2022-08-27 – 2022-08-28 (×2): 20 mg via ORAL
  Filled 2022-08-26 (×2): qty 1

## 2022-08-26 MED ORDER — PANTOPRAZOLE SODIUM 40 MG PO TBEC
40.0000 mg | DELAYED_RELEASE_TABLET | ORAL | Status: DC
  Administered 2022-08-27: 40 mg via ORAL
  Filled 2022-08-26: qty 1

## 2022-08-26 MED ORDER — ACETAMINOPHEN 650 MG RE SUPP: 650.0000 mg | Freq: Four times a day (QID) | RECTAL | Status: DC | PRN

## 2022-08-26 MED ORDER — APIXABAN 5 MG PO TABS
5.0000 mg | ORAL_TABLET | Freq: Two times a day (BID) | ORAL | Status: DC
  Administered 2022-08-26 – 2022-08-28 (×4): 5 mg via ORAL
  Filled 2022-08-26 (×4): qty 1

## 2022-08-26 MED ORDER — LINEZOLID 600 MG PO TABS: 600.0000 mg | ORAL_TABLET | Freq: Two times a day (BID) | ORAL | Status: DC

## 2022-08-26 MED ORDER — GABAPENTIN 100 MG PO CAPS
100.0000 mg | ORAL_CAPSULE | Freq: Three times a day (TID) | ORAL | Status: DC
  Administered 2022-08-26 – 2022-08-28 (×5): 100 mg via ORAL
  Filled 2022-08-26 (×5): qty 1

## 2022-08-26 MED ORDER — LEVOTHYROXINE SODIUM 112 MCG PO TABS
112.0000 ug | ORAL_TABLET | Freq: Every day | ORAL | Status: DC
  Administered 2022-08-27 – 2022-08-28 (×2): 112 ug via ORAL
  Filled 2022-08-26 (×2): qty 1

## 2022-08-26 MED ORDER — CEFAZOLIN SODIUM-DEXTROSE 2-4 GM/100ML-% IV SOLN
2.0000 g | Freq: Three times a day (TID) | INTRAVENOUS | Status: DC
  Administered 2022-08-26 – 2022-08-28 (×5): 2 g via INTRAVENOUS
  Filled 2022-08-26 (×6): qty 100

## 2022-08-26 MED ORDER — ONDANSETRON HCL 4 MG/2ML IJ SOLN: 4.0000 mg | Freq: Four times a day (QID) | INTRAMUSCULAR | Status: DC | PRN

## 2022-08-26 MED ORDER — DIVALPROEX SODIUM ER 500 MG PO TB24
500.0000 mg | ORAL_TABLET | Freq: Every day | ORAL | Status: DC
  Administered 2022-08-27 – 2022-08-28 (×2): 500 mg via ORAL
  Filled 2022-08-26: qty 1
  Filled 2022-08-26: qty 2

## 2022-08-26 MED ORDER — AMITRIPTYLINE HCL 100 MG PO TABS
100.0000 mg | ORAL_TABLET | Freq: Every day | ORAL | Status: DC
  Administered 2022-08-26 – 2022-08-27 (×2): 100 mg via ORAL
  Filled 2022-08-26: qty 2
  Filled 2022-08-26: qty 1

## 2022-08-26 MED ORDER — METOPROLOL SUCCINATE ER 100 MG PO TB24
100.0000 mg | ORAL_TABLET | Freq: Every day | ORAL | Status: DC
  Administered 2022-08-27 – 2022-08-28 (×2): 100 mg via ORAL
  Filled 2022-08-26: qty 2
  Filled 2022-08-26: qty 1

## 2022-08-26 NOTE — Assessment & Plan Note (Signed)
Continue Depakote and Neurontin

## 2022-08-26 NOTE — H&P (Signed)
History and Physical    Patient: Nicholas [name] QMV:[id number] DOB: [birth year] DOA: 08/26/2022 DOS: the patient was seen and examined on 08/26/2022 PCP: [name], MD  Patient coming from: Home  Chief Complaint:  Chief Complaint  Patient presents with  . Unable to Admin Abx    HPI: [name] is a 71 y.o. male with medical history significant for Diastolic CHF, hypothyroidism, , seizure disorder, Afib on Eliquis s/p cardioversion 08/24/2022, recent COVID-19 08/12/2022 (completed Paxlovid on 11/6), recently hospitalized from 11/2 to 11/11 for MSSA bacteremia after presenting with weakness and falls, discharged 1 day ago on cefazolin IV 2 g Q8 through 09/01/2022 followed by linezolid 600 p.o. twice daily 11/19 to 12/3 who was sent back to the ED due to difficulty self administering his IV cefazolin following his discharge.  He has received 1 dose of the cefazolin since discharge.  Family called the discharging physician and he was instructed to return.  He denies any new complaints. ED course and data review: Vitals within normal limits.  No labs EKG or imaging done due to recent discharge the day prior. Labs from 11/10 reviewed and showed WBC of 14,600 with otherwise normal BMP and CBC.  EKG on 11/10 showed sinus at 56 with nonspecific ST-T wave changes. Hospitalist consulted for admission.   Review of Systems: As mentioned in the history of present illness. All other systems reviewed and are negative.  Past Medical History:  Diagnosis Date  . Acute kidney failure 08/2008   "cleared up"  no problems since  . Allergy   . Anxiety   . Arthritis   . Asthma    ?of this no inhaler  . Blood transfusion   . CHF (congestive heart failure) ([place])    ?of this, pt denies  . CLOSTRIDIUM DIFFICILE COLITIS 07/04/2010   Annotation: 12/09, 2/10 Qualifier: Diagnosis of  By: [name], [name]    . Depression with anxiety   . Duodenitis determined by biopsy 02/2016   peptic likely due to  aleve (erosive gastropathy with duodenal erosions)  . ECZEMA 07/04/2010   Qualifier: Diagnosis of  By: [name], [name]    . Elevated liver enzymes   . Fatty liver   . Full dentures   . GERD (gastroesophageal reflux disease)   . Heart attack ([place])    08/2008 (likely demand ischemia in the setting of MSSA sepsis/R TKA  infection)10-2008  . History of hiatal hernia   . History of stomach ulcers   . Hypothyroidism   . Lower GI bleeding   . MALAR AND MAXILLARY BONES CLOSED FRACTURE 07/04/2010   Annotation: ORIF Qualifier: Diagnosis of  By: [name], [name]    . Migraine    "definitely"  . MRSA (methicillin resistant Staphylococcus aureus)    in leg, had to place steel rod in leg  . Personal history of colonic adenoma 06/01/2003  . PFO (patent foramen ovale)    small PFO by 08/2008 TEE  . Pneumonia 08/2008   "while in ICU"  . Seizures ([place]) 2009   "long time ago"    . Sleep apnea    does not wear CPAP  . Stroke [facility]) 2010   unable to complete sentences at times  . Wears glasses    Past Surgical History:  Procedure Laterality Date  . AMPUTATION Left 01/24/2022   Procedure: LEFT GREAT TOE AMPUTATION AT METATARSOPHALANGEAL JOINT AND SECOND TOE AMPUTATION;  Surgeon: Newt Minion, MD;  Location: [place];  Service: Orthopedics;  Laterality: Left;  . anterior  nerve transposition  07/2009   left ulnar nerve  . antibiotic spacer exchange  11/2008; 08/2006   right knee  . ARTHROTOMY  08/2008   right knee w/I&D  . CARDIOVASCULAR STRESS TEST  2013   stress EKG - negative for ischemia, 4 min 7.3 METs, normal blood pressure response  . CATARACT EXTRACTION    . COLONOSCOPY  11/2012   diverticulosis, rpt 10 yrs [name])  . ESOPHAGOGASTRODUODENOSCOPY  02/2016   erosive gastropathy with duodenal erosions [name])  . HARDWARE REMOVAL  04/2006   right knee w/antibiotic spacers placed  . INGUINAL HERNIA REPAIR  early 1990's   bilateral  . JOINT REPLACEMENT Bilateral   . KNEE ARTHROSCOPY   10/2001   right  . KNEE FUSION  03/2009   right knee removal; antibiotic spacers;   . LEFT HEART CATH AND CORONARY ANGIOGRAPHY N/A 02/04/2018    WNL (Patwardhan, Reynold Bowen, MD)  . LUMBAR LAMINECTOMY/DECOMPRESSION MICRODISCECTOMY N/A 12/05/2018   Procedure: LUMBAR THREE TO FOUR AND LUMBAR FOUR TO FIVE DECOMPRESSION;  Surgeon: [name], MD;  Location: [place];  Service: Orthopedics;  Laterality: N/A;  . MULTIPLE TOOTH EXTRACTIONS    . REPLACEMENT TOTAL KNEE  08/2008; 09/2001   right  . REVERSE SHOULDER ARTHROPLASTY Left 09/03/2017   Procedure: REVERSE SHOULDER ARTHROPLASTY;  Surgeon: Meredith Pel, MD;  Location: [place];  Service: Orthopedics;  Laterality: Left;  . REVERSE SHOULDER ARTHROPLASTY Right 05/13/2018   Procedure: RIGHT REVERSE SHOULDER ARTHROPLASTY;  Surgeon: Meredith Pel, MD;  Location: [place];  Service: Orthopedics;  Laterality: Right;  . rod placement Right 03/2009   R knee  . SYNOVECTOMY  06/2005   debridement, liner exchange right knee  . TOE AMPUTATION Left 08/2008   great toe - osteomyelitis (staph infection)  . TOE AMPUTATION Left 04/2016   2nd toe [name]  . TONSILLECTOMY    . TOTAL KNEE ARTHROPLASTY Left 01/15/2017  . TOTAL KNEE ARTHROPLASTY Left 01/15/2017   Procedure: TOTAL KNEE ARTHROPLASTY;  Surgeon: Meredith Pel, MD;  Location: [place];  Service: Orthopedics;  Laterality: Left;   Social History:  reports that he has never smoked. He has been exposed to tobacco smoke. He has never used smokeless tobacco. He reports that he does not drink alcohol and does not use drugs.  Allergies  Allergen Reactions  . Peanut-Containing Drug Products Anaphylaxis and Dermatitis  . Yellow Jacket Venom [Bee Venom] Anaphylaxis and Other (See Comments)    Respiratory Distress  . Celebrex [Celecoxib] Other (See Comments)    BLACK STOOL?MELENA?  [name] Percocet [Oxycodone-Acetaminophen] Hives, Swelling and Other (See Comments)  . Latex Rash  . Nsaids Rash    Family History   Problem Relation Age of Onset  . Coronary artery disease Father 46  . Stroke Father   . CAD Mother 69       stents  . Breast cancer Sister   . Diabetes Brother   . Diabetes Sister   . Colon cancer Neg Hx   . Stomach cancer Neg Hx     Prior to Admission medications   Medication Sig Start Date End Date Taking? Authorizing Provider  acetaminophen (TYLENOL) 500 MG tablet Take 1,000 mg by mouth 3 (three) times daily.    [provider]  amitriptyline (ELAVIL) 50 MG tablet TAKE TWO TABLETS BY MOUTH EVERYDAY AT BEDTIME Patient taking differently: Take 100 mg by mouth at bedtime. 12/26/21   [name], MD  apixaban Arne Cleveland) 5  MG TABS tablet Take 1 tablet (5 mg total) by mouth 2 (two) times daily. 08/25/22 09/24/22  Richarda Osmond, MD  atorvastatin (LIPITOR) 20 MG tablet TAKE ONE TABLET BY MOUTH ONCE DAILY 02/21/22   [name], MD  B Complex Vitamins (B-COMPLEX/B-12 PO) Take 1 tablet by mouth daily.    [provider]  ceFAZolin (ANCEF) IVPB Inject 2 g into the vein every 8 (eight) hours for 8 days. Indication: MSSA bacteremia First Dose: Yes Last Day of Therapy: 09/01/2022 Labs - Once weekly: CBC/D and BMP Method of administration: IV Push Method of administration may be changed at the discretion of home infusion pharmacist based upon assessment of the patient and/or caregiver's ability to self-administer the medication ordered. Midline Care Per Protocol: Home health RN for IV administration and teaching; Midline care and labs.   Please pull midline at completion of IV antibiotics Fax weekly labs to [phone] 08/25/22 09/02/22  Richarda Osmond, MD  cetirizine (ZYRTEC) 10 MG tablet Take 1 tablet (10 mg total) by mouth daily. 07/25/22   Michela Pitcher, NP  Cholecalciferol (VITAMIN D3) 25 MCG (1000 UT) CAPS Take 1 capsule (1,000 Units total) by mouth daily. 03/24/21   [name], MD  clotrimazole (LOTRIMIN) 1 % cream Apply 1 application  topically 2 (two) times daily. To groin Patient taking differently: Apply 1 application  topically 2 (two) times daily as needed (rash). 05/26/18   [name], MD  divalproex (DEPAKOTE ER) 500 MG 24 hr tablet TAKE ONE TABLET BY MOUTH EVERYDAY AT BEDTIME 06/20/22   Suzzanne Cloud, NP  EPINEPHrine 0.3 mg/0.3 mL IJ SOAJ injection Inject 0.3 mg into the muscle as needed for anaphylaxis. (bee stings) 12/18/21   [name], MD  ferrous sulfate 325 (65 FE) MG EC tablet Take 325 mg by mouth daily with breakfast.    [provider]  fluticasone (FLONASE) 50 MCG/ACT nasal spray Place 2 sprays into both nostrils daily. 07/25/22   Michela Pitcher, NP  Fremanezumab-vfrm (AJOVY) 225 MG/1.5ML SOAJ Inject 225 mg into the skin every 30 (thirty) days. 07/24/22   Suzzanne Cloud, NP  furosemide (LASIX) 20 MG tablet TAKE ONE TABLET BY MOUTH EVERY MORNING 05/23/22   [name], MD  gabapentin (NEURONTIN) 300 MG capsule TAKE ONE CAPSULE BY MOUTH EVERY MORNING and TAKE TWO CAPSULES BY MOUTH AT NOON and TAKE TWO CAPSULES BY MOUTH EVERY EVENING 08/16/22   [name], MD  ketoconazole (NIZORAL) 2 % shampoo Apply 1 application topically 3 (three) times a week. 12/14/19   [name], MD  levothyroxine (SYNTHROID) 112 MCG tablet Take 1 tablet (112 mcg total) by mouth daily before breakfast. 02/24/22   [name], MD  linezolid (ZYVOX) 600 MG tablet Take 1 tablet (600 mg total) by mouth 2 (two) times daily for 14 days. 09/02/22 09/16/22  Richarda Osmond, MD  methocarbamol (ROBAXIN) 500 MG tablet TAKE ONE TABLET BY MOUTH twice daily AS NEEDED FOR muscle SPASMS 07/27/22   Newt Minion, MD  metoprolol succinate (TOPROL-XL) 100 MG 24 hr tablet TAKE ONE TABLET BY MOUTH EVERYDAY AT BEDTIME 03/22/22   [name], MD  nitroGLYCERIN (NITROSTAT) 0.4 MG SL tablet Place 1 tablet (0.4 mg total) under the tongue every 5 (five) minutes as needed for chest pain. 11/27/21   [name], MD   nystatin (MYCOSTATIN) 100000 UNIT/ML suspension Take 5 mLs (500,000 Units total) by mouth 4 (four) times daily. 07/12/22   Eugenia Pancoast, FNP  ondansetron (ZOFRAN-ODT) 4  MG disintegrating tablet TAKE ONE TABLET BY MOUTH EVERY 8 HOURS AS NEEDED 04/20/22   Suzzanne Cloud, NP  pantoprazole (PROTONIX) 40 MG tablet Take 1 tablet (40 mg total) by mouth every other day. 03/01/22   [name], MD  tamsulosin (FLOMAX) 0.4 MG CAPS capsule Take 1 capsule (0.4 mg total) by mouth daily. 08/16/22   Fritzi Mandes, MD  tiZANidine (ZANAFLEX) 4 MG tablet TAKE ONE TABLET BY MOUTH every SIX hours AS NEEDED FOR acute HEADACHE 07/19/22   Suzzanne Cloud, NP  Ubrogepant (UBRELVY) 100 MG TABS Take 100 mg by mouth as needed (take 1 tablet at onset of headache, may repeat in hours, max is 200 mg in 24 hours). 07/24/22   Suzzanne Cloud, NP    Physical Exam: Vitals:   08/26/22 1856  BP: 108/71  Pulse: 100  Resp: 18  Temp: 98.4 F (36.9 C)  TempSrc: Oral  SpO2: 100%   Physical Exam  Labs on Admission: I have personally reviewed following labs and imaging studies  CBC: Recent Labs  Lab 08/20/22 0542 08/21/22 0804 08/22/22 0731 08/24/22 0651  WBC 10.6* 14.7* 12.7* 14.6*  NEUTROABS 8.2*  --   --   --   HGB 13.6 14.0 15.1 13.9  HCT 39.4 41.1 44.8 40.7  MCV 91.8 91.5 94.3 94.9  PLT 214 207 134* 417   Basic Metabolic Panel: Recent Labs  Lab 08/20/22 0542 08/21/22 0804 08/22/22 0731 08/23/22 0546 08/24/22 0651  NA 137 136 136 138 135  K 3.9 4.0 4.9 5.2* 4.2  CL 105 104 104 104 104  CO2 '26 28 26 31 26  '$ GLUCOSE 156* 132* 105* 102* 93  BUN 37* 35* 30* 28* 22  CREATININE 0.97 0.91 1.00 1.02 1.00  CALCIUM 7.8* 7.7* 7.9* 7.5* 7.5*  MG  --   --   --   --  2.2   GFR: Estimated Creatinine Clearance: 82.7 mL/min (by C-G formula based on SCr of 1 mg/dL). Liver Function Tests: Recent Labs  Lab 08/20/22 0542 08/21/22 0804 08/22/22 0731 08/23/22 0546  AST 37 49* 42* 36  ALT 54* 69* 69* 56*  ALKPHOS  46 45 50 49  BILITOT 0.9 0.9 1.1 0.9  PROT 5.6* 5.4* 5.7* 5.2*  ALBUMIN 2.5* 2.5* 2.6* 2.3*   No results for input(s): "LIPASE", "AMYLASE" in the last 168 hours. No results for input(s): "AMMONIA" in the last 168 hours. Coagulation Profile: Recent Labs  Lab 08/23/22 0839  INR 1.2   Cardiac Enzymes: No results for input(s): "CKTOTAL", "CKMB", "CKMBINDEX", "TROPONINI" in the last 168 hours. BNP (last 3 results) No results for input(s): "PROBNP" in the last 8760 hours. HbA1C: No results for input(s): "HGBA1C" in the last 72 hours. CBG: No results for input(s): "GLUCAP" in the last 168 hours. Lipid Profile: No results for input(s): "CHOL", "HDL", "LDLCALC", "TRIG", "CHOLHDL", "LDLDIRECT" in the last 72 hours. Thyroid Function Tests: No results for input(s): "TSH", "T4TOTAL", "FREET4", "T3FREE", "THYROIDAB" in the last 72 hours. Anemia Panel: No results for input(s): "VITAMINB12", "FOLATE", "FERRITIN", "TIBC", "IRON", "RETICCTPCT" in the last 72 hours. Urine analysis:    Component Value Date/Time   COLORURINE YELLOW (A) 08/17/2022 0105   APPEARANCEUR CLEAR (A) 08/17/2022 0105   LABSPEC 1.010 08/17/2022 0105   PHURINE 7.0 08/17/2022 0105   GLUCOSEU NEGATIVE 08/17/2022 0105   HGBUR SMALL (A) 08/17/2022 0105   BILIRUBINUR NEGATIVE 08/17/2022 0105   KETONESUR NEGATIVE 08/17/2022 0105   PROTEINUR NEGATIVE 08/17/2022 0105   UROBILINOGEN 0.2 07/14/2009  [place] 08/17/2022 0105   LEUKOCYTESUR NEGATIVE 08/17/2022 0105    Radiological Exams on Admission: No results found.   Data Reviewed: Relevant notes from primary care and specialist visits, past discharge summaries as available in EHR, including Care Everywhere. Prior diagnostic testing as pertinent to current admission diagnoses Updated medications and problem lists for reconciliation ED course, including vitals, labs, imaging, treatment and response to treatment Triage notes, nursing and pharmacy notes and ED  provider's notes Notable results as noted in HPI   Assessment and Plan: * MSSA bacteremia 08/16/2022 Recent TEE was negative for vegetations Continue cefazolin 2 g Q8 from 11/4 of-11/8 followed by oral linezolid 11/19 to 11/29 Patient with difficulty self administering home cefazolin TOC consult and further discussion with patient/family regarding disposition options to ensure medication administration Consider ID consult if a daily alternative is available    Chronic diastolic CHF (congestive heart failure) ([place]) Clinically euvolemic Continue metoprolol and furosemide  Hypothyroidism Continue levothyroxine  COVID-19 08/12/22 completed Paxlovid 08/20/22 Asymptomatic Off airborne precautions  Persistent atrial fibrillation ([place]) S/p TEE cardioversion for RVR on 08/24/2022 Patient currently in sinus rhythm Continue apixaban and metoprolol  Seizure disorder ([facility]) Continue Depakote and Neurontin  Chronic headache Continue Ajovy        DVT prophylaxis: Lovenox  Consults: none  Advance Care Planning:   Code Status: Prior   Family Communication: none  Disposition Plan: Back to previous home environment  Severity of Illness: The appropriate patient status for this patient is OBSERVATION. Observation status is judged to be reasonable and necessary in order to provide the required intensity of service to ensure the patient's safety. The patient's presenting symptoms, physical exam findings, and initial radiographic and laboratory data in the context of their medical condition is felt to place them at decreased risk for further clinical deterioration. Furthermore, it is anticipated that the patient will be medically stable for discharge from the hospital within 2 midnights of admission.   Author: Athena Masse, MD 08/26/2022 9:18 PM  For on call review [email or web address].

## 2022-08-26 NOTE — Hospital Course (Signed)
Diastolic CHF, hypothyroidism, , seizure disorder, Afib on Eliquis s/p cardioversion 08/24/2022 type 2 DM, recent COVID-19 08/12/2022 (completed Paxlovid on 11/6), recently hospitalized from 11/2 to 11/11 for MSSA bacteremia after presenting with weakness and falls, discharged 1 day ago on cefazolin IV 2 g Q8 through 09/01/2022 followed by linezolid 600 p.o. twice daily 11/19 to 12/3 who was sent back to the ED due to difficulty self administering his IV cefazolin following his discharge.  He has received 1 dose of the cefazolin since discharge.  Family called the discharging physician and he was instructed to return.  He denies any new complaints. ED course and data review: Vitals within normal limits.  No labs EKG or imaging done due to recent discharge the day prior. Labs from 11/10 reviewed and showed WBC of 14,600 with otherwise normal BMP and CBC.  EKG on 11/10 showed sinus at 56 with nonspecific ST-T wave changes. Hospitalist consulted for admission.

## 2022-08-26 NOTE — Progress Notes (Addendum)
Spoke with patient's son, Elvera Maria yesterday before and after patient was discharged home and today again regarding the course of his treatment after discharge.   Summary: 11/11- patient was discharged home in stable condition with midline catheter R arm to complete recommended course of cefazolin q8hr until 09/01/2022 and then switch to PO linezolid until 09/16/2022 as directed by ID consult, Dr. Juleen China. Home antibiotic and infusion/OPAT orders placed by pharmacist Burnard Leigh. TOC consult set up home health services as recommended by PT/OT and required for home infusions.  After arriving home, patient and his son were unable to obtain infusion services or medications same day so he reached out to hospital. I spoke with him and TOC and his nurses and ID physician, Dr. Baxter Flattery on call to try to remediate the situation. 11/12- Home services were delayed until this evening and patient ultimately missed about 4 doses of his medication before the infusion team came to administer his first dose at home.  Per son's report, pt has been in his bed and not eaten since getting home. Alert but confused. Afebrile.  It does not sound like he is in a state to be able to self-administer his IV medications at this time and has missed several doses so I recommended he bring him back to the ED or call EMS to bring him back. Son expressed understanding and agreement with the plan.

## 2022-08-26 NOTE — Progress Notes (Signed)
Spoke with patient's son, Elvera Maria yesterday before and after patient was discharged home and today again regarding the course of his treatment after discharge.   Summary: 11/11- patient was discharged home in stable condition with midline catheter R arm to complete recommended course of cefazolin q8hr until 09/01/2022 and then switch to PO linezolid until 09/16/2022 as directed by ID consult, Dr. Juleen China. Home antibiotic and infusion/OPAT orders placed by pharmacist Burnard Leigh. TOC consult set up home health services as recommended by PT/OT and required for home infusions.  After arriving home, patient and his son were unable to obtain infusion services or medications same day so he reached out to hospital. I spoke with him and TOC and his nurses and ID physician, Dr. Baxter Flattery on call to try to remediate the situation. 11/12- Home services were delayed until this evening and patient ultimately missed about 4 doses of his medication before the infusion team came to administer his first dose at home.  Per son's report, pt has been in his bed and not eaten since getting home. Alert but confused. Afebrile.  It does not sound like he is in a state to be able to self-administer his IV medications at this time and has missed several doses so I recommended he bring him back to the ED or call EMS to bring him back. Son expressed understanding and agreement with the plan.

## 2022-08-26 NOTE — ED Triage Notes (Signed)
Pt was DC yesterday with a PICC line. Pt is unable to admin his own meds as he has to hook the med to the PICC line. Pt was given a box of abx with multiple syringes. Family is very upset that social work and the hospital discharged him back home and not to a facility.

## 2022-08-26 NOTE — Assessment & Plan Note (Deleted)
Appears diet controlled Sliding scale insulin coverage

## 2022-08-26 NOTE — Assessment & Plan Note (Addendum)
Recent TEE was negative for vegetations Continue cefazolin 2 g Q8 from 11/4 of-11/8 followed by oral linezolid 11/19 to 11/29 Patient with difficulty self administering home cefazolin TOC consult and further discussion with patient/family regarding disposition options to ensure medication administration Consider ID consult if a daily alternative is available

## 2022-08-26 NOTE — Assessment & Plan Note (Signed)
S/p TEE cardioversion for RVR on 08/24/2022 Patient currently in sinus rhythm Continue apixaban and metoprolol

## 2022-08-26 NOTE — Assessment & Plan Note (Signed)
Asymptomatic Off airborne precautions

## 2022-08-26 NOTE — ED Provider Notes (Signed)
[facility] Provider Note    Event Date/Time   First MD Initiated Contact with Patient 08/26/22 2016     (approximate)   History   Unable to Admin Abx   HPI  Nicholas [name] is a 71 y.o. male who was just discharged from the hospital yesterday with a PICC line for continued treatment of MSSA bacteremia.  His son explains there was confusion and miscommunication because home health nurse is not able to give every 8 hour infusion which is what he is scheduled for.  Spoke to the on-call doctor who recommended he come back to the emergency department today     Physical Exam   Triage Vital Signs: ED Triage Vitals  Enc Vitals Group     BP 08/26/22 1856 108/71     Pulse Rate 08/26/22 1856 100     Resp 08/26/22 1856 18     Temp 08/26/22 1856 98.4 F (36.9 C)     Temp Source 08/26/22 1856 Oral     SpO2 08/26/22 1856 100 %     Weight --      Height --      Head Circumference --      Peak Flow --      Pain Score 08/26/22 1924 0     Pain Loc --      Pain Edu? --      Excl. in [place]? --     Most recent vital signs: Vitals:   08/26/22 1856  BP: 108/71  Pulse: 100  Resp: 18  Temp: 98.4 F (36.9 C)  SpO2: 100%     General: Awake, no distress.  CV:  Good peripheral perfusion.  Resp:  Normal effort.  Abd:  No distention.  Other:     ED Results / Procedures / Treatments   Labs (all labs ordered are listed, but only abnormal results are displayed) Labs Reviewed  COMPREHENSIVE METABOLIC PANEL     EKG     RADIOLOGY     PROCEDURES:  Critical Care performed:   Procedures   MEDICATIONS ORDERED IN ED: Medications  ceFAZolin (ANCEF) IVPB 2g/100 mL premix (has no administration in time range)  atorvastatin (LIPITOR) tablet 20 mg (has no administration in time range)  furosemide (LASIX) tablet 20 mg (has no administration in time range)  metoprolol succinate (TOPROL-XL) 24 hr tablet 100 mg (has no administration in time range)   nitroGLYCERIN (NITROSTAT) SL tablet 0.4 mg (has no administration in time range)  amitriptyline (ELAVIL) tablet 100 mg (has no administration in time range)  levothyroxine (SYNTHROID) tablet 112 mcg (has no administration in time range)  pantoprazole (PROTONIX) EC tablet 40 mg (has no administration in time range)  tamsulosin (FLOMAX) capsule 0.4 mg (has no administration in time range)  apixaban (ELIQUIS) tablet 5 mg (has no administration in time range)  divalproex (DEPAKOTE ER) 24 hr tablet 500 mg (has no administration in time range)  gabapentin (NEURONTIN) capsule 100 mg (has no administration in time range)  methocarbamol (ROBAXIN) tablet 500 mg (has no administration in time range)  acetaminophen (TYLENOL) tablet 650 mg (has no administration in time range)    Or  acetaminophen (TYLENOL) suppository 650 mg (has no administration in time range)  ondansetron (ZOFRAN) tablet 4 mg (has no administration in time range)    Or  ondansetron (ZOFRAN) injection 4 mg (has no administration in time range)     IMPRESSION / MDM / ASSESSMENT AND PLAN / ED COURSE  I  reviewed the triage vital signs and the nursing notes. Patient's presentation is most consistent with acute, uncomplicated illness.  Patient unable to self administer IV medications per infusion nurse, last dose of antibiotics was today at 5:00.  Infusion nurse was there and determined that he was unable to self administer and there is no one to help him so patient is return to the emergency department.  I have discussed with the hospitalist who will readmit the patient so that we can tablets appropriate assistance for him        FINAL CLINICAL IMPRESSION(S) / ED DIAGNOSES   Final diagnoses:  Bacteremia     Rx / DC Orders   ED Discharge Orders     None        Note:  This document was prepared using Dragon voice recognition software and may include unintentional dictation errors.   Lavonia Drafts, MD 08/26/22  [id number]

## 2022-08-26 NOTE — Assessment & Plan Note (Signed)
Clinically euvolemic Continue metoprolol and furosemide

## 2022-08-26 NOTE — Assessment & Plan Note (Signed)
Continue levothyroxine 

## 2022-08-26 NOTE — Assessment & Plan Note (Signed)
Continue Ajovy

## 2022-08-27 ENCOUNTER — Telehealth: Payer: Self-pay | Admitting: *Deleted

## 2022-08-27 ENCOUNTER — Other Ambulatory Visit: Payer: Self-pay

## 2022-08-27 ENCOUNTER — Encounter: Payer: Self-pay | Admitting: Internal Medicine

## 2022-08-27 DIAGNOSIS — Z91040 Latex allergy status: Secondary | ICD-10-CM | POA: Diagnosis not present

## 2022-08-27 DIAGNOSIS — Z803 Family history of malignant neoplasm of breast: Secondary | ICD-10-CM | POA: Diagnosis not present

## 2022-08-27 DIAGNOSIS — I5032 Chronic diastolic (congestive) heart failure: Secondary | ICD-10-CM | POA: Diagnosis present

## 2022-08-27 DIAGNOSIS — Z91010 Allergy to peanuts: Secondary | ICD-10-CM | POA: Diagnosis not present

## 2022-08-27 DIAGNOSIS — Z96653 Presence of artificial knee joint, bilateral: Secondary | ICD-10-CM | POA: Diagnosis present

## 2022-08-27 DIAGNOSIS — R7881 Bacteremia: Secondary | ICD-10-CM | POA: Diagnosis present

## 2022-08-27 DIAGNOSIS — Z8616 Personal history of COVID-19: Secondary | ICD-10-CM | POA: Diagnosis not present

## 2022-08-27 DIAGNOSIS — B9561 Methicillin susceptible Staphylococcus aureus infection as the cause of diseases classified elsewhere: Secondary | ICD-10-CM | POA: Diagnosis present

## 2022-08-27 DIAGNOSIS — M1990 Unspecified osteoarthritis, unspecified site: Secondary | ICD-10-CM | POA: Diagnosis present

## 2022-08-27 DIAGNOSIS — E039 Hypothyroidism, unspecified: Secondary | ICD-10-CM | POA: Diagnosis present

## 2022-08-27 DIAGNOSIS — Z886 Allergy status to analgesic agent status: Secondary | ICD-10-CM | POA: Diagnosis not present

## 2022-08-27 DIAGNOSIS — Z7901 Long term (current) use of anticoagulants: Secondary | ICD-10-CM | POA: Diagnosis not present

## 2022-08-27 DIAGNOSIS — K760 Fatty (change of) liver, not elsewhere classified: Secondary | ICD-10-CM | POA: Diagnosis present

## 2022-08-27 DIAGNOSIS — Z885 Allergy status to narcotic agent status: Secondary | ICD-10-CM | POA: Diagnosis not present

## 2022-08-27 DIAGNOSIS — R519 Headache, unspecified: Secondary | ICD-10-CM | POA: Diagnosis present

## 2022-08-27 DIAGNOSIS — Z96612 Presence of left artificial shoulder joint: Secondary | ICD-10-CM | POA: Diagnosis present

## 2022-08-27 DIAGNOSIS — Z96611 Presence of right artificial shoulder joint: Secondary | ICD-10-CM | POA: Diagnosis present

## 2022-08-27 DIAGNOSIS — K219 Gastro-esophageal reflux disease without esophagitis: Secondary | ICD-10-CM | POA: Diagnosis present

## 2022-08-27 DIAGNOSIS — Z8249 Family history of ischemic heart disease and other diseases of the circulatory system: Secondary | ICD-10-CM | POA: Diagnosis not present

## 2022-08-27 DIAGNOSIS — Z823 Family history of stroke: Secondary | ICD-10-CM | POA: Diagnosis not present

## 2022-08-27 DIAGNOSIS — Z833 Family history of diabetes mellitus: Secondary | ICD-10-CM | POA: Diagnosis not present

## 2022-08-27 DIAGNOSIS — G40909 Epilepsy, unspecified, not intractable, without status epilepticus: Secondary | ICD-10-CM

## 2022-08-27 DIAGNOSIS — I4819 Other persistent atrial fibrillation: Secondary | ICD-10-CM | POA: Diagnosis present

## 2022-08-27 DIAGNOSIS — Z91038 Other insect allergy status: Secondary | ICD-10-CM | POA: Diagnosis not present

## 2022-08-27 LAB — COMPREHENSIVE METABOLIC PANEL
ALT: 21 U/L (ref 0–44)
AST: 20 U/L (ref 15–41)
Albumin: 2.5 g/dL — ABNORMAL LOW (ref 3.5–5.0)
Alkaline Phosphatase: 66 U/L (ref 38–126)
Anion gap: 6 (ref 5–15)
BUN: 17 mg/dL (ref 8–23)
CO2: 25 mmol/L (ref 22–32)
Calcium: 8.1 mg/dL — ABNORMAL LOW (ref 8.9–10.3)
Chloride: 104 mmol/L (ref 98–111)
Creatinine, Ser: 0.92 mg/dL (ref 0.61–1.24)
GFR, Estimated: >60 mL/min (ref >60)
Glucose, Bld: 122 mg/dL — ABNORMAL HIGH (ref 70–99)
Potassium: 4 mmol/L (ref 3.5–5.1)
Sodium: 135 mmol/L (ref 135–145)
Total Bilirubin: 2 mg/dL — ABNORMAL HIGH (ref 0.3–1.2)
Total Protein: 6.2 g/dL — ABNORMAL LOW (ref 6.5–8.1)

## 2022-08-27 NOTE — Progress Notes (Signed)
Progress Note    Nicholas [name]  ZOX:[id number] DOB: Oct 09, 1951  DOA: 08/26/2022 PCP: [name], MD      Brief Narrative:    Medical records reviewed and are as summarized below:  Nicholas [name] is a 71 y.o. male with medical history significant for Diastolic CHF, hypothyroidism, , seizure disorder, Afib on Eliquis s/p cardioversion 08/24/2022, recent COVID-19 08/12/2022 (completed Paxlovid on 11/6), recently hospitalized from 11/2 to 11/11 for MSSA bacteremia after presenting with weakness and falls, discharged 1 day ago on cefazolin IV 2 g Q8 through 09/01/2022 followed by linezolid 600 p.o. twice daily 11/19 to 12/3 who was sent back to the ED due to difficulty self administering his IV cefazolin following his discharge.  He has received 1 dose of the cefazolin since discharge.  Family called the discharging physician and he was instructed to return.  Hospitalist team was consulted to admit patient for IV antibiotics for MSSA bacteremia.      Assessment/Plan:   Principal Problem:   MSSA bacteremia 08/16/2022 Active Problems:   Chronic diastolic CHF (congestive heart failure) (HCC)   Hypothyroidism   Chronic headache   Seizure disorder (HCC)   Persistent atrial fibrillation (HCC)   COVID-19 08/12/22 completed Paxlovid 08/20/22     * MSSA bacteremia 08/16/2022 Recent TEE was negative for vegetations Continue cefazolin 2 g Q8 from 11/4 of-11/8 followed by oral linezolid 11/19 to 11/29 Patient said he had difficulty administering IV cefazolin at home.  Case manager recommended PT and OT evaluation.  Follow-up with case manager to assist with disposition       Chronic diastolic CHF (congestive heart failure) (HCC) Compensated Continue metoprolol and furosemide   Hypothyroidism Continue levothyroxine   COVID-19 08/12/22 completed Paxlovid 08/20/22 Asymptomatic Off airborne precautions   History of persistent atrial fibrillation (HCC) S/p TEE cardioversion  for RVR on 08/24/2022 Patient currently in sinus rhythm Continue apixaban and metoprolol   Seizure disorder (HCC) Continue Depakote and Neurontin   Chronic headache He is on monthly Ajovy        Diet Order             Diet Heart Room service appropriate? Yes; Fluid consistency: Thin  Diet effective now                            Consultants: None  Procedures: None    Medications:    amitriptyline  100 mg Oral QHS   apixaban  5 mg Oral BID   atorvastatin  20 mg Oral Daily   divalproex  500 mg Oral Daily   furosemide  20 mg Oral q morning   gabapentin  100 mg Oral TID   levothyroxine  112 mcg Oral QAC breakfast   metoprolol succinate  100 mg Oral Daily   pantoprazole  40 mg Oral QODAY   tamsulosin  0.4 mg Oral Daily   Continuous Infusions:   ceFAZolin (ANCEF) IV Stopped (08/27/22 0654)     Anti-infectives (From admission, onward)    Start     Dose/Rate Route Frequency Ordered Stop   09/02/22 1000  linezolid (ZYVOX) tablet 600 mg  Status:  Discontinued        600 mg Oral 2 times daily 08/26/22 2130 08/26/22 2142   08/26/22 2200  ceFAZolin (ANCEF) IVPB 2g/100 mL premix       Note to Pharmacy: Continue antibiotic regimen from recent hospitalization. Discharged on 08/25/22   2 g  200 mL/hr over 30 Minutes Intravenous Every 8 hours 08/26/22 2104                Family Communication/Anticipated D/C date and plan/Code Status   DVT prophylaxis:  apixaban (ELIQUIS) tablet 5 mg     Code Status: Full Code  Family Communication: None Disposition Plan: Plan to discharge home versus SNF   Status is: Observation The patient will require care spanning > 2 midnights and should be moved to inpatient because: He needs IV antibiotics for MSSA bacteremia       Subjective:   Interval events noted.  No fever, chills, vomiting, diarrhea, chest pain or shortness of breath  Objective:    Vitals:   08/27/22 0700 08/27/22 0710 08/27/22 0715  08/27/22 0730  BP: (!) 110/58   110/66  Pulse: 79 80 85 81  Resp: (!) 30 (!) 25 16 (!) 22  Temp:      TempSrc:      SpO2: 100% 98% 98% 97%   No data found.   Intake/Output Summary (Last 24 hours) at 08/27/2022 1027 Last data filed at 08/27/2022 0654 Gross per 24 hour  Intake 100 ml  Output --  Net 100 ml   There were no vitals filed for this visit.  Exam:  GEN: NAD SKIN: Warm and dry EYES: No pallor or ENT: MMM CV: RRR PULM: CTA B ABD: soft, ND, NT, +BS CNS: AAO x 3, non focal EXT: No edema or tenderness        Data Reviewed:   I have personally reviewed following labs and imaging studies:  Labs: Labs show the following:   Basic Metabolic Panel: Recent Labs  Lab 08/21/22 0804 08/22/22 0731 08/23/22 0546 08/24/22 0651 08/27/22 0500  NA 136 136 138 135 135  K 4.0 4.9 5.2* 4.2 4.0  CL 104 104 104 104 104  CO2 28 26 31 26 25   GLUCOSE 132* 105* 102* 93 122*  BUN 35* 30* 28* 22 17  CREATININE 0.91 1.00 1.02 1.00 0.92  CALCIUM 7.7* 7.9* 7.5* 7.5* 8.1*  MG  --   --   --  2.2  --    GFR Estimated Creatinine Clearance: 89.9 mL/min (by C-G formula based on SCr of 0.92 mg/dL). Liver Function Tests: Recent Labs  Lab 08/21/22 0804 08/22/22 0731 08/23/22 0546 08/27/22 0500  AST 49* 42* 36 20  ALT 69* 69* 56* 21  ALKPHOS 45 50 49 66  BILITOT 0.9 1.1 0.9 2.0*  PROT 5.4* 5.7* 5.2* 6.2*  ALBUMIN 2.5* 2.6* 2.3* 2.5*   No results for input(s): "LIPASE", "AMYLASE" in the last 168 hours. No results for input(s): "AMMONIA" in the last 168 hours. Coagulation profile Recent Labs  Lab 08/23/22 0839  INR 1.2    CBC: Recent Labs  Lab 08/21/22 0804 08/22/22 0731 08/24/22 0651  WBC 14.7* 12.7* 14.6*  HGB 14.0 15.1 13.9  HCT 41.1 44.8 40.7  MCV 91.5 94.3 94.9  PLT 207 134* 176   Cardiac Enzymes: No results for input(s): "CKTOTAL", "CKMB", "CKMBINDEX", "TROPONINI" in the last 168 hours. BNP (last 3 results) No results for input(s): "PROBNP" in the  last 8760 hours. CBG: No results for input(s): "GLUCAP" in the last 168 hours. D-Dimer: No results for input(s): "DDIMER" in the last 72 hours. Hgb A1c: No results for input(s): "HGBA1C" in the last 72 hours. Lipid Profile: No results for input(s): "CHOL", "HDL", "LDLCALC", "TRIG", "CHOLHDL", "LDLDIRECT" in the last 72 hours. Thyroid function studies: No results for  input(s): "TSH", "T4TOTAL", "T3FREE", "THYROIDAB" in the last 72 hours.  Invalid input(s): "FREET3" Anemia work up: No results for input(s): "VITAMINB12", "FOLATE", "FERRITIN", "TIBC", "IRON", "RETICCTPCT" in the last 72 hours. Sepsis Labs: Recent Labs  Lab 08/21/22 0804 08/22/22 0731 08/24/22 0651  WBC 14.7* 12.7* 14.6*    Microbiology Recent Results (from the past 240 hour(s))  Culture, blood (Routine X 2) w Reflex to ID Panel     Status: None   Collection Time: 08/19/22  6:16 AM   Specimen: BLOOD  Result Value Ref Range Status   Specimen Description BLOOD BLOOD LEFT ARM  Final   Special Requests   Final    BOTTLES DRAWN AEROBIC AND ANAEROBIC Blood Culture adequate volume   Culture   Final    NO GROWTH 5 DAYS Performed at [facility], [address], [place], [place] [address]    Report Status 08/24/2022 FINAL  Final  Culture, blood (Routine X 2) w Reflex to ID Panel     Status: None   Collection Time: 08/19/22  6:18 AM   Specimen: BLOOD  Result Value Ref Range Status   Specimen Description BLOOD BLOOD RIGHT HAND  Final   Special Requests   Final    BOTTLES DRAWN AEROBIC AND ANAEROBIC Blood Culture adequate volume   Culture   Final    NO GROWTH 5 DAYS Performed at [facility], [address], [place], [place] [address]    Report Status 08/24/2022 FINAL  Final  Surgical PCR screen     Status: Abnormal   Collection Time: 08/20/22 10:04 AM   Specimen: Nasal Mucosa; Nasal Swab  Result Value Ref Range Status   MRSA, PCR NEGATIVE NEGATIVE Final   Staphylococcus aureus POSITIVE (A)  NEGATIVE Final    Comment: (NOTE) The Xpert SA Assay (FDA approved for NASAL specimens in patients 110 years of age and older), is one component of a comprehensive surveillance program. It is not intended to diagnose infection nor to guide or monitor treatment. Performed at [facility], [address] [place], [place] [address]     Procedures and diagnostic studies:  No results found.             LOS: 0 days   Alieah Brinton  [facility]   Pager on [email or web address]. If 7PM-7AM, please contact night-coverage at [email or web address]     08/27/2022, 10:27 AM

## 2022-08-27 NOTE — Telephone Encounter (Signed)
To scheduling.

## 2022-08-27 NOTE — Evaluation (Signed)
Occupational Therapy Evaluation Patient Details Name: Nicholas [name] MRN: [id number] DOB: 1951-06-14 Today's Date: 08/27/2022   History of Present Illness Nicholas [name] is a 80, past medical history CAD/MI, GERD, PFO, remote history of seizure, questionable history of stroke/CHF.  Presented to the 08/16/2022 from home via EMS for fall,  found to be COVID +   Clinical Impression   Pt was seen for OT evaluation this date. Prior to hospital admission, pt was home for a few days since recent admission. No family present and pt alert and oriented x3, appears to have some impaired insight and requiring increased time for processing. Pt reports needing significant ADL/IADL assist for the past 4-5mo however, last recent admission reports he was indep. Pt currently very pain limited, endorsing 10/10 low L back pain with any mobility attempts. Was able to boost himself up in the bed using LUE and LLE with VC for bed rail. Declined EOB/OOB 2/2 pain. Premedicated prior to session. Pt presents to acute OT demonstrating impaired ADL performance and functional mobility 2/2 decreased strength, low back pain, and cognition (See OT problem list). Pt currently requires MOD-MAX A for LB ADL tasks and anticipate increased need for assist for mobility once pain better managed. Pt would benefit from skilled OT services to address noted impairments and functional limitations (see below for any additional details) in order to maximize safety and independence while minimizing falls risk and caregiver burden. Upon hospital discharge, recommend STR to maximize pt safety and return to PLOF.     Recommendations for follow up therapy are one component of a multi-disciplinary discharge planning process, led by the attending physician.  Recommendations may be updated based on patient status, additional functional criteria and insurance authorization.   Follow Up Recommendations  Skilled nursing-short term rehab (<3 hours/day)      Assistance Recommended at Discharge Frequent or constant Supervision/Assistance  Patient can return home with the following A lot of help with walking and/or transfers;A lot of help with bathing/dressing/bathroom;Assistance with cooking/housework;Assist for transportation;Help with stairs or ramp for entrance    Functional Status Assessment  Patient has had a recent decline in their functional status and demonstrates the ability to make significant improvements in function in a reasonable and predictable amount of time.  Equipment Recommendations  Other (comment) (defer to next venue)    Recommendations for Other Services       Precautions / Restrictions Precautions Precautions: Fall Restrictions Weight Bearing Restrictions: No      Mobility Bed Mobility Overal bed mobility: Needs Assistance             General bed mobility comments: pt declined OOB attempts 2/2 significant low back pain, was able to boost himself up in bed with bed flat and VC for hand palcement/bed rail use    Transfers                          Balance                                           ADL either performed or assessed with clinical judgement   ADL Overall ADL's : Needs assistance/impaired  General ADL Comments: Pt currently requires at least MOD A for LB ADL tasks, significantly limited by low back pain, anticipate significant assist for ADL mobility given pain and weakness     Vision         Perception     Praxis      Pertinent Vitals/Pain Pain Assessment Pain Assessment: 0-10 Pain Score: 10-Worst pain ever Pain Location: low abck Pain Descriptors / Indicators: Aching, Guarding, Grimacing Pain Intervention(s): Limited activity within patient's tolerance, Monitored during session, Repositioned, Patient requesting pain meds-RN notified     Hand Dominance     Extremity/Trunk Assessment Upper  Extremity Assessment Upper Extremity Assessment: Generalized weakness   Lower Extremity Assessment Lower Extremity Assessment: Generalized weakness RLE Deficits / Details: R knee maintains extension at all times as pt reports he has a "steel rod" in it & has been unable to flex knee at all x 2 years       Communication Communication Communication: HOH   Cognition Arousal/Alertness: Awake/alert Behavior During Therapy: WFL for tasks assessed/performed Overall Cognitive Status: No family/caregiver present to determine baseline cognitive functioning                                 General Comments: pt alert and oriented x3, follows simple commands with cues, increased processing time     General Comments       Exercises     Shoulder Instructions      Home Living Family/patient expects to be discharged to:: Skilled nursing facility (ILF) Living Arrangements: Alone Available Help at Discharge: Available PRN/intermittently;Family Type of Home: Independent living facility Home Access: Ramped entrance     Home Layout: One level               Home Equipment: Rollator (4 wheels);Cane - single point;Electric scooter   Additional Comments: [facility].      Prior Functioning/Environment Prior Level of Function : Independent/Modified Independent;Driving             Mobility Comments: Pt reports he's ambulatory with rollator but endorses many falls recently, getting weaker as well. ADLs Comments: reports needing a lot of help for basic ADL and all IADL within last 4-27month        OT Problem List: Decreased activity tolerance;Decreased safety awareness;Decreased strength;Pain;Decreased knowledge of use of DME or AE;Impaired balance (sitting and/or standing)      OT Treatment/Interventions: Self-care/ADL training;Therapeutic exercise;Energy conservation;DME and/or AE instruction;Therapeutic activities;Patient/family  education;Balance training    OT Goals(Current goals can be found in the care plan section) Acute Rehab OT Goals Patient Stated Goal: have less pain and get better OT Goal Formulation: With patient Time For Goal Achievement: 09/10/22 Potential to Achieve Goals: Good  OT Frequency: Min 2X/week    Co-evaluation              AM-PAC OT "6 Clicks" Daily Activity     Outcome Measure Help from another person eating meals?: None Help from another person taking care of personal grooming?: A Little Help from another person toileting, which includes using toliet, bedpan, or urinal?: A Lot Help from another person bathing (including washing, rinsing, drying)?: A Lot Help from another person to put on and taking off regular upper body clothing?: A Little Help from another person to put on and taking off regular lower body clothing?: A Lot 6 Click Score: 16   End of Session Nurse Communication:  Patient requests pain meds  Activity Tolerance: Patient limited by pain Patient left: in bed;with call bell/phone within reach;with bed alarm set  OT Visit Diagnosis: Other abnormalities of gait and mobility (R26.89);Pain Pain - Right/Left: Left Pain - part of body:  (low abck)                Time: 8088-1103 OT Time Calculation (min): 15 min Charges:  OT General Charges $OT Visit: 1 Visit OT Evaluation $OT Eval Moderate Complexity: 1 Mod  Ardeth Perfect., MPH, MS, OTR/L ascom [phone] 08/27/22, 5:02 PM

## 2022-08-27 NOTE — Telephone Encounter (Signed)
-----   Message from [place], PA-C sent at 08/25/2022  8:37 AM EST ----- Regarding: Hosp follow-up Pt needs hosp follow-up in 3-4 weeks

## 2022-08-27 NOTE — NC FL2 (Signed)
[place] LEVEL OF CARE SCREENING TOOL     IDENTIFICATION  Patient Name: Nicholas [name] Birthdate: 1951/02/21 Sex: male Admission Date (Current Location): 08/26/2022  [facility] and [place] Number:  Engineering geologist and Address:  [facility], [address] [place], [place] [address]      Provider Number: [id number]  Attending Physician Name and Address:  Jennye Boroughs, MD  Relative Name and Phone Number:  Ausar Georgiou [phone]    Current Level of Care: Hospital Recommended Level of Care: [place] Prior Approval Number:    Date Approved/Denied:   PASRR Number: [id number]  Discharge Plan: SNF    Current Diagnoses: Patient Active Problem List   Diagnosis Date Noted   COVID-19 08/12/22 completed Paxlovid 08/20/22 08/26/2022   Acute endocarditis 08/24/2022   SOB (shortness of breath) 08/24/2022   Atrial fibrillation with rapid ventricular response ([place]) 08/24/2022   Sepsis ([place]) 08/23/2022   Persistent atrial fibrillation ([place]) 08/22/2022   Atrial fibrillation with controlled ventricular rate ([place]) 08/22/2022   Type 2 diabetes mellitus with foot ulcer, without long-term current use of insulin ([place]) 08/21/2022   Paroxysmal atrial fibrillation ([place]) 08/19/2022   MSSA bacteremia 08/16/2022 08/18/2022   Bacteremia 08/18/2022   Seizure disorder ([place]) 08/17/2022   Atrial fibrillation with RVR ([place]) 08/17/2022   Fall 08/17/2022   COVID-19 08/17/2022   Weakness 08/16/2022   Acute urinary retention 08/13/2022   History of seizure 53/97/6734   Acute metabolic encephalopathy 19/37/9024   Pneumonia due to COVID-19 virus 08/12/2022   AKI (acute kidney injury) ([place]) 08/12/2022   Rhabdomyolysis 08/12/2022   PND (post-nasal drip) 07/25/2022   Oral candida 07/15/2022   Sore throat 07/15/2022   Open wound of foot with complication, left, initial encounter 06/16/2022   Hx of excision of lamina of lumbar vertebra for  decompression of spinal cord 03/20/2022   Anemia 02/28/2022   Dysuria 02/28/2022   Peripheral edema 02/28/2022   Recurrent major depressive disorder ([place]) 02/28/2022   Chronic headache 02/28/2022   Recurrent falls 08/25/2021   Memory difficulty 07/04/2021   Chronic toe ulcer, left, with fat layer exposed ([place]) 01/23/2021   Pituitary adenoma ([place]) 09/06/2020   Chronic intractable headache 07/27/2020   Vitamin B12 deficiency 03/31/2020   Vitamin D deficiency 03/31/2020   Numbness of foot 03/25/2020   Status post amputation of great toe, left ([place]) 12/14/2019   Callus of foot 04/02/2019   Allergy to bee sting 03/11/2019   Insomnia 02/10/2019   Status post lumbar spine operative procedure for decompression of spinal cord 12/12/2018   Dyslipidemia 10/19/2018   Foot deformity 10/19/2018   Status post reverse total replacement of right shoulder 05/13/2018   Chronic diastolic CHF (congestive heart failure) ([place]) 12/27/2017   OSA (obstructive sleep apnea) 12/27/2017   CKD (chronic kidney disease) stage 3, GFR 30-59 ml/min (HCC) 10/01/2017   Fatty liver 10/01/2017   Status post reverse total replacement of left shoulder 09/03/2017   Bilateral shoulder pain 05/24/2017   History of CVA in adulthood 03/08/2017   S/P total knee arthroplasty, left 01/30/2017   Osteoarthritis of knees, bilateral 01/15/2017   Obesity, morbid, BMI 40.0-49.9 ([place]) 07/19/2016   Hypothyroidism    Iron deficiency 04/23/2016   Chronic pain syndrome 04/23/2016   Inflammatory arthritis 04/23/2016   Chronic leg pain 04/23/2016   Lesion of palate 04/23/2016   Grieving 04/23/2016   GERD (gastroesophageal reflux disease)    Duodenitis determined by biopsy 02/13/2016   Diverticulosis of colon  without hemorrhage 01/74/9449   Complicated grief 67/59/1638   Migraine 01/11/2012   History of methicillin resistant staphylococcus aureus (MRSA) 07/04/2010   DDD (degenerative disc disease), lumbar 07/04/2010   History of  total right knee replacement 07/04/2010   Contact dermatitis 07/04/2010   Lesion of ulnar nerve 07/04/2010   Closed fracture of malar bone ([place]) 07/04/2010   Methicillin susceptible Staphylococcus aureus infection 07/04/2010   Narrowing of intervertebral disc space 07/04/2010   Olecranon bursitis 07/04/2010   History of artificial joint 07/04/2010   Primary osteoarthritis, right shoulder 06/09/2007   Osteoarthritis 06/09/2007   Personal history of colonic adenoma 06/01/2003    Orientation RESPIRATION BLADDER Height & Weight     Self, Time, Situation, Place  Normal Continent Weight:   Height:     BEHAVIORAL SYMPTOMS/MOOD NEUROLOGICAL BOWEL NUTRITION STATUS      Continent Diet (see discharge summary)  AMBULATORY STATUS COMMUNICATION OF NEEDS Skin   Limited Assist Verbally Other (Comment) (arm abrasion)                       Personal Care Assistance Level of Assistance  Bathing, Feeding, Dressing Bathing Assistance: Limited assistance Feeding assistance: Independent Dressing Assistance: Limited assistance     Functional Limitations Info  Sight, Hearing, Speech Sight Info: Impaired Hearing Info: Adequate Speech Info: Adequate    SPECIAL CARE FACTORS FREQUENCY  PT (By licensed PT), OT (By licensed OT)     PT Frequency: min 3 times per week OT Frequency: min 3 times per week            Contractures Contractures Info: Not present    Additional Factors Info  Code Status, Allergies Code Status Info: Full Allergies Info: Peanut-containing Drug Products   Anaphylaxis, Dermatitis  High  Allergy  01/10/2012  Yellow Jacket Venom (Bee Venom)   Anaphylaxis, Other (See Comments)  High  Allergy  01/29/2016  Respiratory Distress  Celebrex (Celecoxib)   Other (See Comments)  Medium  Hypersensitivity  01/29/2016  BLACK STOOL?MELENA?  Percocet (Oxycodone-acetaminophen)   Hives, Swelling, Other (See Comments)  Medium  Allergy  01/10/2012  Latex   Rash  Low  Allergy  01/29/2016  Adverse  Reactions/Drug Intolerances     Nsaids   Rash  Low  Intolerance  01/10/2012           Current Medications (08/27/2022):  This is the current hospital active medication list Current Facility-Administered Medications  Medication Dose Route Frequency Provider Last Rate Last Admin   acetaminophen (TYLENOL) tablet 650 mg  650 mg Oral Q6H PRN Athena Masse, MD       Or   acetaminophen (TYLENOL) suppository 650 mg  650 mg Rectal Q6H PRN Athena Masse, MD       amitriptyline (ELAVIL) tablet 100 mg  100 mg Oral QHS Judd Gaudier V, MD   100 mg at 08/26/22 2326   apixaban (ELIQUIS) tablet 5 mg  5 mg Oral BID Athena Masse, MD   5 mg at 08/27/22 0957   atorvastatin (LIPITOR) tablet 20 mg  20 mg Oral Daily Judd Gaudier V, MD   20 mg at 08/27/22 0957   ceFAZolin (ANCEF) IVPB 2g/100 mL premix  2 g Intravenous Q8H Athena Masse, MD   Stopped at 08/27/22 0654   divalproex (DEPAKOTE ER) 24 hr tablet 500 mg  500 mg Oral Daily Athena Masse, MD   500 mg at 08/27/22 0957   furosemide (LASIX) tablet 20 mg  20 mg Oral q morning Judd Gaudier V, MD   20 mg at 08/27/22 0737   gabapentin (NEURONTIN) capsule 100 mg  100 mg Oral TID Athena Masse, MD   100 mg at 08/27/22 0956   levothyroxine (SYNTHROID) tablet 112 mcg  112 mcg Oral QAC breakfast Athena Masse, MD   112 mcg at 08/27/22 0958   methocarbamol (ROBAXIN) tablet 500 mg  500 mg Oral Q8H PRN Athena Masse, MD       metoprolol succinate (TOPROL-XL) 24 hr tablet 100 mg  100 mg Oral Daily Judd Gaudier V, MD   100 mg at 08/27/22 0957   nitroGLYCERIN (NITROSTAT) SL tablet 0.4 mg  0.4 mg Sublingual Q5 min PRN Athena Masse, MD       ondansetron [facility]) tablet 4 mg  4 mg Oral Q6H PRN Athena Masse, MD       Or   ondansetron [facility]) injection 4 mg  4 mg Intravenous Q6H PRN Athena Masse, MD       pantoprazole (PROTONIX) EC tablet 40 mg  40 mg Oral Jeralene Huff, MD   40 mg at 08/27/22 0957   tamsulosin (FLOMAX) capsule 0.4 mg  0.4 mg  Oral Daily Athena Masse, MD   0.4 mg at 08/27/22 1062   Current Outpatient Medications  Medication Sig Dispense Refill   apixaban (ELIQUIS) 5 MG TABS tablet Take 1 tablet (5 mg total) by mouth 2 (two) times daily. 60 tablet 0   B Complex Vitamins (B-COMPLEX/B-12 PO) Take 1 tablet by mouth daily.     ceFAZolin (ANCEF) IVPB Inject 2 g into the vein every 8 (eight) hours for 8 days. Indication: MSSA bacteremia First Dose: Yes Last Day of Therapy: 09/01/2022 Labs - Once weekly: CBC/D and BMP Method of administration: IV Push Method of administration may be changed at the discretion of home infusion pharmacist based upon assessment of the patient and/or caregiver's ability to self-administer the medication ordered. Midline Care Per Protocol: Home health RN for IV administration and teaching; Midline care and labs.   Please pull midline at completion of IV antibiotics Fax weekly labs to [phone] 27 Units 0   cetirizine (ZYRTEC) 10 MG tablet Take 1 tablet (10 mg total) by mouth daily. 30 tablet 0   Cholecalciferol (VITAMIN D3) 25 MCG (1000 UT) CAPS Take 1 capsule (1,000 Units total) by mouth daily. 90 capsule 1   ferrous sulfate 325 (65 FE) MG EC tablet Take 325 mg by mouth daily with breakfast.     fluticasone (FLONASE) 50 MCG/ACT nasal spray Place 2 sprays into both nostrils daily. 16 g 0   furosemide (LASIX) 20 MG tablet TAKE ONE TABLET BY MOUTH EVERY MORNING 90 tablet 1   gabapentin (NEURONTIN) 300 MG capsule TAKE ONE CAPSULE BY MOUTH EVERY MORNING and TAKE TWO CAPSULES BY MOUTH AT NOON and TAKE TWO CAPSULES BY MOUTH EVERY EVENING 450 capsule 1   ketoconazole (NIZORAL) 2 % shampoo Apply 1 application topically 3 (three) times a week. 120 mL 3   levothyroxine (SYNTHROID) 112 MCG tablet Take 1 tablet (112 mcg total) by mouth daily before breakfast. 90 tablet 3   methocarbamol (ROBAXIN) 500 MG tablet TAKE ONE TABLET BY MOUTH twice daily AS NEEDED FOR muscle SPASMS 20 tablet 2   metoprolol  succinate (TOPROL-XL) 100 MG 24 hr tablet TAKE ONE TABLET BY MOUTH EVERYDAY AT BEDTIME 90 tablet 3   pantoprazole (PROTONIX) 40 MG tablet Take 1 tablet (  40 mg total) by mouth every other day.     tamsulosin (FLOMAX) 0.4 MG CAPS capsule Take 1 capsule (0.4 mg total) by mouth daily. 30 capsule 0   Ubrogepant (UBRELVY) 100 MG TABS Take 100 mg by mouth as needed (take 1 tablet at onset of headache, may repeat in hours, max is 200 mg in 24 hours). 12 tablet 11   acetaminophen (TYLENOL) 500 MG tablet Take 1,000 mg by mouth 3 (three) times daily.     amitriptyline (ELAVIL) 50 MG tablet TAKE TWO TABLETS BY MOUTH EVERYDAY AT BEDTIME (Patient taking differently: Take 100 mg by mouth at bedtime.) 180 tablet 2   atorvastatin (LIPITOR) 20 MG tablet TAKE ONE TABLET BY MOUTH ONCE DAILY 90 tablet 3   clotrimazole (LOTRIMIN) 1 % cream Apply 1 application topically 2 (two) times daily. To groin (Patient taking differently: Apply 1 application  topically 2 (two) times daily as needed (rash).) 60 g 1   divalproex (DEPAKOTE ER) 500 MG 24 hr tablet TAKE ONE TABLET BY MOUTH EVERYDAY AT BEDTIME 30 tablet 5   EPINEPHrine 0.3 mg/0.3 mL IJ SOAJ injection Inject 0.3 mg into the muscle as needed for anaphylaxis. (bee stings) 2 each 1   Fremanezumab-vfrm (AJOVY) 225 MG/1.5ML SOAJ Inject 225 mg into the skin every 30 (thirty) days. 1.68 mL 11   [START ON 09/02/2022] linezolid (ZYVOX) 600 MG tablet Take 1 tablet (600 mg total) by mouth 2 (two) times daily for 14 days. 28 tablet 0   nitroGLYCERIN (NITROSTAT) 0.4 MG SL tablet Place 1 tablet (0.4 mg total) under the tongue every 5 (five) minutes as needed for chest pain. 20 tablet 3   nystatin (MYCOSTATIN) 100000 UNIT/ML suspension Take 5 mLs (500,000 Units total) by mouth 4 (four) times daily. 60 mL 0   ondansetron (ZOFRAN-ODT) 4 MG disintegrating tablet TAKE ONE TABLET BY MOUTH EVERY 8 HOURS AS NEEDED 20 tablet 3   tiZANidine (ZANAFLEX) 4 MG tablet TAKE ONE TABLET BY MOUTH every SIX  hours AS NEEDED FOR acute HEADACHE 20 tablet 1     Discharge Medications: Please see discharge summary for a list of discharge medications.  Relevant Imaging Results:  Relevant Lab Results:   Additional Information SSN: [id number]  Shelbie Hutching, RN

## 2022-08-27 NOTE — Plan of Care (Signed)
  Problem: Education: Goal: Knowledge of risk factors and measures for prevention of condition will improve Outcome: Progressing   Problem: Coping: Goal: Psychosocial and spiritual needs will be supported Outcome: Progressing   Problem: Respiratory: Goal: Will maintain a patent airway Outcome: Progressing Goal: Complications related to the disease process, condition or treatment will be avoided or minimized Outcome: Progressing   Problem: Education: Goal: Knowledge of General Education information will improve Description: Including pain rating scale, medication(s)/side effects and non-pharmacologic comfort measures Outcome: Progressing   Problem: Health Behavior/Discharge Planning: Goal: Ability to manage health-related needs will improve Outcome: Progressing   Problem: Clinical Measurements: Goal: Ability to maintain clinical measurements within normal limits will improve Outcome: Progressing Goal: Will remain free from infection Outcome: Progressing Goal: Diagnostic test results will improve Outcome: Progressing Goal: Respiratory complications will improve Outcome: Progressing Goal: Cardiovascular complication will be avoided Outcome: Progressing   Problem: Activity: Goal: Risk for activity intolerance will decrease Outcome: Progressing   Problem: Coping: Goal: Level of anxiety will decrease Outcome: Progressing   Problem: Elimination: Goal: Will not experience complications related to bowel motility Outcome: Progressing Goal: Will not experience complications related to urinary retention Outcome: Progressing   Problem: Safety: Goal: Ability to remain free from injury will improve Outcome: Progressing   Problem: Skin Integrity: Goal: Risk for impaired skin integrity will decrease Outcome: Progressing

## 2022-08-27 NOTE — ED Notes (Signed)
Pt soiled with urine, change into clean diaper

## 2022-08-27 NOTE — Telephone Encounter (Signed)
LMOV to schedule  

## 2022-08-27 NOTE — ED Notes (Signed)
Breakfast tray provided to pt by dietary

## 2022-08-27 NOTE — TOC Progression Note (Signed)
Transition of Care [facility]) - Progression Note    Patient Details  Name: Nicholas [name] MRN: [id number] Date of Birth: [birth year]  [facility] CM/SW Contact  Shelbie Hutching, RN Phone Number: 08/27/2022, 4:54 PM  Clinical Narrative:    Spoke with patient's son and updated on plan of care for discharge to [facility] in [place].  [facility] offered a bed and bed offer accepted- insurance authorization started with [facility].    Expected Discharge Plan: [place] Barriers to Discharge: Continued Medical Work up  Expected Discharge Plan and Services Expected Discharge Plan: [place]   Discharge Planning Services: CM Consult Post Acute Care Choice: [place] Living arrangements for the past 2 months: Single Family Home                 DME Arranged: N/A DME Agency: NA                   Social Determinants of Health (SDOH) Interventions    Readmission Risk Interventions     No data to display

## 2022-08-27 NOTE — Anesthesia Postprocedure Evaluation (Signed)
Anesthesia Post Note  Patient: Nicholas [name]  Procedure(s) Performed: CARDIOVERSION TRANSESOPHAGEAL ECHOCARDIOGRAM (TEE)  Patient location during evaluation: [place] Recovery Anesthesia Type: General Level of consciousness: awake and alert Pain management: pain level controlled Vital Signs Assessment: post-procedure vital signs reviewed and stable Respiratory status: spontaneous breathing, nonlabored ventilation and respiratory function stable Cardiovascular status: blood pressure returned to baseline and stable Postop Assessment: no apparent nausea or vomiting Anesthetic complications: no   No notable events documented.   Last Vitals:  Vitals:   08/25/22 0924 08/25/22 1007  BP: (!) 141/79 120/70  Pulse: 84   Resp: 16   Temp:    SpO2: 97%     Last Pain:  Vitals:   08/25/22 0849  TempSrc:   PainSc: 0-No pain                 [place] Ouch

## 2022-08-27 NOTE — TOC Initial Note (Signed)
Transition of Care [facility]) - Initial/Assessment Note    Patient Details  Name: Nicholas [name] MRN: [id number] Date of Birth: 23-Feb-1951  [facility]) CM/SW Contact:    Shelbie Hutching, RN Phone Number: 08/27/2022, 11:41 AM  Clinical Narrative:                 Patient was discharged home over the weekend with IV antibiotics and home health services.  Patient is unable to do the IV antibiotics himself and they are scheduled 3 times per day.  RNCM spoke with Jeannene Patella, with [facility] and Malachy Mood, with [facility] home [facility].  [facility] could not see the patient until today and Pam was able to get [facility] to go out and see patient on Sunday but they can't go out 3 times per day to do the antibiotics.  Patient's son lives 40 minutes away and can't come 3 times per day to help with patient with the antibiotics.   RNCM met with patient at the bedside in the ED, he would like to go to [facility].  RNCM will start SNF workup.    Expected Discharge Plan: [place] Barriers to Discharge: Continued Medical Work up   Patient Goals and CMS Choice Patient states their goals for this hospitalization and ongoing recovery are:: patient would like to go to [place] Medicare.gov Compare Post Acute Care list provided to:: Patient Choice offered to / list presented to : Patient  Expected Discharge Plan and Services Expected Discharge Plan: [place]   Discharge Planning Services: CM Consult Post Acute Care Choice: [place] Living arrangements for the past 2 months: Single Family Home                 DME Arranged: N/A DME Agency: NA                  Prior Living Arrangements/Services Living arrangements for the past 2 months: Single Family Home Lives with:: Self Patient language and need for interpreter reviewed:: Yes Do you feel safe going back to the place where you live?: No   needs help with the IV  antibiotics  Need for Family Participation in Patient Care: Yes (Comment) Care giver support system in place?: Yes (comment)   Criminal Activity/Legal Involvement Pertinent to Current Situation/Hospitalization: No - Comment as needed  Activities of Daily Living Home Assistive Devices/Equipment: Wheelchair, Environmental consultant (specify type), Dentures (specify type), Eyeglasses ADL Screening (condition at time of admission) Patient's cognitive ability adequate to safely complete daily activities?: Yes Is the patient deaf or have difficulty hearing?: No Does the patient have difficulty seeing, even when wearing glasses/contacts?: No Does the patient have difficulty concentrating, remembering, or making decisions?: Yes Patient able to express need for assistance with ADLs?: Yes Does the patient have difficulty dressing or bathing?: Yes Independently performs ADLs?: No Communication: Independent Dressing (OT): Needs assistance Is this a change from baseline?: Pre-admission baseline Grooming: Needs assistance Is this a change from baseline?: Pre-admission baseline Feeding: Independent Bathing: Needs assistance Is this a change from baseline?: Pre-admission baseline Toileting: Needs assistance Is this a change from baseline?: Pre-admission baseline In/Out Bed: Needs assistance Is this a change from baseline?: Pre-admission baseline Walks in Home: Needs assistance Is this a change from baseline?: Pre-admission baseline Does the patient have difficulty walking or climbing stairs?: Yes Weakness of Legs: Both Weakness of Arms/Hands: None  Permission Sought/Granted Permission sought to share information with : Case Manager, Family Supports,  Facility Art therapist granted to share information with : Yes, Verbal Permission Granted  Share Information with NAME: Vashaun Osmon  Permission granted to share info w AGENCY: [employer] granted to share info w  Relationship: son  Permission granted to share info w Contact Information: [phone]  Emotional Assessment Appearance:: Appears stated age Attitude/Demeanor/Rapport: Engaged Affect (typically observed): Accepting Orientation: : Oriented to Self, Oriented to Place, Oriented to  Time, Oriented to Situation Alcohol / Substance Use: Not Applicable Psych Involvement: No (comment)  Admission diagnosis:  MSSA bacteremia [R78.81, B95.61] Patient Active Problem List   Diagnosis Date Noted   COVID-19 08/12/22 completed Paxlovid 08/20/22 08/26/2022   Acute endocarditis 08/24/2022   SOB (shortness of breath) 08/24/2022   Atrial fibrillation with rapid ventricular response ([place]) 08/24/2022   Sepsis ([place]) 08/23/2022   Persistent atrial fibrillation ([place]) 08/22/2022   Atrial fibrillation with controlled ventricular rate ([place]) 08/22/2022   Type 2 diabetes mellitus with foot ulcer, without long-term current use of insulin ([place]) 08/21/2022   Paroxysmal atrial fibrillation ([place]) 08/19/2022   MSSA bacteremia 08/16/2022 08/18/2022   Bacteremia 08/18/2022   Seizure disorder ([place]) 08/17/2022   Atrial fibrillation with RVR ([place]) 08/17/2022   Fall 08/17/2022   COVID-19 08/17/2022   Weakness 08/16/2022   Acute urinary retention 08/13/2022   History of seizure 79/48/0165   Acute metabolic encephalopathy 53/74/8270   Pneumonia due to COVID-19 virus 08/12/2022   AKI (acute kidney injury) ([place]) 08/12/2022   Rhabdomyolysis 08/12/2022   PND (post-nasal drip) 07/25/2022   Oral candida 07/15/2022   Sore throat 07/15/2022   Open wound of foot with complication, left, initial encounter 06/16/2022   Hx of excision of lamina of lumbar vertebra for decompression of spinal cord 03/20/2022   Anemia 02/28/2022   Dysuria 02/28/2022   Peripheral edema 02/28/2022   Recurrent major depressive disorder ([place]) 02/28/2022   Chronic headache 02/28/2022   Recurrent falls 08/25/2021   Memory difficulty 07/04/2021    Chronic toe ulcer, left, with fat layer exposed ([place]) 01/23/2021   Pituitary adenoma ([place]) 09/06/2020   Chronic intractable headache 07/27/2020   Vitamin B12 deficiency 03/31/2020   Vitamin D deficiency 03/31/2020   Numbness of foot 03/25/2020   Status post amputation of great toe, left ([place]) 12/14/2019   Callus of foot 04/02/2019   Allergy to bee sting 03/11/2019   Insomnia 02/10/2019   Status post lumbar spine operative procedure for decompression of spinal cord 12/12/2018   Dyslipidemia 10/19/2018   Foot deformity 10/19/2018   Status post reverse total replacement of right shoulder 05/13/2018   Chronic diastolic CHF (congestive heart failure) ([place]) 12/27/2017   OSA (obstructive sleep apnea) 12/27/2017   CKD (chronic kidney disease) stage 3, GFR 30-59 ml/min (HCC) 10/01/2017   Fatty liver 10/01/2017   Status post reverse total replacement of left shoulder 09/03/2017   Bilateral shoulder pain 05/24/2017   History of CVA in adulthood 03/08/2017   S/P total knee arthroplasty, left 01/30/2017   Osteoarthritis of knees, bilateral 01/15/2017   Obesity, morbid, BMI 40.0-49.9 ([place]) 07/19/2016   Hypothyroidism    Iron deficiency 04/23/2016   Chronic pain syndrome 04/23/2016   Inflammatory arthritis 04/23/2016   Chronic leg pain 04/23/2016   Lesion of palate 04/23/2016   Grieving 04/23/2016   GERD (gastroesophageal reflux disease)    Duodenitis determined by biopsy 02/13/2016   Diverticulosis of colon without hemorrhage 78/67/5449   Complicated grief 20/07/711   Migraine 01/11/2012   History of methicillin resistant staphylococcus aureus (MRSA)  07/04/2010   DDD (degenerative disc disease), lumbar 07/04/2010   History of total right knee replacement 07/04/2010   Contact dermatitis 07/04/2010   Lesion of ulnar nerve 07/04/2010   Closed fracture of malar bone ([place]) 07/04/2010   Methicillin susceptible Staphylococcus aureus infection 07/04/2010   Narrowing of intervertebral disc space  07/04/2010   Olecranon bursitis 07/04/2010   History of artificial joint 07/04/2010   Primary osteoarthritis, right shoulder 06/09/2007   Osteoarthritis 06/09/2007   Personal history of colonic adenoma 06/01/2003   PCP:  [name], MD Pharmacy:  No Pharmacies Listed    Social Determinants of Health (SDOH) Interventions    Readmission Risk Interventions     No data to display

## 2022-08-28 DIAGNOSIS — R1031 Right lower quadrant pain: Secondary | ICD-10-CM | POA: Diagnosis not present

## 2022-08-28 DIAGNOSIS — R509 Fever, unspecified: Secondary | ICD-10-CM | POA: Diagnosis not present

## 2022-08-28 DIAGNOSIS — M19011 Primary osteoarthritis, right shoulder: Secondary | ICD-10-CM | POA: Diagnosis not present

## 2022-08-28 DIAGNOSIS — R338 Other retention of urine: Secondary | ICD-10-CM | POA: Diagnosis not present

## 2022-08-28 DIAGNOSIS — R300 Dysuria: Secondary | ICD-10-CM | POA: Diagnosis not present

## 2022-08-28 DIAGNOSIS — R29898 Other symptoms and signs involving the musculoskeletal system: Secondary | ICD-10-CM | POA: Diagnosis not present

## 2022-08-28 DIAGNOSIS — Z86010 Personal history of colonic polyps: Secondary | ICD-10-CM | POA: Diagnosis not present

## 2022-08-28 DIAGNOSIS — R0602 Shortness of breath: Secondary | ICD-10-CM | POA: Diagnosis not present

## 2022-08-28 DIAGNOSIS — M25561 Pain in right knee: Secondary | ICD-10-CM | POA: Diagnosis not present

## 2022-08-28 DIAGNOSIS — E039 Hypothyroidism, unspecified: Secondary | ICD-10-CM | POA: Diagnosis not present

## 2022-08-28 DIAGNOSIS — N390 Urinary tract infection, site not specified: Secondary | ICD-10-CM | POA: Diagnosis not present

## 2022-08-28 DIAGNOSIS — R0689 Other abnormalities of breathing: Secondary | ICD-10-CM | POA: Diagnosis not present

## 2022-08-28 DIAGNOSIS — R7303 Prediabetes: Secondary | ICD-10-CM | POA: Diagnosis not present

## 2022-08-28 DIAGNOSIS — Q2112 Patent foramen ovale: Secondary | ICD-10-CM | POA: Diagnosis not present

## 2022-08-28 DIAGNOSIS — Z89412 Acquired absence of left great toe: Secondary | ICD-10-CM | POA: Diagnosis not present

## 2022-08-28 DIAGNOSIS — R262 Difficulty in walking, not elsewhere classified: Secondary | ICD-10-CM | POA: Diagnosis not present

## 2022-08-28 DIAGNOSIS — G44229 Chronic tension-type headache, not intractable: Secondary | ICD-10-CM | POA: Diagnosis not present

## 2022-08-28 DIAGNOSIS — S39013D Strain of muscle, fascia and tendon of pelvis, subsequent encounter: Secondary | ICD-10-CM | POA: Diagnosis not present

## 2022-08-28 DIAGNOSIS — R110 Nausea: Secondary | ICD-10-CM | POA: Diagnosis not present

## 2022-08-28 DIAGNOSIS — B9561 Methicillin susceptible Staphylococcus aureus infection as the cause of diseases classified elsewhere: Secondary | ICD-10-CM | POA: Diagnosis not present

## 2022-08-28 DIAGNOSIS — R5381 Other malaise: Secondary | ICD-10-CM | POA: Diagnosis not present

## 2022-08-28 DIAGNOSIS — N1831 Chronic kidney disease, stage 3a: Secondary | ICD-10-CM | POA: Diagnosis not present

## 2022-08-28 DIAGNOSIS — R410 Disorientation, unspecified: Secondary | ICD-10-CM | POA: Diagnosis not present

## 2022-08-28 DIAGNOSIS — R0981 Nasal congestion: Secondary | ICD-10-CM | POA: Diagnosis not present

## 2022-08-28 DIAGNOSIS — M6281 Muscle weakness (generalized): Secondary | ICD-10-CM | POA: Diagnosis not present

## 2022-08-28 DIAGNOSIS — I482 Chronic atrial fibrillation, unspecified: Secondary | ICD-10-CM | POA: Diagnosis not present

## 2022-08-28 DIAGNOSIS — G40909 Epilepsy, unspecified, not intractable, without status epilepticus: Secondary | ICD-10-CM | POA: Diagnosis not present

## 2022-08-28 DIAGNOSIS — R5383 Other fatigue: Secondary | ICD-10-CM | POA: Diagnosis not present

## 2022-08-28 DIAGNOSIS — E8809 Other disorders of plasma-protein metabolism, not elsewhere classified: Secondary | ICD-10-CM | POA: Diagnosis not present

## 2022-08-28 DIAGNOSIS — M79672 Pain in left foot: Secondary | ICD-10-CM | POA: Diagnosis not present

## 2022-08-28 DIAGNOSIS — I5032 Chronic diastolic (congestive) heart failure: Secondary | ICD-10-CM | POA: Diagnosis not present

## 2022-08-28 DIAGNOSIS — K219 Gastro-esophageal reflux disease without esophagitis: Secondary | ICD-10-CM | POA: Diagnosis not present

## 2022-08-28 DIAGNOSIS — M25531 Pain in right wrist: Secondary | ICD-10-CM | POA: Diagnosis not present

## 2022-08-28 DIAGNOSIS — D509 Iron deficiency anemia, unspecified: Secondary | ICD-10-CM | POA: Diagnosis not present

## 2022-08-28 DIAGNOSIS — R0989 Other specified symptoms and signs involving the circulatory and respiratory systems: Secondary | ICD-10-CM | POA: Diagnosis not present

## 2022-08-28 DIAGNOSIS — E119 Type 2 diabetes mellitus without complications: Secondary | ICD-10-CM | POA: Diagnosis not present

## 2022-08-28 DIAGNOSIS — L97521 Non-pressure chronic ulcer of other part of left foot limited to breakdown of skin: Secondary | ICD-10-CM | POA: Diagnosis not present

## 2022-08-28 DIAGNOSIS — L97509 Non-pressure chronic ulcer of other part of unspecified foot with unspecified severity: Secondary | ICD-10-CM | POA: Diagnosis not present

## 2022-08-28 DIAGNOSIS — I4819 Other persistent atrial fibrillation: Secondary | ICD-10-CM | POA: Diagnosis not present

## 2022-08-28 DIAGNOSIS — M62838 Other muscle spasm: Secondary | ICD-10-CM | POA: Diagnosis not present

## 2022-08-28 DIAGNOSIS — F32A Depression, unspecified: Secondary | ICD-10-CM | POA: Diagnosis not present

## 2022-08-28 DIAGNOSIS — J45909 Unspecified asthma, uncomplicated: Secondary | ICD-10-CM | POA: Diagnosis not present

## 2022-08-28 DIAGNOSIS — N183 Chronic kidney disease, stage 3 unspecified: Secondary | ICD-10-CM | POA: Diagnosis not present

## 2022-08-28 DIAGNOSIS — Z7401 Bed confinement status: Secondary | ICD-10-CM | POA: Diagnosis not present

## 2022-08-28 DIAGNOSIS — J811 Chronic pulmonary edema: Secondary | ICD-10-CM | POA: Diagnosis not present

## 2022-08-28 DIAGNOSIS — M170 Bilateral primary osteoarthritis of knee: Secondary | ICD-10-CM | POA: Diagnosis not present

## 2022-08-28 DIAGNOSIS — Z8673 Personal history of transient ischemic attack (TIA), and cerebral infarction without residual deficits: Secondary | ICD-10-CM | POA: Diagnosis not present

## 2022-08-28 DIAGNOSIS — R051 Acute cough: Secondary | ICD-10-CM | POA: Diagnosis not present

## 2022-08-28 DIAGNOSIS — Z8616 Personal history of COVID-19: Secondary | ICD-10-CM | POA: Diagnosis not present

## 2022-08-28 DIAGNOSIS — I5023 Acute on chronic systolic (congestive) heart failure: Secondary | ICD-10-CM | POA: Diagnosis not present

## 2022-08-28 DIAGNOSIS — R52 Pain, unspecified: Secondary | ICD-10-CM | POA: Diagnosis not present

## 2022-08-28 DIAGNOSIS — S39013A Strain of muscle, fascia and tendon of pelvis, initial encounter: Secondary | ICD-10-CM | POA: Diagnosis not present

## 2022-08-28 DIAGNOSIS — M25431 Effusion, right wrist: Secondary | ICD-10-CM | POA: Diagnosis not present

## 2022-08-28 DIAGNOSIS — Z79899 Other long term (current) drug therapy: Secondary | ICD-10-CM | POA: Diagnosis not present

## 2022-08-28 DIAGNOSIS — R7881 Bacteremia: Secondary | ICD-10-CM | POA: Diagnosis not present

## 2022-08-28 DIAGNOSIS — I339 Acute and subacute endocarditis, unspecified: Secondary | ICD-10-CM | POA: Diagnosis not present

## 2022-08-28 DIAGNOSIS — J302 Other seasonal allergic rhinitis: Secondary | ICD-10-CM | POA: Diagnosis not present

## 2022-08-28 DIAGNOSIS — D649 Anemia, unspecified: Secondary | ICD-10-CM | POA: Diagnosis not present

## 2022-08-28 DIAGNOSIS — L97523 Non-pressure chronic ulcer of other part of left foot with necrosis of muscle: Secondary | ICD-10-CM | POA: Diagnosis not present

## 2022-08-28 DIAGNOSIS — Z96612 Presence of left artificial shoulder joint: Secondary | ICD-10-CM | POA: Diagnosis not present

## 2022-08-28 DIAGNOSIS — E7800 Pure hypercholesterolemia, unspecified: Secondary | ICD-10-CM | POA: Diagnosis not present

## 2022-08-28 MED ORDER — CLOTRIMAZOLE 1 % EX CREA: 1.0000 | TOPICAL_CREAM | Freq: Two times a day (BID) | CUTANEOUS | Status: DC | PRN

## 2022-08-28 MED ORDER — METOPROLOL SUCCINATE ER 100 MG PO TB24: 100.0000 mg | ORAL_TABLET | Freq: Every day | ORAL | Status: DC

## 2022-08-28 MED ORDER — CEFAZOLIN IV (FOR PTA / DISCHARGE USE ONLY): 2.0000 g | Freq: Three times a day (TID) | INTRAVENOUS | 0 refills | Status: AC

## 2022-08-28 NOTE — Progress Notes (Signed)
PHARMACY CONSULT NOTE FOR:  OUTPATIENT  PARENTERAL ANTIBIOTIC THERAPY (OPAT)  Indication: MSSA bacteremia Regimen: Cefazolin 2 g IV q8H through 09/01/2022. Then on 09/02/2022 start linezolid 600 mg PO q12H through 09/16/2022 End date: 09/01/2022 for cefazolin, and 09/16/2022 for linezolid  IV antibiotic discharge orders are pended. To discharging provider:  please sign these orders via discharge navigator,  Select New Orders & click on the button choice - Manage This Unsigned Work.    Thank you for allowing pharmacy to be a part of this patient's care.  Doreene Eland, PharmD, BCPS, BCIDP Work Cell: [phone] 08/28/2022 11:59 AM

## 2022-08-28 NOTE — Consult Note (Addendum)
[facility] CM Inpatient Consult   08/28/2022  ADNREW HONKALA [birth year] [id number]  [facility] [[facility]]  Accountable Care Organization [ACO] Patient: [employer] Medicare  [facility] Liaison remote review for patient at corrected [facility]]  - was at [facility]   Primary Care Provider:  [name], MD with [place] [facility]  [facility] is listed to provide the transition of care [TOC] follow up   Patient was reviewed for less than 7 days readmission high risk score and to transition to a skilled nursing facility level of care was from home with [facility], IV home infusion with dificulty noted.  Review for patient to transition to a skilled nursing facility level of care where patient can be followed by [facility] Care Management PAC RN with traditional Medicare and approved Medicare Advantage plans. Patient's electronic medical record reveals patient transitioned to [facility].  Plan:   Notify [facility] RN who can follow for any known or needs for transitional care needs for returning to post facility care coordination needs to return to community.  For questions or referrals, please contact:   Charlesetta Shanks, RN BSN CCM [facility]  [phone] business mobile phone Toll free office [phone]  *Concierge Line  [phone] Fax number: [phone] [email or web address]

## 2022-08-28 NOTE — Discharge Summary (Signed)
Physician Discharge Summary   Patient: Nicholas [name] MRN: [id number] DOB: [birth year]  Admit date:     08/26/2022  Discharge date: 08/28/22  Discharge Physician: Jennye Boroughs   PCP: [name], MD   Recommendations at discharge:   Follow-up with PCP in 1 to 2 weeks Follow-up with physician at the next home within 3 days of discharge Follow-up with Dr. Jule Ser, infectious disease specialist, on 09/10/2022 as scheduled  Discharge Diagnoses: Principal Problem:   MSSA bacteremia 08/16/2022 Active Problems:   Chronic diastolic CHF (congestive heart failure) ([place])   Hypothyroidism   Chronic headache   Seizure disorder ([place])   Persistent atrial fibrillation ([place])   COVID-19 08/12/22 completed Paxlovid 08/20/22  Resolved Problems:   * No resolved hospital problems. [facility] Course:  Mr. RALSTON VENUS is a 71 y.o. male with medical history significant for Diastolic CHF, hypothyroidism, , seizure disorder, Afib on Eliquis s/p cardioversion 08/24/2022, recent COVID-19 08/12/2022 (completed Paxlovid on 11/6), recently hospitalized from 11/2 to 11/11 for MSSA bacteremia after presenting with weakness and falls, discharged 1 day ago on cefazolin IV 2 g Q8 through 09/01/2022 followed by linezolid 600 p.o. twice daily 11/19 to 12/3 who was sent back to the ED due to difficulty self administering his IV cefazolin following his discharge.  He has received 1 dose of the cefazolin since discharge.  Family called the discharging physician and he was instructed to return.  Hospitalist team was consulted to admit patient for IV antibiotics for MSSA bacteremia.   He was evaluated by PT and OT who recommended further rehabilitation at skilled nursing facility.  He is medically stable for discharge to SNF today.  Discharge plan was discussed with [name], son, over the phone.   Assessment and Plan:  * MSSA bacteremia 08/16/2022 Recent TEE was negative for vegetations Continue cefazolin 2  g Q8  through 11/18 followed by oral linezolid 11/19 to 09/16/2022    Chronic diastolic CHF (congestive heart failure) ([place]) Compensated Continue metoprolol and furosemide   Hypothyroidism Continue levothyroxine   COVID-19 08/12/22 completed Paxlovid 08/20/22 Asymptomatic Off airborne precautions   History of persistent atrial fibrillation ([facility]) S/p TEE cardioversion for RVR on 08/24/2022 Patient currently in sinus rhythm Continue apixaban and metoprolol   Seizure disorder ([facility]) Continue Depakote and Neurontin   Chronic headache He is on monthly Ajovy            Consultants: None Procedures performed: None  Disposition: Skilled nursing facility Diet recommendation:  Discharge Diet Orders (From admission, onward)     Start     Ordered   08/28/22 0000  Diet - low sodium heart healthy        08/28/22 1034           Cardiac diet DISCHARGE MEDICATION: Allergies as of 08/28/2022       Reactions   Peanut-containing Drug Products Anaphylaxis, Dermatitis   Yellow Jacket Venom [bee Venom] Anaphylaxis, Other (See Comments)   Respiratory Distress   Celebrex [celecoxib] Other (See Comments)   BLACK STOOL?MELENA?   Percocet [oxycodone-acetaminophen] Hives, Swelling, Other (See Comments)   Latex Rash   Nsaids Rash        Medication List     STOP taking these medications    ketoconazole 2 % shampoo Commonly known as: Nizoral   nystatin 100000 UNIT/ML suspension Commonly known as: MYCOSTATIN       TAKE these medications    acetaminophen 500 MG tablet Commonly known as: TYLENOL Take 1,000 mg  by mouth 3 (three) times daily.   Ajovy 225 MG/1.5ML Soaj Generic drug: Fremanezumab-vfrm Inject 225 mg into the skin every 30 (thirty) days.   amitriptyline 50 MG tablet Commonly known as: ELAVIL TAKE TWO TABLETS BY MOUTH EVERYDAY AT BEDTIME What changed: See the new instructions.   apixaban 5 MG Tabs tablet Commonly known as: ELIQUIS Take 1 tablet (5  mg total) by mouth 2 (two) times daily.   atorvastatin 20 MG tablet Commonly known as: LIPITOR TAKE ONE TABLET BY MOUTH ONCE DAILY   B-COMPLEX/B-12 PO Take 1 tablet by mouth daily.   ceFAZolin  IVPB Commonly known as: ANCEF Inject 2 g into the vein every 8 (eight) hours for 8 days. Indication: MSSA bacteremia Last Day of Therapy: 09/01/2022 Labs - Once weekly: CBC/D and BMP Method of administration: IV Push Method of administration may be changed at the discretion of infusion pharmacist based upon assessment of the patient and/or caregiver's ability to self-administer the medication ordered. Midline Care Per Protocol Please pull midline at completion of IV antibiotics Fax weekly labs to [phone] What changed: additional instructions   cetirizine 10 MG tablet Commonly known as: ZYRTEC Take 1 tablet (10 mg total) by mouth daily.   clotrimazole 1 % cream Commonly known as: LOTRIMIN Apply 1 Application topically 2 (two) times daily as needed (rash).   divalproex 500 MG 24 hr tablet Commonly known as: DEPAKOTE ER TAKE ONE TABLET BY MOUTH EVERYDAY AT BEDTIME   EPINEPHrine 0.3 mg/0.3 mL Soaj injection Commonly known as: EPI-PEN Inject 0.3 mg into the muscle as needed for anaphylaxis. (bee stings)   ferrous sulfate 325 (65 FE) MG EC tablet Take 325 mg by mouth daily with breakfast.   fluticasone 50 MCG/ACT nasal spray Commonly known as: FLONASE Place 2 sprays into both nostrils daily.   furosemide 20 MG tablet Commonly known as: LASIX TAKE ONE TABLET BY MOUTH EVERY MORNING   gabapentin 300 MG capsule Commonly known as: NEURONTIN TAKE ONE CAPSULE BY MOUTH EVERY MORNING and TAKE TWO CAPSULES BY MOUTH AT NOON and TAKE TWO CAPSULES BY MOUTH EVERY EVENING   levothyroxine 112 MCG tablet Commonly known as: SYNTHROID Take 1 tablet (112 mcg total) by mouth daily before breakfast.   linezolid 600 MG tablet Commonly known as: ZYVOX Take 1 tablet (600 mg total) by mouth 2  (two) times daily for 14 days. Start taking on: September 02, 2022   methocarbamol 500 MG tablet Commonly known as: ROBAXIN TAKE ONE TABLET BY MOUTH twice daily AS NEEDED FOR muscle SPASMS   metoprolol succinate 100 MG 24 hr tablet Commonly known as: TOPROL-XL Take 1 tablet (100 mg total) by mouth at bedtime. Take with or immediately following a meal. What changed: See the new instructions.   nitroGLYCERIN 0.4 MG SL tablet Commonly known as: NITROSTAT Place 1 tablet (0.4 mg total) under the tongue every 5 (five) minutes as needed for chest pain.   ondansetron 4 MG disintegrating tablet Commonly known as: ZOFRAN-ODT TAKE ONE TABLET BY MOUTH EVERY 8 HOURS AS NEEDED   pantoprazole 40 MG tablet Commonly known as: PROTONIX Take 1 tablet (40 mg total) by mouth every other day.   tamsulosin 0.4 MG Caps capsule Commonly known as: FLOMAX Take 1 capsule (0.4 mg total) by mouth daily.   tiZANidine 4 MG tablet Commonly known as: ZANAFLEX TAKE ONE TABLET BY MOUTH every SIX hours AS NEEDED FOR acute HEADACHE   Ubrelvy 100 MG Tabs Generic drug: Ubrogepant Take 100 mg by mouth  as needed (take 1 tablet at onset of headache, may repeat in hours, max is 200 mg in 24 hours).   Vitamin D3 25 MCG (1000 UT) Caps Take 1 capsule (1,000 Units total) by mouth daily.        Contact information for follow-up providers     Mignon Pine, DO Follow up on 09/10/2022.   Specialties: Infectious Diseases, Internal Medicine Why: at 3:30 pm Contact information: [place] [place] [phone]              Contact information for after-discharge care     [facility] .   Service: Skilled Nursing Contact information: [address] [place] [phone]                    Discharge Exam: Danley Danker Weights   08/27/22 1730  Weight: 113.2 kg   GEN: NAD SKIN: Warm and  dry EYES: EOMI ENT: MMM CV: RRR PULM: CTA B ABD: soft, ND, NT, +BS CNS: AAO x 3, non focal EXT: No edema or tenderness   Condition at discharge: good  The results of significant diagnostics from this hospitalization (including imaging, microbiology, ancillary and laboratory) are listed below for reference.   Imaging Studies: ECHO TEE  Result Date: 08/24/2022    TRANSESOPHOGEAL ECHO REPORT   Patient Name:   KEONTAE LEVINGSTON Date of Exam: 08/24/2022 Medical Rec #:  [id number]      Height:       68.0 in Accession #:    [id number]     Weight:       249.6 lb Date of Birth:  1950/12/14       BSA:          2.246 m Patient Age:    73 years       BP:           98/68 mmHg Patient Gender: M              HR:           79 bpm. Exam Location:  [facility] Procedure: Transesophageal Echo, Cardiac Doppler, Color Doppler and Saline            Contrast Bubble Study Indications:     SBE ( Subacute bacterial endocarditis) I33.0  History:         Patient has prior history of Echocardiogram examinations, most                  recent 08/20/2022. CHF. Heart attack, PFO.  Sonographer:     Sherrie Sport Referring Phys:  [place] Diagnosing Phys: Ida Rogue MD PROCEDURE: After discussion of the risks and benefits of a TEE, an informed consent was obtained from the patient. TEE procedure time was 30 minutes. The transesophogeal probe was passed without difficulty through the esophogus of the patient. Local oropharyngeal anesthetic was provided with Cetacaine and viscous lidocaine. Sedation performed by different physician. The patient's vital signs; including heart rate, blood pressure, and oxygen saturation; remained stable throughout the procedure. The patient developed no complications during the procedure. A direct current cardioversion was performed at 150 joules with 1 attempt.  IMPRESSIONS  1. Left ventricular ejection fraction, by estimation, is 60 to 65%. The left ventricle has normal function. The left ventricle  has no regional wall motion abnormalities.  2. Right ventricular systolic function is normal. The right ventricular size  is normal.  3. No left atrial/left atrial appendage thrombus was detected.  4. The mitral valve is normal in structure. Mild to moderate mitral valve regurgitation. No evidence of mitral stenosis.  5. The aortic valve is normal in structure. Aortic valve regurgitation is mild. No aortic stenosis is present.  6. The inferior vena cava is normal in size with greater than 50% respiratory variability, suggesting right atrial pressure of 3 mmHg.  7. Agitated saline contrast bubble study was negative, with no evidence of any interatrial shunt  8. No valve vegetation noted. Conclusion(s)/Recommendation(s): Normal biventricular function without evidence of hemodynamically significant valvular heart disease. FINDINGS  Left Ventricle: Left ventricular ejection fraction, by estimation, is 60 to 65%. The left ventricle has normal function. The left ventricle has no regional wall motion abnormalities. The left ventricular internal cavity size was normal in size. There is  no left ventricular hypertrophy. Right Ventricle: The right ventricular size is normal. No increase in right ventricular wall thickness. Right ventricular systolic function is normal. Left Atrium: Left atrial size was normal in size. No left atrial/left atrial appendage thrombus was detected. Right Atrium: Right atrial size was normal in size. Pericardium: There is no evidence of pericardial effusion. Mitral Valve: The mitral valve is normal in structure. Mild to moderate mitral valve regurgitation. No evidence of mitral valve stenosis. There is no evidence of mitral valve vegetation. Tricuspid Valve: The tricuspid valve is normal in structure. Tricuspid valve regurgitation is not demonstrated. No evidence of tricuspid stenosis. There is no evidence of tricuspid valve vegetation. Aortic Valve: The aortic valve is normal in structure. Aortic  valve regurgitation is mild. No aortic stenosis is present. There is no evidence of aortic valve vegetation. Pulmonic Valve: The pulmonic valve was normal in structure. Pulmonic valve regurgitation is not visualized. No evidence of pulmonic stenosis. There is no evidence of pulmonic valve vegetation. Aorta: The aortic root is normal in size and structure. Venous: The inferior vena cava is normal in size with greater than 50% respiratory variability, suggesting right atrial pressure of 3 mmHg. IAS/Shunts: No atrial level shunt detected by color flow Doppler. Agitated saline contrast was given intravenously to evaluate for intracardiac shunting. Agitated saline contrast bubble study was negative, with no evidence of any interatrial shunt. Ida Rogue MD Electronically signed by Ida Rogue MD Signature Date/Time: 08/24/2022/3:43:10 PM    Final    ECHOCARDIOGRAM COMPLETE  Result Date: 08/20/2022    ECHOCARDIOGRAM REPORT   Patient Name:   MONTE ZINNI Date of Exam: 08/20/2022 Medical Rec #:  [id number]      Height:       68.0 in Accession #:    [id number]     Weight:       235.0 lb Date of Birth:  September 16, 1951       BSA:          2.189 m Patient Age:    89 years       BP:           121/79 mmHg Patient Gender: M              HR:           72 bpm. Exam Location:  [facility] Procedure: 2D Echo and Intracardiac Opacification Agent Indications:     Atrial Fibrillation I48.91  History:         Patient has prior history of Echocardiogram examinations, most  recent 01/09/2018. CHF; CAD and Previous Myocardial Infarction.  Sonographer:     Mikki Santee RDCS Referring Phys:  [id number] [place] Diagnosing Phys: Kathlyn Sacramento MD IMPRESSIONS  1. Left ventricular ejection fraction, by estimation, is 55 to 60%. The left ventricle has normal function. Left ventricular endocardial border not optimally defined to evaluate regional wall motion. Left ventricular diastolic parameters are indeterminate.  2. Right  ventricular systolic function is normal. The right ventricular size is normal.  3. Left atrial size was mildly dilated.  4. The mitral valve is normal in structure. Moderate mitral valve regurgitation. No evidence of mitral stenosis.  5. The aortic valve is normal in structure. Aortic valve regurgitation is mild. No aortic stenosis is present.  6. Aortic dilatation noted. There is mild dilatation of the ascending aorta, measuring 42 mm. FINDINGS  Left Ventricle: Left ventricular ejection fraction, by estimation, is 55 to 60%. The left ventricle has normal function. Left ventricular endocardial border not optimally defined to evaluate regional wall motion. Definity contrast agent was given IV to delineate the left ventricular endocardial borders. The left ventricular internal cavity size was normal in size. There is no left ventricular hypertrophy. Left ventricular diastolic parameters are indeterminate. Right Ventricle: The right ventricular size is normal. No increase in right ventricular wall thickness. Right ventricular systolic function is normal. Left Atrium: Left atrial size was mildly dilated. Right Atrium: Right atrial size was normal in size. Pericardium: There is no evidence of pericardial effusion. Mitral Valve: The mitral valve is normal in structure. Moderate mitral valve regurgitation. No evidence of mitral valve stenosis. Tricuspid Valve: The tricuspid valve is normal in structure. Tricuspid valve regurgitation is trivial. No evidence of tricuspid stenosis. Aortic Valve: The aortic valve is normal in structure. Aortic valve regurgitation is mild. No aortic stenosis is present. Pulmonic Valve: The pulmonic valve was normal in structure. Pulmonic valve regurgitation is not visualized. No evidence of pulmonic stenosis. Aorta: Aortic dilatation noted. There is mild dilatation of the ascending aorta, measuring 42 mm. Venous: The inferior vena cava was not well visualized. IAS/Shunts: No atrial level shunt  detected by color flow Doppler.  LEFT VENTRICLE PLAX 2D LVIDd:         5.20 cm LVIDs:         3.70 cm LV PW:         1.00 cm LV IVS:        0.90 cm LVOT diam:     2.50 cm LV SV:         67 LV SV Index:   30 LVOT Area:     4.91 cm  RIGHT VENTRICLE RV Basal diam:  3.00 cm RV Mid diam:    3.10 cm RV S prime:     11.20 cm/s TAPSE (M-mode): 1.6 cm LEFT ATRIUM             Index        RIGHT ATRIUM           Index LA diam:        4.20 cm 1.92 cm/m   RA Area:     10.70 cm LA Vol (A2C):   60.1 ml 27.46 ml/m  RA Volume:   20.40 ml  9.32 ml/m LA Vol (A4C):   48.3 ml 22.07 ml/m LA Biplane Vol: 57.5 ml 26.27 ml/m  AORTIC VALVE LVOT Vmax:   70.35 cm/s LVOT Vmean:  44.950 cm/s LVOT VTI:    0.136 m  AORTA Ao Root diam: 3.80  cm MR Peak grad: 102.8 mmHg  TRICUSPID VALVE MR Vmax:      507.00 cm/s TR Peak grad:   18.8 mmHg                           TR Vmax:        217.00 cm/s                            SHUNTS                           Systemic VTI:  0.14 m                           Systemic Diam: 2.50 cm Kathlyn Sacramento MD Electronically signed by Kathlyn Sacramento MD Signature Date/Time: 08/20/2022/12:45:25 PM    Final    CT Angio Chest Pulmonary Embolism (PE) W or WO Contrast  Result Date: 08/17/2022 CLINICAL DATA:  Right-sided chest pain. Bruising along the left side of the chest. Hypotension. EXAM: CT ANGIOGRAPHY CHEST WITH CONTRAST TECHNIQUE: Multidetector CT imaging of the chest was performed using the standard protocol during bolus administration of intravenous contrast. Multiplanar CT image reconstructions and MIPs were obtained to evaluate the vascular anatomy. RADIATION DOSE REDUCTION: This exam was performed according to the departmental dose-optimization program which includes automated exposure control, adjustment of the mA and/or kV according to patient size and/or use of iterative reconstruction technique. CONTRAST:  37m OMNIPAQUE IOHEXOL 350 MG/ML SOLN COMPARISON:  Chest radiograph 08/16/2022 and chest CT  11/01/2016 FINDINGS: Despite efforts by the technologist and patient, motion artifact is present on today's exam and could not be eliminated. This reduces exam sensitivity and specificity. Cardiovascular: No filling defect is identified in the pulmonary arterial tree to suggest pulmonary embolus. Atherosclerotic calcification of the aortic arch. Mild cardiomegaly. Suspected left anterior descending coronary artery atherosclerotic calcification. Poor opacification of the systemic vasculature including the aorta. Mediastinum/Nodes: Unremarkable Lungs/Pleura: Nonspecific ground-glass opacities favoring the lung bases potentially reflecting edema or air trapping. More confluent airspace opacity in the right lower lobe, pneumonia not excluded. There is airway thickening in the right lower lobe and peripheral atelectasis in the posterior basal segment left lower lobe. Upper Abdomen: Fluid density Bosniak category 1 cyst of the right mid kidney medially, requiring no follow up. Hypodense lesion of the right kidney upper pole anteriorly on image 143 series 4 has an internal density of 16 Hounsfield units, favoring a benign Bosniak category 2 cyst. No further imaging workup of this lesion is indicated. Musculoskeletal: Bilateral total shoulder arthroplasties. Thoracic spondylosis. Review of the MIP images confirms the above findings. IMPRESSION: 1. No filling defect is identified in the pulmonary arterial tree to suggest pulmonary embolus. 2. Mild cardiomegaly with suspected left anterior descending coronary artery atherosclerotic calcification. 3. Nonspecific ground-glass opacities favoring the lung bases probably reflect mild pulmonary edema. 4. More confluent airspace opacity in the right lower lobe, pneumonia not excluded. 5. Airway thickening in the right lower lobe, query bronchitis or reactive airways disease. 6. Aortic atherosclerosis. Aortic Atherosclerosis (ICD10-I70.0). Electronically Signed   By: WVan ClinesM.D.   On: 08/17/2022 15:34   DG Chest Portable 1 View  Result Date: 08/16/2022 CLINICAL DATA:  Shortness of breath EXAM: PORTABLE CHEST 1 VIEW COMPARISON:  08/12/2022 FINDINGS: Bilateral shoulder replacements. Cardiomegaly with central congestion. Patchy atelectasis  left base. No pleural effusion or pneumothorax. IMPRESSION: Cardiomegaly with mild central congestion, overall appearance improved since 08/12/2022. Patchy atelectasis left base. Electronically Signed   By: Donavan Foil M.D.   On: 08/16/2022 22:26   CT Head Wo Contrast  Result Date: 08/12/2022 CLINICAL DATA:  71 year old male with history of trauma from a fall 1 week ago. EXAM: CT HEAD WITHOUT CONTRAST CT CERVICAL SPINE WITHOUT CONTRAST TECHNIQUE: Multidetector CT imaging of the head and cervical spine was performed following the standard protocol without intravenous contrast. Multiplanar CT image reconstructions of the cervical spine were also generated. RADIATION DOSE REDUCTION: This exam was performed according to the departmental dose-optimization program which includes automated exposure control, adjustment of the mA and/or kV according to patient size and/or use of iterative reconstruction technique. COMPARISON:  Head and cervical spine CT 07/02/2022. FINDINGS: CT HEAD FINDINGS Brain: No evidence of acute infarction, hemorrhage, hydrocephalus, extra-axial collection or mass lesion/mass effect. Vascular: No hyperdense vessel or unexpected calcification. Skull: Normal. Negative for fracture or focal lesion. Sinuses/Orbits: No acute finding. Other: None. CT CERVICAL SPINE FINDINGS Alignment: Normal. Skull base and vertebrae: No acute fracture. No primary bone lesion or focal pathologic process. Soft tissues and spinal canal: No prevertebral fluid or swelling. No visible canal hematoma. Disc levels: Multilevel degenerative disc disease, most pronounced at C4-C5, C5-C6 and C6-C7. Severe multilevel facet arthropathy bilaterally.  Upper chest: Negative. Other: None. IMPRESSION: 1. No evidence of significant acute traumatic injury to the skull, brain or cervical spine. 2. The appearance of the brain is normal. 3. Severe multilevel degenerative disc disease and cervical spondylosis, as above. Electronically Signed   By: Vinnie Langton M.D.   On: 08/12/2022 09:55   CT Cervical Spine Wo Contrast  Result Date: 08/12/2022 CLINICAL DATA:  71 year old male with history of trauma from a fall 1 week ago. EXAM: CT HEAD WITHOUT CONTRAST CT CERVICAL SPINE WITHOUT CONTRAST TECHNIQUE: Multidetector CT imaging of the head and cervical spine was performed following the standard protocol without intravenous contrast. Multiplanar CT image reconstructions of the cervical spine were also generated. RADIATION DOSE REDUCTION: This exam was performed according to the departmental dose-optimization program which includes automated exposure control, adjustment of the mA and/or kV according to patient size and/or use of iterative reconstruction technique. COMPARISON:  Head and cervical spine CT 07/02/2022. FINDINGS: CT HEAD FINDINGS Brain: No evidence of acute infarction, hemorrhage, hydrocephalus, extra-axial collection or mass lesion/mass effect. Vascular: No hyperdense vessel or unexpected calcification. Skull: Normal. Negative for fracture or focal lesion. Sinuses/Orbits: No acute finding. Other: None. CT CERVICAL SPINE FINDINGS Alignment: Normal. Skull base and vertebrae: No acute fracture. No primary bone lesion or focal pathologic process. Soft tissues and spinal canal: No prevertebral fluid or swelling. No visible canal hematoma. Disc levels: Multilevel degenerative disc disease, most pronounced at C4-C5, C5-C6 and C6-C7. Severe multilevel facet arthropathy bilaterally. Upper chest: Negative. Other: None. IMPRESSION: 1. No evidence of significant acute traumatic injury to the skull, brain or cervical spine. 2. The appearance of the brain is normal. 3.  Severe multilevel degenerative disc disease and cervical spondylosis, as above. Electronically Signed   By: Vinnie Langton M.D.   On: 08/12/2022 09:55   DG Chest Portable 1 View  Result Date: 08/12/2022 CLINICAL DATA:  71 year old male with history of weakness. EXAM: PORTABLE CHEST 1 VIEW COMPARISON:  Chest x-ray 07/02/2022. FINDINGS: Lung volumes are low. Bibasilar opacities which may reflect areas of atelectasis and/or consolidation, likely with superimposed small bilateral pleural effusions. No pneumothorax. Cephalization  of the pulmonary vasculature. Heart size appears mildly enlarged. Upper mediastinal contours are distorted by patient's rotation to the right. Status post bilateral shoulder arthroplasty. IMPRESSION: 1. The appearance of the chest could suggest very mild congestive heart failure, as above. Electronically Signed   By: Vinnie Langton M.D.   On: 08/12/2022 07:46    Microbiology: Results for orders placed or performed during the hospital encounter of 08/16/22  Blood culture (routine x 2)     Status: Abnormal   Collection Time: 08/16/22 10:02 PM   Specimen: BLOOD  Result Value Ref Range Status   Specimen Description   Final    BLOOD LEFT HAND Performed at [facility], [address] [place], [place] [address]    Special Requests   Final    BOTTLES DRAWN AEROBIC AND ANAEROBIC Blood Culture results may not be optimal due to an inadequate volume of blood received in culture bottles Performed at [facility], [address], [place], [place] [address]    Culture  Setup Time   Final    GRAM POSITIVE COCCI ANAEROBIC BOTTLE ONLY GRAM STAIN REVIEWED-AGREE WITH RESULT CRITICAL RESULT CALLED TO, READ BACK BY AND VERIFIED WITH: NATHAN BELUE AT 2323 08/17/2022 GA Performed at [facility], [address] [place], [place] [address]    Culture (A)  Final    STAPHYLOCOCCUS AUREUS SUSCEPTIBILITIES PERFORMED ON PREVIOUS CULTURE WITHIN THE LAST 5  DAYS. Performed at [facility], [place] [address], [place], [place] [address]    Report Status 08/20/2022 FINAL  Final  Blood culture (routine x 2)     Status: Abnormal   Collection Time: 08/17/22  2:54 AM   Specimen: BLOOD RIGHT ARM  Result Value Ref Range Status   Specimen Description   Final    BLOOD RIGHT ARM Performed at [facility], [address], [place], [place] [address]    Special Requests   Final    BOTTLES DRAWN AEROBIC AND ANAEROBIC Blood Culture results may not be optimal due to an inadequate volume of blood received in culture bottles Performed at [facility], [address] [place], [place] [address]    Culture  Setup Time   Final    GRAM POSITIVE COCCI AEROBIC BOTTLE ONLY CRITICAL RESULT CALLED TO, READ BACK BY AND VERIFIED WITH: PHARMD BRANDON BEERS AT [place] 08/17/2022 GAA Performed at [facility], [place] [address], [place], [place] [address]    Culture STAPHYLOCOCCUS AUREUS (A)  Final   Report Status 08/20/2022 FINAL  Final   Organism ID, Bacteria STAPHYLOCOCCUS AUREUS  Final      Susceptibility   Staphylococcus aureus - MIC*    CIPROFLOXACIN <=0.5 SENSITIVE Sensitive     ERYTHROMYCIN <=0.25 SENSITIVE Sensitive     GENTAMICIN <=0.5 SENSITIVE Sensitive     OXACILLIN 0.5 SENSITIVE Sensitive     TETRACYCLINE <=1 SENSITIVE Sensitive     VANCOMYCIN <=0.5 SENSITIVE Sensitive     TRIMETH/SULFA <=10 SENSITIVE Sensitive     CLINDAMYCIN <=0.25 SENSITIVE Sensitive     RIFAMPIN <=0.5 SENSITIVE Sensitive     Inducible Clindamycin NEGATIVE Sensitive     * STAPHYLOCOCCUS AUREUS  Blood Culture ID Panel (Reflexed)     Status: Abnormal   Collection Time: 08/17/22  2:54 AM  Result Value Ref Range Status   Enterococcus faecalis NOT DETECTED NOT DETECTED Final   Enterococcus Faecium NOT DETECTED NOT DETECTED Final   Listeria monocytogenes NOT DETECTED NOT DETECTED Final   Staphylococcus  species DETECTED (A) NOT DETECTED Final    Comment:  CRITICAL RESULT CALLED TO, READ BACK BY AND VERIFIED WITH: PHARMD BRANDON BEERS AT 1930 08/17/2022 GAA    Staphylococcus aureus (BCID) DETECTED (A) NOT DETECTED Final    Comment: CRITICAL RESULT CALLED TO, READ BACK BY AND VERIFIED WITH: PHARMD BRANDON BEERS AT 1930 08/17/2022 GAA    Staphylococcus epidermidis NOT DETECTED NOT DETECTED Final   Staphylococcus lugdunensis NOT DETECTED NOT DETECTED Final   Streptococcus species NOT DETECTED NOT DETECTED Final   Streptococcus agalactiae NOT DETECTED NOT DETECTED Final   Streptococcus pneumoniae NOT DETECTED NOT DETECTED Final   Streptococcus pyogenes NOT DETECTED NOT DETECTED Final   A.calcoaceticus-baumannii NOT DETECTED NOT DETECTED Final   Bacteroides fragilis NOT DETECTED NOT DETECTED Final   Enterobacterales NOT DETECTED NOT DETECTED Final   Enterobacter cloacae complex NOT DETECTED NOT DETECTED Final   Escherichia coli NOT DETECTED NOT DETECTED Final   Klebsiella aerogenes NOT DETECTED NOT DETECTED Final   Klebsiella oxytoca NOT DETECTED NOT DETECTED Final   Klebsiella pneumoniae NOT DETECTED NOT DETECTED Final   Proteus species NOT DETECTED NOT DETECTED Final   Salmonella species NOT DETECTED NOT DETECTED Final   Serratia marcescens NOT DETECTED NOT DETECTED Final   Haemophilus influenzae NOT DETECTED NOT DETECTED Final   Neisseria meningitidis NOT DETECTED NOT DETECTED Final   Pseudomonas aeruginosa NOT DETECTED NOT DETECTED Final   Stenotrophomonas maltophilia NOT DETECTED NOT DETECTED Final   Candida albicans NOT DETECTED NOT DETECTED Final   Candida auris NOT DETECTED NOT DETECTED Final   Candida glabrata NOT DETECTED NOT DETECTED Final   Candida krusei NOT DETECTED NOT DETECTED Final   Candida parapsilosis NOT DETECTED NOT DETECTED Final   Candida tropicalis NOT DETECTED NOT DETECTED Final   Cryptococcus neoformans/gattii NOT DETECTED NOT DETECTED Final   Meth resistant mecA/C and MREJ NOT DETECTED NOT DETECTED Final     Comment: Performed at [facility], [address] [place], [place] [address]  Culture, blood (Routine X 2) w Reflex to ID Panel     Status: None   Collection Time: 08/19/22  6:16 AM   Specimen: BLOOD  Result Value Ref Range Status   Specimen Description BLOOD BLOOD LEFT ARM  Final   Special Requests   Final    BOTTLES DRAWN AEROBIC AND ANAEROBIC Blood Culture adequate volume   Culture   Final    NO GROWTH 5 DAYS Performed at [facility], [address] [place], [place] [address]    Report Status 08/24/2022 FINAL  Final  Culture, blood (Routine X 2) w Reflex to ID Panel     Status: None   Collection Time: 08/19/22  6:18 AM   Specimen: BLOOD  Result Value Ref Range Status   Specimen Description BLOOD BLOOD RIGHT HAND  Final   Special Requests   Final    BOTTLES DRAWN AEROBIC AND ANAEROBIC Blood Culture adequate volume   Culture   Final    NO GROWTH 5 DAYS Performed at [facility], [address], [place], [place] [address]    Report Status 08/24/2022 FINAL  Final  Surgical PCR screen     Status: Abnormal   Collection Time: 08/20/22 10:04 AM   Specimen: Nasal Mucosa; Nasal Swab  Result Value Ref Range Status   MRSA, PCR NEGATIVE NEGATIVE Final   Staphylococcus aureus POSITIVE (A) NEGATIVE Final    Comment: (NOTE) The Xpert SA Assay (FDA approved for NASAL specimens in patients 22 years  of age and older), is one component of a comprehensive surveillance program. It is not intended to diagnose infection nor to guide or monitor treatment. Performed at [facility], [address] [place], [place] [address]     Labs: CBC: Recent Labs  Lab 08/22/22 0731 08/24/22 0651  WBC 12.7* 14.6*  HGB 15.1 13.9  HCT 44.8 40.7  MCV 94.3 94.9  PLT 134* 024   Basic Metabolic Panel: Recent Labs  Lab 08/22/22 0731 08/23/22 0546 08/24/22 0651 08/27/22 0500  NA 136 138 135 135  K 4.9 5.2* 4.2 4.0  CL 104 104 104 104  CO2 '26 31 26 25   '$ GLUCOSE 105* 102* 93 122*  BUN 30* 28* 22 17  CREATININE 1.00 1.02 1.00 0.92  CALCIUM 7.9* 7.5* 7.5* 8.1*  MG  --   --  2.2  --    Liver Function Tests: Recent Labs  Lab 08/22/22 0731 08/23/22 0546 08/27/22 0500  AST 42* 36 20  ALT 69* 56* 21  ALKPHOS 50 49 66  BILITOT 1.1 0.9 2.0*  PROT 5.7* 5.2* 6.2*  ALBUMIN 2.6* 2.3* 2.5*   CBG: No results for input(s): "GLUCAP" in the last 168 hours.  Discharge time spent: greater than 30 minutes.  Signed: Jennye Boroughs, MD [facility] 08/28/2022

## 2022-08-28 NOTE — TOC Progression Note (Signed)
Transition of Care [facility]) - Progression Note    Patient Details  Name: Nicholas [name] MRN: [id number] Date of Birth: 1951/07/12  Transition [facility]) CM/SW Contact  Shelbie Hutching, RN Phone Number: 08/28/2022, 9:14 AM  Clinical Narrative:    Insurance authorization approved [id number], FXTK WIOX ID [id number], approved from 11/14- 11/16.   Expected Discharge Plan: [place] Barriers to Discharge: Continued Medical Work up  Expected Discharge Plan and Services Expected Discharge Plan: [place]   Discharge Planning Services: CM Consult Post Acute Care Choice: [place] Living arrangements for the past 2 months: Single Family Home                 DME Arranged: N/A DME Agency: NA                   Social Determinants of Health (SDOH) Interventions    Readmission Risk Interventions     No data to display

## 2022-08-28 NOTE — TOC Transition Note (Signed)
Transition of Care [facility]) - CM/SW Discharge Note   Patient Details  Name: Nicholas [name] MRN: [id number] Date of Birth: [birth year]  Transition of Care [facility]) CM/SW Contact:  Candie Chroman, LCSW Phone Number: 08/28/2022, 11:38 AM   Clinical Narrative:  Patient has orders to discharge to [facility] today. RN will call report to [phone] (Room 121B). EMS transport has been arranged and he is 5th on the list. No further concerns. CSW signing off.  Final next level of care: Skilled Nursing Facility Barriers to Discharge: Barriers Resolved   Patient Goals and CMS Choice Patient states their goals for this hospitalization and ongoing recovery are:: patient would like to go to [facility] CMS Medicare.gov Compare Post Acute Care list provided to:: Patient Choice offered to / list presented to : Patient, Adult Children  Discharge Placement   Existing PASRR number confirmed : 08/27/22          Patient chooses bed at: [facility] Patient to be transferred to facility by: EMS Name of family member notified: Marcha Dutton Patient and family notified of of transfer: 08/28/22  Discharge Plan and Services   Discharge Planning Services: CM Consult Post Acute Care Choice: [place]          DME Arranged: N/A DME Agency: NA                  Social Determinants of Health ([place]) Interventions     Readmission Risk Interventions     No data to display

## 2022-08-28 NOTE — Evaluation (Signed)
Physical Therapy Evaluation Patient Details Name: Nicholas [name] MRN: [id number] DOB: 09/11/1951 Today's Date: 08/28/2022  History of Present Illness  Pt is a 71 y.o. male presenting back to hospital 11/12 (unable to self administer IV medications).  Recent hospitalization 11/2 to 11/11 for MSSA bacteremia after presenting with weakness and falls.  Pt admitted with MSSA bacteremia,   PMH includes diastolic CHF, seizure disorder, a-fib on Eliquis s/p cardioversion 08/24/22, recent COVID-19 08/12/22, AKI, PFO, stroke, L great toe amputation, B reverse shoulder arthroplasty, B TKA.  Clinical Impression  Prior to recent hospital admissions, pt was ambulatory (used RW more than cane); lives in 1st floor apt (level entry).  Currently pt is mod to max assist with bed mobility; good sitting balance; and unable to stand up to walker with 1 assist and bed height elevated (limited d/t back pain and weakness).  Pt would benefit from skilled PT to address noted impairments and functional limitations (see below for any additional details).  Upon hospital discharge, pt would benefit from SNF.    Recommendations for follow up therapy are one component of a multi-disciplinary discharge planning process, led by the attending physician.  Recommendations may be updated based on patient status, additional functional criteria and insurance authorization.  Follow Up Recommendations Skilled nursing-short term rehab (<3 hours/day) Can patient physically be transported by private vehicle: No    Assistance Recommended at Discharge Frequent or constant Supervision/Assistance  Patient can return home with the following  Two people to help with walking and/or transfers;Two people to help with bathing/dressing/bathroom;Assistance with cooking/housework;Assist for transportation;Help with stairs or ramp for entrance    Equipment Recommendations Rolling walker (2 wheels);BSC/3in1;Wheelchair (measurements PT);Wheelchair cushion  (measurements PT)  Recommendations for Other Services       Functional Status Assessment Patient has had a recent decline in their functional status and demonstrates the ability to make significant improvements in function in a reasonable and predictable amount of time.     Precautions / Restrictions Precautions Precautions: Fall Restrictions Weight Bearing Restrictions: No      Mobility  Bed Mobility Overal bed mobility: Needs Assistance Bed Mobility: Supine to Sit, Sit to Supine     Supine to sit: Mod assist, Max assist, HOB elevated Sit to supine: Mod assist, Max assist, HOB elevated   General bed mobility comments: assist for trunk and B LE's; vc's for technique; limited d/t low back pain    Transfers Overall transfer level: Needs assistance Equipment used: Rolling walker (2 wheels) Transfers: Sit to/from Stand Sit to Stand: Total assist           General transfer comment: unable to stand from elevated bed x3 trials with max cueing and assist    Ambulation/Gait               General Gait Details: unable to stand to attempt  Stairs            Wheelchair Mobility    Modified Rankin (Stroke Patients Only)       Balance Overall balance assessment: Needs assistance Sitting-balance support: No upper extremity supported, Feet supported Sitting balance-Leahy Scale: Good Sitting balance - Comments: steady sitting reaching within BOS                                     Pertinent Vitals/Pain Pain Assessment Pain Assessment: 0-10 Pain Score: 6  Pain Location: low back-chronic Pain Descriptors / Indicators: Constant,  Aching, Guarding, Grimacing Pain Intervention(s): Limited activity within patient's tolerance, Monitored during session, Premedicated before session, Repositioned, Other (comment) (RN notified) Vitals (HR and O2 on room air) stable and WFL throughout treatment session.    Home Living Family/patient expects to be  discharged to:: Skilled nursing facility Living Arrangements: Alone Available Help at Discharge: Available PRN/intermittently;Family Type of Home: Independent living facility Home Access: Ramped entrance       Home Layout: One level Home Equipment: Rollator (4 wheels);Cane - single point;Transport planner 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    Prior Function Prior Level of Function : Independent/Modified Independent;Driving             Mobility Comments: Pt ambulatory with rollator but reporting many recent falls. ADLs Comments: Per OT eval "reports needing a lot of help for basic ADL and all IADL within last 4-64month"     Hand Dominance        Extremity/Trunk Assessment   Upper Extremity Assessment Upper Extremity Assessment: Generalized weakness    Lower Extremity Assessment Lower Extremity Assessment: Generalized weakness RLE Deficits / Details: R knee maintains extension at all times as pt reports he has a "steel rod" in it & has been unable to flex knee at all x 2 years    Cervical / Trunk Assessment Cervical / Trunk Assessment: Other exceptions Cervical / Trunk Exceptions: forward head/shoulders  Communication   Communication: HOH  Cognition Arousal/Alertness: Awake/alert Behavior During Therapy: WFL for tasks assessed/performed Overall Cognitive Status: Within Functional Limits for tasks assessed                                 General Comments: A&O x4        General Comments  Nursing cleared pt for participation in physical therapy.  Pt agreeable to PT session.    Exercises  Transfer training.   Assessment/Plan    PT Assessment Patient needs continued PT services  PT Problem List Decreased strength;Decreased activity tolerance;Decreased balance;Decreased mobility;Decreased knowledge of use of DME;Decreased knowledge of precautions;Pain       PT Treatment Interventions DME instruction;Gait  training;Functional mobility training;Therapeutic activities;Therapeutic exercise;Balance training;Patient/family education    PT Goals (Current goals can be found in the Care Plan section)  Acute Rehab PT Goals Patient Stated Goal: to improve strength and mobility PT Goal Formulation: With patient Time For Goal Achievement: 09/11/22 Potential to Achieve Goals: Good    Frequency Min 2X/week     Co-evaluation               AM-PAC PT "6 Clicks" Mobility  Outcome Measure Help needed turning from your back to your side while in a flat bed without using bedrails?: A Lot Help needed moving from lying on your back to sitting on the side of a flat bed without using bedrails?: A Lot Help needed moving to and from a bed to a chair (including a wheelchair)?: Total Help needed standing up from a chair using your arms (e.g., wheelchair or bedside chair)?: Total Help needed to walk in hospital room?: Total Help needed climbing 3-5 steps with a railing? : Total 6 Click Score: 8    End of Session Equipment Utilized During Treatment: Gait belt Activity Tolerance: Patient limited by pain Patient left: in bed;with call bell/phone within reach;with bed alarm set;with family/visitor present Nurse Communication: Mobility status;Precautions;Other (comment) (pt's pain status) PT Visit Diagnosis: Muscle weakness (generalized) (M62.81);Difficulty in walking, not  elsewhere classified (R26.2);Pain;History of falling (Z91.81)    Time: 6979-4801 PT Time Calculation (min) (ACUTE ONLY): 23 min   Charges:   PT Evaluation $PT Eval Low Complexity: 1 Low PT Treatments $Therapeutic Activity: 8-22 mins       Leitha Bleak, PT 08/28/22, 3:35 PM

## 2022-08-29 DIAGNOSIS — R7881 Bacteremia: Secondary | ICD-10-CM | POA: Diagnosis not present

## 2022-08-29 DIAGNOSIS — I5032 Chronic diastolic (congestive) heart failure: Secondary | ICD-10-CM | POA: Diagnosis not present

## 2022-08-29 DIAGNOSIS — Z89412 Acquired absence of left great toe: Secondary | ICD-10-CM | POA: Diagnosis not present

## 2022-08-29 DIAGNOSIS — N1831 Chronic kidney disease, stage 3a: Secondary | ICD-10-CM | POA: Diagnosis not present

## 2022-08-29 DIAGNOSIS — I482 Chronic atrial fibrillation, unspecified: Secondary | ICD-10-CM | POA: Diagnosis not present

## 2022-08-31 DIAGNOSIS — I5032 Chronic diastolic (congestive) heart failure: Secondary | ICD-10-CM | POA: Diagnosis not present

## 2022-08-31 DIAGNOSIS — I482 Chronic atrial fibrillation, unspecified: Secondary | ICD-10-CM | POA: Diagnosis not present

## 2022-08-31 DIAGNOSIS — R7881 Bacteremia: Secondary | ICD-10-CM | POA: Diagnosis not present

## 2022-08-31 DIAGNOSIS — N1831 Chronic kidney disease, stage 3a: Secondary | ICD-10-CM | POA: Diagnosis not present

## 2022-08-31 DIAGNOSIS — Z89412 Acquired absence of left great toe: Secondary | ICD-10-CM | POA: Diagnosis not present

## 2022-09-03 DIAGNOSIS — R7881 Bacteremia: Secondary | ICD-10-CM | POA: Diagnosis not present

## 2022-09-03 DIAGNOSIS — R7303 Prediabetes: Secondary | ICD-10-CM | POA: Diagnosis not present

## 2022-09-03 DIAGNOSIS — Z89412 Acquired absence of left great toe: Secondary | ICD-10-CM | POA: Diagnosis not present

## 2022-09-03 NOTE — Telephone Encounter (Signed)
LMOV to schedule  

## 2022-09-04 ENCOUNTER — Other Ambulatory Visit: Payer: Self-pay | Admitting: *Deleted

## 2022-09-04 DIAGNOSIS — M25531 Pain in right wrist: Secondary | ICD-10-CM | POA: Diagnosis not present

## 2022-09-04 DIAGNOSIS — R0989 Other specified symptoms and signs involving the circulatory and respiratory systems: Secondary | ICD-10-CM | POA: Diagnosis not present

## 2022-09-04 DIAGNOSIS — R410 Disorientation, unspecified: Secondary | ICD-10-CM | POA: Diagnosis not present

## 2022-09-04 DIAGNOSIS — R5383 Other fatigue: Secondary | ICD-10-CM | POA: Diagnosis not present

## 2022-09-04 NOTE — Telephone Encounter (Signed)
-----   Message from [place], PA-C sent at 08/27/2022 10:09 AM EST ----- Regarding: RE: [place] follow-up [place], he will eventually need 3-4 week follow-up. He's in the ED for non-cardiac issues  ----- Message ----- From: Eli Phillips Sent: 08/27/2022   9:31 AM EST To: Cadence Ninfa Meeker, PA-C Subject: RE: [facility] follow-up                             Patient is currently in ED. ----- Message ----- From: Antony Madura, PA-C Sent: 08/25/2022   8:37 AM EST To: C[facility] Div [facility] Triage; C[facility] Scheduling Subject: Hosp follow-up                                 Pt needs hosp follow-up in 3-4 weeks

## 2022-09-04 NOTE — Patient Outreach (Signed)
[name] resides in [facility]. Screening for potential [facility] care coordination services as benefit of insurance plan and PCP.   Update received from [place], [place] social worker. [name] transition plan is to return home alone.   Will continue to follow while [name] resides in SNF.    Marthenia Rolling, MSN, RN,BSN [place] Acute Care Coordinator [phone] (Direct dial)

## 2022-09-04 NOTE — Telephone Encounter (Signed)
No VM set up to schedule f/u

## 2022-09-05 DIAGNOSIS — M25531 Pain in right wrist: Secondary | ICD-10-CM | POA: Diagnosis not present

## 2022-09-05 DIAGNOSIS — R262 Difficulty in walking, not elsewhere classified: Secondary | ICD-10-CM | POA: Diagnosis not present

## 2022-09-05 DIAGNOSIS — M25431 Effusion, right wrist: Secondary | ICD-10-CM | POA: Diagnosis not present

## 2022-09-05 DIAGNOSIS — R5381 Other malaise: Secondary | ICD-10-CM | POA: Diagnosis not present

## 2022-09-05 DIAGNOSIS — I5032 Chronic diastolic (congestive) heart failure: Secondary | ICD-10-CM | POA: Diagnosis not present

## 2022-09-05 DIAGNOSIS — M25561 Pain in right knee: Secondary | ICD-10-CM | POA: Diagnosis not present

## 2022-09-10 ENCOUNTER — Ambulatory Visit (INDEPENDENT_AMBULATORY_CARE_PROVIDER_SITE_OTHER): Payer: Medicare Other | Admitting: Internal Medicine

## 2022-09-10 ENCOUNTER — Other Ambulatory Visit: Payer: Self-pay

## 2022-09-10 ENCOUNTER — Telehealth: Payer: Self-pay

## 2022-09-10 ENCOUNTER — Encounter: Payer: Self-pay | Admitting: Internal Medicine

## 2022-09-10 VITALS — BP 111/74 | HR 97 | Temp 98.3°F | Ht 68.0 in

## 2022-09-10 DIAGNOSIS — R7881 Bacteremia: Secondary | ICD-10-CM

## 2022-09-10 DIAGNOSIS — R0981 Nasal congestion: Secondary | ICD-10-CM | POA: Diagnosis not present

## 2022-09-10 DIAGNOSIS — N390 Urinary tract infection, site not specified: Secondary | ICD-10-CM | POA: Diagnosis not present

## 2022-09-10 DIAGNOSIS — B9561 Methicillin susceptible Staphylococcus aureus infection as the cause of diseases classified elsewhere: Secondary | ICD-10-CM | POA: Diagnosis not present

## 2022-09-10 DIAGNOSIS — R110 Nausea: Secondary | ICD-10-CM | POA: Diagnosis not present

## 2022-09-10 NOTE — Patient Instructions (Signed)
MSSA bacteremia  Patient doing well currently on linezolid '600mg'$  PO BID planned through 09/16/22 for his MSSA bacteremia then will stop antibiotics.  Will continue as is and have him follow up in 2 weeks for repeat blood cultures to ensure no relapsed bacteremia.  Will defer to his SNF providers to determine when appropriate for him to be discharged home.

## 2022-09-10 NOTE — Assessment & Plan Note (Signed)
Patient doing well currently on linezolid '600mg'$  PO BID planned through 09/16/22 for his MSSA bacteremia then will stop antibiotics.  Will continue as is and have him follow up in 2 weeks for repeat blood cultures to ensure no relapsed bacteremia.  Will defer to his SNF providers to determine when appropriate for him to be discharged home.

## 2022-09-10 NOTE — Progress Notes (Signed)
[place] for Infectious Disease  CHIEF COMPLAINT:    Follow up for MSSA bacteremia  SUBJECTIVE:    Nicholas [name] is a 71 y.o. male with PMHx as below who presents to the clinic for MSSA bacteremia.   Patient was admitted 08/16/22 - 08/25/22 at [facility] with [place] and found to have MSSA bacteremia with positive blood cultures from 08/16/22.  He was started on Cefazolin and repeat blood cultures were clear as of 08/19/22.  TEE was negative for endocarditis and plan was to treat with Cefazolin 2gm q8h through 09/01/22 then transition to linezolid for an additional 2 weeks through 09/16/22.  He was discharged home but returned the next day because family was unable to care for him and administer the antibiotics as directed.  He was continued as planned on cefazolin and then discharged to a SNF on 08/28/22 with same antibiotic plan in place as before.  He presents today for follow up.  He reports doing okay on his antibiotics.  His PICC line was removed about 1 week ago.  He apparently is also on Keflex for a UTI per the [facility] sent from the SNF.  He has no fevers, chills.  He wants to be home soon.  He will have occasional pain in back and knees but nothing constant or worsening.  No other joint pains.  He does still have a cough since having Covid last month.   Please see A&P for the details of today's visit and status of the patient's medical problems.   Patient's Medications  New Prescriptions   No medications on file  Previous Medications   ACETAMINOPHEN (TYLENOL) 500 MG TABLET    Take 1,000 mg by mouth 3 (three) times daily.   AMITRIPTYLINE (ELAVIL) 50 MG TABLET    TAKE TWO TABLETS BY MOUTH EVERYDAY AT BEDTIME   APIXABAN (ELIQUIS) 5 MG TABS TABLET    Take 1 tablet (5 mg total) by mouth 2 (two) times daily.   ATORVASTATIN (LIPITOR) 20 MG TABLET    TAKE ONE TABLET BY MOUTH ONCE DAILY   B COMPLEX VITAMINS (B-COMPLEX/B-12 PO)    Take 1 tablet by mouth daily.   CETIRIZINE (ZYRTEC) 10 MG  TABLET    Take 1 tablet (10 mg total) by mouth daily.   CHOLECALCIFEROL (VITAMIN D3) 25 MCG (1000 UT) CAPS    Take 1 capsule (1,000 Units total) by mouth daily.   CLOTRIMAZOLE (LOTRIMIN) 1 % CREAM    Apply 1 Application topically 2 (two) times daily as needed (rash).   DIPHENHYDRAMINE (BENADRYL) 25 MG CAPSULE    Take 25 mg by mouth every 6 (six) hours as needed for allergies.   DIVALPROEX (DEPAKOTE ER) 500 MG 24 HR TABLET    TAKE ONE TABLET BY MOUTH EVERYDAY AT BEDTIME   EPINEPHRINE 0.3 MG/0.3 ML IJ SOAJ INJECTION    Inject 0.3 mg into the muscle as needed for anaphylaxis. (bee stings)   FERROUS SULFATE 325 (65 FE) MG EC TABLET    Take 325 mg by mouth daily with breakfast.   FLUTICASONE (FLONASE) 50 MCG/ACT NASAL SPRAY    Place 2 sprays into both nostrils daily.   FREMANEZUMAB-VFRM (AJOVY) 225 MG/1.5ML SOAJ    Inject 225 mg into the skin every 30 (thirty) days.   FUROSEMIDE (LASIX) 20 MG TABLET    TAKE ONE TABLET BY MOUTH EVERY MORNING   GABAPENTIN (NEURONTIN) 300 MG CAPSULE    TAKE ONE CAPSULE BY MOUTH EVERY MORNING  and TAKE TWO CAPSULES BY MOUTH AT NOON and TAKE TWO CAPSULES BY MOUTH EVERY EVENING   HYDROCODONE-ACETAMINOPHEN (NORCO/VICODIN) 5-325 MG TABLET    Take 1 tablet by mouth every 6 (six) hours as needed for moderate pain.   LEVOTHYROXINE (SYNTHROID) 112 MCG TABLET    Take 1 tablet (112 mcg total) by mouth daily before breakfast.   LINEZOLID (ZYVOX) 600 MG TABLET    Take 1 tablet (600 mg total) by mouth 2 (two) times daily for 14 days.   METFORMIN (GLUCOPHAGE) 500 MG TABLET    Take 500 mg by mouth daily.   METHOCARBAMOL (ROBAXIN) 500 MG TABLET    TAKE ONE TABLET BY MOUTH twice daily AS NEEDED FOR muscle SPASMS   METOPROLOL SUCCINATE (TOPROL-XL) 100 MG 24 HR TABLET    Take 1 tablet (100 mg total) by mouth at bedtime. Take with or immediately following a meal.   NITROGLYCERIN (NITROSTAT) 0.4 MG SL TABLET    Place 1 tablet (0.4 mg total) under the tongue every 5 (five) minutes as needed for  chest pain.   OMEPRAZOLE (PRILOSEC OTC) 20 MG TABLET    Take 20 mg by mouth daily.   ONDANSETRON (ZOFRAN-ODT) 4 MG DISINTEGRATING TABLET    TAKE ONE TABLET BY MOUTH EVERY 8 HOURS AS NEEDED   PANTOPRAZOLE (PROTONIX) 40 MG TABLET    Take 1 tablet (40 mg total) by mouth every other day.   TAMSULOSIN (FLOMAX) 0.4 MG CAPS CAPSULE    Take 1 capsule (0.4 mg total) by mouth daily.   TIZANIDINE (ZANAFLEX) 4 MG TABLET    TAKE ONE TABLET BY MOUTH every SIX hours AS NEEDED FOR acute HEADACHE   UBROGEPANT (UBRELVY) 100 MG TABS    Take 100 mg by mouth as needed (take 1 tablet at onset of headache, may repeat in hours, max is 200 mg in 24 hours).  Modified Medications   No medications on file  Discontinued Medications   No medications on file      Past Medical History:  Diagnosis Date   Acute kidney failure 08/2008   "cleared up"  no problems since   Allergy    Anxiety    Arthritis    Asthma    ?of this no inhaler   Blood transfusion    CHF (congestive heart failure) ([place])    ?of this, pt denies   CLOSTRIDIUM DIFFICILE COLITIS 07/04/2010   Annotation: 12/09, 2/10 Qualifier: Diagnosis of  By: [name], [name]     Depression with anxiety    Duodenitis determined by biopsy 02/2016   peptic likely due to aleve (erosive gastropathy with duodenal erosions)   ECZEMA 07/04/2010   Qualifier: Diagnosis of  By: [name], [name]     Elevated liver enzymes    Fatty liver    Full dentures    GERD (gastroesophageal reflux disease)    Heart attack ([place])    08/2008 (likely demand ischemia in the setting of MSSA sepsis/R TKA  infection)10-2008   History of hiatal hernia    History of stomach ulcers    Hypothyroidism    Lower GI bleeding    MALAR AND MAXILLARY BONES CLOSED FRACTURE 07/04/2010   Annotation: ORIF Qualifier: Diagnosis of  By: [name], [name]     Migraine    "definitely"   MRSA (methicillin resistant Staphylococcus aureus)    in leg, had to place steel rod in leg   Personal history of  colonic adenoma 06/01/2003   PFO (patent foramen ovale)  small PFO by 08/2008 TEE   Pneumonia 08/2008   "while in ICU"   Seizures ([place]) 2009   "long time ago"     Sleep apnea    does not wear CPAP   Stroke ([place]) 2010   unable to complete sentences at times   Wears glasses     Social History   Tobacco Use   Smoking status: Never    Passive exposure: Past   Smokeless tobacco: Never  Vaping Use   Vaping Use: Never used  Substance Use Topics   Alcohol use: No    Alcohol/week: 0.0 standard drinks of alcohol    Comment: "I abused alcohol; last drink  ~ 2005"   Drug use: No    Family History  Problem Relation Age of Onset   Coronary artery disease Father 46   Stroke Father    CAD Mother 76       stents   Breast cancer Sister    Diabetes Brother    Diabetes Sister    Colon cancer Neg Hx    Stomach cancer Neg Hx     Allergies  Allergen Reactions   Peanut-Containing Drug Products Anaphylaxis and Dermatitis   Yellow Jacket Venom [Bee Venom] Anaphylaxis and Other (See Comments)    Respiratory Distress   Celebrex [Celecoxib] Other (See Comments)    BLACK STOOL?MELENA?   Percocet [Oxycodone-Acetaminophen] Hives, Swelling and Other (See Comments)   Latex Rash   Nsaids Rash    Review of Systems  Constitutional: Negative.   Respiratory: Negative.    Cardiovascular: Negative.   Gastrointestinal: Negative.      OBJECTIVE:    Vitals:   09/10/22 1500  BP: 111/74  Pulse: 97  Temp: 98.3 F (36.8 C)  TempSrc: Oral  SpO2: 92%  Height: '5\' 8"'$  (1.727 m)   Body mass index is 37.95 kg/m.  Physical Exam Constitutional:      General: He is not in acute distress.    Appearance: Normal appearance.  HENT:     Head: Normocephalic and atraumatic.  Eyes:     Extraocular Movements: Extraocular movements intact.     Conjunctiva/sclera: Conjunctivae normal.  Pulmonary:     Effort: Pulmonary effort is normal. No respiratory distress.  Abdominal:     General: There is  no distension.     Palpations: Abdomen is soft.  Musculoskeletal:        General: Normal range of motion.     Cervical back: Normal range of motion and neck supple.     Comments: Post-surgical changes to right knee.  No warmth/erythema. Right UE PICC line removed.   Skin:    General: Skin is warm and dry.  Neurological:     General: No focal deficit present.     Mental Status: He is alert and oriented to person, place, and time.  Psychiatric:        Mood and Affect: Mood normal.        Behavior: Behavior normal.      Labs and Microbiology:    Latest Ref Rng & Units 08/24/2022    6:51 AM 08/22/2022    7:31 AM 08/21/2022    8:04 AM  CBC  WBC 4.0 - 10.5 K/uL 14.6  12.7  14.7   Hemoglobin 13.0 - 17.0 g/dL 13.9  15.1  14.0   Hematocrit 39.0 - 52.0 % 40.7  44.8  41.1   Platelets 150 - 400 K/uL 176  134  207  Latest Ref Rng & Units 08/27/2022    5:00 AM 08/24/2022    6:51 AM 08/23/2022    5:46 AM  CMP  Glucose 70 - 99 mg/dL 122  93  102   BUN 8 - 23 mg/dL '17  22  28   '$ Creatinine 0.61 - 1.24 mg/dL 0.92  1.00  1.02   Sodium 135 - 145 mmol/L 135  135  138   Potassium 3.5 - 5.1 mmol/L 4.0  4.2  5.2   Chloride 98 - 111 mmol/L 104  104  104   CO2 22 - 32 mmol/L '25  26  31   '$ Calcium 8.9 - 10.3 mg/dL 8.1  7.5  7.5   Total Protein 6.5 - 8.1 g/dL 6.2   5.2   Total Bilirubin 0.3 - 1.2 mg/dL 2.0   0.9   Alkaline Phos 38 - 126 U/L 66   49   AST 15 - 41 U/L 20   36   ALT 0 - 44 U/L 21   56       ASSESSMENT & PLAN:    MSSA bacteremia 08/16/2022 Patient doing well currently on linezolid '600mg'$  PO BID planned through 09/16/22 for his MSSA bacteremia then will stop antibiotics.  Will continue as is and have him follow up in 2 weeks for repeat blood cultures to ensure no relapsed bacteremia.  Will defer to his SNF providers to determine when appropriate for him to be discharged home.       Raynelle High[facility] for Infectious Disease [place]  [facility] 09/10/2022, 3:25 PM  I have personally spent 40 minutes involved in face-to-face and non-face-to-face activities for this patient on the day of the visit. Professional time spent includes the following activities: Preparing to see the patient (review of tests), Obtaining and/or reviewing separately obtained history (admission/discharge record), Performing a medically appropriate examination and/or evaluation , Ordering medications/tests/procedures, referring and communicating with other health care professionals, Documenting clinical information in the EMR, Independently interpreting results (not separately reported), Communicating results to the patient/family/caregiver, Counseling and educating the patient/family/caregiver and Care coordination (not separately reported).

## 2022-09-10 NOTE — Telephone Encounter (Signed)
Patient seen in office today.  [facility] [phone]) to review report of consultation/AVS. Spoke with Caryl Ada, RN. Copy of AVS, provider note, and report of consultation also sent back with patient.  Per instructions on AVS from Dr. Juleen China, relayed order that patient should continue linezolid 600 mg PO BID through 09/16/2022, then stop antibiotics.  Communicated that patient is scheduled for follow up appointment with Dr. Juleen China and blood cultures on 10/03/22 at 4pm.  RN verbalized understanding of orders and date/time/location of appointment. Stated she would update facility scheduler. All questions answered.  Binnie Kand, RN

## 2022-09-11 DIAGNOSIS — R29898 Other symptoms and signs involving the musculoskeletal system: Secondary | ICD-10-CM | POA: Diagnosis not present

## 2022-09-11 DIAGNOSIS — M25561 Pain in right knee: Secondary | ICD-10-CM | POA: Diagnosis not present

## 2022-09-12 ENCOUNTER — Other Ambulatory Visit: Payer: Self-pay | Admitting: *Deleted

## 2022-09-12 DIAGNOSIS — R051 Acute cough: Secondary | ICD-10-CM | POA: Diagnosis not present

## 2022-09-12 DIAGNOSIS — R1031 Right lower quadrant pain: Secondary | ICD-10-CM | POA: Diagnosis not present

## 2022-09-12 NOTE — Patient Outreach (Signed)
[place] Coordinator follow up. Mr. Eisner resides in [facility]. Screening for potential [facility] care coordination services as benefit of insurance plan and PCP.   Update received from [place], [place] social worker. Projected discharge date is 09/16/22 from insurance. Will return home. Lives alone. Walking short distances. Not independent.   Will plan outreach to discuss [facility] follow up.  Marthenia Rolling, MSN, RN,BSN [place] Acute Care Coordinator [phone] (Direct dial)

## 2022-09-13 DIAGNOSIS — R509 Fever, unspecified: Secondary | ICD-10-CM | POA: Diagnosis not present

## 2022-09-13 DIAGNOSIS — N390 Urinary tract infection, site not specified: Secondary | ICD-10-CM | POA: Diagnosis not present

## 2022-09-13 DIAGNOSIS — R1031 Right lower quadrant pain: Secondary | ICD-10-CM | POA: Diagnosis not present

## 2022-09-14 DIAGNOSIS — S39013A Strain of muscle, fascia and tendon of pelvis, initial encounter: Secondary | ICD-10-CM | POA: Diagnosis not present

## 2022-09-14 DIAGNOSIS — F32A Depression, unspecified: Secondary | ICD-10-CM | POA: Diagnosis not present

## 2022-09-14 DIAGNOSIS — R1031 Right lower quadrant pain: Secondary | ICD-10-CM | POA: Diagnosis not present

## 2022-09-17 DIAGNOSIS — R29898 Other symptoms and signs involving the musculoskeletal system: Secondary | ICD-10-CM | POA: Diagnosis not present

## 2022-09-17 DIAGNOSIS — S39013D Strain of muscle, fascia and tendon of pelvis, subsequent encounter: Secondary | ICD-10-CM | POA: Diagnosis not present

## 2022-09-17 DIAGNOSIS — N390 Urinary tract infection, site not specified: Secondary | ICD-10-CM | POA: Diagnosis not present

## 2022-09-18 DIAGNOSIS — N390 Urinary tract infection, site not specified: Secondary | ICD-10-CM | POA: Diagnosis not present

## 2022-09-19 ENCOUNTER — Other Ambulatory Visit: Payer: Self-pay | Admitting: *Deleted

## 2022-09-19 DIAGNOSIS — S39013D Strain of muscle, fascia and tendon of pelvis, subsequent encounter: Secondary | ICD-10-CM | POA: Diagnosis not present

## 2022-09-19 DIAGNOSIS — N390 Urinary tract infection, site not specified: Secondary | ICD-10-CM | POA: Diagnosis not present

## 2022-09-19 NOTE — Patient Outreach (Signed)
[facility]- Acute Care Coordinator follow up. Mr. Bascomb resides in [facility]. Screening for potential [facility] care coordination services as benefit of insurance plan and PCP.  . Telephone call made to Mr. Kenyon [phone] to discuss [facility] care coordination services. No answer. HIPAA compliant voicemail message left to request return call.   Will continue to follow.    Marthenia Rolling, MSN, RN,BSN [place] Acute Care Coordinator [phone] (Direct dial)

## 2022-09-20 DIAGNOSIS — M62838 Other muscle spasm: Secondary | ICD-10-CM | POA: Diagnosis not present

## 2022-09-20 DIAGNOSIS — Z79899 Other long term (current) drug therapy: Secondary | ICD-10-CM | POA: Diagnosis not present

## 2022-09-20 DIAGNOSIS — J302 Other seasonal allergic rhinitis: Secondary | ICD-10-CM | POA: Diagnosis not present

## 2022-09-21 DIAGNOSIS — N1831 Chronic kidney disease, stage 3a: Secondary | ICD-10-CM | POA: Diagnosis not present

## 2022-09-21 DIAGNOSIS — R262 Difficulty in walking, not elsewhere classified: Secondary | ICD-10-CM | POA: Diagnosis not present

## 2022-09-21 DIAGNOSIS — Z89412 Acquired absence of left great toe: Secondary | ICD-10-CM | POA: Diagnosis not present

## 2022-09-21 DIAGNOSIS — M19011 Primary osteoarthritis, right shoulder: Secondary | ICD-10-CM | POA: Diagnosis not present

## 2022-09-21 DIAGNOSIS — R29898 Other symptoms and signs involving the musculoskeletal system: Secondary | ICD-10-CM | POA: Diagnosis not present

## 2022-09-21 DIAGNOSIS — I482 Chronic atrial fibrillation, unspecified: Secondary | ICD-10-CM | POA: Diagnosis not present

## 2022-09-21 DIAGNOSIS — L97523 Non-pressure chronic ulcer of other part of left foot with necrosis of muscle: Secondary | ICD-10-CM | POA: Diagnosis not present

## 2022-09-21 DIAGNOSIS — M6281 Muscle weakness (generalized): Secondary | ICD-10-CM | POA: Diagnosis not present

## 2022-09-21 DIAGNOSIS — I5023 Acute on chronic systolic (congestive) heart failure: Secondary | ICD-10-CM | POA: Diagnosis not present

## 2022-09-21 DIAGNOSIS — I5032 Chronic diastolic (congestive) heart failure: Secondary | ICD-10-CM | POA: Diagnosis not present

## 2022-09-24 ENCOUNTER — Other Ambulatory Visit: Payer: Self-pay | Admitting: *Deleted

## 2022-09-24 DIAGNOSIS — I5032 Chronic diastolic (congestive) heart failure: Secondary | ICD-10-CM

## 2022-09-24 NOTE — Patient Outreach (Signed)
[place] Coordinator follow up. Verified in [facility] Nicholas [name] discharged from [facility] SNF on 09/22/22.  Update received from SNF social worker indicating [facility] home health was arranged. Lives alone.   Will make referral to [facility] care coordination team.   Nicholas [name] has medical history of CHF, MSSA bactermia, seizure, afib, recent COVID.  Marthenia Rolling, MSN, RN,BSN [place] Acute Care Coordinator [phone] (Direct dial)

## 2022-09-25 ENCOUNTER — Telehealth: Payer: Self-pay | Admitting: *Deleted

## 2022-09-25 ENCOUNTER — Telehealth: Payer: Self-pay | Admitting: Family Medicine

## 2022-09-25 DIAGNOSIS — M1990 Unspecified osteoarthritis, unspecified site: Secondary | ICD-10-CM | POA: Diagnosis not present

## 2022-09-25 DIAGNOSIS — Z89422 Acquired absence of other left toe(s): Secondary | ICD-10-CM | POA: Diagnosis not present

## 2022-09-25 DIAGNOSIS — N390 Urinary tract infection, site not specified: Secondary | ICD-10-CM | POA: Diagnosis not present

## 2022-09-25 DIAGNOSIS — I4819 Other persistent atrial fibrillation: Secondary | ICD-10-CM | POA: Diagnosis not present

## 2022-09-25 DIAGNOSIS — Z89412 Acquired absence of left great toe: Secondary | ICD-10-CM | POA: Diagnosis not present

## 2022-09-25 DIAGNOSIS — J45909 Unspecified asthma, uncomplicated: Secondary | ICD-10-CM | POA: Diagnosis not present

## 2022-09-25 DIAGNOSIS — I5032 Chronic diastolic (congestive) heart failure: Secondary | ICD-10-CM | POA: Diagnosis not present

## 2022-09-25 DIAGNOSIS — R7881 Bacteremia: Secondary | ICD-10-CM | POA: Diagnosis not present

## 2022-09-25 DIAGNOSIS — K760 Fatty (change of) liver, not elsewhere classified: Secondary | ICD-10-CM | POA: Diagnosis not present

## 2022-09-25 DIAGNOSIS — E039 Hypothyroidism, unspecified: Secondary | ICD-10-CM | POA: Diagnosis not present

## 2022-09-25 DIAGNOSIS — K219 Gastro-esophageal reflux disease without esophagitis: Secondary | ICD-10-CM | POA: Diagnosis not present

## 2022-09-25 DIAGNOSIS — R531 Weakness: Secondary | ICD-10-CM | POA: Diagnosis not present

## 2022-09-25 DIAGNOSIS — G8929 Other chronic pain: Secondary | ICD-10-CM | POA: Diagnosis not present

## 2022-09-25 DIAGNOSIS — M549 Dorsalgia, unspecified: Secondary | ICD-10-CM | POA: Diagnosis not present

## 2022-09-25 DIAGNOSIS — B9561 Methicillin susceptible Staphylococcus aureus infection as the cause of diseases classified elsewhere: Secondary | ICD-10-CM | POA: Diagnosis not present

## 2022-09-25 DIAGNOSIS — Z8616 Personal history of COVID-19: Secondary | ICD-10-CM | POA: Diagnosis not present

## 2022-09-25 DIAGNOSIS — R296 Repeated falls: Secondary | ICD-10-CM | POA: Diagnosis not present

## 2022-09-25 DIAGNOSIS — N1831 Chronic kidney disease, stage 3a: Secondary | ICD-10-CM | POA: Diagnosis not present

## 2022-09-25 NOTE — Telephone Encounter (Signed)
Michel Harrow NP from [employer] called and stated she formed she for a cog test and he wasn't able to recall no three items and she prefers more cognitive tests and patient needs transportation for his visit tomorrow. Call back number [phone]

## 2022-09-25 NOTE — Telephone Encounter (Signed)
Home Health verbal orders Caller Name: Jeanine Luz Agency Name: [place] number: [phone]  Requesting Skilled nursing  Frequency: 1x a week for 4 wks  Requesting evaluation for OT, PT, and Social work  Please forward to [employer] or providers CMA

## 2022-09-25 NOTE — Progress Notes (Signed)
  Care Coordination  Outreach Note  09/25/2022 Name: Nicholas [name] MRN: [id number] DOB: Mar 10, 1951   Care Coordination Outreach Attempts: An unsuccessful telephone outreach was attempted today to offer the patient information about available care coordination services as a benefit of their health plan.   Referral received   Follow Up Plan:  Additional outreach attempts will be made to offer the patient care coordination information and services.   Encounter Outcome:  No Answer  Julian Hy, [place] Direct Dial: [phone]

## 2022-09-25 NOTE — Telephone Encounter (Signed)
Agree with this. Thanks.  

## 2022-09-26 ENCOUNTER — Encounter: Payer: Self-pay | Admitting: Family Medicine

## 2022-09-26 ENCOUNTER — Ambulatory Visit (INDEPENDENT_AMBULATORY_CARE_PROVIDER_SITE_OTHER): Payer: Medicare Other | Admitting: Family Medicine

## 2022-09-26 VITALS — BP 126/62 | HR 81 | Temp 97.9°F | Ht 68.0 in

## 2022-09-26 DIAGNOSIS — B370 Candidal stomatitis: Secondary | ICD-10-CM

## 2022-09-26 DIAGNOSIS — L97522 Non-pressure chronic ulcer of other part of left foot with fat layer exposed: Secondary | ICD-10-CM | POA: Diagnosis not present

## 2022-09-26 DIAGNOSIS — R7881 Bacteremia: Secondary | ICD-10-CM

## 2022-09-26 DIAGNOSIS — R338 Other retention of urine: Secondary | ICD-10-CM | POA: Diagnosis not present

## 2022-09-26 DIAGNOSIS — G894 Chronic pain syndrome: Secondary | ICD-10-CM | POA: Diagnosis not present

## 2022-09-26 DIAGNOSIS — R233 Spontaneous ecchymoses: Secondary | ICD-10-CM

## 2022-09-26 DIAGNOSIS — I4891 Unspecified atrial fibrillation: Secondary | ICD-10-CM

## 2022-09-26 DIAGNOSIS — G8929 Other chronic pain: Secondary | ICD-10-CM

## 2022-09-26 DIAGNOSIS — Z8673 Personal history of transient ischemic attack (TIA), and cerebral infarction without residual deficits: Secondary | ICD-10-CM

## 2022-09-26 DIAGNOSIS — Z8616 Personal history of COVID-19: Secondary | ICD-10-CM | POA: Diagnosis not present

## 2022-09-26 DIAGNOSIS — I5032 Chronic diastolic (congestive) heart failure: Secondary | ICD-10-CM

## 2022-09-26 DIAGNOSIS — B9561 Methicillin susceptible Staphylococcus aureus infection as the cause of diseases classified elsewhere: Secondary | ICD-10-CM | POA: Diagnosis not present

## 2022-09-26 DIAGNOSIS — R413 Other amnesia: Secondary | ICD-10-CM

## 2022-09-26 DIAGNOSIS — N3000 Acute cystitis without hematuria: Secondary | ICD-10-CM

## 2022-09-26 DIAGNOSIS — M79604 Pain in right leg: Secondary | ICD-10-CM

## 2022-09-26 DIAGNOSIS — M21962 Unspecified acquired deformity of left lower leg: Secondary | ICD-10-CM

## 2022-09-26 LAB — CBC WITH DIFFERENTIAL/PLATELET
Basophils Absolute: 0 10*3/uL (ref 0.0–0.1)
Basophils Relative: 0.3 % (ref 0.0–3.0)
Eosinophils Absolute: 0.3 10*3/uL (ref 0.0–0.7)
Eosinophils Relative: 1.9 % (ref 0.0–5.0)
HCT: 40.6 % (ref 39.0–52.0)
Hemoglobin: 13.4 g/dL (ref 13.0–17.0)
Lymphocytes Relative: 6.8 % — ABNORMAL LOW (ref 12.0–46.0)
Lymphs Abs: 1 10*3/uL (ref 0.7–4.0)
MCHC: 33 g/dL (ref 30.0–36.0)
MCV: 96.7 fl (ref 78.0–100.0)
Monocytes Absolute: 1.7 10*3/uL — ABNORMAL HIGH (ref 0.1–1.0)
Monocytes Relative: 11.2 % (ref 3.0–12.0)
Neutro Abs: 11.8 10*3/uL — ABNORMAL HIGH (ref 1.4–7.7)
Neutrophils Relative %: 79.8 % — ABNORMAL HIGH (ref 43.0–77.0)
Platelets: 268 10*3/uL (ref 150.0–400.0)
RBC: 4.2 Mil/uL — ABNORMAL LOW (ref 4.22–5.81)
RDW: 15 % (ref 11.5–15.5)
WBC: 14.8 10*3/uL — ABNORMAL HIGH (ref 4.0–10.5)

## 2022-09-26 LAB — COMPREHENSIVE METABOLIC PANEL
ALT: 32 U/L (ref 0–53)
AST: 21 U/L (ref 0–37)
Albumin: 3.2 g/dL — ABNORMAL LOW (ref 3.5–5.2)
Alkaline Phosphatase: 83 U/L (ref 39–117)
BUN: 28 mg/dL — ABNORMAL HIGH (ref 6–23)
CO2: 30 mEq/L (ref 19–32)
Calcium: 8.4 mg/dL (ref 8.4–10.5)
Chloride: 101 mEq/L (ref 96–112)
Creatinine, Ser: 1.21 mg/dL (ref 0.40–1.50)
GFR: 60.1 mL/min (ref >60.00)
Glucose, Bld: 136 mg/dL — ABNORMAL HIGH (ref 70–99)
Potassium: 4 mEq/L (ref 3.5–5.1)
Sodium: 140 mEq/L (ref 135–145)
Total Bilirubin: 0.8 mg/dL (ref 0.2–1.2)
Total Protein: 6.3 g/dL (ref 6.0–8.3)

## 2022-09-26 LAB — VITAMIN B12: Vitamin B-12: 810 pg/mL (ref 211–911)

## 2022-09-26 MED ORDER — NYSTATIN 100000 UNIT/ML MT SUSP: 5.0000 mL | Freq: Four times a day (QID) | OROMUCOSAL | 0 refills | Status: DC

## 2022-09-26 NOTE — Progress Notes (Unsigned)
Patient ID: Nicholas [name], male    DOB: Jun 10, 1951, 71 y.o.   MRN: [id number]  This visit was conducted in person.  BP 126/62   Pulse 81   Temp 97.9 F (36.6 C) (Temporal)   Ht '5\' 8"'$  (1.727 m)   SpO2 94%   BMI 37.95 kg/m    CC: hosp f/u visit  Subjective:   HPI: Nicholas [name] is a 71 y.o. male presenting on 09/26/2022 for Hospitalization Follow-up (Admitted on 08/26/22 at [facility], dx bacteremia. Pt accompanied by son, [name]. )   Patient arrived 16 min late to appointment.  Recent hospitalization 08/16/2022 for COVID infection with fall as well as MSSA bacteremia treated with cefazolin. TEE negative for endocarditis. Initially discharged home, rehospitalized as family couldn't manage IV abx. Discharged to [facility] 08/28/2022 where he completed IV abx, PICC line removed ~11/20, transitioned to oral linezolid '600mg'$  BID through 09/16/2022. Planning to repeat blood cultures with ID next week.  Hospital records reviewed. Med rec performed.    He saw Dr Juleen China ID 09/10/2022, note reviewed.   Came home from SNF on Saturday 09/22/2022, now staying at his apartment. Son lives 30 min away [name]).  He states he had 2 bouts of UTI at nursing home, currently taking cipro '500mg'$  7-10d course to complete on Friday. Overall improving symptoms, no urgency.  New rash to left foot popped up 3-4 days ago - not itchy or tender. No other rash to skin. Finished linezolid several weeks ago.   Since home feeling well - no fevers/chills, chest pain, abd pain, nausea, diarrhea, vomiting.  Notices some difficulty with memory.  Ongoing cough, notes throat discomfort.   [facility] home health was set up.  Other follow up appointments scheduled: infectious disease Dr Juleen China 12/20, ortho Dr [name] 12/21, neurology [place] 11/20/2022.  ______________________________________________________________________ Hospital admission: 08/16/2022 Hospital discharge: 08/28/2022 TCM f/u phone call: not  performed   Recommendations at discharge:    Follow-up with PCP in 1 to 2 weeks Follow-up with physician at the next home within 3 days of discharge Follow-up with Dr. Jule Ser, infectious disease specialist, on 09/10/2022 as scheduled   Discharge Diagnoses: Principal Problem:   MSSA bacteremia 08/16/2022 Active Problems:   Chronic diastolic CHF (congestive heart failure) ([place])   Hypothyroidism   Chronic headache   Seizure disorder ([place])   Persistent atrial fibrillation ([place])   COVID-19 08/12/22 completed Paxlovid 08/20/22     Relevant past medical, surgical, family and social history reviewed and updated as indicated. Interim medical history since our last visit reviewed. Allergies and medications reviewed and updated. Outpatient Medications Prior to Visit  Medication Sig Dispense Refill   acetaminophen (TYLENOL) 500 MG tablet Take 1,000 mg by mouth 3 (three) times daily.     amitriptyline (ELAVIL) 50 MG tablet TAKE TWO TABLETS BY MOUTH EVERYDAY AT BEDTIME (Patient taking differently: Take 100 mg by mouth at bedtime.) 180 tablet 2   apixaban (ELIQUIS) 5 MG TABS tablet Take 1 tablet (5 mg total) by mouth 2 (two) times daily. 60 tablet 0   atorvastatin (LIPITOR) 20 MG tablet TAKE ONE TABLET BY MOUTH ONCE DAILY 90 tablet 3   B Complex Vitamins (B-COMPLEX/B-12 PO) Take 1 tablet by mouth daily.     cetirizine (ZYRTEC) 10 MG tablet Take 1 tablet (10 mg total) by mouth daily. 30 tablet 0   Cholecalciferol (VITAMIN D3) 25 MCG (1000 UT) CAPS Take 1 capsule (1,000 Units total) by mouth daily. 90 capsule 1  ciprofloxacin (CIPRO) 500 MG tablet SMARTSIG:1 Tablet(s) By Mouth Every 12 Hours     clotrimazole (LOTRIMIN) 1 % cream Apply 1 Application topically 2 (two) times daily as needed (rash).     diphenhydrAMINE (BENADRYL) 25 mg capsule Take 25 mg by mouth every 6 (six) hours as needed for allergies.     divalproex (DEPAKOTE ER) 500 MG 24 hr tablet TAKE ONE TABLET BY MOUTH EVERYDAY AT  BEDTIME 30 tablet 5   EPINEPHrine 0.3 mg/0.3 mL IJ SOAJ injection Inject 0.3 mg into the muscle as needed for anaphylaxis. (bee stings) 2 each 1   ferrous sulfate 325 (65 FE) MG EC tablet Take 325 mg by mouth daily with breakfast.     fluticasone (FLONASE) 50 MCG/ACT nasal spray Place 2 sprays into both nostrils daily. 16 g 0   Fremanezumab-vfrm (AJOVY) 225 MG/1.5ML SOAJ Inject 225 mg into the skin every 30 (thirty) days. 1.68 mL 11   furosemide (LASIX) 20 MG tablet TAKE ONE TABLET BY MOUTH EVERY MORNING 90 tablet 1   gabapentin (NEURONTIN) 300 MG capsule TAKE ONE CAPSULE BY MOUTH EVERY MORNING and TAKE TWO CAPSULES BY MOUTH AT NOON and TAKE TWO CAPSULES BY MOUTH EVERY EVENING 450 capsule 1   HYDROcodone-acetaminophen (NORCO/VICODIN) 5-325 MG tablet Take 1 tablet by mouth every 6 (six) hours as needed for moderate pain.     levothyroxine (SYNTHROID) 112 MCG tablet Take 1 tablet (112 mcg total) by mouth daily before breakfast. 90 tablet 3   metFORMIN (GLUCOPHAGE) 500 MG tablet Take 500 mg by mouth daily.     methocarbamol (ROBAXIN) 500 MG tablet TAKE ONE TABLET BY MOUTH twice daily AS NEEDED FOR muscle SPASMS 20 tablet 2   metoprolol succinate (TOPROL-XL) 100 MG 24 hr tablet Take 1 tablet (100 mg total) by mouth at bedtime. Take with or immediately following a meal.     nitroGLYCERIN (NITROSTAT) 0.4 MG SL tablet Place 1 tablet (0.4 mg total) under the tongue every 5 (five) minutes as needed for chest pain. 20 tablet 3   omeprazole (PRILOSEC OTC) 20 MG tablet Take 20 mg by mouth daily.     ondansetron (ZOFRAN-ODT) 4 MG disintegrating tablet TAKE ONE TABLET BY MOUTH EVERY 8 HOURS AS NEEDED 20 tablet 3   pantoprazole (PROTONIX) 40 MG tablet Take 40 mg by mouth every other day.     tamsulosin (FLOMAX) 0.4 MG CAPS capsule Take 1 capsule (0.4 mg total) by mouth daily. 30 capsule 0   tiZANidine (ZANAFLEX) 4 MG tablet TAKE ONE TABLET BY MOUTH every SIX hours AS NEEDED FOR acute HEADACHE 20 tablet 1    Ubrogepant (UBRELVY) 100 MG TABS Take 100 mg by mouth as needed (take 1 tablet at onset of headache, may repeat in hours, max is 200 mg in 24 hours). (Patient taking differently: Take 100 mg by mouth as needed (Give 1 tablet by mouth every 12 hours as needed for headache).) 12 tablet 11   No facility-administered medications prior to visit.     Per HPI unless specifically indicated in ROS section below Review of Systems  Objective:  BP 126/62   Pulse 81   Temp 97.9 F (36.6 C) (Temporal)   Ht '5\' 8"'$  (1.727 m)   SpO2 94%   BMI 37.95 kg/m   Wt Readings from Last 3 Encounters:  08/27/22 249 lb 9 oz (113.2 kg)  08/24/22 249 lb 9.6 oz (113.2 kg)  08/12/22 235 lb (106.6 kg)      Physical Exam  Results for orders placed or performed during the hospital encounter of 08/26/22  Comprehensive metabolic panel  Result Value Ref Range   Sodium 135 135 - 145 mmol/L   Potassium 4.0 3.5 - 5.1 mmol/L   Chloride 104 98 - 111 mmol/L   CO2 25 22 - 32 mmol/L   Glucose, Bld 122 (H) 70 - 99 mg/dL   BUN 17 8 - 23 mg/dL   Creatinine, Ser 0.92 0.61 - 1.24 mg/dL   Calcium 8.1 (L) 8.9 - 10.3 mg/dL   Total Protein 6.2 (L) 6.5 - 8.1 g/dL   Albumin 2.5 (L) 3.5 - 5.0 g/dL   AST 20 15 - 41 U/L   ALT 21 0 - 44 U/L   Alkaline Phosphatase 66 38 - 126 U/L   Total Bilirubin 2.0 (H) 0.3 - 1.2 mg/dL   GFR, Estimated >60 >60 mL/min   Anion gap 6 5 - 15    Assessment & Plan:   Problem List Items Addressed This Visit   None    No orders of the defined types were placed in this encounter.  No orders of the defined types were placed in this encounter.    ***Follow up plan: No follow-ups on file.  [name], MD

## 2022-09-26 NOTE — Telephone Encounter (Signed)
Spoke with Manuela Schwartz informing her Dr. Darnell Level is giving verbal orders for services requested.

## 2022-09-26 NOTE — Patient Instructions (Addendum)
Labs today Return in 1-2 months for formal memory assessment.  For possible thrush - take oral nystatin swish/swallow sent to pharmacy.  Consider RSV and COVID shots at local pharmacy.

## 2022-09-27 ENCOUNTER — Encounter: Payer: Self-pay | Admitting: Family Medicine

## 2022-09-27 DIAGNOSIS — K219 Gastro-esophageal reflux disease without esophagitis: Secondary | ICD-10-CM | POA: Diagnosis not present

## 2022-09-27 DIAGNOSIS — N1831 Chronic kidney disease, stage 3a: Secondary | ICD-10-CM | POA: Diagnosis not present

## 2022-09-27 DIAGNOSIS — Z89412 Acquired absence of left great toe: Secondary | ICD-10-CM | POA: Diagnosis not present

## 2022-09-27 DIAGNOSIS — B9561 Methicillin susceptible Staphylococcus aureus infection as the cause of diseases classified elsewhere: Secondary | ICD-10-CM | POA: Diagnosis not present

## 2022-09-27 DIAGNOSIS — E039 Hypothyroidism, unspecified: Secondary | ICD-10-CM | POA: Diagnosis not present

## 2022-09-27 DIAGNOSIS — R7881 Bacteremia: Secondary | ICD-10-CM | POA: Diagnosis not present

## 2022-09-27 DIAGNOSIS — M1990 Unspecified osteoarthritis, unspecified site: Secondary | ICD-10-CM | POA: Diagnosis not present

## 2022-09-27 DIAGNOSIS — Z89422 Acquired absence of other left toe(s): Secondary | ICD-10-CM | POA: Diagnosis not present

## 2022-09-27 DIAGNOSIS — N390 Urinary tract infection, site not specified: Secondary | ICD-10-CM | POA: Diagnosis not present

## 2022-09-27 DIAGNOSIS — K760 Fatty (change of) liver, not elsewhere classified: Secondary | ICD-10-CM | POA: Diagnosis not present

## 2022-09-27 DIAGNOSIS — R233 Spontaneous ecchymoses: Secondary | ICD-10-CM | POA: Insufficient documentation

## 2022-09-27 DIAGNOSIS — I5032 Chronic diastolic (congestive) heart failure: Secondary | ICD-10-CM | POA: Diagnosis not present

## 2022-09-27 DIAGNOSIS — R531 Weakness: Secondary | ICD-10-CM | POA: Diagnosis not present

## 2022-09-27 DIAGNOSIS — R296 Repeated falls: Secondary | ICD-10-CM | POA: Diagnosis not present

## 2022-09-27 DIAGNOSIS — G8929 Other chronic pain: Secondary | ICD-10-CM | POA: Diagnosis not present

## 2022-09-27 DIAGNOSIS — Z8616 Personal history of COVID-19: Secondary | ICD-10-CM | POA: Diagnosis not present

## 2022-09-27 DIAGNOSIS — M549 Dorsalgia, unspecified: Secondary | ICD-10-CM | POA: Diagnosis not present

## 2022-09-27 DIAGNOSIS — J45909 Unspecified asthma, uncomplicated: Secondary | ICD-10-CM | POA: Diagnosis not present

## 2022-09-27 DIAGNOSIS — I4819 Other persistent atrial fibrillation: Secondary | ICD-10-CM | POA: Diagnosis not present

## 2022-09-27 LAB — RPR: RPR Ser Ql: NONREACTIVE

## 2022-09-27 MED ORDER — APIXABAN 5 MG PO TABS: 5.0000 mg | ORAL_TABLET | Freq: Two times a day (BID) | ORAL | 3 refills | Status: DC

## 2022-09-27 MED ORDER — TAMSULOSIN HCL 0.4 MG PO CAPS: 0.4000 mg | ORAL_CAPSULE | Freq: Every day | ORAL | 0 refills | Status: DC

## 2022-09-27 NOTE — Assessment & Plan Note (Signed)
Currently on ciprofloxacin '500mg'$  7-10d course for 2nd UTI this month. Denies ongoing UTI symptoms at this time.

## 2022-09-27 NOTE — Assessment & Plan Note (Addendum)
Latest MMSE 27/30 (06/2021). Stable period on last evaluation 12/2021. I have asked him to return in 1-2 months for repeat MMSE/further memory assessment.  He currently has [facility] involved, son is also involved. He has neurology f/u planned 11/2022.

## 2022-09-27 NOTE — Assessment & Plan Note (Signed)
Still tender but wound appears fully closed today.

## 2022-09-27 NOTE — Assessment & Plan Note (Signed)
Developed afib while acutely ill during hospitalization 08/16/2022. S/p cardioversion, he came home on eliquis. Continue this, refilled today.

## 2022-09-27 NOTE — Assessment & Plan Note (Signed)
Flomax started while in the hospital due to urinary retention - he continues this, foley catheter was removed.

## 2022-09-27 NOTE — Assessment & Plan Note (Addendum)
COVID symptoms largely resolved.

## 2022-09-27 NOTE — Assessment & Plan Note (Signed)
He has completed IV followed by oral antibiotic course for MSSA bacteremia. TEE was negative for endocarditis. He denies any fevers or other infectious symptoms at this time. Appreciate ID care.

## 2022-09-27 NOTE — Assessment & Plan Note (Addendum)
To left foot, unclear if history of trauma.  Check plt today.  Let me know if spreading.

## 2022-09-27 NOTE — Assessment & Plan Note (Signed)
Story/exam suspicious for this, he's been on multiple antibiotics in the past 2 months, currently completing cipro course.  Rx nystatin swish/swallow, if not resolved consider systemic treatment.

## 2022-09-27 NOTE — Telephone Encounter (Signed)
This was addressed in office with patient on 09/26/2022.  No further action needed at this time.

## 2022-09-27 NOTE — Assessment & Plan Note (Signed)
Continue lipitor. Now on eliquis.

## 2022-09-27 NOTE — Assessment & Plan Note (Signed)
Seems euvolemic.  

## 2022-09-28 ENCOUNTER — Telehealth: Payer: Self-pay

## 2022-09-28 ENCOUNTER — Other Ambulatory Visit: Payer: Self-pay | Admitting: Neurology

## 2022-09-28 ENCOUNTER — Other Ambulatory Visit: Payer: Self-pay | Admitting: Nurse Practitioner

## 2022-09-28 DIAGNOSIS — R0982 Postnasal drip: Secondary | ICD-10-CM

## 2022-09-28 DIAGNOSIS — J029 Acute pharyngitis, unspecified: Secondary | ICD-10-CM

## 2022-09-28 NOTE — Chronic Care Management (AMB) (Signed)
Chronic Care Management Pharmacy Assistant   Name: Nicholas [name]  MRN: [id number] DOB: 10-16-1950   Reason for Encounter: Medication Adherence and Delivery Coordination    Recent office visits:  09/26/22-Nicholas Gutierrez,MD(PCP)- hospital f/u,Now on eliquis. HH and family involved. Flomax started while in the hospital due to urinary retention - he continues this f/u 1-2 months.     Recent consult visits:  09/10/22-Nicholas Wallace,DO(inf dis)-hospital f/u- future labs ordered, f/u 3 weeks  Hospital visits:  Medication Reconciliation was completed by comparing discharge summary, patient's EMR and Pharmacy list, and upon discussion with patient.  Admitted to the ED on 08/26/22 due to MSSA bactermia. Discharge date was 08/28/22. Discharged from [facility].    Medication Changes at Hospital Discharge: -Changed Ancef  Metoprolol Succinate  Medications Discontinued at Hospital Discharge: -Stopped ketonazole shampoo  Nystatin creme  Medications that remain the same after Hospital Discharge:??  -All other medications will remain the same.    Medications: Outpatient Encounter Medications as of 09/28/2022  Medication Sig Note   acetaminophen (TYLENOL) 500 MG tablet Take 1,000 mg by mouth 3 (three) times daily.    amitriptyline (ELAVIL) 50 MG tablet TAKE TWO TABLETS BY MOUTH EVERYDAY AT BEDTIME (Patient taking differently: Take 100 mg by mouth at bedtime.)    apixaban (ELIQUIS) 5 MG TABS tablet Take 1 tablet (5 mg total) by mouth 2 (two) times daily.    atorvastatin (LIPITOR) 20 MG tablet TAKE ONE TABLET BY MOUTH ONCE DAILY    B Complex Vitamins (B-COMPLEX/B-12 PO) Take 1 tablet by mouth daily.    cetirizine (ZYRTEC) 10 MG tablet Take 1 tablet (10 mg total) by mouth daily.    Cholecalciferol (VITAMIN D3) 25 MCG (1000 UT) CAPS Take 1 capsule (1,000 Units total) by mouth daily.    ciprofloxacin (CIPRO) 500 MG tablet SMARTSIG:1 Tablet(s) By Mouth Every 12 Hours    clotrimazole  (LOTRIMIN) 1 % cream Apply 1 Application topically 2 (two) times daily as needed (rash).    diphenhydrAMINE (BENADRYL) 25 mg capsule Take 25 mg by mouth every 6 (six) hours as needed for allergies.    divalproex (DEPAKOTE ER) 500 MG 24 hr tablet TAKE ONE TABLET BY MOUTH EVERYDAY AT BEDTIME    EPINEPHrine 0.3 mg/0.3 mL IJ SOAJ injection Inject 0.3 mg into the muscle as needed for anaphylaxis. (bee stings)    ferrous sulfate 325 (65 FE) MG EC tablet Take 325 mg by mouth daily with breakfast.    fluticasone (FLONASE) 50 MCG/ACT nasal spray Place 2 sprays into both nostrils daily.    Fremanezumab-vfrm (AJOVY) 225 MG/1.5ML SOAJ Inject 225 mg into the skin every 30 (thirty) days. 08/27/2022: Dose due today, per Patient   furosemide (LASIX) 20 MG tablet TAKE ONE TABLET BY MOUTH EVERY MORNING    gabapentin (NEURONTIN) 300 MG capsule TAKE ONE CAPSULE BY MOUTH EVERY MORNING and TAKE TWO CAPSULES BY MOUTH AT NOON and TAKE TWO CAPSULES BY MOUTH EVERY EVENING    HYDROcodone-acetaminophen (NORCO/VICODIN) 5-325 MG tablet Take 1 tablet by mouth every 6 (six) hours as needed for moderate pain.    levothyroxine (SYNTHROID) 112 MCG tablet Take 1 tablet (112 mcg total) by mouth daily before breakfast.    metFORMIN (GLUCOPHAGE) 500 MG tablet Take 500 mg by mouth daily.    methocarbamol (ROBAXIN) 500 MG tablet TAKE ONE TABLET BY MOUTH twice daily AS NEEDED FOR muscle SPASMS    metoprolol succinate (TOPROL-XL) 100 MG 24 hr tablet Take 1 tablet (100 mg total) by  mouth at bedtime. Take with or immediately following a meal.    nitroGLYCERIN (NITROSTAT) 0.4 MG SL tablet Place 1 tablet (0.4 mg total) under the tongue every 5 (five) minutes as needed for chest pain.    nystatin (MYCOSTATIN) 100000 UNIT/ML suspension Take 5 mLs (500,000 Units total) by mouth 4 (four) times daily.    ondansetron (ZOFRAN-ODT) 4 MG disintegrating tablet TAKE ONE TABLET BY MOUTH EVERY 8 HOURS AS NEEDED    pantoprazole (PROTONIX) 40 MG tablet Take 40  mg by mouth every other day.    tamsulosin (FLOMAX) 0.4 MG CAPS capsule Take 1 capsule (0.4 mg total) by mouth daily.    tiZANidine (ZANAFLEX) 4 MG tablet TAKE ONE TABLET BY MOUTH every SIX hours AS NEEDED FOR acute HEADACHE    Ubrogepant (UBRELVY) 100 MG TABS Take 100 mg by mouth as needed (take 1 tablet at onset of headache, may repeat in hours, max is 200 mg in 24 hours). (Patient taking differently: Take 100 mg by mouth as needed (Give 1 tablet by mouth every 12 hours as needed for headache).)    No facility-administered encounter medications on file as of 09/28/2022.    BP Readings from Last 3 Encounters:  09/26/22 126/62  09/10/22 111/74  08/28/22 99/62    Pulse Readings from Last 3 Encounters:  09/26/22 81  09/10/22 97  08/28/22 80    Lab Results  Component Value Date/Time   HGBA1C 5.6 07/13/2010 08:12 PM   HGBA1C  12/08/2008 04:33 PM    5.2 (NOTE)   The ADA recommends the following therapeutic goal for glycemic   control related to Hgb A1C measurement:   Goal of Therapy:   < 7.0% Hgb A1C   Reference: American Diabetes Association: Clinical Practice   Recommendations 2008, Diabetes Care,  2008, 31:(Suppl 1).   Lab Results  Component Value Date   CREATININE 1.21 09/26/2022   BUN 28 (H) 09/26/2022   GFR 60.10 09/26/2022   GFRNONAA >60 08/27/2022   GFRAA >60 12/05/2018   NA 140 09/26/2022   K 4.0 09/26/2022   CALCIUM 8.4 09/26/2022   CO2 30 09/26/2022     Last adherence delivery date: SNF      Patient is due for next adherence delivery on: 10/02/22  Spoke with patient on 09/28/22 reviewed medications and coordinated delivery.  This delivery to include: Adherence Packaging  30 Days  Gabapentin '300mg'$  - take 1 tablet breakfast, 2 tablets lunch,2 tablets evening meal Furosemide '20mg'$  - take 1 tablet daily breakfast  daily Pantoprazole '40mg'$  - take 1 tablet at breakfast Atorvastatin '20mg'$  - take 1 tablet at breakfast Vitamin D3 38mg - take 1 capsule breakfast Ferrous  sulfate 324 mg - take 1 tablet at breakfast  daily  Metoprolol succinate '100mg'$  - take 1 tablet at bedtime  Levothyroxine 112 mcg - take 1 tablet at breakfast Amitriptyline '50mg'$  - take 2 tablets at bedtime Divalproex '500mg'$ - take 1 tablet bedtim Eliquis '5mg'$  - take 1 tablet breakfast, 1 table bedtime   Vials:  Methocarbamol '500mg'$ - take 1 tablet 2 times daily as needed  Tizanidine '4mg'$ - take 1 tablet every 6 hours for HA as needed Ubrelvy-'100mg'$  -use as needed Zofran '4mg'$ - take 1 tablet as needed for nausea  Patient declined the following medications this month:    Any concerns about your medications? No  How often do you forget or accidentally miss a dose? Rarely  Do you use a pillbox? No  Is patient in packaging Yes  Any concerns or  issues with your packaging? Patient just got discharged from SNF   Refills requested from providers include:Ondansetron,tizanidine,tamsulosin,cetirizine  Confirmed delivery date of 10/02/22, advised patient that pharmacy will contact them the morning of delivery.  Recent blood pressure readings are as follows:none available  Recent blood glucose readings are as follows:none available   Annual wellness visit in last year? Yes Most Recent BP reading:126/62   Cycle dispensing form sent to [facility], CPP for review. Next CCM appointment: 10/22/2022  [name], CPP notified  [name], [place]  [phone]

## 2022-09-28 NOTE — Progress Notes (Signed)
  Care Coordination   Note   09/28/2022 Name: Nicholas [name] MRN: [id number] DOB: 02-May-1951  Nicholas [name] is a 71 y.o. year old male who sees [name], MD for primary care. I reached out to [name] by phone today to offer care coordination services.  [name] was given information about Care Coordination services today including:   The Care Coordination services include support from the care team which includes your Nurse Coordinator, Clinical Social Worker, or Pharmacist.  The Care Coordination team is here to help remove barriers to the health concerns and goals most important to you. Care Coordination services are voluntary, and the patient may decline or stop services at any time by request to their care team member.   Care Coordination Consent Status: Patient agreed to services and verbal consent obtained.   Follow up plan:  Telephone appointment with care coordination team member scheduled for:  10/01/2022  Encounter Outcome:  Pt. Scheduled from referral   Julian Hy, [place] Direct Dial: [phone]

## 2022-10-01 ENCOUNTER — Emergency Department
Admission: EM | Admit: 2022-10-01 | Discharge: 2022-10-04 | Disposition: A | Discharge status: Home / Self Care | Payer: Medicare Other | Attending: Emergency Medicine | Admitting: Emergency Medicine

## 2022-10-01 ENCOUNTER — Emergency Department: Payer: Medicare Other

## 2022-10-01 ENCOUNTER — Telehealth: Payer: Self-pay

## 2022-10-01 ENCOUNTER — Ambulatory Visit: Payer: Self-pay

## 2022-10-01 DIAGNOSIS — I509 Heart failure, unspecified: Secondary | ICD-10-CM | POA: Diagnosis not present

## 2022-10-01 DIAGNOSIS — Z7901 Long term (current) use of anticoagulants: Secondary | ICD-10-CM | POA: Diagnosis not present

## 2022-10-01 DIAGNOSIS — M6281 Muscle weakness (generalized): Secondary | ICD-10-CM | POA: Insufficient documentation

## 2022-10-01 DIAGNOSIS — Z8616 Personal history of COVID-19: Secondary | ICD-10-CM | POA: Insufficient documentation

## 2022-10-01 DIAGNOSIS — G319 Degenerative disease of nervous system, unspecified: Secondary | ICD-10-CM | POA: Diagnosis not present

## 2022-10-01 DIAGNOSIS — E039 Hypothyroidism, unspecified: Secondary | ICD-10-CM | POA: Diagnosis not present

## 2022-10-01 DIAGNOSIS — I2510 Atherosclerotic heart disease of native coronary artery without angina pectoris: Secondary | ICD-10-CM | POA: Insufficient documentation

## 2022-10-01 DIAGNOSIS — Z20822 Contact with and (suspected) exposure to covid-19: Secondary | ICD-10-CM | POA: Insufficient documentation

## 2022-10-01 DIAGNOSIS — S0990XA Unspecified injury of head, initial encounter: Secondary | ICD-10-CM | POA: Diagnosis not present

## 2022-10-01 DIAGNOSIS — I4891 Unspecified atrial fibrillation: Secondary | ICD-10-CM | POA: Diagnosis not present

## 2022-10-01 DIAGNOSIS — Z743 Need for continuous supervision: Secondary | ICD-10-CM | POA: Diagnosis not present

## 2022-10-01 DIAGNOSIS — M47814 Spondylosis without myelopathy or radiculopathy, thoracic region: Secondary | ICD-10-CM | POA: Diagnosis not present

## 2022-10-01 DIAGNOSIS — R531 Weakness: Secondary | ICD-10-CM | POA: Insufficient documentation

## 2022-10-01 DIAGNOSIS — I700 Atherosclerosis of aorta: Secondary | ICD-10-CM | POA: Diagnosis not present

## 2022-10-01 DIAGNOSIS — Z9889 Other specified postprocedural states: Secondary | ICD-10-CM | POA: Diagnosis not present

## 2022-10-01 LAB — CBC WITH DIFFERENTIAL/PLATELET
Abs Immature Granulocytes: 0.1 10*3/uL — ABNORMAL HIGH (ref 0.00–0.07)
Basophils Absolute: 0 10*3/uL (ref 0.0–0.1)
Basophils Relative: 0 %
Eosinophils Absolute: 0.3 10*3/uL (ref 0.0–0.5)
Eosinophils Relative: 2 %
HCT: 37.8 % — ABNORMAL LOW (ref 39.0–52.0)
Hemoglobin: 12.1 g/dL — ABNORMAL LOW (ref 13.0–17.0)
Immature Granulocytes: 1 %
Lymphocytes Relative: 5 %
Lymphs Abs: 0.8 10*3/uL (ref 0.7–4.0)
MCH: 31.1 pg (ref 26.0–34.0)
MCHC: 32 g/dL (ref 30.0–36.0)
MCV: 97.2 fL (ref 80.0–100.0)
Monocytes Absolute: 2.2 10*3/uL — ABNORMAL HIGH (ref 0.1–1.0)
Monocytes Relative: 14 %
Neutro Abs: 12.9 10*3/uL — ABNORMAL HIGH (ref 1.7–7.7)
Neutrophils Relative %: 78 %
Platelets: 197 10*3/uL (ref 150–400)
RBC: 3.89 MIL/uL — ABNORMAL LOW (ref 4.22–5.81)
RDW: 14.5 % (ref 11.5–15.5)
WBC: 16.4 10*3/uL — ABNORMAL HIGH (ref 4.0–10.5)
nRBC: 0 % (ref 0.0–0.2)

## 2022-10-01 LAB — URINALYSIS, ROUTINE W REFLEX MICROSCOPIC
Bacteria, UA: NONE SEEN
Bilirubin Urine: NEGATIVE
Glucose, UA: NEGATIVE mg/dL
Ketones, ur: NEGATIVE mg/dL
Leukocytes,Ua: NEGATIVE
Nitrite: NEGATIVE
Protein, ur: NEGATIVE mg/dL
Specific Gravity, Urine: 1.01 (ref 1.005–1.030)
Squamous Epithelial / HPF: NONE SEEN (ref 0–5)
pH: 6 (ref 5.0–8.0)

## 2022-10-01 LAB — COMPREHENSIVE METABOLIC PANEL
ALT: 31 U/L (ref 0–44)
AST: 31 U/L (ref 15–41)
Albumin: 3 g/dL — ABNORMAL LOW (ref 3.5–5.0)
Alkaline Phosphatase: 86 U/L (ref 38–126)
Anion gap: 13 (ref 5–15)
BUN: 16 mg/dL (ref 8–23)
CO2: 24 mmol/L (ref 22–32)
Calcium: 8.6 mg/dL — ABNORMAL LOW (ref 8.9–10.3)
Chloride: 97 mmol/L — ABNORMAL LOW (ref 98–111)
Creatinine, Ser: 0.84 mg/dL (ref 0.61–1.24)
GFR, Estimated: >60 mL/min (ref >60)
Glucose, Bld: 113 mg/dL — ABNORMAL HIGH (ref 70–99)
Potassium: 3.7 mmol/L (ref 3.5–5.1)
Sodium: 134 mmol/L — ABNORMAL LOW (ref 135–145)
Total Bilirubin: 2.1 mg/dL — ABNORMAL HIGH (ref 0.3–1.2)
Total Protein: 7.5 g/dL (ref 6.5–8.1)

## 2022-10-01 LAB — TROPONIN I (HIGH SENSITIVITY)
Troponin I (High Sensitivity): 4 ng/L (ref <18)
Troponin I (High Sensitivity): 3 ng/L (ref <18)

## 2022-10-01 LAB — RESP PANEL BY RT-PCR (RSV, FLU A&B, COVID)  RVPGX2
Influenza A by PCR: NEGATIVE
Influenza B by PCR: NEGATIVE
Resp Syncytial Virus by PCR: NEGATIVE
SARS Coronavirus 2 by RT PCR: NEGATIVE

## 2022-10-01 LAB — LACTIC ACID, PLASMA
Lactic Acid, Venous: 1.6 mmol/L (ref 0.5–1.9)
Lactic Acid, Venous: 1.4 mmol/L (ref 0.5–1.9)

## 2022-10-01 NOTE — Patient Outreach (Signed)
  Care Coordination   10/01/2022 Name: Nicholas [name] MRN: [id number] DOB: 25-Apr-1951   Care Coordination Outreach Attempts:  An unsuccessful telephone outreach was attempted for a scheduled appointment today. HIPAA compliant voice message left with call back phone number and return call request.   Follow Up Plan:  Additional outreach attempts will be made to offer the patient care coordination information and services.   Encounter Outcome:  No Answer   Care Coordination Interventions:  No, not indicated    Quinn Plowman [facility] [place] [phone] direct line

## 2022-10-01 NOTE — Patient Outreach (Signed)
  Care Coordination   Initial Visit Note   10/01/2022 Name: Nicholas [name] MRN: [id number] DOB: October 26, 1950  Nicholas [name] is a 71 y.o. year old male who sees Nicholas Bush, MD for primary care. I spoke with  Nicholas [name] by phone today.  What matters to the patients health and wellness today?  Care coordination assistance and health condition education    Goals Addressed             This Visit's Progress    Care coordination and education regarding health conditions       Care Coordination Interventions: Evaluation of current treatment plan related to urinary tract infection, memory difficulty and new diagnosis of atrial fibrillation and patient's adherence to plan as established by provider.  Patient reports he was recently discharged on 09/22/22 from SNF post hospitalization for bacteremia.  Patient states he is currently receiving home health services with Nicholas [name].  Patient states he lives alone. He reports having assistance from his son who provides his transportation.  He states he has assistance from someone at his apartment complex that come and cooks him 2 meals per day.  Patient reports using electric wheelchair and walker for ambulation assistance.  Provided education to patient re:  signs/ symptoms of atrial fibrillation  Reviewed medications with patient and discussed importance of compliance:  Patient reports having Nicholas [name] for medication management.  Reviewed scheduled/upcoming provider appointments:  Per chart review patient has next follow up with infectious disease provider on 10/03/22, with Nicholas [name] on 10/04/22 and is scheduled to have a follow up with his primary care provider in 1-2 weeks. Call to [facility] [name] home health to inquire of patients next visit for service:  Patient states he is unsure when next home health service visits is scheduled.  Call to Nicholas [name] at Nicholas [name] who states patients next follow up visit from skilled nurse and PT is on 10/02/22 and  social worker and OT will call patient to arrange visits for this week.  Patient contacted and updated with this information.  Discussed plans with patient for ongoing care management follow up and provided patient with direct contact information for care management team:  Patient verbally agreed to next telephone outreach call with RNCM on 10/25/22.          SDOH assessments and interventions completed:  Yes  SDOH Interventions Today    Flowsheet Row Most Recent Value  SDOH Interventions   Food Insecurity Interventions Intervention Not Indicated  Housing Interventions Intervention Not Indicated  Transportation Interventions Intervention Not Indicated        Care Coordination Interventions:  No, not indicated   Follow up plan: Follow up call scheduled for 10/25/22    Encounter Outcome:  Pt. Visit Completed   Nicholas Plowman RN,BSN,CCM [place] [phone] direct line

## 2022-10-01 NOTE — ED Triage Notes (Signed)
Pt brought in via EMS w c/o not being able to take care of hisself at home. Pt was placed in rehab a few weeks ago and released back to home. Pt now has been having difficulty walking, getting up and down from his chair at home. Pt does use a walker. Pt A&O x 4 on arrival to ER.

## 2022-10-01 NOTE — ED Provider Notes (Signed)
[facility] Provider Note    Event Date/Time   First MD Initiated Contact with Patient 10/01/22 2026     (approximate)   History   Weakness   HPI  Nicholas [name] is a 71 y.o. male   Past medical history of history of CHF, CAD, hypothyroidism, chronic pain, UTIs, A fibrillation on Eliquis, and 3 recent hospitalizations in the past several months notably at the end of October for COVID-pneumonia, beginning of November for MSSA bacteremia, and middle of November when he was unable to manage his IV antibiotics so was sent to [facility] to complete IV antibiotics where he experienced 2 bouts of urinary tract infection completed antibiotics.  He comes to the emergency department today due to generalized weakness and requesting to go back to a short-term rehab to regain his strength.  He states he has difficulty getting around his house at baseline but has gotten weaker in the last week since coming home from rehab and lives alone.  He denies any focal complaints including pain, respiratory symptoms, urinary symptoms, bleeding.  He denies any falls or trauma.  History was obtained via the patient.      Physical Exam   Triage Vital Signs: ED Triage Vitals  Enc Vitals Group     BP 10/01/22 2038 114/76     Pulse Rate 10/01/22 2038 100     Resp 10/01/22 2038 16     Temp 10/01/22 2038 99.1 F (37.3 C)     Temp Source 10/01/22 2038 Oral     SpO2 10/01/22 2038 98 %     Weight 10/01/22 2039 220 lb (99.8 kg)     Height 10/01/22 2039 '5\' 8"'$  (1.727 m)     Head Circumference --      Peak Flow --      Pain Score 10/01/22 2038 0     Pain Loc --      Pain Edu? --      Excl. in [place]? --     Most recent vital signs: Vitals:   10/01/22 2038  BP: 114/76  Pulse: 100  Resp: 16  Temp: 99.1 F (37.3 C)  SpO2: 98%    General: Awake, no distress.  CV:  Good peripheral perfusion.  Resp:  Normal effort.  Abd:  No distention.  Other:  Is normal strength  and sensation throughout, no facial asymmetry, alert and oriented and pleasant.  Hemodynamics reviewed and within normal limits, lungs clear to auscultation without focality or wheezing, abdomen soft and nontender, his foot has remote amputations of the toes and there is no signs of infection.   ED Results / Procedures / Treatments   Labs (all labs ordered are listed, but only abnormal results are displayed) Labs Reviewed  COMPREHENSIVE METABOLIC PANEL - Abnormal; Notable for the following components:      Result Value   Sodium 134 (*)    Chloride 97 (*)    Glucose, Bld 113 (*)    Calcium 8.6 (*)    Albumin 3.0 (*)    Total Bilirubin 2.1 (*)    All other components within normal limits  CBC WITH DIFFERENTIAL/PLATELET - Abnormal; Notable for the following components:   WBC 16.4 (*)    RBC 3.89 (*)    Hemoglobin 12.1 (*)    HCT 37.8 (*)    Neutro Abs 12.9 (*)    Monocytes Absolute 2.2 (*)    Abs Immature Granulocytes 0.10 (*)    All other  components within normal limits  URINALYSIS, ROUTINE W REFLEX MICROSCOPIC - Abnormal; Notable for the following components:   Color, Urine YELLOW (*)    APPearance CLEAR (*)    Hgb urine dipstick SMALL (*)    All other components within normal limits  RESP PANEL BY RT-PCR (RSV, FLU A&B, COVID)  RVPGX2  LACTIC ACID, PLASMA  LACTIC ACID, PLASMA  TSH  T4, FREE  TROPONIN I (HIGH SENSITIVITY)  TROPONIN I (HIGH SENSITIVITY)     I reviewed labs and they are notable for his white blood cell count 16.4 which is slightly elevated from prior.  His hemoglobin is 12.1 which is down from 13.4 approximately 1 week ago.  EKG  ED ECG REPORT I, Lucillie Garfinkel, the attending physician, personally viewed and interpreted this ECG.   Date: 10/01/2022  EKG Time: 2107  Rate: 97  Rhythm: normal sinus rhythm  Axis: nl  Intervals:none  ST&T Change: no acute ischemic changes    RADIOLOGY I independently reviewed and interpreted chest x-ray and see no  obvious focal consolidations or pneumothorax   PROCEDURES:  Critical Care performed: No  Procedures   MEDICATIONS ORDERED IN ED: Medications - No data to display   IMPRESSION / MDM / [place] / ED COURSE  I reviewed the triage vital signs and the nursing notes.                              Differential diagnosis includes, but is not limited to, infection, metabolic derangement, dehydration, AKI, CVA, deconditioning, ACS, thyroid dysfunction, considered but doubt traumatic injuries no reports of trauma.   MDM: This is a patient with multiple recent hospitalizations with generalized weakness and failure to take care of himself at home, difficulty getting up and around to perform activities of daily living.  He has no focal neurological deficits and no reports of trauma so I doubt CVA or head bleed.  Metabolic infectious workup thus far has been normal, no signs of urinary tract infection, lung infection, AKI.  EKG is nonischemic and initial troponin is negative.  No signs of urinary tract infection.  Hemoglobin is decreased slightly from prior, he denies bleeding, rectal exam shows on stool no melena or gross blood. Is awaiting thyroid test. If workup medically is unremarkable, will consult TOC for placement.  When I interviewed the patient once more to inform him of his results, he states that he slid out of bed the other day but denies head strike.  Will add on CT head to assess for intracranial bleeding given this patient is on Eliquis.    Patient's presentation is most consistent with acute presentation with potential threat to life or bodily function.       FINAL CLINICAL IMPRESSION(S) / ED DIAGNOSES   Final diagnoses:  Generalized weakness     Rx / DC Orders   ED Discharge Orders     None        Note:  This document was prepared using Dragon voice recognition software and may include unintentional dictation errors.    Lucillie Garfinkel, MD 10/01/22  [phone]

## 2022-10-02 ENCOUNTER — Telehealth: Payer: Self-pay | Admitting: Family Medicine

## 2022-10-02 ENCOUNTER — Other Ambulatory Visit: Payer: Self-pay

## 2022-10-02 LAB — VALPROIC ACID LEVEL: Valproic Acid Lvl: 17 ug/mL — ABNORMAL LOW (ref 50.0–100.0)

## 2022-10-02 LAB — T4, FREE: Free T4: 1.09 ng/dL (ref 0.61–1.12)

## 2022-10-02 LAB — TSH: TSH: 3.139 u[IU]/mL (ref 0.350–4.500)

## 2022-10-02 MED ORDER — LEVOTHYROXINE SODIUM 112 MCG PO TABS
112.0000 ug | ORAL_TABLET | Freq: Every day | ORAL | Status: DC
  Administered 2022-10-02 – 2022-10-04 (×3): 112 ug via ORAL
  Filled 2022-10-02 (×3): qty 1

## 2022-10-02 MED ORDER — METFORMIN HCL 500 MG PO TABS
500.0000 mg | ORAL_TABLET | Freq: Every day | ORAL | Status: DC
  Administered 2022-10-02 – 2022-10-04 (×3): 500 mg via ORAL
  Filled 2022-10-02 (×3): qty 1

## 2022-10-02 MED ORDER — TAMSULOSIN HCL 0.4 MG PO CAPS
0.4000 mg | ORAL_CAPSULE | Freq: Every day | ORAL | Status: DC
  Administered 2022-10-02 – 2022-10-04 (×3): 0.4 mg via ORAL
  Filled 2022-10-02 (×3): qty 1

## 2022-10-02 MED ORDER — ATORVASTATIN CALCIUM 20 MG PO TABS
20.0000 mg | ORAL_TABLET | Freq: Every day | ORAL | Status: DC
  Administered 2022-10-02 – 2022-10-04 (×3): 20 mg via ORAL
  Filled 2022-10-02 (×4): qty 1

## 2022-10-02 MED ORDER — FUROSEMIDE 40 MG PO TABS
20.0000 mg | ORAL_TABLET | Freq: Every morning | ORAL | Status: DC
  Administered 2022-10-02 – 2022-10-04 (×3): 20 mg via ORAL
  Filled 2022-10-02 (×3): qty 1

## 2022-10-02 MED ORDER — APIXABAN 5 MG PO TABS
5.0000 mg | ORAL_TABLET | Freq: Two times a day (BID) | ORAL | Status: DC
  Administered 2022-10-02 – 2022-10-04 (×6): 5 mg via ORAL
  Filled 2022-10-02 (×6): qty 1

## 2022-10-02 MED ORDER — GABAPENTIN 300 MG PO CAPS
300.0000 mg | ORAL_CAPSULE | Freq: Every morning | ORAL | Status: DC
  Administered 2022-10-02 – 2022-10-03 (×2): 300 mg via ORAL
  Filled 2022-10-02 (×2): qty 1

## 2022-10-02 MED ORDER — NYSTATIN 100000 UNIT/ML MT SUSP
5.0000 mL | Freq: Four times a day (QID) | OROMUCOSAL | Status: DC
  Administered 2022-10-02 – 2022-10-04 (×8): 500000 [IU] via ORAL
  Filled 2022-10-02 (×8): qty 5

## 2022-10-02 MED ORDER — PANTOPRAZOLE SODIUM 40 MG PO TBEC
40.0000 mg | DELAYED_RELEASE_TABLET | ORAL | Status: DC
  Administered 2022-10-02 – 2022-10-04 (×2): 40 mg via ORAL
  Filled 2022-10-02 (×2): qty 1

## 2022-10-02 MED ORDER — ACETAMINOPHEN 500 MG PO TABS
1000.0000 mg | ORAL_TABLET | Freq: Three times a day (TID) | ORAL | Status: DC | PRN
  Administered 2022-10-02 – 2022-10-04 (×4): 1000 mg via ORAL
  Filled 2022-10-02 (×5): qty 2

## 2022-10-02 MED ORDER — METOPROLOL SUCCINATE ER 50 MG PO TB24
100.0000 mg | ORAL_TABLET | Freq: Every day | ORAL | Status: DC
  Administered 2022-10-02 – 2022-10-04 (×3): 100 mg via ORAL
  Filled 2022-10-02 (×4): qty 2

## 2022-10-02 MED ORDER — FERROUS SULFATE 325 (65 FE) MG PO TABS
325.0000 mg | ORAL_TABLET | Freq: Every day | ORAL | Status: DC
  Administered 2022-10-02 – 2022-10-04 (×3): 325 mg via ORAL
  Filled 2022-10-02 (×3): qty 1

## 2022-10-02 MED ORDER — LORATADINE 10 MG PO TABS
10.0000 mg | ORAL_TABLET | Freq: Every day | ORAL | Status: DC
  Administered 2022-10-02 – 2022-10-04 (×3): 10 mg via ORAL
  Filled 2022-10-02 (×3): qty 1

## 2022-10-02 MED ORDER — GABAPENTIN 300 MG PO CAPS
600.0000 mg | ORAL_CAPSULE | Freq: Every day | ORAL | Status: DC
  Administered 2022-10-02 – 2022-10-04 (×3): 600 mg via ORAL
  Filled 2022-10-02 (×3): qty 2

## 2022-10-02 MED ORDER — AMITRIPTYLINE HCL 50 MG PO TABS
100.0000 mg | ORAL_TABLET | Freq: Every day | ORAL | Status: DC
  Administered 2022-10-02 – 2022-10-03 (×3): 100 mg via ORAL
  Filled 2022-10-02 (×3): qty 2

## 2022-10-02 MED ORDER — ONDANSETRON 4 MG PO TBDP: 4.0000 mg | ORAL_TABLET | Freq: Three times a day (TID) | ORAL | Status: DC | PRN

## 2022-10-02 MED ORDER — GABAPENTIN 300 MG PO CAPS
600.0000 mg | ORAL_CAPSULE | Freq: Every day | ORAL | Status: DC
  Administered 2022-10-02 – 2022-10-03 (×2): 600 mg via ORAL
  Filled 2022-10-02 (×2): qty 2

## 2022-10-02 MED ORDER — DIVALPROEX SODIUM ER 250 MG PO TB24
500.0000 mg | ORAL_TABLET | Freq: Every day | ORAL | Status: DC
  Administered 2022-10-02 – 2022-10-03 (×3): 500 mg via ORAL
  Filled 2022-10-02 (×3): qty 2

## 2022-10-02 NOTE — Evaluation (Signed)
Occupational Therapy Evaluation Patient Details Name: Nicholas [name] MRN: [id number] DOB: 1950/11/26 Today's Date: 10/02/2022   History of Present Illness Nicholas [name] is a 89, past medical history CAD/MI, GERD, PFO, remote history of seizure, questionable history of stroke/CHF.  Presented to the ED due to inability to care for himself at home.   Clinical Impression   Patient presenting with decreased independence in self care, balance, functional mobility/transfers, endurance, and safety awareness. Prior to admission pt was living in [place], reports being Mod I for ADLs (sponge bathes at the sink), and gets groceries/medications delivered. Pt currently functioning at [place] guard for bed mobility, Min A to stand from stretcher, CGA-Min A to take lateral steps toward HOB using RW, and Max A for LB dressing. Pt will benefit from acute OT to increase overall independence in the areas of ADLs and functional mobility in order to safely discharge to next venue of care. Upon hospital discharge, recommend STR to maximize pt safety and return to PLOF.    Recommendations for follow up therapy are one component of a multi-disciplinary discharge planning process, led by the attending physician.  Recommendations may be updated based on patient status, additional functional criteria and insurance authorization.   Follow Up Recommendations  Skilled nursing-short term rehab (<3 hours/day)     Assistance Recommended at Discharge Frequent or constant Supervision/Assistance  Patient can return home with the following A lot of help with bathing/dressing/bathroom;A lot of help with walking and/or transfers;Assistance with cooking/housework;Assist for transportation;Help with stairs or ramp for entrance    Functional Status Assessment  Patient has had a recent decline in their functional status and demonstrates the ability to make significant improvements in function in a reasonable and predictable amount of time.   Equipment Recommendations  Other (comment) (defer to next venue of care)    Recommendations for Other Services       Precautions / Restrictions Precautions Precautions: Fall Restrictions Weight Bearing Restrictions: No      Mobility Bed Mobility Overal bed mobility: Needs Assistance Bed Mobility: Sit to Supine, Supine to Sit     Supine to sit: Min guard, HOB elevated Sit to supine: Min guard        Transfers Overall transfer level: Needs assistance Equipment used: Rolling walker (2 wheels) Transfers: Sit to/from Stand Sit to Stand: Min assist           General transfer comment: STS from stretcher      Balance Overall balance assessment: Needs assistance Sitting-balance support: Feet supported, Bilateral upper extremity supported Sitting balance-Leahy Scale: Good     Standing balance support: Bilateral upper extremity supported, Reliant on assistive device for balance Standing balance-Leahy Scale: Fair                             ADL either performed or assessed with clinical judgement   ADL Overall ADL's : Needs assistance/impaired     Grooming: Set up;Sitting               Lower Body Dressing: Maximal assistance;Bed level Lower Body Dressing Details (indicate cue type and reason): socks Toilet Transfer: Minimal assistance;Rolling walker (2 wheels) Toilet Transfer Details (indicate cue type and reason): simulated with STS from stretcher         Functional mobility during ADLs: Min guard;Minimal assistance;Rolling walker (2 wheels) (to take lateral steps toward [facility])       Vision Baseline Vision/History: 1 Wears glasses Patient Visual  Report: No change from baseline       Perception     Praxis      Pertinent Vitals/Pain Pain Assessment Pain Assessment: No/denies pain     Hand Dominance Right   Extremity/Trunk Assessment Upper Extremity Assessment Upper Extremity Assessment: Generalized weakness   Lower Extremity  Assessment Lower Extremity Assessment: Generalized weakness;RLE deficits/detail RLE Deficits / Details: pt endorses previous surgery where metal rod was placed, unable to complete knee flexion (fixed in extension)       Communication Communication Communication: HOH   Cognition Arousal/Alertness: Awake/alert Behavior During Therapy: WFL for tasks assessed/performed, Flat affect Overall Cognitive Status: No family/caregiver present to determine baseline cognitive functioning                                 General Comments: Pt oriented to self/time/situation, disoriented to place (reported he was at the "bus depot" in [place]. Slight difficulty with word finding and slow procressing.     General Comments       Exercises Other Exercises Other Exercises: OT provided education re: role of OT, OT POC, post acute recs, sitting up for all meals, EOB/OOB mobility with assistance, home/fall safety.     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 Pt reports family lives in [place], has a son.      Prior Functioning/Environment Prior Level of Function : Independent/Modified Independent;History of Falls (last six months)             Mobility Comments: Pt ambulatory with rollator but reporting many recent falls (reported 12 in the past 6 months). ADLs Comments: Pt reports Mod I for ADLs (states he can  complete but takes increased time), has been taking sponge baths at sink recently, gets groceries and medications delivered, not driving        OT Problem List: Decreased strength;Decreased range of motion;Decreased activity tolerance;Impaired balance (sitting and/or standing);Decreased safety awareness;Decreased cognition      OT Treatment/Interventions: Self-care/ADL training;Therapeutic activities;Therapeutic exercise;Energy conservation;DME and/or AE instruction;Patient/family education;Balance training    OT Goals(Current goals can be found in the care plan section) Acute Rehab OT Goals Patient Stated Goal: pt agreeable to STR OT Goal Formulation: With patient Time For Goal Achievement: 10/16/22 Potential to Achieve Goals: Good   OT Frequency: Min 2X/week    Co-evaluation              AM-PAC OT "6 Clicks" Daily Activity     Outcome Measure Help from another person eating meals?: None Help from another person taking care of personal grooming?: A Little Help from another person toileting, which includes using toliet, bedpan, or urinal?: A Lot Help from another person bathing (including washing, rinsing, drying)?: A Lot Help from another person to put on and taking off regular upper body clothing?: A Little Help from another person to put on and taking off regular lower body clothing?: A Lot 6 Click Score: 16   End of Session  Equipment Utilized During Treatment: Gait belt;Rolling walker (2 wheels) Nurse Communication: Mobility status  Activity Tolerance: Patient tolerated treatment well Patient left: in bed;with call bell/phone within reach  OT Visit Diagnosis: Other abnormalities of gait and mobility (R26.89);Muscle weakness (generalized) (M62.81)                Time: 7858-8502 OT Time Calculation (min): 26 min Charges:  OT General Charges $OT Visit: 1 Visit OT Evaluation $OT Eval Low Complexity: 1 Low  [facility] MS, OTR/L ascom [phone]  10/02/22,  3:14 PM

## 2022-10-02 NOTE — Telephone Encounter (Signed)
Ivin Booty from [facility] called in to let Dr Darnell Level know that she went to see this patient for the first time today and she was informed that he was admitted into [facility] today 10/02/22.

## 2022-10-02 NOTE — Evaluation (Signed)
Physical Therapy Evaluation Patient Details Name: Nicholas [name] MRN: [id number] DOB: Jun 23, 1951 Today's Date: 10/02/2022  History of Present Illness  Roran Wegner is a 64 M with past medical history that includes: CAD/MI, GERD, PFO, remote history of seizure, questionable history of stroke/CHF, L great toe amp, and B TKA. Pt presented to the ED due to inability to care for himself at home with MD diagnosis of generalized weakness.   Clinical Impression  Pt was pleasant and motivated to participate during the session and put forth good effort throughout. Pt required assist with sup to sit to manage his BLE's and trunk and with transfers and gait for stability.  Pt was able to amb 2 x 15' with step-to sequence and verbal cues for safe sequencing with the RW.  Pt has an extensive recent fall history and is at a very high risk for future falls making him unsafe to return to his prior living situation where he has minimal supervision.  Pt will benefit from PT services in a SNF setting upon discharge to safely address deficits listed in patient problem list for decreased caregiver assistance and eventual return to PLOF.          Recommendations for follow up therapy are one component of a multi-disciplinary discharge planning process, led by the attending physician.  Recommendations may be updated based on patient status, additional functional criteria and insurance authorization.  Follow Up Recommendations Skilled nursing-short term rehab (<3 hours/day) Can patient physically be transported by private vehicle: Yes    Assistance Recommended at Discharge Frequent or constant Supervision/Assistance  Patient can return home with the following  A lot of help with walking and/or transfers;A lot of help with bathing/dressing/bathroom;Assistance with cooking/housework;Assist for transportation;Help with stairs or ramp for entrance    Equipment Recommendations None recommended by PT  Recommendations  for Other Services       Functional Status Assessment Patient has had a recent decline in their functional status and demonstrates the ability to make significant improvements in function in a reasonable and predictable amount of time.     Precautions / Restrictions Precautions Precautions: Fall Restrictions Weight Bearing Restrictions: No      Mobility  Bed Mobility Overal bed mobility: Needs Assistance Bed Mobility: Sit to Supine, Supine to Sit     Supine to sit: Mod assist Sit to supine: Supervision   General bed mobility comments: Mod A for BLE and trunk control during sup to sit but pt able to go from sit to sup with SBA only    Transfers Overall transfer level: Needs assistance Equipment used: Rolling walker (2 wheels) Transfers: Sit to/from Stand Sit to Stand: Min assist           General transfer comment: Mod verbal cues for sequencing and min A for stability    Ambulation/Gait Ambulation/Gait assistance: Min assist Gait Distance (Feet): 15 Feet x 2 Assistive device: Rolling walker (2 wheels) Gait Pattern/deviations: Step-to pattern, Decreased step length - left, Decreased stance time - right Gait velocity: decreased     General Gait Details: Min A for stability with mod verbal cues for sequencing with the RW  Stairs            Wheelchair Mobility    Modified Rankin (Stroke Patients Only)       Balance Overall balance assessment: Needs assistance, History of Falls Sitting-balance support: Bilateral upper extremity supported, Feet unsupported Sitting balance-Leahy Scale: Good     Standing balance support: Bilateral upper extremity  supported, Reliant on assistive device for balance, During functional activity Standing balance-Leahy Scale: Poor                               Pertinent Vitals/Pain Pain Assessment Pain Assessment: No/denies pain    Home Living Family/patient expects to be discharged to:: Private  residence Living Arrangements: Alone Available Help at Discharge: Available PRN/intermittently;Family;Neighbor Type of Home: Independent living facility Home Access: [place]: One level Home Equipment: [place] (4 wheels);Cane - single point;Electric scooter;Grab bars - toilet;Grab bars - tub/shower;Wheelchair - [employer] copy (2 wheels) Additional Comments:     Prior Function Prior Level of Function : History of Falls (last six months)             Mobility Comments: Mod Ind amb with a rollator limited community distances, > 10 falls in the last 6 months primarily from tripping ADLs Comments: Ind with ADLs but stated bathing and dressing have become very difficult, friend assists with meal prep, groceries and meds delivered     Hand Dominance   Dominant Hand: Right    Extremity/Trunk Assessment   Upper Extremity Assessment Upper Extremity Assessment: Generalized weakness    Lower Extremity Assessment Lower Extremity Assessment: Generalized weakness;RLE deficits/detail RLE Deficits / Details: Pt with chronic R knee extension contracture the pt stated occured gradually after his R knee replacement Sx around 12 years ago; pt reported has consulted with orthopedic MDs regarding the issue but was told there was no options available secondary to him not being a good candidate for surgery       Communication   Communication: No difficulties  Cognition Arousal/Alertness: Awake/alert Behavior During Therapy: WFL for tasks assessed/performed, Flat affect Overall Cognitive Status: Within Functional Limits for tasks assessed                                          General Comments      Exercises Other Exercises Other Exercises: Extensive education provided on benefits of RW over rollator with pt's balance impairments and history of falling   Assessment/Plan    PT Assessment Patient needs continued PT services  PT Problem  List Decreased strength;Decreased activity tolerance;Decreased balance;Decreased mobility;Decreased knowledge of use of DME       PT Treatment Interventions DME instruction;Gait training;Functional mobility training;Therapeutic activities;Therapeutic exercise;Balance training;Patient/family education    PT Goals (Current goals can be found in the Care Plan section)  Acute Rehab PT Goals Patient Stated Goal: To walk better without falling PT Goal Formulation: With patient Time For Goal Achievement: 10/15/22 Potential to Achieve Goals: Good    Frequency Min 2X/week     Co-evaluation               AM-PAC PT "6 Clicks" Mobility  Outcome Measure Help needed turning from your back to your side while in a flat bed without using bedrails?: A Little Help needed moving from lying on your back to sitting on the side of a flat bed without using bedrails?: A Lot Help needed moving to and from a bed to a chair (including a wheelchair)?: A Lot Help needed standing up from a chair using your arms (e.g., wheelchair or bedside chair)?: A Little Help needed to walk in hospital room?: A Little Help needed climbing 3-5 steps with a  railing? : A Lot 6 Click Score: 15    End of Session Equipment Utilized During Treatment: Gait belt Activity Tolerance: Patient tolerated treatment well Patient left: in bed;Other (comment) (bed rails up as pt found in ED in hallway) Nurse Communication: Mobility status PT Visit Diagnosis: Unsteadiness on feet (R26.81);History of falling (Z91.81);Muscle weakness (generalized) (M62.81);Difficulty in walking, not elsewhere classified (R26.2)    Time: 8850-2774 PT Time Calculation (min) (ACUTE ONLY): 27 min   Charges:   PT Evaluation $PT Eval Moderate Complexity: 1 Mod PT Treatments $Gait Training: 8-22 mins       D. Royetta Asal PT, DPT 10/02/22, 3:54 PM

## 2022-10-02 NOTE — TOC Initial Note (Signed)
Transition of Care [facility]) - Initial/Assessment Note    Patient Details  Name: Nicholas [name] MRN: [id number] Date of Birth: [birth year]  Transition of Care [facility]) CM/SW Contact:    Shelbie Hutching, RN Phone Number: 10/02/2022, 9:59 AM  Clinical Narrative:                 Patient being seen in the emergency department for not being able to care for himself at home.  RNCM met with patient at the bedside in the emergency department.  He was at [facility] in November for short term rehab, he would like to go back and try short term rehab again.  He has been falling at home.  He lives alone, he has a neighbor that has been getting him groceries, he has a son.  Patient reports that he would like to go back to [facility] if he can.  PT and OT evaluations have been ordered, RNCM will start SNF workup.   Expected Discharge Plan: Skilled Nursing Facility Barriers to Discharge: SNF Pending bed offer   Patient Goals and CMS Choice Patient states their goals for this hospitalization and ongoing recovery are:: patient wants to go for rehab CMS Medicare.gov Compare Post Acute Care list provided to:: Patient Choice offered to / list presented to : Patient  Expected Discharge Plan and Services Expected Discharge Plan: [place]   Discharge Planning Services: CM Consult Post Acute Care Choice: [place] Living arrangements for the past 2 months: Single Family Home                 DME Arranged: N/A DME Agency: NA       HH Arranged: NA HH Agency: NA        Prior Living Arrangements/Services Living arrangements for the past 2 months: Single Family Home Lives with:: Self Patient language and need for interpreter reviewed:: Yes Do you feel safe going back to the place where you live?: No   keeps falling at home  Need for Family Participation in Patient Care: Yes (Comment) Care giver support system in place?: Yes (comment) Current home  services: DME Criminal Activity/Legal Involvement Pertinent to Current Situation/Hospitalization: No - Comment as needed  Activities of Daily Living      Permission Sought/Granted Permission sought to share information with : Facility Sport and exercise psychologist, Family Supports Permission granted to share information with : Yes, Verbal Permission Granted  Share Information with NAME: Ralphie Lovelady  Permission granted to share info w AGENCY: [employer] granted to share info w Relationship: son  Permission granted to share info w Contact Information: [phone]  Emotional Assessment Appearance:: Appears stated age Attitude/Demeanor/Rapport: Engaged Affect (typically observed): Accepting Orientation: : Oriented to Self, Oriented to Place, Oriented to  Time, Oriented to Situation Alcohol / Substance Use: Not Applicable Psych Involvement: No (comment)  Admission diagnosis:  Placement/Weakness Patient Active Problem List   Diagnosis Date Noted   UTI (urinary tract infection) 09/27/2022   Petechial rash 09/27/2022   SOB (shortness of breath) 08/24/2022   Sepsis ([place]) 08/23/2022   MSSA bacteremia 08/16/2022 08/18/2022   Seizure disorder ([place]) 08/17/2022   Atrial fibrillation with RVR ([place]) 08/17/2022   Fall 08/17/2022   Weakness 08/16/2022   Acute urinary retention 08/13/2022   History of seizure 65/68/1275   Acute metabolic encephalopathy 17/00/1749   PND (post-nasal drip) 07/25/2022   Oral candida 07/15/2022   Open wound of foot with complication, left, initial encounter 06/16/2022  Hx of excision of lamina of lumbar vertebra for decompression of spinal cord 03/20/2022   Peripheral edema 02/28/2022   Recurrent major depressive disorder ([place]) 02/28/2022   Chronic headache 02/28/2022   Recurrent falls 08/25/2021   Memory difficulty 07/04/2021   Chronic toe ulcer, left, with fat layer exposed ([place]) 01/23/2021   Pituitary adenoma ([place]) 09/06/2020   Chronic  intractable headache 07/27/2020   Vitamin B12 deficiency 03/31/2020   Vitamin D deficiency 03/31/2020   Numbness of foot 03/25/2020   Status post amputation of great toe, left ([place]) 12/14/2019   Callus of foot 04/02/2019   Allergy to bee sting 03/11/2019   Insomnia 02/10/2019   Status post lumbar spine operative procedure for decompression of spinal cord 12/12/2018   Dyslipidemia 10/19/2018   Foot deformity 10/19/2018   Status post reverse total replacement of right shoulder 05/13/2018   Chronic diastolic CHF (congestive heart failure) ([place]) 12/27/2017   OSA (obstructive sleep apnea) 12/27/2017   CKD (chronic kidney disease) stage 3, GFR 30-59 ml/min (HCC) 10/01/2017   Fatty liver 10/01/2017   Status post reverse total replacement of left shoulder 09/03/2017   Bilateral shoulder pain 05/24/2017   History of CVA in adulthood 03/08/2017   S/P total knee arthroplasty, left 01/30/2017   Osteoarthritis of knees, bilateral 01/15/2017   Obesity, morbid, BMI 40.0-49.9 ([place]) 07/19/2016   Hypothyroidism    Iron deficiency 04/23/2016   Chronic pain syndrome 04/23/2016   Inflammatory arthritis 04/23/2016   Chronic leg pain 04/23/2016   Lesion of palate 04/23/2016   Grieving 04/23/2016   GERD (gastroesophageal reflux disease)    Duodenitis determined by biopsy 02/13/2016   Diverticulosis of colon without hemorrhage 01/31/2016   Migraine 01/11/2012   History of methicillin resistant staphylococcus aureus (MRSA) 07/04/2010   DDD (degenerative disc disease), lumbar 07/04/2010   History of total right knee replacement 07/04/2010   Contact dermatitis 07/04/2010   Lesion of ulnar nerve 07/04/2010   Closed fracture of malar bone ([place]) 07/04/2010   Methicillin susceptible Staphylococcus aureus infection 07/04/2010   Narrowing of intervertebral disc space 07/04/2010   Olecranon bursitis 07/04/2010   History of artificial joint 07/04/2010   Primary osteoarthritis, right shoulder 06/09/2007    Osteoarthritis 06/09/2007   Personal history of colonic adenoma 06/01/2003   PCP:  [name], MD Pharmacy:   [facility] - [place], [place] - [address] [place] [address] Phone: [phone] Fax: [phone]     Social Determinants of Health (SDOH) Interventions    Readmission Risk Interventions     No data to display

## 2022-10-02 NOTE — ED Notes (Signed)
Pt's daughter, Sharyn Lull, updated that pt had seen OT and will see PT later today. Daughter said he is currently receiving home health.

## 2022-10-02 NOTE — ED Notes (Addendum)
Spoke with Pt's daughter Sharyn Lull and updated her on pt's condition. Also spoke with pt's son earlier while the pt had him on the phone.   Sharyn Lull [phone]

## 2022-10-02 NOTE — ED Notes (Signed)
This RN put in a request for a hospital bed to be brought to the ER for the pt.

## 2022-10-02 NOTE — NC FL2 (Signed)
[place] LEVEL OF CARE FORM     IDENTIFICATION  Patient Name: Nicholas [name] Birthdate: 17-Aug-1951 Sex: male Admission Date (Current Location): 10/01/2022  [facility] [place] Number:  Engineering geologist and Address:  [facility], [address] [place], [place] [address]      Provider Number: [id number]  Attending Physician Name and Address:  No att. providers found  Relative Name and Phone Number:  Jadarius Commons- son- [phone]    Current Level of Care: Hospital Recommended Level of Care: [place] Prior Approval Number:    Date Approved/Denied:   PASRR Number: [id number]  Discharge Plan: SNF    Current Diagnoses: Patient Active Problem List   Diagnosis Date Noted   UTI (urinary tract infection) 09/27/2022   Petechial rash 09/27/2022   SOB (shortness of breath) 08/24/2022   Sepsis ([place]) 08/23/2022   MSSA bacteremia 08/16/2022 08/18/2022   Seizure disorder ([place]) 08/17/2022   Atrial fibrillation with RVR ([place]) 08/17/2022   Fall 08/17/2022   Weakness 08/16/2022   Acute urinary retention 08/13/2022   History of seizure 00/86/7619   Acute metabolic encephalopathy 50/93/2671   PND (post-nasal drip) 07/25/2022   Oral candida 07/15/2022   Open wound of foot with complication, left, initial encounter 06/16/2022   Hx of excision of lamina of lumbar vertebra for decompression of spinal cord 03/20/2022   Peripheral edema 02/28/2022   Recurrent major depressive disorder ([place]) 02/28/2022   Chronic headache 02/28/2022   Recurrent falls 08/25/2021   Memory difficulty 07/04/2021   Chronic toe ulcer, left, with fat layer exposed ([place]) 01/23/2021   Pituitary adenoma ([place]) 09/06/2020   Chronic intractable headache 07/27/2020   Vitamin B12 deficiency 03/31/2020   Vitamin D deficiency 03/31/2020   Numbness of foot 03/25/2020   Status post amputation of great toe, left ([place]) 12/14/2019   Callus of foot 04/02/2019    Allergy to bee sting 03/11/2019   Insomnia 02/10/2019   Status post lumbar spine operative procedure for decompression of spinal cord 12/12/2018   Dyslipidemia 10/19/2018   Foot deformity 10/19/2018   Status post reverse total replacement of right shoulder 05/13/2018   Chronic diastolic CHF (congestive heart failure) ([place]) 12/27/2017   OSA (obstructive sleep apnea) 12/27/2017   CKD (chronic kidney disease) stage 3, GFR 30-59 ml/min ([place]) 10/01/2017   Fatty liver 10/01/2017   Status post reverse total replacement of left shoulder 09/03/2017   Bilateral shoulder pain 05/24/2017   History of CVA in adulthood 03/08/2017   S/P total knee arthroplasty, left 01/30/2017   Osteoarthritis of knees, bilateral 01/15/2017   Obesity, morbid, BMI 40.0-49.9 ([place]) 07/19/2016   Hypothyroidism    Iron deficiency 04/23/2016   Chronic pain syndrome 04/23/2016   Inflammatory arthritis 04/23/2016   Chronic leg pain 04/23/2016   Lesion of palate 04/23/2016   Grieving 04/23/2016   GERD (gastroesophageal reflux disease)    Duodenitis determined by biopsy 02/13/2016   Diverticulosis of colon without hemorrhage 01/31/2016   Migraine 01/11/2012   History of methicillin resistant staphylococcus aureus (MRSA) 07/04/2010   DDD (degenerative disc disease), lumbar 07/04/2010   History of total right knee replacement 07/04/2010   Contact dermatitis 07/04/2010   Lesion of ulnar nerve 07/04/2010   Closed fracture of malar bone ([place]) 07/04/2010   Methicillin susceptible Staphylococcus aureus infection 07/04/2010   Narrowing of intervertebral disc space 07/04/2010   Olecranon bursitis 07/04/2010   History of artificial joint 07/04/2010   Primary osteoarthritis, right shoulder 06/09/2007  Osteoarthritis 06/09/2007   Personal history of colonic adenoma 06/01/2003    Orientation RESPIRATION BLADDER Height & Weight     Self, Time, Situation, Place  Normal Continent Weight: 99.8 kg Height:  '5\' 8"'$  (172.7 cm)   BEHAVIORAL SYMPTOMS/MOOD NEUROLOGICAL BOWEL NUTRITION STATUS      Continent Diet (Regular)  AMBULATORY STATUS COMMUNICATION OF NEEDS Skin   Limited Assist Verbally Normal                       Personal Care Assistance Level of Assistance  Bathing, Feeding, Dressing Bathing Assistance: Limited assistance Feeding assistance: Independent Dressing Assistance: Limited assistance     Functional Limitations Info  Sight, Hearing, Speech Sight Info: Impaired (glasses) Hearing Info: Adequate Speech Info: Adequate    SPECIAL CARE FACTORS FREQUENCY  PT (By licensed PT), OT (By licensed OT)     PT Frequency: 5 times per week OT Frequency: min 3 times per week            Contractures Contractures Info: Not present    Additional Factors Info  Code Status, Allergies Code Status Info: Full Allergies Info: Peanut containing Drug products, yellow jacket venom, bee enom, clebrex, percocet, latex, NSAidS           Current Medications (10/02/2022):  This is the current hospital active medication list Current Facility-Administered Medications  Medication Dose Route Frequency Provider Last Rate Last Admin   acetaminophen (TYLENOL) tablet 1,000 mg  1,000 mg Oral Q8H PRN Vanessa Wilber, MD   1,000 mg at 10/02/22 0801   amitriptyline (ELAVIL) tablet 100 mg  100 mg Oral QHS Vanessa [place], MD   100 mg at 10/02/22 0248   apixaban (ELIQUIS) tablet 5 mg  5 mg Oral BID Vanessa [place], MD   5 mg at 10/02/22 0248   atorvastatin (LIPITOR) tablet 20 mg  20 mg Oral Daily Vanessa [place], MD       divalproex (DEPAKOTE ER) 24 hr tablet 500 mg  500 mg Oral QHS Vanessa [place], MD   500 mg at 10/02/22 0248   ferrous sulfate tablet 325 mg  325 mg Oral Q breakfast Vanessa [place], MD   325 mg at 10/02/22 0802   furosemide (LASIX) tablet 20 mg  20 mg Oral q morning Vanessa [place], MD       gabapentin (NEURONTIN) capsule 300 mg  300 mg Oral q AM Vanessa [place], MD   300 mg at 10/02/22 0646   gabapentin  (NEURONTIN) capsule 600 mg  600 mg Oral Q1200 Vanessa [place], MD       gabapentin (NEURONTIN) capsule 600 mg  600 mg Oral QHS Vanessa [place], MD       levothyroxine (SYNTHROID) tablet 112 mcg  112 mcg Oral Q0600 Vanessa [place], MD   112 mcg at 10/02/22 0556   loratadine (CLARITIN) tablet 10 mg  10 mg Oral Daily Vanessa [place], MD       metFORMIN (GLUCOPHAGE) tablet 500 mg  500 mg Oral Daily Vanessa [place], MD       metoprolol succinate (TOPROL-XL) 24 hr tablet 100 mg  100 mg Oral QHS Vanessa , MD   100 mg at 10/02/22 0248   nystatin (MYCOSTATIN) 100000 UNIT/ML suspension 500,000 Units  5 mL Oral QID Vanessa , MD       ondansetron (ZOFRAN-ODT) disintegrating tablet 4 mg  4 mg Oral Q8H PRN Vanessa , MD  pantoprazole (PROTONIX) EC tablet 40 mg  40 mg Oral Lubertha Basque, MD       tamsulosin [facility]) capsule 0.4 mg  0.4 mg Oral Daily Vanessa [place], MD       Current Outpatient Medications  Medication Sig Dispense Refill   acetaminophen (TYLENOL) 500 MG tablet Take 1,000 mg by mouth 3 (three) times daily.     amitriptyline (ELAVIL) 50 MG tablet TAKE TWO TABLETS BY MOUTH EVERYDAY AT BEDTIME (Patient taking differently: Take 100 mg by mouth at bedtime.) 180 tablet 2   apixaban (ELIQUIS) 5 MG TABS tablet Take 1 tablet (5 mg total) by mouth 2 (two) times daily. 60 tablet 3   atorvastatin (LIPITOR) 20 MG tablet TAKE ONE TABLET BY MOUTH ONCE DAILY 90 tablet 3   B Complex Vitamins (B-COMPLEX/B-12 PO) Take 1 tablet by mouth daily.     cetirizine (ZYRTEC) 10 MG tablet Take 1 tablet (10 mg total) by mouth daily. 30 tablet 0   Cholecalciferol (VITAMIN D3) 25 MCG (1000 UT) CAPS Take 1 capsule (1,000 Units total) by mouth daily. 90 capsule 1   ciprofloxacin (CIPRO) 500 MG tablet SMARTSIG:1 Tablet(s) By Mouth Every 12 Hours     clotrimazole (LOTRIMIN) 1 % cream Apply 1 Application topically 2 (two) times daily as needed (rash).     divalproex (DEPAKOTE ER) 500 MG 24 hr tablet TAKE ONE TABLET BY  MOUTH EVERYDAY AT BEDTIME 30 tablet 5   ferrous sulfate 325 (65 FE) MG EC tablet Take 325 mg by mouth daily with breakfast.     fluticasone (FLONASE) 50 MCG/ACT nasal spray Place 2 sprays into both nostrils daily. 16 g 0   Fremanezumab-vfrm (AJOVY) 225 MG/1.5ML SOAJ Inject 225 mg into the skin every 30 (thirty) days. 1.68 mL 11   furosemide (LASIX) 20 MG tablet TAKE ONE TABLET BY MOUTH EVERY MORNING 90 tablet 1   gabapentin (NEURONTIN) 300 MG capsule TAKE ONE CAPSULE BY MOUTH EVERY MORNING and TAKE TWO CAPSULES BY MOUTH AT NOON and TAKE TWO CAPSULES BY MOUTH EVERY EVENING 450 capsule 1   levothyroxine (SYNTHROID) 112 MCG tablet Take 1 tablet (112 mcg total) by mouth daily before breakfast. 90 tablet 3   metFORMIN (GLUCOPHAGE) 500 MG tablet Take 500 mg by mouth daily.     methocarbamol (ROBAXIN) 500 MG tablet TAKE ONE TABLET BY MOUTH twice daily AS NEEDED FOR muscle SPASMS 20 tablet 2   metoprolol succinate (TOPROL-XL) 100 MG 24 hr tablet Take 1 tablet (100 mg total) by mouth at bedtime. Take with or immediately following a meal.     nystatin (MYCOSTATIN) 100000 UNIT/ML suspension Take 5 mLs (500,000 Units total) by mouth 4 (four) times daily. 200 mL 0   pantoprazole (PROTONIX) 40 MG tablet Take 40 mg by mouth every other day.     tamsulosin (FLOMAX) 0.4 MG CAPS capsule Take 1 capsule (0.4 mg total) by mouth daily. 30 capsule 0   diphenhydrAMINE (BENADRYL) 25 mg capsule Take 25 mg by mouth every 6 (six) hours as needed for allergies.     EPINEPHrine 0.3 mg/0.3 mL IJ SOAJ injection Inject 0.3 mg into the muscle as needed for anaphylaxis. (bee stings) (Patient not taking: Reported on 10/02/2022) 2 each 1   HYDROcodone-acetaminophen (NORCO/VICODIN) 5-325 MG tablet Take 1 tablet by mouth every 6 (six) hours as needed for moderate pain.     nitroGLYCERIN (NITROSTAT) 0.4 MG SL tablet Place 1 tablet (0.4 mg total) under the tongue every 5 (five) minutes as  needed for chest pain. (Patient not taking: Reported  on 10/02/2022) 20 tablet 3   ondansetron (ZOFRAN-ODT) 4 MG disintegrating tablet TAKE ONE TABLET BY MOUTH EVERY 8 HOURS AS NEEDED 20 tablet 3   tiZANidine (ZANAFLEX) 4 MG tablet TAKE ONE TABLET BY MOUTH every SIX hours AS NEEDED FOR acute HEADACHE 20 tablet 1   Ubrogepant (UBRELVY) 100 MG TABS Take 100 mg by mouth as needed (take 1 tablet at onset of headache, may repeat in hours, max is 200 mg in 24 hours). (Patient taking differently: Take 100 mg by mouth as needed (Give 1 tablet by mouth every 12 hours as needed for headache).) 12 tablet 11     Discharge Medications: Please see discharge summary for a list of discharge medications.  Relevant Imaging Results:  Relevant Lab Results:   Additional Information SSN: [id number]  Shelbie Hutching, RN

## 2022-10-02 NOTE — Telephone Encounter (Signed)
Noted! Thank you

## 2022-10-02 NOTE — ED Notes (Signed)
Pt was switched into a hospital bed in the hallway for comfort.

## 2022-10-02 NOTE — ED Notes (Signed)
Pt's dinner tray was cleaned up and bedside area cleaned up.

## 2022-10-02 NOTE — ED Provider Notes (Signed)
CT head was negative and thyroid it was negative.  Therefore patient will become a border   Vanessa [place], MD 10/02/22 [phone]

## 2022-10-03 ENCOUNTER — Ambulatory Visit: Payer: Medicare Other | Admitting: Internal Medicine

## 2022-10-03 NOTE — ED Notes (Signed)
Pt given bed bath. Linens changed and new gown changed. Pt resting in bed.

## 2022-10-03 NOTE — ED Notes (Signed)
Pt's daughter called, this RN assured pt has his phone at hand but told her pt still appears to be sleeping at this time.

## 2022-10-03 NOTE — TOC Progression Note (Signed)
Transition of Care [facility]) - Progression Note    Patient Details  Name: Nicholas [name] MRN: [id number] Date of Birth: 03-08-1951  [facility] CM/SW Contact  Shelbie Hutching, RN Phone Number: 10/03/2022, 12:16 PM  Clinical Narrative:    [facility] declined to offer a bed.  RNCM reached out to the patient's son via phone, presented 3 bed offers and he chooses [place].  Insurance auth started on the [place] portal for [employer].       Barriers to Discharge: SNF Pending bed offer  Expected Discharge Plan and Services   Discharge Planning Services: CM Consult Post Acute Care Choice: [place] Living arrangements for the past 2 months: Single Family Home                 DME Arranged: N/A DME Agency: NA       HH Arranged: NA HH Agency: NA         Social Determinants of Health (SDOH) Interventions SDOH Screenings   Food Insecurity: No Food Insecurity (10/01/2022)  Housing: Low Risk  (10/01/2022)  Transportation Needs: No Transportation Needs (10/01/2022)  Utilities: Not At Risk (08/27/2022)  Alcohol Screen: Low Risk  (02/28/2022)  Depression (PHQ2-9): Low Risk  (09/10/2022)  Financial Resource Strain: Medium Risk (02/28/2022)  Physical Activity: Insufficiently Active (02/28/2022)  Social Connections: Moderately Isolated (02/28/2022)  Stress: No Stress Concern Present (02/28/2022)  Tobacco Use: Low Risk  (10/01/2022)    Readmission Risk Interventions     No data to display

## 2022-10-03 NOTE — ED Notes (Signed)
Pt woke up and was told about conversations with daughter. This RN assisted pt in speaking with his daughter

## 2022-10-03 NOTE — ED Notes (Signed)
Handoff by Lorriane Shire, RN to [facility] RN for pt care now.

## 2022-10-03 NOTE — ED Provider Notes (Signed)
-----------------------------------------   9:14 AM on 10/03/2022 -----------------------------------------   Blood pressure 105/74, pulse 90, temperature 98.6 F (37 C), temperature source Oral, resp. rate 18, height '5\' 8"'$  (1.727 m), weight 99.8 kg, SpO2 96 %.  The patient is calm and cooperative at this time.  There have been no acute events since the last update.  Awaiting disposition plan from case management/social work.    Nathaniel Man, MD 10/03/22 [phone]

## 2022-10-03 NOTE — Progress Notes (Signed)
PT Cancellation Note  Patient Details Name: Nicholas [name] MRN: [id number] DOB: 03-Mar-1951   Cancelled Treatment:     Pt refused any activity. Continue PT per POC next available date/time.   Josie Dixon 10/03/2022, 3:26 PM

## 2022-10-03 NOTE — ED Notes (Signed)
Care transferred to [facility]

## 2022-10-03 NOTE — ED Notes (Signed)
Patient resting in bed free from sign of distress. Breathing unlabored  with symmetric chest rise and fall. Bed low and locked with side rails raised x2. Call bell in reach and monitor in place.   

## 2022-10-04 ENCOUNTER — Ambulatory Visit: Payer: Medicare Other | Admitting: Orthopedic Surgery

## 2022-10-04 DIAGNOSIS — K5901 Slow transit constipation: Secondary | ICD-10-CM | POA: Diagnosis not present

## 2022-10-04 DIAGNOSIS — I4891 Unspecified atrial fibrillation: Secondary | ICD-10-CM | POA: Diagnosis not present

## 2022-10-04 DIAGNOSIS — Z789 Other specified health status: Secondary | ICD-10-CM | POA: Diagnosis not present

## 2022-10-04 DIAGNOSIS — E785 Hyperlipidemia, unspecified: Secondary | ICD-10-CM | POA: Diagnosis not present

## 2022-10-04 DIAGNOSIS — I5032 Chronic diastolic (congestive) heart failure: Secondary | ICD-10-CM | POA: Diagnosis not present

## 2022-10-04 DIAGNOSIS — M6281 Muscle weakness (generalized): Secondary | ICD-10-CM | POA: Diagnosis not present

## 2022-10-04 DIAGNOSIS — E039 Hypothyroidism, unspecified: Secondary | ICD-10-CM | POA: Diagnosis not present

## 2022-10-04 DIAGNOSIS — M542 Cervicalgia: Secondary | ICD-10-CM | POA: Diagnosis not present

## 2022-10-04 DIAGNOSIS — N390 Urinary tract infection, site not specified: Secondary | ICD-10-CM | POA: Diagnosis not present

## 2022-10-04 DIAGNOSIS — Z79899 Other long term (current) drug therapy: Secondary | ICD-10-CM | POA: Diagnosis not present

## 2022-10-04 DIAGNOSIS — Z931 Gastrostomy status: Secondary | ICD-10-CM | POA: Diagnosis not present

## 2022-10-04 DIAGNOSIS — G43909 Migraine, unspecified, not intractable, without status migrainosus: Secondary | ICD-10-CM | POA: Diagnosis not present

## 2022-10-04 DIAGNOSIS — Z7901 Long term (current) use of anticoagulants: Secondary | ICD-10-CM | POA: Diagnosis not present

## 2022-10-04 DIAGNOSIS — R7881 Bacteremia: Secondary | ICD-10-CM | POA: Diagnosis not present

## 2022-10-04 DIAGNOSIS — Z89422 Acquired absence of other left toe(s): Secondary | ICD-10-CM | POA: Diagnosis not present

## 2022-10-04 DIAGNOSIS — M064 Inflammatory polyarthropathy: Secondary | ICD-10-CM | POA: Diagnosis not present

## 2022-10-04 DIAGNOSIS — Z89429 Acquired absence of other toe(s), unspecified side: Secondary | ICD-10-CM | POA: Diagnosis not present

## 2022-10-04 DIAGNOSIS — N1831 Chronic kidney disease, stage 3a: Secondary | ICD-10-CM | POA: Diagnosis not present

## 2022-10-04 DIAGNOSIS — R9431 Abnormal electrocardiogram [ECG] [EKG]: Secondary | ICD-10-CM | POA: Diagnosis not present

## 2022-10-04 DIAGNOSIS — Z96653 Presence of artificial knee joint, bilateral: Secondary | ICD-10-CM | POA: Diagnosis not present

## 2022-10-04 DIAGNOSIS — Z91010 Allergy to peanuts: Secondary | ICD-10-CM | POA: Diagnosis not present

## 2022-10-04 DIAGNOSIS — R001 Bradycardia, unspecified: Secondary | ICD-10-CM | POA: Diagnosis not present

## 2022-10-04 DIAGNOSIS — Z8619 Personal history of other infectious and parasitic diseases: Secondary | ICD-10-CM | POA: Diagnosis not present

## 2022-10-04 DIAGNOSIS — I509 Heart failure, unspecified: Secondary | ICD-10-CM | POA: Diagnosis not present

## 2022-10-04 DIAGNOSIS — G4489 Other headache syndrome: Secondary | ICD-10-CM | POA: Diagnosis not present

## 2022-10-04 DIAGNOSIS — I499 Cardiac arrhythmia, unspecified: Secondary | ICD-10-CM | POA: Diagnosis not present

## 2022-10-04 DIAGNOSIS — R930 Abnormal findings on diagnostic imaging of skull and head, not elsewhere classified: Secondary | ICD-10-CM | POA: Diagnosis not present

## 2022-10-04 DIAGNOSIS — Z7984 Long term (current) use of oral hypoglycemic drugs: Secondary | ICD-10-CM | POA: Diagnosis not present

## 2022-10-04 DIAGNOSIS — Z09 Encounter for follow-up examination after completed treatment for conditions other than malignant neoplasm: Secondary | ICD-10-CM | POA: Diagnosis not present

## 2022-10-04 DIAGNOSIS — Z20822 Contact with and (suspected) exposure to covid-19: Secondary | ICD-10-CM | POA: Diagnosis not present

## 2022-10-04 DIAGNOSIS — N183 Chronic kidney disease, stage 3 unspecified: Secondary | ICD-10-CM | POA: Diagnosis not present

## 2022-10-04 DIAGNOSIS — Z91040 Latex allergy status: Secondary | ICD-10-CM | POA: Diagnosis not present

## 2022-10-04 DIAGNOSIS — Z8616 Personal history of COVID-19: Secondary | ICD-10-CM | POA: Diagnosis not present

## 2022-10-04 DIAGNOSIS — N189 Chronic kidney disease, unspecified: Secondary | ICD-10-CM | POA: Diagnosis not present

## 2022-10-04 DIAGNOSIS — F32A Depression, unspecified: Secondary | ICD-10-CM | POA: Diagnosis not present

## 2022-10-04 DIAGNOSIS — Z4781 Encounter for orthopedic aftercare following surgical amputation: Secondary | ICD-10-CM | POA: Diagnosis not present

## 2022-10-04 DIAGNOSIS — R2689 Other abnormalities of gait and mobility: Secondary | ICD-10-CM | POA: Diagnosis not present

## 2022-10-04 DIAGNOSIS — Z8709 Personal history of other diseases of the respiratory system: Secondary | ICD-10-CM | POA: Diagnosis not present

## 2022-10-04 DIAGNOSIS — S0990XA Unspecified injury of head, initial encounter: Secondary | ICD-10-CM | POA: Diagnosis not present

## 2022-10-04 DIAGNOSIS — K219 Gastro-esophageal reflux disease without esophagitis: Secondary | ICD-10-CM | POA: Diagnosis not present

## 2022-10-04 DIAGNOSIS — G8929 Other chronic pain: Secondary | ICD-10-CM | POA: Diagnosis not present

## 2022-10-04 DIAGNOSIS — R4182 Altered mental status, unspecified: Secondary | ICD-10-CM | POA: Diagnosis present

## 2022-10-04 DIAGNOSIS — Z8679 Personal history of other diseases of the circulatory system: Secondary | ICD-10-CM | POA: Diagnosis not present

## 2022-10-04 DIAGNOSIS — M5136 Other intervertebral disc degeneration, lumbar region: Secondary | ICD-10-CM | POA: Diagnosis not present

## 2022-10-04 DIAGNOSIS — R296 Repeated falls: Secondary | ICD-10-CM | POA: Diagnosis not present

## 2022-10-04 DIAGNOSIS — M549 Dorsalgia, unspecified: Secondary | ICD-10-CM | POA: Diagnosis not present

## 2022-10-04 DIAGNOSIS — L97523 Non-pressure chronic ulcer of other part of left foot with necrosis of muscle: Secondary | ICD-10-CM | POA: Diagnosis not present

## 2022-10-04 DIAGNOSIS — R410 Disorientation, unspecified: Secondary | ICD-10-CM | POA: Diagnosis not present

## 2022-10-04 DIAGNOSIS — M792 Neuralgia and neuritis, unspecified: Secondary | ICD-10-CM | POA: Diagnosis not present

## 2022-10-04 DIAGNOSIS — I739 Peripheral vascular disease, unspecified: Secondary | ICD-10-CM | POA: Diagnosis not present

## 2022-10-04 DIAGNOSIS — R262 Difficulty in walking, not elsewhere classified: Secondary | ICD-10-CM | POA: Diagnosis not present

## 2022-10-04 DIAGNOSIS — R0682 Tachypnea, not elsewhere classified: Secondary | ICD-10-CM | POA: Diagnosis present

## 2022-10-04 DIAGNOSIS — J45909 Unspecified asthma, uncomplicated: Secondary | ICD-10-CM | POA: Diagnosis not present

## 2022-10-04 DIAGNOSIS — B9561 Methicillin susceptible Staphylococcus aureus infection as the cause of diseases classified elsewhere: Secondary | ICD-10-CM | POA: Diagnosis not present

## 2022-10-04 DIAGNOSIS — R6889 Other general symptoms and signs: Secondary | ICD-10-CM | POA: Diagnosis not present

## 2022-10-04 DIAGNOSIS — I5023 Acute on chronic systolic (congestive) heart failure: Secondary | ICD-10-CM | POA: Diagnosis not present

## 2022-10-04 DIAGNOSIS — R079 Chest pain, unspecified: Secondary | ICD-10-CM | POA: Diagnosis not present

## 2022-10-04 DIAGNOSIS — Z743 Need for continuous supervision: Secondary | ICD-10-CM | POA: Diagnosis not present

## 2022-10-04 DIAGNOSIS — R059 Cough, unspecified: Secondary | ICD-10-CM | POA: Diagnosis not present

## 2022-10-04 DIAGNOSIS — Z7401 Bed confinement status: Secondary | ICD-10-CM | POA: Diagnosis not present

## 2022-10-04 DIAGNOSIS — R531 Weakness: Secondary | ICD-10-CM | POA: Diagnosis not present

## 2022-10-04 DIAGNOSIS — Z1152 Encounter for screening for COVID-19: Secondary | ICD-10-CM | POA: Diagnosis not present

## 2022-10-04 DIAGNOSIS — M19011 Primary osteoarthritis, right shoulder: Secondary | ICD-10-CM | POA: Diagnosis not present

## 2022-10-04 DIAGNOSIS — I2510 Atherosclerotic heart disease of native coronary artery without angina pectoris: Secondary | ICD-10-CM | POA: Diagnosis not present

## 2022-10-04 DIAGNOSIS — J101 Influenza due to other identified influenza virus with other respiratory manifestations: Secondary | ICD-10-CM | POA: Diagnosis not present

## 2022-10-04 DIAGNOSIS — I4819 Other persistent atrial fibrillation: Secondary | ICD-10-CM | POA: Diagnosis not present

## 2022-10-04 NOTE — TOC Transition Note (Signed)
Transition of Care [facility]) - CM/SW Discharge Note   Patient Details  Name: Nicholas [name] MRN: [id number] Date of Birth: [birth year]  [facility]) CM/SW Contact:  Shelbie Hutching, RN Phone Number: 10/04/2022, 11:15 AM   Clinical Narrative:    Patient will discharge to [facility] today.  Insurance authorization has been approved.  Insurance auth # [id number], Reference [id number] approved 12/21-12/26.  Patient will be going to room 403, ED RN will call report to [phone].  Patient's son or a friend will provide transportation to the facility.     Final next level of care: Skilled Nursing Facility Barriers to Discharge: Barriers Resolved   Patient Goals and CMS Choice Patient states their goals for this hospitalization and ongoing recovery are:: patient wants to go for rehab CMS Medicare.gov Compare Post Acute Care list provided to:: Patient Choice offered to / list presented to : Patient    Discharge Placement              Patient chooses bed at: [facility] Patient to be transferred to facility by: Family or friend Name of family member notified: [name] - son Patient and family notified of of transfer: 10/04/22  Discharge Plan and Services   Discharge Planning Services: CM Consult Post Acute Care Choice: [place]          DME Arranged: N/A DME Agency: NA       HH Arranged: NA HH Agency: NA        Social Determinants of Health (SDOH) Interventions     Readmission Risk Interventions     No data to display

## 2022-10-04 NOTE — ED Notes (Signed)
Pt provided with complete linen change, disposable brief change, and pericare.  Pt groin skin red and per pt sensitive.  Barrier cream applied.

## 2022-10-04 NOTE — ED Notes (Signed)
Attempted to call report to [facility], no answer, no voicemail.

## 2022-10-04 NOTE — TOC Progression Note (Signed)
Transition of Care [facility]) - Progression Note    Patient Details  Name: Nicholas [name] MRN: [id number] Date of Birth: [birth year]  Transition [facility] Care [facility]) CM/SW Contact  Shelbie Hutching, RN Phone Number: 10/04/2022, 2:07 PM  Clinical Narrative:    Patient requested EMS transport, ED secretary will arrange EMS transport to [place].      Barriers to Discharge: Barriers Resolved  Expected Discharge Plan and Services   Discharge Planning Services: CM Consult Post Acute Care Choice: [place] Living arrangements for the past 2 months: Single Family Home                 DME Arranged: N/A DME Agency: NA       HH Arranged: NA HH Agency: NA         Social Determinants of Health (SDOH) Interventions SDOH Screenings   Food Insecurity: No Food Insecurity (10/01/2022)  Housing: Low Risk  (10/01/2022)  Transportation Needs: No Transportation Needs (10/01/2022)  Utilities: Not At Risk (08/27/2022)  Alcohol Screen: Low Risk  (02/28/2022)  Depression (PHQ2-9): Low Risk  (09/10/2022)  Financial Resource Strain: Medium Risk (02/28/2022)  Physical Activity: Insufficiently Active (02/28/2022)  Social Connections: Moderately Isolated (02/28/2022)  Stress: No Stress Concern Present (02/28/2022)  Tobacco Use: Low Risk  (10/01/2022)    Readmission Risk Interventions     No data to display

## 2022-10-04 NOTE — ED Notes (Signed)
Pt given denture cup with pt label applied to store dentures.  Top and bottom dentures placed in cup.  Pt given ice water per his request.

## 2022-10-04 NOTE — ED Notes (Signed)
[facility]  CALLED TO  TRANSPORT  PT  TO  [facility]

## 2022-10-04 NOTE — ED Provider Notes (Signed)
-----------------------------------------   10:02 AM on 10/04/2022 ----------------------------------------- Placement at [place] facility has been arranged per social work and patient is appropriate for discharge to nursing facility.   Blake Divine, MD 10/04/22 1003

## 2022-10-05 ENCOUNTER — Emergency Department (HOSPITAL_COMMUNITY): Payer: Medicare Other

## 2022-10-05 ENCOUNTER — Other Ambulatory Visit: Payer: Self-pay

## 2022-10-05 ENCOUNTER — Emergency Department (HOSPITAL_COMMUNITY)
Admission: EM | Admit: 2022-10-05 | Discharge: 2022-10-05 | Disposition: A | Discharge status: Home / Self Care | Payer: Medicare Other | Attending: Emergency Medicine | Admitting: Emergency Medicine

## 2022-10-05 DIAGNOSIS — J101 Influenza due to other identified influenza virus with other respiratory manifestations: Secondary | ICD-10-CM

## 2022-10-05 DIAGNOSIS — M542 Cervicalgia: Secondary | ICD-10-CM | POA: Insufficient documentation

## 2022-10-05 DIAGNOSIS — Z1152 Encounter for screening for COVID-19: Secondary | ICD-10-CM | POA: Insufficient documentation

## 2022-10-05 DIAGNOSIS — E039 Hypothyroidism, unspecified: Secondary | ICD-10-CM | POA: Diagnosis not present

## 2022-10-05 DIAGNOSIS — N189 Chronic kidney disease, unspecified: Secondary | ICD-10-CM | POA: Insufficient documentation

## 2022-10-05 DIAGNOSIS — I4891 Unspecified atrial fibrillation: Secondary | ICD-10-CM | POA: Insufficient documentation

## 2022-10-05 DIAGNOSIS — R930 Abnormal findings on diagnostic imaging of skull and head, not elsewhere classified: Secondary | ICD-10-CM | POA: Insufficient documentation

## 2022-10-05 DIAGNOSIS — I509 Heart failure, unspecified: Secondary | ICD-10-CM | POA: Diagnosis not present

## 2022-10-05 DIAGNOSIS — R0682 Tachypnea, not elsewhere classified: Secondary | ICD-10-CM | POA: Diagnosis present

## 2022-10-05 DIAGNOSIS — Z91010 Allergy to peanuts: Secondary | ICD-10-CM | POA: Insufficient documentation

## 2022-10-05 DIAGNOSIS — Z91040 Latex allergy status: Secondary | ICD-10-CM | POA: Insufficient documentation

## 2022-10-05 DIAGNOSIS — Z7984 Long term (current) use of oral hypoglycemic drugs: Secondary | ICD-10-CM | POA: Insufficient documentation

## 2022-10-05 DIAGNOSIS — Z79899 Other long term (current) drug therapy: Secondary | ICD-10-CM | POA: Diagnosis not present

## 2022-10-05 DIAGNOSIS — R9431 Abnormal electrocardiogram [ECG] [EKG]: Secondary | ICD-10-CM | POA: Diagnosis not present

## 2022-10-05 DIAGNOSIS — Z7901 Long term (current) use of anticoagulants: Secondary | ICD-10-CM | POA: Insufficient documentation

## 2022-10-05 DIAGNOSIS — R531 Weakness: Secondary | ICD-10-CM | POA: Diagnosis not present

## 2022-10-05 DIAGNOSIS — R059 Cough, unspecified: Secondary | ICD-10-CM | POA: Diagnosis not present

## 2022-10-05 LAB — URINALYSIS, ROUTINE W REFLEX MICROSCOPIC
Bacteria, UA: NONE SEEN
Bilirubin Urine: NEGATIVE
Glucose, UA: NEGATIVE mg/dL
Ketones, ur: NEGATIVE mg/dL
Leukocytes,Ua: NEGATIVE
Nitrite: NEGATIVE
Protein, ur: NEGATIVE mg/dL
Specific Gravity, Urine: 1.017 (ref 1.005–1.030)
pH: 6 (ref 5.0–8.0)

## 2022-10-05 LAB — LACTIC ACID, PLASMA
Lactic Acid, Venous: 1.5 mmol/L (ref 0.5–1.9)
Lactic Acid, Venous: 1.5 mmol/L (ref 0.5–1.9)

## 2022-10-05 LAB — CBC WITH DIFFERENTIAL/PLATELET
Abs Immature Granulocytes: 0.05 10*3/uL (ref 0.00–0.07)
Basophils Absolute: 0.1 10*3/uL (ref 0.0–0.1)
Basophils Relative: 1 %
Eosinophils Absolute: 0.7 10*3/uL — ABNORMAL HIGH (ref 0.0–0.5)
Eosinophils Relative: 7 %
HCT: 33.9 % — ABNORMAL LOW (ref 39.0–52.0)
Hemoglobin: 10.9 g/dL — ABNORMAL LOW (ref 13.0–17.0)
Immature Granulocytes: 1 %
Lymphocytes Relative: 9 %
Lymphs Abs: 0.9 10*3/uL (ref 0.7–4.0)
MCH: 31.8 pg (ref 26.0–34.0)
MCHC: 32.2 g/dL (ref 30.0–36.0)
MCV: 98.8 fL (ref 80.0–100.0)
Monocytes Absolute: 1 10*3/uL (ref 0.1–1.0)
Monocytes Relative: 10 %
Neutro Abs: 6.9 10*3/uL (ref 1.7–7.7)
Neutrophils Relative %: 72 %
Platelets: 188 10*3/uL (ref 150–400)
RBC: 3.43 MIL/uL — ABNORMAL LOW (ref 4.22–5.81)
RDW: 14.4 % (ref 11.5–15.5)
WBC: 9.6 10*3/uL (ref 4.0–10.5)
nRBC: 0 % (ref 0.0–0.2)

## 2022-10-05 LAB — COMPREHENSIVE METABOLIC PANEL
ALT: 27 U/L (ref 0–44)
AST: 27 U/L (ref 15–41)
Albumin: 2.3 g/dL — ABNORMAL LOW (ref 3.5–5.0)
Alkaline Phosphatase: 83 U/L (ref 38–126)
Anion gap: 9 (ref 5–15)
BUN: 14 mg/dL (ref 8–23)
CO2: 26 mmol/L (ref 22–32)
Calcium: 8.4 mg/dL — ABNORMAL LOW (ref 8.9–10.3)
Chloride: 102 mmol/L (ref 98–111)
Creatinine, Ser: 0.93 mg/dL (ref 0.61–1.24)
GFR, Estimated: >60 mL/min (ref >60)
Glucose, Bld: 123 mg/dL — ABNORMAL HIGH (ref 70–99)
Potassium: 3.6 mmol/L (ref 3.5–5.1)
Sodium: 137 mmol/L (ref 135–145)
Total Bilirubin: 1 mg/dL (ref 0.3–1.2)
Total Protein: 6.2 g/dL — ABNORMAL LOW (ref 6.5–8.1)

## 2022-10-05 LAB — RESPIRATORY PANEL BY PCR

## 2022-10-05 LAB — RESP PANEL BY RT-PCR (RSV, FLU A&B, COVID)  RVPGX2
Influenza A by PCR: POSITIVE — AB
Influenza B by PCR: NEGATIVE
Resp Syncytial Virus by PCR: NEGATIVE
SARS Coronavirus 2 by RT PCR: NEGATIVE

## 2022-10-05 NOTE — ED Notes (Signed)
[facility] called to transport back to [facility]

## 2022-10-05 NOTE — ED Notes (Signed)
Attempted to call report to [facility], no answer.

## 2022-10-05 NOTE — ED Notes (Signed)
Attempted to call Nicholas [name] [phone] no answer

## 2022-10-05 NOTE — ED Notes (Signed)
Patient transported to CT 

## 2022-10-05 NOTE — ED Provider Notes (Signed)
I provided a substantive portion of the care of this patient.  I personally performed the entirety of the history, exam, and medical decision making for this encounter.  Here with cough. Found to have flu. Neck pain is chronic, imaged and negative. Will treat symptomatically.         Jasson Siegmann, Corene Cornea, MD 10/05/22 [phone]

## 2022-10-05 NOTE — Discharge Instructions (Signed)
Tested positive for flu A today.  Negative CT head/neck.  Labs all normal.  No signs of sepsis. Make sure to rest and drink fluids. Follow-up with your primary care doctor. Return here for new concerns.

## 2022-10-05 NOTE — ED Notes (Signed)
Patient spilled his urinal in bed attempting to get urine spec. Patient cleaned and new linens applied, patient repositioned in bed for comfort and warm blankets given.

## 2022-10-05 NOTE — ED Notes (Signed)
Patient returned from CT

## 2022-10-05 NOTE — ED Provider Notes (Signed)
[facility] EMERGENCY DEPARTMENT Provider Note   CSN: [id number] Arrival date & time: 10/05/22  0119     History  Chief Complaint  Patient presents with   Neck Pain    Nicholas [name] is a 71 y.o. male.  The history is provided by the patient and medical records.  Neck Pain  71 y.o. male with history of A-fib on Eliquis, CHF, chronic kidney disease, dyslipidemia, GERD, hypothyroidism, recurrent falls, presenting to the ED for neck pain.  Sustained a fall about 1 week ago on 09/29/22-- seen at [facility] and had CT head but no imaging of the neck pursued.  States he was discharged to [facility] facility from [facility] and arrived there yesterday.  He denies any new falls/trauma.  Reports feeling generally weak, and also having a lingering cough for the past few weeks.  Some production of mucous.  EMS reports tachypnea and moderately low BP's in the 60'V systolic.  Patient has been hospitalized frequently for sepsis, last instance was 08/16/22 - 08/25/22 at [facility] with + cultures staph aureus.  Completed full course of IV abx and has had follow-up with ID.  Home Medications Prior to Admission medications   Medication Sig Start Date End Date Taking? Authorizing Provider  acetaminophen (TYLENOL) 500 MG tablet Take 1,000 mg by mouth 3 (three) times daily.    [provider]  amitriptyline (ELAVIL) 50 MG tablet TAKE TWO TABLETS BY MOUTH EVERYDAY AT BEDTIME Patient taking differently: Take 100 mg by mouth at bedtime. 12/26/21   [name], MD  apixaban (ELIQUIS) 5 MG TABS tablet Take 1 tablet (5 mg total) by mouth 2 (two) times daily. 09/27/22   [name], MD  atorvastatin (LIPITOR) 20 MG tablet TAKE ONE TABLET BY MOUTH ONCE DAILY 02/21/22   [name], MD  B Complex Vitamins (B-COMPLEX/B-12 PO) Take 1 tablet by mouth daily.    [provider]  cetirizine (ZYRTEC) 10 MG tablet Take 1 tablet (10 mg total) by mouth daily. 07/25/22   Michela Pitcher, NP   Cholecalciferol (VITAMIN D3) 25 MCG (1000 UT) CAPS Take 1 capsule (1,000 Units total) by mouth daily. 03/24/21   [name], MD  ciprofloxacin (CIPRO) 500 MG tablet SMARTSIG:1 Tablet(s) By Mouth Every 12 Hours 09/22/22   [provider]  clotrimazole (LOTRIMIN) 1 % cream Apply 1 Application topically 2 (two) times daily as needed (rash). 08/28/22   Jennye Boroughs, MD  diphenhydrAMINE (BENADRYL) 25 mg capsule Take 25 mg by mouth every 6 (six) hours as needed for allergies.    [provider]  divalproex (DEPAKOTE ER) 500 MG 24 hr tablet TAKE ONE TABLET BY MOUTH EVERYDAY AT BEDTIME 06/20/22   Suzzanne Cloud, NP  EPINEPHrine 0.3 mg/0.3 mL IJ SOAJ injection Inject 0.3 mg into the muscle as needed for anaphylaxis. (bee stings) Patient not taking: Reported on 10/02/2022 12/18/21   [name], MD  ferrous sulfate 325 (65 FE) MG EC tablet Take 325 mg by mouth daily with breakfast.    [provider]  fluticasone (FLONASE) 50 MCG/ACT nasal spray Place 2 sprays into both nostrils daily. 07/25/22   Michela Pitcher, NP  Fremanezumab-vfrm (AJOVY) 225 MG/1.5ML SOAJ Inject 225 mg into the skin every 30 (thirty) days. 07/24/22   Suzzanne Cloud, NP  furosemide (LASIX) 20 MG tablet TAKE ONE TABLET BY MOUTH EVERY MORNING 05/23/22   [name], MD  gabapentin (NEURONTIN) 300 MG capsule TAKE ONE CAPSULE BY MOUTH EVERY MORNING and TAKE TWO  CAPSULES BY MOUTH AT NOON and TAKE TWO CAPSULES BY MOUTH EVERY EVENING 08/16/22   [name], MD  HYDROcodone-acetaminophen (NORCO/VICODIN) 5-325 MG tablet Take 1 tablet by mouth every 6 (six) hours as needed for moderate pain.    [provider]  levothyroxine (SYNTHROID) 112 MCG tablet Take 1 tablet (112 mcg total) by mouth daily before breakfast. 02/24/22   [name], MD  metFORMIN (GLUCOPHAGE) 500 MG tablet Take 500 mg by mouth daily.    [provider]  methocarbamol (ROBAXIN) 500 MG tablet TAKE ONE TABLET BY  MOUTH twice daily AS NEEDED FOR muscle SPASMS 07/27/22   Newt Minion, MD  metoprolol succinate (TOPROL-XL) 100 MG 24 hr tablet Take 1 tablet (100 mg total) by mouth at bedtime. Take with or immediately following a meal. 08/28/22   Jennye Boroughs, MD  nitroGLYCERIN (NITROSTAT) 0.4 MG SL tablet Place 1 tablet (0.4 mg total) under the tongue every 5 (five) minutes as needed for chest pain. Patient not taking: Reported on 10/02/2022 11/27/21   [name], MD  nystatin (MYCOSTATIN) 100000 UNIT/ML suspension Take 5 mLs (500,000 Units total) by mouth 4 (four) times daily. 09/26/22   [name], MD  ondansetron (ZOFRAN-ODT) 4 MG disintegrating tablet TAKE ONE TABLET BY MOUTH every EIGHT hours AS NEEDED 10/04/22   Suzzanne Cloud, NP  pantoprazole (PROTONIX) 40 MG tablet Take 40 mg by mouth every other day. 03/01/22   [name], MD  tamsulosin (FLOMAX) 0.4 MG CAPS capsule Take 1 capsule (0.4 mg total) by mouth daily. 09/27/22   [name], MD  tiZANidine (ZANAFLEX) 4 MG tablet TAKE ONE TABLET BY MOUTH every SIX hours AS NEEDED FOR acute HEADACHE 10/04/22   Suzzanne Cloud, NP  Ubrogepant (UBRELVY) 100 MG TABS Take 100 mg by mouth as needed (take 1 tablet at onset of headache, may repeat in hours, max is 200 mg in 24 hours). Patient taking differently: Take 100 mg by mouth as needed (Give 1 tablet by mouth every 12 hours as needed for headache). 07/24/22   Suzzanne Cloud, NP      Allergies    Peanut-containing drug products, Yellow jacket venom [bee venom], Celebrex [celecoxib], Percocet [oxycodone-acetaminophen], Latex, and Nsaids    Review of Systems   Review of Systems  Respiratory:  Positive for cough.   Musculoskeletal:  Positive for neck pain.  All other systems reviewed and are negative.   Physical Exam Updated Vital Signs BP 114/74   Pulse 87   Temp 97.9 F (36.6 C) (Oral)   Resp (!) 25   Ht '5\' 8"'$  (1.727 m)   Wt 99.8 kg   SpO2 99%   BMI 33.45 kg/m    Physical Exam Vitals and nursing note reviewed.  Constitutional:      Appearance: He is well-developed.     Comments: elderly  HENT:     Head: Normocephalic and atraumatic.  Eyes:     Conjunctiva/sclera: Conjunctivae normal.     Pupils: Pupils are equal, round, and reactive to light.  Neck:     Comments: Neck supple, no rigidity appreciated Cardiovascular:     Rate and Rhythm: Normal rate and regular rhythm.     Heart sounds: Normal heart sounds.  Pulmonary:     Effort: Pulmonary effort is normal.     Breath sounds: Normal breath sounds.     Comments: Dry cough on exam, NAD, able to speak in sentences without difficulty Abdominal:     General: Bowel sounds are  normal.     Palpations: Abdomen is soft.  Musculoskeletal:        General: Normal range of motion.     Cervical back: Normal range of motion and neck supple.  Skin:    General: Skin is warm and dry.  Neurological:     Mental Status: He is alert and oriented to person, place, and time.     ED Results / Procedures / Treatments   Labs (all labs ordered are listed, but only abnormal results are displayed) Labs Reviewed  RESP PANEL BY RT-PCR (RSV, FLU A&B, COVID)  RVPGX2 - Abnormal; Notable for the following components:      Result Value   Influenza A by PCR POSITIVE (*)    All other components within normal limits  CBC WITH DIFFERENTIAL/PLATELET - Abnormal; Notable for the following components:   RBC 3.43 (*)    Hemoglobin 10.9 (*)    HCT 33.9 (*)    Eosinophils Absolute 0.7 (*)    All other components within normal limits  COMPREHENSIVE METABOLIC PANEL - Abnormal; Notable for the following components:   Glucose, Bld 123 (*)    Calcium 8.4 (*)    Total Protein 6.2 (*)    Albumin 2.3 (*)    All other components within normal limits  URINALYSIS, ROUTINE W REFLEX MICROSCOPIC - Abnormal; Notable for the following components:   Hgb urine dipstick SMALL (*)    All other components within normal limits  CULTURE,  BLOOD (ROUTINE X 2)  CULTURE, BLOOD (ROUTINE X 2)  URINE CULTURE  RESPIRATORY PANEL BY PCR  LACTIC ACID, PLASMA  LACTIC ACID, PLASMA    EKG None  Radiology CT HEAD WO CONTRAST (5MM)  Result Date: 10/05/2022 CLINICAL DATA:  Facial trauma, blunt EXAM: CT HEAD WITHOUT CONTRAST CT CERVICAL SPINE WITHOUT CONTRAST TECHNIQUE: Multidetector CT imaging of the head and cervical spine was performed following the standard protocol without intravenous contrast. Multiplanar CT image reconstructions of the cervical spine were also generated. RADIATION DOSE REDUCTION: This exam was performed according to the departmental dose-optimization program which includes automated exposure control, adjustment of the mA and/or kV according to patient size and/or use of iterative reconstruction technique. COMPARISON:  CT head and C-spine 08/12/2022 FINDINGS: CT HEAD FINDINGS Brain: Patchy and confluent areas of decreased attenuation are noted throughout the deep and periventricular white matter of the cerebral hemispheres bilaterally, compatible with chronic microvascular ischemic disease. No evidence of large-territorial acute infarction. No parenchymal hemorrhage. No mass lesion. No extra-axial collection. No mass effect or midline shift. No hydrocephalus. Basilar cisterns are patent. Vascular: No hyperdense vessel. Skull: No acute fracture or focal lesion. Surgical hardware of the right facial bones. Sinuses/Orbits: Paranasal sinuses and mastoid air cells are clear. Bilateral lens replacement. Otherwise the orbits are unremarkable. Other: None. CT CERVICAL SPINE FINDINGS Alignment: Normal. Skull base and vertebrae: Multilevel moderate to severe degenerative changes spine. Multilevel moderate to severe osseous stenosis. No severe osseous central canal stenosis. No acute fracture. No aggressive appearing focal osseous lesion or focal pathologic process. Soft tissues and spinal canal: No prevertebral fluid or swelling. No  visible canal hematoma. Upper chest: Unremarkable. Other: None. IMPRESSION: 1. No acute intracranial abnormality. 2. No acute displaced fracture or traumatic listhesis of the cervical spine. Electronically Signed   By: Iven Finn M.D.   On: 10/05/2022 02:55   CT Cervical Spine Wo Contrast  Result Date: 10/05/2022 CLINICAL DATA:  Facial trauma, blunt EXAM: CT HEAD WITHOUT CONTRAST CT CERVICAL SPINE WITHOUT CONTRAST  TECHNIQUE: Multidetector CT imaging of the head and cervical spine was performed following the standard protocol without intravenous contrast. Multiplanar CT image reconstructions of the cervical spine were also generated. RADIATION DOSE REDUCTION: This exam was performed according to the departmental dose-optimization program which includes automated exposure control, adjustment of the mA and/or kV according to patient size and/or use of iterative reconstruction technique. COMPARISON:  CT head and C-spine 08/12/2022 FINDINGS: CT HEAD FINDINGS Brain: Patchy and confluent areas of decreased attenuation are noted throughout the deep and periventricular white matter of the cerebral hemispheres bilaterally, compatible with chronic microvascular ischemic disease. No evidence of large-territorial acute infarction. No parenchymal hemorrhage. No mass lesion. No extra-axial collection. No mass effect or midline shift. No hydrocephalus. Basilar cisterns are patent. Vascular: No hyperdense vessel. Skull: No acute fracture or focal lesion. Surgical hardware of the right facial bones. Sinuses/Orbits: Paranasal sinuses and mastoid air cells are clear. Bilateral lens replacement. Otherwise the orbits are unremarkable. Other: None. CT CERVICAL SPINE FINDINGS Alignment: Normal. Skull base and vertebrae: Multilevel moderate to severe degenerative changes spine. Multilevel moderate to severe osseous stenosis. No severe osseous central canal stenosis. No acute fracture. No aggressive appearing focal osseous lesion  or focal pathologic process. Soft tissues and spinal canal: No prevertebral fluid or swelling. No visible canal hematoma. Upper chest: Unremarkable. Other: None. IMPRESSION: 1. No acute intracranial abnormality. 2. No acute displaced fracture or traumatic listhesis of the cervical spine. Electronically Signed   By: Iven Finn M.D.   On: 10/05/2022 02:55   DG Chest Port 1 View  Result Date: 10/05/2022 CLINICAL DATA:  Cough, generalized weakness. EXAM: PORTABLE CHEST 1 VIEW COMPARISON:  10/01/2022. FINDINGS: Heart is enlarged and the mediastinal contour is stable. There is atherosclerotic calcification of the aorta. Lung volumes are low and there is elevation of the right diaphragm. Mild atelectasis or scarring is noted bilaterally. No effusion or pneumothorax. Shoulder arthroplasty changes are present bilaterally. IMPRESSION: 1. No active disease. 2. Cardiomegaly. Electronically Signed   By: Brett Fairy M.D.   On: 10/05/2022 02:16    Procedures Procedures    Medications Ordered in ED Medications - No data to display  ED Course/ Medical Decision Making/ A&P                           Medical Decision Making Amount and/or Complexity of Data Reviewed External Data Reviewed: labs, radiology, ECG and notes. Labs: ordered. Radiology: ordered and independent interpretation performed. ECG/medicine tests: ordered and independent interpretation performed.   71 year old male presenting to the ED with neck pain and cough.  Had a fall about a week ago, seen at [facility] and had CT head performed but no neck imaging pursued.  He reports ongoing pain since then.  Has also had a lingering cough for about 2 weeks now.  Had marginal blood pressures with EMS but vital stable upon arrival to the ED.  He is nontoxic in appearance.  He has normal range of motion of the neck, no rigidity.  He has no focal deficits.  Does have dry cough but is in no acute respiratory distress.  Has been hospitalized many times for  sepsis in the past.  Will obtain labs, screen for sepsis, CT head and neck, chest x-ray, RVP.  Labs overall reassuring, normal lactate x 2, no leukocytosis.  No electrolyte derangement.  UA without any bacteria or acute signs of infection.  Urine culture sent.  RVP is positive for influenza  A.  CT head and neck negative for any acute findings.  Chest x-ray is clear.  Suspect cough related to flu A.  Neck pain may be from fall but also seems to be somewhat of a chronic issue.  No signs/symptoms concerning for meningitis.  He is out of the window to initiate tamiflu, feel is stable for discharge back to facility with symptomatic care.   Can follow-up with PCP.  Return here for new concerns.  Final Clinical Impression(s) / ED Diagnoses Final diagnoses:  Influenza A    Rx / DC Orders ED Discharge Orders     None         Larene Pickett, PA-C 10/05/22 5430    Merrily Pew, MD 10/05/22 [phone]

## 2022-10-05 NOTE — ED Triage Notes (Signed)
Patient bib [facility] from [facility] with complaints of Neck pain for the past week after fall. He was found to have urosepsis about a week ago. He has had a non productive cough since being in the hospital on 12/14. [facility] reports tachycardia, tachypnea and Alert and Oriented x 4.

## 2022-10-06 LAB — URINE CULTURE: Culture: 10000 — AB

## 2022-10-08 ENCOUNTER — Telehealth: Payer: Self-pay | Admitting: Family Medicine

## 2022-10-08 DIAGNOSIS — N1831 Chronic kidney disease, stage 3a: Secondary | ICD-10-CM

## 2022-10-08 DIAGNOSIS — Z8673 Personal history of transient ischemic attack (TIA), and cerebral infarction without residual deficits: Secondary | ICD-10-CM

## 2022-10-08 DIAGNOSIS — I5032 Chronic diastolic (congestive) heart failure: Secondary | ICD-10-CM

## 2022-10-08 NOTE — Telephone Encounter (Signed)
-----   Message from Clerance Lav, RN sent at 10/05/2022 10:53 AM EST ----- Regarding: CCM Appt Your patient has a scheduled CCM appointment in the next 7 days.   CCM [phone]) If you wish for your patient to continue to have uninterrupted CCM services, please enter the [id number] referral order. If [id number] referral order is not received, your patient will be un-enrolled from CCM services.   Pharmacy Only [phone]) If you wish for your patient to continue receiving pharmacy support without being enrolled in CCM, please utilize the [id number] referral order.   [facility] Care Coordination [phone]) Your [facility] patient may also be referred to the Care Coordination team for Nursing and [place] support via the [id number] referral order.   Thank you

## 2022-10-08 NOTE — Telephone Encounter (Signed)
CCM referral placed °

## 2022-10-09 ENCOUNTER — Ambulatory Visit: Payer: Medicare Other | Attending: Cardiology | Admitting: Cardiology

## 2022-10-10 ENCOUNTER — Encounter: Payer: Self-pay | Admitting: Cardiology

## 2022-10-10 DIAGNOSIS — G4489 Other headache syndrome: Secondary | ICD-10-CM | POA: Diagnosis not present

## 2022-10-10 DIAGNOSIS — Z7901 Long term (current) use of anticoagulants: Secondary | ICD-10-CM | POA: Diagnosis not present

## 2022-10-10 DIAGNOSIS — Z4781 Encounter for orthopedic aftercare following surgical amputation: Secondary | ICD-10-CM | POA: Diagnosis not present

## 2022-10-10 DIAGNOSIS — I5032 Chronic diastolic (congestive) heart failure: Secondary | ICD-10-CM | POA: Diagnosis not present

## 2022-10-10 DIAGNOSIS — I739 Peripheral vascular disease, unspecified: Secondary | ICD-10-CM | POA: Diagnosis not present

## 2022-10-10 DIAGNOSIS — M5136 Other intervertebral disc degeneration, lumbar region: Secondary | ICD-10-CM | POA: Diagnosis not present

## 2022-10-10 DIAGNOSIS — M064 Inflammatory polyarthropathy: Secondary | ICD-10-CM | POA: Diagnosis not present

## 2022-10-10 DIAGNOSIS — R296 Repeated falls: Secondary | ICD-10-CM | POA: Diagnosis not present

## 2022-10-10 DIAGNOSIS — I4891 Unspecified atrial fibrillation: Secondary | ICD-10-CM | POA: Diagnosis not present

## 2022-10-10 DIAGNOSIS — Z89422 Acquired absence of other left toe(s): Secondary | ICD-10-CM | POA: Diagnosis not present

## 2022-10-10 LAB — CULTURE, BLOOD (ROUTINE X 2)
Culture: NO GROWTH
Culture: NO GROWTH
Special Requests: ADEQUATE

## 2022-10-12 ENCOUNTER — Telehealth: Payer: Self-pay | Admitting: Family Medicine

## 2022-10-12 NOTE — Telephone Encounter (Signed)
Nicholas [name] from [facility] would like confirmation for diagnosis for home care,and would  like to know the diagnosis for the use of medication ciprofloxacin (CIPRO) 500 MG tablet ?  Fax# [phone] for information to be sent to

## 2022-10-12 NOTE — Telephone Encounter (Signed)
Home care need is gen weakness, recurrent falls, memory difficulty and chronic diastolic CHF. He was on ciprofloxacin '500mg'$  bid for UTI, but should have completed treatment on 09/28/2022.

## 2022-10-12 NOTE — Telephone Encounter (Signed)
Printed Dr. Synthia Innocent note and faxed to [facility].

## 2022-10-17 ENCOUNTER — Other Ambulatory Visit: Payer: Self-pay

## 2022-10-17 ENCOUNTER — Emergency Department
Admission: EM | Admit: 2022-10-17 | Discharge: 2022-10-18 | Disposition: A | Discharge status: Skilled Nursing Facility | Payer: Medicare Other | Attending: Emergency Medicine | Admitting: Emergency Medicine

## 2022-10-17 ENCOUNTER — Emergency Department: Payer: Medicare Other

## 2022-10-17 DIAGNOSIS — M5136 Other intervertebral disc degeneration, lumbar region: Secondary | ICD-10-CM | POA: Diagnosis not present

## 2022-10-17 DIAGNOSIS — N183 Chronic kidney disease, stage 3 unspecified: Secondary | ICD-10-CM | POA: Diagnosis not present

## 2022-10-17 DIAGNOSIS — I5032 Chronic diastolic (congestive) heart failure: Secondary | ICD-10-CM | POA: Insufficient documentation

## 2022-10-17 DIAGNOSIS — M792 Neuralgia and neuritis, unspecified: Secondary | ICD-10-CM | POA: Diagnosis not present

## 2022-10-17 DIAGNOSIS — Z743 Need for continuous supervision: Secondary | ICD-10-CM | POA: Diagnosis not present

## 2022-10-17 DIAGNOSIS — J45909 Unspecified asthma, uncomplicated: Secondary | ICD-10-CM | POA: Insufficient documentation

## 2022-10-17 DIAGNOSIS — R4182 Altered mental status, unspecified: Secondary | ICD-10-CM | POA: Diagnosis not present

## 2022-10-17 DIAGNOSIS — Z89429 Acquired absence of other toe(s), unspecified side: Secondary | ICD-10-CM | POA: Diagnosis not present

## 2022-10-17 DIAGNOSIS — R001 Bradycardia, unspecified: Secondary | ICD-10-CM | POA: Diagnosis not present

## 2022-10-17 DIAGNOSIS — R410 Disorientation, unspecified: Secondary | ICD-10-CM | POA: Diagnosis not present

## 2022-10-17 DIAGNOSIS — Z20822 Contact with and (suspected) exposure to covid-19: Secondary | ICD-10-CM | POA: Diagnosis not present

## 2022-10-17 DIAGNOSIS — M064 Inflammatory polyarthropathy: Secondary | ICD-10-CM | POA: Diagnosis not present

## 2022-10-17 DIAGNOSIS — Z789 Other specified health status: Secondary | ICD-10-CM | POA: Diagnosis not present

## 2022-10-17 DIAGNOSIS — Z96653 Presence of artificial knee joint, bilateral: Secondary | ICD-10-CM | POA: Insufficient documentation

## 2022-10-17 DIAGNOSIS — Z931 Gastrostomy status: Secondary | ICD-10-CM | POA: Diagnosis not present

## 2022-10-17 DIAGNOSIS — E039 Hypothyroidism, unspecified: Secondary | ICD-10-CM | POA: Insufficient documentation

## 2022-10-17 DIAGNOSIS — Z8619 Personal history of other infectious and parasitic diseases: Secondary | ICD-10-CM | POA: Diagnosis not present

## 2022-10-17 DIAGNOSIS — R6889 Other general symptoms and signs: Secondary | ICD-10-CM | POA: Diagnosis not present

## 2022-10-17 DIAGNOSIS — K5901 Slow transit constipation: Secondary | ICD-10-CM | POA: Diagnosis not present

## 2022-10-17 DIAGNOSIS — I499 Cardiac arrhythmia, unspecified: Secondary | ICD-10-CM | POA: Diagnosis not present

## 2022-10-17 DIAGNOSIS — R079 Chest pain, unspecified: Secondary | ICD-10-CM | POA: Diagnosis not present

## 2022-10-17 DIAGNOSIS — Z4781 Encounter for orthopedic aftercare following surgical amputation: Secondary | ICD-10-CM | POA: Diagnosis not present

## 2022-10-17 LAB — COMPREHENSIVE METABOLIC PANEL
ALT: 10 U/L (ref 0–44)
AST: 20 U/L (ref 15–41)
Albumin: 3 g/dL — ABNORMAL LOW (ref 3.5–5.0)
Alkaline Phosphatase: 84 U/L (ref 38–126)
Anion gap: 12 (ref 5–15)
BUN: 24 mg/dL — ABNORMAL HIGH (ref 8–23)
CO2: 26 mmol/L (ref 22–32)
Calcium: 8.4 mg/dL — ABNORMAL LOW (ref 8.9–10.3)
Chloride: 98 mmol/L (ref 98–111)
Creatinine, Ser: 0.96 mg/dL (ref 0.61–1.24)
GFR, Estimated: >60 mL/min (ref >60)
Glucose, Bld: 97 mg/dL (ref 70–99)
Potassium: 3.5 mmol/L (ref 3.5–5.1)
Sodium: 136 mmol/L (ref 135–145)
Total Bilirubin: 1.2 mg/dL (ref 0.3–1.2)
Total Protein: 7 g/dL (ref 6.5–8.1)

## 2022-10-17 LAB — CBC
HCT: 41.3 % (ref 39.0–52.0)
Hemoglobin: 13 g/dL (ref 13.0–17.0)
MCH: 31.3 pg (ref 26.0–34.0)
MCHC: 31.5 g/dL (ref 30.0–36.0)
MCV: 99.3 fL (ref 80.0–100.0)
Platelets: 502 10*3/uL — ABNORMAL HIGH (ref 150–400)
RBC: 4.16 MIL/uL — ABNORMAL LOW (ref 4.22–5.81)
RDW: 14.6 % (ref 11.5–15.5)
WBC: 12.5 10*3/uL — ABNORMAL HIGH (ref 4.0–10.5)
nRBC: 0 % (ref 0.0–0.2)

## 2022-10-17 LAB — RESP PANEL BY RT-PCR (RSV, FLU A&B, COVID)  RVPGX2
Influenza A by PCR: POSITIVE — AB
Influenza B by PCR: NEGATIVE
Resp Syncytial Virus by PCR: NEGATIVE
SARS Coronavirus 2 by RT PCR: NEGATIVE

## 2022-10-17 LAB — PROTIME-INR
INR: 1.8 — ABNORMAL HIGH (ref 0.8–1.2)
Prothrombin Time: 20.6 seconds — ABNORMAL HIGH (ref 11.4–15.2)

## 2022-10-17 LAB — TROPONIN I (HIGH SENSITIVITY): Troponin I (High Sensitivity): 4 ng/L (ref <18)

## 2022-10-17 NOTE — Discharge Instructions (Signed)
Your work-up in the Emergency department was reassuring including CAT scan of your brain, EKG and blood work.  If you have recurrent episodes of altered mental status or develop symptoms that are otherwise concerning to you please return to the emergency department or follow-up with your primary care doctor.

## 2022-10-17 NOTE — ED Provider Notes (Addendum)
[facility] Provider Note    Event Date/Time   First MD Initiated Contact with Patient 10/17/22 1904     (approximate)   History   Altered Mental Status   HPI  Nicholas [name] is a 72 y.o. male with past medical history of A-fib on Eliquis CHF CKD hyperlipidemia GERD hypothyroidism who presents because of altered mental status.  The patient tells me that he currently feels fine he is denying any headache nausea vomiting diarrhea chest pain shortness of breath or confusion.  Per the triage note patient had a very brief episode of altered mental status at the facility and apparently was incontinent (unclear if this was of urine or stool).  He has incontinence at baseline however wears briefs.  Patient's son tells me that the facility told him that during the episode of altered mental status patient had complained of chest pain.  Patient currently is denying any complaints.  Was diagnosed with influenza last week.  Past Medical History:  Diagnosis Date   Acute kidney failure 08/2008   "cleared up"  no problems since   Allergy    Anxiety    Arthritis    Asthma    ?of this no inhaler   Blood transfusion    CHF (congestive heart failure) ([place])    ?of this, pt denies   CLOSTRIDIUM DIFFICILE COLITIS 07/04/2010   Annotation: 12/09, 2/10 Qualifier: Diagnosis of  By: [name], [name]     COVID-19 08/12/22 completed Paxlovid 08/20/22 08/26/2022   Depression with anxiety    Duodenitis determined by biopsy 02/2016   peptic likely due to aleve (erosive gastropathy with duodenal erosions)   ECZEMA 07/04/2010   Qualifier: Diagnosis of  By: [name], [name]     Elevated liver enzymes    Fatty liver    Full dentures    GERD (gastroesophageal reflux disease)    Heart attack ([place])    08/2008 (likely demand ischemia in the setting of MSSA sepsis/R TKA  infection)10-2008   History of hiatal hernia    History of stomach ulcers    Hypothyroidism    Lower GI bleeding     MALAR AND MAXILLARY BONES CLOSED FRACTURE 07/04/2010   Annotation: ORIF Qualifier: Diagnosis of  By: [name], [name]     Migraine    "definitely"   MRSA (methicillin resistant Staphylococcus aureus)    in leg, had to place steel rod in leg   Personal history of colonic adenoma 06/01/2003   PFO (patent foramen ovale)    small PFO by 08/2008 TEE   Pneumonia 08/2008   "while in ICU"   Pneumonia due to COVID-19 virus 08/12/2022   Seizures ([place]) 2009   "long time ago"     Sleep apnea    does not wear CPAP   Stroke ([place]) 2010   unable to complete sentences at times   Wears glasses     Patient Active Problem List   Diagnosis Date Noted   UTI (urinary tract infection) 09/27/2022   Petechial rash 09/27/2022   SOB (shortness of breath) 08/24/2022   Sepsis ([place]) 08/23/2022   MSSA bacteremia 08/16/2022 08/18/2022   Seizure disorder ([place]) 08/17/2022   Atrial fibrillation with RVR ([place]) 08/17/2022   Fall 08/17/2022   Weakness 08/16/2022   Acute urinary retention 08/13/2022   History of seizure 78/67/6720   Acute metabolic encephalopathy 94/70/9628   PND (post-nasal drip) 07/25/2022   Oral candida 07/15/2022   Open wound of foot with complication,  left, initial encounter 06/16/2022   Hx of excision of lamina of lumbar vertebra for decompression of spinal cord 03/20/2022   Peripheral edema 02/28/2022   Recurrent major depressive disorder ([place]) 02/28/2022   Chronic headache 02/28/2022   Recurrent falls 08/25/2021   Memory difficulty 07/04/2021   Chronic toe ulcer, left, with fat layer exposed ([place]) 01/23/2021   Pituitary adenoma ([place]) 09/06/2020   Chronic intractable headache 07/27/2020   Vitamin B12 deficiency 03/31/2020   Vitamin D deficiency 03/31/2020   Numbness of foot 03/25/2020   Status post amputation of great toe, left ([place]) 12/14/2019   Callus of foot 04/02/2019   Allergy to bee sting 03/11/2019   Insomnia 02/10/2019   Status post lumbar spine operative procedure for  decompression of spinal cord 12/12/2018   Dyslipidemia 10/19/2018   Foot deformity 10/19/2018   Status post reverse total replacement of right shoulder 05/13/2018   Chronic diastolic CHF (congestive heart failure) ([place]) 12/27/2017   OSA (obstructive sleep apnea) 12/27/2017   CKD (chronic kidney disease) stage 3, GFR 30-59 ml/min (HCC) 10/01/2017   Fatty liver 10/01/2017   Status post reverse total replacement of left shoulder 09/03/2017   Bilateral shoulder pain 05/24/2017   History of CVA in adulthood 03/08/2017   S/P total knee arthroplasty, left 01/30/2017   Osteoarthritis of knees, bilateral 01/15/2017   Obesity, morbid, BMI 40.0-49.9 ([place]) 07/19/2016   Hypothyroidism    Iron deficiency 04/23/2016   Chronic pain syndrome 04/23/2016   Inflammatory arthritis 04/23/2016   Chronic leg pain 04/23/2016   Lesion of palate 04/23/2016   Grieving 04/23/2016   GERD (gastroesophageal reflux disease)    Duodenitis determined by biopsy 02/13/2016   Diverticulosis of colon without hemorrhage 01/31/2016   Migraine 01/11/2012   History of methicillin resistant staphylococcus aureus (MRSA) 07/04/2010   DDD (degenerative disc disease), lumbar 07/04/2010   History of total right knee replacement 07/04/2010   Contact dermatitis 07/04/2010   Lesion of ulnar nerve 07/04/2010   Closed fracture of malar bone ([place]) 07/04/2010   Methicillin susceptible Staphylococcus aureus infection 07/04/2010   Narrowing of intervertebral disc space 07/04/2010   Olecranon bursitis 07/04/2010   History of artificial joint 07/04/2010   Primary osteoarthritis, right shoulder 06/09/2007   Osteoarthritis 06/09/2007   Personal history of colonic adenoma 06/01/2003     Physical Exam  Triage Vital Signs: ED Triage Vitals [10/17/22 1615]  Enc Vitals Group     BP 110/82     Pulse Rate 76     Resp 18     Temp 97.6 F (36.4 C)     Temp Source Oral     SpO2 90 %     Weight      Height      Head Circumference       Peak Flow      Pain Score      Pain Loc      Pain Edu?      Excl. in [place]?     Most recent vital signs: Vitals:   10/17/22 2005 10/17/22 2315  BP: 117/74 129/73  Pulse: 73 78  Resp: 18 20  Temp: 98.1 F (36.7 C) 98.2 F (36.8 C)  SpO2: 96% 94%     General: Awake, no distress.  CV:  Good peripheral perfusion.  Resp:  Normal effort.  Frequent cough but no increased work of breathing Abd:  No distention.  Neuro:             Awake, Alert, Oriented x  3  Other:  Aox3, nml speech  PERRL, EOMI, face symmetric, nml tongue movement  5/5 strength in the BL upper and lower extremities  Sensation grossly intact in the BL upper and lower extremities  Finger-nose-finger intact BL     ED Results / Procedures / Treatments  Labs (all labs ordered are listed, but only abnormal results are displayed) Labs Reviewed  RESP PANEL BY RT-PCR (RSV, FLU A&B, COVID)  RVPGX2 - Abnormal; Notable for the following components:      Result Value   Influenza A by PCR POSITIVE (*)    All other components within normal limits  COMPREHENSIVE METABOLIC PANEL - Abnormal; Notable for the following components:   BUN 24 (*)    Calcium 8.4 (*)    Albumin 3.0 (*)    All other components within normal limits  CBC - Abnormal; Notable for the following components:   WBC 12.5 (*)    RBC 4.16 (*)    Platelets 502 (*)    All other components within normal limits  PROTIME-INR - Abnormal; Notable for the following components:   Prothrombin Time 20.6 (*)    INR 1.8 (*)    All other components within normal limits  TROPONIN I (HIGH SENSITIVITY)     EKG  EKG reviewed interpreted myself shows sinus rhythm with PVCs, low voltage, poor R wave progression, left axis deviation   RADIOLOGY I reviewed and interpreted the CT scan of the brain which does not show any acute intracranial process    PROCEDURES:  Critical Care performed: No  .1-3 Lead EKG Interpretation  Performed by: Rada Hay,  MD Authorized by: Rada Hay, MD     Interpretation: normal     ECG rate assessment: normal     Rhythm: sinus rhythm     Ectopy: none     Conduction: normal     The patient is on the cardiac monitor to evaluate for evidence of arrhythmia and/or significant heart rate changes.   MEDICATIONS ORDERED IN ED: Medications - No data to display   IMPRESSION / MDM / [place] / ED COURSE  I reviewed the triage vital signs and the nursing notes.                              Patient's presentation is most consistent with acute presentation with potential threat to life or bodily function.  Differential diagnosis includes, but is not limited to, seizure, syncope, metabolic encephalopathy, intracranial hemorrhage, CVA  The patient is a 72 year old male with multiple medical comorbidities presents after an episode of altered mental status.  Somewhat unclear exactly what happened but per triage note patient had a brief episode of altered mental status there is no report of him losing postural tone but apparently per his son he had complained of chest pain during this episode.  Patient does not really recall it he is denying chest pain denies any confusion or really any symptoms at this time.  His vitals are reassuring.  On exam he has a nonfocal neurologic exam is alert and oriented x 3.  Does have frequent cough and was recently diagnosed with influenza but he is not hypoxic.  Does have white count of 12.5.  CMP reassuring.  Chest x-ray does not have any acute findings or lobar infiltrate.  CT head obtained is negative for acute abnormality.  Given the possible complaint of chest pain did obtain EKG and  plan to obtain troponin which is pending at the time of signout.  Patient is back to baseline without complaints if troponin is negative I feel that he can be safely discharged.  It is possible that this was a seizure.  I do see history of seizure disorder in patient's chart, so this  presumably could have been a seizure.  I see he is on Depakote.  Even if this was a brief seizure given he is back to baseline without recurrence I think that he can still be safely discharged  Trop is negative. Will dc patient back to facility.      FINAL CLINICAL IMPRESSION(S) / ED DIAGNOSES   Final diagnoses:  Altered mental status, unspecified altered mental status type     Rx / DC Orders   ED Discharge Orders     None        Note:  This document was prepared using Dragon voice recognition software and may include unintentional dictation errors.   Rada Hay, MD 10/17/22 2333    Rada Hay, MD 10/17/22 [phone]

## 2022-10-17 NOTE — ED Triage Notes (Signed)
Pt arrived via EMS form [place] [facility]. Pt was sent due to brief moment of AMS. However pt is at baseline at this time. Pt does wear incontinence briefs but was incontinent when he had the episode of AMS.

## 2022-10-17 NOTE — ED Provider Triage Note (Signed)
Emergency Medicine Provider Triage Evaluation Note  Nicholas [name], a 72 y.o. male  was evaluated in triage.  Pt complains of AMS.  Patient presents via EMS from his facility, with a reported an episode of slow response but denies frank LOC.  Patient is unable to give history of present illness.  He denies any pain at this time, nausea, vomiting, or diarrhea.  EMS reports patient was at baseline according to facility staff when they presented to evaluate and transport the patient.  Review of Systems  Positive: AMS Negative: FCS, CP  Physical Exam  BP 110/82 (BP Location: Right Arm)   Pulse 76   Temp 97.6 F (36.4 C) (Oral)   Resp 18   SpO2 90%  Gen:   Awake, no distress  NAD Resp:  Normal effort  MSK:   Moves extremities without difficulty  Other:    Medical Decision Making  Medically screening exam initiated at 4:35 PM.  Appropriate orders placed.  Nicholas [name] was informed that the remainder of the evaluation will be completed by another provider, this initial triage assessment does not replace that evaluation, and the importance of remaining in the ED until their evaluation is complete.  Geriatric patient to the ED for evaluation of AMS as reported by his facility staff.  Patient presents at baseline at this time according to staff and EMS report.   Nicholas Needles, PA-C 10/17/22 1636

## 2022-10-18 ENCOUNTER — Other Ambulatory Visit: Payer: Self-pay | Admitting: Family Medicine

## 2022-10-18 DIAGNOSIS — R531 Weakness: Secondary | ICD-10-CM | POA: Diagnosis not present

## 2022-10-18 DIAGNOSIS — Z743 Need for continuous supervision: Secondary | ICD-10-CM | POA: Diagnosis not present

## 2022-10-18 DIAGNOSIS — Z7401 Bed confinement status: Secondary | ICD-10-CM | POA: Diagnosis not present

## 2022-10-18 NOTE — ED Notes (Signed)
Report given to Jorene Guest, Therapist, sports at [facility].  States do not have transport available.  [phone]

## 2022-10-18 NOTE — ED Notes (Signed)
[facility] to pick up pt.

## 2022-10-18 NOTE — ED Notes (Signed)
Attempted to call [facility] x2

## 2022-10-22 ENCOUNTER — Telehealth: Payer: Medicare Other

## 2022-10-22 DIAGNOSIS — N1831 Chronic kidney disease, stage 3a: Secondary | ICD-10-CM

## 2022-10-22 DIAGNOSIS — Z8616 Personal history of COVID-19: Secondary | ICD-10-CM

## 2022-10-22 DIAGNOSIS — I5023 Acute on chronic systolic (congestive) heart failure: Secondary | ICD-10-CM | POA: Diagnosis not present

## 2022-10-22 DIAGNOSIS — K219 Gastro-esophageal reflux disease without esophagitis: Secondary | ICD-10-CM

## 2022-10-22 DIAGNOSIS — Z89422 Acquired absence of other left toe(s): Secondary | ICD-10-CM

## 2022-10-22 DIAGNOSIS — E039 Hypothyroidism, unspecified: Secondary | ICD-10-CM

## 2022-10-22 DIAGNOSIS — L97523 Non-pressure chronic ulcer of other part of left foot with necrosis of muscle: Secondary | ICD-10-CM | POA: Diagnosis not present

## 2022-10-22 DIAGNOSIS — G40909 Epilepsy, unspecified, not intractable, without status epilepticus: Secondary | ICD-10-CM

## 2022-10-22 DIAGNOSIS — I5032 Chronic diastolic (congestive) heart failure: Secondary | ICD-10-CM | POA: Diagnosis not present

## 2022-10-22 DIAGNOSIS — M549 Dorsalgia, unspecified: Secondary | ICD-10-CM

## 2022-10-22 DIAGNOSIS — Z89412 Acquired absence of left great toe: Secondary | ICD-10-CM

## 2022-10-22 DIAGNOSIS — R296 Repeated falls: Secondary | ICD-10-CM

## 2022-10-22 DIAGNOSIS — M6281 Muscle weakness (generalized): Secondary | ICD-10-CM | POA: Diagnosis not present

## 2022-10-22 DIAGNOSIS — G8929 Other chronic pain: Secondary | ICD-10-CM

## 2022-10-22 DIAGNOSIS — I4819 Other persistent atrial fibrillation: Secondary | ICD-10-CM

## 2022-10-22 DIAGNOSIS — R7881 Bacteremia: Secondary | ICD-10-CM | POA: Diagnosis not present

## 2022-10-22 DIAGNOSIS — R531 Weakness: Secondary | ICD-10-CM

## 2022-10-22 DIAGNOSIS — M1990 Unspecified osteoarthritis, unspecified site: Secondary | ICD-10-CM

## 2022-10-22 DIAGNOSIS — Z09 Encounter for follow-up examination after completed treatment for conditions other than malignant neoplasm: Secondary | ICD-10-CM | POA: Diagnosis not present

## 2022-10-22 DIAGNOSIS — K760 Fatty (change of) liver, not elsewhere classified: Secondary | ICD-10-CM

## 2022-10-22 DIAGNOSIS — Z8679 Personal history of other diseases of the circulatory system: Secondary | ICD-10-CM | POA: Diagnosis not present

## 2022-10-22 DIAGNOSIS — J45909 Unspecified asthma, uncomplicated: Secondary | ICD-10-CM

## 2022-10-22 DIAGNOSIS — R419 Unspecified symptoms and signs involving cognitive functions and awareness: Secondary | ICD-10-CM

## 2022-10-22 DIAGNOSIS — N390 Urinary tract infection, site not specified: Secondary | ICD-10-CM

## 2022-10-22 DIAGNOSIS — B9561 Methicillin susceptible Staphylococcus aureus infection as the cause of diseases classified elsewhere: Secondary | ICD-10-CM | POA: Diagnosis not present

## 2022-10-22 DIAGNOSIS — M19011 Primary osteoarthritis, right shoulder: Secondary | ICD-10-CM | POA: Diagnosis not present

## 2022-10-22 DIAGNOSIS — R262 Difficulty in walking, not elsewhere classified: Secondary | ICD-10-CM | POA: Diagnosis not present

## 2022-10-22 DIAGNOSIS — F418 Other specified anxiety disorders: Secondary | ICD-10-CM

## 2022-10-24 DIAGNOSIS — I4891 Unspecified atrial fibrillation: Secondary | ICD-10-CM | POA: Diagnosis not present

## 2022-10-24 DIAGNOSIS — M5136 Other intervertebral disc degeneration, lumbar region: Secondary | ICD-10-CM | POA: Diagnosis not present

## 2022-10-24 DIAGNOSIS — R2689 Other abnormalities of gait and mobility: Secondary | ICD-10-CM | POA: Diagnosis not present

## 2022-10-24 DIAGNOSIS — M064 Inflammatory polyarthropathy: Secondary | ICD-10-CM | POA: Diagnosis not present

## 2022-10-24 DIAGNOSIS — R296 Repeated falls: Secondary | ICD-10-CM | POA: Diagnosis not present

## 2022-10-24 DIAGNOSIS — I739 Peripheral vascular disease, unspecified: Secondary | ICD-10-CM | POA: Diagnosis not present

## 2022-10-24 DIAGNOSIS — Z09 Encounter for follow-up examination after completed treatment for conditions other than malignant neoplasm: Secondary | ICD-10-CM | POA: Diagnosis not present

## 2022-10-24 DIAGNOSIS — Z8709 Personal history of other diseases of the respiratory system: Secondary | ICD-10-CM | POA: Diagnosis not present

## 2022-10-24 DIAGNOSIS — I5032 Chronic diastolic (congestive) heart failure: Secondary | ICD-10-CM | POA: Diagnosis not present

## 2022-10-25 ENCOUNTER — Telehealth: Payer: Self-pay

## 2022-10-25 ENCOUNTER — Ambulatory Visit: Payer: Self-pay

## 2022-10-25 NOTE — Progress Notes (Addendum)
Care Management & Coordination Services Pharmacy Team  Reason for Encounter: Medication coordination and delivery  Attempted contact with [name] for multiple attempts . Unsuccessful outreach.  Patient may be in SNF    Recent office visits:  None since last CCM contact   Recent consult visits:  None since last CCM contact  Hospital visits:  Medication Reconciliation was completed by comparing discharge summary, patient's EMR and Pharmacy list, and upon discussion with patient.  Admitted to the hospital on 10/01/22 due to generalized weakness. Discharge date was 10/04/22. Discharged from [facility].    Medications that remain the same after Hospital Discharge:??  -All other medications will remain the same.    Medications: Outpatient Encounter Medications as of 10/25/2022  Medication Sig Note   acetaminophen (TYLENOL) 500 MG tablet Take 1,000 mg by mouth 3 (three) times daily.    amitriptyline (ELAVIL) 50 MG tablet TAKE TWO TABLETS BY MOUTH EVERYDAY AT BEDTIME    apixaban (ELIQUIS) 5 MG TABS tablet Take 1 tablet (5 mg total) by mouth 2 (two) times daily.    atorvastatin (LIPITOR) 20 MG tablet TAKE ONE TABLET BY MOUTH ONCE DAILY    B Complex Vitamins (B-COMPLEX/B-12 PO) Take 1 tablet by mouth daily.    cetirizine (ZYRTEC) 10 MG tablet Take 1 tablet (10 mg total) by mouth daily.    Cholecalciferol (VITAMIN D3) 25 MCG (1000 UT) CAPS Take 1 capsule (1,000 Units total) by mouth daily.    ciprofloxacin (CIPRO) 500 MG tablet SMARTSIG:1 Tablet(s) By Mouth Every 12 Hours    clotrimazole (LOTRIMIN) 1 % cream Apply 1 Application topically 2 (two) times daily as needed (rash).    diphenhydrAMINE (BENADRYL) 25 mg capsule Take 25 mg by mouth every 6 (six) hours as needed for allergies.    divalproex (DEPAKOTE ER) 500 MG 24 hr tablet TAKE ONE TABLET BY MOUTH EVERYDAY AT BEDTIME    EPINEPHrine 0.3 mg/0.3 mL IJ SOAJ injection Inject 0.3 mg into the muscle as needed for anaphylaxis. (bee  stings) (Patient not taking: Reported on 10/02/2022)    ferrous sulfate 325 (65 FE) MG EC tablet Take 325 mg by mouth daily with breakfast.    fluticasone (FLONASE) 50 MCG/ACT nasal spray Place 2 sprays into both nostrils daily.    Fremanezumab-vfrm (AJOVY) 225 MG/1.5ML SOAJ Inject 225 mg into the skin every 30 (thirty) days. 08/27/2022: Dose due today, per Patient   furosemide (LASIX) 20 MG tablet TAKE ONE TABLET BY MOUTH EVERY MORNING    gabapentin (NEURONTIN) 300 MG capsule TAKE ONE CAPSULE BY MOUTH EVERY MORNING and TAKE TWO CAPSULES BY MOUTH AT NOON and TAKE TWO CAPSULES BY MOUTH EVERY EVENING    HYDROcodone-acetaminophen (NORCO/VICODIN) 5-325 MG tablet Take 1 tablet by mouth every 6 (six) hours as needed for moderate pain.    levothyroxine (SYNTHROID) 112 MCG tablet Take 1 tablet (112 mcg total) by mouth daily before breakfast.    metFORMIN (GLUCOPHAGE) 500 MG tablet Take 500 mg by mouth daily.    methocarbamol (ROBAXIN) 500 MG tablet TAKE ONE TABLET BY MOUTH twice daily AS NEEDED FOR muscle SPASMS    metoprolol succinate (TOPROL-XL) 100 MG 24 hr tablet Take 1 tablet (100 mg total) by mouth at bedtime. Take with or immediately following a meal.    nitroGLYCERIN (NITROSTAT) 0.4 MG SL tablet Place 1 tablet (0.4 mg total) under the tongue every 5 (five) minutes as needed for chest pain. (Patient not taking: Reported on 10/02/2022)    nystatin (MYCOSTATIN) 100000 UNIT/ML  suspension Take 5 mLs (500,000 Units total) by mouth 4 (four) times daily.    ondansetron (ZOFRAN-ODT) 4 MG disintegrating tablet TAKE ONE TABLET BY MOUTH every EIGHT hours AS NEEDED    pantoprazole (PROTONIX) 40 MG tablet Take 40 mg by mouth every other day.    tamsulosin (FLOMAX) 0.4 MG CAPS capsule TAKE ONE CAPSULE BY MOUTH ONCE DAILY    tiZANidine (ZANAFLEX) 4 MG tablet TAKE ONE TABLET BY MOUTH every SIX hours AS NEEDED FOR acute HEADACHE    Ubrogepant (UBRELVY) 100 MG TABS Take 100 mg by mouth as needed (take 1 tablet at  onset of headache, may repeat in hours, max is 200 mg in 24 hours). (Patient taking differently: Take 100 mg by mouth as needed (Give 1 tablet by mouth every 12 hours as needed for headache).)    No facility-administered encounter medications on file as of 10/25/2022.   BP Readings from Last 3 Encounters:  10/18/22 119/74  10/05/22 111/64  10/04/22 103/71    Pulse Readings from Last 3 Encounters:  10/18/22 89  10/05/22 86  10/04/22 77    Lab Results  Component Value Date/Time   HGBA1C 5.6 07/13/2010 08:12 PM   HGBA1C  12/08/2008 04:33 PM    5.2 (NOTE)   The ADA recommends the following therapeutic goal for glycemic   control related to Hgb A1C measurement:   Goal of Therapy:   < 7.0% Hgb A1C   Reference: American Diabetes Association: Clinical Practice   Recommendations 2008, Diabetes Care,  2008, 31:(Suppl 1).   Lab Results  Component Value Date   CREATININE 0.96 10/17/2022   BUN 24 (H) 10/17/2022   GFR 60.10 09/26/2022   GFRNONAA >60 10/17/2022   GFRAA >60 12/05/2018   NA 136 10/17/2022   K 3.5 10/17/2022   CALCIUM 8.4 (L) 10/17/2022   CO2 26 10/17/2022     Last adherence delivery date:10/02/22      Patient is due for next adherence delivery on: unsuccessful outreach   Multiple attempts made to reach patient. Unsuccessful outreach. Will refill based off of last adherence fill.   This delivery to include: Adherence Packaging  30 Days  Gabapentin '300mg'$  - take 1 tablet breakfast, 2 tablets lunch,2 tablets evening meal Furosemide '20mg'$  - take 1 tablet daily breakfast  daily Pantoprazole '40mg'$  - take 1 tablet at breakfast Atorvastatin '20mg'$  - take 1 tablet at breakfast Vitamin D3 64mg - take 1 capsule breakfast Ferrous sulfate 324 mg - take 1 tablet at breakfast  daily  Metoprolol succinate '100mg'$  - take 1 tablet at bedtime  Levothyroxine 112 mcg - take 1 tablet at breakfast Amitriptyline '50mg'$  - take 2 tablets at bedtime Divalproex '500mg'$ - take 1 tablet bedtim Eliquis '5mg'$   - take 1 tablet breakfast, 1 table bedtime   Vials:  Methocarbamol '500mg'$ - take 1 tablet 2 times daily as needed  Tizanidine '4mg'$ - take 1 tablet every 6 hours for HA as needed Ubrelvy-'100mg'$  -use as needed Zofran '4mg'$ - take 1 tablet as needed for nausea    [facility].   VAvel Sensor CMA   Pharmacist addendum: Reviewed chart, removed metformin from list as this appears to be a mistake, pt has never taken metformin. We were not able to speak with patient so [facility] will attempt to verify medications with patient prior to delivery.  LCharlene Brooke PharmD, BCACP 10/26/22 12:02 PM

## 2022-10-25 NOTE — Patient Outreach (Signed)
  Care Coordination   10/25/2022 Name: Nicholas [name] MRN: [id number] DOB: [birth year]   Care Coordination Outreach Attempts:  An unsuccessful telephone outreach was attempted for a scheduled appointment today. Unable to reach patient or leave voice message due to voice mailbox being full.   Follow Up Plan:  Additional outreach attempts will be made to offer the patient care coordination information and services.   Encounter Outcome:  No Answer   Care Coordination Interventions:  No, not indicated    Quinn Plowman [facility] [address] [phone] direct line

## 2022-10-26 ENCOUNTER — Telehealth: Payer: Self-pay | Admitting: Orthopedic Surgery

## 2022-10-26 NOTE — Addendum Note (Signed)
Addended by: Charlton Haws on: 10/26/2022 12:03 PM   Modules accepted: Orders

## 2022-10-26 NOTE — Telephone Encounter (Signed)
Patient called. He would like to know if he could get braces for his legs. His call back number is [phone]

## 2022-10-29 NOTE — Telephone Encounter (Signed)
I called and lm on vm for pt to advise I am not sure what type of leg bracing he is wanting but we have not seen him in the office since 07/12/2022 and insurance would require a face to face visit to eval the need and supporting documentation to cover. We are happy to see him in the office at any time to call and make an appt for the first available appt with [facility] or [name]. And let me know if there isn anything else that I can help him with.

## 2022-10-30 ENCOUNTER — Telehealth: Payer: Self-pay

## 2022-10-30 ENCOUNTER — Ambulatory Visit (INDEPENDENT_AMBULATORY_CARE_PROVIDER_SITE_OTHER): Payer: 59 | Admitting: Family Medicine

## 2022-10-30 ENCOUNTER — Encounter: Payer: Self-pay | Admitting: Family Medicine

## 2022-10-30 VITALS — BP 112/64 | HR 82 | Temp 97.3°F | Ht 68.0 in

## 2022-10-30 DIAGNOSIS — I4891 Unspecified atrial fibrillation: Secondary | ICD-10-CM | POA: Diagnosis not present

## 2022-10-30 DIAGNOSIS — G40909 Epilepsy, unspecified, not intractable, without status epilepticus: Secondary | ICD-10-CM

## 2022-10-30 DIAGNOSIS — R296 Repeated falls: Secondary | ICD-10-CM | POA: Diagnosis not present

## 2022-10-30 DIAGNOSIS — G43109 Migraine with aura, not intractable, without status migrainosus: Secondary | ICD-10-CM

## 2022-10-30 DIAGNOSIS — G8929 Other chronic pain: Secondary | ICD-10-CM

## 2022-10-30 DIAGNOSIS — N1831 Chronic kidney disease, stage 3a: Secondary | ICD-10-CM | POA: Diagnosis not present

## 2022-10-30 DIAGNOSIS — Z89412 Acquired absence of left great toe: Secondary | ICD-10-CM

## 2022-10-30 DIAGNOSIS — R413 Other amnesia: Secondary | ICD-10-CM | POA: Diagnosis not present

## 2022-10-30 DIAGNOSIS — I5032 Chronic diastolic (congestive) heart failure: Secondary | ICD-10-CM

## 2022-10-30 DIAGNOSIS — K2100 Gastro-esophageal reflux disease with esophagitis, without bleeding: Secondary | ICD-10-CM

## 2022-10-30 DIAGNOSIS — B370 Candidal stomatitis: Secondary | ICD-10-CM

## 2022-10-30 DIAGNOSIS — R627 Adult failure to thrive: Secondary | ICD-10-CM | POA: Diagnosis not present

## 2022-10-30 DIAGNOSIS — R519 Headache, unspecified: Secondary | ICD-10-CM | POA: Diagnosis not present

## 2022-10-30 MED ORDER — AMITRIPTYLINE HCL 50 MG PO TABS: 50.0000 mg | ORAL_TABLET | Freq: Every day | ORAL | 1 refills | Status: DC

## 2022-10-30 NOTE — Progress Notes (Signed)
Care Management & Coordination Services Pharmacy Team  Reason for Encounter: Appointment Reminder  Contacted patient on 10/30/2022   Recent office visits:  None since last CCM contact   Recent consult visits:  None since last CCM contact   Hospital visits:  Medication Reconciliation was completed by comparing discharge summary, patient's EMR and Pharmacy list, and upon discussion with patient.   Admitted to the hospital on 10/01/22 due to generalized weakness. Discharge date was 10/04/22. Discharged from [facility].     Medications that remain the same after Hospital Discharge:??  -All other medications will remain the same  [facility] -Neck pain/cough-labs,xrays,discharged to SNF 10/17/2022-[facility] ED-altered mental status-Scans, EKG, discharged to SNF  Medications: Outpatient Encounter Medications as of 10/30/2022  Medication Sig Note   acetaminophen (TYLENOL) 500 MG tablet Take 1,000 mg by mouth 3 (three) times daily.    amitriptyline (ELAVIL) 50 MG tablet TAKE TWO TABLETS BY MOUTH EVERYDAY AT BEDTIME    apixaban (ELIQUIS) 5 MG TABS tablet Take 1 tablet (5 mg total) by mouth 2 (two) times daily.    atorvastatin (LIPITOR) 20 MG tablet TAKE ONE TABLET BY MOUTH ONCE DAILY    B Complex Vitamins (B-COMPLEX/B-12 PO) Take 1 tablet by mouth daily.    cetirizine (ZYRTEC) 10 MG tablet Take 1 tablet (10 mg total) by mouth daily.    Cholecalciferol (VITAMIN D3) 25 MCG (1000 UT) CAPS Take 1 capsule (1,000 Units total) by mouth daily.    ciprofloxacin (CIPRO) 500 MG tablet SMARTSIG:1 Tablet(s) By Mouth Every 12 Hours    clotrimazole (LOTRIMIN) 1 % cream Apply 1 Application topically 2 (two) times daily as needed (rash).    diphenhydrAMINE (BENADRYL) 25 mg capsule Take 25 mg by mouth every 6 (six) hours as needed for allergies.    divalproex (DEPAKOTE ER) 500 MG 24 hr tablet TAKE ONE TABLET BY MOUTH EVERYDAY AT BEDTIME    EPINEPHrine 0.3 mg/0.3 mL IJ SOAJ injection Inject 0.3 mg  into the muscle as needed for anaphylaxis. (bee stings) (Patient not taking: Reported on 10/02/2022)    ferrous sulfate 325 (65 FE) MG EC tablet Take 325 mg by mouth daily with breakfast.    fluticasone (FLONASE) 50 MCG/ACT nasal spray Place 2 sprays into both nostrils daily.    Fremanezumab-vfrm (AJOVY) 225 MG/1.5ML SOAJ Inject 225 mg into the skin every 30 (thirty) days. 08/27/2022: Dose due today, per Patient   furosemide (LASIX) 20 MG tablet TAKE ONE TABLET BY MOUTH EVERY MORNING    gabapentin (NEURONTIN) 300 MG capsule TAKE ONE CAPSULE BY MOUTH EVERY MORNING and TAKE TWO CAPSULES BY MOUTH AT NOON and TAKE TWO CAPSULES BY MOUTH EVERY EVENING    HYDROcodone-acetaminophen (NORCO/VICODIN) 5-325 MG tablet Take 1 tablet by mouth every 6 (six) hours as needed for moderate pain.    levothyroxine (SYNTHROID) 112 MCG tablet Take 1 tablet (112 mcg total) by mouth daily before breakfast.    methocarbamol (ROBAXIN) 500 MG tablet TAKE ONE TABLET BY MOUTH twice daily AS NEEDED FOR muscle SPASMS    metoprolol succinate (TOPROL-XL) 100 MG 24 hr tablet Take 1 tablet (100 mg total) by mouth at bedtime. Take with or immediately following a meal.    nitroGLYCERIN (NITROSTAT) 0.4 MG SL tablet Place 1 tablet (0.4 mg total) under the tongue every 5 (five) minutes as needed for chest pain. (Patient not taking: Reported on 10/02/2022)    nystatin (MYCOSTATIN) 100000 UNIT/ML suspension Take 5 mLs (500,000 Units total) by mouth 4 (four) times daily.  ondansetron (ZOFRAN-ODT) 4 MG disintegrating tablet TAKE ONE TABLET BY MOUTH every EIGHT hours AS NEEDED    pantoprazole (PROTONIX) 40 MG tablet Take 40 mg by mouth every other day.    tamsulosin (FLOMAX) 0.4 MG CAPS capsule TAKE ONE CAPSULE BY MOUTH ONCE DAILY    tiZANidine (ZANAFLEX) 4 MG tablet TAKE ONE TABLET BY MOUTH every SIX hours AS NEEDED FOR acute HEADACHE    Ubrogepant (UBRELVY) 100 MG TABS Take 100 mg by mouth as needed (take 1 tablet at onset of headache, may  repeat in hours, max is 200 mg in 24 hours). (Patient taking differently: Take 100 mg by mouth as needed (Give 1 tablet by mouth every 12 hours as needed for headache).)    No facility-administered encounter medications on file as of 10/30/2022.   Lab Results  Component Value Date/Time   HGBA1C 5.6 07/13/2010 08:12 PM   HGBA1C  12/08/2008 04:33 PM    5.2 (NOTE)   The ADA recommends the following therapeutic goal for glycemic   control related to Hgb A1C measurement:   Goal of Therapy:   < 7.0% Hgb A1C   Reference: American Diabetes Association: Clinical Practice   Recommendations 2008, Diabetes Care,  2008, 31:(Suppl 1).   MICROALBUR <0.7 02/20/2022 03:24 PM    BP Readings from Last 3 Encounters:  10/18/22 119/74  10/05/22 111/64  10/04/22 103/71    Unsuccessful attempt to reach patient. Left patient message reminding patient of appointment.  Mailbox Full  Patient contacted to confirm telephone appointment with [name],   PharmD, on 11/01/22 at 10:00.  Have you seen any other providers since your last visit with PCP? Yes- multiple ED visits, hospital stays, SNF,    (PCP  10/30/2022)    Star Rating Drugs:  Medication:  Last Fill: Day Supply Atorvastatin '20mg'$  09/28/22 30 [facility]    Care Gaps: Annual wellness visit in last year? Yes  If Diabetic: Last eye exam / retinopathy screening: never done Last diabetic foot exam: never done   [name], PharmD notified  [name], [place] Assistant [phone]

## 2022-10-30 NOTE — Progress Notes (Signed)
Patient ID: DIARRA KOS, male    DOB: [birth year], 72 y.o.   MRN: [id number]  This visit was conducted in person.  BP 112/64   Pulse 82   Temp (!) 97.3 F (36.3 C) (Temporal)   Ht '5\' 8"'$  (1.727 m)   SpO2 96%   BMI 33.45 kg/m    CC: ER f/u visit Subjective:   HPI: ESTEVAN KERSH is a 72 y.o. male presenting on 10/30/2022 for Hospitalization Follow-up (Seen 10/01/22 at [facility] ED, dx generalized weakness. Then seen 10/05/22 at [facility] ED, dx influenza A. Then seen 10/17/22 at [facility] ED, dx altered mental status. Pt accompanied by son, [name]. )   See prior note for details.  Recent hospitalizations 07/2022 for COVID pneumonia with fall, then 08/2022 hypotension from MSSA bacteremia. Came home from [facility] 09/22/2022.  Subsequent ER visit 10/01/2022 for FTT and gen weakness, discharged to [facility].  Subsequent ER visit 10/05/2022 for Influenza A however outside of window for tamiflu.  Subsequent ER visit 10/17/2022 for AMS associated with incontinence (unsure if bowel or bladder) and possible chest pain. Head CT unrevealing. CXR normal. EKG and troponins were normal.   Hospital records reviewed. Med rec performed.   Recently treated for influenza and thrush (oral nystatin). Continues nystatin treatment.   Came home from rehab on Wednesday to his apartment. Feels he's doing well.   Recent accident at home preparing pot of chicken - dropped it while trying to get it into oven.  He is not interested in assisted living at this time.   [facility] set up - no one from [facility] has come out to the house yet.  Best # to contact pt cell [phone], then call son [name] cell [phone].  Son lives [place] min away [place]).  Requests Rx for knee brace but unclear what type he needs - suggested f/u with ortho Dr [name].      Relevant past medical, surgical, family and social history reviewed and updated as indicated. Interim medical history since our last visit reviewed. Allergies and medications  reviewed and updated. Outpatient Medications Prior to Visit  Medication Sig Dispense Refill   acetaminophen (TYLENOL) 500 MG tablet Take 1,000 mg by mouth 3 (three) times daily.     apixaban (ELIQUIS) 5 MG TABS tablet Take 1 tablet (5 mg total) by mouth 2 (two) times daily. 60 tablet 3   atorvastatin (LIPITOR) 20 MG tablet TAKE ONE TABLET BY MOUTH ONCE DAILY 90 tablet 3   B Complex Vitamins (B-COMPLEX/B-12 PO) Take 1 tablet by mouth daily.     cetirizine (ZYRTEC) 10 MG tablet Take 1 tablet (10 mg total) by mouth daily. 30 tablet 0   Cholecalciferol (VITAMIN D3) 25 MCG (1000 UT) CAPS Take 1 capsule (1,000 Units total) by mouth daily. 90 capsule 1   divalproex (DEPAKOTE ER) 500 MG 24 hr tablet TAKE ONE TABLET BY MOUTH EVERYDAY AT BEDTIME 30 tablet 5   EPINEPHrine 0.3 mg/0.3 mL IJ SOAJ injection Inject 0.3 mg into the muscle as needed for anaphylaxis. (bee stings) 2 each 1   ferrous sulfate 325 (65 FE) MG EC tablet Take 325 mg by mouth daily with breakfast.     fluticasone (FLONASE) 50 MCG/ACT nasal spray Place 2 sprays into both nostrils daily. 16 g 0   Fremanezumab-vfrm (AJOVY) 225 MG/1.5ML SOAJ Inject 225 mg into the skin every 30 (thirty) days. (Patient not taking: Reported on 11/01/2022) 1.68 mL 11   furosemide (LASIX) 20 MG  tablet TAKE ONE TABLET BY MOUTH EVERY MORNING 90 tablet 1   gabapentin (NEURONTIN) 300 MG capsule TAKE ONE CAPSULE BY MOUTH EVERY MORNING and TAKE TWO CAPSULES BY MOUTH AT NOON and TAKE TWO CAPSULES BY MOUTH EVERY EVENING 450 capsule 1   HYDROcodone-acetaminophen (NORCO/VICODIN) 5-325 MG tablet Take 1 tablet by mouth every 6 (six) hours as needed for moderate pain.     levothyroxine (SYNTHROID) 112 MCG tablet Take 1 tablet (112 mcg total) by mouth daily before breakfast. 90 tablet 3   metoprolol succinate (TOPROL-XL) 100 MG 24 hr tablet Take 1 tablet (100 mg total) by mouth at bedtime. Take with or immediately following a meal.     nitroGLYCERIN (NITROSTAT) 0.4 MG SL tablet  Place 1 tablet (0.4 mg total) under the tongue every 5 (five) minutes as needed for chest pain. 20 tablet 3   ondansetron (ZOFRAN-ODT) 4 MG disintegrating tablet TAKE ONE TABLET BY MOUTH every EIGHT hours AS NEEDED 20 tablet 3   pantoprazole (PROTONIX) 40 MG tablet Take 40 mg by mouth every other day.     tamsulosin (FLOMAX) 0.4 MG CAPS capsule TAKE ONE CAPSULE BY MOUTH ONCE DAILY 30 capsule 0   tiZANidine (ZANAFLEX) 4 MG tablet TAKE ONE TABLET BY MOUTH every SIX hours AS NEEDED FOR acute HEADACHE 20 tablet 1   Ubrogepant (UBRELVY) 100 MG TABS Take 100 mg by mouth as needed (take 1 tablet at onset of headache, may repeat in hours, max is 200 mg in 24 hours). (Patient taking differently: Take 100 mg by mouth as needed (Give 1 tablet by mouth every 12 hours as needed for headache).) 12 tablet 11   amitriptyline (ELAVIL) 50 MG tablet TAKE TWO TABLETS BY MOUTH EVERYDAY AT BEDTIME 180 tablet 1   diphenhydrAMINE (BENADRYL) 25 mg capsule Take 25 mg by mouth every 6 (six) hours as needed for allergies.     methocarbamol (ROBAXIN) 500 MG tablet TAKE ONE TABLET BY MOUTH twice daily AS NEEDED FOR muscle SPASMS 20 tablet 2   clotrimazole (LOTRIMIN) 1 % cream Apply 1 Application topically 2 (two) times daily as needed (rash). (Patient not taking: Reported on 10/30/2022)     nystatin (MYCOSTATIN) 100000 UNIT/ML suspension Take 5 mLs (500,000 Units total) by mouth 4 (four) times daily. 200 mL 0   ciprofloxacin (CIPRO) 500 MG tablet SMARTSIG:1 Tablet(s) By Mouth Every 12 Hours     No facility-administered medications prior to visit.     Per HPI unless specifically indicated in ROS section below Review of Systems  Objective:  BP 112/64   Pulse 82   Temp (!) 97.3 F (36.3 C) (Temporal)   Ht '5\' 8"'$  (1.727 m)   SpO2 96%   BMI 33.45 kg/m   Wt Readings from Last 3 Encounters:  10/05/22 220 lb 0.3 oz (99.8 kg)  10/01/22 220 lb (99.8 kg)  08/27/22 249 lb 9 oz (113.2 kg)      Physical Exam Vitals and nursing  note reviewed.  Constitutional:      Appearance: Normal appearance. He is not ill-appearing.     Comments: Sitting in wheelchair with RLE in chronic fixed extension after knee fusion  HENT:     Head: Normocephalic and atraumatic.     Mouth/Throat:     Mouth: Mucous membranes are moist.     Pharynx: Oropharynx is clear. No oropharyngeal exudate or posterior oropharyngeal erythema.     Comments: No signs of ongoing thrush Eyes:     Extraocular Movements: Extraocular movements intact.  Pupils: Pupils are equal, round, and reactive to light.  Cardiovascular:     Rate and Rhythm: Normal rate and regular rhythm.     Pulses: Normal pulses.     Heart sounds: Normal heart sounds. No murmur heard. Pulmonary:     Effort: Pulmonary effort is normal. No respiratory distress.     Breath sounds: Normal breath sounds. No wheezing, rhonchi or rales.  Musculoskeletal:     Right lower leg: No edema.     Left lower leg: No edema.  Skin:    General: Skin is warm and dry.     Findings: No rash.  Neurological:     Mental Status: He is alert.  Psychiatric:        Mood and Affect: Mood normal.        Behavior: Behavior normal.       Results for orders placed or performed during the hospital encounter of 10/17/22  Resp panel by RT-PCR (RSV, Flu A&B, Covid) Anterior Nasal Swab   Specimen: Anterior Nasal Swab  Result Value Ref Range   SARS Coronavirus 2 by RT PCR NEGATIVE NEGATIVE   Influenza A by PCR POSITIVE (A) NEGATIVE   Influenza B by PCR NEGATIVE NEGATIVE   Resp Syncytial Virus by PCR NEGATIVE NEGATIVE  Comprehensive metabolic panel  Result Value Ref Range   Sodium 136 135 - 145 mmol/L   Potassium 3.5 3.5 - 5.1 mmol/L   Chloride 98 98 - 111 mmol/L   CO2 26 22 - 32 mmol/L   Glucose, Bld 97 70 - 99 mg/dL   BUN 24 (H) 8 - 23 mg/dL   Creatinine, Ser 0.96 0.61 - 1.24 mg/dL   Calcium 8.4 (L) 8.9 - 10.3 mg/dL   Total Protein 7.0 6.5 - 8.1 g/dL   Albumin 3.0 (L) 3.5 - 5.0 g/dL   AST 20 15  - 41 U/L   ALT 10 0 - 44 U/L   Alkaline Phosphatase 84 38 - 126 U/L   Total Bilirubin 1.2 0.3 - 1.2 mg/dL   GFR, Estimated >60 >60 mL/min   Anion gap 12 5 - 15  CBC  Result Value Ref Range   WBC 12.5 (H) 4.0 - 10.5 K/uL   RBC 4.16 (L) 4.22 - 5.81 MIL/uL   Hemoglobin 13.0 13.0 - 17.0 g/dL   HCT 41.3 39.0 - 52.0 %   MCV 99.3 80.0 - 100.0 fL   MCH 31.3 26.0 - 34.0 pg   MCHC 31.5 30.0 - 36.0 g/dL   RDW 14.6 11.5 - 15.5 %   Platelets 502 (H) 150 - 400 K/uL   nRBC 0.0 0.0 - 0.2 %  Protime-INR - (order if patient is taking Coumadin / Warfarin)  Result Value Ref Range   Prothrombin Time 20.6 (H) 11.4 - 15.2 seconds   INR 1.8 (H) 0.8 - 1.2  Troponin I (High Sensitivity)  Result Value Ref Range   Troponin I (High Sensitivity) 4 <18 ng/L   Lab Results  Component Value Date   HGBA1C 5.6 07/13/2010       10/30/2022    3:12 PM 09/10/2022    3:07 PM 02/28/2022   10:38 AM 02/19/2022   12:22 PM 02/14/2021    2:44 PM  Depression screen PHQ 2/9  Decreased Interest 1 0 0 0 0  Down, Depressed, Hopeless 0 0 0 0 0  PHQ - 2 Score 1 0 0 0 0  Altered sleeping '1  1 1   '$ Tired, decreased energy 1  1 1   Change in appetite '1  1 1   '$ Feeling bad or failure about yourself  0  0 0   Trouble concentrating 0  0 0   Moving slowly or fidgety/restless 1  0 0   Suicidal thoughts 0  0 0   PHQ-9 Score '5  3 3   '$ Difficult doing work/chores   Not difficult at all Somewhat difficult        10/30/2022    3:12 PM 02/19/2022   12:22 PM  GAD 7 : Generalized Anxiety Score  Nervous, Anxious, on Edge 0 0  Control/stop worrying 0 0  Worry too much - different things 0 0  Trouble relaxing 1 0  Restless 0 0  Easily annoyed or irritable 0 0  Afraid - awful might happen 0 0  Total GAD 7 Score 1 0  Anxiety Difficulty  Not difficult at all   Assessment & Plan:   Problem List Items Addressed This Visit     Migraine    See above, continue neurology f/u.       Relevant Medications   amitriptyline (ELAVIL) 50 MG  tablet   GERD (gastroesophageal reflux disease)    Stable period on pantoprazole QOD      CKD (chronic kidney disease) stage 3, GFR 30-59 ml/min (HCC)    Latest GFR >60      Chronic diastolic CHF (congestive heart failure) (HCC)    Seems euvolemic today.       Status post amputation of great toe, left (HCC)   Chronic headache    Reviewed headache regimen - discussed starting abortive treatment with ubrelvy then tizanidine PRN. Stop robaxin. He's not been taking ajovy, last saw neurology 07/2022 with f/u planned 01/2022.  Continue gabapentin, drop amitriptyline to '50mg'$  nightly (in an effort to minimize anticholinergic load).       Relevant Medications   amitriptyline (ELAVIL) 50 MG tablet   Memory difficulty    Med rec performed, working on minimizing medications that could affect cognition - stop benadryl, stop robaxin, decrease amitriptyline to '50mg'$  nightly.  Consider updated MMSE next visit.       Recurrent falls    Pending HHPT eval      Oral thrush    No evidence of ongoing thrush at this time.       Seizure disorder ([place])    On gabapentin low dose and depakote      Atrial fibrillation with RVR ([place])    He continues eliquis after episode of afib while acutely ill (hospitalization 08/2022)      FTT (failure to thrive) in adult - Primary    Multiple hospitalizations then ER visits over the past 2 months. Most recent stay at SNF, came home 10/24/2022. Concern over ability to stay at home however pt is not currently interested in placement and has ability to decide for himself. Will start with HHPT with close f/u - RTC 4-6 wks f/u visit.  [facility] commencement pending - will reach out to them.  Med rec performed, discussed minimizing medications that could affect cognition as per below  Best # to contact pt cell [phone], then call son [name] cell [phone].         Meds ordered this encounter  Medications   amitriptyline (ELAVIL) 50 MG tablet     Sig: Take 1 tablet (50 mg total) by mouth at bedtime.    Dispense:  90 tablet    Refill:  1  Note new dose    No orders of the defined types were placed in this encounter.   Patient Instructions  For headache treatment - I want you to start with Ubrelvy then if ineffective may take Zanaflex (tizanidine) muscle relaxant. Otherwise don't use Zanaflex.  Decrease amitriptyline to '50mg'$  nightly. We will see if this helps memory at all.  Stop Robaxin (methocarbamol), stop benadryl.  Continue other medicines as up to now.  Good to see you today.  We will touch base with [facility] for an update on HH start.   Follow up plan: Return in about 4 weeks (around 11/27/2022), or if symptoms worsen or fail to improve, for follow up visit.  [name], MD

## 2022-10-30 NOTE — Patient Instructions (Addendum)
For headache treatment - I want you to start with Ubrelvy then if ineffective may take Zanaflex (tizanidine) muscle relaxant. Otherwise don't use Zanaflex.  Decrease amitriptyline to '50mg'$  nightly. We will see if this helps memory at all.  Stop Robaxin (methocarbamol), stop benadryl.  Continue other medicines as up to now.  Good to see you today.  We will touch base with [facility] for an update on HH start.

## 2022-10-31 ENCOUNTER — Telehealth: Payer: Self-pay | Admitting: Family Medicine

## 2022-10-31 NOTE — Telephone Encounter (Signed)
Tonya from [facility] called over and wanted to let Dr. Darnell Level know that patient skilled nursing will start tomorrow 11/01/2022. Thank you!

## 2022-10-31 NOTE — Telephone Encounter (Signed)
Noted! Thank you

## 2022-11-01 ENCOUNTER — Telehealth: Payer: Self-pay | Admitting: Pharmacist

## 2022-11-01 ENCOUNTER — Encounter: Payer: Medicare Other | Admitting: Pharmacist

## 2022-11-01 ENCOUNTER — Encounter: Payer: Self-pay | Admitting: Family Medicine

## 2022-11-01 DIAGNOSIS — R627 Adult failure to thrive: Secondary | ICD-10-CM | POA: Insufficient documentation

## 2022-11-01 NOTE — Assessment & Plan Note (Signed)
Seems euvolemic today.  

## 2022-11-01 NOTE — Assessment & Plan Note (Signed)
On gabapentin low dose and depakote

## 2022-11-01 NOTE — Assessment & Plan Note (Signed)
Stable period on pantoprazole QOD

## 2022-11-01 NOTE — Assessment & Plan Note (Signed)
No evidence of ongoing thrush at this time.

## 2022-11-01 NOTE — Assessment & Plan Note (Signed)
See above, continue neurology f/u.

## 2022-11-01 NOTE — Assessment & Plan Note (Signed)
He continues eliquis after episode of afib while acutely ill (hospitalization 08/2022)

## 2022-11-01 NOTE — Assessment & Plan Note (Addendum)
Multiple hospitalizations then ER visits over the past 2 months. Most recent stay at SNF, came home 10/24/2022. Concern over ability to stay at home however pt is not currently interested in placement and has ability to decide for himself. Will start with HHPT with close f/u - RTC 4-6 wks f/u visit.  [facility] commencement pending - will reach out to them.  Med rec performed, discussed minimizing medications that could affect cognition as per below  Best # to contact pt cell [phone], then call son [name] cell [phone].

## 2022-11-01 NOTE — Assessment & Plan Note (Signed)
Pending HHPT eval

## 2022-11-01 NOTE — Assessment & Plan Note (Deleted)
This is resolved

## 2022-11-01 NOTE — Assessment & Plan Note (Addendum)
Latest GFR >60

## 2022-11-01 NOTE — Assessment & Plan Note (Addendum)
Med rec performed, working on minimizing medications that could affect cognition - stop benadryl, stop robaxin, decrease amitriptyline to '50mg'$  nightly.  Consider updated MMSE next visit.

## 2022-11-01 NOTE — Telephone Encounter (Signed)
Care Management & Coordination Services Outreach Note  11/01/2022 Name: Nicholas [name] MRN: [id number] DOB: June 11, 1951  Referred by: [name], MD  Patient had a phone appointment scheduled with clinical pharmacist today.  An unsuccessful telephone outreach was attempted today. The patient was referred to the pharmacist for assistance with medications, care management and care coordination.   Patient will NOT be penalized in any way for missing a Care Management & Coordination Services appointment. The no-show fee does not apply.  If possible, a message was left to return call to: [phone] or to [facility].   Per chart review and discussion with pharmacy, Ajovy was prescribed in Oct 2023 per neurology but has not been filled due to insurance denial:   "Ajovy autoinjector is denied because it is not on your plan's Drug List (formulary). Medication authorization requires the following: (1) You need to try two (2) of these covered drugs:   (a) Aimovig*. (b) Emgality auto-injector solution*. (c) Nurtec ODT*.  (2) OR your doctor needs to give [place] specific medical reasons why two (2) of the covered drug(s) are not appropriate for you."  Pt has Neurology appt upcoming 11/20/22, will route to neurology as update.    [name], PharmD, BCACP Clinical Pharmacist [facility] [phone]

## 2022-11-01 NOTE — Assessment & Plan Note (Addendum)
Reviewed headache regimen - discussed starting abortive treatment with ubrelvy then tizanidine PRN. Stop robaxin. He's not been taking ajovy, last saw neurology 07/2022 with f/u planned 01/2022.  Continue gabapentin, drop amitriptyline to '50mg'$  nightly (in an effort to minimize anticholinergic load).

## 2022-11-01 NOTE — Progress Notes (Unsigned)
Care Management & Coordination Services Pharmacy Note  11/01/2022 Name:  Nicholas [name] MRN:  [id number] DOB:  [birth year]  Summary: ***  Recommendations/Changes made from today's visit: ***  Follow up plan: -Health Concierge will call patient *** -Pharmacist follow up televisit scheduled for *** -Neurology appt 11/20/22    Subjective: Nicholas [name] is an 72 y.o. year old male who is a primary patient of [name], MD.  The care coordination team was consulted for assistance with disease management and care coordination needs.    Engaged with patient by telephone for follow up visit.  Recent office visits: 10/30/22 Dr Danise Mina OV: hospital f/u - not interested in ALF. [place] set up. Decrease amitriptyline to 50 mg HS. Stop Robaxin and Benadryl.   Recent consult visits: [facility] visits: -08/12/22 - 08/15/22 Admission [facility]): covid pneumonia. AKI and rhabdo.  -08/16/22 - 08/25/22 Admission [facility]): MSSA bacteremia. Afib w/ RVR, started Eliquis. -Came home from [facility] 09/22/2022.  -Subsequent ER visit 10/01/2022 for FTT and gen weakness, discharged to [facility].  -Subsequent ER visit 10/05/2022 for Influenza A however outside of window for tamiflu.  -Subsequent ER visit 10/17/2022 for AMS associated with incontinence (unsure if bowel or bladder) and possible chest pain. Head CT unrevealing. CXR normal. EKG and troponins were normal. Possible seizure.   Objective:  Lab Results  Component Value Date   CREATININE 0.96 10/17/2022   BUN 24 (H) 10/17/2022   GFR 60.10 09/26/2022   EGFR 55 (L) 01/16/2022   GFRNONAA >60 10/17/2022   GFRAA >60 12/05/2018   NA 136 10/17/2022   K 3.5 10/17/2022   CALCIUM 8.4 (L) 10/17/2022   CO2 26 10/17/2022   GLUCOSE 97 10/17/2022    Lab Results  Component Value Date/Time   HGBA1C 5.6 07/13/2010 08:12 PM   HGBA1C  12/08/2008 04:33 PM    5.2 (NOTE)   The ADA recommends the following therapeutic goal for glycemic   control  related to Hgb A1C measurement:   Goal of Therapy:   < 7.0% Hgb A1C   Reference: American Diabetes Association: Clinical Practice   Recommendations 2008, Diabetes Care,  2008, 31:(Suppl 1).   GFR 60.10 09/26/2022 09:02 AM   GFR 48.17 (L) 02/19/2022 12:19 PM   MICROALBUR <0.7 02/20/2022 03:24 PM    Last diabetic Eye exam: No results found for: "HMDIABEYEEXA"  Last diabetic Foot exam: No results found for: "HMDIABFOOTEX"   Lab Results  Component Value Date   CHOL 117 02/19/2022   HDL 40.60 02/19/2022   LDLCALC 49 02/19/2022   LDLDIRECT 60.0 12/11/2019   TRIG 137.0 02/19/2022   CHOLHDL 3 02/19/2022       Latest Ref Rng & Units 10/17/2022    4:19 PM 10/05/2022    1:30 AM 10/01/2022    8:56 PM  Hepatic Function  Total Protein 6.5 - 8.1 g/dL 7.0  6.2  7.5   Albumin 3.5 - 5.0 g/dL 3.0  2.3  3.0   AST 15 - 41 U/L '20  27  31   '$ ALT 0 - 44 U/L '10  27  31   '$ Alk Phosphatase 38 - 126 U/L 84  83  86   Total Bilirubin 0.3 - 1.2 mg/dL 1.2  1.0  2.1     Lab Results  Component Value Date/Time   TSH 3.139 10/01/2022 10:54 PM   TSH 1.500 08/12/2022 10:56 AM   TSH 6.40 (H) 02/19/2022 12:19 PM   TSH 0.40 07/04/2021 11:08 AM  FREET4 1.09 10/01/2022 10:54 PM   FREET4 0.96 08/12/2022 10:56 AM       Latest Ref Rng & Units 10/17/2022    4:19 PM 10/05/2022    1:30 AM 10/01/2022    8:56 PM  CBC  WBC 4.0 - 10.5 K/uL 12.5  9.6  16.4   Hemoglobin 13.0 - 17.0 g/dL 13.0  10.9  12.1   Hematocrit 39.0 - 52.0 % 41.3  33.9  37.8   Platelets 150 - 400 K/uL 502  188  197     Lab Results  Component Value Date/Time   VD25OH 48.88 02/19/2022 12:19 PM   VD25OH 40.71 07/04/2021 11:08 AM   VITAMINB12 810 09/26/2022 09:02 AM   HWTUUEKC00 349 02/19/2022 12:19 PM    Clinical ASCVD: No  The ASCVD Risk score (Arnett DK, et al., 2019) failed to calculate for the following reasons:   The patient has a prior MI or stroke diagnosis    CHA2DS2/VAS Stroke Risk Points  Current as of about an hour ago     4 >=  2 Points: High Risk  1 - 1.99 Points: Medium Risk  0 Points: Low Risk    Last Change:       Points Metrics  1 Has Congestive Heart Failure:  Yes    Current as of about an hour ago  1 Has Vascular Disease:  Yes    Current as of about an hour ago  0 Has Hypertension:  No    Current as of about an hour ago  1 Age:  62    Current as of about an hour ago  1 Has Diabetes:  Yes    Current as of about an hour ago  0 Had Stroke:  No  Had TIA:  No  Had Thromboembolism:  No    Current as of about an hour ago  0 Male:  No    Current as of about an hour ago         10/30/2022    3:12 PM 09/10/2022    3:07 PM 02/28/2022   10:38 AM  Depression screen PHQ 2/9  Decreased Interest 1 0 0  Down, Depressed, Hopeless 0 0 0  PHQ - 2 Score 1 0 0  Altered sleeping 1  1  Tired, decreased energy 1  1  Change in appetite 1  1  Feeling bad or failure about yourself  0  0  Trouble concentrating 0  0  Moving slowly or fidgety/restless 1  0  Suicidal thoughts 0  0  PHQ-9 Score 5  3  Difficult doing work/chores   Not difficult at all       10/30/2022    3:12 PM 02/19/2022   12:22 PM  GAD 7 : Generalized Anxiety Score  Nervous, Anxious, on Edge 0 0  Control/stop worrying 0 0  Worry too much - different things 0 0  Trouble relaxing 1 0  Restless 0 0  Easily annoyed or irritable 0 0  Afraid - awful might happen 0 0  Total GAD 7 Score 1 0  Anxiety Difficulty  Not difficult at all     Social History   Tobacco Use  Smoking Status Never   Passive exposure: Past  Smokeless Tobacco Never   BP Readings from Last 3 Encounters:  10/30/22 112/64  10/18/22 119/74  10/05/22 111/64   Pulse Readings from Last 3 Encounters:  10/30/22 82  10/18/22 89  10/05/22 86   Wt Readings from  Last 3 Encounters:  10/05/22 220 lb 0.3 oz (99.8 kg)  10/01/22 220 lb (99.8 kg)  08/27/22 249 lb 9 oz (113.2 kg)   BMI Readings from Last 3 Encounters:  10/30/22 33.45 kg/m  10/05/22 33.45 kg/m  10/01/22  33.45 kg/m    Allergies  Allergen Reactions   Peanut-Containing Drug Products Anaphylaxis and Dermatitis   Yellow Jacket Venom [Bee Venom] Anaphylaxis and Other (See Comments)    Respiratory Distress   Celebrex [Celecoxib] Other (See Comments)    BLACK STOOL?MELENA?   Percocet [Oxycodone-Acetaminophen] Hives, Swelling and Other (See Comments)   Latex Rash   Nsaids Rash    Medications Reviewed Today     Reviewed by Brenton Grills, [place] (Certified Medical Assistant) on 10/30/22 at 1501  Med List Status: <None>   Medication Order Taking? Sig Documenting Provider Last Dose Status Informant  acetaminophen (TYLENOL) 500 MG tablet [id number]  Take 1,000 mg by mouth 3 (three) times daily. [provider]  Active Self  amitriptyline (ELAVIL) 50 MG tablet [id number]  TAKE TWO TABLETS BY MOUTH EVERYDAY AT BEDTIME [name], MD  Active   apixaban (ELIQUIS) 5 MG TABS tablet [id number]  Take 1 tablet (5 mg total) by mouth 2 (two) times daily. [name], MD  Active   atorvastatin (LIPITOR) 20 MG tablet [id number]  TAKE ONE TABLET BY MOUTH ONCE DAILY [name], MD  Active Self  B Complex Vitamins (B-COMPLEX/B-12 PO) [id number]  Take 1 tablet by mouth daily. [provider]  Active Self  cetirizine (ZYRTEC) 10 MG tablet [id number]  Take 1 tablet (10 mg total) by mouth daily. Michela Pitcher, NP  Active Self  Cholecalciferol (VITAMIN D3) 25 MCG (1000 UT) CAPS [id number]  Take 1 capsule (1,000 Units total) by mouth daily. [name], MD  Active Self  ciprofloxacin (CIPRO) 500 MG tablet [id number]  SMARTSIG:1 Tablet(s) By Mouth Every 12 Hours [provider]  Active   clotrimazole (LOTRIMIN) 1 % cream [id number]  Apply 1 Application topically 2 (two) times daily as needed (rash). Jennye Boroughs, MD  Active   diphenhydrAMINE (BENADRYL) 25 mg capsule [id number]  Take 25 mg by mouth every 6 (six) hours as needed for allergies. [provider]  Active  Nursing Home Medication Administration Guide (MAG)  divalproex (DEPAKOTE ER) 500 MG 24 hr tablet [id number]  TAKE ONE TABLET BY MOUTH EVERYDAY AT BEDTIME Suzzanne Cloud, NP  Active Self  EPINEPHrine 0.3 mg/0.3 mL IJ SOAJ injection [id number]  Inject 0.3 mg into the muscle as needed for anaphylaxis. (bee stings)  Patient not taking: Reported on 10/02/2022   [name], MD  Active Self  ferrous sulfate 325 (65 FE) MG EC tablet [id number]  Take 325 mg by mouth daily with breakfast. [provider]  Active Self  fluticasone (FLONASE) 50 MCG/ACT nasal spray [id number]  Place 2 sprays into both nostrils daily. Michela Pitcher, NP  Active Self  Fremanezumab-vfrm (AJOVY) 225 MG/1.5ML Darden Palmer [id number]  Inject 225 mg into the skin every 30 (thirty) days. Suzzanne Cloud, NP  Active Self           Med Note Delice Bison Aug 27, 2022 10:32 AM) Dose due today, per Patient  furosemide (LASIX) 20 MG tablet [id number]  TAKE ONE TABLET BY MOUTH EVERY MORNING [name], MD  Active Self  gabapentin (NEURONTIN) 300 MG capsule [id number]  TAKE ONE CAPSULE BY MOUTH EVERY MORNING and TAKE TWO CAPSULES BY MOUTH AT NOON and TAKE TWO  CAPSULES BY MOUTH EVERY Recardo Evangelist, MD  Active Self  HYDROcodone-acetaminophen (NORCO/VICODIN) 5-325 MG tablet [id number]  Take 1 tablet by mouth every 6 (six) hours as needed for moderate pain. [provider]  Active Nursing Home Medication Administration Guide (MAG)  levothyroxine (SYNTHROID) 112 MCG tablet [id number]  Take 1 tablet (112 mcg total) by mouth daily before breakfast. [name], MD  Active Self  methocarbamol (ROBAXIN) 500 MG tablet [id number]  TAKE ONE TABLET BY MOUTH twice daily AS NEEDED FOR muscle SPASMS Newt Minion, MD  Active Self  metoprolol succinate (TOPROL-XL) 100 MG 24 hr tablet [id number]  Take 1 tablet (100 mg total) by mouth at bedtime. Take with or immediately following a meal. Jennye Boroughs, MD  Active    nitroGLYCERIN (NITROSTAT) 0.4 MG SL tablet [id number]  Place 1 tablet (0.4 mg total) under the tongue every 5 (five) minutes as needed for chest pain.  Patient not taking: Reported on 10/02/2022   [name], MD  Active Self  nystatin (MYCOSTATIN) 100000 UNIT/ML suspension [id number]  Take 5 mLs (500,000 Units total) by mouth 4 (four) times daily. [name], MD  Active   ondansetron (ZOFRAN-ODT) 4 MG disintegrating tablet [id number]  TAKE ONE TABLET BY MOUTH every EIGHT hours AS NEEDED Suzzanne Cloud, NP  Active   pantoprazole (PROTONIX) 40 MG tablet [id number]  Take 40 mg by mouth every other day. [name], MD  Active Self  tamsulosin (FLOMAX) 0.4 MG CAPS capsule [id number]  TAKE ONE CAPSULE BY MOUTH ONCE DAILY [name], MD  Active   tiZANidine (ZANAFLEX) 4 MG tablet [id number]  TAKE ONE TABLET BY MOUTH every SIX hours AS NEEDED FOR acute HEADACHE Suzzanne Cloud, NP  Active   Ubrogepant (UBRELVY) 100 MG TABS [id number]  Take 100 mg by mouth as needed (take 1 tablet at onset of headache, may repeat in hours, max is 200 mg in 24 hours).  Patient taking differently: Take 100 mg by mouth as needed (Give 1 tablet by mouth every 12 hours as needed for headache).   Suzzanne Cloud, NP  Active Nursing Home Medication Administration Guide (MAG)            SDOH:  (Social Determinants of Health) assessments and interventions performed: {yes/no:20286} SDOH Interventions    Flowsheet Row Telephone from 10/01/2022 in [place] from 02/28/2022 in [place] at [place] Management from 03/24/2021 in [place] at [place] from 12/11/2019 in [place] at [place] Interventions Intervention Not Indicated Intervention Not Indicated -- --  Housing Interventions Intervention Not Indicated Intervention Not Indicated -- --   Transportation Interventions Intervention Not Indicated Intervention Not Indicated -- --  Depression Interventions/Treatment  -- -- -- DHR4-1 Score <4 Follow-up Not Indicated  Financial Strain Interventions -- Intervention Not Indicated Intervention Not Indicated --  Physical Activity Interventions -- Intervention Not Indicated -- --  Stress Interventions -- Intervention Not Indicated -- --  Social Connections Interventions -- Intervention Not Indicated -- --      [place]: No Food Insecurity (10/01/2022)  Housing: Low Risk  (10/01/2022)  Transportation Needs: No Transportation Needs (10/01/2022)  Utilities: Not At Risk (08/27/2022)  Alcohol Screen: Low Risk  (02/28/2022)  Depression (PHQ2-9): Medium Risk (10/30/2022)  Financial Resource Strain: Medium Risk (02/28/2022)  Physical Activity: Insufficiently Active (02/28/2022)  Social Connections: Moderately Isolated (02/28/2022)  Stress: No Stress Concern Present (02/28/2022)  Tobacco Use: Low Risk  (10/30/2022)    Medication Assistance: None required.  Patient affirms current coverage meets needs.  Medication Access: Within the past 30 days, how often has patient missed a dose of medication? *** Is a pillbox or other method used to improve adherence? {YES/NO:21197} Factors that may affect medication adherence? {CHL DESC; BARRIERS:21522} Are meds synced by current pharmacy? {YES/NO:21197} Are meds delivered by current pharmacy? {YES/NO:21197} Does patient experience delays in picking up medications due to transportation concerns? {YES/NO:21197}  [facility] Reviewed: Is patient disadvantaged to use [facility]?: {YES/NO:21197} Current Rx insurance plan: *** Name and location of Current pharmacy:  [facility] - [place], [place] - [address] [place] [address] Phone: [phone] Fax: [phone]  [facility] services reviewed  with patient today?: {YES/NO:21197} Patient requests to transfer care to [facility]?: {YES/NO:21197} Reason patient declined to change pharmacies: {US patient preference:27474}  Compliance/Adherence/Medication fill history: Care Gaps: ***  Star-Rating Drugs: ***   Assessment/Plan  Hyperlipidemia: (LDL goal < 70) -Controlled (Appropriate, effective, safe, affordable)- LDL 59 -Current treatment: Atorvastatin 20 mg daily Nitroglycerin 0.4 mg SL prn -Medications previously tried: none  -Educated on Cholesterol goals;  -Recommended to continue current medication  CHF (Goal: Prevent exacerbations) -Controlled (Appropriate, effective, Query safe) - patient denies swelling, but frequent falls and very limited fluid intake add to risk of Lasix use.  -Diastolic HF, EF 24% 4010 -Pt reports limited fluid intake and frequent falls. He does not monitor home BP. He was evaluated in ED without abnormal findings 03/01/21, however, he reports recent weight loss. I think he should see PCP.  -Current treatment  Lasix 20 mg daily  Metoprolol succinate 100 mg daily -Medications previously tried: none   -Recommended increase fluid intake. Schedule visit with PCP for evaluation given weight loss.   Atrial Fibrillation (Goal: prevent stroke and major bleeding) -{US controlled/uncontrolled:25276} -CHADSVASC: *** -Current treatment: Metoprolol succinate 100 mg daily Eliquis 5 mg BID -Medications previously tried: *** -Home BP and HR readings: ***  -Counseled on {CCMAFIBCOUNSELING:25120} -{CCMPHARMDINTERVENTION:25122}  Insomnia Controlled (Appropriate, effective, Query safe) - per patient report, sleeping well May contribute to fall risk, may also prevent migraines Current treatment  Amitriptyline 50 mg daily HS -Medications previously tried: Melatonin Recommended continue current medications   Chronic Migraines -Followed by neurology [place]) -Patient not taking medication as  prescribed. Reports he was off all meds for 30 days last month. Also states he uses Depakote as needed for migraines. Uses Ubrelvy after Depakote. Repeats dose as needed. He is not clear on the directions for Ubrelvy or Depakote.  -Reports ~2 weeks since last migraine, reports migraines have been stable at 1 every 2-3 weeks. Reports his migraines are primarily heat related.  -Current treatment   Depakote ER 500 mg daily HS Ajovy 225 mg monthly Metoprolol succinate 100 mg daily  Amitriptyline 50 mg daily HS Gabapentin 300 mg - 1 capsule BF, 2 lunch, 2 PM Ubrelvy 100 mg PRN Hydrocodone-APAP 5-325 MG Q6H PRN As needed: Tizanidine, Zofran Reviewed directions for Ubrelvy - 50 as a single dose; if symptoms persist or return, may repeat dose after ?2 hours. Maximum: 200 mg per 24 hours. Discussed Depakote indication as preventative, not as needed. Avoid PRN use. Since patient has been off therapy, do not recommend resumption at this time without neurology appointment. Recommend patient schedule a follow up visit with neuro.  -Medications previously tried: Sumatriptan 100  mg (not effective), Tylenol -Will send update to neurology - patient off Depakote but denies increase in migraine frequency.  Seizure disorder (Goal: ***) -{US controlled/uncontrolled:25276} -Current treatment  Depakoate ER 500 mg HS -Medications previously tried: ***  -{CCMPHARMDINTERVENTION:25122}  GERD (Goal: Control symptoms) -Unable to assess today -Current treatment  Pantoprazole 40 mg daily -Medications previously tried: none reported  -Recommended - eval at next visit  Hypothyroidism (Goal: TSH WNL) -Controlled -Current treatment  Levothyroxine 112 mcg daily -Medications previously tried: none reported  -Recommended to continue current medication  BPH (Goal: improve urination) -{US controlled/uncontrolled:25276} -Night time urination frequency: ***/night -Current treatment: Tamsulosin 0.4 mg  daily -Medications previously tried: *** -Counseled on non-pharmacologic techniques such as Kegels -{CCMPHARMDINTERVENTION:25122}  Health Maintenance -Vaccine gaps: *** -Current therapy:  Ferrous sulfate 325 mg daily B Complex Vitamin D 1000 IU Cetirizine 10 mg Benadryl 25 mg PRN Flonase -Educated on {ccm supplement counseling:25128} -{CCM Patient satisfied:25129} -{CCMPHARMDINTERVENTION:25122}  ***

## 2022-11-05 ENCOUNTER — Other Ambulatory Visit: Payer: Self-pay

## 2022-11-05 ENCOUNTER — Emergency Department: Payer: 59

## 2022-11-05 ENCOUNTER — Encounter: Payer: Self-pay | Admitting: Emergency Medicine

## 2022-11-05 DIAGNOSIS — R000 Tachycardia, unspecified: Secondary | ICD-10-CM | POA: Diagnosis not present

## 2022-11-05 DIAGNOSIS — R17 Unspecified jaundice: Secondary | ICD-10-CM | POA: Diagnosis not present

## 2022-11-05 DIAGNOSIS — M542 Cervicalgia: Secondary | ICD-10-CM | POA: Diagnosis not present

## 2022-11-05 DIAGNOSIS — Z8673 Personal history of transient ischemic attack (TIA), and cerebral infarction without residual deficits: Secondary | ICD-10-CM

## 2022-11-05 DIAGNOSIS — Z823 Family history of stroke: Secondary | ICD-10-CM

## 2022-11-05 DIAGNOSIS — Z886 Allergy status to analgesic agent status: Secondary | ICD-10-CM | POA: Diagnosis not present

## 2022-11-05 DIAGNOSIS — E876 Hypokalemia: Secondary | ICD-10-CM | POA: Diagnosis not present

## 2022-11-05 DIAGNOSIS — Z91030 Bee allergy status: Secondary | ICD-10-CM

## 2022-11-05 DIAGNOSIS — Z833 Family history of diabetes mellitus: Secondary | ICD-10-CM

## 2022-11-05 DIAGNOSIS — G43909 Migraine, unspecified, not intractable, without status migrainosus: Secondary | ICD-10-CM | POA: Diagnosis present

## 2022-11-05 DIAGNOSIS — Z885 Allergy status to narcotic agent status: Secondary | ICD-10-CM | POA: Diagnosis not present

## 2022-11-05 DIAGNOSIS — I252 Old myocardial infarction: Secondary | ICD-10-CM | POA: Diagnosis not present

## 2022-11-05 DIAGNOSIS — R6520 Severe sepsis without septic shock: Secondary | ICD-10-CM | POA: Diagnosis present

## 2022-11-05 DIAGNOSIS — Z89422 Acquired absence of other left toe(s): Secondary | ICD-10-CM

## 2022-11-05 DIAGNOSIS — Z0389 Encounter for observation for other suspected diseases and conditions ruled out: Secondary | ICD-10-CM | POA: Diagnosis not present

## 2022-11-05 DIAGNOSIS — G9341 Metabolic encephalopathy: Secondary | ICD-10-CM | POA: Diagnosis not present

## 2022-11-05 DIAGNOSIS — B965 Pseudomonas (aeruginosa) (mallei) (pseudomallei) as the cause of diseases classified elsewhere: Secondary | ICD-10-CM | POA: Diagnosis present

## 2022-11-05 DIAGNOSIS — Z7901 Long term (current) use of anticoagulants: Secondary | ICD-10-CM | POA: Diagnosis not present

## 2022-11-05 DIAGNOSIS — Z8249 Family history of ischemic heart disease and other diseases of the circulatory system: Secondary | ICD-10-CM

## 2022-11-05 DIAGNOSIS — N183 Chronic kidney disease, stage 3 unspecified: Secondary | ICD-10-CM | POA: Diagnosis not present

## 2022-11-05 DIAGNOSIS — Z79899 Other long term (current) drug therapy: Secondary | ICD-10-CM

## 2022-11-05 DIAGNOSIS — F09 Unspecified mental disorder due to known physiological condition: Secondary | ICD-10-CM | POA: Diagnosis present

## 2022-11-05 DIAGNOSIS — E785 Hyperlipidemia, unspecified: Secondary | ICD-10-CM | POA: Diagnosis not present

## 2022-11-05 DIAGNOSIS — K219 Gastro-esophageal reflux disease without esophagitis: Secondary | ICD-10-CM | POA: Diagnosis present

## 2022-11-05 DIAGNOSIS — W19XXXA Unspecified fall, initial encounter: Secondary | ICD-10-CM | POA: Diagnosis not present

## 2022-11-05 DIAGNOSIS — Z8616 Personal history of COVID-19: Secondary | ICD-10-CM

## 2022-11-05 DIAGNOSIS — R059 Cough, unspecified: Secondary | ICD-10-CM | POA: Diagnosis not present

## 2022-11-05 DIAGNOSIS — Z96653 Presence of artificial knee joint, bilateral: Secondary | ICD-10-CM | POA: Diagnosis present

## 2022-11-05 DIAGNOSIS — R291 Meningismus: Secondary | ICD-10-CM | POA: Diagnosis not present

## 2022-11-05 DIAGNOSIS — N390 Urinary tract infection, site not specified: Secondary | ICD-10-CM | POA: Diagnosis present

## 2022-11-05 DIAGNOSIS — R296 Repeated falls: Secondary | ICD-10-CM | POA: Diagnosis present

## 2022-11-05 DIAGNOSIS — A419 Sepsis, unspecified organism: Principal | ICD-10-CM | POA: Diagnosis present

## 2022-11-05 DIAGNOSIS — R0902 Hypoxemia: Secondary | ICD-10-CM | POA: Diagnosis not present

## 2022-11-05 DIAGNOSIS — I5032 Chronic diastolic (congestive) heart failure: Secondary | ICD-10-CM | POA: Diagnosis present

## 2022-11-05 DIAGNOSIS — Z89412 Acquired absence of left great toe: Secondary | ICD-10-CM

## 2022-11-05 DIAGNOSIS — Z1152 Encounter for screening for COVID-19: Secondary | ICD-10-CM | POA: Diagnosis not present

## 2022-11-05 DIAGNOSIS — Z96611 Presence of right artificial shoulder joint: Secondary | ICD-10-CM | POA: Diagnosis present

## 2022-11-05 DIAGNOSIS — Z91040 Latex allergy status: Secondary | ICD-10-CM | POA: Diagnosis not present

## 2022-11-05 DIAGNOSIS — F32A Depression, unspecified: Secondary | ICD-10-CM | POA: Diagnosis not present

## 2022-11-05 DIAGNOSIS — R54 Age-related physical debility: Secondary | ICD-10-CM | POA: Diagnosis present

## 2022-11-05 DIAGNOSIS — G40909 Epilepsy, unspecified, not intractable, without status epilepticus: Secondary | ICD-10-CM | POA: Diagnosis present

## 2022-11-05 DIAGNOSIS — G039 Meningitis, unspecified: Secondary | ICD-10-CM | POA: Diagnosis not present

## 2022-11-05 DIAGNOSIS — Z91010 Allergy to peanuts: Secondary | ICD-10-CM | POA: Diagnosis not present

## 2022-11-05 DIAGNOSIS — E039 Hypothyroidism, unspecified: Secondary | ICD-10-CM | POA: Diagnosis present

## 2022-11-05 DIAGNOSIS — R0689 Other abnormalities of breathing: Secondary | ICD-10-CM | POA: Diagnosis not present

## 2022-11-05 DIAGNOSIS — Z79890 Hormone replacement therapy (postmenopausal): Secondary | ICD-10-CM

## 2022-11-05 DIAGNOSIS — I4891 Unspecified atrial fibrillation: Secondary | ICD-10-CM | POA: Diagnosis present

## 2022-11-05 DIAGNOSIS — Z96612 Presence of left artificial shoulder joint: Secondary | ICD-10-CM | POA: Diagnosis present

## 2022-11-05 DIAGNOSIS — Z803 Family history of malignant neoplasm of breast: Secondary | ICD-10-CM

## 2022-11-05 DIAGNOSIS — G4730 Sleep apnea, unspecified: Secondary | ICD-10-CM | POA: Diagnosis present

## 2022-11-05 DIAGNOSIS — M4802 Spinal stenosis, cervical region: Secondary | ICD-10-CM | POA: Diagnosis not present

## 2022-11-05 LAB — COMPREHENSIVE METABOLIC PANEL
ALT: 8 U/L (ref 0–44)
AST: 21 U/L (ref 15–41)
Albumin: 2.7 g/dL — ABNORMAL LOW (ref 3.5–5.0)
Alkaline Phosphatase: 78 U/L (ref 38–126)
Anion gap: 10 (ref 5–15)
BUN: 20 mg/dL (ref 8–23)
CO2: 29 mmol/L (ref 22–32)
Calcium: 8.3 mg/dL — ABNORMAL LOW (ref 8.9–10.3)
Chloride: 100 mmol/L (ref 98–111)
Creatinine, Ser: 1.06 mg/dL (ref 0.61–1.24)
GFR, Estimated: >60 mL/min (ref >60)
Glucose, Bld: 126 mg/dL — ABNORMAL HIGH (ref 70–99)
Potassium: 3.5 mmol/L (ref 3.5–5.1)
Sodium: 139 mmol/L (ref 135–145)
Total Bilirubin: 2.1 mg/dL — ABNORMAL HIGH (ref 0.3–1.2)
Total Protein: 6.9 g/dL (ref 6.5–8.1)

## 2022-11-05 LAB — CBC WITH DIFFERENTIAL/PLATELET
Abs Immature Granulocytes: 0.09 10*3/uL — ABNORMAL HIGH (ref 0.00–0.07)
Basophils Absolute: 0.1 10*3/uL (ref 0.0–0.1)
Basophils Relative: 0 %
Eosinophils Absolute: 0 10*3/uL (ref 0.0–0.5)
Eosinophils Relative: 0 %
HCT: 40.3 % (ref 39.0–52.0)
Hemoglobin: 12.8 g/dL — ABNORMAL LOW (ref 13.0–17.0)
Immature Granulocytes: 0 %
Lymphocytes Relative: 4 %
Lymphs Abs: 0.8 10*3/uL (ref 0.7–4.0)
MCH: 30.9 pg (ref 26.0–34.0)
MCHC: 31.8 g/dL (ref 30.0–36.0)
MCV: 97.3 fL (ref 80.0–100.0)
Monocytes Absolute: 3.4 10*3/uL — ABNORMAL HIGH (ref 0.1–1.0)
Monocytes Relative: 16 %
Neutro Abs: 17 10*3/uL — ABNORMAL HIGH (ref 1.7–7.7)
Neutrophils Relative %: 80 %
Platelets: 242 10*3/uL (ref 150–400)
RBC: 4.14 MIL/uL — ABNORMAL LOW (ref 4.22–5.81)
RDW: 15.4 % (ref 11.5–15.5)
Smear Review: NORMAL
WBC: 21.3 10*3/uL — ABNORMAL HIGH (ref 4.0–10.5)
nRBC: 0 % (ref 0.0–0.2)

## 2022-11-05 LAB — TROPONIN I (HIGH SENSITIVITY): Troponin I (High Sensitivity): 8 ng/L (ref <18)

## 2022-11-05 NOTE — ED Triage Notes (Signed)
Pt presents via [facility] from home following a fall. Pt states he "slid" out of bed tonight and endorses posterior neck pain. Pt was hypotensive with EMS BP 96/60 and was provided 264m NS en route. Pt has a strong productive cough that developed a few days ago - No O2 requirement. Pt refused C-collar application with EMS & triage. Pt is poor historian and has conflicting responses about his reason for being here. Denies CP or SOB.

## 2022-11-06 ENCOUNTER — Inpatient Hospital Stay
Admission: EM | Admit: 2022-11-06 | Discharge: 2022-11-09 | DRG: 871 | Disposition: A | Discharge status: Skilled Nursing Facility | Payer: 59 | Attending: Family Medicine | Admitting: Family Medicine

## 2022-11-06 ENCOUNTER — Inpatient Hospital Stay: Payer: 59

## 2022-11-06 DIAGNOSIS — R519 Headache, unspecified: Secondary | ICD-10-CM | POA: Diagnosis present

## 2022-11-06 DIAGNOSIS — Z91040 Latex allergy status: Secondary | ICD-10-CM | POA: Diagnosis not present

## 2022-11-06 DIAGNOSIS — G40909 Epilepsy, unspecified, not intractable, without status epilepticus: Secondary | ICD-10-CM | POA: Diagnosis present

## 2022-11-06 DIAGNOSIS — E039 Hypothyroidism, unspecified: Secondary | ICD-10-CM | POA: Diagnosis present

## 2022-11-06 DIAGNOSIS — R54 Age-related physical debility: Secondary | ICD-10-CM | POA: Diagnosis present

## 2022-11-06 DIAGNOSIS — R6520 Severe sepsis without septic shock: Secondary | ICD-10-CM | POA: Diagnosis present

## 2022-11-06 DIAGNOSIS — G039 Meningitis, unspecified: Secondary | ICD-10-CM

## 2022-11-06 DIAGNOSIS — I4891 Unspecified atrial fibrillation: Secondary | ICD-10-CM | POA: Diagnosis present

## 2022-11-06 DIAGNOSIS — E876 Hypokalemia: Secondary | ICD-10-CM | POA: Diagnosis present

## 2022-11-06 DIAGNOSIS — R4182 Altered mental status, unspecified: Secondary | ICD-10-CM

## 2022-11-06 DIAGNOSIS — Z886 Allergy status to analgesic agent status: Secondary | ICD-10-CM | POA: Diagnosis not present

## 2022-11-06 DIAGNOSIS — R296 Repeated falls: Secondary | ICD-10-CM | POA: Diagnosis present

## 2022-11-06 DIAGNOSIS — R291 Meningismus: Secondary | ICD-10-CM

## 2022-11-06 DIAGNOSIS — Z885 Allergy status to narcotic agent status: Secondary | ICD-10-CM | POA: Diagnosis not present

## 2022-11-06 DIAGNOSIS — A419 Sepsis, unspecified organism: Secondary | ICD-10-CM | POA: Diagnosis not present

## 2022-11-06 DIAGNOSIS — B965 Pseudomonas (aeruginosa) (mallei) (pseudomallei) as the cause of diseases classified elsewhere: Secondary | ICD-10-CM | POA: Diagnosis present

## 2022-11-06 DIAGNOSIS — Z7901 Long term (current) use of anticoagulants: Secondary | ICD-10-CM | POA: Diagnosis not present

## 2022-11-06 DIAGNOSIS — Z0389 Encounter for observation for other suspected diseases and conditions ruled out: Secondary | ICD-10-CM | POA: Diagnosis not present

## 2022-11-06 DIAGNOSIS — Z8616 Personal history of COVID-19: Secondary | ICD-10-CM | POA: Diagnosis not present

## 2022-11-06 DIAGNOSIS — G9341 Metabolic encephalopathy: Secondary | ICD-10-CM | POA: Diagnosis present

## 2022-11-06 DIAGNOSIS — I5032 Chronic diastolic (congestive) heart failure: Secondary | ICD-10-CM | POA: Diagnosis present

## 2022-11-06 DIAGNOSIS — N390 Urinary tract infection, site not specified: Secondary | ICD-10-CM

## 2022-11-06 DIAGNOSIS — Z91030 Bee allergy status: Secondary | ICD-10-CM | POA: Diagnosis not present

## 2022-11-06 DIAGNOSIS — I252 Old myocardial infarction: Secondary | ICD-10-CM | POA: Diagnosis not present

## 2022-11-06 DIAGNOSIS — F09 Unspecified mental disorder due to known physiological condition: Secondary | ICD-10-CM | POA: Diagnosis present

## 2022-11-06 DIAGNOSIS — R8290 Unspecified abnormal findings in urine: Secondary | ICD-10-CM

## 2022-11-06 DIAGNOSIS — Z1152 Encounter for screening for COVID-19: Secondary | ICD-10-CM | POA: Diagnosis not present

## 2022-11-06 DIAGNOSIS — K219 Gastro-esophageal reflux disease without esophagitis: Secondary | ICD-10-CM | POA: Diagnosis present

## 2022-11-06 DIAGNOSIS — Z91010 Allergy to peanuts: Secondary | ICD-10-CM | POA: Diagnosis not present

## 2022-11-06 HISTORY — DX: Meningitis, unspecified: G03.9

## 2022-11-06 LAB — CBC
HCT: 30 % — ABNORMAL LOW (ref 39.0–52.0)
Hemoglobin: 9.5 g/dL — ABNORMAL LOW (ref 13.0–17.0)
MCH: 31.5 pg (ref 26.0–34.0)
MCHC: 31.7 g/dL (ref 30.0–36.0)
MCV: 99.3 fL (ref 80.0–100.0)
Platelets: 173 10*3/uL (ref 150–400)
RBC: 3.02 MIL/uL — ABNORMAL LOW (ref 4.22–5.81)
RDW: 15.6 % — ABNORMAL HIGH (ref 11.5–15.5)
WBC: 14.1 10*3/uL — ABNORMAL HIGH (ref 4.0–10.5)
nRBC: 0 % (ref 0.0–0.2)

## 2022-11-06 LAB — TROPONIN I (HIGH SENSITIVITY): Troponin I (High Sensitivity): 7 ng/L (ref <18)

## 2022-11-06 LAB — URINALYSIS, ROUTINE W REFLEX MICROSCOPIC
Bilirubin Urine: NEGATIVE
Glucose, UA: NEGATIVE mg/dL
Ketones, ur: NEGATIVE mg/dL
Nitrite: POSITIVE — AB
Protein, ur: 30 mg/dL — AB
Specific Gravity, Urine: 1.021 (ref 1.005–1.030)
Squamous Epithelial / HPF: NONE SEEN /HPF (ref 0–5)
WBC, UA: >50 WBC/hpf — ABNORMAL HIGH (ref 0–5)
pH: 6 (ref 5.0–8.0)

## 2022-11-06 LAB — BASIC METABOLIC PANEL
Anion gap: 8 (ref 5–15)
BUN: 19 mg/dL (ref 8–23)
CO2: 23 mmol/L (ref 22–32)
Calcium: 6.9 mg/dL — ABNORMAL LOW (ref 8.9–10.3)
Chloride: 105 mmol/L (ref 98–111)
Creatinine, Ser: 0.77 mg/dL (ref 0.61–1.24)
GFR, Estimated: >60 mL/min (ref >60)
Glucose, Bld: 160 mg/dL — ABNORMAL HIGH (ref 70–99)
Potassium: 3.5 mmol/L (ref 3.5–5.1)
Sodium: 136 mmol/L (ref 135–145)

## 2022-11-06 LAB — RESP PANEL BY RT-PCR (RSV, FLU A&B, COVID)  RVPGX2
Influenza A by PCR: NEGATIVE
Influenza B by PCR: NEGATIVE
Resp Syncytial Virus by PCR: NEGATIVE
SARS Coronavirus 2 by RT PCR: NEGATIVE

## 2022-11-06 LAB — VALPROIC ACID LEVEL: Valproic Acid Lvl: 17 ug/mL — ABNORMAL LOW (ref 50.0–100.0)

## 2022-11-06 LAB — PROCALCITONIN: Procalcitonin: 0.12 ng/mL

## 2022-11-06 LAB — CORTISOL-AM, BLOOD: Cortisol - AM: 6.1 ug/dL — ABNORMAL LOW (ref 6.7–22.6)

## 2022-11-06 LAB — CSF CELL COUNT WITH DIFFERENTIAL
Eosinophils, CSF: 0 %
Lymphs, CSF: 44 %
Monocyte-Macrophage-Spinal Fluid: 44 %
RBC Count, CSF: 2 /mm3 (ref 0–3)
Segmented Neutrophils-CSF: 12 %
Tube #: 3
WBC, CSF: 5 /mm3 (ref 0–5)

## 2022-11-06 LAB — PROTIME-INR
INR: 1.8 — ABNORMAL HIGH (ref 0.8–1.2)
Prothrombin Time: 21 seconds — ABNORMAL HIGH (ref 11.4–15.2)

## 2022-11-06 LAB — PROTEIN AND GLUCOSE, CSF
Glucose, CSF: 75 mg/dL — ABNORMAL HIGH (ref 40–70)
Total  Protein, CSF: 63 mg/dL — ABNORMAL HIGH (ref 15–45)

## 2022-11-06 LAB — LACTIC ACID, PLASMA
Lactic Acid, Venous: 1.3 mmol/L (ref 0.5–1.9)
Lactic Acid, Venous: 4 mmol/L (ref 0.5–1.9)

## 2022-11-06 MED ORDER — VANCOMYCIN HCL 1250 MG/250ML IV SOLN
1250.0000 mg | Freq: Two times a day (BID) | INTRAVENOUS | Status: DC
  Administered 2022-11-06 – 2022-11-07 (×2): 1250 mg via INTRAVENOUS
  Filled 2022-11-06 (×3): qty 250

## 2022-11-06 MED ORDER — ACETAMINOPHEN 650 MG RE SUPP: 650.0000 mg | Freq: Four times a day (QID) | RECTAL | Status: DC | PRN

## 2022-11-06 MED ORDER — SODIUM CHLORIDE 0.9 % IV SOLN
2.0000 g | Freq: Two times a day (BID) | INTRAVENOUS | Status: DC
  Administered 2022-11-06 – 2022-11-07 (×2): 2 g via INTRAVENOUS
  Filled 2022-11-06 (×2): qty 20

## 2022-11-06 MED ORDER — SODIUM CHLORIDE 0.9 % IV BOLUS (SEPSIS)
1000.0000 mL | Freq: Once | INTRAVENOUS | Status: AC
  Administered 2022-11-06: 1000 mL via INTRAVENOUS

## 2022-11-06 MED ORDER — LACTATED RINGERS IV BOLUS (SEPSIS): 1000.0000 mL | Freq: Once | INTRAVENOUS | Status: DC

## 2022-11-06 MED ORDER — LEVOTHYROXINE SODIUM 112 MCG PO TABS
112.0000 ug | ORAL_TABLET | Freq: Every day | ORAL | Status: DC
  Administered 2022-11-07 – 2022-11-09 (×3): 112 ug via ORAL
  Filled 2022-11-06 (×4): qty 1

## 2022-11-06 MED ORDER — ACETAMINOPHEN 325 MG PO TABS
650.0000 mg | ORAL_TABLET | Freq: Four times a day (QID) | ORAL | Status: DC | PRN
  Administered 2022-11-06 – 2022-11-08 (×2): 650 mg via ORAL
  Filled 2022-11-06 (×2): qty 2

## 2022-11-06 MED ORDER — DIVALPROEX SODIUM ER 500 MG PO TB24
500.0000 mg | ORAL_TABLET | Freq: Every day | ORAL | Status: DC
  Administered 2022-11-06 – 2022-11-08 (×3): 500 mg via ORAL
  Filled 2022-11-06: qty 1
  Filled 2022-11-06: qty 2
  Filled 2022-11-06: qty 1

## 2022-11-06 MED ORDER — SODIUM CHLORIDE 0.9 % IV BOLUS
500.0000 mL | Freq: Once | INTRAVENOUS | Status: AC
  Administered 2022-11-06: 500 mL via INTRAVENOUS

## 2022-11-06 MED ORDER — SODIUM CHLORIDE 0.9 % IV SOLN: INTRAVENOUS | Status: DC

## 2022-11-06 MED ORDER — ONDANSETRON HCL 4 MG/2ML IJ SOLN: 4.0000 mg | Freq: Four times a day (QID) | INTRAMUSCULAR | Status: DC | PRN

## 2022-11-06 MED ORDER — LACTATED RINGERS IV SOLN: INTRAVENOUS | Status: DC

## 2022-11-06 MED ORDER — DEXTROSE 5 % IV SOLN
800.0000 mg | Freq: Three times a day (TID) | INTRAVENOUS | Status: DC
  Administered 2022-11-06 – 2022-11-08 (×8): 800 mg via INTRAVENOUS
  Filled 2022-11-06 (×10): qty 16

## 2022-11-06 MED ORDER — FENTANYL CITRATE PF 50 MCG/ML IJ SOSY
50.0000 ug | PREFILLED_SYRINGE | Freq: Once | INTRAMUSCULAR | Status: AC
  Administered 2022-11-06: 50 ug via INTRAVENOUS
  Filled 2022-11-06: qty 1

## 2022-11-06 MED ORDER — VANCOMYCIN HCL IN DEXTROSE 1-5 GM/200ML-% IV SOLN: 1000.0000 mg | Freq: Two times a day (BID) | INTRAVENOUS | Status: DC

## 2022-11-06 MED ORDER — DEXTROSE 5 % IV SOLN
800.0000 mg | Freq: Once | INTRAVENOUS | Status: AC
  Administered 2022-11-06: 800 mg via INTRAVENOUS
  Filled 2022-11-06: qty 16

## 2022-11-06 MED ORDER — ATORVASTATIN CALCIUM 20 MG PO TABS
20.0000 mg | ORAL_TABLET | Freq: Every day | ORAL | Status: DC
  Administered 2022-11-07 – 2022-11-09 (×3): 20 mg via ORAL
  Filled 2022-11-06 (×5): qty 1

## 2022-11-06 MED ORDER — AMITRIPTYLINE HCL 25 MG PO TABS
50.0000 mg | ORAL_TABLET | Freq: Every day | ORAL | Status: DC
  Administered 2022-11-06 – 2022-11-08 (×3): 50 mg via ORAL
  Filled 2022-11-06: qty 1
  Filled 2022-11-06 (×2): qty 2

## 2022-11-06 MED ORDER — SODIUM CHLORIDE 0.9 % IV SOLN: 2.0000 g | Freq: Once | INTRAVENOUS | Status: DC

## 2022-11-06 MED ORDER — DEXAMETHASONE SODIUM PHOSPHATE 10 MG/ML IJ SOLN
10.0000 mg | Freq: Once | INTRAMUSCULAR | Status: AC
  Administered 2022-11-06: 10 mg via INTRAVENOUS
  Filled 2022-11-06: qty 1

## 2022-11-06 MED ORDER — ACETAMINOPHEN 500 MG PO TABS
1000.0000 mg | ORAL_TABLET | Freq: Once | ORAL | Status: AC
  Administered 2022-11-06: 1000 mg via ORAL
  Filled 2022-11-06: qty 2

## 2022-11-06 MED ORDER — PANTOPRAZOLE SODIUM 40 MG PO TBEC
40.0000 mg | DELAYED_RELEASE_TABLET | ORAL | Status: DC
  Administered 2022-11-08: 40 mg via ORAL
  Filled 2022-11-06 (×2): qty 1

## 2022-11-06 MED ORDER — ONDANSETRON HCL 4 MG/2ML IJ SOLN
4.0000 mg | Freq: Once | INTRAMUSCULAR | Status: AC
  Administered 2022-11-06: 4 mg via INTRAVENOUS
  Filled 2022-11-06: qty 2

## 2022-11-06 MED ORDER — VANCOMYCIN HCL 2000 MG/400ML IV SOLN
2000.0000 mg | Freq: Once | INTRAVENOUS | Status: AC
  Administered 2022-11-06: 2000 mg via INTRAVENOUS
  Filled 2022-11-06: qty 400

## 2022-11-06 MED ORDER — VANCOMYCIN HCL IN DEXTROSE 1-5 GM/200ML-% IV SOLN: 1000.0000 mg | Freq: Once | INTRAVENOUS | Status: DC

## 2022-11-06 MED ORDER — LACTATED RINGERS IV SOLN: INTRAVENOUS | Status: AC

## 2022-11-06 MED ORDER — ONDANSETRON HCL 4 MG PO TABS: 4.0000 mg | ORAL_TABLET | Freq: Four times a day (QID) | ORAL | Status: DC | PRN

## 2022-11-06 MED ORDER — SODIUM CHLORIDE 0.9 % IV SOLN
2.0000 g | Freq: Once | INTRAVENOUS | Status: AC
  Administered 2022-11-06: 2 g via INTRAVENOUS
  Filled 2022-11-06: qty 20

## 2022-11-06 MED ORDER — SODIUM CHLORIDE 0.9 % IV SOLN
2.0000 g | INTRAVENOUS | Status: DC
  Administered 2022-11-06 – 2022-11-07 (×6): 2 g via INTRAVENOUS
  Filled 2022-11-06 (×8): qty 2000
  Filled 2022-11-06: qty 2
  Filled 2022-11-06: qty 2000

## 2022-11-06 MED ORDER — SODIUM CHLORIDE 0.9 % IV SOLN: INTRAVENOUS | Status: AC

## 2022-11-06 MED ORDER — LIDOCAINE HCL (PF) 1 % IJ SOLN
5.0000 mL | Freq: Once | INTRAMUSCULAR | Status: AC
  Administered 2022-11-06: 5 mL

## 2022-11-06 MED ORDER — TAMSULOSIN HCL 0.4 MG PO CAPS
0.4000 mg | ORAL_CAPSULE | Freq: Every day | ORAL | Status: DC
  Administered 2022-11-07 – 2022-11-09 (×3): 0.4 mg via ORAL
  Filled 2022-11-06 (×4): qty 1

## 2022-11-06 MED ORDER — HYDROCODONE-ACETAMINOPHEN 5-325 MG PO TABS: 1.0000 | ORAL_TABLET | ORAL | Status: DC | PRN

## 2022-11-06 NOTE — Progress Notes (Signed)
Pharmacy Antibiotic Note  Nicholas [name] is a 72 y.o. male admitted on 11/06/2022 with meningitis.  Pharmacy has been consulted for Acyclovir, Vancomycin, Ampicillin dosing for 14 days.  Plan: Ampicillin 2 gm q4hr & Acyclovir 800 mg q8hr per indication & renal fxn.  Pt given Vancomycin 2000 mg once. Vancomycin 1000 mg IV Q 12 hrs. Goal AUC 400-550. Expected AUC: 502 SCr used: 1.06  Pharmacy will continue to follow and will adjust abx dosing whenever warranted.  Temp (24hrs), Avg:99.9 F (37.7 C), Min:98.4 F (36.9 C), Max:101.4 F (38.6 C)   Recent Labs  Lab 11/05/22 2201 11/06/22 0031  WBC 21.3*  --   CREATININE 1.06  --   LATICACIDVEN  --  1.3    Estimated Creatinine Clearance: 73.1 mL/min (by C-G formula based on SCr of 1.06 mg/dL).    Allergies  Allergen Reactions   Peanut-Containing Drug Products Anaphylaxis and Dermatitis   Yellow Jacket Venom [Bee Venom] Anaphylaxis and Other (See Comments)    Respiratory Distress   Celebrex [Celecoxib] Other (See Comments)    BLACK STOOL?MELENA?   Percocet [Oxycodone-Acetaminophen] Hives, Swelling and Other (See Comments)   Latex Rash   Nsaids Rash    Antimicrobials this admission: 1/23 Acyclovir >> x 14 days 1/23 Ampicillin >> x 14 days 1/23 Ceftriaxone >> x 14 days 1/23 Vancomycin >> x 14 days  Microbiology results: 1/23 BCx: Pending 1/23 UCx: Pending  1/23 CSFCx: Canceled d/t Eliquis PTA med  Thank you for allowing pharmacy to be a part of this patient's care.  Renda Rolls, PharmD, [facility] 11/06/2022 1:51 AM

## 2022-11-06 NOTE — Progress Notes (Signed)
PHARMACY -  BRIEF ANTIBIOTIC NOTE   Pharmacy has received consult(s) for Vancomycin & Acyclovir from an ED provider.  The patient's profile has been reviewed for ht/wt/allergies/indication/available labs.    One time order(s) placed for Vancomycin 2 gm & Acyclovir 800 mg.  Further antibiotics/pharmacy consults should be ordered by admitting physician if indicated.                       Thank you, Renda Rolls, PharmD, MBA 11/06/2022 1:00 AM

## 2022-11-06 NOTE — Care Plan (Signed)
This 72 yrs old Male with PMH significant for Diastolic CHF, hypothyroidism, seizure disorder, A-fib on Eliquis, s/p cardioversion on 08/24/2022, MSSA bacteremia in 08/2022, chronic headaches followed by neurology, recently started on Ubrelvy, Cognitive disorder and frequent falls who was recently discharged from SNF on 10/24/2022 and he lives alone presented to the ED after he slid out of his bed in night with subsequent complaints of neck pain. He was hypotensive when EMS arrived, 96/60.  He reports several day history of cough.  ED workup revealed temp 101.4, BP 100/51, SpO2 98% on room air.  WBC 21 K with lactic acid 1.3.  COVID, influenza and RSV negative.  UA consistent with UTI.  CT head nonacute and chest x-ray no acute findings.  Patient was found to be meningitis on exam and started on empiric antibiotics for possible meningitis(Rocephin, vancomycin, acyclovir and dexamethasone.)  Eliquis was kept on hold.  IR consulted and patient underwent spinal tap.  CSF slightly high proteins but normal WBCs does not appear like bacterial meningitis.  Continue IV antibiotic follow-up cultures.  Patient was seen and examined at bedside , he reports doing better.

## 2022-11-06 NOTE — Assessment & Plan Note (Signed)
Suspect acute meningitis Continue Rocephin, vancomycin and acyclovir IR consult for LP Keep n.p.o. overnight Hold Eliquis Pain control

## 2022-11-06 NOTE — Progress Notes (Signed)
Pharmacy Antibiotic Note  SELIG [name] is a 72 y.o. male admitted on 11/06/2022 with ?meningitis.  Pharmacy has been consulted for Acyclovir, Vancomycin, Ampicillin dosing for 14 days. -Hx MSSA bacteremia 08/2022  -also on Ceftriaxone 2 gm IV q12h  Plan:  Scr improved 1.06>>0.77  -continue Ampicillin 2 gm q4hr  -continue Acyclovir 800 mg IV q8hr (using Adjusted body weight)  -Adjust Vancomycin to 1250 mg IV q12h Goal AUC 400-550. Expected AUC: 465 Cmin 13.4 SCr used: 0.77 * check Vanc trough levels in meningitis  Pharmacy will continue to follow and will adjust abx dosing as warranted.  Temp (24hrs), Avg:98.9 F (37.2 C), Min:97.9 F (36.6 C), Max:101.4 F (38.6 C)   Recent Labs  Lab 11/05/22 2201 11/06/22 0031 11/06/22 0509  WBC 21.3*  --  14.1*  CREATININE 1.06  --  0.77  LATICACIDVEN  --  1.3 4.0*     Estimated Creatinine Clearance: 96.8 mL/min (by C-G formula based on SCr of 0.77 mg/dL).    Allergies  Allergen Reactions   Peanut-Containing Drug Products Anaphylaxis and Dermatitis   Yellow Jacket Venom [Bee Venom] Anaphylaxis and Other (See Comments)    Respiratory Distress   Celebrex [Celecoxib] Other (See Comments)    BLACK STOOL?MELENA?   Percocet [Oxycodone-Acetaminophen] Hives, Swelling and Other (See Comments)   Latex Rash   Nsaids Rash    Antimicrobials this admission: 1/23 Acyclovir >> x 14 days 1/23 Ampicillin >> x 14 days 1/23 Ceftriaxone >> x 14 days 1/23 Vancomycin >> x 14 days  Microbiology results: 1/23 BCx: NG<12hr 1/23 UCx: Pending  1/23 CSFCx: Canceled d/t Eliquis PTA med -consult to IR for LP  Thank you for allowing pharmacy to be a part of this patient's care.  Chinita Greenland PharmD Clinical Pharmacist 11/06/2022

## 2022-11-06 NOTE — Hospital Course (Signed)
     Chronic diastolic CHF (congestive heart failure) (HCC) Clinically euvolemic Continue metoprolol and furosemide   Hypothyroidism Continue levothyroxine   COVID-19 08/12/22 completed Paxlovid 08/20/22 Asymptomatic Off airborne precautions   Persistent atrial fibrillation ([place]) S/p TEE cardioversion for RVR on 08/24/2022 Patient currently in sinus rhythm Continue apixaban and metoprolol   Seizure disorder (HCC) Continue Depakote and Neurontin   Chronic headache Continue Ajovy

## 2022-11-06 NOTE — Assessment & Plan Note (Signed)
Patient was recently switched from [place] to [place] Will hold both agents for now Can consider neurology consult

## 2022-11-06 NOTE — Assessment & Plan Note (Addendum)
Chronic anticoagulation Holding Eliquis for spinal tap in the a.m. Holding metoprolol due to soft blood pressures

## 2022-11-06 NOTE — Assessment & Plan Note (Addendum)
History of MSSA bacteremia of unknown source November 2023.  TEE negative for vegetations Sepsis criteria includes fever, tachycardia, borderline blood pressure, leukocytosis Patient with neck pain and headache albeit with history of chronic headaches Has a cough but with negative viral panel and negative chest x-ray Sepsis fluids Will treat empirically for meningitis as outlined below Follow-up urinalysis to evaluate for UTI--> UA consistent with UTI Follow blood cultures

## 2022-11-06 NOTE — Progress Notes (Signed)
Pt being followed by [facility] for Sepsis protocol. 

## 2022-11-06 NOTE — Assessment & Plan Note (Signed)
Continue Depakote

## 2022-11-06 NOTE — H&P (Addendum)
History and Physical    Patient: Nicholas [name] GEX:[id number] DOB: July 18, 1951 DOA: 11/06/2022 DOS: the patient was seen and examined on 11/06/2022 PCP: [name], MD  Patient coming from: Home  Chief Complaint:  Chief Complaint  Patient presents with   Fall   Altered Mental Status    HPI: Nicholas [name] is a 72 y.o. male with medical history significant for Diastolic CHF, hypothyroidism, , seizure disorder, Afib on Eliquis s/p cardioversion 08/24/2022, ,  MSSA bacteremia 08/2022, chronic headache followed by neurology, recently started on Ubrelvy, cognitive disorder and frequent falls, discharged from SNF on 10/24/2022, and who lives alone, who presents to the ED by EMS after sliding out of bed on the night of arrival with subsequent complaint of neck pain.  He was hypotensive with EMS at 96/60.  He complains of a several day history of a cough.  He denies shortness of breath, chest pain.  He endorses headache in addition to neck pain. ED course and data review: Tmax 101.4, pulse 98, BP 100/51 with O2 sat 98% on room air.  WBC 2100 with lactic acid 1.3 and procalcitonin 0.10.  Respiratory viral panel negative for COVID flu and RSV.  CMP unremarkable. UA pending. CT head nonacute and chest x-ray with no acute findings. Patient was found to be meningitic on exam and started on empiric treatment for possible meningitis with Rocephin, vancomycin and acyclovir as well as dexamethasone.  LP was deferred due to being on Eliquis.  Hospitalist consulted for admission.   Review of Systems: As mentioned in the history of present illness. All other systems reviewed and are negative.  Past Medical History:  Diagnosis Date   Acute kidney failure 08/2008   "cleared up"  no problems since   Allergy    Anxiety    Arthritis    Asthma    ?of this no inhaler   Blood transfusion    CHF (congestive heart failure) ([place])    ?of this, pt denies   CLOSTRIDIUM DIFFICILE COLITIS 07/04/2010   Annotation:  12/09, 2/10 Qualifier: Diagnosis of  By: [name], [name]     COVID-19 08/12/22 completed Paxlovid 08/20/22 08/26/2022   Depression with anxiety    Duodenitis determined by biopsy 02/2016   peptic likely due to aleve (erosive gastropathy with duodenal erosions)   ECZEMA 07/04/2010   Qualifier: Diagnosis of  By: [name], [name]     Elevated liver enzymes    Fatty liver    Full dentures    GERD (gastroesophageal reflux disease)    Heart attack ([place])    08/2008 (likely demand ischemia in the setting of MSSA sepsis/R TKA  infection)10-2008   History of hiatal hernia    History of stomach ulcers    Hypothyroidism    Lower GI bleeding    MALAR AND MAXILLARY BONES CLOSED FRACTURE 07/04/2010   Annotation: ORIF Qualifier: Diagnosis of  By: [name], [name]     Migraine    "definitely"   MRSA (methicillin resistant Staphylococcus aureus)    in leg, had to place steel rod in leg   Personal history of colonic adenoma 06/01/2003   PFO (patent foramen ovale)    small PFO by 08/2008 TEE   Pneumonia 08/2008   "while in ICU"   Pneumonia due to COVID-19 virus 08/12/2022   Seizures ([place]) 2009   "long time ago"     Sleep apnea    does not wear CPAP   Stroke ([place]) 2010   unable to  complete sentences at times   Wears glasses    Past Surgical History:  Procedure Laterality Date   AMPUTATION Left 01/24/2022   Procedure: LEFT GREAT TOE AMPUTATION AT METATARSOPHALANGEAL JOINT AND SECOND TOE AMPUTATION;  Surgeon: Newt Minion, MD;  Location: [place];  Service: Orthopedics;  Laterality: Left;   anterior  nerve transposition  07/2009   left ulnar nerve   antibiotic spacer exchange  11/2008; 08/2006   right knee   ARTHROTOMY  08/2008   right knee w/I&D   CARDIOVASCULAR STRESS TEST  2013   stress EKG - negative for ischemia, 4 min 7.3 METs, normal blood pressure response   CARDIOVERSION N/A 08/24/2022   Procedure: CARDIOVERSION;  Surgeon: Minna Merritts, MD;  Location: [facility];  Service:  Cardiovascular;  Laterality: N/A;   CATARACT EXTRACTION     COLONOSCOPY  11/2012   diverticulosis, rpt 10 yrs [name])   ESOPHAGOGASTRODUODENOSCOPY  02/2016   erosive gastropathy with duodenal erosions [name])   HARDWARE REMOVAL  04/2006   right knee w/antibiotic spacers placed   INGUINAL HERNIA REPAIR  early 1990's   bilateral   JOINT REPLACEMENT Bilateral    KNEE ARTHROSCOPY  10/2001   right   KNEE FUSION  03/2009   right knee removal; antibiotic spacers;    LEFT HEART CATH AND CORONARY ANGIOGRAPHY N/A 02/04/2018    WNL (Patwardhan, Reynold Bowen, MD)   LUMBAR LAMINECTOMY/DECOMPRESSION MICRODISCECTOMY N/A 12/05/2018   Procedure: LUMBAR THREE TO FOUR AND LUMBAR FOUR TO FIVE DECOMPRESSION;  Surgeon: [name], MD;  Location: [place];  Service: Orthopedics;  Laterality: N/A;   MULTIPLE TOOTH EXTRACTIONS     REPLACEMENT TOTAL KNEE  08/2008; 09/2001   right   REVERSE SHOULDER ARTHROPLASTY Left 09/03/2017   Procedure: REVERSE SHOULDER ARTHROPLASTY;  Surgeon: Meredith Pel, MD;  Location: [place];  Service: Orthopedics;  Laterality: Left;   REVERSE SHOULDER ARTHROPLASTY Right 05/13/2018   Procedure: RIGHT REVERSE SHOULDER ARTHROPLASTY;  Surgeon: Meredith Pel, MD;  Location: [place];  Service: Orthopedics;  Laterality: Right;   rod placement Right 03/2009   R knee   SYNOVECTOMY  06/2005   debridement, liner exchange right knee   TEE WITHOUT CARDIOVERSION N/A 08/24/2022   Procedure: TRANSESOPHAGEAL ECHOCARDIOGRAM (TEE);  Surgeon: Minna Merritts, MD;  Location: [facility];  Service: Cardiovascular;  Laterality: N/A;   TOE AMPUTATION Left 08/2008   great toe - osteomyelitis (staph infection)   TOE AMPUTATION Left 04/2016   2nd toe [name]   TONSILLECTOMY     TOTAL KNEE ARTHROPLASTY Left 01/15/2017   TOTAL KNEE ARTHROPLASTY Left 01/15/2017   Procedure: TOTAL KNEE ARTHROPLASTY;  Surgeon: Meredith Pel, MD;  Location: [place];  Service: Orthopedics;  Laterality: Left;   Social History:   reports that he has never smoked. He has been exposed to tobacco smoke. He has never used smokeless tobacco. He reports that he does not drink alcohol and does not use drugs.  Allergies  Allergen Reactions   Peanut-Containing Drug Products Anaphylaxis and Dermatitis   Yellow Jacket Venom [Bee Venom] Anaphylaxis and Other (See Comments)    Respiratory Distress   Celebrex [Celecoxib] Other (See Comments)    BLACK STOOL?MELENA?   Percocet [Oxycodone-Acetaminophen] Hives, Swelling and Other (See Comments)   Latex Rash   Nsaids Rash    Family History  Problem Relation Age of Onset   Coronary artery disease Father 82   Stroke Father    CAD Mother 74  stents   Breast cancer Sister    Diabetes Brother    Diabetes Sister    Colon cancer Neg Hx    Stomach cancer Neg Hx     Prior to Admission medications   Medication Sig Start Date End Date Taking? Authorizing Provider  acetaminophen (TYLENOL) 500 MG tablet Take 1,000 mg by mouth 3 (three) times daily.    [provider]  amitriptyline (ELAVIL) 50 MG tablet Take 1 tablet (50 mg total) by mouth at bedtime. 10/30/22   [name], MD  apixaban (ELIQUIS) 5 MG TABS tablet Take 1 tablet (5 mg total) by mouth 2 (two) times daily. 09/27/22   [name], MD  atorvastatin (LIPITOR) 20 MG tablet TAKE ONE TABLET BY MOUTH ONCE DAILY 02/21/22   [name], MD  B Complex Vitamins (B-COMPLEX/B-12 PO) Take 1 tablet by mouth daily.    [provider]  cetirizine (ZYRTEC) 10 MG tablet Take 1 tablet (10 mg total) by mouth daily. 07/25/22   Michela Pitcher, NP  Cholecalciferol (VITAMIN D3) 25 MCG (1000 UT) CAPS Take 1 capsule (1,000 Units total) by mouth daily. 03/24/21   [name], MD  clotrimazole (LOTRIMIN) 1 % cream Apply 1 Application topically 2 (two) times daily as needed (rash). Patient not taking: Reported on 10/30/2022 08/28/22   Jennye Boroughs, MD  divalproex (DEPAKOTE ER) 500 MG 24 hr tablet TAKE ONE  TABLET BY MOUTH EVERYDAY AT BEDTIME 06/20/22   Suzzanne Cloud, NP  EPINEPHrine 0.3 mg/0.3 mL IJ SOAJ injection Inject 0.3 mg into the muscle as needed for anaphylaxis. (bee stings) 12/18/21   [name], MD  ferrous sulfate 325 (65 FE) MG EC tablet Take 325 mg by mouth daily with breakfast.    [provider]  fluticasone (FLONASE) 50 MCG/ACT nasal spray Place 2 sprays into both nostrils daily. 07/25/22   Michela Pitcher, NP  Fremanezumab-vfrm (AJOVY) 225 MG/1.5ML SOAJ Inject 225 mg into the skin every 30 (thirty) days. Patient not taking: Reported on 11/01/2022 07/24/22   Suzzanne Cloud, NP  furosemide (LASIX) 20 MG tablet TAKE ONE TABLET BY MOUTH EVERY MORNING 05/23/22   [name], MD  gabapentin (NEURONTIN) 300 MG capsule TAKE ONE CAPSULE BY MOUTH EVERY MORNING and TAKE TWO CAPSULES BY MOUTH AT NOON and TAKE TWO CAPSULES BY MOUTH EVERY EVENING 08/16/22   [name], MD  HYDROcodone-acetaminophen (NORCO/VICODIN) 5-325 MG tablet Take 1 tablet by mouth every 6 (six) hours as needed for moderate pain.    [provider]  levothyroxine (SYNTHROID) 112 MCG tablet Take 1 tablet (112 mcg total) by mouth daily before breakfast. 02/24/22   [name], MD  metoprolol succinate (TOPROL-XL) 100 MG 24 hr tablet Take 1 tablet (100 mg total) by mouth at bedtime. Take with or immediately following a meal. 08/28/22   Jennye Boroughs, MD  nitroGLYCERIN (NITROSTAT) 0.4 MG SL tablet Place 1 tablet (0.4 mg total) under the tongue every 5 (five) minutes as needed for chest pain. 11/27/21   [name], MD  nystatin (MYCOSTATIN) 100000 UNIT/ML suspension Take 5 mLs (500,000 Units total) by mouth 4 (four) times daily. 09/26/22   [name], MD  ondansetron (ZOFRAN-ODT) 4 MG disintegrating tablet TAKE ONE TABLET BY MOUTH every EIGHT hours AS NEEDED 10/04/22   Suzzanne Cloud, NP  pantoprazole (PROTONIX) 40 MG tablet Take 40 mg by mouth every other day. 03/01/22   [name], MD  tamsulosin (FLOMAX) 0.4 MG CAPS capsule TAKE ONE CAPSULE BY MOUTH  ONCE DAILY 10/22/22   [name], MD  tiZANidine (ZANAFLEX) 4 MG tablet TAKE ONE TABLET BY MOUTH every SIX hours AS NEEDED FOR acute HEADACHE 10/04/22   Suzzanne Cloud, NP  Ubrogepant (UBRELVY) 100 MG TABS Take 100 mg by mouth as needed (take 1 tablet at onset of headache, may repeat in hours, max is 200 mg in 24 hours). Patient taking differently: Take 100 mg by mouth as needed (Give 1 tablet by mouth every 12 hours as needed for headache). 07/24/22   Suzzanne Cloud, NP    Physical Exam: Vitals:   11/05/22 2151 11/05/22 2156 11/06/22 0001  BP: (!) 100/51  114/67  Pulse: 76  98  Resp: 18  20  Temp: 98.4 F (36.9 C)  (!) 101.4 F (38.6 C)  TempSrc: Oral  Oral  SpO2: 98%  95%  Weight:  99.3 kg   Height:  '5\' 8"'$  (1.727 m)    Physical Exam Vitals and nursing note reviewed.  Constitutional:      General: He is sleeping. He is not in acute distress.    Comments: Deconditioned appearing  HENT:     Head: Normocephalic and atraumatic.  Cardiovascular:     Rate and Rhythm: Normal rate and regular rhythm.     Heart sounds: Normal heart sounds.  Pulmonary:     Effort: Pulmonary effort is normal.     Breath sounds: Normal breath sounds.  Abdominal:     Palpations: Abdomen is soft.     Tenderness: There is no abdominal tenderness.  Neurological:     Mental Status: He is easily aroused. Mental status is at baseline. He is lethargic.     Labs on Admission: I have personally reviewed following labs and imaging studies  CBC: Recent Labs  Lab 11/05/22 2201  WBC 21.3*  NEUTROABS 17.0*  HGB 12.8*  HCT 40.3  MCV 97.3  PLT 382   Basic Metabolic Panel: Recent Labs  Lab 11/05/22 2201  NA 139  K 3.5  CL 100  CO2 29  GLUCOSE 126*  BUN 20  CREATININE 1.06  CALCIUM 8.3*   GFR: Estimated Creatinine Clearance: 73.1 mL/min (by C-G formula based on SCr of 1.06 mg/dL). Liver Function Tests: Recent  Labs  Lab 11/05/22 2201  AST 21  ALT 8  ALKPHOS 78  BILITOT 2.1*  PROT 6.9  ALBUMIN 2.7*   No results for input(s): "LIPASE", "AMYLASE" in the last 168 hours. No results for input(s): "AMMONIA" in the last 168 hours. Coagulation Profile: No results for input(s): "INR", "PROTIME" in the last 168 hours. Cardiac Enzymes: No results for input(s): "CKTOTAL", "CKMB", "CKMBINDEX", "TROPONINI" in the last 168 hours. BNP (last 3 results) No results for input(s): "PROBNP" in the last 8760 hours. HbA1C: No results for input(s): "HGBA1C" in the last 72 hours. CBG: No results for input(s): "GLUCAP" in the last 168 hours. Lipid Profile: No results for input(s): "CHOL", "HDL", "LDLCALC", "TRIG", "CHOLHDL", "LDLDIRECT" in the last 72 hours. Thyroid Function Tests: No results for input(s): "TSH", "T4TOTAL", "FREET4", "T3FREE", "THYROIDAB" in the last 72 hours. Anemia Panel: No results for input(s): "VITAMINB12", "FOLATE", "FERRITIN", "TIBC", "IRON", "RETICCTPCT" in the last 72 hours. Urine analysis:    Component Value Date/Time   COLORURINE YELLOW 10/05/2022 0324   APPEARANCEUR CLEAR 10/05/2022 0324   LABSPEC 1.017 10/05/2022 0324   PHURINE 6.0 10/05/2022 0324   GLUCOSEU NEGATIVE 10/05/2022 0324   HGBUR SMALL (A) 10/05/2022 0324   BILIRUBINUR NEGATIVE 10/05/2022 0324   KETONESUR NEGATIVE  10/05/2022 0324   PROTEINUR NEGATIVE 10/05/2022 0324   UROBILINOGEN 0.2 07/14/2009 1503   NITRITE NEGATIVE 10/05/2022 0324   LEUKOCYTESUR NEGATIVE 10/05/2022 0324    Radiological Exams on Admission: DG Chest Port 1 View  Result Date: 11/05/2022 CLINICAL DATA:  Cough. EXAM: PORTABLE CHEST 1 VIEW COMPARISON:  Chest radiograph dated 10/17/2022. FINDINGS: Shallow inspiration. Diffuse chronic short coarsening. No focal consolidation, pleural effusion, or pneumothorax. Stable cardiac silhouette. No acute osseous pathology. Bilateral shoulder arthroplasties. IMPRESSION: No active disease. Electronically  Signed   By: Anner Crete M.D.   On: 11/05/2022 23:06   CT Cervical Spine Wo Contrast  Result Date: 11/05/2022 CLINICAL DATA:  Acute posterior neck pain, fall EXAM: CT CERVICAL SPINE WITHOUT CONTRAST TECHNIQUE: Multidetector CT imaging of the cervical spine was performed without intravenous contrast. Multiplanar CT image reconstructions were also generated. RADIATION DOSE REDUCTION: This exam was performed according to the departmental dose-optimization program which includes automated exposure control, adjustment of the mA and/or kV according to patient size and/or use of iterative reconstruction technique. COMPARISON:  None Available. FINDINGS: Alignment: Normal alignment.  No listhesis Skull base and vertebrae: Craniocervical alignment is normal. Atlantodental interval is not widened. No acute fracture of the cervical spine. Vertebral body height is preserved. Ankylosis of the C4-5 facet joints bilaterally. Soft tissues and spinal canal: No prevertebral fluid or swelling. No visible canal hematoma. Posterior disc osteophyte complex at C6-7 results in mild central canal stenosis with abutment and mild flattening of the thecal sac. AP diameter of the spinal canal at this level is approximately 7-8 mm. Milder changes are noted at C5-6. Disc levels: There is intervertebral disc space narrowing and endplate remodeling at F8-H8 in keeping with changes of advanced degenerative disc disease. Milder degenerative changes are seen elsewhere within the cervical spine. Prevertebral soft tissues are not thickened on sagittal reformats. Multilevel uncovertebral and facet arthrosis results in multilevel moderate to severe neuroforaminal narrowing, most severe bilaterally at C3-4, C5-6 and C6-7. Upper chest: Negative. Other: None IMPRESSION: 1. No acute fracture or listhesis of the cervical spine. 2. Advanced multilevel degenerative disc and degenerative joint disease resulting in multilevel moderate to severe  neuroforaminal narrowing, most severe bilaterally at C3-4, C5-6 and C6-7. 3. Mild central canal stenosis at C6-7. Electronically Signed   By: Fidela Salisbury M.D.   On: 11/05/2022 22:54   CT HEAD WO CONTRAST (5MM)  Result Date: 11/05/2022 CLINICAL DATA:  Mental status change, fall. EXAM: CT HEAD WITHOUT CONTRAST TECHNIQUE: Contiguous axial images were obtained from the base of the skull through the vertex without intravenous contrast. RADIATION DOSE REDUCTION: This exam was performed according to the departmental dose-optimization program which includes automated exposure control, adjustment of the mA and/or kV according to patient size and/or use of iterative reconstruction technique. COMPARISON:  CT head 10/17/2022 FINDINGS: Brain: No evidence of acute infarction, hemorrhage, hydrocephalus, extra-axial collection or mass lesion/mass effect. Vascular: No hyperdense vessel or unexpected calcification. Skull: Normal. Negative for fracture or focal lesion. Sinuses/Orbits: No acute finding. Other: None. IMPRESSION: No acute intracranial abnormality. Electronically Signed   By: Ronney Asters M.D.   On: 11/05/2022 22:46     Data Reviewed: Relevant notes from primary care and specialist visits, past discharge summaries as available in EHR, including Care Everywhere. Prior diagnostic testing as pertinent to current admission diagnoses Updated medications and problem lists for reconciliation ED course, including vitals, labs, imaging, treatment and response to treatment Triage notes, nursing and pharmacy notes and ED provider's notes Notable results as  noted in HPI   Assessment and Plan: * Acute meningitis Suspect acute meningitis Continue Rocephin, vancomycin and acyclovir IR consult for LP Keep n.p.o. overnight Hold Eliquis Pain control  Sepsis ([place]) History of MSSA bacteremia of unknown source November 2023.  TEE negative for vegetations Sepsis criteria includes fever, tachycardia, borderline  blood pressure, leukocytosis Patient with neck pain and headache albeit with history of chronic headaches Has a cough but with negative viral panel and negative chest x-ray Sepsis fluids Will treat empirically for meningitis as outlined below Follow-up urinalysis to evaluate for UTI--> UA consistent with UTI Follow blood cultures  Acute metabolic encephalopathy Secondary to sepsis.  Patient was noted to be confused Neurochecks and aspiration and fall precautions  Abnormal urinalysis, possible UTI Urinalysis consistent with UTI Patient already on Rocephin for possible meningitis Follow cultures  Chronic diastolic CHF (congestive heart failure) (HCC) Clinically euvolemic Soft blood pressures so we will hold metoprolol and furosemide overnight in the setting of sepsis Daily weights and monitor for fluid overload with sepsis fluid hydration  Hypothyroidism Continue levothyroxine  Frailty Patient with multiple recent hospitalizations, frequent falls, recently discharged from SNF on 1/10, high readmission risk, lives alone PT and [facility] consult  Atrial fibrillation ([place]) Chronic anticoagulation Holding Eliquis for spinal tap in the a.m. Holding metoprolol due to soft blood pressures  Seizure disorder (HCC) Continue Depakote  Chronic headache Patient was recently switched from [place] to [place] Will hold both agents for now Can consider neurology consult        DVT prophylaxis: SCD  Consults: IR consult for spinal tap  Advance Care Planning:   Code Status: Prior   Family Communication: none  Disposition Plan: Back to previous home environment  Severity of Illness: The appropriate patient status for this patient is INPATIENT. Inpatient status is judged to be reasonable and necessary in order to provide the required intensity of service to ensure the patient's safety. The patient's presenting symptoms, physical exam findings, and initial radiographic and laboratory data  in the context of their chronic comorbidities is felt to place them at high risk for further clinical deterioration. Furthermore, it is not anticipated that the patient will be medically stable for discharge from the hospital within 2 midnights of admission.   * I certify that at the point of admission it is my clinical judgment that the patient will require inpatient hospital care spanning beyond 2 midnights from the point of admission due to high intensity of service, high risk for further deterioration and high frequency of surveillance required.*  Author: Athena Masse, MD 11/06/2022 1:31 AM  For on call review [email or web address].

## 2022-11-06 NOTE — Assessment & Plan Note (Signed)
Clinically euvolemic Soft blood pressures so we will hold metoprolol and furosemide overnight in the setting of sepsis Daily weights and monitor for fluid overload with sepsis fluid hydration

## 2022-11-06 NOTE — ED Provider Notes (Signed)
[facility] Provider Note    Event Date/Time   First MD Initiated Contact with Patient 11/06/22 0045     (approximate)   History   Fall and Altered Mental Status   HPI  Nicholas [name] is a 72 y.o. male with history of CHF, chronic kidney disease, migraines, memory difficulty, seizure disorder, atrial fibrillation on Eliquis, failure to thrive who presents to the emergency department with altered mental status.  Complaining of posterior headache and severe neck pain that started today when he woke up.  States he cannot bend his neck, turn his neck.  He denies any known injury.  No numbness or weakness.  Denies chest pain, shortness of breath, cough, vomiting, diarrhea, dysuria.  States he lives at home alone.   History provided by patient but level 5 caveat due to confusion.    Past Medical History:  Diagnosis Date   Acute kidney failure 08/2008   "cleared up"  no problems since   Allergy    Anxiety    Arthritis    Asthma    ?of this no inhaler   Blood transfusion    CHF (congestive heart failure) ([place])    ?of this, pt denies   CLOSTRIDIUM DIFFICILE COLITIS 07/04/2010   Annotation: 12/09, 2/10 Qualifier: Diagnosis of  By: [name], [name]     COVID-19 08/12/22 completed Paxlovid 08/20/22 08/26/2022   Depression with anxiety    Duodenitis determined by biopsy 02/2016   peptic likely due to aleve (erosive gastropathy with duodenal erosions)   ECZEMA 07/04/2010   Qualifier: Diagnosis of  By: [name], [name]     Elevated liver enzymes    Fatty liver    Full dentures    GERD (gastroesophageal reflux disease)    Heart attack ([place])    08/2008 (likely demand ischemia in the setting of MSSA sepsis/R TKA  infection)10-2008   History of hiatal hernia    History of stomach ulcers    Hypothyroidism    Lower GI bleeding    MALAR AND MAXILLARY BONES CLOSED FRACTURE 07/04/2010   Annotation: ORIF Qualifier: Diagnosis of  By: [name], [name]      Migraine    "definitely"   MRSA (methicillin resistant Staphylococcus aureus)    in leg, had to place steel rod in leg   Personal history of colonic adenoma 06/01/2003   PFO (patent foramen ovale)    small PFO by 08/2008 TEE   Pneumonia 08/2008   "while in ICU"   Pneumonia due to COVID-19 virus 08/12/2022   Seizures ([place]) 2009   "long time ago"     Sleep apnea    does not wear CPAP   Stroke ([place]) 2010   unable to complete sentences at times   Wears glasses     Past Surgical History:  Procedure Laterality Date   AMPUTATION Left 01/24/2022   Procedure: LEFT GREAT TOE AMPUTATION AT METATARSOPHALANGEAL JOINT AND SECOND TOE AMPUTATION;  Surgeon: Newt Minion, MD;  Location: [place];  Service: Orthopedics;  Laterality: Left;   anterior  nerve transposition  07/2009   left ulnar nerve   antibiotic spacer exchange  11/2008; 08/2006   right knee   ARTHROTOMY  08/2008   right knee w/I&D   CARDIOVASCULAR STRESS TEST  2013   stress EKG - negative for ischemia, 4 min 7.3 METs, normal blood pressure response   CARDIOVERSION N/A 08/24/2022   Procedure: CARDIOVERSION;  Surgeon: Minna Merritts, MD;  Location: [facility];  Service: Cardiovascular;  Laterality: N/A;   CATARACT EXTRACTION     COLONOSCOPY  11/2012   diverticulosis, rpt 10 yrs [name])   ESOPHAGOGASTRODUODENOSCOPY  02/2016   erosive gastropathy with duodenal erosions [name])   HARDWARE REMOVAL  04/2006   right knee w/antibiotic spacers placed   INGUINAL HERNIA REPAIR  early 1990's   bilateral   JOINT REPLACEMENT Bilateral    KNEE ARTHROSCOPY  10/2001   right   KNEE FUSION  03/2009   right knee removal; antibiotic spacers;    LEFT HEART CATH AND CORONARY ANGIOGRAPHY N/A 02/04/2018    WNL (Patwardhan, Reynold Bowen, MD)   LUMBAR LAMINECTOMY/DECOMPRESSION MICRODISCECTOMY N/A 12/05/2018   Procedure: LUMBAR THREE TO FOUR AND LUMBAR FOUR TO FIVE DECOMPRESSION;  Surgeon: [name], MD;  Location: [place];  Service: Orthopedics;   Laterality: N/A;   MULTIPLE TOOTH EXTRACTIONS     REPLACEMENT TOTAL KNEE  08/2008; 09/2001   right   REVERSE SHOULDER ARTHROPLASTY Left 09/03/2017   Procedure: REVERSE SHOULDER ARTHROPLASTY;  Surgeon: Meredith Pel, MD;  Location: [place];  Service: Orthopedics;  Laterality: Left;   REVERSE SHOULDER ARTHROPLASTY Right 05/13/2018   Procedure: RIGHT REVERSE SHOULDER ARTHROPLASTY;  Surgeon: Meredith Pel, MD;  Location: [place];  Service: Orthopedics;  Laterality: Right;   rod placement Right 03/2009   R knee   SYNOVECTOMY  06/2005   debridement, liner exchange right knee   TEE WITHOUT CARDIOVERSION N/A 08/24/2022   Procedure: TRANSESOPHAGEAL ECHOCARDIOGRAM (TEE);  Surgeon: Minna Merritts, MD;  Location: [facility];  Service: Cardiovascular;  Laterality: N/A;   TOE AMPUTATION Left 08/2008   great toe - osteomyelitis (staph infection)   TOE AMPUTATION Left 04/2016   2nd toe [name]   TONSILLECTOMY     TOTAL KNEE ARTHROPLASTY Left 01/15/2017   TOTAL KNEE ARTHROPLASTY Left 01/15/2017   Procedure: TOTAL KNEE ARTHROPLASTY;  Surgeon: Meredith Pel, MD;  Location: [place];  Service: Orthopedics;  Laterality: Left;    MEDICATIONS:  Prior to Admission medications   Medication Sig Start Date End Date Taking? Authorizing Provider  acetaminophen (TYLENOL) 500 MG tablet Take 1,000 mg by mouth 3 (three) times daily.    [provider]  amitriptyline (ELAVIL) 50 MG tablet Take 1 tablet (50 mg total) by mouth at bedtime. 10/30/22   [name], MD  apixaban (ELIQUIS) 5 MG TABS tablet Take 1 tablet (5 mg total) by mouth 2 (two) times daily. 09/27/22   [name], MD  atorvastatin (LIPITOR) 20 MG tablet TAKE ONE TABLET BY MOUTH ONCE DAILY 02/21/22   [name], MD  B Complex Vitamins (B-COMPLEX/B-12 PO) Take 1 tablet by mouth daily.    [provider]  cetirizine (ZYRTEC) 10 MG tablet Take 1 tablet (10 mg total) by mouth daily. 07/25/22   Michela Pitcher, NP   Cholecalciferol (VITAMIN D3) 25 MCG (1000 UT) CAPS Take 1 capsule (1,000 Units total) by mouth daily. 03/24/21   [name], MD  clotrimazole (LOTRIMIN) 1 % cream Apply 1 Application topically 2 (two) times daily as needed (rash). Patient not taking: Reported on 10/30/2022 08/28/22   Jennye Boroughs, MD  divalproex (DEPAKOTE ER) 500 MG 24 hr tablet TAKE ONE TABLET BY MOUTH EVERYDAY AT BEDTIME 06/20/22   Suzzanne Cloud, NP  EPINEPHrine 0.3 mg/0.3 mL IJ SOAJ injection Inject 0.3 mg into the muscle as needed for anaphylaxis. (bee stings) 12/18/21   [name], MD  ferrous sulfate 325 (65 FE) MG EC tablet Take 325  mg by mouth daily with breakfast.    [provider]  fluticasone (FLONASE) 50 MCG/ACT nasal spray Place 2 sprays into both nostrils daily. 07/25/22   Michela Pitcher, NP  Fremanezumab-vfrm (AJOVY) 225 MG/1.5ML SOAJ Inject 225 mg into the skin every 30 (thirty) days. Patient not taking: Reported on 11/01/2022 07/24/22   Suzzanne Cloud, NP  furosemide (LASIX) 20 MG tablet TAKE ONE TABLET BY MOUTH EVERY MORNING 05/23/22   [name], MD  gabapentin (NEURONTIN) 300 MG capsule TAKE ONE CAPSULE BY MOUTH EVERY MORNING and TAKE TWO CAPSULES BY MOUTH AT NOON and TAKE TWO CAPSULES BY MOUTH EVERY EVENING 08/16/22   [name], MD  HYDROcodone-acetaminophen (NORCO/VICODIN) 5-325 MG tablet Take 1 tablet by mouth every 6 (six) hours as needed for moderate pain.    [provider]  levothyroxine (SYNTHROID) 112 MCG tablet Take 1 tablet (112 mcg total) by mouth daily before breakfast. 02/24/22   [name], MD  metoprolol succinate (TOPROL-XL) 100 MG 24 hr tablet Take 1 tablet (100 mg total) by mouth at bedtime. Take with or immediately following a meal. 08/28/22   Jennye Boroughs, MD  nitroGLYCERIN (NITROSTAT) 0.4 MG SL tablet Place 1 tablet (0.4 mg total) under the tongue every 5 (five) minutes as needed for chest pain. 11/27/21   [name], MD  nystatin  (MYCOSTATIN) 100000 UNIT/ML suspension Take 5 mLs (500,000 Units total) by mouth 4 (four) times daily. 09/26/22   [name], MD  ondansetron (ZOFRAN-ODT) 4 MG disintegrating tablet TAKE ONE TABLET BY MOUTH every EIGHT hours AS NEEDED 10/04/22   Suzzanne Cloud, NP  pantoprazole (PROTONIX) 40 MG tablet Take 40 mg by mouth every other day. 03/01/22   [name], MD  tamsulosin (FLOMAX) 0.4 MG CAPS capsule TAKE ONE CAPSULE BY MOUTH ONCE DAILY 10/22/22   [name], MD  tiZANidine (ZANAFLEX) 4 MG tablet TAKE ONE TABLET BY MOUTH every SIX hours AS NEEDED FOR acute HEADACHE 10/04/22   Suzzanne Cloud, NP  Ubrogepant (UBRELVY) 100 MG TABS Take 100 mg by mouth as needed (take 1 tablet at onset of headache, may repeat in hours, max is 200 mg in 24 hours). Patient taking differently: Take 100 mg by mouth as needed (Give 1 tablet by mouth every 12 hours as needed for headache). 07/24/22   Suzzanne Cloud, NP    Physical Exam   Triage Vital Signs: ED Triage Vitals  Enc Vitals Group     BP 11/05/22 2151 (!) 100/51     Pulse Rate 11/05/22 2151 76     Resp 11/05/22 2151 18     Temp 11/05/22 2151 98.4 F (36.9 C)     Temp Source 11/05/22 2151 Oral     SpO2 11/05/22 2151 98 %     Weight 11/05/22 2156 218 lb 14.7 oz (99.3 kg)     Height 11/05/22 2156 '5\' 8"'$  (1.727 m)     Head Circumference --      Peak Flow --      Pain Score 11/05/22 2153 8     Pain Loc --      Pain Edu? --      Excl. in [place]? --     Most recent vital signs: Vitals:   11/06/22 0242 11/06/22 0243  BP: 95/68   Pulse: 85   Resp: (!) 22   Temp: 97.9 F (36.6 C)   SpO2: (!) 88% 95%    CONSTITUTIONAL: Alert, responds appropriately to questions appears intermittently confused.  Elderly.  Nontoxic in appearance. HEAD: Normocephalic, atraumatic EYES: Conjunctivae clear, pupils appear equal, sclera nonicteric ENT: normal nose; moist mucous membranes NECK: Supple, normal ROM, no midline spinal tenderness, step-off or  deformity but patient does have meningismus on exam and pain with turning his neck to the right or left CARD: Regular and tachycardic; S1 and S2 appreciated RESP: Normal chest excursion without splinting or tachypnea; breath sounds clear and equal bilaterally; no wheezes, no rhonchi, no rales, no hypoxia or respiratory distress, speaking full sentences ABD/GI: Non-distended; soft, non-tender, no rebound, no guarding, no peritoneal signs BACK: The back appears normal EXT: Normal ROM in all joints; no deformity noted, no edema SKIN: Normal color for age and race; warm; no rash on exposed skin NEURO: Moves all extremities equally, normal speech, denies sensory changes PSYCH: The patient's mood and manner are appropriate.   ED Results / Procedures / Treatments   LABS: (all labs ordered are listed, but only abnormal results are displayed) Labs Reviewed  CBC WITH DIFFERENTIAL/PLATELET - Abnormal; Notable for the following components:      Result Value   WBC 21.3 (*)    RBC 4.14 (*)    Hemoglobin 12.8 (*)    Neutro Abs 17.0 (*)    Monocytes Absolute 3.4 (*)    Abs Immature Granulocytes 0.09 (*)    All other components within normal limits  COMPREHENSIVE METABOLIC PANEL - Abnormal; Notable for the following components:   Glucose, Bld 126 (*)    Calcium 8.3 (*)    Albumin 2.7 (*)    Total Bilirubin 2.1 (*)    All other components within normal limits  URINALYSIS, ROUTINE W REFLEX MICROSCOPIC - Abnormal; Notable for the following components:   Color, Urine AMBER (*)    APPearance HAZY (*)    Hgb urine dipstick MODERATE (*)    Protein, ur 30 (*)    Nitrite POSITIVE (*)    Leukocytes,Ua MODERATE (*)    WBC, UA >50 (*)    Bacteria, UA RARE (*)    All other components within normal limits  RESP PANEL BY RT-PCR (RSV, FLU A&B, COVID)  RVPGX2  CULTURE, BLOOD (ROUTINE X 2)  CULTURE, BLOOD (ROUTINE X 2)  URINE CULTURE  PROCALCITONIN  LACTIC ACID, PLASMA  LACTIC ACID, PLASMA  PROTIME-INR   VALPROIC ACID LEVEL  CORTISOL-AM, BLOOD  BASIC METABOLIC PANEL  CBC  TROPONIN I (HIGH SENSITIVITY)  TROPONIN I (HIGH SENSITIVITY)     EKG:  EKG Interpretation  Date/Time:  Monday November 05 2022 22:31:17 EST Ventricular Rate:  102 PR Interval:  162 QRS Duration: 86 QT Interval:  334 QTC Calculation: 435 R Axis:   148 Text Interpretation: Sinus tachycardia Right axis deviation Low voltage QRS Cannot rule out Anterior infarct (cited on or before 05-Nov-2022) Abnormal ECG When compared with ECG of 05-Nov-2022 22:30, (unconfirmed) Premature ventricular complexes are no longer Present Premature supraventricular complexes are no longer Present Sinus rhythm is no longer with junctional escape complexes Nonspecific T wave abnormality, improved in Lateral leads Confirmed by Pryor Curia [phone]) on 11/06/2022 1:26:47 AM         RADIOLOGY: My personal review and interpretation of imaging: Chest x-ray clear.  CT head and cervical spine unremarkable.  I have personally reviewed all radiology reports.   DG Chest Port 1 View  Result Date: 11/05/2022 CLINICAL DATA:  Cough. EXAM: PORTABLE CHEST 1 VIEW COMPARISON:  Chest radiograph dated 10/17/2022. FINDINGS: Shallow inspiration. Diffuse chronic short coarsening. No focal consolidation, pleural effusion,  or pneumothorax. Stable cardiac silhouette. No acute osseous pathology. Bilateral shoulder arthroplasties. IMPRESSION: No active disease. Electronically Signed   By: Anner Crete M.D.   On: 11/05/2022 23:06   CT Cervical Spine Wo Contrast  Result Date: 11/05/2022 CLINICAL DATA:  Acute posterior neck pain, fall EXAM: CT CERVICAL SPINE WITHOUT CONTRAST TECHNIQUE: Multidetector CT imaging of the cervical spine was performed without intravenous contrast. Multiplanar CT image reconstructions were also generated. RADIATION DOSE REDUCTION: This exam was performed according to the departmental dose-optimization program which includes automated  exposure control, adjustment of the mA and/or kV according to patient size and/or use of iterative reconstruction technique. COMPARISON:  None Available. FINDINGS: Alignment: Normal alignment.  No listhesis Skull base and vertebrae: Craniocervical alignment is normal. Atlantodental interval is not widened. No acute fracture of the cervical spine. Vertebral body height is preserved. Ankylosis of the C4-5 facet joints bilaterally. Soft tissues and spinal canal: No prevertebral fluid or swelling. No visible canal hematoma. Posterior disc osteophyte complex at C6-7 results in mild central canal stenosis with abutment and mild flattening of the thecal sac. AP diameter of the spinal canal at this level is approximately 7-8 mm. Milder changes are noted at C5-6. Disc levels: There is intervertebral disc space narrowing and endplate remodeling at P9-X5 in keeping with changes of advanced degenerative disc disease. Milder degenerative changes are seen elsewhere within the cervical spine. Prevertebral soft tissues are not thickened on sagittal reformats. Multilevel uncovertebral and facet arthrosis results in multilevel moderate to severe neuroforaminal narrowing, most severe bilaterally at C3-4, C5-6 and C6-7. Upper chest: Negative. Other: None IMPRESSION: 1. No acute fracture or listhesis of the cervical spine. 2. Advanced multilevel degenerative disc and degenerative joint disease resulting in multilevel moderate to severe neuroforaminal narrowing, most severe bilaterally at C3-4, C5-6 and C6-7. 3. Mild central canal stenosis at C6-7. Electronically Signed   By: Fidela Salisbury M.D.   On: 11/05/2022 22:54   CT HEAD WO CONTRAST (5MM)  Result Date: 11/05/2022 CLINICAL DATA:  Mental status change, fall. EXAM: CT HEAD WITHOUT CONTRAST TECHNIQUE: Contiguous axial images were obtained from the base of the skull through the vertex without intravenous contrast. RADIATION DOSE REDUCTION: This exam was performed according to the  departmental dose-optimization program which includes automated exposure control, adjustment of the mA and/or kV according to patient size and/or use of iterative reconstruction technique. COMPARISON:  CT head 10/17/2022 FINDINGS: Brain: No evidence of acute infarction, hemorrhage, hydrocephalus, extra-axial collection or mass lesion/mass effect. Vascular: No hyperdense vessel or unexpected calcification. Skull: Normal. Negative for fracture or focal lesion. Sinuses/Orbits: No acute finding. Other: None. IMPRESSION: No acute intracranial abnormality. Electronically Signed   By: Ronney Asters M.D.   On: 11/05/2022 22:46     PROCEDURES:  Critical Care performed: Yes, see critical care procedure note(s)   CRITICAL CARE Performed by: Cyril Mourning Lennon Boutwell   Total critical care time: 40 minutes  Critical care time was exclusive of separately billable procedures and treating other patients.  Critical care was necessary to treat or prevent imminent or life-threatening deterioration.  Critical care was time spent personally by me on the following activities: development of treatment plan with patient and/or surrogate as well as nursing, discussions with consultants, evaluation of patient's response to treatment, examination of patient, obtaining history from patient or surrogate, ordering and performing treatments and interventions, ordering and review of laboratory studies, ordering and review of radiographic studies, pulse oximetry and re-evaluation of patient's condition.   [name]1-3 Lead EKG Interpretation  Performed by: Karlin Heilman, Delice Bison, DO Authorized by: Deyani Hegarty, Delice Bison, DO     Interpretation: normal     ECG rate:  98   ECG rate assessment: normal     Rhythm: sinus rhythm     Ectopy: none     Conduction: normal       IMPRESSION / MDM / ASSESSMENT AND PLAN / ED COURSE  I reviewed the triage vital signs and the nursing notes.    Patient here with fever, headache, neck pain, altered mental  status.  The patient is on the cardiac monitor to evaluate for evidence of arrhythmia and/or significant heart rate changes.   DIFFERENTIAL DIAGNOSIS (includes but not limited to):   Meningitis, UTI, pneumonia, viral URI, sepsis, bacteremia, intracranial hemorrhage, CVA, encephalitis   Patient's presentation is most consistent with acute presentation with potential threat to life or bodily function.   PLAN: Workup initiated from triage.  Patient has fever of 101.4, leukocytosis of 21,000.  COVID, flu and RSV negative.  Will add on lactic, procalcitonin, cultures.  Chest x-ray, CT head and cervical spine reviewed and interpreted by myself and the radiologist and show no acute abnormalities.  I am concerned for possible meningitis, encephalitis.  Patient initially stated he was not on blood thinners but on review of his records it appears he is on Eliquis and this was listed as an active medication during his last visit with his primary care doctor in January 2024.  Will hold off on lumbar puncture due to risk of bleeding while on Eliquis.  Will cover with broad-spectrum antibiotics to cover for meningitis and acyclovir to cover for HSV.  Will give Decadron.  Will discuss with hospitalist for admission.  It does appear that he has history of chronic headaches and chronic memory difficulty.  Also appears he complained of neck pain during a recent visit in the [facility] emergency department when he was positive for influenza.  It is difficult to tell if any of this he is having today is acute and unfortunately there is no family at the bedside.  MEDICATIONS GIVEN IN ED: Medications  0.9 %  sodium chloride infusion ( Intravenous Not Given 11/06/22 0229)  vancomycin (VANCOREADY) IVPB 2000 mg/400 mL (2,000 mg Intravenous New Bag/Given 11/06/22 0242)  acyclovir (ZOVIRAX) 800 mg in dextrose 5 % 250 mL IVPB (has no administration in time range)  atorvastatin (LIPITOR) tablet 20 mg (has no administration in  time range)  amitriptyline (ELAVIL) tablet 50 mg (has no administration in time range)  levothyroxine (SYNTHROID) tablet 112 mcg (has no administration in time range)  pantoprazole (PROTONIX) EC tablet 40 mg (has no administration in time range)  tamsulosin (FLOMAX) capsule 0.4 mg (has no administration in time range)  divalproex (DEPAKOTE ER) 24 hr tablet 500 mg (has no administration in time range)  lactated ringers infusion ( Intravenous New Bag/Given 11/06/22 0242)  cefTRIAXone (ROCEPHIN) 2 g in sodium chloride 0.9 % 100 mL IVPB (has no administration in time range)  acetaminophen (TYLENOL) tablet 650 mg (has no administration in time range)    Or  acetaminophen (TYLENOL) suppository 650 mg (has no administration in time range)  HYDROcodone-acetaminophen (NORCO/VICODIN) 5-325 MG per tablet 1-2 tablet (has no administration in time range)  ondansetron (ZOFRAN) tablet 4 mg (has no administration in time range)    Or  ondansetron (ZOFRAN) injection 4 mg (has no administration in time range)  ampicillin (OMNIPEN) 2 g in sodium chloride 0.9 % 100 mL IVPB (has no  administration in time range)  vancomycin (VANCOCIN) IVPB 1000 mg/200 mL premix (has no administration in time range)  acyclovir (ZOVIRAX) 800 mg in dextrose 5 % 250 mL IVPB (has no administration in time range)  acetaminophen (TYLENOL) tablet 1,000 mg (1,000 mg Oral Given 11/06/22 0017)  cefTRIAXone (ROCEPHIN) 2 g in sodium chloride 0.9 % 100 mL IVPB (0 g Intravenous Stopped 11/06/22 0136)  fentaNYL (SUBLIMAZE) injection 50 mcg (50 mcg Intravenous Given 11/06/22 0107)  ondansetron (ZOFRAN) injection 4 mg (4 mg Intravenous Given 11/06/22 0107)  dexamethasone (DECADRON) injection 10 mg (10 mg Intravenous Given 11/06/22 0107)  sodium chloride 0.9 % bolus 1,000 mL (0 mLs Intravenous Stopped 11/06/22 0229)     ED COURSE: Lactic 1.3.  Procalcitonin 0.12.  Patient does have a UTI which could be the cause of her sepsis and confusion  today.   CONSULTS:  Consulted and discussed patient's case with hospitalist, Dr. Damita Dunnings.  I have recommended admission and consulting physician agrees and will place admission orders.  Patient (and family if present) agree with this plan.   I reviewed all nursing notes, vitals, pertinent previous records.  All labs, EKGs, imaging ordered have been independently reviewed and interpreted by myself.    OUTSIDE RECORDS REVIEWED: Reviewed last PCP visit with Dr. Danise Mina on 10/30/2022.       FINAL CLINICAL IMPRESSION(S) / ED DIAGNOSES   Final diagnoses:  Acute sepsis ([place])  Altered mental status, unspecified altered mental status type  Meningismus  Acute UTI     Rx / DC Orders   ED Discharge Orders     None        Note:  This document was prepared using Dragon voice recognition software and may include unintentional dictation errors.   Shakeita Vandevander, Delice Bison, DO 11/06/22 [phone]

## 2022-11-06 NOTE — Procedures (Signed)
Technically successful fluoro guided LP at L4-L5 level with opening pressure of 13 cm H2O. 8 cc of clear CSF sent to lab for analysis.  No immediate post procedural complication.  Please see imaging section of Epic for full dictation.    Reatha Armour, PA-C 11/06/2022, 12:54 PM

## 2022-11-06 NOTE — Assessment & Plan Note (Signed)
Urinalysis consistent with UTI Patient already on Rocephin for possible meningitis Follow cultures

## 2022-11-06 NOTE — Assessment & Plan Note (Signed)
Secondary to sepsis.  Patient was noted to be confused Neurochecks and aspiration and fall precautions

## 2022-11-06 NOTE — Progress Notes (Addendum)
CRITICAL VALUE STICKER  CRITICAL VALUE: lactic 4.0  RECEIVER (on-site recipient of call): Levada Schilling, RN  DATE & TIME NOTIFIED: 11/06/22 6:15 AM  MESSENGER (representative from lab): Lab  MD NOTIFIED: NP [place]   TIME OF NOTIFICATION: 6:16 AM  RESPONSE: IV fluid bolus

## 2022-11-06 NOTE — Assessment & Plan Note (Signed)
Continue levothyroxine 

## 2022-11-06 NOTE — Progress Notes (Signed)
CODE SEPSIS - PHARMACY COMMUNICATION  **Broad Spectrum Antibiotics should be administered within 1 hour of Sepsis diagnosis**  Time Code Sepsis Called/Page Received: 0101  Antibiotics Ordered: Ceftriaxone, Vancomycin, Acyclovir  Time of 1st antibiotic administration: 0106  Renda Rolls, PharmD, MBA 11/06/2022 1:05 AM

## 2022-11-06 NOTE — Assessment & Plan Note (Signed)
Patient with multiple recent hospitalizations, frequent falls, recently discharged from SNF on 1/10, high readmission risk, lives alone PT and [facility] consult

## 2022-11-07 ENCOUNTER — Other Ambulatory Visit: Payer: Self-pay

## 2022-11-07 DIAGNOSIS — G039 Meningitis, unspecified: Secondary | ICD-10-CM | POA: Diagnosis not present

## 2022-11-07 LAB — BASIC METABOLIC PANEL
Anion gap: 6 (ref 5–15)
BUN: 19 mg/dL (ref 8–23)
CO2: 26 mmol/L (ref 22–32)
Calcium: 7.5 mg/dL — ABNORMAL LOW (ref 8.9–10.3)
Chloride: 108 mmol/L (ref 98–111)
Creatinine, Ser: 0.79 mg/dL (ref 0.61–1.24)
GFR, Estimated: >60 mL/min (ref >60)
Glucose, Bld: 90 mg/dL (ref 70–99)
Potassium: 3 mmol/L — ABNORMAL LOW (ref 3.5–5.1)
Sodium: 140 mmol/L (ref 135–145)

## 2022-11-07 LAB — CBC
HCT: 29.7 % — ABNORMAL LOW (ref 39.0–52.0)
Hemoglobin: 9.4 g/dL — ABNORMAL LOW (ref 13.0–17.0)
MCH: 30.9 pg (ref 26.0–34.0)
MCHC: 31.6 g/dL (ref 30.0–36.0)
MCV: 97.7 fL (ref 80.0–100.0)
Platelets: 202 K/uL (ref 150–400)
RBC: 3.04 MIL/uL — ABNORMAL LOW (ref 4.22–5.81)
RDW: 15.2 % (ref 11.5–15.5)
WBC: 13.1 K/uL — ABNORMAL HIGH (ref 4.0–10.5)
nRBC: 0 % (ref 0.0–0.2)

## 2022-11-07 LAB — LACTIC ACID, PLASMA: Lactic Acid, Venous: 0.7 mmol/L (ref 0.5–1.9)

## 2022-11-07 LAB — MAGNESIUM: Magnesium: 1.7 mg/dL (ref 1.7–2.4)

## 2022-11-07 LAB — BRAIN NATRIURETIC PEPTIDE: B Natriuretic Peptide: 122.5 pg/mL — ABNORMAL HIGH (ref 0.0–100.0)

## 2022-11-07 LAB — PHOSPHORUS: Phosphorus: 2.9 mg/dL (ref 2.5–4.6)

## 2022-11-07 MED ORDER — SODIUM CHLORIDE 0.9 % IV SOLN
2.0000 g | Freq: Three times a day (TID) | INTRAVENOUS | Status: DC
  Administered 2022-11-07 – 2022-11-09 (×6): 2 g via INTRAVENOUS
  Filled 2022-11-07: qty 12.5
  Filled 2022-11-07: qty 2
  Filled 2022-11-07 (×6): qty 12.5

## 2022-11-07 MED ORDER — SODIUM CHLORIDE 0.9 % IV SOLN
2.0000 g | INTRAVENOUS | Status: DC
  Administered 2022-11-07: 2 g via INTRAVENOUS
  Filled 2022-11-07 (×2): qty 2000

## 2022-11-07 NOTE — ED Notes (Signed)
Pt currently sitting in recliner. Call light within reach.

## 2022-11-07 NOTE — TOC Initial Note (Signed)
Transition of Care [facility]) - Initial/Assessment Note    Patient Details  Name: Nicholas [name] MRN: [id number] Date of Birth: 03/16/1951  Transition of [facility]) CM/SW Contact:    Shelbie Hutching, RN Phone Number: 11/07/2022, 9:30 AM  Clinical Narrative:                 Patient admitted to the hospital with sepsis.  RNCM met with patient at the bedside in the emergency room yesterday, patient had a lumbar puncture earlier.  Patient is from home where he lives alone.  He has a son, [name] in [place].  Patient recently at [place].  He reports that he would like to go back to [place] at discharge and stay for about 3 months.  Patient is probably in copay days, RNCM asked if he could private pay and he said he would probably have to.   RNCM informed patient that we would work on discharge planning for SNF.  He reports that he will likely not ever be going back to his apartment.    Expected Discharge Plan: [place] Barriers to Discharge: Continued Medical Work up   Patient Goals and CMS Choice Patient states their goals for this hospitalization and ongoing recovery are:: patient would like to go back to [email or web address] Compare Post Acute Care list provided to:: Patient Choice offered to / list presented to : Patient      Expected Discharge Plan and Services   Discharge Planning Services: CM Consult Post Acute Care Choice: [place] arrangements for the past 2 months: Single Family Home                 DME Arranged: N/A DME Agency: NA       HH Arranged: NA HH Agency: NA        Prior Living Arrangements/Services Living arrangements for the past 2 months: Single Family Home Lives with:: Self Patient language and need for interpreter reviewed:: Yes Do you feel safe going back to the place where you live?: No   wants to go to rehab, thinks that he will probably not be going back home  Need for Family Participation in Patient  Care: Yes (Comment) Care giver support system in place?: Yes (comment) Current home services: DME Criminal Activity/Legal Involvement Pertinent to Current Situation/Hospitalization: No - Comment as needed  Activities of Daily Living      Permission Sought/Granted Permission sought to share information with : Facility Sport and exercise psychologist, Family Supports    Share Information with NAME: Bracen Schum  Permission granted to share info w AGENCY: [facility]  Permission granted to share info w Relationship: son  Permission granted to share info w Contact Information: [phone]  Emotional Assessment Appearance:: Appears stated age Attitude/Demeanor/Rapport: Engaged Affect (typically observed): Accepting Orientation: : Oriented to Self, Oriented to Place, Oriented to  Time, Oriented to Situation Alcohol / Substance Use: Not Applicable Psych Involvement: No (comment)  Admission diagnosis:  Acute meningitis [G03.9] Sepsis ([place]) [A41.9] Patient Active Problem List   Diagnosis Date Noted   Acute meningitis 11/06/2022   Chronic anticoagulation 11/06/2022   Frailty 11/06/2022   Abnormal urinalysis, possible UTI 11/06/2022   FTT (failure to thrive) in adult 11/01/2022   UTI (urinary tract infection) 09/27/2022   Petechial rash 09/27/2022   Sepsis ([place]) 08/23/2022   MSSA bacteremia 08/16/2022 08/18/2022   Seizure disorder ([place]) 08/17/2022   Atrial fibrillation ([place]) 08/17/2022   Acute urinary retention 24/82/5003   Acute metabolic encephalopathy 70/48/8891  PND (post-nasal drip) 07/25/2022   Oral thrush 07/15/2022   Open wound of foot with complication, left, initial encounter 06/16/2022   Hx of excision of lamina of lumbar vertebra for decompression of spinal cord 03/20/2022   Peripheral edema 02/28/2022   Recurrent major depressive disorder ([place]) 02/28/2022   Recurrent falls 08/25/2021   Memory difficulty 07/04/2021   Chronic toe ulcer, left, with fat layer exposed ([place])  01/23/2021   Pituitary adenoma ([place]) 09/06/2020   Chronic headache 07/27/2020   Vitamin B12 deficiency 03/31/2020   Vitamin D deficiency 03/31/2020   Numbness of foot 03/25/2020   Status post amputation of great toe, left ([place]) 12/14/2019   Callus of foot 04/02/2019   Allergy to bee sting 03/11/2019   Insomnia 02/10/2019   Status post lumbar spine operative procedure for decompression of spinal cord 12/12/2018   Dyslipidemia 10/19/2018   Foot deformity 10/19/2018   Status post reverse total replacement of right shoulder 05/13/2018   Chronic diastolic CHF (congestive heart failure) ([place]) 12/27/2017   OSA (obstructive sleep apnea) 12/27/2017   CKD (chronic kidney disease) stage 3, GFR 30-59 ml/min (HCC) 10/01/2017   Fatty liver 10/01/2017   Status post reverse total replacement of left shoulder 09/03/2017   Bilateral shoulder pain 05/24/2017   History of CVA in adulthood 03/08/2017   S/P total knee arthroplasty, left 01/30/2017   Osteoarthritis of knees, bilateral 01/15/2017   Obesity, Class I, BMI 30-34.9 07/19/2016   Hypothyroidism    Iron deficiency 04/23/2016   Chronic pain syndrome 04/23/2016   Inflammatory arthritis 04/23/2016   Chronic leg pain 04/23/2016   Lesion of palate 04/23/2016   GERD (gastroesophageal reflux disease)    Duodenitis determined by biopsy 02/13/2016   Diverticulosis of colon without hemorrhage 01/31/2016   Migraine 01/11/2012   History of methicillin resistant staphylococcus aureus (MRSA) 07/04/2010   DDD (degenerative disc disease), lumbar 07/04/2010   History of total right knee replacement 07/04/2010   Contact dermatitis 07/04/2010   Lesion of ulnar nerve 07/04/2010   Closed fracture of malar bone ([place]) 07/04/2010   Narrowing of intervertebral disc space 07/04/2010   Olecranon bursitis 07/04/2010   History of artificial joint 07/04/2010   Primary osteoarthritis, right shoulder 06/09/2007   Osteoarthritis 06/09/2007   Personal history of  colonic adenoma 06/01/2003   PCP:  [name], MD Pharmacy:   [facility] - [place], [place] - [address] [place] [address] Phone: [phone] Fax: [phone]     Social Determinants of Health (SDOH) Social History: SDOH Screenings   Food Insecurity: No Food Insecurity (10/01/2022)  Housing: Low Risk  (10/01/2022)  Transportation Needs: No Transportation Needs (10/01/2022)  Utilities: Not At Risk (08/27/2022)  Alcohol Screen: Low Risk  (02/28/2022)  Depression (PHQ2-9): Medium Risk (10/30/2022)  Financial Resource Strain: Medium Risk (02/28/2022)  Physical Activity: Insufficiently Active (02/28/2022)  Social Connections: Moderately Isolated (02/28/2022)  Stress: No Stress Concern Present (02/28/2022)  Tobacco Use: Low Risk  (11/05/2022)   SDOH Interventions:     Readmission Risk Interventions     No data to display

## 2022-11-07 NOTE — Progress Notes (Signed)
PROGRESS NOTE    Nicholas [name]  ZOX:[id number] DOB: 1951/09/17 DOA: 11/06/2022  PCP: [name], MD   Brief Narrative:  This 72 yrs old Male with PMH significant for Diastolic CHF, hypothyroidism, seizure disorder, A-fib on Eliquis, s/p cardioversion on 08/24/2022, MSSA bacteremia in 08/2022, chronic headaches followed by neurology, recently started on Ubrelvy, Cognitive disorder and frequent falls who was recently discharged from SNF on 10/24/2022 and he lives alone presented to the ED after he slid out of his bed in night with subsequent complaints of neck pain. He was hypotensive when EMS arrived, 96/60.  He reports several day history of cough.  ED workup revealed temp 101.4, BP 100/51, SpO2 98% on room air.  WBC 21 K with lactic acid 1.3.  COVID, influenza and RSV negative.  UA consistent with UTI.  CT head nonacute and chest x-ray no acute findings.  Patient was found to have neck stiffness on exam and started on empiric antibiotics for possible meningitis (Rocephin, vancomycin, acyclovir and dexamethasone.)  Eliquis was kept on hold.  IR consulted and patient underwent spinal tap.  CSF slightly high proteins but normal WBCs does not appear like bacterial meningitis.  Antibiotic de-escalated to cefepime.  Urine culture positive for Pseudomonas.  Assessment & Plan:   Principal Problem:   Acute meningitis Active Problems:   Sepsis ([facility])   Acute metabolic encephalopathy   Abnormal urinalysis, possible UTI   Chronic diastolic CHF (congestive heart failure) (HCC)   Hypothyroidism   Chronic headache   Seizure disorder (HCC)   Atrial fibrillation (HCC)   Chronic anticoagulation   Frailty  Suspected meningitis: Patient presented with fever, tachycardia, tachypnea, neck stiffness, headache. Empirically started on ceftriaxone, vancomycin and acyclovir. IR consulted for spinal tap which was not significant for bacterial meningitis. Continue adequate pain control.  Eliquis is on  hold. Meningitis ruled out.   Sepsis: History of MSSA bacteremia of unknown source 11/23.  TEE: Negative for vegetations Patient with neck pain and headache, also reported history of chronic headaches. Viral panel negative chest x-ray negative. Started on IV fluid resuscitation as per sepsis protocol. Urine culture grew Pseudomonas 100K. Antibiotics de-escalated to Cefepime. Follow-up blood cultures.   Acute metabolic encephalopathy: Likely secondary to sepsis. Patient was noted to be confused. Neurochecks and aspiration and fall precautions Mental status has improved back to baseline.   Pseudomonas UTI: Urinalysis consistent with UTI Patient was initially started on empiric antibiotics. Antibiotics de-escalated to cefepime.   Chronic diastolic CHF: Clinically appears euvolemic. Hold metoprolol and furosemide in the setting of sepsis Daily weights and monitor for fluid overload with sepsis fluid hydration   Hypothyroidism: Continue levothyroxine.   Frailty: Patient with multiple recent hospitalizations, frequent falls, recently discharged from SNF on 1/10, high readmission risk, lives alone PT and [facility] consult   Atrial Fibrillation: Eliquis is on hold for Spinal tap. Resume eliquis. Holding metoprolol due to soft blood pressures   Seizure disorder: Continue Depakote.   Chronic headache: Patient was recently switched from [place] to [place] Will hold both agents for now Discussed with neurology and recommended migraine cocktail.   DVT prophylaxis: SCDS Code Status:Full code. Family Communication:No family at bed side. Disposition Plan:  Status is: Inpatient Remains inpatient appropriate because:     Admitted for severe sepsis likely secondary to UTI,  initially came with suspected meningitis which has been ruled out by spinal tap.  Antibiotics de-escalated to cefepime for Pseudomonas UTI.  Consultants:  None  Procedures: Spinal tap  Antimicrobials:  Anti-infectives (From admission, onward)    Start     Dose/Rate Route Frequency Ordered Stop   11/07/22 1200  ceFEPIme (MAXIPIME) 2 g in sodium chloride 0.9 % 100 mL IVPB        2 g 200 mL/hr over 30 Minutes Intravenous Every 8 hours 11/07/22 1102 11/14/22 1159   11/07/22 0900  ampicillin (OMNIPEN) 2 g in sodium chloride 0.9 % 100 mL IVPB  Status:  Discontinued        2 g 300 mL/hr over 20 Minutes Intravenous Every 4 hours 11/07/22 0826 11/07/22 1100   11/06/22 1400  vancomycin (VANCOREADY) IVPB 1250 mg/250 mL  Status:  Discontinued        1,250 mg 166.7 mL/hr over 90 Minutes Intravenous Every 12 hours 11/06/22 1220 11/07/22 1100   11/06/22 1300  vancomycin (VANCOCIN) IVPB 1000 mg/200 mL premix  Status:  Discontinued        1,000 mg 200 mL/hr over 60 Minutes Intravenous Every 12 hours 11/06/22 0205 11/06/22 1220   11/06/22 1300  acyclovir (ZOVIRAX) 800 mg in dextrose 5 % 250 mL IVPB        800 mg 266 mL/hr over 60 Minutes Intravenous Every 8 hours 11/06/22 0205 11/20/22 0059   11/06/22 1200  cefTRIAXone (ROCEPHIN) 2 g in sodium chloride 0.9 % 100 mL IVPB  Status:  Discontinued        2 g 200 mL/hr over 30 Minutes Intravenous Every 12 hours 11/06/22 0145 11/07/22 1100   11/06/22 0400  ampicillin (OMNIPEN) 2 g in sodium chloride 0.9 % 100 mL IVPB  Status:  Discontinued        2 g 300 mL/hr over 20 Minutes Intravenous Every 4 hours 11/06/22 0158 11/07/22 0826   11/06/22 0300  acyclovir (ZOVIRAX) 800 mg in dextrose 5 % 250 mL IVPB        800 mg 266 mL/hr over 60 Minutes Intravenous  Once 11/06/22 0104 11/06/22 0529   11/06/22 0200  vancomycin (VANCOCIN) IVPB 1000 mg/200 mL premix  Status:  Discontinued        1,000 mg 200 mL/hr over 60 Minutes Intravenous  Once 11/06/22 0145 11/06/22 0206   11/06/22 0200  ampicillin (OMNIPEN) 2 g in sodium chloride 0.9 % 100 mL IVPB  Status:  Discontinued        2 g 300 mL/hr over 20 Minutes Intravenous  Once 11/06/22 0145 11/06/22 0203   11/06/22 0115   vancomycin (VANCOREADY) IVPB 2000 mg/400 mL        2,000 mg 200 mL/hr over 120 Minutes Intravenous  Once 11/06/22 0100 11/06/22 0441   11/06/22 0100  cefTRIAXone (ROCEPHIN) 2 g in sodium chloride 0.9 % 100 mL IVPB        2 g 200 mL/hr over 30 Minutes Intravenous  Once 11/06/22 0048 11/06/22 0136      Subjective: Patient was seen and examined at bedside.  Overnight events noted.   Patient reports doing better. He is able to bend his neck, reports headache is better.   He denies any nausea or vomiting.  Objective: Vitals:   11/07/22 1030 11/07/22 1100 11/07/22 1221 11/07/22 1355  BP: 114/63 112/67  120/65  Pulse: 99 (!) 101  (!) 103  Resp: 20 (!) 21  (!) 23  Temp:  98.2 F (36.8 C)    TempSrc:      SpO2: 93% 94% 94% 100%  Weight:      Height:        Intake/Output Summary (  Last 24 hours) at 11/07/2022 1426 Last data filed at 11/07/2022 2376 Gross per 24 hour  Intake 1622.78 ml  Output 550 ml  Net 1072.78 ml   Filed Weights   11/05/22 2156  Weight: 99.3 kg    Examination:  General exam: Appears comfortable, not in acute distress.  Deconditioned Respiratory system:CTA bilaterally, respiratory effort normal, RR 15 Cardiovascular system: S1 & S2 heard, irregular rhythm, no murmer. Gastrointestinal system: Abdomen is soft, non tender, non distended, BS+ Central nervous system: Alert and oriented  x 3. No focal neurological deficits. Extremities: No edema , no cyanosis, no clubbing Skin: No rashes, lesions or ulcers Psychiatry: Judgement and insight appear normal. Mood & affect appropriate.     Data Reviewed: I have personally reviewed following labs and imaging studies  CBC: Recent Labs  Lab 11/05/22 2201 11/06/22 0509 11/07/22 0920  WBC 21.3* 14.1* 13.1*  NEUTROABS 17.0*  --   --   HGB 12.8* 9.5* 9.4*  HCT 40.3 30.0* 29.7*  MCV 97.3 99.3 97.7  PLT 242 173 283   Basic Metabolic Panel: Recent Labs  Lab 11/05/22 2201 11/06/22 0509 11/07/22 0920  NA 139  136 140  K 3.5 3.5 3.0*  CL 100 105 108  CO2 '29 23 26  '$ GLUCOSE 126* 160* 90  BUN '20 19 19  '$ CREATININE 1.06 0.77 0.79  CALCIUM 8.3* 6.9* 7.5*  MG  --   --  1.7  PHOS  --   --  2.9   GFR: Estimated Creatinine Clearance: 96.8 mL/min (by C-G formula based on SCr of 0.79 mg/dL). Liver Function Tests: Recent Labs  Lab 11/05/22 2201  AST 21  ALT 8  ALKPHOS 78  BILITOT 2.1*  PROT 6.9  ALBUMIN 2.7*   No results for input(s): "LIPASE", "AMYLASE" in the last 168 hours. No results for input(s): "AMMONIA" in the last 168 hours. Coagulation Profile: Recent Labs  Lab 11/06/22 0509  INR 1.8*   Cardiac Enzymes: No results for input(s): "CKTOTAL", "CKMB", "CKMBINDEX", "TROPONINI" in the last 168 hours. BNP (last 3 results) No results for input(s): "PROBNP" in the last 8760 hours. HbA1C: No results for input(s): "HGBA1C" in the last 72 hours. CBG: No results for input(s): "GLUCAP" in the last 168 hours. Lipid Profile: No results for input(s): "CHOL", "HDL", "LDLCALC", "TRIG", "CHOLHDL", "LDLDIRECT" in the last 72 hours. Thyroid Function Tests: No results for input(s): "TSH", "T4TOTAL", "FREET4", "T3FREE", "THYROIDAB" in the last 72 hours. Anemia Panel: No results for input(s): "VITAMINB12", "FOLATE", "FERRITIN", "TIBC", "IRON", "RETICCTPCT" in the last 72 hours. Sepsis Labs: Recent Labs  Lab 11/05/22 2201 11/06/22 0031 11/06/22 0509 11/07/22 0920  PROCALCITON 0.12  --   --   --   LATICACIDVEN  --  1.3 4.0* 0.7    Recent Results (from the past 240 hour(s))  Resp panel by RT-PCR (RSV, Flu A&B, Covid) Anterior Nasal Swab     Status: None   Collection Time: 11/05/22 10:01 PM   Specimen: Anterior Nasal Swab  Result Value Ref Range Status   SARS Coronavirus 2 by RT PCR NEGATIVE NEGATIVE Final    Comment: (NOTE) SARS-CoV-2 target nucleic acids are NOT DETECTED.  The SARS-CoV-2 RNA is generally detectable in upper respiratory specimens during the acute phase of infection. The  lowest concentration of SARS-CoV-2 viral copies this assay can detect is 138 copies/mL. A negative result does not preclude SARS-Cov-2 infection and should not be used as the sole basis for treatment or other patient management decisions. A negative result  may occur with  improper specimen collection/handling, submission of specimen other than nasopharyngeal swab, presence of viral mutation(s) within the areas targeted by this assay, and inadequate number of viral copies(<138 copies/mL). A negative result must be combined with clinical observations, patient history, and epidemiological information. The expected result is Negative.  Fact Sheet for Patients:  EntrepreneurPulse.com.au  Fact Sheet for Healthcare Providers:  IncredibleEmployment.be  This test is no t yet approved or cleared by the [place] FDA and  has been authorized for detection and/or diagnosis of SARS-CoV-2 by FDA under an Emergency Use Authorization (EUA). This EUA will remain  in effect (meaning this test can be used) for the duration of the COVID-19 declaration under Section 564(b)(1) of the Act, 21 U.S.C.section 360bbb-3(b)(1), unless the authorization is terminated  or revoked sooner.       Influenza A by PCR NEGATIVE NEGATIVE Final   Influenza B by PCR NEGATIVE NEGATIVE Final    Comment: (NOTE) The Xpert Xpress SARS-CoV-2/FLU/RSV plus assay is intended as an aid in the diagnosis of influenza from Nasopharyngeal swab specimens and should not be used as a sole basis for treatment. Nasal washings and aspirates are unacceptable for Xpert Xpress SARS-CoV-2/FLU/RSV testing.  Fact Sheet for Patients: EntrepreneurPulse.com.au  Fact Sheet for Healthcare Providers: IncredibleEmployment.be  This test is not yet approved or cleared by the [place] FDA and has been authorized for detection and/or diagnosis of SARS-CoV-2 by FDA under  an Emergency Use Authorization (EUA). This EUA will remain in effect (meaning this test can be used) for the duration of the COVID-19 declaration under Section 564(b)(1) of the Act, 21 U.S.C. section 360bbb-3(b)(1), unless the authorization is terminated or revoked.     Resp Syncytial Virus by PCR NEGATIVE NEGATIVE Final    Comment: (NOTE) Fact Sheet for Patients: EntrepreneurPulse.com.au  Fact Sheet for Healthcare Providers: IncredibleEmployment.be  This test is not yet approved or cleared by the [place] FDA and has been authorized for detection and/or diagnosis of SARS-CoV-2 by FDA under an Emergency Use Authorization (EUA). This EUA will remain in effect (meaning this test can be used) for the duration of the COVID-19 declaration under Section 564(b)(1) of the Act, 21 U.S.C. section 360bbb-3(b)(1), unless the authorization is terminated or revoked.  Performed at [facility], [address] [place], [place] [address]   Blood culture (routine x 2)     Status: None (Preliminary result)   Collection Time: 11/06/22 12:31 AM   Specimen: BLOOD  Result Value Ref Range Status   Specimen Description BLOOD LEFT FOREARM  Final   Special Requests   Final    BOTTLES DRAWN AEROBIC AND ANAEROBIC Blood Culture results may not be optimal due to an excessive volume of blood received in culture bottles   Culture   Final    NO GROWTH 1 DAY Performed at [facility], [address] [place], [place] [address]    Report Status PENDING  Incomplete  Blood culture (routine x 2)     Status: None (Preliminary result)   Collection Time: 11/06/22 12:33 AM   Specimen: BLOOD  Result Value Ref Range Status   Specimen Description BLOOD LEFT ASSIST CONTROL  Final   Special Requests   Final    BOTTLES DRAWN AEROBIC AND ANAEROBIC Blood Culture results may not be optimal due to an excessive volume of blood received in culture bottles   Culture    Final    NO GROWTH 1 DAY Performed at [facility], [address] [place], [place] [address]    Report Status PENDING  Incomplete  Urine Culture     Status: Abnormal (Preliminary result)   Collection Time: 11/06/22  1:15 AM   Specimen: Urine, Clean Catch  Result Value Ref Range Status   Specimen Description   Final    URINE, CLEAN CATCH Performed at [facility], [address], [place], [place] [address]    Special Requests   Final    NONE Performed at [facility], [address], [place], [place] [address]    Culture (A)  Final    >=100,000 COLONIES/mL PSEUDOMONAS AERUGINOSA SUSCEPTIBILITIES TO FOLLOW Performed at [facility], [place] [address] [place], [place] [address]    Report Status PENDING  Incomplete  CSF culture w Gram Stain     Status: None (Preliminary result)   Collection Time: 11/06/22 11:20 AM   Specimen: PATH Cytology CSF; Cerebrospinal Fluid  Result Value Ref Range Status   Specimen Description   Final    CSF Performed at [facility], [address], [place], [place] [address]    Special Requests   Final    CYTO CSF Performed at [facility], [place], [place] [address]    Gram Stain   Final    WBC PRESENT,BOTH PMN AND MONONUCLEAR NO ORGANISMS SEEN CYTOSPIN SMEAR    Culture   Final    NO GROWTH < 24 HOURS Performed at [facility], [place] [address] [place], [place] [address]    Report Status PENDING  Incomplete  Culture, fungus without smear     Status: None (Preliminary result)   Collection Time: 11/06/22 11:20 AM   Specimen: PATH Cytology CSF; Cerebrospinal Fluid  Result Value Ref Range Status   Specimen Description   Final    CSF Performed at [facility], [address] [place], [place] [address]    Special Requests   Final    CYTO CSF Performed at [facility], [address], [place], [place] [address]    Culture   Final    NO FUNGUS  ISOLATED AFTER 1 DAY Performed at [facility], [place] [address], [place], [place] [address]    Report Status PENDING  Incomplete  Anaerobic culture w Gram Stain     Status: None (Preliminary result)   Collection Time: 11/06/22 11:20 AM   Specimen: PATH Cytology CSF; Cerebrospinal Fluid  Result Value Ref Range Status   Specimen Description   Final    CSF Performed at [facility], [address] [place], [place] [address]    Special Requests   Final    CYTO CSF Performed at [facility], [address] [place], [place] [address]    Gram Stain   Final    WBC PRESENT, PREDOMINANTLY MONONUCLEAR NO ORGANISMS SEEN CYTOSPIN SMEAR Performed at [facility], [place] [address], [place], [place] [address]    Culture PENDING  Incomplete   Report Status PENDING  Incomplete    Radiology Studies: DG FL GUIDED LUMBAR PUNCTURE  Result Date: 11/06/2022 CLINICAL DATA:  Concern for meningitis EXAM: DIAGNOSTIC LUMBAR PUNCTURE UNDER FLUOROSCOPIC GUIDANCE COMPARISON:  CT 03/01/2021 FLUOROSCOPY: Radiation Exposure Index (as provided by the fluoroscopic device): 24.30 mGy Kerma PROCEDURE: Informed consent was obtained from the patient prior to the procedure, including potential complications of headache, allergy, and pain. With the patient prone, the lower back was prepped with Betadine. 1% Lidocaine was  used for local anesthesia. Unsuccessful lumbar puncture attempt was made at L2-L3 level. Successful lumbar puncture was performed at the L4-L5 level using a 20 gauge needle with return of clear CSF with an opening pressure of 13 cm water. Approximately 8 ml of CSF were obtained for laboratory studies. The patient tolerated the procedure well and there were no apparent complications. IMPRESSION: Technically successful lumbar puncture at L4-L5 level. Opening pressure of 13 cm water, 8 mL of clear CSF collected and sent to the laboratory for analysis. Electronically Signed   By: Maurine Simmering M.D.   On: 11/06/2022 13:27   DG Chest Port 1 View  Result Date: 11/05/2022 CLINICAL DATA:  Cough. EXAM: PORTABLE CHEST 1 VIEW COMPARISON:  Chest radiograph dated 10/17/2022. FINDINGS: Shallow inspiration. Diffuse chronic short coarsening. No focal consolidation, pleural effusion, or pneumothorax. Stable cardiac silhouette. No acute osseous pathology. Bilateral shoulder arthroplasties. IMPRESSION: No active disease. Electronically Signed   By: Anner Crete M.D.   On: 11/05/2022 23:06   CT Cervical Spine Wo Contrast  Result Date: 11/05/2022 CLINICAL DATA:  Acute posterior neck pain, fall EXAM: CT CERVICAL SPINE WITHOUT CONTRAST TECHNIQUE: Multidetector CT imaging of the cervical spine was performed without intravenous contrast. Multiplanar CT image reconstructions were also generated. RADIATION DOSE REDUCTION: This exam was performed according to the departmental dose-optimization program which includes automated exposure control, adjustment of the mA and/or kV according to patient size and/or use of iterative reconstruction technique. COMPARISON:  None Available. FINDINGS: Alignment: Normal alignment.  No listhesis Skull base and vertebrae: Craniocervical alignment is normal. Atlantodental interval is not widened. No acute fracture of the cervical spine. Vertebral body height is preserved. Ankylosis of the C4-5 facet joints bilaterally. Soft tissues and spinal canal: No prevertebral fluid or swelling. No visible canal hematoma. Posterior disc osteophyte complex at C6-7 results in mild central canal stenosis with abutment and mild flattening of the thecal sac. AP diameter of the spinal canal at this level is approximately 7-8 mm. Milder changes are noted at C5-6. Disc levels: There is intervertebral disc space narrowing and endplate remodeling at M1-D6 in keeping with changes of advanced degenerative disc disease. Milder degenerative changes are seen elsewhere within the cervical spine. Prevertebral  soft tissues are not thickened on sagittal reformats. Multilevel uncovertebral and facet arthrosis results in multilevel moderate to severe neuroforaminal narrowing, most severe bilaterally at C3-4, C5-6 and C6-7. Upper chest: Negative. Other: None IMPRESSION: 1. No acute fracture or listhesis of the cervical spine. 2. Advanced multilevel degenerative disc and degenerative joint disease resulting in multilevel moderate to severe neuroforaminal narrowing, most severe bilaterally at C3-4, C5-6 and C6-7. 3. Mild central canal stenosis at C6-7. Electronically Signed   By: Fidela Salisbury M.D.   On: 11/05/2022 22:54   CT HEAD WO CONTRAST (5MM)  Result Date: 11/05/2022 CLINICAL DATA:  Mental status change, fall. EXAM: CT HEAD WITHOUT CONTRAST TECHNIQUE: Contiguous axial images were obtained from the base of the skull through the vertex without intravenous contrast. RADIATION DOSE REDUCTION: This exam was performed according to the departmental dose-optimization program which includes automated exposure control, adjustment of the mA and/or kV according to patient size and/or use of iterative reconstruction technique. COMPARISON:  CT head 10/17/2022 FINDINGS: Brain: No evidence of acute infarction, hemorrhage, hydrocephalus, extra-axial collection or mass lesion/mass effect. Vascular: No hyperdense vessel or unexpected calcification. Skull: Normal. Negative for fracture or focal lesion. Sinuses/Orbits: No acute finding. Other: None. IMPRESSION: No acute intracranial abnormality. Electronically Signed   By: Warren Lacy  Dagoberto Reef M.D.   On: 11/05/2022 22:46    Scheduled Meds:  amitriptyline  50 mg Oral QHS   atorvastatin  20 mg Oral Daily   divalproex  500 mg Oral QHS   levothyroxine  112 mcg Oral Q0600   pantoprazole  40 mg Oral QODAY   tamsulosin  0.4 mg Oral Daily   Continuous Infusions:  sodium chloride Stopped (11/07/22 0605)   acyclovir Stopped (11/07/22 1052)   ceFEPime (MAXIPIME) IV Stopped (11/07/22 1205)      LOS: 1 day    Time spent: 50 mins    Kwadwo Taras, MD [facility]   If 7PM-7AM, please contact night-coverage Chronic atrial fibrillation

## 2022-11-07 NOTE — ED Notes (Signed)
Pt given portable phone to call daughter Selinda Eon.

## 2022-11-07 NOTE — ED Notes (Signed)
RN secure message with MD Dwyane [name] in regards to morning labs. Pt did not have morning lab orders and last lactic drawn yesterday was 4.0. MD Dwyane [name] notified, waiting response/orders.

## 2022-11-07 NOTE — ED Notes (Signed)
Assumed care from [place], [place]. Pt resting comfortably in bed at this time. Pt denies any current needs or questions. Call light with in reach.

## 2022-11-07 NOTE — NC FL2 (Signed)
[place] LEVEL OF CARE FORM     IDENTIFICATION  Patient Name: Nicholas [name] Birthdate: 04/05/1951 Sex: male Admission Date (Current Location): 11/06/2022  [facility] and [place] Number:  Engineering geologist and Address:  [facility], [address], [place], [place] [address]      Provider Number: [id number]  Attending Physician Name and Address:  Shawna Clamp, MD  Relative Name and Phone Number:  Draycen Leichter- [phone]    Current Level of Care: Hospital Recommended Level of Care: [place] Prior Approval Number:    Date Approved/Denied:   PASRR Number: [id number]  Discharge Plan: SNF    Current Diagnoses: Patient Active Problem List   Diagnosis Date Noted   Acute meningitis 11/06/2022   Chronic anticoagulation 11/06/2022   Frailty 11/06/2022   Abnormal urinalysis, possible UTI 11/06/2022   FTT (failure to thrive) in adult 11/01/2022   UTI (urinary tract infection) 09/27/2022   Petechial rash 09/27/2022   Sepsis ([place]) 08/23/2022   MSSA bacteremia 08/16/2022 08/18/2022   Seizure disorder ([place]) 08/17/2022   Atrial fibrillation ([place]) 08/17/2022   Acute urinary retention 50/38/8828   Acute metabolic encephalopathy 00/34/9179   PND (post-nasal drip) 07/25/2022   Oral thrush 07/15/2022   Open wound of foot with complication, left, initial encounter 06/16/2022   Hx of excision of lamina of lumbar vertebra for decompression of spinal cord 03/20/2022   Peripheral edema 02/28/2022   Recurrent major depressive disorder ([place]) 02/28/2022   Recurrent falls 08/25/2021   Memory difficulty 07/04/2021   Chronic toe ulcer, left, with fat layer exposed ([place]) 01/23/2021   Pituitary adenoma ([place]) 09/06/2020   Chronic headache 07/27/2020   Vitamin B12 deficiency 03/31/2020   Vitamin D deficiency 03/31/2020   Numbness of foot 03/25/2020   Status post amputation of great toe, left ([place]) 12/14/2019   Callus of foot  04/02/2019   Allergy to bee sting 03/11/2019   Insomnia 02/10/2019   Status post lumbar spine operative procedure for decompression of spinal cord 12/12/2018   Dyslipidemia 10/19/2018   Foot deformity 10/19/2018   Status post reverse total replacement of right shoulder 05/13/2018   Chronic diastolic CHF (congestive heart failure) ([place]) 12/27/2017   OSA (obstructive sleep apnea) 12/27/2017   CKD (chronic kidney disease) stage 3, GFR 30-59 ml/min (HCC) 10/01/2017   Fatty liver 10/01/2017   Status post reverse total replacement of left shoulder 09/03/2017   Bilateral shoulder pain 05/24/2017   History of CVA in adulthood 03/08/2017   S/P total knee arthroplasty, left 01/30/2017   Osteoarthritis of knees, bilateral 01/15/2017   Obesity, Class I, BMI 30-34.9 07/19/2016   Hypothyroidism    Iron deficiency 04/23/2016   Chronic pain syndrome 04/23/2016   Inflammatory arthritis 04/23/2016   Chronic leg pain 04/23/2016   Lesion of palate 04/23/2016   GERD (gastroesophageal reflux disease)    Duodenitis determined by biopsy 02/13/2016   Diverticulosis of colon without hemorrhage 01/31/2016   Migraine 01/11/2012   History of methicillin resistant staphylococcus aureus (MRSA) 07/04/2010   DDD (degenerative disc disease), lumbar 07/04/2010   History of total right knee replacement 07/04/2010   Contact dermatitis 07/04/2010   Lesion of ulnar nerve 07/04/2010   Closed fracture of malar bone ([place]) 07/04/2010   Narrowing of intervertebral disc space 07/04/2010   Olecranon bursitis 07/04/2010   History of artificial joint 07/04/2010   Primary osteoarthritis, right shoulder 06/09/2007   Osteoarthritis 06/09/2007   Personal history of colonic adenoma 06/01/2003  Orientation RESPIRATION BLADDER Height & Weight     Self, Time, Situation, Place  Normal Continent Weight: 99.3 kg Height:  '5\' 8"'$  (172.7 cm)  BEHAVIORAL SYMPTOMS/MOOD NEUROLOGICAL BOWEL NUTRITION STATUS      Continent Diet (see  discharge summary)  AMBULATORY STATUS COMMUNICATION OF NEEDS Skin   Limited Assist Verbally Normal                       Personal Care Assistance Level of Assistance  Bathing, Feeding, Dressing Bathing Assistance: Limited assistance Feeding assistance: Independent Dressing Assistance: Limited assistance     Functional Limitations Info  Sight, Hearing, Speech Sight Info: Impaired (glasses) Hearing Info: Adequate Speech Info: Adequate    SPECIAL CARE FACTORS FREQUENCY  PT (By licensed PT), OT (By licensed OT)     PT Frequency: 5 times per week OT Frequency: 5 times per week            Contractures Contractures Info: Not present    Additional Factors Info  Code Status, Allergies Code Status Info: Full Allergies Info: Peanut containing drug products, yellow jacket venom, bee venom, celebrex, percocet, latex, NSAIDS           Current Medications (11/07/2022):  This is the current hospital active medication list Current Facility-Administered Medications  Medication Dose Route Frequency Provider Last Rate Last Admin   0.9 %  sodium chloride infusion   Intravenous Continuous Ward, Kristen N, DO   Paused at 11/07/22 0605   acetaminophen (TYLENOL) tablet 650 mg  650 mg Oral Q6H PRN Athena Masse, MD   650 mg at 11/06/22 1945   Or   acetaminophen (TYLENOL) suppository 650 mg  650 mg Rectal Q6H PRN Athena Masse, MD       acyclovir (ZOVIRAX) 800 mg in dextrose 5 % 250 mL IVPB  800 mg Intravenous Q8H Renda Rolls, RPH   Stopped at 11/07/22 0214   amitriptyline (ELAVIL) tablet 50 mg  50 mg Oral QHS Judd Gaudier V, MD   50 mg at 11/06/22 2113   ampicillin (OMNIPEN) 2 g in sodium chloride 0.9 % 100 mL IVPB  2 g Intravenous Q4H Shawna Clamp, MD 300 mL/hr at 11/07/22 0919 2 g at 11/07/22 0919   atorvastatin (LIPITOR) tablet 20 mg  20 mg Oral Daily Judd Gaudier V, MD   20 mg at 11/07/22 0925   cefTRIAXone (ROCEPHIN) 2 g in sodium chloride 0.9 % 100 mL IVPB  2 g  Intravenous Q12H Athena Masse, MD   Stopped at 11/07/22 0059   divalproex (DEPAKOTE ER) 24 hr tablet 500 mg  500 mg Oral QHS Judd Gaudier V, MD   500 mg at 11/06/22 2113   HYDROcodone-acetaminophen (NORCO/VICODIN) 5-325 MG per tablet 1-2 tablet  1-2 tablet Oral Q4H PRN Athena Masse, MD       levothyroxine (SYNTHROID) tablet 112 mcg  112 mcg Oral Q0600 Athena Masse, MD   112 mcg at 11/07/22 0604   ondansetron (ZOFRAN) tablet 4 mg  4 mg Oral Q6H PRN Athena Masse, MD       Or   ondansetron [facility]) injection 4 mg  4 mg Intravenous Q6H PRN Athena Masse, MD       pantoprazole (PROTONIX) EC tablet 40 mg  40 mg Oral Jeralene Huff, MD       tamsulosin [facility]) capsule 0.4 mg  0.4 mg Oral Daily Athena Masse, MD   0.4 mg at 11/07/22  0925   vancomycin (VANCOREADY) IVPB 1250 mg/250 mL  1,250 mg Intravenous Q12H Noralee Space, [facility]   Stopped at 11/07/22 4268   Current Outpatient Medications  Medication Sig Dispense Refill   acetaminophen (TYLENOL) 500 MG tablet Take 1,000 mg by mouth 3 (three) times daily.     amitriptyline (ELAVIL) 50 MG tablet Take 1 tablet (50 mg total) by mouth at bedtime. 90 tablet 1   apixaban (ELIQUIS) 5 MG TABS tablet Take 1 tablet (5 mg total) by mouth 2 (two) times daily. 60 tablet 3   atorvastatin (LIPITOR) 20 MG tablet TAKE ONE TABLET BY MOUTH ONCE DAILY 90 tablet 3   B Complex Vitamins (B-COMPLEX/B-12 PO) Take 1 tablet by mouth daily.     cetirizine (ZYRTEC) 10 MG tablet Take 1 tablet (10 mg total) by mouth daily. 30 tablet 0   Cholecalciferol (VITAMIN D3) 25 MCG (1000 UT) CAPS Take 1 capsule (1,000 Units total) by mouth daily. 90 capsule 1   divalproex (DEPAKOTE ER) 500 MG 24 hr tablet TAKE ONE TABLET BY MOUTH EVERYDAY AT BEDTIME 30 tablet 5   EPINEPHrine 0.3 mg/0.3 mL IJ SOAJ injection Inject 0.3 mg into the muscle as needed for anaphylaxis. (bee stings) 2 each 1   ferrous sulfate 325 (65 FE) MG EC tablet Take 325 mg by mouth daily with  breakfast.     fluticasone (FLONASE) 50 MCG/ACT nasal spray Place 2 sprays into both nostrils daily. 16 g 0   furosemide (LASIX) 20 MG tablet TAKE ONE TABLET BY MOUTH EVERY MORNING 90 tablet 1   gabapentin (NEURONTIN) 300 MG capsule TAKE ONE CAPSULE BY MOUTH EVERY MORNING and TAKE TWO CAPSULES BY MOUTH AT NOON and TAKE TWO CAPSULES BY MOUTH EVERY EVENING 450 capsule 1   levothyroxine (SYNTHROID) 112 MCG tablet Take 1 tablet (112 mcg total) by mouth daily before breakfast. 90 tablet 3   metoprolol succinate (TOPROL-XL) 100 MG 24 hr tablet Take 1 tablet (100 mg total) by mouth at bedtime. Take with or immediately following a meal.     nitroGLYCERIN (NITROSTAT) 0.4 MG SL tablet Place 1 tablet (0.4 mg total) under the tongue every 5 (five) minutes as needed for chest pain. 20 tablet 3   ondansetron (ZOFRAN-ODT) 4 MG disintegrating tablet TAKE ONE TABLET BY MOUTH every EIGHT hours AS NEEDED 20 tablet 3   pantoprazole (PROTONIX) 40 MG tablet Take 40 mg by mouth every other day.     tamsulosin (FLOMAX) 0.4 MG CAPS capsule TAKE ONE CAPSULE BY MOUTH ONCE DAILY 30 capsule 0   tiZANidine (ZANAFLEX) 4 MG tablet TAKE ONE TABLET BY MOUTH every SIX hours AS NEEDED FOR acute HEADACHE 20 tablet 1   Ubrogepant (UBRELVY) 100 MG TABS Take 100 mg by mouth as needed (take 1 tablet at onset of headache, may repeat in hours, max is 200 mg in 24 hours). (Patient taking differently: Take 100 mg by mouth as needed (Give 1 tablet by mouth every 12 hours as needed for headache).) 12 tablet 11   clotrimazole (LOTRIMIN) 1 % cream Apply 1 Application topically 2 (two) times daily as needed (rash). (Patient not taking: Reported on 10/30/2022)     Fremanezumab-vfrm (AJOVY) 225 MG/1.5ML SOAJ Inject 225 mg into the skin every 30 (thirty) days. (Patient not taking: Reported on 11/01/2022) 1.68 mL 11   HYDROcodone-acetaminophen (NORCO/VICODIN) 5-325 MG tablet Take 1 tablet by mouth every 6 (six) hours as needed for moderate pain. (Patient  not taking: Reported on 11/06/2022)  nystatin (MYCOSTATIN) 100000 UNIT/ML suspension Take 5 mLs (500,000 Units total) by mouth 4 (four) times daily. (Patient not taking: Reported on 11/06/2022) 200 mL 0     Discharge Medications: Please see discharge summary for a list of discharge medications.  Relevant Imaging Results:  Relevant Lab Results:   Additional Information SSN: [id number]  Shelbie Hutching, RN

## 2022-11-07 NOTE — ED Notes (Signed)
PT at bedside.

## 2022-11-07 NOTE — Evaluation (Signed)
Physical Therapy Evaluation Patient Details Name: Nicholas [name] MRN: [id number] DOB: 1951/09/15 Today's Date: 11/07/2022  History of Present Illness  Nicholas [name] is a 72 y.o. male with medical history significant for Diastolic CHF, hypothyroidism, L great toe amputation, B TKA, R knee contracture in extension, seizure disorder, Afib on Eliquis s/p cardioversion 08/24/2022, ,  MSSA bacteremia 08/2022, chronic headache followed by neurology, recently started on Ubrelvy, cognitive disorder and frequent falls, discharged from SNF on 10/24/2022, and who lives alone, who presents to the ED by EMS after sliding out of bed on the night of arrival with subsequent complaint of neck pain.  He was hypotensive with EMS at 96/60.  He complains of a several day history of a cough.  He denies shortness of breath, chest pain.  He endorses headache in addition to neck pain.  Clinical Impression  Patient received in bed, room dark. He is agreeable to PT assessment. Patient required min A for bed mobility. Transfers sit to stand with min A. He is able to take some side steps along edge of bed and go from bed to recliner with RW and min guard. Patient will continue to benefit from skilled PT to improve safety and independence.        Recommendations for follow up therapy are one component of a multi-disciplinary discharge planning process, led by the attending physician.  Recommendations may be updated based on patient status, additional functional criteria and insurance authorization.  Follow Up Recommendations Skilled nursing-short term rehab (<3 hours/day) Can patient physically be transported by private vehicle: Yes    Assistance Recommended at Discharge Frequent or constant Supervision/Assistance  Patient can return home with the following  A lot of help with walking and/or transfers;A lot of help with bathing/dressing/bathroom;Assistance with cooking/housework;Assist for transportation;Help with stairs or ramp  for entrance    Equipment Recommendations None recommended by PT  Recommendations for Other Services       Functional Status Assessment Patient has had a recent decline in their functional status and demonstrates the ability to make significant improvements in function in a reasonable and predictable amount of time.     Precautions / Restrictions Precautions Precautions: Fall Restrictions Weight Bearing Restrictions: No      Mobility  Bed Mobility Overal bed mobility: Needs Assistance Bed Mobility: Supine to Sit     Supine to sit: Min assist     General bed mobility comments: Min A for trunk control during sup to sit    Transfers Overall transfer level: Needs assistance Equipment used: Rolling walker (2 wheels) Transfers: Sit to/from Stand Sit to Stand: Min assist           General transfer comment: min A    Ambulation/Gait Ambulation/Gait assistance: Min Web designer (Feet): 4 Feet Assistive device: Rolling walker (2 wheels) Gait Pattern/deviations: Step-to pattern, Decreased step length - left, Decreased step length - right Gait velocity: decreased     General Gait Details: patient able to take some side steps at edge of bed. Then transferred to [place] Mobility    Modified Rankin (Stroke Patients Only)       Balance Overall balance assessment: Needs assistance, History of Falls Sitting-balance support: Feet supported Sitting balance-Leahy Scale: Good     Standing balance support: Bilateral upper extremity supported, During functional activity, Reliant on assistive device for balance Standing balance-Leahy Scale: Fair  Pertinent Vitals/Pain Pain Assessment Pain Assessment: Faces Faces Pain Scale: Hurts a little bit Pain Descriptors / Indicators: Headache Pain Intervention(s): Monitored during session, Repositioned    Home Living Family/patient expects  to be discharged to:: Skilled nursing facility Living Arrangements: Alone Available Help at Discharge: Family;Friend(s);Neighbor;Available PRN/intermittently Type of Home: Independent living facility Home Access: Ramped entrance       Home Layout: One level Home Equipment: Rollator (4 wheels);Cane - single point;Electric scooter;Grab bars - toilet;Grab bars - tub/shower;Wheelchair - Publishing copy (2 wheels) 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 Pt reports family lives in [place], has a son.    Prior Function Prior Level of Function : History of Falls (last six months)             Mobility Comments: Mod Ind amb with a rollator, numerous falls in the last 6 months ADLs Comments: patient having difficut time managing at home     Hand Dominance   Dominant Hand: Right    Extremity/Trunk Assessment   Upper Extremity Assessment Upper Extremity Assessment: Generalized weakness    Lower Extremity Assessment Lower Extremity Assessment: Generalized weakness RLE Deficits / Details: Pt with chronic R knee extension contracture the pt stated occured gradually after his R knee replacement Sx around 12 years ago; pt reported has consulted with orthopedic MDs regarding the issue but was told there was no options available secondary to him not being a good candidate for surgery    Cervical / Trunk Assessment Cervical / Trunk Assessment: Normal  Communication   Communication: No difficulties  Cognition Arousal/Alertness: Awake/alert Behavior During Therapy: WFL for tasks assessed/performed Overall Cognitive Status: Within Functional Limits for tasks assessed                                 General Comments: Unable to tell me details about falls, and situation prior to this admission        General Comments      Exercises     Assessment/Plan    PT Assessment Patient needs continued PT services  PT Problem List  Decreased strength;Decreased activity tolerance;Decreased balance;Decreased mobility;Decreased knowledge of use of DME;Decreased safety awareness;Decreased range of motion       PT Treatment Interventions DME instruction;Gait training;Functional mobility training;Therapeutic activities;Therapeutic exercise;Balance training;Patient/family education    PT Goals (Current goals can be found in the Care Plan section)  Acute Rehab PT Goals Patient Stated Goal: To walk better without falling, go back to rehab. Patient reports he is not able to manage at home alone. PT Goal Formulation: With patient Time For Goal Achievement: 11/21/22 Potential to Achieve Goals: Fair    Frequency Min 2X/week     Co-evaluation               AM-PAC PT "6 Clicks" Mobility  Outcome Measure Help needed turning from your back to your side while in a flat bed without using bedrails?: A Little Help needed moving from lying on your back to sitting on the side of a flat bed without using bedrails?: A Little Help needed moving to and from a bed to a chair (including a wheelchair)?: A Little Help needed standing up from a chair using your arms (e.g., wheelchair or bedside chair)?: A Little Help needed to walk in hospital room?: A Little Help needed climbing 3-5 steps with a railing? : A Lot 6 Click Score: 17    End of Session Equipment  Utilized During Treatment: Oxygen Activity Tolerance: Patient tolerated treatment well Patient left: in chair;with call bell/phone within reach Nurse Communication: Mobility status PT Visit Diagnosis: Unsteadiness on feet (R26.81);Repeated falls (R29.6);Difficulty in walking, not elsewhere classified (R26.2);Muscle weakness (generalized) (M62.81)    Time: 1761-6073 PT Time Calculation (min) (ACUTE ONLY): 24 min   Charges:   PT Evaluation $PT Eval Moderate Complexity: 1 Mod PT Treatments $Therapeutic Activity: 8-22 mins        Kimiyo Carmicheal, PT, GCS 11/07/22,12:41  PM

## 2022-11-07 NOTE — ED Notes (Signed)
RN secure chat with MD Dwyane [name] regarding pt NPO order and if he is able to change diet order for patient to eat. MD Dwyane [name] changing diet order to soft diet.

## 2022-11-07 NOTE — ED Notes (Signed)
Pt ate entirety of meal. Pt requesting to go back to bed. Pt moved to bed with x1 assist. Pt call light within reach.

## 2022-11-07 NOTE — ED Notes (Signed)
Patient without signs or symptoms of respiratory distress. No accessory muscle use, no shortness of breath, no increase oxygen demand, however patient with fine crackles noted to bases. Morton Amy messaged via secure chat, and RN inquired if IV fluids should be continued.

## 2022-11-07 NOTE — ED Notes (Signed)
Informed RN bed assigned 

## 2022-11-08 DIAGNOSIS — G039 Meningitis, unspecified: Secondary | ICD-10-CM | POA: Diagnosis not present

## 2022-11-08 LAB — BASIC METABOLIC PANEL
Anion gap: 3 — ABNORMAL LOW (ref 5–15)
BUN: 15 mg/dL (ref 8–23)
CO2: 28 mmol/L (ref 22–32)
Calcium: 7.6 mg/dL — ABNORMAL LOW (ref 8.9–10.3)
Chloride: 108 mmol/L (ref 98–111)
Creatinine, Ser: 0.75 mg/dL (ref 0.61–1.24)
GFR, Estimated: >60 mL/min (ref >60)
Glucose, Bld: 86 mg/dL (ref 70–99)
Potassium: 3 mmol/L — ABNORMAL LOW (ref 3.5–5.1)
Sodium: 139 mmol/L (ref 135–145)

## 2022-11-08 LAB — MAGNESIUM: Magnesium: 1.6 mg/dL — ABNORMAL LOW (ref 1.7–2.4)

## 2022-11-08 LAB — HSV 1/2 PCR, CSF
HSV-1 DNA: NEGATIVE
HSV-2 DNA: NEGATIVE

## 2022-11-08 LAB — URINE CULTURE: Culture: >=100000 — AB

## 2022-11-08 LAB — CBC
HCT: 29.3 % — ABNORMAL LOW (ref 39.0–52.0)
Hemoglobin: 9.4 g/dL — ABNORMAL LOW (ref 13.0–17.0)
MCH: 30.7 pg (ref 26.0–34.0)
MCHC: 32.1 g/dL (ref 30.0–36.0)
MCV: 95.8 fL (ref 80.0–100.0)
Platelets: 206 10*3/uL (ref 150–400)
RBC: 3.06 MIL/uL — ABNORMAL LOW (ref 4.22–5.81)
RDW: 14.9 % (ref 11.5–15.5)
WBC: 9.7 10*3/uL (ref 4.0–10.5)
nRBC: 0 % (ref 0.0–0.2)

## 2022-11-08 LAB — PHOSPHORUS: Phosphorus: 2.6 mg/dL (ref 2.5–4.6)

## 2022-11-08 LAB — CYTOLOGY - NON PAP

## 2022-11-08 MED ORDER — GABAPENTIN 300 MG PO CAPS
600.0000 mg | ORAL_CAPSULE | Freq: Two times a day (BID) | ORAL | Status: DC
  Administered 2022-11-08 – 2022-11-09 (×2): 600 mg via ORAL
  Filled 2022-11-08 (×2): qty 2

## 2022-11-08 MED ORDER — APIXABAN 5 MG PO TABS
5.0000 mg | ORAL_TABLET | Freq: Two times a day (BID) | ORAL | Status: DC
  Administered 2022-11-08 – 2022-11-09 (×3): 5 mg via ORAL
  Filled 2022-11-08 (×3): qty 1

## 2022-11-08 MED ORDER — POTASSIUM CHLORIDE 20 MEQ PO PACK
40.0000 meq | PACK | Freq: Once | ORAL | Status: AC
  Administered 2022-11-08: 40 meq via ORAL
  Filled 2022-11-08: qty 2

## 2022-11-08 MED ORDER — MAGNESIUM SULFATE 2 GM/50ML IV SOLN
2.0000 g | Freq: Once | INTRAVENOUS | Status: AC
  Administered 2022-11-08: 2 g via INTRAVENOUS
  Filled 2022-11-08: qty 50

## 2022-11-08 MED ORDER — FERROUS SULFATE 325 (65 FE) MG PO TABS
325.0000 mg | ORAL_TABLET | Freq: Every day | ORAL | Status: DC
  Administered 2022-11-08 – 2022-11-09 (×2): 325 mg via ORAL
  Filled 2022-11-08 (×2): qty 1

## 2022-11-08 MED ORDER — GABAPENTIN 300 MG PO CAPS
300.0000 mg | ORAL_CAPSULE | Freq: Every morning | ORAL | Status: DC
  Administered 2022-11-09: 300 mg via ORAL
  Filled 2022-11-08: qty 1

## 2022-11-08 MED ORDER — ADULT MULTIVITAMIN W/MINERALS CH
1.0000 | ORAL_TABLET | Freq: Every day | ORAL | Status: DC
  Administered 2022-11-08 – 2022-11-09 (×2): 1 via ORAL
  Filled 2022-11-08 (×2): qty 1

## 2022-11-08 MED ORDER — ENSURE ENLIVE PO LIQD
237.0000 mL | Freq: Two times a day (BID) | ORAL | Status: DC
  Administered 2022-11-08 – 2022-11-09 (×2): 237 mL via ORAL

## 2022-11-08 NOTE — TOC Progression Note (Addendum)
Transition of Care [facility]) - Progression Note    Patient Details  Name: Nicholas [name] MRN: [id number] Date of Birth: December 07, 1950  Transition of [facility]) CM/SW Contact  Gerilyn Pilgrim, LCSW Phone Number: 11/08/2022, 2:44 PM  Clinical Narrative:    SW spoke with son regarding pt. Son would like [facility] for SNF as well as pt. LVM with [facility] to see if we are able to start insurance auth. Son also interested in  seeing about options for ALF. Referral made to [facility].   4:02pm Auth started for [employer] pt can come tomorrow or over the weekend and will go to room 105.   Expected Discharge Plan: [place] Barriers to Discharge: Continued Medical Work up  Expected Discharge Plan and Services   Discharge Planning Services: CM Consult Post Acute Care Choice: [place] Living arrangements for the past 2 months: Single Family Home                 DME Arranged: N/A DME Agency: NA       HH Arranged: NA HH Agency: NA         Social Determinants of Health (SDOH) Interventions SDOH Screenings   Food Insecurity: No Food Insecurity (11/07/2022)  Housing: Low Risk  (11/07/2022)  Transportation Needs: Unmet Transportation Needs (11/07/2022)  Utilities: Not At Risk (11/07/2022)  Alcohol Screen: Low Risk  (02/28/2022)  Depression (PHQ2-9): Medium Risk (10/30/2022)  Financial Resource Strain: Medium Risk (02/28/2022)  Physical Activity: Insufficiently Active (02/28/2022)  Social Connections: Moderately Isolated (02/28/2022)  Stress: No Stress Concern Present (02/28/2022)  Tobacco Use: Low Risk  (11/05/2022)    Readmission Risk Interventions    11/07/2022    9:43 AM  Readmission Risk Prevention Plan  Transportation Screening Complete  Medication Review ([place]) Complete  PCP or Specialist appointment within 3-5 days of discharge Complete  HRI or [place] Complete  SW Recovery Care/Counseling Consult Complete   [place] Complete

## 2022-11-08 NOTE — Progress Notes (Signed)
Initial Nutrition Assessment  DOCUMENTATION CODES:   Obesity unspecified  INTERVENTION:   -Liberalize diet to dysphagia 3 diet for ease of intake -Ensure Enlive po BID, each supplement provides 350 kcal and 20 grams of protein -MVI with minerals daily  NUTRITION DIAGNOSIS:   Increased nutrient needs related to acute illness (sepsis) as evidenced by estimated needs.  GOAL:   Patient will meet greater than or equal to 90% of their needs  MONITOR:   PO intake, Supplement acceptance  REASON FOR ASSESSMENT:   Malnutrition Screening Tool    ASSESSMENT:   Pt with PMH significant for Diastolic CHF, hypothyroidism, seizure disorder, A-fib on Eliquis, s/p cardioversion on 08/24/2022, MSSA bacteremia in 08/2022, chronic headaches followed by neurology, recently started on Ubrelvy, Cognitive disorder and frequent falls presented after he slid out of his bed in night with subsequent complaints of neck pain  Pt admitted with suspected meninginitis, sepsis, and acute metabolic encephalopathy.   1/23- s/p LP  Reviewed I/O's: +893 ml x 24 hours and +4.2 L since admission  UOP: 550 ml x 24 hours  Pt lying in bed at time of visit. Pt did not respond to voice or touch. No supports at bedside to provide additional history.   Pt currently on a soft diet. No meal completion data available to assess at this time.    Reviewed wt hx; pt has experienced a 6.8% wt loss over the past 3 months, which is not significant for time frame.   Given pt's confusion, pt would benefit from addition of oral nutrition supplements and mechanical downgrade of diet for ease of intake.   Medications reviewed and include ferrous sulfate.   Labs reviewed: K: 3, CBGS: 106 (inpatient orders for glycemic control are none).    NUTRITION - FOCUSED PHYSICAL EXAM:  Flowsheet Row Most Recent Value  Orbital Region No depletion  Upper Arm Region No depletion  Thoracic and Lumbar Region No depletion  Buccal Region  No depletion  Temple Region No depletion  Clavicle Bone Region No depletion  Clavicle and Acromion Bone Region No depletion  Scapular Bone Region No depletion  Dorsal Hand No depletion  Patellar Region No depletion  Anterior Thigh Region No depletion  Posterior Calf Region No depletion  Edema (RD Assessment) Mild  Hair Reviewed  Eyes Reviewed  Mouth Reviewed  Skin Reviewed  Nails Reviewed       Diet Order:   Diet Order             DIET SOFT Room service appropriate? Yes; Fluid consistency: Thin  Diet effective now                   EDUCATION NEEDS:   No education needs have been identified at this time  Skin:  Skin Assessment: Reviewed RN Assessment  Last BM:  11/07/22 (type 6)  Height:   Ht Readings from Last 1 Encounters:  11/05/22 '5\' 8"'$  (1.727 m)    Weight:   Wt Readings from Last 1 Encounters:  11/05/22 99.3 kg    Ideal Body Weight:  70 kg  BMI:  Body mass index is 33.29 kg/m.  Estimated Nutritional Needs:   Kcal:  1900-2100  Protein:  105-120 grams  Fluid:  > 1.9 L    Loistine Chance, RD, LDN, [place] Registered Dietitian II Certified Diabetes Care and Education Specialist Please refer to [facility] for RD and/or RD on-call/weekend/after hours pager

## 2022-11-08 NOTE — Progress Notes (Addendum)
PROGRESS NOTE    Nicholas [name]  IRJ:[id number] DOB: 01/31/1951 DOA: 11/06/2022  PCP: [name], MD   Brief Narrative:  This 72 yrs old Male with PMH significant for Diastolic CHF, hypothyroidism, seizure disorder, A-fib on Eliquis, s/p cardioversion on 08/24/2022, MSSA bacteremia in 08/2022, chronic headaches followed by neurology, recently started on Ubrelvy, Cognitive disorder and frequent falls who was recently discharged from SNF on 10/24/2022 and he lives alone presented to the ED after he slid out of his bed in night with subsequent complaints of neck pain. He was hypotensive when EMS arrived, 96/60.  He reports several day history of cough.  ED workup revealed temp 101.4, BP 100/51, SpO2 98% on room air. WBC 21 K with lactic acid 1.3.  COVID, influenza and RSV negative.  UA consistent with UTI.  CT head nonacute and chest x-ray no acute findings.  Patient was found to have neck stiffness on exam and started on empiric antibiotics for possible meningitis (Rocephin, vancomycin, acyclovir and dexamethasone.)  Eliquis was kept on hold.  IR consulted and patient underwent spinal tap. CSF slightly high proteins but normal WBCs does not appear like bacterial meningitis.  Antibiotic de-escalated to cefepime.  Urine culture positive for Pseudomonas.  Assessment & Plan:   Principal Problem:   Acute meningitis Active Problems:   Sepsis ([place])   Acute metabolic encephalopathy   Abnormal urinalysis, possible UTI   Chronic diastolic CHF (congestive heart failure) (HCC)   Hypothyroidism   Chronic headache   Seizure disorder (HCC)   Atrial fibrillation (HCC)   Chronic anticoagulation   Frailty  Suspected Meningitis: Patient presented with fever, tachycardia, tachypnea, neck stiffness, headache. Empirically started on ceftriaxone, vancomycin and acyclovir. IR consulted for spinal tap which was not significant for bacterial meningitis. Continue adequate pain control.  Eliquis is on  hold. Meningitis ruled out. Eliquis resumed today.   Sepsis: History of MSSA bacteremia of unknown source in 11/23.  TEE: Negative for vegetations Patient with neck pain and headache, also reported history of chronic headaches. Viral panel negative,  Chest x-ray negative. Started on IV fluid resuscitation as per sepsis protocol. Urine culture grew Pseudomonas 100K. Antibiotics de-escalated to Cefepime. Blood culture no growth so far.   Acute metabolic encephalopathy: Likely secondary to sepsis. Patient was noted to be confused. Neurochecks,  Aspiration and fall precautions. Mental status has improved and back to baseline.   Pseudomonas UTI: Urinalysis consistent with UTI. Patient was initially started on empiric antibiotics. Antibiotics de-escalated to cefepime.   Chronic diastolic CHF: Clinically appears euvolemic. Hold metoprolol and furosemide in the setting of sepsis Daily weights and monitor for fluid overload with sepsis fluid hydration   Hypothyroidism: Continue levothyroxine.   Frailty: Patient with multiple recent hospitalizations, frequent falls, recently discharged from SNF on 1/10, high readmission risk, lives alone. PT and TOC consult   Atrial Fibrillation: Eliquis was on hold for spinal tap.  Eliquis is resumed today. Holding metoprolol due to soft blood pressures   Seizure disorder: Continue Depakote.   Chronic headache: Patient was recently switched from [place] to [place]ink Will hold both agents for now Discussed with neurology and recommended migraine cocktail.  Hypokalemia / Hypomagnesemia : Replaced.  Continue to monitor   DVT prophylaxis: SCDS Code Status:Full code. Family Communication:No family at bed side. Disposition Plan:  Status is: Inpatient Remains inpatient appropriate because:     Admitted for severe sepsis likely secondary to UTI,  initially came with suspected meningitis which has been ruled out by spinal tap.  Antibiotics  de-escalated to cefepime for Pseudomonas UTI.  Consultants:  None  Procedures: Spinal tap  Antimicrobials:  Anti-infectives (From admission, onward)    Start     Dose/Rate Route Frequency Ordered Stop   11/07/22 1200  ceFEPIme (MAXIPIME) 2 g in sodium chloride 0.9 % 100 mL IVPB        2 g 200 mL/hr over 30 Minutes Intravenous Every 8 hours 11/07/22 1102 11/14/22 1159   11/07/22 0900  ampicillin (OMNIPEN) 2 g in sodium chloride 0.9 % 100 mL IVPB  Status:  Discontinued        2 g 300 mL/hr over 20 Minutes Intravenous Every 4 hours 11/07/22 0826 11/07/22 1100   11/06/22 1400  vancomycin (VANCOREADY) IVPB 1250 mg/250 mL  Status:  Discontinued        1,250 mg 166.7 mL/hr over 90 Minutes Intravenous Every 12 hours 11/06/22 1220 11/07/22 1100   11/06/22 1300  vancomycin (VANCOCIN) IVPB 1000 mg/200 mL premix  Status:  Discontinued        1,000 mg 200 mL/hr over 60 Minutes Intravenous Every 12 hours 11/06/22 0205 11/06/22 1220   11/06/22 1300  acyclovir (ZOVIRAX) 800 mg in dextrose 5 % 250 mL IVPB        800 mg 266 mL/hr over 60 Minutes Intravenous Every 8 hours 11/06/22 0205 11/20/22 0059   11/06/22 1200  cefTRIAXone (ROCEPHIN) 2 g in sodium chloride 0.9 % 100 mL IVPB  Status:  Discontinued        2 g 200 mL/hr over 30 Minutes Intravenous Every 12 hours 11/06/22 0145 11/07/22 1100   11/06/22 0400  ampicillin (OMNIPEN) 2 g in sodium chloride 0.9 % 100 mL IVPB  Status:  Discontinued        2 g 300 mL/hr over 20 Minutes Intravenous Every 4 hours 11/06/22 0158 11/07/22 0826   11/06/22 0300  acyclovir (ZOVIRAX) 800 mg in dextrose 5 % 250 mL IVPB        800 mg 266 mL/hr over 60 Minutes Intravenous  Once 11/06/22 0104 11/06/22 0529   11/06/22 0200  vancomycin (VANCOCIN) IVPB 1000 mg/200 mL premix  Status:  Discontinued        1,000 mg 200 mL/hr over 60 Minutes Intravenous  Once 11/06/22 0145 11/06/22 0206   11/06/22 0200  ampicillin (OMNIPEN) 2 g in sodium chloride 0.9 % 100 mL IVPB  Status:   Discontinued        2 g 300 mL/hr over 20 Minutes Intravenous  Once 11/06/22 0145 11/06/22 0203   11/06/22 0115  vancomycin (VANCOREADY) IVPB 2000 mg/400 mL        2,000 mg 200 mL/hr over 120 Minutes Intravenous  Once 11/06/22 0100 11/06/22 0441   11/06/22 0100  cefTRIAXone (ROCEPHIN) 2 g in sodium chloride 0.9 % 100 mL IVPB        2 g 200 mL/hr over 30 Minutes Intravenous  Once 11/06/22 0048 11/06/22 0136      Subjective: Patient was seen and examined at bedside.  Overnight events noted.   Patient reports doing much better, he is able to bend his neck, reports headache is better. He denies any nausea or vomiting.  Objective: Vitals:   11/08/22 0001 11/08/22 0407 11/08/22 0846 11/08/22 1201  BP: 119/66 120/67 121/72 (!) 102/56  Pulse: 91 94 88 98  Resp: '18 18 18 18  '$ Temp: 97.8 F (36.6 C) 98.2 F (36.8 C) 98.1 F (36.7 C) 97.7 F (36.5 C)  TempSrc: Oral Oral Oral  SpO2: 99% 96% 97% 94%  Weight:      Height:        Intake/Output Summary (Last 24 hours) at 11/08/2022 1427 Last data filed at 11/08/2022 1100 Gross per 24 hour  Intake 1443.52 ml  Output 951 ml  Net 492.52 ml   Filed Weights   11/05/22 2156  Weight: 99.3 kg    Examination:  General exam: Appears comfortable, not in any acute distress.  Deconditioned. Respiratory system: CTA bilaterally, respiratory effort normal, RR 15 Cardiovascular system: S1 & S2 heard, irregular rhythm, no murmer. Gastrointestinal system: Abdomen is soft, non tender, non distended, BS+ Central nervous system: Alert and oriented  x 3. No focal neurological deficits. Extremities: No edema , no cyanosis, no clubbing Skin: No rashes, lesions or ulcers Psychiatry: Judgement and insight appear normal. Mood & affect appropriate.     Data Reviewed: I have personally reviewed following labs and imaging studies  CBC: Recent Labs  Lab 11/05/22 2201 11/06/22 0509 11/07/22 0920 11/08/22 0409  WBC 21.3* 14.1* 13.1* 9.7  NEUTROABS  17.0*  --   --   --   HGB 12.8* 9.5* 9.4* 9.4*  HCT 40.3 30.0* 29.7* 29.3*  MCV 97.3 99.3 97.7 95.8  PLT 242 173 202 030   Basic Metabolic Panel: Recent Labs  Lab 11/05/22 2201 11/06/22 0509 11/07/22 0920 11/08/22 0409  NA 139 136 140 139  K 3.5 3.5 3.0* 3.0*  CL 100 105 108 108  CO2 '29 23 26 28  '$ GLUCOSE 126* 160* 90 86  BUN '20 19 19 15  '$ CREATININE 1.06 0.77 0.79 0.75  CALCIUM 8.3* 6.9* 7.5* 7.6*  MG  --   --  1.7 1.6*  PHOS  --   --  2.9 2.6   GFR: Estimated Creatinine Clearance: 96.8 mL/min (by C-G formula based on SCr of 0.75 mg/dL). Liver Function Tests: Recent Labs  Lab 11/05/22 2201  AST 21  ALT 8  ALKPHOS 78  BILITOT 2.1*  PROT 6.9  ALBUMIN 2.7*   No results for input(s): "LIPASE", "AMYLASE" in the last 168 hours. No results for input(s): "AMMONIA" in the last 168 hours. Coagulation Profile: Recent Labs  Lab 11/06/22 0509  INR 1.8*   Cardiac Enzymes: No results for input(s): "CKTOTAL", "CKMB", "CKMBINDEX", "TROPONINI" in the last 168 hours. BNP (last 3 results) No results for input(s): "PROBNP" in the last 8760 hours. HbA1C: No results for input(s): "HGBA1C" in the last 72 hours. CBG: No results for input(s): "GLUCAP" in the last 168 hours. Lipid Profile: No results for input(s): "CHOL", "HDL", "LDLCALC", "TRIG", "CHOLHDL", "LDLDIRECT" in the last 72 hours. Thyroid Function Tests: No results for input(s): "TSH", "T4TOTAL", "FREET4", "T3FREE", "THYROIDAB" in the last 72 hours. Anemia Panel: No results for input(s): "VITAMINB12", "FOLATE", "FERRITIN", "TIBC", "IRON", "RETICCTPCT" in the last 72 hours. Sepsis Labs: Recent Labs  Lab 11/05/22 2201 11/06/22 0031 11/06/22 0509 11/07/22 0920  PROCALCITON 0.12  --   --   --   LATICACIDVEN  --  1.3 4.0* 0.7    Recent Results (from the past 240 hour(s))  Resp panel by RT-PCR (RSV, Flu A&B, Covid) Anterior Nasal Swab     Status: None   Collection Time: 11/05/22 10:01 PM   Specimen: Anterior Nasal  Swab  Result Value Ref Range Status   SARS Coronavirus 2 by RT PCR NEGATIVE NEGATIVE Final    Comment: (NOTE) SARS-CoV-2 target nucleic acids are NOT DETECTED.  The SARS-CoV-2 RNA is generally detectable in upper respiratory specimens during  the acute phase of infection. The lowest concentration of SARS-CoV-2 viral copies this assay can detect is 138 copies/mL. A negative result does not preclude SARS-Cov-2 infection and should not be used as the sole basis for treatment or other patient management decisions. A negative result may occur with  improper specimen collection/handling, submission of specimen other than nasopharyngeal swab, presence of viral mutation(s) within the areas targeted by this assay, and inadequate number of viral copies(<138 copies/mL). A negative result must be combined with clinical observations, patient history, and epidemiological information. The expected result is Negative.  Fact Sheet for Patients:  EntrepreneurPulse.com.au  Fact Sheet for Healthcare Providers:  IncredibleEmployment.be  This test is no t yet approved or cleared by the [place] FDA and  has been authorized for detection and/or diagnosis of SARS-CoV-2 by FDA under an Emergency Use Authorization (EUA). This EUA will remain  in effect (meaning this test can be used) for the duration of the COVID-19 declaration under Section 564(b)(1) of the Act, 21 U.S.C.section 360bbb-3(b)(1), unless the authorization is terminated  or revoked sooner.       Influenza A by PCR NEGATIVE NEGATIVE Final   Influenza B by PCR NEGATIVE NEGATIVE Final    Comment: (NOTE) The Xpert Xpress SARS-CoV-2/FLU/RSV plus assay is intended as an aid in the diagnosis of influenza from Nasopharyngeal swab specimens and should not be used as a sole basis for treatment. Nasal washings and aspirates are unacceptable for Xpert Xpress SARS-CoV-2/FLU/RSV testing.  Fact Sheet for  Patients: EntrepreneurPulse.com.au  Fact Sheet for Healthcare Providers: IncredibleEmployment.be  This test is not yet approved or cleared by the [place] FDA and has been authorized for detection and/or diagnosis of SARS-CoV-2 by FDA under an Emergency Use Authorization (EUA). This EUA will remain in effect (meaning this test can be used) for the duration of the COVID-19 declaration under Section 564(b)(1) of the Act, 21 U.S.C. section 360bbb-3(b)(1), unless the authorization is terminated or revoked.     Resp Syncytial Virus by PCR NEGATIVE NEGATIVE Final    Comment: (NOTE) Fact Sheet for Patients: EntrepreneurPulse.com.au  Fact Sheet for Healthcare Providers: IncredibleEmployment.be  This test is not yet approved or cleared by the [place] FDA and has been authorized for detection and/or diagnosis of SARS-CoV-2 by FDA under an Emergency Use Authorization (EUA). This EUA will remain in effect (meaning this test can be used) for the duration of the COVID-19 declaration under Section 564(b)(1) of the Act, 21 U.S.C. section 360bbb-3(b)(1), unless the authorization is terminated or revoked.  Performed at [facility], [address] [place], [place] [address]   Blood culture (routine x 2)     Status: None (Preliminary result)   Collection Time: 11/06/22 12:31 AM   Specimen: BLOOD  Result Value Ref Range Status   Specimen Description BLOOD LEFT FOREARM  Final   Special Requests   Final    BOTTLES DRAWN AEROBIC AND ANAEROBIC Blood Culture results may not be optimal due to an excessive volume of blood received in culture bottles   Culture   Final    NO GROWTH 2 DAYS Performed at [facility], [address] [place], [place] [address]    Report Status PENDING  Incomplete  Blood culture (routine x 2)     Status: None (Preliminary result)   Collection Time: 11/06/22 12:33 AM    Specimen: BLOOD  Result Value Ref Range Status   Specimen Description BLOOD LEFT ASSIST CONTROL  Final   Special Requests  Final    BOTTLES DRAWN AEROBIC AND ANAEROBIC Blood Culture results may not be optimal due to an excessive volume of blood received in culture bottles   Culture   Final    NO GROWTH 2 DAYS Performed at [facility], [address] [place], [place] [address]    Report Status PENDING  Incomplete  Urine Culture     Status: Abnormal   Collection Time: 11/06/22  1:15 AM   Specimen: Urine, Clean Catch  Result Value Ref Range Status   Specimen Description   Final    URINE, CLEAN CATCH Performed at [facility], [address] [place], [place] [address]    Special Requests   Final    NONE Performed at [facility], [address] [place], [place] [address]    Culture >=100,000 COLONIES/mL PSEUDOMONAS AERUGINOSA (A)  Final   Report Status 11/08/2022 FINAL  Final   Organism ID, Bacteria PSEUDOMONAS AERUGINOSA (A)  Final      Susceptibility   Pseudomonas aeruginosa - MIC*    CEFTAZIDIME 4 SENSITIVE Sensitive     CIPROFLOXACIN <=0.25 SENSITIVE Sensitive     GENTAMICIN <=1 SENSITIVE Sensitive     IMIPENEM 2 SENSITIVE Sensitive     PIP/TAZO 8 SENSITIVE Sensitive     CEFEPIME 2 SENSITIVE Sensitive     * >=100,000 COLONIES/mL PSEUDOMONAS AERUGINOSA  CSF culture w Gram Stain     Status: None (Preliminary result)   Collection Time: 11/06/22 11:20 AM   Specimen: PATH Cytology CSF; Cerebrospinal Fluid  Result Value Ref Range Status   Specimen Description   Final    CSF Performed at [facility], [address], [place], [place] [address]    Special Requests   Final    CYTO CSF Performed at [facility], [address] [place], [place] [address]    Gram Stain   Final    WBC PRESENT,BOTH PMN AND MONONUCLEAR NO ORGANISMS SEEN CYTOSPIN SMEAR    Culture   Final    NO GROWTH 2 DAYS Performed at [place], [place] [address], [place], [place] [address]    Report Status PENDING  Incomplete  Culture, fungus without smear     Status: None (Preliminary result)   Collection Time: 11/06/22 11:20 AM   Specimen: PATH Cytology CSF; Cerebrospinal Fluid  Result Value Ref Range Status   Specimen Description   Final    CSF Performed at [facility], [address] [place], [place] [address]    Special Requests   Final    CYTO CSF Performed at [facility], [address] [place], [place] [address]    Culture   Final    NO FUNGUS ISOLATED AFTER 2 DAYS Performed at [facility], [place] [address] [place], [place] [address]    Report Status PENDING  Incomplete  Anaerobic culture w Gram Stain     Status: None (Preliminary result)   Collection Time: 11/06/22 11:20 AM   Specimen: PATH Cytology CSF; Cerebrospinal Fluid  Result Value Ref Range Status   Specimen Description   Final    CSF Performed at [facility], [address], [place],  [address]    Special Requests   Final    CYTO CSF Performed at [facility], [address] [place],  [address]    Gram Stain   Final    WBC PRESENT, PREDOMINANTLY MONONUCLEAR NO ORGANISMS SEEN CYTOSPIN SMEAR Performed at [facility], [place] [address], [place], [place] [address]    Culture   Final    NO ANAEROBES ISOLATED; CULTURE IN PROGRESS FOR 5 DAYS   Report Status PENDING  Incomplete    Radiology Studies: No results found.  Scheduled Meds:  amitriptyline  50 mg Oral QHS   apixaban  5 mg Oral BID   atorvastatin  20 mg Oral Daily   divalproex  500 mg Oral QHS   feeding supplement  237 mL Oral BID BM   ferrous sulfate  325 mg Oral Q breakfast   levothyroxine  112 mcg Oral Q0600   multivitamin with minerals  1 tablet Oral Daily   pantoprazole  40 mg Oral QODAY   tamsulosin  0.4 mg Oral Daily   Continuous Infusions:  sodium chloride 50 mL/hr at 11/07/22 1644   acyclovir 800 mg  (11/08/22 0857)   ceFEPime (MAXIPIME) IV 2 g (11/08/22 1219)     LOS: 2 days    Time spent: 35 mins    Demiana Crumbley, MD [facility]   If 7PM-7AM, please contact night-coverage Chronic atrial fibrillation

## 2022-11-09 DIAGNOSIS — R197 Diarrhea, unspecified: Secondary | ICD-10-CM | POA: Diagnosis not present

## 2022-11-09 DIAGNOSIS — E785 Hyperlipidemia, unspecified: Secondary | ICD-10-CM | POA: Diagnosis not present

## 2022-11-09 DIAGNOSIS — I4891 Unspecified atrial fibrillation: Secondary | ICD-10-CM | POA: Diagnosis not present

## 2022-11-09 DIAGNOSIS — G43909 Migraine, unspecified, not intractable, without status migrainosus: Secondary | ICD-10-CM | POA: Diagnosis not present

## 2022-11-09 DIAGNOSIS — M542 Cervicalgia: Secondary | ICD-10-CM | POA: Diagnosis not present

## 2022-11-09 DIAGNOSIS — M19011 Primary osteoarthritis, right shoulder: Secondary | ICD-10-CM | POA: Diagnosis not present

## 2022-11-09 DIAGNOSIS — L97523 Non-pressure chronic ulcer of other part of left foot with necrosis of muscle: Secondary | ICD-10-CM | POA: Diagnosis not present

## 2022-11-09 DIAGNOSIS — K219 Gastro-esophageal reflux disease without esophagitis: Secondary | ICD-10-CM | POA: Diagnosis not present

## 2022-11-09 DIAGNOSIS — N183 Chronic kidney disease, stage 3 unspecified: Secondary | ICD-10-CM | POA: Diagnosis not present

## 2022-11-09 DIAGNOSIS — R519 Headache, unspecified: Secondary | ICD-10-CM | POA: Diagnosis not present

## 2022-11-09 DIAGNOSIS — Z7901 Long term (current) use of anticoagulants: Secondary | ICD-10-CM | POA: Diagnosis not present

## 2022-11-09 DIAGNOSIS — F32A Depression, unspecified: Secondary | ICD-10-CM | POA: Diagnosis not present

## 2022-11-09 DIAGNOSIS — M6281 Muscle weakness (generalized): Secondary | ICD-10-CM | POA: Diagnosis not present

## 2022-11-09 DIAGNOSIS — I5032 Chronic diastolic (congestive) heart failure: Secondary | ICD-10-CM | POA: Diagnosis not present

## 2022-11-09 DIAGNOSIS — E039 Hypothyroidism, unspecified: Secondary | ICD-10-CM | POA: Diagnosis not present

## 2022-11-09 DIAGNOSIS — G039 Meningitis, unspecified: Secondary | ICD-10-CM | POA: Diagnosis not present

## 2022-11-09 DIAGNOSIS — G43109 Migraine with aura, not intractable, without status migrainosus: Secondary | ICD-10-CM | POA: Diagnosis not present

## 2022-11-09 DIAGNOSIS — I5023 Acute on chronic systolic (congestive) heart failure: Secondary | ICD-10-CM | POA: Diagnosis not present

## 2022-11-09 DIAGNOSIS — N390 Urinary tract infection, site not specified: Secondary | ICD-10-CM | POA: Diagnosis not present

## 2022-11-09 DIAGNOSIS — B965 Pseudomonas (aeruginosa) (mallei) (pseudomallei) as the cause of diseases classified elsewhere: Secondary | ICD-10-CM | POA: Diagnosis not present

## 2022-11-09 DIAGNOSIS — R54 Age-related physical debility: Secondary | ICD-10-CM | POA: Diagnosis not present

## 2022-11-09 DIAGNOSIS — R262 Difficulty in walking, not elsewhere classified: Secondary | ICD-10-CM | POA: Diagnosis not present

## 2022-11-09 LAB — CBC
HCT: 30 % — ABNORMAL LOW (ref 39.0–52.0)
Hemoglobin: 9.7 g/dL — ABNORMAL LOW (ref 13.0–17.0)
MCH: 30.7 pg (ref 26.0–34.0)
MCHC: 32.3 g/dL (ref 30.0–36.0)
MCV: 94.9 fL (ref 80.0–100.0)
Platelets: 186 10*3/uL (ref 150–400)
RBC: 3.16 MIL/uL — ABNORMAL LOW (ref 4.22–5.81)
RDW: 14.8 % (ref 11.5–15.5)
WBC: 6.9 10*3/uL (ref 4.0–10.5)
nRBC: 0 % (ref 0.0–0.2)

## 2022-11-09 LAB — BASIC METABOLIC PANEL
Anion gap: 5 (ref 5–15)
BUN: 10 mg/dL (ref 8–23)
CO2: 27 mmol/L (ref 22–32)
Calcium: 7.5 mg/dL — ABNORMAL LOW (ref 8.9–10.3)
Chloride: 108 mmol/L (ref 98–111)
Creatinine, Ser: 0.77 mg/dL (ref 0.61–1.24)
GFR, Estimated: >60 mL/min (ref >60)
Glucose, Bld: 78 mg/dL (ref 70–99)
Potassium: 3.8 mmol/L (ref 3.5–5.1)
Sodium: 140 mmol/L (ref 135–145)

## 2022-11-09 LAB — CSF CULTURE W GRAM STAIN: Culture: NO GROWTH

## 2022-11-09 LAB — PHOSPHORUS: Phosphorus: 3.4 mg/dL (ref 2.5–4.6)

## 2022-11-09 LAB — MAGNESIUM: Magnesium: 1.9 mg/dL (ref 1.7–2.4)

## 2022-11-09 MED ORDER — CIPROFLOXACIN HCL 500 MG PO TABS
500.0000 mg | ORAL_TABLET | Freq: Two times a day (BID) | ORAL | Status: DC
  Administered 2022-11-09: 500 mg via ORAL
  Filled 2022-11-09: qty 1

## 2022-11-09 MED ORDER — CIPROFLOXACIN HCL 500 MG PO TABS: 500.0000 mg | ORAL_TABLET | Freq: Two times a day (BID) | ORAL | 0 refills | Status: AC

## 2022-11-09 NOTE — Discharge Instructions (Signed)
Advised to continue ciprofloxacin 500 mg twice daily for 6 more days for Pseudomonas UTI. Advised to continue current medication.

## 2022-11-09 NOTE — Progress Notes (Signed)
Report called to [facility].Left voicemail to call Regino Schultze at [phone] for report on pt going to room 403B

## 2022-11-09 NOTE — Care Management Important Message (Signed)
Important Message  Patient Details  Name: Nicholas [name] MRN: [id number] Date of Birth: [birth year]   Medicare Important Message Given:  Yes  I reviewed the Important Message from Medicare with the patients son, Krikor Willet and he is in agreement with the discharge plan. I wished him well and thanked him for his time.   Juliann Pulse A Skylarr Liz 11/09/2022, 1:51 PM

## 2022-11-09 NOTE — TOC Progression Note (Signed)
Transition of Care [facility]) - Progression Note    Patient Details  Name: Nicholas [name] MRN: [id number] Date of Birth: [birth year]  Transition of Care [facility]) CM/SW Contact  Gerilyn Pilgrim, LCSW Phone Number: 11/09/2022, 8:34 AM  Clinical Narrative:   Josem Kaufmann approved for [facility]. Auth ID [id number]. CSW will contact facility to ensure pt is still appropriate to come today.    Expected Discharge Plan: [place] Barriers to Discharge: Continued Medical Work up  Expected Discharge Plan and Services   Discharge Planning Services: CM Consult Post Acute Care Choice: [place] Living arrangements for the past 2 months: Single Family Home                 DME Arranged: N/A DME Agency: NA       HH Arranged: NA HH Agency: NA         Social Determinants of Health (SDOH) Interventions SDOH Screenings   Food Insecurity: No Food Insecurity (11/07/2022)  Housing: Low Risk  (11/07/2022)  Transportation Needs: Unmet Transportation Needs (11/07/2022)  Utilities: Not At Risk (11/07/2022)  Alcohol Screen: Low Risk  (02/28/2022)  Depression (PHQ2-9): Medium Risk (10/30/2022)  Financial Resource Strain: Medium Risk (02/28/2022)  Physical Activity: Insufficiently Active (02/28/2022)  Social Connections: Moderately Isolated (02/28/2022)  Stress: No Stress Concern Present (02/28/2022)  Tobacco Use: Low Risk  (11/05/2022)    Readmission Risk Interventions    11/07/2022    9:43 AM  Readmission Risk Prevention Plan  Transportation Screening Complete  Medication Review ([place]) Complete  PCP or Specialist appointment within 3-5 days of discharge Complete  HRI or [place] Complete  SW Recovery Care/Counseling Consult Complete  [place] Complete

## 2022-11-09 NOTE — TOC Transition Note (Addendum)
Transition of Care [facility]) - CM/SW Discharge Note   Patient Details  Name: Nicholas [name] MRN: [id number] Date of Birth: [birth year]  Transition of [facility]) CM/SW Contact:  Gerilyn Pilgrim, LCSW Phone Number: 11/09/2022, 10:58 AM   Clinical Narrative:   [facility] able to take pt. RN given number for report. Son reports he will be transporting pt after lunch sometime. [place] notified. CSW will send DC summary once in.   12:06pm DC summary sent to facility.   Final next level of care: Skilled Nursing Facility Barriers to Discharge: Barriers Resolved   Patient Goals and CMS Choice CMS Medicare.gov Compare Post Acute Care list provided to:: Patient Choice offered to / list presented to : Patient  Discharge Placement                  Patient to be transferred to facility by: family member   Patient and family notified of of transfer: 11/09/22  Discharge Plan and Services Additional resources added to the After Visit Summary for     Discharge Planning Services: CM Consult Post Acute Care Choice: [place]          DME Arranged: N/A DME Agency: NA       HH Arranged: NA HH Agency: NA        Social Determinants of Health (SDOH) Interventions SDOH Screenings   Food Insecurity: No Food Insecurity (11/07/2022)  Housing: Low Risk  (11/07/2022)  Transportation Needs: Unmet Transportation Needs (11/07/2022)  Utilities: Not At Risk (11/07/2022)  Alcohol Screen: Low Risk  (02/28/2022)  Depression (PHQ2-9): Medium Risk (10/30/2022)  Financial Resource Strain: Medium Risk (02/28/2022)  Physical Activity: Insufficiently Active (02/28/2022)  Social Connections: Moderately Isolated (02/28/2022)  Stress: No Stress Concern Present (02/28/2022)  Tobacco Use: Low Risk  (11/05/2022)     Readmission Risk Interventions    11/07/2022    9:43 AM  Readmission Risk Prevention Plan  Transportation Screening Complete  Medication Review ([place]) Complete  PCP or  Specialist appointment within 3-5 days of discharge Complete  HRI or [place] Complete  SW Recovery Care/Counseling Consult Complete  [place] Complete

## 2022-11-09 NOTE — Progress Notes (Signed)
Discharge instructions given and went over with son. Patient discharged. Orma Flaming, RN

## 2022-11-09 NOTE — Discharge Summary (Signed)
Physician Discharge Summary  Nicholas [name] NWG:[id number] DOB: 16-May-1951 DOA: 11/06/2022  PCP: [name], MD  Admit date: 11/06/2022  Discharge date: 11/09/2022  Admitted From: Home.  Disposition:  SNF [facility] [facility])  Recommendations for Outpatient Follow-up:  Follow up with PCP in 1-2 weeks. Please obtain BMP/CBC in one week. Advised to continue ciprofloxacin 500 mg twice daily for 6 more days for Pseudomonas UTI. Advised to continue current medications.  Home Health: None Equipment/Devices: 1L/min Oxygen  Discharge Condition: Stable CODE STATUS:Full code Diet recommendation: Heart Healthy  Brief [facility] Course: This 72 yrs old Male with PMH significant for Diastolic CHF, hypothyroidism, seizure disorder, A-fib on Eliquis, s/p cardioversion on 08/24/2022, MSSA bacteremia in 08/2022, chronic headaches followed by neurology, recently started on Ubrelvy, Cognitive disorder and frequent falls who was recently discharged from SNF on 10/24/2022 and he lives alone presented to the ED after he slid out of his bed in night with subsequent complaints of neck pain. He was hypotensive when EMS arrived, BP 96/60. He reports several days history of cough.  ED workup revealed temp 101.4, BP 100/51, SpO2 98% on room air. WBC 21 K with lactic acid 1.3. COVID, influenza and RSV negative. UA consistent with UTI. CT head nonacute and chest x-ray no acute findings. Patient was found to have neck stiffness on exam and started on empiric antibiotics for possible meningitis (Rocephin, vancomycin, acyclovir and dexamethasone.)  Eliquis was kept on hold.  IR consulted and patient underwent spinal tap. CSF slightly high proteins but normal WBCs,  does not appear like bacterial meningitis.  Antibiotic de-escalated to cefepime.  Urine culture positive for Pseudomonas. Patient was continued on IV antibiotics.  HSV negative.  CSF labs came back negative, PT and OT recommended SNF.  Patient feels much  better and wants to be discharged.  Patient is being discharged on ciprofloxacin for 6 more days to complete 10-day treatment for Pseudomonas UTI.  Patient is being discharged to SNF.  Discharge Diagnoses:  Principal Problem:   Acute meningitis Active Problems:   Sepsis ([place])   Acute metabolic encephalopathy   Abnormal urinalysis, possible UTI   Chronic diastolic CHF (congestive heart failure) (HCC)   Hypothyroidism   Chronic headache   Seizure disorder (HCC)   Atrial fibrillation (HCC)   Chronic anticoagulation   Frailty  Suspected Meningitis: Ruled out. Patient presented with fever, tachycardia, tachypnea, neck stiffness, headache. Empirically started on ceftriaxone, vancomycin and acyclovir. IR consulted for spinal tap which was not significant for bacterial meningitis. Continue adequate pain control.  Eliquis is on hold. Meningitis ruled out. Eliquis resumed 11/08/22.   Sepsis: History of MSSA bacteremia of unknown source in 11/23.  TEE: Negative for vegetations Patient with neck pain and headache, also reported history of chronic headaches. Viral panel negative,  Chest x-ray negative. Started on IV fluid resuscitation as per sepsis protocol. Urine culture grew Pseudomonas 100K. Antibiotics de-escalated to Cefepime. Blood culture no growth so far.   Acute metabolic encephalopathy: Likely secondary to sepsis. Patient was noted to be confused. Neurochecks,  Aspiration and fall precautions. Mental status has improved and back to baseline.   Pseudomonas UTI: Urinalysis consistent with UTI. Patient was initially started on empiric antibiotics. Antibiotics de-escalated to cefepime. Patient being discharged on ciprofloxacin 500 twice daily for 6 more days for pseudomonal UTI.   Chronic diastolic CHF: Clinically appears euvolemic. Hold metoprolol and furosemide in the setting of sepsis Daily weights and monitor for fluid overload with sepsis fluid hydration. Metoprolol and  to be furosemide resumed.   Hypothyroidism: Continue levothyroxine.   Frailty: Patient with multiple recent hospitalizations, frequent falls, recently discharged from SNF on 1/10, high readmission risk, lives alone. PT and TOC consult > SNF   Atrial Fibrillation: Eliquis was on hold for spinal tap.  Eliquis is resumed back. Metoprolol resumed.  Blood pressure improved.   Seizure disorder: Continue Depakote.   Chronic headache: Patient was recently switched from [place] to [place] Will hold both agents for now Discussed with neurology and recommended migraine cocktail.   Hypokalemia / Hypomagnesemia : Replaced.  Continue to monitor  Discharge Instructions  Discharge Instructions     Call MD for:  difficulty breathing, headache or visual disturbances   Complete by: As directed    Call MD for:  persistant dizziness or light-headedness   Complete by: As directed    Call MD for:  persistant nausea and vomiting   Complete by: As directed    Diet - low sodium heart healthy   Complete by: As directed    Diet Carb Modified   Complete by: As directed    Discharge instructions   Complete by: As directed    Advised to follow-up with primary care physician in 1 week. Advised to continue ciprofloxacin 500 mg twice daily for 6 more days for Pseudomonas UTI. Advised to continue current medication.   Increase activity slowly   Complete by: As directed       Allergies as of 11/09/2022       Reactions   Peanut-containing Drug Products Anaphylaxis, Dermatitis   Yellow Jacket Venom [bee Venom] Anaphylaxis, Other (See Comments)   Respiratory Distress   Celebrex [celecoxib] Other (See Comments)   BLACK STOOL?MELENA?   Percocet [oxycodone-acetaminophen] Hives, Swelling, Other (See Comments)   Latex Rash   Nsaids Rash        Medication List     STOP taking these medications    Ajovy 225 MG/1.5ML Soaj Generic drug: Fremanezumab-vfrm   clotrimazole 1 % cream Commonly known  as: LOTRIMIN   HYDROcodone-acetaminophen 5-325 MG tablet Commonly known as: NORCO/VICODIN   nystatin 100000 UNIT/ML suspension Commonly known as: MYCOSTATIN       TAKE these medications    acetaminophen 500 MG tablet Commonly known as: TYLENOL Take 1,000 mg by mouth 3 (three) times daily.   amitriptyline 50 MG tablet Commonly known as: ELAVIL Take 1 tablet (50 mg total) by mouth at bedtime.   apixaban 5 MG Tabs tablet Commonly known as: ELIQUIS Take 1 tablet (5 mg total) by mouth 2 (two) times daily.   atorvastatin 20 MG tablet Commonly known as: LIPITOR TAKE ONE TABLET BY MOUTH ONCE DAILY   B-COMPLEX/B-12 PO Take 1 tablet by mouth daily.   cetirizine 10 MG tablet Commonly known as: ZYRTEC Take 1 tablet (10 mg total) by mouth daily.   ciprofloxacin 500 MG tablet Commonly known as: CIPRO Take 1 tablet (500 mg total) by mouth 2 (two) times daily for 6 days.   divalproex 500 MG 24 hr tablet Commonly known as: DEPAKOTE ER TAKE ONE TABLET BY MOUTH EVERYDAY AT BEDTIME   EPINEPHrine 0.3 mg/0.3 mL Soaj injection Commonly known as: EPI-PEN Inject 0.3 mg into the muscle as needed for anaphylaxis. (bee stings)   ferrous sulfate 325 (65 FE) MG EC tablet Take 325 mg by mouth daily with breakfast.   fluticasone 50 MCG/ACT nasal spray Commonly known as: FLONASE Place 2 sprays into both nostrils daily.   furosemide 20 MG tablet Commonly known as:  LASIX TAKE ONE TABLET BY MOUTH EVERY MORNING   gabapentin 300 MG capsule Commonly known as: NEURONTIN TAKE ONE CAPSULE BY MOUTH EVERY MORNING and TAKE TWO CAPSULES BY MOUTH AT NOON and TAKE TWO CAPSULES BY MOUTH EVERY EVENING   levothyroxine 112 MCG tablet Commonly known as: SYNTHROID Take 1 tablet (112 mcg total) by mouth daily before breakfast.   metoprolol succinate 100 MG 24 hr tablet Commonly known as: TOPROL-XL Take 1 tablet (100 mg total) by mouth at bedtime. Take with or immediately following a meal.    nitroGLYCERIN 0.4 MG SL tablet Commonly known as: NITROSTAT Place 1 tablet (0.4 mg total) under the tongue every 5 (five) minutes as needed for chest pain.   ondansetron 4 MG disintegrating tablet Commonly known as: ZOFRAN-ODT TAKE ONE TABLET BY MOUTH every EIGHT hours AS NEEDED   pantoprazole 40 MG tablet Commonly known as: PROTONIX Take 40 mg by mouth every other day. Notes to patient: Last dose 11/08/2022   tamsulosin 0.4 MG Caps capsule Commonly known as: FLOMAX TAKE ONE CAPSULE BY MOUTH ONCE DAILY   tiZANidine 4 MG tablet Commonly known as: ZANAFLEX TAKE ONE TABLET BY MOUTH every SIX hours AS NEEDED FOR acute HEADACHE   Ubrelvy 100 MG Tabs Generic drug: Ubrogepant Take 100 mg by mouth as needed (take 1 tablet at onset of headache, may repeat in hours, max is 200 mg in 24 hours). What changed: reasons to take this   Vitamin D3 25 MCG (1000 UT) Caps Take 1 capsule (1,000 Units total) by mouth daily.        Contact information for follow-up providers     [name], MD Follow up in 1 week(s).   Specialty: Family Medicine Contact information: [place] [address] [phone]              Contact information for after-discharge care     Destination     [facility] .   Service: Skilled Nursing Contact information: [place] [address] [phone]                    Allergies  Allergen Reactions   Peanut-Containing Drug Products Anaphylaxis and Dermatitis   Yellow Jacket Venom [Bee Venom] Anaphylaxis and Other (See Comments)    Respiratory Distress   Celebrex [Celecoxib] Other (See Comments)    BLACK STOOL?MELENA?   Percocet [Oxycodone-Acetaminophen] Hives, Swelling and Other (See Comments)   Latex Rash   Nsaids Rash    Consultations: None   Procedures/Studies: DG FL GUIDED LUMBAR PUNCTURE  Result Date: 11/06/2022 CLINICAL DATA:  Concern for meningitis EXAM:  DIAGNOSTIC LUMBAR PUNCTURE UNDER FLUOROSCOPIC GUIDANCE COMPARISON:  CT 03/01/2021 FLUOROSCOPY: Radiation Exposure Index (as provided by the fluoroscopic device): 24.30 mGy Kerma PROCEDURE: Informed consent was obtained from the patient prior to the procedure, including potential complications of headache, allergy, and pain. With the patient prone, the lower back was prepped with Betadine. 1% Lidocaine was used for local anesthesia. Unsuccessful lumbar puncture attempt was made at L2-L3 level. Successful lumbar puncture was performed at the L4-L5 level using a 20 gauge needle with return of clear CSF with an opening pressure of 13 cm water. Approximately 8 ml of CSF were obtained for laboratory studies. The patient tolerated the procedure well and there were no apparent complications. IMPRESSION: Technically successful lumbar puncture at L4-L5 level. Opening pressure of 13 cm water, 8 mL of clear CSF collected and sent to the laboratory for analysis.  Electronically Signed   By: Maurine Simmering M.D.   On: 11/06/2022 13:27   DG Chest Port 1 View  Result Date: 11/05/2022 CLINICAL DATA:  Cough. EXAM: PORTABLE CHEST 1 VIEW COMPARISON:  Chest radiograph dated 10/17/2022. FINDINGS: Shallow inspiration. Diffuse chronic short coarsening. No focal consolidation, pleural effusion, or pneumothorax. Stable cardiac silhouette. No acute osseous pathology. Bilateral shoulder arthroplasties. IMPRESSION: No active disease. Electronically Signed   By: Anner Crete M.D.   On: 11/05/2022 23:06   CT Cervical Spine Wo Contrast  Result Date: 11/05/2022 CLINICAL DATA:  Acute posterior neck pain, fall EXAM: CT CERVICAL SPINE WITHOUT CONTRAST TECHNIQUE: Multidetector CT imaging of the cervical spine was performed without intravenous contrast. Multiplanar CT image reconstructions were also generated. RADIATION DOSE REDUCTION: This exam was performed according to the departmental dose-optimization program which includes automated  exposure control, adjustment of the mA and/or kV according to patient size and/or use of iterative reconstruction technique. COMPARISON:  None Available. FINDINGS: Alignment: Normal alignment.  No listhesis Skull base and vertebrae: Craniocervical alignment is normal. Atlantodental interval is not widened. No acute fracture of the cervical spine. Vertebral body height is preserved. Ankylosis of the C4-5 facet joints bilaterally. Soft tissues and spinal canal: No prevertebral fluid or swelling. No visible canal hematoma. Posterior disc osteophyte complex at C6-7 results in mild central canal stenosis with abutment and mild flattening of the thecal sac. AP diameter of the spinal canal at this level is approximately 7-8 mm. Milder changes are noted at C5-6. Disc levels: There is intervertebral disc space narrowing and endplate remodeling at M5-Y6 in keeping with changes of advanced degenerative disc disease. Milder degenerative changes are seen elsewhere within the cervical spine. Prevertebral soft tissues are not thickened on sagittal reformats. Multilevel uncovertebral and facet arthrosis results in multilevel moderate to severe neuroforaminal narrowing, most severe bilaterally at C3-4, C5-6 and C6-7. Upper chest: Negative. Other: None IMPRESSION: 1. No acute fracture or listhesis of the cervical spine. 2. Advanced multilevel degenerative disc and degenerative joint disease resulting in multilevel moderate to severe neuroforaminal narrowing, most severe bilaterally at C3-4, C5-6 and C6-7. 3. Mild central canal stenosis at C6-7. Electronically Signed   By: Fidela Salisbury M.D.   On: 11/05/2022 22:54   CT HEAD WO CONTRAST (5MM)  Result Date: 11/05/2022 CLINICAL DATA:  Mental status change, fall. EXAM: CT HEAD WITHOUT CONTRAST TECHNIQUE: Contiguous axial images were obtained from the base of the skull through the vertex without intravenous contrast. RADIATION DOSE REDUCTION: This exam was performed according to the  departmental dose-optimization program which includes automated exposure control, adjustment of the mA and/or kV according to patient size and/or use of iterative reconstruction technique. COMPARISON:  CT head 10/17/2022 FINDINGS: Brain: No evidence of acute infarction, hemorrhage, hydrocephalus, extra-axial collection or mass lesion/mass effect. Vascular: No hyperdense vessel or unexpected calcification. Skull: Normal. Negative for fracture or focal lesion. Sinuses/Orbits: No acute finding. Other: None. IMPRESSION: No acute intracranial abnormality. Electronically Signed   By: Ronney Asters M.D.   On: 11/05/2022 22:46   CT HEAD WO CONTRAST (5MM)  Result Date: 10/17/2022 CLINICAL DATA:  Mental status change, unknown cause EXAM: CT HEAD WITHOUT CONTRAST TECHNIQUE: Contiguous axial images were obtained from the base of the skull through the vertex without intravenous contrast. RADIATION DOSE REDUCTION: This exam was performed according to the departmental dose-optimization program which includes automated exposure control, adjustment of the mA and/or kV according to patient size and/or use of iterative reconstruction technique. COMPARISON:  Head CT 10/05/2022  FINDINGS: Brain: No intracranial hemorrhage, mass effect, or midline shift. Normal brain volume for age. No hydrocephalus. The basilar cisterns are patent. No evidence of territorial infarct or acute ischemia. No extra-axial or intracranial fluid collection. Vascular: No hyperdense vessel or unexpected calcification. Skull: No fracture or focal lesion. Sinuses/Orbits: No acute finding. Other: None. IMPRESSION: Stable head CT.  No acute intracranial abnormality. Electronically Signed   By: Keith Rake M.D.   On: 10/17/2022 21:27   DG Chest Portable 1 View  Result Date: 10/17/2022 CLINICAL DATA:  Altered mental status. Elevated white blood cell count. EXAM: PORTABLE CHEST 1 VIEW COMPARISON:  10/01/2021 FINDINGS: Stable cardiomediastinal contours. Mild  cardiac enlargement. No pleural effusion or edema. Mild chronic asymmetric elevation of the right hemidiaphragm. Chronic interstitial coarsening with scarring in the lower lung zones. No superimposed airspace disease. Previous bilateral shoulder arthroplasty. IMPRESSION: No acute findings. Chronic interstitial coarsening with lower lung zone scarring. Electronically Signed   By: Kerby Moors M.D.   On: 10/17/2022 19:15     Subjective: Patient seen and examined at bedside.  Overnight events noted.   Patient reports doing much better. He wanted to be discharged.  Denies any further urinary symptoms.   Patient is being discharged to SNF for rehab.  Discharge Exam: Vitals:   11/09/22 0525 11/09/22 0757  BP: 111/66 139/82  Pulse: 82 80  Resp: 16 16  Temp: 97.8 F (36.6 C) 97.7 F (36.5 C)  SpO2: 99% 100%   Vitals:   11/08/22 2115 11/08/22 2343 11/09/22 0525 11/09/22 0757  BP: 132/70 122/68 111/66 139/82  Pulse: 85 85 82 80  Resp: '19 17 16 16  '$ Temp: 98.2 F (36.8 C) 97.7 F (36.5 C) 97.8 F (36.6 C) 97.7 F (36.5 C)  TempSrc:  Oral  Oral  SpO2: 100% 100% 99% 100%  Weight:      Height:        General: Pt is alert, awake, not in acute distress Cardiovascular: RRR, S1/S2 +, no rubs, no gallops Respiratory: CTA bilaterally, no wheezing, no rhonchi Abdominal: Soft, NT, ND, bowel sounds + Extremities: no edema, no cyanosis    The results of significant diagnostics from this hospitalization (including imaging, microbiology, ancillary and laboratory) are listed below for reference.     Microbiology: Recent Results (from the past 240 hour(s))  Resp panel by RT-PCR (RSV, Flu A&B, Covid) Anterior Nasal Swab     Status: None   Collection Time: 11/05/22 10:01 PM   Specimen: Anterior Nasal Swab  Result Value Ref Range Status   SARS Coronavirus 2 by RT PCR NEGATIVE NEGATIVE Final    Comment: (NOTE) SARS-CoV-2 target nucleic acids are NOT DETECTED.  The SARS-CoV-2 RNA is generally  detectable in upper respiratory specimens during the acute phase of infection. The lowest concentration of SARS-CoV-2 viral copies this assay can detect is 138 copies/mL. A negative result does not preclude SARS-Cov-2 infection and should not be used as the sole basis for treatment or other patient management decisions. A negative result may occur with  improper specimen collection/handling, submission of specimen other than nasopharyngeal swab, presence of viral mutation(s) within the areas targeted by this assay, and inadequate number of viral copies(<138 copies/mL). A negative result must be combined with clinical observations, patient history, and epidemiological information. The expected result is Negative.  Fact Sheet for Patients:  EntrepreneurPulse.com.au  Fact Sheet for Healthcare Providers:  IncredibleEmployment.be  This test is no t yet approved or cleared by the [place] and  has been authorized for detection and/or diagnosis of SARS-CoV-2 by FDA under an Emergency Use Authorization (EUA). This EUA will remain  in effect (meaning this test can be used) for the duration of the COVID-19 declaration under Section 564(b)(1) of the Act, 21 U.S.C.section 360bbb-3(b)(1), unless the authorization is terminated  or revoked sooner.       Influenza A by PCR NEGATIVE NEGATIVE Final   Influenza B by PCR NEGATIVE NEGATIVE Final    Comment: (NOTE) The Xpert Xpress SARS-CoV-2/FLU/RSV plus assay is intended as an aid in the diagnosis of influenza from Nasopharyngeal swab specimens and should not be used as a sole basis for treatment. Nasal washings and aspirates are unacceptable for Xpert Xpress SARS-CoV-2/FLU/RSV testing.  Fact Sheet for Patients: EntrepreneurPulse.com.au  Fact Sheet for Healthcare Providers: IncredibleEmployment.be  This test is not yet approved or cleared by the [place] FDA  and has been authorized for detection and/or diagnosis of SARS-CoV-2 by FDA under an Emergency Use Authorization (EUA). This EUA will remain in effect (meaning this test can be used) for the duration of the COVID-19 declaration under Section 564(b)(1) of the Act, 21 U.S.C. section 360bbb-3(b)(1), unless the authorization is terminated or revoked.     Resp Syncytial Virus by PCR NEGATIVE NEGATIVE Final    Comment: (NOTE) Fact Sheet for Patients: EntrepreneurPulse.com.au  Fact Sheet for Healthcare Providers: IncredibleEmployment.be  This test is not yet approved or cleared by the [place] FDA and has been authorized for detection and/or diagnosis of SARS-CoV-2 by FDA under an Emergency Use Authorization (EUA). This EUA will remain in effect (meaning this test can be used) for the duration of the COVID-19 declaration under Section 564(b)(1) of the Act, 21 U.S.C. section 360bbb-3(b)(1), unless the authorization is terminated or revoked.  Performed at [facility], [address] [place], [place] [address]   Blood culture (routine x 2)     Status: None (Preliminary result)   Collection Time: 11/06/22 12:31 AM   Specimen: BLOOD  Result Value Ref Range Status   Specimen Description BLOOD LEFT FOREARM  Final   Special Requests   Final    BOTTLES DRAWN AEROBIC AND ANAEROBIC Blood Culture results may not be optimal due to an excessive volume of blood received in culture bottles   Culture   Final    NO GROWTH 3 DAYS Performed at [facility], [address] [place], [place] [address]    Report Status PENDING  Incomplete  Blood culture (routine x 2)     Status: None (Preliminary result)   Collection Time: 11/06/22 12:33 AM   Specimen: BLOOD  Result Value Ref Range Status   Specimen Description BLOOD LEFT ASSIST CONTROL  Final   Special Requests   Final    BOTTLES DRAWN AEROBIC AND ANAEROBIC Blood Culture results may not  be optimal due to an excessive volume of blood received in culture bottles   Culture   Final    NO GROWTH 3 DAYS Performed at [facility], [address] [place], [place] [address]    Report Status PENDING  Incomplete  Urine Culture     Status: Abnormal   Collection Time: 11/06/22  1:15 AM   Specimen: Urine, Clean Catch  Result Value Ref Range Status   Specimen Description   Final    URINE, CLEAN CATCH Performed at [facility], [address], [place], [place] [address]    Special Requests   Final    NONE Performed at [facility]  [facility], [address] [place], [place] [address]    Culture >=100,000 COLONIES/mL PSEUDOMONAS AERUGINOSA (A)  Final   Report Status 11/08/2022 FINAL  Final   Organism ID, Bacteria PSEUDOMONAS AERUGINOSA (A)  Final      Susceptibility   Pseudomonas aeruginosa - MIC*    CEFTAZIDIME 4 SENSITIVE Sensitive     CIPROFLOXACIN <=0.25 SENSITIVE Sensitive     GENTAMICIN <=1 SENSITIVE Sensitive     IMIPENEM 2 SENSITIVE Sensitive     PIP/TAZO 8 SENSITIVE Sensitive     CEFEPIME 2 SENSITIVE Sensitive     * >=100,000 COLONIES/mL PSEUDOMONAS AERUGINOSA  CSF culture w Gram Stain     Status: None   Collection Time: 11/06/22 11:20 AM   Specimen: PATH Cytology CSF; Cerebrospinal Fluid  Result Value Ref Range Status   Specimen Description   Final    CSF Performed at [facility], [address], [place], [place] [address]    Special Requests   Final    CYTO CSF Performed at [facility], [address] [place], [place] [address]    Gram Stain   Final    WBC PRESENT,BOTH PMN AND MONONUCLEAR NO ORGANISMS SEEN CYTOSPIN SMEAR    Culture   Final    NO GROWTH Performed at [facility], [place] [address], [place], [place] [address]    Report Status 11/09/2022 FINAL  Final  Culture, fungus without smear     Status: None (Preliminary result)   Collection Time: 11/06/22 11:20 AM   Specimen: PATH Cytology CSF;  Cerebrospinal Fluid  Result Value Ref Range Status   Specimen Description   Final    CSF Performed at [facility], [address] [place], [place] [address]    Special Requests   Final    CYTO CSF Performed at [facility], [address], [place], [place] [address]    Culture   Final    NO FUNGUS ISOLATED AFTER 2 DAYS Performed at [facility], [place] [address], [place], [place] [address]    Report Status PENDING  Incomplete  Anaerobic culture w Gram Stain     Status: None (Preliminary result)   Collection Time: 11/06/22 11:20 AM   Specimen: PATH Cytology CSF; Cerebrospinal Fluid  Result Value Ref Range Status   Specimen Description   Final    CSF Performed at [facility], [address] [place], [place] [address]    Special Requests   Final    CYTO CSF Performed at [facility], [address] [place], [place] [address]    Gram Stain   Final    WBC PRESENT, PREDOMINANTLY MONONUCLEAR NO ORGANISMS SEEN CYTOSPIN SMEAR Performed at [facility], [place] [address] [place], [place] [address]    Culture   Final    NO ANAEROBES ISOLATED; CULTURE IN PROGRESS FOR 5 DAYS   Report Status PENDING  Incomplete     Labs: BNP (last 3 results) Recent Labs    08/12/22 0623 11/07/22 0920  BNP 124.1* 956.3*   Basic Metabolic Panel: Recent Labs  Lab 11/05/22 2201 11/06/22 0509 11/07/22 0920 11/08/22 0409 11/09/22 0632  NA 139 136 140 139 140  K 3.5 3.5 3.0* 3.0* 3.8  CL 100 105 108 108 108  CO2 '29 23 26 28 27  '$ GLUCOSE 126* 160* 90 86 78  BUN '20 19 19 15 10  '$ CREATININE 1.06 0.77 0.79 0.75 0.77  CALCIUM 8.3* 6.9* 7.5* 7.6*  7.5*  MG  --   --  1.7 1.6* 1.9  PHOS  --   --  2.9 2.6 3.4   Liver Function Tests: Recent Labs  Lab 11/05/22 2201  AST 21  ALT 8  ALKPHOS 78  BILITOT 2.1*  PROT 6.9  ALBUMIN 2.7*   No results for input(s): "LIPASE", "AMYLASE" in the last 168 hours. No results for input(s): "AMMONIA" in the  last 168 hours. CBC: Recent Labs  Lab 11/05/22 2201 11/06/22 0509 11/07/22 0920 11/08/22 0409 11/09/22 0632  WBC 21.3* 14.1* 13.1* 9.7 6.9  NEUTROABS 17.0*  --   --   --   --   HGB 12.8* 9.5* 9.4* 9.4* 9.7*  HCT 40.3 30.0* 29.7* 29.3* 30.0*  MCV 97.3 99.3 97.7 95.8 94.9  PLT 242 173 202 206 186   Cardiac Enzymes: No results for input(s): "CKTOTAL", "CKMB", "CKMBINDEX", "TROPONINI" in the last 168 hours. BNP: Invalid input(s): "POCBNP" CBG: No results for input(s): "GLUCAP" in the last 168 hours. D-Dimer No results for input(s): "DDIMER" in the last 72 hours. Hgb A1c No results for input(s): "HGBA1C" in the last 72 hours. Lipid Profile No results for input(s): "CHOL", "HDL", "LDLCALC", "TRIG", "CHOLHDL", "LDLDIRECT" in the last 72 hours. Thyroid function studies No results for input(s): "TSH", "T4TOTAL", "T3FREE", "THYROIDAB" in the last 72 hours.  Invalid input(s): "FREET3" Anemia work up No results for input(s): "VITAMINB12", "FOLATE", "FERRITIN", "TIBC", "IRON", "RETICCTPCT" in the last 72 hours. Urinalysis    Component Value Date/Time   COLORURINE AMBER (A) 11/06/2022 0115   APPEARANCEUR HAZY (A) 11/06/2022 0115   LABSPEC 1.021 11/06/2022 0115   PHURINE 6.0 11/06/2022 0115   GLUCOSEU NEGATIVE 11/06/2022 0115   HGBUR MODERATE (A) 11/06/2022 0115   BILIRUBINUR NEGATIVE 11/06/2022 0115   KETONESUR NEGATIVE 11/06/2022 0115   PROTEINUR 30 (A) 11/06/2022 0115   UROBILINOGEN 0.2 07/14/2009 1503   NITRITE POSITIVE (A) 11/06/2022 0115   LEUKOCYTESUR MODERATE (A) 11/06/2022 0115   Sepsis Labs Recent Labs  Lab 11/06/22 0509 11/07/22 0920 11/08/22 0409 11/09/22 0632  WBC 14.1* 13.1* 9.7 6.9   Microbiology Recent Results (from the past 240 hour(s))  Resp panel by RT-PCR (RSV, Flu A&B, Covid) Anterior Nasal Swab     Status: None   Collection Time: 11/05/22 10:01 PM   Specimen: Anterior Nasal Swab  Result Value Ref Range Status   SARS Coronavirus 2 by RT PCR  NEGATIVE NEGATIVE Final    Comment: (NOTE) SARS-CoV-2 target nucleic acids are NOT DETECTED.  The SARS-CoV-2 RNA is generally detectable in upper respiratory specimens during the acute phase of infection. The lowest concentration of SARS-CoV-2 viral copies this assay can detect is 138 copies/mL. A negative result does not preclude SARS-Cov-2 infection and should not be used as the sole basis for treatment or other patient management decisions. A negative result may occur with  improper specimen collection/handling, submission of specimen other than nasopharyngeal swab, presence of viral mutation(s) within the areas targeted by this assay, and inadequate number of viral copies(<138 copies/mL). A negative result must be combined with clinical observations, patient history, and epidemiological information. The expected result is Negative.  Fact Sheet for Patients:  EntrepreneurPulse.com.au  Fact Sheet for Healthcare Providers:  IncredibleEmployment.be  This test is no t yet approved or cleared by the [place] FDA and  has been authorized for detection and/or diagnosis of SARS-CoV-2 by FDA under an Emergency Use Authorization (EUA). This EUA will remain  in effect (meaning this test can be used)  for the duration of the COVID-19 declaration under Section 564(b)(1) of the Act, 21 U.S.C.section 360bbb-3(b)(1), unless the authorization is terminated  or revoked sooner.       Influenza A by PCR NEGATIVE NEGATIVE Final   Influenza B by PCR NEGATIVE NEGATIVE Final    Comment: (NOTE) The Xpert Xpress SARS-CoV-2/FLU/RSV plus assay is intended as an aid in the diagnosis of influenza from Nasopharyngeal swab specimens and should not be used as a sole basis for treatment. Nasal washings and aspirates are unacceptable for Xpert Xpress SARS-CoV-2/FLU/RSV testing.  Fact Sheet for Patients: EntrepreneurPulse.com.au  Fact Sheet for  Healthcare Providers: IncredibleEmployment.be  This test is not yet approved or cleared by the [place] FDA and has been authorized for detection and/or diagnosis of SARS-CoV-2 by FDA under an Emergency Use Authorization (EUA). This EUA will remain in effect (meaning this test can be used) for the duration of the COVID-19 declaration under Section 564(b)(1) of the Act, 21 U.S.C. section 360bbb-3(b)(1), unless the authorization is terminated or revoked.     Resp Syncytial Virus by PCR NEGATIVE NEGATIVE Final    Comment: (NOTE) Fact Sheet for Patients: EntrepreneurPulse.com.au  Fact Sheet for Healthcare Providers: IncredibleEmployment.be  This test is not yet approved or cleared by the [place] FDA and has been authorized for detection and/or diagnosis of SARS-CoV-2 by FDA under an Emergency Use Authorization (EUA). This EUA will remain in effect (meaning this test can be used) for the duration of the COVID-19 declaration under Section 564(b)(1) of the Act, 21 U.S.C. section 360bbb-3(b)(1), unless the authorization is terminated or revoked.  Performed at [facility], [address] [place], [place] [address]   Blood culture (routine x 2)     Status: None (Preliminary result)   Collection Time: 11/06/22 12:31 AM   Specimen: BLOOD  Result Value Ref Range Status   Specimen Description BLOOD LEFT FOREARM  Final   Special Requests   Final    BOTTLES DRAWN AEROBIC AND ANAEROBIC Blood Culture results may not be optimal due to an excessive volume of blood received in culture bottles   Culture   Final    NO GROWTH 3 DAYS Performed at [facility], [address] [place], [place] [address]    Report Status PENDING  Incomplete  Blood culture (routine x 2)     Status: None (Preliminary result)   Collection Time: 11/06/22 12:33 AM   Specimen: BLOOD  Result Value Ref Range Status   Specimen  Description BLOOD LEFT ASSIST CONTROL  Final   Special Requests   Final    BOTTLES DRAWN AEROBIC AND ANAEROBIC Blood Culture results may not be optimal due to an excessive volume of blood received in culture bottles   Culture   Final    NO GROWTH 3 DAYS Performed at [facility], [address] [place], [place] [address]    Report Status PENDING  Incomplete  Urine Culture     Status: Abnormal   Collection Time: 11/06/22  1:15 AM   Specimen: Urine, Clean Catch  Result Value Ref Range Status   Specimen Description   Final    URINE, CLEAN CATCH Performed at [facility], [address], [place], [place] [address]    Special Requests   Final    NONE Performed at [facility], [address], [place],  [address]    Culture >=100,000 COLONIES/mL PSEUDOMONAS AERUGINOSA (A)  Final   Report Status 11/08/2022 FINAL  Final  Organism ID, Bacteria PSEUDOMONAS AERUGINOSA (A)  Final      Susceptibility   Pseudomonas aeruginosa - MIC*    CEFTAZIDIME 4 SENSITIVE Sensitive     CIPROFLOXACIN <=0.25 SENSITIVE Sensitive     GENTAMICIN <=1 SENSITIVE Sensitive     IMIPENEM 2 SENSITIVE Sensitive     PIP/TAZO 8 SENSITIVE Sensitive     CEFEPIME 2 SENSITIVE Sensitive     * >=100,000 COLONIES/mL PSEUDOMONAS AERUGINOSA  CSF culture w Gram Stain     Status: None   Collection Time: 11/06/22 11:20 AM   Specimen: PATH Cytology CSF; Cerebrospinal Fluid  Result Value Ref Range Status   Specimen Description   Final    CSF Performed at [facility], [address] [place], [place] [address]    Special Requests   Final    CYTO CSF Performed at [facility], [address] [place], [place] [address]    Gram Stain   Final    WBC PRESENT,BOTH PMN AND MONONUCLEAR NO ORGANISMS SEEN CYTOSPIN SMEAR    Culture   Final    NO GROWTH Performed at [facility], [place] [address] [place] [place], [place] [address]    Report Status 11/09/2022 FINAL  Final   Culture, fungus without smear     Status: None (Preliminary result)   Collection Time: 11/06/22 11:20 AM   Specimen: PATH Cytology CSF; Cerebrospinal Fluid  Result Value Ref Range Status   Specimen Description   Final    CSF Performed at [facility], [address] [place], [place] [address]    Special Requests   Final    CYTO CSF Performed at [facility], [address], [place], [place] [address]    Culture   Final    NO FUNGUS ISOLATED AFTER 2 DAYS Performed at [facility], [place] [address] [place], [place] [address]    Report Status PENDING  Incomplete  Anaerobic culture w Gram Stain     Status: None (Preliminary result)   Collection Time: 11/06/22 11:20 AM   Specimen: PATH Cytology CSF; Cerebrospinal Fluid  Result Value Ref Range Status   Specimen Description   Final    CSF Performed at [facility], [address], [place], [place] [address]    Special Requests   Final    CYTO CSF Performed at [facility], [address] [place], [place] [address]    Gram Stain   Final    WBC PRESENT, PREDOMINANTLY MONONUCLEAR NO ORGANISMS SEEN CYTOSPIN SMEAR Performed at [facility], [address] [place], [place] [address]    Culture   Final    NO ANAEROBES ISOLATED; CULTURE IN PROGRESS FOR 5 DAYS   Report Status PENDING  Incomplete     Time coordinating discharge: Over 30 minutes  SIGNED:   Shawna Clamp, MD  [facility] 11/09/2022, 11:58 AM Pager   If 7PM-7AM, please contact night-coverage

## 2022-11-11 LAB — CULTURE, BLOOD (ROUTINE X 2)
Culture: NO GROWTH
Culture: NO GROWTH

## 2022-11-11 LAB — ANAEROBIC CULTURE W GRAM STAIN

## 2022-11-12 ENCOUNTER — Telehealth: Payer: Self-pay | Admitting: *Deleted

## 2022-11-12 DIAGNOSIS — R197 Diarrhea, unspecified: Secondary | ICD-10-CM | POA: Diagnosis not present

## 2022-11-12 DIAGNOSIS — R54 Age-related physical debility: Secondary | ICD-10-CM | POA: Diagnosis not present

## 2022-11-12 DIAGNOSIS — M542 Cervicalgia: Secondary | ICD-10-CM | POA: Diagnosis not present

## 2022-11-12 DIAGNOSIS — I4891 Unspecified atrial fibrillation: Secondary | ICD-10-CM | POA: Diagnosis not present

## 2022-11-12 DIAGNOSIS — Z7901 Long term (current) use of anticoagulants: Secondary | ICD-10-CM | POA: Diagnosis not present

## 2022-11-12 DIAGNOSIS — I5032 Chronic diastolic (congestive) heart failure: Secondary | ICD-10-CM | POA: Diagnosis not present

## 2022-11-12 DIAGNOSIS — B965 Pseudomonas (aeruginosa) (mallei) (pseudomallei) as the cause of diseases classified elsewhere: Secondary | ICD-10-CM | POA: Diagnosis not present

## 2022-11-12 DIAGNOSIS — N390 Urinary tract infection, site not specified: Secondary | ICD-10-CM | POA: Diagnosis not present

## 2022-11-12 DIAGNOSIS — R519 Headache, unspecified: Secondary | ICD-10-CM | POA: Diagnosis not present

## 2022-11-12 DIAGNOSIS — R262 Difficulty in walking, not elsewhere classified: Secondary | ICD-10-CM | POA: Diagnosis not present

## 2022-11-12 DIAGNOSIS — E039 Hypothyroidism, unspecified: Secondary | ICD-10-CM | POA: Diagnosis not present

## 2022-11-12 NOTE — Progress Notes (Unsigned)
  Care Coordination  Outreach Note  11/12/2022 Name: Nicholas [name] MRN: [id number] DOB: [birth year]   Care Coordination Outreach Attempts: An unsuccessful telephone outreach was attempted today to offer the patient information about available care coordination services as a benefit of their health plan.   Follow Up Plan:  Additional outreach attempts will be made to offer the patient care coordination information and services.   Referral received   Encounter Outcome:  No Answer  Julian Hy, [place] Direct Dial: [phone]

## 2022-11-13 ENCOUNTER — Telehealth: Payer: Self-pay

## 2022-11-13 NOTE — Progress Notes (Unsigned)
  Chronic Care Management   Note  11/13/2022 Name: Nicholas [name] MRN: [id number] DOB: [birth year]  Nicholas [name] is a 72 y.o. year old male who is a primary care patient of [name], MD. I reached out to [name] by phone today in response to a referral sent by Nicholas [name]'s PCP.  The first contact attempt was unsuccessful.   Follow up plan: Additional outreach attempts will be made.  Noreene Larsson, [place], [place] [address] Direct Dial: [phone] [email or web address]

## 2022-11-14 ENCOUNTER — Telehealth: Payer: Self-pay | Admitting: *Deleted

## 2022-11-14 NOTE — Telephone Encounter (Signed)
PLEASE NOTE: All timestamps contained within this report are represented as [place] Standard Time. CONFIDENTIALTY NOTICE: This fax transmission is intended only for the addressee. It contains information that is legally privileged, confidential or otherwise protected from use or disclosure. If you are not the intended recipient, you are strictly prohibited from reviewing, disclosing, copying using or disseminating any of this information or taking any action in reliance on or regarding this information. If you have received this fax in error, please notify us immediately by telephone so that we can arrange for its return to [place]. Phone: [phone], Toll-Free: [phone], Fax: [phone] Page: 1 of 1 Call Id: [id number] [place] Night - Client Nonclinical Telephone Record  [facility] Client [place] Night - Client Client Site [place] Provider [name] - MD Contact Type Call Who Is Calling Patient / Member / Family / Caregiver Caller Name Nicholas [name] Caller Phone Number [phone] Call Type Message Only Information Provided Reason for Call Returning a Call from the Office Initial [name] states he is returning a call to the office Additional Comment Office hours provided Disp. Time Disposition Final User 11/13/2022 6:45:44 PM General Information Provided Yes Netta Corrigan Call Closed By: Netta Corrigan Transaction Date/Time: 11/13/2022 6:42:04 PM (ET)

## 2022-11-15 NOTE — Progress Notes (Signed)
  Chronic Care Management   Note  11/15/2022 Name: Nicholas [name] MRN: [id number] DOB: 1951/02/20  Nicholas [name] is a 72 y.o. year old male who is a primary care patient of [name], MD. I reached out to [name] by phone today in response to a referral sent by Nicholas [name]'s PCP.  Nicholas [name] was given information about Chronic Care Management services today including:  CCM service includes personalized support from designated clinical staff supervised by the physician, including individualized plan of care and coordination with other care providers 24/7 contact phone numbers for assistance for urgent and routine care needs. Service will only be billed when office clinical staff spend 20 minutes or more in a month to coordinate care. Only one practitioner may furnish and bill the service in a calendar month. The patient may stop CCM services at amy time (effective at the end of the month) by phone call to the office staff. The patient will be responsible for cost sharing (co-pay) or up to 20% of the service fee (after annual deductible is met)  Nicholas [name]  agreedto scheduling an appointment with the CCM RN Case Manager   Follow up plan: Patient agreed to scheduled appointment with RN Case Manager on 11/19/2022(date/time).   Noreene Larsson, [place], [place] [address] Direct Dial: [phone] [email or web address]

## 2022-11-15 NOTE — Progress Notes (Signed)
  Care Coordination   Note   11/15/2022 Name: DESTRY DAUBER MRN: [id number] DOB: Apr 05, 1951  TRAYTON SZABO is a 72 y.o. year old male who sees [name], MD for primary care. I reached out to [name] by phone today to offer care coordination services.  Mr. Werts was given information about Care Coordination services today including:   The Care Coordination services include support from the care team which includes your Nurse Coordinator, Clinical Social Worker, or Pharmacist.  The Care Coordination team is here to help remove barriers to the health concerns and goals most important to you. Care Coordination services are voluntary, and the patient may decline or stop services at any time by request to their care team member.   Care Coordination Consent Status: Patient agreed to services and verbal consent obtained.   Follow up plan:  Telephone appointment with care coordination team member scheduled for:  11/21/2022  Encounter Outcome:  Pt. Scheduled from referral   Julian Hy, [place] Direct Dial: [phone]

## 2022-11-19 ENCOUNTER — Telehealth: Payer: Self-pay | Admitting: *Deleted

## 2022-11-19 ENCOUNTER — Telehealth: Payer: 59

## 2022-11-19 NOTE — Telephone Encounter (Signed)
   CCM RN Visit Note   11/19/22 Name: [name] MRN: [id number]      DOB: 1951/08/14  Subjective: Nicholas [name] is a 72 y.o. year old male who is a primary care patient of [name], MD. The patient was referred to the Chronic Care Management team for assistance with care management needs subsequent to provider initiation of CCM services and plan of care.      Today's Visit: Engaged with patient by telephone for initial visit. RN care manager spoke with patient today who confirmed he is in an assisted living facility ([facility] in [place]).  Patient reports he is in process of being transferred to Long term care facility permanently.  Patient states he wants to keep his primary care provider while at this facility.  RN care manager attempted to collaborate with staff at [facility] to determine if patient's care continues to be provided by primary care provider and unable to speak with anyone other than receptionist and unable to leave voicemail.  RN care manager collaborated with director Janalyn Shy who states if patient is managed by his primary care provider while at assisted living, pt can be followed by CCM and collaborate with social worker for level of care needs, and for management/ education of CHF, A-fib, CKD. RN care manager informed pt that RN care manager would call him back today to complete the initial visit after collaboration with director. RN care manager unable to reach pt x 2 today.    Goals Addressed   None     Plan:Telephone follow up appointment with care management team member scheduled for:  upon care guide rescheduling.  Jacqlyn Larsen RNC, BSN RN Case Manager [place] [phone]

## 2022-11-20 ENCOUNTER — Other Ambulatory Visit: Payer: Self-pay | Admitting: Family Medicine

## 2022-11-20 ENCOUNTER — Encounter: Payer: Self-pay | Admitting: Neurology

## 2022-11-20 ENCOUNTER — Telehealth: Payer: Self-pay

## 2022-11-20 ENCOUNTER — Ambulatory Visit (INDEPENDENT_AMBULATORY_CARE_PROVIDER_SITE_OTHER): Payer: 59 | Admitting: Neurology

## 2022-11-20 VITALS — BP 101/53 | HR 86 | Ht 74.0 in

## 2022-11-20 DIAGNOSIS — G43109 Migraine with aura, not intractable, without status migrainosus: Secondary | ICD-10-CM

## 2022-11-20 DIAGNOSIS — K2960 Other gastritis without bleeding: Secondary | ICD-10-CM

## 2022-11-20 DIAGNOSIS — K269 Duodenal ulcer, unspecified as acute or chronic, without hemorrhage or perforation: Secondary | ICD-10-CM

## 2022-11-20 NOTE — Patient Instructions (Signed)
We will continue the Depakote Let us know if headaches increase  See you back in  1 year

## 2022-11-20 NOTE — Telephone Encounter (Signed)
Noted  

## 2022-11-20 NOTE — Progress Notes (Addendum)
Care Management & Coordination Services Pharmacy Team  Reason for Encounter: Medication Adherence and Delivery Coordination   Spoke with patient on 11/19/22    Contacted the patient and was informed that he currently resides in [facility] and awaiting long term care placement. The facility is taking care of medications for the patient     [name], PharmD notified  [name], [place] Assistant [phone]

## 2022-11-20 NOTE — Progress Notes (Signed)
HISTORICAL  Nicholas [name] is a 72 year old male, seen in request by his primary care physician Dr. [name] no frequent headaches, initial evaluation was on September 29, 2020.  I reviewed and summarized the referring note. hypothyroidism, on supplement,  HLD CHF CAD, Stroke in 2018, right side weakness Right knee replacement MRSA in 2009, surgery in 2010,  Left ulnar nerve transposition surgery  Bilateral shoulder replacement.  He reported a history of migraine headaches since 72 years old, his typical migraine is holoacranial severe pounding headache with associated light, noise sensitivity, nauseous, over the years, he was treated with different over-the-counter medications, and triptan treatment without significant improvement, in his 76s, he was put on different preventive medications as well, there was no significant change per patient  He reported a history of heart attack, stroke, also had a history of difficult right knee replacement, complicated by prolonged MRSA infection, require prolonged antibiotic treatment.  He now complains of 1 to twice migraine headache each week, bilateral temporal retro-orbital region, sharp, moderate severe pain, with associated light, noise sensitivity, nauseous, lasting 1 to few days, Imitrex 100 mg provides limited help,  I personally reviewed MRI of the brain with and without contrast in November 2021, 10 mm hypoenhancing lesion within the left side of the pituitary gland likely representing a pituitary micro adenoma. Mild deviation of the pituitary stalk to the right. No suprasellar component.  Mild supratentorium small vessel disease,  He denies auras with his migraine  Update January 05, 2021 SS: Here today alone, lives in senior living apartment. Added Depakote ER 500 mg at bedtime last time, remains on gabapentin, metoprolol. Migraines used to be 1-2 a week, now 1 every 2-3 weeks. The Roselyn Meier works quickly, takes Zofran, tizanidine.   Labs reviewed from PCP February 2022, CMP looked okay.   Update January 16, 2022 SS: Here today alone, stopped the Depakote, unsure why or when (Dec 2022 note from pharmacy he was off > 6 months wasn't taking correctly). Right now 2-3 migraines a month, takes Tylenol for headache without help, lays down, can take a few days to pass. On amitriptyline for sleep; also gabapentin, Toprol-XL that could benefit migraines . Right now allergies trigger migraines. CT head in May 2022 no acute problem, stable. Seeing [name] about foot ulcer left, may need amputation of great toe.   Update July 24, 2022 SS: Taking Depakote ER 500 mg daily for migraine, is about 50% helpful. Still having about 2-3 migraines a month, can last a few days. The Roselyn Meier helps for few hours, then returns, clusters over a few days. Reports he has noted occasional body jerks since being on Depakote, side effect? No tremor was noted.  Update November 20, 2022 SS: Here with his sister, is now at SNF at [facility], due to multiple falls. Trying to get placement at long term care facility. AFIB with cardioversion Nov 2023, COVID oct 2023, bacteremia in November, Flu in December. Has not had any migraines, it is still on his medication list. Jan 2024, negative for meningitis. He feels well now, feels the best he has felt.   REVIEW OF SYSTEMS: Full 14 system review of systems performed and notable only for as above.  See HPI  ALLERGIES: Allergies  Allergen Reactions   Peanut-Containing Drug Products Anaphylaxis and Dermatitis   Yellow Jacket Venom [Bee Venom] Anaphylaxis and Other (See Comments)    Respiratory Distress   Celebrex [Celecoxib] Other (See Comments)    BLACK STOOL?MELENA?  Percocet [Oxycodone-Acetaminophen] Hives, Swelling and Other (See Comments)   Latex Rash   Nsaids Rash    HOME MEDICATIONS: Current Outpatient Medications  Medication Sig Dispense Refill   acetaminophen (TYLENOL) 500 MG tablet Take 1,000 mg by  mouth 3 (three) times daily.     amitriptyline (ELAVIL) 50 MG tablet Take 1 tablet (50 mg total) by mouth at bedtime. 90 tablet 1   apixaban (ELIQUIS) 5 MG TABS tablet Take 1 tablet (5 mg total) by mouth 2 (two) times daily. 60 tablet 3   atorvastatin (LIPITOR) 20 MG tablet TAKE ONE TABLET BY MOUTH ONCE DAILY 90 tablet 3   B Complex Vitamins (B-COMPLEX/B-12 PO) Take 1 tablet by mouth daily.     cetirizine (ZYRTEC) 10 MG tablet Take 1 tablet (10 mg total) by mouth daily. 30 tablet 0   Cholecalciferol (VITAMIN D3) 25 MCG (1000 UT) CAPS Take 1 capsule (1,000 Units total) by mouth daily. 90 capsule 1   divalproex (DEPAKOTE ER) 500 MG 24 hr tablet TAKE ONE TABLET BY MOUTH EVERYDAY AT BEDTIME 30 tablet 5   EPINEPHrine 0.3 mg/0.3 mL IJ SOAJ injection Inject 0.3 mg into the muscle as needed for anaphylaxis. (bee stings) 2 each 1   ferrous sulfate 325 (65 FE) MG EC tablet Take 325 mg by mouth daily with breakfast.     fluticasone (FLONASE) 50 MCG/ACT nasal spray Place 2 sprays into both nostrils daily. 16 g 0   furosemide (LASIX) 20 MG tablet TAKE ONE TABLET BY MOUTH EVERY MORNING 90 tablet 1   gabapentin (NEURONTIN) 300 MG capsule TAKE ONE CAPSULE BY MOUTH EVERY MORNING and TAKE TWO CAPSULES BY MOUTH AT NOON and TAKE TWO CAPSULES BY MOUTH EVERY EVENING 450 capsule 1   levothyroxine (SYNTHROID) 112 MCG tablet Take 1 tablet (112 mcg total) by mouth daily before breakfast. 90 tablet 3   metoprolol succinate (TOPROL-XL) 100 MG 24 hr tablet Take 1 tablet (100 mg total) by mouth at bedtime. Take with or immediately following a meal.     nitroGLYCERIN (NITROSTAT) 0.4 MG SL tablet Place 1 tablet (0.4 mg total) under the tongue every 5 (five) minutes as needed for chest pain. 20 tablet 3   ondansetron (ZOFRAN-ODT) 4 MG disintegrating tablet TAKE ONE TABLET BY MOUTH every EIGHT hours AS NEEDED 20 tablet 3   pantoprazole (PROTONIX) 40 MG tablet Take 40 mg by mouth every other day.     tamsulosin (FLOMAX) 0.4 MG CAPS  capsule TAKE ONE CAPSULE BY MOUTH ONCE DAILY 30 capsule 0   tiZANidine (ZANAFLEX) 4 MG tablet TAKE ONE TABLET BY MOUTH every SIX hours AS NEEDED FOR acute HEADACHE 20 tablet 1   Ubrogepant (UBRELVY) 100 MG TABS Take 100 mg by mouth as needed (take 1 tablet at onset of headache, may repeat in hours, max is 200 mg in 24 hours). (Patient not taking: Reported on 11/20/2022) 12 tablet 11   No current facility-administered medications for this visit.    PAST MEDICAL HISTORY: Past Medical History:  Diagnosis Date   Acute kidney failure 08/2008   "cleared up"  no problems since   Allergy    Anxiety    Arthritis    Asthma    ?of this no inhaler   Blood transfusion    CHF (congestive heart failure) ([place])    ?of this, pt denies   CLOSTRIDIUM DIFFICILE COLITIS 07/04/2010   Annotation: 12/09, 2/10 Qualifier: Diagnosis of  By: [name], [name]     COVID-19 08/12/22 completed Paxlovid  08/20/22 08/26/2022   Depression with anxiety    Duodenitis determined by biopsy 02/2016   peptic likely due to aleve (erosive gastropathy with duodenal erosions)   ECZEMA 07/04/2010   Qualifier: Diagnosis of  By: [name], [name]     Elevated liver enzymes    Fatty liver    Full dentures    GERD (gastroesophageal reflux disease)    Heart attack ([place])    08/2008 (likely demand ischemia in the setting of MSSA sepsis/R TKA  infection)10-2008   History of hiatal hernia    History of stomach ulcers    Hypothyroidism    Lower GI bleeding    MALAR AND MAXILLARY BONES CLOSED FRACTURE 07/04/2010   Annotation: ORIF Qualifier: Diagnosis of  By: [name], [name]     Migraine    "definitely"   MRSA (methicillin resistant Staphylococcus aureus)    in leg, had to place steel rod in leg   Personal history of colonic adenoma 06/01/2003   PFO (patent foramen ovale)    small PFO by 08/2008 TEE   Pneumonia 08/2008   "while in ICU"   Pneumonia due to COVID-19 virus 08/12/2022   Seizures ([place]) 2009   "long time ago"      Sleep apnea    does not wear CPAP   Stroke ([place]) 2010   unable to complete sentences at times   Wears glasses     PAST SURGICAL HISTORY: Past Surgical History:  Procedure Laterality Date   AMPUTATION Left 01/24/2022   Procedure: LEFT GREAT TOE AMPUTATION AT METATARSOPHALANGEAL JOINT AND SECOND TOE AMPUTATION;  Surgeon: Newt Minion, MD;  Location: [place];  Service: Orthopedics;  Laterality: Left;   anterior  nerve transposition  07/2009   left ulnar nerve   antibiotic spacer exchange  11/2008; 08/2006   right knee   ARTHROTOMY  08/2008   right knee w/I&D   CARDIOVASCULAR STRESS TEST  2013   stress EKG - negative for ischemia, 4 min 7.3 METs, normal blood pressure response   CARDIOVERSION N/A 08/24/2022   Procedure: CARDIOVERSION;  Surgeon: Minna Merritts, MD;  Location: [facility];  Service: Cardiovascular;  Laterality: N/A;   CATARACT EXTRACTION     COLONOSCOPY  11/2012   diverticulosis, rpt 10 yrs [name])   ESOPHAGOGASTRODUODENOSCOPY  02/2016   erosive gastropathy with duodenal erosions [name])   HARDWARE REMOVAL  04/2006   right knee w/antibiotic spacers placed   INGUINAL HERNIA REPAIR  early 1990's   bilateral   JOINT REPLACEMENT Bilateral    KNEE ARTHROSCOPY  10/2001   right   KNEE FUSION  03/2009   right knee removal; antibiotic spacers;    LEFT HEART CATH AND CORONARY ANGIOGRAPHY N/A 02/04/2018    WNL (Patwardhan, Reynold Bowen, MD)   LUMBAR LAMINECTOMY/DECOMPRESSION MICRODISCECTOMY N/A 12/05/2018   Procedure: LUMBAR THREE TO FOUR AND LUMBAR FOUR TO FIVE DECOMPRESSION;  Surgeon: [name], MD;  Location: [place];  Service: Orthopedics;  Laterality: N/A;   MULTIPLE TOOTH EXTRACTIONS     REPLACEMENT TOTAL KNEE  08/2008; 09/2001   right   REVERSE SHOULDER ARTHROPLASTY Left 09/03/2017   Procedure: REVERSE SHOULDER ARTHROPLASTY;  Surgeon: Meredith Pel, MD;  Location: [place];  Service: Orthopedics;  Laterality: Left;   REVERSE SHOULDER ARTHROPLASTY Right 05/13/2018    Procedure: RIGHT REVERSE SHOULDER ARTHROPLASTY;  Surgeon: Meredith Pel, MD;  Location: [place];  Service: Orthopedics;  Laterality: Right;   rod placement Right 03/2009   R knee  SYNOVECTOMY  06/2005   debridement, liner exchange right knee   TEE WITHOUT CARDIOVERSION N/A 08/24/2022   Procedure: TRANSESOPHAGEAL ECHOCARDIOGRAM (TEE);  Surgeon: Minna Merritts, MD;  Location: [facility];  Service: Cardiovascular;  Laterality: N/A;   TOE AMPUTATION Left 08/2008   great toe - osteomyelitis (staph infection)   TOE AMPUTATION Left 04/2016   2nd toe [name]   TONSILLECTOMY     TOTAL KNEE ARTHROPLASTY Left 01/15/2017   TOTAL KNEE ARTHROPLASTY Left 01/15/2017   Procedure: TOTAL KNEE ARTHROPLASTY;  Surgeon: Meredith Pel, MD;  Location: [place];  Service: Orthopedics;  Laterality: Left;    FAMILY HISTORY: Family History  Problem Relation Age of Onset   Coronary artery disease Father 44   Stroke Father    CAD Mother 3       stents   Breast cancer Sister    Diabetes Brother    Diabetes Sister    Colon cancer Neg Hx    Stomach cancer Neg Hx     SOCIAL HISTORY: Social History   Socioeconomic History   Marital status: Widowed    Spouse name: Not on file   Number of children: 2   Years of education: 39   Highest education level: Not on file  Occupational History   Occupation: retired  Tobacco Use   Smoking status: Never    Passive exposure: Past   Smokeless tobacco: Never  Vaping Use   Vaping Use: Never used  Substance and Sexual Activity   Alcohol use: No    Alcohol/week: 0.0 standard drinks of alcohol    Comment: "I abused alcohol; last drink  ~ 2005"   Drug use: No   Sexual activity: Not Currently  Other Topics Concern   Not on file  Social History Narrative   Widower 2016 - wife passed from ESRD related illness   Lives in [place] in [place].    Mormon      Screened positive for OSA 04/2018   Social Determinants of Health   Financial Resource  Strain: Medium Risk (02/28/2022)   Overall Financial Resource Strain (CARDIA)    Difficulty of Paying Living Expenses: Somewhat hard  Food Insecurity: No Food Insecurity (11/07/2022)   Hunger Vital Sign    Worried About Running Out of Food in the Last Year: Never true    Ran Out of Food in the Last Year: Never true  Transportation Needs: Unmet Transportation Needs (11/07/2022)   PRAPARE - Transportation    Lack of Transportation (Medical): Yes    Lack of Transportation (Non-Medical): Yes  Physical Activity: Insufficiently Active (02/28/2022)   Exercise Vital Sign    Days of Exercise per Week: 2 days    Minutes of Exercise per Session: 20 min  Stress: No Stress Concern Present (02/28/2022)   [place]    Feeling of Stress : Not at all  Social Connections: Moderately Isolated (02/28/2022)   Social Connection and Isolation Panel [NHANES]    Frequency of Communication with Friends and Family: More than three times a week    Frequency of Social Gatherings with Friends and Family: Once a week    Attends Religious Services: More than 4 times per year    Active Member of [employer] or Organizations: No    Attends Archivist Meetings: Never    Marital Status: Widowed  Intimate Partner Violence: Not At Risk (11/07/2022)   Humiliation, Afraid, Rape, and Kick questionnaire    Fear  of Current or Ex-Partner: No    Emotionally Abused: No    Physically Abused: No    Sexually Abused: No   PHYSICAL EXAM   Vitals:   11/20/22 1259  BP: (!) 101/53  Pulse: 86  Height: '6\' 2"'$  (1.88 m)   Not recorded    Body mass index is 28.11 kg/m.  Physical Exam  General: The patient is alert and cooperative at the time of the examination.  Neurologic Exam  Mental status: The patient is alert and oriented x 3 at the time of the examination. The patient has apparent normal recent and remote memory, with an apparently normal attention span  and concentration ability.  Cranial nerves: Facial symmetry is present. Speech is normal, no aphasia or dysarthria is noted. Extraocular movements are full. Visual fields are full.  Motor: The patient has good strength, exception right lower extremity is extended, strength is 2/5  Sensory examination: Soft touch sensation is symmetric on the face, arms, and legs.  Coordination: The patient has good finger-nose-finger bilaterally, trouble lifting legs   Gait and station: In a wheelchair, he is able to stand with pushoff independently, was not ambulated since walker was not available  DIAGNOSTIC DATA (LABS, IMAGING, TESTING) - I reviewed patient records, labs, notes, testing and imaging myself where available.  ASSESSMENT AND PLAN  Nicholas [name] is a 72 y.o. male    1. Chronic migraine headache  Since seen, he is now residing at [facility] after several ER visits and hospitalization for infection, falls.  Fortunately, his migraines are doing very well.  He will remain on Depakote ER 500 mg daily for migraine preventative.  He never started the Ajovy, we can consider in the future if needed.  He may utilize [place] for acute headache treatment if needed. MRI of brain showed 10 mm hypodensity pituitary microadenoma, less likely contributed to his lifelong history of migraine headaches.  He will follow-up in 1 year or sooner if needed.  He looks very well today, indicates he is doing well.   Evangeline Dakin, DNP  [facility] [address] [place], [place] [address] [phone]

## 2022-11-20 NOTE — Telephone Encounter (Signed)
E-scribed refills.  Pt scheduled for [facility] wellness on 03/04/23. Plz scheduled CPE and lab visits after this date to prevent delays in future refills.

## 2022-11-20 NOTE — Telephone Encounter (Signed)
Patient has been scheduled.

## 2022-11-21 ENCOUNTER — Ambulatory Visit: Payer: Self-pay | Admitting: *Deleted

## 2022-11-21 NOTE — Patient Outreach (Addendum)
  Care Coordination   Initial Visit Note   11/21/2022 Name: CLAUDIS GIOVANELLI MRN: [id number] DOB: Jan 04, 1951  LENNY FIUMARA is a 72 y.o. year old male who sees [name], MD for primary care. I spoke with  [name] by phone today.  What matters to the patients health and wellness today?  Patient confirmed that he is currently at [facility] for short term rehab with plan to transition to long term care either there or at another facility. Patient confirms that his son and discharge planner at the facility are currently assisting with this. This Education officer, museum enocuraged continued folloe up with discharge planner to assist with the special assistance Medicaid process and coordination of long term care placement. Patient verbalized having no additional needs at this time   Goals Addressed   None     SDOH assessments and interventions completed:  Yes  SDOH Interventions Today    Flowsheet Row Most Recent Value  SDOH Interventions   Food Insecurity Interventions Intervention Not Indicated  Housing Interventions Intervention Not Indicated  Transportation Interventions Other (Comment)  [patient now in short-term rehab with plan to transition into long term care]        Care Coordination Interventions:  Yes, provided  Interventions Today    Flowsheet Row Most Recent Value  Chronic Disease Discussed/Reviewed   Chronic disease discussed/reviewed during today's visit Other  General Interventions   General Interventions Discussed/Reviewed General Interventions Discussed       Follow up plan: No further intervention required.   Encounter Outcome:  Pt. Visit Completed

## 2022-11-22 DIAGNOSIS — L97523 Non-pressure chronic ulcer of other part of left foot with necrosis of muscle: Secondary | ICD-10-CM | POA: Diagnosis not present

## 2022-11-22 DIAGNOSIS — I5023 Acute on chronic systolic (congestive) heart failure: Secondary | ICD-10-CM | POA: Diagnosis not present

## 2022-11-22 DIAGNOSIS — M19011 Primary osteoarthritis, right shoulder: Secondary | ICD-10-CM | POA: Diagnosis not present

## 2022-11-22 DIAGNOSIS — M6281 Muscle weakness (generalized): Secondary | ICD-10-CM | POA: Diagnosis not present

## 2022-11-22 DIAGNOSIS — R262 Difficulty in walking, not elsewhere classified: Secondary | ICD-10-CM | POA: Diagnosis not present

## 2022-11-27 LAB — CULTURE, FUNGUS WITHOUT SMEAR

## 2022-11-29 ENCOUNTER — Encounter: Payer: 59 | Admitting: Pharmacist

## 2022-12-11 ENCOUNTER — Ambulatory Visit (INDEPENDENT_AMBULATORY_CARE_PROVIDER_SITE_OTHER): Payer: 59 | Admitting: *Deleted

## 2022-12-11 DIAGNOSIS — I5032 Chronic diastolic (congestive) heart failure: Secondary | ICD-10-CM

## 2022-12-11 DIAGNOSIS — N1831 Chronic kidney disease, stage 3a: Secondary | ICD-10-CM

## 2022-12-11 DIAGNOSIS — I4891 Unspecified atrial fibrillation: Secondary | ICD-10-CM

## 2022-12-11 NOTE — Chronic Care Management (AMB) (Signed)
Chronic Care Management   CCM RN Visit Note  12/11/2022 Name: Nicholas [name] MRN: [id number] DOB: 05/21/1951  Subjective: Nicholas [name] is a 72 y.o. year old male who is a primary care patient of [name], MD. The patient was referred to the Chronic Care Management team for assistance with care management needs subsequent to provider initiation of CCM services and plan of care.    Today's Visit:  Engaged with patient by telephone for initial visit.     SDOH Interventions Today    Flowsheet Row Most Recent Value  SDOH Interventions   Food Insecurity Interventions Intervention Not Indicated  Housing Interventions Intervention Not Indicated  Transportation Interventions Intervention Not Indicated  Utilities Interventions Intervention Not Indicated  Financial Strain Interventions Intervention Not Indicated  Physical Activity Interventions Intervention Not Indicated  Stress Interventions Intervention Not Indicated  Social Connections Interventions Intervention Not Indicated         Goals Addressed             This Visit's Progress    CCM (ATRIAL FIBRILLATION) EXPECTED OUTCOME: MONITOR, SELF-MANAGE AND REDUCE SYMPTOMS OF ATRIAL FIBRILLATION       Current Barriers:  Knowledge Deficits related to Atrial Fibrillation management Chronic Disease Management support and education needs related to Atrial Fibrillation/ action plan Patient reports he feels Atrial Fibrillation  "is under good control right now"  Planned Interventions: Reviewed importance of adherence to anticoagulant exactly as prescribed Advised patient to discuss any change in health status, exacerbation of Atrial Fibrillation with provider Counseled on avoidance of NSAIDs due to increased bleeding risk with anticoagulants Counseled on importance of regular laboratory monitoring as prescribed Afib action plan reviewed Screening for signs and symptoms of depression related to chronic disease state Assessed  social determinant of health barriers  Symptom Management: Take medications as prescribed   Attend all scheduled provider appointments Attend church or other social activities Work with the social worker to address care coordination needs and will continue to work with the clinical team to address health care and disease management related needs - check pulse (heart) rate before taking medicine - check pulse (heart) rate once a day - make a plan to exercise regularly - make a plan to eat healthy - keep all lab appointments - take medicine as prescribed - wear medical alert identification  Follow Up Plan: Telephone follow up appointment with care management team member scheduled for:  02/06/23 at 130 pm       CCM (CHRONIC KIDNEY DISEASE) EXPECTED OUTCOME: MONITOR, SELF-MANAGE AND REDUCE SYMPTOMS OF CHRONIC KIDNEY DISEASE       Current Barriers:  Knowledge Deficits related to Chronic kidney disease management Chronic Disease Management support and education needs related to Chronic kidney disease Patient reports blood pressure is checked at facility, pt reports all needs are managed by the facility Social worker is working with patient   Planned Interventions: Assessed the patient     understanding of chronic kidney disease    Evaluation of current treatment plan related to chronic kidney disease self management and patient's adherence to plan as established by provider      Reviewed prescribed diet low sodium Reviewed medications with patient and discussed importance of compliance    Counseled on the importance of exercise goals with target of 150 minutes per week     Advised patient, providing education and rationale, to monitor blood pressure daily and record, calling PCP for findings outside established parameters    Discussed complications of poorly  controlled blood pressure such as heart disease, stroke, circulatory complications, vision complications, kidney impairment, sexual  dysfunction    Discussed plans with patient for ongoing care management follow up and provided patient with direct contact information for care management team    Screening for signs and symptoms of depression related to chronic disease state       Symptom Management: Take medications as prescribed   Attend all scheduled provider appointments Attend church or other social activities Work with the social worker to address care coordination needs and will continue to work with the clinical team to address health care and disease management related needs Follow a healthy diet Choose water to drink Follow low sodium diet  Follow Up Plan: Telephone follow up appointment with care management team member scheduled for:  02/06/23 at 130 pm       CCM ([place]) EXPECTED OUTCOME: MONITOR, SELF-MANAGE AND REDUCE SYMPTOMS OF CONGESTIVE HEART FAILURE       Current Barriers:  Knowledge Deficits related to Congestive heart failure management Care Coordination needs related to needs for assistance with ADL's, IADL's, medication management, collaboration with social worker  in a patient with Congestive heart failure Chronic Disease Management support and education needs related to Congestive heart failure No Advanced Directives in place- pt requests documents be mailed to facility Patient reports he resides at [facility] in [place], was in process of moving to LTC facility but now will not be moving, will remain at ALF and will continue to see his primary care provider Patient reports he had one fall in the past year Patient reports facility takes care of all his needs, medication administration, daily weights, meals, bathing, etc.  Patient has a son that assists/ provides oversight as needed and church member takes patient to church on Sunday  Planned Interventions: Basic overview and discussion of pathophysiology of Heart Failure reviewed Provided  education on low sodium diet Reviewed Heart Failure Action Plan in depth and provided written copy Assessed need for readable accurate scales in home Provided education about placing scale on hard, flat surface Advised patient to weigh each morning after emptying bladder Discussed importance of daily weight and advised patient to weigh and record daily Discussed the importance of keeping all appointments with provider Provided patient with education about the role of exercise in the management of heart failure Screening for signs and symptoms of depression related to chronic disease state  Assessed social determinant of health barriers Advanced directives packet mailed to facility Safety precautions reviewed  Symptom Management: Take medications as prescribed   Attend all scheduled provider appointments Attend church or other social activities Call provider office for new concerns or questions  Work with the social worker to address care coordination needs and will continue to work with the clinical team to address health care and disease management related needs call office if I gain more than 2 pounds in one day or 5 pounds in one week keep legs up while sitting track weight in diary use salt in moderation watch for swelling in feet, ankles and legs every day weigh myself daily develop a rescue plan follow rescue plan if symptoms flare-up eat more whole grains, fruits and vegetables, lean meats and healthy fats dress right for the weather, hot or cold Look over education mailed- Heart Failure action plan Advanced directives mailed- please look over and complete fall prevention strategies: change position slowly, use assistive device such as walker or cane (per provider recommendations) when  walking, keep walkways clear, have good lighting in room. It is important to contact your provider if you have any falls, maintain muscle strength/tone by exercise per provider  recommendations.  Follow Up Plan: Telephone follow up appointment with care management team member scheduled for:  02/06/23 at 130 pm          Plan:Telephone follow up appointment with care management team member scheduled for:  02/06/23 at 39 PM  Jacqlyn Larsen [facility], BSN RN Case Manager [place] [phone]

## 2022-12-11 NOTE — Patient Instructions (Signed)
Please call the care guide team at [phone] if you need to cancel or reschedule your appointment.   If you are experiencing a Mental Health or [place] or need someone to talk to, please call the Suicide and Crisis Lifeline: 988 call the [place] National Suicide Prevention Lifeline: 639-023-8769 or TTY: 843-698-7266 TTY 908-201-6440) to talk to a trained counselor call 1-800-273-TALK (toll free, 24 hour hotline) go to [facility] [address], [place] [phone]) call 911   Following is a copy of the CCM Program Consent:  CCM service includes personalized support from designated clinical staff supervised by the physician, including individualized plan of care and coordination with other care providers 24/7 contact phone numbers for assistance for urgent and routine care needs. Service will only be billed when office clinical staff spend 20 minutes or more in a month to coordinate care. Only one practitioner may furnish and bill the service in a calendar month. The patient may stop CCM services at amy time (effective at the end of the month) by phone call to the office staff. The patient will be responsible for cost sharing (co-pay) or up to 20% of the service fee (after annual deductible is met)  Following is a copy of your full provider care plan:   Goals Addressed             This Visit's Progress    CCM (ATRIAL FIBRILLATION) EXPECTED OUTCOME: MONITOR, SELF-MANAGE AND REDUCE SYMPTOMS OF ATRIAL FIBRILLATION       Current Barriers:  Knowledge Deficits related to Atrial Fibrillation management Chronic Disease Management support and education needs related to Atrial Fibrillation/ action plan Patient reports he feels Atrial Fibrillation  "is under good control right now"  Planned Interventions: Reviewed importance of adherence to anticoagulant exactly as prescribed Advised patient to discuss any change in health status,  exacerbation of Atrial Fibrillation with provider Counseled on avoidance of NSAIDs due to increased bleeding risk with anticoagulants Counseled on importance of regular laboratory monitoring as prescribed Afib action plan reviewed Screening for signs and symptoms of depression related to chronic disease state Assessed social determinant of health barriers  Symptom Management: Take medications as prescribed   Attend all scheduled provider appointments Attend church or other social activities Work with the social worker to address care coordination needs and will continue to work with the clinical team to address health care and disease management related needs - check pulse (heart) rate before taking medicine - check pulse (heart) rate once a day - make a plan to exercise regularly - make a plan to eat healthy - keep all lab appointments - take medicine as prescribed - wear medical alert identification  Follow Up Plan: Telephone follow up appointment with care management team member scheduled for:  02/06/23 at 130 pm       CCM (CHRONIC KIDNEY DISEASE) EXPECTED OUTCOME: MONITOR, SELF-MANAGE AND REDUCE SYMPTOMS OF CHRONIC KIDNEY DISEASE       Current Barriers:  Knowledge Deficits related to Chronic kidney disease management Chronic Disease Management support and education needs related to Chronic kidney disease Patient reports blood pressure is checked at facility, pt reports all needs are managed by the facility Social worker is working with patient   Planned Interventions: Assessed the patient     understanding of chronic kidney disease    Evaluation of current treatment plan related to chronic kidney disease self management and patient's adherence to plan as established by provider  Reviewed prescribed diet low sodium Reviewed medications with patient and discussed importance of compliance    Counseled on the importance of exercise goals with target of 150 minutes per week      Advised patient, providing education and rationale, to monitor blood pressure daily and record, calling PCP for findings outside established parameters    Discussed complications of poorly controlled blood pressure such as heart disease, stroke, circulatory complications, vision complications, kidney impairment, sexual dysfunction    Discussed plans with patient for ongoing care management follow up and provided patient with direct contact information for care management team    Screening for signs and symptoms of depression related to chronic disease state       Symptom Management: Take medications as prescribed   Attend all scheduled provider appointments Attend church or other social activities Work with the social worker to address care coordination needs and will continue to work with the clinical team to address health care and disease management related needs Follow a healthy diet Choose water to drink Follow low sodium diet  Follow Up Plan: Telephone follow up appointment with care management team member scheduled for:  02/06/23 at 130 pm       CCM ([place]) EXPECTED OUTCOME: MONITOR, SELF-MANAGE AND REDUCE SYMPTOMS OF CONGESTIVE HEART FAILURE       Current Barriers:  Knowledge Deficits related to Congestive heart failure management Care Coordination needs related to needs for assistance with ADL's, IADL's, medication management, collaboration with social worker  in a patient with Congestive heart failure Chronic Disease Management support and education needs related to Congestive heart failure No Advanced Directives in place- pt requests documents be mailed to facility Patient reports he resides at [facility] and [facility] in [place], was in process of moving to LTC facility but now will not be moving, will remain at ALF and will continue to see his primary care provider Patient reports he had one fall in the past year Patient reports facility  takes care of all his needs, medication administration, daily weights, meals, bathing, etc.  Patient has a son that assists/ provides oversight as needed and church member takes patient to church on Sunday  Planned Interventions: Basic overview and discussion of pathophysiology of Heart Failure reviewed Provided education on low sodium diet Reviewed Heart Failure Action Plan in depth and provided written copy Assessed need for readable accurate scales in home Provided education about placing scale on hard, flat surface Advised patient to weigh each morning after emptying bladder Discussed importance of daily weight and advised patient to weigh and record daily Discussed the importance of keeping all appointments with provider Provided patient with education about the role of exercise in the management of heart failure Screening for signs and symptoms of depression related to chronic disease state  Assessed social determinant of health barriers Advanced directives packet mailed to facility Safety precautions reviewed  Symptom Management: Take medications as prescribed   Attend all scheduled provider appointments Attend church or other social activities Call provider office for new concerns or questions  Work with the social worker to address care coordination needs and will continue to work with the clinical team to address health care and disease management related needs call office if I gain more than 2 pounds in one day or 5 pounds in one week keep legs up while sitting track weight in diary use salt in moderation watch for swelling in feet, ankles and legs every day weigh myself daily develop a  rescue plan follow rescue plan if symptoms flare-up eat more whole grains, fruits and vegetables, lean meats and healthy fats dress right for the weather, hot or cold Look over education mailed- Heart Failure action plan Advanced directives mailed- please look over and complete fall  prevention strategies: change position slowly, use assistive device such as walker or cane (per provider recommendations) when walking, keep walkways clear, have good lighting in room. It is important to contact your provider if you have any falls, maintain muscle strength/tone by exercise per provider recommendations.  Follow Up Plan: Telephone follow up appointment with care management team member scheduled for:  02/06/23 at 130 pm          The patient verbalized understanding of instructions, educational materials, and care plan provided today and agreed to receive a mailed copy of patient instructions, educational materials, and care plan.   Telephone follow up appointment with care management team member scheduled for:   02/06/23 at 130 pm  Atrial Fibrillation Atrial fibrillation (AFib) is a type of heartbeat that is irregular or fast. If you have AFib, your heart beats without any order. This makes it hard for your heart to pump blood in a normal way. AFib may come and go, or it may become a long-lasting problem. If AFib is not treated, it can put you at higher risk for stroke, heart failure, and other heart problems. What are the causes? AFib may be caused by diseases that damage the heart's electrical system. They include: High blood pressure. Heart failure. Heart valve diseases. Heart surgery. Diabetes. Thyroid disease. Kidney disease. Lung diseases, such as pneumonia or COPD. Sleep apnea. Sometimes the cause is not known. What increases the risk? You are more likely to develop AFib if: You are older. You exercise often and very hard. You have a family history of AFib. You are male. You are Caucasian. You are overweight. You smoke. You drink a lot of alcohol. What are the signs or symptoms? Common symptoms of this condition include: A feeling that your heart is beating very fast. Chest pain or discomfort. Feeling short of breath. Suddenly feeling light-headed or  weak. Getting tired easily during activity. Fainting. Sweating. In some cases, there are no symptoms. How is this treated? Medicines to: Prevent blood clots. Treat heart rate or heart rhythm problems. Using devices, such as a pacemaker, to correct heart rhythm problems. Doing surgery to remove the part of the heart that sends bad signals. Closing an area where clots can form in the heart (left atrial appendage). In some cases, your doctor will treat other underlying conditions. Follow these instructions at home: Medicines Take over-the-counter and prescription medicines only as told by your doctor. Do not take any new medicines without first talking to your doctor. If you are taking blood thinners: Talk with your doctor before taking aspirin or NSAIDs, such as ibuprofen. Take your medicines as told. Take them at the same time each day. Do not do things that could hurt or bruise you. Be careful to avoid falls. Wear an alert bracelet or carry a card that says you take blood thinners. Lifestyle Do not smoke or use any products that contain nicotine or tobacco. If you need help quitting, ask your doctor. Eat heart-healthy foods. Talk with your doctor about the right eating plan for you. Exercise regularly as told by your doctor. Do not drink alcohol. Lose weight if you are overweight. General instructions If you have sleep apnea, treat it as told by your doctor.  Do not use diet pills unless your doctor says they are safe for you. Diet pills may make heart problems worse. Keep all follow-up visits. Your doctor will check your heart rate and rhythm regularly. Contact a doctor if: You notice a change in the speed, rhythm, or strength of your heartbeat. You are taking a blood-thinning medicine and you get more bruising. You get tired more easily when you move or exercise. You have a sudden change in weight. Get help right away if:  You have pain in your chest. You have trouble  breathing. You have side effects of blood thinners, such as blood in your vomit, poop (stool), or pee (urine), or bleeding that cannot stop. You have any signs of a stroke. "BE FAST" is an easy way to remember the main warning signs: B - Balance. Dizziness, sudden trouble walking, or loss of balance. E - Eyes. Trouble seeing or a change in how you see. F - Face. Sudden weakness or loss of feeling in the face. The face or eyelid may droop on one side. A - Arms.Weakness or loss of feeling in an arm. This happens suddenly and usually on one side of the body. S - Speech. Sudden trouble speaking, slurred speech, or trouble understanding what people say. T - Time.Time to call emergency services. Write down what time symptoms started. You have other signs of a stroke, such as: A sudden, very bad headache with no known cause. Feeling like you may vomit (nausea). Vomiting. A seizure. These symptoms may be an emergency. Get help right away. Call 911. Do not wait to see if the symptoms will go away. Do not drive yourself to the hospital. This information is not intended to replace advice given to you by your health care provider. Make sure you discuss any questions you have with your health care provider. Document Revised: 06/20/2022 Document Reviewed: 06/20/2022 Elsevier Patient Education  [place]. Heart Failure Action Plan A heart failure action plan helps you understand what to do when you have symptoms of heart failure. Your action plan is a color-coded plan that lists the symptoms to watch for and indicates what actions to take. If you have symptoms in the red zone, you need medical care right away. If you have symptoms in the yellow zone, you are having problems. If you have symptoms in the green zone, you are doing well. Follow the plan that was created by you and your health care provider. Review your plan each time you visit your health care provider. Red zone These signs and  symptoms mean you should get medical help right away: You have trouble breathing when resting. You have a dry cough that is getting worse. You have swelling or pain in your legs or abdomen that is getting worse. You suddenly gain more than 2-3 lb (0.9-1.4 kg) in 24 hours, or more than 5 lb (2.3 kg) in a week. This amount may be more or less depending on your condition. You have trouble staying awake or you feel confused. You have chest pain. You do not have an appetite. You pass out. You have worsening sadness or depression. If you have any of these symptoms, call your local emergency services (911 in the [place] right away. Do not drive yourself to the hospital. Yellow zone These signs and symptoms mean your condition may be getting worse and you should make some changes: You have trouble breathing when you are active, or you need to sleep with your head raised  on extra pillows to help you breathe. You have swelling in your legs or abdomen. You gain 2-3 lb (0.9-1.4 kg) in 24 hours, or 5 lb (2.3 kg) in a week. This amount may be more or less depending on your condition. You get tired easily. You have trouble sleeping. You have a dry cough. If you have any of these symptoms: Contact your health care provider within the next day. Your health care provider may adjust your medicines. Green zone These signs mean you are doing well and can continue what you are doing: You do not have shortness of breath. You have very little swelling or no new swelling. Your weight is stable (no gain or loss). You have a normal activity level. You do not have chest pain or any other new symptoms. Follow these instructions at home: Take over-the-counter and prescription medicines only as told by your health care provider. Weigh yourself daily. Your target weight is __________ lb (__________ kg). Call your health care provider if you gain more than __________ lb (__________ kg) in 24 hours, or more than  __________ lb (__________ kg) in a week. Health care provider name: _____________________________________________________ Health care provider phone number: _____________________________________________________ Eat a heart-healthy diet. Work with a diet and nutrition specialist (dietitian) to create an eating plan that is best for you. Keep all follow-up visits. This is important. Where to find more information American Heart Association: Summary A heart failure action plan helps you understand what to do when you have symptoms of heart failure. Follow the action plan that was created by you and your health care provider. Get help right away if you have any symptoms in the red zone. This information is not intended to replace advice given to you by your health care provider. Make sure you discuss any questions you have with your health care provider. Document Revised: 01/09/2022 Document Reviewed: 05/16/2020 Elsevier Patient Education  [place]. Low-Sodium Eating Plan Sodium, which is an element that makes up salt, helps you maintain a healthy balance of fluids in your body. Too much sodium can increase your blood pressure and cause fluid and waste to be held in your body. Your health care provider or dietitian may recommend following this plan if you have high blood pressure (hypertension), kidney disease, liver disease, or heart failure. Eating less sodium can help lower your blood pressure, reduce swelling, and protect your heart, liver, and kidneys. What are tips for following this plan? Reading food labels The Nutrition Facts label lists the amount of sodium in one serving of the food. If you eat more than one serving, you must multiply the listed amount of sodium by the number of servings. Choose foods with less than 140 mg of sodium per serving. Avoid foods with 300 mg of sodium or more per serving. Shopping  Look for lower-sodium products, often labeled as "low-sodium" or  "no salt added." Always check the sodium content, even if foods are labeled as "unsalted" or "no salt added." Buy fresh foods. Avoid canned foods and pre-made or frozen meals. Avoid canned, cured, or processed meats. Buy breads that have less than 80 mg of sodium per slice. Cooking  Eat more home-cooked food and less restaurant, buffet, and fast food. Avoid adding salt when cooking. Use salt-free seasonings or herbs instead of table salt or sea salt. Check with your health care provider or pharmacist before using salt substitutes. Cook with plant-based oils, such as canola, sunflower, or olive oil. Meal planning When eating at  a restaurant, ask that your food be prepared with less salt or no salt, if possible. Avoid dishes labeled as brined, pickled, cured, smoked, or made with soy sauce, miso, or teriyaki sauce. Avoid foods that contain MSG (monosodium glutamate). MSG is sometimes added to [place] food, bouillon, and some canned foods. Make meals that can be grilled, baked, poached, roasted, or steamed. These are generally made with less sodium. General information Most people on this plan should limit their sodium intake to 1,500-2,000 mg (milligrams) of sodium each day. What foods should I eat? Fruits Fresh, frozen, or canned fruit. Fruit juice. Vegetables Fresh or frozen vegetables. "No salt added" canned vegetables. "No salt added" tomato sauce and paste. Low-sodium or reduced-sodium tomato and vegetable juice. Grains Low-sodium cereals, including oats, puffed wheat and rice, and shredded wheat. Low-sodium crackers. Unsalted rice. Unsalted pasta. Low-sodium bread. Whole-grain breads and whole-grain pasta. Meats and other proteins Fresh or frozen (no salt added) meat, poultry, seafood, and fish. Low-sodium canned tuna and salmon. Unsalted nuts. Dried peas, beans, and lentils without added salt. Unsalted canned beans. Eggs. Unsalted nut butters. Dairy Milk. Soy milk. Cheese that is  naturally low in sodium, such as ricotta cheese, fresh mozzarella, or Swiss cheese. Low-sodium or reduced-sodium cheese. Cream cheese. Yogurt. Seasonings and condiments Fresh and dried herbs and spices. Salt-free seasonings. Low-sodium mustard and ketchup. Sodium-free salad dressing. Sodium-free light mayonnaise. Fresh or refrigerated horseradish. Lemon juice. Vinegar. Other foods Homemade, reduced-sodium, or low-sodium soups. Unsalted popcorn and pretzels. Low-salt or salt-free chips. The items listed above may not be a complete list of foods and beverages you can eat. Contact a dietitian for more information. What foods should I avoid? Vegetables Sauerkraut, pickled vegetables, and relishes. Olives. [place] fries. Onion rings. Regular canned vegetables (not low-sodium or reduced-sodium). Regular canned tomato sauce and paste (not low-sodium or reduced-sodium). Regular tomato and vegetable juice (not low-sodium or reduced-sodium). Frozen vegetables in sauces. Grains Instant hot cereals. Bread stuffing, pancake, and biscuit mixes. Croutons. Seasoned rice or pasta mixes. Noodle soup cups. Boxed or frozen macaroni and cheese. Regular salted crackers. Self-rising flour. Meats and other proteins Meat or fish that is salted, canned, smoked, spiced, or pickled. Precooked or cured meat, such as sausages or meat loaves. [place] Salines. Ham. Pepperoni. Hot dogs. Corned beef. Chipped beef. Salt pork. Jerky. Pickled herring. Anchovies and sardines. Regular canned tuna. Salted nuts. Dairy Processed cheese and cheese spreads. Hard cheeses. Cheese curds. Blue cheese. Feta cheese. String cheese. Regular cottage cheese. Buttermilk. Canned milk. Fats and oils Salted butter. Regular margarine. Ghee. Bacon fat. Seasonings and condiments Onion salt, garlic salt, seasoned salt, table salt, and sea salt. Canned and packaged gravies. Worcestershire sauce. Tartar sauce. Barbecue sauce. Teriyaki sauce. Soy sauce, including  reduced-sodium. Steak sauce. Fish sauce. Oyster sauce. Cocktail sauce. Horseradish that you find on the shelf. Regular ketchup and mustard. Meat flavorings and tenderizers. Bouillon cubes. Hot sauce. Pre-made or packaged marinades. Pre-made or packaged taco seasonings. Relishes. Regular salad dressings. Salsa. Other foods Salted popcorn and pretzels. Corn chips and puffs. Potato and tortilla chips. Canned or dried soups. Pizza. Frozen entrees and pot pies. The items listed above may not be a complete list of foods and beverages you should avoid. Contact a dietitian for more information. Summary Eating less sodium can help lower your blood pressure, reduce swelling, and protect your heart, liver, and kidneys. Most people on this plan should limit their sodium intake to 1,500-2,000 mg (milligrams) of sodium each day. Canned, boxed, and frozen foods are  high in sodium. Restaurant foods, fast foods, and pizza are also very high in sodium. You also get sodium by adding salt to food. Try to cook at home, eat more fresh fruits and vegetables, and eat less fast food and canned, processed, or prepared foods. This information is not intended to replace advice given to you by your health care provider. Make sure you discuss any questions you have with your health care provider. Document Revised: 09/07/2019 Document Reviewed: 09/02/2019 Elsevier Patient Education  [place].

## 2022-12-11 NOTE — Plan of Care (Signed)
Chronic Care Management Provider Comprehensive Care Plan    12/11/2022 Name: Nicholas [name] MRN: [id number] DOB: 1951/03/12  Referral to Chronic Care Management (CCM) services was placed by Provider:  [name] MD on Date: 10/08/22.  Chronic Condition 1: CONGESTIVE HEART FAILURE Provider Assessment and Plan             Chronic diastolic CHF (congestive heart failure) (HCC)      Seems euvolemic today.           Expected Outcome/Goals Addressed This Visit (Provider CCM goals/Provider Assessment and plan  CCM (CONGESTIVE HEART FAILURE) EXPECTED OUTCOME: MONITOR, SELF-MANAGE AND REDUCE SYMPTOMS OF CONGESTIVE HEART FAILURE  Symptom Management Condition 1: Take medications as prescribed   Attend all scheduled provider appointments Attend church or other social activities Call provider office for new concerns or questions  Work with the social worker to address care coordination needs and will continue to work with the clinical team to address health care and disease management related needs call office if I gain more than 2 pounds in one day or 5 pounds in one week keep legs up while sitting track weight in diary use salt in moderation watch for swelling in feet, ankles and legs every day weigh myself daily develop a rescue plan follow rescue plan if symptoms flare-up eat more whole grains, fruits and vegetables, lean meats and healthy fats dress right for the weather, hot or cold Look over education mailed- Heart Failure action plan Advanced directives mailed- please look over and complete fall prevention strategies: change position slowly, use assistive device such as walker or cane (per provider recommendations) when walking, keep walkways clear, have good lighting in room. It is important to contact your provider if you have any falls, maintain muscle strength/tone by exercise per provider recommendations.  Chronic Condition 2: ATRIAL FIBRILLATION Provider Assessment  and Plan  Atrial fibrillation with RVR ([place])       He continues eliquis after episode of afib while acutely ill (hospitalization 08/2022)       Expected Outcome/Goals Addressed This Visit (Provider CCM goals/Provider Assessment and plan  CCM (ATRIAL FIBRILLATION) EXPECTED OUTCOME: MONITOR, SELF-MANAGE AND REDUCE SYMPTOMS OF ATRIAL FIBRILLATION  Symptom Management Condition 2: Take medications as prescribed   Attend all scheduled provider appointments Attend church or other social activities Work with the social worker to address care coordination needs and will continue to work with the clinical team to address health care and disease management related needs - check pulse (heart) rate before taking medicine - check pulse (heart) rate once a day - make a plan to exercise regularly - make a plan to eat healthy - keep all lab appointments - take medicine as prescribed - wear medical alert identification  Chronic Condition 3: CHRONIC KIDNEY DISEASE Provider Assessment and Plan CKD (chronic kidney disease) stage 3, GFR 30-59 ml/min (HCC)       Latest GFR >60        Expected Outcome/Goals Addressed This Visit (Provider CCM goals/Provider Assessment and plan  CCM (CHRONIC KIDNEY DISEASE) EXPECTED OUTCOME: MONITOR, SELF-MANAGE AND REDUCE SYMPTOMS OF CHRONIC KIDNEY DISEASE  Symptom Management Condition 3: Take medications as prescribed   Attend all scheduled provider appointments Attend church or other social activities Work with the social worker to address care coordination needs and will continue to work with the clinical team to address health care and disease management related needs Follow a healthy diet Choose water to drink Follow low sodium diet  Problem List  Patient Active Problem List   Diagnosis Date Noted   Acute meningitis 11/06/2022   Chronic anticoagulation 11/06/2022   Frailty 11/06/2022   Abnormal urinalysis, possible UTI 11/06/2022   FTT (failure to thrive)  in adult 11/01/2022   UTI (urinary tract infection) 09/27/2022   Petechial rash 09/27/2022   Sepsis ([place]) 08/23/2022   MSSA bacteremia 08/16/2022 08/18/2022   Seizure disorder ([place]) 08/17/2022   Atrial fibrillation ([place]) 08/17/2022   Acute urinary retention 0000000   Acute metabolic encephalopathy [id number]   PND (post-nasal drip) 07/25/2022   Oral thrush 07/15/2022   Open wound of foot with complication, left, initial encounter 06/16/2022   Hx of excision of lamina of lumbar vertebra for decompression of spinal cord 03/20/2022   Peripheral edema 02/28/2022   Recurrent major depressive disorder ([place]) 02/28/2022   Recurrent falls 08/25/2021   Memory difficulty 07/04/2021   Chronic toe ulcer, left, with fat layer exposed ([place]) 01/23/2021   Pituitary adenoma ([place]) 09/06/2020   Chronic headache 07/27/2020   Vitamin B12 deficiency 03/31/2020   Vitamin D deficiency 03/31/2020   Numbness of foot 03/25/2020   Status post amputation of great toe, left ([place]) 12/14/2019   Callus of foot 04/02/2019   Allergy to bee sting 03/11/2019   Insomnia 02/10/2019   Status post lumbar spine operative procedure for decompression of spinal cord 12/12/2018   Dyslipidemia 10/19/2018   Foot deformity 10/19/2018   Status post reverse total replacement of right shoulder 05/13/2018   Chronic diastolic CHF (congestive heart failure) ([place]) 12/27/2017   OSA (obstructive sleep apnea) 12/27/2017   CKD (chronic kidney disease) stage 3, GFR 30-59 ml/min (HCC) 10/01/2017   Fatty liver 10/01/2017   Status post reverse total replacement of left shoulder 09/03/2017   Bilateral shoulder pain 05/24/2017   History of CVA in adulthood 03/08/2017   S/P total knee arthroplasty, left 01/30/2017   Osteoarthritis of knees, bilateral 01/15/2017   Obesity, Class I, BMI 30-34.9 07/19/2016   Hypothyroidism    Iron deficiency 04/23/2016   Chronic pain syndrome 04/23/2016   Inflammatory arthritis 04/23/2016   Chronic leg  pain 04/23/2016   Lesion of palate 04/23/2016   GERD (gastroesophageal reflux disease)    Duodenitis determined by biopsy 02/13/2016   Diverticulosis of colon without hemorrhage 01/31/2016   Migraine 01/11/2012   History of methicillin resistant staphylococcus aureus (MRSA) 07/04/2010   DDD (degenerative disc disease), lumbar 07/04/2010   History of total right knee replacement 07/04/2010   Contact dermatitis 07/04/2010   Lesion of ulnar nerve 07/04/2010   Closed fracture of malar bone ([place]) 07/04/2010   Narrowing of intervertebral disc space 07/04/2010   Olecranon bursitis 07/04/2010   History of artificial joint 07/04/2010   Primary osteoarthritis, right shoulder 06/09/2007   Osteoarthritis 06/09/2007   Personal history of colonic adenoma 06/01/2003    Medication Management  Current Outpatient Medications:    acetaminophen (TYLENOL) 500 MG tablet, Take 1,000 mg by mouth 3 (three) times daily., Disp: , Rfl:    amitriptyline (ELAVIL) 50 MG tablet, Take 1 tablet (50 mg total) by mouth at bedtime., Disp: 90 tablet, Rfl: 1   apixaban (ELIQUIS) 5 MG TABS tablet, Take 1 tablet (5 mg total) by mouth 2 (two) times daily., Disp: 60 tablet, Rfl: 3   atorvastatin (LIPITOR) 20 MG tablet, TAKE ONE TABLET BY MOUTH ONCE DAILY, Disp: 90 tablet, Rfl: 3   B Complex Vitamins (B-COMPLEX/B-12 PO), Take 1 tablet by mouth daily., Disp: , Rfl:    cetirizine (ZYRTEC) 10 MG  tablet, Take 1 tablet (10 mg total) by mouth daily., Disp: 30 tablet, Rfl: 0   Cholecalciferol (VITAMIN D3) 25 MCG (1000 UT) CAPS, Take 1 capsule (1,000 Units total) by mouth daily., Disp: 90 capsule, Rfl: 1   divalproex (DEPAKOTE ER) 500 MG 24 hr tablet, TAKE ONE TABLET BY MOUTH EVERYDAY AT BEDTIME, Disp: 30 tablet, Rfl: 5   EPINEPHrine 0.3 mg/0.3 mL IJ SOAJ injection, Inject 0.3 mg into the muscle as needed for anaphylaxis. (bee stings), Disp: 2 each, Rfl: 1   ferrous sulfate 325 (65 FE) MG EC tablet, Take 325 mg by mouth daily with  breakfast., Disp: , Rfl:    fluticasone (FLONASE) 50 MCG/ACT nasal spray, Place 2 sprays into both nostrils daily., Disp: 16 g, Rfl: 0   furosemide (LASIX) 20 MG tablet, TAKE ONE TABLET BY MOUTH EVERY MORNING, Disp: 90 tablet, Rfl: 1   gabapentin (NEURONTIN) 300 MG capsule, TAKE ONE CAPSULE BY MOUTH EVERY MORNING and TAKE TWO CAPSULES BY MOUTH AT NOON and TAKE TWO CAPSULES BY MOUTH EVERY EVENING, Disp: 450 capsule, Rfl: 1   levothyroxine (SYNTHROID) 112 MCG tablet, Take 1 tablet (112 mcg total) by mouth daily before breakfast., Disp: 90 tablet, Rfl: 3   metoprolol succinate (TOPROL-XL) 100 MG 24 hr tablet, Take 1 tablet (100 mg total) by mouth at bedtime. Take with or immediately following a meal., Disp: , Rfl:    nitroGLYCERIN (NITROSTAT) 0.4 MG SL tablet, Place 1 tablet (0.4 mg total) under the tongue every 5 (five) minutes as needed for chest pain., Disp: 20 tablet, Rfl: 3   ondansetron (ZOFRAN-ODT) 4 MG disintegrating tablet, TAKE ONE TABLET BY MOUTH every EIGHT hours AS NEEDED, Disp: 20 tablet, Rfl: 3   pantoprazole (PROTONIX) 40 MG tablet, TAKE ONE TABLET BY MOUTH ONCE DAILY, Disp: 90 tablet, Rfl: 0   tamsulosin (FLOMAX) 0.4 MG CAPS capsule, TAKE ONE CAPSULE BY MOUTH ONCE DAILY, Disp: 30 capsule, Rfl: 2   tiZANidine (ZANAFLEX) 4 MG tablet, TAKE ONE TABLET BY MOUTH every SIX hours AS NEEDED FOR acute HEADACHE, Disp: 20 tablet, Rfl: 1   Ubrogepant (UBRELVY) 100 MG TABS, Take 100 mg by mouth as needed (take 1 tablet at onset of headache, may repeat in hours, max is 200 mg in 24 hours)., Disp: 12 tablet, Rfl: 11  Cognitive Assessment Identity Confirmed: : Name; DOB Cognitive Status: Normal   Functional Assessment Hearing Difficulty or Deaf: no Wear Glasses or Blind: yes Vision Management: can see well w/ glasses Concentrating, Remembering or Making Decisions Difficulty (CP): no Difficulty Communicating: no Difficulty Eating/Swallowing: no Walking or Climbing Stairs Difficulty: yes Walking  or Climbing Stairs: ambulation difficulty, assistance 1 person Mobility Management: pt is at ALF, has [employer] WC, staff assists with bathing, meals, medication administration, etc. Dressing/Bathing Difficulty: yes Dressing/Bathing: bathing difficulty, assistance 1 person Dressing/Bathing Management: pt is able to dress himself independently, requires assistance with bathing Doing Errands Independently Difficulty (such as shopping) (CP): yes Errands Management: facility, and son assists   Caregiver Assessment  Primary Source of Support/Comfort: child(ren) Name of Support/Comfort Primary Source: pt is in ALF and his son [name] assists as needed People in Home: facility resident   Planned Interventions  Assessed the patient     understanding of chronic kidney disease    Evaluation of current treatment plan related to chronic kidney disease self management and patient's adherence to plan as established by provider      Reviewed prescribed diet low sodium Reviewed medications with patient and discussed importance  of compliance    Counseled on the importance of exercise goals with target of 150 minutes per week     Advised patient, providing education and rationale, to monitor blood pressure daily and record, calling PCP for findings outside established parameters    Discussed complications of poorly controlled blood pressure such as heart disease, stroke, circulatory complications, vision complications, kidney impairment, sexual dysfunction    Discussed plans with patient for ongoing care management follow up and provided patient with direct contact information for care management team    Screening for signs and symptoms of depression related to chronic disease state      Basic overview and discussion of pathophysiology of Heart Failure reviewed Provided education on low sodium diet Reviewed Heart Failure Action Plan in depth and provided written copy Assessed need for readable  accurate scales in home Provided education about placing scale on hard, flat surface Advised patient to weigh each morning after emptying bladder Discussed importance of daily weight and advised patient to weigh and record daily Discussed the importance of keeping all appointments with provider Provided patient with education about the role of exercise in the management of heart failure Screening for signs and symptoms of depression related to chronic disease state  Assessed social determinant of health barriers Advanced directives packet mailed to facility Safety precautions reviewed Reviewed importance of adherence to anticoagulant exactly as prescribed Advised patient to discuss any change in health status, exacerbation of Atrial Fibrillation with provider Counseled on avoidance of NSAIDs due to increased bleeding risk with anticoagulants Counseled on importance of regular laboratory monitoring as prescribed Afib action plan reviewed Screening for signs and symptoms of depression related to chronic disease state Assessed social determinant of health barriers Interaction and coordination with outside resources, practitioners, and providers See CCM Referral  Care Plan: Printed and mailed to patient

## 2022-12-13 ENCOUNTER — Emergency Department (HOSPITAL_COMMUNITY): Payer: 59

## 2022-12-13 ENCOUNTER — Encounter (HOSPITAL_COMMUNITY): Payer: Self-pay

## 2022-12-13 ENCOUNTER — Emergency Department (HOSPITAL_COMMUNITY)
Admission: EM | Admit: 2022-12-13 | Discharge: 2022-12-13 | Disposition: A | Discharge status: Home / Self Care | Payer: 59 | Attending: Emergency Medicine | Admitting: Emergency Medicine

## 2022-12-13 DIAGNOSIS — Z91040 Latex allergy status: Secondary | ICD-10-CM | POA: Insufficient documentation

## 2022-12-13 DIAGNOSIS — R531 Weakness: Secondary | ICD-10-CM | POA: Diagnosis not present

## 2022-12-13 DIAGNOSIS — Z8673 Personal history of transient ischemic attack (TIA), and cerebral infarction without residual deficits: Secondary | ICD-10-CM | POA: Diagnosis not present

## 2022-12-13 DIAGNOSIS — S0003XA Contusion of scalp, initial encounter: Secondary | ICD-10-CM | POA: Diagnosis not present

## 2022-12-13 DIAGNOSIS — Z743 Need for continuous supervision: Secondary | ICD-10-CM | POA: Diagnosis not present

## 2022-12-13 DIAGNOSIS — N189 Chronic kidney disease, unspecified: Secondary | ICD-10-CM | POA: Diagnosis not present

## 2022-12-13 DIAGNOSIS — S0101XA Laceration without foreign body of scalp, initial encounter: Secondary | ICD-10-CM | POA: Diagnosis not present

## 2022-12-13 DIAGNOSIS — W0110XA Fall on same level from slipping, tripping and stumbling with subsequent striking against unspecified object, initial encounter: Secondary | ICD-10-CM | POA: Insufficient documentation

## 2022-12-13 DIAGNOSIS — W19XXXA Unspecified fall, initial encounter: Secondary | ICD-10-CM

## 2022-12-13 DIAGNOSIS — E039 Hypothyroidism, unspecified: Secondary | ICD-10-CM | POA: Insufficient documentation

## 2022-12-13 DIAGNOSIS — N1831 Chronic kidney disease, stage 3a: Secondary | ICD-10-CM | POA: Diagnosis not present

## 2022-12-13 DIAGNOSIS — Z7401 Bed confinement status: Secondary | ICD-10-CM | POA: Diagnosis not present

## 2022-12-13 DIAGNOSIS — I5030 Unspecified diastolic (congestive) heart failure: Secondary | ICD-10-CM

## 2022-12-13 DIAGNOSIS — Z91010 Allergy to peanuts: Secondary | ICD-10-CM | POA: Diagnosis not present

## 2022-12-13 DIAGNOSIS — I4891 Unspecified atrial fibrillation: Secondary | ICD-10-CM

## 2022-12-13 DIAGNOSIS — Z7901 Long term (current) use of anticoagulants: Secondary | ICD-10-CM | POA: Diagnosis not present

## 2022-12-13 DIAGNOSIS — I509 Heart failure, unspecified: Secondary | ICD-10-CM | POA: Insufficient documentation

## 2022-12-13 DIAGNOSIS — R519 Headache, unspecified: Secondary | ICD-10-CM | POA: Diagnosis present

## 2022-12-13 DIAGNOSIS — S0990XA Unspecified injury of head, initial encounter: Secondary | ICD-10-CM

## 2022-12-13 MED ORDER — LIDOCAINE-EPINEPHRINE-TETRACAINE (LET) TOPICAL GEL
3.0000 mL | Freq: Once | TOPICAL | Status: AC
  Administered 2022-12-13: 3 mL via TOPICAL
  Filled 2022-12-13: qty 3

## 2022-12-13 NOTE — ED Notes (Signed)
Bacitracin and dressing applied to sutured lac site per Dr. Sherry Ruffing

## 2022-12-13 NOTE — Discharge Instructions (Signed)
Your history, exam, and evaluation today are consistent with a laceration to the back of your head after falling today.  The CT scan did not show significant traumatic injuries and we repaired the laceration with staples.  Please follow-up with your primary doctor or physician at the facility to help remove the staples in approximately 7 to 10 days.  If any symptoms change or worsen acutely, please return to the nearest emergency department.

## 2022-12-13 NOTE — ED Notes (Signed)
Patient transported to CT 

## 2022-12-13 NOTE — ED Notes (Signed)
Report given to Hughes Better, nurse at [facility]

## 2022-12-13 NOTE — ED Notes (Signed)
Pt returned from CT °

## 2022-12-13 NOTE — ED Notes (Signed)
No answer at [facility], x2 attempts

## 2022-12-13 NOTE — ED Provider Notes (Signed)
[facility] Provider Note   CSN: [id number] Arrival date & time: 12/13/22  1401     History  Chief Complaint  Patient presents with   Nicholas [name] is a 72 y.o. male.  The history is provided by the patient, the EMS personnel and medical records. No language interpreter was used.  Fall This is a new problem. The current episode started 1 to 2 hours ago. The problem occurs rarely. The problem has not changed since onset.Associated symptoms include headaches. Pertinent negatives include no chest pain, no abdominal pain and no shortness of breath. Nothing aggravates the symptoms. Nothing relieves the symptoms. He has tried nothing for the symptoms. The treatment provided no relief.       Home Medications Prior to Admission medications   Medication Sig Start Date End Date Taking? Authorizing Provider  acetaminophen (TYLENOL) 500 MG tablet Take 1,000 mg by mouth 3 (three) times daily.    [provider]  amitriptyline (ELAVIL) 50 MG tablet Take 1 tablet (50 mg total) by mouth at bedtime. 10/30/22   [name], MD  apixaban (ELIQUIS) 5 MG TABS tablet Take 1 tablet (5 mg total) by mouth 2 (two) times daily. 09/27/22   [name], MD  atorvastatin (LIPITOR) 20 MG tablet TAKE ONE TABLET BY MOUTH ONCE DAILY 02/21/22   [name], MD  B Complex Vitamins (B-COMPLEX/B-12 PO) Take 1 tablet by mouth daily.    [provider]  cetirizine (ZYRTEC) 10 MG tablet Take 1 tablet (10 mg total) by mouth daily. 07/25/22   Michela Pitcher, NP  Cholecalciferol (VITAMIN D3) 25 MCG (1000 UT) CAPS Take 1 capsule (1,000 Units total) by mouth daily. 03/24/21   [name], MD  divalproex (DEPAKOTE ER) 500 MG 24 hr tablet TAKE ONE TABLET BY MOUTH EVERYDAY AT BEDTIME 06/20/22   Suzzanne Cloud, NP  EPINEPHrine 0.3 mg/0.3 mL IJ SOAJ injection Inject 0.3 mg into the muscle as needed for anaphylaxis. (bee stings) 12/18/21    [name], MD  ferrous sulfate 325 (65 FE) MG EC tablet Take 325 mg by mouth daily with breakfast.    [provider]  fluticasone (FLONASE) 50 MCG/ACT nasal spray Place 2 sprays into both nostrils daily. 07/25/22   Michela Pitcher, NP  furosemide (LASIX) 20 MG tablet TAKE ONE TABLET BY MOUTH EVERY MORNING 05/23/22   [name], MD  gabapentin (NEURONTIN) 300 MG capsule TAKE ONE CAPSULE BY MOUTH EVERY MORNING and TAKE TWO CAPSULES BY MOUTH AT NOON and TAKE TWO CAPSULES BY MOUTH EVERY EVENING 08/16/22   [name], MD  levothyroxine (SYNTHROID) 112 MCG tablet Take 1 tablet (112 mcg total) by mouth daily before breakfast. 02/24/22   [name], MD  metoprolol succinate (TOPROL-XL) 100 MG 24 hr tablet Take 1 tablet (100 mg total) by mouth at bedtime. Take with or immediately following a meal. 08/28/22   Jennye Boroughs, MD  nitroGLYCERIN (NITROSTAT) 0.4 MG SL tablet Place 1 tablet (0.4 mg total) under the tongue every 5 (five) minutes as needed for chest pain. 11/27/21   [name], MD  ondansetron (ZOFRAN-ODT) 4 MG disintegrating tablet TAKE ONE TABLET BY MOUTH every EIGHT hours AS NEEDED 10/04/22   Suzzanne Cloud, NP  pantoprazole (PROTONIX) 40 MG tablet TAKE ONE TABLET BY MOUTH ONCE DAILY 11/20/22   [name], MD  tamsulosin (FLOMAX) 0.4 MG CAPS capsule TAKE ONE CAPSULE BY MOUTH ONCE DAILY 11/20/22   Danise Mina,  Garlon Hatchet, MD  tiZANidine (ZANAFLEX) 4 MG tablet TAKE ONE TABLET BY MOUTH every SIX hours AS NEEDED FOR acute HEADACHE 10/04/22   Suzzanne Cloud, NP  Ubrogepant (UBRELVY) 100 MG TABS Take 100 mg by mouth as needed (take 1 tablet at onset of headache, may repeat in hours, max is 200 mg in 24 hours). 07/24/22   Suzzanne Cloud, NP      Allergies    Peanut-containing drug products, Yellow jacket venom [bee venom], Celebrex [celecoxib], Percocet [oxycodone-acetaminophen], Latex, and Nsaids    Review of Systems   Review of Systems  Constitutional:  Negative  for chills, fatigue and fever.  HENT:  Negative for congestion.   Eyes:  Negative for visual disturbance.  Respiratory:  Negative for cough, chest tightness, shortness of breath and wheezing.   Cardiovascular:  Negative for chest pain and palpitations.  Gastrointestinal:  Negative for abdominal pain, constipation, diarrhea, nausea and vomiting.  Genitourinary:  Negative for dysuria and flank pain.  Musculoskeletal:  Negative for back pain, neck pain and neck stiffness.  Skin:  Positive for wound. Negative for rash.  Neurological:  Positive for headaches. Negative for dizziness, syncope, facial asymmetry, speech difficulty, weakness, light-headedness and numbness.  Psychiatric/Behavioral:  Negative for agitation and confusion.   All other systems reviewed and are negative.   Physical Exam Updated Vital Signs There were no vitals taken for this visit. Physical Exam Vitals and nursing note reviewed.  Constitutional:      General: He is not in acute distress.    Appearance: He is well-developed. He is not ill-appearing, toxic-appearing or diaphoretic.  HENT:     Head: Laceration present.      Nose: No congestion or rhinorrhea.     Mouth/Throat:     Mouth: Mucous membranes are moist.  Eyes:     Extraocular Movements: Extraocular movements intact.     Conjunctiva/sclera: Conjunctivae normal.     Pupils: Pupils are equal, round, and reactive to light.  Cardiovascular:     Rate and Rhythm: Normal rate and regular rhythm.     Heart sounds: No murmur heard. Pulmonary:     Effort: Pulmonary effort is normal. No respiratory distress.     Breath sounds: Normal breath sounds. No wheezing, rhonchi or rales.  Chest:     Chest wall: No tenderness.  Abdominal:     General: Abdomen is flat.     Palpations: Abdomen is soft.     Tenderness: There is no abdominal tenderness. There is no guarding or rebound.  Musculoskeletal:        General: Tenderness present. No swelling.     Cervical back:  Neck supple. No tenderness.  Skin:    General: Skin is warm and dry.     Capillary Refill: Capillary refill takes less than 2 seconds.     Findings: No erythema or rash.  Neurological:     General: No focal deficit present.     Mental Status: He is alert.     Sensory: No sensory deficit.     Motor: No weakness.  Psychiatric:        Mood and Affect: Mood normal.     ED Results / Procedures / Treatments   Labs (all labs ordered are listed, but only abnormal results are displayed) Labs Reviewed - No data to display  EKG None  Radiology CT HEAD WO CONTRAST (5MM)  Result Date: 12/13/2022 CLINICAL DATA:  Trauma EXAM: CT HEAD WITHOUT CONTRAST TECHNIQUE: Contiguous axial images were obtained  from the base of the skull through the vertex without intravenous contrast. RADIATION DOSE REDUCTION: This exam was performed according to the departmental dose-optimization program which includes automated exposure control, adjustment of the mA and/or kV according to patient size and/or use of iterative reconstruction technique. COMPARISON:  CT Head 11/05/22 FINDINGS: Brain: No evidence of acute infarction, hemorrhage, hydrocephalus, extra-axial collection or mass lesion/mass effect. Sequela of mild chronic microvascular ischemic change. Vascular: No hyperdense vessel or unexpected calcification. Skull: Soft tissue hematoma and laceration along the right parietal scalp. Evidence of an underlying calvarial fracture. Sinuses/Orbits: No mastoid or middle ear effusion. Paranasal sinuses are clear. Bilateral lens replacement. Orbits are otherwise unremarkable. Prior fixation around the right orbit. Other: None IMPRESSION: 1. No CT evidence of intracranial injury. 2. Soft tissue hematoma and laceration along the right parietal scalp. No underlying calvarial fracture. Electronically Signed   By: Marin Roberts M.D.   On: 12/13/2022 15:02    Procedures .Marland KitchenLaceration Repair  Date/Time: 12/13/2022 4:32 PM  Performed  by: Courtney Paris, MD Authorized by: Courtney Paris, MD   Consent:    Consent obtained:  Emergent situation   Consent given by:  Patient   Risks, benefits, and alternatives were discussed: yes     Risks discussed:  Pain, poor cosmetic result and poor wound healing   Alternatives discussed:  No treatment Universal protocol:    Immediately prior to procedure, a time out was called: yes     Patient identity confirmed:  Verbally with patient Anesthesia:    Anesthesia method:  Topical application   Topical anesthetic:  LET Laceration details:    Location:  Scalp   Scalp location:  Occipital   Length (cm):  3   Depth (mm):  2 Pre-procedure details:    Preparation:  Imaging obtained to evaluate for foreign bodies Exploration:    Limited defect created (wound extended): no     Imaging outcome: foreign body not noted     Wound exploration: wound explored through full range of motion and entire depth of wound visualized     Contaminated: no   Treatment:    Area cleansed with:  Chlorhexidine and saline   Amount of cleaning:  Standard   Irrigation solution:  Sterile saline   Visualized foreign bodies/material removed: no     Debridement:  None   Undermining:  None   Scar revision: no   Skin repair:    Repair method:  Staples   Number of staples:  7 Approximation:    Approximation:  Close Repair type:    Repair type:  Simple Post-procedure details:    Dressing:  Antibiotic ointment and non-adherent dressing   Procedure completion:  Tolerated       Medications Ordered in ED Medications  lidocaine-EPINEPHrine-tetracaine (LET) topical gel (3 mLs Topical Given 12/13/22 1501)    ED Course/ Medical Decision Making/ A&P                             Medical Decision Making Amount and/or Complexity of Data Reviewed Radiology: ordered.    Nicholas [name] is a 72 y.o. male with a past medical history significant for GERD, migraines, obesity, hypothyroidism,  previous stroke, CKD, CHF, and atrial fibrillation on Eliquis therapy who presents with head injury as a level 2 trauma.  Patient says that he was completely at his baseline with no symptoms when he was trying to transfer by himself and he fell  hitting the back of his head.  He denies any neck pain or neck stiffness but reports a mild headache.  He had some bleeding in the back of the head and was brought in for evaluation of head injury on blood thinners.  He otherwise is feeling well and denies any other complaints.  Specifically denies vision changes, speech difficulties, nausea, vomiting, or neurologic complaints.  Denies chest pain, shortness of breath or palpitations.  Denies any urinary or GI symptoms.  We agreed together to hold on more extensive workup but we will get a head CT to rule out fracture or significant traumatic injuries.  Physical exam overall reassuring with no focal deficits.  He does have a laceration to the back of his occiput.  Let was applied after cleaning by nursing.  CT head showed no fracture and no intracranial bleed.  Laceration was repaired with staples to be taken out by his PCP.  Patient tolerated well without significant bleeding.  Bacitracin and bandaging was applied and he will follow-up with his PCP for this.  Patient agrees to hold on further workup given lack of other symptoms or preceding symptoms and patient will be discharged for outpatient follow-up after reassuring imaging of his head.         Final Clinical Impression(s) / ED Diagnoses Final diagnoses:  Injury of head, initial encounter  Fall, initial encounter  Laceration of occipital region of scalp, initial encounter    Rx / DC Orders ED Discharge Orders     None      Clinical Impression: 1. Injury of head, initial encounter   2. Fall, initial encounter   3. Laceration of occipital region of scalp, initial encounter     Disposition: Discharge  Condition: Good  I have  discussed the results, Dx and Tx plan with the pt(& family if present). He/she/they expressed understanding and agree(s) with the plan. Discharge instructions discussed at great length. Strict return precautions discussed and pt &/or family have verbalized understanding of the instructions. No further questions at time of discharge.    New Prescriptions   No medications on file    Follow Up: [name], MD [place] [address] [facility] [address] [id number] [place]       Antoinne Spadaccini, Gwenyth Allegra, MD 12/13/22 [phone]

## 2022-12-13 NOTE — ED Triage Notes (Signed)
Pt bib ems from [facility]; mechanical fall unwitnessed; pt states he fell while transferring, fell back; 1-2 in lac to back of head; no loc, a and o x 4, pt on thiniers; bleeding controlled; vss; denies neck or back pain, endorses some head pain; pt on Eliquis for Afib; 110/68, HR 81, RR 18, 93% RA, CBG 172

## 2022-12-21 DIAGNOSIS — L97523 Non-pressure chronic ulcer of other part of left foot with necrosis of muscle: Secondary | ICD-10-CM | POA: Diagnosis not present

## 2022-12-21 DIAGNOSIS — R262 Difficulty in walking, not elsewhere classified: Secondary | ICD-10-CM | POA: Diagnosis not present

## 2022-12-21 DIAGNOSIS — M19011 Primary osteoarthritis, right shoulder: Secondary | ICD-10-CM | POA: Diagnosis not present

## 2022-12-21 DIAGNOSIS — M6281 Muscle weakness (generalized): Secondary | ICD-10-CM | POA: Diagnosis not present

## 2022-12-21 DIAGNOSIS — I5023 Acute on chronic systolic (congestive) heart failure: Secondary | ICD-10-CM | POA: Diagnosis not present

## 2022-12-26 ENCOUNTER — Encounter: Payer: Self-pay | Admitting: Internal Medicine

## 2023-01-07 ENCOUNTER — Encounter: Payer: Self-pay | Admitting: Internal Medicine

## 2023-01-14 DIAGNOSIS — R519 Headache, unspecified: Secondary | ICD-10-CM | POA: Diagnosis not present

## 2023-01-14 DIAGNOSIS — R262 Difficulty in walking, not elsewhere classified: Secondary | ICD-10-CM | POA: Diagnosis not present

## 2023-01-14 DIAGNOSIS — I4891 Unspecified atrial fibrillation: Secondary | ICD-10-CM | POA: Diagnosis not present

## 2023-01-14 DIAGNOSIS — E039 Hypothyroidism, unspecified: Secondary | ICD-10-CM | POA: Diagnosis not present

## 2023-01-14 DIAGNOSIS — R54 Age-related physical debility: Secondary | ICD-10-CM | POA: Diagnosis not present

## 2023-01-14 DIAGNOSIS — Z87440 Personal history of urinary (tract) infections: Secondary | ICD-10-CM | POA: Diagnosis not present

## 2023-01-14 DIAGNOSIS — I5032 Chronic diastolic (congestive) heart failure: Secondary | ICD-10-CM | POA: Diagnosis not present

## 2023-01-14 DIAGNOSIS — Z89429 Acquired absence of other toe(s), unspecified side: Secondary | ICD-10-CM | POA: Diagnosis not present

## 2023-01-14 DIAGNOSIS — Z7901 Long term (current) use of anticoagulants: Secondary | ICD-10-CM | POA: Diagnosis not present

## 2023-01-14 DIAGNOSIS — J302 Other seasonal allergic rhinitis: Secondary | ICD-10-CM | POA: Diagnosis not present

## 2023-01-14 DIAGNOSIS — Z8619 Personal history of other infectious and parasitic diseases: Secondary | ICD-10-CM | POA: Diagnosis not present

## 2023-01-21 DIAGNOSIS — M19011 Primary osteoarthritis, right shoulder: Secondary | ICD-10-CM | POA: Diagnosis not present

## 2023-01-21 DIAGNOSIS — M6281 Muscle weakness (generalized): Secondary | ICD-10-CM | POA: Diagnosis not present

## 2023-01-21 DIAGNOSIS — I5023 Acute on chronic systolic (congestive) heart failure: Secondary | ICD-10-CM | POA: Diagnosis not present

## 2023-01-21 DIAGNOSIS — R262 Difficulty in walking, not elsewhere classified: Secondary | ICD-10-CM | POA: Diagnosis not present

## 2023-01-21 DIAGNOSIS — L97523 Non-pressure chronic ulcer of other part of left foot with necrosis of muscle: Secondary | ICD-10-CM | POA: Diagnosis not present

## 2023-01-23 ENCOUNTER — Telehealth: Payer: Self-pay

## 2023-01-23 DIAGNOSIS — S90211A Contusion of right great toe with damage to nail, initial encounter: Secondary | ICD-10-CM | POA: Diagnosis not present

## 2023-01-23 DIAGNOSIS — L84 Corns and callosities: Secondary | ICD-10-CM | POA: Diagnosis not present

## 2023-01-23 DIAGNOSIS — B351 Tinea unguium: Secondary | ICD-10-CM | POA: Diagnosis not present

## 2023-01-23 DIAGNOSIS — I739 Peripheral vascular disease, unspecified: Secondary | ICD-10-CM | POA: Diagnosis not present

## 2023-01-23 NOTE — Telephone Encounter (Signed)
Contacted the patient to confirm anticoagulation status to determine the need for a OV prior to his procedure as well as the need to get medication clearance hold. Pt is unsure if he is taken this as he is living in a nursing facility that handles his medications. I have attempted to reach the staff at [facility] nursing home multiple times I am only able to get thru to the reception desk and unable to leave a message. I have also attempted to call his son and daughter to confirm with them being unsure as well.

## 2023-01-23 NOTE — Telephone Encounter (Signed)
Cancel previsit and colonoscopy  Needs office visit w/ me not urgent  Thank you.

## 2023-01-23 NOTE — Telephone Encounter (Signed)
Dr. [name],   I am reaching out to see how you would like to proceed with this patient. I am not able to confirm wether or not he is still on anticoagulation therapy (see full note attached). He is living in a facility and I am not able to get thru to talk to the staff after multiple attempts. I am not sure if you would like and OV or if that would even help if he unaware as to what he is taking.  [facility] I am not sure if you can reach out to the prescribing physician to confirm if he is still taking Eliquis or just to get clearance in the event he is. I am unaware if he is seeing a cardiologist or if [facility] nursing home is handling all of his care and med management.   Please advise, Thank you.

## 2023-01-23 NOTE — Telephone Encounter (Signed)
Previsit and Colonoscopy canceled. Pt made aware. [facility] and rehab  contacted. Nurse was made aware that the previsit and colonoscopy had been canceled and that the Dr. requested an office visit. Spoke with scheduler at [facility] and rehab and pt was scheduled for an office visit on 04/02/2023 at 8:30 AM with Dr. [name].

## 2023-01-30 DIAGNOSIS — I5032 Chronic diastolic (congestive) heart failure: Secondary | ICD-10-CM | POA: Diagnosis not present

## 2023-01-30 DIAGNOSIS — M5136 Other intervertebral disc degeneration, lumbar region: Secondary | ICD-10-CM | POA: Diagnosis not present

## 2023-01-30 DIAGNOSIS — E039 Hypothyroidism, unspecified: Secondary | ICD-10-CM | POA: Diagnosis not present

## 2023-01-30 DIAGNOSIS — R600 Localized edema: Secondary | ICD-10-CM | POA: Diagnosis not present

## 2023-01-30 DIAGNOSIS — M792 Neuralgia and neuritis, unspecified: Secondary | ICD-10-CM | POA: Diagnosis not present

## 2023-01-30 DIAGNOSIS — Z7901 Long term (current) use of anticoagulants: Secondary | ICD-10-CM | POA: Diagnosis not present

## 2023-01-30 DIAGNOSIS — I739 Peripheral vascular disease, unspecified: Secondary | ICD-10-CM | POA: Diagnosis not present

## 2023-01-30 DIAGNOSIS — I482 Chronic atrial fibrillation, unspecified: Secondary | ICD-10-CM | POA: Diagnosis not present

## 2023-01-31 DIAGNOSIS — M79605 Pain in left leg: Secondary | ICD-10-CM | POA: Diagnosis not present

## 2023-02-04 DIAGNOSIS — D649 Anemia, unspecified: Secondary | ICD-10-CM | POA: Diagnosis not present

## 2023-02-04 DIAGNOSIS — I10 Essential (primary) hypertension: Secondary | ICD-10-CM | POA: Diagnosis not present

## 2023-02-05 ENCOUNTER — Telehealth: Payer: Self-pay | Admitting: Family Medicine

## 2023-02-05 NOTE — Telephone Encounter (Signed)
Contacted [name] to schedule their annual wellness visit. Appointment made for 03/14/2023.  [facility] Care Guide [facility] AWV TEAM Direct Dial: [phone]

## 2023-02-06 ENCOUNTER — Telehealth: Payer: Self-pay | Admitting: *Deleted

## 2023-02-06 ENCOUNTER — Telehealth: Payer: 59

## 2023-02-06 NOTE — Telephone Encounter (Signed)
   CCM RN Visit Note   02/06/23 Name: JAXXON NAEEM MRN: [id number]      DOB: 13-Nov-1950  Subjective: Nicholas [name] is a 72 y.o. year old male who is a primary care patient of [name] MD. The patient was referred to the Chronic Care Management team for assistance with care management needs subsequent to provider initiation of CCM services and plan of care.      An unsuccessful telephone outreach was attempted today to contact the patient about Chronic Care Management needs.    Plan:Telephone follow up appointment with care management team member scheduled for:  upon care guide rescheduling.  Irving Shows [facility], BSN RN Case Manager [facility] [place] [phone]

## 2023-02-07 ENCOUNTER — Telehealth: Payer: Self-pay

## 2023-02-07 NOTE — Progress Notes (Signed)
  Chronic Care Management Note  02/07/2023 Name: Nicholas [name] MRN: [id number] DOB: 1951/09/01  Nicholas [name] is a 72 y.o. year old male who is a primary care patient of [name], MD and is actively engaged with the Chronic Care Management team. I reached out to Nicholas [name] by phone today to assist with re-scheduling a follow up visit with the RN Case Manager  Follow up plan: Unsuccessful telephone outreach attempt made. A HIPAA compliant phone message was left for the patient providing contact information and requesting a return call.  The care management team will reach out to the patient again over the next 7 days.  If patient returns call to provider office, please advise to call CCM Care Guide Penne Lash  at [phone]  Penne Lash, RMA Care Guide [facility]  [place], [place] [address] Direct Dial: [phone] [email or web address]

## 2023-02-14 DIAGNOSIS — I5032 Chronic diastolic (congestive) heart failure: Secondary | ICD-10-CM | POA: Diagnosis not present

## 2023-02-14 DIAGNOSIS — R2689 Other abnormalities of gait and mobility: Secondary | ICD-10-CM | POA: Diagnosis not present

## 2023-02-15 DIAGNOSIS — R2689 Other abnormalities of gait and mobility: Secondary | ICD-10-CM | POA: Diagnosis not present

## 2023-02-15 DIAGNOSIS — I5032 Chronic diastolic (congestive) heart failure: Secondary | ICD-10-CM | POA: Diagnosis not present

## 2023-02-18 ENCOUNTER — Encounter: Payer: 59 | Admitting: Internal Medicine

## 2023-02-18 DIAGNOSIS — R2689 Other abnormalities of gait and mobility: Secondary | ICD-10-CM | POA: Diagnosis not present

## 2023-02-18 DIAGNOSIS — I5032 Chronic diastolic (congestive) heart failure: Secondary | ICD-10-CM | POA: Diagnosis not present

## 2023-02-19 DIAGNOSIS — R062 Wheezing: Secondary | ICD-10-CM | POA: Diagnosis not present

## 2023-02-19 DIAGNOSIS — N390 Urinary tract infection, site not specified: Secondary | ICD-10-CM | POA: Diagnosis not present

## 2023-02-19 DIAGNOSIS — D649 Anemia, unspecified: Secondary | ICD-10-CM | POA: Diagnosis not present

## 2023-02-19 DIAGNOSIS — G43909 Migraine, unspecified, not intractable, without status migrainosus: Secondary | ICD-10-CM | POA: Diagnosis not present

## 2023-02-19 DIAGNOSIS — R0989 Other specified symptoms and signs involving the circulatory and respiratory systems: Secondary | ICD-10-CM | POA: Diagnosis not present

## 2023-02-20 DIAGNOSIS — I5023 Acute on chronic systolic (congestive) heart failure: Secondary | ICD-10-CM | POA: Diagnosis not present

## 2023-02-20 DIAGNOSIS — M6281 Muscle weakness (generalized): Secondary | ICD-10-CM | POA: Diagnosis not present

## 2023-02-20 DIAGNOSIS — I5032 Chronic diastolic (congestive) heart failure: Secondary | ICD-10-CM | POA: Diagnosis not present

## 2023-02-20 DIAGNOSIS — L97523 Non-pressure chronic ulcer of other part of left foot with necrosis of muscle: Secondary | ICD-10-CM | POA: Diagnosis not present

## 2023-02-20 DIAGNOSIS — R509 Fever, unspecified: Secondary | ICD-10-CM | POA: Diagnosis not present

## 2023-02-20 DIAGNOSIS — R262 Difficulty in walking, not elsewhere classified: Secondary | ICD-10-CM | POA: Diagnosis not present

## 2023-02-20 DIAGNOSIS — D72829 Elevated white blood cell count, unspecified: Secondary | ICD-10-CM | POA: Diagnosis not present

## 2023-02-20 DIAGNOSIS — R2689 Other abnormalities of gait and mobility: Secondary | ICD-10-CM | POA: Diagnosis not present

## 2023-02-20 DIAGNOSIS — M19011 Primary osteoarthritis, right shoulder: Secondary | ICD-10-CM | POA: Diagnosis not present

## 2023-02-21 ENCOUNTER — Telehealth: Payer: Self-pay | Admitting: *Deleted

## 2023-02-21 ENCOUNTER — Telehealth: Payer: 59

## 2023-02-21 DIAGNOSIS — I5032 Chronic diastolic (congestive) heart failure: Secondary | ICD-10-CM | POA: Diagnosis not present

## 2023-02-21 DIAGNOSIS — R2689 Other abnormalities of gait and mobility: Secondary | ICD-10-CM | POA: Diagnosis not present

## 2023-02-21 NOTE — Telephone Encounter (Signed)
   CCM RN Visit Note   02/21/23 Name: Nicholas [name] MRN: [id number]      DOB: [birth year]  Subjective: Nicholas [name] is a 72 y.o. year old male who is a primary care patient of [name] MD The patient was referred to the Chronic Care Management team for assistance with care management needs subsequent to provider initiation of CCM services and plan of care.      Second unsuccessful telephone outreach was attempted today to contact the patient about Chronic Care Management needs.    Plan:Telephone follow up appointment with care management team member scheduled for:  upon care guide rescheduling.  Irving Shows [facility], BSN RN Case Manager [facility] [phone]

## 2023-02-22 DIAGNOSIS — D649 Anemia, unspecified: Secondary | ICD-10-CM | POA: Diagnosis not present

## 2023-02-22 DIAGNOSIS — N390 Urinary tract infection, site not specified: Secondary | ICD-10-CM | POA: Diagnosis not present

## 2023-02-22 DIAGNOSIS — R2689 Other abnormalities of gait and mobility: Secondary | ICD-10-CM | POA: Diagnosis not present

## 2023-02-22 DIAGNOSIS — I5032 Chronic diastolic (congestive) heart failure: Secondary | ICD-10-CM | POA: Diagnosis not present

## 2023-02-25 DIAGNOSIS — I5032 Chronic diastolic (congestive) heart failure: Secondary | ICD-10-CM | POA: Diagnosis not present

## 2023-02-25 DIAGNOSIS — R2689 Other abnormalities of gait and mobility: Secondary | ICD-10-CM | POA: Diagnosis not present

## 2023-02-26 DIAGNOSIS — I5032 Chronic diastolic (congestive) heart failure: Secondary | ICD-10-CM | POA: Diagnosis not present

## 2023-02-26 DIAGNOSIS — R2689 Other abnormalities of gait and mobility: Secondary | ICD-10-CM | POA: Diagnosis not present

## 2023-02-27 ENCOUNTER — Other Ambulatory Visit: Payer: Self-pay | Admitting: Family Medicine

## 2023-02-27 DIAGNOSIS — E538 Deficiency of other specified B group vitamins: Secondary | ICD-10-CM

## 2023-02-27 DIAGNOSIS — I5032 Chronic diastolic (congestive) heart failure: Secondary | ICD-10-CM | POA: Diagnosis not present

## 2023-02-27 DIAGNOSIS — E559 Vitamin D deficiency, unspecified: Secondary | ICD-10-CM

## 2023-02-27 DIAGNOSIS — N1831 Chronic kidney disease, stage 3a: Secondary | ICD-10-CM

## 2023-02-27 DIAGNOSIS — E785 Hyperlipidemia, unspecified: Secondary | ICD-10-CM

## 2023-02-27 DIAGNOSIS — E039 Hypothyroidism, unspecified: Secondary | ICD-10-CM

## 2023-02-27 DIAGNOSIS — R2689 Other abnormalities of gait and mobility: Secondary | ICD-10-CM | POA: Diagnosis not present

## 2023-02-27 DIAGNOSIS — E611 Iron deficiency: Secondary | ICD-10-CM

## 2023-02-27 DIAGNOSIS — Z125 Encounter for screening for malignant neoplasm of prostate: Secondary | ICD-10-CM

## 2023-02-28 DIAGNOSIS — I5032 Chronic diastolic (congestive) heart failure: Secondary | ICD-10-CM | POA: Diagnosis not present

## 2023-02-28 DIAGNOSIS — R2689 Other abnormalities of gait and mobility: Secondary | ICD-10-CM | POA: Diagnosis not present

## 2023-03-01 ENCOUNTER — Other Ambulatory Visit: Payer: 59

## 2023-03-01 DIAGNOSIS — I5032 Chronic diastolic (congestive) heart failure: Secondary | ICD-10-CM | POA: Diagnosis not present

## 2023-03-01 DIAGNOSIS — R2689 Other abnormalities of gait and mobility: Secondary | ICD-10-CM | POA: Diagnosis not present

## 2023-03-04 ENCOUNTER — Other Ambulatory Visit (INDEPENDENT_AMBULATORY_CARE_PROVIDER_SITE_OTHER): Payer: 59

## 2023-03-04 ENCOUNTER — Other Ambulatory Visit: Payer: 59

## 2023-03-04 DIAGNOSIS — N1831 Chronic kidney disease, stage 3a: Secondary | ICD-10-CM | POA: Diagnosis not present

## 2023-03-04 DIAGNOSIS — E611 Iron deficiency: Secondary | ICD-10-CM

## 2023-03-04 DIAGNOSIS — Z125 Encounter for screening for malignant neoplasm of prostate: Secondary | ICD-10-CM

## 2023-03-04 DIAGNOSIS — E538 Deficiency of other specified B group vitamins: Secondary | ICD-10-CM

## 2023-03-04 DIAGNOSIS — E785 Hyperlipidemia, unspecified: Secondary | ICD-10-CM

## 2023-03-04 DIAGNOSIS — E559 Vitamin D deficiency, unspecified: Secondary | ICD-10-CM

## 2023-03-04 DIAGNOSIS — I5032 Chronic diastolic (congestive) heart failure: Secondary | ICD-10-CM | POA: Diagnosis not present

## 2023-03-04 DIAGNOSIS — E039 Hypothyroidism, unspecified: Secondary | ICD-10-CM

## 2023-03-04 DIAGNOSIS — R2689 Other abnormalities of gait and mobility: Secondary | ICD-10-CM | POA: Diagnosis not present

## 2023-03-05 DIAGNOSIS — R2689 Other abnormalities of gait and mobility: Secondary | ICD-10-CM | POA: Diagnosis not present

## 2023-03-05 DIAGNOSIS — I5032 Chronic diastolic (congestive) heart failure: Secondary | ICD-10-CM | POA: Diagnosis not present

## 2023-03-05 LAB — COMPREHENSIVE METABOLIC PANEL
ALT: 14 U/L (ref 0–53)
AST: 21 U/L (ref 0–37)
Albumin: 4 g/dL (ref 3.5–5.2)
Alkaline Phosphatase: 74 U/L (ref 39–117)
BUN: 17 mg/dL (ref 6–23)
CO2: 25 mEq/L (ref 19–32)
Calcium: 8.9 mg/dL (ref 8.4–10.5)
Chloride: 101 mEq/L (ref 96–112)
Creatinine, Ser: 1.35 mg/dL (ref 0.40–1.50)
GFR: 52.54 mL/min — ABNORMAL LOW (ref >60.00)
Glucose, Bld: 130 mg/dL — ABNORMAL HIGH (ref 70–99)
Potassium: 4.1 mEq/L (ref 3.5–5.1)
Sodium: 138 mEq/L (ref 135–145)
Total Bilirubin: 0.8 mg/dL (ref 0.2–1.2)
Total Protein: 7.2 g/dL (ref 6.0–8.3)

## 2023-03-05 LAB — MICROALBUMIN / CREATININE URINE RATIO
Creatinine,U: 84.8 mg/dL
Microalb Creat Ratio: 0.8 mg/g (ref 0.0–30.0)
Microalb, Ur: <0.7 mg/dL (ref 0.0–1.9)

## 2023-03-05 LAB — CBC WITH DIFFERENTIAL/PLATELET
Basophils Absolute: 0.1 10*3/uL (ref 0.0–0.1)
Basophils Relative: 0.9 % (ref 0.0–3.0)
Eosinophils Absolute: 0.4 10*3/uL (ref 0.0–0.7)
Eosinophils Relative: 3.8 % (ref 0.0–5.0)
HCT: 41.2 % (ref 39.0–52.0)
Hemoglobin: 13.6 g/dL (ref 13.0–17.0)
Lymphocytes Relative: 14.1 % (ref 12.0–46.0)
Lymphs Abs: 1.4 10*3/uL (ref 0.7–4.0)
MCHC: 32.9 g/dL (ref 30.0–36.0)
MCV: 93.9 fl (ref 78.0–100.0)
Monocytes Absolute: 0.9 10*3/uL (ref 0.1–1.0)
Monocytes Relative: 9.5 % (ref 3.0–12.0)
Neutro Abs: 6.9 10*3/uL (ref 1.4–7.7)
Neutrophils Relative %: 71.7 % (ref 43.0–77.0)
Platelets: 299 10*3/uL (ref 150.0–400.0)
RBC: 4.38 Mil/uL (ref 4.22–5.81)
RDW: 13.8 % (ref 11.5–15.5)
WBC: 9.6 10*3/uL (ref 4.0–10.5)

## 2023-03-05 LAB — LIPID PANEL
Cholesterol: 135 mg/dL (ref 0–200)
HDL: 40.3 mg/dL (ref >39.00)
LDL Cholesterol: 64 mg/dL (ref 0–99)
NonHDL: 94.51
Total CHOL/HDL Ratio: 3
Triglycerides: 152 mg/dL — ABNORMAL HIGH (ref 0.0–149.0)
VLDL: 30.4 mg/dL (ref 0.0–40.0)

## 2023-03-05 LAB — IBC PANEL
Iron: 40 ug/dL — ABNORMAL LOW (ref 42–165)
Saturation Ratios: 14.4 % — ABNORMAL LOW (ref 20.0–50.0)
TIBC: 278.6 ug/dL (ref 250.0–450.0)
Transferrin: 199 mg/dL — ABNORMAL LOW (ref 212.0–360.0)

## 2023-03-05 LAB — TSH: TSH: 5.91 u[IU]/mL — ABNORMAL HIGH (ref 0.35–5.50)

## 2023-03-05 LAB — VITAMIN D 25 HYDROXY (VIT D DEFICIENCY, FRACTURES): VITD: 53.11 ng/mL (ref 30.00–100.00)

## 2023-03-05 LAB — PSA: PSA: 1.95 ng/mL (ref 0.10–4.00)

## 2023-03-05 LAB — VITAMIN B12: Vitamin B-12: 227 pg/mL (ref 211–911)

## 2023-03-05 LAB — FERRITIN: Ferritin: 187.6 ng/mL (ref 22.0–322.0)

## 2023-03-06 DIAGNOSIS — I5032 Chronic diastolic (congestive) heart failure: Secondary | ICD-10-CM | POA: Diagnosis not present

## 2023-03-06 DIAGNOSIS — R2689 Other abnormalities of gait and mobility: Secondary | ICD-10-CM | POA: Diagnosis not present

## 2023-03-07 DIAGNOSIS — I5032 Chronic diastolic (congestive) heart failure: Secondary | ICD-10-CM | POA: Diagnosis not present

## 2023-03-07 DIAGNOSIS — R2689 Other abnormalities of gait and mobility: Secondary | ICD-10-CM | POA: Diagnosis not present

## 2023-03-08 ENCOUNTER — Encounter: Payer: Self-pay | Admitting: Family Medicine

## 2023-03-08 ENCOUNTER — Ambulatory Visit (INDEPENDENT_AMBULATORY_CARE_PROVIDER_SITE_OTHER): Payer: 59 | Admitting: Family Medicine

## 2023-03-08 VITALS — BP 122/66 | HR 61 | Temp 97.6°F | Ht 68.5 in | Wt 248.4 lb

## 2023-03-08 DIAGNOSIS — R2689 Other abnormalities of gait and mobility: Secondary | ICD-10-CM | POA: Diagnosis not present

## 2023-03-08 DIAGNOSIS — K2960 Other gastritis without bleeding: Secondary | ICD-10-CM

## 2023-03-08 DIAGNOSIS — E559 Vitamin D deficiency, unspecified: Secondary | ICD-10-CM

## 2023-03-08 DIAGNOSIS — E039 Hypothyroidism, unspecified: Secondary | ICD-10-CM

## 2023-03-08 DIAGNOSIS — K269 Duodenal ulcer, unspecified as acute or chronic, without hemorrhage or perforation: Secondary | ICD-10-CM

## 2023-03-08 DIAGNOSIS — K2100 Gastro-esophageal reflux disease with esophagitis, without bleeding: Secondary | ICD-10-CM

## 2023-03-08 DIAGNOSIS — Z0000 Encounter for general adult medical examination without abnormal findings: Secondary | ICD-10-CM

## 2023-03-08 DIAGNOSIS — E611 Iron deficiency: Secondary | ICD-10-CM

## 2023-03-08 DIAGNOSIS — Z96651 Presence of right artificial knee joint: Secondary | ICD-10-CM

## 2023-03-08 DIAGNOSIS — Z8673 Personal history of transient ischemic attack (TIA), and cerebral infarction without residual deficits: Secondary | ICD-10-CM

## 2023-03-08 DIAGNOSIS — E538 Deficiency of other specified B group vitamins: Secondary | ICD-10-CM

## 2023-03-08 DIAGNOSIS — N1831 Chronic kidney disease, stage 3a: Secondary | ICD-10-CM

## 2023-03-08 DIAGNOSIS — E785 Hyperlipidemia, unspecified: Secondary | ICD-10-CM

## 2023-03-08 DIAGNOSIS — G4733 Obstructive sleep apnea (adult) (pediatric): Secondary | ICD-10-CM

## 2023-03-08 DIAGNOSIS — Z87898 Personal history of other specified conditions: Secondary | ICD-10-CM

## 2023-03-08 DIAGNOSIS — R413 Other amnesia: Secondary | ICD-10-CM

## 2023-03-08 DIAGNOSIS — I4891 Unspecified atrial fibrillation: Secondary | ICD-10-CM

## 2023-03-08 DIAGNOSIS — I5032 Chronic diastolic (congestive) heart failure: Secondary | ICD-10-CM

## 2023-03-08 DIAGNOSIS — G43109 Migraine with aura, not intractable, without status migrainosus: Secondary | ICD-10-CM

## 2023-03-08 DIAGNOSIS — Z89412 Acquired absence of left great toe: Secondary | ICD-10-CM

## 2023-03-08 DIAGNOSIS — Z7189 Other specified counseling: Secondary | ICD-10-CM

## 2023-03-08 DIAGNOSIS — L97522 Non-pressure chronic ulcer of other part of left foot with fat layer exposed: Secondary | ICD-10-CM

## 2023-03-08 MED ORDER — VITAMIN B-12 1000 MCG PO TABS: 1000.0000 ug | ORAL_TABLET | Freq: Every day | ORAL | Status: DC

## 2023-03-08 MED ORDER — CYANOCOBALAMIN 1000 MCG/ML IJ SOLN
1000.0000 ug | Freq: Once | INTRAMUSCULAR | Status: AC
Start: 2023-03-08 — End: 2023-03-08
  Administered 2023-03-08: 1000 ug via INTRAMUSCULAR

## 2023-03-08 NOTE — Patient Instructions (Addendum)
If interested, check with pharmacy on RSV vaccine.  B12 shot today then start vitamin B12 daily over the counter.  Increase levothyroxine to daily.  Kidney function is worse- ensure staying well hydrated.  Schedule appointment with Dr Lajoyce Corners and cardiologist Dr Nadara Eaton.  We will cancel wellness visit for next week. Return to see me in 4-6 months.

## 2023-03-08 NOTE — Progress Notes (Unsigned)
Ph: [phone] Fax: [phone]   Patient ID: Nicholas [name], male    DOB: [birth year], 72 y.o.   MRN: [id number]  This visit was conducted in person.  BP 122/66   Pulse 61   Temp 97.6 F (36.4 C) (Temporal)   Ht 5' 8.5" (1.74 m)   Wt 248 lb 6 oz (112.7 kg)   SpO2 95%   BMI 37.22 kg/m    CC: AMW/CPE Subjective:   HPI: Nicholas [name] is a 72 y.o. male presenting on 03/08/2023 for Medicare Wellness   Did not see health advisor this year.   Hearing Screening   500Hz  1000Hz  2000Hz  4000Hz   Right ear 20 40 40 0  Left ear 20 40 40 0  Vision Screening - Comments:: Last eye exam within past yr.  Flowsheet Row Chronic Care Management from 12/11/2022 in [facility] [place]  PHQ-2 Total Score 0          12/11/2022   10:05 AM 10/30/2022    3:12 PM 10/01/2022    3:30 PM 09/10/2022    3:04 PM 02/28/2022   10:41 AM  Fall Risk   Falls in the past year? 1 1  1 1   Number falls in past yr: 0 1  1 1   Comment    States maybe a dozen   Injury with Fall? 0 1  1 0  Risk for fall due to : History of fall(s)  Other (Comment);History of fall(s) History of fall(s);Impaired balance/gait;Impaired mobility;Orthopedic patient History of fall(s)  Risk for fall due to: Comment   weak w/c   Follow up Falls evaluation completed;Falls prevention discussed;Education provided  Education provided Falls evaluation completed Falls prevention discussed   Last seen 10/2022.  Now staying at [facility]. Medication reconciliation is performed with his facility medication list he brings. Sister drove him here today.   Marked weight gain noted over the past 4 months (30 lbs).  Several ER visits /hospitalizations late last year, latest hospitalization 10/2022 for acute sepsis thought due to Pseudomonas UTI. Latest ER visit 11/2022 for fall with head injury and scalp laceration closed with 7 staples. Staples subsequently removed at facility.   Migraine - followed by neurology on  Depakote ER 500mg  daily for prevention. He never tried Ajovy. He uses Ubrelvy for acute migraine treatment as needed.   Upcoming GI appt 04/02/2023 prior to further interventions.   He uses bedside urinal for when he can't to bathroom in time.   Thyroid symptoms - notes some constipation, cold intolerance, fatigue, weight gain.   Activities of Daily Living:     Bathing- independent    Dressing- needs assistance    Eating- independent    Toileting- needs assistance    Transferring- independent    Continence- needs assistance Overall Assessment: needs assistance  Instrumental Activities of Daily Living:     Transportation- dependent    Meal/Food Preparation- independent    Shopping Errands- dependent    Housekeeping/Chores- dependent    Money Management/Finances- independent    Medication Management- dependent    Ability to Use Telephone- independent    Laundry- dependent Overall Assessment: dependent   Preventative: COLONOSCOPY 11/2012 diverticulosis, rpt 10 yrs [name]) Prostate cancer screening - declines DRE, continues PSA  Lung cancer screening - not eligible  DEXA - pt states he had this done at [facility] but I've never received records  Flu shot yearly  COVID vaccine - declines  Td 2007  Pneumovax 2012,  02/2021, prevnar-13 2017  Zostavax - ~2014  Shingrix - 11/2017, 04/2018  Advanced directive discussion - has not set up. Son Marquavius Vanvooren would be HCPOA. Packet previously provided. Full code per facility records.  Seat belt use discussed  Sunscreen use discussed. No changing moles on skin.  Non smoker  Alcohol - none  Dentist due, has not seen in last few years Eye exam yearly Bowel - some diarrhea/constipation with accidents  Bladder - some urge incontinence with accidents   Widower 2016 - wife passed from ESRD related illness Lives in [facility] in [place]. [employer]  Occ: landscaping Edu: HS Activity: no regular exercise - residual deficits due  to multiple surgeries  Diet: some water, fruits/vegetables daily     Relevant past medical, surgical, family and social history reviewed and updated as indicated. Interim medical history since our last visit reviewed. Allergies and medications reviewed and updated. Outpatient Medications Prior to Visit  Medication Sig Dispense Refill   acetaminophen (TYLENOL) 500 MG tablet Take 1,000 mg by mouth 3 (three) times daily.     apixaban (ELIQUIS) 5 MG TABS tablet Take 1 tablet (5 mg total) by mouth 2 (two) times daily. 60 tablet 3   atorvastatin (LIPITOR) 20 MG tablet TAKE ONE TABLET BY MOUTH ONCE DAILY 90 tablet 3   azelastine (ASTELIN) 0.1 % nasal spray Place 1 spray into both nostrils daily. Use in each nostril as directed     B Complex Vitamins (B-COMPLEX/B-12 PO) Take 1 tablet by mouth daily.     cetirizine (ZYRTEC) 10 MG tablet Take 1 tablet (10 mg total) by mouth daily. 30 tablet 0   Cholecalciferol (VITAMIN D3) 25 MCG (1000 UT) CAPS Take 1 capsule (1,000 Units total) by mouth daily. 90 capsule 1   divalproex (DEPAKOTE ER) 500 MG 24 hr tablet TAKE ONE TABLET BY MOUTH EVERYDAY AT BEDTIME 30 tablet 5   EPINEPHrine 0.3 mg/0.3 mL IJ SOAJ injection Inject 0.3 mg into the muscle as needed for anaphylaxis. (bee stings) 2 each 1   ferrous sulfate 325 (65 FE) MG EC tablet Take 325 mg by mouth daily with breakfast.     fluticasone (FLONASE) 50 MCG/ACT nasal spray Place 2 sprays into both nostrils daily. 16 g 0   metoprolol succinate (TOPROL-XL) 100 MG 24 hr tablet Take 1 tablet (100 mg total) by mouth at bedtime. Take with or immediately following a meal.     nitroGLYCERIN (NITROSTAT) 0.4 MG SL tablet Place 1 tablet (0.4 mg total) under the tongue every 5 (five) minutes as needed for chest pain. 20 tablet 3   ondansetron (ZOFRAN-ODT) 4 MG disintegrating tablet TAKE ONE TABLET BY MOUTH every EIGHT hours AS NEEDED 20 tablet 3   tamsulosin (FLOMAX) 0.4 MG CAPS capsule TAKE ONE CAPSULE BY MOUTH ONCE DAILY  30 capsule 2   tiZANidine (ZANAFLEX) 4 MG tablet TAKE ONE TABLET BY MOUTH every SIX hours AS NEEDED FOR acute HEADACHE 20 tablet 1   Ubrogepant (UBRELVY) 100 MG TABS Take 100 mg by mouth as needed (take 1 tablet at onset of headache, may repeat in hours, max is 200 mg in 24 hours). 12 tablet 11   amitriptyline (ELAVIL) 50 MG tablet Take 1 tablet (50 mg total) by mouth at bedtime. 90 tablet 1   furosemide (LASIX) 20 MG tablet TAKE ONE TABLET BY MOUTH EVERY MORNING 90 tablet 1   gabapentin (NEURONTIN) 300 MG capsule TAKE ONE CAPSULE BY MOUTH EVERY MORNING and TAKE TWO CAPSULES BY MOUTH AT  NOON and TAKE TWO CAPSULES BY MOUTH EVERY EVENING 450 capsule 1   levothyroxine (SYNTHROID) 112 MCG tablet Take 1 tablet (112 mcg total) by mouth daily before breakfast. 90 tablet 3   pantoprazole (PROTONIX) 40 MG tablet TAKE ONE TABLET BY MOUTH ONCE DAILY 90 tablet 0   amitriptyline (ELAVIL) 50 MG tablet Take 0.5 tablets (25 mg total) by mouth in the morning and at bedtime.     furosemide (LASIX) 20 MG tablet Take 1 tablet (20 mg total) by mouth 2 (two) times daily.     gabapentin (NEURONTIN) 300 MG capsule Take 2 capsules (600 mg total) by mouth 3 (three) times daily.     levothyroxine (SYNTHROID) 125 MCG tablet Take 1 tablet (125 mcg total) by mouth daily before breakfast. 90 tablet 4   pantoprazole (PROTONIX) 40 MG tablet Take 1 tablet (40 mg total) by mouth every other day.     No facility-administered medications prior to visit.     Per HPI unless specifically indicated in ROS section below Review of Systems  Constitutional:  Positive for chills (2 wks ago) and fever (2 wks ago). Negative for activity change, appetite change, fatigue and unexpected weight change.  HENT:  Negative for hearing loss.   Eyes:  Negative for visual disturbance.  Respiratory:  Negative for cough, chest tightness, shortness of breath and wheezing.   Cardiovascular:  Positive for leg swelling (feet). Negative for chest pain and  palpitations.  Gastrointestinal:  Positive for constipation, diarrhea and vomiting. Negative for abdominal distention, abdominal pain, blood in stool and nausea.  Genitourinary:  Negative for difficulty urinating and hematuria.  Musculoskeletal:  Negative for arthralgias, myalgias and neck pain.  Skin:  Negative for rash.  Neurological:  Positive for headaches. Negative for dizziness, seizures and syncope.  Hematological:  Negative for adenopathy. Does not bruise/bleed easily.  Psychiatric/Behavioral:  Positive for dysphoric mood (occ - when thinking about his wife). The patient is not nervous/anxious.     Objective:  BP 122/66   Pulse 61   Temp 97.6 F (36.4 C) (Temporal)   Ht 5' 8.5" (1.74 m)   Wt 248 lb 6 oz (112.7 kg)   SpO2 95%   BMI 37.22 kg/m   Wt Readings from Last 3 Encounters:  03/08/23 248 lb 6 oz (112.7 kg)  12/13/22 224 lb 13.9 oz (102 kg)  11/05/22 218 lb 14.7 oz (99.3 kg)      Physical Exam Vitals and nursing note reviewed.  Constitutional:      General: He is not in acute distress.    Appearance: Normal appearance. He is well-developed. He is not ill-appearing.  HENT:     Head: Normocephalic and atraumatic.     Right Ear: Hearing, tympanic membrane, ear canal and external ear normal.     Left Ear: Hearing, tympanic membrane, ear canal and external ear normal.     Mouth/Throat:     Mouth: Mucous membranes are moist.     Pharynx: Oropharynx is clear. No oropharyngeal exudate or posterior oropharyngeal erythema.  Eyes:     General: No scleral icterus.    Extraocular Movements: Extraocular movements intact.     Conjunctiva/sclera: Conjunctivae normal.     Pupils: Pupils are equal, round, and reactive to light.  Neck:     Thyroid: No thyroid mass or thyromegaly.  Cardiovascular:     Rate and Rhythm: Normal rate and regular rhythm.     Pulses: Normal pulses.  Radial pulses are 2+ on the right side and 2+ on the left side.     Heart sounds: Normal  heart sounds. No murmur heard. Pulmonary:     Effort: Pulmonary effort is normal. No respiratory distress.     Breath sounds: Normal breath sounds. No wheezing, rhonchi or rales.  Abdominal:     General: Bowel sounds are normal. There is no distension.     Palpations: Abdomen is soft. There is no mass.     Tenderness: There is no abdominal tenderness. There is no guarding or rebound.     Hernia: No hernia is present.  Musculoskeletal:        General: Deformity present. Normal range of motion.     Cervical back: Normal range of motion and neck supple.     Right lower leg: No edema.     Left lower leg: No edema.     Comments:  R knee remains fixed in extension after fusion S/p ray amputation to left foot Chronic sore with callus to L medial sole at 1st MTPJ  Lymphadenopathy:     Cervical: No cervical adenopathy.  Skin:    General: Skin is warm and dry.     Findings: No rash.  Neurological:     General: No focal deficit present.     Mental Status: He is alert and oriented to person, place, and time.     Comments:  Recall 1/3, 3/3 with cue  Calculation 5/5 DLROW  Psychiatric:        Mood and Affect: Mood normal.        Behavior: Behavior normal.        Thought Content: Thought content normal.        Judgment: Judgment normal.       Results for orders placed or performed in visit on 03/04/23  Microalbumin / creatinine urine ratio  Result Value Ref Range   Microalb, Ur <0.7 0.0 - 1.9 mg/dL   Creatinine,U 09.8 mg/dL   Microalb Creat Ratio 0.8 0.0 - 30.0 mg/g  VITAMIN D 25 Hydroxy (Vit-D Deficiency, Fractures)  Result Value Ref Range   VITD 53.11 30.00 - 100.00 ng/mL  IBC panel  Result Value Ref Range   Iron 40 (L) 42 - 165 ug/dL   Transferrin 119.1 (L) 212.0 - 360.0 mg/dL   Saturation Ratios 47.8 (L) 20.0 - 50.0 %   TIBC 278.6 250.0 - 450.0 mcg/dL  Ferritin  Result Value Ref Range   Ferritin 187.6 22.0 - 322.0 ng/mL  Vitamin B12  Result Value Ref Range   Vitamin B-12  227 211 - 911 pg/mL  CBC with Differential/Platelet  Result Value Ref Range   WBC 9.6 4.0 - 10.5 K/uL   RBC 4.38 4.22 - 5.81 Mil/uL   Hemoglobin 13.6 13.0 - 17.0 g/dL   HCT 29.5 62.1 - 30.8 %   MCV 93.9 78.0 - 100.0 fl   MCHC 32.9 30.0 - 36.0 g/dL   RDW 65.7 84.6 - 96.2 %   Platelets 299.0 150.0 - 400.0 K/uL   Neutrophils Relative % 71.7 43.0 - 77.0 %   Lymphocytes Relative 14.1 12.0 - 46.0 %   Monocytes Relative 9.5 3.0 - 12.0 %   Eosinophils Relative 3.8 0.0 - 5.0 %   Basophils Relative 0.9 0.0 - 3.0 %   Neutro Abs 6.9 1.4 - 7.7 K/uL   Lymphs Abs 1.4 0.7 - 4.0 K/uL   Monocytes Absolute 0.9 0.1 - 1.0 K/uL   Eosinophils Absolute 0.4  0.0 - 0.7 K/uL   Basophils Absolute 0.1 0.0 - 0.1 K/uL  PSA  Result Value Ref Range   PSA 1.95 0.10 - 4.00 ng/mL  TSH  Result Value Ref Range   TSH 5.91 (H) 0.35 - 5.50 uIU/mL  Comprehensive metabolic panel  Result Value Ref Range   Sodium 138 135 - 145 mEq/L   Potassium 4.1 3.5 - 5.1 mEq/L   Chloride 101 96 - 112 mEq/L   CO2 25 19 - 32 mEq/L   Glucose, Bld 130 (H) 70 - 99 mg/dL   BUN 17 6 - 23 mg/dL   Creatinine, Ser 9.14 0.40 - 1.50 mg/dL   Total Bilirubin 0.8 0.2 - 1.2 mg/dL   Alkaline Phosphatase 74 39 - 117 U/L   AST 21 0 - 37 U/L   ALT 14 0 - 53 U/L   Total Protein 7.2 6.0 - 8.3 g/dL   Albumin 4.0 3.5 - 5.2 g/dL   GFR 78.29 (L) >56.21 mL/min   Calcium 8.9 8.4 - 10.5 mg/dL  Lipid panel  Result Value Ref Range   Cholesterol 135 0 - 200 mg/dL   Triglycerides 308.6 (H) 0.0 - 149.0 mg/dL   HDL 57.84 >69.62 mg/dL   VLDL 95.2 0.0 - 84.1 mg/dL   LDL Cholesterol 64 0 - 99 mg/dL   Total CHOL/HDL Ratio 3    NonHDL 94.51     Assessment & Plan:   Problem List Items Addressed This Visit     History of total right knee replacement   Migraine    Managed by neurology on ppx depakote ER 500mg  nightly with Bernita Raisin abortively       Relevant Medications   furosemide (LASIX) 20 MG tablet   amitriptyline (ELAVIL) 50 MG tablet   gabapentin  (NEURONTIN) 300 MG capsule   GERD (gastroesophageal reflux disease)    Stable period on pantoprazole 40mg  QOD - continue this in h/o erosive gastritis and duodenal ulcers      Relevant Medications   pantoprazole (PROTONIX) 40 MG tablet   Iron deficiency    Levels remain low with normal ferritin. Continue ferrous sulfate 325mg  daily.       Severe obesity (BMI 35.0-39.9) with comorbidity (HCC)    30 lb weight gain noted over the past 4 months.  Obesity complicated by comorbidities of osteoarthritis, migraine, GERD, CKD, HLD, HTN, h/o stroke and atrial fibrillation      Hypothyroidism    TSH elevated, and endorses some hypothyroid symptoms. Increase levothyroxine to daily, rec recheck TSH in 2 months.       Relevant Medications   levothyroxine (SYNTHROID) 125 MCG tablet   Medicare annual wellness visit, subsequent - Primary (Chronic)    I have personally reviewed the Medicare Annual Wellness questionnaire and have noted 1. The patient's medical and social history 2. Their use of alcohol, tobacco or illicit drugs 3. Their current medications and supplements 4. The patient's functional ability including ADL's, fall risks, home safety risks and hearing or visual impairment. Cognitive function has been assessed and addressed as indicated.  5. Diet and physical activity 6. Evidence for depression or mood disorders The patients weight, height, BMI have been recorded in the chart. I have made referrals, counseling and provided education to the patient based on review of the above and I have provided the pt with a written personalized care plan for preventive services. Provider list updated.. See scanned questionairre as needed for further documentation. Reviewed preventative protocols and updated unless pt declined.  History of CVA in adulthood    Continue statin and eliquis.       CKD (chronic kidney disease) stage 3, GFR 30-59 ml/min (HCC)    Recent GFRs were >60  however marked drop noted over the last 2 readings (Cr from 0.7 to 1.35).  Encouraged good hydration status, good blood pressure control.  Denies voiding changes.  Will continue lasix 20mg  BID for now.      Health maintenance examination (Chronic)    Preventative protocols reviewed and updated unless pt declined. Discussed healthy diet and lifestyle.       Chronic diastolic CHF (congestive heart failure) (HCC)    Seems largely euvolemic despite increased weight. Will continue lasix 20mg  BID.      Relevant Medications   furosemide (LASIX) 20 MG tablet   OSA (obstructive sleep apnea)    H/o this not on CPAP      Dyslipidemia    Stable period on atorvastatin 20mg  daily. The ASCVD Risk score (Arnett DK, et al., 2019) failed to calculate for the following reasons:   The patient has a prior MI or stroke diagnosis       Status post amputation of great toe, left (HCC)   Vitamin B12 deficiency    B12 levels low. Will provide with B12 shot today and then recommend he start oral B12 replacement daily OTC.       Vitamin D deficiency    Continue vit D 1000 IU daily.       Chronic toe ulcer, left, with fat layer exposed (HCC)    Chronic ulcerative callus to sole of left 1st MTPJ - recommend ortho f/u. He will schedule appt with Dr Lajoyce Corners.       Memory difficulty    Recall 1/3. Largely dependent in ADLs and IADLs. Now residing at [facility] facility and doing well.       History of urinary retention    During hospitalization 09/2022. He is now on flomax daily      Atrial fibrillation (HCC)    Continue eliquis. Encouraged cardiology f/u - he will contact Dr Jacinto Halim.       Relevant Medications   furosemide (LASIX) 20 MG tablet   Advanced directives, counseling/discussion (Chronic)    Advanced directive discussion - has not set up. Son Asbery Kroese would be HCPOA. Packet previously provided. Full code per facility records.         Meds ordered this encounter   Medications   cyanocobalamin (VITAMIN B12) 1000 MCG tablet    Sig: Take 1 tablet (1,000 mcg total) by mouth daily.   cyanocobalamin (VITAMIN B12) injection 1,000 mcg    No orders of the defined types were placed in this encounter.   Patient Instructions  If interested, check with pharmacy on RSV vaccine.  B12 shot today then start vitamin B12 daily over the counter.  Increase levothyroxine to daily.  Kidney function is worse- ensure staying well hydrated.  Schedule appointment with Dr Lajoyce Corners and cardiologist Dr Nadara Eaton.  We will cancel wellness visit for next week. Return to see me in 4-6 months.   Follow up plan: Return in about 6 months (around 09/08/2023) for follow up visit.  [name], MD

## 2023-03-09 ENCOUNTER — Encounter: Payer: Self-pay | Admitting: Family Medicine

## 2023-03-09 DIAGNOSIS — Z7189 Other specified counseling: Secondary | ICD-10-CM | POA: Insufficient documentation

## 2023-03-09 NOTE — Assessment & Plan Note (Deleted)
Chronic ulcerative callus to sole of left 1st MTPJ - recommend ortho f/u. He will schedule appt with Dr Duda.  

## 2023-03-09 NOTE — Assessment & Plan Note (Signed)
Continue statin and eliquis.  

## 2023-03-09 NOTE — Assessment & Plan Note (Signed)
Chronic ulcerative callus to sole of left 1st MTPJ - recommend ortho f/u. He will schedule appt with Dr Lajoyce Corners.

## 2023-03-09 NOTE — Assessment & Plan Note (Signed)
Recall 1/3. Largely dependent in ADLs and IADLs. Now residing at [facility] facility and doing well.

## 2023-03-09 NOTE — Assessment & Plan Note (Signed)
H/o this not on CPAP

## 2023-03-09 NOTE — Assessment & Plan Note (Signed)

## 2023-03-09 NOTE — Assessment & Plan Note (Addendum)
Stable period on pantoprazole 40mg  QOD - continue this in h/o erosive gastritis and duodenal ulcers

## 2023-03-09 NOTE — Assessment & Plan Note (Addendum)
Recent GFRs were >60 however marked drop noted over the last 2 readings (Cr from 0.7 to 1.35).  Encouraged good hydration status, good blood pressure control.  Denies voiding changes.  Will continue lasix 20mg  BID for now.

## 2023-03-09 NOTE — Assessment & Plan Note (Signed)
Advanced directive discussion - has not set up. Son Dijon Mcmeans would be HCPOA. Packet previously provided. Full code per facility records.

## 2023-03-09 NOTE — Assessment & Plan Note (Signed)
During hospitalization 09/2022. He is now on flomax daily

## 2023-03-09 NOTE — Assessment & Plan Note (Addendum)
Continue eliquis. Encouraged cardiology f/u - he will contact Dr Jacinto Halim.

## 2023-03-09 NOTE — Assessment & Plan Note (Signed)
Managed by neurology on ppx depakote ER 500mg  nightly with Bernita Raisin abortively

## 2023-03-09 NOTE — Assessment & Plan Note (Signed)
Continue vit D 1000 IU daily. 

## 2023-03-09 NOTE — Assessment & Plan Note (Signed)
TSH elevated, and endorses some hypothyroid symptoms. Increase levothyroxine to daily, rec recheck TSH in 2 months.

## 2023-03-09 NOTE — Assessment & Plan Note (Signed)
Stable period on atorvastatin 20mg  daily. The ASCVD Risk score (Arnett DK, et al., 2019) failed to calculate for the following reasons:   The patient has a prior MI or stroke diagnosis

## 2023-03-09 NOTE — Assessment & Plan Note (Signed)
B12 levels low. Will provide with B12 shot today and then recommend he start oral B12 replacement daily OTC.

## 2023-03-09 NOTE — Assessment & Plan Note (Signed)
Preventative protocols reviewed and updated unless pt declined. Discussed healthy diet and lifestyle.  

## 2023-03-09 NOTE — Assessment & Plan Note (Signed)
Seems largely euvolemic despite increased weight. Will continue lasix 20mg  BID.

## 2023-03-09 NOTE — Assessment & Plan Note (Signed)
30 lb weight gain noted over the past 4 months.  Obesity complicated by comorbidities of osteoarthritis, migraine, GERD, CKD, HLD, HTN, h/o stroke and atrial fibrillation

## 2023-03-09 NOTE — Assessment & Plan Note (Signed)
Levels remain low with normal ferritin. Continue ferrous sulfate 325mg  daily.

## 2023-03-11 DIAGNOSIS — I5032 Chronic diastolic (congestive) heart failure: Secondary | ICD-10-CM | POA: Diagnosis not present

## 2023-03-11 DIAGNOSIS — R2689 Other abnormalities of gait and mobility: Secondary | ICD-10-CM | POA: Diagnosis not present

## 2023-03-12 DIAGNOSIS — I5032 Chronic diastolic (congestive) heart failure: Secondary | ICD-10-CM | POA: Diagnosis not present

## 2023-03-12 DIAGNOSIS — R2689 Other abnormalities of gait and mobility: Secondary | ICD-10-CM | POA: Diagnosis not present

## 2023-03-13 ENCOUNTER — Telehealth: Payer: Self-pay | Admitting: Family Medicine

## 2023-03-13 DIAGNOSIS — R2689 Other abnormalities of gait and mobility: Secondary | ICD-10-CM | POA: Diagnosis not present

## 2023-03-13 DIAGNOSIS — I5032 Chronic diastolic (congestive) heart failure: Secondary | ICD-10-CM | POA: Diagnosis not present

## 2023-03-13 NOTE — Telephone Encounter (Signed)
[facility] called to cancel the pt's future appts due to him now being at their facility & being under the care of the on-site provider. Rehab states pt will no longer see Dr. Reece Agar anymore. Call back # [phone]

## 2023-03-13 NOTE — Telephone Encounter (Signed)
Health and rehab agency contacted the office in regards to this patient, stating they would like a copy of recent lab results. Asked for them to be faxed to their facility with att to nursing department, fax number is [phone]. Please advise, thank you.

## 2023-03-14 DIAGNOSIS — R2689 Other abnormalities of gait and mobility: Secondary | ICD-10-CM | POA: Diagnosis not present

## 2023-03-14 DIAGNOSIS — I5032 Chronic diastolic (congestive) heart failure: Secondary | ICD-10-CM | POA: Diagnosis not present

## 2023-03-14 NOTE — Telephone Encounter (Signed)
Faxed results on 03/13/23.

## 2023-03-15 DIAGNOSIS — R2689 Other abnormalities of gait and mobility: Secondary | ICD-10-CM | POA: Diagnosis not present

## 2023-03-15 DIAGNOSIS — I5032 Chronic diastolic (congestive) heart failure: Secondary | ICD-10-CM | POA: Diagnosis not present

## 2023-03-18 DIAGNOSIS — I5032 Chronic diastolic (congestive) heart failure: Secondary | ICD-10-CM | POA: Diagnosis not present

## 2023-03-18 DIAGNOSIS — R2689 Other abnormalities of gait and mobility: Secondary | ICD-10-CM | POA: Diagnosis not present

## 2023-03-18 NOTE — Telephone Encounter (Signed)
Noted  

## 2023-03-19 DIAGNOSIS — R2689 Other abnormalities of gait and mobility: Secondary | ICD-10-CM | POA: Diagnosis not present

## 2023-03-19 DIAGNOSIS — I5032 Chronic diastolic (congestive) heart failure: Secondary | ICD-10-CM | POA: Diagnosis not present

## 2023-03-20 DIAGNOSIS — I5032 Chronic diastolic (congestive) heart failure: Secondary | ICD-10-CM | POA: Diagnosis not present

## 2023-03-20 DIAGNOSIS — R2689 Other abnormalities of gait and mobility: Secondary | ICD-10-CM | POA: Diagnosis not present

## 2023-03-21 DIAGNOSIS — R2689 Other abnormalities of gait and mobility: Secondary | ICD-10-CM | POA: Diagnosis not present

## 2023-03-21 DIAGNOSIS — I5032 Chronic diastolic (congestive) heart failure: Secondary | ICD-10-CM | POA: Diagnosis not present

## 2023-03-22 DIAGNOSIS — I5032 Chronic diastolic (congestive) heart failure: Secondary | ICD-10-CM | POA: Diagnosis not present

## 2023-03-22 DIAGNOSIS — R2689 Other abnormalities of gait and mobility: Secondary | ICD-10-CM | POA: Diagnosis not present

## 2023-03-23 DIAGNOSIS — L97523 Non-pressure chronic ulcer of other part of left foot with necrosis of muscle: Secondary | ICD-10-CM | POA: Diagnosis not present

## 2023-03-23 DIAGNOSIS — R262 Difficulty in walking, not elsewhere classified: Secondary | ICD-10-CM | POA: Diagnosis not present

## 2023-03-23 DIAGNOSIS — M6281 Muscle weakness (generalized): Secondary | ICD-10-CM | POA: Diagnosis not present

## 2023-03-23 DIAGNOSIS — I5023 Acute on chronic systolic (congestive) heart failure: Secondary | ICD-10-CM | POA: Diagnosis not present

## 2023-03-23 DIAGNOSIS — M19011 Primary osteoarthritis, right shoulder: Secondary | ICD-10-CM | POA: Diagnosis not present

## 2023-03-25 DIAGNOSIS — R2689 Other abnormalities of gait and mobility: Secondary | ICD-10-CM | POA: Diagnosis not present

## 2023-03-25 DIAGNOSIS — I5032 Chronic diastolic (congestive) heart failure: Secondary | ICD-10-CM | POA: Diagnosis not present

## 2023-03-26 DIAGNOSIS — R2689 Other abnormalities of gait and mobility: Secondary | ICD-10-CM | POA: Diagnosis not present

## 2023-03-26 DIAGNOSIS — I5032 Chronic diastolic (congestive) heart failure: Secondary | ICD-10-CM | POA: Diagnosis not present

## 2023-03-27 DIAGNOSIS — I5032 Chronic diastolic (congestive) heart failure: Secondary | ICD-10-CM | POA: Diagnosis not present

## 2023-03-27 DIAGNOSIS — R2689 Other abnormalities of gait and mobility: Secondary | ICD-10-CM | POA: Diagnosis not present

## 2023-03-28 DIAGNOSIS — I5032 Chronic diastolic (congestive) heart failure: Secondary | ICD-10-CM | POA: Diagnosis not present

## 2023-03-28 DIAGNOSIS — R2689 Other abnormalities of gait and mobility: Secondary | ICD-10-CM | POA: Diagnosis not present

## 2023-03-29 DIAGNOSIS — R2689 Other abnormalities of gait and mobility: Secondary | ICD-10-CM | POA: Diagnosis not present

## 2023-03-29 DIAGNOSIS — I5032 Chronic diastolic (congestive) heart failure: Secondary | ICD-10-CM | POA: Diagnosis not present

## 2023-04-02 ENCOUNTER — Telehealth: Payer: Self-pay

## 2023-04-02 ENCOUNTER — Ambulatory Visit (INDEPENDENT_AMBULATORY_CARE_PROVIDER_SITE_OTHER): Payer: 59 | Admitting: Internal Medicine

## 2023-04-02 ENCOUNTER — Encounter: Payer: Self-pay | Admitting: Internal Medicine

## 2023-04-02 VITALS — BP 110/62 | HR 60 | Ht 68.0 in | Wt 257.5 lb

## 2023-04-02 DIAGNOSIS — R152 Fecal urgency: Secondary | ICD-10-CM

## 2023-04-02 DIAGNOSIS — I4891 Unspecified atrial fibrillation: Secondary | ICD-10-CM

## 2023-04-02 DIAGNOSIS — R194 Change in bowel habit: Secondary | ICD-10-CM

## 2023-04-02 DIAGNOSIS — Z7901 Long term (current) use of anticoagulants: Secondary | ICD-10-CM | POA: Diagnosis not present

## 2023-04-02 DIAGNOSIS — R159 Full incontinence of feces: Secondary | ICD-10-CM

## 2023-04-02 MED ORDER — NA SULFATE-K SULFATE-MG SULF 17.5-3.13-1.6 GM/177ML PO SOLN: 1.0000 | Freq: Once | ORAL | 0 refills | Status: AC

## 2023-04-02 NOTE — Telephone Encounter (Signed)
   SANDOR TAORMINA August 10, 1951 [id number]  Dear Dr Jacinto Halim:  We have scheduled the above named patient for a(n) colonoscopy procedure. Our records show that (s)he is on anticoagulation therapy.  Please advise as to whether the patient may come off their therapy of Eliquis 2 days prior to their procedure which is scheduled for 05/02/2023.  Please route your response to Kesean Serviss [place], CMA or fax response to [phone]  Sincerely,    [facility]

## 2023-04-02 NOTE — Patient Instructions (Addendum)
You have been scheduled for a colonoscopy. Please follow written instructions given to you at your visit today.  Please get your prep kit from your pharmacy with the printed rx we've given you. If you use inhalers (even only as needed), please bring them with you on the day of your procedure.  You will be contaced by our office prior to your procedure for directions on holding your Eliquis.  If you do not hear from our office 1 week prior to your scheduled procedure, please call [phone] to discuss. Ask for PJ, CMA  I appreciate the opportunity to care for you. Stan Head, MD, [facility]

## 2023-04-02 NOTE — Progress Notes (Signed)
GOEFFREY BERCHTOLD 72 y.o. May 18, 1951 [id number]  Assessment & Plan:   Encounter Diagnoses  Name Primary?   Altered bowel habits Yes   Chronic anticoagulation    Atrial fibrillation, unspecified type (HCC)    Incontinence of feces with fecal urgency     Evaluate with colonoscopy.The risks and benefits as well as alternatives of endoscopic procedure(s) have been discussed and reviewed. All questions answered. The patient agrees to proceed.   Hold Eliquis 2 days prior to the procedure.  Clarify with cardiology that this is acceptable.  Rare but real risk of stroke while off Eliquis explained to the patient and he understands and accepts this risk.  Further plans pending the above and clinical course.  I advised him to ask primary care about urology referral  regarding his urinary frequency issues.  CC: [name], MD  Subjective:   Chief Complaint: altered bowel habits  HPI 72 year old white male with a history of atrial fibrillation on Eliquis, history of stroke, history of erosive gastritis and duodenitis who presents for follow-up because of need for screening colonoscopy repeat (last 2014) as well as complaints of loose stools.  He is having frequent loose stools may be better over time he was hospitalized with Pseudomonas urosepsis, has had some falls and is ended up in a nursing home.  He is living there and is improved.  He had some fecal incontinence but none in the last month.  Complaining of urinary frequency as well and nocturia.  Denies any rectal bleeding.  There is some intermittent suprapubic abdominal pain.  Upper GI review of systems negative for dysphagia abdominal pain in the upper abdomen, nausea or vomiting heartburn.  Last seizure more than 3 months ago to the best of his and my knowledge from chart review.  I do not see any evidence of recent seizures.  The patient ambulates with the help of a walker.  He is improved in this regard but still uses a walker  to steady.  His right knee was fused and so that affects his gait.  He is able to transfer to the bed and toilet etc. on his own.      Latest Ref Rng & Units 03/04/2023    3:21 PM 11/09/2022    6:32 AM 11/08/2022    4:09 AM  CBC  WBC 4.0 - 10.5 K/uL 9.6  6.9  9.7   Hemoglobin 13.0 - 17.0 g/dL 40.9  9.7  9.4   Hematocrit 39.0 - 52.0 % 41.2  30.0  29.3   Platelets 150.0 - 400.0 K/uL 299.0  186  206    Lab Results  Component Value Date   FERRITIN 187.6 03/04/2023   Lab Results  Component Value Date   VITAMINB12 227 03/04/2023   Lab Results  Component Value Date   FOLATE 9.1 06/15/2020    EGD 02/27/2016-erosive gastropathy and duodenitis plus deformed bulb thought secondary to Aleve  Colonoscopy 12/11/2012-diverticulosis  Allergies  Allergen Reactions   Peanut-Containing Drug Products Anaphylaxis and Dermatitis   Yellow Jacket Venom [Bee Venom] Anaphylaxis and Other (See Comments)    Respiratory Distress   Celebrex [Celecoxib] Other (See Comments)    BLACK STOOL?MELENA?   Percocet [Oxycodone-Acetaminophen] Hives, Swelling and Other (See Comments)   Latex Rash   Nsaids Rash   Current Meds  Medication Sig   acetaminophen (TYLENOL) 500 MG tablet Take 1,000 mg by mouth 3 (three) times daily.   amitriptyline (ELAVIL) 50 MG tablet Take 0.5 tablets (25 mg total)  by mouth in the morning and at bedtime.   apixaban (ELIQUIS) 5 MG TABS tablet Take 1 tablet (5 mg total) by mouth 2 (two) times daily.   atorvastatin (LIPITOR) 20 MG tablet TAKE ONE TABLET BY MOUTH ONCE DAILY   azelastine (ASTELIN) 0.1 % nasal spray Place 1 spray into both nostrils daily. Use in each nostril as directed   cetirizine (ZYRTEC) 10 MG tablet Take 1 tablet (10 mg total) by mouth daily.   Cholecalciferol (VITAMIN D3) 25 MCG (1000 UT) CAPS Take 1 capsule (1,000 Units total) by mouth daily.   cyanocobalamin (VITAMIN B12) 1000 MCG tablet Take 1 tablet (1,000 mcg total) by mouth daily.   divalproex (DEPAKOTE ER) 500  MG 24 hr tablet TAKE ONE TABLET BY MOUTH EVERYDAY AT BEDTIME   EPINEPHrine 0.3 mg/0.3 mL IJ SOAJ injection Inject 0.3 mg into the muscle as needed for anaphylaxis. (bee stings)   ferrous sulfate 325 (65 FE) MG EC tablet Take 325 mg by mouth daily with breakfast.   furosemide (LASIX) 20 MG tablet Take 1 tablet (20 mg total) by mouth 2 (two) times daily.   gabapentin (NEURONTIN) 300 MG capsule Take 2 capsules (600 mg total) by mouth 3 (three) times daily.   levothyroxine (SYNTHROID) 125 MCG tablet Take 1 tablet (125 mcg total) by mouth daily before breakfast.   Menthol, Topical Analgesic, (BIOFREEZE) 4 % GEL Apply 1 Application topically daily. Neck pain   metoprolol succinate (TOPROL-XL) 100 MG 24 hr tablet Take 1 tablet (100 mg total) by mouth at bedtime. Take with or immediately following a meal.   nitroGLYCERIN (NITROSTAT) 0.4 MG SL tablet Place 1 tablet (0.4 mg total) under the tongue every 5 (five) minutes as needed for chest pain.   ondansetron (ZOFRAN-ODT) 4 MG disintegrating tablet TAKE ONE TABLET BY MOUTH every EIGHT hours AS NEEDED   pantoprazole (PROTONIX) 40 MG tablet Take 1 tablet (40 mg total) by mouth every other day.   tamsulosin (FLOMAX) 0.4 MG CAPS capsule TAKE ONE CAPSULE BY MOUTH ONCE DAILY   tiZANidine (ZANAFLEX) 4 MG tablet TAKE ONE TABLET BY MOUTH every SIX hours AS NEEDED FOR acute HEADACHE   Ubrogepant (UBRELVY) 100 MG TABS Take 100 mg by mouth as needed (take 1 tablet at onset of headache, may repeat in hours, max is 200 mg in 24 hours).   Ubrogepant (UBRELVY) 50 MG TABS Take 1 tablet by mouth daily as needed.   Past Medical History:  Diagnosis Date   Acute kidney failure 08/2008   "cleared up"  no problems since   Acute meningitis 11/06/2022   Allergy    Anxiety    Arthritis    Asthma    ?of this no inhaler   Atrial fibrillation (HCC)    Blood transfusion    CHF (congestive heart failure) (HCC)    ?of this, pt denies   CLOSTRIDIUM DIFFICILE COLITIS 07/04/2010    Annotation: 12/09, 2/10 Qualifier: Diagnosis of  By: [name], [name]     COVID-19 08/12/22 completed Paxlovid 08/20/22 08/26/2022   Depression with anxiety    Duodenitis determined by biopsy 02/2016   peptic likely due to aleve (erosive gastropathy with duodenal erosions)   ECZEMA 07/04/2010   Qualifier: Diagnosis of  By: [name], [name]     Elevated liver enzymes    Fatty liver    Full dentures    GERD (gastroesophageal reflux disease)    Heart attack (HCC)    08/2008 (likely demand ischemia in the setting of  MSSA sepsis/R TKA  infection)10-2008   History of hiatal hernia    History of stomach ulcers    Hypothyroidism    Lower GI bleeding    MALAR AND MAXILLARY BONES CLOSED FRACTURE 07/04/2010   Annotation: ORIF Qualifier: Diagnosis of  By: [name], [name]     Migraine    "definitely"   MRSA (methicillin resistant Staphylococcus aureus)    in leg, had to place steel rod in leg   Personal history of colonic adenoma 06/01/2003   PFO (patent foramen ovale)    small PFO by 08/2008 TEE   Pneumonia 08/2008   "while in ICU"   Pneumonia due to COVID-19 virus 08/12/2022   Seizures (HCC) 2009   "long time ago"     Sleep apnea    does not wear CPAP   Stroke (HCC) 2010   unable to complete sentences at times   Wears glasses    Past Surgical History:  Procedure Laterality Date   AMPUTATION Left 01/24/2022   Procedure: LEFT GREAT TOE AMPUTATION AT METATARSOPHALANGEAL JOINT AND SECOND TOE AMPUTATION;  Surgeon: [name], MD;  Location: [facility] OR;  Service: Orthopedics;  Laterality: Left;   anterior  nerve transposition  07/2009   left ulnar nerve   antibiotic spacer exchange  11/2008; 08/2006   right knee   ARTHROTOMY  08/2008   right knee w/I&D   CARDIOVASCULAR STRESS TEST  2013   stress EKG - negative for ischemia, 4 min 7.3 METs, normal blood pressure response   CARDIOVERSION N/A 08/24/2022   Procedure: CARDIOVERSION;  Surgeon: Antonieta Iba, MD;  Location: [facility] ORS;   Service: Cardiovascular;  Laterality: N/A;   CATARACT EXTRACTION     COLONOSCOPY  11/2012   diverticulosis, rpt 10 yrs [name])   ESOPHAGOGASTRODUODENOSCOPY  02/2016   erosive gastropathy with duodenal erosions [name])   HARDWARE REMOVAL  04/2006   right knee w/antibiotic spacers placed   INGUINAL HERNIA REPAIR  early 1990's   bilateral   JOINT REPLACEMENT Bilateral    KNEE ARTHROSCOPY  10/2001   right   KNEE FUSION  03/2009   right knee removal; antibiotic spacers;    LEFT HEART CATH AND CORONARY ANGIOGRAPHY N/A 02/04/2018    WNL (Patwardhan, [name], MD)   LUMBAR LAMINECTOMY/DECOMPRESSION MICRODISCECTOMY N/A 12/05/2018   Procedure: LUMBAR THREE TO FOUR AND LUMBAR FOUR TO FIVE DECOMPRESSION;  Surgeon: [name], MD;  Location: [facility] OR;  Service: Orthopedics;  Laterality: N/A;   MULTIPLE TOOTH EXTRACTIONS     REPLACEMENT TOTAL KNEE  08/2008; 09/2001   right   REVERSE SHOULDER ARTHROPLASTY Left 09/03/2017   Procedure: REVERSE SHOULDER ARTHROPLASTY;  Surgeon: [name], MD;  Location: [facility] OR;  Service: Orthopedics;  Laterality: Left;   REVERSE SHOULDER ARTHROPLASTY Right 05/13/2018   Procedure: RIGHT REVERSE SHOULDER ARTHROPLASTY;  Surgeon: [name], MD;  Location: [facility] OR;  Service: Orthopedics;  Laterality: Right;   rod placement Right 03/2009   R knee   SYNOVECTOMY  06/2005   debridement, liner exchange right knee   TEE WITHOUT CARDIOVERSION N/A 08/24/2022   Procedure: TRANSESOPHAGEAL ECHOCARDIOGRAM (TEE);  Surgeon: Antonieta Iba, MD;  Location: [facility] ORS;  Service: Cardiovascular;  Laterality: N/A;   TOE AMPUTATION Left 08/2008   great toe - osteomyelitis (staph infection)   TOE AMPUTATION Left 04/2016   2nd toe August Saucer)   TONSILLECTOMY     TOTAL KNEE ARTHROPLASTY Left 01/15/2017   TOTAL KNEE ARTHROPLASTY Left 01/15/2017   Procedure: TOTAL  KNEE ARTHROPLASTY;  Surgeon: [name], MD;  Location: [facility] OR;  Service: Orthopedics;  Laterality: Left;   Social  History   Social History Narrative   Widower 2016 - wife passed from ESRD related illness   Lives in [facility] in [place].    M[employer]on      Screened positive for OSA 04/2018   family history includes Breast cancer in his sister; CAD (age of onset: 49) in his mother; Coronary artery disease (age of onset: 58) in his father; Diabetes in his brother and sister; Stroke in his father.   Review of Systems  Urinary frequency and nocturia, migraines otherwise as per HPI Objective:   Physical Exam @BP  110/62 (BP Location: Left Arm, Patient Position: Sitting, Cuff Size: Normal)   Pulse 60   Ht 5\' 8"  (1.727 m)   Wt 257 lb 8 oz (116.8 kg)   BMI 39.15 kg/m @  General:  Well-developed, well-nourished and in no acute distress Eyes:  anicteric. ENT:   Mouth and posterior pharynx free of lesions. + dentures upper and lower.  Lungs: Clear to auscultation bilaterally. Heart:   S1S2, no rubs, murmurs, gallops. NL rhythm Abdomen:  soft, non-tender, no hepatosplenomegaly, hernia, or mass and BS+.  Rectal: deferred Lymph:  no cervical or supraclavicular adenopathy. Extremities:   Tr ankle edema, R knee is fused -  Skin   no rash. Neuro:  A&O x 3. Able to ambulate - needs steadying, did get on exam table Psych:  appropriate mood and  Affect.   Data Reviewed: See HPI

## 2023-04-02 NOTE — Telephone Encounter (Signed)
Hi Tim, I think he follows with you

## 2023-04-04 DIAGNOSIS — M79672 Pain in left foot: Secondary | ICD-10-CM | POA: Diagnosis not present

## 2023-04-05 DIAGNOSIS — I10 Essential (primary) hypertension: Secondary | ICD-10-CM | POA: Diagnosis not present

## 2023-04-08 DIAGNOSIS — R194 Change in bowel habit: Secondary | ICD-10-CM | POA: Diagnosis not present

## 2023-04-08 DIAGNOSIS — R1310 Dysphagia, unspecified: Secondary | ICD-10-CM | POA: Diagnosis not present

## 2023-04-08 DIAGNOSIS — D649 Anemia, unspecified: Secondary | ICD-10-CM | POA: Diagnosis not present

## 2023-04-08 DIAGNOSIS — R262 Difficulty in walking, not elsewhere classified: Secondary | ICD-10-CM | POA: Diagnosis not present

## 2023-04-08 DIAGNOSIS — I5032 Chronic diastolic (congestive) heart failure: Secondary | ICD-10-CM | POA: Diagnosis not present

## 2023-04-08 DIAGNOSIS — I4891 Unspecified atrial fibrillation: Secondary | ICD-10-CM | POA: Diagnosis not present

## 2023-04-08 DIAGNOSIS — Z9181 History of falling: Secondary | ICD-10-CM | POA: Diagnosis not present

## 2023-04-08 DIAGNOSIS — Z7901 Long term (current) use of anticoagulants: Secondary | ICD-10-CM | POA: Diagnosis not present

## 2023-04-08 DIAGNOSIS — Z789 Other specified health status: Secondary | ICD-10-CM | POA: Diagnosis not present

## 2023-04-08 DIAGNOSIS — Z8619 Personal history of other infectious and parasitic diseases: Secondary | ICD-10-CM | POA: Diagnosis not present

## 2023-04-08 DIAGNOSIS — I10 Essential (primary) hypertension: Secondary | ICD-10-CM | POA: Diagnosis not present

## 2023-04-08 DIAGNOSIS — R519 Headache, unspecified: Secondary | ICD-10-CM | POA: Diagnosis not present

## 2023-04-08 DIAGNOSIS — E039 Hypothyroidism, unspecified: Secondary | ICD-10-CM | POA: Diagnosis not present

## 2023-04-08 DIAGNOSIS — M7989 Other specified soft tissue disorders: Secondary | ICD-10-CM | POA: Diagnosis not present

## 2023-04-08 DIAGNOSIS — Z89422 Acquired absence of other left toe(s): Secondary | ICD-10-CM | POA: Diagnosis not present

## 2023-04-08 DIAGNOSIS — Z87440 Personal history of urinary (tract) infections: Secondary | ICD-10-CM | POA: Diagnosis not present

## 2023-04-15 ENCOUNTER — Ambulatory Visit (INDEPENDENT_AMBULATORY_CARE_PROVIDER_SITE_OTHER): Payer: 59 | Admitting: Podiatry

## 2023-04-15 ENCOUNTER — Encounter: Payer: Self-pay | Admitting: Podiatry

## 2023-04-15 DIAGNOSIS — B351 Tinea unguium: Secondary | ICD-10-CM | POA: Diagnosis not present

## 2023-04-15 DIAGNOSIS — M79674 Pain in right toe(s): Secondary | ICD-10-CM | POA: Diagnosis not present

## 2023-04-15 DIAGNOSIS — M79675 Pain in left toe(s): Secondary | ICD-10-CM

## 2023-04-15 DIAGNOSIS — D689 Coagulation defect, unspecified: Secondary | ICD-10-CM

## 2023-04-15 NOTE — Progress Notes (Signed)
Subjective:  Patient ID: Nicholas [name], male    DOB: 1950/11/01,   MRN: [id number]  Chief Complaint  Patient presents with   Nail Problem     Routine foot care   Callouses     Left foot callus trim     72 y.o. male presents for concern of thickened elongated and painful nails that are difficult to trim. Requesting to have them trimmed today. Relates burning and tingling in their feet. Patient relates he was recently diangosed with diabetes and unclear from chart. No recent A1c. He is on eliquis and at risk for routine foot care  PCP:  [name], MD    . Denies any other pedal complaints. Denies n/v/f/c.   Past Medical History:  Diagnosis Date   Acute kidney failure 08/2008   "cleared up"  no problems since   Acute meningitis 11/06/2022   Allergy    Anxiety    Arthritis    Asthma    ?of this no inhaler   Atrial fibrillation (HCC)    Blood transfusion    CHF (congestive heart failure) (HCC)    ?of this, pt denies   CLOSTRIDIUM DIFFICILE COLITIS 07/04/2010   Annotation: 12/09, 2/10 Qualifier: Diagnosis of  By: [name], [name]     COVID-19 08/12/22 completed Paxlovid 08/20/22 08/26/2022   Depression with anxiety    Duodenitis determined by biopsy 02/2016   peptic likely due to aleve (erosive gastropathy with duodenal erosions)   ECZEMA 07/04/2010   Qualifier: Diagnosis of  By: [name], [name]     Elevated liver enzymes    Fatty liver    Full dentures    GERD (gastroesophageal reflux disease)    Heart attack (HCC)    08/2008 (likely demand ischemia in the setting of MSSA sepsis/R TKA  infection)10-2008   History of hiatal hernia    History of stomach ulcers    Hypothyroidism    Lower GI bleeding    MALAR AND MAXILLARY BONES CLOSED FRACTURE 07/04/2010   Annotation: ORIF Qualifier: Diagnosis of  By: [name], [name]     Migraine    "definitely"   MRSA (methicillin resistant Staphylococcus aureus)    in leg, had to place steel rod in leg   Personal history  of colonic adenoma 06/01/2003   PFO (patent foramen ovale)    small PFO by 08/2008 TEE   Pneumonia 08/2008   "while in ICU"   Pneumonia due to COVID-19 virus 08/12/2022   Seizures (HCC) 2009   "long time ago"     Sleep apnea    does not wear CPAP   Stroke (HCC) 2010   unable to complete sentences at times   Wears glasses     Objective:  Physical Exam: Vascular: DP/PT pulses 2/4 bilateral. CFT <3 seconds. Absent hair growth on digits. Edema noted to bilateral lower extremities. Xerosis noted bilaterally.  Skin. No lacerations or abrasions bilateral feet. Nails 3-5 bilateral  and 1,2 on right are thickened discolored and elongated with subungual debris.  Musculoskeletal: MMT 5/5 bilateral lower extremities in DF, PF, Inversion and Eversion. Deceased ROM in DF of ankle joint. Left hallux and second digit amputated.  Neurological: Sensation intact to light touch. Protective sensation diminished bilateral.    Assessment:   1. Pain due to onychomycosis of toenails of both feet   2. Coagulation defect (HCC)      Plan:  Patient was evaluated and treated and all questions answered. -Discussed and educated patient on diabetic foot  care, especially with  regards to the vascular, neurological and musculoskeletal systems.  -Stressed the importance of good glycemic control and the detriment of not  controlling glucose levels in relation to the foot. -Discussed supportive shoes at all times and checking feet regularly.  -Mechanically debrided all nails 1-5 bilateral using sterile nail nipper and filed with dremel without incident  -Answered all patient questions -Patient to return  in 3 months for at risk foot care -Patient advised to call the office if any problems or questions arise in the meantime.   Louann Sjogren, DPM

## 2023-04-15 NOTE — Telephone Encounter (Signed)
Patient has appointment 04/30/2023 at 9:15 AM.

## 2023-04-16 ENCOUNTER — Encounter: Payer: Self-pay | Admitting: Internal Medicine

## 2023-04-17 ENCOUNTER — Ambulatory Visit: Payer: 59 | Admitting: *Deleted

## 2023-04-17 NOTE — Chronic Care Management (AMB) (Signed)
   04/17/2023  [name] 04-02-1951 [id number]    CCM RN Visit Note   04/17/23 Name: [name] MRN: [id number]      DOB: 06/08/1951  Subjective: Nicholas [name] is a 72 y.o. year old male who is a primary care patient of [name] MD. The patient was referred to the Chronic Care Management team for assistance with care management needs subsequent to provider initiation of CCM services and plan of care.      Third unsuccessful telephone outreach was attempted today to contact the patient about Chronic Care Management needs.The patient was referred to the case management team by the provider who initiated CCM services. The provider has been notified of our unsuccessful attempts to make or maintain contact with the patient.   Plan:No further follow up required: case closure due to unable to maintain contact.  Irving Shows [facility], BSN RN Case Manager [facility] [place] [phone]

## 2023-04-22 DIAGNOSIS — M6281 Muscle weakness (generalized): Secondary | ICD-10-CM | POA: Diagnosis not present

## 2023-04-22 DIAGNOSIS — R300 Dysuria: Secondary | ICD-10-CM | POA: Diagnosis not present

## 2023-04-22 DIAGNOSIS — N390 Urinary tract infection, site not specified: Secondary | ICD-10-CM | POA: Diagnosis not present

## 2023-04-22 DIAGNOSIS — R262 Difficulty in walking, not elsewhere classified: Secondary | ICD-10-CM | POA: Diagnosis not present

## 2023-04-22 DIAGNOSIS — L97523 Non-pressure chronic ulcer of other part of left foot with necrosis of muscle: Secondary | ICD-10-CM | POA: Diagnosis not present

## 2023-04-22 DIAGNOSIS — M19011 Primary osteoarthritis, right shoulder: Secondary | ICD-10-CM | POA: Diagnosis not present

## 2023-04-22 DIAGNOSIS — I5023 Acute on chronic systolic (congestive) heart failure: Secondary | ICD-10-CM | POA: Diagnosis not present

## 2023-04-24 ENCOUNTER — Encounter: Payer: 59 | Admitting: Internal Medicine

## 2023-04-24 DIAGNOSIS — N390 Urinary tract infection, site not specified: Secondary | ICD-10-CM | POA: Diagnosis not present

## 2023-04-24 DIAGNOSIS — A499 Bacterial infection, unspecified: Secondary | ICD-10-CM | POA: Diagnosis not present

## 2023-04-29 NOTE — Progress Notes (Unsigned)
Cardiology Office Note:    Date:  04/30/2023   ID:  [name], DOB 21-Jun-1951, MRN [id number]  PCP:  Karna Dupes, MD  [facility] Cardiologist:  [name], MD  [facility] Electrophysiologist:  None   Referring MD: [name], MD   Chief Complaint: Overdue follow-up/hospital follow-up/pre-op evaluation  History of Present Illness:    Nicholas [name] is a 72 y.o. male with a hx of stroke, MI with nonobstructive CAD, Afib on Eliquis, CHF, gastric ulcers, and OSA who is being seen for overdue follow-up.   LHC in 2019 showed no significant CAD.   He was hospitalized in 08/2022 for a fall, found to have rapid afib and elevated troponin in the setting of MSSA bacteremia, COVID. [name] CHADSVASC at least 3-4 and he was started on IV heparin. He underwent successful TEE/cardioversion. He was later transitioned to Eliquis. Troponin elevation was felt to be due to supply demand mismatch.Echo showed LVEF 55-60%, normal RVSF, moderate MR.  He was sent home on Toprol and Eliquis. Since then he has had multiple ER visits and readmissions.   Today, the patient has a colonoscopy later this week. Colonoscopy is routine He is needing clearance for this. He says he was recently diagnosed with diabetes by doctors at [name]. This is the second time in SNF; he kept falling and son placed him back into a SNF.  He has h/o Afib on Eliquis. He has a h/o stroke. He will likely need bridging with Lovenox. There was no request made to pre-op pool upon chart review, therefore no pharmacy recommendations.    The patient denies chest pain, shortness of breath. He has chronic lower leg edema. He takes lasix 20mg  BID for swelling. No orthopnea or pnd. No palpitaions. No lightheadedness or dizziness. He uses a walker. He can perform all ADLs. Cannot walk 1-2 blocks without stopping.   Past Medical History:  Diagnosis Date   Acute kidney failure 08/2008   "cleared up"  no problems since   Acute  meningitis 11/06/2022   Allergy    Anxiety    Arthritis    Asthma    ?of this no inhaler   Atrial fibrillation (HCC)    Blood transfusion    CHF (congestive heart failure) (HCC)    ?of this, pt denies   CLOSTRIDIUM DIFFICILE COLITIS 07/04/2010   Annotation: 12/09, 2/10 Qualifier: Diagnosis of  By: [name], [name]     COVID-19 08/12/22 completed Paxlovid 08/20/22 08/26/2022   Depression with anxiety    Duodenitis determined by biopsy 02/2016   peptic likely due to aleve (erosive gastropathy with duodenal erosions)   ECZEMA 07/04/2010   Qualifier: Diagnosis of  By: [name], [name]     Elevated liver enzymes    Fatty liver    Full dentures    GERD (gastroesophageal reflux disease)    Heart attack (HCC)    08/2008 (likely demand ischemia in the setting of MSSA sepsis/R TKA  infection)10-2008   History of hiatal hernia    History of stomach ulcers    Hypothyroidism    Lower GI bleeding    MALAR AND MAXILLARY BONES CLOSED FRACTURE 07/04/2010   Annotation: ORIF Qualifier: Diagnosis of  By: [name], [name]     Migraine    "definitely"   MRSA (methicillin resistant Staphylococcus aureus)    in leg, had to place steel rod in leg   Personal history of colonic adenoma 06/01/2003   PFO (patent foramen ovale)  small PFO by 08/2008 TEE   Pneumonia 08/2008   "while in ICU"   Pneumonia due to COVID-19 virus 08/12/2022   Seizures (HCC) 2009   "long time ago"     Sleep apnea    does not wear CPAP   Stroke (HCC) 2010   unable to complete sentences at times   Wears glasses     Past Surgical History:  Procedure Laterality Date   AMPUTATION Left 01/24/2022   Procedure: LEFT GREAT TOE AMPUTATION AT METATARSOPHALANGEAL JOINT AND SECOND TOE AMPUTATION;  Surgeon: [name], MD;  Location: [facility] OR;  Service: Orthopedics;  Laterality: Left;   anterior  nerve transposition  07/2009   left ulnar nerve   antibiotic spacer exchange  11/2008; 08/2006   right knee   ARTHROTOMY  08/2008    right knee w/I&D   CARDIOVASCULAR STRESS TEST  2013   stress EKG - negative for ischemia, 4 min 7.3 METs, normal blood pressure response   CARDIOVERSION N/A 08/24/2022   Procedure: CARDIOVERSION;  Surgeon: Antonieta Iba, MD;  Location: [facility] ORS;  Service: Cardiovascular;  Laterality: N/A;   CATARACT EXTRACTION     COLONOSCOPY  11/2012   diverticulosis, rpt 10 yrs [name])   ESOPHAGOGASTRODUODENOSCOPY  02/2016   erosive gastropathy with duodenal erosions [name])   HARDWARE REMOVAL  04/2006   right knee w/antibiotic spacers placed   INGUINAL HERNIA REPAIR  early 1990's   bilateral   JOINT REPLACEMENT Bilateral    KNEE ARTHROSCOPY  10/2001   right   KNEE FUSION  03/2009   right knee removal; antibiotic spacers;    LEFT HEART CATH AND CORONARY ANGIOGRAPHY N/A 02/04/2018    WNL (Patwardhan, [name], MD)   LUMBAR LAMINECTOMY/DECOMPRESSION MICRODISCECTOMY N/A 12/05/2018   Procedure: LUMBAR THREE TO FOUR AND LUMBAR FOUR TO FIVE DECOMPRESSION;  Surgeon: [name], MD;  Location: [facility] OR;  Service: Orthopedics;  Laterality: N/A;   MULTIPLE TOOTH EXTRACTIONS     REPLACEMENT TOTAL KNEE  08/2008; 09/2001   right   REVERSE SHOULDER ARTHROPLASTY Left 09/03/2017   Procedure: REVERSE SHOULDER ARTHROPLASTY;  Surgeon: [name], MD;  Location: [facility] OR;  Service: Orthopedics;  Laterality: Left;   REVERSE SHOULDER ARTHROPLASTY Right 05/13/2018   Procedure: RIGHT REVERSE SHOULDER ARTHROPLASTY;  Surgeon: [name], MD;  Location: [facility] OR;  Service: Orthopedics;  Laterality: Right;   rod placement Right 03/2009   R knee   SYNOVECTOMY  06/2005   debridement, liner exchange right knee   TEE WITHOUT CARDIOVERSION N/A 08/24/2022   Procedure: TRANSESOPHAGEAL ECHOCARDIOGRAM (TEE);  Surgeon: Antonieta Iba, MD;  Location: [facility] ORS;  Service: Cardiovascular;  Laterality: N/A;   TOE AMPUTATION Left 08/2008   great toe - osteomyelitis (staph infection)   TOE AMPUTATION Left 04/2016   2nd  toe August Saucer)   TONSILLECTOMY     TOTAL KNEE ARTHROPLASTY Left 01/15/2017   TOTAL KNEE ARTHROPLASTY Left 01/15/2017   Procedure: TOTAL KNEE ARTHROPLASTY;  Surgeon: [name], MD;  Location: [facility] OR;  Service: Orthopedics;  Laterality: Left;    Current Medications: Current Meds  Medication Sig   acetaminophen (TYLENOL) 500 MG tablet Take 1,000 mg by mouth 3 (three) times daily.   amitriptyline (ELAVIL) 50 MG tablet Take 0.5 tablets (25 mg total) by mouth in the morning and at bedtime.   apixaban (ELIQUIS) 5 MG TABS tablet Take 1 tablet (5 mg total) by mouth 2 (two) times daily.   atorvastatin (LIPITOR) 20 MG tablet TAKE  ONE TABLET BY MOUTH ONCE DAILY   azelastine (ASTELIN) 0.1 % nasal spray Place 2 sprays into both nostrils daily. Use in each nostril as directed   cetirizine (ZYRTEC) 10 MG tablet Take 1 tablet (10 mg total) by mouth daily.   Cholecalciferol (VITAMIN D3) 25 MCG (1000 UT) CAPS Take 1 capsule (1,000 Units total) by mouth daily.   cyanocobalamin (VITAMIN B12) 1000 MCG tablet Take 1 tablet (1,000 mcg total) by mouth daily.   divalproex (DEPAKOTE ER) 500 MG 24 hr tablet TAKE ONE TABLET BY MOUTH EVERYDAY AT BEDTIME   EPINEPHrine 0.3 mg/0.3 mL IJ SOAJ injection Inject 0.3 mg into the muscle as needed for anaphylaxis. (bee stings)   furosemide (LASIX) 20 MG tablet Take 1 tablet (20 mg total) by mouth 2 (two) times daily.   gabapentin (NEURONTIN) 300 MG capsule Take 600 mg by mouth 3 (three) times daily. 300 mg in the am, 600 mg in the afternoon, and 600 mg in the evening   levothyroxine (SYNTHROID) 125 MCG tablet Take 1 tablet (125 mcg total) by mouth daily before breakfast.   Menthol, Topical Analgesic, (BIOFREEZE) 4 % GEL Apply 1 Application topically daily. Neck pain   metoprolol succinate (TOPROL-XL) 100 MG 24 hr tablet Take 1 tablet (100 mg total) by mouth at bedtime. Take with or immediately following a meal.   nitroGLYCERIN (NITROSTAT) 0.4 MG SL tablet Place 1 tablet (0.4 mg  total) under the tongue every 5 (five) minutes as needed for chest pain.   ondansetron (ZOFRAN-ODT) 4 MG disintegrating tablet TAKE ONE TABLET BY MOUTH every EIGHT hours AS NEEDED   pantoprazole (PROTONIX) 40 MG tablet Take 1 tablet (40 mg total) by mouth every other day.   Probiotic Product (PROBIOTIC DAILY) CAPS Take 1 capsule by mouth. 1 capsule in the morning every 3 days   tamsulosin (FLOMAX) 0.4 MG CAPS capsule TAKE ONE CAPSULE BY MOUTH ONCE DAILY   tiZANidine (ZANAFLEX) 4 MG tablet TAKE ONE TABLET BY MOUTH every SIX hours AS NEEDED FOR acute HEADACHE   Ubrogepant (UBRELVY) 100 MG TABS Take 100 mg by mouth as needed (take 1 tablet at onset of headache, may repeat in hours, max is 200 mg in 24 hours).   Ubrogepant (UBRELVY) 50 MG TABS Take 1 tablet by mouth daily as needed.     Allergies:   Peanut-containing drug products, Yellow jacket venom [bee venom], Celebrex [celecoxib], Percocet [oxycodone-acetaminophen], Latex, and Nsaids   Social History   Socioeconomic History   Marital status: Widowed    Spouse name: Not on file   Number of children: 2   Years of education: 67   Highest education level: Not on file  Occupational History   Occupation: retired  Tobacco Use   Smoking status: Never    Passive exposure: Past   Smokeless tobacco: Never  Vaping Use   Vaping status: Never Used  Substance and Sexual Activity   Alcohol use: No    Alcohol/week: 0.0 standard drinks of alcohol    Comment: "I abused alcohol; last drink  ~ 2005"   Drug use: No   Sexual activity: Not Currently  Other Topics Concern   Not on file  Social History Narrative   Widower 2016 - wife passed from ESRD related illness   Lives in [facility] in [place].    Mormon      Screened positive for OSA 04/2018   Social Determinants of Health   Financial Resource Strain: Low Risk  (12/11/2022)   Overall  Financial Resource Strain (CARDIA)    Difficulty of Paying Living Expenses: Not hard at all   Food Insecurity: No Food Insecurity (12/11/2022)   Hunger Vital Sign    Worried About Running Out of Food in the Last Year: Never true    Ran Out of Food in the Last Year: Never true  Transportation Needs: No Transportation Needs (12/11/2022)   PRAPARE - Administrator, Civil Service (Medical): No    Lack of Transportation (Non-Medical): No  Recent Concern: Transportation Needs - Unmet Transportation Needs (11/07/2022)   PRAPARE - Transportation    Lack of Transportation (Medical): Yes    Lack of Transportation (Non-Medical): Yes  Physical Activity: Insufficiently Active (12/11/2022)   Exercise Vital Sign    Days of Exercise per Week: 3 days    Minutes of Exercise per Session: 20 min  Stress: No Stress Concern Present (12/11/2022)   [employer] of Occupational Health - Occupational Stress Questionnaire    Feeling of Stress : Not at all  Social Connections: Moderately Isolated (12/11/2022)   Social Connection and Isolation Panel [NHANES]    Frequency of Communication with Friends and Family: More than three times a week    Frequency of Social Gatherings with Friends and Family: Once a week    Attends Religious Services: More than 4 times per year    Active Member of [employer] or Organizations: No    Attends Banker Meetings: Never    Marital Status: Widowed     Family History: The patient's family history includes Breast cancer in his sister; CAD (age of onset: 26) in his mother; Coronary artery disease (age of onset: 64) in his father; Diabetes in his brother and sister; Stroke in his father. There is no history of Colon cancer or Stomach cancer.  ROS:   Please see the history of present illness.     All other systems reviewed and are negative.  EKGs/Labs/Other Studies Reviewed:    The following studies were reviewed today:  Echo 08/2022  1. Left ventricular ejection fraction, by estimation, is 55 to 60%. The  left ventricle has normal function. Left  ventricular endocardial border  not optimally defined to evaluate regional wall motion. Left ventricular  diastolic parameters are  indeterminate.   2. Right ventricular systolic function is normal. The right ventricular  size is normal.   3. Left atrial size was mildly dilated.   4. The mitral valve is normal in structure. Moderate mitral valve  regurgitation. No evidence of mitral stenosis.   5. The aortic valve is normal in structure. Aortic valve regurgitation is  mild. No aortic stenosis is present.   6. Aortic dilatation noted. There is mild dilatation of the ascending  aorta, measuring 42 mm.   LHC 2019 Normal coronary arteries without any significant coronary artery disease Normal LVEDP Normal LVEF   Chest pain likely noncardiac in origin. Proceed with shoulder surgery with low cardiac risk.   Elder Negus, MD [facility]. [place] Pager: [phone] Office: [phone] If no answer Cell [phone]   EKG:  EKG is ordered today.  The ekg ordered today demonstrates SB, 51bpm, LAD, nonspecific T wave changes  Recent Labs: 11/07/2022: B Natriuretic Peptide 122.5 11/09/2022: Magnesium 1.9 03/04/2023: ALT 14; BUN 17; Creatinine, Ser 1.35; Hemoglobin 13.6; Platelets 299.0; Potassium 4.1; Sodium 138; TSH 5.91  Recent Lipid Panel    Component Value Date/Time   CHOL 135 03/04/2023 1521   TRIG 152.0 (H) 03/04/2023 1521   HDL  40.30 03/04/2023 1521   CHOLHDL 3 03/04/2023 1521   VLDL 30.4 03/04/2023 1521   LDLCALC 64 03/04/2023 1521   LDLDIRECT 60.0 12/11/2019 1005     Physical Exam:    VS:  BP 118/70 (BP Location: Left Arm, Patient Position: Sitting, Cuff Size: Normal)   Pulse (!) 51   Ht 5\' 8"  (1.727 m)   Wt 258 lb (117 kg)   SpO2 96%   BMI 39.23 kg/m     Wt Readings from Last 3 Encounters:  04/30/23 258 lb (117 kg)  04/02/23 257 lb 8 oz (116.8 kg)  03/08/23 248 lb 6 oz (112.7 kg)     GEN:  Well nourished, well developed in no acute  distress HEENT: Normal NECK: No JVD; No carotid bruits LYMPHATICS: No lymphadenopathy CARDIAC: Bradycardia, RR, no murmurs, rubs, gallops RESPIRATORY:  Clear to auscultation without rales, wheezing or rhonchi  ABDOMEN: Soft, non-tender, non-distended MUSCULOSKELETAL:  No edema; No deformity  SKIN: Warm and dry NEUROLOGIC:  Alert and oriented x 3 PSYCHIATRIC:  Normal affect   ASSESSMENT:    1. Pre-operative cardiovascular examination   2. Paroxysmal atrial fibrillation (HCC)    PLAN:    In order of problems listed above:  Pre-op evaluation for routine colonoscopy Paroxysmal Afib Patient presents for preoperative cardiac evaluation prior to routine colonoscopy.  It is scheduled for later this week.  Patient has a history of paroxysmal A-fib previously treated with TEE cardioversion in November 2023 during admission with bacteremia and COVID-19.  Unfortunately, we did not see patient in follow-up after discharge.  Patient has a CHA2DS2-VASc of 53 (age, CHF, stroke x2).  He has been maintained on Eliquis and Toprol.  EKG today shows sinus bradycardia with a heart rate of 51 bpm.  From a cardiac perspective, patient is overall stable with no chest pain, shortness of breath, worsening lower leg edema.  Prior echo showed normal LVEF, moderate MR, mild dilation of the ascending aorta measuring 42 mm.  Patient takes Lasix for volume management.  He appears euvolemic on exam today.  Upon chart review, no official request was sent to preop pool.  Given history of stroke, patient may require bridging with Lovenox.  Message has been sent to pharmacy for further recommendations regarding Lovenox bridging.  METs are less than 4.  According to RCRI patient moderate risk, 10.1% 30-day risk of death, MI, or cardiac arrest.  Recommend postponing colonoscopy until pharmacy recommendations come through.  I will check a CBC and a c-Met today.  Disposition: Follow up in 3 month(s) with MD/APP    Signed, Lawan Nanez  David Stall, PA-C  04/30/2023 9:54 AM    [facility]

## 2023-04-30 ENCOUNTER — Ambulatory Visit: Payer: 59 | Attending: Medical | Admitting: Medical

## 2023-04-30 ENCOUNTER — Other Ambulatory Visit
Admission: RE | Admit: 2023-04-30 | Discharge: 2023-04-30 | Disposition: A | Discharge status: Home / Self Care | Payer: 59 | Source: Ambulatory Visit | Attending: Medical | Admitting: Medical

## 2023-04-30 ENCOUNTER — Encounter: Payer: Self-pay | Admitting: Medical

## 2023-04-30 VITALS — BP 118/70 | HR 51 | Ht 68.0 in | Wt 258.0 lb

## 2023-04-30 DIAGNOSIS — I480 Paroxysmal atrial fibrillation: Secondary | ICD-10-CM | POA: Insufficient documentation

## 2023-04-30 DIAGNOSIS — Z01810 Encounter for preprocedural cardiovascular examination: Secondary | ICD-10-CM | POA: Diagnosis not present

## 2023-04-30 LAB — BASIC METABOLIC PANEL
Anion gap: 9 (ref 5–15)
BUN: 21 mg/dL (ref 8–23)
CO2: 29 mmol/L (ref 22–32)
Calcium: 8.8 mg/dL — ABNORMAL LOW (ref 8.9–10.3)
Chloride: 101 mmol/L (ref 98–111)
Creatinine, Ser: 1.23 mg/dL (ref 0.61–1.24)
GFR, Estimated: >60 mL/min (ref >60)
Glucose, Bld: 94 mg/dL (ref 70–99)
Potassium: 4.9 mmol/L (ref 3.5–5.1)
Sodium: 139 mmol/L (ref 135–145)

## 2023-04-30 LAB — CBC
HCT: 44.2 % (ref 39.0–52.0)
Hemoglobin: 14.4 g/dL (ref 13.0–17.0)
MCH: 30.7 pg (ref 26.0–34.0)
MCHC: 32.6 g/dL (ref 30.0–36.0)
MCV: 94.2 fL (ref 80.0–100.0)
Platelets: 184 10*3/uL (ref 150–400)
RBC: 4.69 MIL/uL (ref 4.22–5.81)
RDW: 13.4 % (ref 11.5–15.5)
WBC: 7.2 10*3/uL (ref 4.0–10.5)
nRBC: 0 % (ref 0.0–0.2)

## 2023-04-30 NOTE — Patient Instructions (Signed)
Medication Instructions:  Your physician recommends that you continue on your current medications as directed. Please refer to the Current Medication list given to you today.  We will call you regarding medication instructions prior to colonoscopy. In the meantime, Cadence recommends rescheduling.   *If you need a refill on your cardiac medications before your next appointment, please call your pharmacy*   Lab Work: Your provider would like for you to have following labs drawn: (CBC, BMP).   Please go to the [facility] entrance and check in at the front desk.  You do not need an appointment.  They are open from 7am-6 pm.     Testing/Procedures: None ordered today   Follow-Up: At [facility], you and your health needs are our priority.  As part of our continuing mission to provide you with exceptional heart care, we have created designated Provider Care Teams.  These Care Teams include your primary Cardiologist (physician) and Advanced Practice Providers (APPs -  Physician Assistants and Nurse Practitioners) who all work together to provide you with the care you need, when you need it.  We recommend signing up for the patient portal called "MyChart".  Sign up information is provided on this After Visit Summary.  MyChart is used to connect with patients for Virtual Visits (Telemedicine).  Patients are able to view lab/test results, encounter notes, upcoming appointments, etc.  Non-urgent messages can be sent to your provider as well.   To learn more about what you can do with MyChart, go to [email or web address].    Your next appointment:   3 month(s)  Provider:   You may see [name], MD or one of the following Advanced Practice Providers on your designated Care Team:   Nicolasa Ducking, NP Eula Listen, PA-C Cadence Fransico Michael, PA-C Charlsie Quest, NP

## 2023-04-30 NOTE — Telephone Encounter (Signed)
Patient with diagnosis of afib on Eliquis for anticoagulation.    Procedure: colonoscopy Date of procedure: 05/02/23  CHA2DS2-VASc Score = 6  This indicates a 9.7% annual risk of stroke. The patient's score is based upon: CHF History: 1 HTN History: 0 Diabetes History: 1 Stroke History: 2 Vascular Disease History: 1 Age Score: 1 Gender Score: 0   Stroke noted in 2010.  CrCl 57mL/min using adjusted body weight Platelet count 184K  Would see if office performing procedure is ok with 1 day Eliquis hold prior to procedure given history of CVA. If 2 day hold is mandated, would be ok. Current guidelines would not recommend bridging for short DOAC hold with remote history of CVA.  **This guidance is not considered finalized until pre-operative APP has relayed final recommendations.**

## 2023-05-01 ENCOUNTER — Encounter: Payer: Self-pay | Admitting: Certified Registered Nurse Anesthetist

## 2023-05-01 ENCOUNTER — Telehealth: Payer: Self-pay

## 2023-05-01 MED ORDER — NA SULFATE-K SULFATE-MG SULF 17.5-3.13-1.6 GM/177ML PO SOLN: 1.0000 | Freq: Once | ORAL | 0 refills | Status: AC

## 2023-05-01 NOTE — Telephone Encounter (Signed)
I spoke with Mr Schappert and he had eaten today so we have r/s'ed his colonoscopy to 06/13/2023 at 10:30am. I called and spoke to his nurse at [facility]  [phone]) where he lives. I have done new instructions and confirmed his address. Mailing 2 copies. One for him, one for the RN. Along with a printed rx for the suprep kit.  I included in the instructions for him to hold his Eliquis on 06/12/2023 and 06/13/2023. Dr [name] will let him know when to re-start it.

## 2023-05-01 NOTE — Telephone Encounter (Signed)
Hello, just touching base on Mr Nicholas [name]. I just called him and he said that Cadence Fransico Michael PA-C said he needs to postpone his colon. Please confirm, thank you.

## 2023-05-01 NOTE — Telephone Encounter (Signed)
I spoke with Nicholas [name] and we have r/s'ed his colonoscopy to 06/13/2023 at 10:30AM. He has eaten today so we cancelled tomorrow's colonoscopy appointment. I am mailing out new instructions to him. See phone note 05/01/2023 for more information.

## 2023-05-01 NOTE — Telephone Encounter (Signed)
     Primary Cardiologist: [name], MD  Chart reviewed as part of pre-operative protocol coverage. Given past medical history and time since last visit, based on ACC/AHA guidelines, Nicholas [name] would be at acceptable risk for the planned procedure without further cardiovascular testing.   Discussed with Cadence Furth, PA who saw patient in office visit on 04/30/23, per Cadence he is at acceptable risk for colonoscopy if Eliquis can be held for 1 day prior to procedure as recommended by pharmacy. Please resume Eliquis as soon as safe to do so from a bleeding perspective.   According to RCRI patient moderate risk, 10.1% 30-day risk of death, MI, or cardiac arrest.   I will route this recommendation to the requesting party via Epic fax function and remove from pre-op pool.  Reather Littler, NP  05/01/2023, 11:29 AM

## 2023-05-02 ENCOUNTER — Encounter: Payer: 59 | Admitting: Internal Medicine

## 2023-05-06 DIAGNOSIS — R194 Change in bowel habit: Secondary | ICD-10-CM | POA: Diagnosis not present

## 2023-05-06 DIAGNOSIS — I482 Chronic atrial fibrillation, unspecified: Secondary | ICD-10-CM | POA: Diagnosis not present

## 2023-05-06 DIAGNOSIS — Z7901 Long term (current) use of anticoagulants: Secondary | ICD-10-CM | POA: Diagnosis not present

## 2023-05-15 DIAGNOSIS — E785 Hyperlipidemia, unspecified: Secondary | ICD-10-CM | POA: Diagnosis not present

## 2023-05-23 DIAGNOSIS — M19011 Primary osteoarthritis, right shoulder: Secondary | ICD-10-CM | POA: Diagnosis not present

## 2023-05-23 DIAGNOSIS — R2689 Other abnormalities of gait and mobility: Secondary | ICD-10-CM | POA: Diagnosis not present

## 2023-05-23 DIAGNOSIS — M6281 Muscle weakness (generalized): Secondary | ICD-10-CM | POA: Diagnosis not present

## 2023-05-23 DIAGNOSIS — I5032 Chronic diastolic (congestive) heart failure: Secondary | ICD-10-CM | POA: Diagnosis not present

## 2023-05-23 DIAGNOSIS — L97523 Non-pressure chronic ulcer of other part of left foot with necrosis of muscle: Secondary | ICD-10-CM | POA: Diagnosis not present

## 2023-05-23 DIAGNOSIS — I5023 Acute on chronic systolic (congestive) heart failure: Secondary | ICD-10-CM | POA: Diagnosis not present

## 2023-05-23 DIAGNOSIS — R262 Difficulty in walking, not elsewhere classified: Secondary | ICD-10-CM | POA: Diagnosis not present

## 2023-05-24 DIAGNOSIS — I5032 Chronic diastolic (congestive) heart failure: Secondary | ICD-10-CM | POA: Diagnosis not present

## 2023-05-24 DIAGNOSIS — R2689 Other abnormalities of gait and mobility: Secondary | ICD-10-CM | POA: Diagnosis not present

## 2023-05-27 DIAGNOSIS — I5032 Chronic diastolic (congestive) heart failure: Secondary | ICD-10-CM | POA: Diagnosis not present

## 2023-05-27 DIAGNOSIS — R2689 Other abnormalities of gait and mobility: Secondary | ICD-10-CM | POA: Diagnosis not present

## 2023-05-28 DIAGNOSIS — I5032 Chronic diastolic (congestive) heart failure: Secondary | ICD-10-CM | POA: Diagnosis not present

## 2023-05-28 DIAGNOSIS — R2689 Other abnormalities of gait and mobility: Secondary | ICD-10-CM | POA: Diagnosis not present

## 2023-05-29 DIAGNOSIS — R2689 Other abnormalities of gait and mobility: Secondary | ICD-10-CM | POA: Diagnosis not present

## 2023-05-29 DIAGNOSIS — I5032 Chronic diastolic (congestive) heart failure: Secondary | ICD-10-CM | POA: Diagnosis not present

## 2023-05-30 DIAGNOSIS — R2689 Other abnormalities of gait and mobility: Secondary | ICD-10-CM | POA: Diagnosis not present

## 2023-05-30 DIAGNOSIS — I5032 Chronic diastolic (congestive) heart failure: Secondary | ICD-10-CM | POA: Diagnosis not present

## 2023-05-31 DIAGNOSIS — R2689 Other abnormalities of gait and mobility: Secondary | ICD-10-CM | POA: Diagnosis not present

## 2023-05-31 DIAGNOSIS — I5032 Chronic diastolic (congestive) heart failure: Secondary | ICD-10-CM | POA: Diagnosis not present

## 2023-06-03 ENCOUNTER — Telehealth: Payer: Self-pay | Admitting: Internal Medicine

## 2023-06-03 DIAGNOSIS — Z7901 Long term (current) use of anticoagulants: Secondary | ICD-10-CM | POA: Diagnosis not present

## 2023-06-03 DIAGNOSIS — U071 COVID-19: Secondary | ICD-10-CM | POA: Diagnosis not present

## 2023-06-03 DIAGNOSIS — Z89422 Acquired absence of other left toe(s): Secondary | ICD-10-CM | POA: Diagnosis not present

## 2023-06-03 DIAGNOSIS — M792 Neuralgia and neuritis, unspecified: Secondary | ICD-10-CM | POA: Diagnosis not present

## 2023-06-03 DIAGNOSIS — M5136 Other intervertebral disc degeneration, lumbar region: Secondary | ICD-10-CM | POA: Diagnosis not present

## 2023-06-03 DIAGNOSIS — I5032 Chronic diastolic (congestive) heart failure: Secondary | ICD-10-CM | POA: Diagnosis not present

## 2023-06-03 DIAGNOSIS — R2689 Other abnormalities of gait and mobility: Secondary | ICD-10-CM | POA: Diagnosis not present

## 2023-06-03 DIAGNOSIS — E039 Hypothyroidism, unspecified: Secondary | ICD-10-CM | POA: Diagnosis not present

## 2023-06-03 DIAGNOSIS — I482 Chronic atrial fibrillation, unspecified: Secondary | ICD-10-CM | POA: Diagnosis not present

## 2023-06-03 DIAGNOSIS — I739 Peripheral vascular disease, unspecified: Secondary | ICD-10-CM | POA: Diagnosis not present

## 2023-06-03 NOTE — Telephone Encounter (Signed)
Spoke with Tobi Bastos, nurse at [facility] regarding patient having Colonoscopy on 8/29 and was just diagnosed with Covid 19 today. Advised her that if the patient is recovered and having no symptoms he can proceed with his Colonoscopy.

## 2023-06-03 NOTE — Telephone Encounter (Signed)
[facility] rep Cala Bradford) called to advise the patient tested positive for Covid 19 today and has a procedure scheduled for 06/13/23. Seeking advise. [phone] ask for her or a nurse.

## 2023-06-04 DIAGNOSIS — R2689 Other abnormalities of gait and mobility: Secondary | ICD-10-CM | POA: Diagnosis not present

## 2023-06-04 DIAGNOSIS — I5032 Chronic diastolic (congestive) heart failure: Secondary | ICD-10-CM | POA: Diagnosis not present

## 2023-06-06 DIAGNOSIS — I5032 Chronic diastolic (congestive) heart failure: Secondary | ICD-10-CM | POA: Diagnosis not present

## 2023-06-06 DIAGNOSIS — R2689 Other abnormalities of gait and mobility: Secondary | ICD-10-CM | POA: Diagnosis not present

## 2023-06-07 DIAGNOSIS — R2689 Other abnormalities of gait and mobility: Secondary | ICD-10-CM | POA: Diagnosis not present

## 2023-06-07 DIAGNOSIS — I5032 Chronic diastolic (congestive) heart failure: Secondary | ICD-10-CM | POA: Diagnosis not present

## 2023-06-08 DIAGNOSIS — R2689 Other abnormalities of gait and mobility: Secondary | ICD-10-CM | POA: Diagnosis not present

## 2023-06-08 DIAGNOSIS — I5032 Chronic diastolic (congestive) heart failure: Secondary | ICD-10-CM | POA: Diagnosis not present

## 2023-06-10 DIAGNOSIS — I5032 Chronic diastolic (congestive) heart failure: Secondary | ICD-10-CM | POA: Diagnosis not present

## 2023-06-10 DIAGNOSIS — R2689 Other abnormalities of gait and mobility: Secondary | ICD-10-CM | POA: Diagnosis not present

## 2023-06-11 DIAGNOSIS — I5032 Chronic diastolic (congestive) heart failure: Secondary | ICD-10-CM | POA: Diagnosis not present

## 2023-06-11 DIAGNOSIS — R2689 Other abnormalities of gait and mobility: Secondary | ICD-10-CM | POA: Diagnosis not present

## 2023-06-12 DIAGNOSIS — I5032 Chronic diastolic (congestive) heart failure: Secondary | ICD-10-CM | POA: Diagnosis not present

## 2023-06-12 DIAGNOSIS — R2689 Other abnormalities of gait and mobility: Secondary | ICD-10-CM | POA: Diagnosis not present

## 2023-06-13 ENCOUNTER — Ambulatory Visit (AMBULATORY_SURGERY_CENTER): Payer: 59 | Admitting: Internal Medicine

## 2023-06-13 ENCOUNTER — Encounter: Payer: Self-pay | Admitting: Internal Medicine

## 2023-06-13 VITALS — BP 123/64 | HR 69 | Temp 98.4°F | Resp 15 | Ht 68.0 in | Wt 257.0 lb

## 2023-06-13 DIAGNOSIS — R194 Change in bowel habit: Secondary | ICD-10-CM

## 2023-06-13 DIAGNOSIS — K5730 Diverticulosis of large intestine without perforation or abscess without bleeding: Secondary | ICD-10-CM

## 2023-06-13 DIAGNOSIS — I5032 Chronic diastolic (congestive) heart failure: Secondary | ICD-10-CM | POA: Diagnosis not present

## 2023-06-13 DIAGNOSIS — R2689 Other abnormalities of gait and mobility: Secondary | ICD-10-CM | POA: Diagnosis not present

## 2023-06-13 MED ORDER — SODIUM CHLORIDE 0.9 % IV SOLN: 500.0000 mL | Freq: Once | INTRAVENOUS | Status: DC

## 2023-06-13 MED ORDER — OMEPRAZOLE 40 MG PO CPDR: 40.0000 mg | DELAYED_RELEASE_CAPSULE | Freq: Every day | ORAL | 3 refills | Status: DC

## 2023-06-13 NOTE — Patient Instructions (Addendum)
Though you have diverticulosis - I did not se any polyps and no cause of loose stools.  Sometimes pantoprazole causes loose stools.  I am going to change to omeprazole instead.  So I sent a prescription to [facility] to start omeprazole 40 mg each day - instead of pantoprazole.  Restart Eliquis today.  I appreciate the opportunity to care for you. Iva Boop, MD, FACG   YOU HAD AN ENDOSCOPIC PROCEDURE TODAY AT THE [facility]:   Refer to the procedure report that was given to you for any specific questions about what was found during the examination.  If the procedure report does not answer your questions, please call your gastroenterologist to clarify.  If you requested that your care partner not be given the details of your procedure findings, then the procedure report has been included in a sealed envelope for you to review at your convenience later.  YOU SHOULD EXPECT: Some feelings of bloating in the abdomen. Passage of more gas than usual.  Walking can help get rid of the air that was put into your GI tract during the procedure and reduce the bloating. If you had a lower endoscopy (such as a colonoscopy or flexible sigmoidoscopy) you may notice spotting of blood in your stool or on the toilet paper. If you underwent a bowel prep for your procedure, you may not have a normal bowel movement for a few days.  Please Note:  You might notice some irritation and congestion in your nose or some drainage.  This is from the oxygen used during your procedure.  There is no need for concern and it should clear up in a day or so.  SYMPTOMS TO REPORT IMMEDIATELY:  Following lower endoscopy (colonoscopy or flexible sigmoidoscopy):  Excessive amounts of blood in the stool  Significant tenderness or worsening of abdominal pains  Swelling of the abdomen that is new, acute  Fever of 100F or higher  For urgent or emergent issues, a gastroenterologist can be reached at any hour  by calling [phone]. Do not use MyChart messaging for urgent concerns.    DIET:  We do recommend a small meal at first, but then you may proceed to your regular diet.  Drink plenty of fluids but you should avoid alcoholic beverages for 24 hours.  ACTIVITY:  You should plan to take it easy for the rest of today and you should NOT DRIVE or use heavy machinery until tomorrow (because of the sedation medicines used during the test).    FOLLOW UP: Our staff will call the number listed on your records the next business day following your procedure.  We will call around 7:15- 8:00 am to check on you and address any questions or concerns that you may have regarding the information given to you following your procedure. If we do not reach you, we will leave a message.     SIGNATURES/CONFIDENTIALITY: You and/or your care partner have signed paperwork which will be entered into your electronic medical record.  These signatures attest to the fact that that the information above on your After Visit Summary has been reviewed and is understood.  Full responsibility of the confidentiality of this discharge information lies with you and/or your care-partner.

## 2023-06-13 NOTE — Progress Notes (Signed)
[facility] History and Physical   Primary Care Physician:  Karna Dupes, MD   Reason for Procedure:    Encounter Diagnosis  Name Primary?   Altered bowel habits Yes     Plan:    colonoscopy     HPI: Nicholas [name] is a 72 y.o. male here top evaluate altered bowel habits  From 04/02/23 note 72 year old white male with a history of atrial fibrillation on Eliquis, history of stroke, history of erosive gastritis and duodenitis who presents for follow-up because of need for screening colonoscopy repeat (last 2014) as well as complaints of loose stools. He is having frequent loose stools may be better over time he was hospitalized with Pseudomonas urosepsis, has had some falls and is ended up in a nursing home. He is living there and is improved. He had some fecal incontinence but none in the last month. Complaining of urinary frequency as well and nocturia. Denies any rectal bleeding. There is some intermittent suprapubic abdominal pain.  Past Medical History:  Diagnosis Date   Acute kidney failure 08/2008   "cleared up"  no problems since   Acute meningitis 11/06/2022   Allergy    Anxiety    Arthritis    Asthma    ?of this no inhaler   Atrial fibrillation (HCC)    Blood transfusion    CHF (congestive heart failure) (HCC)    ?of this, pt denies   CLOSTRIDIUM DIFFICILE COLITIS 07/04/2010   Annotation: 12/09, 2/10 Qualifier: Diagnosis of  By: [name], [name]     COVID-19 08/12/22 completed Paxlovid 08/20/22 08/26/2022   Depression with anxiety    Duodenitis determined by biopsy 02/2016   peptic likely due to aleve (erosive gastropathy with duodenal erosions)   ECZEMA 07/04/2010   Qualifier: Diagnosis of  By: [name], [name]     Elevated liver enzymes    Fatty liver    Full dentures    GERD (gastroesophageal reflux disease)    Heart attack (HCC)    08/2008 (likely demand ischemia in the setting of MSSA sepsis/R TKA  infection)10-2008   History of hiatal  hernia    History of stomach ulcers    Hypothyroidism    Lower GI bleeding    MALAR AND MAXILLARY BONES CLOSED FRACTURE 07/04/2010   Annotation: ORIF Qualifier: Diagnosis of  By: [name], [name]     Migraine    "definitely"   MRSA (methicillin resistant Staphylococcus aureus)    in leg, had to place steel rod in leg   Personal history of colonic adenoma 06/01/2003   PFO (patent foramen ovale)    small PFO by 08/2008 TEE   Pneumonia 08/2008   "while in ICU"   Pneumonia due to COVID-19 virus 08/12/2022   Seizures (HCC) 2009   "long time ago"     Sleep apnea    does not wear CPAP   Stroke (HCC) 2010   unable to complete sentences at times   Wears glasses     Past Surgical History:  Procedure Laterality Date   AMPUTATION Left 01/24/2022   Procedure: LEFT GREAT TOE AMPUTATION AT METATARSOPHALANGEAL JOINT AND SECOND TOE AMPUTATION;  Surgeon: [name], MD;  Location: [facility] OR;  Service: Orthopedics;  Laterality: Left;   anterior  nerve transposition  07/2009   left ulnar nerve   antibiotic spacer exchange  11/2008; 08/2006   right knee   ARTHROTOMY  08/2008   right knee w/I&D   CARDIOVASCULAR STRESS TEST  2013  stress EKG - negative for ischemia, 4 min 7.3 METs, normal blood pressure response   CARDIOVERSION N/A 08/24/2022   Procedure: CARDIOVERSION;  Surgeon: Antonieta Iba, MD;  Location: [facility] ORS;  Service: Cardiovascular;  Laterality: N/A;   CATARACT EXTRACTION     COLONOSCOPY  11/2012   diverticulosis, rpt 10 yrs [name])   ESOPHAGOGASTRODUODENOSCOPY  02/2016   erosive gastropathy with duodenal erosions [name])   HARDWARE REMOVAL  04/2006   right knee w/antibiotic spacers placed   INGUINAL HERNIA REPAIR  early 1990's   bilateral   JOINT REPLACEMENT Bilateral    KNEE ARTHROSCOPY  10/2001   right   KNEE FUSION  03/2009   right knee removal; antibiotic spacers;    LEFT HEART CATH AND CORONARY ANGIOGRAPHY N/A 02/04/2018    WNL (Patwardhan, [name], MD)   LUMBAR  LAMINECTOMY/DECOMPRESSION MICRODISCECTOMY N/A 12/05/2018   Procedure: LUMBAR THREE TO FOUR AND LUMBAR FOUR TO FIVE DECOMPRESSION;  Surgeon: [name], MD;  Location: [facility] OR;  Service: Orthopedics;  Laterality: N/A;   MULTIPLE TOOTH EXTRACTIONS     REPLACEMENT TOTAL KNEE  08/2008; 09/2001   right   REVERSE SHOULDER ARTHROPLASTY Left 09/03/2017   Procedure: REVERSE SHOULDER ARTHROPLASTY;  Surgeon: [name], MD;  Location: [facility] OR;  Service: Orthopedics;  Laterality: Left;   REVERSE SHOULDER ARTHROPLASTY Right 05/13/2018   Procedure: RIGHT REVERSE SHOULDER ARTHROPLASTY;  Surgeon: [name], MD;  Location: [facility] OR;  Service: Orthopedics;  Laterality: Right;   rod placement Right 03/2009   R knee   SYNOVECTOMY  06/2005   debridement, liner exchange right knee   TEE WITHOUT CARDIOVERSION N/A 08/24/2022   Procedure: TRANSESOPHAGEAL ECHOCARDIOGRAM (TEE);  Surgeon: Antonieta Iba, MD;  Location: [facility] ORS;  Service: Cardiovascular;  Laterality: N/A;   TOE AMPUTATION Left 08/2008   great toe - osteomyelitis (staph infection)   TOE AMPUTATION Left 04/2016   2nd toe August Saucer)   TONSILLECTOMY     TOTAL KNEE ARTHROPLASTY Left 01/15/2017   TOTAL KNEE ARTHROPLASTY Left 01/15/2017   Procedure: TOTAL KNEE ARTHROPLASTY;  Surgeon: [name], MD;  Location: [facility] OR;  Service: Orthopedics;  Laterality: Left;    Prior to Admission medications   Medication Sig Start Date End Date Taking? Authorizing Provider  acetaminophen (TYLENOL) 500 MG tablet Take 1,000 mg by mouth 3 (three) times daily.   Yes [provider]  amitriptyline (ELAVIL) 50 MG tablet Take 0.5 tablets (25 mg total) by mouth in the morning and at bedtime. 03/08/23  Yes [name], MD  apixaban (ELIQUIS) 5 MG TABS tablet Take 1 tablet (5 mg total) by mouth 2 (two) times daily. 09/27/22  Yes [name], MD  atorvastatin (LIPITOR) 20 MG tablet TAKE ONE TABLET BY MOUTH ONCE DAILY 02/21/22  Yes [name], MD  Cholecalciferol (VITAMIN D3) 25 MCG (1000 UT) CAPS Take 1 capsule (1,000 Units total) by mouth daily. 03/24/21  Yes [name], MD  cyanocobalamin (VITAMIN B12) 1000 MCG tablet Take 1 tablet (1,000 mcg total) by mouth daily. 03/08/23  Yes [name], MD  divalproex (DEPAKOTE ER) 500 MG 24 hr tablet TAKE ONE TABLET BY MOUTH EVERYDAY AT BEDTIME 06/20/22  Yes Glean Salvo, NP  furosemide (LASIX) 20 MG tablet Take 1 tablet (20 mg total) by mouth 2 (two) times daily. 03/08/23  Yes [name], MD  gabapentin (NEURONTIN) 300 MG capsule Take 600 mg by mouth 3 (three) times daily. 300 mg in the am, 600 mg in the  afternoon, and 600 mg in the evening 03/08/23  Yes [name], MD  levothyroxine (SYNTHROID) 125 MCG tablet Take 1 tablet (125 mcg total) by mouth daily before breakfast. 03/08/23  Yes [name], MD  Menthol, Topical Analgesic, (BIOFREEZE) 4 % GEL Apply 1 Application topically daily. Neck pain   Yes [provider]  metoprolol succinate (TOPROL-XL) 100 MG 24 hr tablet Take 1 tablet (100 mg total) by mouth at bedtime. Take with or immediately following a meal. 08/28/22  Yes Lurene Shadow, MD  pantoprazole (PROTONIX) 40 MG tablet Take 1 tablet (40 mg total) by mouth every other day. 03/08/23  Yes [name], MD  tamsulosin (FLOMAX) 0.4 MG CAPS capsule TAKE ONE CAPSULE BY MOUTH ONCE DAILY 11/20/22  Yes [name], MD  Ubrogepant (UBRELVY) 100 MG TABS Take 100 mg by mouth as needed (take 1 tablet at onset of headache, may repeat in hours, max is 200 mg in 24 hours). 07/24/22  Yes Glean Salvo, NP  Ubrogepant (UBRELVY) 50 MG TABS Take 1 tablet by mouth daily as needed.   Yes [provider]  azelastine (ASTELIN) 0.1 % nasal spray Place 2 sprays into both nostrils daily. Use in each nostril as directed 03/08/23   [name], MD  cetirizine (ZYRTEC) 10 MG tablet Take 1 tablet (10 mg total) by mouth daily. 07/25/22   Eden Emms, NP  EPINEPHrine 0.3 mg/0.3 mL IJ SOAJ injection Inject 0.3 mg into the muscle as needed for anaphylaxis. (bee stings) 12/18/21   [name], MD  ferrous sulfate 325 (65 FE) MG EC tablet Take 325 mg by mouth daily with breakfast. Patient not taking: Reported on 04/30/2023    [provider]  nitroGLYCERIN (NITROSTAT) 0.4 MG SL tablet Place 1 tablet (0.4 mg total) under the tongue every 5 (five) minutes as needed for chest pain. 11/27/21   [name], MD  ondansetron (ZOFRAN-ODT) 4 MG disintegrating tablet TAKE ONE TABLET BY MOUTH every EIGHT hours AS NEEDED 10/04/22   Glean Salvo, NP  Probiotic Product (PROBIOTIC DAILY) CAPS Take 1 capsule by mouth. 1 capsule in the morning every 3 days    [provider]  tiZANidine (ZANAFLEX) 4 MG tablet TAKE ONE TABLET BY MOUTH every SIX hours AS NEEDED FOR acute HEADACHE 10/04/22   Glean Salvo, NP    Current Outpatient Medications  Medication Sig Dispense Refill   acetaminophen (TYLENOL) 500 MG tablet Take 1,000 mg by mouth 3 (three) times daily.     amitriptyline (ELAVIL) 50 MG tablet Take 0.5 tablets (25 mg total) by mouth in the morning and at bedtime.     apixaban (ELIQUIS) 5 MG TABS tablet Take 1 tablet (5 mg total) by mouth 2 (two) times daily. 60 tablet 3   atorvastatin (LIPITOR) 20 MG tablet TAKE ONE TABLET BY MOUTH ONCE DAILY 90 tablet 3   Cholecalciferol (VITAMIN D3) 25 MCG (1000 UT) CAPS Take 1 capsule (1,000 Units total) by mouth daily. 90 capsule 1   cyanocobalamin (VITAMIN B12) 1000 MCG tablet Take 1 tablet (1,000 mcg total) by mouth daily.     divalproex (DEPAKOTE ER) 500 MG 24 hr tablet TAKE ONE TABLET BY MOUTH EVERYDAY AT BEDTIME 30 tablet 5   furosemide (LASIX) 20 MG tablet Take 1 tablet (20 mg total) by mouth 2 (two) times daily.     gabapentin (NEURONTIN) 300 MG capsule Take 600 mg by mouth 3 (three) times daily. 300 mg in the am, 600 mg in the  afternoon, and 600 mg in the evening     levothyroxine  (SYNTHROID) 125 MCG tablet Take 1 tablet (125 mcg total) by mouth daily before breakfast. 90 tablet 4   Menthol, Topical Analgesic, (BIOFREEZE) 4 % GEL Apply 1 Application topically daily. Neck pain     metoprolol succinate (TOPROL-XL) 100 MG 24 hr tablet Take 1 tablet (100 mg total) by mouth at bedtime. Take with or immediately following a meal.     pantoprazole (PROTONIX) 40 MG tablet Take 1 tablet (40 mg total) by mouth every other day.     tamsulosin (FLOMAX) 0.4 MG CAPS capsule TAKE ONE CAPSULE BY MOUTH ONCE DAILY 30 capsule 2   Ubrogepant (UBRELVY) 100 MG TABS Take 100 mg by mouth as needed (take 1 tablet at onset of headache, may repeat in hours, max is 200 mg in 24 hours). 12 tablet 11   Ubrogepant (UBRELVY) 50 MG TABS Take 1 tablet by mouth daily as needed.     azelastine (ASTELIN) 0.1 % nasal spray Place 2 sprays into both nostrils daily. Use in each nostril as directed     cetirizine (ZYRTEC) 10 MG tablet Take 1 tablet (10 mg total) by mouth daily. 30 tablet 0   EPINEPHrine 0.3 mg/0.3 mL IJ SOAJ injection Inject 0.3 mg into the muscle as needed for anaphylaxis. (bee stings) 2 each 1   ferrous sulfate 325 (65 FE) MG EC tablet Take 325 mg by mouth daily with breakfast. (Patient not taking: Reported on 04/30/2023)     nitroGLYCERIN (NITROSTAT) 0.4 MG SL tablet Place 1 tablet (0.4 mg total) under the tongue every 5 (five) minutes as needed for chest pain. 20 tablet 3   ondansetron (ZOFRAN-ODT) 4 MG disintegrating tablet TAKE ONE TABLET BY MOUTH every EIGHT hours AS NEEDED 20 tablet 3   Probiotic Product (PROBIOTIC DAILY) CAPS Take 1 capsule by mouth. 1 capsule in the morning every 3 days     tiZANidine (ZANAFLEX) 4 MG tablet TAKE ONE TABLET BY MOUTH every SIX hours AS NEEDED FOR acute HEADACHE 20 tablet 1   Current Facility-Administered Medications  Medication Dose Route Frequency Provider Last Rate Last Admin   0.9 %  sodium chloride infusion  500 mL Intravenous Once Iva Boop, MD         Allergies as of 06/13/2023 - Review Complete 06/13/2023  Allergen Reaction Noted   Peanut-containing drug products Anaphylaxis and Dermatitis 01/10/2012   Yellow jacket venom [bee venom] Anaphylaxis and Other (See Comments) 01/29/2016   Celebrex [celecoxib] Other (See Comments) 01/29/2016   Percocet [oxycodone-acetaminophen] Hives, Swelling, and Other (See Comments) 01/10/2012   Latex Rash 01/29/2016   Nsaids Rash 01/10/2012    Family History  Problem Relation Age of Onset   Coronary artery disease Father 51   Stroke Father    CAD Mother 105       stents   Breast cancer Sister    Diabetes Brother    Diabetes Sister    Colon cancer Neg Hx    Stomach cancer Neg Hx     Social History   Socioeconomic History   Marital status: Widowed    Spouse name: Not on file   Number of children: 2   Years of education: 5   Highest education level: Not on file  Occupational History   Occupation: retired  Tobacco Use   Smoking status: Never    Passive exposure: Past   Smokeless tobacco: Never  Vaping Use   Vaping status: Never Used  Substance and Sexual Activity   Alcohol use: No    Alcohol/week: 0.0 standard drinks of alcohol    Comment: "I abused alcohol; last drink  ~ 2005"   Drug use: No   Sexual activity: Not Currently  Other Topics Concern   Not on file  Social History Narrative   Widower 2016 - wife passed from ESRD related illness   Lives in [facility] in [place].    Mormon      Screened positive for OSA 04/2018   Social Determinants of Health   Financial Resource Strain: Low Risk  (12/11/2022)   Overall Financial Resource Strain (CARDIA)    Difficulty of Paying Living Expenses: Not hard at all  Food Insecurity: No Food Insecurity (12/11/2022)   Hunger Vital Sign    Worried About Running Out of Food in the Last Year: Never true    Ran Out of Food in the Last Year: Never true  Transportation Needs: No Transportation Needs (12/11/2022)   PRAPARE -  Administrator, Civil Service (Medical): No    Lack of Transportation (Non-Medical): No  Recent Concern: Transportation Needs - Unmet Transportation Needs (11/07/2022)   PRAPARE - Transportation    Lack of Transportation (Medical): Yes    Lack of Transportation (Non-Medical): Yes  Physical Activity: Insufficiently Active (12/11/2022)   Exercise Vital Sign    Days of Exercise per Week: 3 days    Minutes of Exercise per Session: 20 min  Stress: No Stress Concern Present (12/11/2022)   [employer] of Occupational Health - Occupational Stress Questionnaire    Feeling of Stress : Not at all  Social Connections: Moderately Isolated (12/11/2022)   Social Connection and Isolation Panel [NHANES]    Frequency of Communication with Friends and Family: More than three times a week    Frequency of Social Gatherings with Friends and Family: Once a week    Attends Religious Services: More than 4 times per year    Active Member of [employer] or Organizations: No    Attends Banker Meetings: Never    Marital Status: Widowed  Intimate Partner Violence: Not At Risk (12/11/2022)   Humiliation, Afraid, Rape, and Kick questionnaire    Fear of Current or Ex-Partner: No    Emotionally Abused: No    Physically Abused: No    Sexually Abused: No    Review of Systems:  All other review of systems negative except as mentioned in the HPI.  Physical Exam: Vital signs BP 106/67   Pulse 77   Temp 98.4 F (36.9 C) (Temporal)   Ht 5\' 8"  (1.727 m)   Wt 257 lb (116.6 kg)   SpO2 97%   BMI 39.08 kg/m   General:   Alert,  Well-developed, well-nourished, pleasant and cooperative in NAD Lungs:  Clear throughout to auscultation.   Heart:  Regular rate and rhythm; no murmurs, clicks, rubs,  or gallops. Abdomen:  Soft, nontender and nondistended. Normal bowel sounds.   Neuro/Psych:  Alert and cooperative. Normal mood and affect. A and O x 3   @Neamiah Sciarra  Sena Slate, MD, [facility]  Gastroenterology [phone] (pager) 06/13/2023 10:42 AM@

## 2023-06-13 NOTE — Progress Notes (Signed)
Pt's states no medical or surgical changes since previsit or office visit. 

## 2023-06-13 NOTE — Progress Notes (Signed)
Sedate, gd SR, tolerated procedure well, VSS, report to RN 

## 2023-06-13 NOTE — Progress Notes (Signed)
RX for Omeprazole printed out and given to care partner to take to nursing home  Pt sent home in gowns- assisted with help to wheelchair.

## 2023-06-13 NOTE — Op Note (Signed)
[facility] Patient Name: Nicholas [name] Procedure Date: 06/13/2023 10:34 AM MRN: [id number] Endoscopist: Iva Boop , MD, [id number] Age: 72 Referring MD:  Date of Birth: 01/05/1951 Gender: Male Account #: [id number] Procedure:                Colonoscopy Indications:              Change in bowel habits Medicines:                Monitored Anesthesia Care Procedure:                Pre-Anesthesia Assessment:                           - Prior to the procedure, a History and Physical                            was performed, and patient medications and                            allergies were reviewed. The patient's tolerance of                            previous anesthesia was also reviewed. The risks                            and benefits of the procedure and the sedation                            options and risks were discussed with the patient.                            All questions were answered, and informed consent                            was obtained. Prior Anticoagulants: The patient                            last took Eliquis (apixaban) 2 days prior to the                            procedure. ASA Grade Assessment: III - A patient                            with severe systemic disease. After reviewing the                            risks and benefits, the patient was deemed in                            satisfactory condition to undergo the procedure.                           After obtaining informed consent, the colonoscope  was passed under direct vision. Throughout the                            procedure, the patient's blood pressure, pulse, and                            oxygen saturations were monitored continuously. The                            CF [id number] #[id number] was introduced through the anus                            and advanced to the the cecum, identified by                            appendiceal orifice and  ileocecal valve. The                            colonoscopy was performed without difficulty. The                            patient tolerated the procedure well. The quality                            of the bowel preparation was excellent. The                            ileocecal valve, appendiceal orifice, and rectum                            were photographed. Scope In: 11:07:34 AM Scope Out: 11:18:48 AM Scope Withdrawal Time: 0 hours 5 minutes 51 seconds  Total Procedure Duration: 0 hours 11 minutes 14 seconds  Findings:                 The perianal and digital rectal examinations were                            normal.                           Scattered diverticula were found in the entire                            colon.                           The exam was otherwise without abnormality on                            direct and retroflexion views. Complications:            No immediate complications. Estimated Blood Loss:     Estimated blood loss: none. Impression:               - Diverticulosis in the entire examined colon.                           -  The examination was otherwise normal on direct                            and retroflexion views.                           - No specimens collected. Recommendation:           - Patient has a contact number available for                            emergencies. The signs and symptoms of potential                            delayed complications were discussed with the                            patient. Return to normal activities tomorrow.                            Written discharge instructions were provided to the                            patient.                           - Resume previous diet.                           - Resume Eliquis (apixaban) at prior dose today.                           - He has episodic loose stools and some urge fecal                            incontinence (also urinary).                            I have ordered omeprazole instead of pantoprazole                            to see if that will make a difference, as can get                            loose stools/diarrhea from pantoprazole in some                            people. Iva Boop, MD 06/13/2023 11:29:37 AM This report has been signed electronically.

## 2023-06-14 ENCOUNTER — Telehealth: Payer: Self-pay | Admitting: *Deleted

## 2023-06-14 DIAGNOSIS — I5032 Chronic diastolic (congestive) heart failure: Secondary | ICD-10-CM | POA: Diagnosis not present

## 2023-06-14 DIAGNOSIS — R2689 Other abnormalities of gait and mobility: Secondary | ICD-10-CM | POA: Diagnosis not present

## 2023-06-14 NOTE — Telephone Encounter (Signed)
  Follow up Call-     06/13/2023   10:16 AM  Call back number  Post procedure Call Back phone  # [phone]  Permission to leave phone message Yes     Patient questions:  Do you have a fever, pain , or abdominal swelling? No. Pain Score  0 *  Have you tolerated food without any problems? Yes.    Have you been able to return to your normal activities? Yes.    Do you have any questions about your discharge instructions: Diet   No. Medications  No. Follow up visit  No.  Do you have questions or concerns about your Care? No.  Actions: * If pain score is 4 or above: No action needed, pain <4.

## 2023-06-19 ENCOUNTER — Encounter: Payer: Self-pay | Admitting: Podiatry

## 2023-06-19 ENCOUNTER — Ambulatory Visit (INDEPENDENT_AMBULATORY_CARE_PROVIDER_SITE_OTHER): Payer: 59 | Admitting: Podiatry

## 2023-06-19 DIAGNOSIS — D689 Coagulation defect, unspecified: Secondary | ICD-10-CM

## 2023-06-19 DIAGNOSIS — L84 Corns and callosities: Secondary | ICD-10-CM | POA: Diagnosis not present

## 2023-06-20 NOTE — Progress Notes (Signed)
Subjective:   Patient ID: Nicholas [name], male   DOB: 72 y.o.   MRN: [id number]   HPI Patient presents with painful callus underneath the left foot with patient being high risk with history of amputation   ROS      Objective:  Physical Exam  Neurovascular status intact large thick keratotic lesion subfirst metatarsal head left with history of left first hallux amputation     Assessment:  Chronic lesion high risk patient painful with history of amputation hallux     Plan:  Sterile sharp debridement of lesion no iatrogenic bleeding reappoint routine care and may need other treatments depending on response

## 2023-06-23 DIAGNOSIS — I5023 Acute on chronic systolic (congestive) heart failure: Secondary | ICD-10-CM | POA: Diagnosis not present

## 2023-06-23 DIAGNOSIS — L97523 Non-pressure chronic ulcer of other part of left foot with necrosis of muscle: Secondary | ICD-10-CM | POA: Diagnosis not present

## 2023-06-23 DIAGNOSIS — M19011 Primary osteoarthritis, right shoulder: Secondary | ICD-10-CM | POA: Diagnosis not present

## 2023-06-23 DIAGNOSIS — M6281 Muscle weakness (generalized): Secondary | ICD-10-CM | POA: Diagnosis not present

## 2023-06-23 DIAGNOSIS — R262 Difficulty in walking, not elsewhere classified: Secondary | ICD-10-CM | POA: Diagnosis not present

## 2023-07-03 DIAGNOSIS — R519 Headache, unspecified: Secondary | ICD-10-CM | POA: Diagnosis not present

## 2023-07-03 DIAGNOSIS — M7989 Other specified soft tissue disorders: Secondary | ICD-10-CM | POA: Diagnosis not present

## 2023-07-03 DIAGNOSIS — Z9889 Other specified postprocedural states: Secondary | ICD-10-CM | POA: Diagnosis not present

## 2023-07-09 ENCOUNTER — Ambulatory Visit: Payer: 59 | Admitting: Family Medicine

## 2023-07-12 DIAGNOSIS — I10 Essential (primary) hypertension: Secondary | ICD-10-CM | POA: Diagnosis not present

## 2023-07-12 DIAGNOSIS — D649 Anemia, unspecified: Secondary | ICD-10-CM | POA: Diagnosis not present

## 2023-07-17 ENCOUNTER — Ambulatory Visit (INDEPENDENT_AMBULATORY_CARE_PROVIDER_SITE_OTHER): Payer: 59 | Admitting: Podiatry

## 2023-07-17 DIAGNOSIS — E1142 Type 2 diabetes mellitus with diabetic polyneuropathy: Secondary | ICD-10-CM | POA: Diagnosis not present

## 2023-07-17 DIAGNOSIS — D689 Coagulation defect, unspecified: Secondary | ICD-10-CM | POA: Diagnosis not present

## 2023-07-17 DIAGNOSIS — B351 Tinea unguium: Secondary | ICD-10-CM

## 2023-07-17 DIAGNOSIS — M2041 Other hammer toe(s) (acquired), right foot: Secondary | ICD-10-CM

## 2023-07-17 DIAGNOSIS — M79675 Pain in left toe(s): Secondary | ICD-10-CM

## 2023-07-17 DIAGNOSIS — M79674 Pain in right toe(s): Secondary | ICD-10-CM

## 2023-07-17 DIAGNOSIS — Z899 Acquired absence of limb, unspecified: Secondary | ICD-10-CM

## 2023-07-17 DIAGNOSIS — M2042 Other hammer toe(s) (acquired), left foot: Secondary | ICD-10-CM

## 2023-07-17 DIAGNOSIS — L84 Corns and callosities: Secondary | ICD-10-CM | POA: Diagnosis not present

## 2023-07-17 DIAGNOSIS — Z23 Encounter for immunization: Secondary | ICD-10-CM | POA: Diagnosis not present

## 2023-07-17 NOTE — Progress Notes (Signed)
Subjective:  Patient ID: Nicholas [name], male    DOB: Jan 01, 1951,   MRN: [id number]  Chief Complaint  Patient presents with   Nail Problem    RFC    72 y.o. male presents for concern of thickened elongated and painful nails that are difficult to trim. Requesting to have them trimmed today. Relates burning and tingling in their feet. Patient relates he was recently diangosed with diabetes and unclear from chart. No recent A1c. He is on eliquis and at risk for routine foot care  PCP:  Karna Dupes, MD    . Denies any other pedal complaints. Denies n/v/f/c.   Past Medical History:  Diagnosis Date   Acute kidney failure 08/2008   "cleared up"  no problems since   Acute meningitis 11/06/2022   Allergy    Anxiety    Arthritis    Asthma    ?of this no inhaler   Atrial fibrillation (HCC)    Blood transfusion    CHF (congestive heart failure) (HCC)    ?of this, pt denies   CLOSTRIDIUM DIFFICILE COLITIS 07/04/2010   Annotation: 12/09, 2/10 Qualifier: Diagnosis of  By: [name], [name]     COVID-19 08/12/22 completed Paxlovid 08/20/22 08/26/2022   Depression with anxiety    Duodenitis determined by biopsy 02/2016   peptic likely due to aleve (erosive gastropathy with duodenal erosions)   ECZEMA 07/04/2010   Qualifier: Diagnosis of  By: [name], [name]     Elevated liver enzymes    Fatty liver    Full dentures    GERD (gastroesophageal reflux disease)    Heart attack (HCC)    08/2008 (likely demand ischemia in the setting of MSSA sepsis/R TKA  infection)10-2008   History of hiatal hernia    History of stomach ulcers    Hypothyroidism    Lower GI bleeding    MALAR AND MAXILLARY BONES CLOSED FRACTURE 07/04/2010   Annotation: ORIF Qualifier: Diagnosis of  By: [name], [name]     Migraine    "definitely"   MRSA (methicillin resistant Staphylococcus aureus)    in leg, had to place steel rod in leg   Personal history of colonic adenoma 06/01/2003   PFO (patent foramen  ovale)    small PFO by 08/2008 TEE   Pneumonia 08/2008   "while in ICU"   Pneumonia due to COVID-19 virus 08/12/2022   Seizures (HCC) 2009   "long time ago"     Sleep apnea    does not wear CPAP   Stroke (HCC) 2010   unable to complete sentences at times   Wears glasses     Objective:  Physical Exam: Vascular: DP/PT pulses 2/4 bilateral. CFT <3 seconds. Absent hair growth on digits. Edema noted to bilateral lower extremities. Xerosis noted bilaterally.  Skin. No lacerations or abrasions bilateral feet. Nails 3-5 bilateral  and 1,2 on right are thickened discolored and elongated with subungual debris. Hyperkeratotic lesion noted sub first metatarsal head on left.  Musculoskeletal: MMT 5/5 bilateral lower extremities in DF, PF, Inversion and Eversion. Deceased ROM in DF of ankle joint. Left hallux and second digit amputated.  Neurological: Sensation intact to light touch. Protective sensation diminished bilateral.    Assessment:   1. Pain due to onychomycosis of toenails of both feet   2. Coagulation defect (HCC)   3. Corns and callosities   4. Type 2 diabetes mellitus with peripheral neuropathy (HCC)   5. History of amputation   6. Hammertoe, bilateral  Plan:  Patient was evaluated and treated and all questions answered. -Discussed and educated patient on diabetic foot care, especially with  regards to the vascular, neurological and musculoskeletal systems.  -Stressed the importance of good glycemic control and the detriment of not  controlling glucose levels in relation to the foot. -Discussed supportive shoes at all times and checking feet regularly.  -Mechanically debrided all nails 1-5 bilateral using sterile nail nipper and filed with dremel without incident  -DM shoe appointment  -Answered all patient questions -Patient to return  in 3 months for at risk foot care -Patient advised to call the office if any problems or questions arise in the meantime.   Louann Sjogren, DPM

## 2023-07-23 DIAGNOSIS — M19011 Primary osteoarthritis, right shoulder: Secondary | ICD-10-CM | POA: Diagnosis not present

## 2023-07-23 DIAGNOSIS — R262 Difficulty in walking, not elsewhere classified: Secondary | ICD-10-CM | POA: Diagnosis not present

## 2023-07-23 DIAGNOSIS — L97523 Non-pressure chronic ulcer of other part of left foot with necrosis of muscle: Secondary | ICD-10-CM | POA: Diagnosis not present

## 2023-07-23 DIAGNOSIS — M6281 Muscle weakness (generalized): Secondary | ICD-10-CM | POA: Diagnosis not present

## 2023-07-23 DIAGNOSIS — I5023 Acute on chronic systolic (congestive) heart failure: Secondary | ICD-10-CM | POA: Diagnosis not present

## 2023-07-24 DIAGNOSIS — I10 Essential (primary) hypertension: Secondary | ICD-10-CM | POA: Diagnosis not present

## 2023-07-24 DIAGNOSIS — D649 Anemia, unspecified: Secondary | ICD-10-CM | POA: Diagnosis not present

## 2023-07-31 ENCOUNTER — Ambulatory Visit: Payer: 59 | Attending: Internal Medicine | Admitting: Internal Medicine

## 2023-07-31 ENCOUNTER — Encounter: Payer: Self-pay | Admitting: Internal Medicine

## 2023-07-31 VITALS — BP 114/67 | HR 67 | Ht 72.0 in | Wt 272.6 lb

## 2023-07-31 DIAGNOSIS — I77810 Thoracic aortic ectasia: Secondary | ICD-10-CM | POA: Diagnosis not present

## 2023-07-31 DIAGNOSIS — R296 Repeated falls: Secondary | ICD-10-CM

## 2023-07-31 DIAGNOSIS — R072 Precordial pain: Secondary | ICD-10-CM | POA: Diagnosis not present

## 2023-07-31 DIAGNOSIS — R600 Localized edema: Secondary | ICD-10-CM

## 2023-07-31 DIAGNOSIS — I480 Paroxysmal atrial fibrillation: Secondary | ICD-10-CM

## 2023-07-31 MED ORDER — METOPROLOL TARTRATE 100 MG PO TABS: ORAL_TABLET | ORAL | 0 refills | Status: DC

## 2023-07-31 NOTE — Progress Notes (Signed)
Cardiology Office Note:  .   Date:  07/31/2023  ID:  [name], DOB Aug 13, 1951, MRN [id number] PCP: Karna Dupes, MD  [facility] Providers Cardiologist:  [name], MD     History of Present Illness: .   Discussed the use of AI scribe software for clinical note transcription with the patient, who gave verbal consent to proceed.  QUSAI POCASANGRE is a 72 y.o. male with history of myocardial infarction with nonobstructive coronary artery disease (MINOCA), paroxysmal atrial fibrillation, dilated thoracic aorta, stroke, questionable CHF, gastric ulcers, and obstructive sleep apnea, who presents for follow-up of atrial fibrillation with rapid ventricular response.  He initially presented with atrial fibrillation with rapid ventricular response and mildly elevated troponin after a fall in the bathtub in 08/2022.  This had been preceded by COVID-19 infection a week earlier.  He underwent TEE-guided cardioversion with restoration of sinus rhythm.  He was seen in follow-up once in our office in July by Terrilee Croak, [place], for preop evaluation.  It was noted that he had had multiple ED visits and hospital admissions since we met him in November.  He was residing in a skilled nursing facility due to recurrent falls.  He was without chest pain or shortness of breath.  EKG at that visit demonstrated sinus bradycardia with left axis deviation and nonspecific T wave changes.  Mr. Sester presents alone today, though he still resides in an SNF. He reports intermittent palpitations occurring every couple of weeks, lasting for a few minutes without associated symptoms such as dizziness, chest pain, or shortness of breath. He also describes discomfort in the right side of his chest, which occurs sporadically and is associated with physical activity. The discomfort improves with rest. The patient also reports bilateral lower extremity edema, which has not improved despite an increase in his furosemide  dosage. He experiences shortness of breath with exertion and sleeps on three pillows at night due to discomfort, not dyspnea.  The patient has been considering moving to a private apartment. He has a history of recurrent falls, which led to his placement in the nursing home. He is currently on apixaban without report of any bleeding.     ROS: See HPI  Studies Reviewed: [name]   EKG Interpretation Date/Time:  Wednesday July 31 2023 09:18:27 EDT Ventricular Rate:  67 PR Interval:  192 QRS Duration:  84 QT Interval:  418 QTC Calculation: 441 R Axis:   -33  Text Interpretation: Artifact Normal sinus rhythm Left axis deviation Low voltage QRS When compared with ECG of 30-Apr-2023 09:07, HEART RATE has increased Confirmed by Charli Liberatore ([id number]) on 07/31/2023 9:26:26 AM    TTE (08/20/2022): Normal LV size and wall thickness.  LVEF 55-60%.  Normal RV size and function.  Mild left atrial enlargement.  No pericardial effusion.  Moderate mitral regurgitation.  Trivial tricuspid regurgitation.  Mild aortic regurgitation.  Mild dilation of the ascending aorta (4.2 cm). Risk Assessment/Calculations:    CHA2DS2-VASc Score = 6   This indicates a 9.7% annual risk of stroke. The patient's score is based upon: CHF History: 1 HTN History: 0 Diabetes History: 1 Stroke History: 2 Vascular Disease History: 1 Age Score: 1 Gender Score: 0            Physical Exam:   VS:  BP 114/67 (BP Location: Left Arm, Patient Position: Sitting, Cuff Size: Normal)   Pulse 67   Ht 6' (1.829 m)   Wt 272 lb 9.6 oz (123.7 kg)  SpO2 93%   BMI 36.97 kg/m    Wt Readings from Last 3 Encounters:  07/31/23 272 lb 9.6 oz (123.7 kg)  06/13/23 257 lb (116.6 kg)  04/30/23 258 lb (117 kg)    General:  NAD. Neck: No JVD or HJR. Lungs: Clear to auscultation bilaterally without wheezes or crackles. Heart: Regular rate and rhythm without murmurs, rubs, or gallops. Abdomen: Soft, nontender, nondistended. Extremities:  Trace pretibial edema bilaterally.  ASSESSMENT AND PLAN: .    Paroxysmal atrial fibrillation: Mr. Blase reports sporadic self-limited palpitations without associated symptoms.  He is in sinus rhythm today.  We have agreed to defer ambulatory cardiac monitoring.  Continue current doses of apixaban and metoprolol succinate.  Chest pain: Chest discomfort is atypical given its right-sided nature of though it seems to be worsened with exertion.  I reviewed Mr. Belfield catheterization from 2019, which did not show any significant CAD.  However, given his exertional symptoms and prior troponin elevations while in atrial fibrillation with rapid ventricular response, we agreed to perform a coronary CTA for further evaluation.  We will check a BMP today to ensure stable renal function and electrolytes.  Dilated thoracic aorta: Incidentally noted on echo at the time of admission for atrial fibrillation in 08/2022.  We will proceed with cardiac CTA, as above, which will also allow [place] to better assess Mr. Marcoe's ascending aorta.  Lower extremity edema: This is likely multifactorial including venous insufficiency and a possible component of HFpEF.  I think it is reasonable to continue current regimen of furosemide as well as pursue leg elevation and compression stocking use.  Will check a BMP today to ensure stable renal function and electrolytes.  Falls: No fall reported since his last visit.  I advised Mr. Mcclees that he needs to speak with his family as well as the rehabilitation staff at his skilled nursing home regarding the safety of him returning to a private apartment.    Dispo: Return to clinic in 6 months, sooner if symptoms worsen or cardiac CTA demonstrates significant abnormality.  Signed, [name], MD

## 2023-07-31 NOTE — Patient Instructions (Addendum)
Medication Instructions:  Your physician recommends that you continue on your current medications as directed. Please refer to the Current Medication list given to you today.   *If you need a refill on your cardiac medications before your next appointment, please call your pharmacy*   Lab Work: Your provider would like for you to have following labs drawn today (BMP).     Testing/Procedures: Cardiac CT Angiography (CTA), is a special type of CT scan that uses a computer to produce multi-dimensional views of major blood vessels throughout the body. In CT angiography, a contrast material is injected through an IV to help visualize the blood vessels  Please see instructions below   Follow-Up: At [facility], you and your health needs are our priority.  As part of our continuing mission to provide you with exceptional heart care, we have created designated Provider Care Teams.  These Care Teams include your primary Cardiologist (physician) and Advanced Practice Providers (APPs -  Physician Assistants and Nurse Practitioners) who all work together to provide you with the care you need, when you need it.  We recommend signing up for the patient portal called "MyChart".  Sign up information is provided on this After Visit Summary.  MyChart is used to connect with patients for Virtual Visits (Telemedicine).  Patients are able to view lab/test results, encounter notes, upcoming appointments, etc.  Non-urgent messages can be sent to your provider as well.   To learn more about what you can do with MyChart, go to [email or web address].    Your next appointment:   6 month(s)  Provider:   You may see [name], MD or one of the following Advanced Practice Providers on your designated Care Team:   Nicolasa Ducking, NP Eula Listen, PA-C Cadence Fransico Michael, PA-C Charlsie Quest, NP      Your cardiac CT will be scheduled at one of the below locations:   [facility] [address] [place], [place] [address] [phone]  OR   [facility] [address] [place], [place] [address] [phone]  If scheduled at [facility], please arrive at the [facility] and Children's Entrance (Entrance C2) of [facility] 30 minutes prior to test start time. You can use the FREE valet parking offered at entrance C (encouraged to control the heart rate for the test)  Proceed to the [facility] Radiology Department (first floor) to check-in and test prep.  All radiology patients and guests should use entrance C2 at [facility], accessed from [facility], even though the hospital's physical address listed is [address]    If scheduled at [facility] or [facility], please arrive 15 mins early for check-in and test prep.  There is spacious parking and easy access to the radiology department from the [facility] Heart and Vascular entrance. Please enter here and check-in with the desk attendant.   Please follow these instructions carefully (unless otherwise directed):  An IV will be required for this test and Nitroglycerin will be given.  Hold all erectile dysfunction medications at least 3 days (72 hrs) prior to test. (Ie viagra, cialis, sildenafil, tadalafil, etc)   On the Night Before the Test: Be sure to Drink plenty of water. Do not consume any caffeinated/decaffeinated beverages or chocolate 12 hours prior to your test. Do not take any antihistamines 12 hours prior to your test.   On the Day of the Test:  Drink plenty of water until 1 hour prior to the test. Do not eat any food 1 hour prior to test. You may take your regular medications prior to the test.  Hold Metoprolol Succinate morning of procedure Take metoprolol (Lopressor) 100 mg two hours prior to test. If you take Furosemide please HOLD on the morning of the  test. FEMALES- please wear underwire-free bra if available, avoid dresses & tight clothing       After the Test: Drink plenty of water. After receiving IV contrast, you may experience a mild flushed feeling. This is normal. On occasion, you may experience a mild rash up to 24 hours after the test. This is not dangerous. If this occurs, you can take Benadryl 25 mg and increase your fluid intake. If you experience trouble breathing, this can be serious. If it is severe call 911 IMMEDIATELY. If it is mild, please call our office. If you take any of these medications: Glipizide/Metformin, Avandament, Glucavance, please do not take 48 hours after completing test unless otherwise instructed.  We will call to schedule your test 2-4 weeks out understanding that some insurance companies will need an authorization prior to the service being performed.   For more information and frequently asked questions, please visit our website : [email or web address]  For non-scheduling related questions, please contact the cardiac imaging nurse navigator should you have any questions/concerns: Cardiac Imaging Nurse Navigators Direct Office Dial: [phone]   For scheduling needs, including cancellations and rescheduling, please call [place], [phone].

## 2023-08-01 LAB — BASIC METABOLIC PANEL
BUN/Creatinine Ratio: 15 (ref 10–24)
BUN: 19 mg/dL (ref 8–27)
CO2: 24 mmol/L (ref 20–29)
Calcium: 8.8 mg/dL (ref 8.6–10.2)
Chloride: 101 mmol/L (ref 96–106)
Creatinine, Ser: 1.3 mg/dL — ABNORMAL HIGH (ref 0.76–1.27)
Glucose: 106 mg/dL — ABNORMAL HIGH (ref 70–99)
Potassium: 3.7 mmol/L (ref 3.5–5.2)
Sodium: 140 mmol/L (ref 134–144)
eGFR: 58 mL/min/{1.73_m2} — ABNORMAL LOW (ref >59)

## 2023-08-03 DIAGNOSIS — M6281 Muscle weakness (generalized): Secondary | ICD-10-CM | POA: Diagnosis not present

## 2023-08-03 DIAGNOSIS — I5032 Chronic diastolic (congestive) heart failure: Secondary | ICD-10-CM | POA: Diagnosis not present

## 2023-08-05 DIAGNOSIS — Z7901 Long term (current) use of anticoagulants: Secondary | ICD-10-CM | POA: Diagnosis not present

## 2023-08-05 DIAGNOSIS — I4891 Unspecified atrial fibrillation: Secondary | ICD-10-CM | POA: Diagnosis not present

## 2023-08-05 DIAGNOSIS — R262 Difficulty in walking, not elsewhere classified: Secondary | ICD-10-CM | POA: Diagnosis not present

## 2023-08-05 DIAGNOSIS — R0789 Other chest pain: Secondary | ICD-10-CM | POA: Diagnosis not present

## 2023-08-05 DIAGNOSIS — Z9181 History of falling: Secondary | ICD-10-CM | POA: Diagnosis not present

## 2023-08-05 DIAGNOSIS — R519 Headache, unspecified: Secondary | ICD-10-CM | POA: Diagnosis not present

## 2023-08-05 DIAGNOSIS — Z89422 Acquired absence of other left toe(s): Secondary | ICD-10-CM | POA: Diagnosis not present

## 2023-08-05 DIAGNOSIS — Z0189 Encounter for other specified special examinations: Secondary | ICD-10-CM | POA: Diagnosis not present

## 2023-08-05 DIAGNOSIS — M6281 Muscle weakness (generalized): Secondary | ICD-10-CM | POA: Diagnosis not present

## 2023-08-05 DIAGNOSIS — E039 Hypothyroidism, unspecified: Secondary | ICD-10-CM | POA: Diagnosis not present

## 2023-08-05 DIAGNOSIS — I5032 Chronic diastolic (congestive) heart failure: Secondary | ICD-10-CM | POA: Diagnosis not present

## 2023-08-06 ENCOUNTER — Telehealth: Payer: Self-pay | Admitting: Internal Medicine

## 2023-08-06 NOTE — Telephone Encounter (Signed)
Lacinda Axon called to confirm patient's cardiac CT. She is wanting to know what time the patient has to be there on the 24th so that transportation can bring him. Please call main number to office, [phone]

## 2023-08-06 NOTE — Telephone Encounter (Signed)
I called and spoke with Nicholas [name] at [facility].  She is aware that the patient will need to arrive:  Thursday 10/24 at 12:15 pm at the [facility] for his Cardiac CT.  [name] voices understanding of the date/ time/ location/ pre-test instructions.  She was very appreciate of the call back.

## 2023-08-07 ENCOUNTER — Telehealth (HOSPITAL_COMMUNITY): Payer: Self-pay | Admitting: *Deleted

## 2023-08-07 ENCOUNTER — Other Ambulatory Visit (HOSPITAL_COMMUNITY): Payer: Self-pay | Admitting: *Deleted

## 2023-08-07 DIAGNOSIS — M6281 Muscle weakness (generalized): Secondary | ICD-10-CM | POA: Diagnosis not present

## 2023-08-07 DIAGNOSIS — I5032 Chronic diastolic (congestive) heart failure: Secondary | ICD-10-CM | POA: Diagnosis not present

## 2023-08-07 DIAGNOSIS — I77810 Thoracic aortic ectasia: Secondary | ICD-10-CM

## 2023-08-07 NOTE — Telephone Encounter (Signed)
Attempted to call patient Lacinda Axon) regarding upcoming cardiac CT appointment. No answer and unable to leave VM. Johney Frame RN Navigator Cardiac Imaging [facility] [phone] Office

## 2023-08-08 ENCOUNTER — Ambulatory Visit
Admission: RE | Admit: 2023-08-08 | Discharge: 2023-08-08 | Disposition: A | Discharge status: Home / Self Care | Payer: 59 | Source: Ambulatory Visit | Attending: Internal Medicine | Admitting: Internal Medicine

## 2023-08-08 DIAGNOSIS — R072 Precordial pain: Secondary | ICD-10-CM | POA: Diagnosis not present

## 2023-08-08 DIAGNOSIS — I77810 Thoracic aortic ectasia: Secondary | ICD-10-CM | POA: Diagnosis not present

## 2023-08-08 DIAGNOSIS — M6281 Muscle weakness (generalized): Secondary | ICD-10-CM | POA: Diagnosis not present

## 2023-08-08 DIAGNOSIS — N281 Cyst of kidney, acquired: Secondary | ICD-10-CM | POA: Diagnosis not present

## 2023-08-08 DIAGNOSIS — I2510 Atherosclerotic heart disease of native coronary artery without angina pectoris: Secondary | ICD-10-CM | POA: Diagnosis not present

## 2023-08-08 DIAGNOSIS — R0789 Other chest pain: Secondary | ICD-10-CM | POA: Diagnosis not present

## 2023-08-08 DIAGNOSIS — I5032 Chronic diastolic (congestive) heart failure: Secondary | ICD-10-CM | POA: Diagnosis not present

## 2023-08-08 MED ORDER — NITROGLYCERIN 0.4 MG SL SUBL
0.8000 mg | SUBLINGUAL_TABLET | Freq: Once | SUBLINGUAL | Status: AC
  Administered 2023-08-08: 0.8 mg via SUBLINGUAL

## 2023-08-08 MED ORDER — IOHEXOL 350 MG/ML SOLN
150.0000 mL | Freq: Once | INTRAVENOUS | Status: AC | PRN
  Administered 2023-08-08: 150 mL via INTRAVENOUS

## 2023-08-08 MED ORDER — SODIUM CHLORIDE 0.9 % IV SOLN: INTRAVENOUS | Status: DC

## 2023-08-08 NOTE — Progress Notes (Signed)
Patient tolerated CT well. Drank water after. Vital signs stable encourage to drink water throughout day.Reasons explained and verbalized understanding.  

## 2023-08-09 DIAGNOSIS — I5032 Chronic diastolic (congestive) heart failure: Secondary | ICD-10-CM | POA: Diagnosis not present

## 2023-08-09 DIAGNOSIS — M6281 Muscle weakness (generalized): Secondary | ICD-10-CM | POA: Diagnosis not present

## 2023-08-10 DIAGNOSIS — I5032 Chronic diastolic (congestive) heart failure: Secondary | ICD-10-CM | POA: Diagnosis not present

## 2023-08-10 DIAGNOSIS — M6281 Muscle weakness (generalized): Secondary | ICD-10-CM | POA: Diagnosis not present

## 2023-08-12 DIAGNOSIS — I5032 Chronic diastolic (congestive) heart failure: Secondary | ICD-10-CM | POA: Diagnosis not present

## 2023-08-12 DIAGNOSIS — M6281 Muscle weakness (generalized): Secondary | ICD-10-CM | POA: Diagnosis not present

## 2023-08-14 DIAGNOSIS — I5032 Chronic diastolic (congestive) heart failure: Secondary | ICD-10-CM | POA: Diagnosis not present

## 2023-08-14 DIAGNOSIS — M6281 Muscle weakness (generalized): Secondary | ICD-10-CM | POA: Diagnosis not present

## 2023-08-15 DIAGNOSIS — M6281 Muscle weakness (generalized): Secondary | ICD-10-CM | POA: Diagnosis not present

## 2023-08-15 DIAGNOSIS — I5032 Chronic diastolic (congestive) heart failure: Secondary | ICD-10-CM | POA: Diagnosis not present

## 2023-08-16 DIAGNOSIS — M6281 Muscle weakness (generalized): Secondary | ICD-10-CM | POA: Diagnosis not present

## 2023-08-16 DIAGNOSIS — I5032 Chronic diastolic (congestive) heart failure: Secondary | ICD-10-CM | POA: Diagnosis not present

## 2023-08-19 DIAGNOSIS — M6281 Muscle weakness (generalized): Secondary | ICD-10-CM | POA: Diagnosis not present

## 2023-08-19 DIAGNOSIS — I5032 Chronic diastolic (congestive) heart failure: Secondary | ICD-10-CM | POA: Diagnosis not present

## 2023-08-21 DIAGNOSIS — M6281 Muscle weakness (generalized): Secondary | ICD-10-CM | POA: Diagnosis not present

## 2023-08-21 DIAGNOSIS — I5032 Chronic diastolic (congestive) heart failure: Secondary | ICD-10-CM | POA: Diagnosis not present

## 2023-08-22 DIAGNOSIS — I5032 Chronic diastolic (congestive) heart failure: Secondary | ICD-10-CM | POA: Diagnosis not present

## 2023-08-22 DIAGNOSIS — M6281 Muscle weakness (generalized): Secondary | ICD-10-CM | POA: Diagnosis not present

## 2023-08-23 DIAGNOSIS — M6281 Muscle weakness (generalized): Secondary | ICD-10-CM | POA: Diagnosis not present

## 2023-08-23 DIAGNOSIS — I5032 Chronic diastolic (congestive) heart failure: Secondary | ICD-10-CM | POA: Diagnosis not present

## 2023-08-26 DIAGNOSIS — M6281 Muscle weakness (generalized): Secondary | ICD-10-CM | POA: Diagnosis not present

## 2023-08-26 DIAGNOSIS — I5032 Chronic diastolic (congestive) heart failure: Secondary | ICD-10-CM | POA: Diagnosis not present

## 2023-08-27 DIAGNOSIS — M6281 Muscle weakness (generalized): Secondary | ICD-10-CM | POA: Diagnosis not present

## 2023-08-27 DIAGNOSIS — I5032 Chronic diastolic (congestive) heart failure: Secondary | ICD-10-CM | POA: Diagnosis not present

## 2023-08-28 DIAGNOSIS — I5032 Chronic diastolic (congestive) heart failure: Secondary | ICD-10-CM | POA: Diagnosis not present

## 2023-08-28 DIAGNOSIS — M6281 Muscle weakness (generalized): Secondary | ICD-10-CM | POA: Diagnosis not present

## 2023-08-29 DIAGNOSIS — M6281 Muscle weakness (generalized): Secondary | ICD-10-CM | POA: Diagnosis not present

## 2023-08-29 DIAGNOSIS — I5032 Chronic diastolic (congestive) heart failure: Secondary | ICD-10-CM | POA: Diagnosis not present

## 2023-08-30 DIAGNOSIS — M6281 Muscle weakness (generalized): Secondary | ICD-10-CM | POA: Diagnosis not present

## 2023-08-30 DIAGNOSIS — I5032 Chronic diastolic (congestive) heart failure: Secondary | ICD-10-CM | POA: Diagnosis not present

## 2023-09-11 DIAGNOSIS — Z9189 Other specified personal risk factors, not elsewhere classified: Secondary | ICD-10-CM | POA: Diagnosis not present

## 2023-09-11 DIAGNOSIS — K5790 Diverticulosis of intestine, part unspecified, without perforation or abscess without bleeding: Secondary | ICD-10-CM | POA: Diagnosis not present

## 2023-09-11 DIAGNOSIS — K219 Gastro-esophageal reflux disease without esophagitis: Secondary | ICD-10-CM | POA: Diagnosis not present

## 2023-09-11 DIAGNOSIS — K589 Irritable bowel syndrome without diarrhea: Secondary | ICD-10-CM | POA: Diagnosis not present

## 2023-09-23 DIAGNOSIS — R109 Unspecified abdominal pain: Secondary | ICD-10-CM | POA: Diagnosis not present

## 2023-09-23 DIAGNOSIS — R110 Nausea: Secondary | ICD-10-CM | POA: Diagnosis not present

## 2023-09-23 DIAGNOSIS — K5790 Diverticulosis of intestine, part unspecified, without perforation or abscess without bleeding: Secondary | ICD-10-CM | POA: Diagnosis not present

## 2023-09-24 DIAGNOSIS — R188 Other ascites: Secondary | ICD-10-CM | POA: Diagnosis not present

## 2023-09-24 DIAGNOSIS — R109 Unspecified abdominal pain: Secondary | ICD-10-CM | POA: Diagnosis not present

## 2023-09-25 DIAGNOSIS — N390 Urinary tract infection, site not specified: Secondary | ICD-10-CM | POA: Diagnosis not present

## 2023-09-25 DIAGNOSIS — R300 Dysuria: Secondary | ICD-10-CM | POA: Diagnosis not present

## 2023-09-25 DIAGNOSIS — D649 Anemia, unspecified: Secondary | ICD-10-CM | POA: Diagnosis not present

## 2023-09-25 DIAGNOSIS — I10 Essential (primary) hypertension: Secondary | ICD-10-CM | POA: Diagnosis not present

## 2023-09-27 DIAGNOSIS — N390 Urinary tract infection, site not specified: Secondary | ICD-10-CM | POA: Diagnosis not present

## 2023-09-27 DIAGNOSIS — D649 Anemia, unspecified: Secondary | ICD-10-CM | POA: Diagnosis not present

## 2023-09-27 DIAGNOSIS — K589 Irritable bowel syndrome without diarrhea: Secondary | ICD-10-CM | POA: Diagnosis not present

## 2023-09-27 DIAGNOSIS — I959 Hypotension, unspecified: Secondary | ICD-10-CM | POA: Diagnosis not present

## 2023-09-27 DIAGNOSIS — R935 Abnormal findings on diagnostic imaging of other abdominal regions, including retroperitoneum: Secondary | ICD-10-CM | POA: Diagnosis not present

## 2023-09-27 DIAGNOSIS — K219 Gastro-esophageal reflux disease without esophagitis: Secondary | ICD-10-CM | POA: Diagnosis not present

## 2023-09-27 DIAGNOSIS — R300 Dysuria: Secondary | ICD-10-CM | POA: Diagnosis not present

## 2023-09-27 DIAGNOSIS — I10 Essential (primary) hypertension: Secondary | ICD-10-CM | POA: Diagnosis not present

## 2023-09-27 DIAGNOSIS — R109 Unspecified abdominal pain: Secondary | ICD-10-CM | POA: Diagnosis not present

## 2023-09-28 DIAGNOSIS — N390 Urinary tract infection, site not specified: Secondary | ICD-10-CM | POA: Diagnosis not present

## 2023-09-28 DIAGNOSIS — I10 Essential (primary) hypertension: Secondary | ICD-10-CM | POA: Diagnosis not present

## 2023-09-28 DIAGNOSIS — D649 Anemia, unspecified: Secondary | ICD-10-CM | POA: Diagnosis not present

## 2023-09-28 DIAGNOSIS — R300 Dysuria: Secondary | ICD-10-CM | POA: Diagnosis not present

## 2023-10-02 ENCOUNTER — Other Ambulatory Visit (HOSPITAL_COMMUNITY): Payer: Self-pay | Admitting: Family Medicine

## 2023-10-02 DIAGNOSIS — R109 Unspecified abdominal pain: Secondary | ICD-10-CM

## 2023-10-03 ENCOUNTER — Ambulatory Visit: Payer: Medicare Other | Admitting: Internal Medicine

## 2023-10-07 ENCOUNTER — Ambulatory Visit (HOSPITAL_COMMUNITY)
Admission: RE | Admit: 2023-10-07 | Discharge: 2023-10-07 | Disposition: A | Discharge status: Home / Self Care | Payer: 59 | Source: Ambulatory Visit | Attending: Family Medicine | Admitting: Family Medicine

## 2023-10-07 ENCOUNTER — Encounter (HOSPITAL_COMMUNITY): Payer: Self-pay

## 2023-10-07 DIAGNOSIS — K5730 Diverticulosis of large intestine without perforation or abscess without bleeding: Secondary | ICD-10-CM | POA: Diagnosis not present

## 2023-10-07 DIAGNOSIS — K921 Melena: Secondary | ICD-10-CM | POA: Diagnosis not present

## 2023-10-07 DIAGNOSIS — R109 Unspecified abdominal pain: Secondary | ICD-10-CM | POA: Diagnosis not present

## 2023-10-07 DIAGNOSIS — R932 Abnormal findings on diagnostic imaging of liver and biliary tract: Secondary | ICD-10-CM | POA: Diagnosis not present

## 2023-10-07 DIAGNOSIS — R1031 Right lower quadrant pain: Secondary | ICD-10-CM | POA: Diagnosis not present

## 2023-10-18 DIAGNOSIS — J849 Interstitial pulmonary disease, unspecified: Secondary | ICD-10-CM | POA: Diagnosis not present

## 2023-10-18 DIAGNOSIS — R935 Abnormal findings on diagnostic imaging of other abdominal regions, including retroperitoneum: Secondary | ICD-10-CM | POA: Diagnosis not present

## 2023-10-18 DIAGNOSIS — R109 Unspecified abdominal pain: Secondary | ICD-10-CM | POA: Diagnosis not present

## 2023-10-18 DIAGNOSIS — K580 Irritable bowel syndrome with diarrhea: Secondary | ICD-10-CM | POA: Diagnosis not present

## 2023-10-23 ENCOUNTER — Encounter: Payer: Self-pay | Admitting: Podiatry

## 2023-10-23 ENCOUNTER — Ambulatory Visit (INDEPENDENT_AMBULATORY_CARE_PROVIDER_SITE_OTHER): Payer: 59 | Admitting: Podiatry

## 2023-10-23 DIAGNOSIS — D689 Coagulation defect, unspecified: Secondary | ICD-10-CM

## 2023-10-23 DIAGNOSIS — E1142 Type 2 diabetes mellitus with diabetic polyneuropathy: Secondary | ICD-10-CM

## 2023-10-23 DIAGNOSIS — B351 Tinea unguium: Secondary | ICD-10-CM | POA: Diagnosis not present

## 2023-10-23 DIAGNOSIS — M79675 Pain in left toe(s): Secondary | ICD-10-CM | POA: Diagnosis not present

## 2023-10-23 DIAGNOSIS — M79674 Pain in right toe(s): Secondary | ICD-10-CM

## 2023-10-23 NOTE — Progress Notes (Signed)
 Subjective:  Patient ID: Nicholas [name], male    DOB: 05/28/1951,   MRN: [id number]  Chief Complaint  Patient presents with   Nail Problem    rfc    73 y.o. male presents for concern of thickened elongated and painful nails that are difficult to trim. Requesting to have them trimmed today. Relates burning and tingling in their feet. Patient relates he was recently diangosed with diabetes and unclear from chart. No recent A1c. He is on eliquis  and at risk for routine foot care  PCP:  [name], MD    . Denies any other pedal complaints. Denies n/v/f/c.   Past Medical History:  Diagnosis Date   Acute kidney failure 08/2008   cleared up  no problems since   Acute meningitis 11/06/2022   Allergy    Anxiety    Arthritis    Asthma    ?of this no inhaler   Atrial fibrillation (HCC)    Blood transfusion    CHF (congestive heart failure) (HCC)    ?of this, pt denies   CLOSTRIDIUM DIFFICILE COLITIS 07/04/2010   Annotation: 12/09, 2/10 Qualifier: Diagnosis of  By: [name], [name]     COVID-19 08/12/22 completed Paxlovid  08/20/22 08/26/2022   Depression with anxiety    Duodenitis determined by biopsy 02/2016   peptic likely due to aleve (erosive gastropathy with duodenal erosions)   ECZEMA 07/04/2010   Qualifier: Diagnosis of  By: [name], [name]     Elevated liver enzymes    Fatty liver    Full dentures    GERD (gastroesophageal reflux disease)    Heart attack (HCC)    08/2008 (likely demand ischemia in the setting of MSSA sepsis/R TKA  infection)10-2008   History of hiatal hernia    History of stomach ulcers    Hypothyroidism    Lower GI bleeding    MALAR AND MAXILLARY BONES CLOSED FRACTURE 07/04/2010   Annotation: ORIF Qualifier: Diagnosis of  By: [name], [name]     Migraine    definitely   MRSA (methicillin resistant Staphylococcus aureus)    in leg, had to place steel rod in leg   Personal history of colonic adenoma 06/01/2003   PFO (patent foramen  ovale)    small PFO by 08/2008 TEE   Pneumonia 08/2008   while in ICU   Pneumonia due to COVID-19 virus 08/12/2022   Seizures (HCC) 2009   long time ago     Sleep apnea    does not wear CPAP   Stroke (HCC) 2010   unable to complete sentences at times   Wears glasses     Objective:  Physical Exam: Vascular: DP/PT pulses 2/4 bilateral. CFT <3 seconds. Absent hair growth on digits. Edema noted to bilateral lower extremities. Xerosis noted bilaterally.  Skin. No lacerations or abrasions bilateral feet. Nails 3-5 bilateral  and 1,2 on right are thickened discolored and elongated with subungual debris. Hyperkeratotic lesion noted sub first metatarsal head on left.  Musculoskeletal: MMT 5/5 bilateral lower extremities in DF, PF, Inversion and Eversion. Deceased ROM in DF of ankle joint. Left hallux and second digit amputated.  Neurological: Sensation intact to light touch. Protective sensation diminished bilateral.    Assessment:   1. Pain due to onychomycosis of toenails of both feet   2. Coagulation defect (HCC)      Plan:  Patient was evaluated and treated and all questions answered. -Discussed and educated patient on diabetic foot care, especially with  regards  to the vascular, neurological and musculoskeletal systems.  -Stressed the importance of good glycemic control and the detriment of not  controlling glucose levels in relation to the foot. -Discussed supportive shoes at all times and checking feet regularly.  -Mechanically debrided all nails 1-5 bilateral using sterile nail nipper and filed with dremel without incident t  -Answered all patient questions -Patient to return  in 3 months for at risk foot care -Patient advised to call the office if any problems or questions arise in the meantime.   Asberry Failing, DPM

## 2023-10-25 DIAGNOSIS — D508 Other iron deficiency anemias: Secondary | ICD-10-CM | POA: Diagnosis not present

## 2023-10-25 DIAGNOSIS — Z79899 Other long term (current) drug therapy: Secondary | ICD-10-CM | POA: Diagnosis not present

## 2023-10-25 DIAGNOSIS — R945 Abnormal results of liver function studies: Secondary | ICD-10-CM | POA: Diagnosis not present

## 2023-10-25 DIAGNOSIS — I5020 Unspecified systolic (congestive) heart failure: Secondary | ICD-10-CM | POA: Diagnosis not present

## 2023-10-25 DIAGNOSIS — I10 Essential (primary) hypertension: Secondary | ICD-10-CM | POA: Diagnosis not present

## 2023-11-01 DIAGNOSIS — R82998 Other abnormal findings in urine: Secondary | ICD-10-CM | POA: Diagnosis not present

## 2023-11-01 DIAGNOSIS — R0989 Other specified symptoms and signs involving the circulatory and respiratory systems: Secondary | ICD-10-CM | POA: Diagnosis not present

## 2023-11-01 DIAGNOSIS — Z79899 Other long term (current) drug therapy: Secondary | ICD-10-CM | POA: Diagnosis not present

## 2023-11-01 DIAGNOSIS — M47816 Spondylosis without myelopathy or radiculopathy, lumbar region: Secondary | ICD-10-CM | POA: Diagnosis not present

## 2023-11-01 DIAGNOSIS — K529 Noninfective gastroenteritis and colitis, unspecified: Secondary | ICD-10-CM | POA: Diagnosis not present

## 2023-11-06 ENCOUNTER — Telehealth: Payer: Self-pay | Admitting: Neurology

## 2023-11-06 NOTE — Telephone Encounter (Signed)
Call to [facility], left message to return call

## 2023-11-06 NOTE — Telephone Encounter (Signed)
Received faxed note from [facility] Dr. Blenda Mounts. "Verify whether pt has a neurologist to manage seizures. His Depakote level is low, lab should be sent to Neurologist" Labs sent state Depakote level is 26.0. Someone from there did call yesterday and asked and I advised we do see patient here, but it looks like we prescribe Depakote for migraines. Has hx of seizures but don't see that they have been an issue for a long time. Please advise, thank you Seizures (HCC) 2009     "long time ago"

## 2023-11-06 NOTE — Telephone Encounter (Signed)
Dr. Terrace Arabia started him on Depakote for migraine management.  Would not recommend dosing the medication based on blood level since treating for migraine headaches. Thanks

## 2023-11-07 DIAGNOSIS — M6281 Muscle weakness (generalized): Secondary | ICD-10-CM | POA: Diagnosis not present

## 2023-11-07 DIAGNOSIS — R2681 Unsteadiness on feet: Secondary | ICD-10-CM | POA: Diagnosis not present

## 2023-11-07 DIAGNOSIS — R262 Difficulty in walking, not elsewhere classified: Secondary | ICD-10-CM | POA: Diagnosis not present

## 2023-11-07 DIAGNOSIS — I739 Peripheral vascular disease, unspecified: Secondary | ICD-10-CM | POA: Diagnosis not present

## 2023-11-08 DIAGNOSIS — D500 Iron deficiency anemia secondary to blood loss (chronic): Secondary | ICD-10-CM | POA: Diagnosis not present

## 2023-11-08 DIAGNOSIS — R262 Difficulty in walking, not elsewhere classified: Secondary | ICD-10-CM | POA: Diagnosis not present

## 2023-11-08 DIAGNOSIS — R2681 Unsteadiness on feet: Secondary | ICD-10-CM | POA: Diagnosis not present

## 2023-11-08 DIAGNOSIS — M6281 Muscle weakness (generalized): Secondary | ICD-10-CM | POA: Diagnosis not present

## 2023-11-08 DIAGNOSIS — I739 Peripheral vascular disease, unspecified: Secondary | ICD-10-CM | POA: Diagnosis not present

## 2023-11-11 DIAGNOSIS — M6281 Muscle weakness (generalized): Secondary | ICD-10-CM | POA: Diagnosis not present

## 2023-11-11 DIAGNOSIS — R262 Difficulty in walking, not elsewhere classified: Secondary | ICD-10-CM | POA: Diagnosis not present

## 2023-11-11 DIAGNOSIS — R2681 Unsteadiness on feet: Secondary | ICD-10-CM | POA: Diagnosis not present

## 2023-11-11 DIAGNOSIS — I739 Peripheral vascular disease, unspecified: Secondary | ICD-10-CM | POA: Diagnosis not present

## 2023-11-12 DIAGNOSIS — R2681 Unsteadiness on feet: Secondary | ICD-10-CM | POA: Diagnosis not present

## 2023-11-12 DIAGNOSIS — R262 Difficulty in walking, not elsewhere classified: Secondary | ICD-10-CM | POA: Diagnosis not present

## 2023-11-12 DIAGNOSIS — M6281 Muscle weakness (generalized): Secondary | ICD-10-CM | POA: Diagnosis not present

## 2023-11-12 DIAGNOSIS — I739 Peripheral vascular disease, unspecified: Secondary | ICD-10-CM | POA: Diagnosis not present

## 2023-11-13 DIAGNOSIS — I739 Peripheral vascular disease, unspecified: Secondary | ICD-10-CM | POA: Diagnosis not present

## 2023-11-13 DIAGNOSIS — M6281 Muscle weakness (generalized): Secondary | ICD-10-CM | POA: Diagnosis not present

## 2023-11-13 DIAGNOSIS — R2681 Unsteadiness on feet: Secondary | ICD-10-CM | POA: Diagnosis not present

## 2023-11-13 DIAGNOSIS — R262 Difficulty in walking, not elsewhere classified: Secondary | ICD-10-CM | POA: Diagnosis not present

## 2023-11-13 NOTE — Telephone Encounter (Signed)
Called and spoke with [place] at [phone],WGNFAOZH level 26. advised depakote was used for this patients migraine management. She stated she would inform Dr. Harrison Mons.

## 2023-11-14 DIAGNOSIS — R2681 Unsteadiness on feet: Secondary | ICD-10-CM | POA: Diagnosis not present

## 2023-11-14 DIAGNOSIS — M6281 Muscle weakness (generalized): Secondary | ICD-10-CM | POA: Diagnosis not present

## 2023-11-14 DIAGNOSIS — I739 Peripheral vascular disease, unspecified: Secondary | ICD-10-CM | POA: Diagnosis not present

## 2023-11-14 DIAGNOSIS — R262 Difficulty in walking, not elsewhere classified: Secondary | ICD-10-CM | POA: Diagnosis not present

## 2023-11-15 DIAGNOSIS — R262 Difficulty in walking, not elsewhere classified: Secondary | ICD-10-CM | POA: Diagnosis not present

## 2023-11-15 DIAGNOSIS — R2681 Unsteadiness on feet: Secondary | ICD-10-CM | POA: Diagnosis not present

## 2023-11-15 DIAGNOSIS — I739 Peripheral vascular disease, unspecified: Secondary | ICD-10-CM | POA: Diagnosis not present

## 2023-11-15 DIAGNOSIS — M6281 Muscle weakness (generalized): Secondary | ICD-10-CM | POA: Diagnosis not present

## 2023-11-18 ENCOUNTER — Ambulatory Visit: Payer: 59

## 2023-11-18 DIAGNOSIS — M6281 Muscle weakness (generalized): Secondary | ICD-10-CM | POA: Diagnosis not present

## 2023-11-18 DIAGNOSIS — E1142 Type 2 diabetes mellitus with diabetic polyneuropathy: Secondary | ICD-10-CM

## 2023-11-18 DIAGNOSIS — M2041 Other hammer toe(s) (acquired), right foot: Secondary | ICD-10-CM

## 2023-11-18 DIAGNOSIS — L84 Corns and callosities: Secondary | ICD-10-CM

## 2023-11-18 DIAGNOSIS — R2681 Unsteadiness on feet: Secondary | ICD-10-CM | POA: Diagnosis not present

## 2023-11-18 DIAGNOSIS — M2141 Flat foot [pes planus] (acquired), right foot: Secondary | ICD-10-CM

## 2023-11-18 DIAGNOSIS — R262 Difficulty in walking, not elsewhere classified: Secondary | ICD-10-CM | POA: Diagnosis not present

## 2023-11-18 DIAGNOSIS — I739 Peripheral vascular disease, unspecified: Secondary | ICD-10-CM | POA: Diagnosis not present

## 2023-11-18 DIAGNOSIS — Z899 Acquired absence of limb, unspecified: Secondary | ICD-10-CM

## 2023-11-18 NOTE — Progress Notes (Signed)
Patient presents to the office today for diabetic shoe and insole measuring.  Patient was measured with brannock for one pr diabetic shoes Left toefiller 1ea 1st and 2nd amputation Right custom inserts 3ea  Documentation of medical necessity will be sent to patient's treating diabetic doctor to verify and sign.   Patient's diabetic provider: Jenny Reichmann MD   Shoes and insoles will be ordered at that time and patient will be notified for an appointment for fitting when they arrive.   Shoe size (per patient): 14 [place] measurement: 12 Shoe choice:   V854M / [id number] Shoe size ordered: 13XWD Ppw / ABN signed  Addison Bailey CPed, CFo, CFm,

## 2023-11-19 DIAGNOSIS — R2681 Unsteadiness on feet: Secondary | ICD-10-CM | POA: Diagnosis not present

## 2023-11-19 DIAGNOSIS — I739 Peripheral vascular disease, unspecified: Secondary | ICD-10-CM | POA: Diagnosis not present

## 2023-11-19 DIAGNOSIS — R262 Difficulty in walking, not elsewhere classified: Secondary | ICD-10-CM | POA: Diagnosis not present

## 2023-11-19 DIAGNOSIS — M6281 Muscle weakness (generalized): Secondary | ICD-10-CM | POA: Diagnosis not present

## 2023-11-20 DIAGNOSIS — M6281 Muscle weakness (generalized): Secondary | ICD-10-CM | POA: Diagnosis not present

## 2023-11-20 DIAGNOSIS — I739 Peripheral vascular disease, unspecified: Secondary | ICD-10-CM | POA: Diagnosis not present

## 2023-11-20 DIAGNOSIS — R262 Difficulty in walking, not elsewhere classified: Secondary | ICD-10-CM | POA: Diagnosis not present

## 2023-11-20 DIAGNOSIS — R2681 Unsteadiness on feet: Secondary | ICD-10-CM | POA: Diagnosis not present

## 2023-11-21 ENCOUNTER — Encounter: Payer: Self-pay | Admitting: Neurology

## 2023-11-21 ENCOUNTER — Ambulatory Visit: Payer: 59 | Admitting: Neurology

## 2023-11-21 VITALS — BP 110/73 | HR 85 | Ht 72.0 in | Wt 262.0 lb

## 2023-11-21 DIAGNOSIS — D352 Benign neoplasm of pituitary gland: Secondary | ICD-10-CM | POA: Diagnosis not present

## 2023-11-21 DIAGNOSIS — G43109 Migraine with aura, not intractable, without status migrainosus: Secondary | ICD-10-CM

## 2023-11-21 DIAGNOSIS — R2681 Unsteadiness on feet: Secondary | ICD-10-CM | POA: Diagnosis not present

## 2023-11-21 DIAGNOSIS — R262 Difficulty in walking, not elsewhere classified: Secondary | ICD-10-CM | POA: Diagnosis not present

## 2023-11-21 DIAGNOSIS — I739 Peripheral vascular disease, unspecified: Secondary | ICD-10-CM | POA: Diagnosis not present

## 2023-11-21 DIAGNOSIS — M6281 Muscle weakness (generalized): Secondary | ICD-10-CM | POA: Diagnosis not present

## 2023-11-21 MED ORDER — NURTEC 75 MG PO TBDP: 75.0000 mg | ORAL_TABLET | ORAL | 11 refills | Status: DC | PRN

## 2023-11-21 MED ORDER — DIVALPROEX SODIUM ER 500 MG PO TB24: 500.0000 mg | ORAL_TABLET | Freq: Every day | ORAL | 3 refills | Status: DC

## 2023-11-21 NOTE — Progress Notes (Addendum)
 HISTORICAL  Nicholas [name] is a 73 year old male, seen in request by his primary care physician Dr. Rilla Baller no frequent headaches, initial evaluation was on September 29, 2020.  I reviewed and summarized the referring note. hypothyroidism, on supplement,  HLD CHF CAD, Stroke in 2018, right side weakness Right knee replacement MRSA in 2009, surgery in 2010,  Left ulnar nerve transposition surgery  Bilateral shoulder replacement.  He reported a history of migraine headaches since 73 years old, his typical migraine is holoacranial severe pounding headache with associated light, noise sensitivity, nauseous, over the years, he was treated with different over-the-counter medications, and triptan treatment without significant improvement, in his 40s, he was put on different preventive medications as well, there was no significant change per patient  He reported a history of heart attack, stroke, also had a history of difficult right knee replacement, complicated by prolonged MRSA infection, require prolonged antibiotic treatment.  He now complains of 1 to twice migraine headache each week, bilateral temporal retro-orbital region, sharp, moderate severe pain, with associated light, noise sensitivity, nauseous, lasting 1 to few days, Imitrex  100 mg provides limited help,  I personally reviewed MRI of the brain with and without contrast in November 2021, 10 mm hypoenhancing lesion within the left side of the pituitary gland likely representing a pituitary micro adenoma. Mild deviation of the pituitary stalk to the right. No suprasellar component.  Mild supratentorium small vessel disease,  He denies auras with his migraine  Update January 05, 2021 SS: Here today alone, lives in senior living apartment. Added Depakote  ER 500 mg at bedtime last time, remains on gabapentin , metoprolol . Migraines used to be 1-2 a week, now 1 every 2-3 weeks. The Ubrelvy  works quickly, takes Zofran , tizanidine .   Labs reviewed from PCP February 2022, CMP looked okay.   Update January 16, 2022 SS: Here today alone, stopped the Depakote , unsure why or when (Dec 2022 note from pharmacy he was off > 6 months wasn't taking correctly). Right now 2-3 migraines a month, takes Tylenol  for headache without help, lays down, can take a few days to pass. On amitriptyline  for sleep; also gabapentin , Toprol -XL that could benefit migraines . Right now allergies trigger migraines. CT head in May 2022 no acute problem, stable. Seeing Dr. Harden about foot ulcer left, may need amputation of great toe.   Update July 24, 2022 SS: Taking Depakote  ER 500 mg daily for migraine, is about 50% helpful. Still having about 2-3 migraines a month, can last a few days. The Ubrelvy  helps for few hours, then returns, clusters over a few days. Reports he has noted occasional body jerks since being on Depakote , side effect? No tremor was noted.  Update November 20, 2022 SS: Here with his sister, is now at SNF at [facility], due to multiple falls. Trying to get placement at long term care facility. AFIB with cardioversion Nov 2023, COVID oct 2023, bacteremia in November, Flu in December. Has not had any migraines, it is still on his medication list. Jan 2024, negative for meningitis. He feels well now, feels the best he has felt.   Update November 21, 2023 SS: Remains at [facility], his sister brought him. Remains on Depakote  ER 500 mg at bedtime for migraines. Having about 3 migraines a month. Feels happy with migraine control. Doesn't think Ubrelvy  works for him, will ask for Tylenol . MRI brain November 2021 showed 10 mm hypo enhancing lesion left side of pituitary gland likely microadenoma. No side effect  from Depakote .   REVIEW OF SYSTEMS: Full 14 system review of systems performed and notable only for as above.  See HPI  ALLERGIES: Allergies  Allergen Reactions   Peanut-Containing Drug Products Anaphylaxis and Dermatitis   Yellow Jacket  Venom [Bee Venom] Anaphylaxis and Other (See Comments)    Respiratory Distress   Celebrex [Celecoxib] Other (See Comments)    BLACK STOOL?MELENA?   Percocet [Oxycodone -Acetaminophen ] Hives, Swelling and Other (See Comments)   Latex Rash   Nsaids Rash    HOME MEDICATIONS: Current Outpatient Medications  Medication Sig Dispense Refill   acetaminophen  (TYLENOL ) 500 MG tablet Take 1,000 mg by mouth 3 (three) times daily.     amitriptyline  (ELAVIL ) 50 MG tablet Take 0.5 tablets (25 mg total) by mouth in the morning and at bedtime.     apixaban  (ELIQUIS ) 5 MG TABS tablet Take 1 tablet (5 mg total) by mouth 2 (two) times daily. 60 tablet 3   atorvastatin  (LIPITOR) 20 MG tablet TAKE ONE TABLET BY MOUTH ONCE DAILY (Patient taking differently: Take 10 mg by mouth at bedtime.) 90 tablet 3   azelastine (ASTELIN) 0.1 % nasal spray Place 2 sprays into both nostrils daily. Use in each nostril as directed     cetirizine  (ZYRTEC ) 10 MG tablet Take 1 tablet (10 mg total) by mouth daily. 30 tablet 0   Cholecalciferol  (VITAMIN D3) 25 MCG (1000 UT) CAPS Take 1 capsule (1,000 Units total) by mouth daily. 90 capsule 1   cyanocobalamin  (VITAMIN B12) 1000 MCG tablet Take 1 tablet (1,000 mcg total) by mouth daily.     divalproex  (DEPAKOTE  ER) 500 MG 24 hr tablet TAKE ONE TABLET BY MOUTH EVERYDAY AT BEDTIME 30 tablet 5   EPINEPHrine  0.3 mg/0.3 mL IJ SOAJ injection Inject 0.3 mg into the muscle as needed for anaphylaxis. (bee stings) 2 each 1   ferrous sulfate  325 (65 FE) MG EC tablet Take 325 mg by mouth daily with breakfast.     furosemide  (LASIX ) 20 MG tablet Take 40 mg by mouth daily.     gabapentin  (NEURONTIN ) 300 MG capsule Take 600 mg by mouth 3 (three) times daily. 300 mg in the am, 600 mg in the afternoon, and 600 mg in the evening     levothyroxine  (SYNTHROID ) 125 MCG tablet Take 1 tablet (125 mcg total) by mouth daily before breakfast. 90 tablet 4   Menthol , Topical Analgesic, (BIOFREEZE) 4 % GEL Apply 1  Application topically daily. Neck pain     metoprolol  succinate (TOPROL -XL) 100 MG 24 hr tablet Take 1 tablet (100 mg total) by mouth at bedtime. Take with or immediately following a meal.     metoprolol  tartrate (LOPRESSOR ) 100 MG tablet TAKE 1 TABLET 2 HR PRIOR TO CARDIAC PROCEDURE 1 tablet 0   nitroGLYCERIN  (NITROSTAT ) 0.4 MG SL tablet Place 1 tablet (0.4 mg total) under the tongue every 5 (five) minutes as needed for chest pain. 20 tablet 3   omeprazole  (PRILOSEC) 40 MG capsule Take 1 capsule (40 mg total) by mouth daily. 90 capsule 3   ondansetron  (ZOFRAN -ODT) 4 MG disintegrating tablet TAKE ONE TABLET BY MOUTH every EIGHT hours AS NEEDED 20 tablet 3   Probiotic Product (PROBIOTIC DAILY) CAPS Take 1 capsule by mouth. 1 capsule in the morning every 3 days     tamsulosin  (FLOMAX ) 0.4 MG CAPS capsule TAKE ONE CAPSULE BY MOUTH ONCE DAILY 30 capsule 2   tiZANidine  (ZANAFLEX ) 4 MG tablet TAKE ONE TABLET BY MOUTH every SIX hours AS  NEEDED FOR acute HEADACHE 20 tablet 1   Ubrogepant  (UBRELVY ) 100 MG TABS Take 100 mg by mouth as needed (take 1 tablet at onset of headache, may repeat in hours, max is 200 mg in 24 hours). 12 tablet 11   No current facility-administered medications for this visit.    PAST MEDICAL HISTORY: Past Medical History:  Diagnosis Date   Acute kidney failure 08/2008   cleared up  no problems since   Acute meningitis 11/06/2022   Allergy    Anxiety    Arthritis    Asthma    ?of this no inhaler   Atrial fibrillation (HCC)    Blood transfusion    CHF (congestive heart failure) (HCC)    ?of this, pt denies   CLOSTRIDIUM DIFFICILE COLITIS 07/04/2010   Annotation: 12/09, 2/10 Qualifier: Diagnosis of  By: [name], [name]     COVID-19 08/12/22 completed Paxlovid  08/20/22 08/26/2022   Depression with anxiety    Duodenitis determined by biopsy 02/2016   peptic likely due to aleve (erosive gastropathy with duodenal erosions)   ECZEMA 07/04/2010   Qualifier: Diagnosis of   By: [name], [name]     Elevated liver enzymes    Fatty liver    Full dentures    GERD (gastroesophageal reflux disease)    Heart attack (HCC)    08/2008 (likely demand ischemia in the setting of MSSA sepsis/R TKA  infection)10-2008   History of hiatal hernia    History of stomach ulcers    Hypothyroidism    Lower GI bleeding    MALAR AND MAXILLARY BONES CLOSED FRACTURE 07/04/2010   Annotation: ORIF Qualifier: Diagnosis of  By: [name], [name]     Migraine    definitely   MRSA (methicillin resistant Staphylococcus aureus)    in leg, had to place steel rod in leg   Personal history of colonic adenoma 06/01/2003   PFO (patent foramen ovale)    small PFO by 08/2008 TEE   Pneumonia 08/2008   while in ICU   Pneumonia due to COVID-19 virus 08/12/2022   Seizures (HCC) 2009   long time ago     Sleep apnea    does not wear CPAP   Stroke (HCC) 2010   unable to complete sentences at times   Wears glasses     PAST SURGICAL HISTORY: Past Surgical History:  Procedure Laterality Date   AMPUTATION Left 01/24/2022   Procedure: LEFT GREAT TOE AMPUTATION AT METATARSOPHALANGEAL JOINT AND SECOND TOE AMPUTATION;  Surgeon: Harden Jerona GAILS, MD;  Location: [facility];  Service: Orthopedics;  Laterality: Left;   anterior  nerve transposition  07/2009   left ulnar nerve   antibiotic spacer exchange  11/2008; 08/2006   right knee   ARTHROTOMY  08/2008   right knee w/I&D   CARDIOVASCULAR STRESS TEST  2013   stress EKG - negative for ischemia, 4 min 7.3 METs, normal blood pressure response   CARDIOVERSION N/A 08/24/2022   Procedure: CARDIOVERSION;  Surgeon: Perla Evalene PARAS, MD;  Location: [facility] ORS;  Service: Cardiovascular;  Laterality: N/A;   CATARACT EXTRACTION     COLONOSCOPY  11/2012   diverticulosis, rpt 10 yrs Ollen)   ESOPHAGOGASTRODUODENOSCOPY  02/2016   erosive gastropathy with duodenal erosions Ollen)   HARDWARE REMOVAL  04/2006   right knee w/antibiotic spacers placed    INGUINAL HERNIA REPAIR  early 1990's   bilateral   JOINT REPLACEMENT Bilateral    KNEE ARTHROSCOPY  10/2001   right  KNEE FUSION  03/2009   right knee removal; antibiotic spacers;    LEFT HEART CATH AND CORONARY ANGIOGRAPHY N/A 02/04/2018    WNL (Patwardhan, Newman PARAS, MD)   LUMBAR LAMINECTOMY/DECOMPRESSION MICRODISCECTOMY N/A 12/05/2018   Procedure: LUMBAR THREE TO FOUR AND LUMBAR FOUR TO FIVE DECOMPRESSION;  Surgeon: Barbarann Oneil BROCKS, MD;  Location: [facility] OR;  Service: Orthopedics;  Laterality: N/A;   MULTIPLE TOOTH EXTRACTIONS     REPLACEMENT TOTAL KNEE  08/2008; 09/2001   right   REVERSE SHOULDER ARTHROPLASTY Left 09/03/2017   Procedure: REVERSE SHOULDER ARTHROPLASTY;  Surgeon: Addie Cordella Hamilton, MD;  Location: [facility] OR;  Service: Orthopedics;  Laterality: Left;   REVERSE SHOULDER ARTHROPLASTY Right 05/13/2018   Procedure: RIGHT REVERSE SHOULDER ARTHROPLASTY;  Surgeon: Addie Cordella Hamilton, MD;  Location: [facility] OR;  Service: Orthopedics;  Laterality: Right;   rod placement Right 03/2009   R knee   SYNOVECTOMY  06/2005   debridement, liner exchange right knee   TEE WITHOUT CARDIOVERSION N/A 08/24/2022   Procedure: TRANSESOPHAGEAL ECHOCARDIOGRAM (TEE);  Surgeon: Perla Evalene PARAS, MD;  Location: [facility] ORS;  Service: Cardiovascular;  Laterality: N/A;   TOE AMPUTATION Left 08/2008   great toe - osteomyelitis (staph infection)   TOE AMPUTATION Left 04/2016   2nd toe Billi)   TONSILLECTOMY     TOTAL KNEE ARTHROPLASTY Left 01/15/2017   TOTAL KNEE ARTHROPLASTY Left 01/15/2017   Procedure: TOTAL KNEE ARTHROPLASTY;  Surgeon: Hamilton Cordella Addie, MD;  Location: [facility] OR;  Service: Orthopedics;  Laterality: Left;    FAMILY HISTORY: Family History  Problem Relation Age of Onset   Coronary artery disease Father 38   Stroke Father    CAD Mother 72       stents   Breast cancer Sister    Diabetes Brother    Diabetes Sister    Colon cancer Neg Hx    Stomach cancer Neg Hx     SOCIAL HISTORY: Social History    Socioeconomic History   Marital status: Widowed    Spouse name: Not on file   Number of children: 2   Years of education: 63   Highest education level: Not on file  Occupational History   Occupation: retired  Tobacco Use   Smoking status: Never    Passive exposure: Past   Smokeless tobacco: Never  Vaping Use   Vaping status: Never Used  Substance and Sexual Activity   Alcohol  use: No    Alcohol /week: 0.0 standard drinks of alcohol     Comment: I abused alcohol ; last drink  ~ 2005   Drug use: No   Sexual activity: Not Currently  Other Topics Concern   Not on file  Social History Narrative   Widower 2016 - wife passed from ESRD related illness   Lives in [facility] in [place].    Mormon      Screened positive for OSA 04/2018   Social Drivers of Health   Financial Resource Strain: Low Risk  (12/11/2022)   Overall Financial Resource Strain (CARDIA)    Difficulty of Paying Living Expenses: Not hard at all  Food Insecurity: No Food Insecurity (12/11/2022)   Hunger Vital Sign    Worried About Running Out of Food in the Last Year: Never true    Ran Out of Food in the Last Year: Never true  Transportation Needs: No Transportation Needs (12/11/2022)   PRAPARE - Administrator, Civil Service (Medical): No    Lack of Transportation (Non-Medical): No  Recent  Concern: Transportation Needs - Unmet Transportation Needs (11/07/2022)   PRAPARE - Transportation    Lack of Transportation (Medical): Yes    Lack of Transportation (Non-Medical): Yes  Physical Activity: Insufficiently Active (12/11/2022)   Exercise Vital Sign    Days of Exercise per Week: 3 days    Minutes of Exercise per Session: 20 min  Stress: No Stress Concern Present (12/11/2022)   [employer] of Occupational Health - Occupational Stress Questionnaire    Feeling of Stress : Not at all  Social Connections: Moderately Isolated (12/11/2022)   Social Connection and Isolation Panel [NHANES]     Frequency of Communication with Friends and Family: More than three times a week    Frequency of Social Gatherings with Friends and Family: Once a week    Attends Religious Services: More than 4 times per year    Active Member of [employer] or Organizations: No    Attends Banker Meetings: Never    Marital Status: Widowed  Intimate Partner Violence: Not At Risk (12/11/2022)   Humiliation, Afraid, Rape, and Kick questionnaire    Fear of Current or Ex-Partner: No    Emotionally Abused: No    Physically Abused: No    Sexually Abused: No   PHYSICAL EXAM   Vitals:   11/21/23 1250  BP: 110/73  Pulse: 85  Weight: 262 lb (118.8 kg)  Height: 6' (1.829 m)   Not recorded    Body mass index is 35.53 kg/m.  Physical Exam  General: The patient is alert and cooperative at the time of the examination.  Neurologic Exam  Mental status: The patient is alert and oriented x 3 at the time of the examination. The patient has apparent normal recent and remote memory, with an apparently normal attention span and concentration ability.  Cranial nerves: Facial symmetry is present. Speech is normal, no aphasia or dysarthria is noted. Extraocular movements are full. Visual fields are full.  Motor: The patient has good strength overall   Sensory examination: Soft touch sensation is symmetric on the face, arms, and legs.  Coordination: The patient has good finger-nose-finger bilaterally, trouble lifting legs   Gait and station: Able to stand with push off, uses rolling walker  DIAGNOSTIC DATA (LABS, IMAGING, TESTING) - I reviewed patient records, labs, notes, testing and imaging myself where available. Addendum, laboratory evaluation from October 25, 2023, hemoglobin of 13.7, Depakote  level 26, BNP 156, normal CMP with creatinine of 1.16,  ASSESSMENT AND PLAN  Nicholas [name] is a 73 y.o. male    1. Chronic migraine headache  -About 3 migraines a month, he is pleased with migraine  control -Continue Depakote  ER 500 mg at bedtime for migraine preventative -Try Nurtec 75 mg tablet as needed for acute headache, Ubrelvy  seems to have lost some benefit.  Not a candidate for triptan due to vascular risk factors -Next steps: Ajovy  -Reviewed recent CT head with Dr. Onita, appears known 10 mm hypo enhancing pituitary microadenoma is stable.  Less likely this contributed to lifelong history of migraine headache. -Follow-up in 1 year or sooner if needed  Meds ordered this encounter  Medications   Rimegepant Sulfate (NURTEC) 75 MG TBDP    Sig: Take 1 tablet (75 mg total) by mouth as needed.    Dispense:  8 tablet    Refill:  11   divalproex  (DEPAKOTE  ER) 500 MG 24 hr tablet    Sig: Take 1 tablet (500 mg total) by mouth at bedtime.  Dispense:  90 tablet    Refill:  3   Lauraine Gayland MANDES, DNP  [facility] [address], [address] [place], [place] [address] [phone]

## 2023-11-21 NOTE — Patient Instructions (Addendum)
 Continue Depakote  for migraine prevention  Try Nurtec 75 mg at onset of migraine in place of the Ubrelvy . Nurtec is Take 1 tablet at onset of headache, max is 1 tablet in 24 hours. Follow up in 1 year

## 2023-11-22 DIAGNOSIS — I739 Peripheral vascular disease, unspecified: Secondary | ICD-10-CM | POA: Diagnosis not present

## 2023-11-22 DIAGNOSIS — M6281 Muscle weakness (generalized): Secondary | ICD-10-CM | POA: Diagnosis not present

## 2023-11-22 DIAGNOSIS — R262 Difficulty in walking, not elsewhere classified: Secondary | ICD-10-CM | POA: Diagnosis not present

## 2023-11-22 DIAGNOSIS — R2681 Unsteadiness on feet: Secondary | ICD-10-CM | POA: Diagnosis not present

## 2023-11-25 DIAGNOSIS — R2681 Unsteadiness on feet: Secondary | ICD-10-CM | POA: Diagnosis not present

## 2023-11-25 DIAGNOSIS — I739 Peripheral vascular disease, unspecified: Secondary | ICD-10-CM | POA: Diagnosis not present

## 2023-11-25 DIAGNOSIS — M6281 Muscle weakness (generalized): Secondary | ICD-10-CM | POA: Diagnosis not present

## 2023-11-25 DIAGNOSIS — R262 Difficulty in walking, not elsewhere classified: Secondary | ICD-10-CM | POA: Diagnosis not present

## 2023-11-26 DIAGNOSIS — M6281 Muscle weakness (generalized): Secondary | ICD-10-CM | POA: Diagnosis not present

## 2023-11-26 DIAGNOSIS — R2681 Unsteadiness on feet: Secondary | ICD-10-CM | POA: Diagnosis not present

## 2023-11-26 DIAGNOSIS — R262 Difficulty in walking, not elsewhere classified: Secondary | ICD-10-CM | POA: Diagnosis not present

## 2023-11-26 DIAGNOSIS — I739 Peripheral vascular disease, unspecified: Secondary | ICD-10-CM | POA: Diagnosis not present

## 2023-11-27 DIAGNOSIS — I739 Peripheral vascular disease, unspecified: Secondary | ICD-10-CM | POA: Diagnosis not present

## 2023-11-27 DIAGNOSIS — R262 Difficulty in walking, not elsewhere classified: Secondary | ICD-10-CM | POA: Diagnosis not present

## 2023-11-27 DIAGNOSIS — R2681 Unsteadiness on feet: Secondary | ICD-10-CM | POA: Diagnosis not present

## 2023-11-27 DIAGNOSIS — M6281 Muscle weakness (generalized): Secondary | ICD-10-CM | POA: Diagnosis not present

## 2023-11-28 DIAGNOSIS — R2681 Unsteadiness on feet: Secondary | ICD-10-CM | POA: Diagnosis not present

## 2023-11-28 DIAGNOSIS — R262 Difficulty in walking, not elsewhere classified: Secondary | ICD-10-CM | POA: Diagnosis not present

## 2023-11-28 DIAGNOSIS — M6281 Muscle weakness (generalized): Secondary | ICD-10-CM | POA: Diagnosis not present

## 2023-11-28 DIAGNOSIS — I739 Peripheral vascular disease, unspecified: Secondary | ICD-10-CM | POA: Diagnosis not present

## 2023-11-29 DIAGNOSIS — R262 Difficulty in walking, not elsewhere classified: Secondary | ICD-10-CM | POA: Diagnosis not present

## 2023-11-29 DIAGNOSIS — R2681 Unsteadiness on feet: Secondary | ICD-10-CM | POA: Diagnosis not present

## 2023-11-29 DIAGNOSIS — M6281 Muscle weakness (generalized): Secondary | ICD-10-CM | POA: Diagnosis not present

## 2023-11-29 DIAGNOSIS — I739 Peripheral vascular disease, unspecified: Secondary | ICD-10-CM | POA: Diagnosis not present

## 2023-12-02 DIAGNOSIS — R262 Difficulty in walking, not elsewhere classified: Secondary | ICD-10-CM | POA: Diagnosis not present

## 2023-12-02 DIAGNOSIS — R2681 Unsteadiness on feet: Secondary | ICD-10-CM | POA: Diagnosis not present

## 2023-12-02 DIAGNOSIS — M6281 Muscle weakness (generalized): Secondary | ICD-10-CM | POA: Diagnosis not present

## 2023-12-02 DIAGNOSIS — I739 Peripheral vascular disease, unspecified: Secondary | ICD-10-CM | POA: Diagnosis not present

## 2023-12-03 DIAGNOSIS — E039 Hypothyroidism, unspecified: Secondary | ICD-10-CM | POA: Diagnosis not present

## 2023-12-03 DIAGNOSIS — R600 Localized edema: Secondary | ICD-10-CM | POA: Diagnosis not present

## 2023-12-03 DIAGNOSIS — R9389 Abnormal findings on diagnostic imaging of other specified body structures: Secondary | ICD-10-CM | POA: Diagnosis not present

## 2023-12-03 DIAGNOSIS — I739 Peripheral vascular disease, unspecified: Secondary | ICD-10-CM | POA: Diagnosis not present

## 2023-12-03 DIAGNOSIS — I5032 Chronic diastolic (congestive) heart failure: Secondary | ICD-10-CM | POA: Diagnosis not present

## 2023-12-03 DIAGNOSIS — Z9181 History of falling: Secondary | ICD-10-CM | POA: Diagnosis not present

## 2023-12-03 DIAGNOSIS — Z7901 Long term (current) use of anticoagulants: Secondary | ICD-10-CM | POA: Diagnosis not present

## 2023-12-03 DIAGNOSIS — M6281 Muscle weakness (generalized): Secondary | ICD-10-CM | POA: Diagnosis not present

## 2023-12-03 DIAGNOSIS — I4891 Unspecified atrial fibrillation: Secondary | ICD-10-CM | POA: Diagnosis not present

## 2023-12-03 DIAGNOSIS — R262 Difficulty in walking, not elsewhere classified: Secondary | ICD-10-CM | POA: Diagnosis not present

## 2023-12-03 DIAGNOSIS — R2681 Unsteadiness on feet: Secondary | ICD-10-CM | POA: Diagnosis not present

## 2023-12-03 DIAGNOSIS — Z89422 Acquired absence of other left toe(s): Secondary | ICD-10-CM | POA: Diagnosis not present

## 2023-12-04 DIAGNOSIS — R2681 Unsteadiness on feet: Secondary | ICD-10-CM | POA: Diagnosis not present

## 2023-12-04 DIAGNOSIS — M6281 Muscle weakness (generalized): Secondary | ICD-10-CM | POA: Diagnosis not present

## 2023-12-04 DIAGNOSIS — R262 Difficulty in walking, not elsewhere classified: Secondary | ICD-10-CM | POA: Diagnosis not present

## 2023-12-04 DIAGNOSIS — I739 Peripheral vascular disease, unspecified: Secondary | ICD-10-CM | POA: Diagnosis not present

## 2023-12-05 DIAGNOSIS — I739 Peripheral vascular disease, unspecified: Secondary | ICD-10-CM | POA: Diagnosis not present

## 2023-12-05 DIAGNOSIS — R262 Difficulty in walking, not elsewhere classified: Secondary | ICD-10-CM | POA: Diagnosis not present

## 2023-12-05 DIAGNOSIS — M6281 Muscle weakness (generalized): Secondary | ICD-10-CM | POA: Diagnosis not present

## 2023-12-05 DIAGNOSIS — R2681 Unsteadiness on feet: Secondary | ICD-10-CM | POA: Diagnosis not present

## 2023-12-06 DIAGNOSIS — M6281 Muscle weakness (generalized): Secondary | ICD-10-CM | POA: Diagnosis not present

## 2023-12-06 DIAGNOSIS — I739 Peripheral vascular disease, unspecified: Secondary | ICD-10-CM | POA: Diagnosis not present

## 2023-12-06 DIAGNOSIS — R262 Difficulty in walking, not elsewhere classified: Secondary | ICD-10-CM | POA: Diagnosis not present

## 2023-12-06 DIAGNOSIS — R2681 Unsteadiness on feet: Secondary | ICD-10-CM | POA: Diagnosis not present

## 2023-12-10 ENCOUNTER — Telehealth: Payer: Self-pay

## 2023-12-10 NOTE — Telephone Encounter (Signed)
 Refaxed DM shoe ppw to two different fax numbers

## 2023-12-12 DIAGNOSIS — M79604 Pain in right leg: Secondary | ICD-10-CM | POA: Diagnosis not present

## 2023-12-12 DIAGNOSIS — L905 Scar conditions and fibrosis of skin: Secondary | ICD-10-CM | POA: Diagnosis not present

## 2023-12-12 DIAGNOSIS — M79651 Pain in right thigh: Secondary | ICD-10-CM | POA: Diagnosis not present

## 2023-12-26 ENCOUNTER — Encounter: Payer: Self-pay | Admitting: Internal Medicine

## 2023-12-26 ENCOUNTER — Ambulatory Visit: Payer: 59 | Admitting: Internal Medicine

## 2023-12-26 VITALS — BP 116/71 | HR 112 | Ht 72.0 in | Wt 270.0 lb

## 2023-12-26 DIAGNOSIS — J849 Interstitial pulmonary disease, unspecified: Secondary | ICD-10-CM

## 2023-12-26 DIAGNOSIS — R053 Chronic cough: Secondary | ICD-10-CM

## 2023-12-26 DIAGNOSIS — R0609 Other forms of dyspnea: Secondary | ICD-10-CM

## 2023-12-26 LAB — CBC WITH DIFFERENTIAL/PLATELET
Basophils Absolute: 0.1 10*3/uL (ref 0.0–0.1)
Basophils Relative: 1.1 % (ref 0.0–3.0)
Eosinophils Absolute: 0.3 10*3/uL (ref 0.0–0.7)
Eosinophils Relative: 2.9 % (ref 0.0–5.0)
HCT: 43.4 % (ref 39.0–52.0)
Hemoglobin: 14.5 g/dL (ref 13.0–17.0)
Lymphocytes Relative: 12.5 % (ref 12.0–46.0)
Lymphs Abs: 1.2 10*3/uL (ref 0.7–4.0)
MCHC: 33.6 g/dL (ref 30.0–36.0)
MCV: 93.7 fl (ref 78.0–100.0)
Monocytes Absolute: 1.3 10*3/uL — ABNORMAL HIGH (ref 0.1–1.0)
Monocytes Relative: 13.5 % — ABNORMAL HIGH (ref 3.0–12.0)
Neutro Abs: 7 10*3/uL (ref 1.4–7.7)
Neutrophils Relative %: 70 % (ref 43.0–77.0)
Platelets: 227 10*3/uL (ref 150.0–400.0)
RBC: 4.63 Mil/uL (ref 4.22–5.81)
RDW: 14.8 % (ref 11.5–15.5)
WBC: 10 10*3/uL (ref 4.0–10.5)

## 2023-12-26 NOTE — Progress Notes (Signed)
 OV 12/26/2023  Subjective:  Patient ID: Nicholas [name], male , DOB: 1951/08/03 , age 73 y.o. , MRN: [id number] , ADDRESS: [address] [facility] [id number] PCP Karna Dupes, MD Patient Care Team: Karna Dupes, MD as PCP - General (Family Medicine) End, Cristal Deer, MD as PCP - Cardiology (Cardiology) Yates Decamp, MD (Cardiology) End, Cristal Deer, MD as Consulting Physician (Cardiology)  This Provider for this visit: Treatment Team:  Attending Provider: Kalman Shan, MD    12/26/2023 -   Chief Complaint  Patient presents with   Pulmonary Consult    Referred by Dr. Harrison Mons for possible ILD. He has had DOE over the past year- gets SOB walking "a few feet".      HPI Nicholas [name] 73 y.o. -new consult for evaluation of interstitial lung disease.  History is obtained from him but also reviewed external medical records.  He is born and raised in [place] but is lived temporarily in [place] in [place].  He is to be in the lawn care business.  His wife is deceased for 8 years.  He has a son living in [place] daughter in [place] and 2 sisters in [place].  He is to have falls and for the better part of 18 months is lived in [facility].  For the last 12 months has been using a walker.  He has had a right knee replacement 5 years ago and a left knee replacement 2 years ago.  He says because of mobility is in a nursing home.  He is able to do a lot of activities of daily living but takes a shower with in a seated position.  He has had atrial fibrillation.  He has sleep apnea but does not use CPAP.  He had a previous stroke he is a got diabetes he has obesity.  Denies any cancer.  Has had back surgery  Against this backdrop he says that his room in the nursing home is extremely dusty and for the last 3 to 4 months has been coughing.  Happens day and night.  Worse when he is in his room he does wake up at night.  He is also got shortness of  breath for the same amount of time particularly because of the dusty room but also present with exertion he is not on night oxygen denies any wheezing.  He does have a lot of sneezing.  In December 2024 he had a CT scan of the abdomen.  The lung images show some early ILD that I personally visualized.  Compared to 2017 it seems more prominent.  I showed him these findings.  In 2017 autoimmune serology was negative  He is not aware of these interstitial findings but that is why he is being referred here.  Denies any asthma history.  CT Chest data from date:   - personally visualized and independently interpreted : yes - my findings are: as below Narrative & Impression  CLINICAL DATA:  Right flank and right lower quadrant pain for 1.5 months. Blood in stool.   EXAM: CT ABDOMEN AND PELVIS WITHOUT CONTRAST   TECHNIQUE: Multidetector CT imaging of the abdomen and pelvis was performed following the standard protocol without IV contrast.   RADIATION DOSE REDUCTION: This exam was performed according to the departmental dose-optimization program which includes automated exposure control, adjustment of the mA and/or kV according to patient size and/or use of iterative reconstruction technique.   COMPARISON:  08/21/2016  FINDINGS: Lower chest: Worsening bibasilar chronic interstitial lung disease and subpleural honeycombing.   Hepatobiliary: No mass visualized on this unenhanced exam. Probable tiny sub-cm cyst in the anterior liver dome. Probable tiny 1-2 mm gallstone noted, without signs of cholecystitis or biliary ductal dilatation.   Pancreas: No mass or inflammatory process visualized on this unenhanced exam.   Spleen:  Within normal limits in size.   Adrenals/Urinary tract: No evidence of urolithiasis or hydronephrosis. Unremarkable unopacified urinary bladder.   Stomach/Bowel: No evidence of obstruction, inflammatory process, or abnormal fluid collections. Normal appendix  visualized. Diverticulosis is seen mainly involving the sigmoid colon, however there is no evidence of diverticulitis.   Vascular/Lymphatic: No pathologically enlarged lymph nodes identified. No evidence of abdominal aortic aneurysm.   Reproductive:  No mass or other significant abnormality.   Other:  None.   Musculoskeletal: No suspicious bone lesions identified. Right femoral intramedullary rod again noted.   IMPRESSION: No evidence of urolithiasis, hydronephrosis, or other acute findings.   Colonic diverticulosis, without radiographic evidence of diverticulitis.   Probable tiny gallstone. No radiographic evidence of cholecystitis.   Worsening bibasilar chronic interstitial lung disease with subpleural fibrosis.     Electronically Signed   By: Danae Orleans M.D.   On: 10/07/2023 15:53     Latest Reference Range & Units 08/31/08 15:50 02/08/16 10:53 04/12/16 00:00  Anti Nuclear Antibody (ANA) NEGATIVE   NEG   Cyclic Citrullin Peptide Ab Units  <16   CCP Antibodies IgG/IgA    <16 (E)  Rheumatoid Factor (IgA)    <10 (E)  C3 Complement 88 - 201 mg/dL 161    Complement C4, Body Fluid 16 - 47 mg/dL 28    (E): External lab result  PFT      No data to display             LAB RESULTS last 96 hours No results found.       has a past medical history of Acute kidney failure (08/2008), Acute meningitis (11/06/2022), Allergy, Anxiety, Arthritis, Asthma, Atrial fibrillation (HCC), Blood transfusion, CHF (congestive heart failure) (HCC), CLOSTRIDIUM DIFFICILE COLITIS (07/04/2010), COVID-19 08/12/22 completed Paxlovid 08/20/22 (08/26/2022), Depression with anxiety, Duodenitis determined by biopsy (02/2016), ECZEMA (07/04/2010), Elevated liver enzymes, Fatty liver, Full dentures, GERD (gastroesophageal reflux disease), Heart attack (HCC), History of hiatal hernia, History of stomach ulcers, Hypothyroidism, Lower GI bleeding, MALAR AND MAXILLARY BONES CLOSED FRACTURE  (07/04/2010), Migraine, MRSA (methicillin resistant Staphylococcus aureus), Personal history of colonic adenoma (06/01/2003), PFO (patent foramen ovale), Pneumonia (08/2008), Pneumonia due to COVID-19 virus (08/12/2022), Seizures (HCC) (2009), Sleep apnea, Stroke (HCC) (2010), and Wears glasses.   reports that he has never smoked. He has been exposed to tobacco smoke. He has never used smokeless tobacco.  Past Surgical History:  Procedure Laterality Date   AMPUTATION Left 01/24/2022   Procedure: LEFT GREAT TOE AMPUTATION AT METATARSOPHALANGEAL JOINT AND SECOND TOE AMPUTATION;  Surgeon: [name], MD;  Location: [facility] OR;  Service: Orthopedics;  Laterality: Left;   anterior  nerve transposition  07/2009   left ulnar nerve   antibiotic spacer exchange  11/2008; 08/2006   right knee   ARTHROTOMY  08/2008   right knee w/I&D   CARDIOVASCULAR STRESS TEST  2013   stress EKG - negative for ischemia, 4 min 7.3 METs, normal blood pressure response   CARDIOVERSION N/A 08/24/2022   Procedure: CARDIOVERSION;  Surgeon: Antonieta Iba, MD;  Location: [facility] ORS;  Service: Cardiovascular;  Laterality: N/A;   CATARACT EXTRACTION  COLONOSCOPY  11/2012   diverticulosis, rpt 10 yrs [name])   ESOPHAGOGASTRODUODENOSCOPY  02/2016   erosive gastropathy with duodenal erosions [name])   HARDWARE REMOVAL  04/2006   right knee w/antibiotic spacers placed   INGUINAL HERNIA REPAIR  early 1990's   bilateral   JOINT REPLACEMENT Bilateral    KNEE ARTHROSCOPY  10/2001   right   KNEE FUSION  03/2009   right knee removal; antibiotic spacers;    LEFT HEART CATH AND CORONARY ANGIOGRAPHY N/A 02/04/2018    WNL (Patwardhan, [name], MD)   LUMBAR LAMINECTOMY/DECOMPRESSION MICRODISCECTOMY N/A 12/05/2018   Procedure: LUMBAR THREE TO FOUR AND LUMBAR FOUR TO FIVE DECOMPRESSION;  Surgeon: [name], MD;  Location: [facility] OR;  Service: Orthopedics;  Laterality: N/A;   MULTIPLE TOOTH EXTRACTIONS     REPLACEMENT TOTAL KNEE   08/2008; 09/2001   right   REVERSE SHOULDER ARTHROPLASTY Left 09/03/2017   Procedure: REVERSE SHOULDER ARTHROPLASTY;  Surgeon: [name], MD;  Location: [facility] OR;  Service: Orthopedics;  Laterality: Left;   REVERSE SHOULDER ARTHROPLASTY Right 05/13/2018   Procedure: RIGHT REVERSE SHOULDER ARTHROPLASTY;  Surgeon: [name], MD;  Location: [facility] OR;  Service: Orthopedics;  Laterality: Right;   rod placement Right 03/2009   R knee   SYNOVECTOMY  06/2005   debridement, liner exchange right knee   TEE WITHOUT CARDIOVERSION N/A 08/24/2022   Procedure: TRANSESOPHAGEAL ECHOCARDIOGRAM (TEE);  Surgeon: Antonieta Iba, MD;  Location: [facility] ORS;  Service: Cardiovascular;  Laterality: N/A;   TOE AMPUTATION Left 08/2008   great toe - osteomyelitis (staph infection)   TOE AMPUTATION Left 04/2016   2nd toe August Saucer)   TONSILLECTOMY     TOTAL KNEE ARTHROPLASTY Left 01/15/2017   TOTAL KNEE ARTHROPLASTY Left 01/15/2017   Procedure: TOTAL KNEE ARTHROPLASTY;  Surgeon: [name], MD;  Location: [facility] OR;  Service: Orthopedics;  Laterality: Left;    Allergies  Allergen Reactions   Peanut-Containing Drug Products Anaphylaxis and Dermatitis   Yellow Jacket Venom [Bee Venom] Anaphylaxis and Other (See Comments)    Respiratory Distress   Celebrex [Celecoxib] Other (See Comments)    BLACK STOOL?MELENA?   Percocet [Oxycodone-Acetaminophen] Hives, Swelling and Other (See Comments)   Latex Rash   Nsaids Rash    Immunization History  Administered Date(s) Administered   Influenza, High Dose Seasonal PF 08/15/2017   Influenza,inj,Quad PF,6+ Mos 07/01/2013   Influenza-Unspecified 08/16/2011, 07/18/2016, 06/03/2018, 09/15/2023   Pneumococcal Conjugate-13 10/05/2016   Pneumococcal Polysaccharide-23 08/16/2011, 02/14/2021   Td 01/30/2006   Zoster Recombinant(Shingrix) 12/12/2017, 04/22/2018   Zoster, Live 10/15/2012    Family History  Problem Relation Age of Onset   Coronary artery disease  Father 40   Stroke Father    CAD Mother 42       stents   Breast cancer Sister    Diabetes Brother    Diabetes Sister    Colon cancer Neg Hx    Stomach cancer Neg Hx      Current Outpatient Medications:    acetaminophen (TYLENOL) 500 MG tablet, Take 1,000 mg by mouth 3 (three) times daily., Disp: , Rfl:    amitriptyline (ELAVIL) 50 MG tablet, Take 0.5 tablets (25 mg total) by mouth in the morning and at bedtime., Disp: , Rfl:    apixaban (ELIQUIS) 5 MG TABS tablet, Take 1 tablet (5 mg total) by mouth 2 (two) times daily., Disp: 60 tablet, Rfl: 3   atorvastatin (LIPITOR) 20 MG tablet, TAKE ONE TABLET BY MOUTH ONCE  DAILY (Patient taking differently: Take 10 mg by mouth at bedtime.), Disp: 90 tablet, Rfl: 3   cetirizine (ZYRTEC) 10 MG tablet, Take 1 tablet (10 mg total) by mouth daily., Disp: 30 tablet, Rfl: 0   Cholecalciferol (VITAMIN D3) 25 MCG (1000 UT) CAPS, Take 1 capsule (1,000 Units total) by mouth daily., Disp: 90 capsule, Rfl: 1   cyanocobalamin (VITAMIN B12) 1000 MCG tablet, Take 1 tablet (1,000 mcg total) by mouth daily., Disp: , Rfl:    EPINEPHrine 0.3 mg/0.3 mL IJ SOAJ injection, Inject 0.3 mg into the muscle as needed for anaphylaxis. (bee stings), Disp: 2 each, Rfl: 1   gabapentin (NEURONTIN) 300 MG capsule, Take 600 mg by mouth 3 (three) times daily. 300 mg in the am, 600 mg in the afternoon, and 600 mg in the evening, Disp: , Rfl:    levothyroxine (SYNTHROID) 125 MCG tablet, Take 1 tablet (125 mcg total) by mouth daily before breakfast., Disp: 90 tablet, Rfl: 4   Menthol, Topical Analgesic, (BIOFREEZE) 4 % GEL, Apply 1 Application topically daily. Neck pain, Disp: , Rfl:    metoprolol succinate (TOPROL-XL) 100 MG 24 hr tablet, Take 1 tablet (100 mg total) by mouth at bedtime. Take with or immediately following a meal., Disp: , Rfl:    nitroGLYCERIN (NITROSTAT) 0.4 MG SL tablet, Place 1 tablet (0.4 mg total) under the tongue every 5 (five) minutes as needed for chest pain.,  Disp: 20 tablet, Rfl: 3   omeprazole (PRILOSEC) 40 MG capsule, Take 1 capsule (40 mg total) by mouth daily., Disp: 90 capsule, Rfl: 3   ondansetron (ZOFRAN-ODT) 4 MG disintegrating tablet, TAKE ONE TABLET BY MOUTH every EIGHT hours AS NEEDED, Disp: 20 tablet, Rfl: 3   Probiotic Product (PROBIOTIC DAILY) CAPS, Take 1 capsule by mouth. 1 capsule in the morning every 3 days, Disp: , Rfl:    Rimegepant Sulfate (NURTEC) 75 MG TBDP, Take 1 tablet (75 mg total) by mouth as needed., Disp: 8 tablet, Rfl: 11   tamsulosin (FLOMAX) 0.4 MG CAPS capsule, TAKE ONE CAPSULE BY MOUTH ONCE DAILY, Disp: 30 capsule, Rfl: 2   tiZANidine (ZANAFLEX) 4 MG tablet, TAKE ONE TABLET BY MOUTH every SIX hours AS NEEDED FOR acute HEADACHE, Disp: 20 tablet, Rfl: 1   Ubrogepant (UBRELVY) 100 MG TABS, Take 100 mg by mouth as needed (take 1 tablet at onset of headache, may repeat in hours, max is 200 mg in 24 hours)., Disp: 12 tablet, Rfl: 11   ferrous sulfate 325 (65 FE) MG EC tablet, Take 325 mg by mouth daily with breakfast., Disp: , Rfl:    furosemide (LASIX) 20 MG tablet, Take 40 mg by mouth daily., Disp: , Rfl:       Objective:   Vitals:   12/26/23 1114  BP: 116/71  Pulse: (!) 112  SpO2: 93%  Weight: 270 lb (122.5 kg)  Height: 6' (1.829 m)    Estimated body mass index is 36.62 kg/m as calculated from the following:   Height as of this encounter: 6' (1.829 m).   Weight as of this encounter: 270 lb (122.5 kg).  @WEIGHTCHANGE @  [employer]   12/26/23 1114  Weight: 270 lb (122.5 kg)     Physical Exam   General: No distress. Siting with RLE Extended O2 at rest: no Cane present: no Sitting in wheel chair: no Frail: no Obese: yes Neuro: Alert and Oriented x 3. GCS 15. Speech normal Psych: Pleasant Resp:  Barrel Chest - no.  Wheeze - no,  Crackles - MILD CRACKLES LUNG BASE, No overt respiratory distress CVS: Normal heart sounds. Murmurs - no Ext: Stigmata of Connective Tissue Disease - no HEENT: Normal  upper airway. PEERL +. No post nasal drip        Assessment:       ICD-10-CM   1. Chronic cough  R05.3 Pulmonary function test    CT Chest High Resolution    CBC w/Diff    Perennial allergen profile IgE    Antinuclear Antib (ANA)    Rheumatoid factor    Cyclic citrul peptide antibody, IgG    Hypersensitivity pnuemonitis profile    QuantiFERON-TB Gold Plus    2. DOE (dyspnea on exertion)  R06.09 Pulmonary function test    CT Chest High Resolution    CBC w/Diff    Perennial allergen profile IgE    Antinuclear Antib (ANA)    Rheumatoid factor    Cyclic citrul peptide antibody, IgG    Hypersensitivity pnuemonitis profile    QuantiFERON-TB Gold Plus    3. ILD (interstitial lung disease) (HCC)  J84.9 Pulmonary function test    CT Chest High Resolution    CBC w/Diff    Perennial allergen profile IgE    Antinuclear Antib (ANA)    Rheumatoid factor    Cyclic citrul peptide antibody, IgG    Hypersensitivity pnuemonitis profile    QuantiFERON-TB Gold Plus         Plan:     Patient Instructions     ICD-10-CM   1. Chronic cough  R05.3 Pulmonary function test    CT Chest High Resolution    CBC w/Diff    Perennial allergen profile IgE    Antinuclear Antib (ANA)    Rheumatoid factor    Cyclic citrul peptide antibody, IgG    Hypersensitivity pnuemonitis profile    QuantiFERON-TB Gold Plus    2. DOE (dyspnea on exertion)  R06.09 Pulmonary function test    CT Chest High Resolution    CBC w/Diff    Perennial allergen profile IgE    Antinuclear Antib (ANA)    Rheumatoid factor    Cyclic citrul peptide antibody, IgG    Hypersensitivity pnuemonitis profile    QuantiFERON-TB Gold Plus    3. ILD (interstitial lung disease) (HCC)  J84.9 Pulmonary function test    CT Chest High Resolution    CBC w/Diff    Perennial allergen profile IgE    Antinuclear Antib (ANA)    Rheumatoid factor    Cyclic citrul peptide antibody, IgG    Hypersensitivity pnuemonitis profile     QuantiFERON-TB Gold Plus       You appear to have mild interstitial lung disease that seems to be little bit more prominent compared to the CT scan in 2017.  Still overall burden appears to be mild.  It is unclear if the current cough and shortness of breath are related to this or an incidental finding.  Plan - Take ILD questionnaire with you and bring it next time - Do blood work for autoimmune serology -Check QuantiFERON gold - Do blood work for allergy panel given the dusty environment you are living - Do full pulmonary function test - Do high-resolution CT chest   Follow-up - 8 weeks 30-minute visit with Dr. Marchelle Gearing but after completing all the test.   FOLLOWUP Return in about 1 week (around 01/02/2024) for 30 min visit, after completing all the test.  With Dr Marchelle Gearing, Face to Face Visit.    SIGNATURE  Dr. Kalman Shan, M.D., F.C.C.P,  Pulmonary and Critical Care Medicine Staff Physician, [facility] Director - Interstitial Lung Disease  Program  Pulmonary Fibrosis [facility] at [facility] [place], [place], [address]  Pager: [phone], If no answer or between  15:00h - 7:00h: call [phone] Telephone: [phone]  11:58 AM 12/26/2023

## 2023-12-26 NOTE — Patient Instructions (Addendum)
 ICD-10-CM   1. Chronic cough  R05.3 Pulmonary function test    CT Chest High Resolution    CBC w/Diff    Perennial allergen profile IgE    Antinuclear Antib (ANA)    Rheumatoid factor    Cyclic citrul peptide antibody, IgG    Hypersensitivity pnuemonitis profile    QuantiFERON-TB Gold Plus    2. DOE (dyspnea on exertion)  R06.09 Pulmonary function test    CT Chest High Resolution    CBC w/Diff    Perennial allergen profile IgE    Antinuclear Antib (ANA)    Rheumatoid factor    Cyclic citrul peptide antibody, IgG    Hypersensitivity pnuemonitis profile    QuantiFERON-TB Gold Plus    3. ILD (interstitial lung disease) (HCC)  J84.9 Pulmonary function test    CT Chest High Resolution    CBC w/Diff    Perennial allergen profile IgE    Antinuclear Antib (ANA)    Rheumatoid factor    Cyclic citrul peptide antibody, IgG    Hypersensitivity pnuemonitis profile    QuantiFERON-TB Gold Plus       You appear to have mild interstitial lung disease that seems to be little bit more prominent compared to the CT scan in 2017.  Still overall burden appears to be mild.  It is unclear if the current cough and shortness of breath are related to this or an incidental finding.  Plan - Take ILD questionnaire with you and bring it next time - Do blood work for autoimmune serology -Check QuantiFERON gold - Do blood work for allergy panel given the dusty environment you are living - Do full pulmonary function test - Do high-resolution CT chest   Follow-up - 8 weeks 30-minute visit with Dr. Marchelle Gearing but after completing all the test.

## 2023-12-30 ENCOUNTER — Other Ambulatory Visit: Payer: Self-pay

## 2023-12-30 ENCOUNTER — Emergency Department (HOSPITAL_COMMUNITY)
Admission: EM | Admit: 2023-12-30 | Discharge: 2023-12-30 | Disposition: A | Discharge status: Home / Self Care | Attending: Emergency Medicine | Admitting: Emergency Medicine

## 2023-12-30 ENCOUNTER — Encounter (HOSPITAL_COMMUNITY): Payer: Self-pay

## 2023-12-30 DIAGNOSIS — Z7401 Bed confinement status: Secondary | ICD-10-CM | POA: Diagnosis not present

## 2023-12-30 DIAGNOSIS — Z7901 Long term (current) use of anticoagulants: Secondary | ICD-10-CM | POA: Diagnosis not present

## 2023-12-30 DIAGNOSIS — Z91040 Latex allergy status: Secondary | ICD-10-CM | POA: Insufficient documentation

## 2023-12-30 DIAGNOSIS — M79606 Pain in leg, unspecified: Secondary | ICD-10-CM | POA: Diagnosis not present

## 2023-12-30 DIAGNOSIS — Z743 Need for continuous supervision: Secondary | ICD-10-CM | POA: Diagnosis not present

## 2023-12-30 DIAGNOSIS — M79604 Pain in right leg: Secondary | ICD-10-CM | POA: Diagnosis not present

## 2023-12-30 DIAGNOSIS — I10 Essential (primary) hypertension: Secondary | ICD-10-CM | POA: Diagnosis not present

## 2023-12-30 DIAGNOSIS — I509 Heart failure, unspecified: Secondary | ICD-10-CM | POA: Insufficient documentation

## 2023-12-30 LAB — QUANTIFERON-TB GOLD PLUS
Mitogen-NIL: 4.83 [IU]/mL
NIL: 0.03 [IU]/mL
QuantiFERON-TB Gold Plus: NEGATIVE
TB1-NIL: 0 [IU]/mL
TB2-NIL: 0 [IU]/mL

## 2023-12-30 MED ORDER — METHYLPREDNISOLONE 4 MG PO TBPK: ORAL_TABLET | ORAL | 0 refills | Status: DC

## 2023-12-30 NOTE — ED Provider Notes (Signed)
 [facility] AT [facility] Provider Note   CSN: [id number] Arrival date & time: 12/30/23  1907     History  Chief Complaint  Patient presents with   Leg Pain    Nicholas [name] is a 73 y.o. male.   Leg Pain   73 year old male presents emergency department complaints of right-sided leg pain.  Reporting pain from his right hip down to his right heel.  States radiating down both of his legs that has been present for "years and years".  States that he has this pain every day.  Is on gabapentin as well as Tylenol for treatment of the pain of which she states does not help very much.  Patient reports increased right-sided pain over the past 3 to 4 days.  States that he was a little more active walking around more than normal prior to symptoms beginning.  Denies any weakness or sensory deficit lower extremities, saddle anesthesia, bowel/bladder incontinence, falls/traumas, fevers, history of IV drug use.  Denies chest pain, shortness of breath.  Does state that he had surgical intervention 7-8 years ago on his right leg where he had a rod placed from his femur down to his distal tibia.  States whenever the weather changes, this causes his pain to worsen.  Past medical history significant for seizure, atrial fibrillation, CHF, GERD, MI, MRSA, obesity, OSA, dyslipidemia, pituitary adenoma,  Home Medications Prior to Admission medications   Medication Sig Start Date End Date Taking? Authorizing Provider  acetaminophen (TYLENOL) 500 MG tablet Take 1,000 mg by mouth 3 (three) times daily.    [provider]  amitriptyline (ELAVIL) 50 MG tablet Take 0.5 tablets (25 mg total) by mouth in the morning and at bedtime. 03/08/23   [name], MD  apixaban (ELIQUIS) 5 MG TABS tablet Take 1 tablet (5 mg total) by mouth 2 (two) times daily. 09/27/22   [name], MD  atorvastatin (LIPITOR) 20 MG tablet TAKE ONE TABLET BY MOUTH ONCE DAILY Patient taking  differently: Take 10 mg by mouth at bedtime. 02/21/22   [name], MD  cetirizine (ZYRTEC) 10 MG tablet Take 1 tablet (10 mg total) by mouth daily. 07/25/22   Eden Emms, NP  Cholecalciferol (VITAMIN D3) 25 MCG (1000 UT) CAPS Take 1 capsule (1,000 Units total) by mouth daily. 03/24/21   [name], MD  cyanocobalamin (VITAMIN B12) 1000 MCG tablet Take 1 tablet (1,000 mcg total) by mouth daily. 03/08/23   [name], MD  EPINEPHrine 0.3 mg/0.3 mL IJ SOAJ injection Inject 0.3 mg into the muscle as needed for anaphylaxis. (bee stings) 12/18/21   [name], MD  ferrous sulfate 325 (65 FE) MG EC tablet Take 325 mg by mouth daily with breakfast.    [provider]  furosemide (LASIX) 20 MG tablet Take 40 mg by mouth daily. 03/08/23   [name], MD  gabapentin (NEURONTIN) 300 MG capsule Take 600 mg by mouth 3 (three) times daily. 300 mg in the am, 600 mg in the afternoon, and 600 mg in the evening 03/08/23   [name], MD  levothyroxine (SYNTHROID) 125 MCG tablet Take 1 tablet (125 mcg total) by mouth daily before breakfast. 03/08/23   [name], MD  Menthol, Topical Analgesic, (BIOFREEZE) 4 % GEL Apply 1 Application topically daily. Neck pain    [provider]  metoprolol succinate (TOPROL-XL) 100 MG 24 hr tablet Take 1 tablet (100 mg total) by mouth at bedtime. Take with or immediately following  a meal. 08/28/22   Lurene Shadow, MD  nitroGLYCERIN (NITROSTAT) 0.4 MG SL tablet Place 1 tablet (0.4 mg total) under the tongue every 5 (five) minutes as needed for chest pain. 11/27/21   [name], MD  omeprazole (PRILOSEC) 40 MG capsule Take 1 capsule (40 mg total) by mouth daily. 06/13/23   Iva Boop, MD  ondansetron (ZOFRAN-ODT) 4 MG disintegrating tablet TAKE ONE TABLET BY MOUTH every EIGHT hours AS NEEDED 10/04/22   Glean Salvo, NP  Probiotic Product (PROBIOTIC DAILY) CAPS Take 1 capsule by mouth. 1 capsule in the morning  every 3 days    [provider]  Rimegepant Sulfate (NURTEC) 75 MG TBDP Take 1 tablet (75 mg total) by mouth as needed. 11/21/23   Glean Salvo, NP  tamsulosin (FLOMAX) 0.4 MG CAPS capsule TAKE ONE CAPSULE BY MOUTH ONCE DAILY 11/20/22   [name], MD  tiZANidine (ZANAFLEX) 4 MG tablet TAKE ONE TABLET BY MOUTH every SIX hours AS NEEDED FOR acute HEADACHE 10/04/22   Glean Salvo, NP  Ubrogepant (UBRELVY) 100 MG TABS Take 100 mg by mouth as needed (take 1 tablet at onset of headache, may repeat in hours, max is 200 mg in 24 hours). 07/24/22   Glean Salvo, NP      Allergies    Peanut-containing drug products, Yellow jacket venom [bee venom], Celebrex [celecoxib], Percocet [oxycodone-acetaminophen], Latex, and Nsaids    Review of Systems   Review of Systems  All other systems reviewed and are negative.   Physical Exam Updated Vital Signs BP 124/64 (BP Location: Left Arm)   Pulse 70   Temp 98.6 F (37 C) (Oral)   Resp 18   Ht 6' (1.829 m)   Wt 121.1 kg   SpO2 99%   BMI 36.21 kg/m  Physical Exam Vitals and nursing note reviewed.  Constitutional:      General: He is not in acute distress.    Appearance: He is well-developed.  HENT:     Head: Normocephalic and atraumatic.  Eyes:     Conjunctiva/sclera: Conjunctivae normal.  Cardiovascular:     Rate and Rhythm: Normal rate and regular rhythm.  Pulmonary:     Effort: Pulmonary effort is normal. No respiratory distress.     Breath sounds: Normal breath sounds.  Abdominal:     Palpations: Abdomen is soft.     Tenderness: There is no abdominal tenderness.  Musculoskeletal:        General: No swelling.     Cervical back: Neck supple.     Comments: Patient with 0-1+ pitting edema right lower extremity with 2+ pitting edema left lower extremity.  No overlying erythema, palpable fluctuance/induration.  Pedal and posterior tib pulses 2+ bilaterally.  Patient with symmetric muscular strength ankle dorsi/plantarflexion  bilaterally.  Unable to assess right knee flexion/extension due to prior surgical intervention.  Skin:    General: Skin is warm and dry.     Capillary Refill: Capillary refill takes less than 2 seconds.  Neurological:     Mental Status: He is alert.  Psychiatric:        Mood and Affect: Mood normal.     ED Results / Procedures / Treatments   Labs (all labs ordered are listed, but only abnormal results are displayed) Labs Reviewed - No data to display  EKG None  Radiology No results found.  Procedures Procedures    Medications Ordered in ED Medications - No data to display  ED Course/ Medical  Decision Making/ A&P                                 Medical Decision Making  This patient presents to the ED for concern of leg pain, this involves an extensive number of treatment options, and is a complaint that carries with it a high risk of complications and morbidity.  The differential diagnosis includes fracture, strain/pain, dislocation, ischemic limb, cellulitis, Supples, negative for infection, compartment syndrome or DVT, other   Co morbidities that complicate the patient evaluation  See HPI   Additional history obtained:  Additional history obtained from EMR External records from outside source obtained and reviewed including hospital records   Lab Tests:  N/a   Imaging Studies ordered:  N/a   Cardiac Monitoring: / EKG:  The patient was maintained on a cardiac monitor.  I personally viewed and interpreted the cardiac monitored which showed an underlying rhythm of: sinus rhythm   Consultations Obtained:  N/a   Problem List / ED Course / Critical interventions / Medication management  Right leg pain Reevaluation of the patient showed that the patient stayed the same I have reviewed the patients home medicines and have made adjustments as needed   Social Determinants of Health:  Denies tobacco, illicit drug use.   Test / Admission -  Considered:  Right leg pain Vitals signs within normal range and stable throughout visit. 73 year old male presents emergency department complaints of right-sided leg pain.  Reporting pain from his right hip down to his right heel.  States radiating down both of his legs that has been present for "years and years".  States that he has this pain every day.  Is on gabapentin as well as Tylenol for treatment of the pain of which she states does not help very much.  Patient reports increased right-sided pain over the past 3 to 4 days.  States that he was a little more active walking around more than normal prior to symptoms beginning.  Denies any weakness or sensory deficit lower extremities, saddle anesthesia, bowel/bladder incontinence, falls/traumas, fevers, history of IV drug use.  Denies chest pain, shortness of breath.  Does state that he had surgical intervention 7-8 years ago on his right leg where he had a rod placed from his femur down to his distal tibia.  States whenever the weather changes, this causes his pain to worsen. On exam, no pulse deficits to suggest ischemic limb.  No overlying skin changes concerning for secondary infectious process.  Lower extremity edema present but patient states that this is his baseline.  No traumatic injury to suggest acute fracture/dislocation.  Patient states that he has pain "every single day" and was worse over the past 3 to 4 days when he was ambulating on it more frequently.  No red flag signs or back pain on HPI/PE concerning for spinal cord compression/impingement, cauda equina, spinal epidural abscess.  Patient does report worsening of pain as the weather changes which could be the case in the setting of warmer weather of spring. [place] not available currently but will provide order to be performed tomorrow; patient is anticoagulated on Eliquis so lower likelihood for DVT. Will trial medication to treat patient's pain and have him follow-up with primary  care/orthopedics in the outpatient setting for reassessment. Worrisome signs and symptoms were discussed with the patient, and the patient acknowledged understanding to return to the ED if noticed. Patient was stable upon  discharge.          Final Clinical Impression(s) / ED Diagnoses Final diagnoses:  Right leg pain    Rx / DC Orders ED Discharge Orders          Ordered    LE VENOUS        12/30/23 2100             Peter Garter, [place] 12/30/23 2109    Glyn Ade, MD 12/30/23 2256

## 2023-12-30 NOTE — ED Triage Notes (Signed)
 Pt BIB EMS. Pt has chronic bilateral leg pain and states left leg is hurting more today. Pt has rod in left femur, pitting edema in left foot. Hx of CHF. Axox4.

## 2023-12-30 NOTE — Discharge Instructions (Addendum)
 As discussed, will place you on steroids for treatment of your leg pain.  Will order ultrasound to be performed tomorrow at [facility].  Recommend follow-up with your primary care for reassessment.  Please do not hesitate to return if the worrisome signs and symptoms we discussed become apparent.

## 2023-12-31 ENCOUNTER — Ambulatory Visit (HOSPITAL_COMMUNITY)
Admission: RE | Admit: 2023-12-31 | Discharge: 2023-12-31 | Disposition: A | Discharge status: Home / Self Care | Source: Ambulatory Visit | Attending: Emergency Medicine | Admitting: Emergency Medicine

## 2023-12-31 DIAGNOSIS — G8929 Other chronic pain: Secondary | ICD-10-CM | POA: Diagnosis not present

## 2023-12-31 DIAGNOSIS — M79604 Pain in right leg: Secondary | ICD-10-CM | POA: Diagnosis not present

## 2023-12-31 DIAGNOSIS — M792 Neuralgia and neuritis, unspecified: Secondary | ICD-10-CM | POA: Diagnosis not present

## 2023-12-31 DIAGNOSIS — M7989 Other specified soft tissue disorders: Secondary | ICD-10-CM | POA: Diagnosis not present

## 2023-12-31 DIAGNOSIS — R600 Localized edema: Secondary | ICD-10-CM | POA: Insufficient documentation

## 2024-01-01 LAB — ANA: Anti Nuclear Antibody (ANA): NEGATIVE

## 2024-01-01 LAB — HYPERSENSITIVITY PNUEMONITIS PROFILE
ASPERGILLUS FUMIGATUS: NEGATIVE
Faenia retivirgula: NEGATIVE
Pigeon Serum: NEGATIVE
S. VIRIDIS: NEGATIVE
T. CANDIDUS: NEGATIVE
T. VULGARIS: NEGATIVE

## 2024-01-01 LAB — RHEUMATOID FACTOR: Rheumatoid fact SerPl-aCnc: <10 [IU]/mL (ref <14)

## 2024-01-01 LAB — ALLERGEN PROFILE, PERENNIAL ALLERGEN IGE

## 2024-01-01 LAB — CYCLIC CITRUL PEPTIDE ANTIBODY, IGG: Cyclic Citrullin Peptide Ab: <16 U

## 2024-01-14 ENCOUNTER — Telehealth: Payer: Self-pay | Admitting: Internal Medicine

## 2024-01-20 ENCOUNTER — Ambulatory Visit
Admission: RE | Admit: 2024-01-20 | Discharge: 2024-01-20 | Disposition: A | Discharge status: Home / Self Care | Source: Ambulatory Visit | Attending: Internal Medicine | Admitting: Internal Medicine

## 2024-01-20 DIAGNOSIS — R0609 Other forms of dyspnea: Secondary | ICD-10-CM

## 2024-01-20 DIAGNOSIS — J849 Interstitial pulmonary disease, unspecified: Secondary | ICD-10-CM

## 2024-01-20 DIAGNOSIS — J8410 Pulmonary fibrosis, unspecified: Secondary | ICD-10-CM | POA: Diagnosis not present

## 2024-01-20 DIAGNOSIS — R053 Chronic cough: Secondary | ICD-10-CM

## 2024-01-22 ENCOUNTER — Encounter: Payer: Self-pay | Admitting: Podiatry

## 2024-01-22 ENCOUNTER — Ambulatory Visit (INDEPENDENT_AMBULATORY_CARE_PROVIDER_SITE_OTHER): Payer: 59 | Admitting: Podiatry

## 2024-01-22 ENCOUNTER — Telehealth: Payer: Self-pay

## 2024-01-22 DIAGNOSIS — M79674 Pain in right toe(s): Secondary | ICD-10-CM

## 2024-01-22 DIAGNOSIS — E1142 Type 2 diabetes mellitus with diabetic polyneuropathy: Secondary | ICD-10-CM

## 2024-01-22 DIAGNOSIS — D689 Coagulation defect, unspecified: Secondary | ICD-10-CM | POA: Diagnosis not present

## 2024-01-22 DIAGNOSIS — M79675 Pain in left toe(s): Secondary | ICD-10-CM

## 2024-01-22 DIAGNOSIS — B351 Tinea unguium: Secondary | ICD-10-CM | POA: Diagnosis not present

## 2024-01-22 NOTE — Telephone Encounter (Signed)
 Pt. Called X 2 and left a VM for a return call  concerning his [place] results from his ED visit on 12/30/2023.  Pt. Reports that his Leg has been swollen and having pain for 2 years.  I informed pt. That he should return to the ED for evaluation or follow-up with his PCP. [place] results were negative for a blood clot or popliteal cyst.

## 2024-01-22 NOTE — Telephone Encounter (Signed)
 Ppw for DM shoes was never received patient was here for Dr. Visit today and was given Candace Cruise ppw that he will take to Dr. To get signed / order [phone] was cancelled on 4/5 with [facility], I am re-ordering again today so that ppw can link with new order once faxed back  Addison Bailey Cped, CFo, CFm

## 2024-01-22 NOTE — Progress Notes (Signed)
 Subjective:  Patient ID: Nicholas [name], male    DOB: [birth year],   MRN: [id number]  No chief complaint on file.   73 y.o. male presents for concern of thickened elongated and painful nails that are difficult to trim. Requesting to have them trimmed today. Relates burning and tingling in their feet. Patient relates he was recently diangosed with diabetes and unclear from chart. No recent A1c. He is on eliquis and at risk for routine foot care  PCP:  Karna Dupes, MD    . Denies any other pedal complaints. Denies n/v/f/c.   Past Medical History:  Diagnosis Date   Acute kidney failure 08/2008   "cleared up"  no problems since   Acute meningitis 11/06/2022   Allergy    Anxiety    Arthritis    Asthma    ?of this no inhaler   Atrial fibrillation (HCC)    Blood transfusion    CHF (congestive heart failure) (HCC)    ?of this, pt denies   CLOSTRIDIUM DIFFICILE COLITIS 07/04/2010   Annotation: 12/09, 2/10 Qualifier: Diagnosis of  By: [name], [name]     COVID-19 08/12/22 completed Paxlovid 08/20/22 08/26/2022   Depression with anxiety    Duodenitis determined by biopsy 02/2016   peptic likely due to aleve (erosive gastropathy with duodenal erosions)   ECZEMA 07/04/2010   Qualifier: Diagnosis of  By: [name], [name]     Elevated liver enzymes    Fatty liver    Full dentures    GERD (gastroesophageal reflux disease)    Heart attack (HCC)    08/2008 (likely demand ischemia in the setting of MSSA sepsis/R TKA  infection)10-2008   History of hiatal hernia    History of stomach ulcers    Hypothyroidism    Lower GI bleeding    MALAR AND MAXILLARY BONES CLOSED FRACTURE 07/04/2010   Annotation: ORIF Qualifier: Diagnosis of  By: [name], [name]     Migraine    "definitely"   MRSA (methicillin resistant Staphylococcus aureus)    in leg, had to place steel rod in leg   Personal history of colonic adenoma 06/01/2003   PFO (patent foramen ovale)    small PFO by 08/2008 TEE    Pneumonia 08/2008   "while in ICU"   Pneumonia due to COVID-19 virus 08/12/2022   Seizures (HCC) 2009   "long time ago"     Sleep apnea    does not wear CPAP   Stroke (HCC) 2010   unable to complete sentences at times   Wears glasses     Objective:  Physical Exam: Vascular: DP/PT pulses 2/4 bilateral. CFT <3 seconds. Absent hair growth on digits. Edema noted to bilateral lower extremities. Xerosis noted bilaterally.  Skin. No lacerations or abrasions bilateral feet. Nails 3-5 bilateral  and 1,2 on right are thickened discolored and elongated with subungual debris. Hyperkeratotic lesion noted sub first metatarsal head on left.  Musculoskeletal: MMT 5/5 bilateral lower extremities in DF, PF, Inversion and Eversion. Deceased ROM in DF of ankle joint. Left hallux and second digit amputated.  Neurological: Sensation intact to light touch. Protective sensation diminished bilateral.    Assessment:   1. Pain due to onychomycosis of toenails of both feet   2. Type 2 diabetes mellitus with peripheral neuropathy (HCC)   3. Coagulation defect (HCC)       Plan:  Patient was evaluated and treated and all questions answered. -Discussed and educated patient on diabetic foot care, especially with  regards to the vascular, neurological and musculoskeletal systems.  -Stressed the importance of good glycemic control and the detriment of not  controlling glucose levels in relation to the foot. -Discussed supportive shoes at all times and checking feet regularly.  -Mechanically debrided all nails 1-5 bilateral using sterile nail nipper and filed with dremel without incident  -Answered all patient questions -Patient to return  in 3 months for at risk foot care -Patient advised to call the office if any problems or questions arise in the meantime.   Louann Sjogren, DPM

## 2024-01-24 ENCOUNTER — Emergency Department (HOSPITAL_COMMUNITY)
Admission: EM | Admit: 2024-01-24 | Discharge: 2024-01-24 | Disposition: A | Discharge status: Other Acute Inpatient Hospital | Attending: Emergency Medicine | Admitting: Emergency Medicine

## 2024-01-24 ENCOUNTER — Encounter (HOSPITAL_COMMUNITY): Payer: Self-pay

## 2024-01-24 ENCOUNTER — Other Ambulatory Visit: Payer: Self-pay

## 2024-01-24 ENCOUNTER — Emergency Department (HOSPITAL_COMMUNITY)

## 2024-01-24 DIAGNOSIS — Z96611 Presence of right artificial shoulder joint: Secondary | ICD-10-CM | POA: Diagnosis not present

## 2024-01-24 DIAGNOSIS — Z91040 Latex allergy status: Secondary | ICD-10-CM | POA: Diagnosis not present

## 2024-01-24 DIAGNOSIS — Z743 Need for continuous supervision: Secondary | ICD-10-CM | POA: Diagnosis not present

## 2024-01-24 DIAGNOSIS — Z91010 Allergy to peanuts: Secondary | ICD-10-CM | POA: Diagnosis not present

## 2024-01-24 DIAGNOSIS — R079 Chest pain, unspecified: Secondary | ICD-10-CM | POA: Diagnosis not present

## 2024-01-24 DIAGNOSIS — Z7901 Long term (current) use of anticoagulants: Secondary | ICD-10-CM | POA: Diagnosis not present

## 2024-01-24 DIAGNOSIS — Z7401 Bed confinement status: Secondary | ICD-10-CM | POA: Diagnosis not present

## 2024-01-24 DIAGNOSIS — R072 Precordial pain: Secondary | ICD-10-CM | POA: Diagnosis not present

## 2024-01-24 DIAGNOSIS — Z96612 Presence of left artificial shoulder joint: Secondary | ICD-10-CM | POA: Diagnosis not present

## 2024-01-24 DIAGNOSIS — G4489 Other headache syndrome: Secondary | ICD-10-CM | POA: Diagnosis not present

## 2024-01-24 DIAGNOSIS — J9811 Atelectasis: Secondary | ICD-10-CM | POA: Diagnosis not present

## 2024-01-24 DIAGNOSIS — Z471 Aftercare following joint replacement surgery: Secondary | ICD-10-CM | POA: Diagnosis not present

## 2024-01-24 DIAGNOSIS — R0789 Other chest pain: Secondary | ICD-10-CM | POA: Diagnosis not present

## 2024-01-24 LAB — CBC
HCT: 41.9 % (ref 39.0–52.0)
Hemoglobin: 13.6 g/dL (ref 13.0–17.0)
MCH: 31.1 pg (ref 26.0–34.0)
MCHC: 32.5 g/dL (ref 30.0–36.0)
MCV: 95.9 fL (ref 80.0–100.0)
Platelets: 187 10*3/uL (ref 150–400)
RBC: 4.37 MIL/uL (ref 4.22–5.81)
RDW: 13.5 % (ref 11.5–15.5)
WBC: 9.3 10*3/uL (ref 4.0–10.5)
nRBC: 0 % (ref 0.0–0.2)

## 2024-01-24 LAB — BASIC METABOLIC PANEL WITH GFR
Anion gap: 9 (ref 5–15)
BUN: 16 mg/dL (ref 8–23)
CO2: 27 mmol/L (ref 22–32)
Calcium: 8.7 mg/dL — ABNORMAL LOW (ref 8.9–10.3)
Chloride: 101 mmol/L (ref 98–111)
Creatinine, Ser: 1.22 mg/dL (ref 0.61–1.24)
GFR, Estimated: >60 mL/min (ref >60)
Glucose, Bld: 120 mg/dL — ABNORMAL HIGH (ref 70–99)
Potassium: 4.3 mmol/L (ref 3.5–5.1)
Sodium: 137 mmol/L (ref 135–145)

## 2024-01-24 LAB — TROPONIN I (HIGH SENSITIVITY)
Troponin I (High Sensitivity): 3 ng/L (ref <18)
Troponin I (High Sensitivity): <2 ng/L (ref <18)

## 2024-01-24 MED ORDER — ACETAMINOPHEN 325 MG PO TABS
650.0000 mg | ORAL_TABLET | Freq: Once | ORAL | Status: AC
  Administered 2024-01-24: 650 mg via ORAL
  Filled 2024-01-24: qty 2

## 2024-01-24 NOTE — ED Notes (Signed)
PTAR CALLED  °

## 2024-01-24 NOTE — ED Notes (Signed)
 Called [facility] for care handoff. NA.

## 2024-01-24 NOTE — ED Provider Triage Note (Signed)
 Emergency Medicine Provider Triage Evaluation Note  Nicholas [name] , a 73 y.o. male  was evaluated in triage.  Pt complains of chest pain.  Patient reports pain at the chest for the past hour and headache.  States that the facility he lives at has been causing him a lot of stress recently and thinks that that is what his pain can be associated with.  Has history of migraines in the past and states that this one is located on the sides of his head rather than the forehead but does not feel more severe than his regular ones.  Took aspirin and nitro with EMS without improvement of symptoms.  Review of Systems  Positive: Chest pain, headache Negative: Vision changes, weakness, shortness of breath  Physical Exam  BP 117/67 (BP Location: Left Arm)   Pulse 80   Temp 98 F (36.7 C) (Oral)   Resp 18   Ht 6' (1.829 m)   Wt 123.4 kg   SpO2 97%   BMI 36.89 kg/m  Gen:   Awake, no distress   Resp:  Normal effort  MSK:   Moves extremities without difficulty  Other:    Medical Decision Making  Medically screening exam initiated at 12:25 PM.  Appropriate orders placed.  Nicholas [name] was informed that the remainder of the evaluation will be completed by another provider, this initial triage assessment does not replace that evaluation, and the importance of remaining in the ED until their evaluation is complete.    Nicholas Marion, PA-C 01/24/24 1227

## 2024-01-24 NOTE — ED Provider Notes (Signed)
 [place] EMERGENCY DEPARTMENT AT [facility] Provider Note   CSN: [id number] Arrival date & time: 01/24/24  1141     History  Chief Complaint  Patient presents with   Chest Pain    LAMONTA CYPRESS is a 73 y.o. male hx of afib on eliquis, here presenting chest pain.  Patient states that he is at [place] and he is under a lot of stress right now.  He was concerned about nursing shortage and also some the residents there.  He states that this morning he had some substernal chest pain.  He states that he was given some nitro and it resolved.  He has seen cardiology previously and had an unremarkable angiogram.  He states that he never had a stent placed in his heart.  The history is provided by the patient.       Home Medications Prior to Admission medications   Medication Sig Start Date End Date Taking? Authorizing Provider  acetaminophen (TYLENOL) 500 MG tablet Take 1,000 mg by mouth 3 (three) times daily.    [provider]  amitriptyline (ELAVIL) 50 MG tablet Take 0.5 tablets (25 mg total) by mouth in the morning and at bedtime. 03/08/23   [name], MD  apixaban (ELIQUIS) 5 MG TABS tablet Take 1 tablet (5 mg total) by mouth 2 (two) times daily. 09/27/22   [name], MD  atorvastatin (LIPITOR) 20 MG tablet TAKE ONE TABLET BY MOUTH ONCE DAILY Patient taking differently: Take 10 mg by mouth at bedtime. 02/21/22   [name], MD  cetirizine (ZYRTEC) 10 MG tablet Take 1 tablet (10 mg total) by mouth daily. 07/25/22   Eden Emms, NP  Cholecalciferol (VITAMIN D3) 25 MCG (1000 UT) CAPS Take 1 capsule (1,000 Units total) by mouth daily. 03/24/21   [name], MD  cyanocobalamin (VITAMIN B12) 1000 MCG tablet Take 1 tablet (1,000 mcg total) by mouth daily. 03/08/23   [name], MD  EPINEPHrine 0.3 mg/0.3 mL IJ SOAJ injection Inject 0.3 mg into the muscle as needed for anaphylaxis. (bee stings) 12/18/21   [name], MD  ferrous  sulfate 325 (65 FE) MG EC tablet Take 325 mg by mouth daily with breakfast.    [provider]  furosemide (LASIX) 20 MG tablet Take 40 mg by mouth daily. 03/08/23   [name], MD  gabapentin (NEURONTIN) 300 MG capsule Take 600 mg by mouth 3 (three) times daily. 300 mg in the am, 600 mg in the afternoon, and 600 mg in the evening 03/08/23   [name], MD  levothyroxine (SYNTHROID) 125 MCG tablet Take 1 tablet (125 mcg total) by mouth daily before breakfast. 03/08/23   [name], MD  Menthol, Topical Analgesic, (BIOFREEZE) 4 % GEL Apply 1 Application topically daily. Neck pain    [provider]  methylPREDNISolone (MEDROL DOSEPAK) 4 MG TBPK tablet Dose per box instructions 12/30/23   Sherian Maroon A, PA  metoprolol succinate (TOPROL-XL) 100 MG 24 hr tablet Take 1 tablet (100 mg total) by mouth at bedtime. Take with or immediately following a meal. 08/28/22   Lurene Shadow, MD  nitroGLYCERIN (NITROSTAT) 0.4 MG SL tablet Place 1 tablet (0.4 mg total) under the tongue every 5 (five) minutes as needed for chest pain. 11/27/21   [name], MD  omeprazole (PRILOSEC) 40 MG capsule Take 1 capsule (40 mg total) by mouth daily. 06/13/23   Iva Boop, MD  ondansetron (ZOFRAN-ODT) 4 MG disintegrating tablet TAKE ONE TABLET BY  MOUTH every EIGHT hours AS NEEDED 10/04/22   Glean Salvo, NP  Probiotic Product (PROBIOTIC DAILY) CAPS Take 1 capsule by mouth. 1 capsule in the morning every 3 days    [provider]  Rimegepant Sulfate (NURTEC) 75 MG TBDP Take 1 tablet (75 mg total) by mouth as needed. 11/21/23   Glean Salvo, NP  tamsulosin (FLOMAX) 0.4 MG CAPS capsule TAKE ONE CAPSULE BY MOUTH ONCE DAILY 11/20/22   [name], MD  tiZANidine (ZANAFLEX) 4 MG tablet TAKE ONE TABLET BY MOUTH every SIX hours AS NEEDED FOR acute HEADACHE 10/04/22   Glean Salvo, NP  Ubrogepant (UBRELVY) 100 MG TABS Take 100 mg by mouth as needed (take 1 tablet at onset of  headache, may repeat in hours, max is 200 mg in 24 hours). 07/24/22   Glean Salvo, NP      Allergies    Peanut-containing drug products, Yellow jacket venom [bee venom], Celebrex [celecoxib], Percocet [oxycodone-acetaminophen], Latex, and Nsaids    Review of Systems   Review of Systems  Cardiovascular:  Positive for chest pain.  All other systems reviewed and are negative.   Physical Exam Updated Vital Signs BP 125/82   Pulse (!) 53   Temp 98 F (36.7 C) (Oral)   Resp (!) 21   Ht 6' (1.829 m)   Wt 123.4 kg   SpO2 100%   BMI 36.89 kg/m  Physical Exam Vitals and nursing note reviewed.  Constitutional:      Appearance: He is well-developed.  HENT:     Head: Normocephalic.  Eyes:     Extraocular Movements: Extraocular movements intact.     Pupils: Pupils are equal, round, and reactive to light.  Cardiovascular:     Rate and Rhythm: Normal rate and regular rhythm.     Heart sounds: Normal heart sounds.  Pulmonary:     Effort: Pulmonary effort is normal.     Breath sounds: Normal breath sounds.  Abdominal:     General: Bowel sounds are normal.     Palpations: Abdomen is soft.  Musculoskeletal:     Cervical back: Normal range of motion and neck supple.  Skin:    General: Skin is warm.     Capillary Refill: Capillary refill takes less than 2 seconds.  Neurological:     General: No focal deficit present.     Mental Status: He is alert and oriented to person, place, and time.  Psychiatric:        Mood and Affect: Mood normal.        Behavior: Behavior normal.     ED Results / Procedures / Treatments   Labs (all labs ordered are listed, but only abnormal results are displayed) Labs Reviewed  BASIC METABOLIC PANEL WITH GFR - Abnormal; Notable for the following components:      Result Value   Glucose, Bld 120 (*)    Calcium 8.7 (*)    All other components within normal limits  CBC  TROPONIN I (HIGH SENSITIVITY)  TROPONIN I (HIGH SENSITIVITY)    EKG EKG  Interpretation Date/Time:  Friday January 24 2024 11:46:59 EDT Ventricular Rate:  64 PR Interval:    QRS Duration:  78 QT Interval:  410 QTC Calculation: 422 R Axis:   -2  Text Interpretation: Atrial fibrillation Low voltage QRS Cannot rule out Anterior infarct , age undetermined Abnormal ECG When compared with ECG of 31-Jul-2023 09:18, No significant change since last tracing Confirmed by Richardean Canal (  [id number]) on 01/24/2024 4:20:16 PM  Radiology DG Chest 2 View Result Date: 01/24/2024 CLINICAL DATA:  Chest pain. EXAM: CHEST - 2 VIEW COMPARISON:  November 05, 2022. FINDINGS: Stable cardiomediastinal silhouette. Hypoinflation of the lungs is noted with minimal bibasilar subsegmental atelectasis. Status post bilateral shoulder arthroplasties. IMPRESSION: Hypoinflation of the lungs with minimal bibasilar subsegmental atelectasis. Electronically Signed   By: Lupita Raider M.D.   On: 01/24/2024 13:56    Procedures Procedures    Medications Ordered in ED Medications  acetaminophen (TYLENOL) tablet 650 mg (650 mg Oral Given 01/24/24 1259)    ED Course/ Medical Decision Making/ A&P                                 Medical Decision Making KEANDRE LINDEN is a 73 y.o. male here presenting with chest pain.  Patient has chest pain onset this morning.  Patient is under a lot of stress.  No obvious EKG changes.  Low suspicion for ACS will get troponin x 2.  I doubt PE or dissection.  Patient does have a known thoracic aneurysm but that is monitored by his doctor.  Patient is not hypertensive and has no tearing chest pain and I do not think he needs CT dissection study.   7:01 PM Trop neg x 2. Labs unremarkable. Stable for discharge back to facility   Problems Addressed: Chest pain, unspecified type: acute illness or injury  Amount and/or Complexity of Data Reviewed Labs: ordered. Decision-making details documented in ED Course. Radiology: ordered and independent interpretation performed.  Decision-making details documented in ED Course.    Final Clinical Impression(s) / ED Diagnoses Final diagnoses:  None    Rx / DC Orders ED Discharge Orders     None         Charlynne Pander, MD 01/24/24 1902

## 2024-01-24 NOTE — Discharge Instructions (Addendum)
 As we discussed, your labs are normal today  Please follow-up with your cardiologist  Return to ER if you have worse chest pain or shortness of breath

## 2024-01-24 NOTE — ED Triage Notes (Signed)
 Pt here for CP that started an hour ago, non radiating. C/O HA. HX of migraines. EMS gave 324mg  of aspirin and 3 nitros. Baseline is in afib. Axox4.

## 2024-01-24 NOTE — ED Notes (Signed)
 Attempted second call to [facility]. NA.

## 2024-01-30 DIAGNOSIS — R0789 Other chest pain: Secondary | ICD-10-CM | POA: Diagnosis not present

## 2024-01-30 DIAGNOSIS — Z7901 Long term (current) use of anticoagulants: Secondary | ICD-10-CM | POA: Diagnosis not present

## 2024-01-30 DIAGNOSIS — E039 Hypothyroidism, unspecified: Secondary | ICD-10-CM | POA: Diagnosis not present

## 2024-01-30 DIAGNOSIS — R600 Localized edema: Secondary | ICD-10-CM | POA: Diagnosis not present

## 2024-01-30 DIAGNOSIS — I5032 Chronic diastolic (congestive) heart failure: Secondary | ICD-10-CM | POA: Diagnosis not present

## 2024-01-30 DIAGNOSIS — I959 Hypotension, unspecified: Secondary | ICD-10-CM | POA: Diagnosis not present

## 2024-01-30 DIAGNOSIS — R262 Difficulty in walking, not elsewhere classified: Secondary | ICD-10-CM | POA: Diagnosis not present

## 2024-01-30 DIAGNOSIS — I4891 Unspecified atrial fibrillation: Secondary | ICD-10-CM | POA: Diagnosis not present

## 2024-01-30 DIAGNOSIS — I714 Abdominal aortic aneurysm, without rupture, unspecified: Secondary | ICD-10-CM | POA: Diagnosis not present

## 2024-01-30 DIAGNOSIS — Z89422 Acquired absence of other left toe(s): Secondary | ICD-10-CM | POA: Diagnosis not present

## 2024-01-30 DIAGNOSIS — Z9181 History of falling: Secondary | ICD-10-CM | POA: Diagnosis not present

## 2024-02-04 NOTE — Progress Notes (Deleted)
 Cardiology Office Note:  .   Date:  02/04/2024  ID:  DJIBRIL [name], DOB [birth year], MRN [id number] PCP: Hoover Luz, MD  [facility] Providers Cardiologist:  Sammy Crisp, MD { Click to update primary MD,subspecialty MD or APP then REFRESH:1}   History of Present Illness: .   Nicholas [name] is a 73 y.o. male with a past medical history of stroke, MI with nonobstructive coronary artery disease, atrial fibrillation on apixaban , CHF, gastric ulcers, obstructive sleep apnea, who is being seen today for hospital follow-up of chest pain.   Underwent diagnostic left heart catheterization in 2019 showed no significant coronary artery disease.  At hospitalized 08/2022 for fall and found to have rapid atrial fibrillation and elevated troponin in the setting of MSSA bacteremia and COVID.  CHA2DS2-VASc score of at least 3-4 when he was started on IV heparin .  He underwent successful TEE/cardioversion.  He was then transition to apixaban  prior to discharge.  Troponin elevation was felt to be due to supply demand mismatch.  Echocardiogram revealed LVEF 55 to 60%, normal RV SF, moderate MR. He was discharged home on Toprol  and apixaban  and has since had multiple ER visits and readmissions.  He was seen in clinic 04/30/2023 with a colonoscopy being scheduled later in the week.  He had been recently diagnosed with diabetes by his doctors at [facility].  Son placed him back in SNF for rehab due to continuous fall.  He denied any anginal or anginal equivalents.  He was considered moderate risk for his procedure and the recommendation was to postpone the colonoscopy until pharmacy recommendations came through.  He was last seen in clinic 07/31/2023 by Dr. Nolan Battle.  At that time he was still residing at [facility].  He reported intermittent palpitations without any other associated symptoms.  He did discomfort in the right side of his chest which occurred sporadically and associated with physical activity.  The discomfort  improved with rest.  Discussion was had about repeating ambulatory monitoring that was deferred and he was continued on his current dose of metoprolol  and apixaban .  With his chest pain he was recommended to have a coronary CTA for further evaluation.   Was evaluated at the [facility] Emergency department with chest pain on 01/24/24.  There were no obvious EKG changes.  Low suspicion for ACS with high-sensitivity troponins negative x 2.  Other labs were unremarkable and he was considered stable for discharge back to the facility.  He returns to clinic today ROS: 10 point review of system has been reviewed and considered negative except what is listed in the HPI  Studies Reviewed: .       cCTA 08/08/2023 IMPRESSION: 1. Coronary calcium  score of 13.3. This was 18th percentile for age and sex matched control.   2. Normal coronary origin with right dominance.   3. Calcified plaque in proximal LAD causing minimal stenosis (<25%).   4. CAD-RADS 1. Consider non-atherosclerotic causes of chest pain. Consider preventive therapy and risk factor modification.   5. Mildly dilated ascending aorta, measuring 41 mm in diameter.  Echo 08/2022  1. Left ventricular ejection fraction, by estimation, is 55 to 60%. The  left ventricle has normal function. Left ventricular endocardial border  not optimally defined to evaluate regional wall motion. Left ventricular  diastolic parameters are  indeterminate.   2. Right ventricular systolic function is normal. The right ventricular  size is normal.   3. Left atrial size was mildly dilated.   4. The  mitral valve is normal in structure. Moderate mitral valve  regurgitation. No evidence of mitral stenosis.   5. The aortic valve is normal in structure. Aortic valve regurgitation is  mild. No aortic stenosis is present.   6. Aortic dilatation noted. There is mild dilatation of the ascending  aorta, measuring 42 mm.    LHC 2019 Normal coronary arteries  without any significant coronary artery disease Normal LVEDP Normal LVEF   Chest pain likely noncardiac in origin. Proceed with shoulder surgery with low cardiac risk. Risk Assessment/Calculations:    CHA2DS2-VASc Score = 6  {Confirm score is correct.  If not, click here to update score.  REFRESH note.  :1} This indicates a 9.7% annual risk of stroke. The patient's score is based upon: CHF History: 1 HTN History: 0 Diabetes History: 1 Stroke History: 2 Vascular Disease History: 1 Age Score: 1 Gender Score: 0   {This patient has a significant risk of stroke if diagnosed with atrial fibrillation.  Please consider VKA or DOAC agent for anticoagulation if the bleeding risk is acceptable.   You can also use the SmartPhrase .[email or web address] for documentation.   :[id number]} No BP recorded.  {Refresh Note OR Click here to enter BP  :1}***       Physical Exam:   VS:  There were no vitals taken for this visit.   Wt Readings from Last 3 Encounters:  01/24/24 272 lb (123.4 kg)  12/30/23 267 lb (121.1 kg)  12/26/23 270 lb (122.5 kg)    GEN: Well nourished, well developed in no acute distress NECK: No JVD; No carotid bruits CARDIAC: ***RRR, no murmurs, rubs, gallops RESPIRATORY:  Clear to auscultation without rales, wheezing or rhonchi  ABDOMEN: Soft, non-tender, non-distended EXTREMITIES:  No edema; No deformity   ASSESSMENT AND PLAN: .   Paroxysmal atrial fibrillation Atypical chest pain Dilated thoracic aorta Lower extremity edema Recurrent falls    {Are you ordering a CV Procedure (e.g. stress test, cath, DCCV, TEE, etc)?   Press F2        :[id number]}  Dispo: ***  Signed, Royston Bekele, NP

## 2024-02-05 ENCOUNTER — Ambulatory Visit: Attending: Cardiology | Admitting: Cardiology

## 2024-02-05 ENCOUNTER — Encounter: Payer: Self-pay | Admitting: Cardiology

## 2024-02-05 ENCOUNTER — Ambulatory Visit: Admitting: Cardiology

## 2024-02-05 VITALS — BP 106/62 | HR 82 | Ht 72.0 in | Wt 268.0 lb

## 2024-02-05 DIAGNOSIS — R600 Localized edema: Secondary | ICD-10-CM

## 2024-02-05 DIAGNOSIS — I5032 Chronic diastolic (congestive) heart failure: Secondary | ICD-10-CM | POA: Diagnosis not present

## 2024-02-05 DIAGNOSIS — R0789 Other chest pain: Secondary | ICD-10-CM | POA: Diagnosis not present

## 2024-02-05 DIAGNOSIS — I480 Paroxysmal atrial fibrillation: Secondary | ICD-10-CM | POA: Diagnosis not present

## 2024-02-05 DIAGNOSIS — I77810 Thoracic aortic ectasia: Secondary | ICD-10-CM

## 2024-02-05 DIAGNOSIS — R296 Repeated falls: Secondary | ICD-10-CM

## 2024-02-05 DIAGNOSIS — I4891 Unspecified atrial fibrillation: Secondary | ICD-10-CM | POA: Diagnosis not present

## 2024-02-05 DIAGNOSIS — I340 Nonrheumatic mitral (valve) insufficiency: Secondary | ICD-10-CM

## 2024-02-05 NOTE — Progress Notes (Signed)
 Cardiology Office Note:  .   Date:  02/05/2024  ID:  Nicholas [name], DOB 03/20/1951, MRN [id number] PCP: Hoover Luz, MD  [facility] Providers Cardiologist:  Sammy Crisp, MD    History of Present Illness: .   Nicholas [name] is a 73 y.o. male with a past medical history of stroke, MI with nonobstructive coronary artery disease, atrial fibrillation on apixaban , CHF, gastric ulcers, obstructive sleep apnea, who is being seen today for hospital follow-up of chest pain.   Underwent diagnostic left heart catheterization in 2019 showed no significant coronary artery disease.  At hospitalized 08/2022 for fall and found to have rapid atrial fibrillation and elevated troponin in the setting of MSSA bacteremia and COVID.  CHA2DS2-VASc score of at least 3-4 when he was started on IV heparin .  He underwent successful TEE/cardioversion.  He was then transition to apixaban  prior to discharge.  Troponin elevation was felt to be due to supply demand mismatch.  Echocardiogram revealed LVEF 55 to 60%, normal RV SF, moderate MR. He was discharged home on Toprol  and apixaban  and has since had multiple ER visits and readmissions.  He was seen in clinic 04/30/2023 with a colonoscopy being scheduled later in the week.  He had been recently diagnosed with diabetes by his doctors at [facility].  Son placed him back in SNF for rehab due to continuous fall.  He denied any anginal or anginal equivalents.  He was considered moderate risk for his procedure and the recommendation was to postpone the colonoscopy until pharmacy recommendations came through.  He was last seen in clinic 07/31/2023 by Dr. Nolan Battle.  At that time he was still residing at [facility].  He reported intermittent palpitations without any other associated symptoms.  He did discomfort in the right side of his chest which occurred sporadically and associated with physical activity.  The discomfort improved with rest.  Discussion was had about repeating ambulatory  monitoring that was deferred and he was continued on his current dose of metoprolol  and apixaban .  With his chest pain he was recommended to have a coronary CTA for further evaluation.   Was evaluated at the [facility] Emergency department with chest pain on 01/24/24.  There were no obvious EKG changes.  Low suspicion for ACS with high-sensitivity troponins negative x 2.  Other labs were unremarkable and he was considered stable for discharge back to the facility.  He returns to clinic today stating that since his recent ER visit he has felt fine. Denies any recurrent episodes of chest pain. He states that he chest pain he felt was likely secondary to his increased stress from living at a SNF. He states that there is not enough staff to care for the residents. He states that he has been trying to start a small garden area outside to give him something to do. He asked for several medication refills today. He did no come with any paperwork from the facility.  Stated that he is compliant with his current medication regimen without any adverse side effects.  States that he has not noted any missed doses of apixaban  and denies any bleeding with no blood noted in his urine or stool.  ROS: 10 point review of system has been reviewed and considered negative except what is listed in the HPI  Studies Reviewed: Aaron Aas   EKG Interpretation Date/Time:  Wednesday February 05 2024 09:07:59 EDT Ventricular Rate:  87 PR Interval:    QRS Duration:  84 QT Interval:  370 QTC Calculation: 445 R Axis:   -23  Text Interpretation: Atrial fibrillation rate controlled Low voltage QRS Nonspecific ST and T wave abnormality When compared with ECG of 24-Jan-2024 11:46, Nonspecific T wave abnormality now evident in Inferior leads Left axis deviation Confirmed by Ronald Cockayne ([id number]) on 02/05/2024 9:42:10 AM    cCTA 08/08/2023 IMPRESSION: 1. Coronary calcium  score of 13.3. This was 18th percentile for age and sex matched control.    2. Normal coronary origin with right dominance.   3. Calcified plaque in proximal LAD causing minimal stenosis (<25%).   4. CAD-RADS 1. Consider non-atherosclerotic causes of chest pain. Consider preventive therapy and risk factor modification.   5. Mildly dilated ascending aorta, measuring 41 mm in diameter.  Echo 08/2022  1. Left ventricular ejection fraction, by estimation, is 55 to 60%. The  left ventricle has normal function. Left ventricular endocardial border  not optimally defined to evaluate regional wall motion. Left ventricular  diastolic parameters are  indeterminate.   2. Right ventricular systolic function is normal. The right ventricular  size is normal.   3. Left atrial size was mildly dilated.   4. The mitral valve is normal in structure. Moderate mitral valve  regurgitation. No evidence of mitral stenosis.   5. The aortic valve is normal in structure. Aortic valve regurgitation is  mild. No aortic stenosis is present.   6. Aortic dilatation noted. There is mild dilatation of the ascending  aorta, measuring 42 mm.    LHC 2019 Normal coronary arteries without any significant coronary artery disease Normal LVEDP Normal LVEF   Chest pain likely noncardiac in origin. Proceed with shoulder surgery with low cardiac risk. Risk Assessment/Calculations:    CHA2DS2-VASc Score = 6   This indicates a 9.7% annual risk of stroke. The patient's score is based upon: CHF History: 1 HTN History: 0 Diabetes History: 1 Stroke History: 2 Vascular Disease History: 1 Age Score: 1 Gender Score: 0            Physical Exam:   VS:  BP 106/62 (BP Location: Left Arm, Patient Position: Sitting, Cuff Size: Large)   Pulse 82   Ht 6' (1.829 m)   Wt 268 lb (121.6 kg)   SpO2 93%   BMI 36.35 kg/m    Wt Readings from Last 3 Encounters:  02/05/24 268 lb (121.6 kg)  01/24/24 272 lb (123.4 kg)  12/30/23 267 lb (121.1 kg)    GEN: Well nourished, well developed in no acute  distress NECK: No JVD; No carotid bruits CARDIAC: IR IR, no murmurs, rubs, gallops RESPIRATORY:  Clear to auscultation without rales, wheezing or rhonchi  ABDOMEN: Soft, non-tender, non-distended EXTREMITIES:  No edema; No deformity   ASSESSMENT AND PLAN: .   Paroxysmal atrial fibrillation where he remains in atrial fibrillation today that is rate controlled with rate 87 with a left axis deviation nonspecific T wave T wave changes on EKG.  He denies any recurrent palpitations today.  He has been continued on apixaban  5 mg twice daily for CHA2DS2-VASc score of at least 6 for stroke prophylaxis and Toprol -XL 100 mg daily.  Atypical chest pain that is on the right side of his chest that is not triggered with stress and anxiety.  Catheterization from 2019 did not show any significant coronary artery disease.  Coronary CTA completed back in October 2024 revealed a coronary calcium  score of 13.3.  He was recently evaluated in the emergency department and workup was unrevealing with no acute EKG  changes and high-sensitivity troponins that were negative.  He has been scheduled for an updated lipid panel today for continued restratification.  Dilated thoracic aorta measuring 4 cm on coronary CTA completed 10/24.  Recommend annual imaging with follow-up CTA or MRA according to 2010 guidelines.  Lower extremity edema that is likely multifactorial including venous insufficiency.  He has been encouraged to participate in conservative therapy of elevating his extremities, foot calf pumps, compression stockings.  Falls where he denies any recent falls.  He continues to remain at [facility].  Mitral valve insufficiency with moderate mitral valve regurgitation noted on echocardiogram 08/20/2022.  He has been scheduled for an updated echocardiogram.  Will also allow us  to reassess heart function, look for wall motion abnormalities, and reassess for any further valvular abnormalities.       Dispo: Patient to return to  clinic to see MD/APP in 3 months or sooner if needed for reevaluation of symptoms.  Signed, Milo Solana, NP

## 2024-02-05 NOTE — Patient Instructions (Signed)
 Medication Instructions:  Your physician recommends that you continue on your current medications as directed. Please refer to the Current Medication list given to you today.  *If you need a refill on your cardiac medications before your next appointment, please call your pharmacy*  Lab Work: Your provider would like for you to have following labs drawn today Lipid Panel .   If you have labs (blood work) drawn today and your tests are completely normal, you will receive your results only by: MyChart Message (if you have MyChart) OR A paper copy in the mail If you have any lab test that is abnormal or we need to change your treatment, we will call you to review the results.  Testing/Procedures: Your physician has requested that you have an echocardiogram. Echocardiography is a painless test that uses sound waves to create images of your heart. It provides your doctor with information about the size and shape of your heart and how well your heart's chambers and valves are working.   You may receive an ultrasound enhancing agent through an IV if needed to better visualize your heart during the echo. This procedure takes approximately one hour.  There are no restrictions for this procedure.  This will take place at [address] [facility], [place] [address]  Please note: We ask at that you not bring children with you during ultrasound (echo/ vascular) testing. Due to room size and safety concerns, children are not allowed in the ultrasound rooms during exams. Our front office staff cannot provide observation of children in our lobby area while testing is being conducted. An adult accompanying a patient to their appointment will only be allowed in the ultrasound room at the discretion of the ultrasound technician under special circumstances. We apologize for any inconvenience.   Follow-Up: At [facility], you and your health needs are our priority.  As part of our  continuing mission to provide you with exceptional heart care, our providers are all part of one team.  This team includes your primary Cardiologist (physician) and Advanced Practice Providers or APPs (Physician Assistants and Nurse Practitioners) who all work together to provide you with the care you need, when you need it.  Your next appointment:   3 month(s)  Provider:   You may see Sammy Crisp, MD or one of the following Advanced Practice Providers on your designated Care Team:   Laneta Pintos, NP Gildardo Labrador, PA-C Varney Gentleman, PA-C Cadence Annetta South, PA-C Ronald Cockayne, NP Morey Ar, NP    We recommend signing up for the patient portal called "MyChart".  Sign up information is provided on this After Visit Summary.  MyChart is used to connect with patients for Virtual Visits (Telemedicine).  Patients are able to view lab/test results, encounter notes, upcoming appointments, etc.  Non-urgent messages can be sent to your provider as well.   To learn more about what you can do with MyChart, go to [email or web address].   Other Instructions  *Patient needs Neosporin for penis. *Needs Epi pen available when outside

## 2024-02-06 ENCOUNTER — Encounter: Payer: Self-pay | Admitting: *Deleted

## 2024-02-06 LAB — LIPID PANEL
Chol/HDL Ratio: 2.5 ratio (ref 0.0–5.0)
Cholesterol, Total: 117 mg/dL (ref 100–199)
HDL: 46 mg/dL (ref >39)
LDL Chol Calc (NIH): 56 mg/dL (ref 0–99)
Triglycerides: 73 mg/dL (ref 0–149)
VLDL Cholesterol Cal: 15 mg/dL (ref 5–40)

## 2024-02-06 NOTE — Progress Notes (Signed)
 LDL or bad cholesterol of 56 which remains at goal.  Continue on atorvastatin  20 mg daily.  No further medication changes needed at this time.

## 2024-02-08 ENCOUNTER — Telehealth: Payer: Self-pay

## 2024-02-08 NOTE — Telephone Encounter (Signed)
 Lm for patient shoes and inserts are in patient has appt on 7/15 but ppw will expire 7/10  Needs delivery / fitting appt  Britton Cane Cped, CFo, CFm

## 2024-02-10 DIAGNOSIS — I739 Peripheral vascular disease, unspecified: Secondary | ICD-10-CM | POA: Diagnosis not present

## 2024-02-10 DIAGNOSIS — R2689 Other abnormalities of gait and mobility: Secondary | ICD-10-CM | POA: Diagnosis not present

## 2024-02-11 DIAGNOSIS — R2689 Other abnormalities of gait and mobility: Secondary | ICD-10-CM | POA: Diagnosis not present

## 2024-02-11 DIAGNOSIS — I739 Peripheral vascular disease, unspecified: Secondary | ICD-10-CM | POA: Diagnosis not present

## 2024-02-12 DIAGNOSIS — R2689 Other abnormalities of gait and mobility: Secondary | ICD-10-CM | POA: Diagnosis not present

## 2024-02-12 DIAGNOSIS — I739 Peripheral vascular disease, unspecified: Secondary | ICD-10-CM | POA: Diagnosis not present

## 2024-02-13 DIAGNOSIS — I739 Peripheral vascular disease, unspecified: Secondary | ICD-10-CM | POA: Diagnosis not present

## 2024-02-13 DIAGNOSIS — R2689 Other abnormalities of gait and mobility: Secondary | ICD-10-CM | POA: Diagnosis not present

## 2024-02-14 DIAGNOSIS — I739 Peripheral vascular disease, unspecified: Secondary | ICD-10-CM | POA: Diagnosis not present

## 2024-02-14 DIAGNOSIS — R2689 Other abnormalities of gait and mobility: Secondary | ICD-10-CM | POA: Diagnosis not present

## 2024-02-17 DIAGNOSIS — R2689 Other abnormalities of gait and mobility: Secondary | ICD-10-CM | POA: Diagnosis not present

## 2024-02-17 DIAGNOSIS — I739 Peripheral vascular disease, unspecified: Secondary | ICD-10-CM | POA: Diagnosis not present

## 2024-02-18 DIAGNOSIS — I739 Peripheral vascular disease, unspecified: Secondary | ICD-10-CM | POA: Diagnosis not present

## 2024-02-18 DIAGNOSIS — R2689 Other abnormalities of gait and mobility: Secondary | ICD-10-CM | POA: Diagnosis not present

## 2024-02-19 DIAGNOSIS — R2689 Other abnormalities of gait and mobility: Secondary | ICD-10-CM | POA: Diagnosis not present

## 2024-02-19 DIAGNOSIS — I739 Peripheral vascular disease, unspecified: Secondary | ICD-10-CM | POA: Diagnosis not present

## 2024-02-20 DIAGNOSIS — R2689 Other abnormalities of gait and mobility: Secondary | ICD-10-CM | POA: Diagnosis not present

## 2024-02-20 DIAGNOSIS — I739 Peripheral vascular disease, unspecified: Secondary | ICD-10-CM | POA: Diagnosis not present

## 2024-02-21 DIAGNOSIS — R2689 Other abnormalities of gait and mobility: Secondary | ICD-10-CM | POA: Diagnosis not present

## 2024-02-21 DIAGNOSIS — I739 Peripheral vascular disease, unspecified: Secondary | ICD-10-CM | POA: Diagnosis not present

## 2024-02-24 DIAGNOSIS — I739 Peripheral vascular disease, unspecified: Secondary | ICD-10-CM | POA: Diagnosis not present

## 2024-02-24 DIAGNOSIS — R2689 Other abnormalities of gait and mobility: Secondary | ICD-10-CM | POA: Diagnosis not present

## 2024-02-25 DIAGNOSIS — I739 Peripheral vascular disease, unspecified: Secondary | ICD-10-CM | POA: Diagnosis not present

## 2024-02-25 DIAGNOSIS — R2689 Other abnormalities of gait and mobility: Secondary | ICD-10-CM | POA: Diagnosis not present

## 2024-02-26 DIAGNOSIS — R2689 Other abnormalities of gait and mobility: Secondary | ICD-10-CM | POA: Diagnosis not present

## 2024-02-26 DIAGNOSIS — I739 Peripheral vascular disease, unspecified: Secondary | ICD-10-CM | POA: Diagnosis not present

## 2024-02-27 DIAGNOSIS — R2689 Other abnormalities of gait and mobility: Secondary | ICD-10-CM | POA: Diagnosis not present

## 2024-02-27 DIAGNOSIS — I739 Peripheral vascular disease, unspecified: Secondary | ICD-10-CM | POA: Diagnosis not present

## 2024-02-28 DIAGNOSIS — I739 Peripheral vascular disease, unspecified: Secondary | ICD-10-CM | POA: Diagnosis not present

## 2024-02-28 DIAGNOSIS — R2689 Other abnormalities of gait and mobility: Secondary | ICD-10-CM | POA: Diagnosis not present

## 2024-03-02 DIAGNOSIS — I739 Peripheral vascular disease, unspecified: Secondary | ICD-10-CM | POA: Diagnosis not present

## 2024-03-02 DIAGNOSIS — R2689 Other abnormalities of gait and mobility: Secondary | ICD-10-CM | POA: Diagnosis not present

## 2024-03-03 ENCOUNTER — Encounter

## 2024-03-03 DIAGNOSIS — H26492 Other secondary cataract, left eye: Secondary | ICD-10-CM | POA: Diagnosis not present

## 2024-03-03 DIAGNOSIS — I739 Peripheral vascular disease, unspecified: Secondary | ICD-10-CM | POA: Diagnosis not present

## 2024-03-03 DIAGNOSIS — H35033 Hypertensive retinopathy, bilateral: Secondary | ICD-10-CM | POA: Diagnosis not present

## 2024-03-03 DIAGNOSIS — R2689 Other abnormalities of gait and mobility: Secondary | ICD-10-CM | POA: Diagnosis not present

## 2024-03-04 DIAGNOSIS — R2689 Other abnormalities of gait and mobility: Secondary | ICD-10-CM | POA: Diagnosis not present

## 2024-03-04 DIAGNOSIS — I739 Peripheral vascular disease, unspecified: Secondary | ICD-10-CM | POA: Diagnosis not present

## 2024-03-05 DIAGNOSIS — R2689 Other abnormalities of gait and mobility: Secondary | ICD-10-CM | POA: Diagnosis not present

## 2024-03-05 DIAGNOSIS — I739 Peripheral vascular disease, unspecified: Secondary | ICD-10-CM | POA: Diagnosis not present

## 2024-03-06 DIAGNOSIS — I739 Peripheral vascular disease, unspecified: Secondary | ICD-10-CM | POA: Diagnosis not present

## 2024-03-06 DIAGNOSIS — R2689 Other abnormalities of gait and mobility: Secondary | ICD-10-CM | POA: Diagnosis not present

## 2024-03-12 ENCOUNTER — Ambulatory Visit

## 2024-03-12 ENCOUNTER — Telehealth: Payer: Self-pay

## 2024-03-12 NOTE — Telephone Encounter (Signed)
 LM again for patient that shoes are in and needs a fitting appt. Ppw WILL expire on 7.10.25 and needs to come in before that for delivery  I left phone number for him to call and schedule  Britton Cane Cped, CFo, CFm

## 2024-03-13 DIAGNOSIS — S60511A Abrasion of right hand, initial encounter: Secondary | ICD-10-CM | POA: Diagnosis not present

## 2024-03-13 DIAGNOSIS — R319 Hematuria, unspecified: Secondary | ICD-10-CM | POA: Diagnosis not present

## 2024-03-13 DIAGNOSIS — L209 Atopic dermatitis, unspecified: Secondary | ICD-10-CM | POA: Diagnosis not present

## 2024-03-13 DIAGNOSIS — S60512A Abrasion of left hand, initial encounter: Secondary | ICD-10-CM | POA: Diagnosis not present

## 2024-03-13 DIAGNOSIS — L259 Unspecified contact dermatitis, unspecified cause: Secondary | ICD-10-CM | POA: Diagnosis not present

## 2024-03-16 DIAGNOSIS — H903 Sensorineural hearing loss, bilateral: Secondary | ICD-10-CM | POA: Diagnosis not present

## 2024-03-24 ENCOUNTER — Ambulatory Visit: Attending: Cardiology

## 2024-03-24 DIAGNOSIS — I340 Nonrheumatic mitral (valve) insufficiency: Secondary | ICD-10-CM | POA: Diagnosis not present

## 2024-03-25 LAB — ECHOCARDIOGRAM COMPLETE
AR max vel: 3.66 cm2
AV Area VTI: 3.25 cm2
AV Area mean vel: 3.38 cm2
AV Mean grad: 3 mmHg
AV Peak grad: 4.6 mmHg
Ao pk vel: 1.07 m/s
Area-P 1/2: 3.42 cm2
Calc EF: 58.9 %
P 1/2 time: 903 ms
S' Lateral: 3.6 cm
Single Plane A2C EF: 51.8 %
Single Plane A4C EF: 61.8 %

## 2024-03-26 ENCOUNTER — Ambulatory Visit: Payer: Self-pay | Admitting: Cardiology

## 2024-03-26 NOTE — Progress Notes (Signed)
 Heart squeeze is -55 to 60%, normal function, no wall motion abnormalities were noted, mild leakage in the mitral valve which is typically seen at age 73, there is mild dilatation of the aortic root measuring 43 mm and mild dilatation of the ascending aorta measuring 42 mm.  Unchanged from study in 2023.  Overall reassuring study with no findings to suggest symptoms of chest pain or edema.

## 2024-03-27 DIAGNOSIS — R262 Difficulty in walking, not elsewhere classified: Secondary | ICD-10-CM | POA: Diagnosis not present

## 2024-03-27 DIAGNOSIS — Z9181 History of falling: Secondary | ICD-10-CM | POA: Diagnosis not present

## 2024-03-27 DIAGNOSIS — I5032 Chronic diastolic (congestive) heart failure: Secondary | ICD-10-CM | POA: Diagnosis not present

## 2024-03-27 DIAGNOSIS — I480 Paroxysmal atrial fibrillation: Secondary | ICD-10-CM | POA: Diagnosis not present

## 2024-03-27 DIAGNOSIS — L209 Atopic dermatitis, unspecified: Secondary | ICD-10-CM | POA: Diagnosis not present

## 2024-03-27 DIAGNOSIS — L259 Unspecified contact dermatitis, unspecified cause: Secondary | ICD-10-CM | POA: Diagnosis not present

## 2024-03-27 DIAGNOSIS — R0789 Other chest pain: Secondary | ICD-10-CM | POA: Diagnosis not present

## 2024-03-27 DIAGNOSIS — E039 Hypothyroidism, unspecified: Secondary | ICD-10-CM | POA: Diagnosis not present

## 2024-03-27 DIAGNOSIS — I714 Abdominal aortic aneurysm, without rupture, unspecified: Secondary | ICD-10-CM | POA: Diagnosis not present

## 2024-03-27 DIAGNOSIS — R9389 Abnormal findings on diagnostic imaging of other specified body structures: Secondary | ICD-10-CM | POA: Diagnosis not present

## 2024-04-13 DIAGNOSIS — Z23 Encounter for immunization: Secondary | ICD-10-CM | POA: Diagnosis not present

## 2024-04-21 ENCOUNTER — Ambulatory Visit (INDEPENDENT_AMBULATORY_CARE_PROVIDER_SITE_OTHER)

## 2024-04-21 DIAGNOSIS — Z89422 Acquired absence of other left toe(s): Secondary | ICD-10-CM

## 2024-04-21 DIAGNOSIS — M2042 Other hammer toe(s) (acquired), left foot: Secondary | ICD-10-CM

## 2024-04-21 DIAGNOSIS — E1142 Type 2 diabetes mellitus with diabetic polyneuropathy: Secondary | ICD-10-CM | POA: Diagnosis not present

## 2024-04-21 DIAGNOSIS — M2142 Flat foot [pes planus] (acquired), left foot: Secondary | ICD-10-CM

## 2024-04-21 DIAGNOSIS — M2141 Flat foot [pes planus] (acquired), right foot: Secondary | ICD-10-CM | POA: Diagnosis not present

## 2024-04-21 DIAGNOSIS — Z899 Acquired absence of limb, unspecified: Secondary | ICD-10-CM

## 2024-04-21 DIAGNOSIS — M2041 Other hammer toe(s) (acquired), right foot: Secondary | ICD-10-CM

## 2024-04-21 NOTE — Progress Notes (Signed)
 Patient presents today to pick up diabetic shoes and insoles.  Patient was dispensed 1 pair of diabetic shoes toefiller for Left 1-2 amputation and 3ea custom diabetic insoles. Fit was satisfactory. Instructions for break-in and wear was reviewed and a copy was given to the patient.   Re-appointment for regularly scheduled diabetic foot care visits or if they should experience any trouble with the shoes or insoles.    Lolita Schultze Cped, CFo, CFm

## 2024-04-22 DIAGNOSIS — R0989 Other specified symptoms and signs involving the circulatory and respiratory systems: Secondary | ICD-10-CM | POA: Diagnosis not present

## 2024-04-23 DIAGNOSIS — I5032 Chronic diastolic (congestive) heart failure: Secondary | ICD-10-CM | POA: Diagnosis not present

## 2024-04-23 DIAGNOSIS — R9389 Abnormal findings on diagnostic imaging of other specified body structures: Secondary | ICD-10-CM | POA: Diagnosis not present

## 2024-04-23 DIAGNOSIS — M940 Chondrocostal junction syndrome [Tietze]: Secondary | ICD-10-CM | POA: Diagnosis not present

## 2024-04-23 DIAGNOSIS — L293 Anogenital pruritus, unspecified: Secondary | ICD-10-CM | POA: Diagnosis not present

## 2024-04-23 DIAGNOSIS — J849 Interstitial pulmonary disease, unspecified: Secondary | ICD-10-CM | POA: Diagnosis not present

## 2024-04-23 DIAGNOSIS — R0789 Other chest pain: Secondary | ICD-10-CM | POA: Diagnosis not present

## 2024-04-24 ENCOUNTER — Telehealth: Payer: Self-pay | Admitting: Internal Medicine

## 2024-04-24 ENCOUNTER — Ambulatory Visit: Admitting: Internal Medicine

## 2024-04-24 DIAGNOSIS — J849 Interstitial pulmonary disease, unspecified: Secondary | ICD-10-CM

## 2024-04-24 DIAGNOSIS — R053 Chronic cough: Secondary | ICD-10-CM

## 2024-04-24 DIAGNOSIS — R0609 Other forms of dyspnea: Secondary | ICD-10-CM

## 2024-04-24 LAB — PULMONARY FUNCTION TEST
DL/VA % pred: 88 %
DL/VA: 3.49 ml/min/mmHg/L
DLCO unc % pred: 40 %
DLCO unc: 10.96 ml/min/mmHg
FEF 25-75 Post: 1.12 L/s
FEF 25-75 Pre: 1.37 L/s
FEF2575-%Change-Post: -18 %
FEF2575-%Pred-Post: 44 %
FEF2575-%Pred-Pre: 54 %
FEV1-%Change-Post: 14 %
FEV1-%Pred-Post: 50 %
FEV1-%Pred-Pre: 44 %
FEV1-Post: 1.72 L
FEV1-Pre: 1.5 L
FEV1FVC-%Change-Post: 3 %
FEV1FVC-%Pred-Pre: 106 %
FEV6-%Change-Post: 12 %
FEV6-%Pred-Post: 48 %
FEV6-%Pred-Pre: 43 %
FEV6-Post: 2.13 L
FEV6-Pre: 1.89 L
FEV6FVC-%Change-Post: 0 %
FEV6FVC-%Pred-Post: 105 %
FEV6FVC-%Pred-Pre: 105 %
FVC-%Change-Post: 11 %
FVC-%Pred-Post: 45 %
FVC-%Pred-Pre: 41 %
FVC-Post: 2.14 L
FVC-Pre: 1.93 L
Post FEV1/FVC ratio: 80 %
Post FEV6/FVC ratio: 100 %
Pre FEV1/FVC ratio: 78 %
Pre FEV6/FVC Ratio: 99 %

## 2024-04-24 NOTE — Progress Notes (Signed)
 Full pft attempted today, only complete Spiro pre/post and DLCO.

## 2024-04-24 NOTE — Patient Instructions (Signed)
 Full pft attempted today, only complete Spiro pre/post and DLCO.

## 2024-04-24 NOTE — Telephone Encounter (Signed)
 Pt was in today for PFT. Would like to know results, I made a 30 min ILD appt for him in mid-October. He wonders if Dr. Rudolfo want to see him sooner based on his condition.   Also, He left several records here up front that I will put in Dr. Ammie box.

## 2024-04-27 DIAGNOSIS — I10 Essential (primary) hypertension: Secondary | ICD-10-CM | POA: Diagnosis not present

## 2024-04-28 ENCOUNTER — Encounter: Payer: Self-pay | Admitting: Podiatry

## 2024-04-28 ENCOUNTER — Ambulatory Visit (INDEPENDENT_AMBULATORY_CARE_PROVIDER_SITE_OTHER): Admitting: Podiatry

## 2024-04-28 DIAGNOSIS — M79675 Pain in left toe(s): Secondary | ICD-10-CM | POA: Diagnosis not present

## 2024-04-28 DIAGNOSIS — L84 Corns and callosities: Secondary | ICD-10-CM | POA: Diagnosis not present

## 2024-04-28 DIAGNOSIS — B351 Tinea unguium: Secondary | ICD-10-CM | POA: Diagnosis not present

## 2024-04-28 DIAGNOSIS — E1142 Type 2 diabetes mellitus with diabetic polyneuropathy: Secondary | ICD-10-CM | POA: Diagnosis not present

## 2024-04-28 DIAGNOSIS — M79674 Pain in right toe(s): Secondary | ICD-10-CM

## 2024-04-28 NOTE — Progress Notes (Signed)
 Subjective:  Patient ID: Nicholas [name], male    DOB: September 25, 1951,   MRN: [id number]  Chief Complaint  Patient presents with   Diabetes    Do my nails and the bottom of  my feet.  Saw Dr. Devoria Maffucci - 04/21/2024; A1c - ?    73 y.o. male presents for concern of thickened elongated and painful nails that are difficult to trim. Requesting to have them trimmed today. Relates burning and tingling in their feet. Patient relates he was recently diangosed with diabetes and unclear from chart. No recent A1c. He is on eliquis  and at risk for routine foot care  PCP:  Maffucci Devoria LABOR, MD    . Denies any other pedal complaints. Denies n/v/f/c.   Past Medical History:  Diagnosis Date   Acute kidney failure 08/2008   cleared up  no problems since   Acute meningitis 11/06/2022   Allergy    Anxiety    Arthritis    Asthma    ?of this no inhaler   Atrial fibrillation (HCC)    Blood transfusion    CHF (congestive heart failure) (HCC)    ?of this, pt denies   CLOSTRIDIUM DIFFICILE COLITIS 07/04/2010   Annotation: 12/09, 2/10 Qualifier: Diagnosis of  By: [name], [name]     COVID-19 08/12/22 completed Paxlovid  08/20/22 08/26/2022   Depression with anxiety    Duodenitis determined by biopsy 02/2016   peptic likely due to aleve (erosive gastropathy with duodenal erosions)   ECZEMA 07/04/2010   Qualifier: Diagnosis of  By: [name], [name]     Elevated liver enzymes    Fatty liver    Full dentures    GERD (gastroesophageal reflux disease)    Heart attack (HCC)    08/2008 (likely demand ischemia in the setting of MSSA sepsis/R TKA  infection)10-2008   History of hiatal hernia    History of stomach ulcers    Hypothyroidism    Lower GI bleeding    MALAR AND MAXILLARY BONES CLOSED FRACTURE 07/04/2010   Annotation: ORIF Qualifier: Diagnosis of  By: [name], [name]     Migraine    definitely   MRSA (methicillin resistant Staphylococcus aureus)    in leg, had to place steel rod in  leg   Personal history of colonic adenoma 06/01/2003   PFO (patent foramen ovale)    small PFO by 08/2008 TEE   Pneumonia 08/2008   while in ICU   Pneumonia due to COVID-19 virus 08/12/2022   Seizures (HCC) 2009   long time ago     Sleep apnea    does not wear CPAP   Stroke (HCC) 2010   unable to complete sentences at times   Wears glasses     Objective:  Physical Exam: Vascular: DP/PT pulses 2/4 bilateral. CFT <3 seconds. Absent hair growth on digits. Edema noted to bilateral lower extremities. Xerosis noted bilaterally.  Skin. No lacerations or abrasions bilateral feet. Nails 3-5 bilateral  and 1,2 on right are thickened discolored and elongated with subungual debris. Hyperkeratotic lesion noted sub first metatarsal head on left.  Musculoskeletal: MMT 5/5 bilateral lower extremities in DF, PF, Inversion and Eversion. Deceased ROM in DF of ankle joint. Left hallux and second digit amputated.  Neurological: Sensation intact to light touch. Protective sensation diminished bilateral.    Assessment:   1. Pain due to onychomycosis of toenails of both feet   2. Type 2 diabetes mellitus with peripheral neuropathy (HCC)   3. Corns and  callosities       Plan:  Patient was evaluated and treated and all questions answered. -Discussed and educated patient on diabetic foot care, especially with  regards to the vascular, neurological and musculoskeletal systems.  -Stressed the importance of good glycemic control and the detriment of not  controlling glucose levels in relation to the foot. -Discussed supportive shoes at all times and checking feet regularly.  -Mechanically debrided all nails 1-5 bilateral using sterile nail nipper and filed with dremel without incident  -Hyperkeratotic lesion to plantar left first metatarsal head debrided today without incident with chisel.  -Answered all patient questions -Patient to return  in 3 months for at risk foot care -Patient advised to  call the office if any problems or questions arise in the meantime.   Asberry Failing, DPM

## 2024-05-02 NOTE — Telephone Encounter (Signed)
     Allergy panel negative Reepat autoimmune negative CT shows prob UIP PFT are restrictive - more than I anticipated  A Likely IPF Possible nodular GGO - not well defined  Pl;an - keep OCt appt with me  - but next appt needs to be in next 4 weeks -> with MR: video ok, 4.30pm,  OR - an APP (video ok) to give provisional diagnosis and start anti-fibrotic  (CANNOT WAIT TILL OCT for this decision)  - please have Bailey/Shelby look at my ILD clinic schedule -> E2c2 has made many errors with my scheduling. IT will say full but ehre are are many 15 min paitents in 30 min slots  - APP or me will have also give CT results and plan a short term CT in 6 months of so  xxxxxxxxxxxxxxxxxxxxxx   IMPRESSION:   Lungs/Pleura: Peripheral and possibly minimally basilar predominant subpleural reticulation, coarsening ground-glass and traction bronchiolectasis. Findings appear minimally progressive, notably in the lingula and left lower lobe. New rounded ground-glass density in the posterior left lower lobe (9/108). No honeycombing. No pleural fluid. Airway is unremarkable. Mild air trapping.    1. Pulmonary parenchymal pattern of fibrosis, as detailed above, minimally progressive from 08/08/2023. Findings are categorized as probable UIP per consensus guidelines: Diagnosis of Idiopathic Pulmonary Fibrosis: An Official ATS/ERS/JRS/ALAT Clinical Practice Guideline. Am JINNY Honey Crit Care Med Vol 198, Iss 5, [id number], Jun 15 2017. 2. New rounded ground-glass density in the posterior left lower lobe may be infectious/inflammatory in etiology. Recommend attention on follow-up or in 1 year as malignancy cannot be definitively excluded. 3. Aortic atherosclerosis (ICD10-I70.0). Left anterior descending coronary artery calcification. 4. Enlarged pulmonic trunk, indicative of pulmonary arterial hypertension.     Electronically Signed   By: Newell Eke M.D.   On: 02/05/2024 09:03       Latest Ref Rng & Units 04/24/2024    8:39 AM  PFT Results  FVC-Pre L 1.93  P  FVC-Predicted Pre % 41  P  FVC-Post L 2.14  P  FVC-Predicted Post % 45  P  Pre FEV1/FVC % % 78  P  Post FEV1/FCV % % 80  P  FEV1-Pre L 1.50  P  FEV1-Predicted Pre % 44  P  FEV1-Post L 1.72  P  DLCO uncorrected ml/min/mmHg 10.96  P  DLCO UNC% % 40  P  DLVA Predicted % 88  P    P Preliminary result

## 2024-05-04 NOTE — Telephone Encounter (Signed)
 PT was not feeling well when I called. States e has been coughing very bad and asked that I call him back.Appt's avail w Candis in 4 weeks.

## 2024-05-05 NOTE — Telephone Encounter (Signed)
 Not well today. Call Friday.

## 2024-05-06 DIAGNOSIS — I10 Essential (primary) hypertension: Secondary | ICD-10-CM | POA: Diagnosis not present

## 2024-05-07 ENCOUNTER — Ambulatory Visit: Admitting: Internal Medicine

## 2024-05-08 DIAGNOSIS — R2689 Other abnormalities of gait and mobility: Secondary | ICD-10-CM | POA: Diagnosis not present

## 2024-05-08 DIAGNOSIS — I739 Peripheral vascular disease, unspecified: Secondary | ICD-10-CM | POA: Diagnosis not present

## 2024-05-11 DIAGNOSIS — D500 Iron deficiency anemia secondary to blood loss (chronic): Secondary | ICD-10-CM | POA: Diagnosis not present

## 2024-05-12 ENCOUNTER — Encounter: Payer: Self-pay | Admitting: Cardiology

## 2024-05-12 ENCOUNTER — Ambulatory Visit: Attending: Cardiology | Admitting: Cardiology

## 2024-05-12 VITALS — BP 101/65 | HR 75 | Ht 72.0 in | Wt 263.6 lb

## 2024-05-12 DIAGNOSIS — I480 Paroxysmal atrial fibrillation: Secondary | ICD-10-CM | POA: Diagnosis not present

## 2024-05-12 DIAGNOSIS — I340 Nonrheumatic mitral (valve) insufficiency: Secondary | ICD-10-CM | POA: Diagnosis not present

## 2024-05-12 DIAGNOSIS — I77810 Thoracic aortic ectasia: Secondary | ICD-10-CM | POA: Diagnosis not present

## 2024-05-12 DIAGNOSIS — R0789 Other chest pain: Secondary | ICD-10-CM

## 2024-05-12 DIAGNOSIS — R600 Localized edema: Secondary | ICD-10-CM | POA: Diagnosis not present

## 2024-05-12 NOTE — Telephone Encounter (Signed)
 Called PT on 05/11/24. PT answered and is ok to keep his current APT with Dr.Ramaswammy. PT APT was added to wait list.

## 2024-05-12 NOTE — Patient Instructions (Signed)
 Medication Instructions:  Your physician recommends that you continue on your current medications as directed. Please refer to the Current Medication list given to you today.   *If you need a refill on your cardiac medications before your next appointment, please call your pharmacy*  Lab Work: No labs ordered today  If you have labs (blood work) drawn today and your tests are completely normal, you will receive your results only by: MyChart Message (if you have MyChart) OR A paper copy in the mail If you have any lab test that is abnormal or we need to change your treatment, we will call you to review the results.  Testing/Procedures: No test ordered today   Follow-Up: At [facility], you and your health needs are our priority.  As part of our continuing mission to provide you with exceptional heart care, our providers are all part of one team.  This team includes your primary Cardiologist (physician) and Advanced Practice Providers or APPs (Physician Assistants and Nurse Practitioners) who all work together to provide you with the care you need, when you need it.  Your next appointment:   6 month(s)  Provider:   Lonni Hanson, MD or Tylene Lunch, NP    We recommend signing up for the patient portal called MyChart.  Sign up information is provided on this After Visit Summary.  MyChart is used to connect with patients for Virtual Visits (Telemedicine).  Patients are able to view lab/test results, encounter notes, upcoming appointments, etc.  Non-urgent messages can be sent to your provider as well.   To learn more about what you can do with MyChart, go to [email or web address].

## 2024-05-12 NOTE — Progress Notes (Signed)
 Cardiology Office Note   Date:  05/12/2024  ID:  Varian, Innes 08-06-1951, MRN [id number] PCP: [name], MD  [facility] Providers Cardiologist:  Lonni Hanson, MD     History of Present Illness Nicholas [name] is a 74 y.o. male with a past medical history of stroke, MI with nonobstructive coronary disease, atrial fibrillation on chronic apixaban , CHF, gastric ulcers, obstructive sleep apnea, who is being seen today for follow-up.   Underwent diagnostic left heart catheterization in 2019 showed no significant coronary artery disease.  At hospitalized 08/2022 for fall and found to have rapid atrial fibrillation and elevated troponin in the setting of MSSA bacteremia and COVID.  CHA2DS2-VASc score of at least 3-4 when he was started on IV heparin .  He underwent successful TEE/cardioversion.  He was then transition to apixaban  prior to discharge.  Troponin elevation was felt to be due to supply demand mismatch.  Echocardiogram revealed LVEF 55 to 60%, normal RV SF, moderate MR. He was discharged home on Toprol  and apixaban  and has since had multiple ER visits and readmissions.  He was seen in clinic 04/30/2023 with a colonoscopy being scheduled later in the week.  He had been recently diagnosed with diabetes by his doctors at [facility].  Son placed him back in SNF for rehab due to continuous fall.  He denied any anginal or anginal equivalents.  He was considered moderate risk for his procedure and the recommendation was to postpone the colonoscopy until pharmacy recommendations came through.  He was last seen in clinic 07/31/2023 by Dr. Hanson.  At that time he was still residing at [facility].  He reported intermittent palpitations without any other associated symptoms.  He did discomfort in the right side of his chest which occurred sporadically and associated with physical activity.  The discomfort improved with rest.  Discussion was had about repeating ambulatory monitoring that was deferred  and he was continued on his current dose of metoprolol  and apixaban .  With his chest pain he was recommended to have a coronary CTA for further evaluation.  He was evaluated in the [facility] emergency department with chest pain 01/24/2024.  There were no obvious EKG changes.  Low suspicion for ACS with high-sensitivity troponins negative x 2.  Other labs were unremarkable and he was considered stable for discharge back to the facility.   He was last seen in clinic 02/05/24 stating that since his recent emergency department visit he felt fine.  Denies any recurrent episodes of chest pain.  Felt his discomfort was likely secondary to increased stress from living at a skilled nursing facility.  He was continued on his current medication regimen without any changes being made.  He was also scheduled for an updated echocardiogram and blood work.  He returns clinic today stating that overall he has been doing fairly well.  He continues to have increased amount of stress dealing with a residence at the skilled nursing facility.  He states that his atrial fibrillation with palpitations has given him epigastric chest discomfort at the very top of his stomach when he is stressed out.  States he does not have these episodes at rest or when he is not stressed.  He stated when he recently had his echocardiogram he had several questions about atrial fibrillation would like to discuss those today.  He states he has been compliant with his current medication regimen as he is given his medications at the facility.  States that he has not missed  any doses of his apixaban .  Denies any bleeding with no blood noted in his stool or urine.  ROS: 10 point review of systems has been reviewed and considered negative the exception was been listed in the HPI  Studies Reviewed EKG Interpretation Date/Time:  Tuesday May 12 2024 13:33:39 EDT Ventricular Rate:  75 PR Interval:    QRS Duration:  82 QT Interval:  384 QTC  Calculation: 428 R Axis:   -28  Text Interpretation: Atrial fibrillation Nonspecific T wave abnormality When compared with ECG of 05-Feb-2024 09:07, No significant change was found Confirmed by Gerard Frederick ([id number]) on 05/12/2024 1:38:06 PM    2D echo 03/24/2024 1. Left ventricular ejection fraction, by estimation, is 55 to 60%. Left  ventricular ejection fraction by 2D MOD biplane is 58.9 %. The left  ventricle has normal function. The left ventricle has no regional wall  motion abnormalities. Left ventricular  diastolic parameters are indeterminate.   2. Right ventricular systolic function is normal. The right ventricular  size is moderately enlarged.   3. Left atrial size was mildly dilated.   4. The mitral valve is normal in structure. Mild mitral valve  regurgitation.   5. The aortic valve was not well visualized. Aortic valve regurgitation  is mild.   6. Aortic dilatation noted. There is mild dilatation of the aortic root,  measuring 43 mm. There is mild dilatation of the ascending aorta,  measuring 42 mm.   cCTA 08/08/2023 IMPRESSION: 1. Coronary calcium  score of 13.3. This was 18th percentile for age and sex matched control.   2. Normal coronary origin with right dominance.   3. Calcified plaque in proximal LAD causing minimal stenosis (<25%).   4. CAD-RADS 1. Consider non-atherosclerotic causes of chest pain. Consider preventive therapy and risk factor modification.   5. Mildly dilated ascending aorta, measuring 41 mm in diameter.   Echo 08/2022  1. Left ventricular ejection fraction, by estimation, is 55 to 60%. The  left ventricle has normal function. Left ventricular endocardial border  not optimally defined to evaluate regional wall motion. Left ventricular  diastolic parameters are  indeterminate.   2. Right ventricular systolic function is normal. The right ventricular  size is normal.   3. Left atrial size was mildly dilated.   4. The mitral valve is normal  in structure. Moderate mitral valve  regurgitation. No evidence of mitral stenosis.   5. The aortic valve is normal in structure. Aortic valve regurgitation is  mild. No aortic stenosis is present.   6. Aortic dilatation noted. There is mild dilatation of the ascending  aorta, measuring 42 mm.    LHC 2019 Normal coronary arteries without any significant coronary artery disease Normal LVEDP Normal LVEF   Chest pain likely noncardiac in origin. Proceed with shoulder surgery with low cardiac risk. Risk Assessment/Calculations  CHA2DS2-VASc Score = 6   This indicates a 9.7% annual risk of stroke. The patient's score is based upon: CHF History: 1 HTN History: 0 Diabetes History: 1 Stroke History: 2 Vascular Disease History: 1 Age Score: 1 Gender Score: 0            Physical Exam VS:  BP 101/65   Pulse 75   Ht 6' (1.829 m)   Wt 263 lb 9.6 oz (119.6 kg)   SpO2 90%   BMI 35.75 kg/m        Wt Readings from Last 3 Encounters:  05/12/24 263 lb 9.6 oz (119.6 kg)  02/05/24 268 lb (121.6 kg)  01/24/24 272 lb (123.4 kg)    GEN: Well nourished, well developed in no acute distress NECK: No JVD; No carotid bruits CARDIAC: IR IR, no murmurs, rubs, gallops RESPIRATORY:  Clear to auscultation without rales, wheezing or rhonchi  ABDOMEN: Soft, non-tender, non-distended EXTREMITIES:  No edema; No deformity   ASSESSMENT AND PLAN Paroxysmal atrial fibrillation where he remains in atrial fibrillation today that is rate controlled.  EKG reveals atrial fibrillation nonspecific T waves with left axis deviation with no changes from prior studies.  He has been continued on apixaban  5 mg twice daily for CHA2DS2-VASc score of at least 6 for stroke prophylaxis and Toprol -XL 100 mg daily.  He has several questions today about options to alleviate his atrial fibrillation.  He recently underwent successful cardioversion and then converted back into atrial fibrillation.  Would like to discuss options  related to different medications or possible ablation procedure.  He has been referred to EP.  Atypical chest pain on the right side of his chest and epigastric related to stress and anxiety.  Heart catheterization completed in 2019 did not show any significant coronary disease.  Coronary CTA completed back in October 2024 revealed a coronary calcium  score of 13.3.  Recent emergency department evaluation was negative.  He has continued on apixaban  and lieu of aspirin  and atorvastatin  20 mg daily.  Dilated thoracic aorta measuring 4 cm on coronary CTA completed 07/2023.  Recommend annual imaging with follow-up CTA or MRA according to 2010 guidelines.  He will need to be scheduled for an updated study in October of this year.  Lower extremity peripheral edema which is likely multifactorial including venous insufficiency.  He is encouraged to participate in conservative therapy of elevating his extremities, foot calf pumps, and compression stockings.  Mitral valve insufficiency with moderate mitral valve regurgitation noted on echocardiogram 08/2022 with repeat echocardiogram completed 03/24/2024 revealing mild mitral regurgitation with aortic root dilatation measuring 43 mm and ascending aorta measuring 42 mm which is unchanged from a study in 2023.  Will continue to monitor with surveillance studies.       Dispo: Patient return to clinic to see MD/APP in 6 months or sooner if needed for further evaluation.  Signed, Nicholas Cervenka, NP

## 2024-05-12 NOTE — Addendum Note (Signed)
 Addended by: MAUDINE SLOUGH D on: 05/12/2024 03:45 PM   Modules accepted: Orders

## 2024-06-16 ENCOUNTER — Emergency Department (HOSPITAL_COMMUNITY)

## 2024-06-16 ENCOUNTER — Emergency Department (HOSPITAL_COMMUNITY)
Admission: EM | Admit: 2024-06-16 | Discharge: 2024-06-17 | Disposition: A | Discharge status: Home / Self Care | Attending: Emergency Medicine | Admitting: Emergency Medicine

## 2024-06-16 DIAGNOSIS — M545 Low back pain, unspecified: Secondary | ICD-10-CM | POA: Insufficient documentation

## 2024-06-16 DIAGNOSIS — W19XXXA Unspecified fall, initial encounter: Secondary | ICD-10-CM | POA: Diagnosis not present

## 2024-06-16 DIAGNOSIS — Z91040 Latex allergy status: Secondary | ICD-10-CM | POA: Diagnosis not present

## 2024-06-16 DIAGNOSIS — S8992XA Unspecified injury of left lower leg, initial encounter: Secondary | ICD-10-CM | POA: Insufficient documentation

## 2024-06-16 DIAGNOSIS — R404 Transient alteration of awareness: Secondary | ICD-10-CM | POA: Diagnosis not present

## 2024-06-16 DIAGNOSIS — M4804 Spinal stenosis, thoracic region: Secondary | ICD-10-CM | POA: Diagnosis not present

## 2024-06-16 DIAGNOSIS — Z91010 Allergy to peanuts: Secondary | ICD-10-CM | POA: Insufficient documentation

## 2024-06-16 DIAGNOSIS — Z7901 Long term (current) use of anticoagulants: Secondary | ICD-10-CM | POA: Insufficient documentation

## 2024-06-16 DIAGNOSIS — L603 Nail dystrophy: Secondary | ICD-10-CM | POA: Diagnosis not present

## 2024-06-16 DIAGNOSIS — Z96612 Presence of left artificial shoulder joint: Secondary | ICD-10-CM | POA: Diagnosis not present

## 2024-06-16 DIAGNOSIS — W06XXXA Fall from bed, initial encounter: Secondary | ICD-10-CM | POA: Insufficient documentation

## 2024-06-16 DIAGNOSIS — R918 Other nonspecific abnormal finding of lung field: Secondary | ICD-10-CM | POA: Diagnosis not present

## 2024-06-16 DIAGNOSIS — L84 Corns and callosities: Secondary | ICD-10-CM | POA: Diagnosis not present

## 2024-06-16 DIAGNOSIS — M47816 Spondylosis without myelopathy or radiculopathy, lumbar region: Secondary | ICD-10-CM | POA: Diagnosis not present

## 2024-06-16 DIAGNOSIS — M160 Bilateral primary osteoarthritis of hip: Secondary | ICD-10-CM | POA: Diagnosis not present

## 2024-06-16 DIAGNOSIS — M549 Dorsalgia, unspecified: Secondary | ICD-10-CM | POA: Diagnosis not present

## 2024-06-16 DIAGNOSIS — M25561 Pain in right knee: Secondary | ICD-10-CM | POA: Diagnosis not present

## 2024-06-16 DIAGNOSIS — I739 Peripheral vascular disease, unspecified: Secondary | ICD-10-CM | POA: Diagnosis not present

## 2024-06-16 DIAGNOSIS — Z96652 Presence of left artificial knee joint: Secondary | ICD-10-CM | POA: Diagnosis not present

## 2024-06-16 DIAGNOSIS — S3992XA Unspecified injury of lower back, initial encounter: Secondary | ICD-10-CM | POA: Diagnosis not present

## 2024-06-16 DIAGNOSIS — L602 Onychogryphosis: Secondary | ICD-10-CM | POA: Diagnosis not present

## 2024-06-16 DIAGNOSIS — R0989 Other specified symptoms and signs involving the circulatory and respiratory systems: Secondary | ICD-10-CM | POA: Diagnosis not present

## 2024-06-16 DIAGNOSIS — Z043 Encounter for examination and observation following other accident: Secondary | ICD-10-CM | POA: Diagnosis not present

## 2024-06-16 DIAGNOSIS — Z743 Need for continuous supervision: Secondary | ICD-10-CM | POA: Diagnosis not present

## 2024-06-16 DIAGNOSIS — M8588 Other specified disorders of bone density and structure, other site: Secondary | ICD-10-CM | POA: Diagnosis not present

## 2024-06-16 DIAGNOSIS — M25562 Pain in left knee: Secondary | ICD-10-CM | POA: Diagnosis not present

## 2024-06-16 LAB — CBC WITH DIFFERENTIAL/PLATELET
Abs Immature Granulocytes: 0.02 K/uL (ref 0.00–0.07)
Basophils Absolute: 0.1 K/uL (ref 0.0–0.1)
Basophils Relative: 1 %
Eosinophils Absolute: 0.5 K/uL (ref 0.0–0.5)
Eosinophils Relative: 6 %
HCT: 43.6 % (ref 39.0–52.0)
Hemoglobin: 14.2 g/dL (ref 13.0–17.0)
Immature Granulocytes: 0 %
Lymphocytes Relative: 14 %
Lymphs Abs: 1.3 K/uL (ref 0.7–4.0)
MCH: 30.9 pg (ref 26.0–34.0)
MCHC: 32.6 g/dL (ref 30.0–36.0)
MCV: 94.8 fL (ref 80.0–100.0)
Monocytes Absolute: 1.1 K/uL — ABNORMAL HIGH (ref 0.1–1.0)
Monocytes Relative: 13 %
Neutro Abs: 5.8 K/uL (ref 1.7–7.7)
Neutrophils Relative %: 66 %
Platelets: 253 K/uL (ref 150–400)
RBC: 4.6 MIL/uL (ref 4.22–5.81)
RDW: 13.3 % (ref 11.5–15.5)
Smear Review: NORMAL
WBC: 8.8 K/uL (ref 4.0–10.5)
nRBC: 0 % (ref 0.0–0.2)

## 2024-06-16 LAB — BASIC METABOLIC PANEL WITH GFR
Anion gap: 10 (ref 5–15)
BUN: 15 mg/dL (ref 8–23)
CO2: 28 mmol/L (ref 22–32)
Calcium: 8.7 mg/dL — ABNORMAL LOW (ref 8.9–10.3)
Chloride: 98 mmol/L (ref 98–111)
Creatinine, Ser: 1.19 mg/dL (ref 0.61–1.24)
GFR, Estimated: >60 mL/min (ref >60)
Glucose, Bld: 141 mg/dL — ABNORMAL HIGH (ref 70–99)
Potassium: 4.1 mmol/L (ref 3.5–5.1)
Sodium: 136 mmol/L (ref 135–145)

## 2024-06-16 MED ORDER — HYDROCODONE-ACETAMINOPHEN 5-325 MG PO TABS: 1.0000 | ORAL_TABLET | Freq: Four times a day (QID) | ORAL | 0 refills | Status: DC | PRN

## 2024-06-16 MED ORDER — HYDROCODONE-ACETAMINOPHEN 5-325 MG PO TABS
1.0000 | ORAL_TABLET | Freq: Once | ORAL | Status: AC
  Administered 2024-06-16: 1 via ORAL
  Filled 2024-06-16: qty 1

## 2024-06-16 NOTE — ED Provider Notes (Signed)
 [place] EMERGENCY DEPARTMENT AT [facility] Provider Note   CSN: [id number] Arrival date & time: 06/16/24  2052     Patient presents with: Nicholas [name] is a 73 y.o. male.   73 year old male with prior medical history as detailed below presents for evaluation.  Patient resides in [facility].  Patient has a fused right knee.  Patient reports that as he was getting out of his bed he lost his balance secondary to the fused right knee.  He fell onto his left knee.  He then fell onto his backside.  He complains of low back pain which is chronic but somewhat worse tonight.  He denies head injury.  He is on Eliquis .  The history is provided by the patient and medical records.       Prior to Admission medications   Medication Sig Start Date End Date Taking? Authorizing Provider  acetaminophen  (TYLENOL ) 500 MG tablet Take 1,000 mg by mouth 3 (three) times daily.    [provider]  amitriptyline  (ELAVIL ) 50 MG tablet Take 0.5 tablets (25 mg total) by mouth in the morning and at bedtime. Patient not taking: Reported on 05/12/2024 03/08/23   Rilla Baller, MD  apixaban  (ELIQUIS ) 5 MG TABS tablet Take 1 tablet (5 mg total) by mouth 2 (two) times daily. 09/27/22   Rilla Baller, MD  atorvastatin  (LIPITOR) 20 MG tablet TAKE ONE TABLET BY MOUTH ONCE DAILY 02/21/22   Rilla Baller, MD  cetirizine  (ZYRTEC ) 10 MG tablet Take 1 tablet (10 mg total) by mouth daily. Patient not taking: Reported on 05/12/2024 07/25/22   Wendee Lynwood HERO, NP  Cholecalciferol  (VITAMIN D3) 25 MCG (1000 UT) CAPS Take 1 capsule (1,000 Units total) by mouth daily. 03/24/21   Rilla Baller, MD  cyanocobalamin  (VITAMIN B12) 1000 MCG tablet Take 1 tablet (1,000 mcg total) by mouth daily. 03/08/23   Rilla Baller, MD  divalproex  (DEPAKOTE  ER) 500 MG 24 hr tablet Take 500 mg by mouth daily.    [provider]  DULoxetine (CYMBALTA) 30 MG capsule Take 30 mg by mouth daily.     [provider]  EPINEPHrine  0.3 mg/0.3 mL IJ SOAJ injection Inject 0.3 mg into the muscle as needed for anaphylaxis. (bee stings) 12/18/21   Rilla Baller, MD  ferrous sulfate  325 (65 FE) MG EC tablet Take 325 mg by mouth daily with breakfast. Patient not taking: Reported on 05/12/2024    [provider]  furosemide  (LASIX ) 20 MG tablet Take 40 mg by mouth daily. 03/08/23   Rilla Baller, MD  gabapentin  (NEURONTIN ) 300 MG capsule Take 600 mg by mouth 3 (three) times daily. 300 mg in the am, 600 mg in the afternoon, and 600 mg in the evening 03/08/23   Rilla Baller, MD  gabapentin  (NEURONTIN ) 400 MG capsule Take 400 mg by mouth daily.    [provider]  levothyroxine  (SYNTHROID ) 125 MCG tablet Take 1 tablet (125 mcg total) by mouth daily before breakfast. 03/08/23   Rilla Baller, MD  Menthol , Topical Analgesic, (BIOFREEZE) 4 % GEL Apply 1 Application topically daily. Neck pain    [provider]  methylPREDNISolone  (MEDROL  DOSEPAK) 4 MG TBPK tablet Dose per box instructions Patient not taking: Reported on 05/12/2024 12/30/23   Silver Wonda LABOR, PA  metoprolol  succinate (TOPROL -XL) 100 MG 24 hr tablet Take 1 tablet (100 mg total) by mouth at bedtime. Take with or immediately following a meal. Patient taking differently: Take 50 mg by mouth at  bedtime. Take with or immediately following a meal. 08/28/22   Jens Durand, MD  midodrine (PROAMATINE) 2.5 MG tablet Take 2.5 mg by mouth as needed. 01/31/24   [provider]  nitroGLYCERIN  (NITROSTAT ) 0.4 MG SL tablet Place 1 tablet (0.4 mg total) under the tongue every 5 (five) minutes as needed for chest pain. 11/27/21   Rilla Baller, MD  omeprazole  (PRILOSEC) 40 MG capsule Take 1 capsule (40 mg total) by mouth daily. Patient taking differently: Take 40 mg by mouth every other day. 06/13/23   Avram Lupita BRAVO, MD  ondansetron  (ZOFRAN -ODT) 4 MG disintegrating tablet TAKE ONE TABLET BY MOUTH every  EIGHT hours AS NEEDED 10/04/22   Gayland Lauraine PARAS, NP  Probiotic Product (PROBIOTIC DAILY) CAPS Take 1 capsule by mouth. 1 capsule in the morning every 3 days    [provider]  Rimegepant Sulfate (NURTEC) 75 MG TBDP Take 1 tablet (75 mg total) by mouth as needed. Patient not taking: Reported on 05/12/2024 11/21/23   Gayland Lauraine PARAS, NP  tamsulosin  (FLOMAX ) 0.4 MG CAPS capsule TAKE ONE CAPSULE BY MOUTH ONCE DAILY 11/20/22   Rilla Baller, MD  tiZANidine  (ZANAFLEX ) 4 MG tablet TAKE ONE TABLET BY MOUTH every SIX hours AS NEEDED FOR acute HEADACHE 10/04/22   Gayland Lauraine PARAS, NP  Ubrogepant  (UBRELVY ) 100 MG TABS Take 100 mg by mouth as needed (take 1 tablet at onset of headache, may repeat in hours, max is 200 mg in 24 hours). 07/24/22   Gayland Lauraine PARAS, NP    Allergies: Peanut-containing drug products, Yellow jacket venom [bee venom], Celebrex [celecoxib], Percocet [oxycodone -acetaminophen ], Latex, and Nsaids    Review of Systems  All other systems reviewed and are negative.   Updated Vital Signs BP 102/70 (BP Location: Right Arm)   Pulse 77   Temp (!) 97.5 F (36.4 C) (Oral)   Resp 20   SpO2 98%   Physical Exam Vitals and nursing note reviewed.  Constitutional:      General: He is not in acute distress.    Appearance: Normal appearance. He is well-developed.  HENT:     Head: Normocephalic and atraumatic.  Eyes:     Conjunctiva/sclera: Conjunctivae normal.     Pupils: Pupils are equal, round, and reactive to light.  Cardiovascular:     Rate and Rhythm: Normal rate and regular rhythm.     Heart sounds: Normal heart sounds.  Pulmonary:     Effort: Pulmonary effort is normal. No respiratory distress.     Breath sounds: Normal breath sounds.  Abdominal:     General: There is no distension.     Palpations: Abdomen is soft.     Tenderness: There is no abdominal tenderness.  Musculoskeletal:        General: No deformity. Normal range of motion.     Cervical back: Normal range  of motion and neck supple.     Comments: Mild tenderness to the anterior aspect of the left knee.  Right knee is fused.  Left knee is status post total knee replacement.  Distal bilateral lower extremities are neurovascular tact.  Skin:    General: Skin is warm and dry.  Neurological:     General: No focal deficit present.     Mental Status: He is alert and oriented to person, place, and time.     (all labs ordered are listed, but only abnormal results are displayed) Labs Reviewed  CBC WITH DIFFERENTIAL/PLATELET - Abnormal; Notable for the following components:  Result Value   Monocytes Absolute 1.1 (*)    All other components within normal limits  BASIC METABOLIC PANEL WITH GFR - Abnormal; Notable for the following components:   Glucose, Bld 141 (*)    Calcium  8.7 (*)    All other components within normal limits    EKG: None  Radiology: DG Knee Complete 4 Views Right Result Date: 06/16/2024 CLINICAL DATA:  Pain EXAM: RIGHT KNEE - COMPLETE 4+ VIEW COMPARISON:  Right knee CT 04/01/2021. FINDINGS: Knee joint fusion changes are again noted with intramedullary nail traversing the tibia and fibula. There is minimal increased lucency measuring 1.8 mm surrounding the femoral component which appears unchanged from prior CT in 2022. There is no acute fracture or dislocation identified. Soft tissues are within normal limits. IMPRESSION: 1. No acute fracture or dislocation. 2. Knee joint fusion changes with stable minimal increased lucency surrounding the femoral component compared to 2022. Electronically Signed   By: Greig Pique M.D.   On: 06/16/2024 22:03   DG Pelvis Portable Result Date: 06/16/2024 CLINICAL DATA:  Fall EXAM: PORTABLE PELVIS 1-2 VIEWS COMPARISON:  CT abdomen and pelvis 10/07/2023. FINDINGS: There is no acute fracture or dislocation. Mild degenerative changes are seen involving both hips in the pubic symphysis. Right femoral intramedullary nail is partially visualized and  appears uncomplicated. IMPRESSION: 1. No acute fracture or dislocation. 2. Mild degenerative changes. Electronically Signed   By: Greig Pique M.D.   On: 06/16/2024 22:01   DG Chest Port 1 View Result Date: 06/16/2024 CLINICAL DATA:  Fall EXAM: PORTABLE CHEST 1 VIEW COMPARISON:  Chest x-ray 01/24/2024. FINDINGS: Lung volumes are low. There some patchy airspace opacities in both lower lungs new from prior. Cardiomediastinal silhouette is within normal limits for projection. There is no pleural effusion or pneumothorax. Left shoulder arthroplasty present. IMPRESSION: Low lung volumes with patchy airspace opacities in both lower lungs, new from prior. Findings may represent atelectasis or infection. Electronically Signed   By: Greig Pique M.D.   On: 06/16/2024 22:00   DG Knee Complete 4 Views Left Result Date: 06/16/2024 CLINICAL DATA:  Fall EXAM: LEFT KNEE - COMPLETE 4+ VIEW COMPARISON:  None Available. FINDINGS: Total knee arthroplasty is present without evidence for complication. No evidence of fracture, dislocation, or joint effusion. No evidence of arthropathy or other focal bone abnormality. Soft tissues are unremarkable. IMPRESSION: Total knee arthroplasty without evidence for complication. Electronically Signed   By: Greig Pique M.D.   On: 06/16/2024 21:59     Procedures   Medications Ordered in the ED  HYDROcodone -acetaminophen  (NORCO/VICODIN) 5-325 MG per tablet 1 tablet (1 tablet Oral Given 06/16/24 2143)                                    Medical Decision Making Patient with accidental fall.  He injured his left knee.  He complains of mild acute on chronic low back pain after the fall.  Patient feels much improved with treatment.  Imaging is not clearly demonstrative of acute pathology.  Patient is known to Dr. Barbarann, who plans on following up with him in the outpatient setting.  Patient desires discharge.  Importance of close follow-up stressed.  Strict return precautions given  and understood.  Amount and/or Complexity of Data Reviewed Labs: ordered. Radiology: ordered.  Risk Prescription drug management.        Final diagnoses:  Fall, initial encounter    ED  Discharge Orders          Ordered    HYDROcodone -acetaminophen  (NORCO/VICODIN) 5-325 MG tablet  Every 6 hours PRN        06/16/24 2303               Laurice Maude BROCKS, MD 06/16/24 2308

## 2024-06-16 NOTE — Discharge Instructions (Signed)
Return for any problems

## 2024-06-16 NOTE — ED Triage Notes (Addendum)
 Pt BIB [facility] from [facility] for fall, c/o back pain and bilateral leg pain, stated that he felt dizziness when getting up, pt tried to slide himself down the bed to ground and hit his knee. Denies hitting head, pt on blood thinners   114/66 HR 72 18 97% 3L O2 (baseline) 128 CBG

## 2024-06-17 DIAGNOSIS — R69 Illness, unspecified: Secondary | ICD-10-CM | POA: Diagnosis not present

## 2024-06-17 DIAGNOSIS — Z743 Need for continuous supervision: Secondary | ICD-10-CM | POA: Diagnosis not present

## 2024-06-17 DIAGNOSIS — I872 Venous insufficiency (chronic) (peripheral): Secondary | ICD-10-CM | POA: Diagnosis not present

## 2024-06-17 DIAGNOSIS — I5032 Chronic diastolic (congestive) heart failure: Secondary | ICD-10-CM | POA: Diagnosis not present

## 2024-06-17 DIAGNOSIS — Z7901 Long term (current) use of anticoagulants: Secondary | ICD-10-CM | POA: Diagnosis not present

## 2024-06-17 DIAGNOSIS — E039 Hypothyroidism, unspecified: Secondary | ICD-10-CM | POA: Diagnosis not present

## 2024-06-17 DIAGNOSIS — G8929 Other chronic pain: Secondary | ICD-10-CM | POA: Diagnosis not present

## 2024-06-17 DIAGNOSIS — I480 Paroxysmal atrial fibrillation: Secondary | ICD-10-CM | POA: Diagnosis not present

## 2024-06-17 DIAGNOSIS — J849 Interstitial pulmonary disease, unspecified: Secondary | ICD-10-CM | POA: Diagnosis not present

## 2024-06-17 DIAGNOSIS — Z7401 Bed confinement status: Secondary | ICD-10-CM | POA: Diagnosis not present

## 2024-06-17 DIAGNOSIS — M47816 Spondylosis without myelopathy or radiculopathy, lumbar region: Secondary | ICD-10-CM | POA: Diagnosis not present

## 2024-06-17 DIAGNOSIS — I714 Abdominal aortic aneurysm, without rupture, unspecified: Secondary | ICD-10-CM | POA: Diagnosis not present

## 2024-06-18 DIAGNOSIS — I739 Peripheral vascular disease, unspecified: Secondary | ICD-10-CM | POA: Diagnosis not present

## 2024-06-18 DIAGNOSIS — R2689 Other abnormalities of gait and mobility: Secondary | ICD-10-CM | POA: Diagnosis not present

## 2024-06-18 DIAGNOSIS — I5032 Chronic diastolic (congestive) heart failure: Secondary | ICD-10-CM | POA: Diagnosis not present

## 2024-06-19 DIAGNOSIS — I5032 Chronic diastolic (congestive) heart failure: Secondary | ICD-10-CM | POA: Diagnosis not present

## 2024-06-19 DIAGNOSIS — I480 Paroxysmal atrial fibrillation: Secondary | ICD-10-CM | POA: Diagnosis not present

## 2024-06-19 DIAGNOSIS — R262 Difficulty in walking, not elsewhere classified: Secondary | ICD-10-CM | POA: Diagnosis not present

## 2024-06-19 DIAGNOSIS — R9389 Abnormal findings on diagnostic imaging of other specified body structures: Secondary | ICD-10-CM | POA: Diagnosis not present

## 2024-06-19 DIAGNOSIS — I714 Abdominal aortic aneurysm, without rupture, unspecified: Secondary | ICD-10-CM | POA: Diagnosis not present

## 2024-06-19 DIAGNOSIS — E039 Hypothyroidism, unspecified: Secondary | ICD-10-CM | POA: Diagnosis not present

## 2024-06-19 DIAGNOSIS — J849 Interstitial pulmonary disease, unspecified: Secondary | ICD-10-CM | POA: Diagnosis not present

## 2024-06-19 DIAGNOSIS — Z7901 Long term (current) use of anticoagulants: Secondary | ICD-10-CM | POA: Diagnosis not present

## 2024-06-19 DIAGNOSIS — Z9181 History of falling: Secondary | ICD-10-CM | POA: Diagnosis not present

## 2024-06-19 DIAGNOSIS — R0789 Other chest pain: Secondary | ICD-10-CM | POA: Diagnosis not present

## 2024-06-22 DIAGNOSIS — I10 Essential (primary) hypertension: Secondary | ICD-10-CM | POA: Diagnosis not present

## 2024-06-22 DIAGNOSIS — D519 Vitamin B12 deficiency anemia, unspecified: Secondary | ICD-10-CM | POA: Diagnosis not present

## 2024-06-22 DIAGNOSIS — E1165 Type 2 diabetes mellitus with hyperglycemia: Secondary | ICD-10-CM | POA: Diagnosis not present

## 2024-06-22 DIAGNOSIS — R945 Abnormal results of liver function studies: Secondary | ICD-10-CM | POA: Diagnosis not present

## 2024-06-22 DIAGNOSIS — R300 Dysuria: Secondary | ICD-10-CM | POA: Diagnosis not present

## 2024-06-22 DIAGNOSIS — E559 Vitamin D deficiency, unspecified: Secondary | ICD-10-CM | POA: Diagnosis not present

## 2024-06-22 DIAGNOSIS — D500 Iron deficiency anemia secondary to blood loss (chronic): Secondary | ICD-10-CM | POA: Diagnosis not present

## 2024-06-22 DIAGNOSIS — I5020 Unspecified systolic (congestive) heart failure: Secondary | ICD-10-CM | POA: Diagnosis not present

## 2024-06-22 DIAGNOSIS — E785 Hyperlipidemia, unspecified: Secondary | ICD-10-CM | POA: Diagnosis not present

## 2024-06-23 DIAGNOSIS — R0789 Other chest pain: Secondary | ICD-10-CM | POA: Diagnosis not present

## 2024-06-23 DIAGNOSIS — I480 Paroxysmal atrial fibrillation: Secondary | ICD-10-CM | POA: Diagnosis not present

## 2024-06-23 DIAGNOSIS — Z7901 Long term (current) use of anticoagulants: Secondary | ICD-10-CM | POA: Diagnosis not present

## 2024-07-07 DIAGNOSIS — R059 Cough, unspecified: Secondary | ICD-10-CM | POA: Diagnosis not present

## 2024-07-08 DIAGNOSIS — J849 Interstitial pulmonary disease, unspecified: Secondary | ICD-10-CM | POA: Diagnosis not present

## 2024-07-08 DIAGNOSIS — Z9981 Dependence on supplemental oxygen: Secondary | ICD-10-CM | POA: Diagnosis not present

## 2024-07-13 ENCOUNTER — Telehealth: Payer: Self-pay

## 2024-07-13 NOTE — Telephone Encounter (Signed)
 Does the patient have:  Hypertension: no Diabetes: no Contrast allergy: no Recent labs: 06/16/2024   BMP, CBC Any medical devices (spinal stimulator, bladder stimulator, pain pump, pacemaker)? no History of Ovarian or Colon cancer or Lymphoma: no Abdominal Surgery in the past 6 weeks: no

## 2024-07-13 NOTE — Progress Notes (Signed)
 Electrophysiology Office Note:   Date:  07/14/2024  ID:  Tameem, Pullara [birth year], MRN [id number]  Primary Cardiologist: Lonni Hanson, MD Electrophysiologist: Fonda Kitty, MD      History of Present Illness:   Nicholas [name] is a 73 y.o. male with h/o stroke, atrial fibrillation on chronic apixaban , gastric ulcers, obstructive sleep apnea who is being seen today for AF management.  Discussed the use of AI scribe software for clinical note transcription with the patient, who gave verbal consent to proceed.  History of Present Illness Nicholas [name] is a 73 year old male with atrial fibrillation who presents for evaluation of his heart rhythm.  He has been experiencing atrial fibrillation for nearly a year, with a notable change in his condition compared to when he was in normal rhythm. He describes a sensation of a 'constant battle' in his chest, particularly in the mornings, which makes it difficult for him to catch his breath. He also experiences sensations of his heart 'flip flopping' or 'turning flips' in his chest.  His atrial fibrillation has been documented on EKGs performed in April, July, and today. The last EKG showing normal rhythm was on July 31, 2023.  He has a history of sleep apnea and an old pneumonia in the upper lobe. He currently resides in a nursing facility due to a history of falls. He reports experiencing leg swelling at times.    Review of systems complete and found to be negative unless listed in HPI.   EP Information / Studies Reviewed:    EKG is ordered today. Personal review as below.  EKG Interpretation Date/Time:  Tuesday July 14 2024 10:20:52 EDT Ventricular Rate:  88 PR Interval:    QRS Duration:  84 QT Interval:  350 QTC Calculation: 423 R Axis:   -31  Text Interpretation: Atrial fibrillation with premature ventricular or aberrantly conducted complexes Left axis deviation Low voltage QRS Possible Anterolateral infarct , age  undetermined When compared with ECG of 12-May-2024 13:33, Borderline criteria for Anterolateral infarct are now Present Nonspecific T wave abnormality, worse in Lateral leads Confirmed by Kitty Fonda [phone]) on 07/14/2024 10:33:59 AM   EKG 05/12/24: AF   Echo 03/24/24:   1. Left ventricular ejection fraction, by estimation, is 55 to 60%. Left  ventricular ejection fraction by 2D MOD biplane is 58.9 %. The left  ventricle has normal function. The left ventricle has no regional wall  motion abnormalities. Left ventricular  diastolic parameters are indeterminate.   2. Right ventricular systolic function is normal. The right ventricular  size is moderately enlarged.   3. Left atrial size was mildly dilated.   4. The mitral valve is normal in structure. Mild mitral valve  regurgitation.   5. The aortic valve was not well visualized. Aortic valve regurgitation  is mild.   6. Aortic dilatation noted. There is mild dilatation of the aortic root,  measuring 43 mm. There is mild dilatation of the ascending aorta,  measuring 42 mm.   Coronary CTA 08/08/23:  IMPRESSION: 1. Coronary calcium  score of 13.3. This was 18th percentile for age and sex matched control.   2. Normal coronary origin with right dominance.   3. Calcified plaque in proximal LAD causing minimal stenosis (<25%).   4. CAD-RADS 1. Consider non-atherosclerotic causes of chest pain. Consider preventive therapy and risk factor modification.   5. Mildly dilated ascending aorta, measuring 41 mm in diameter.  LHC 03/2024:  Normal coronary arteries without any significant coronary artery  disease Normal LVEDP Normal LVEF  Risk Assessment/Calculations:    CHA2DS2-VASc Score = 6   This indicates a 9.7% annual risk of stroke. The patient's score is based upon: CHF History: 1 HTN History: 0 Diabetes History: 1 Stroke History: 2 Vascular Disease History: 1 Age Score: 1 Gender Score: 0             Physical Exam:   VS:   BP 100/60 (BP Location: Right Arm, Patient Position: Sitting, Cuff Size: Large)   Pulse 88   Ht 6' (1.829 m)   Wt 256 lb (116.1 kg)   SpO2 92%   BMI 34.72 kg/m    Wt Readings from Last 3 Encounters:  07/14/24 256 lb (116.1 kg)  05/12/24 263 lb 9.6 oz (119.6 kg)  02/05/24 268 lb (121.6 kg)     GEN: Well nourished, well developed in no acute distress NECK: No JVD CARDIAC: Normal rate, irregular RESPIRATORY:  Wheezing noted  ABDOMEN: Soft, non-distended EXTREMITIES: 1+ edema; No deformity   ASSESSMENT AND PLAN:    #Paroxysmal atrial fibrillation: Symptomatic.  #Secondary hypercoagulable state due to AF: - Start flecainide 100mg  BID. Normal LVEF on prior echos. Normal LHC in 2019, minimal non-obstructive CAD in 2024. Would like to avoid amiodarone due to lung disease, baseline O2 requirements. Cannot take Tikosyn due to interactions with multiple medications.  - Cardioversion in 10 days. ECG for flecainide monitoring to be done at that time.  - Continue Eliquis  5mg  BID. Denies bleeding issues.   #OSA: - Encouraged CPAP.   Follow up with EP APP in 8 weeks.   Signed, Fonda Kitty, MD

## 2024-07-13 NOTE — Telephone Encounter (Signed)
-----   Message from [place] [facility] sent at 07/13/2024  1:48 PM EDT ----- Regarding: CT Angio Hi can you please send over the answers to questions so I can schedule CT Angio Chest Thanks!

## 2024-07-14 ENCOUNTER — Encounter: Payer: Self-pay | Admitting: Cardiology

## 2024-07-14 ENCOUNTER — Ambulatory Visit: Attending: Cardiology | Admitting: Cardiology

## 2024-07-14 VITALS — BP 100/60 | HR 88 | Ht 72.0 in | Wt 256.0 lb

## 2024-07-14 DIAGNOSIS — G4733 Obstructive sleep apnea (adult) (pediatric): Secondary | ICD-10-CM | POA: Diagnosis not present

## 2024-07-14 DIAGNOSIS — I480 Paroxysmal atrial fibrillation: Secondary | ICD-10-CM | POA: Diagnosis not present

## 2024-07-14 DIAGNOSIS — D6869 Other thrombophilia: Secondary | ICD-10-CM | POA: Diagnosis not present

## 2024-07-14 MED ORDER — FLECAINIDE ACETATE 100 MG PO TABS: 100.0000 mg | ORAL_TABLET | Freq: Two times a day (BID) | ORAL | 3 refills | Status: DC

## 2024-07-14 NOTE — Patient Instructions (Signed)
 Medication Instructions:  Your physician has recommended you make the following change in your medication:  1) START taking flecainide 100 mg twice daily   *If you need a refill on your cardiac medications before your next appointment, please call your pharmacy*  Lab Work: TODAY: BMET and CBC  Testing/Procedures: Cardioversion  Your physician has recommended that you have a Cardioversion (DCCV). Electrical Cardioversion uses a jolt of electricity to your heart either through paddles or wired patches attached to your chest. This is a controlled, usually prescheduled, procedure. Defibrillation is done under light anesthesia in the hospital, and you usually go home the day of the procedure. This is done to get your heart back into a normal rhythm. You are not awake for the procedure. Please see the instruction sheet given to you today.  Follow-Up: At [facility], you and your health needs are our priority.  As part of our continuing mission to provide you with exceptional heart care, our providers are all part of one team.  This team includes your primary Cardiologist (physician) and Advanced Practice Providers or APPs (Physician Assistants and Nurse Practitioners) who all work together to provide you with the care you need, when you need it.  Your next appointment:   2 months  Provider:   Fonda Kitty, MD

## 2024-07-15 LAB — CBC
Hematocrit: 43.1 % (ref 37.5–51.0)
Hemoglobin: 14 g/dL (ref 13.0–17.7)
MCH: 30.7 pg (ref 26.6–33.0)
MCHC: 32.5 g/dL (ref 31.5–35.7)
MCV: 95 fL (ref 79–97)
Platelets: 261 x10E3/uL (ref 150–450)
RBC: 4.56 x10E6/uL (ref 4.14–5.80)
RDW: 12.7 % (ref 11.6–15.4)
WBC: 12.6 x10E3/uL — ABNORMAL HIGH (ref 3.4–10.8)

## 2024-07-15 LAB — BASIC METABOLIC PANEL WITH GFR
BUN/Creatinine Ratio: 19 (ref 10–24)
BUN: 22 mg/dL (ref 8–27)
CO2: 24 mmol/L (ref 20–29)
Calcium: 9.1 mg/dL (ref 8.6–10.2)
Chloride: 99 mmol/L (ref 96–106)
Creatinine, Ser: 1.15 mg/dL (ref 0.76–1.27)
Glucose: 124 mg/dL — ABNORMAL HIGH (ref 70–99)
Potassium: 4.1 mmol/L (ref 3.5–5.2)
Sodium: 140 mmol/L (ref 134–144)
eGFR: 67 mL/min/1.73 (ref >59)

## 2024-07-19 ENCOUNTER — Other Ambulatory Visit: Payer: Self-pay

## 2024-07-19 ENCOUNTER — Encounter (HOSPITAL_COMMUNITY): Payer: Self-pay

## 2024-07-19 ENCOUNTER — Inpatient Hospital Stay (HOSPITAL_COMMUNITY)

## 2024-07-19 ENCOUNTER — Ambulatory Visit: Payer: Self-pay | Admitting: Cardiology

## 2024-07-19 ENCOUNTER — Emergency Department (HOSPITAL_COMMUNITY)

## 2024-07-19 ENCOUNTER — Inpatient Hospital Stay (HOSPITAL_COMMUNITY)
Admission: EM | Admit: 2024-07-19 | Discharge: 2024-08-15 | DRG: 871 | Disposition: E | Source: Skilled Nursing Facility | Attending: Internal Medicine | Admitting: Internal Medicine

## 2024-07-19 DIAGNOSIS — I480 Paroxysmal atrial fibrillation: Secondary | ICD-10-CM | POA: Diagnosis present

## 2024-07-19 DIAGNOSIS — Z8249 Family history of ischemic heart disease and other diseases of the circulatory system: Secondary | ICD-10-CM

## 2024-07-19 DIAGNOSIS — Z91040 Latex allergy status: Secondary | ICD-10-CM

## 2024-07-19 DIAGNOSIS — Z1152 Encounter for screening for COVID-19: Secondary | ICD-10-CM | POA: Diagnosis not present

## 2024-07-19 DIAGNOSIS — E876 Hypokalemia: Secondary | ICD-10-CM | POA: Diagnosis not present

## 2024-07-19 DIAGNOSIS — E039 Hypothyroidism, unspecified: Secondary | ICD-10-CM | POA: Diagnosis not present

## 2024-07-19 DIAGNOSIS — Z789 Other specified health status: Secondary | ICD-10-CM | POA: Diagnosis not present

## 2024-07-19 DIAGNOSIS — F32A Depression, unspecified: Secondary | ICD-10-CM | POA: Diagnosis present

## 2024-07-19 DIAGNOSIS — Z96652 Presence of left artificial knee joint: Secondary | ICD-10-CM | POA: Diagnosis present

## 2024-07-19 DIAGNOSIS — Z66 Do not resuscitate: Secondary | ICD-10-CM | POA: Diagnosis not present

## 2024-07-19 DIAGNOSIS — F419 Anxiety disorder, unspecified: Secondary | ICD-10-CM | POA: Diagnosis present

## 2024-07-19 DIAGNOSIS — Z79899 Other long term (current) drug therapy: Secondary | ICD-10-CM

## 2024-07-19 DIAGNOSIS — Z91199 Patient's noncompliance with other medical treatment and regimen due to unspecified reason: Secondary | ICD-10-CM

## 2024-07-19 DIAGNOSIS — J9621 Acute and chronic respiratory failure with hypoxia: Secondary | ICD-10-CM | POA: Diagnosis present

## 2024-07-19 DIAGNOSIS — Z79890 Hormone replacement therapy (postmenopausal): Secondary | ICD-10-CM | POA: Diagnosis not present

## 2024-07-19 DIAGNOSIS — Z888 Allergy status to other drugs, medicaments and biological substances status: Secondary | ICD-10-CM

## 2024-07-19 DIAGNOSIS — Z823 Family history of stroke: Secondary | ICD-10-CM

## 2024-07-19 DIAGNOSIS — Z860101 Personal history of adenomatous and serrated colon polyps: Secondary | ICD-10-CM

## 2024-07-19 DIAGNOSIS — J189 Pneumonia, unspecified organism: Secondary | ICD-10-CM | POA: Diagnosis present

## 2024-07-19 DIAGNOSIS — R6520 Severe sepsis without septic shock: Secondary | ICD-10-CM | POA: Diagnosis present

## 2024-07-19 DIAGNOSIS — R4589 Other symptoms and signs involving emotional state: Secondary | ICD-10-CM | POA: Diagnosis not present

## 2024-07-19 DIAGNOSIS — Z515 Encounter for palliative care: Secondary | ICD-10-CM | POA: Diagnosis not present

## 2024-07-19 DIAGNOSIS — K219 Gastro-esophageal reflux disease without esophagitis: Secondary | ICD-10-CM | POA: Diagnosis present

## 2024-07-19 DIAGNOSIS — A419 Sepsis, unspecified organism: Secondary | ICD-10-CM | POA: Diagnosis not present

## 2024-07-19 DIAGNOSIS — I4891 Unspecified atrial fibrillation: Secondary | ICD-10-CM | POA: Diagnosis not present

## 2024-07-19 DIAGNOSIS — R042 Hemoptysis: Secondary | ICD-10-CM | POA: Diagnosis present

## 2024-07-19 DIAGNOSIS — J45909 Unspecified asthma, uncomplicated: Secondary | ICD-10-CM | POA: Diagnosis not present

## 2024-07-19 DIAGNOSIS — Z9981 Dependence on supplemental oxygen: Secondary | ICD-10-CM

## 2024-07-19 DIAGNOSIS — Z803 Family history of malignant neoplasm of breast: Secondary | ICD-10-CM

## 2024-07-19 DIAGNOSIS — Z91030 Bee allergy status: Secondary | ICD-10-CM | POA: Diagnosis not present

## 2024-07-19 DIAGNOSIS — Z96612 Presence of left artificial shoulder joint: Secondary | ICD-10-CM | POA: Diagnosis not present

## 2024-07-19 DIAGNOSIS — Z8711 Personal history of peptic ulcer disease: Secondary | ICD-10-CM

## 2024-07-19 DIAGNOSIS — J849 Interstitial pulmonary disease, unspecified: Secondary | ICD-10-CM

## 2024-07-19 DIAGNOSIS — Z8701 Personal history of pneumonia (recurrent): Secondary | ICD-10-CM

## 2024-07-19 DIAGNOSIS — R918 Other nonspecific abnormal finding of lung field: Secondary | ICD-10-CM | POA: Diagnosis not present

## 2024-07-19 DIAGNOSIS — E66811 Obesity, class 1: Secondary | ICD-10-CM | POA: Diagnosis present

## 2024-07-19 DIAGNOSIS — Z886 Allergy status to analgesic agent status: Secondary | ICD-10-CM | POA: Diagnosis not present

## 2024-07-19 DIAGNOSIS — Z8616 Personal history of COVID-19: Secondary | ICD-10-CM

## 2024-07-19 DIAGNOSIS — Z833 Family history of diabetes mellitus: Secondary | ICD-10-CM | POA: Diagnosis not present

## 2024-07-19 DIAGNOSIS — R609 Edema, unspecified: Secondary | ICD-10-CM | POA: Diagnosis not present

## 2024-07-19 DIAGNOSIS — Z7189 Other specified counseling: Secondary | ICD-10-CM | POA: Diagnosis not present

## 2024-07-19 DIAGNOSIS — G4733 Obstructive sleep apnea (adult) (pediatric): Secondary | ICD-10-CM | POA: Diagnosis present

## 2024-07-19 DIAGNOSIS — I252 Old myocardial infarction: Secondary | ICD-10-CM

## 2024-07-19 DIAGNOSIS — Z96611 Presence of right artificial shoulder joint: Secondary | ICD-10-CM | POA: Diagnosis present

## 2024-07-19 DIAGNOSIS — J9601 Acute respiratory failure with hypoxia: Secondary | ICD-10-CM | POA: Diagnosis not present

## 2024-07-19 DIAGNOSIS — Z8661 Personal history of infections of the central nervous system: Secondary | ICD-10-CM

## 2024-07-19 DIAGNOSIS — I2510 Atherosclerotic heart disease of native coronary artery without angina pectoris: Secondary | ICD-10-CM | POA: Diagnosis not present

## 2024-07-19 DIAGNOSIS — Z8673 Personal history of transient ischemic attack (TIA), and cerebral infarction without residual deficits: Secondary | ICD-10-CM

## 2024-07-19 DIAGNOSIS — G43909 Migraine, unspecified, not intractable, without status migrainosus: Secondary | ICD-10-CM | POA: Diagnosis present

## 2024-07-19 DIAGNOSIS — J9859 Other diseases of mediastinum, not elsewhere classified: Secondary | ICD-10-CM | POA: Diagnosis not present

## 2024-07-19 DIAGNOSIS — Z91010 Allergy to peanuts: Secondary | ICD-10-CM

## 2024-07-19 DIAGNOSIS — Z7901 Long term (current) use of anticoagulants: Secondary | ICD-10-CM | POA: Diagnosis not present

## 2024-07-19 DIAGNOSIS — R0989 Other specified symptoms and signs involving the circulatory and respiratory systems: Secondary | ICD-10-CM | POA: Diagnosis not present

## 2024-07-19 DIAGNOSIS — Z6834 Body mass index (BMI) 34.0-34.9, adult: Secondary | ICD-10-CM

## 2024-07-19 DIAGNOSIS — K760 Fatty (change of) liver, not elsewhere classified: Secondary | ICD-10-CM | POA: Diagnosis not present

## 2024-07-19 DIAGNOSIS — R739 Hyperglycemia, unspecified: Secondary | ICD-10-CM | POA: Diagnosis not present

## 2024-07-19 DIAGNOSIS — J984 Other disorders of lung: Secondary | ICD-10-CM | POA: Diagnosis not present

## 2024-07-19 DIAGNOSIS — Z885 Allergy status to narcotic agent status: Secondary | ICD-10-CM

## 2024-07-19 DIAGNOSIS — Z89412 Acquired absence of left great toe: Secondary | ICD-10-CM

## 2024-07-19 DIAGNOSIS — J90 Pleural effusion, not elsewhere classified: Secondary | ICD-10-CM | POA: Diagnosis not present

## 2024-07-19 LAB — I-STAT VENOUS BLOOD GAS, ED
Acid-Base Excess: 6 mmol/L — ABNORMAL HIGH (ref 0.0–2.0)
Bicarbonate: 32.2 mmol/L — ABNORMAL HIGH (ref 20.0–28.0)
Calcium, Ion: 1.11 mmol/L — ABNORMAL LOW (ref 1.15–1.40)
HCT: 39 % (ref 39.0–52.0)
Hemoglobin: 13.3 g/dL (ref 13.0–17.0)
O2 Saturation: 71 %
Potassium: 3.8 mmol/L (ref 3.5–5.1)
Sodium: 140 mmol/L (ref 135–145)
TCO2: 34 mmol/L — ABNORMAL HIGH (ref 22–32)
pCO2, Ven: 54.6 mmHg (ref 44–60)
pH, Ven: 7.379 (ref 7.25–7.43)
pO2, Ven: 39 mmHg (ref 32–45)

## 2024-07-19 LAB — I-STAT CHEM 8, ED
BUN: 22 mg/dL (ref 8–23)
Calcium, Ion: 1.11 mmol/L — ABNORMAL LOW (ref 1.15–1.40)
Chloride: 99 mmol/L (ref 98–111)
Creatinine, Ser: 1.2 mg/dL (ref 0.61–1.24)
Glucose, Bld: 135 mg/dL — ABNORMAL HIGH (ref 70–99)
HCT: 39 % (ref 39.0–52.0)
Hemoglobin: 13.3 g/dL (ref 13.0–17.0)
Potassium: 3.8 mmol/L (ref 3.5–5.1)
Sodium: 140 mmol/L (ref 135–145)
TCO2: 30 mmol/L (ref 22–32)

## 2024-07-19 LAB — RESPIRATORY PANEL BY PCR

## 2024-07-19 LAB — SARS CORONAVIRUS 2 BY RT PCR: SARS Coronavirus 2 by RT PCR: NEGATIVE

## 2024-07-19 LAB — CBC WITH DIFFERENTIAL/PLATELET
Abs Immature Granulocytes: 0.07 K/uL (ref 0.00–0.07)
Basophils Absolute: 0.1 K/uL (ref 0.0–0.1)
Basophils Relative: 0 %
Eosinophils Absolute: 0.2 K/uL (ref 0.0–0.5)
Eosinophils Relative: 1 %
HCT: 40.7 % (ref 39.0–52.0)
Hemoglobin: 12.9 g/dL — ABNORMAL LOW (ref 13.0–17.0)
Immature Granulocytes: 1 %
Lymphocytes Relative: 8 %
Lymphs Abs: 1.1 K/uL (ref 0.7–4.0)
MCH: 30.2 pg (ref 26.0–34.0)
MCHC: 31.7 g/dL (ref 30.0–36.0)
MCV: 95.3 fL (ref 80.0–100.0)
Monocytes Absolute: 2.1 K/uL — ABNORMAL HIGH (ref 0.1–1.0)
Monocytes Relative: 15 %
Neutro Abs: 10.3 K/uL — ABNORMAL HIGH (ref 1.7–7.7)
Neutrophils Relative %: 75 %
Platelets: 216 K/uL (ref 150–400)
RBC: 4.27 MIL/uL (ref 4.22–5.81)
RDW: 13.8 % (ref 11.5–15.5)
WBC: 13.8 K/uL — ABNORMAL HIGH (ref 4.0–10.5)
nRBC: 0 % (ref 0.0–0.2)

## 2024-07-19 LAB — COMPREHENSIVE METABOLIC PANEL WITH GFR
ALT: 10 U/L (ref 0–44)
AST: 25 U/L (ref 15–41)
Albumin: 3.3 g/dL — ABNORMAL LOW (ref 3.5–5.0)
Alkaline Phosphatase: 69 U/L (ref 38–126)
Anion gap: 15 (ref 5–15)
BUN: 20 mg/dL (ref 8–23)
CO2: 28 mmol/L (ref 22–32)
Calcium: 9.1 mg/dL (ref 8.9–10.3)
Chloride: 97 mmol/L — ABNORMAL LOW (ref 98–111)
Creatinine, Ser: 1.25 mg/dL — ABNORMAL HIGH (ref 0.61–1.24)
GFR, Estimated: >60 mL/min (ref >60)
Glucose, Bld: 133 mg/dL — ABNORMAL HIGH (ref 70–99)
Potassium: 3.8 mmol/L (ref 3.5–5.1)
Sodium: 140 mmol/L (ref 135–145)
Total Bilirubin: 3.7 mg/dL — ABNORMAL HIGH (ref 0.0–1.2)
Total Protein: 7.8 g/dL (ref 6.5–8.1)

## 2024-07-19 LAB — GLUCOSE, CAPILLARY: Glucose-Capillary: 113 mg/dL — ABNORMAL HIGH (ref 70–99)

## 2024-07-19 LAB — I-STAT CG4 LACTIC ACID, ED
Lactic Acid, Venous: 3 mmol/L (ref 0.5–1.9)
Lactic Acid, Venous: 2.5 mmol/L (ref 0.5–1.9)

## 2024-07-19 LAB — PROCALCITONIN: Procalcitonin: 0.2 ng/mL

## 2024-07-19 LAB — BRAIN NATRIURETIC PEPTIDE: B Natriuretic Peptide: 315.2 pg/mL — ABNORMAL HIGH (ref 0.0–100.0)

## 2024-07-19 LAB — PROTIME-INR
INR: 1.6 — ABNORMAL HIGH (ref 0.8–1.2)
Prothrombin Time: 20 s — ABNORMAL HIGH (ref 11.4–15.2)

## 2024-07-19 LAB — TROPONIN I (HIGH SENSITIVITY): Troponin I (High Sensitivity): 10 ng/L (ref <18)

## 2024-07-19 MED ORDER — BUDESONIDE 0.5 MG/2ML IN SUSP
0.5000 mg | Freq: Two times a day (BID) | RESPIRATORY_TRACT | Status: DC
Start: 2024-07-19 — End: 2024-07-24
  Administered 2024-07-19 – 2024-07-23 (×8): 0.5 mg via RESPIRATORY_TRACT
  Filled 2024-07-19 (×9): qty 2

## 2024-07-19 MED ORDER — SODIUM CHLORIDE 0.9 % IV SOLN
1.0000 g | Freq: Once | INTRAVENOUS | Status: AC
  Administered 2024-07-19: 1 g via INTRAVENOUS
  Filled 2024-07-19: qty 10

## 2024-07-19 MED ORDER — IOHEXOL 350 MG/ML SOLN
100.0000 mL | Freq: Once | INTRAVENOUS | Status: AC | PRN
Start: 2024-07-19 — End: 2024-07-19
  Administered 2024-07-19: 100 mL via INTRAVENOUS

## 2024-07-19 MED ORDER — POLYETHYLENE GLYCOL 3350 17 G PO PACK: 17.0000 g | PACK | Freq: Every day | ORAL | Status: DC | PRN

## 2024-07-19 MED ORDER — DIVALPROEX SODIUM ER 500 MG PO TB24
500.0000 mg | ORAL_TABLET | Freq: Every day | ORAL | Status: DC
Start: 2024-07-19 — End: 2024-07-20
  Filled 2024-07-19: qty 1

## 2024-07-19 MED ORDER — MORPHINE SULFATE (PF) 2 MG/ML IV SOLN
2.0000 mg | Freq: Once | INTRAVENOUS | Status: AC
  Administered 2024-07-19: 2 mg via INTRAVENOUS
  Filled 2024-07-19: qty 1

## 2024-07-19 MED ORDER — ORAL CARE MOUTH RINSE
15.0000 mL | OROMUCOSAL | Status: DC
  Administered 2024-07-20 (×3): 15 mL via OROMUCOSAL

## 2024-07-19 MED ORDER — PIPERACILLIN-TAZOBACTAM 3.375 G IVPB
3.3750 g | Freq: Three times a day (TID) | INTRAVENOUS | Status: DC
Start: 2024-07-19 — End: 2024-07-24
  Administered 2024-07-19 – 2024-07-23 (×11): 3.375 g via INTRAVENOUS
  Filled 2024-07-19 (×11): qty 50

## 2024-07-19 MED ORDER — ORAL CARE MOUTH RINSE: 15.0000 mL | OROMUCOSAL | Status: DC | PRN

## 2024-07-19 MED ORDER — FLECAINIDE ACETATE 100 MG PO TABS
100.0000 mg | ORAL_TABLET | Freq: Two times a day (BID) | ORAL | Status: DC
  Administered 2024-07-19 – 2024-07-21 (×2): 100 mg via ORAL
  Filled 2024-07-19 (×8): qty 1

## 2024-07-19 MED ORDER — METHYLPREDNISOLONE SODIUM SUCC 500 MG IJ SOLR
500.0000 mg | INTRAMUSCULAR | Status: DC
  Administered 2024-07-19 – 2024-07-20 (×2): 500 mg via INTRAVENOUS
  Filled 2024-07-19 (×3): qty 500

## 2024-07-19 MED ORDER — ARFORMOTEROL TARTRATE 15 MCG/2ML IN NEBU
15.0000 ug | INHALATION_SOLUTION | Freq: Two times a day (BID) | RESPIRATORY_TRACT | Status: DC
  Administered 2024-07-19 – 2024-07-23 (×8): 15 ug via RESPIRATORY_TRACT
  Filled 2024-07-19 (×9): qty 2

## 2024-07-19 MED ORDER — CHLORHEXIDINE GLUCONATE CLOTH 2 % EX PADS
6.0000 | MEDICATED_PAD | Freq: Every day | CUTANEOUS | Status: DC
  Administered 2024-07-19 – 2024-07-22 (×5): 6 via TOPICAL

## 2024-07-19 MED ORDER — SODIUM CHLORIDE 0.9 % IV SOLN
500.0000 mg | INTRAVENOUS | Status: DC
  Administered 2024-07-20 – 2024-07-22 (×3): 500 mg via INTRAVENOUS
  Filled 2024-07-19 (×4): qty 5

## 2024-07-19 MED ORDER — VANCOMYCIN HCL 1500 MG/300ML IV SOLN
1500.0000 mg | INTRAVENOUS | Status: DC
  Administered 2024-07-20: 1500 mg via INTRAVENOUS
  Filled 2024-07-19: qty 300

## 2024-07-19 MED ORDER — IPRATROPIUM-ALBUTEROL 0.5-2.5 (3) MG/3ML IN SOLN
3.0000 mL | RESPIRATORY_TRACT | Status: DC | PRN
  Administered 2024-07-19 – 2024-07-20 (×2): 3 mL via RESPIRATORY_TRACT
  Filled 2024-07-19: qty 6
  Filled 2024-07-19: qty 3

## 2024-07-19 MED ORDER — VANCOMYCIN HCL 1750 MG/350ML IV SOLN
1750.0000 mg | Freq: Once | INTRAVENOUS | Status: AC
  Administered 2024-07-19: 1750 mg via INTRAVENOUS
  Filled 2024-07-19: qty 350

## 2024-07-19 MED ORDER — SODIUM CHLORIDE 0.9 % IV SOLN: 500.0000 mg | INTRAVENOUS | Status: DC

## 2024-07-19 MED ORDER — PIPERACILLIN-TAZOBACTAM 3.375 G IVPB 30 MIN
3.3750 g | Freq: Once | INTRAVENOUS | Status: AC
  Administered 2024-07-19: 3.375 g via INTRAVENOUS
  Filled 2024-07-19: qty 50

## 2024-07-19 MED ORDER — DOCUSATE SODIUM 100 MG PO CAPS: 100.0000 mg | ORAL_CAPSULE | Freq: Two times a day (BID) | ORAL | Status: DC | PRN

## 2024-07-19 MED ORDER — SODIUM CHLORIDE 0.9 % IV SOLN
500.0000 mg | Freq: Once | INTRAVENOUS | Status: AC
  Administered 2024-07-19: 500 mg via INTRAVENOUS
  Filled 2024-07-19: qty 5

## 2024-07-19 MED ORDER — APIXABAN 5 MG PO TABS
5.0000 mg | ORAL_TABLET | Freq: Two times a day (BID) | ORAL | Status: DC
Start: 2024-07-20 — End: 2024-07-21
  Filled 2024-07-19 (×2): qty 1

## 2024-07-19 MED ORDER — FUROSEMIDE 10 MG/ML IJ SOLN
40.0000 mg | Freq: Once | INTRAMUSCULAR | Status: AC
Start: 2024-07-19 — End: 2024-07-19
  Administered 2024-07-19: 40 mg via INTRAVENOUS
  Filled 2024-07-19: qty 4

## 2024-07-19 NOTE — Progress Notes (Signed)
 VASCULAR LAB    Left lower extremity venous duplex has been performed.  See CV proc for preliminary results.   Treazure Nery, RVT 07/19/2024, 2:15 PM

## 2024-07-19 NOTE — Progress Notes (Signed)
 Patient with interstitial lung disease  Tachypneic On BiPAP Acute on chronic respiratory failure  Will start him on high-dose steroids  Possibly has ILD flare  Solu-Medrol  500 daily for 3 days  Receiving bronchodilator treatments Risk of decompensation remains very high

## 2024-07-19 NOTE — ED Provider Notes (Signed)
  Physical Exam  BP 123/64   Pulse 79   Temp 98.9 F (37.2 C) (Axillary)   Resp (!) 42   Ht 1.829 m (6')   Wt 116.6 kg   SpO2 96%   BMI 34.86 kg/m   Physical Exam  Procedures  Procedures  ED Course / MDM   Clinical Course as of 07/19/24 0802  Sun Jul 19, 2024  0628 Questions circumscribed cavitary lesion in the right upper lobe.  Will need CTA of the chest for further delineation. [KH]    Clinical Course User Index [KH] Keith Sor, PA-C   Medical Decision Making Amount and/or Complexity of Data Reviewed Radiology: ordered.   73 yo male signed out to me from previous team.  Patient with ho of pneumonia and has been on doxycycline .  He presented last night with increased dyspnea, work of breathing and generally worsening symptoms.  Reportedly had sats in the 70s on EMS arrival although on his chronic 5 L of nasal cannula.  And route he received DuoNeb x 2 and continued tachypneic here in the ED.  Report of hemoptysis onset several hours ago.  He had had ongoing productive cough mixed with phlegm prior to that.  Patient felt to have tactile elevated temperature but had been given Tylenol  and route with EMS. Here in the ED he was in respiratory failure with sats 90% on 8 L nasal cannula. Here in the ED patient was started on BiPAP Hemoptysis noted with patient on Eliquis  Patient noted to have rhonchi on exam Leukocytosis with white count 13,800 BNP mildly elevated at 315 Chest x-Juletta Berhe with reported interval worsening of patchy consolidation in mid to lower lung fields consistent with pneumonia or aspiration small pleural effusions begin form  Chest CT per previous team is pending.  They voiced concern for cavitary lesion Patient has received IV antibiotics Venous blood gas pH 7.37 pCO2 of 54 Current sats are 96% Patient continues to have bilateral lower lobe rhonchi and increased work of breathing on BiPAP.  Patient continues tachypneic and attempted trial off BiPAP.   Patient had increased respiratory distress and decreased saturations and was placed back on BiPAP. Patient with worsening pneumonia on chest x-Wilmer Santillo and CT. Patient treated here with Rocephin  and Zithromax .  Elevated lactic noted first lactic acid 3 has trended down to 2.5. Patient's heart rate is 58 blood pressure is 121/66 current oxygen saturations are 92% on BiPAP Plan consult to critical care for admission for respiratory failure, pneumonia, and sepsis    Levander Houston, MD 07/19/24 [phone]

## 2024-07-19 NOTE — Progress Notes (Signed)
 Pharmacy Antibiotic Note  Nicholas [name] is a 73 y.o. male admitted on 07/19/2024 with concern for pna.  Pharmacy has been consulted for vancomycin  dosing.  Plan: Vancomycin  1750 mg IV x 1, then 1500 mg IV q 24h (eAUC 473) Monitor renal function, Cx/PCR to narrow Vancomycin  levels as indicated  Height: 6' (182.9 cm) Weight: 116.6 kg (257 lb) IBW/kg (Calculated) : 77.6  Temp (24hrs), Avg:99.2 F (37.3 C), Min:98.9 F (37.2 C), Max:99.7 F (37.6 C)  Recent Labs  Lab 07/14/24 1112 07/19/24 0627 07/19/24 0638 07/19/24 0642 07/19/24 0831  WBC 12.6* 13.8*  --   --   --   CREATININE 1.15 1.25*  --  1.20  --   LATICACIDVEN  --   --  3.0*  --  2.5*    Estimated Creatinine Clearance: 72.3 mL/min (by C-G formula based on SCr of 1.2 mg/dL).    Allergies  Allergen Reactions   Peanut-Containing Drug Products Anaphylaxis and Dermatitis   Yellow Jacket Venom [Bee Venom] Anaphylaxis and Other (See Comments)    Respiratory Distress   Celebrex [Celecoxib] Other (See Comments)    BLACK STOOL?MELENA?   Percocet [Oxycodone -Acetaminophen ] Hives, Swelling and Other (See Comments)   Latex Rash   Nsaids Rash    Dorn Poot, PharmD, [facility] Clinical Pharmacist ED Pharmacist Phone # [phone] 07/19/2024 1:17 PM

## 2024-07-19 NOTE — ED Notes (Signed)
 Pt reports hemoptysis a starting a couple hours ago, N/V x 3 days.

## 2024-07-19 NOTE — Progress Notes (Signed)
 Covid, RVP neg.   Airborne/contact, droplet precautions dc'd   Ronnald Gave MSN, AGACNP-BC [facility] Pulmonary/Critical Care Medicine 07/19/2024, 3:05 PM

## 2024-07-19 NOTE — ED Provider Notes (Signed)
  Physical Exam  BP 123/64   Pulse 79   Temp 98.9 F (37.2 C) (Axillary)   Resp (!) 42   Ht 1.829 m (6')   Wt 116.6 kg   SpO2 96%   BMI 34.86 kg/m   Physical Exam  Procedures  Procedures  ED Course / MDM   Clinical Course as of 07/19/24 0656  [place] Jul 19, 2024  0628 Questions circumscribed cavitary lesion in the right upper lobe.  Will need CTA of the chest for further delineation. [KH]    Clinical Course User Index [KH] Keith Sor, PA-C   Medical Decision Making Amount and/or Complexity of Data Reviewed Radiology: ordered.  Risk Decision regarding hospitalization.   73 yo male from facility with increased sob, on doxy, on 5 liters chronically,  Cxr- ?cavitary lesion rul CT currently Febrile tactile pre hospital and acetaminipophen On bipap 73 year old male presented last night with respiratory failure.  Patient requiring BiPAP for ventilatory support. Patient with pneumonia and elevated lactic consistent with sepsis Patient failed trial off BiPAP. Critical care consulted for admission has assumed care.          Levander Houston, MD 07/19/24 [phone]

## 2024-07-19 NOTE — Progress Notes (Signed)
 eLink Physician-Brief Progress Note Patient Name: Nicholas [name] DOB: [birth year] MRN: [id number]   Date of Service  07/19/2024  HPI/Events of Note  73 year old male initially presented with worsening shortness of breath in the setting of interstitial lung disease and chronic hypoxemic respiratory failure on baseline 5 L of oxygen.  Currently on BiPAP therapy and visually appears in no distress.  Complained of 10 out of 10 sharp right sided chest pain.  EKG performed with no acute ischemic changes.  eICU Interventions  Obtain chest radiograph, morphine  x 1 for pain, maintain BiPAP therapy.     Intervention Category Intermediate Interventions: Respiratory distress - evaluation and management  Cachet Mccutchen 07/19/2024, 8:35 PM

## 2024-07-19 NOTE — ED Provider Notes (Signed)
 [place] EMERGENCY DEPARTMENT AT [facility] Provider Note   CSN: [id number] Arrival date & time: 07/19/24  9390     Patient presents with: Respiratory Distress   Nicholas [name] is a 73 y.o. male.   HPI   73 year old male presenting to the emergency department with shortness of breath.  The patient has a history of chronic respiratory failure and is on 5 L O2 via nasal cannula at baseline.  Had oxygen saturations in the 70s on EMS arrival on his baseline home O2 requirement.  He received DuoNebs x 2 and route with some symptomatic relief.  He reports orthopnea. Patient reports progressive orthopnea, associated cough productive of phlegm mixed with blood. Hemoptysis onset a few hours ago. EMS notes tactile temp to touch, but unknown documented temperature. Given 650mg  Tylenol  PTA. Patient on Eliquis  for Afib. Last dose was last night. Supposed to have ablation for Afib on 07/23/24. Has been on doxycycline  at [facility] since 07/08/24 for suspected PNA.     Prior to Admission medications   Medication Sig Start Date End Date Taking? Authorizing Provider  acetaminophen  (TYLENOL ) 500 MG tablet Take 1,000 mg by mouth 3 (three) times daily.    [provider]  amitriptyline  (ELAVIL ) 50 MG tablet Take 0.5 tablets (25 mg total) by mouth in the morning and at bedtime. Patient taking differently: Take 25 mg by mouth at bedtime as needed. 03/08/23   Rilla Baller, MD  apixaban  (ELIQUIS ) 5 MG TABS tablet Take 1 tablet (5 mg total) by mouth 2 (two) times daily. 09/27/22   Rilla Baller, MD  atorvastatin  (LIPITOR) 20 MG tablet TAKE ONE TABLET BY MOUTH ONCE DAILY 02/21/22   Rilla Baller, MD  cetirizine  (ZYRTEC ) 10 MG tablet Take 1 tablet (10 mg total) by mouth daily. 07/25/22   Wendee Lynwood HERO, NP  Cholecalciferol  (VITAMIN D3) 25 MCG (1000 UT) CAPS Take 1 capsule (1,000 Units total) by mouth daily. 03/24/21   Rilla Baller, MD  cyanocobalamin  (VITAMIN B12) 1000 MCG tablet  Take 1 tablet (1,000 mcg total) by mouth daily. 03/08/23   Rilla Baller, MD  divalproex  (DEPAKOTE  ER) 500 MG 24 hr tablet Take 500 mg by mouth daily.    [provider]  DULoxetine (CYMBALTA) 30 MG capsule Take 30 mg by mouth daily.    [provider]  EPINEPHrine  0.3 mg/0.3 mL IJ SOAJ injection Inject 0.3 mg into the muscle as needed for anaphylaxis. (bee stings) 12/18/21   Rilla Baller, MD  ferrous sulfate  325 (65 FE) MG EC tablet Take 325 mg by mouth daily with breakfast. Patient taking differently: Take 325 mg by mouth daily as needed.    [provider]  flecainide (TAMBOCOR) 100 MG tablet Take 1 tablet (100 mg total) by mouth 2 (two) times daily. 07/14/24   Kennyth Chew, MD  furosemide  (LASIX ) 20 MG tablet Take 40 mg by mouth daily. 03/08/23   Rilla Baller, MD  gabapentin  (NEURONTIN ) 300 MG capsule Take 600 mg by mouth 3 (three) times daily. 300 mg in the am, 600 mg in the afternoon, and 600 mg in the evening 03/08/23   Rilla Baller, MD  gabapentin  (NEURONTIN ) 400 MG capsule Take 400 mg by mouth daily.    [provider]  HYDROcodone -acetaminophen  (NORCO/VICODIN) 5-325 MG tablet Take 1 tablet by mouth every 6 (six) hours as needed. 06/16/24   Laurice Maude BROCKS, MD  ipratropium-albuterol  (DUONEB) 0.5-2.5 (3) MG/3ML SOLN Inhale 3 mLs into the lungs every 6 (six) hours as needed.  07/08/24   [provider]  levothyroxine  (SYNTHROID ) 125 MCG tablet Take 1 tablet (125 mcg total) by mouth daily before breakfast. 03/08/23   Rilla Baller, MD  Menthol , Topical Analgesic, (BIOFREEZE) 4 % GEL Apply 1 Application topically daily. Neck pain    [provider]  methylPREDNISolone  (MEDROL  DOSEPAK) 4 MG TBPK tablet Dose per box instructions Patient not taking: Reported on 07/14/2024 12/30/23   Silver Wonda LABOR, PA  metoprolol  succinate (TOPROL -XL) 100 MG 24 hr tablet Take 1 tablet (100 mg total) by mouth at bedtime. Take with or immediately  following a meal. Patient taking differently: Take 50 mg by mouth at bedtime. Take with or immediately following a meal. 08/28/22   Jens Durand, MD  midodrine (PROAMATINE) 2.5 MG tablet Take 2.5 mg by mouth as needed. 01/31/24   [provider]  nitroGLYCERIN  (NITROSTAT ) 0.4 MG SL tablet Place 1 tablet (0.4 mg total) under the tongue every 5 (five) minutes as needed for chest pain. 11/27/21   Rilla Baller, MD  omeprazole  (PRILOSEC) 40 MG capsule Take 1 capsule (40 mg total) by mouth daily. Patient taking differently: Take 40 mg by mouth every other day. 06/13/23   Avram Lupita BRAVO, MD  ondansetron  (ZOFRAN -ODT) 4 MG disintegrating tablet TAKE ONE TABLET BY MOUTH every EIGHT hours AS NEEDED 10/04/22   Gayland Lauraine PARAS, NP  Probiotic Product (PROBIOTIC DAILY) CAPS Take 1 capsule by mouth. 1 capsule in the morning every 3 days    [provider]  Rimegepant Sulfate (NURTEC) 75 MG TBDP Take 1 tablet (75 mg total) by mouth as needed. 11/21/23   Gayland Lauraine PARAS, NP  sertraline (ZOLOFT) 25 MG tablet Take 25 mg by mouth daily. 06/18/24   [provider]  tamsulosin  (FLOMAX ) 0.4 MG CAPS capsule TAKE ONE CAPSULE BY MOUTH ONCE DAILY 11/20/22   Rilla Baller, MD  tiZANidine  (ZANAFLEX ) 4 MG tablet TAKE ONE TABLET BY MOUTH every SIX hours AS NEEDED FOR acute HEADACHE 10/04/22   Gayland Lauraine PARAS, NP  Ubrogepant  (UBRELVY ) 100 MG TABS Take 100 mg by mouth as needed (take 1 tablet at onset of headache, may repeat in hours, max is 200 mg in 24 hours). 07/24/22   Gayland Lauraine PARAS, NP    Allergies: Peanut-containing drug products, Yellow jacket venom [bee venom], Celebrex [celecoxib], Percocet [oxycodone -acetaminophen ], Latex, and Nsaids    Review of Systems  All other systems reviewed and are negative.   Updated Vital Signs BP 112/60   Pulse 68   Temp 99.7 F (37.6 C) (Rectal)   Resp (!) 30   Ht 6' (1.829 m)   Wt 116.6 kg   SpO2 100%   BMI 34.86 kg/m   Physical Exam Vitals and  nursing note reviewed.  Constitutional:      General: He is not in acute distress.    Appearance: He is well-developed.  HENT:     Head: Normocephalic and atraumatic.  Eyes:     Conjunctiva/sclera: Conjunctivae normal.  Cardiovascular:     Rate and Rhythm: Regular rhythm. Bradycardia present.  Pulmonary:     Effort: Pulmonary effort is normal. Tachypnea present. No respiratory distress.     Breath sounds: Rhonchi present.  Abdominal:     Palpations: Abdomen is soft.     Tenderness: There is no abdominal tenderness.  Musculoskeletal:        General: No swelling.     Cervical back: Neck supple.  Skin:    General: Skin is warm and dry.  Capillary Refill: Capillary refill takes less than 2 seconds.  Neurological:     Mental Status: He is alert.  Psychiatric:        Mood and Affect: Mood normal.     (all labs ordered are listed, but only abnormal results are displayed) Labs Reviewed  CBC WITH DIFFERENTIAL/PLATELET - Abnormal; Notable for the following components:      Result Value   WBC 13.8 (*)    Hemoglobin 12.9 (*)    Neutro Abs 10.3 (*)    Monocytes Absolute 2.1 (*)    All other components within normal limits  BRAIN NATRIURETIC PEPTIDE - Abnormal; Notable for the following components:   B Natriuretic Peptide 315.2 (*)    All other components within normal limits  COMPREHENSIVE METABOLIC PANEL WITH GFR - Abnormal; Notable for the following components:   Chloride 97 (*)    Glucose, Bld 133 (*)    Creatinine, Ser 1.25 (*)    Albumin  3.3 (*)    Total Bilirubin 3.7 (*)    All other components within normal limits  PROTIME-INR - Abnormal; Notable for the following components:   Prothrombin Time 20.0 (*)    INR 1.6 (*)    All other components within normal limits  I-STAT CHEM 8, ED - Abnormal; Notable for the following components:   Glucose, Bld 135 (*)    Calcium , Ion 1.11 (*)    All other components within normal limits  I-STAT VENOUS BLOOD GAS, ED - Abnormal;  Notable for the following components:   Bicarbonate 32.2 (*)    TCO2 34 (*)    Acid-Base Excess 6.0 (*)    Calcium , Ion 1.11 (*)    All other components within normal limits  I-STAT CG4 LACTIC ACID, ED - Abnormal; Notable for the following components:   Lactic Acid, Venous 3.0 (*)    All other components within normal limits  I-STAT CG4 LACTIC ACID, ED - Abnormal; Notable for the following components:   Lactic Acid, Venous 2.5 (*)    All other components within normal limits  CULTURE, BLOOD (ROUTINE X 2)  CULTURE, BLOOD (ROUTINE X 2)  TROPONIN I (HIGH SENSITIVITY)    EKG: None  Radiology: DG Chest Portable 1 View Result Date: 07/19/2024 CLINICAL DATA:  Shortness of breath with hypoxemia. EXAM: PORTABLE CHEST 1 VIEW COMPARISON:  Portable chest 06/16/2024. FINDINGS: There is a interval worsening patchy consolidation in the mid to lower lung fields consistent with pneumonia or aspiration. Small pleural effusions are beginning to form. There is a low inspiration with chronic elevation of the right hemidiaphragm. Stable cardiomegaly. No vascular congestion is seen. The mediastinum is unchanged with widening superiorly probably related to lipomatosis. There is calcification of the transverse aorta. Bilateral shoulder replacements. Degenerative change of the spine. IMPRESSION: 1. Interval worsening of patchy consolidation in the mid to lower lung fields consistent with pneumonia or aspiration. 2. Small pleural effusions are beginning to form. 3. Stable cardiomegaly. Electronically Signed   By: Francis Quam M.D.   On: 07/19/2024 06:46     .Critical Care  Performed by: Jerrol Agent, MD Authorized by: Jerrol Agent, MD   Critical care provider statement:    Critical care time (minutes):  30   Critical care was necessary to treat or prevent imminent or life-threatening deterioration of the following conditions:  Respiratory failure   Critical care was time spent personally by me on the  following activities:  Development of treatment plan with patient or surrogate, discussions with consultants,  evaluation of patient's response to treatment, examination of patient, ordering and review of laboratory studies, ordering and review of radiographic studies, ordering and performing treatments and interventions, pulse oximetry, re-evaluation of patient's condition and review of old charts    Medications Ordered in the ED  cefTRIAXone  (ROCEPHIN ) 1 g in sodium chloride  0.9 % 100 mL IVPB (0 g Intravenous Stopped 07/19/24 0750)  azithromycin  (ZITHROMAX ) 500 mg in sodium chloride  0.9 % 250 mL IVPB (0 mg Intravenous Stopped 07/19/24 0824)    Clinical Course as of 07/19/24 0837  Sun Jul 19, 2024  0628 Questions circumscribed cavitary lesion in the right upper lobe.  Will need CTA of the chest for further delineation. [KH]    Clinical Course User Index [KH] Keith Sor, PA-C                                 Medical Decision Making Amount and/or Complexity of Data Reviewed Radiology: ordered.    73 year old male presenting to the emergency department with shortness of breath.  The patient has a history of chronic respiratory failure and is on 5 L O2 via nasal cannula at baseline.  Had oxygen saturations in the 70s on EMS arrival on his baseline home O2 requirement.  He received DuoNebs x 2 and route with some symptomatic relief.  He reports orthopnea. Patient reports progressive orthopnea, associated cough productive of phlegm mixed with blood. Hemoptysis onset a few hours ago. EMS notes tactile temp to touch, but unknown documented temperature. Given 650mg  Tylenol  PTA. Patient on Eliquis  for Afib. Last dose was last night. Supposed to have ablation for Afib on 07/23/24. Has been on doxycycline  at [facility] since 07/08/24 for suspected PNA.   On arrival, the patient was afebrile, not tachycardic heart rate 80, tachypneic respiratory rate in the 30s and 40s, BP 130/67, saturating 94% on a  nonrebreather.  On exam the patient had rhonchi present, was tachypneic.  IV access was obtained and laboratory evaluation was initiated.  The patient was started on BiPAP.  CXR: IMPRESSION:  1. Interval worsening of patchy consolidation in the mid to lower  lung fields consistent with pneumonia or aspiration.  2. Small pleural effusions are beginning to form.  3. Stable cardiomegaly.   Labs: VBG with a pH of 7.38, pCO2 55, HCO3 32, CMP with a creatinine of 1.25, CBC with leukocytosis to 13.8, BNP moderately elevated to 315, troponin 10, lactic acid elevated 3.0, blood cultures collected and pending.  With the findings on chest x-ray, the patient was covered with antibiotics including Rocephin  and azithromycin .  Plan at time of signout to likely admit the patient.  CT imaging ordered given the patient's worsening hypoxic respiratory failure in the setting of hemoptysis to evaluate for mass.  Patient is on Eliquis .  Signout given to Dr. Levander at 0 730 to reassess the patient, follow-up on results of CT imaging, likely plan for admission.     Final diagnoses:  Acute respiratory failure with hypoxia [facility])    ED Discharge Orders     None          Jerrol Agent, MD 07/19/24 [phone]

## 2024-07-19 NOTE — H&P (Signed)
 NAME:  Nicholas [name], MRN:  [id number], DOB:  11/22/1950, LOS: 0 ADMISSION DATE:  07/19/2024, CONSULTATION DATE:  07/19/24 REFERRING MD:  Levander , CHIEF COMPLAINT:  SOB    History of Present Illness:  73yo M PMG CVA, afib on eliquis , OSA, ILD w chronic hypoxia on 5L who presented to ED from facility 07/19/24 w ongoing SOB. Apparently started doxy for presumed PNA at facility 9/24, has not helped.  Went for CTA chest here which didn't show a central PE, but non diagnositic for segmental/sub segmental. Does have Diffuse GGO BLL  Was placed on BiPAP in ED for WOB/hypoxia, seemed to improve but when he was trialed off he desatted requiring prompt replacement. Rcvd rocephin  azithro   PCCM to admit in this setting    Compliant w eliquis . Progressive LLE edema. Persistent SOB, productive yellow/tan cough. Uses albuterol  3x/d. Abx at facility havent been helpful. Covid went around recently   Pertinent  Medical History  ILD Chronic hypoxia Afib Chronic AC OSA CVA   Significant Hospital Events: Including procedures, antibiotic start and stop dates in addition to other pertinent events   10/5 ED from facility for SOB admit to ICU on bipap   Interim History / Subjective:  On bipap   Objective    Blood pressure 127/77, pulse 63, temperature 98.9 F (37.2 C), temperature source Oral, resp. rate 16, height 6' (1.829 m), weight 116.6 kg, SpO2 99%.    FiO2 (%):  [5 %-50 %] 5 % PEEP:  [8 cmH20] 8 cmH20 Pressure Support:  [8 cmH20] 8 cmH20   Intake/Output Summary (Last 24 hours) at 07/19/2024 1309 Last data filed at 07/19/2024 1306 Gross per 24 hour  Intake 350 ml  Output 300 ml  Net 50 ml   Filed Weights   07/19/24 9366  Weight: 116.6 kg    Examination: General: chronically and acutely ill older adult M NAD  HENT: NCAT on bipap. Beard limiting Bipap seal.  Lungs: rhonchi, incr RR  Cardiovascular: cap refill < 3 sec  Abdomen: soft round  Extremities: L transmet amputation. LLE edema   Neuro: AAOx4  GU: defer  Resolved problem list   Assessment and Plan    AoC hypoxic resp failure ILD OSA Sepsis 2/2 Multifocal PNA Possible pulm edema  P -admit ICU -cont bipap. NPO sips w meds -vanc zosyn azithro -legionella, strep pneumo, covid/flu, RVP, sputum cx, PCT  -40mg  lasix  given elevated BNP  -nebs  -would want to be intubated if he declined and bipap were inadequate   Afib on Eliquis  P -cont flecanide, eliquis   Elevated LA 2/2 sepsis  -downtrending -follow PRN   LLE edema -compliant w eliquis  so think DVT unlikely but will get US    Labs   CBC: Recent Labs  Lab 07/14/24 1112 07/19/24 0627 07/19/24 0642 07/19/24 0643  WBC 12.6* 13.8*  --   --   NEUTROABS  --  10.3*  --   --   HGB 14.0 12.9* 13.3 13.3  HCT 43.1 40.7 39.0 39.0  MCV 95 95.3  --   --   PLT 261 216  --   --     Basic Metabolic Panel: Recent Labs  Lab 07/14/24 1112 07/19/24 0627 07/19/24 0642 07/19/24 0643  NA 140 140 140 140  K 4.1 3.8 3.8 3.8  CL 99 97* 99  --   CO2 24 28  --   --   GLUCOSE 124* 133* 135*  --   BUN 22 20 22   --  CREATININE 1.15 1.25* 1.20  --   CALCIUM  9.1 9.1  --   --    GFR: Estimated Creatinine Clearance: 72.3 mL/min (by C-G formula based on SCr of 1.2 mg/dL). Recent Labs  Lab 07/14/24 1112 07/19/24 0627 07/19/24 0638 07/19/24 0831  WBC 12.6* 13.8*  --   --   LATICACIDVEN  --   --  3.0* 2.5*    Liver Function Tests: Recent Labs  Lab 07/19/24 0627  AST 25  ALT 10  ALKPHOS 69  BILITOT 3.7*  PROT 7.8  ALBUMIN  3.3*   No results for input(s): LIPASE, AMYLASE in the last 168 hours. No results for input(s): AMMONIA in the last 168 hours.  ABG    Component Value Date/Time   PHART 7.498 (H) 09/18/2008 1305   PCO2ART 24.9 (L) 09/18/2008 1305   PO2ART 72.1 (L) 09/18/2008 1305   HCO3 32.2 (H) 07/19/2024 0643   TCO2 34 (H) 07/19/2024 0643   ACIDBASEDEF 3.6 (H) 09/18/2008 1305   O2SAT 71 07/19/2024 0643     Coagulation  Profile: Recent Labs  Lab 07/19/24 0627  INR 1.6*    Cardiac Enzymes: No results for input(s): CKTOTAL, CKMB, CKMBINDEX, TROPONINI in the last 168 hours.  HbA1C: Hgb A1c MFr Bld  Date/Time Value Ref Range Status  07/13/2010 08:12 PM 5.6 <5.7 % Final    Comment:    See lab report for associated comment(s)  12/08/2008 04:33 PM  4.6 - 6.1 % Final   5.2 (NOTE)   The ADA recommends the following therapeutic goal for glycemic   control related to Hgb A1C measurement:   Goal of Therapy:   < 7.0% Hgb A1C   Reference: American Diabetes Association: Clinical Practice   Recommendations 2008, Diabetes Care,  2008, 31:(Suppl 1).    CBG: No results for input(s): GLUCAP in the last 168 hours.  Review of Systems:   Review of Systems  Constitutional:  Positive for malaise/fatigue.  Respiratory:  Positive for cough, sputum production, shortness of breath and wheezing.   Cardiovascular:  Positive for chest pain and leg swelling.  Skin: Negative.      Past Medical History:  He,  has a past medical history of Acute kidney failure (08/2008), Acute meningitis (11/06/2022), Allergy, Anxiety, Arthritis, Asthma, Atrial fibrillation (HCC), Blood transfusion, CHF (congestive heart failure) (HCC), CLOSTRIDIUM DIFFICILE COLITIS (07/04/2010), COVID-19 08/12/22 completed Paxlovid  08/20/22 (08/26/2022), Depression with anxiety, Duodenitis determined by biopsy (02/2016), ECZEMA (07/04/2010), Elevated liver enzymes, Fatty liver, Full dentures, GERD (gastroesophageal reflux disease), Heart attack (HCC), History of hiatal hernia, History of stomach ulcers, Hypothyroidism, Lower GI bleeding, MALAR AND MAXILLARY BONES CLOSED FRACTURE (07/04/2010), Migraine, MRSA (methicillin resistant Staphylococcus aureus), Personal history of colonic adenoma (06/01/2003), PFO (patent foramen ovale), Pneumonia (08/2008), Pneumonia due to COVID-19 virus (08/12/2022), Seizures (HCC) (2009), Sleep apnea, Stroke (HCC) (2010), and  Wears glasses.   Surgical History:   Past Surgical History:  Procedure Laterality Date   AMPUTATION Left 01/24/2022   Procedure: LEFT GREAT TOE AMPUTATION AT METATARSOPHALANGEAL JOINT AND SECOND TOE AMPUTATION;  Surgeon: Harden Jerona GAILS, MD;  Location: [facility] OR;  Service: Orthopedics;  Laterality: Left;   anterior  nerve transposition  07/2009   left ulnar nerve   antibiotic spacer exchange  11/2008; 08/2006   right knee   ARTHROTOMY  08/2008   right knee w/I&D   CARDIOVASCULAR STRESS TEST  2013   stress EKG - negative for ischemia, 4 min 7.3 METs, normal blood pressure response   CARDIOVERSION N/A 08/24/2022  Procedure: CARDIOVERSION;  Surgeon: Perla Evalene PARAS, MD;  Location: [facility] ORS;  Service: Cardiovascular;  Laterality: N/A;   CATARACT EXTRACTION     COLONOSCOPY  11/2012   diverticulosis, rpt 10 yrs Ollen)   ESOPHAGOGASTRODUODENOSCOPY  02/2016   erosive gastropathy with duodenal erosions Ollen)   HARDWARE REMOVAL  04/2006   right knee w/antibiotic spacers placed   INGUINAL HERNIA REPAIR  early 1990's   bilateral   JOINT REPLACEMENT Bilateral    KNEE ARTHROSCOPY  10/2001   right   KNEE FUSION  03/2009   right knee removal; antibiotic spacers;    LEFT HEART CATH AND CORONARY ANGIOGRAPHY N/A 02/04/2018    WNL (Patwardhan, Newman PARAS, MD)   LUMBAR LAMINECTOMY/DECOMPRESSION MICRODISCECTOMY N/A 12/05/2018   Procedure: LUMBAR THREE TO FOUR AND LUMBAR FOUR TO FIVE DECOMPRESSION;  Surgeon: Barbarann Oneil BROCKS, MD;  Location: [facility] OR;  Service: Orthopedics;  Laterality: N/A;   MULTIPLE TOOTH EXTRACTIONS     REPLACEMENT TOTAL KNEE  08/2008; 09/2001   right   REVERSE SHOULDER ARTHROPLASTY Left 09/03/2017   Procedure: REVERSE SHOULDER ARTHROPLASTY;  Surgeon: Addie Cordella Hamilton, MD;  Location: [facility] OR;  Service: Orthopedics;  Laterality: Left;   REVERSE SHOULDER ARTHROPLASTY Right 05/13/2018   Procedure: RIGHT REVERSE SHOULDER ARTHROPLASTY;  Surgeon: Addie Cordella Hamilton, MD;  Location: [facility] OR;   Service: Orthopedics;  Laterality: Right;   rod placement Right 03/2009   R knee   SYNOVECTOMY  06/2005   debridement, liner exchange right knee   TEE WITHOUT CARDIOVERSION N/A 08/24/2022   Procedure: TRANSESOPHAGEAL ECHOCARDIOGRAM (TEE);  Surgeon: Perla Evalene PARAS, MD;  Location: [facility] ORS;  Service: Cardiovascular;  Laterality: N/A;   TOE AMPUTATION Left 08/2008   great toe - osteomyelitis (staph infection)   TOE AMPUTATION Left 04/2016   2nd toe Billi)   TONSILLECTOMY     TOTAL KNEE ARTHROPLASTY Left 01/15/2017   TOTAL KNEE ARTHROPLASTY Left 01/15/2017   Procedure: TOTAL KNEE ARTHROPLASTY;  Surgeon: Hamilton Cordella Addie, MD;  Location: [facility] OR;  Service: Orthopedics;  Laterality: Left;     Social History:   reports that he has never smoked. He has been exposed to tobacco smoke. He has never used smokeless tobacco. He reports that he does not drink alcohol and does not use drugs.   Family History:  His family history includes Breast cancer in his sister; CAD (age of onset: 63) in his mother; Coronary artery disease (age of onset: 51) in his father; Diabetes in his brother and sister; Stroke in his father. There is no history of Colon cancer or Stomach cancer.   Allergies Allergies  Allergen Reactions   Peanut-Containing Drug Products Anaphylaxis and Dermatitis   Yellow Jacket Venom [Bee Venom] Anaphylaxis and Other (See Comments)    Respiratory Distress   Celebrex [Celecoxib] Other (See Comments)    BLACK STOOL?MELENA?   Percocet [Oxycodone -Acetaminophen ] Hives, Swelling and Other (See Comments)   Latex Rash   Nsaids Rash     Home Medications  Prior to Admission medications   Medication Sig Start Date End Date Taking? Authorizing Provider  acetaminophen  (TYLENOL ) 500 MG tablet Take 1,000 mg by mouth 3 (three) times daily.   Yes [provider]  aluminum-magnesium  hydroxide 200-200 MG/5ML suspension Take 20 mLs by mouth every 6 (six) hours as needed (HEARTBURN).   Yes  [provider]  apixaban  (ELIQUIS ) 5 MG TABS tablet Take 1 tablet (5 mg total) by mouth 2 (two) times daily. 09/27/22  Yes Rilla Baller, MD  atorvastatin  (  LIPITOR) 10 MG tablet Take 10 mg by mouth daily. 06/07/24  Yes [provider]  calcium  carbonate (TUMS - DOSED IN MG ELEMENTAL CALCIUM ) 500 MG chewable tablet Chew 2 tablets by mouth every other day.   Yes [provider]  Cholecalciferol  (VITAMIN D -3) 25 MCG (1000 UT) CAPS Take 1 capsule by mouth every 7 (seven) days.   Yes [provider]  cyanocobalamin  (VITAMIN B12) 1000 MCG tablet Take 1 tablet (1,000 mcg total) by mouth daily. 03/08/23  Yes Rilla Baller, MD  divalproex  (DEPAKOTE  ER) 500 MG 24 hr tablet Take 500 mg by mouth daily.   Yes [provider]  doxycycline  (DORYX ) 100 MG EC tablet Take 100 mg by mouth 2 (two) times daily. FOR 10 DAYS STARTED 9.24.2025   Yes [provider]  EPINEPHrine  0.3 mg/0.3 mL IJ SOAJ injection Inject 0.3 mg into the muscle as needed for anaphylaxis. (bee stings) 12/18/21  Yes Rilla Baller, MD  flecainide (TAMBOCOR) 100 MG tablet Take 1 tablet (100 mg total) by mouth 2 (two) times daily. 07/14/24  Yes Kennyth Chew, MD  furosemide  (LASIX ) 20 MG tablet Take 40 mg by mouth daily. 03/08/23  Yes Rilla Baller, MD  gabapentin  (NEURONTIN ) 300 MG capsule Take 600 mg by mouth 2 (two) times daily. AFTEROON AND EVENING 03/08/23  Yes Rilla Baller, MD  gabapentin  (NEURONTIN ) 400 MG capsule Take 400 mg by mouth in the morning.   Yes [provider]  guaifenesin  (ROBITUSSIN) 100 MG/5ML syrup Take 10 mLs by mouth every 4 (four) hours as needed for cough.   Yes [provider]  HYDROcodone -acetaminophen  (NORCO/VICODIN) 5-325 MG tablet Take 1 tablet by mouth every 6 (six) hours as needed. 06/16/24  Yes Laurice Maude BROCKS, MD  ipratropium-albuterol  (DUONEB) 0.5-2.5 (3) MG/3ML SOLN Inhale 3 mLs into the lungs every 8 (eight) hours as needed  (SOB). 07/08/24  Yes [provider]  levothyroxine  (SYNTHROID ) 125 MCG tablet Take 1 tablet (125 mcg total) by mouth daily before breakfast. 03/08/23  Yes Rilla Baller, MD  lidocaine  (ASPERCREME LIDOCAINE ) 4 % Place 1 patch onto the skin. EVERY 24 HOURS AS NEEDED   Yes [provider]  lidocaine  4 % Place 1 patch onto the skin daily. KNEE   Yes [provider]  liver oil-zinc  oxide (DESITIN) 40 % ointment Apply 1 Application topically as needed for irritation. APPLY TO GROIN AND BUTTOCKS TOPICALLY EVERY DAY AND NIGHT SHIFT FOR APPLY WITH EACH INCONTINENT EPISODE   Yes [provider]  Menthol , Topical Analgesic, (BIOFREEZE) 4 % GEL Apply 1 Application topically daily as needed (NECK PAIN).   Yes [provider]  metoprolol  succinate (TOPROL -XL) 100 MG 24 hr tablet Take 1 tablet (100 mg total) by mouth at bedtime. Take with or immediately following a meal. Patient taking differently: Take 50 mg by mouth at bedtime. Take with or immediately following a meal. 08/28/22  Yes Jens Durand, MD  midodrine (PROAMATINE) 2.5 MG tablet Take 2.5 mg by mouth as needed. 01/31/24  Yes [provider]  nitroGLYCERIN  (NITROSTAT ) 0.4 MG SL tablet Place 1 tablet (0.4 mg total) under the tongue every 5 (five) minutes as needed for chest pain. 11/27/21  Yes Rilla Baller, MD  omeprazole  (PRILOSEC) 40 MG capsule Take 1 capsule (40 mg total) by mouth daily. Patient taking differently: Take 40 mg by mouth every other day. 06/13/23  Yes Avram Lupita BRAVO, MD  ondansetron  (ZOFRAN ) 4 MG tablet Take 4 mg by mouth every 8 (eight) hours as  needed for nausea or vomiting.   Yes [provider]  ondansetron  (ZOFRAN -ODT) 4 MG disintegrating tablet TAKE ONE TABLET BY MOUTH every EIGHT hours AS NEEDED Patient taking differently: Take 4 mg by mouth every 6 (six) hours as needed for nausea or vomiting. 10/04/22  Yes Gayland Lauraine PARAS, NP  Probiotic Product (PROBIOTIC DAILY)  CAPS Take 1 capsule by mouth.   Yes [provider]  sertraline (ZOLOFT) 25 MG tablet Take 25 mg by mouth daily. 06/18/24  Yes [provider]  tamsulosin  (FLOMAX ) 0.4 MG CAPS capsule TAKE ONE CAPSULE BY MOUTH ONCE DAILY 11/20/22  Yes Rilla Baller, MD  tiZANidine  (ZANAFLEX ) 4 MG tablet TAKE ONE TABLET BY MOUTH every SIX hours AS NEEDED FOR acute HEADACHE 10/04/22  Yes Gayland Lauraine PARAS, NP  Ubrogepant  (UBRELVY ) 100 MG TABS Take 100 mg by mouth as needed (take 1 tablet at onset of headache, may repeat in hours, max is 200 mg in 24 hours). 07/24/22  Yes Gayland Lauraine PARAS, NP  amitriptyline  (ELAVIL ) 50 MG tablet Take 0.5 tablets (25 mg total) by mouth in the morning and at bedtime. Patient not taking: Reported on 07/19/2024 03/08/23   Rilla Baller, MD  atorvastatin  (LIPITOR) 20 MG tablet TAKE ONE TABLET BY MOUTH ONCE DAILY Patient not taking: Reported on 07/19/2024 02/21/22   Rilla Baller, MD  cetirizine  (ZYRTEC ) 10 MG tablet Take 1 tablet (10 mg total) by mouth daily. Patient not taking: Reported on 07/19/2024 07/25/22   Wendee Lynwood HERO, NP  Cholecalciferol  (VITAMIN D3) 25 MCG (1000 UT) CAPS Take 1 capsule (1,000 Units total) by mouth daily. Patient not taking: Reported on 07/19/2024 03/24/21   Rilla Baller, MD  DULoxetine (CYMBALTA) 30 MG capsule Take 30 mg by mouth daily.    [provider]  ferrous sulfate  325 (65 FE) MG EC tablet Take 325 mg by mouth daily with breakfast. Patient not taking: Reported on 07/19/2024    [provider]  methylPREDNISolone  (MEDROL  DOSEPAK) 4 MG TBPK tablet Dose per box instructions Patient not taking: Reported on 07/14/2024 12/30/23   Silver Wonda LABOR, PA  Rimegepant Sulfate (NURTEC) 75 MG TBDP Take 1 tablet (75 mg total) by mouth as needed. Patient not taking: Reported on 07/19/2024 11/21/23   Gayland Lauraine PARAS, NP     Critical care time: 45 min       CRITICAL CARE Performed by: Ronnald FORBES Gave   Total critical care time: 45  minutes  Critical care time was exclusive of separately billable procedures and treating other patients. Critical care was necessary to treat or prevent imminent or life-threatening deterioration.  Critical care was time spent personally by me on the following activities: development of treatment plan with patient and/or surrogate as well as nursing, discussions with consultants, evaluation of patient's response to treatment, examination of patient, obtaining history from patient or surrogate, ordering and performing treatments and interventions, ordering and review of laboratory studies, ordering and review of radiographic studies, pulse oximetry and re-evaluation of patient's condition.  Ronnald Gave MSN, AGACNP-BC [facility] Amion for pager  07/19/2024, 1:45 PM

## 2024-07-19 NOTE — ED Triage Notes (Signed)
 Pt bib EMS from [place] nursing [facility] for respiratory distress ongoing all night. Pt diagnosed with PNA 1 week ago. EMS reports patient 70% on RA initially. 5L [place] baseline. 90% on 8L [place]. EMS gave 2 duoneb, 650 tylenol  PO en route. Hx of CHF, pulmonary disease, PVD.

## 2024-07-19 NOTE — ED Notes (Signed)
 Pt transported to CT via stretcher.

## 2024-07-19 NOTE — ED Provider Triage Note (Signed)
 Emergency Medicine Provider Triage Evaluation Note  Nicholas [name] , a 73 y.o. male  was evaluated in triage.  Pt complains of SOB. Presenting via EMS for worsening symptoms tonight. Hx of CRF on 5L O2 via [place] at baseline. Had sats in the 70's on EMS arrival at his chronic O2 requirement. Received Duoneb x 2 en route with some symptomatic relief, though persistently tachypneic.   Patient reports progressive orthopnea, associated cough productive of phlegm mixed with blood. Hemoptysis onset a few hours ago. EMS notes tactile temp to touch, but unknown documented temperature. Given 650mg  Tylenol  PTA.   Patient on Eliquis  for Afib. Last dose was last night. Supposed to have ablation for Afib on 07/23/24. Has been on doxycycline  at [facility] since 07/08/24 for suspected PNA.  Review of Systems  Positive: As above Negative: Leg swelling, vomiting, syncope  Physical Exam  Pulse 80   Resp (!) 38   Ht 6' (1.829 m)   Wt 116.6 kg   SpO2 94%   BMI 34.86 kg/m  Gen:   Alert, ill and anxious appearing Resp:  Tachypnea into the 40's with dyspnea. Rhonchi noted, worse on left. Decreased right lung base. MSK:   Moves extremities without difficulty  Other:  Cough productive of phlegm streaked with blood.  Medical Decision Making  Medically screening exam initiated at 6:34 AM.  Appropriate orders placed.  MAVRICK MCQUIGG was informed that the remainder of the evaluation will be completed by another provider, this initial triage assessment does not replace that evaluation, and the importance of remaining in the ED until their evaluation is complete.  Undifferentiated SOB - on abx for PNA. Also with chronic O2 requirement. Known CHF w/EF 55-60% in June 2025. Chronically anticoagulated on Eliquis .  Port CXR w/concern for RUL cavitary lesion - CT ordered. Labs pending.   Keith Sor, PA-C 07/19/24 [phone]

## 2024-07-20 DIAGNOSIS — R739 Hyperglycemia, unspecified: Secondary | ICD-10-CM

## 2024-07-20 LAB — BASIC METABOLIC PANEL WITH GFR
Anion gap: 13 (ref 5–15)
BUN: 21 mg/dL (ref 8–23)
CO2: 27 mmol/L (ref 22–32)
Calcium: 8.8 mg/dL — ABNORMAL LOW (ref 8.9–10.3)
Chloride: 99 mmol/L (ref 98–111)
Creatinine, Ser: 1.17 mg/dL (ref 0.61–1.24)
GFR, Estimated: >60 mL/min (ref >60)
Glucose, Bld: 167 mg/dL — ABNORMAL HIGH (ref 70–99)
Potassium: 3.6 mmol/L (ref 3.5–5.1)
Sodium: 139 mmol/L (ref 135–145)

## 2024-07-20 LAB — POCT I-STAT 7, (LYTES, BLD GAS, ICA,H+H)
Acid-Base Excess: 5 mmol/L — ABNORMAL HIGH (ref 0.0–2.0)
Bicarbonate: 30.4 mmol/L — ABNORMAL HIGH (ref 20.0–28.0)
Calcium, Ion: 1.22 mmol/L (ref 1.15–1.40)
HCT: 36 % — ABNORMAL LOW (ref 39.0–52.0)
Hemoglobin: 12.2 g/dL — ABNORMAL LOW (ref 13.0–17.0)
O2 Saturation: 95 %
Potassium: 3.6 mmol/L (ref 3.5–5.1)
Sodium: 141 mmol/L (ref 135–145)
TCO2: 32 mmol/L (ref 22–32)
pCO2 arterial: 47.5 mmHg (ref 32–48)
pH, Arterial: 7.414 (ref 7.35–7.45)
pO2, Arterial: 78 mmHg — ABNORMAL LOW (ref 83–108)

## 2024-07-20 LAB — CBC
HCT: 36.6 % — ABNORMAL LOW (ref 39.0–52.0)
Hemoglobin: 11.7 g/dL — ABNORMAL LOW (ref 13.0–17.0)
MCH: 30.2 pg (ref 26.0–34.0)
MCHC: 32 g/dL (ref 30.0–36.0)
MCV: 94.3 fL (ref 80.0–100.0)
Platelets: 200 K/uL (ref 150–400)
RBC: 3.88 MIL/uL — ABNORMAL LOW (ref 4.22–5.81)
RDW: 13.8 % (ref 11.5–15.5)
WBC: 7.8 K/uL (ref 4.0–10.5)
nRBC: 0 % (ref 0.0–0.2)

## 2024-07-20 LAB — HEMOGLOBIN A1C
Hgb A1c MFr Bld: 5.7 % — ABNORMAL HIGH (ref 4.8–5.6)
Mean Plasma Glucose: 116.89 mg/dL

## 2024-07-20 LAB — GLUCOSE, CAPILLARY: Glucose-Capillary: 130 mg/dL — ABNORMAL HIGH (ref 70–99)

## 2024-07-20 MED ORDER — PENTAFLUOROPROP-TETRAFLUOROETH EX AERO
INHALATION_SPRAY | CUTANEOUS | Status: AC
  Filled 2024-07-20: qty 30

## 2024-07-20 MED ORDER — ORAL CARE MOUTH RINSE
15.0000 mL | OROMUCOSAL | Status: DC
  Administered 2024-07-20 – 2024-07-23 (×8): 15 mL via OROMUCOSAL

## 2024-07-20 MED ORDER — LORAZEPAM 2 MG/ML IJ SOLN
2.0000 mg | Freq: Once | INTRAMUSCULAR | Status: DC
  Filled 2024-07-20: qty 1

## 2024-07-20 MED ORDER — LORAZEPAM 2 MG/ML IJ SOLN: 2.0000 mg | Freq: Once | INTRAMUSCULAR | Status: DC | PRN

## 2024-07-20 MED ORDER — INSULIN ASPART 100 UNIT/ML IJ SOLN
0.0000 [IU] | INTRAMUSCULAR | Status: DC
  Administered 2024-07-20 – 2024-07-22 (×7): 3 [IU] via SUBCUTANEOUS
  Administered 2024-07-22: 4 [IU] via SUBCUTANEOUS
  Administered 2024-07-22 – 2024-07-23 (×2): 3 [IU] via SUBCUTANEOUS

## 2024-07-20 MED ORDER — VALPROATE SODIUM 100 MG/ML IV SOLN
500.0000 mg | Freq: Every day | INTRAVENOUS | Status: DC
  Administered 2024-07-20: 500 mg via INTRAVENOUS
  Filled 2024-07-20 (×2): qty 5

## 2024-07-20 MED ORDER — LORAZEPAM 2 MG/ML IJ SOLN
2.0000 mg | Freq: Once | INTRAMUSCULAR | Status: AC
  Administered 2024-07-20: 1 mg via INTRAVENOUS
  Filled 2024-07-20: qty 1

## 2024-07-20 MED ORDER — ORAL CARE MOUTH RINSE: 15.0000 mL | OROMUCOSAL | Status: DC | PRN

## 2024-07-20 MED ORDER — PANTOPRAZOLE SODIUM 40 MG IV SOLR
40.0000 mg | INTRAVENOUS | Status: DC
  Administered 2024-07-20 – 2024-07-22 (×3): 40 mg via INTRAVENOUS
  Filled 2024-07-20 (×3): qty 10

## 2024-07-20 MED ORDER — POTASSIUM CHLORIDE 10 MEQ/100ML IV SOLN
10.0000 meq | INTRAVENOUS | Status: AC
  Administered 2024-07-20 (×4): 10 meq via INTRAVENOUS
  Filled 2024-07-20 (×4): qty 100

## 2024-07-20 MED ORDER — LACTATED RINGERS IV SOLN
INTRAVENOUS | Status: DC
Start: 2024-07-20 — End: 2024-07-21

## 2024-07-20 MED ORDER — PROPOFOL 1000 MG/100ML IV EMUL
INTRAVENOUS | Status: AC
  Filled 2024-07-20: qty 100

## 2024-07-20 NOTE — Progress Notes (Signed)
 [facility] ADULT ICU REPLACEMENT PROTOCOL   The patient does apply for the [facility] Adult ICU Electrolyte Replacment Protocol based on the criteria listed below:   1.Exclusion criteria: TCTS, ECMO, Dialysis, and Myasthenia Gravis patients 2. Is GFR >/= 30 ml/min? Yes.    Patient's GFR today is >60 3. Is SCr </= 2? Yes.   Patient's SCr is 1.17 mg/dL 4. Did SCr increase >/= 0.5 in 24 hours? No. 5.Pt's weight >40kg  Yes.   6. Abnormal electrolyte(s): K  7. Electrolytes replaced per protocol 8.  Call MD STAT for K+ </= 2.5, Phos </= 1, or Mag </= 1 Physician:  Haze Hunter BRAVO Kiyoko Mcguirt 07/20/2024 6:22 AM

## 2024-07-20 NOTE — TOC Progression Note (Addendum)
 Transition of Care [facility]) - Progression Note    Patient Details  Name: Nicholas [name] MRN: [id number] Date of Birth: 1951/07/05  [facility] CM/SW Contact  Lendia Dais, [place] Phone Number: 07/20/2024, 12:13 PM  Clinical Narrative:  Pt is from [facility] and pt is agreeable to returning. CSW spoke to the son [name] at bedside. Pt is currently on bipap and is declining to being intubated.   [name] stated he would like to wait until the pt's sister Jenkins comes in and has a conversation about the pt's wishes/GOC. [name] stated that he is not the [facility] and does not want to make decisions for the patient and would like to wait until the pt progresses before making any decisions. CSW stated they will give them time to talk things over and can request to see SW if needed.  CSW will continue to monitor.    Expected Discharge Plan: Skilled Nursing Facility Barriers to Discharge: Continued Medical Work up               Expected Discharge Plan and Services In-house Referral: Clinical Social Work     Living arrangements for the past 2 months: Skilled Nursing Facility                                       Social Drivers of Health (SDOH) Interventions SDOH Screenings   Food Insecurity: No Food Insecurity (07/20/2024)  Housing: Unknown (07/20/2024)  Transportation Needs: No Transportation Needs (07/20/2024)  Utilities: Not At Risk (07/20/2024)  Alcohol Screen: Low Risk  (02/28/2022)  Depression (PHQ2-9): Low Risk  (12/11/2022)  Recent Concern: Depression (PHQ2-9) - Medium Risk (10/30/2022)  Financial Resource Strain: Low Risk  (12/11/2022)  Physical Activity: Insufficiently Active (12/11/2022)  Social Connections: Moderately Isolated (07/20/2024)  Stress: No Stress Concern Present (12/11/2022)  Tobacco Use: Low Risk  (07/19/2024)    Readmission Risk Interventions    11/07/2022    9:43 AM  Readmission Risk Prevention Plan  Transportation Screening Complete   Medication Review (RN Care Manager) Complete  PCP or Specialist appointment within 3-5 days of discharge Complete  HRI or Home Care Consult Complete  SW Recovery Care/Counseling Consult Complete  Palliative Care Screening Not Applicable  Skilled Nursing Facility Complete

## 2024-07-20 NOTE — Progress Notes (Addendum)
 eLink Physician-Brief Progress Note Patient Name: Nicholas [name] DOB: 19-Sep-1951 MRN: [id number]   Date of Service  07/20/2024  HPI/Events of Note  Patient with acute on chronic hypoxemic respiratory failure in the setting of ILD, OSA and A-fib who had an aspiration event earlier today on BiPAP therapy continuously for greater than 24 hours.  Extremely anxious, tachypneic, and appears to be in mild distress.  Known high risk of intubation and reaffirmed the desire to be intubated if needed despite high likelihood of requiring tracheostomy/long-term care.  Extremely tachypneic but maintaining volumes   eICU Interventions  Ativan was effective earlier today at alleviating some of his symptoms, will repeat 2 mg dosing.  Obtain ABG  May need to initiate Precedex versus proceed with intubation if he remains distressed   2333 -adequate oxygenation and ventilation on ABG.  Patient appears much more comfortable off BiPAP, will trial nasal cannula and monitor mental status.  Intervention Category Minor Interventions: Agitation / anxiety - evaluation and management  [name] 07/20/2024, 10:16 PM

## 2024-07-20 NOTE — Evaluation (Signed)
 RT Evaluate and Treat Note  07/20/2024   Breathing is (select one): Unchanged   The following was found on auscultation (select multiple):  Bilateral Breath Sounds: Diminished (07/20/24 0746)  R Upper  Breath Sounds: Diminished (07/20/24 0746) L Upper Breath Sounds: Diminished (07/20/24 0746) R Lower Breath Sounds: Diminished (07/20/24 0746) L Lower Breath Sounds: Diminished (07/20/24 0746)    Cough Assessment: Cough: Productive;Strong (07/19/24 2000)    Most Recent Chest Xray:... (DG CHEST PORT 1 VIEW Result Date: 07/19/2024 EXAM: 1 VIEW XRAY OF THE CHEST 07/19/2024 08:55:00 PM COMPARISON: 07/19/2024 CLINICAL HISTORY: Chest pain. Respiratory distress. FINDINGS: LUNGS AND PLEURA: Bibasilar airspace opacities. Low lung volumes. HEART AND MEDIASTINUM: Stable cardiomegaly. BONES AND SOFT TISSUES: Bilateral shoulder arthroplasty noted. IMPRESSION: 1. Stable cardiomegaly. 2. Low lung volumes with similar bibasilar airspace opacities. Electronically signed by: Dorethia Molt MD 07/19/2024 09:20 PM EDT RP Workstation: [id number]      The following medications and/or interventions were ordered/changed/discontinued as part of the Respiratory Treatment protocol:   Medication Changes: None    Airway Clearance Changes: None   Oxygen Therapy Changes: None, still on BIPAP

## 2024-07-20 NOTE — Plan of Care (Signed)
  Problem: Education: Goal: Knowledge of General Education information will improve Description: Including pain rating scale, medication(s)/side effects and non-pharmacologic comfort measures Outcome: Progressing   Problem: Health Behavior/Discharge Planning: Goal: Ability to manage health-related needs will improve Outcome: Progressing   Problem: Clinical Measurements: Goal: Ability to maintain clinical measurements within normal limits will improve Outcome: Progressing Goal: Will remain free from infection Outcome: Progressing Goal: Diagnostic test results will improve Outcome: Progressing Goal: Respiratory complications will improve Outcome: Progressing Goal: Cardiovascular complication will be avoided Outcome: Progressing   Problem: Coping: Goal: Level of anxiety will decrease Outcome: Progressing   Problem: Nutrition: Goal: Adequate nutrition will be maintained Outcome: Progressing   Problem: Elimination: Goal: Will not experience complications related to bowel motility Outcome: Progressing Goal: Will not experience complications related to urinary retention Outcome: Progressing   Problem: Pain Managment: Goal: General experience of comfort will improve and/or be controlled Outcome: Progressing   Problem: Safety: Goal: Ability to remain free from injury will improve Outcome: Progressing

## 2024-07-20 NOTE — Progress Notes (Signed)
 NAME:  Nicholas [name], MRN:  [id number], DOB:  October 09, 1951, LOS: 1 ADMISSION DATE:  07/19/2024, CONSULTATION DATE:  07/19/24 REFERRING MD:  Levander , CHIEF COMPLAINT:  SOB    History of Present Illness:  73yo M PMG CVA, afib on eliquis , OSA, ILD w chronic hypoxia on 5L who presented to ED from facility 07/19/24 w ongoing SOB. Apparently started doxy for presumed PNA at facility 9/24, has not helped.  Went for CTA chest here which didn't show a central PE, but non diagnositic for segmental/sub segmental. Does have Diffuse GGO BLL  Was placed on BiPAP in ED for WOB/hypoxia, seemed to improve but when he was trialed off he desatted requiring prompt replacement. Rcvd rocephin  azithro   PCCM to admit in this setting   Compliant w eliquis . Progressive LLE edema. Persistent SOB, productive yellow/tan cough. Uses albuterol  3x/d. Abx at facility havent been helpful. Covid went around recently   Pertinent  Medical History  ILD Chronic hypoxia Afib Chronic AC OSA CVA   Significant Hospital Events: Including procedures, antibiotic start and stop dates in addition to other pertinent events   10/5 ED from facility for SOB admit to ICU on bipap  10/6 aspiration event noted this a.m. resulting and respiratory distress despite BiPAP.  Patient relayed to patient and myself that he does not want to be intubated  Interim History / Subjective:  In acute respiratory distress despite BiPAP  Objective    Blood pressure 127/68, pulse (!) 52, temperature 97.8 F (36.6 C), resp. rate 17, height 6' (1.829 m), weight 114.4 kg, SpO2 96%.    FiO2 (%):  [50 %-70 %] 70 % PEEP:  [10 cmH20] 10 cmH20 Pressure Support:  [10 cmH20] 10 cmH20   Intake/Output Summary (Last 24 hours) at 07/20/2024 0915 Last data filed at 07/20/2024 0600 Gross per 24 hour  Intake 172.37 ml  Output 1200 ml  Net -1027.63 ml   Filed Weights   07/19/24 0633 07/20/24 0500  Weight: 116.6 kg 114.4 kg    Examination: General: chronically  and acutely ill older adult M NAD  HENT: NCAT on bipap. Beard limiting Bipap seal.  Lungs: rhonchi, incr RR  Cardiovascular: cap refill < 3 sec  Abdomen: soft round  Extremities: L transmet amputation. LLE edema  Neuro: AAOx4  GU: defer  Resolved problem list   Assessment and Plan   Acute on chronic hypoxic resp failure -On nasal cannula at baseline ILD OSA Sepsis 2/2 Multifocal PNA Possible pulm edema  P: Continue BiPAP Trial of IV Ativan to assist in BiPAP compliance and agitation Strict n.p.o. Continue azithromycin , Zosyn and vancomycin  Diurese as needed Continue scheduled bronchodilators Patient states this a.m. to bedside nurse and myself that he would  NOT want to be intubated Son updated over the phone regarding change in intubation decision Continue high-dose steroids Follow cultures  Afib on Eliquis  P: Currently remains on home flecainide and Eliquis  but given aspiration event will need to consider IV options depending on clinical course  Elevated LA 2/2 sepsis  -Downtrending P: Continue to trend CBC and fever curve Medical management as above  LLE edema - Lower extremity Doppler negative for DVT P: Supportive care  Mild hyperglycemia P: Continue to trend on a.m. labs no indication for SSI at this moment  Goals of care A.m. 10/6 patient with aspiration event during oral a.m. medication ministration resulting in moderate respiratory distress despite BiPAP therapy.  Patient stated to nurse that he would NOT want to be intubated if  respiratory distress worsens.  I then communicated with patient regarding intubation and he again stated to me he does NOT want to be intubated he is completely alert and oriented and understands risk.  At this time we will trial dose of Ativan to assist with respiratory distress.  All called his son to discuss change in clinical status.    Critical care time:   CRITICAL CARE Performed by: Jerra Huckeby D. Harris   Total critical  care time: 42 minutes  Critical care time was exclusive of separately billable procedures and treating other patients.  Critical care was necessary to treat or prevent imminent or life-threatening deterioration.  Critical care was time spent personally by me on the following activities: development of treatment plan with patient and/or surrogate as well as nursing, discussions with consultants, evaluation of patient's response to treatment, examination of patient, obtaining history from patient or surrogate, ordering and performing treatments and interventions, ordering and review of laboratory studies, ordering and review of radiographic studies, pulse oximetry and re-evaluation of patient's condition.  Michale Weikel D. Harris, NP-C [facility] Personal contact information can be found on Amion  If no contact or response made please call [phone] 07/20/2024, 9:21 AM

## 2024-07-20 NOTE — IPAL (Signed)
  Interdisciplinary Goals of Care Family Meeting   Date carried out: 07/20/2024  Location of the meeting: Bedside  Member's involved: Nurse Practitioner, Bedside Registered Nurse, and Family Member or next of kin  Durable Power of Attorney or acting medical decision maker: Patient currently alert and oriented and able to make medical decisions however he is supported by his son and daughter.  Discussion: Earlier today during an acute respiratory distress event patient verbally stated to myself and bedside RN that he would not want to be intubated, at the time of that conversation patient was completely alert and oriented.  This afternoon patient has had more time to discuss options with his son and daughter and is no longer in acute respiratory distress.  After discussion patient has decided he WOULD want to be intubated but wants to continue his DNR status if his heart were to stop he would not want any chest compressions.  Son and daughter at bedside when this discussion was held with patient and understand decision made.  Will change CODE STATUS to DNR with periarrest interventions desired including intubation.  Code status:   Code Status: Do not attempt resuscitation (DNR) PRE-ARREST INTERVENTIONS DESIRED   Disposition: Continue current acute care  Time spent for the meeting: 35 mins  Sharaine Delange D. Harris, NP-C [facility] Personal contact information can be found on Amion  If no contact or response made please call [phone] 07/20/2024, 3:58 PM

## 2024-07-20 NOTE — Progress Notes (Signed)
 Brief PCCM progress note  Afternoon rounds reveal patient is still BiPAP dependent but is not in any acute respiratory distress.  At this time we will hold all oral medications including Eliquis  and flecainide.  Will add as needed IV beta-blocker if needed for tachycardia management overnight.  Consider initiation of heparin  drip in a.m. if patient continues to remain n.p.o..   Renn Dirocco D. Harris, NP-C [facility] Personal contact information can be found on Amion  If no contact or response made please call [phone] 07/20/2024, 5:57 PM

## 2024-07-21 ENCOUNTER — Ambulatory Visit (HOSPITAL_COMMUNITY)

## 2024-07-21 ENCOUNTER — Telehealth: Payer: Self-pay | Admitting: Cardiology

## 2024-07-21 DIAGNOSIS — E876 Hypokalemia: Secondary | ICD-10-CM

## 2024-07-21 LAB — BASIC METABOLIC PANEL WITH GFR
Anion gap: 13 (ref 5–15)
Anion gap: 16 — ABNORMAL HIGH (ref 5–15)
BUN: 27 mg/dL — ABNORMAL HIGH (ref 8–23)
BUN: 32 mg/dL — ABNORMAL HIGH (ref 8–23)
CO2: 26 mmol/L (ref 22–32)
CO2: 25 mmol/L (ref 22–32)
Calcium: 9.1 mg/dL (ref 8.9–10.3)
Calcium: 8.6 mg/dL — ABNORMAL LOW (ref 8.9–10.3)
Chloride: 102 mmol/L (ref 98–111)
Chloride: 102 mmol/L (ref 98–111)
Creatinine, Ser: 1.11 mg/dL (ref 0.61–1.24)
Creatinine, Ser: 1.39 mg/dL — ABNORMAL HIGH (ref 0.61–1.24)
GFR, Estimated: >60 mL/min (ref >60)
GFR, Estimated: 54 mL/min — ABNORMAL LOW (ref >60)
Glucose, Bld: 135 mg/dL — ABNORMAL HIGH (ref 70–99)
Glucose, Bld: 165 mg/dL — ABNORMAL HIGH (ref 70–99)
Potassium: 3.3 mmol/L — ABNORMAL LOW (ref 3.5–5.1)
Potassium: 3.7 mmol/L (ref 3.5–5.1)
Sodium: 141 mmol/L (ref 135–145)
Sodium: 143 mmol/L (ref 135–145)

## 2024-07-21 LAB — GLUCOSE, CAPILLARY
Glucose-Capillary: 113 mg/dL — ABNORMAL HIGH (ref 70–99)
Glucose-Capillary: 133 mg/dL — ABNORMAL HIGH (ref 70–99)
Glucose-Capillary: 134 mg/dL — ABNORMAL HIGH (ref 70–99)
Glucose-Capillary: 137 mg/dL — ABNORMAL HIGH (ref 70–99)
Glucose-Capillary: 142 mg/dL — ABNORMAL HIGH (ref 70–99)
Glucose-Capillary: 145 mg/dL — ABNORMAL HIGH (ref 70–99)

## 2024-07-21 LAB — MRSA NEXT GEN BY PCR, NASAL: MRSA by PCR Next Gen: NOT DETECTED

## 2024-07-21 LAB — STREP PNEUMONIAE URINARY ANTIGEN: Strep Pneumo Urinary Antigen: NEGATIVE

## 2024-07-21 MED ORDER — POTASSIUM CHLORIDE 10 MEQ/100ML IV SOLN
10.0000 meq | INTRAVENOUS | Status: AC
Start: 2024-07-21 — End: 2024-07-21
  Administered 2024-07-21 (×6): 10 meq via INTRAVENOUS
  Filled 2024-07-21: qty 100

## 2024-07-21 MED ORDER — POTASSIUM CHLORIDE 20 MEQ PO PACK
40.0000 meq | PACK | ORAL | Status: DC
Start: 2024-07-21 — End: 2024-07-21
  Filled 2024-07-21: qty 2

## 2024-07-21 MED ORDER — POTASSIUM CHLORIDE 10 MEQ/100ML IV SOLN: 10.0000 meq | INTRAVENOUS | Status: DC

## 2024-07-21 MED ORDER — PROPOFOL 1000 MG/100ML IV EMUL
INTRAVENOUS | Status: AC
Start: 2024-07-21 — End: 2024-07-21
  Filled 2024-07-21: qty 100

## 2024-07-21 MED ORDER — ENOXAPARIN SODIUM 120 MG/0.8ML IJ SOSY
120.0000 mg | PREFILLED_SYRINGE | Freq: Two times a day (BID) | INTRAMUSCULAR | Status: DC
  Administered 2024-07-21 – 2024-07-22 (×4): 120 mg via SUBCUTANEOUS
  Filled 2024-07-21 (×5): qty 0.8

## 2024-07-21 MED ORDER — POTASSIUM CHLORIDE 10 MEQ/100ML IV SOLN
10.0000 meq | INTRAVENOUS | Status: AC
  Administered 2024-07-21 – 2024-07-22 (×4): 10 meq via INTRAVENOUS
  Filled 2024-07-21 (×4): qty 100

## 2024-07-21 MED ORDER — PROPOFOL 1000 MG/100ML IV EMUL
INTRAVENOUS | Status: AC
  Filled 2024-07-21: qty 100

## 2024-07-21 MED ORDER — SEVOFLURANE IN SOLN
RESPIRATORY_TRACT | Status: AC
  Filled 2024-07-21: qty 250

## 2024-07-21 MED ORDER — METHYLPREDNISOLONE SODIUM SUCC 40 MG IJ SOLR
40.0000 mg | Freq: Two times a day (BID) | INTRAMUSCULAR | Status: DC
  Administered 2024-07-22 (×2): 40 mg via INTRAVENOUS
  Filled 2024-07-21 (×2): qty 1

## 2024-07-21 MED ORDER — POTASSIUM CHLORIDE 10 MEQ/100ML IV SOLN
10.0000 meq | INTRAVENOUS | Status: AC
  Filled 2024-07-21: qty 100

## 2024-07-21 MED ORDER — VALPROATE SODIUM 100 MG/ML IV SOLN
500.0000 mg | INTRAVENOUS | Status: DC
  Administered 2024-07-21 – 2024-07-22 (×2): 500 mg via INTRAVENOUS
  Filled 2024-07-21 (×3): qty 5

## 2024-07-21 MED ORDER — METHYLPREDNISOLONE SODIUM SUCC 500 MG IJ SOLR
500.0000 mg | INTRAMUSCULAR | Status: AC
  Administered 2024-07-21: 500 mg via INTRAVENOUS
  Filled 2024-07-21: qty 500

## 2024-07-21 MED ORDER — FUROSEMIDE 10 MG/ML IJ SOLN
40.0000 mg | Freq: Once | INTRAMUSCULAR | Status: AC
  Administered 2024-07-21: 40 mg via INTRAVENOUS
  Filled 2024-07-21: qty 4

## 2024-07-21 NOTE — Evaluation (Signed)
 RT Evaluate and Treat Note  07/21/2024   Breathing is (select one): Same as normal    The following was found on auscultation (select multiple):  Bilateral Breath Sounds: Diminished;Clear (07/21/24 0754)  R Upper  Breath Sounds: Diminished (07/20/24 0911) L Upper Breath Sounds: Diminished (07/20/24 0911) R Lower Breath Sounds: Diminished (07/20/24 0911) L Lower Breath Sounds: Diminished (07/20/24 0911)    Cough Assessment: Cough: Strong;Productive (07/20/24 2313)    Most Recent Chest Xray:... (No results found.    The following medications and/or interventions were ordered/changed/discontinued as part of the Respiratory Treatment protocol:   Medication Changes: none    Airway Clearance Changes: none    Oxygen Therapy Changes:High Flow Changed to 8L, off bipap now

## 2024-07-21 NOTE — Progress Notes (Signed)
 Pharmacy Electrolyte Replacement  Recent Labs:  Recent Labs    07/21/24 2037  K 3.7  CREATININE 1.39*    Low Critical Values (K </= 2.5, Phos </= 1, Mg </= 1) Present: None  MD Contacted: N/A  K 3.7 s/p Lasix . Prior to Lasix  and 6 x 10 mEq potassium runs K 3.3.   Plan:  Kcl 10 meQ IV x 4   Massie Fila, PharmD Clinical Pharmacist  07/21/2024 9:43 PM ;

## 2024-07-21 NOTE — Progress Notes (Addendum)
 Pharmacy Electrolyte Replacement  Recent Labs:  Recent Labs    07/21/24 0646  K 3.3*  CREATININE 1.11    Low Critical Values (K </= 2.5, Phos </= 1, Mg </= 1) Present: None  MD Contacted: N/A  Plan: Kcl 10 mEq IV x 6 through PIV Recheck labs in AM Addendum: patient receiving Lasix , discussed with Dr. Meade. Will change to Kcl 40 mEq PO q4 x 2 doses + 2 x Kcl 10 mEq PIV.   Administrator, arts Student Pharmacist  07/21/2024,8:19 AM

## 2024-07-21 NOTE — Evaluation (Addendum)
 Clinical/Bedside Swallow Evaluation Patient Details  Name: Nicholas [name] MRN: [id number] Date of Birth: [birth year]  Today's Date: 07/21/2024 Time: SLP Start Time (ACUTE ONLY): 1442 SLP Stop Time (ACUTE ONLY): 1500 SLP Time Calculation (min) (ACUTE ONLY): 18 min  Past Medical History:  Past Medical History:  Diagnosis Date   Acute kidney failure 08/2008   cleared up  no problems since   Acute meningitis 11/06/2022   Allergy    Anxiety    Arthritis    Asthma    ?of this no inhaler   Atrial fibrillation (HCC)    Blood transfusion    CHF (congestive heart failure) (HCC)    ?of this, pt denies   CLOSTRIDIUM DIFFICILE COLITIS 07/04/2010   Annotation: 12/09, 2/10 Qualifier: Diagnosis of  By: [name], [name]     COVID-19 08/12/22 completed Paxlovid  08/20/22 08/26/2022   Depression with anxiety    Duodenitis determined by biopsy 02/2016   peptic likely due to aleve (erosive gastropathy with duodenal erosions)   ECZEMA 07/04/2010   Qualifier: Diagnosis of  By: [name], [name]     Elevated liver enzymes    Fatty liver    Full dentures    GERD (gastroesophageal reflux disease)    Heart attack (HCC)    08/2008 (likely demand ischemia in the setting of MSSA sepsis/R TKA  infection)10-2008   History of hiatal hernia    History of stomach ulcers    Hypothyroidism    Lower GI bleeding    MALAR AND MAXILLARY BONES CLOSED FRACTURE 07/04/2010   Annotation: ORIF Qualifier: Diagnosis of  By: [name], [name]     Migraine    definitely   MRSA (methicillin resistant Staphylococcus aureus)    in leg, had to place steel rod in leg   Personal history of colonic adenoma 06/01/2003   PFO (patent foramen ovale)    small PFO by 08/2008 TEE   Pneumonia 08/2008   while in ICU   Pneumonia due to COVID-19 virus 08/12/2022   Seizures (HCC) 2009   long time ago     Sleep apnea    does not wear CPAP   Stroke (HCC) 2010   unable to complete sentences at times   Wears glasses    Past  Surgical History:  Past Surgical History:  Procedure Laterality Date   AMPUTATION Left 01/24/2022   Procedure: LEFT GREAT TOE AMPUTATION AT METATARSOPHALANGEAL JOINT AND SECOND TOE AMPUTATION;  Surgeon: Harden Jerona GAILS, MD;  Location: [facility] OR;  Service: Orthopedics;  Laterality: Left;   anterior  nerve transposition  07/2009   left ulnar nerve   antibiotic spacer exchange  11/2008; 08/2006   right knee   ARTHROTOMY  08/2008   right knee w/I&D   CARDIOVASCULAR STRESS TEST  2013   stress EKG - negative for ischemia, 4 min 7.3 METs, normal blood pressure response   CARDIOVERSION N/A 08/24/2022   Procedure: CARDIOVERSION;  Surgeon: Perla Evalene PARAS, MD;  Location: [facility] ORS;  Service: Cardiovascular;  Laterality: N/A;   CATARACT EXTRACTION     COLONOSCOPY  11/2012   diverticulosis, rpt 10 yrs Ollen)   ESOPHAGOGASTRODUODENOSCOPY  02/2016   erosive gastropathy with duodenal erosions Ollen)   HARDWARE REMOVAL  04/2006   right knee w/antibiotic spacers placed   INGUINAL HERNIA REPAIR  early 1990's   bilateral   JOINT REPLACEMENT Bilateral    KNEE ARTHROSCOPY  10/2001   right   KNEE FUSION  03/2009   right knee removal; antibiotic spacers;  LEFT HEART CATH AND CORONARY ANGIOGRAPHY N/A 02/04/2018    WNL (Patwardhan, Newman PARAS, MD)   LUMBAR LAMINECTOMY/DECOMPRESSION MICRODISCECTOMY N/A 12/05/2018   Procedure: LUMBAR THREE TO FOUR AND LUMBAR FOUR TO FIVE DECOMPRESSION;  Surgeon: Barbarann Oneil BROCKS, MD;  Location: [facility] OR;  Service: Orthopedics;  Laterality: N/A;   MULTIPLE TOOTH EXTRACTIONS     REPLACEMENT TOTAL KNEE  08/2008; 09/2001   right   REVERSE SHOULDER ARTHROPLASTY Left 09/03/2017   Procedure: REVERSE SHOULDER ARTHROPLASTY;  Surgeon: Addie Cordella Hamilton, MD;  Location: [facility] OR;  Service: Orthopedics;  Laterality: Left;   REVERSE SHOULDER ARTHROPLASTY Right 05/13/2018   Procedure: RIGHT REVERSE SHOULDER ARTHROPLASTY;  Surgeon: Addie Cordella Hamilton, MD;  Location: [facility] OR;  Service: Orthopedics;   Laterality: Right;   rod placement Right 03/2009   R knee   SYNOVECTOMY  06/2005   debridement, liner exchange right knee   TEE WITHOUT CARDIOVERSION N/A 08/24/2022   Procedure: TRANSESOPHAGEAL ECHOCARDIOGRAM (TEE);  Surgeon: Perla Evalene PARAS, MD;  Location: [facility] ORS;  Service: Cardiovascular;  Laterality: N/A;   TOE AMPUTATION Left 08/2008   great toe - osteomyelitis (staph infection)   TOE AMPUTATION Left 04/2016   2nd toe Billi)   TONSILLECTOMY     TOTAL KNEE ARTHROPLASTY Left 01/15/2017   TOTAL KNEE ARTHROPLASTY Left 01/15/2017   Procedure: TOTAL KNEE ARTHROPLASTY;  Surgeon: Hamilton Cordella Addie, MD;  Location: [facility] OR;  Service: Orthopedics;  Laterality: Left;   HPI:  73 yo male presenting to ED 10/5 with ongoing shortness of breath. Started abx for presumed pneumonia at facility 9/24 without improvement. CTA shows diffuse ground-glass opacities with scattered areas of interlobular septal thickening most confluent in the bilateral lower lobes. Initially required BiPAP. Per chart review, pt has not had difficulty swallowing since remote history of severe dysphagia with MBS performed in 2009 and 2012 (unable to access report) but per MD note 02/14/2011, pt returned to regular diet once dentures became available. PMH includes prior CVA, A-fib on Eliquis , OSA, ILD with chronic hypoxia on 5L    Assessment / Plan / Recommendation  Clinical Impression  Pt reports increased frequency of dysphagia and coughing with PO intake, although he states this typically happens with solids and is preceded by globus sensation. He is edentulous but wears dentures to eat regular solids but they are currently unavailable. Pt limiting sips to very small volumes, though coughing still intermittently followed. He reported increasing globus sensation once offered purees, which when followed with liquids resulted in prolonged bouts of forceful coughing. His clinical presentation is thought to be affected by multiple factors  including a potential esophageal component, acute respiratory status, and reports of dysphagia PTA. Will proceed with an MBS for further assessment. Pending results, recommend he remain NPO except meds crushed in puree and 2-3 ice chips after oral care for comfort. Will f/u. SLP Visit Diagnosis: Dysphagia, unspecified (R13.10)    Aspiration Risk  Mild aspiration risk    Diet Recommendation NPO except meds;Ice chips PRN after oral care    Medication Administration: Crushed with puree    Other  Recommendations Oral Care Recommendations: Oral care QID;Oral care prior to ice chip/H20     Assistance Recommended at Discharge    Functional Status Assessment    Frequency and Duration            Prognosis Prognosis for improved oropharyngeal function: Good Barriers to Reach Goals: Time post onset      Swallow Study   General HPI: 73 yo male  presenting to ED 10/5 with ongoing shortness of breath. Started abx for presumed pneumonia at facility 9/24 without improvement. CTA shows diffuse ground-glass opacities with scattered areas of interlobular septal thickening most confluent in the bilateral lower lobes. Initially required BiPAP. Per chart review, pt has not had difficulty swallowing since remote history of severe dysphagia with MBS performed in 2009 and 2012 (unable to access report) but per MD note 02/14/2011, pt returned to regular diet once dentures became available. PMH includes prior CVA, A-fib on Eliquis , OSA, ILD with chronic hypoxia on 5L Type of Study: Bedside Swallow Evaluation Previous Swallow Assessment: see HPI Diet Prior to this Study: NPO Temperature Spikes Noted: No Respiratory Status: Nasal cannula;Increased respiratory rate History of Recent Intubation: No Behavior/Cognition: Alert;Cooperative;Pleasant mood Oral Cavity Assessment: Within Functional Limits Oral Care Completed by SLP: No Oral Cavity - Dentition: Edentulous;Dentures, not available Vision: Functional for  self-feeding Self-Feeding Abilities: Able to feed self;Needs set up Patient Positioning: Upright in bed Baseline Vocal Quality: Normal Volitional Cough: Strong Volitional Swallow: Able to elicit    Oral/Motor/Sensory Function Overall Oral Motor/Sensory Function: Within functional limits   Ice Chips Ice chips: Not tested   Thin Liquid Thin Liquid: Impaired Presentation: Straw Pharyngeal  Phase Impairments: Cough - Immediate    Nectar Thick Nectar Thick Liquid: Not tested   Honey Thick Honey Thick Liquid: Not tested   Puree Puree: Within functional limits   Solid     Solid: Not tested      Damien Blumenthal, M.A., CCC-SLP Speech Language Pathology, Acute Rehabilitation Services  Secure Chat preferred [phone]  07/21/2024,3:33 PM

## 2024-07-21 NOTE — Telephone Encounter (Signed)
 Caller Isa) stated patient is currently in hospital [facility]) and wants to know if patient will have procedure scheduled for Thursday rescheduled to be while patient is in hospital.

## 2024-07-21 NOTE — Progress Notes (Signed)
 NAME:  Nicholas [name], MRN:  [id number], DOB:  [birth year], LOS: 2 ADMISSION DATE:  07/19/2024, CONSULTATION DATE:  07/19/24 REFERRING MD:  Levander , CHIEF COMPLAINT:  SOB    History of Present Illness:  73yo M PMG CVA, afib on eliquis , OSA, ILD w chronic hypoxia on 5L who presented to ED from facility 07/19/24 w ongoing SOB. Apparently started doxy for presumed PNA at facility 9/24, has not helped.  Went for CTA chest here which didn't show a central PE, but non diagnositic for segmental/sub segmental. Does have Diffuse GGO BLL  Was placed on BiPAP in ED for WOB/hypoxia, seemed to improve but when he was trialed off he desatted requiring prompt replacement. Rcvd rocephin  azithro   PCCM to admit in this setting   Compliant w eliquis . Progressive LLE edema. Persistent SOB, productive yellow/tan cough. Uses albuterol  3x/d. Abx at facility havent been helpful. Covid went around recently   Pertinent  Medical History  ILD Chronic hypoxia Afib Chronic AC OSA CVA   Significant Hospital Events: Including procedures, antibiotic start and stop dates in addition to other pertinent events   10/5 ED from facility for SOB admit to ICU on bipap  10/6 aspiration event noted this a.m. resulting and respiratory distress despite BiPAP.  Patient relayed to patient and myself that he does not want to be intubated  Interim History / Subjective:  Tolerating HFNC. Son at bedside.  Objective    Blood pressure 125/77, pulse 73, temperature 98.8 F (37.1 C), resp. rate (!) 29, height 6' (1.829 m), weight 115 kg, SpO2 96%.    FiO2 (%):  [70 %] 70 % PEEP:  [10 cmH20] 10 cmH20 Pressure Support:  [10 cmH20] 10 cmH20   Intake/Output Summary (Last 24 hours) at 07/21/2024 0851 Last data filed at 07/21/2024 0700 Gross per 24 hour  Intake 1604.64 ml  Output 500 ml  Net 1104.64 ml   Filed Weights   07/19/24 0633 07/20/24 0500 07/21/24 0412  Weight: 116.6 kg 114.4 kg 115 kg    Examination: Elderly man on  HFNC Normal breathing, no wheeze but bibasilar crackles RRR No peripheral edema Left foot partial amputation, left ankle disfigured  BNP 315 CGBs controlled Hypokalemia  Resolved problem list   Assessment and Plan   Acute on chronic hypoxic resp failure -On nasal cannula at baseline ILD OSA Sepsis 2/2 Multifocal PNA Possible pulm edema  P: Continue HFNC Bedside swallow Continue azithromycin , Zosyn will stop vancomycin  given negative mrsa pcr. Diurese as needed Continue scheduled bronchodilators Currently on 1g daily steroids. Last day today with plans to transition to 0.50- 1mg /kg daily which means 40 mg bid.  Follow cultures He is on 5LNC at home so will try to   Afib on Eliquis  P: Currently remains on home flecainide and Eliquis  but given aspiration event will need to consider IV options depending on clinical course. Should be able to give oral meds today.  LLE edema - Lower extremity Doppler negative for DVT P: Supportive care Diuresis today in the setting of elevated BNP  Mild hyperglycemia P: Continue to trend on a.m. labs no indication for SSI at this moment  Hypokalemia - IV and oral supplementation   Goals of care DNR but ok with trial of intubation see documentation from 10/6 Son updated at bedside  Potential transfer to step down today pending clinical course. Pulmonary will continue to follow.   I spent 50 minutes in total visit time for this patient, with more than 50% spent counseling/coordinating  care.  Verdon Gore, MD Pulmonary and Critical Care Medicine [facility] 07/21/2024 9:20 AM Pager: see AMION  If no response to pager, please call critical care on call (see AMION) until 7pm After 7:00 pm call Elink

## 2024-07-21 NOTE — Progress Notes (Addendum)
 PHARMACY - ANTICOAGULATION CONSULT NOTE  Pharmacy Consult for LMWH Indication: atrial fibrillation  Allergies  Allergen Reactions   Peanut-Containing Drug Products Anaphylaxis and Dermatitis   Yellow Jacket Venom [Bee Venom] Anaphylaxis and Other (See Comments)    Respiratory Distress   Celebrex [Celecoxib] Other (See Comments)    BLACK STOOL?MELENA?   Percocet [Oxycodone -Acetaminophen ] Hives, Swelling and Other (See Comments)   Latex Rash   Nsaids Rash    Patient Measurements: Height: 6' (182.9 cm) Weight: 115 kg (253 lb 8.5 oz) IBW/kg (Calculated) : 77.6  Vital Signs: Temp: 98.8 F (37.1 C) (10/07 0743) Temp Source: Oral (10/07 0346) BP: 132/69 (10/07 0900) Pulse Rate: 83 (10/07 0900)  Labs: Recent Labs    07/19/24 0627 07/19/24 9357 07/19/24 0643 07/20/24 0351 07/20/24 2249 07/21/24 0646  HGB 12.9* 13.3 13.3 11.7* 12.2*  --   HCT 40.7 39.0 39.0 36.6* 36.0*  --   PLT 216  --   --  200  --   --   LABPROT 20.0*  --   --   --   --   --   INR 1.6*  --   --   --   --   --   CREATININE 1.25* 1.20  --  1.17  --  1.11  TROPONINIHS 10  --   --   --   --   --     Estimated Creatinine Clearance: 77.6 mL/min (by C-G formula based on SCr of 1.11 mg/dL).   Medical History: Past Medical History:  Diagnosis Date   Acute kidney failure 08/2008   cleared up  no problems since   Acute meningitis 11/06/2022   Allergy    Anxiety    Arthritis    Asthma    ?of this no inhaler   Atrial fibrillation (HCC)    Blood transfusion    CHF (congestive heart failure) (HCC)    ?of this, pt denies   CLOSTRIDIUM DIFFICILE COLITIS 07/04/2010   Annotation: 12/09, 2/10 Qualifier: Diagnosis of  By: [name], [name]     COVID-19 08/12/22 completed Paxlovid  08/20/22 08/26/2022   Depression with anxiety    Duodenitis determined by biopsy 02/2016   peptic likely due to aleve (erosive gastropathy with duodenal erosions)   ECZEMA 07/04/2010   Qualifier: Diagnosis of  By: [name],  [name]     Elevated liver enzymes    Fatty liver    Full dentures    GERD (gastroesophageal reflux disease)    Heart attack (HCC)    08/2008 (likely demand ischemia in the setting of MSSA sepsis/R TKA  infection)10-2008   History of hiatal hernia    History of stomach ulcers    Hypothyroidism    Lower GI bleeding    MALAR AND MAXILLARY BONES CLOSED FRACTURE 07/04/2010   Annotation: ORIF Qualifier: Diagnosis of  By: [name], [name]     Migraine    definitely   MRSA (methicillin resistant Staphylococcus aureus)    in leg, had to place steel rod in leg   Personal history of colonic adenoma 06/01/2003   PFO (patent foramen ovale)    small PFO by 08/2008 TEE   Pneumonia 08/2008   while in ICU   Pneumonia due to COVID-19 virus 08/12/2022   Seizures (HCC) 2009   long time ago     Sleep apnea    does not wear CPAP   Stroke (HCC) 2010   unable to complete sentences at times   Wears glasses  Medications:  Scheduled:   apixaban   5 mg Oral BID   arformoterol  15 mcg Nebulization BID   budesonide (PULMICORT) nebulizer solution  0.5 mg Nebulization BID   Chlorhexidine  Gluconate Cloth  6 each Topical Daily   flecainide  100 mg Oral BID   insulin aspart  0-20 Units Subcutaneous Q4H   [START ON 07/22/2024] methylPREDNISolone  (SOLU-MEDROL ) injection  40 mg Intravenous BID   mouth rinse  15 mL Mouth Rinse 4 times per day   pantoprazole  (PROTONIX ) IV  40 mg Intravenous Q24H   potassium chloride   40 mEq Oral Q4H   Infusions:   azithromycin  500 mg (07/21/24 0944)   methylPREDNISolone  (SOLU-MEDROL ) injection     piperacillin-tazobactam (ZOSYN)  IV 12.5 mL/hr at 07/21/24 0800   potassium chloride      valproate sodium     PRN: docusate sodium , ipratropium-albuterol , mouth rinse, polyethylene glycol  Assessment: Patient with history of Afib, on Eliquis  PTA. Last dose Eliquis  07/18/24. Patient failed bedside swallow 10/7 AM, was unable to take Eliquis  this admission. Will transition  to Lovenox  until patient is able to take meds PO. CBC stable, no bleeding observed or reported.   Goal of Therapy:  Anti-Xa level 0.6-1 units/ml 4hrs after LMWH dose given if appropriate.  Monitor platelets by anticoagulation protocol: Yes   Plan:  Start Lovenox  120 mg SQ q12h. Continue to monitor H&H and platelets   F/u speech evaluation and ability to transition back to PO therapy.  Administrator, arts Student Pharmacist  07/21/2024,11:49 AM

## 2024-07-22 ENCOUNTER — Inpatient Hospital Stay (HOSPITAL_COMMUNITY)

## 2024-07-22 DIAGNOSIS — J9621 Acute and chronic respiratory failure with hypoxia: Secondary | ICD-10-CM | POA: Diagnosis not present

## 2024-07-22 LAB — MAGNESIUM: Magnesium: 2.2 mg/dL (ref 1.7–2.4)

## 2024-07-22 LAB — GLUCOSE, CAPILLARY
Glucose-Capillary: 113 mg/dL — ABNORMAL HIGH (ref 70–99)
Glucose-Capillary: 119 mg/dL — ABNORMAL HIGH (ref 70–99)
Glucose-Capillary: 124 mg/dL — ABNORMAL HIGH (ref 70–99)
Glucose-Capillary: 127 mg/dL — ABNORMAL HIGH (ref 70–99)
Glucose-Capillary: 142 mg/dL — ABNORMAL HIGH (ref 70–99)
Glucose-Capillary: 151 mg/dL — ABNORMAL HIGH (ref 70–99)

## 2024-07-22 LAB — LEGIONELLA PNEUMOPHILA SEROGP 1 UR AG: L. pneumophila Serogp 1 Ur Ag: NEGATIVE

## 2024-07-22 LAB — RENAL FUNCTION PANEL
Albumin: 2.7 g/dL — ABNORMAL LOW (ref 3.5–5.0)
Anion gap: 11 (ref 5–15)
BUN: 34 mg/dL — ABNORMAL HIGH (ref 8–23)
CO2: 28 mmol/L (ref 22–32)
Calcium: 8.6 mg/dL — ABNORMAL LOW (ref 8.9–10.3)
Chloride: 102 mmol/L (ref 98–111)
Creatinine, Ser: 1.2 mg/dL (ref 0.61–1.24)
GFR, Estimated: >60 mL/min (ref >60)
Glucose, Bld: 127 mg/dL — ABNORMAL HIGH (ref 70–99)
Phosphorus: 3.4 mg/dL (ref 2.5–4.6)
Potassium: 3.7 mmol/L (ref 3.5–5.1)
Sodium: 141 mmol/L (ref 135–145)

## 2024-07-22 LAB — BRAIN NATRIURETIC PEPTIDE: B Natriuretic Peptide: 362.2 pg/mL — ABNORMAL HIGH (ref 0.0–100.0)

## 2024-07-22 MED ORDER — HYDROMORPHONE HCL 1 MG/ML IJ SOLN
0.2000 mg | Freq: Once | INTRAMUSCULAR | Status: AC
  Administered 2024-07-22: 0.2 mg via INTRAVENOUS
  Filled 2024-07-22: qty 0.5

## 2024-07-22 MED ORDER — HYDRALAZINE HCL 20 MG/ML IJ SOLN: 10.0000 mg | INTRAMUSCULAR | Status: DC | PRN

## 2024-07-22 MED ORDER — GUAIFENESIN 100 MG/5ML PO LIQD: 5.0000 mL | ORAL | Status: DC | PRN

## 2024-07-22 MED ORDER — FENTANYL CITRATE PF 50 MCG/ML IJ SOSY
25.0000 ug | PREFILLED_SYRINGE | INTRAMUSCULAR | Status: DC | PRN
  Administered 2024-07-22 – 2024-07-23 (×4): 25 ug via INTRAVENOUS
  Filled 2024-07-22 (×4): qty 1

## 2024-07-22 MED ORDER — ONDANSETRON HCL 4 MG/2ML IJ SOLN: 4.0000 mg | Freq: Four times a day (QID) | INTRAMUSCULAR | Status: DC | PRN

## 2024-07-22 MED ORDER — METOPROLOL TARTRATE 5 MG/5ML IV SOLN: 5.0000 mg | INTRAVENOUS | Status: DC | PRN

## 2024-07-22 MED ORDER — LORAZEPAM 2 MG/ML IJ SOLN
1.0000 mg | INTRAMUSCULAR | Status: DC | PRN
  Administered 2024-07-22 – 2024-07-23 (×8): 1 mg via INTRAVENOUS
  Filled 2024-07-22 (×9): qty 1

## 2024-07-22 MED ORDER — LEVALBUTEROL HCL 1.25 MG/0.5ML IN NEBU
1.2500 mg | INHALATION_SOLUTION | Freq: Three times a day (TID) | RESPIRATORY_TRACT | Status: DC
  Administered 2024-07-22: 1.25 mg via RESPIRATORY_TRACT
  Filled 2024-07-22 (×2): qty 0.5

## 2024-07-22 MED ORDER — TRAZODONE HCL 50 MG PO TABS: 50.0000 mg | ORAL_TABLET | Freq: Every evening | ORAL | Status: DC | PRN

## 2024-07-22 MED ORDER — FUROSEMIDE 10 MG/ML IJ SOLN
40.0000 mg | Freq: Once | INTRAMUSCULAR | Status: AC
  Administered 2024-07-22: 40 mg via INTRAVENOUS
  Filled 2024-07-22: qty 4

## 2024-07-22 MED ORDER — LEVALBUTEROL HCL 1.25 MG/0.5ML IN NEBU: 1.2500 mg | INHALATION_SOLUTION | Freq: Three times a day (TID) | RESPIRATORY_TRACT | Status: DC | PRN

## 2024-07-22 NOTE — Care Plan (Signed)
 Around 0500 this patient was found to have an SpO2 of 86-87% on 8L HHFNC. The patient did not complain of respiratory distress however his respirations were greater than 20 and he was noted to be more confused, making statements that did not make a lot of sense. The RT was notified who came to unit, assessed pt, and increased oxygen to 12 L HHFNC. The patient was maintaining adequate oxygenation on this until around 0630 when the patient started desatting to around 81-86%. At this point this writer increased HHFNC to 14L. However, the patient's SpO2 would not maintain above 88% and pt was remaining drowsy although he was able to state his name, birthday, location, date, and describe the situation.. At this time decided to place pt on a nonrebreather mask and was able to get pt's SpO2 back up in the 90s. RT notified to come and place patient on the BiPAP

## 2024-07-22 NOTE — Progress Notes (Addendum)
 PROGRESS NOTE    Nicholas [name]  FMW:[id number] DOB: 1951-04-24 DOA: 07/19/2024 PCP: [name], MD    Brief Narrative:   73 year old with history of CVA, A-fib on Eliquis , OSA, ILD with chronic hypoxia 5 L nasal cannula came to the ED from facility on 10/5 with ongoing shortness of breath.  Apparently started on doxycycline  for presumed pneumonia at the facility on 9/24 without any help.  CTA chest was negative for PE but showed nondiagnostic diffuse ground glass opacity bilaterally.  In the ER required BiPAP and received Rocephin  and azithromycin .  There was concerns of aspiration noted on 10/6 and patient did not want to be intubated.  Now remains off BiPAP on high flow  Assessment & Plan:  Acute on chronic hypoxic respiratory failure Diffuse ground glass opacity/interstitial lung disease Sepsis secondary to multifocal pneumonia -At home 5 L nasal cannula on and briefly required BiPAP and was admitted to the ICU.  This morning on nonrebreather with occasional episodes of hypoxia.  Lasix  40 mg IV.  -CTA chest negative for PE but shows diffuse ground glass opacity - Complete course of Rocephin  and Zosyn - Bronchodilators, I-S/flutter valve - On steroids -N.p.o., speech and swallow evaluation = Mild Asp risk. Plans for MBS which will be delayed due to respiratory status Case discussed with Dr Meade from Pulm.   Paroxysmal atrial fibrillation - On flecainide, and therapeutic dose of Lovenox .  Will resume Eliquis  when appropriate  Left lower extremity edema - DVT has been ruled out  History of CVA - Eliquis , statin  Chronic migraines - On valproate  Will resume home meds once cleared by S&S Consult palliative care  I had goals of care discussion with the patient.  He agrees that patient does not want intubation, chest compression and any form of feeding tube placement at this time.   DVT prophylaxis: Lovenox     Code Status: Do not attempt resuscitation (DNR) PRE-ARREST  INTERVENTIONS DESIRED Family Communication:  Florence Sieving, no answer.  Status is: Inpatient Remains inpatient appropriate because: Ongoing severe respiratory issues   PT Follow up Recs:   Subjective: Patient noted to be hypoxic this morning requiring nonrebreather.  Later cortrak was attempted but patient did not tolerate this well.  Patient is clear that he wants to be DNR/DNI and does not want any advance feeding tube including cortrak.  He agrees that his condition is critical   Examination:  General exam: Appears calm and comfortable ; on NRB Respiratory system: b/l diffuse rhonchi Cardiovascular system: S1 & S2 heard, RRR. No JVD, murmurs, rubs, gallops or clicks. No pedal edema. Gastrointestinal system: Abdomen is nondistended, soft and nontender. No organomegaly or masses felt. Normal bowel sounds heard. Central nervous system: Alert and oriented. No focal neurological deficits. Extremities: Symmetric 5 x 5 power. Skin: No rashes, lesions or ulcers Psychiatry: Judgement and insight appear normal. Mood & affect appropriate.                Diet Orders (From admission, onward)     Start     Ordered   07/20/24 1751  Diet NPO time specified  Diet effective now        07/20/24 1750            Objective: Vitals:   07/22/24 0711 07/22/24 0725 07/22/24 0930 07/22/24 1100  BP:  132/80  130/75  Pulse:  76 75 76  Resp:  (!) 22 (!) 38 (!) 34  Temp:  97.9 F (36.6 C)  98.4 F (36.9 C)  TempSrc:  Oral  Oral  SpO2:  (!) 81% 91% (!) 88%  Weight: 114.6 kg     Height:        Intake/Output Summary (Last 24 hours) at 07/22/2024 1328 Last data filed at 07/21/2024 1900 Gross per 24 hour  Intake 577.18 ml  Output 250 ml  Net 327.18 ml   Filed Weights   07/20/24 0500 07/21/24 0412 07/22/24 0711  Weight: 114.4 kg 115 kg 114.6 kg    Scheduled Meds:  arformoterol  15 mcg Nebulization BID   budesonide (PULMICORT) nebulizer solution  0.5 mg Nebulization BID    Chlorhexidine  Gluconate Cloth  6 each Topical Daily   enoxaparin  (LOVENOX ) injection  120 mg Subcutaneous Q12H   flecainide  100 mg Oral BID   insulin aspart  0-20 Units Subcutaneous Q4H   methylPREDNISolone  (SOLU-MEDROL ) injection  40 mg Intravenous BID   mouth rinse  15 mL Mouth Rinse 4 times per day   pantoprazole  (PROTONIX ) IV  40 mg Intravenous Q24H   Continuous Infusions:  azithromycin  500 mg (07/22/24 1100)   piperacillin-tazobactam (ZOSYN)  IV 3.375 g (07/22/24 0431)   valproate sodium 500 mg (07/21/24 2059)    Nutritional status     Body mass index is 34.27 kg/m.  Data Reviewed:   CBC: Recent Labs  Lab 07/19/24 0627 07/19/24 9357 07/19/24 0643 07/20/24 0351 07/20/24 2249  WBC 13.8*  --   --  7.8  --   NEUTROABS 10.3*  --   --   --   --   HGB 12.9* 13.3 13.3 11.7* 12.2*  HCT 40.7 39.0 39.0 36.6* 36.0*  MCV 95.3  --   --  94.3  --   PLT 216  --   --  200  --    Basic Metabolic Panel: Recent Labs  Lab 07/19/24 0627 07/19/24 0642 07/19/24 0643 07/20/24 0351 07/20/24 2249 07/21/24 0646 07/21/24 2037 07/22/24 0341  NA 140 140   < > 139 141 141 143 141  K 3.8 3.8   < > 3.6 3.6 3.3* 3.7 3.7  CL 97* 99  --  99  --  102 102 102  CO2 28  --   --  27  --  26 25 28   GLUCOSE 133* 135*  --  167*  --  135* 165* 127*  BUN 20 22  --  21  --  27* 32* 34*  CREATININE 1.25* 1.20  --  1.17  --  1.11 1.39* 1.20  CALCIUM  9.1  --   --  8.8*  --  9.1 8.6* 8.6*  MG  --   --   --   --   --   --   --  2.2  PHOS  --   --   --   --   --   --   --  3.4   < > = values in this interval not displayed.   GFR: Estimated Creatinine Clearance: 71.7 mL/min (by C-G formula based on SCr of 1.2 mg/dL). Liver Function Tests: Recent Labs  Lab 07/19/24 0627 07/22/24 0341  AST 25  --   ALT 10  --   ALKPHOS 69  --   BILITOT 3.7*  --   PROT 7.8  --   ALBUMIN  3.3* 2.7*   No results for input(s): LIPASE, AMYLASE in the last 168 hours. No results for input(s): AMMONIA in the  last 168 hours. Coagulation Profile: Recent Labs  Lab 07/19/24  9372  INR 1.6*   Cardiac Enzymes: No results for input(s): CKTOTAL, CKMB, CKMBINDEX, TROPONINI in the last 168 hours. BNP (last 3 results) No results for input(s): PROBNP in the last 8760 hours. HbA1C: Recent Labs    07/20/24 0351  HGBA1C 5.7*   CBG: Recent Labs  Lab 07/21/24 1512 07/21/24 2020 07/22/24 0019 07/22/24 0339 07/22/24 1127  GLUCAP 113* 142* 142* 127* 124*   Lipid Profile: No results for input(s): CHOL, HDL, LDLCALC, TRIG, CHOLHDL, LDLDIRECT in the last 72 hours. Thyroid  Function Tests: No results for input(s): TSH, T4TOTAL, FREET4, T3FREE, THYROIDAB in the last 72 hours. Anemia Panel: No results for input(s): VITAMINB12, FOLATE, FERRITIN, TIBC, IRON , RETICCTPCT in the last 72 hours. Sepsis Labs: Recent Labs  Lab 07/19/24 9361 07/19/24 0831 07/19/24 1738  PROCALCITON  --   --  0.20  LATICACIDVEN 3.0* 2.5*  --     Recent Results (from the past 240 hours)  Blood culture (routine x 2)     Status: None (Preliminary result)   Collection Time: 07/19/24  6:25 AM   Specimen: BLOOD RIGHT ARM  Result Value Ref Range Status   Specimen Description BLOOD RIGHT ARM  Final   Special Requests   Final    BOTTLES DRAWN AEROBIC AND ANAEROBIC Blood Culture adequate volume   Culture   Final    NO GROWTH 3 DAYS Performed at [facility], [address], [place], [place] [address]    Report Status PENDING  Incomplete  Blood culture (routine x 2)     Status: None (Preliminary result)   Collection Time: 07/19/24  6:30 AM   Specimen: BLOOD LEFT ARM  Result Value Ref Range Status   Specimen Description BLOOD LEFT ARM  Final   Special Requests   Final    BOTTLES DRAWN AEROBIC AND ANAEROBIC Blood Culture adequate volume   Culture   Final    NO GROWTH 3 DAYS Performed at [facility], [address] [place], [place] [address]    Report Status PENDING   Incomplete  Respiratory (~20 pathogens) panel by PCR     Status: None   Collection Time: 07/19/24 11:45 AM   Specimen: Nasopharyngeal Swab; Respiratory  Result Value Ref Range Status   Adenovirus NOT DETECTED NOT DETECTED Final   Coronavirus 229E NOT DETECTED NOT DETECTED Final    Comment: (NOTE) The Coronavirus on the Respiratory Panel, DOES NOT test for the novel  Coronavirus (2019 nCoV)    Coronavirus HKU1 NOT DETECTED NOT DETECTED Final   Coronavirus NL63 NOT DETECTED NOT DETECTED Final   Coronavirus OC43 NOT DETECTED NOT DETECTED Final   Metapneumovirus NOT DETECTED NOT DETECTED Final   Rhinovirus / Enterovirus NOT DETECTED NOT DETECTED Final   Influenza A NOT DETECTED NOT DETECTED Final   Influenza B NOT DETECTED NOT DETECTED Final   Parainfluenza Virus 1 NOT DETECTED NOT DETECTED Final   Parainfluenza Virus 2 NOT DETECTED NOT DETECTED Final   Parainfluenza Virus 3 NOT DETECTED NOT DETECTED Final   Parainfluenza Virus 4 NOT DETECTED NOT DETECTED Final   Respiratory Syncytial Virus NOT DETECTED NOT DETECTED Final   Bordetella pertussis NOT DETECTED NOT DETECTED Final   Bordetella Parapertussis NOT DETECTED NOT DETECTED Final   Chlamydophila pneumoniae NOT DETECTED NOT DETECTED Final   Mycoplasma pneumoniae NOT DETECTED NOT DETECTED Final    Comment: Performed at [facility], [address], [place], [place] [address]  SARS Coronavirus 2 by RT PCR (hospital order,  performed in [facility] hospital lab) *cepheid single result test* Nasopharyngeal Swab     Status: None   Collection Time: 07/19/24 11:45 AM   Specimen: Nasopharyngeal Swab; Nasal Swab  Result Value Ref Range Status   SARS Coronavirus 2 by RT PCR NEGATIVE NEGATIVE Final    Comment: Performed at [facility], [address], [place], [place] [address]  MRSA Next Gen by PCR, Nasal     Status: None   Collection Time: 07/21/24 12:00 AM   Specimen: Nasal Mucosa; Nasal Swab  Result Value Ref Range Status    MRSA by PCR Next Gen NOT DETECTED NOT DETECTED Final    Comment: (NOTE) The GeneXpert MRSA Assay (FDA approved for NASAL specimens only), is one component of a comprehensive MRSA colonization surveillance program. It is not intended to diagnose MRSA infection nor to guide or monitor treatment for MRSA infections. Test performance is not FDA approved in patients less than 8 years old. Performed at [facility], [address] [place], [place] [address]          Radiology Studies: No results found.         LOS: 3 days   Time spent= 35 mins    Burgess JAYSON Dare, MD [facility]  If 7PM-7AM, please contact night-coverage  07/22/2024, 1:28 PM

## 2024-07-22 NOTE — Progress Notes (Signed)
   NAME:  Nicholas [name], MRN:  [id number], DOB:  1951-03-30, LOS: 3 ADMISSION DATE:  07/19/2024, CONSULTATION DATE:  07/19/24 REFERRING MD:  Levander , CHIEF COMPLAINT:  SOB    History of Present Illness:  73yo M PMG CVA, afib on eliquis , OSA, ILD w chronic hypoxia on 5L who presented to ED from facility 07/19/24 w ongoing SOB. Apparently started doxy for presumed PNA at facility 9/24, has not helped.  Went for CTA chest here which didn't show a central PE, but non diagnositic for segmental/sub segmental. Does have Diffuse GGO BLL  Was placed on BiPAP in ED for WOB/hypoxia, seemed to improve but when he was trialed off he desatted requiring prompt replacement. Rcvd rocephin  azithro   PCCM to admit in this setting   Compliant w eliquis . Progressive LLE edema. Persistent SOB, productive yellow/tan cough. Uses albuterol  3x/d. Abx at facility havent been helpful. Covid went around recently   Pertinent  Medical History  ILD Chronic hypoxia Afib Chronic AC OSA CVA   Significant Hospital Events: Including procedures, antibiotic start and stop dates in addition to other pertinent events   10/5 ED from facility for SOB admit to ICU on bipap  10/6 aspiration event noted this a.m. resulting and respiratory distress despite BiPAP.  Patient relayed to patient and myself that he does not want to be intubated  Interim History / Subjective:  Had episode of desaturation this morning. Currently getting breathing treatment on non-rebreather. Agreeable to cortrak placement today if needed.  Objective    Blood pressure 132/80, pulse 76, temperature 97.9 F (36.6 C), temperature source Oral, resp. rate (!) 22, height 6' (1.829 m), weight 114.6 kg, SpO2 (!) 81%.        Intake/Output Summary (Last 24 hours) at 07/22/2024 0908 Last data filed at 07/21/2024 1900 Gross per 24 hour  Intake 868.07 ml  Output 1450 ml  Net -581.93 ml   Filed Weights   07/20/24 0500 07/21/24 0412 07/22/24 0711  Weight: 114.4 kg  115 kg 114.6 kg    Examination: Elderly man on venti mask No wheeze, Bibasilar crackles RRR No lower extremity edema  BNP 315 CGBs controlled Hypokalemia  Resolved problem list   Assessment and Plan   Acute on chronic hypoxic resp failure -On nasal cannula 5L at baseline ILD Probable aspiration OSA Sepsis 2/2 Multifocal PNA Possible pulm edema  P: Try to taper back down to HFNC if possible, but RT says he is a mouth breather and does better with mask than [place].  Plan is for MBS with SLP today. If SLP thinks his respiratory status is not optimal enough for going down for procedure, may need to proceed with cortrak. He tells he me has been aspirating at home so that may be contributing substantially to respiratory status.  Continue abx Continue scheduled bronchodilators Continue steroids Follow cultures He is on 5LNC at home so will try to wean him down to this overtime.   Afib on Eliquis  P: Currently remains on home flecainide - hopefully he can either pass swallow or get cortrak today  Goals of care DNR but ok with trial of intubation see documentation from 10/6 [facility] with cortrak if needed.   Rest per TRH. Discussed with Dr. Caleen.   Verdon Gore, MD Pulmonary and Critical Care Medicine [facility] 07/22/2024 9:08 AM Pager: see AMION  If no response to pager, please call critical care on call (see AMION) until 7pm After 7:00 pm call Elink

## 2024-07-22 NOTE — Progress Notes (Signed)
 SLP Cancellation Note  Patient Details Name: Nicholas [name] MRN: [id number] DOB: May 02, 1951   Cancelled treatment:       Reason Eval/Treat Not Completed: Medical issues which prohibited therapy, increased O2 requirements today (15L via NRB). Discussed with MD, who asks that SLP hold further assessment until pt's respiratory status is more stable. Will continue following.    Damien Blumenthal, M.A., CCC-SLP Speech Language Pathology, Acute Rehabilitation Services  Secure Chat preferred [phone]  07/22/2024, 12:34 PM

## 2024-07-22 NOTE — Hospital Course (Addendum)
 Brief Narrative:   73 year old with history of CVA, A-fib on Eliquis , OSA, ILD with chronic hypoxia 5 L nasal cannula came to the ED from facility on 10/5 with ongoing shortness of breath.  Apparently started on doxycycline  for presumed pneumonia at the facility on 9/24 without any help.  CTA chest was negative for PE but showed nondiagnostic diffuse ground glass opacity bilaterally.  In the ER required BiPAP and received Rocephin  and azithromycin .  There was concerns of aspiration noted on 10/6 and patient did not want to be intubated.  Now remains off BiPAP on high flow. Worsening condition therefore transitioning him to comfort care. Was on morphine  drip, eventually passed on 08-01-2024  Assessment & Plan:  Acute on chronic hypoxic respiratory failure Diffuse ground glass opacity/interstitial lung disease Sepsis secondary to multifocal pneumonia Paroxysmal atrial fibrillation History of CVA Chronic migraine Recurrent aspirations   Was on morphine  drip, eventually passed on 2024-08-01  Anticipate in-hospital death  DVT prophylaxis: Comfort care patient DNR/DNI   PT Follow up Recs:   Subjective: Seen at bedside,.  Resting comfortably.  On morphine  drip. Patient's son and 2 sisters are at bedside as well.  Examination:  General exam: Somnolent Respiratory system: b/l diffuse rhonchi Cardiovascular system: S1 & S2 heard, RRR. No JVD, murmurs, rubs, gallops or clicks. No pedal edema. Gastrointestinal system: Abdomen is nondistended, soft and nontender. No organomegaly or masses felt. Normal bowel sounds heard. Central nervous system: Lethargic Extremities: No evidence of swelling Skin: No rashes, lesions or ulcers Psychiatry: Able to assess

## 2024-07-22 NOTE — Progress Notes (Signed)
 Patient seen and examined at bedside again this afternoon due to worsening hypoxia and desaturation.  Unfortunately despite of aggressive steroids, broad-spectrum antibiotics, steroids and supportive care, is condition continues to slowly decline.  Due to worsening in respiratory status he continues to become anxious with further worsens his condition. Patient is very clear that he would like to remain DNR/DNI.  He is also considering transitioning to comfort care.  I met with patient's son [name] at bedside.  I clarified and answered all the questions.  He understands that patients recurrent aspiration has led to worsening of his interstitial lung disease.  I clearly explained to him and patient that at this time patient is in critical condition with irreversible condition and recommendations are to transition him to comfort care.  Son would like to discuss this with the patient and patient's sister who will arrive later at bedside.  They would like to have family discussion amongst themselves before making final decision.  He does agree that patient has DNR/DNI.  RN updated as well.   Palliative consult was placed earlier.   Burgess Dare MD TR[facility]

## 2024-07-22 NOTE — Progress Notes (Signed)
 Cortrak Tube Team Note:  Consult received to place a Cortrak feeding tube.   Cortrak placement attempted but during placement patient adamantly refused to go any further so procedure was aborted. Patient remains adamant he does not want to attempt again. MD, RN, and RD notified.   Trude Ned RD, LDN Contact via [employer].

## 2024-07-22 NOTE — Progress Notes (Signed)
 Called to patient bedside for worsening sats and increased O2.  Patient is now on 100% NRB with 15 L HFNC in addition to it with sats 86-89%. Patient's WOB has increased.  Notified Dr Caleen. Attempted to try and talk with patient about BIPAP placement but he is saying he does not want it. I left at bedside in case patient changes mind.

## 2024-07-22 NOTE — Progress Notes (Signed)
 Rt called due to pt desatting. Pt placed on 12L HFNC. Will continue to monitor.

## 2024-07-22 NOTE — Progress Notes (Signed)
 Pt placed on NRB, does not want to use Bipap. RN was at bedside and is aware. Vitals as noted

## 2024-07-22 NOTE — Evaluation (Signed)
 RT Evaluate and Treat Note  07/22/2024   Breathing is (select one): Worse than normal   The following was found on auscultation (select multiple):  Bilateral Breath Sounds: Clear;Diminished (07/22/24 1100)  R Upper  Breath Sounds: Clear (07/22/24 1100) L Upper Breath Sounds: Clear (07/22/24 1100) R Lower Breath Sounds: Clear;Diminished (07/22/24 1100) L Lower Breath Sounds: Clear;Diminished (07/22/24 1100)    Cough Assessment: Cough: Non-productive;Strong (07/22/24 1100)    Most Recent Chest Xray:... (No results found.    The following medications and/or interventions were ordered/changed/discontinued as part of the Respiratory Treatment protocol:   Medication Changes: Albuterol  Changed  Airway Clearance Changes: Cough & Deep Breath  Changed   Oxygen Therapy Changes:Non-Rebreathing Mask Changed

## 2024-07-22 NOTE — Telephone Encounter (Signed)
 Per Dr. Gollan, ok to cancel cardioversion scheduled for tomorrow 07/23/24 due to pt currently being admitted and in NSR.   Cardioversion cancelled as requested

## 2024-07-22 NOTE — Progress Notes (Addendum)
 Notified by RN of respiratory distress. On NRB and Palo with RR in the 40s. He was placed on BiPAP. I evaluated at bedside, he is somnolent but alert when awake. Bilateral rales. He is not able to further discuss code status and goals at this time. I spoke with his son [name] and sister Jenkins at the bedside. We discussed his worsening respiratory failure despite treatment, and impression that appears he is nearing or at the end of life from this problem. Without NIV support I anticipate his prognosis would be in realm of days if not shorter. We discussed adding Fentanyl  which is indicated for his work of breathing, and I saw him after a dose and he had improved symptomatically after this. Right now family is not ready to decide on comfort care, and would like to discuss further in the morning. I have changed code status to include DNI per his wishes. They agree with continuing current management with BiPAP and meds for symptomatic relief overnight. I advised them to call additional family who would like to visit (has a daughter in [place] ). Son had asked about feeding tube, but I have advised against this due to his short prognosis from his respiratory failure.   Dorn Dawson, MD  [facility]

## 2024-07-22 NOTE — Progress Notes (Addendum)
 Called to bedside for worsening hypoxemia and desaturation.  Per nursing and RT patient is really struggling to breathe.  Over the last 3 days he has not had meaningful improvement despite IV steroids, broad-spectrum antibiotics.  He is now on high flow nasal cannula as well as a nonrebreather.  His saturations are 88 to 90%.  He is fearful and is asking for medication for pain and anxiety.  He does not want to undergo chest compressions or to be put on a ventilator.  He really wants to be comfortable.  He was asking me about medication to give him to help him die.  I have changed his CODE STATUS to full DNR and given him some Ativan as needed to help with anxiety and panic related to dyspnea.  Eh is asking for chaplain to come to bedside. Anticipate he will need to transition to full comfort measures today with him trying to get in touch with the son [name].  I left a message asking for callback.  At this point I do not think ICU transfer would be appropriate and I think he should transition to comfort measures.  Additional time 30 minutes in addition to my 30 minutes already performed.   Nicholas Gore, MD Pulmonary and Critical Care Medicine [facility] 07/22/2024 2:14 PM Pager: see AMION  If no response to pager, please call critical care on call (see AMION) until 7pm After 7:00 pm call Elink

## 2024-07-22 NOTE — Progress Notes (Signed)
 Nutrition Brief Note  Patient on Cortrak list today given ongoing NPO status and increased hypoxia. Patient unfortunately did not tolerate cortrak placement well and declines further attempt at placement.   Chart reviewed and patient continues to remain uncomfortable with ongoing worsening of hypoxemia and desaturation. CCM MD assessed patient who is requesting to be comfortable and would not desire intubation.   Noted anticipation for transition to full comfort measures likely later today.   No nutrition interventions to put into place today.  RD will continue to monitor code status and address nutrition interventions as appropriate.   Allie Daveda Larock, RDN, LDN Clinical Nutrition See AMiON for contact information.

## 2024-07-23 ENCOUNTER — Encounter: Admission: RE | Payer: Self-pay

## 2024-07-23 ENCOUNTER — Ambulatory Visit: Admission: RE | Admit: 2024-07-23 | Source: Home / Self Care | Admitting: Cardiovascular Disease

## 2024-07-23 DIAGNOSIS — Z7189 Other specified counseling: Secondary | ICD-10-CM | POA: Diagnosis not present

## 2024-07-23 DIAGNOSIS — Z01818 Encounter for other preprocedural examination: Secondary | ICD-10-CM

## 2024-07-23 DIAGNOSIS — J9621 Acute and chronic respiratory failure with hypoxia: Secondary | ICD-10-CM | POA: Diagnosis not present

## 2024-07-23 DIAGNOSIS — Z515 Encounter for palliative care: Secondary | ICD-10-CM | POA: Diagnosis not present

## 2024-07-23 DIAGNOSIS — Z66 Do not resuscitate: Secondary | ICD-10-CM

## 2024-07-23 LAB — CBC
HCT: 39 % (ref 39.0–52.0)
Hemoglobin: 12.7 g/dL — ABNORMAL LOW (ref 13.0–17.0)
MCH: 30.5 pg (ref 26.0–34.0)
MCHC: 32.6 g/dL (ref 30.0–36.0)
MCV: 93.8 fL (ref 80.0–100.0)
Platelets: 237 K/uL (ref 150–400)
RBC: 4.16 MIL/uL — ABNORMAL LOW (ref 4.22–5.81)
RDW: 14 % (ref 11.5–15.5)
WBC: 13.3 K/uL — ABNORMAL HIGH (ref 4.0–10.5)
nRBC: 0 % (ref 0.0–0.2)

## 2024-07-23 LAB — BASIC METABOLIC PANEL WITH GFR
Anion gap: 13 (ref 5–15)
BUN: 39 mg/dL — ABNORMAL HIGH (ref 8–23)
CO2: 29 mmol/L (ref 22–32)
Calcium: 8.6 mg/dL — ABNORMAL LOW (ref 8.9–10.3)
Chloride: 103 mmol/L (ref 98–111)
Creatinine, Ser: 1.18 mg/dL (ref 0.61–1.24)
GFR, Estimated: >60 mL/min (ref >60)
Glucose, Bld: 145 mg/dL — ABNORMAL HIGH (ref 70–99)
Potassium: 3.6 mmol/L (ref 3.5–5.1)
Sodium: 145 mmol/L (ref 135–145)

## 2024-07-23 LAB — GLUCOSE, CAPILLARY
Glucose-Capillary: 128 mg/dL — ABNORMAL HIGH (ref 70–99)
Glucose-Capillary: 115 mg/dL — ABNORMAL HIGH (ref 70–99)

## 2024-07-23 LAB — MAGNESIUM: Magnesium: 2.4 mg/dL (ref 1.7–2.4)

## 2024-07-23 SURGERY — CARDIOVERSION
Anesthesia: General

## 2024-07-23 MED ORDER — HALOPERIDOL LACTATE 2 MG/ML PO CONC: 0.5000 mg | ORAL | Status: DC | PRN

## 2024-07-23 MED ORDER — MORPHINE 100MG IN NS 100ML (1MG/ML) PREMIX INFUSION
3.0000 mg/h | INTRAVENOUS | Status: DC
  Administered 2024-07-23: 1 mg/h via INTRAVENOUS
  Filled 2024-07-23: qty 100

## 2024-07-23 MED ORDER — HALOPERIDOL LACTATE 5 MG/ML IJ SOLN: 0.5000 mg | INTRAMUSCULAR | Status: DC | PRN

## 2024-07-23 MED ORDER — BIOTENE DRY MOUTH MT LIQD: 15.0000 mL | OROMUCOSAL | Status: DC | PRN

## 2024-07-23 MED ORDER — FUROSEMIDE 10 MG/ML IJ SOLN: 40.0000 mg | Freq: Two times a day (BID) | INTRAMUSCULAR | Status: DC

## 2024-07-23 MED ORDER — MORPHINE BOLUS VIA INFUSION: 5.0000 mg | INTRAVENOUS | Status: DC | PRN

## 2024-07-23 MED ORDER — LORAZEPAM 2 MG/ML IJ SOLN
2.0000 mg | INTRAMUSCULAR | Status: DC | PRN
  Administered 2024-07-23 – 2024-07-24 (×4): 2 mg via INTRAVENOUS
  Filled 2024-07-23 (×3): qty 1

## 2024-07-23 MED ORDER — GLYCOPYRROLATE 0.2 MG/ML IJ SOLN
0.1000 mg | Freq: Four times a day (QID) | INTRAMUSCULAR | Status: DC | PRN
  Filled 2024-07-23: qty 0.5

## 2024-07-23 MED ORDER — MORPHINE BOLUS VIA INFUSION: 2.0000 mg | INTRAVENOUS | Status: DC | PRN

## 2024-07-23 MED ORDER — MORPHINE 100MG IN NS 100ML (1MG/ML) PREMIX INFUSION
1.0000 mg/h | INTRAVENOUS | Status: DC
  Administered 2024-07-23: 13 mg/h via INTRAVENOUS
  Administered 2024-07-23: 5 mg/h via INTRAVENOUS
  Administered 2024-07-24: 13 mg/h via INTRAVENOUS
  Filled 2024-07-23 (×2): qty 100

## 2024-07-23 MED ORDER — LORAZEPAM 2 MG/ML IJ SOLN
2.0000 mg | INTRAMUSCULAR | Status: AC
  Administered 2024-07-23: 2 mg via INTRAVENOUS
  Filled 2024-07-23: qty 1

## 2024-07-23 MED ORDER — MORPHINE BOLUS VIA INFUSION
5.0000 mg | INTRAVENOUS | Status: DC | PRN
  Administered 2024-07-24: 5 mg via INTRAVENOUS

## 2024-07-23 MED ORDER — MORPHINE BOLUS VIA INFUSION: 1.0000 mg | INTRAVENOUS | Status: DC | PRN

## 2024-07-23 MED ORDER — FENTANYL CITRATE (PF) 50 MCG/ML IJ SOSY: 25.0000 ug | PREFILLED_SYRINGE | INTRAMUSCULAR | Status: DC | PRN

## 2024-07-23 MED ORDER — POLYVINYL ALCOHOL 1.4 % OP SOLN: 1.0000 [drp] | Freq: Four times a day (QID) | OPHTHALMIC | Status: DC | PRN

## 2024-07-23 MED ORDER — HALOPERIDOL 0.5 MG PO TABS: 0.5000 mg | ORAL_TABLET | ORAL | Status: DC | PRN

## 2024-07-23 NOTE — Progress Notes (Signed)
 When going to patient's room Nicholas [name], Palliative NP asked me to give patient Ativan as he is on comfort care and we were taking him off Bipap. I pulled the Ativan out of the Pyxis under the 1mg  order and on the way to the patient's room Nicholas Barnacle, NP stated that she had placed an order for 2mg  Ativan and wanted me to give that dose instead from the vial that I had pulled. While in the room I administered the entire dose from the vial and documented that I administered 2mg  from the vial. The Pyxis showed that I had undocumented waste since I pulled the Ativan under the 1mg  order. I called pharmacy and explained what had happened and the pharmacist advised me to place a note in the chart and they would correct in the Pyxis. I also made the charge nurses, Nicholas [name] and Nicholas [name] aware of what happened.

## 2024-07-23 NOTE — Progress Notes (Addendum)
 PROGRESS NOTE    Nicholas [name]  FMW:[id number] DOB: November 22, 1950 DOA: 07/19/2024 PCP: [name], MD    Brief Narrative:   73 year old with history of CVA, A-fib on Eliquis , OSA, ILD with chronic hypoxia 5 L nasal cannula came to the ED from facility on 10/5 with ongoing shortness of breath.  Apparently started on doxycycline  for presumed pneumonia at the facility on 9/24 without any help.  CTA chest was negative for PE but showed nondiagnostic diffuse ground glass opacity bilaterally.  In the ER required BiPAP and received Rocephin  and azithromycin .  There was concerns of aspiration noted on 10/6 and patient did not want to be intubated.  Now remains off BiPAP on high flow. Worsening condition therefore transitioning him to comfort care  Assessment & Plan:  Acute on chronic hypoxic respiratory failure Diffuse ground glass opacity/interstitial lung disease Sepsis secondary to multifocal pneumonia Unfortunately worsening respiratory status despite of aggressive efforts.  Patient has been clear he does not want to be aggressive therefore we will start slowly transitioning to comfort care.  For now continue BiPAP until his family arrives but will be starting morphine  drip to keep him comfortable.  In the meantime we will discontinue any routine medications.  Paroxysmal atrial fibrillation - Discontinue routine medications  Left lower extremity edema - DVT has been ruled out  History of CVA - DC routine meds  Chronic migraines - DC meds  I had extensive goals of care discussion with the patient and his son yesterday.  They are having hard time coming to a conclusion but they clearly understand that patient's condition is critical and very likely a reversible.  Pulmonary team had similar discussions as well.  Our recommendations are to make him comfort care.  Palliative care team consulted  Prognosis is hours to days  DVT prophylaxis: Lovenox     Code Status: Do not attempt  resuscitation (DNR) PRE-ARREST INTERVENTIONS DESIRED Family Communication: Met with son at bedside Status is: Inpatient Remains inpatient appropriate because: Ongoing severe respiratory issues   PT Follow up Recs:   Subjective: Worsening respiratory status now requiring BiPAP.  Son is at bedside and understands patient is he is nearing end-of-life.  Examination:  General exam: Comfortable on BiPAP, on morphine  drip Respiratory system: b/l diffuse rhonchi Cardiovascular system: S1 & S2 heard, RRR. No JVD, murmurs, rubs, gallops or clicks. No pedal edema. Gastrointestinal system: Abdomen is nondistended, soft and nontender. No organomegaly or masses felt. Normal bowel sounds heard. Central nervous system: Lethargic Extremities: No evidence of swelling Skin: No rashes, lesions or ulcers Psychiatry: Able to assess                Diet Orders (From admission, onward)     Start     Ordered   07/20/24 1751  Diet NPO time specified  Diet effective now        07/20/24 1750            Objective: Vitals:   07/23/24 0800 07/23/24 0823 07/23/24 0857 07/23/24 0900  BP: 132/71   (!) 123/51  Pulse: 82 (!) 53 89 96  Resp: (!) 22 (!) 37 (!) 50 (!) 24  Temp: 98.2 F (36.8 C)     TempSrc: Axillary     SpO2: 96% 98% 93% 94%  Weight:      Height:        Intake/Output Summary (Last 24 hours) at 07/23/2024 1144 Last data filed at 07/23/2024 0607 Gross per 24 hour  Intake --  Output 2350 ml  Net -2350 ml   Filed Weights   07/21/24 0412 07/22/24 0711 07/23/24 0400  Weight: 115 kg 114.6 kg 112.1 kg    Scheduled Meds:  arformoterol  15 mcg Nebulization BID   budesonide (PULMICORT) nebulizer solution  0.5 mg Nebulization BID   Chlorhexidine  Gluconate Cloth  6 each Topical Daily   furosemide   40 mg Intravenous BID   insulin aspart  0-20 Units Subcutaneous Q4H   mouth rinse  15 mL Mouth Rinse 4 times per day   Continuous Infusions:  morphine       Nutritional status      Body mass index is 33.52 kg/m.  Data Reviewed:   CBC: Recent Labs  Lab 07/19/24 0627 07/19/24 9357 07/19/24 0643 07/20/24 0351 07/20/24 2249 07/23/24 0327  WBC 13.8*  --   --  7.8  --  13.3*  NEUTROABS 10.3*  --   --   --   --   --   HGB 12.9* 13.3 13.3 11.7* 12.2* 12.7*  HCT 40.7 39.0 39.0 36.6* 36.0* 39.0  MCV 95.3  --   --  94.3  --  93.8  PLT 216  --   --  200  --  237   Basic Metabolic Panel: Recent Labs  Lab 07/20/24 0351 07/20/24 2249 07/21/24 0646 07/21/24 2037 07/22/24 0341 07/23/24 0327  NA 139 141 141 143 141 145  K 3.6 3.6 3.3* 3.7 3.7 3.6  CL 99  --  102 102 102 103  CO2 27  --  26 25 28 29   GLUCOSE 167*  --  135* 165* 127* 145*  BUN 21  --  27* 32* 34* 39*  CREATININE 1.17  --  1.11 1.39* 1.20 1.18  CALCIUM  8.8*  --  9.1 8.6* 8.6* 8.6*  MG  --   --   --   --  2.2 2.4  PHOS  --   --   --   --  3.4  --    GFR: Estimated Creatinine Clearance: 72.1 mL/min (by C-G formula based on SCr of 1.18 mg/dL). Liver Function Tests: Recent Labs  Lab 07/19/24 0627 07/22/24 0341  AST 25  --   ALT 10  --   ALKPHOS 69  --   BILITOT 3.7*  --   PROT 7.8  --   ALBUMIN  3.3* 2.7*   No results for input(s): LIPASE, AMYLASE in the last 168 hours. No results for input(s): AMMONIA in the last 168 hours. Coagulation Profile: Recent Labs  Lab 07/19/24 0627  INR 1.6*   Cardiac Enzymes: No results for input(s): CKTOTAL, CKMB, CKMBINDEX, TROPONINI in the last 168 hours. BNP (last 3 results) No results for input(s): PROBNP in the last 8760 hours. HbA1C: No results for input(s): HGBA1C in the last 72 hours. CBG: Recent Labs  Lab 07/22/24 1622 07/22/24 2028 07/22/24 2334 07/23/24 0415 07/23/24 0835  GLUCAP 113* 119* 151* 128* 115*   Lipid Profile: No results for input(s): CHOL, HDL, LDLCALC, TRIG, CHOLHDL, LDLDIRECT in the last 72 hours. Thyroid  Function Tests: No results for input(s): TSH, T4TOTAL, FREET4, T3FREE,  THYROIDAB in the last 72 hours. Anemia Panel: No results for input(s): VITAMINB12, FOLATE, FERRITIN, TIBC, IRON , RETICCTPCT in the last 72 hours. Sepsis Labs: Recent Labs  Lab 07/19/24 9361 07/19/24 0831 07/19/24 1738  PROCALCITON  --   --  0.20  LATICACIDVEN 3.0* 2.5*  --     Recent Results (from the past 240 hours)  Blood culture (routine x  2)     Status: None (Preliminary result)   Collection Time: 07/19/24  6:25 AM   Specimen: BLOOD RIGHT ARM  Result Value Ref Range Status   Specimen Description BLOOD RIGHT ARM  Final   Special Requests   Final    BOTTLES DRAWN AEROBIC AND ANAEROBIC Blood Culture adequate volume   Culture   Final    NO GROWTH 4 DAYS Performed at [facility], [address] [place], [place] [address]    Report Status PENDING  Incomplete  Blood culture (routine x 2)     Status: None (Preliminary result)   Collection Time: 07/19/24  6:30 AM   Specimen: BLOOD LEFT ARM  Result Value Ref Range Status   Specimen Description BLOOD LEFT ARM  Final   Special Requests   Final    BOTTLES DRAWN AEROBIC AND ANAEROBIC Blood Culture adequate volume   Culture   Final    NO GROWTH 4 DAYS Performed at [facility], [address] [place], [place] [address]    Report Status PENDING  Incomplete  Respiratory (~20 pathogens) panel by PCR     Status: None   Collection Time: 07/19/24 11:45 AM   Specimen: Nasopharyngeal Swab; Respiratory  Result Value Ref Range Status   Adenovirus NOT DETECTED NOT DETECTED Final   Coronavirus 229E NOT DETECTED NOT DETECTED Final    Comment: (NOTE) The Coronavirus on the Respiratory Panel, DOES NOT test for the novel  Coronavirus (2019 nCoV)    Coronavirus HKU1 NOT DETECTED NOT DETECTED Final   Coronavirus NL63 NOT DETECTED NOT DETECTED Final   Coronavirus OC43 NOT DETECTED NOT DETECTED Final   Metapneumovirus NOT DETECTED NOT DETECTED Final   Rhinovirus / Enterovirus NOT DETECTED NOT DETECTED Final    Influenza A NOT DETECTED NOT DETECTED Final   Influenza B NOT DETECTED NOT DETECTED Final   Parainfluenza Virus 1 NOT DETECTED NOT DETECTED Final   Parainfluenza Virus 2 NOT DETECTED NOT DETECTED Final   Parainfluenza Virus 3 NOT DETECTED NOT DETECTED Final   Parainfluenza Virus 4 NOT DETECTED NOT DETECTED Final   Respiratory Syncytial Virus NOT DETECTED NOT DETECTED Final   Bordetella pertussis NOT DETECTED NOT DETECTED Final   Bordetella Parapertussis NOT DETECTED NOT DETECTED Final   Chlamydophila pneumoniae NOT DETECTED NOT DETECTED Final   Mycoplasma pneumoniae NOT DETECTED NOT DETECTED Final    Comment: Performed at [facility], [address], [place], [place] [address]  SARS Coronavirus 2 by RT PCR (hospital order, performed in [facility] hospital lab) *cepheid single result test* Nasopharyngeal Swab     Status: None   Collection Time: 07/19/24 11:45 AM   Specimen: Nasopharyngeal Swab; Nasal Swab  Result Value Ref Range Status   SARS Coronavirus 2 by RT PCR NEGATIVE NEGATIVE Final    Comment: Performed at [facility], [address], [place], [place] [address]  MRSA Next Gen by PCR, Nasal     Status: None   Collection Time: 07/21/24 12:00 AM   Specimen: Nasal Mucosa; Nasal Swab  Result Value Ref Range Status   MRSA by PCR Next Gen NOT DETECTED NOT DETECTED Final    Comment: (NOTE) The GeneXpert MRSA Assay (FDA approved for NASAL specimens only), is one component of a comprehensive MRSA colonization surveillance program. It is not intended to diagnose MRSA infection nor to guide or monitor treatment for MRSA infections. Test performance is not FDA approved in patients less than 39 years old.  Performed at [facility], [address], [place], [place] [address]          Radiology Studies: No results found.         LOS: 4 days   Time spent= 35 mins    Burgess JAYSON Dare, MD [facility]  If 7PM-7AM, please contact  night-coverage  07/23/2024, 11:44 AM

## 2024-07-23 NOTE — Progress Notes (Signed)
 NAME:  Nicholas [name], MRN:  [id number], DOB:  01-20-1951, LOS: 4 ADMISSION DATE:  07/19/2024, CONSULTATION DATE:  07/19/24 REFERRING MD:  Nicholas [name] , CHIEF COMPLAINT:  SOB    History of Present Illness:  73yo M PMG CVA, afib on eliquis , OSA, ILD w chronic hypoxia on 5L who presented to ED from facility 07/19/24 w ongoing SOB. Apparently started doxy for presumed PNA at facility 9/24, has not helped.  Went for CTA chest here which didn't show a central PE, but non diagnositic for segmental/sub segmental. Does have Diffuse GGO BLL  Was placed on BiPAP in ED for WOB/hypoxia, seemed to improve but when he was trialed off he desatted requiring prompt replacement. Rcvd rocephin  azithro   PCCM to admit in this setting   Compliant w eliquis . Progressive LLE edema. Persistent SOB, productive yellow/tan cough. Uses albuterol  3x/d. Abx at facility havent been helpful. Covid went around recently   Pertinent  Medical History  ILD Chronic hypoxia Afib Chronic AC OSA CVA   Significant Hospital Events: Including procedures, antibiotic start and stop dates in addition to other pertinent events   10/5 ED from facility for SOB admit to ICU on bipap  10/6 aspiration event noted this a.m. resulting and respiratory distress despite BiPAP.  Patient relayed to patient and myself that he does not want to be intubated  Interim History / Subjective:  Overnight refused bipap. Son came to bedside.  Objective    Blood pressure 132/71, pulse 89, temperature 98.2 F (36.8 C), temperature source Axillary, resp. rate (!) 50, height 6' (1.829 m), weight 112.1 kg, SpO2 93%.    Vent Mode: PCV;BIPAP FiO2 (%):  [70 %-100 %] 100 % Set Rate:  [20 bmp] 20 bmp PEEP:  [8 cmH20] 8 cmH20   Intake/Output Summary (Last 24 hours) at 07/23/2024 0912 Last data filed at 07/23/2024 9392 Gross per 24 hour  Intake --  Output 2350 ml  Net -2350 ml   Filed Weights   07/21/24 0412 07/22/24 0711 07/23/24 0400  Weight: 115 kg 114.6 kg  112.1 kg    Examination: Elderly man, not responsive on bipap Lots of leak despite mask adjustment due to beard RR in 30s-40s with bilateral crackles  BNP 315 CGBs controlled Hypokalemia  Resolved problem list   Assessment and Plan   Acute on chronic hypoxic resp failure -On nasal cannula 5L at baseline ILD Recurrent aspiration OSA Sepsis 2/2 Multifocal PNA Afib on Eliquis  Goals of care  [name] is in the actively dying process. He has communicated to me at multiple intervals that he would not want any aggressive resuscitative measures including chest compressions and intubation. I have confirmed this with his son today at bedside. He is having difficulty tolerating bipap. His RR is still quite high.  Per his son he appears more comfortable on the ativan and fentanyl . The patient's son was asking me about feeding tube today which I explain we don't give to patients who are actively dying as it doesn't support comfort or prolong life. He also would not be stable enough to tolerate any procedure. Nicholas [name] his son expresses that he didn't know about the ILD until this admission, and didn't know about the aspiration until Monday when the patient himself endorsed ongoing aspiration. The patient has a daughter who does not fully understand what is happening who lives in [name]  and plans on driving here. The patient also has a sister who accompanies him to all the doctor's appointments. I offered a family meeting  to help put everyone on the same page.  At this point there are no therapeutic options and my recommendation is a transition to full comfort measures. Palliative care has been consulted. I expect he has hours to days based on current symptoms and level of support.   Please call us  back if we can be of additional help.   I spent 50 minutes in total visit time for this patient, with more than 50% spent counseling/coordinating care.  Nicholas Gore, MD Pulmonary and Critical Care  Medicine [facility] 07/23/2024 9:21 AM Pager: see AMION  If no response to pager, please call critical care on call (see AMION) until 7pm After 7:00 pm call Elink

## 2024-07-23 NOTE — TOC Progression Note (Cosign Needed Addendum)
 Transition of Care [facility]) - Progression Note    Patient Details  Name: Nicholas [name] MRN: [id number] Date of Birth: October 22, 1950  Transition of Care [facility] CM/SW Contact  Whitfield Campanile, Student-Social Work Phone Number: 07/23/2024, 11:16 AM  Clinical Narrative:     TOC continuing to follow.  Whitfield Campanile, MSW Intern  Expected Discharge Plan: Skilled Nursing Facility Barriers to Discharge: Continued Medical Work up               Expected Discharge Plan and Services In-house Referral: Clinical Social Work     Living arrangements for the past 2 months: Skilled Nursing Facility                                       Social Drivers of Health (SDOH) Interventions SDOH Screenings   Food Insecurity: No Food Insecurity (07/20/2024)  Housing: Unknown (07/20/2024)  Transportation Needs: No Transportation Needs (07/20/2024)  Utilities: Not At Risk (07/20/2024)  Alcohol Screen: Low Risk  (02/28/2022)  Depression (PHQ2-9): Low Risk  (12/11/2022)  Recent Concern: Depression (PHQ2-9) - Medium Risk (10/30/2022)  Financial Resource Strain: Low Risk  (12/11/2022)  Physical Activity: Insufficiently Active (12/11/2022)  Social Connections: Moderately Isolated (07/20/2024)  Stress: No Stress Concern Present (12/11/2022)  Tobacco Use: Low Risk  (07/19/2024)    Readmission Risk Interventions    11/07/2022    9:43 AM  Readmission Risk Prevention Plan  Transportation Screening Complete  Medication Review (RN Care Manager) Complete  PCP or Specialist appointment within 3-5 days of discharge Complete  HRI or Home Care Consult Complete  SW Recovery Care/Counseling Consult Complete  Palliative Care Screening Not Applicable  Skilled Nursing Facility Complete

## 2024-07-23 NOTE — Progress Notes (Signed)
 Family ready to remove the bipap and transition to full comfort. Will continue morphine  drip, dc bipap, comfort measures placed in the chart.   Burgess Dare MD

## 2024-07-23 NOTE — Progress Notes (Signed)
 This chaplain responded to unit page for support with transition to comfort care. The Pt. son-Daniel and numerous family and friends are gathered at the bedside. The chaplain understands the universality of God's love brings peace to the family along with the Pt. comfort.  Chaplain Leeroy Hummer [phone]

## 2024-07-23 NOTE — Evaluation (Signed)
 RT Evaluate and Treat Note  07/23/2024   Breathing is (select one): Cannot answer questions   The following was found on auscultation (select multiple):  Bilateral Breath Sounds: Diminished (07/23/24 0823)  R Upper  Breath Sounds: Diminished (07/23/24 0823) L Upper Breath Sounds: Diminished (07/23/24 0823) R Lower Breath Sounds: Diminished (07/23/24 0823) L Lower Breath Sounds: Diminished (07/23/24 0823)    Cough Assessment: Cough: Non-productive (07/23/24 0326)    Most Recent Chest Xray:... (No results found. Low lung volumes with similar bibasilar airspace opacities.   The following medications and/or interventions were ordered/changed/discontinued as part of the Respiratory Treatment protocol:   Medication Changes: None   Airway Clearance Changes: None   Oxygen Therapy Changes: No Changes at this time.

## 2024-07-23 NOTE — Consult Note (Signed)
 Consultation Note Date: 07/23/2024   Patient Name: Nicholas [name]  DOB: Jun 25, 1951  MRN: [id number]  Age / Sex: 73 y.o., male  PCP: [name], MD Referring Physician: [name], MD  Reason for Consultation: Establishing goals of care  HPI/Patient Profile: 73 y.o. male  with past medical history of CVA, A-fib on Eliquis , OSA, and  ILD with chronic hypoxia 5 L nasal cannula admitted on 07/19/2024 with shortness of breath. CTA chest was negative for PE but showed nondiagnostic diffuse ground glass opacity bilaterally. Patient's condition worsened throughout hospitalization. PMT consulted to assist.   Clinical Assessment and Goals of Care: I have reviewed medical records including EPIC notes, labs and imaging, received report from Dr Caleen and Dr Meade as well as nurse [name], assessed the patient and then met with patient;s son Nicholas [name]  to discuss diagnosis prognosis, GOC, EOL wishes, disposition and options.  I introduced Palliative Medicine as specialized medical care for people living with serious illness. It focuses on providing relief from the symptoms and stress of a serious illness. The goal is to improve quality of life for both the patient and the family.  Nicholas [name] shares about his earlier conversations with Dr. Meade. He understands patient is declining despite current medical interventions. He understands the recommendations for comfort focused care.  We review patient's previously stated wishes to not be intubated or have feeding tube. We discuss previous statements of wanting to remove bipap.    We discussed patient's current illness and what it means in the larger context of patient's on-going co-morbidities.   We discuss option to focus more on comfort while we await more family. We discussed starting a morphine  infusion to provide relief while continuing bipap as we await more family. He agrees with this. We discuss option to  remove bipap when they are ready.   Low dose morphine  infusion started.   Returned to room throughout the day. Throughout the day patient remained on bipap with periods of alertness, able to interact some with family.   Towards the end of the day family called to share they were ready to free patient from bipap. Family aware without bipap support time would likely be short.   Provided medication for comfort prior to removing bipap. Unfortunately, patient quite symptomatic following bipap removal and required multiple boluses of morphine  and ativan along with rapid titration of infusion. Provider remained at bedside during this time to support family and ensure comfort.   All questions and concerns answered. Participated in life review at bedside. Emotional support provided.    Primary Decision Maker NEXT OF KIN - adult children - primarily communicated with son Nicholas [name] though he had been in conversation with his sister who supported decisions    SUMMARY OF RECOMMENDATIONS   - comfort measures only - continue morphine  infusion - continue prn ativan - anticipate hospital death  Code Status/Advance Care Planning: DNR  Prognosis:  Hours - Days  Discharge Planning: Anticipated Hospital Death      Primary Diagnoses: Present on Admission:  Acute on chronic respiratory failure with hypoxia (HCC)   I have reviewed the medical record, interviewed the patient and family, and examined the patient. The following aspects are pertinent.  Past Medical History:  Diagnosis Date   Acute kidney failure 08/2008   cleared up  no problems since   Acute meningitis 11/06/2022   Allergy    Anxiety    Arthritis    Asthma    ?of this no inhaler  Atrial fibrillation (HCC)    Blood transfusion    CHF (congestive heart failure) (HCC)    ?of this, pt denies   CLOSTRIDIUM DIFFICILE COLITIS 07/04/2010   Annotation: 12/09, 2/10 Qualifier: Diagnosis of  By: [name], [name]     COVID-19  08/12/22 completed Paxlovid  08/20/22 08/26/2022   Depression with anxiety    Duodenitis determined by biopsy 02/2016   peptic likely due to aleve (erosive gastropathy with duodenal erosions)   ECZEMA 07/04/2010   Qualifier: Diagnosis of  By: [name], [name]     Elevated liver enzymes    Fatty liver    Full dentures    GERD (gastroesophageal reflux disease)    Heart attack (HCC)    08/2008 (likely demand ischemia in the setting of MSSA sepsis/R TKA  infection)10-2008   History of hiatal hernia    History of stomach ulcers    Hypothyroidism    Lower GI bleeding    MALAR AND MAXILLARY BONES CLOSED FRACTURE 07/04/2010   Annotation: ORIF Qualifier: Diagnosis of  By: [name], [name]     Migraine    definitely   MRSA (methicillin resistant Staphylococcus aureus)    in leg, had to place steel rod in leg   Personal history of colonic adenoma 06/01/2003   PFO (patent foramen ovale)    small PFO by 08/2008 TEE   Pneumonia 08/2008   while in ICU   Pneumonia due to COVID-19 virus 08/12/2022   Seizures (HCC) 2009   long time ago     Sleep apnea    does not wear CPAP   Stroke (HCC) 2010   unable to complete sentences at times   Wears glasses    Social History   Socioeconomic History   Marital status: Widowed    Spouse name: Not on file   Number of children: 2   Years of education: 56   Highest education level: Not on file  Occupational History   Occupation: retired  Tobacco Use   Smoking status: Never    Passive exposure: Past   Smokeless tobacco: Never  Vaping Use   Vaping status: Never Used  Substance and Sexual Activity   Alcohol use: No    Alcohol/week: 0.0 standard drinks of alcohol    Comment: I abused alcohol; last drink  ~ 2005   Drug use: No   Sexual activity: Not Currently  Other Topics Concern   Not on file  Social History Narrative   Widower 2016 - wife passed from ESRD related illness   Lives in [facility] in [place].    Mormon       Screened positive for OSA 04/2018   Social Drivers of Health   Financial Resource Strain: Low Risk  (12/11/2022)   Overall Financial Resource Strain (CARDIA)    Difficulty of Paying Living Expenses: Not hard at all  Food Insecurity: No Food Insecurity (07/20/2024)   Hunger Vital Sign    Worried About Running Out of Food in the Last Year: Never true    Ran Out of Food in the Last Year: Never true  Transportation Needs: No Transportation Needs (07/20/2024)   PRAPARE - Administrator, Civil Service (Medical): No    Lack of Transportation (Non-Medical): No  Physical Activity: Insufficiently Active (12/11/2022)   Exercise Vital Sign    Days of Exercise per Week: 3 days    Minutes of Exercise per Session: 20 min  Stress: No Stress Concern Present (12/11/2022)   [place]  [employer] - Occupational Stress Questionnaire    Feeling of Stress : Not at all  Social Connections: Moderately Isolated (07/20/2024)   Social Connection and Isolation Panel    Frequency of Communication with Friends and Family: More than three times a week    Frequency of Social Gatherings with Friends and Family: Once a week    Attends Religious Services: More than 4 times per year    Active Member of [employer] or Organizations: No    Attends Banker Meetings: Never    Marital Status: Widowed   Family History  Problem Relation Age of Onset   Coronary artery disease Father 39   Stroke Father    CAD Mother 83       stents   Breast cancer Sister    Diabetes Brother    Diabetes Sister    Colon cancer Neg Hx    Stomach cancer Neg Hx    Scheduled Meds:  arformoterol  15 mcg Nebulization BID   budesonide (PULMICORT) nebulizer solution  0.5 mg Nebulization BID   Chlorhexidine  Gluconate Cloth  6 each Topical Daily   enoxaparin  (LOVENOX ) injection  120 mg Subcutaneous Q12H   flecainide  100 mg Oral BID   furosemide   40 mg Intravenous BID   insulin aspart  0-20 Units Subcutaneous  Q4H   methylPREDNISolone  (SOLU-MEDROL ) injection  40 mg Intravenous BID   mouth rinse  15 mL Mouth Rinse 4 times per day   pantoprazole  (PROTONIX ) IV  40 mg Intravenous Q24H   Continuous Infusions:  azithromycin  500 mg (07/22/24 1100)   morphine      piperacillin-tazobactam (ZOSYN)  IV 3.375 g (07/23/24 0507)   valproate sodium 500 mg (07/22/24 2250)   PRN Meds:.docusate sodium , fentaNYL  (SUBLIMAZE ) injection, guaiFENesin , hydrALAZINE, levalbuterol, LORazepam, metoprolol  tartrate, morphine , ondansetron  (ZOFRAN ) IV, mouth rinse, polyethylene glycol, traZODone  Allergies  Allergen Reactions   Peanut-Containing Drug Products Anaphylaxis and Dermatitis   Yellow Jacket Venom [Bee Venom] Anaphylaxis and Other (See Comments)    Respiratory Distress   Celebrex [Celecoxib] Other (See Comments)    BLACK STOOL?MELENA?   Percocet [Oxycodone -Acetaminophen ] Hives, Swelling and Other (See Comments)   Latex Rash   Nsaids Rash   Review of Systems  Unable to perform ROS: Mental status change    Physical Exam Constitutional:      General: He is not in acute distress.    Appearance: He is ill-appearing.     Comments: lethargic  Cardiovascular:     Rate and Rhythm: Normal rate.  Pulmonary:     Effort: Respiratory distress present.     Comments: tachypnea Skin:    General: Skin is warm and dry.     Vital Signs: BP (!) 123/51 (BP Location: Left Arm)   Pulse 96   Temp 98.2 F (36.8 C) (Axillary)   Resp (!) 24   Ht 6' (1.829 m)   Wt 112.1 kg   SpO2 94%   BMI 33.52 kg/m  Pain Scale: Not given for pain   Pain Score: Asleep   SpO2: SpO2: 94 % O2 Device:SpO2: 94 % O2 Flow Rate: .O2 Flow Rate (L/min): 15 L/min  IO: Intake/output summary:  Intake/Output Summary (Last 24 hours) at 07/23/2024 1019 Last data filed at 07/23/2024 0607 Gross per 24 hour  Intake --  Output 2350 ml  Net -2350 ml    LBM: Last BM Date : 07/21/24 Baseline Weight: Weight: 116.6 kg Most recent weight: Weight:  112.1 kg     Palliative  Assessment/Data: PPS 10%     *Please note that this is a verbal dictation therefore any spelling or grammatical errors are due to the Dragon Medical One system interpretation.   Time Total: 140 minutes Time spent includes: Detailed review of medical records (labs, imaging, vital signs), medically appropriate exam, discussion with treatment team, counseling and educating patient, family and/or staff, documenting clinical information, medication management and coordination of care.    Tobey Jama Barnacle, DNP, AGNP-C Palliative Medicine Team [phone] Pager: [phone]

## 2024-07-23 NOTE — Progress Notes (Signed)
 SLP Cancellation Note  Patient Details Name: Nicholas [name] MRN: [id number] DOB: 10-Jan-1951   Cancelled treatment:       Reason Eval/Treat Not Completed: Medical issues which prohibited therapy (on BiPAP). SLP will f/u pending ongoing GOC discussions.    Damien Blumenthal, M.A., CCC-SLP Speech Language Pathology, Acute Rehabilitation Services  Secure Chat preferred [phone]  07/23/2024, 9:06 AM

## 2024-07-24 DIAGNOSIS — Z789 Other specified health status: Secondary | ICD-10-CM

## 2024-07-24 DIAGNOSIS — J9621 Acute and chronic respiratory failure with hypoxia: Secondary | ICD-10-CM | POA: Diagnosis not present

## 2024-07-24 DIAGNOSIS — Z515 Encounter for palliative care: Secondary | ICD-10-CM | POA: Diagnosis not present

## 2024-07-24 DIAGNOSIS — J9601 Acute respiratory failure with hypoxia: Secondary | ICD-10-CM | POA: Diagnosis not present

## 2024-07-24 DIAGNOSIS — Z7189 Other specified counseling: Secondary | ICD-10-CM | POA: Diagnosis not present

## 2024-07-24 DIAGNOSIS — R4589 Other symptoms and signs involving emotional state: Secondary | ICD-10-CM

## 2024-07-24 LAB — CULTURE, BLOOD (ROUTINE X 2)
Culture: NO GROWTH
Culture: NO GROWTH
Special Requests: ADEQUATE
Special Requests: ADEQUATE

## 2024-07-28 ENCOUNTER — Ambulatory Visit: Admitting: Internal Medicine

## 2024-07-29 ENCOUNTER — Ambulatory Visit: Admitting: Podiatry

## 2024-08-15 NOTE — Plan of Care (Signed)

## 2024-08-15 NOTE — Progress Notes (Signed)
 Daily Progress Note   Date: August 02, 2024   Patient Name: Nicholas [name]  DOB: 08/04/1951  MRN: [id number]  Age / Sex: 73 y.o., male  Attending Physician: [name], MD Primary Care Physician: [name], MD Admit Date: 07/19/2024 Length of Stay: 5 days  Reason for Follow-up: Non pain symptom management, Pain control, Psychosocial/spiritual support, and Terminal Care  Past Medical History:  Diagnosis Date   Acute kidney failure 08/2008   cleared up  no problems since   Acute meningitis 11/06/2022   Allergy    Anxiety    Arthritis    Asthma    ?of this no inhaler   Atrial fibrillation (HCC)    Blood transfusion    CHF (congestive heart failure) (HCC)    ?of this, pt denies   CLOSTRIDIUM DIFFICILE COLITIS 07/04/2010   Annotation: 12/09, 2/10 Qualifier: Diagnosis of  By: [name], [name]     COVID-19 08/12/22 completed Paxlovid  08/20/22 08/26/2022   Depression with anxiety    Duodenitis determined by biopsy 02/2016   peptic likely due to aleve (erosive gastropathy with duodenal erosions)   ECZEMA 07/04/2010   Qualifier: Diagnosis of  By: [name], [name]     Elevated liver enzymes    Fatty liver    Full dentures    GERD (gastroesophageal reflux disease)    Heart attack (HCC)    08/2008 (likely demand ischemia in the setting of MSSA sepsis/R TKA  infection)10-2008   History of hiatal hernia    History of stomach ulcers    Hypothyroidism    Lower GI bleeding    MALAR AND MAXILLARY BONES CLOSED FRACTURE 07/04/2010   Annotation: ORIF Qualifier: Diagnosis of  By: [name], [name]     Migraine    definitely   MRSA (methicillin resistant Staphylococcus aureus)    in leg, had to place steel rod in leg   Personal history of colonic adenoma 06/01/2003   PFO (patent foramen ovale)    small PFO by 08/2008 TEE   Pneumonia 08/2008   while in ICU   Pneumonia due to COVID-19 virus 08/12/2022   Seizures (HCC) 2009   long time ago     Sleep apnea    does not wear  CPAP   Stroke (HCC) 2010   unable to complete sentences at times   Wears glasses     Subjective:   Subjective: Chart Reviewed. Updates received. Patient Assessed. Created space and opportunity for patient  and family to explore thoughts and feelings regarding current medical situation.  Today's Discussion: Today before meeting with the patient/family, I reviewed the chart notes including TOC note from yesterday, internal medicine note from yesterday, chaplain note from yesterday, internal medicine from later in the day, nursing notes from yesterday, nursing notes from today. I also reviewed vital signs, nursing flowsheets, and medication administrations record. No labs due to comfort care status.  Vital signs today include temperature 97.6, heart rate 138, respiratory rate 15, blood pressure 69/44, satting 75% on room air.  Comfort medications administered include 2 doses of Ativan yesterday, and additional 2 doses at 00:51 AM and 6:35 AM.  Remains on a morphine  drip that has been titrated from 5 to 10 to 13 mg/h.  He received a bolus of morphine  of 5 mg at 1:32 AM.  No other symptom management medications required.  Today saw the patient at bedside, multiple family members including his son were present.  I allowed space and opportunity for family to express  their feelings and asked questions.  They note that he has been like this for the past several hours.  Previously he was having leg kicking but Ativan has helped with that.  I encouraged him to advocate for him for anything that they see that indicates discomfort or distress.  We talked about morphine  and how it can help with pain as well as dyspnea.  We discussed that there is still significant room to increase his morphine  drip, morphine  boluses are available as well.  Ativan also remains available.  We talked about the dying process and explained that low blood pressure, desaturations, irregular breathing pattern and apneic pauses are  very regular.  These are not signs of discomfort.  The patient appears to be actively dying with a regular respiratory pattern, apneic pauses, agonal breathing.  Otherwise, there are no significant signs of distress or discomfort.  I encouraged the family to reach out to nursing if they see anything that makes him concerned about his comfort.  I shared a palliative medicine will continue to follow while the patient remains admitted for comfort care. I provided emotional and general support through therapeutic listening, empathy, sharing of stories, and other techniques. I answered all questions and addressed all concerns to the best of my ability.  Review of Systems  Unable to perform ROS   Objective:   Primary Diagnoses: Present on Admission:  Acute on chronic respiratory failure with hypoxia (HCC)   Vital Signs:  BP (!) 69/44 (BP Location: Left Arm)   Pulse (!) 138   Temp 97.6 F (36.4 C) (Oral)   Resp 15   Ht 6' (1.829 m)   Wt 112.1 kg   SpO2 (!) 75%   BMI 33.52 kg/m   Physical Exam Vitals and nursing note reviewed.  Constitutional:      General: He is sleeping. He is not in acute distress. HENT:     Head: Normocephalic and atraumatic.  Cardiovascular:     Rate and Rhythm: Tachycardia present.  Pulmonary:     Comments: Irregular respiratory pattern, agonal breathing, apneic pauses Abdominal:     General: Abdomen is flat.  Neurological:     Mental Status: He is unresponsive.     Palliative Assessment/Data: 10%   Existing Vynca/ACP Documentation: None  Assessment & Plan:   HPI/Patient Profile:  73 y.o. male  with past medical history of CVA, A-fib on Eliquis , OSA, and  ILD with chronic hypoxia 5 L nasal cannula admitted on 07/19/2024 with shortness of breath. CTA chest was negative for PE but showed nondiagnostic diffuse ground glass opacity bilaterally. Patient's condition worsened throughout hospitalization. PMT consulted to assist.   SUMMARY OF RECOMMENDATIONS    DNR-comfort Continue comfort care See symptom management orders below Palliative medicine will continue to follow  Symptom Management:  Biotene solution 15 mL topical as needed dry mouth Artificial tears 1 drop OU 4 times daily as needed dry eyes Fentanyl  25 mcg IV every hour as needed SOB, increased WOB Robinul  0.1 mg IV every 6 hours as needed excessive secretions Haldol 0.5 mg IV every 4 hours as needed agitation delirium Ativan 2 mg IV every 2 hours as needed anxiety Lopressor  5 mg IV every 4 hours as needed heart rate greater than 110 Morphine  drip 1 to 15 mg/h titrate per instructions Morphine  bolus for infusion 5 mg IV every 5 minutes as needed pain, dyspnea, RR > 20 Zofran  4 mg IV every 6 hours as needed nausea or vomiting  Code Status: DNR -  Comfort  Prognosis: Hours - Days  Discharge Planning: Anticipated Hospital Death  Discussed with: Patient's family, medical team, nursing team  Thank you for allowing us  to participate in the care of BRIA PORTALES PMT will continue to support holistically.  Billing based on MDM: High  Problems Addressed: One acute or chronic illness or injury that poses a threat to life or bodily function  Risks: Parenteral controlled substances  Detailed review of medical records (labs, imaging, vital signs), medically appropriate exam, discussed with treatment team, counseling and education to patient, family, & staff, documenting clinical information, medication management, coordination of care  Camellia Kays, NP Palliative Medicine Team  Team Phone # [phone] (Nights/Weekends)  06/13/2021, 8:17 AM

## 2024-08-15 NOTE — Progress Notes (Addendum)
 Pt's family declined Q shift vitals , pt comfort care. Provider on call aware of the above. See new order , vitals daily per new order.

## 2024-08-15 NOTE — Death Summary Note (Signed)
 DEATH SUMMARY   Patient Details  Name: Nicholas [name] MRN: [id number] DOB: 1951-07-05 ERE:Aojxz, Devoria LABOR, MD Admission/Discharge Information   Admit Date:  08-18-24  Date of Death: Date of Death: Aug 23, 2024  Time of Death: Time of Death: 1245-08-27  Length of Stay: 5   Principle Cause of death: Acute Respiratory Failure  Hospital Diagnoses: Principal Problem:   Acute on chronic respiratory failure with hypoxia [facility])   Hospital Course: Brief Narrative:   73 year old with history of CVA, A-fib on Eliquis , OSA, ILD with chronic hypoxia 5 L nasal cannula came to the ED from facility on 10/5 with ongoing shortness of breath.  Apparently started on doxycycline  for presumed pneumonia at the facility on 9/24 without any help.  CTA chest was negative for PE but showed nondiagnostic diffuse ground glass opacity bilaterally.  In the ER required BiPAP and received Rocephin  and azithromycin .  There was concerns of aspiration noted on 10/6 and patient did not want to be intubated.  Now remains off BiPAP on high flow. Worsening condition therefore transitioning him to comfort care. Was on morphine  drip, eventually passed on 2024-08-23  Assessment & Plan:  Acute on chronic hypoxic respiratory failure Diffuse ground glass opacity/interstitial lung disease Sepsis secondary to multifocal pneumonia Paroxysmal atrial fibrillation History of CVA Chronic migraine Recurrent aspirations   Was on morphine  drip, eventually passed on 2024-08-23       The results of significant diagnostics from this hospitalization (including imaging, microbiology, ancillary and laboratory) are listed below for reference.   Significant Diagnostic Studies: VAS US  LOWER EXTREMITY VENOUS (DVT) Result Date: 07/20/2024  Lower Venous DVT Study Patient Name:  Nicholas [name]  Date of Exam:   08/18/24 Medical Rec #: [id number]       Accession #:    [id number] Date of Birth: 04/09/1951        Patient Gender: M Patient Age:   70  years Exam Location:  [facility] Procedure:      VAS US  LOWER EXTREMITY VENOUS (DVT) Referring Phys: GRACE BOWSER --------------------------------------------------------------------------------  Indications: Edema, and SOB.  Anticoagulation: Eliquis . Limitations: Body habitus and poor ultrasound/tissue interface. Comparison Study: Previous exam on 12/31/2023 was negative for DVT Performing Technologist: Alberta Lis RVS  Examination Guidelines: A complete evaluation includes B-mode imaging, spectral Doppler, color Doppler, and power Doppler as needed of all accessible portions of each vessel. Bilateral testing is considered an integral part of a complete examination. Limited examinations for reoccurring indications may be performed as noted. The reflux portion of the exam is performed with the patient in reverse Trendelenburg.  +-----+---------------+---------+-----------+----------+--------------+ RIGHTCompressibilityPhasicitySpontaneityPropertiesThrombus Aging +-----+---------------+---------+-----------+----------+--------------+ CFV  Full           Yes      Yes                                 +-----+---------------+---------+-----------+----------+--------------+   +--------+---------------+---------+-----------+----------+--------------------+ LEFT    CompressibilityPhasicitySpontaneityPropertiesThrombus Aging       +--------+---------------+---------+-----------+----------+--------------------+ CFV     Full           Yes      Yes                                       +--------+---------------+---------+-----------+----------+--------------------+ SFJ     Full                                                              +--------+---------------+---------+-----------+----------+--------------------+  FV Prox Full           Yes      Yes                                       +--------+---------------+---------+-----------+----------+--------------------+ FV Mid   Full           Yes      Yes                                       +--------+---------------+---------+-----------+----------+--------------------+ FV      Full           Yes      Yes                                       Distal                                                                    +--------+---------------+---------+-----------+----------+--------------------+ PFV     Full                                                              +--------+---------------+---------+-----------+----------+--------------------+ POP                    Yes      Yes                  patent by                                                                 color/doppler        +--------+---------------+---------+-----------+----------+--------------------+ PTV     Full           Yes      Yes                                       +--------+---------------+---------+-----------+----------+--------------------+ PERO                                                 not visualized       +--------+---------------+---------+-----------+----------+--------------------+    Summary: RIGHT: - No evidence of common femoral vein obstruction.   LEFT: - There is no evidence of deep vein thrombosis in the lower extremity.  - No cystic structure found in the popliteal fossa.  *See table(s)  above for measurements and observations. Electronically signed by Gaile New MD on 07/20/2024 at 8:18:25 AM.    Final    DG CHEST PORT 1 VIEW Result Date: 07/19/2024 EXAM: 1 VIEW XRAY OF THE CHEST 07/19/2024 08:55:00 PM COMPARISON: 07/19/2024 CLINICAL HISTORY: Chest pain. Respiratory distress. FINDINGS: LUNGS AND PLEURA: Bibasilar airspace opacities. Low lung volumes. HEART AND MEDIASTINUM: Stable cardiomegaly. BONES AND SOFT TISSUES: Bilateral shoulder arthroplasty noted. IMPRESSION: 1. Stable cardiomegaly. 2. Low lung volumes with similar bibasilar airspace opacities. Electronically signed by:  Dorethia Molt MD 07/19/2024 09:20 PM EDT RP Workstation: [id number]   CT Angio Chest PE W and/or Wo Contrast Result Date: 07/19/2024 CLINICAL DATA:  hypoxemia EXAM: CT ANGIOGRAPHY CHEST WITH CONTRAST TECHNIQUE: Multidetector CT imaging of the chest was performed using the standard protocol during bolus administration of intravenous contrast. Multiplanar CT image reconstructions and MIPs were obtained to evaluate the vascular anatomy. RADIATION DOSE REDUCTION: This exam was performed according to the departmental dose-optimization program which includes automated exposure control, adjustment of the mA and/or kV according to patient size and/or use of iterative reconstruction technique. CONTRAST:  OMNIPAQUE  IOHEXOL  350 MG/ML SOLN COMPARISON:  January 20, 2024. FINDINGS: Evaluation is limited by motion and suboptimal contrast timing. Cardiovascular: Motion and suboptimal contrast timing limits evaluation. No central pulmonary embolism. Evaluation for segmental pulmonary embolism is limited and is nondiagnostic for subsegmental pulmonary artery. Heart is enlarged. No pericardial effusion. Mild LEFT-sided coronary artery atherosclerotic calcifications. Atherosclerotic calcifications of the thoracic aorta. Mild aneurysmal dilation of the ascending thoracic aorta to 4.0 cm, similar compared to prior. Mediastinum/Nodes: Global increase in size of mediastinal lymph nodes since prior. Representative RIGHT paratracheal lymph node measures 12 mm in the short axis, previously 6 mm (series 7, image 132). No axillary adenopathy. Lungs/Pleura: No pleural effusion or pneumothorax. There has been interval mint of diffuse ground-glass opacity a scattered areas of interlobular septal thickening most confluent in the bilateral lower lobes. This is superimposed on a background of traction bronchiectasis with subpleural reticulation consistent with known underlying interstitial lung disease. Previously described pulmonary nodules  are not visualized as they are obscured by a background of extensive ground-glass opacity. Upper Abdomen: Relative diffuse hypoattenuation of the liver. Musculoskeletal: No acute osseous abnormality. Status post bilateral shoulder arthroplasties. Review of the MIP images confirms the above findings. IMPRESSION: 1. No central pulmonary embolism. Evaluation for segmental pulmonary embolism is limited and is nondiagnostic for subsegmental pulmonary artery. If concern for pulmonary embolism, consider lower extremity ultrasound. 2. There has been interval development of diffuse ground-glass opacity with scattered areas of interlobular septal thickening most confluent in the bilateral lower lobes. This is superimposed on a background of known interstitial lung disease. Findings are favored to reflect atypical infection although degree of underlying pulmonary edema may be present. 3. Global increase in size of mediastinal lymph nodes since prior. This is favored reactive. Aortic Atherosclerosis (ICD10-I70.0). Electronically Signed   By: Corean Salter M.D.   On: 07/19/2024 11:06   DG Chest Portable 1 View Result Date: 07/19/2024 CLINICAL DATA:  Shortness of breath with hypoxemia. EXAM: PORTABLE CHEST 1 VIEW COMPARISON:  Portable chest 06/16/2024. FINDINGS: There is a interval worsening patchy consolidation in the mid to lower lung fields consistent with pneumonia or aspiration. Small pleural effusions are beginning to form. There is a low inspiration with chronic elevation of the right hemidiaphragm. Stable cardiomegaly. No vascular congestion is seen. The mediastinum is unchanged with widening superiorly probably related to lipomatosis. There is calcification of the transverse  aorta. Bilateral shoulder replacements. Degenerative change of the spine. IMPRESSION: 1. Interval worsening of patchy consolidation in the mid to lower lung fields consistent with pneumonia or aspiration. 2. Small pleural effusions are  beginning to form. 3. Stable cardiomegaly. Electronically Signed   By: Francis Quam M.D.   On: 07/19/2024 06:46    Microbiology: Recent Results (from the past 240 hours)  Blood culture (routine x 2)     Status: None   Collection Time: 07/19/24  6:25 AM   Specimen: BLOOD RIGHT ARM  Result Value Ref Range Status   Specimen Description BLOOD RIGHT ARM  Final   Special Requests   Final    BOTTLES DRAWN AEROBIC AND ANAEROBIC Blood Culture adequate volume   Culture   Final    NO GROWTH 5 DAYS Performed at [facility], [address], [place], [place] [address]    Report Status August 18, 2024 FINAL  Final  Blood culture (routine x 2)     Status: None   Collection Time: 07/19/24  6:30 AM   Specimen: BLOOD LEFT ARM  Result Value Ref Range Status   Specimen Description BLOOD LEFT ARM  Final   Special Requests   Final    BOTTLES DRAWN AEROBIC AND ANAEROBIC Blood Culture adequate volume   Culture   Final    NO GROWTH 5 DAYS Performed at [facility], [address], [place], [place] [address]    Report Status 2024-08-18 FINAL  Final  Respiratory (~20 pathogens) panel by PCR     Status: None   Collection Time: 07/19/24 11:45 AM   Specimen: Nasopharyngeal Swab; Respiratory  Result Value Ref Range Status   Adenovirus NOT DETECTED NOT DETECTED Final   Coronavirus 229E NOT DETECTED NOT DETECTED Final    Comment: (NOTE) The Coronavirus on the Respiratory Panel, DOES NOT test for the novel  Coronavirus (2019 nCoV)    Coronavirus HKU1 NOT DETECTED NOT DETECTED Final   Coronavirus NL63 NOT DETECTED NOT DETECTED Final   Coronavirus OC43 NOT DETECTED NOT DETECTED Final   Metapneumovirus NOT DETECTED NOT DETECTED Final   Rhinovirus / Enterovirus NOT DETECTED NOT DETECTED Final   Influenza A NOT DETECTED NOT DETECTED Final   Influenza B NOT DETECTED NOT DETECTED Final   Parainfluenza Virus 1 NOT DETECTED NOT DETECTED Final   Parainfluenza Virus 2 NOT DETECTED NOT DETECTED Final    Parainfluenza Virus 3 NOT DETECTED NOT DETECTED Final   Parainfluenza Virus 4 NOT DETECTED NOT DETECTED Final   Respiratory Syncytial Virus NOT DETECTED NOT DETECTED Final   Bordetella pertussis NOT DETECTED NOT DETECTED Final   Bordetella Parapertussis NOT DETECTED NOT DETECTED Final   Chlamydophila pneumoniae NOT DETECTED NOT DETECTED Final   Mycoplasma pneumoniae NOT DETECTED NOT DETECTED Final    Comment: Performed at [facility], [address] [place], [place] [address]  SARS Coronavirus 2 by RT PCR (hospital order, performed in [facility] hospital lab) *cepheid single result test* Nasopharyngeal Swab     Status: None   Collection Time: 07/19/24 11:45 AM   Specimen: Nasopharyngeal Swab; Nasal Swab  Result Value Ref Range Status   SARS Coronavirus 2 by RT PCR NEGATIVE NEGATIVE Final    Comment: Performed at [facility], [address], [place], [place] [address]  MRSA Next Gen by PCR, Nasal     Status: None   Collection Time: 07/21/24 12:00 AM   Specimen: Nasal Mucosa; Nasal Swab  Result Value Ref Range  Status   MRSA by PCR Next Gen NOT DETECTED NOT DETECTED Final    Comment: (NOTE) The GeneXpert MRSA Assay (FDA approved for NASAL specimens only), is one component of a comprehensive MRSA colonization surveillance program. It is not intended to diagnose MRSA infection nor to guide or monitor treatment for MRSA infections. Test performance is not FDA approved in patients less than 78 years old. Performed at [facility], [address], [place], [place] [address]     Time spent: 20 minutes  Signed: Burgess JAYSON Dare, MD 2024/08/13

## 2024-08-15 NOTE — Progress Notes (Signed)
 PROGRESS NOTE    Nicholas [name]  FMW:[id number] DOB: 1951/10/01 DOA: 07/19/2024 PCP: [name], MD    Brief Narrative:   73 year old with history of CVA, A-fib on Eliquis , OSA, ILD with chronic hypoxia 5 L nasal cannula came to the ED from facility on 10/5 with ongoing shortness of breath.  Apparently started on doxycycline  for presumed pneumonia at the facility on 9/24 without any help.  CTA chest was negative for PE but showed nondiagnostic diffuse ground glass opacity bilaterally.  In the ER required BiPAP and received Rocephin  and azithromycin .  There was concerns of aspiration noted on 10/6 and patient did not want to be intubated.  Now remains off BiPAP on high flow. Worsening condition therefore transitioning him to comfort care  Assessment & Plan:  Acute on chronic hypoxic respiratory failure Diffuse ground glass opacity/interstitial lung disease Sepsis secondary to multifocal pneumonia Paroxysmal atrial fibrillation History of CVA Chronic migraine Recurrent aspirations   Unfortunately due to poor prognosis and significant decline in his condition despite of aggressive medical management and efforts it was eventually decided to transition patient to comfort care.  Palliative care team, pulmonary and medicine team in agreement.  Discussed extensively with family members who were also in agreement. Currently on morphine  drip  Anticipate in-hospital death  DVT prophylaxis: Comfort care patient DNR/DNI   PT Follow up Recs:   Subjective: Seen at bedside,.  Resting comfortably.  On morphine  drip. Patient's son and 2 sisters are at bedside as well.  Examination:  General exam: Somnolent Respiratory system: b/l diffuse rhonchi Cardiovascular system: S1 & S2 heard, RRR. No JVD, murmurs, rubs, gallops or clicks. No pedal edema. Gastrointestinal system: Abdomen is nondistended, soft and nontender. No organomegaly or masses felt. Normal bowel sounds heard. Central  nervous system: Lethargic Extremities: No evidence of swelling Skin: No rashes, lesions or ulcers Psychiatry: Able to assess                Diet Orders (From admission, onward)     Start     Ordered   07/23/24 1742  Diet regular Room service appropriate? Yes; Fluid consistency: Thin  Diet effective now       Question Answer Comment  Room service appropriate? Yes   Fluid consistency: Thin      07/23/24 1741            Objective: Vitals:   07/23/24 1100 07/23/24 1245 07/23/24 1538 08-17-24 0644  BP: 131/68   (!) 69/44  Pulse: (!) 51 (!) 53 (!) 55 (!) 138  Resp: (!) 28 (!) 29 (!) 27 15  Temp:    97.6 F (36.4 C)  TempSrc:    Oral  SpO2: 99% 98% 100% (!) 75%  Weight:      Height:        Intake/Output Summary (Last 24 hours) at 08/17/2024 1231 Last data filed at 08/17/2024 0644 Gross per 24 hour  Intake --  Output 450 ml  Net -450 ml   Filed Weights   07/21/24 0412 07/22/24 0711 07/23/24 0400  Weight: 115 kg 114.6 kg 112.1 kg    Scheduled Meds:  arformoterol  15 mcg Nebulization BID   budesonide (PULMICORT) nebulizer solution  0.5 mg Nebulization BID   Chlorhexidine  Gluconate Cloth  6 each Topical Daily   mouth rinse  15 mL Mouth Rinse 4 times per day   Continuous Infusions:  morphine  13 mg/hr (2024-08-17 0523)    Nutritional status     Body mass index  is 33.52 kg/m.  Data Reviewed:   CBC: Recent Labs  Lab 07/19/24 0627 07/19/24 9357 07/19/24 0643 07/20/24 0351 07/20/24 2249 07/23/24 0327  WBC 13.8*  --   --  7.8  --  13.3*  NEUTROABS 10.3*  --   --   --   --   --   HGB 12.9* 13.3 13.3 11.7* 12.2* 12.7*  HCT 40.7 39.0 39.0 36.6* 36.0* 39.0  MCV 95.3  --   --  94.3  --  93.8  PLT 216  --   --  200  --  237   Basic Metabolic Panel: Recent Labs  Lab 07/20/24 0351 07/20/24 2249 07/21/24 0646 07/21/24 2037 07/22/24 0341 07/23/24 0327  NA 139 141 141 143 141 145  K 3.6 3.6 3.3* 3.7 3.7 3.6  CL 99  --  102 102 102 103  CO2 27   --  26 25 28 29   GLUCOSE 167*  --  135* 165* 127* 145*  BUN 21  --  27* 32* 34* 39*  CREATININE 1.17  --  1.11 1.39* 1.20 1.18  CALCIUM  8.8*  --  9.1 8.6* 8.6* 8.6*  MG  --   --   --   --  2.2 2.4  PHOS  --   --   --   --  3.4  --    GFR: Estimated Creatinine Clearance: 72.1 mL/min (by C-G formula based on SCr of 1.18 mg/dL). Liver Function Tests: Recent Labs  Lab 07/19/24 0627 07/22/24 0341  AST 25  --   ALT 10  --   ALKPHOS 69  --   BILITOT 3.7*  --   PROT 7.8  --   ALBUMIN  3.3* 2.7*   No results for input(s): LIPASE, AMYLASE in the last 168 hours. No results for input(s): AMMONIA in the last 168 hours. Coagulation Profile: Recent Labs  Lab 07/19/24 0627  INR 1.6*   Cardiac Enzymes: No results for input(s): CKTOTAL, CKMB, CKMBINDEX, TROPONINI in the last 168 hours. BNP (last 3 results) No results for input(s): PROBNP in the last 8760 hours. HbA1C: No results for input(s): HGBA1C in the last 72 hours. CBG: Recent Labs  Lab 07/22/24 1622 07/22/24 2028 07/22/24 2334 07/23/24 0415 07/23/24 0835  GLUCAP 113* 119* 151* 128* 115*   Lipid Profile: No results for input(s): CHOL, HDL, LDLCALC, TRIG, CHOLHDL, LDLDIRECT in the last 72 hours. Thyroid  Function Tests: No results for input(s): TSH, T4TOTAL, FREET4, T3FREE, THYROIDAB in the last 72 hours. Anemia Panel: No results for input(s): VITAMINB12, FOLATE, FERRITIN, TIBC, IRON , RETICCTPCT in the last 72 hours. Sepsis Labs: Recent Labs  Lab 07/19/24 9361 07/19/24 0831 07/19/24 1738  PROCALCITON  --   --  0.20  LATICACIDVEN 3.0* 2.5*  --     Recent Results (from the past 240 hours)  Blood culture (routine x 2)     Status: None   Collection Time: 07/19/24  6:25 AM   Specimen: BLOOD RIGHT ARM  Result Value Ref Range Status   Specimen Description BLOOD RIGHT ARM  Final   Special Requests   Final    BOTTLES DRAWN AEROBIC AND ANAEROBIC Blood Culture adequate  volume   Culture   Final    NO GROWTH 5 DAYS Performed at [facility], [address] [place], [place] [address]    Report Status 17-Aug-2024 FINAL  Final  Blood culture (routine x 2)     Status: None   Collection Time: 07/19/24  6:30 AM  Specimen: BLOOD LEFT ARM  Result Value Ref Range Status   Specimen Description BLOOD LEFT ARM  Final   Special Requests   Final    BOTTLES DRAWN AEROBIC AND ANAEROBIC Blood Culture adequate volume   Culture   Final    NO GROWTH 5 DAYS Performed at [facility], [address], [place], [place] [address]    Report Status 08-02-2024 FINAL  Final  Respiratory (~20 pathogens) panel by PCR     Status: None   Collection Time: 07/19/24 11:45 AM   Specimen: Nasopharyngeal Swab; Respiratory  Result Value Ref Range Status   Adenovirus NOT DETECTED NOT DETECTED Final   Coronavirus 229E NOT DETECTED NOT DETECTED Final    Comment: (NOTE) The Coronavirus on the Respiratory Panel, DOES NOT test for the novel  Coronavirus (2019 nCoV)    Coronavirus HKU1 NOT DETECTED NOT DETECTED Final   Coronavirus NL63 NOT DETECTED NOT DETECTED Final   Coronavirus OC43 NOT DETECTED NOT DETECTED Final   Metapneumovirus NOT DETECTED NOT DETECTED Final   Rhinovirus / Enterovirus NOT DETECTED NOT DETECTED Final   Influenza A NOT DETECTED NOT DETECTED Final   Influenza B NOT DETECTED NOT DETECTED Final   Parainfluenza Virus 1 NOT DETECTED NOT DETECTED Final   Parainfluenza Virus 2 NOT DETECTED NOT DETECTED Final   Parainfluenza Virus 3 NOT DETECTED NOT DETECTED Final   Parainfluenza Virus 4 NOT DETECTED NOT DETECTED Final   Respiratory Syncytial Virus NOT DETECTED NOT DETECTED Final   Bordetella pertussis NOT DETECTED NOT DETECTED Final   Bordetella Parapertussis NOT DETECTED NOT DETECTED Final   Chlamydophila pneumoniae NOT DETECTED NOT DETECTED Final   Mycoplasma pneumoniae NOT DETECTED NOT DETECTED Final    Comment: Performed at [facility], [address], [place], [place] [address]  SARS Coronavirus 2 by RT PCR (hospital order, performed in [facility] hospital lab) *cepheid single result test* Nasopharyngeal Swab     Status: None   Collection Time: 07/19/24 11:45 AM   Specimen: Nasopharyngeal Swab; Nasal Swab  Result Value Ref Range Status   SARS Coronavirus 2 by RT PCR NEGATIVE NEGATIVE Final    Comment: Performed at [facility], [address] [place], [place] [address]  MRSA Next Gen by PCR, Nasal     Status: None   Collection Time: 07/21/24 12:00 AM   Specimen: Nasal Mucosa; Nasal Swab  Result Value Ref Range Status   MRSA by PCR Next Gen NOT DETECTED NOT DETECTED Final    Comment: (NOTE) The GeneXpert MRSA Assay (FDA approved for NASAL specimens only), is one component of a comprehensive MRSA colonization surveillance program. It is not intended to diagnose MRSA infection nor to guide or monitor treatment for MRSA infections. Test performance is not FDA approved in patients less than 97 years old. Performed at [facility], [address] [place], [place] [address]          Radiology Studies: No results found.         LOS: 5 days   Time spent= 35 mins    Burgess JAYSON Dare, MD [facility]  If 7PM-7AM, please contact night-coverage  2024/08/02, 12:31 PM

## 2024-08-15 DEATH — deceased

## 2024-09-01 ENCOUNTER — Ambulatory Visit: Admitting: Cardiology

## 2024-11-26 ENCOUNTER — Ambulatory Visit: Payer: 59 | Admitting: Neurology
